# Patient Record
Sex: Female | Born: 1935
Health system: Southern US, Community
[De-identification: ages and names within clinical notes are randomized; demographics above are authoritative.]

## PROBLEM LIST (undated history)

## (undated) DIAGNOSIS — M810 Age-related osteoporosis without current pathological fracture: Secondary | ICD-10-CM

## (undated) DIAGNOSIS — I499 Cardiac arrhythmia, unspecified: Secondary | ICD-10-CM

## (undated) DIAGNOSIS — G4730 Sleep apnea, unspecified: Secondary | ICD-10-CM

## (undated) DIAGNOSIS — K900 Celiac disease: Secondary | ICD-10-CM

## (undated) DIAGNOSIS — K219 Gastro-esophageal reflux disease without esophagitis: Secondary | ICD-10-CM

## (undated) DIAGNOSIS — M419 Scoliosis, unspecified: Secondary | ICD-10-CM

## (undated) DIAGNOSIS — I4892 Unspecified atrial flutter: Secondary | ICD-10-CM

## (undated) DIAGNOSIS — M9951 Intervertebral disc stenosis of neural canal of cervical region: Secondary | ICD-10-CM

## (undated) DIAGNOSIS — M353 Polymyalgia rheumatica: Secondary | ICD-10-CM

## (undated) DIAGNOSIS — I10 Essential (primary) hypertension: Secondary | ICD-10-CM

## (undated) HISTORY — DX: Polymyalgia rheumatica: M35.3

## (undated) HISTORY — PX: APPENDECTOMY: SHX54

## (undated) HISTORY — DX: Sleep apnea, unspecified: G47.30

## (undated) HISTORY — DX: Cardiac arrhythmia, unspecified: I49.9

## (undated) HISTORY — DX: Unspecified atrial flutter: I48.92

---

## 1998-03-09 ENCOUNTER — Ambulatory Visit (HOSPITAL_COMMUNITY): Admission: RE | Admit: 1998-03-09 | Discharge: 1998-03-09 | Payer: Self-pay | Admitting: Gastroenterology

## 1998-04-12 ENCOUNTER — Ambulatory Visit (HOSPITAL_COMMUNITY): Admission: RE | Admit: 1998-04-12 | Discharge: 1998-04-12 | Payer: Self-pay | Admitting: Obstetrics & Gynecology

## 1998-04-16 ENCOUNTER — Ambulatory Visit (HOSPITAL_COMMUNITY): Admission: RE | Admit: 1998-04-16 | Discharge: 1998-04-16 | Payer: Self-pay | Admitting: Obstetrics & Gynecology

## 1999-03-19 ENCOUNTER — Ambulatory Visit (HOSPITAL_COMMUNITY): Admission: RE | Admit: 1999-03-19 | Discharge: 1999-03-19 | Payer: Self-pay | Admitting: Obstetrics & Gynecology

## 1999-06-17 ENCOUNTER — Ambulatory Visit (HOSPITAL_COMMUNITY): Admission: RE | Admit: 1999-06-17 | Discharge: 1999-06-17 | Payer: Self-pay | Admitting: Gastroenterology

## 1999-06-17 ENCOUNTER — Encounter (INDEPENDENT_AMBULATORY_CARE_PROVIDER_SITE_OTHER): Payer: Self-pay | Admitting: Specialist

## 1999-11-05 ENCOUNTER — Other Ambulatory Visit: Admission: RE | Admit: 1999-11-05 | Discharge: 1999-11-05 | Payer: Self-pay | Admitting: Obstetrics & Gynecology

## 2000-04-08 ENCOUNTER — Ambulatory Visit (HOSPITAL_COMMUNITY): Admission: RE | Admit: 2000-04-08 | Discharge: 2000-04-08 | Payer: Self-pay | Admitting: Obstetrics & Gynecology

## 2000-04-08 ENCOUNTER — Encounter: Payer: Self-pay | Admitting: Obstetrics & Gynecology

## 2000-04-30 ENCOUNTER — Other Ambulatory Visit: Admission: RE | Admit: 2000-04-30 | Discharge: 2000-04-30 | Payer: Self-pay | Admitting: Obstetrics & Gynecology

## 2001-04-13 ENCOUNTER — Encounter: Payer: Self-pay | Admitting: Obstetrics & Gynecology

## 2001-04-13 ENCOUNTER — Ambulatory Visit (HOSPITAL_COMMUNITY): Admission: RE | Admit: 2001-04-13 | Discharge: 2001-04-13 | Payer: Self-pay | Admitting: Obstetrics & Gynecology

## 2001-04-16 ENCOUNTER — Encounter: Admission: RE | Admit: 2001-04-16 | Discharge: 2001-04-16 | Payer: Self-pay | Admitting: Family Medicine

## 2001-04-16 ENCOUNTER — Encounter: Payer: Self-pay | Admitting: Obstetrics & Gynecology

## 2001-06-01 ENCOUNTER — Encounter (INDEPENDENT_AMBULATORY_CARE_PROVIDER_SITE_OTHER): Payer: Self-pay | Admitting: Specialist

## 2001-06-01 ENCOUNTER — Ambulatory Visit (HOSPITAL_COMMUNITY): Admission: RE | Admit: 2001-06-01 | Discharge: 2001-06-01 | Payer: Self-pay | Admitting: Gastroenterology

## 2001-06-09 ENCOUNTER — Other Ambulatory Visit: Admission: RE | Admit: 2001-06-09 | Discharge: 2001-06-09 | Payer: Self-pay | Admitting: Obstetrics & Gynecology

## 2001-09-09 ENCOUNTER — Encounter: Admission: RE | Admit: 2001-09-09 | Discharge: 2001-12-08 | Payer: Self-pay | Admitting: *Deleted

## 2002-04-14 ENCOUNTER — Ambulatory Visit (HOSPITAL_COMMUNITY): Admission: RE | Admit: 2002-04-14 | Discharge: 2002-04-14 | Payer: Self-pay | Admitting: Obstetrics & Gynecology

## 2002-04-14 ENCOUNTER — Encounter: Payer: Self-pay | Admitting: Obstetrics & Gynecology

## 2002-10-12 ENCOUNTER — Encounter: Admission: RE | Admit: 2002-10-12 | Discharge: 2002-10-12 | Payer: Self-pay | Admitting: Orthopaedic Surgery

## 2002-10-12 ENCOUNTER — Encounter: Payer: Self-pay | Admitting: Orthopaedic Surgery

## 2002-10-27 ENCOUNTER — Encounter: Payer: Self-pay | Admitting: Orthopaedic Surgery

## 2002-10-27 ENCOUNTER — Encounter: Admission: RE | Admit: 2002-10-27 | Discharge: 2002-10-27 | Payer: Self-pay | Admitting: Orthopaedic Surgery

## 2002-11-28 ENCOUNTER — Encounter: Payer: Self-pay | Admitting: Orthopaedic Surgery

## 2002-11-28 ENCOUNTER — Encounter: Admission: RE | Admit: 2002-11-28 | Discharge: 2002-11-28 | Payer: Self-pay | Admitting: Orthopaedic Surgery

## 2002-12-06 ENCOUNTER — Ambulatory Visit (HOSPITAL_COMMUNITY): Admission: RE | Admit: 2002-12-06 | Discharge: 2002-12-06 | Payer: Self-pay | Admitting: Family Medicine

## 2002-12-06 ENCOUNTER — Encounter: Payer: Self-pay | Admitting: Family Medicine

## 2003-04-17 ENCOUNTER — Ambulatory Visit (HOSPITAL_COMMUNITY): Admission: RE | Admit: 2003-04-17 | Discharge: 2003-04-17 | Payer: Self-pay | Admitting: Family Medicine

## 2003-04-17 ENCOUNTER — Encounter: Payer: Self-pay | Admitting: Obstetrics & Gynecology

## 2003-05-02 ENCOUNTER — Other Ambulatory Visit: Admission: RE | Admit: 2003-05-02 | Discharge: 2003-05-02 | Payer: Self-pay | Admitting: Obstetrics & Gynecology

## 2004-04-22 ENCOUNTER — Ambulatory Visit (HOSPITAL_COMMUNITY): Admission: RE | Admit: 2004-04-22 | Discharge: 2004-04-22 | Payer: Self-pay | Admitting: Obstetrics & Gynecology

## 2004-09-03 ENCOUNTER — Encounter: Admission: RE | Admit: 2004-09-03 | Discharge: 2004-09-03 | Payer: Self-pay | Admitting: Specialist

## 2005-04-24 ENCOUNTER — Ambulatory Visit (HOSPITAL_COMMUNITY): Admission: RE | Admit: 2005-04-24 | Discharge: 2005-04-24 | Payer: Self-pay | Admitting: Obstetrics & Gynecology

## 2005-06-05 ENCOUNTER — Other Ambulatory Visit: Admission: RE | Admit: 2005-06-05 | Discharge: 2005-06-05 | Payer: Self-pay | Admitting: Obstetrics & Gynecology

## 2006-01-27 HISTORY — PX: CARDIAC CATHETERIZATION: SHX172

## 2006-01-28 ENCOUNTER — Encounter: Payer: Self-pay | Admitting: Obstetrics & Gynecology

## 2006-05-07 ENCOUNTER — Ambulatory Visit (HOSPITAL_COMMUNITY): Admission: RE | Admit: 2006-05-07 | Discharge: 2006-05-07 | Payer: Self-pay | Admitting: Obstetrics & Gynecology

## 2007-05-11 ENCOUNTER — Ambulatory Visit (HOSPITAL_COMMUNITY): Admission: RE | Admit: 2007-05-11 | Discharge: 2007-05-11 | Payer: Self-pay | Admitting: Internal Medicine

## 2008-02-01 ENCOUNTER — Emergency Department (HOSPITAL_COMMUNITY): Admission: EM | Admit: 2008-02-01 | Discharge: 2008-02-01 | Payer: Self-pay | Admitting: Emergency Medicine

## 2008-05-11 ENCOUNTER — Ambulatory Visit (HOSPITAL_COMMUNITY): Admission: RE | Admit: 2008-05-11 | Discharge: 2008-05-11 | Payer: Self-pay | Admitting: Obstetrics & Gynecology

## 2009-05-14 ENCOUNTER — Ambulatory Visit (HOSPITAL_COMMUNITY): Admission: RE | Admit: 2009-05-14 | Discharge: 2009-05-14 | Payer: Self-pay | Admitting: Obstetrics & Gynecology

## 2010-04-12 ENCOUNTER — Ambulatory Visit (HOSPITAL_COMMUNITY): Admission: RE | Admit: 2010-04-12 | Discharge: 2010-04-12 | Payer: Self-pay | Admitting: Internal Medicine

## 2010-05-10 ENCOUNTER — Inpatient Hospital Stay (HOSPITAL_COMMUNITY): Admission: EM | Admit: 2010-05-10 | Discharge: 2010-05-13 | Payer: Self-pay | Admitting: Emergency Medicine

## 2010-10-20 ENCOUNTER — Encounter: Payer: Self-pay | Admitting: Obstetrics & Gynecology

## 2010-12-13 LAB — DIFFERENTIAL
Basophils Absolute: 0.1 10*3/uL (ref 0.0–0.1)
Basophils Relative: 1 % (ref 0–1)
Eosinophils Absolute: 0 10*3/uL (ref 0.0–0.7)
Eosinophils Relative: 0 % (ref 0–5)
Lymphocytes Relative: 19 % (ref 12–46)
Lymphs Abs: 2.2 10*3/uL (ref 0.7–4.0)
Monocytes Absolute: 1.5 10*3/uL — ABNORMAL HIGH (ref 0.1–1.0)
Monocytes Relative: 13 % — ABNORMAL HIGH (ref 3–12)
Neutro Abs: 7.8 10*3/uL — ABNORMAL HIGH (ref 1.7–7.7)
Neutrophils Relative %: 67 % (ref 43–77)

## 2010-12-13 LAB — COMPREHENSIVE METABOLIC PANEL
ALT: 18 U/L (ref 0–35)
AST: 18 U/L (ref 0–37)
Albumin: 3.3 g/dL — ABNORMAL LOW (ref 3.5–5.2)
Alkaline Phosphatase: 66 U/L (ref 39–117)
BUN: 11 mg/dL (ref 6–23)
CO2: 24 mEq/L (ref 19–32)
Calcium: 8.7 mg/dL (ref 8.4–10.5)
Chloride: 102 mEq/L (ref 96–112)
Creatinine, Ser: 0.77 mg/dL (ref 0.4–1.2)
GFR calc Af Amer: >60 mL/min (ref >60)
GFR calc non Af Amer: >60 mL/min (ref >60)
Glucose, Bld: 160 mg/dL — ABNORMAL HIGH (ref 70–99)
Potassium: 4.2 mEq/L (ref 3.5–5.1)
Sodium: 134 mEq/L — ABNORMAL LOW (ref 135–145)
Total Bilirubin: 0.6 mg/dL (ref 0.3–1.2)
Total Protein: 7.2 g/dL (ref 6.0–8.3)

## 2010-12-13 LAB — CBC
HCT: 39.2 % (ref 36.0–46.0)
Hemoglobin: 13.4 g/dL (ref 12.0–15.0)
MCH: 30.8 pg (ref 26.0–34.0)
MCHC: 34.1 g/dL (ref 30.0–36.0)
MCV: 90.3 fL (ref 78.0–100.0)
Platelets: 349 10*3/uL (ref 150–400)
RBC: 4.34 MIL/uL (ref 3.87–5.11)
RDW: 12.9 % (ref 11.5–15.5)
WBC: 11.6 10*3/uL — ABNORMAL HIGH (ref 4.0–10.5)

## 2010-12-13 LAB — C-REACTIVE PROTEIN: CRP: 21.2 mg/dL — ABNORMAL HIGH (ref <0.6)

## 2010-12-13 LAB — ANA
Anti Nuclear Antibody(ANA): POSITIVE — AB
Anti Nuclear Antibody(ANA): POSITIVE — AB

## 2010-12-13 LAB — C-PEPTIDE: C-Peptide: 2.05 ng/mL (ref 0.80–3.90)

## 2010-12-13 LAB — ANTI-NUCLEAR AB-TITER (ANA TITER)
ANA Titer 1: NEGATIVE
ANA Titer 1: NEGATIVE

## 2010-12-13 LAB — SEDIMENTATION RATE: Sed Rate: 59 mm/hr — ABNORMAL HIGH (ref 0–22)

## 2010-12-13 LAB — CYCLIC CITRUL PEPTIDE ANTIBODY, IGG
Cyclic Citrullin Peptide Ab: <2 U/mL (ref 0.0–5.0)
Cyclic Citrullin Peptide Ab: <2 U/mL (ref 0.0–5.0)

## 2010-12-13 LAB — URIC ACID: Uric Acid, Serum: 6.8 mg/dL (ref 2.4–7.0)

## 2010-12-13 LAB — RHEUMATOID FACTOR: Rheumatoid fact SerPl-aCnc: 26 IU/mL — ABNORMAL HIGH (ref 0–20)

## 2011-01-22 ENCOUNTER — Other Ambulatory Visit: Payer: Self-pay | Admitting: Gastroenterology

## 2011-01-22 DIAGNOSIS — R1011 Right upper quadrant pain: Secondary | ICD-10-CM

## 2011-01-22 DIAGNOSIS — K900 Celiac disease: Secondary | ICD-10-CM

## 2011-01-22 DIAGNOSIS — R1031 Right lower quadrant pain: Secondary | ICD-10-CM

## 2011-01-29 ENCOUNTER — Ambulatory Visit
Admission: RE | Admit: 2011-01-29 | Discharge: 2011-01-29 | Disposition: A | Discharge status: Home / Self Care | Payer: Medicare Other | Source: Ambulatory Visit | Attending: Gastroenterology | Admitting: Gastroenterology

## 2011-01-29 ENCOUNTER — Other Ambulatory Visit: Payer: Self-pay

## 2011-01-29 DIAGNOSIS — R1031 Right lower quadrant pain: Secondary | ICD-10-CM

## 2011-01-29 DIAGNOSIS — R1011 Right upper quadrant pain: Secondary | ICD-10-CM

## 2011-01-29 DIAGNOSIS — K900 Celiac disease: Secondary | ICD-10-CM

## 2011-01-29 MED ORDER — IOHEXOL 300 MG/ML  SOLN
100.0000 mL | Freq: Once | INTRAMUSCULAR | Status: AC | PRN
  Administered 2011-01-29: 100 mL via INTRAVENOUS

## 2011-05-21 ENCOUNTER — Encounter (HOSPITAL_COMMUNITY): Payer: Medicare Other | Attending: Internal Medicine

## 2011-05-21 DIAGNOSIS — M810 Age-related osteoporosis without current pathological fracture: Secondary | ICD-10-CM | POA: Insufficient documentation

## 2011-10-02 DIAGNOSIS — G4710 Hypersomnia, unspecified: Secondary | ICD-10-CM | POA: Diagnosis not present

## 2011-10-02 DIAGNOSIS — G4730 Sleep apnea, unspecified: Secondary | ICD-10-CM | POA: Diagnosis not present

## 2011-10-02 DIAGNOSIS — R404 Transient alteration of awareness: Secondary | ICD-10-CM | POA: Diagnosis not present

## 2011-10-02 DIAGNOSIS — G4733 Obstructive sleep apnea (adult) (pediatric): Secondary | ICD-10-CM | POA: Diagnosis not present

## 2011-10-13 DIAGNOSIS — K900 Celiac disease: Secondary | ICD-10-CM | POA: Diagnosis not present

## 2011-10-13 DIAGNOSIS — K5730 Diverticulosis of large intestine without perforation or abscess without bleeding: Secondary | ICD-10-CM | POA: Diagnosis not present

## 2011-10-13 DIAGNOSIS — D126 Benign neoplasm of colon, unspecified: Secondary | ICD-10-CM | POA: Diagnosis not present

## 2011-10-13 DIAGNOSIS — Z86010 Personal history of colonic polyps: Secondary | ICD-10-CM | POA: Diagnosis not present

## 2011-10-13 DIAGNOSIS — R630 Anorexia: Secondary | ICD-10-CM | POA: Diagnosis not present

## 2011-10-13 DIAGNOSIS — Z1211 Encounter for screening for malignant neoplasm of colon: Secondary | ICD-10-CM | POA: Diagnosis not present

## 2011-10-13 DIAGNOSIS — R634 Abnormal weight loss: Secondary | ICD-10-CM | POA: Diagnosis not present

## 2011-10-13 DIAGNOSIS — K449 Diaphragmatic hernia without obstruction or gangrene: Secondary | ICD-10-CM | POA: Diagnosis not present

## 2011-10-15 DIAGNOSIS — G4733 Obstructive sleep apnea (adult) (pediatric): Secondary | ICD-10-CM | POA: Diagnosis not present

## 2011-10-27 DIAGNOSIS — K900 Celiac disease: Secondary | ICD-10-CM | POA: Diagnosis not present

## 2011-10-29 DIAGNOSIS — K900 Celiac disease: Secondary | ICD-10-CM | POA: Diagnosis not present

## 2011-10-29 DIAGNOSIS — M353 Polymyalgia rheumatica: Secondary | ICD-10-CM | POA: Diagnosis not present

## 2011-10-29 DIAGNOSIS — M810 Age-related osteoporosis without current pathological fracture: Secondary | ICD-10-CM | POA: Diagnosis not present

## 2011-10-29 DIAGNOSIS — Z0000 Encounter for general adult medical examination without abnormal findings: Secondary | ICD-10-CM | POA: Diagnosis not present

## 2011-11-05 DIAGNOSIS — Z0000 Encounter for general adult medical examination without abnormal findings: Secondary | ICD-10-CM | POA: Diagnosis not present

## 2011-11-05 DIAGNOSIS — R82998 Other abnormal findings in urine: Secondary | ICD-10-CM | POA: Diagnosis not present

## 2011-11-05 DIAGNOSIS — M4120 Other idiopathic scoliosis, site unspecified: Secondary | ICD-10-CM | POA: Diagnosis not present

## 2011-11-05 DIAGNOSIS — F411 Generalized anxiety disorder: Secondary | ICD-10-CM | POA: Diagnosis not present

## 2011-11-05 DIAGNOSIS — J984 Other disorders of lung: Secondary | ICD-10-CM | POA: Diagnosis not present

## 2011-11-07 DIAGNOSIS — I4729 Other ventricular tachycardia: Secondary | ICD-10-CM | POA: Diagnosis not present

## 2011-11-07 DIAGNOSIS — I472 Ventricular tachycardia: Secondary | ICD-10-CM | POA: Diagnosis not present

## 2011-11-12 DIAGNOSIS — R0982 Postnasal drip: Secondary | ICD-10-CM | POA: Diagnosis not present

## 2011-11-12 DIAGNOSIS — J342 Deviated nasal septum: Secondary | ICD-10-CM | POA: Diagnosis not present

## 2011-11-12 DIAGNOSIS — R51 Headache: Secondary | ICD-10-CM | POA: Diagnosis not present

## 2011-11-20 DIAGNOSIS — G4733 Obstructive sleep apnea (adult) (pediatric): Secondary | ICD-10-CM | POA: Diagnosis not present

## 2012-01-23 DIAGNOSIS — K219 Gastro-esophageal reflux disease without esophagitis: Secondary | ICD-10-CM | POA: Diagnosis not present

## 2012-01-23 DIAGNOSIS — M353 Polymyalgia rheumatica: Secondary | ICD-10-CM | POA: Diagnosis not present

## 2012-01-23 DIAGNOSIS — J984 Other disorders of lung: Secondary | ICD-10-CM | POA: Diagnosis not present

## 2012-01-23 DIAGNOSIS — F411 Generalized anxiety disorder: Secondary | ICD-10-CM | POA: Diagnosis not present

## 2012-01-27 ENCOUNTER — Other Ambulatory Visit: Payer: Self-pay | Admitting: Internal Medicine

## 2012-01-27 DIAGNOSIS — J984 Other disorders of lung: Secondary | ICD-10-CM

## 2012-01-28 ENCOUNTER — Ambulatory Visit
Admission: RE | Admit: 2012-01-28 | Discharge: 2012-01-28 | Disposition: A | Discharge status: Home / Self Care | Payer: Medicare Other | Source: Ambulatory Visit | Attending: Internal Medicine | Admitting: Internal Medicine

## 2012-01-28 DIAGNOSIS — R911 Solitary pulmonary nodule: Secondary | ICD-10-CM | POA: Diagnosis not present

## 2012-01-28 DIAGNOSIS — J984 Other disorders of lung: Secondary | ICD-10-CM

## 2012-01-28 MED ORDER — IOHEXOL 300 MG/ML  SOLN
75.0000 mL | Freq: Once | INTRAMUSCULAR | Status: AC | PRN
  Administered 2012-01-28: 75 mL via INTRAVENOUS

## 2012-02-02 DIAGNOSIS — K900 Celiac disease: Secondary | ICD-10-CM | POA: Diagnosis not present

## 2012-02-20 ENCOUNTER — Institutional Professional Consult (permissible substitution): Payer: Medicare Other | Admitting: Pulmonary Disease

## 2012-02-20 DIAGNOSIS — G479 Sleep disorder, unspecified: Secondary | ICD-10-CM | POA: Diagnosis not present

## 2012-02-20 DIAGNOSIS — M6740 Ganglion, unspecified site: Secondary | ICD-10-CM | POA: Diagnosis not present

## 2012-02-20 DIAGNOSIS — M4120 Other idiopathic scoliosis, site unspecified: Secondary | ICD-10-CM | POA: Diagnosis not present

## 2012-02-25 DIAGNOSIS — M810 Age-related osteoporosis without current pathological fracture: Secondary | ICD-10-CM | POA: Diagnosis not present

## 2012-02-25 DIAGNOSIS — IMO0002 Reserved for concepts with insufficient information to code with codable children: Secondary | ICD-10-CM | POA: Diagnosis not present

## 2012-02-25 DIAGNOSIS — M353 Polymyalgia rheumatica: Secondary | ICD-10-CM | POA: Diagnosis not present

## 2012-03-05 DIAGNOSIS — M542 Cervicalgia: Secondary | ICD-10-CM | POA: Diagnosis not present

## 2012-03-18 DIAGNOSIS — G4733 Obstructive sleep apnea (adult) (pediatric): Secondary | ICD-10-CM | POA: Diagnosis not present

## 2012-03-18 DIAGNOSIS — I498 Other specified cardiac arrhythmias: Secondary | ICD-10-CM | POA: Diagnosis not present

## 2012-03-23 DIAGNOSIS — J0100 Acute maxillary sinusitis, unspecified: Secondary | ICD-10-CM | POA: Diagnosis not present

## 2012-04-08 DIAGNOSIS — G4733 Obstructive sleep apnea (adult) (pediatric): Secondary | ICD-10-CM | POA: Diagnosis not present

## 2012-04-08 DIAGNOSIS — G4710 Hypersomnia, unspecified: Secondary | ICD-10-CM | POA: Diagnosis not present

## 2012-04-08 DIAGNOSIS — G4730 Sleep apnea, unspecified: Secondary | ICD-10-CM | POA: Diagnosis not present

## 2012-04-08 DIAGNOSIS — L608 Other nail disorders: Secondary | ICD-10-CM | POA: Diagnosis not present

## 2012-04-08 DIAGNOSIS — L851 Acquired keratosis [keratoderma] palmaris et plantaris: Secondary | ICD-10-CM | POA: Diagnosis not present

## 2012-04-08 DIAGNOSIS — L821 Other seborrheic keratosis: Secondary | ICD-10-CM | POA: Diagnosis not present

## 2012-04-08 DIAGNOSIS — D485 Neoplasm of uncertain behavior of skin: Secondary | ICD-10-CM | POA: Diagnosis not present

## 2012-04-08 DIAGNOSIS — M7130 Other bursal cyst, unspecified site: Secondary | ICD-10-CM | POA: Diagnosis not present

## 2012-04-23 DIAGNOSIS — M353 Polymyalgia rheumatica: Secondary | ICD-10-CM | POA: Diagnosis not present

## 2012-04-23 DIAGNOSIS — M4120 Other idiopathic scoliosis, site unspecified: Secondary | ICD-10-CM | POA: Diagnosis not present

## 2012-04-23 DIAGNOSIS — J329 Chronic sinusitis, unspecified: Secondary | ICD-10-CM | POA: Diagnosis not present

## 2012-05-06 DIAGNOSIS — H40019 Open angle with borderline findings, low risk, unspecified eye: Secondary | ICD-10-CM | POA: Diagnosis not present

## 2012-05-06 DIAGNOSIS — H2510 Age-related nuclear cataract, unspecified eye: Secondary | ICD-10-CM | POA: Diagnosis not present

## 2012-05-06 DIAGNOSIS — H43399 Other vitreous opacities, unspecified eye: Secondary | ICD-10-CM | POA: Diagnosis not present

## 2012-05-06 DIAGNOSIS — H35729 Serous detachment of retinal pigment epithelium, unspecified eye: Secondary | ICD-10-CM | POA: Diagnosis not present

## 2012-05-06 DIAGNOSIS — H35369 Drusen (degenerative) of macula, unspecified eye: Secondary | ICD-10-CM | POA: Diagnosis not present

## 2012-05-11 DIAGNOSIS — J3489 Other specified disorders of nose and nasal sinuses: Secondary | ICD-10-CM | POA: Diagnosis not present

## 2012-05-11 DIAGNOSIS — R52 Pain, unspecified: Secondary | ICD-10-CM | POA: Diagnosis not present

## 2012-06-14 DIAGNOSIS — R002 Palpitations: Secondary | ICD-10-CM | POA: Diagnosis not present

## 2012-06-14 DIAGNOSIS — I4729 Other ventricular tachycardia: Secondary | ICD-10-CM | POA: Diagnosis not present

## 2012-06-14 DIAGNOSIS — I472 Ventricular tachycardia: Secondary | ICD-10-CM | POA: Diagnosis not present

## 2012-06-18 DIAGNOSIS — F430 Acute stress reaction: Secondary | ICD-10-CM | POA: Diagnosis not present

## 2012-06-18 DIAGNOSIS — M810 Age-related osteoporosis without current pathological fracture: Secondary | ICD-10-CM | POA: Diagnosis not present

## 2012-06-23 DIAGNOSIS — Z79899 Other long term (current) drug therapy: Secondary | ICD-10-CM | POA: Diagnosis not present

## 2012-07-01 DIAGNOSIS — H5319 Other subjective visual disturbances: Secondary | ICD-10-CM | POA: Diagnosis not present

## 2012-07-01 DIAGNOSIS — Z79899 Other long term (current) drug therapy: Secondary | ICD-10-CM | POA: Diagnosis not present

## 2012-07-01 DIAGNOSIS — R51 Headache: Secondary | ICD-10-CM | POA: Diagnosis not present

## 2012-07-01 DIAGNOSIS — M353 Polymyalgia rheumatica: Secondary | ICD-10-CM | POA: Diagnosis not present

## 2012-07-05 DIAGNOSIS — M542 Cervicalgia: Secondary | ICD-10-CM | POA: Diagnosis not present

## 2012-07-05 DIAGNOSIS — M353 Polymyalgia rheumatica: Secondary | ICD-10-CM | POA: Diagnosis not present

## 2012-07-05 DIAGNOSIS — IMO0001 Reserved for inherently not codable concepts without codable children: Secondary | ICD-10-CM | POA: Diagnosis not present

## 2012-07-12 ENCOUNTER — Encounter (HOSPITAL_COMMUNITY): Payer: Self-pay

## 2012-07-12 ENCOUNTER — Encounter (HOSPITAL_COMMUNITY)
Admission: RE | Admit: 2012-07-12 | Discharge: 2012-07-12 | Disposition: A | Discharge status: Home / Self Care | Payer: Medicare Other | Source: Ambulatory Visit | Attending: Family Medicine | Admitting: Family Medicine

## 2012-07-12 DIAGNOSIS — M810 Age-related osteoporosis without current pathological fracture: Secondary | ICD-10-CM | POA: Insufficient documentation

## 2012-07-12 HISTORY — DX: Scoliosis, unspecified: M41.9

## 2012-07-12 HISTORY — DX: Cardiac arrhythmia, unspecified: I49.9

## 2012-07-12 HISTORY — DX: Intervertebral disc stenosis of neural canal of cervical region: M99.51

## 2012-07-12 HISTORY — DX: Age-related osteoporosis without current pathological fracture: M81.0

## 2012-07-12 HISTORY — DX: Gastro-esophageal reflux disease without esophagitis: K21.9

## 2012-07-12 HISTORY — DX: Celiac disease: K90.0

## 2012-07-12 MED ORDER — ZOLEDRONIC ACID 5 MG/100ML IV SOLN
5.0000 mg | Freq: Once | INTRAVENOUS | Status: AC
  Administered 2012-07-12: 5 mg via INTRAVENOUS
  Filled 2012-07-12: qty 100

## 2012-07-12 MED ORDER — SODIUM CHLORIDE 0.9 % IV SOLN
INTRAVENOUS | Status: DC
  Administered 2012-07-12: 10:00:00 via INTRAVENOUS

## 2012-07-13 DIAGNOSIS — J310 Chronic rhinitis: Secondary | ICD-10-CM | POA: Diagnosis not present

## 2012-07-23 DIAGNOSIS — M810 Age-related osteoporosis without current pathological fracture: Secondary | ICD-10-CM | POA: Diagnosis not present

## 2012-07-23 DIAGNOSIS — F411 Generalized anxiety disorder: Secondary | ICD-10-CM | POA: Diagnosis not present

## 2012-07-23 DIAGNOSIS — J3489 Other specified disorders of nose and nasal sinuses: Secondary | ICD-10-CM | POA: Diagnosis not present

## 2012-07-23 DIAGNOSIS — R252 Cramp and spasm: Secondary | ICD-10-CM | POA: Diagnosis not present

## 2012-08-12 DIAGNOSIS — H40019 Open angle with borderline findings, low risk, unspecified eye: Secondary | ICD-10-CM | POA: Diagnosis not present

## 2012-08-16 DIAGNOSIS — Z130 Encounter for screening for diseases of the blood and blood-forming organs and certain disorders involving the immune mechanism: Secondary | ICD-10-CM | POA: Diagnosis not present

## 2012-08-16 DIAGNOSIS — B373 Candidiasis of vulva and vagina: Secondary | ICD-10-CM | POA: Diagnosis not present

## 2012-08-16 DIAGNOSIS — Z124 Encounter for screening for malignant neoplasm of cervix: Secondary | ICD-10-CM | POA: Diagnosis not present

## 2012-08-16 DIAGNOSIS — Z1231 Encounter for screening mammogram for malignant neoplasm of breast: Secondary | ICD-10-CM | POA: Diagnosis not present

## 2012-08-16 DIAGNOSIS — Z01419 Encounter for gynecological examination (general) (routine) without abnormal findings: Secondary | ICD-10-CM | POA: Diagnosis not present

## 2012-09-30 DIAGNOSIS — M5137 Other intervertebral disc degeneration, lumbosacral region: Secondary | ICD-10-CM | POA: Diagnosis not present

## 2012-09-30 DIAGNOSIS — G894 Chronic pain syndrome: Secondary | ICD-10-CM | POA: Diagnosis not present

## 2012-10-04 DIAGNOSIS — R151 Fecal smearing: Secondary | ICD-10-CM | POA: Diagnosis not present

## 2012-10-04 DIAGNOSIS — R152 Fecal urgency: Secondary | ICD-10-CM | POA: Diagnosis not present

## 2012-10-04 DIAGNOSIS — K648 Other hemorrhoids: Secondary | ICD-10-CM | POA: Diagnosis not present

## 2012-10-08 DIAGNOSIS — H6980 Other specified disorders of Eustachian tube, unspecified ear: Secondary | ICD-10-CM | POA: Diagnosis not present

## 2012-10-08 DIAGNOSIS — G479 Sleep disorder, unspecified: Secondary | ICD-10-CM | POA: Diagnosis not present

## 2012-10-08 DIAGNOSIS — M810 Age-related osteoporosis without current pathological fracture: Secondary | ICD-10-CM | POA: Diagnosis not present

## 2012-10-08 DIAGNOSIS — M4120 Other idiopathic scoliosis, site unspecified: Secondary | ICD-10-CM | POA: Diagnosis not present

## 2012-10-08 DIAGNOSIS — F411 Generalized anxiety disorder: Secondary | ICD-10-CM | POA: Diagnosis not present

## 2012-10-08 DIAGNOSIS — M542 Cervicalgia: Secondary | ICD-10-CM | POA: Diagnosis not present

## 2012-10-11 DIAGNOSIS — IMO0001 Reserved for inherently not codable concepts without codable children: Secondary | ICD-10-CM | POA: Diagnosis not present

## 2012-10-11 DIAGNOSIS — IMO0002 Reserved for concepts with insufficient information to code with codable children: Secondary | ICD-10-CM | POA: Diagnosis not present

## 2012-10-11 DIAGNOSIS — M353 Polymyalgia rheumatica: Secondary | ICD-10-CM | POA: Diagnosis not present

## 2012-10-11 DIAGNOSIS — M810 Age-related osteoporosis without current pathological fracture: Secondary | ICD-10-CM | POA: Diagnosis not present

## 2012-10-22 DIAGNOSIS — G4733 Obstructive sleep apnea (adult) (pediatric): Secondary | ICD-10-CM | POA: Diagnosis not present

## 2012-10-22 DIAGNOSIS — R002 Palpitations: Secondary | ICD-10-CM | POA: Diagnosis not present

## 2012-11-29 ENCOUNTER — Emergency Department (HOSPITAL_COMMUNITY): Payer: Medicare Other

## 2012-11-29 ENCOUNTER — Inpatient Hospital Stay (HOSPITAL_COMMUNITY)
Admission: EM | Admit: 2012-11-29 | Discharge: 2012-11-30 | DRG: 310 | Disposition: A | Discharge status: Home / Self Care | Payer: Medicare Other | Attending: Internal Medicine | Admitting: Internal Medicine

## 2012-11-29 ENCOUNTER — Encounter (HOSPITAL_COMMUNITY): Payer: Self-pay | Admitting: Emergency Medicine

## 2012-11-29 DIAGNOSIS — I4891 Unspecified atrial fibrillation: Secondary | ICD-10-CM | POA: Diagnosis present

## 2012-11-29 DIAGNOSIS — M4802 Spinal stenosis, cervical region: Secondary | ICD-10-CM | POA: Diagnosis present

## 2012-11-29 DIAGNOSIS — J9819 Other pulmonary collapse: Secondary | ICD-10-CM | POA: Diagnosis not present

## 2012-11-29 DIAGNOSIS — K219 Gastro-esophageal reflux disease without esophagitis: Secondary | ICD-10-CM | POA: Diagnosis present

## 2012-11-29 DIAGNOSIS — IMO0002 Reserved for concepts with insufficient information to code with codable children: Secondary | ICD-10-CM

## 2012-11-29 DIAGNOSIS — R0789 Other chest pain: Secondary | ICD-10-CM | POA: Diagnosis not present

## 2012-11-29 DIAGNOSIS — M4120 Other idiopathic scoliosis, site unspecified: Secondary | ICD-10-CM | POA: Diagnosis present

## 2012-11-29 DIAGNOSIS — IMO0001 Reserved for inherently not codable concepts without codable children: Secondary | ICD-10-CM | POA: Diagnosis present

## 2012-11-29 DIAGNOSIS — M810 Age-related osteoporosis without current pathological fracture: Secondary | ICD-10-CM | POA: Diagnosis present

## 2012-11-29 DIAGNOSIS — I4892 Unspecified atrial flutter: Secondary | ICD-10-CM | POA: Diagnosis not present

## 2012-11-29 DIAGNOSIS — M797 Fibromyalgia: Secondary | ICD-10-CM

## 2012-11-29 DIAGNOSIS — I484 Atypical atrial flutter: Secondary | ICD-10-CM | POA: Diagnosis present

## 2012-11-29 DIAGNOSIS — I498 Other specified cardiac arrhythmias: Secondary | ICD-10-CM | POA: Diagnosis not present

## 2012-11-29 DIAGNOSIS — R6889 Other general symptoms and signs: Secondary | ICD-10-CM | POA: Diagnosis not present

## 2012-11-29 LAB — CBC
HCT: 38.2 % (ref 36.0–46.0)
Hemoglobin: 13.5 g/dL (ref 12.0–15.0)
MCH: 31.5 pg (ref 26.0–34.0)
MCHC: 35.3 g/dL (ref 30.0–36.0)
MCV: 89.3 fL (ref 78.0–100.0)
Platelets: 264 10*3/uL (ref 150–400)
RBC: 4.28 MIL/uL (ref 3.87–5.11)
RDW: 13.7 % (ref 11.5–15.5)
WBC: 11.4 10*3/uL — ABNORMAL HIGH (ref 4.0–10.5)

## 2012-11-29 LAB — CBC WITH DIFFERENTIAL/PLATELET
Basophils Absolute: 0 10*3/uL (ref 0.0–0.1)
Basophils Relative: 0 % (ref 0–1)
Eosinophils Absolute: 0 10*3/uL (ref 0.0–0.7)
Eosinophils Relative: 0 % (ref 0–5)
HCT: 37.1 % (ref 36.0–46.0)
Hemoglobin: 13 g/dL (ref 12.0–15.0)
Lymphocytes Relative: 17 % (ref 12–46)
Lymphs Abs: 1.9 10*3/uL (ref 0.7–4.0)
MCH: 31.3 pg (ref 26.0–34.0)
MCHC: 35 g/dL (ref 30.0–36.0)
MCV: 89.4 fL (ref 78.0–100.0)
Monocytes Absolute: 0.8 10*3/uL (ref 0.1–1.0)
Monocytes Relative: 7 % (ref 3–12)
Neutro Abs: 8.4 10*3/uL — ABNORMAL HIGH (ref 1.7–7.7)
Neutrophils Relative %: 75 % (ref 43–77)
Platelets: 258 10*3/uL (ref 150–400)
RBC: 4.15 MIL/uL (ref 3.87–5.11)
RDW: 13.6 % (ref 11.5–15.5)
WBC: 11.2 10*3/uL — ABNORMAL HIGH (ref 4.0–10.5)

## 2012-11-29 LAB — BASIC METABOLIC PANEL
BUN: 14 mg/dL (ref 6–23)
CO2: 26 mEq/L (ref 19–32)
Calcium: 10.2 mg/dL (ref 8.4–10.5)
Chloride: 101 mEq/L (ref 96–112)
Creatinine, Ser: 0.78 mg/dL (ref 0.50–1.10)
GFR calc Af Amer: >90 mL/min (ref >90)
GFR calc non Af Amer: 78 mL/min — ABNORMAL LOW (ref >90)
Glucose, Bld: 106 mg/dL — ABNORMAL HIGH (ref 70–99)
Potassium: 4.1 mEq/L (ref 3.5–5.1)
Sodium: 139 mEq/L (ref 135–145)

## 2012-11-29 LAB — TROPONIN I
Troponin I: <0.3 ng/mL (ref <0.30)
Troponin I: <0.3 ng/mL (ref <0.30)

## 2012-11-29 LAB — CREATININE, SERUM
Creatinine, Ser: 0.79 mg/dL (ref 0.50–1.10)
GFR calc Af Amer: >90 mL/min (ref >90)
GFR calc non Af Amer: 78 mL/min — ABNORMAL LOW (ref >90)

## 2012-11-29 LAB — PRO B NATRIURETIC PEPTIDE: Pro B Natriuretic peptide (BNP): 178.6 pg/mL (ref 0–450)

## 2012-11-29 MED ORDER — METOPROLOL TARTRATE 25 MG PO TABS
25.0000 mg | ORAL_TABLET | Freq: Two times a day (BID) | ORAL | Status: DC
  Administered 2012-11-29 – 2012-11-30 (×2): 25 mg via ORAL
  Filled 2012-11-29 (×3): qty 1

## 2012-11-29 MED ORDER — SODIUM CHLORIDE 0.9 % IJ SOLN
3.0000 mL | Freq: Two times a day (BID) | INTRAMUSCULAR | Status: DC
  Administered 2012-11-29: 3 mL via INTRAVENOUS

## 2012-11-29 MED ORDER — DOCUSATE SODIUM 100 MG PO CAPS
100.0000 mg | ORAL_CAPSULE | Freq: Two times a day (BID) | ORAL | Status: DC
  Administered 2012-11-29: 100 mg via ORAL
  Filled 2012-11-29 (×3): qty 1

## 2012-11-29 MED ORDER — ZOLPIDEM TARTRATE 5 MG PO TABS
5.0000 mg | ORAL_TABLET | Freq: Every evening | ORAL | Status: DC | PRN
  Filled 2012-11-29: qty 1

## 2012-11-29 MED ORDER — ASPIRIN EC 325 MG PO TBEC
325.0000 mg | DELAYED_RELEASE_TABLET | Freq: Every day | ORAL | Status: DC
  Administered 2012-11-30: 325 mg via ORAL
  Filled 2012-11-29: qty 1

## 2012-11-29 MED ORDER — PREDNISONE 20 MG PO TABS
20.0000 mg | ORAL_TABLET | Freq: Two times a day (BID) | ORAL | Status: DC
  Administered 2012-11-30: 20 mg via ORAL
  Filled 2012-11-29 (×3): qty 1

## 2012-11-29 MED ORDER — SODIUM CHLORIDE 0.9 % IJ SOLN: 3.0000 mL | INTRAMUSCULAR | Status: DC | PRN

## 2012-11-29 MED ORDER — SODIUM CHLORIDE 0.9 % IJ SOLN: 3.0000 mL | Freq: Two times a day (BID) | INTRAMUSCULAR | Status: DC

## 2012-11-29 MED ORDER — SODIUM CHLORIDE 0.9 % IV SOLN: 250.0000 mL | INTRAVENOUS | Status: DC | PRN

## 2012-11-29 MED ORDER — ENOXAPARIN SODIUM 40 MG/0.4ML ~~LOC~~ SOLN
40.0000 mg | SUBCUTANEOUS | Status: DC
  Administered 2012-11-29: 40 mg via SUBCUTANEOUS
  Filled 2012-11-29 (×2): qty 0.4

## 2012-11-29 NOTE — H&P (Signed)
[facility] History and Physical  Janet Nguyen JFH:[id number] DOB: 10-Aug-1932    PCP:   Gus Height, MD   Chief Complaint: chest tightness and palpitation.  HPI: Janet Nguyen is an 79 y.o. female with hx of "irregular heart beat" ? PAF, celiac disease, fibromyalgia on chronic intermittent steroid, scoliosis, GERD, but no known CAD, presents to the ER via ambulance with complaining of palpitation.  She felt her heart was pounding.  She denied SOB, but admitted to having substernal chest pressure.  She said it felt like an "elephant" on her chest.  There were no nausea or vomiting.  She was found to be in aflutter with 2:1 block at rate of 150.  She spontaneously converted to NSR when she was placed on the ambulance.  Her tachyarythmia did not recur in the ER.  Her EKG showed NSR with no acute ST-T changes.  CXR was negative.  Her troponin was negative and renal fx tests were unremarkable.  She has no anemia.  Hospitalist was asked to admit her for chest pain and aflutter with RVR.  Rewiew of Systems:  Constitutional: Negative for malaise, fever and chills. No significant weight loss or weight gain Eyes: Negative for eye pain, redness and discharge, diplopia, visual changes, or flashes of light. ENMT: Negative for ear pain, hoarseness, nasal congestion, sinus pressure and sore throat. No headaches; tinnitus, drooling, or problem swallowing. Cardiovascular: Negative for  diaphoresis, dyspnea and peripheral edema. ; No orthopnea, PND Respiratory: Negative for cough, hemoptysis, wheezing and stridor. No pleuritic chestpain. Gastrointestinal: Negative for nausea, vomiting, diarrhea, constipation, abdominal pain, melena, blood in stool, hematemesis, jaundice and rectal bleeding.    Genitourinary: Negative for frequency, dysuria, incontinence,flank pain and hematuria; Musculoskeletal: Negative for back pain and neck pain. Negative for swelling and trauma.;  Skin: . Negative for pruritus,  rash, abrasions, bruising and skin lesion.; ulcerations Neuro: Negative for headache, lightheadedness and neck stiffness. Negative for weakness, altered level of consciousness , altered mental status, extremity weakness, burning feet, involuntary movement, seizure and syncope.  Psych: negative for anxiety, depression, insomnia, tearfulness, panic attacks, hallucinations, paranoia, suicidal or homicidal ideation    Past Medical History  Diagnosis Date  . Irregular heart beat   . Celiac disease   . GERD (gastroesophageal reflux disease)   . Osteoporosis   . Scoliosis   . Intervertebral disc stenosis of neural canal of cervical region     Past Surgical History  Procedure Laterality Date  . Appendectomy      ruptured at age 63 and had surgery    Medications:  HOME MEDS: Prior to Admission medications   Medication Sig Start Date End Date Taking? Authorizing Provider  metoprolol tartrate (LOPRESSOR) 25 MG tablet Take 25 mg by mouth 2 (two) times daily.   Yes Historical Provider, MD  Multiple Vitamin (MULTIVITAMIN WITH MINERALS) TABS Take 1 tablet by mouth daily.   Yes Historical Provider, MD  predniSONE (DELTASONE) 20 MG tablet Take 20 mg by mouth 2 (two) times daily.   Yes Historical Provider, MD  zolpidem (AMBIEN) 5 MG tablet Take 5 mg by mouth at bedtime as needed for sleep.   Yes Historical Provider, MD     Allergies:  Allergies  Allergen Reactions  . Codeine Nausea Only  . Gluten Meal     Unknown    Social History:   reports that she has never smoked. She does not have any smokeless tobacco history on file. She reports that she drinks about 0.5 ounces of  alcohol per week. She reports that she does not use illicit drugs.  Family History: No family history on file.   Physical Exam: Filed Vitals:   11/29/12 1704 11/29/12 1716 11/29/12 1923  BP:  147/81 169/77  Pulse:  73 76  Temp:  98.8 F (37.1 C) 98.5 F (36.9 C)  TempSrc:  Oral Oral  Resp:  15 14  SpO2: 96%   97%   Blood pressure 169/77, pulse 76, temperature 98.5 F (36.9 C), temperature source Oral, resp. rate 14, SpO2 97.00%.  GEN:  Pleasant  patient lying in the stretcher in no acute distress; cooperative with exam. PSYCH:  alert and oriented x4; does not appear anxious or depressed; affect is appropriate. HEENT: Mucous membranes pink and anicteric; PERRLA; EOM intact; no cervical lymphadenopathy nor thyromegaly or carotid bruit; no JVD; There were no stridor. Neck is very supple. Breasts:: Not examined CHEST WALL: No tenderness CHEST: Normal respiration, clear to auscultation bilaterally.  HEART: Regular rate and rhythm.  There are no murmur, rub, or gallops.   BACK: No kyphosis or scoliosis; no CVA tenderness ABDOMEN: soft and non-tender; no masses, no organomegaly, normal abdominal bowel sounds; no pannus; no intertriginous candida. There is no rebound and no distention. Rectal Exam: Not done EXTREMITIES: No bone or joint deformity; age-appropriate arthropathy of the hands and knees; no edema; no ulcerations.  There is no calf tenderness. Genitalia: not examined PULSES: 2+ and symmetric SKIN: Normal hydration no rash or ulceration CNS: Cranial nerves 2-12 grossly intact no focal lateralizing neurologic deficit.  Speech is fluent; uvula elevated with phonation, facial symmetry and tongue midline. DTR are normal bilaterally, cerebella exam is intact, barbinski is negative and strengths are equaled bilaterally.  No sensory loss.   Labs on Admission:  Basic Metabolic Panel:  Recent Labs Lab 11/29/12 1736  NA 139  K 4.1  CL 101  CO2 26  GLUCOSE 106*  BUN 14  CREATININE 0.78  CALCIUM 10.2   Liver Function Tests: No results found for this basename: AST, ALT, ALKPHOS, BILITOT, PROT, ALBUMIN,  in the last 168 hours No results found for this basename: LIPASE, AMYLASE,  in the last 168 hours No results found for this basename: AMMONIA,  in the last 168 hours CBC:  Recent Labs Lab  11/29/12 1736  WBC 11.2*  NEUTROABS 8.4*  HGB 13.0  HCT 37.1  MCV 89.4  PLT 258   Cardiac Enzymes:  Recent Labs Lab 11/29/12 1736  TROPONINI <0.30    CBG: No results found for this basename: GLUCAP,  in the last 168 hours   Radiological Exams on Admission: Dg Chest Portable 1 View  11/29/2012  *RADIOLOGY REPORT*  Clinical Data: Heart palpitations.  PORTABLE CHEST - 1 VIEW  Comparison: CT chest 01/28/2012.  Findings: Lung volumes are low with mild basilar atelectasis. Heart size is normal.  No pneumothorax or pleural effusion.  Convex right scoliosis noted.  IMPRESSION: No acute disease.   Original Report Authenticated By: Orlean Patten, M.D.     EKG: Independently reviewed. SR without any acute ST-T changes.   Assessment/Plan Present on Admission:  . Atrial flutter with rapid ventricular response . Chest pain, atypical . Fibromyalgia syndrome Chronic steroid use.  PLAN:  She had PAF or flutter and was in RVR, but she is now in NSR.  Her CHADS score is low (1=age 80), and I will place her on ASA.  She said she bruises easily.  She is on low dose betablocker and I will continue  it as well.  She will need an ECHO, and be r/out with serial troponins.  She is on chronic steroid and we'll continue it.  She has no signs and symptoms of acute adrenal insufficiency.  She did have chest pain when her HR was 150, so she will need a stress test as well.  She is stable, full code, and will be admitted to [facility] service.  Thank you for allowing me to partake in the care of your patient.  Other plans as per orders.  Code Status: FULL Haskel Khan, MD. [facility] Pager [phone] 7pm to 7am.  11/29/2012, 8:26 PM

## 2012-11-29 NOTE — ED Notes (Signed)
EMS called for palpations. EMS arrived HR 155 converted when EMS moved patient to stretcher. Currently chest pressure 4/10.

## 2012-11-29 NOTE — ED Provider Notes (Signed)
I saw and evaluated the patient, reviewed the resident's note and I agree with the findings and plan. I reviewed and interpreted the EKG during the patient's evaluation in the ED and agree with the resident's interpretation.  Pt's EMS EKG showed a flutter with 2:1 block.  Suspect her pain may have been related to her tachycardia.  Plan on admission for further evaluation.  Cardiac monitoring.  Kathalene Frames, MD 11/30/12 0000

## 2012-11-29 NOTE — ED Notes (Signed)
EKG completed 1710

## 2012-11-29 NOTE — ED Provider Notes (Signed)
History     CSN: [id number]  Arrival date & time 11/29/12  1704   First MD Initiated Contact with Patient 11/29/12 1705      Chief Complaint  Patient presents with  . Palpitations    (Consider location/radiation/quality/duration/timing/severity/associated sxs/prior treatment) Patient is a 79 y.o. female presenting with palpitations. The history is provided by the patient. No language interpreter was used.  Palpitations  This is a new problem. The current episode started less than 1 hour ago. The problem occurs constantly. The problem has been resolved. The problem is associated with an unknown factor. On average, each episode lasts 40 minutes. Associated symptoms include chest pain and chest pressure. Pertinent negatives include no diaphoresis, no fever, no numbness, no abdominal pain, no nausea, no vomiting, no headaches, no back pain, no leg pain, no dizziness, no weakness, no cough and no shortness of breath. She has tried nothing for the symptoms. The treatment provided significant relief. There are no known risk factors.    Past Medical History  Diagnosis Date  . Irregular heart beat   . Celiac disease   . GERD (gastroesophageal reflux disease)   . Osteoporosis   . Scoliosis   . Intervertebral disc stenosis of neural canal of cervical region     Past Surgical History  Procedure Laterality Date  . Appendectomy      ruptured at age 25 and had surgery    No family history on file.  History  Substance Use Topics  . Smoking status: Never Smoker   . Smokeless tobacco: Not on file  . Alcohol Use: 0.5 oz/week    1 drink(s) per week    OB History   Grav Para Term Preterm Abortions TAB SAB Ect Mult Living                  Review of Systems  Constitutional: Negative for fever, chills, diaphoresis, activity change, appetite change and fatigue.  HENT: Negative for congestion, sore throat, facial swelling, rhinorrhea, neck pain and neck stiffness.   Eyes: Negative for  photophobia and discharge.  Respiratory: Negative for cough, chest tightness and shortness of breath.   Cardiovascular: Positive for chest pain and palpitations. Negative for leg swelling.  Gastrointestinal: Negative for nausea, vomiting, abdominal pain and diarrhea.  Endocrine: Negative for polydipsia and polyuria.  Genitourinary: Negative for dysuria, frequency, difficulty urinating and pelvic pain.  Musculoskeletal: Negative for back pain and arthralgias.  Skin: Negative for color change and wound.  Allergic/Immunologic: Negative for immunocompromised state.  Neurological: Negative for dizziness, facial asymmetry, weakness, numbness and headaches.  Hematological: Does not bruise/bleed easily.  Psychiatric/Behavioral: Negative for confusion and agitation.    Allergies  Codeine and Gluten meal  Home Medications   No current outpatient prescriptions on file.  BP 165/86  Pulse 71  Temp(Src) 98.1 F (36.7 C) (Oral)  Resp 16  Ht 5' 3"  (1.6 m)  Wt 125 lb 1.6 oz (56.745 kg)  BMI 22.17 kg/m2  SpO2 97%  Physical Exam  Constitutional: She is oriented to person, place, and time. She appears well-developed and well-nourished. No distress.  HENT:  Head: Normocephalic and atraumatic.  Mouth/Throat: No oropharyngeal exudate.  Eyes: Pupils are equal, round, and reactive to light.  Neck: Normal range of motion. Neck supple.  Cardiovascular: Normal rate, regular rhythm and normal heart sounds.  Exam reveals no gallop and no friction rub.   No murmur heard. Pulmonary/Chest: Effort normal and breath sounds normal. No respiratory distress. She has no wheezes. She  has no rales.  Abdominal: Soft. Bowel sounds are normal. She exhibits no distension and no mass. There is no tenderness. There is no rebound and no guarding.  Musculoskeletal: Normal range of motion. She exhibits no edema and no tenderness.  Neurological: She is alert and oriented to person, place, and time.  Skin: Skin is warm and  dry.  Psychiatric: She has a normal mood and affect.    ED Course  Procedures (including critical care time)  Labs Reviewed  CBC WITH DIFFERENTIAL - Abnormal; Notable for the following:    WBC 11.2 (*)    Neutro Abs 8.4 (*)    All other components within normal limits  BASIC METABOLIC PANEL - Abnormal; Notable for the following:    Glucose, Bld 106 (*)    GFR calc non Af Amer 78 (*)    All other components within normal limits  CBC - Abnormal; Notable for the following:    WBC 11.4 (*)    All other components within normal limits  TROPONIN I  PRO B NATRIURETIC PEPTIDE  CREATININE, SERUM  TSH  TROPONIN I  TROPONIN I  TROPONIN I   Dg Chest Portable 1 View  11/29/2012  *RADIOLOGY REPORT*  Clinical Data: Heart palpitations.  PORTABLE CHEST - 1 VIEW  Comparison: CT chest 01/28/2012.  Findings: Lung volumes are low with mild basilar atelectasis. Heart size is normal.  No pneumothorax or pleural effusion.  Convex right scoliosis noted.  IMPRESSION: No acute disease.   Original Report Authenticated By: Orlean Patten, M.D.      1. Atrial flutter   2. Atrial flutter with rapid ventricular response   3. Chest pain, atypical   4. Chronic steroid use      Date: 11/29/2012  Rate: 74  Rhythm: sinus  QRS Axis: normal  Intervals: normal  ST/T Wave abnormalities: nonspecific T wave changes, TWI I, aVL  Conduction Disutrbances:none  Narrative Interpretation:   Old EKG Reviewed: none available    MDM  Pt is a 79 y.o. female with pertinent PMHX of irregular heart beat (afib?), GERD, celiac, scoliosis who presents with about 30-40 mins of palpitations, and chest pressure.  EMS called and pt found to be in a narrow complex tachyarrhythmia w/ rate about 150.  Pt spontaneously converted prior to arrival and arrives in sinus rhythm w/ rate in 70's.  Pt reports she has had several months of upper chest/truck pain, but assumed it was due to her scoliosis.  Chest pain was worse during episode  of palpitations.  She is still having CP now, but is unable to say if this is similar to her daily pain.  PE unremarkable except for scoliosis.  Concern for a flutter w/ 2:1 block, SVT, based on rhythms strip, although given associated CP concern for ACS or demand ischemia.  First troponin negative, CBC, BMP unremarkable, CXR w/ nml cardiac size.  Pt sees [facility].  Will admit to [facility] for observation.    1. Atrial flutter   2. Atrial flutter with rapid ventricular response   3. Chest pain, atypical   4. Chronic steroid use     Labs and imaging considered in decision making, reviewed by myself.  Imaging interpreted by radiology. Pt care discussed with my attending, Dr. Tomi Bamberger.    Ernestina Patches, MD 11/29/12 [phone]

## 2012-11-29 NOTE — ED Notes (Signed)
Pt ambulated to restroom without difficulty. Obtained urine specimen if needed.

## 2012-11-30 ENCOUNTER — Other Ambulatory Visit: Payer: Self-pay | Admitting: Cardiology

## 2012-11-30 DIAGNOSIS — I498 Other specified cardiac arrhythmias: Secondary | ICD-10-CM | POA: Diagnosis not present

## 2012-11-30 DIAGNOSIS — IMO0002 Reserved for concepts with insufficient information to code with codable children: Secondary | ICD-10-CM | POA: Diagnosis not present

## 2012-11-30 DIAGNOSIS — I499 Cardiac arrhythmia, unspecified: Secondary | ICD-10-CM

## 2012-11-30 DIAGNOSIS — R079 Chest pain, unspecified: Secondary | ICD-10-CM

## 2012-11-30 DIAGNOSIS — R0789 Other chest pain: Secondary | ICD-10-CM | POA: Diagnosis not present

## 2012-11-30 DIAGNOSIS — I4892 Unspecified atrial flutter: Secondary | ICD-10-CM | POA: Diagnosis not present

## 2012-11-30 HISTORY — DX: Cardiac arrhythmia, unspecified: I49.9

## 2012-11-30 LAB — TSH: TSH: 1.612 u[IU]/mL (ref 0.350–4.500)

## 2012-11-30 LAB — TROPONIN I
Troponin I: <0.3 ng/mL (ref <0.30)
Troponin I: <0.3 ng/mL (ref <0.30)

## 2012-11-30 MED ORDER — METOPROLOL TARTRATE 25 MG PO TABS: 37.5000 mg | ORAL_TABLET | Freq: Two times a day (BID) | ORAL | Status: DC

## 2012-11-30 MED ORDER — ACETAMINOPHEN 325 MG PO TABS: 650.0000 mg | ORAL_TABLET | ORAL | Status: DC | PRN

## 2012-11-30 MED ORDER — METOPROLOL TARTRATE 25 MG PO TABS
25.0000 mg | ORAL_TABLET | Freq: Two times a day (BID) | ORAL | Status: DC
  Filled 2012-11-30: qty 1

## 2012-11-30 MED ORDER — METOPROLOL TARTRATE 25 MG PO TABS
37.5000 mg | ORAL_TABLET | Freq: Two times a day (BID) | ORAL | Status: DC
  Filled 2012-11-30: qty 1

## 2012-11-30 NOTE — Progress Notes (Signed)
Discharged to home with family office visits in place teaching done  

## 2012-11-30 NOTE — Progress Notes (Signed)
Utilization Review Completed.Donne Anon T3/12/2012

## 2012-11-30 NOTE — Discharge Summary (Signed)
Physician Discharge Summary  Janet Nguyen ZOX:[id number] DOB: [birth year] DOA: 11/29/2012  PCP: Gus Height, MD  Admit date: 11/29/2012 Discharge date: 11/30/2012  Recommendations for Outpatient Follow-up:  1. Pt will need to follow up with PCP in 2-3 weeks post discharge 2. Please obtain BMP to evaluate electrolytes and kidney function 3. Please also check CBC to evaluate Hg and Hct levels 4. Please note that the dose of Metoprolol ws increased to 37.5 mg BID per cardiology recommendation 5. Needs follow up on 2 D ECHO as the results still pending on discharge   Discharge Diagnoses: Atrial flutter with RVR, resolved  Principal Problem:   Atrial flutter with rapid ventricular response Active Problems:   Chest pain, atypical   Fibromyalgia syndrome   Chronic steroid use  Discharge Condition: Stable  Diet recommendation: Heart healthy diet discussed in details   History of present illness:  Pt is 79 yo with history of SVT, presented [facility] ED with main concern of substernal pressure that started an evening prior to admission, associated with shortness of breath and palpitations. She denies similar events in the past, no abdominal or urinary concerns, no syncopal events, no diaphoresis. In ED, pt found to have HR in 150's and worrisome findings of atrial flutter with RVR vs SVT, [facility] asked to admit for further evaluation.  Hospital Course:  Principal Problem:   Atrial flutter with rapid ventricular response - clinically stable this AM, in sinus rhythm - appreciate cardio input - recommendation is to increase the dose of Metoprolol to 37.5 mg BID per cardio team - CE x 3 negative, no events on tele over 24 hours - 2 D ECHO pending on discharge  Active Problems:   Chest pain, atypical - please see above - resolved this AM   Fibromyalgia syndrome - Chronic steroid use    Procedures/Studies: Dg Chest Portable 1 View  11/29/2012  *RADIOLOGY REPORT*  Clinical Data: Heart  palpitations.  PORTABLE CHEST - 1 VIEW  Comparison: CT chest 01/28/2012.  Findings: Lung volumes are low with mild basilar atelectasis. Heart size is normal.  No pneumothorax or pleural effusion.  Convex right scoliosis noted.  IMPRESSION: No acute disease.   Original Report Authenticated By: Orlean Patten, M.D.     Consultations:  Cardiology  Antibiotics:  None  Discharge Exam: Filed Vitals:   11/30/12 0421  BP: 133/67  Pulse: 57  Temp: 98 F (36.7 C)  Resp: 16   Filed Vitals:   11/29/12 1923 11/29/12 2030 11/29/12 2127 11/30/12 0421  BP: 169/77 142/83 165/86 133/67  Pulse: 76 70 71 57  Temp: 98.5 F (36.9 C)  98.1 F (36.7 C) 98 F (36.7 C)  TempSrc: Oral  Oral Oral  Resp: 14 18 16 16   Height:   5' 3"  (1.6 m)   Weight:   125 lb 1.6 oz (56.745 kg)   SpO2: 97% 95% 97% 97%    General: Pt is alert, follows commands appropriately, not in acute distress Cardiovascular: Regular rate and rhythm, S1/S2 +, no murmurs, no rubs, no gallops Respiratory: Clear to auscultation bilaterally, no wheezing, no crackles, no rhonchi Abdominal: Soft, non tender, non distended, bowel sounds +, no guarding Extremities: no edema, no cyanosis, pulses palpable bilaterally DP and PT Neuro: Grossly nonfocal  Discharge Instructions  Discharge Orders   Future Orders Complete By Expires     Diet - low sodium heart healthy  As directed     Increase activity slowly  As directed  Medication List    TAKE these medications       metoprolol tartrate 25 MG tablet  Commonly known as:  LOPRESSOR  Take 1.5 tablets (37.5 mg total) by mouth 2 (two) times daily.     multivitamin with minerals Tabs  Take 1 tablet by mouth daily.     predniSONE 20 MG tablet  Commonly known as:  DELTASONE  Take 20 mg by mouth 2 (two) times daily.     zolpidem 5 MG tablet  Commonly known as:  AMBIEN  Take 5 mg by mouth at bedtime as needed for sleep.           Follow-up Information   Follow up  with [facility], MD In 2 weeks.   Contact information:   [place] [address] [phone]       Follow up with Shelva Majestic A, MD In 4 weeks.   Contact information:   [address] [place] [address] [phone]        The results of significant diagnostics from this hospitalization (including imaging, microbiology, ancillary and laboratory) are listed below for reference.     Microbiology: No results found for this or any previous visit (from the past 240 hour(s)).   Labs: Basic Metabolic Panel:  Recent Labs Lab 11/29/12 1736 11/29/12 2241  NA 139  --   K 4.1  --   CL 101  --   CO2 26  --   GLUCOSE 106*  --   BUN 14  --   CREATININE 0.78 0.79  CALCIUM 10.2  --    Liver Function Tests: No results found for this basename: AST, ALT, ALKPHOS, BILITOT, PROT, ALBUMIN,  in the last 168 hours No results found for this basename: LIPASE, AMYLASE,  in the last 168 hours No results found for this basename: AMMONIA,  in the last 168 hours CBC:  Recent Labs Lab 11/29/12 1736 11/29/12 2241  WBC 11.2* 11.4*  NEUTROABS 8.4*  --   HGB 13.0 13.5  HCT 37.1 38.2  MCV 89.4 89.3  PLT 258 264   Cardiac Enzymes:  Recent Labs Lab 11/29/12 1736 11/29/12 2241 11/30/12 0435 11/30/12 0924  TROPONINI <0.30 <0.30 <0.30 <0.30   BNP: BNP (last 3 results)  Recent Labs  11/29/12 1736  PROBNP 178.6   CBG: No results found for this basename: GLUCAP,  in the last 168 hours   SIGNED: Time coordinating discharge: Over 30 minutes  Faye Ramsay, MD  [facility] 11/30/2012, 4:33 PM Pager [phone]  If 7PM-7AM, please contact night-coverage [email or web address] Password [email or web address]

## 2012-11-30 NOTE — Progress Notes (Signed)
  Echocardiogram 2D Echocardiogram has been performed.  Janet Nguyen Janet Nguyen 11/30/2012, 12:38 PM

## 2012-11-30 NOTE — Progress Notes (Signed)
Pt had a Sinus pause of 2.04  Sec and HR sustained in 50's while asleep. On-call MD called and made aware,  no new order received. No indications of distress observed.  Will continue to monitor.

## 2012-11-30 NOTE — Care Management Note (Unsigned)
    Page 1 of 1   11/30/2012     3:02:44 PM   CARE MANAGEMENT NOTE 11/30/2012  Patient:  Janet Nguyen, Janet Nguyen   Account Number:  [id number]  Date Initiated:  11/30/2012  Documentation initiated by:  Kadasia Kassing  Subjective/Objective Assessment:   PT ADM ON 11/29/12 WITH CHEST PAIN.  PTA, PT INDEPENDENT, LIVES WITH SPOUSE.     Action/Plan:   WILL FOLLOW FOR HOME NEEDS AS PT PROGRESSES.   Anticipated DC Date:  12/01/2012   Anticipated DC Plan:  [place]  CM consult      Choice offered to / List presented to:             Status of service:  In process, will continue to follow Medicare Important Message given?   (If response is "NO", the following Medicare IM given date fields will be blank) Date Medicare IM given:   Date Additional Medicare IM given:    Discharge Disposition:    Per UR Regulation:  Reviewed for med. necessity/level of care/duration of stay  If discussed at [place] of Stay Meetings, dates discussed:    Comments:

## 2012-11-30 NOTE — Progress Notes (Signed)
Outpatient Lexiscan NST  order was placed.  [place] office will call the patient with the date and time.  Ellen Henri, PA-C [facility]

## 2012-11-30 NOTE — Consult Note (Signed)
Reason for Consult: Atrial Fibrillation w/ RVR and Chest Pain Referring Physician: [facility]   HPI: Janet Nguyen is an 79 y/o female, who was admitted overnight by [facility] for observation for A-fib w/ RVR and chest pain. She is a patient of Dr. Evette Georges, who has a history of SVT with documented PACs and PVCs, which have been well controlled with beta blocker therapy. She also has sleep apnea. She has no know CAD. She was last seen by Dr. [name] in the office on 10/22/12 for routine follow-up. During that visit, she was in sinus rhythm (65 bpm) without breakthrough PSVT or significant ectopy. She reports that yesterday, she had palpations that were accompanied with chest pressure. She had just finished having tea with friends at her home. She reports that she has experienced palpitations in the past, but yesterday's episode was "excessive". The episode lasted about 1.5 hrs.The chest pressure was constant, non-radiating and non-pleuritic. She denies associated SOB,syncope/presyncope, lightheadedness/dizziness. She was mildly diaphoretic. She called EMS and was told over the phone to take an ASA, which she did. When EMS arrived at her home, she was found to be in A-fib with RVR. Ventricular rate was ~150 bpm. By the time she presented to the ED, she had converted to normal sinus rhythm on her own. Her EKG showed NSR with no acute changes. Her CXR was normal. Troponin's have been negative x 3. She has had no recurrence of her tachyarrhythmia on telemetry and she denies further chest pain. She reports compliance with her home medications, which include 25 mg of Lopressor BID. She reports that she was restarted on Prednisone by her PCP for fibromyalgia. She drinks tea on a regular basis. She mostly drinks decaffeinated  tea, but did have regular tea prior to episode. She denies excessive alcohol use. She also denies hematochezia, hematuria and melana.    Past Medical History  Diagnosis Date   . Irregular heart beat   . Celiac disease   . GERD (gastroesophageal reflux disease)   . Osteoporosis   . Scoliosis   . Intervertebral disc stenosis of neural canal of cervical region     Past Surgical History  Procedure Laterality Date  . Appendectomy      ruptured at age 24 and had surgery    No family history on file.  Social History:  reports that she has never smoked. She does not have any smokeless tobacco history on file. She reports that she drinks about 0.5 ounces of alcohol per week. She reports that she does not use illicit drugs.  Allergies:  Allergies  Allergen Reactions  . Codeine Nausea Only  . Gluten Meal     Unknown    Medications:  Prior to Admission:  Prescriptions prior to admission  Medication Sig Dispense Refill  . metoprolol tartrate (LOPRESSOR) 25 MG tablet Take 25 mg by mouth 2 (two) times daily.      . Multiple Vitamin (MULTIVITAMIN WITH MINERALS) TABS Take 1 tablet by mouth daily.      . predniSONE (DELTASONE) 20 MG tablet Take 20 mg by mouth 2 (two) times daily.      Marland Kitchen zolpidem (AMBIEN) 5 MG tablet Take 5 mg by mouth at bedtime as needed for sleep.        Results for orders placed during the hospital encounter of 11/29/12 (from the past 48 hour(s))  CBC WITH DIFFERENTIAL     Status: Abnormal   Collection Time    11/29/12  5:36 PM  Result Value Range   WBC 11.2 (*) 4.0 - 10.5 K/uL   RBC 4.15  3.87 - 5.11 MIL/uL   Hemoglobin 13.0  12.0 - 15.0 g/dL   HCT 37.1  36.0 - 46.0 %   MCV 89.4  78.0 - 100.0 fL   MCH 31.3  26.0 - 34.0 pg   MCHC 35.0  30.0 - 36.0 g/dL   RDW 13.6  11.5 - 15.5 %   Platelets 258  150 - 400 K/uL   Neutrophils Relative 75  43 - 77 %   Neutro Abs 8.4 (*) 1.7 - 7.7 K/uL   Lymphocytes Relative 17  12 - 46 %   Lymphs Abs 1.9  0.7 - 4.0 K/uL   Monocytes Relative 7  3 - 12 %   Monocytes Absolute 0.8  0.1 - 1.0 K/uL   Eosinophils Relative 0  0 - 5 %   Eosinophils Absolute 0.0  0.0 - 0.7 K/uL   Basophils Relative 0  0  - 1 %   Basophils Absolute 0.0  0.0 - 0.1 K/uL  BASIC METABOLIC PANEL     Status: Abnormal   Collection Time    11/29/12  5:36 PM      Result Value Range   Sodium 139  135 - 145 mEq/L   Potassium 4.1  3.5 - 5.1 mEq/L   Chloride 101  96 - 112 mEq/L   CO2 26  19 - 32 mEq/L   Glucose, Bld 106 (*) 70 - 99 mg/dL   BUN 14  6 - 23 mg/dL   Creatinine, Ser 0.78  0.50 - 1.10 mg/dL   Calcium 10.2  8.4 - 10.5 mg/dL   GFR calc non Af Amer 78 (*) >90 mL/min   GFR calc Af Amer >90  >90 mL/min   Comment:            The eGFR has been calculated     using the CKD EPI equation.     This calculation has not been     validated in all clinical     situations.     eGFR's persistently     <90 mL/min signify     possible Chronic Kidney Disease.  TROPONIN I     Status: None   Collection Time    11/29/12  5:36 PM      Result Value Range   Troponin I <0.30  <0.30 ng/mL   Comment:            Due to the release kinetics of cTnI,     a negative result within the first hours     of the onset of symptoms does not rule out     myocardial infarction with certainty.     If myocardial infarction is still suspected,     repeat the test at appropriate intervals.  PRO B NATRIURETIC PEPTIDE     Status: None   Collection Time    11/29/12  5:36 PM      Result Value Range   Pro B Natriuretic peptide (BNP) 178.6  0 - 450 pg/mL  CBC     Status: Abnormal   Collection Time    11/29/12 10:41 PM      Result Value Range   WBC 11.4 (*) 4.0 - 10.5 K/uL   RBC 4.28  3.87 - 5.11 MIL/uL   Hemoglobin 13.5  12.0 - 15.0 g/dL   HCT 38.2  36.0 - 46.0 %   MCV 89.3  78.0 -  100.0 fL   MCH 31.5  26.0 - 34.0 pg   MCHC 35.3  30.0 - 36.0 g/dL   RDW 13.7  11.5 - 15.5 %   Platelets 264  150 - 400 K/uL  CREATININE, SERUM     Status: Abnormal   Collection Time    11/29/12 10:41 PM      Result Value Range   Creatinine, Ser 0.79  0.50 - 1.10 mg/dL   GFR calc non Af Amer 78 (*) >90 mL/min   GFR calc Af Amer >90  >90 mL/min    Comment:            The eGFR has been calculated     using the CKD EPI equation.     This calculation has not been     validated in all clinical     situations.     eGFR's persistently     <90 mL/min signify     possible Chronic Kidney Disease.  TSH     Status: None   Collection Time    11/29/12 10:41 PM      Result Value Range   TSH 1.612  0.350 - 4.500 uIU/mL  TROPONIN I     Status: None   Collection Time    11/29/12 10:41 PM      Result Value Range   Troponin I <0.30  <0.30 ng/mL   Comment:            Due to the release kinetics of cTnI,     a negative result within the first hours     of the onset of symptoms does not rule out     myocardial infarction with certainty.     If myocardial infarction is still suspected,     repeat the test at appropriate intervals.  TROPONIN I     Status: None   Collection Time    11/30/12  4:35 AM      Result Value Range   Troponin I <0.30  <0.30 ng/mL   Comment:            Due to the release kinetics of cTnI,     a negative result within the first hours     of the onset of symptoms does not rule out     myocardial infarction with certainty.     If myocardial infarction is still suspected,     repeat the test at appropriate intervals.  TROPONIN I     Status: None   Collection Time    11/30/12  9:24 AM      Result Value Range   Troponin I <0.30  <0.30 ng/mL   Comment:            Due to the release kinetics of cTnI,     a negative result within the first hours     of the onset of symptoms does not rule out     myocardial infarction with certainty.     If myocardial infarction is still suspected,     repeat the test at appropriate intervals.    Dg Chest Portable 1 View  11/29/2012  *RADIOLOGY REPORT*  Clinical Data: Heart palpitations.  PORTABLE CHEST - 1 VIEW  Comparison: CT chest 01/28/2012.  Findings: Lung volumes are low with mild basilar atelectasis. Heart size is normal.  No pneumothorax or pleural effusion.  Convex right  scoliosis noted.  IMPRESSION: No acute disease.   Original Report Authenticated By: Orlean Patten, M.D.  Review of Systems  Constitutional: Positive for diaphoresis. Negative for chills and malaise/fatigue.  HENT: Positive for sore throat and neck pain.   Respiratory: Negative for shortness of breath and wheezing.   Cardiovascular: Positive for chest pain (chest pressure) and palpitations. Negative for orthopnea, claudication, leg swelling and PND.  Gastrointestinal: Negative for nausea, vomiting, abdominal pain, diarrhea, constipation, blood in stool and melena.  Genitourinary: Negative for hematuria.  Musculoskeletal: Positive for myalgias and back pain (has scoliosis).  Neurological: Negative for dizziness, loss of consciousness and weakness.   Blood pressure 133/67, pulse 57, temperature 98 F (36.7 C), temperature source Oral, resp. rate 16, height 5' 3"  (1.6 m), weight 125 lb 1.6 oz (56.745 kg), SpO2 97.00%. Physical Exam  Constitutional: She is oriented to person, place, and time. She appears well-developed and well-nourished. No distress.  HENT:  Head: Normocephalic and atraumatic.  Eyes: Conjunctivae and EOM are normal. Pupils are equal, round, and reactive to light.  Neck: Normal range of motion. Neck supple. No JVD present. Carotid bruit is not present. No thyromegaly present.  Cardiovascular: Normal rate, regular rhythm, normal heart sounds and intact distal pulses.  Exam reveals no gallop and no friction rub.   No murmur heard. Pulses:      Radial pulses are 2+ on the right side, and 2+ on the left side.       Dorsalis pedis pulses are 2+ on the right side, and 2+ on the left side.  Respiratory: Effort normal and breath sounds normal. No respiratory distress. She has no wheezes. She has no rales.  GI: Soft. Bowel sounds are normal. She exhibits no distension and no mass. There is no tenderness.  Musculoskeletal: She exhibits edema (trace bilateral edema).   Lymphadenopathy:    She has no cervical adenopathy.  Neurological: She is alert and oriented to person, place, and time.  Skin: Skin is warm and dry. She is not diaphoretic.  Psychiatric: She has a normal mood and affect. Her behavior is normal.    Assessment/Plan: Principal Problem:   Atrial flutter with rapid ventricular response Active Problems:   Chest pain, atypical   Fibromyalgia syndrome   Chronic steroid use   Plan: Pt spontaneously converted to NSR yesterday. No further arrhythmias noted on telemetry. Her HR currently is in the 70's She has had some sinus brady w/ HR in the upper 50's. She denies any further episodes of palpitations. Her TSH was normal at 1.6. H/H WNL at 13.5/38.2. She is on 25 mg of Lopressor BID. BP is stable, with most recent being 133/67. She denies further chest pressure. Troponins negative x 3. EKG from yesterday showed TWIs in leads I and AVL, which are new from prior EKG on 10/22/12. CXR was normal. 2D echo from today is pending. ? If prednisone use is a contributing factor to yesterday's episode. Recent prednisone use may also explain elevated WBC (11.4). May consider ambulatory heart monitor to assess for PAF. May also consider outpatient NST. Her last NST was in 2010 and was low risk for ischemia. MD to read echo and follow with recommendation.   SIMMONS, [place] 11/30/2012, 11:51 AM   I have seen and evaluated the patient this PM along with Ellen Henri, PA. I agree with her findings, examination as well as impression recommendations.  The patient is a very pleasant elderly woman who has a h/o SVT - followed by Dr. [name].  She has noted increased sensation of "strong heart beats" that she can "hear" over the past ~  week.  Was just restarted on a Prednisone taper for her Polymyalgia Rheumatica.  Yesterday she came to the ER via EMS for severe palpitations -- ECG suggests Atrial Flutter @ ~155 bpm (although with h/o SVT & on BB - ? If this could have been  slow SVT -- no clear flutter waves) that broke prior to arriving in the ER.  She has chronic chest pressure, but this got worse with the tachycardia.    She is currently stable, Echo read & essentially normal with mild abnormalities (biatrial enlargement & mild-moderately elevated PAP).   No further arrthythmia or CP.  Post tachycardiac ECG has lateral TWI that were not noted in her office ECG from Jan 2014.  Troponin Neg x 3.  At this point, I think she is OK for d/c with OP f/u -- 28 d Event Monitor (we will set up to begin tomorrow); will increase BB to 37.5 mg bid; OP Lexiscan Myoview due to chest discomfort & new TWI on ECG.  F/u appt. With Dr. Leda Gauze after Myoview/Cardiolite to discuss results & ? Of further RX, +/- need for long-term Anticoagulation with Age & Female giving CHADS2Vasc2 score of 2.  OP f/u studies & appt. Being set up by [facility] PA -- will put in d/c instructions if dates & times available prior to d/c.  Would need to have Monitor placed tomorrow @ [facility] office (can be notified of other dates/times then if necessary).   Leonie Man, M.D., M.S. [facility] [address] [place], [place]  [address]  [phone] Pager # [phone] 11/30/2012 4:26 PM

## 2012-12-01 DIAGNOSIS — I4892 Unspecified atrial flutter: Secondary | ICD-10-CM | POA: Diagnosis not present

## 2012-12-01 DIAGNOSIS — R002 Palpitations: Secondary | ICD-10-CM | POA: Diagnosis not present

## 2012-12-02 DIAGNOSIS — M353 Polymyalgia rheumatica: Secondary | ICD-10-CM | POA: Diagnosis not present

## 2012-12-02 DIAGNOSIS — M810 Age-related osteoporosis without current pathological fracture: Secondary | ICD-10-CM | POA: Diagnosis not present

## 2012-12-02 DIAGNOSIS — IMO0002 Reserved for concepts with insufficient information to code with codable children: Secondary | ICD-10-CM | POA: Diagnosis not present

## 2012-12-02 DIAGNOSIS — IMO0001 Reserved for inherently not codable concepts without codable children: Secondary | ICD-10-CM | POA: Diagnosis not present

## 2012-12-08 ENCOUNTER — Ambulatory Visit (HOSPITAL_COMMUNITY)
Admission: RE | Admit: 2012-12-08 | Discharge: 2012-12-08 | Disposition: A | Discharge status: Home / Self Care | Payer: Medicare Other | Source: Ambulatory Visit | Attending: Cardiovascular Disease | Admitting: Cardiovascular Disease

## 2012-12-08 DIAGNOSIS — R079 Chest pain, unspecified: Secondary | ICD-10-CM | POA: Insufficient documentation

## 2012-12-08 MED ORDER — TECHNETIUM TC 99M SESTAMIBI GENERIC - CARDIOLITE
10.0000 | Freq: Once | INTRAVENOUS | Status: AC | PRN
  Administered 2012-12-08: 10 via INTRAVENOUS

## 2012-12-08 MED ORDER — REGADENOSON 0.4 MG/5ML IV SOLN
0.4000 mg | Freq: Once | INTRAVENOUS | Status: AC
  Administered 2012-12-08: 0.4 mg via INTRAVENOUS

## 2012-12-08 MED ORDER — AMINOPHYLLINE 25 MG/ML IV SOLN
75.0000 mg | Freq: Once | INTRAVENOUS | Status: AC
  Administered 2012-12-08: 75 mg via INTRAVENOUS

## 2012-12-08 MED ORDER — TECHNETIUM TC 99M SESTAMIBI GENERIC - CARDIOLITE
30.0000 | Freq: Once | INTRAVENOUS | Status: AC | PRN
  Administered 2012-12-08: 30 via INTRAVENOUS

## 2012-12-08 NOTE — Procedures (Addendum)
[place] [address] [place] [address] [phone]  Cardiology Nuclear Med Study  EDDA OREA is a 79 y.o. female     MRN : [id number]     DOB: 09-20-1932  Procedure Date: 12/08/2012  Nuclear Med Background Indication for Stress Test:  Abnormal EKG History:  PTCA-01/27/2006 Cardiac Risk Factors: Family History - CAD, Hypertension and SVT  Symptoms:  Chest Pain   Nuclear Pre-Procedure Caffeine/Decaff Intake:  10:00pm NPO After: 8:00am   IV Site: R Forearm  IV 0.9% NS with Angio Cath:  22g  Chest Size (in):  N/A IV Started by: Azucena Cecil, RN  Height: 5' 3"  (1.6 m)  Cup Size: B  BMI:  Body mass index is 22.15 kg/(m^2). Weight:  125 lb (56.7 kg)   Tech Comments:  N/A    Nuclear Med Study 1 or 2 day study: 1 day  Stress Test Type:  [place] Provider:  Hyman Hopes   Resting Radionuclide: Technetium 70mSestamibi  Resting Radionuclide Dose: 10.5 mCi   Stress Radionuclide:  Technetium 924mestamibi  Stress Radionuclide Dose: 30.2 mCi           Stress Protocol Rest HR: 74 Stress HR: 93  Rest BP: 160/91 Stress BP: 170/93  Exercise Time (min): n/a METS: n/a          Dose of Adenosine (mg):  n/a Dose of Lexiscan: 0.4 mg  Dose of Atropine (mg): n/a Dose of Dobutamine: n/a mcg/kg/min (at max HR)  Stress Test Technologist: GwMellody MemosCCT Nuclear Technologist: RoOtho PerlCNMT   Rest Procedure:  Myocardial perfusion imaging was performed at rest 45 minutes following the intravenous administration of Technetium 9960mstamibi. Stress Procedure:  The patient received IV Lexiscan 0.4 mg over 15-seconds.  Technetium 27m75mtamibi injected at 30-seconds.  Due to patient's shortness of breath and slight chest and throat heaviness, she was given IV Aminophylline 75 mg. Symptoms were resolved during recovery. There were no significant changes with Lexiscan.  Quantitative spect images  were obtained after a 45 minute delay.  Transient Ischemic Dilatation (Normal <1.22):  1.03 Lung/Heart Ratio (Normal <0.45):  0.27 QGS EDV:  57 ml QGS ESV:  15 ml LV Ejection Fraction: 74%  Signed by       Rest ECG: NSR - Normal EKG  Stress ECG: No significant change from baseline ECG  QPS Raw Data Images:  Normal; no motion artifact; normal heart/lung ratio. Stress Images:  Normal homogeneous uptake in all areas of the myocardium. Rest Images:  Normal homogeneous uptake in all areas of the myocardium. Subtraction (SDS):  No evidence of ischemia.  Impression Exercise Capacity:  Lexiscan with no exercise. BP Response:  Normal blood pressure response. Clinical Symptoms:  Mild chest pain/dyspnea. ECG Impression:  No significant ST segment change suggestive of ischemia. Comparison with Prior Nuclear Study: No significant change from previous study  Overall Impression:  Normal stress nuclear study.  LV Wall Motion:  NL LV Function; NL Wall Motion   BERRLorretta Harp  12/08/2012 1:37 PM

## 2012-12-14 DIAGNOSIS — I472 Ventricular tachycardia: Secondary | ICD-10-CM | POA: Diagnosis not present

## 2012-12-14 DIAGNOSIS — I4892 Unspecified atrial flutter: Secondary | ICD-10-CM | POA: Diagnosis not present

## 2012-12-14 DIAGNOSIS — I4729 Other ventricular tachycardia: Secondary | ICD-10-CM | POA: Diagnosis not present

## 2012-12-15 DIAGNOSIS — R109 Unspecified abdominal pain: Secondary | ICD-10-CM | POA: Diagnosis not present

## 2012-12-15 DIAGNOSIS — F411 Generalized anxiety disorder: Secondary | ICD-10-CM | POA: Diagnosis not present

## 2012-12-23 DIAGNOSIS — J3489 Other specified disorders of nose and nasal sinuses: Secondary | ICD-10-CM | POA: Diagnosis not present

## 2012-12-23 DIAGNOSIS — R079 Chest pain, unspecified: Secondary | ICD-10-CM | POA: Diagnosis not present

## 2013-01-06 DIAGNOSIS — I4729 Other ventricular tachycardia: Secondary | ICD-10-CM | POA: Diagnosis not present

## 2013-01-06 DIAGNOSIS — R5381 Other malaise: Secondary | ICD-10-CM | POA: Diagnosis not present

## 2013-01-06 DIAGNOSIS — I472 Ventricular tachycardia: Secondary | ICD-10-CM | POA: Diagnosis not present

## 2013-01-06 DIAGNOSIS — G4733 Obstructive sleep apnea (adult) (pediatric): Secondary | ICD-10-CM | POA: Diagnosis not present

## 2013-01-12 DIAGNOSIS — M353 Polymyalgia rheumatica: Secondary | ICD-10-CM | POA: Diagnosis not present

## 2013-01-17 DIAGNOSIS — M810 Age-related osteoporosis without current pathological fracture: Secondary | ICD-10-CM | POA: Diagnosis not present

## 2013-01-17 DIAGNOSIS — IMO0002 Reserved for concepts with insufficient information to code with codable children: Secondary | ICD-10-CM | POA: Diagnosis not present

## 2013-01-17 DIAGNOSIS — IMO0001 Reserved for inherently not codable concepts without codable children: Secondary | ICD-10-CM | POA: Diagnosis not present

## 2013-01-17 DIAGNOSIS — M353 Polymyalgia rheumatica: Secondary | ICD-10-CM | POA: Diagnosis not present

## 2013-02-07 DIAGNOSIS — Z79899 Other long term (current) drug therapy: Secondary | ICD-10-CM | POA: Diagnosis not present

## 2013-02-07 DIAGNOSIS — R5381 Other malaise: Secondary | ICD-10-CM | POA: Diagnosis not present

## 2013-02-07 DIAGNOSIS — M810 Age-related osteoporosis without current pathological fracture: Secondary | ICD-10-CM | POA: Diagnosis not present

## 2013-02-07 DIAGNOSIS — J3489 Other specified disorders of nose and nasal sinuses: Secondary | ICD-10-CM | POA: Diagnosis not present

## 2013-02-07 DIAGNOSIS — R5383 Other fatigue: Secondary | ICD-10-CM | POA: Diagnosis not present

## 2013-02-07 DIAGNOSIS — M4120 Other idiopathic scoliosis, site unspecified: Secondary | ICD-10-CM | POA: Diagnosis not present

## 2013-02-07 DIAGNOSIS — G4733 Obstructive sleep apnea (adult) (pediatric): Secondary | ICD-10-CM | POA: Diagnosis not present

## 2013-02-24 DIAGNOSIS — IMO0001 Reserved for inherently not codable concepts without codable children: Secondary | ICD-10-CM | POA: Diagnosis not present

## 2013-02-24 DIAGNOSIS — J342 Deviated nasal septum: Secondary | ICD-10-CM | POA: Diagnosis not present

## 2013-02-24 DIAGNOSIS — M353 Polymyalgia rheumatica: Secondary | ICD-10-CM | POA: Diagnosis not present

## 2013-02-24 DIAGNOSIS — G4733 Obstructive sleep apnea (adult) (pediatric): Secondary | ICD-10-CM | POA: Diagnosis not present

## 2013-02-24 DIAGNOSIS — K117 Disturbances of salivary secretion: Secondary | ICD-10-CM | POA: Diagnosis not present

## 2013-03-09 DIAGNOSIS — M47817 Spondylosis without myelopathy or radiculopathy, lumbosacral region: Secondary | ICD-10-CM | POA: Diagnosis not present

## 2013-03-09 DIAGNOSIS — M5137 Other intervertebral disc degeneration, lumbosacral region: Secondary | ICD-10-CM | POA: Diagnosis not present

## 2013-03-09 DIAGNOSIS — M545 Low back pain: Secondary | ICD-10-CM | POA: Diagnosis not present

## 2013-03-09 DIAGNOSIS — M76899 Other specified enthesopathies of unspecified lower limb, excluding foot: Secondary | ICD-10-CM | POA: Diagnosis not present

## 2013-03-09 DIAGNOSIS — M4120 Other idiopathic scoliosis, site unspecified: Secondary | ICD-10-CM | POA: Diagnosis not present

## 2013-03-14 DIAGNOSIS — M47817 Spondylosis without myelopathy or radiculopathy, lumbosacral region: Secondary | ICD-10-CM | POA: Diagnosis not present

## 2013-03-14 DIAGNOSIS — M4120 Other idiopathic scoliosis, site unspecified: Secondary | ICD-10-CM | POA: Diagnosis not present

## 2013-03-14 DIAGNOSIS — M545 Low back pain: Secondary | ICD-10-CM | POA: Diagnosis not present

## 2013-03-14 DIAGNOSIS — M5137 Other intervertebral disc degeneration, lumbosacral region: Secondary | ICD-10-CM | POA: Diagnosis not present

## 2013-03-17 DIAGNOSIS — M47817 Spondylosis without myelopathy or radiculopathy, lumbosacral region: Secondary | ICD-10-CM | POA: Diagnosis not present

## 2013-03-17 DIAGNOSIS — M4120 Other idiopathic scoliosis, site unspecified: Secondary | ICD-10-CM | POA: Diagnosis not present

## 2013-03-17 DIAGNOSIS — M545 Low back pain: Secondary | ICD-10-CM | POA: Diagnosis not present

## 2013-03-17 DIAGNOSIS — M5137 Other intervertebral disc degeneration, lumbosacral region: Secondary | ICD-10-CM | POA: Diagnosis not present

## 2013-03-18 DIAGNOSIS — K219 Gastro-esophageal reflux disease without esophagitis: Secondary | ICD-10-CM | POA: Diagnosis not present

## 2013-03-21 DIAGNOSIS — IMO0001 Reserved for inherently not codable concepts without codable children: Secondary | ICD-10-CM | POA: Diagnosis not present

## 2013-03-21 DIAGNOSIS — M810 Age-related osteoporosis without current pathological fracture: Secondary | ICD-10-CM | POA: Diagnosis not present

## 2013-03-21 DIAGNOSIS — M353 Polymyalgia rheumatica: Secondary | ICD-10-CM | POA: Diagnosis not present

## 2013-03-21 DIAGNOSIS — IMO0002 Reserved for concepts with insufficient information to code with codable children: Secondary | ICD-10-CM | POA: Diagnosis not present

## 2013-03-22 DIAGNOSIS — M4120 Other idiopathic scoliosis, site unspecified: Secondary | ICD-10-CM | POA: Diagnosis not present

## 2013-03-22 DIAGNOSIS — M5137 Other intervertebral disc degeneration, lumbosacral region: Secondary | ICD-10-CM | POA: Diagnosis not present

## 2013-03-22 DIAGNOSIS — M545 Low back pain: Secondary | ICD-10-CM | POA: Diagnosis not present

## 2013-03-22 DIAGNOSIS — M47817 Spondylosis without myelopathy or radiculopathy, lumbosacral region: Secondary | ICD-10-CM | POA: Diagnosis not present

## 2013-03-23 ENCOUNTER — Telehealth: Payer: Self-pay | Admitting: Cardiovascular Disease

## 2013-03-23 NOTE — Telephone Encounter (Signed)
Quenton Fetter, PA-C was notified and spoke w/ pt.

## 2013-03-23 NOTE — Telephone Encounter (Signed)
Returned call.  Pt stated Dr. [name] gave her metoprolol tartrate to take in addition to the long-acting metoprolol if she had the fluttering again.  Stated she did take the tartrate and about 30 mins afterwards the fluttering stopped.  Pt stated it wasn't discussed how long she should wait to take another one if it returned or what to do if it returned.  Last OV note reviewed and pt was advised to take metoprolol tart for tachypalpitations and if recurrent, he may change medication regimen.  Pt informed MD will be notified for further instructions.  Pt verbalized understanding and agreed w/ plan.  Message forwarded to Dr. [name].  Paper chart# [id number] on triage cart if RN needed to review further.

## 2013-03-23 NOTE — Telephone Encounter (Signed)
Started having some atrial fluttering-took a metoprolol about 10 minutes ago-if this doesn't stop it-how long should she wait  before she call you or take another pill? Getting a little anxious!

## 2013-03-23 NOTE — Telephone Encounter (Signed)
I spoke with the patient initially appeared not take more than one dose of metoprolol tartrate daily in addition to her twice daily Toprol XL.  She will call the office, if after one extra dose of Lopressor, she continues to have fluttering or if it reoccurs the same day.  Janet Nguyen 5:45 PM

## 2013-03-28 ENCOUNTER — Ambulatory Visit: Payer: Medicare Other | Admitting: Cardiology

## 2013-03-28 DIAGNOSIS — M4120 Other idiopathic scoliosis, site unspecified: Secondary | ICD-10-CM | POA: Diagnosis not present

## 2013-03-28 DIAGNOSIS — M5137 Other intervertebral disc degeneration, lumbosacral region: Secondary | ICD-10-CM | POA: Diagnosis not present

## 2013-03-28 DIAGNOSIS — M47817 Spondylosis without myelopathy or radiculopathy, lumbosacral region: Secondary | ICD-10-CM | POA: Diagnosis not present

## 2013-03-28 DIAGNOSIS — M545 Low back pain: Secondary | ICD-10-CM | POA: Diagnosis not present

## 2013-03-30 DIAGNOSIS — M47817 Spondylosis without myelopathy or radiculopathy, lumbosacral region: Secondary | ICD-10-CM | POA: Diagnosis not present

## 2013-03-30 DIAGNOSIS — M545 Low back pain: Secondary | ICD-10-CM | POA: Diagnosis not present

## 2013-03-30 DIAGNOSIS — M5137 Other intervertebral disc degeneration, lumbosacral region: Secondary | ICD-10-CM | POA: Diagnosis not present

## 2013-03-30 DIAGNOSIS — M4120 Other idiopathic scoliosis, site unspecified: Secondary | ICD-10-CM | POA: Diagnosis not present

## 2013-03-31 ENCOUNTER — Encounter: Payer: Self-pay | Admitting: *Deleted

## 2013-03-31 ENCOUNTER — Encounter: Payer: Self-pay | Admitting: Cardiovascular Disease

## 2013-04-02 DIAGNOSIS — R079 Chest pain, unspecified: Secondary | ICD-10-CM | POA: Diagnosis not present

## 2013-04-02 DIAGNOSIS — K219 Gastro-esophageal reflux disease without esophagitis: Secondary | ICD-10-CM | POA: Diagnosis not present

## 2013-04-03 DIAGNOSIS — J019 Acute sinusitis, unspecified: Secondary | ICD-10-CM | POA: Diagnosis not present

## 2013-04-05 ENCOUNTER — Ambulatory Visit: Payer: Medicare Other | Admitting: Cardiovascular Disease

## 2013-04-11 ENCOUNTER — Telehealth: Payer: Self-pay | Admitting: Cardiovascular Disease

## 2013-04-11 ENCOUNTER — Ambulatory Visit (INDEPENDENT_AMBULATORY_CARE_PROVIDER_SITE_OTHER): Payer: Medicare Other | Admitting: Cardiovascular Disease

## 2013-04-11 ENCOUNTER — Encounter: Payer: Self-pay | Admitting: Cardiovascular Disease

## 2013-04-11 VITALS — BP 122/70 | HR 59 | Ht 62.0 in | Wt 125.3 lb

## 2013-04-11 DIAGNOSIS — I4892 Unspecified atrial flutter: Secondary | ICD-10-CM

## 2013-04-11 DIAGNOSIS — M4120 Other idiopathic scoliosis, site unspecified: Secondary | ICD-10-CM

## 2013-04-11 DIAGNOSIS — G4730 Sleep apnea, unspecified: Secondary | ICD-10-CM | POA: Diagnosis not present

## 2013-04-11 DIAGNOSIS — M419 Scoliosis, unspecified: Secondary | ICD-10-CM | POA: Insufficient documentation

## 2013-04-11 MED ORDER — METOPROLOL SUCCINATE ER 25 MG PO TB24: ORAL_TABLET | ORAL | Status: DC

## 2013-04-11 NOTE — Patient Instructions (Signed)
Your physician has recommended you make the following change in your medication: increase your metoprolol  To 75 mg in the morning and 50 mg in the afternoon.  Your physician recommends that you schedule a follow-up appointment in: 3 months.

## 2013-04-11 NOTE — Telephone Encounter (Signed)
Need authorization for Metoprolol-it was $157-said doctor office would have to call-She was here today and he increased it today!She wants you(Wanda) to call her!

## 2013-04-11 NOTE — Progress Notes (Signed)
Patient ID: Janet Nguyen, female   DOB: 1932/07/19, 79 y.o.   MRN: [id number]      HPI: Janet Nguyen, is a 79 y.o. female  who presents for cardiology evaluation with a chief complaint of experiencing 2 episodes of recent chest fluttering.  Janet Nguyen has a history of documented SVT and also has PACs and PVCs which have been treated with beta blocker therapy. She has a history of obstructive sleep apnea but despite multiple attempts at CPAP utilization she has not been able to tolerate this. She'll truly was referred to Dr. [name] and has a customized dental appliance with mandibular advancement with improvement in some of her symptomatology. Work, she does admit to some concern that her teeth may be moving in more recently she has just been using a customized appliance that she had used in the past to prevent teeth grinding. When I last saw Janet Nguyen, further titrated her beta blocker therapy after cardiac event monitor did show several bursts of recurrent SVT up to 177 beats per minute in March. She has been on Toprol XL 50 mg twice a day and has a when necessary metoprolol tartrate that she takes in addition with recurrent episodes. Since I last saw her she had 2 episodes of chest fluttering which each lasted over 30 minutes which he did take metoprolol tartrate with relief. One occurred on June 25 at 9:30 AM and another occurred on April 07 9029 p.m. Additional problems also include some sinus congestion. She also has scoliosis and has some chest pain related to this. She has a followup appointment to see Dr. [name].   Past Medical History  Diagnosis Date  . Irregular heart beat 11/30/12    ECHO-EF 60-65%  . Celiac disease     treated by Dr. [name]  . GERD (gastroesophageal reflux disease)   . Osteoporosis   . Scoliosis   . Intervertebral disc stenosis of neural canal of cervical region   . Sleep apnea 10/02/11 [facility]    Sleep study AHI -total sleep 10.3/hr   64.0/ hr during REM sleep.RDI 22.8/hr during total sleep 64.0/hr during REM sleep The lowest O2 sat during Non-REM and REM sleep was 86% and 88% respectively. 04/08/12 CPAP/BIPAP titration study [facility]  . Scoliosis   . Arrhythmia     History of SVT with documented PVC'S and  PAC'S  12/08/12 Nuc stress test normal LV EF 74%  Event Monitor  12/01/12-01/03/13    Past Surgical History  Procedure Laterality Date  . Appendectomy      ruptured at age 66 and had surgery  . Cardiac catheterization  01/27/06    Allergies  Allergen Reactions  . Proton Pump Inhibitors     allopecia  . Codeine Nausea Only  . Gluten Meal     Unknown    Current Outpatient Prescriptions  Medication Sig Dispense Refill  . acetaminophen (TYLENOL) 500 MG tablet Take 500 mg by mouth as needed for pain.      Marland Kitchen ALPRAZolam (XANAX) 0.25 MG tablet       . amoxicillin (AMOXIL) 500 MG tablet Take 1,000 mg by mouth 2 (two) times daily.      Marland Kitchen aspirin 81 MG tablet Take 81 mg by mouth daily.      . calcium-vitamin D (OSCAL) 250-125 MG-UNIT per tablet Take 1 tablet by mouth daily.      . meloxicam (MOBIC) 7.5 MG tablet       .  metoprolol succinate (TOPROL-XL) 25 MG 24 hr tablet TAKE 3 TABLETS IN THE AM AND 2 TABLETS IN THE PM  150 tablet  6  . Multiple Vitamin (MULTIVITAMIN WITH MINERALS) TABS Take 1 tablet by mouth daily.      . traMADol (ULTRAM) 50 MG tablet Take 50 mg by mouth as needed for pain.      Marland Kitchen zolpidem (AMBIEN CR) 6.25 MG CR tablet Take 6.25 mg by mouth at bedtime as needed (sleep initiation and maintenance).      Marland Kitchen diltiazem (CARDIZEM) 120 MG tablet Take 120 mg by mouth at bedtime.       No current facility-administered medications for this visit.    Socially she is divorced has 4 children 9 grandchildren. She does exercise. No tobacco use. She does occasional wine  ROS is negative for fevers, chills or night sweats. She denies syncope. She had experienced these 2 episodes of  palpitations lasting greater than 30 minutes in duration. She did not take her pulse rate. She denies anginal type symptoms. She denies bleeding. She does have some nasal congestion. She denies paresthesias. She denies edema.   Other system review is negative.  PE BP 122/70  Pulse 59  Ht 5' 2"  (1.575 m)  Wt 125 lb 4.8 oz (56.836 kg)  BMI 22.91 kg/m2  General: Alert, oriented, no distress.  Skin: normal turgor, no rashes HEENT: Normocephalic, atraumatic. Pupils round and reactive; sclera anicteric;no lid lag.  Nose without nasal septal hypertrophy Mouth/Parynx benign; Mallinpatti scale 3 Neck: No JVD, no carotid briuts Lungs: clear to ausculatation and percussion; no wheezing or rales Heart: RRR, s1 s2 normal 6 systolic murmur; no ectopy on prolonged auscultation Abdomen: soft, nontender; no hepatosplenomehaly, BS+; abdominal aorta nontender and not dilated by palpation. Pulses 2+ Extremities: no clubbing cyanosis or edema, Homan's sign negative  Neurologic: grossly nonfocal  ECG: Sinus bradycardia 59 beats per minute. QTc interval 394 msec  LABS:  BMET    Component Value Date/Time   NA 139 11/29/2012 1736   K 4.1 11/29/2012 1736   CL 101 11/29/2012 1736   CO2 26 11/29/2012 1736   GLUCOSE 106* 11/29/2012 1736   BUN 14 11/29/2012 1736   CREATININE 0.79 11/29/2012 2241   CALCIUM 10.2 11/29/2012 1736   GFRNONAA 78* 11/29/2012 2241   GFRAA >90 11/29/2012 2241     Hepatic Function Panel     Component Value Date/Time   PROT 7.2 05/11/2010 0436   ALBUMIN 3.3* 05/11/2010 0436   AST 18 05/11/2010 0436   ALT 18 05/11/2010 0436   ALKPHOS 66 05/11/2010 0436   BILITOT 0.6 05/11/2010 0436     CBC    Component Value Date/Time   WBC 11.4* 11/29/2012 2241   RBC 4.28 11/29/2012 2241   HGB 13.5 11/29/2012 2241   HCT 38.2 11/29/2012 2241   PLT 264 11/29/2012 2241   MCV 89.3 11/29/2012 2241   MCH 31.5 11/29/2012 2241   MCHC 35.3 11/29/2012 2241   RDW 13.7 11/29/2012 2241   LYMPHSABS 1.9 11/29/2012 1736   MONOABS 0.8  11/29/2012 1736   EOSABS 0.0 11/29/2012 1736   BASOSABS 0.0 11/29/2012 1736     BNP    Component Value Date/Time   PROBNP 178.6 11/29/2012 1736    Lipid Panel  No results found for this basename: chol, trig, hdl, cholhdl, vldl, ldlcalc     RADIOLOGY: No results found.    ASSESSMENT AND PLAN: Ms. Chalk is 79 years old. Has a history of documented  SVT and PACs/PVCs which have been controlled in the past with beta blocker therapy. I am now recommending slight additional further titration of her Toprol-XL to 75 mg in the morning and 50 mg at night but if she continues to experience recurrent palpitations she is to increase this to 75 mg twice a day. She is to see Dr. Ron Parker back again for her customize dental appliance. If she has not been utilizing her customize dental appliance, suspect her sleep apnea is not as well treated. Her blood pressure today is stable at 10/22/1972 by my exam. Her intervals are normal on her EKG. I will see her in 3 months for followup evaluation or sooner if problems arise.     Troy Sine, MD, [facility]  04/11/2013 5:58 PM

## 2013-04-14 ENCOUNTER — Telehealth: Payer: Self-pay | Admitting: *Deleted

## 2013-04-14 ENCOUNTER — Encounter: Payer: Self-pay | Admitting: *Deleted

## 2013-04-14 NOTE — Telephone Encounter (Signed)
Faxed PA forms to [facility] for Metoprolol and Zopidem.

## 2013-04-14 NOTE — Telephone Encounter (Signed)
Called  [employer] to get a PA form to be sent to me for zolpidem.

## 2013-04-15 ENCOUNTER — Telehealth: Payer: Self-pay | Admitting: *Deleted

## 2013-04-15 NOTE — Telephone Encounter (Signed)
Informed pharmacist @ [facility] metoprolol  25 mg approved for a qty 150.

## 2013-04-18 ENCOUNTER — Telehealth: Payer: Self-pay | Admitting: Cardiovascular Disease

## 2013-04-18 NOTE — Telephone Encounter (Signed)
Returned call.  Left message to call back tomorrow before 4pm.

## 2013-04-18 NOTE — Telephone Encounter (Signed)
Pt called back.  Stated she received a letter about the wrong medication being approved.  Stated it's supposed to be for metoprolol and it was for zolpidem.   Chart reviewed.  Pt informed Mariann Laster, CMA for Dr. [name] called [facility] on 7.18.14 at 4:19pm and informed them that metoprolol 19m approved for #150.  Pt stated our office needs to call about the PA.  Pt informed this has been completed.  Pt stated she talked to the pharmacy prior to Wanda's call to pharmacy.  Stated she will call them to find out if it's ready and she will call back if it's not.  Pt informed that is fine and her call may not be returned until tomorrow when there will be time to work on PUtah  Pt verbalized understanding and agreed w/ plan.

## 2013-04-18 NOTE — Telephone Encounter (Signed)
Please call-mix up in her prescription-Please call today

## 2013-04-20 DIAGNOSIS — M4120 Other idiopathic scoliosis, site unspecified: Secondary | ICD-10-CM | POA: Diagnosis not present

## 2013-04-20 DIAGNOSIS — M545 Low back pain: Secondary | ICD-10-CM | POA: Diagnosis not present

## 2013-04-20 DIAGNOSIS — M47817 Spondylosis without myelopathy or radiculopathy, lumbosacral region: Secondary | ICD-10-CM | POA: Diagnosis not present

## 2013-04-20 DIAGNOSIS — M5137 Other intervertebral disc degeneration, lumbosacral region: Secondary | ICD-10-CM | POA: Diagnosis not present

## 2013-04-21 DIAGNOSIS — M47817 Spondylosis without myelopathy or radiculopathy, lumbosacral region: Secondary | ICD-10-CM | POA: Diagnosis not present

## 2013-04-21 DIAGNOSIS — M4120 Other idiopathic scoliosis, site unspecified: Secondary | ICD-10-CM | POA: Diagnosis not present

## 2013-04-21 DIAGNOSIS — M76899 Other specified enthesopathies of unspecified lower limb, excluding foot: Secondary | ICD-10-CM | POA: Diagnosis not present

## 2013-04-21 DIAGNOSIS — M5137 Other intervertebral disc degeneration, lumbosacral region: Secondary | ICD-10-CM | POA: Diagnosis not present

## 2013-04-22 ENCOUNTER — Other Ambulatory Visit: Payer: Self-pay | Admitting: Orthopaedic Surgery

## 2013-04-22 DIAGNOSIS — M5134 Other intervertebral disc degeneration, thoracic region: Secondary | ICD-10-CM

## 2013-05-02 ENCOUNTER — Ambulatory Visit
Admission: RE | Admit: 2013-05-02 | Discharge: 2013-05-02 | Disposition: A | Discharge status: Home / Self Care | Payer: Medicare Other | Source: Ambulatory Visit | Attending: Orthopaedic Surgery | Admitting: Orthopaedic Surgery

## 2013-05-02 DIAGNOSIS — M5134 Other intervertebral disc degeneration, thoracic region: Secondary | ICD-10-CM

## 2013-05-02 DIAGNOSIS — M4804 Spinal stenosis, thoracic region: Secondary | ICD-10-CM | POA: Diagnosis not present

## 2013-05-02 DIAGNOSIS — M48061 Spinal stenosis, lumbar region without neurogenic claudication: Secondary | ICD-10-CM | POA: Diagnosis not present

## 2013-05-02 DIAGNOSIS — IMO0002 Reserved for concepts with insufficient information to code with codable children: Secondary | ICD-10-CM | POA: Diagnosis not present

## 2013-05-02 DIAGNOSIS — M5137 Other intervertebral disc degeneration, lumbosacral region: Secondary | ICD-10-CM | POA: Diagnosis not present

## 2013-05-02 DIAGNOSIS — M47814 Spondylosis without myelopathy or radiculopathy, thoracic region: Secondary | ICD-10-CM | POA: Diagnosis not present

## 2013-05-09 DIAGNOSIS — M899 Disorder of bone, unspecified: Secondary | ICD-10-CM | POA: Diagnosis not present

## 2013-05-09 DIAGNOSIS — Z13820 Encounter for screening for osteoporosis: Secondary | ICD-10-CM | POA: Diagnosis not present

## 2013-05-09 DIAGNOSIS — M949 Disorder of cartilage, unspecified: Secondary | ICD-10-CM | POA: Diagnosis not present

## 2013-05-09 DIAGNOSIS — N760 Acute vaginitis: Secondary | ICD-10-CM | POA: Diagnosis not present

## 2013-05-12 DIAGNOSIS — M4120 Other idiopathic scoliosis, site unspecified: Secondary | ICD-10-CM | POA: Diagnosis not present

## 2013-06-30 DIAGNOSIS — Z23 Encounter for immunization: Secondary | ICD-10-CM | POA: Diagnosis not present

## 2013-06-30 DIAGNOSIS — J3489 Other specified disorders of nose and nasal sinuses: Secondary | ICD-10-CM | POA: Diagnosis not present

## 2013-06-30 DIAGNOSIS — M4120 Other idiopathic scoliosis, site unspecified: Secondary | ICD-10-CM | POA: Diagnosis not present

## 2013-06-30 DIAGNOSIS — M79609 Pain in unspecified limb: Secondary | ICD-10-CM | POA: Diagnosis not present

## 2013-06-30 DIAGNOSIS — G479 Sleep disorder, unspecified: Secondary | ICD-10-CM | POA: Diagnosis not present

## 2013-06-30 DIAGNOSIS — K219 Gastro-esophageal reflux disease without esophagitis: Secondary | ICD-10-CM | POA: Diagnosis not present

## 2013-07-06 ENCOUNTER — Ambulatory Visit: Payer: Medicare Other | Admitting: Cardiovascular Disease

## 2013-07-13 ENCOUNTER — Ambulatory Visit (INDEPENDENT_AMBULATORY_CARE_PROVIDER_SITE_OTHER): Payer: Medicare Other | Admitting: Cardiovascular Disease

## 2013-07-13 ENCOUNTER — Encounter: Payer: Self-pay | Admitting: Cardiovascular Disease

## 2013-07-13 VITALS — BP 134/80 | HR 61 | Ht 63.0 in | Wt 128.2 lb

## 2013-07-13 DIAGNOSIS — R002 Palpitations: Secondary | ICD-10-CM | POA: Insufficient documentation

## 2013-07-13 DIAGNOSIS — G4730 Sleep apnea, unspecified: Secondary | ICD-10-CM | POA: Diagnosis not present

## 2013-07-13 NOTE — Progress Notes (Signed)
Patient ID: Janet Nguyen, female   DOB: 12/07/1931, 79 y.o.   MRN: [id number]      HPI: Janet Nguyen, is a 79 y.o. female  who presents for followup cardiology evaluation. I last saw her 3 months ago for recurrent palpitations.  Janet Nguyen has a history of documented SVT and also has PACs and PVCs which have been treated with beta blocker therapy. She has a history of obstructive sleep apnea but despite multiple attempts at CPAP utilization she has not been able to tolerate this. She was referred to Dr. [name] and has a customized dental appliance with mandibular advancement with improvement in some of her symptomatology. Due concern that her teeth may be moving in more recently she has has not been using customized appliance daily but alternate using this with her old mouthguard.  When I  saw Janet Nguyen in April 2014 I further titrated her beta blocker therapy after cardiac event monitor did show several bursts of recurrent SVT up to 177 beats per minute in March. She has been on Toprol XL 50 mg twice a day and has a when necessary metoprolol tartrate that she takes in addition with recurrent episodes. In July after she had had 2 episodes of chest fluttering which each lasted over 30 minutes which he did take metoprolol tartrate with relief I recommended further titration of her Toprol to 75 mg in the morning and 50 mg at night and if necessary she could further titrate this to 75 twice a day.  She states the 70 5 in the morning 50 mg at night dose has completely resolved her palpitations. She continues to have some difficulty with sleep. She does have significant scoliosis and at times this leads to breathing restriction. She apparently was taken off her baby aspirin completely. She denies any bleeding episodes. She presents now for evaluation. At times he does have issues with anxiety and has taken when necessary alprazolam for sleep as well as when necessary zolpidem intermittently.     Past Medical History  Diagnosis Date  . Irregular heart beat 11/30/12    ECHO-EF 60-65%  . Celiac disease     treated by Dr. [name]  . GERD (gastroesophageal reflux disease)   . Osteoporosis   . Scoliosis   . Intervertebral disc stenosis of neural canal of cervical region   . Sleep apnea 10/02/11 [facility]    Sleep study AHI -total sleep 10.3/hr  64.0/ hr during REM sleep.RDI 22.8/hr during total sleep 64.0/hr during REM sleep The lowest O2 sat during Non-REM and REM sleep was 86% and 88% respectively. 04/08/12 CPAP/BIPAP titration study [facility]  . Scoliosis   . Arrhythmia     History of SVT with documented PVC'S and  PAC'S  12/08/12 Nuc stress test normal LV EF 74%  Event Monitor  12/01/12-01/03/13    Past Surgical History  Procedure Laterality Date  . Appendectomy      ruptured at age 49 and had surgery  . Cardiac catheterization  01/27/06    Allergies  Allergen Reactions  . Proton Pump Inhibitors     allopecia  . Codeine Nausea Only  . Gluten Meal     Unknown    Current Outpatient Prescriptions  Medication Sig Dispense Refill  . acetaminophen (TYLENOL) 500 MG tablet Take 500 mg by mouth as needed for pain.      Marland Kitchen ALPRAZolam (XANAX) 0.25 MG tablet       .  calcium-vitamin D (OSCAL) 250-125 MG-UNIT per tablet Take 1 tablet by mouth daily.      Marland Kitchen esomeprazole (NEXIUM) 20 MG capsule Take 20 mg by mouth daily before breakfast.      . metoprolol succinate (TOPROL-XL) 25 MG 24 hr tablet TAKE 3 TABLETS IN THE AM AND 2 TABLETS IN THE PM  150 tablet  6  . Multiple Vitamin (MULTIVITAMIN WITH MINERALS) TABS Take 1 tablet by mouth daily.       No current facility-administered medications for this visit.    Socially she is divorced has 4 children 9 grandchildren. She does exercise. No tobacco use. She does occasional wine  ROS is negative for fevers, chills or night sweats. She denies presyncope or syncope. She denies cough. She denies  congestion. She denies wheezing. She does note some mild shortness of breath intermittently. He thinks this may be posturally related as a result of her progressive scoliosis. She denies palpitations on her increased beta blocker regimen.  She denies anginal type symptoms. She denies bleeding.  She denies paresthesias. She denies edema. There is no claudication. There is no diabetes. He denies bleeding.  Other comprehensive 12 point system review is negative.  PE BP 134/80  Pulse 61  Ht 5' 3"  (1.6 m)  Wt 128 lb 3.2 oz (58.151 kg)  BMI 22.72 kg/m2  General: Alert, oriented, no distress.  Skin: normal turgor, no rashes HEENT: Normocephalic, atraumatic. Pupils round and reactive; sclera anicteric;no lid lag.  Nose without nasal septal hypertrophy Mouth/Parynx benign; Mallinpatti scale 3 Neck: No JVD, no carotid briuts Lungs: clear to ausculatation and percussion; no wheezing or rales Heart: RRR, s1 s2 normal 6 systolic murmur; no ectopy on prolonged auscultation Abdomen: soft, nontender; no hepatosplenomehaly, BS+; abdominal aorta nontender and not dilated by palpation. Pulses 2+ Extremities: no clubbing cyanosis or edema, Homan's sign negative  Neurologic: grossly nonfocal  ECG: Sinus rhythm at 61 beats per minute. QTc interval 406 ms. PR interval normal at 168 ms.  LABS:  BMET    Component Value Date/Time   NA 139 11/29/2012 1736   K 4.1 11/29/2012 1736   CL 101 11/29/2012 1736   CO2 26 11/29/2012 1736   GLUCOSE 106* 11/29/2012 1736   BUN 14 11/29/2012 1736   CREATININE 0.79 11/29/2012 2241   CALCIUM 10.2 11/29/2012 1736   GFRNONAA 78* 11/29/2012 2241   GFRAA >90 11/29/2012 2241     Hepatic Function Panel     Component Value Date/Time   PROT 7.2 05/11/2010 0436   ALBUMIN 3.3* 05/11/2010 0436   AST 18 05/11/2010 0436   ALT 18 05/11/2010 0436   ALKPHOS 66 05/11/2010 0436   BILITOT 0.6 05/11/2010 0436     CBC    Component Value Date/Time   WBC 11.4* 11/29/2012 2241   RBC 4.28 11/29/2012 2241     HGB 13.5 11/29/2012 2241   HCT 38.2 11/29/2012 2241   PLT 264 11/29/2012 2241   MCV 89.3 11/29/2012 2241   MCH 31.5 11/29/2012 2241   MCHC 35.3 11/29/2012 2241   RDW 13.7 11/29/2012 2241   LYMPHSABS 1.9 11/29/2012 1736   MONOABS 0.8 11/29/2012 1736   EOSABS 0.0 11/29/2012 1736   BASOSABS 0.0 11/29/2012 1736     BNP    Component Value Date/Time   PROBNP 178.6 11/29/2012 1736    Lipid Panel  No results found for this basename: chol,  trig,  hdl,  cholhdl,  vldl,  ldlcalc     RADIOLOGY: No results found.  ASSESSMENT AND PLAN: Janet Nguyen is 79 years old. Has a history of documented SVT and PACs/PVCs which have been controlled in the past with beta blocker therapy. Since further titrating her Toprol to 75 mg in the morning 50 mg at night she denies any breakthrough awareness of palpitations. She still has some issues with sleep. However, she is having dreams nicely his able to achieve REM sleep. She alternates using her mouth guard as well as her customize dental appliance is followed by Dr. Ron Parker. She's refused CPAP therapy and has had attempts in the past abuse all unsuccessful. We did discuss addictive potential of taking alprazolam doors open. Presently her blood pressure is well-controlled. She does not have signs of heart failure. An echo Doppler study in March 2014 showed an ejection fraction of 60-65%. She did have mild pulmonary hypertension. I have recommended she try resuming baby aspirin 81 mg every other day if possible as long as he does not have contraindications to this. I will see her in 6 months for cardiology reevaluation.    Troy Sine, MD, [facility]  07/13/2013 10:33 AM

## 2013-07-13 NOTE — Patient Instructions (Signed)
Your physician recommends that you schedule a follow-up appointment in: 6 MONTHS. Your physician has recommended you make the following change in your medication: resume a baby aspirin.

## 2013-07-14 ENCOUNTER — Encounter: Payer: Self-pay | Admitting: Emergency Medicine

## 2013-08-02 DIAGNOSIS — M542 Cervicalgia: Secondary | ICD-10-CM | POA: Diagnosis not present

## 2013-08-04 DIAGNOSIS — H02839 Dermatochalasis of unspecified eye, unspecified eyelid: Secondary | ICD-10-CM | POA: Diagnosis not present

## 2013-08-04 DIAGNOSIS — H04129 Dry eye syndrome of unspecified lacrimal gland: Secondary | ICD-10-CM | POA: Diagnosis not present

## 2013-08-04 DIAGNOSIS — H1030 Unspecified acute conjunctivitis, unspecified eye: Secondary | ICD-10-CM | POA: Diagnosis not present

## 2013-08-04 DIAGNOSIS — H40019 Open angle with borderline findings, low risk, unspecified eye: Secondary | ICD-10-CM | POA: Diagnosis not present

## 2013-08-11 DIAGNOSIS — M542 Cervicalgia: Secondary | ICD-10-CM | POA: Diagnosis not present

## 2013-08-18 DIAGNOSIS — Z1231 Encounter for screening mammogram for malignant neoplasm of breast: Secondary | ICD-10-CM | POA: Diagnosis not present

## 2013-08-29 DIAGNOSIS — Z130 Encounter for screening for diseases of the blood and blood-forming organs and certain disorders involving the immune mechanism: Secondary | ICD-10-CM | POA: Diagnosis not present

## 2013-08-29 DIAGNOSIS — Z9189 Other specified personal risk factors, not elsewhere classified: Secondary | ICD-10-CM | POA: Diagnosis not present

## 2013-08-29 DIAGNOSIS — N760 Acute vaginitis: Secondary | ICD-10-CM | POA: Diagnosis not present

## 2013-09-12 DIAGNOSIS — IMO0002 Reserved for concepts with insufficient information to code with codable children: Secondary | ICD-10-CM | POA: Diagnosis not present

## 2013-09-12 DIAGNOSIS — IMO0001 Reserved for inherently not codable concepts without codable children: Secondary | ICD-10-CM | POA: Diagnosis not present

## 2013-09-12 DIAGNOSIS — M353 Polymyalgia rheumatica: Secondary | ICD-10-CM | POA: Diagnosis not present

## 2013-09-12 DIAGNOSIS — M810 Age-related osteoporosis without current pathological fracture: Secondary | ICD-10-CM | POA: Diagnosis not present

## 2013-09-19 DIAGNOSIS — H1045 Other chronic allergic conjunctivitis: Secondary | ICD-10-CM | POA: Diagnosis not present

## 2013-09-19 DIAGNOSIS — H01009 Unspecified blepharitis unspecified eye, unspecified eyelid: Secondary | ICD-10-CM | POA: Diagnosis not present

## 2013-09-19 DIAGNOSIS — H40029 Open angle with borderline findings, high risk, unspecified eye: Secondary | ICD-10-CM | POA: Diagnosis not present

## 2013-09-19 DIAGNOSIS — H04129 Dry eye syndrome of unspecified lacrimal gland: Secondary | ICD-10-CM | POA: Diagnosis not present

## 2013-09-19 DIAGNOSIS — H43399 Other vitreous opacities, unspecified eye: Secondary | ICD-10-CM | POA: Diagnosis not present

## 2013-09-19 DIAGNOSIS — H2510 Age-related nuclear cataract, unspecified eye: Secondary | ICD-10-CM | POA: Diagnosis not present

## 2013-09-20 DIAGNOSIS — R232 Flushing: Secondary | ICD-10-CM | POA: Diagnosis not present

## 2013-09-29 HISTORY — PX: OTHER SURGICAL HISTORY: SHX169

## 2013-10-18 ENCOUNTER — Other Ambulatory Visit: Payer: Self-pay | Admitting: Cardiovascular Disease

## 2013-10-24 DIAGNOSIS — H2510 Age-related nuclear cataract, unspecified eye: Secondary | ICD-10-CM | POA: Diagnosis not present

## 2013-11-07 ENCOUNTER — Other Ambulatory Visit: Payer: Self-pay

## 2013-11-07 DIAGNOSIS — B370 Candidal stomatitis: Secondary | ICD-10-CM | POA: Diagnosis not present

## 2013-11-07 DIAGNOSIS — J029 Acute pharyngitis, unspecified: Secondary | ICD-10-CM | POA: Diagnosis not present

## 2013-11-07 NOTE — Telephone Encounter (Signed)
Please advise 

## 2013-11-09 ENCOUNTER — Ambulatory Visit (INDEPENDENT_AMBULATORY_CARE_PROVIDER_SITE_OTHER): Payer: Medicare Other | Admitting: Cardiovascular Disease

## 2013-11-09 ENCOUNTER — Encounter: Payer: Self-pay | Admitting: Cardiovascular Disease

## 2013-11-09 VITALS — BP 142/84 | HR 62 | Ht 63.0 in | Wt 129.4 lb

## 2013-11-09 DIAGNOSIS — M797 Fibromyalgia: Secondary | ICD-10-CM

## 2013-11-09 DIAGNOSIS — R0789 Other chest pain: Secondary | ICD-10-CM

## 2013-11-09 DIAGNOSIS — M4120 Other idiopathic scoliosis, site unspecified: Secondary | ICD-10-CM

## 2013-11-09 DIAGNOSIS — M419 Scoliosis, unspecified: Secondary | ICD-10-CM

## 2013-11-09 DIAGNOSIS — G4730 Sleep apnea, unspecified: Secondary | ICD-10-CM

## 2013-11-09 DIAGNOSIS — R002 Palpitations: Secondary | ICD-10-CM

## 2013-11-09 DIAGNOSIS — IMO0001 Reserved for inherently not codable concepts without codable children: Secondary | ICD-10-CM

## 2013-11-09 DIAGNOSIS — I4892 Unspecified atrial flutter: Secondary | ICD-10-CM | POA: Diagnosis not present

## 2013-11-09 MED ORDER — METOPROLOL SUCCINATE ER 25 MG PO TB24: ORAL_TABLET | ORAL | Status: DC

## 2013-11-09 NOTE — Patient Instructions (Addendum)
Your physician has recommended you make the following change in your medication: increase the toprol to 75 mg twice daily.  Your physician recommends that you schedule a follow-up appointment in: 6 months

## 2013-11-09 NOTE — Progress Notes (Signed)
Patient ID: Janet Nguyen, female   DOB: 20-Dec-1931, 80 y.o.   MRN: [id number]       HPI: Janet Nguyen, is a 80 y.o. female  who presents for followup cardiology evaluation. I last saw her 3 months ago for recurrent palpitations.  Ms. Mellette has a history of documented SVT and also has PACs and PVCs which have been treated with beta blocker therapy. She has a history of obstructive sleep apnea but despite multiple attempts at CPAP utilization she has not been able to tolerate this. She was referred to Dr. [name] and has a customized dental appliance with mandibular advancement with improvement in some of her symptomatology. Due concern that her teeth may be moving in more recently she has has not been using customized appliance daily but alternate using this with her old mouthguard.  When I  saw Ms. Avitia in April 2014 I further titrated her beta blocker therapy after cardiac event monitor did show several bursts of recurrent SVT up to 177 beats per minute in March. She has been on Toprol XL 50 mg twice a day and has a when necessary metoprolol tartrate that she takes in addition with recurrent episodes. In July after she had had 2 episodes of chest fluttering which each lasted over 30 minutes which he did take metoprolol tartrate with relief I recommended further titration of her Toprol to 75 mg in the morning and 50 mg at night and if necessary she could further titrate this to 75 twice a day.  I last saw her the 75 mg n the morning 50 mg at night dose had  completely resolved her palpitations.  However, recently, she again has noticed recurrence of palpitations typically associate lies down before she falls asleep and at times this may last minutes to hours. She recently was started on Diflucan by Dr. Serina Cowper for oral thrush. She states that being a [place] dissent she typically has black tea in the morning and then typically at 4 PM. She has been taking her evening dose of Toprol  XL at approximately 8 PM and typically goes to bed between 10 and 11. She has not taken any when necessary metoprolol tartrate for these recurrent symptoms. She presents now for evaluation.  She also states that her scoliosis is getting worse. She does have left-sided musculoskeletal type chest pain due to her spine angulation.    Past Medical History  Diagnosis Date  . Irregular heart beat 11/30/12    ECHO-EF 60-65%  . Celiac disease     treated by Dr. [name]  . GERD (gastroesophageal reflux disease)   . Osteoporosis   . Scoliosis   . Intervertebral disc stenosis of neural canal of cervical region   . Sleep apnea 10/02/11 [facility]    Sleep study AHI -total sleep 10.3/hr  64.0/ hr during REM sleep.RDI 22.8/hr during total sleep 64.0/hr during REM sleep The lowest O2 sat during Non-REM and REM sleep was 86% and 88% respectively. 04/08/12 CPAP/BIPAP titration study [facility]  . Scoliosis   . Arrhythmia     History of SVT with documented PVC'S and  PAC'S  12/08/12 Nuc stress test normal LV EF 74%  Event Monitor  12/01/12-01/03/13    Past Surgical History  Procedure Laterality Date  . Appendectomy      ruptured at age 37 and had surgery  . Cardiac catheterization  01/27/06    Allergies  Allergen Reactions  .  Proton Pump Inhibitors     allopecia  . Codeine Nausea Only  . Gluten Meal     Unknown    Current Outpatient Prescriptions  Medication Sig Dispense Refill  . acetaminophen (TYLENOL) 500 MG tablet Take 500 mg by mouth as needed for pain.      Marland Kitchen ALPRAZolam (XANAX) 0.25 MG tablet Take 0.25 mg by mouth as needed.       . calcium-vitamin D (OSCAL) 250-125 MG-UNIT per tablet Take 1 tablet by mouth daily.      Marland Kitchen esomeprazole (NEXIUM) 20 MG capsule Take 20 mg by mouth daily before breakfast.      . fluconazole (DIFLUCAN) 100 MG tablet Take 1 tablet by mouth. For 10 days      . metoprolol succinate (TOPROL-XL) 25 MG 24 hr tablet TAKE 3 TABLETS  IN THE AM AND 2 TABLETS IN THE PM  150 tablet  6  . metoprolol tartrate (LOPRESSOR) 25 MG tablet Take only when having palpitation      . Multiple Vitamin (MULTIVITAMIN WITH MINERALS) TABS Take 1 tablet by mouth daily.      Marland Kitchen zolpidem (AMBIEN) 5 MG tablet TAKE ONE TABLET BY MOUTH AT BEDTIME  30 tablet  1   No current facility-administered medications for this visit.    Socially she is divorced has 4 children 9 grandchildren. She does exercise. No tobacco use. She does occasional wine  ROS is negative for fevers, chills or night sweats. He wears glasses. She denies change in vision or hearing. She does have a URI. Previously she had been on prednisone by Dr. Amil Amen for an elevated CRP and it was felt that this may have been the inciting agent that contribute to the development of some early thrush. She now is off prednisone therapy. She denies presyncope or syncope. She denies cough. She has made congestion. She denies wheezing. She does note some mild shortness of breath intermittently. She denies chest tightness. He does admit to some mild left lateral chest wall discomfort to palpation. She denies nausea vomiting or diarrhea. She denies blood in stool or urine. She had noticed some mild leg swelling. She denies tremors or rash. She has sleep apnea. Other comprehensive 14 point system review is negative.   PE BP 142/84  Pulse 62  Ht 5' 3"  (1.6 m)  Wt 129 lb 6.4 oz (58.695 kg)  BMI 22.93 kg/m2  General: Alert, oriented, no distress.  Skin: normal turgor, no rashes HEENT: Normocephalic, atraumatic. Pupils round and reactive; sclera anicteric;no lid lag.  Nose without nasal septal hypertrophy Mouth/Parynx benign; Mallinpatti scale 3 I did not see any thrush on inspection of her oropharynx. Neck: No JVD, no carotid bruits without carotid upstroke Lungs: clear to ausculatation and percussion; no wheezing or rales Chest wall: Mild tenderness to palpation in the lateral left pectoral region/rib.  She does have significant scoliosis. Heart: RRR, s1 s2 normal 6 systolic murmur; no ectopy on prolonged auscultation Abdomen: soft, nontender; no hepatosplenomehaly, BS+; abdominal aorta nontender and not dilated by palpation. Back: No CVA tenderness. Pulses 2+ Extremities: no clubbing cyanosis or edema, Homan's sign negative  Neurologic: grossly nonfocal  ECG (independently read by me): Normal sinus rhythm at 62 beats per minute. Normal intervals. QTc interval 399 ms.  Prior ECG of 07/13/2013: Sinus rhythm at 61 beats per minute. QTc interval 406 ms. PR interval normal at 168 ms.  LABS:  BMET    Component Value Date/Time   NA 139 11/29/2012 1736   K  4.1 11/29/2012 1736   CL 101 11/29/2012 1736   CO2 26 11/29/2012 1736   GLUCOSE 106* 11/29/2012 1736   BUN 14 11/29/2012 1736   CREATININE 0.79 11/29/2012 2241   CALCIUM 10.2 11/29/2012 1736   GFRNONAA 78* 11/29/2012 2241   GFRAA >90 11/29/2012 2241     Hepatic Function Panel     Component Value Date/Time   PROT 7.2 05/11/2010 0436   ALBUMIN 3.3* 05/11/2010 0436   AST 18 05/11/2010 0436   ALT 18 05/11/2010 0436   ALKPHOS 66 05/11/2010 0436   BILITOT 0.6 05/11/2010 0436     CBC    Component Value Date/Time   WBC 11.4* 11/29/2012 2241   RBC 4.28 11/29/2012 2241   HGB 13.5 11/29/2012 2241   HCT 38.2 11/29/2012 2241   PLT 264 11/29/2012 2241   MCV 89.3 11/29/2012 2241   MCH 31.5 11/29/2012 2241   MCHC 35.3 11/29/2012 2241   RDW 13.7 11/29/2012 2241   LYMPHSABS 1.9 11/29/2012 1736   MONOABS 0.8 11/29/2012 1736   EOSABS 0.0 11/29/2012 1736   BASOSABS 0.0 11/29/2012 1736     BNP    Component Value Date/Time   PROBNP 178.6 11/29/2012 1736    Lipid Panel  No results found for this basename: chol,  trig,  hdl,  cholhdl,  vldl,  ldlcalc     RADIOLOGY: No results found.    ASSESSMENT AND PLAN: Ms. Scullin is 80 years old female with a history of documented SVT and PACs/PVCs which have been controlled in the past with beta blocker therapy. He has documented  normal systolic function on her last echo in March 2014 but she did have grade 1 diastolic dysfunction. She did have significant left atrial dilatation and mild dilatation of the right ventricle with moderate dilatation of the right atrium. She did have mild/moderate pulmonary hypertension with PA pressures of 42 mm. Presently, she has developed recurrent episodes of palpitation typically when she lies down at night getting ready to sleep. She has been taking her evening dose of metoprolol succinate at approximately 8 to 9 PM. I am recommending that she increase this dose to 75 mg to take this at approximately 5 to 6 PM instead. Also recommended that she avoid caffeine use from approximately 1:00 in the afternoon since it's possible this also may be contributing to some of her symptomatology. She now is on Diflucan for 10 days prescribed by Dr. Harrington Challenger. I did not see any thrush on exam today. She is no longer on prednisone therapy. She does have URI symptoms. She will make these medication and lifestyle adjustments. Her chest discomfort is musculoskeletal and not ischemic. I will see her in 6 months for followup cardiology evaluation.   Troy Sine, MD, [facility]  11/09/2013 11:59 AM

## 2013-11-11 MED ORDER — ZOLPIDEM TARTRATE 5 MG PO TABS: 5.0000 mg | ORAL_TABLET | Freq: Every evening | ORAL | Status: DC | PRN

## 2013-11-11 NOTE — Telephone Encounter (Signed)
Dominica Severin, LPN phoned refill into pharmacy.

## 2013-12-06 DIAGNOSIS — H2510 Age-related nuclear cataract, unspecified eye: Secondary | ICD-10-CM | POA: Diagnosis not present

## 2013-12-06 DIAGNOSIS — H269 Unspecified cataract: Secondary | ICD-10-CM | POA: Diagnosis not present

## 2013-12-07 ENCOUNTER — Ambulatory Visit: Payer: Medicare Other | Admitting: Cardiovascular Disease

## 2013-12-12 ENCOUNTER — Telehealth: Payer: Self-pay | Admitting: Cardiovascular Disease

## 2013-12-12 NOTE — Telephone Encounter (Signed)
returned call to patient. Dr. [name] has her taking 34m Toprol XL BID and metoprolol tartrate for breakthrough palpitations/fluttering. Was questioning if OK to take both, tartrate just as need. Reassured patient of the instructions, per Dr. [place]ette [place] to use meto tart as needed - of which patient states she notices the fluttering more at night when lying down. Patient will call back if using this beta blocker as needed is not helpful.

## 2013-12-12 NOTE — Telephone Encounter (Signed)
Have a question about her metoprolol tart that she is taking  .Marland Kitchen Please call .Marland Kitchen Has to go out for an hour .Marland Kitchen

## 2013-12-14 DIAGNOSIS — M5137 Other intervertebral disc degeneration, lumbosacral region: Secondary | ICD-10-CM | POA: Diagnosis not present

## 2013-12-14 DIAGNOSIS — M4120 Other idiopathic scoliosis, site unspecified: Secondary | ICD-10-CM | POA: Diagnosis not present

## 2013-12-14 DIAGNOSIS — M76899 Other specified enthesopathies of unspecified lower limb, excluding foot: Secondary | ICD-10-CM | POA: Diagnosis not present

## 2013-12-14 DIAGNOSIS — M47817 Spondylosis without myelopathy or radiculopathy, lumbosacral region: Secondary | ICD-10-CM | POA: Diagnosis not present

## 2013-12-15 ENCOUNTER — Ambulatory Visit: Payer: Medicare Other | Admitting: Cardiovascular Disease

## 2013-12-16 ENCOUNTER — Other Ambulatory Visit: Payer: Self-pay | Admitting: Cardiovascular Disease

## 2013-12-16 NOTE — Telephone Encounter (Signed)
Rx was sent to pharmacy electronically. 

## 2013-12-22 DIAGNOSIS — M48061 Spinal stenosis, lumbar region without neurogenic claudication: Secondary | ICD-10-CM | POA: Diagnosis not present

## 2013-12-22 DIAGNOSIS — M4120 Other idiopathic scoliosis, site unspecified: Secondary | ICD-10-CM | POA: Diagnosis not present

## 2013-12-22 DIAGNOSIS — M4802 Spinal stenosis, cervical region: Secondary | ICD-10-CM | POA: Diagnosis not present

## 2013-12-28 ENCOUNTER — Encounter: Payer: Self-pay | Admitting: Cardiology

## 2013-12-28 ENCOUNTER — Ambulatory Visit (INDEPENDENT_AMBULATORY_CARE_PROVIDER_SITE_OTHER): Payer: Medicare Other | Admitting: Cardiology

## 2013-12-28 VITALS — BP 120/70 | HR 67 | Ht 62.0 in | Wt 127.0 lb

## 2013-12-28 DIAGNOSIS — I493 Ventricular premature depolarization: Secondary | ICD-10-CM

## 2013-12-28 DIAGNOSIS — I4949 Other premature depolarization: Secondary | ICD-10-CM

## 2013-12-28 DIAGNOSIS — I491 Atrial premature depolarization: Secondary | ICD-10-CM

## 2013-12-28 DIAGNOSIS — I4892 Unspecified atrial flutter: Secondary | ICD-10-CM | POA: Diagnosis not present

## 2013-12-28 DIAGNOSIS — I498 Other specified cardiac arrhythmias: Secondary | ICD-10-CM

## 2013-12-28 DIAGNOSIS — I471 Supraventricular tachycardia: Secondary | ICD-10-CM

## 2013-12-28 DIAGNOSIS — R002 Palpitations: Secondary | ICD-10-CM | POA: Diagnosis not present

## 2013-12-28 NOTE — Patient Instructions (Signed)
Call for EP evaluation if your palpitations become more bothersome

## 2013-12-28 NOTE — Progress Notes (Signed)
12/28/2013 ENEIDA EVERS   08/04/1932  [id number]  Primary Physicia  Melinda Crutch, MD Primary Cardiologist: Dr [name]  HPI:  Ms. Kawa has a history of documented SVT and also has PACs and PVCs which have been treated with beta blocker therapy. She has a history of obstructive sleep apnea but despite multiple attempts at CPAP utilization she has not been able to tolerate this.  When Dr [name] saw Ms. Vary in April 2014 he further titrated her beta blocker therapy after cardiac event monitor did show several bursts of recurrent SVT up to 177 beats per minute in March 2014.            She is in the office today with complaints of palpitations and runs of tachycardia. She says these runs last about 10  Minutes. She takes an extra Metoprolol for this. She denies any sustained tachycardia.     Current Outpatient Prescriptions  Medication Sig Dispense Refill  . acetaminophen (TYLENOL) 500 MG tablet Take 500 mg by mouth as needed for pain.      Marland Kitchen ALPRAZolam (XANAX) 0.25 MG tablet Take 0.25 mg by mouth as needed.       . calcium-vitamin D (OSCAL) 250-125 MG-UNIT per tablet Take 1 tablet by mouth daily.      Marland Kitchen esomeprazole (NEXIUM) 20 MG capsule Take 20 mg by mouth daily before breakfast.      . metoprolol succinate (TOPROL-XL) 25 MG 24 hr tablet Take 3 tablets (75 mg total) by mouth 2 (two) times daily.  180 tablet  3  . metoprolol tartrate (LOPRESSOR) 25 MG tablet Take 25 mg by mouth daily.      . Multiple Vitamin (MULTIVITAMIN WITH MINERALS) TABS Take 1 tablet by mouth daily.      . nortriptyline (PAMELOR) 10 MG capsule       . zolpidem (AMBIEN) 5 MG tablet Take 1 tablet (5 mg total) by mouth at bedtime as needed for sleep.  90 tablet  0   No current facility-administered medications for this visit.    Allergies  Allergen Reactions  . Proton Pump Inhibitors     allopecia  . Codeine Nausea Only  . Gluten Meal     Unknown    History   Social History  . Marital Status: Divorced      Spouse Name: N/A    Number of Children: N/A  . Years of Education: N/A   Occupational History  . Not on file.   Social History Main Topics  . Smoking status: Never Smoker   . Smokeless tobacco: Not on file  . Alcohol Use: 0.5 oz/week    1 drink(s) per week  . Drug Use: No  . Sexual Activity: Not on file   Other Topics Concern  . Not on file   Social History Narrative  . No narrative on file     Review of Systems: General: negative for chills, fever, night sweats or weight changes.  Cardiovascular: negative for chest pain, dyspnea on exertion, edema, orthopnea, palpitations, paroxysmal nocturnal dyspnea or shortness of breath Dermatological: negative for rash Respiratory: negative for cough or wheezing Urologic: negative for hematuria Abdominal: negative for nausea, vomiting, diarrhea, bright red blood per rectum, melena, or hematemesis Neurologic: negative for visual changes, syncope, or dizziness All other systems reviewed and are otherwise negative except as noted above.    Blood pressure 120/70, pulse 67, height 5' 2"  (1.575 m), weight 127 lb (57.607 kg).  General appearance: alert, cooperative and  no distress Lungs: clear to auscultation bilaterally and kyphoscoliosis Heart: regular rate and rhythm  EKG NSR, PVC, PAC  ASSESSMENT AND PLAN:   Heart palpitations She has been taking extra Lopressor prn  Atrial flutter with rapid ventricular response This was reported but never documented on an ER visit March 2014  PSVT (paroxysmal supraventricular tachycardia) On Holter 3/14  Premature atrial contractions .  Frequent PVCs .   PLAN  Mainly reassurance. I suggested if her palpitations become worse, or she feels she would like this evaluated further, we can refer her to EP. It may be a low dose of an antiarrythmic would be helpful. At this time she declines referral.   Kerin Ransom KPA-C 12/28/2013 5:08 PM

## 2013-12-28 NOTE — Assessment & Plan Note (Signed)
This was reported but never documented on an ER visit March 2014

## 2013-12-28 NOTE — Assessment & Plan Note (Signed)
On Holter 3/14

## 2013-12-28 NOTE — Assessment & Plan Note (Signed)
She has been taking extra Lopressor prn

## 2014-01-04 DIAGNOSIS — R1013 Epigastric pain: Secondary | ICD-10-CM | POA: Diagnosis not present

## 2014-01-04 DIAGNOSIS — M4120 Other idiopathic scoliosis, site unspecified: Secondary | ICD-10-CM | POA: Diagnosis not present

## 2014-01-04 DIAGNOSIS — K219 Gastro-esophageal reflux disease without esophagitis: Secondary | ICD-10-CM | POA: Diagnosis not present

## 2014-01-06 DIAGNOSIS — H25019 Cortical age-related cataract, unspecified eye: Secondary | ICD-10-CM | POA: Diagnosis not present

## 2014-01-06 DIAGNOSIS — H2510 Age-related nuclear cataract, unspecified eye: Secondary | ICD-10-CM | POA: Diagnosis not present

## 2014-01-10 ENCOUNTER — Telehealth: Payer: Self-pay | Admitting: Pharmacist Clinician (PhC)/ Clinical Pharmacy Specialist

## 2014-01-10 MED ORDER — METOPROLOL TARTRATE 25 MG PO TABS: 75.0000 mg | ORAL_TABLET | Freq: Two times a day (BID) | ORAL | Status: DC

## 2014-01-10 NOTE — Telephone Encounter (Signed)
Pt insurance won't allow quantity of Metoprolol succinate - pt takes 30m bid, but will only take 224mtablets, due to difficulty swallowing.    Reviewed with Dr. KeClaiborne Billingswill switch to metoprolol tartrate 7519mid and leave it at 70m72mblets.

## 2014-01-12 ENCOUNTER — Telehealth: Payer: Self-pay | Admitting: *Deleted

## 2014-01-12 NOTE — Telephone Encounter (Signed)
Sent in [place] request for patient's Metoprolol SUCC ER 25 mg. To [employer] via covermymeds. Will await response.

## 2014-01-16 ENCOUNTER — Telehealth: Payer: Self-pay | Admitting: Cardiovascular Disease

## 2014-01-16 NOTE — Telephone Encounter (Signed)
Have a question about her metoprolol...Marland KitchenMarland KitchenThanks

## 2014-01-16 NOTE — Telephone Encounter (Signed)
Returned call to patient. Had been taking metoprolol succinate 69m BID (274mtablets) but insurance didn't want to cover so patient was switched to metoprolol tartrate 7529mID (66m24mblets). Patient was questioning the effectiveness of this switch since metoprolol succinate is a long acting medication and metoprolol tartrate is short acting, since the medication change was a direct dose and freq change despite being a change from long to short acting pill.   Also, patient needs to be notified of outcome of prior authorization. Will defer to [place] and Dr. KellClaiborne Billings

## 2014-01-17 ENCOUNTER — Telehealth: Payer: Self-pay | Admitting: Cardiovascular Disease

## 2014-01-17 DIAGNOSIS — H2510 Age-related nuclear cataract, unspecified eye: Secondary | ICD-10-CM | POA: Diagnosis not present

## 2014-01-17 DIAGNOSIS — H269 Unspecified cataract: Secondary | ICD-10-CM | POA: Diagnosis not present

## 2014-01-17 NOTE — Telephone Encounter (Signed)
LMTCB  Metoprolol succinate to metoprolol tartrate conversion is to take total dose of succinate over 24 hours and divide into 2 doses of metoprolol tartrate, per Erasmo Downer.

## 2014-01-17 NOTE — Telephone Encounter (Signed)
Patient called in yesterday questioning the recent medication change from metoprolol succinate 65m BID to metoprolol tartrate 768mBID (since insurance would not cover metoprolol succinate in the dose/freq she takes it). She was concerned since she took such a high/more frequent dose of an extended-released beta blocker and is now taking a short-acting beta blocker but the dose/freq remained the same and she had been using metoprolol tartrate as a breakthrough for palpitations. [facility] review med/dose/freq conversion, so RN can reassure patient on effectiveness.

## 2014-01-17 NOTE — Telephone Encounter (Signed)
Please call-wants to know if you found out anything about her medicine?

## 2014-01-17 NOTE — Telephone Encounter (Signed)
Will start at 75 mg bid of tartrate, but may need to titrate depending on clinical response.

## 2014-01-18 NOTE — Telephone Encounter (Signed)
Returning your call. °

## 2014-01-18 NOTE — Telephone Encounter (Signed)
Spoke with patient to notify her of Dr. Evette Georges decision on her lopressor. She would like for me to discuss this with him again because she has been taking the "succ ER" 75 mg twice a day although she has a bottle that says 75 mg in the AM and 50 mg in the PM. I informed patient that I will discuss this again with Dr. [name] and advise her of his decision.

## 2014-01-18 NOTE — Telephone Encounter (Signed)
Discuss at [facility] tomorrow

## 2014-01-18 NOTE — Telephone Encounter (Signed)
Janet Nguyen, CMA spoke with patient about medication management 01/18/14

## 2014-01-18 NOTE — Telephone Encounter (Signed)
Dr. [name] please read and advise. I informed her of your previous message and she would like for me to give you additional information.

## 2014-01-19 ENCOUNTER — Telehealth: Payer: Self-pay | Admitting: Cardiovascular Disease

## 2014-01-19 NOTE — Telephone Encounter (Signed)
Will defer to [facility] and Dr. [name].  Mariann Laster spoke w/ pt yesterday who asked Mariann Laster to discuss further w/ Dr. [name].

## 2014-01-19 NOTE — Telephone Encounter (Signed)
Another option is Metoprolol 50 mg at 1 1/2 taking 2 times a day.Would this not be a better way?

## 2014-01-19 NOTE — Telephone Encounter (Signed)
As mentioned; lopressor75 mf bid is 1 1/2 of 50 mg twice a day. If she does not want to take this , resume how she has already been taking the succinate.

## 2014-01-20 ENCOUNTER — Telehealth: Payer: Self-pay | Admitting: *Deleted

## 2014-01-20 ENCOUNTER — Telehealth: Payer: Self-pay | Admitting: Cardiovascular Disease

## 2014-01-20 ENCOUNTER — Other Ambulatory Visit: Payer: Self-pay | Admitting: *Deleted

## 2014-01-20 MED ORDER — METOPROLOL SUCCINATE ER 50 MG PO TB24
ORAL_TABLET | ORAL | Status: DC
Start: 2014-01-20 — End: 2015-01-02

## 2014-01-20 NOTE — Telephone Encounter (Signed)
Called patient to inform her that I received the rejection from the pharmacy for the metoprolol ER 50 mg. PA sent to [facility]. Will have to wait for their decision. I will call her to inform her of their decision once they send it to me.

## 2014-01-20 NOTE — Telephone Encounter (Signed)
telephoned patient  To inform her once again of Dr. Evette Georges recommendations. Patient became upset and started to cry saying that she doesn't feel that he is getting her messages correctly. I assured her that he is getting her messages, however she isn't quite understanding what he and Cyril Mourning has been explaining to her about the formulation of the two medications. At this point I asked the patient I asked the patient what does she want to take. She says that she wants to take metoprolol succ er 50 mg 1.5 tablets twice daily. I spoke with Cyril Mourning and she said okay to call prescription in. In the meantime Dr. [name] called me about another issue and I informed him of the patient's request. He said fine. Go ahead and give the patient what she was requesting. Prescription for a 90 day supply sent to [place] on patient's file.

## 2014-01-20 NOTE — Telephone Encounter (Signed)
Patient returned phone call from message that I left. I explained to her that I was actually returning a call that said she called me today at 10:34. She informs me this must be a old message.

## 2014-01-20 NOTE — Telephone Encounter (Signed)
Is wanting to discuss another option with you about her medications . Please call

## 2014-01-20 NOTE — Telephone Encounter (Signed)
Refilled metoprolol succ ER 50 mg to [facility].

## 2014-01-23 ENCOUNTER — Telehealth: Payer: Self-pay | Admitting: *Deleted

## 2014-01-23 NOTE — Telephone Encounter (Signed)
Left message that [facility] has approved the metoprolol succ 50 mg 1.5 tab BID. Telephoned [facility] to inform them of the approval. Pharmacist informed me that he actually received it on Friday and the patient has already picked it up.

## 2014-03-02 DIAGNOSIS — R109 Unspecified abdominal pain: Secondary | ICD-10-CM | POA: Diagnosis not present

## 2014-03-09 DIAGNOSIS — R300 Dysuria: Secondary | ICD-10-CM | POA: Diagnosis not present

## 2014-03-09 DIAGNOSIS — F411 Generalized anxiety disorder: Secondary | ICD-10-CM | POA: Diagnosis not present

## 2014-03-09 DIAGNOSIS — J0100 Acute maxillary sinusitis, unspecified: Secondary | ICD-10-CM | POA: Diagnosis not present

## 2014-03-09 DIAGNOSIS — G479 Sleep disorder, unspecified: Secondary | ICD-10-CM | POA: Diagnosis not present

## 2014-03-09 DIAGNOSIS — K219 Gastro-esophageal reflux disease without esophagitis: Secondary | ICD-10-CM | POA: Diagnosis not present

## 2014-03-09 DIAGNOSIS — M4120 Other idiopathic scoliosis, site unspecified: Secondary | ICD-10-CM | POA: Diagnosis not present

## 2014-03-09 DIAGNOSIS — Z0000 Encounter for general adult medical examination without abnormal findings: Secondary | ICD-10-CM | POA: Diagnosis not present

## 2014-03-09 DIAGNOSIS — M353 Polymyalgia rheumatica: Secondary | ICD-10-CM | POA: Diagnosis not present

## 2014-03-09 DIAGNOSIS — Z23 Encounter for immunization: Secondary | ICD-10-CM | POA: Diagnosis not present

## 2014-03-20 DIAGNOSIS — R300 Dysuria: Secondary | ICD-10-CM | POA: Diagnosis not present

## 2014-03-20 DIAGNOSIS — Z79899 Other long term (current) drug therapy: Secondary | ICD-10-CM | POA: Diagnosis not present

## 2014-03-20 DIAGNOSIS — Z0000 Encounter for general adult medical examination without abnormal findings: Secondary | ICD-10-CM | POA: Diagnosis not present

## 2014-03-20 DIAGNOSIS — K219 Gastro-esophageal reflux disease without esophagitis: Secondary | ICD-10-CM | POA: Diagnosis not present

## 2014-03-20 DIAGNOSIS — G479 Sleep disorder, unspecified: Secondary | ICD-10-CM | POA: Diagnosis not present

## 2014-03-20 DIAGNOSIS — M353 Polymyalgia rheumatica: Secondary | ICD-10-CM | POA: Diagnosis not present

## 2014-03-20 DIAGNOSIS — F411 Generalized anxiety disorder: Secondary | ICD-10-CM | POA: Diagnosis not present

## 2014-03-20 DIAGNOSIS — Z136 Encounter for screening for cardiovascular disorders: Secondary | ICD-10-CM | POA: Diagnosis not present

## 2014-03-29 ENCOUNTER — Ambulatory Visit (INDEPENDENT_AMBULATORY_CARE_PROVIDER_SITE_OTHER): Payer: Medicare Other | Admitting: Cardiovascular Disease

## 2014-03-29 ENCOUNTER — Encounter: Payer: Self-pay | Admitting: Cardiovascular Disease

## 2014-03-29 VITALS — BP 148/86 | HR 62 | Ht 63.0 in | Wt 129.6 lb

## 2014-03-29 DIAGNOSIS — I493 Ventricular premature depolarization: Secondary | ICD-10-CM

## 2014-03-29 DIAGNOSIS — I4892 Unspecified atrial flutter: Secondary | ICD-10-CM | POA: Diagnosis not present

## 2014-03-29 DIAGNOSIS — I4949 Other premature depolarization: Secondary | ICD-10-CM

## 2014-03-29 DIAGNOSIS — IMO0001 Reserved for inherently not codable concepts without codable children: Secondary | ICD-10-CM

## 2014-03-29 DIAGNOSIS — M419 Scoliosis, unspecified: Secondary | ICD-10-CM

## 2014-03-29 DIAGNOSIS — G4730 Sleep apnea, unspecified: Secondary | ICD-10-CM | POA: Diagnosis not present

## 2014-03-29 DIAGNOSIS — M797 Fibromyalgia: Secondary | ICD-10-CM

## 2014-03-29 DIAGNOSIS — M4120 Other idiopathic scoliosis, site unspecified: Secondary | ICD-10-CM

## 2014-03-29 DIAGNOSIS — R002 Palpitations: Secondary | ICD-10-CM

## 2014-03-29 NOTE — Patient Instructions (Signed)
Your physician recommends that you schedule a follow-up appointment in: 1 year. No changes were made today in your therapy.

## 2014-03-30 ENCOUNTER — Encounter: Payer: Self-pay | Admitting: Cardiovascular Disease

## 2014-03-30 NOTE — Progress Notes (Signed)
Patient ID: Janet Nguyen, female   DOB: [birth year], 80 y.o.   MRN: [id number]       HPI: Janet Nguyen is a 80 y.o. female  who presents for followup cardiology evaluation. I last saw her 5 months ago for recurrent palpitations.  Ms. House has a history of documented SVT and also has PACs and PVCs which have been treated with beta blocker therapy. She has a history of obstructive sleep apnea but despite multiple attempts at CPAP utilization she has not been able to tolerate this. She was referred to Dr. [name] and has a customized dental appliance with mandibular advancement with improvement in some of her symptomatology. Due concern that her teeth may be moving in more recently she has has not been using customized appliance daily but alternate using this with her old mouthguard.  When I  saw Ms. Hevener in April 2014 I further titrated her beta blocker therapy after cardiac event monitor did show several bursts of recurrent SVT up to 177 beats per minute in March. She has been on Toprol XL 50 mg twice a day and has a when necessary metoprolol tartrate that she takes in addition with recurrent episodes. In July after she had had 2 episodes of chest fluttering which each lasted over 30 minutes which he did take metoprolol tartrate with relief I recommended further titration of her Toprol to 75 mg in the morning and 50 mg at night and if necessary she could further titrate this to 75 twice a day. I last saw her, she had noticed recurrence of palpitations typically associate lies down before she falls asleep and at times this may last minutes to hours.  That time, she had been started on Diflucan by Dr. Harrington Challenger  for oral thrush. She states that being a [place] dissent she typically has black tea in the morning and then typically at 4 PM. She has been taking her evening dose of Toprol XL at approximately 8 PM and typically goes to bed between 10 and 11. She has not taken any when necessary metoprolol  tartrate for these recurrent symptoms. She presents now for evaluation.  She also states that her scoliosis is getting worse. She does have left-sided musculoskeletal type chest pain due to her spine angulation.  As I last saw her, he was concerned that her there may be thinning and she was wondering if her beta blocker was causing this.  She notes rare episodes of dizziness, but none over the past several months.  She is not sleeping well .  By using her hospitalized and non-customized oral appliances on alternating days.  She did have moderate sleep apnea noted in the past, but apparently could not tolerate several CPAP masks.  She never did try some of the newer masks, which are now available.    Past Medical History  Diagnosis Date  . Irregular heart beat 11/30/12    ECHO-EF 60-65%  . Celiac disease     treated by Dr. [name]  . GERD (gastroesophageal reflux disease)   . Osteoporosis   . Scoliosis   . Intervertebral disc stenosis of neural canal of cervical region   . Sleep apnea 10/02/11 [facility]    Sleep study AHI -total sleep 10.3/hr  64.0/ hr during REM sleep.RDI 22.8/hr during total sleep 64.0/hr during REM sleep The lowest O2 sat during Non-REM and REM sleep was 86% and 88% respectively. 04/08/12 CPAP/BIPAP titration study [facility]  .  Scoliosis   . Arrhythmia     History of SVT with documented PVC'S and  PAC'S  12/08/12 Nuc stress test normal LV EF 74%  Event Monitor  12/01/12-01/03/13    Past Surgical History  Procedure Laterality Date  . Appendectomy      ruptured at age 62 and had surgery  . Cardiac catheterization  01/27/06    Allergies  Allergen Reactions  . Proton Pump Inhibitors     allopecia  . Codeine Nausea Only  . Gluten Meal     Unknown    Current Outpatient Prescriptions  Medication Sig Dispense Refill  . acetaminophen (TYLENOL) 500 MG tablet Take 500 mg by mouth as needed for pain.      Marland Kitchen ALPRAZolam (XANAX) 0.25  MG tablet Take 0.25 mg by mouth as needed.       Marland Kitchen aspirin 81 MG tablet Take 81 mg by mouth daily.      . calcium-vitamin D (OSCAL) 250-125 MG-UNIT per tablet Take 1 tablet by mouth daily.      Marland Kitchen esomeprazole (NEXIUM) 20 MG capsule Take 20 mg by mouth every other day.       . meloxicam (MOBIC) 7.5 MG tablet Take 7.5 mg by mouth daily.      . metoprolol succinate (TOPROL-XL) 50 MG 24 hr tablet Take 1 & 1/2 tablet twice daily  270 tablet  3  . metoprolol tartrate (LOPRESSOR) 25 MG tablet Take 25 mg by mouth as needed.      . Multiple Vitamin (MULTIVITAMIN WITH MINERALS) TABS Take 1 tablet by mouth daily.      . nortriptyline (PAMELOR) 10 MG capsule Take 10 mg by mouth at bedtime.       Marland Kitchen zolpidem (AMBIEN) 5 MG tablet Take 1 tablet (5 mg total) by mouth at bedtime as needed for sleep.  90 tablet  0   No current facility-administered medications for this visit.    Socially she is divorced has 4 children 9 grandchildren. She does exercise. No tobacco use. She does occasional wine.  ROS General: Negative; No fevers, chills, or night sweats;  HEENT: Negative; No changes in vision or hearing, sinus congestion, difficulty swallowing Pulmonary: Negative; No cough, wheezing, shortness of breath, hemoptysis Cardiovascular: Positive for occasional chest wall pain;  presyncope, syncope, no recent palpitations GI: Negative; No nausea, vomiting, diarrhea, or abdominal pain GU: Negative; No dysuria, hematuria, or difficulty voiding Musculoskeletal: Positive for significant scoliosis; fibromyalgia;  joint pain, or weakness Hematologic/Oncology: Negative; no easy bruising, bleeding Endocrine: Negative; no heat/cold intolerance; no diabetes Neuro: Negative; no changes in balance, headaches Skin: Negative; No rashes or skin lesions Psychiatric: Negative; No behavioral problems, depression Sleep: Positive for sleep apnea No snoring, daytime sleepiness, hypersomnolence, bruxism, restless legs, hypnogognic  hallucinations, no cataplexy Other comprehensive 14 point system review is negative.  PE BP 148/86  Pulse 62  Ht 5' 3"  (1.6 m)  Wt 129 lb 9.6 oz (58.786 kg)  BMI 22.96 kg/m2  General: Alert, oriented, no distress.  Skin: normal turgor, no rashes HEENT: Normocephalic, atraumatic. Pupils round and reactive; sclera anicteric;no lid lag.  Nose without nasal septal hypertrophy Mouth/Parynx benign; Mallinpatti scale 3 I did not see any thrush on inspection of her oropharynx. Neck: No JVD, no carotid bruits without carotid upstroke Lungs: clear to ausculatation and percussion; no wheezing or rales Chest wall: Mild tenderness to palpation in the lateral left pectoral region/rib. She does have significant scoliosis. Heart: RRR, s1 s2 normal 6 systolic murmur; no ectopy  on prolonged auscultation Abdomen: soft, nontender; no hepatosplenomehaly, BS+; abdominal aorta nontender and not dilated by palpation. Back: No CVA tenderness. Pulses 2+ Extremities: no clubbing cyanosis or edema, Homan's sign negative  Neurologic: grossly nonfocal  ECG (independently read by me): Normal sinus rhythm at 62 beats per minute.  QTc interval 411 milliseconds.  QRS complex V1, V2.  Normal intervals.  ECG (independently read by me): Normal sinus rhythm at 62 beats per minute. Normal intervals. QTc interval 399 ms.  Prior ECG of 07/13/2013: Sinus rhythm at 61 beats per minute. QTc interval 406 ms. PR interval normal at 168 ms.  LABS:  BMET    Component Value Date/Time   NA 139 11/29/2012 1736   K 4.1 11/29/2012 1736   CL 101 11/29/2012 1736   CO2 26 11/29/2012 1736   GLUCOSE 106* 11/29/2012 1736   BUN 14 11/29/2012 1736   CREATININE 0.79 11/29/2012 2241   CALCIUM 10.2 11/29/2012 1736   GFRNONAA 78* 11/29/2012 2241   GFRAA >90 11/29/2012 2241     Hepatic Function Panel     Component Value Date/Time   PROT 7.2 05/11/2010 0436   ALBUMIN 3.3* 05/11/2010 0436   AST 18 05/11/2010 0436   ALT 18 05/11/2010 0436   ALKPHOS 66  05/11/2010 0436   BILITOT 0.6 05/11/2010 0436     CBC    Component Value Date/Time   WBC 11.4* 11/29/2012 2241   RBC 4.28 11/29/2012 2241   HGB 13.5 11/29/2012 2241   HCT 38.2 11/29/2012 2241   PLT 264 11/29/2012 2241   MCV 89.3 11/29/2012 2241   MCH 31.5 11/29/2012 2241   MCHC 35.3 11/29/2012 2241   RDW 13.7 11/29/2012 2241   LYMPHSABS 1.9 11/29/2012 1736   MONOABS 0.8 11/29/2012 1736   EOSABS 0.0 11/29/2012 1736   BASOSABS 0.0 11/29/2012 1736     BNP    Component Value Date/Time   PROBNP 178.6 11/29/2012 1736    Lipid Panel  No results found for this basename: chol,  trig,  hdl,  cholhdl,  vldl,  ldlcalc     RADIOLOGY: No results found.    ASSESSMENT AND PLAN: Ms. Oceguera is an 80  years old female with a history of documented SVT and PACs/PVCs which have been controlled in the past with beta blocker therapy. She has documented normal systolic function on her last echo in March 2014 but she did have grade 1 diastolic dysfunction. She did have significant left atrial dilatation and mild dilatation of the right ventricle with moderate dilatation of the right atrium. She did have mild/moderate pulmonary hypertension with PA pressures of 42 mm. Palpitations have improved on her current therapy of Toprol-XL 75 mg twice a day.  She was concerned about this medication, possibly causing her to lose some hair.  I reviewed the data with her regarding the incidence of hair loss associated with beta blocker therapy.  It is my recommendation that she continue to take the beta blocker therapy in light of her SVT, which has been documented on numerous occasions.  I also discussed with her the fact that she is not sleeping well and noticing his some nocturnal events that she may very well sub optimally treated sleep apnea.  She tells me that Dr. [name] had her do a home sleep study while wearing his customize dental appliance, and she was told that she still has sleep apnea, but it was improved from previously.  In  the past, she did not tolerate CPAP  therapy, and does not have any machine presently.  We discussed that if her sleep pattern continues to deteriorate despite use of oral appliances, reconsideration of treatment can occur, and, particularly a trial of some of the newer nasal pillow masks prior to reinitiating therapy to see if she will be able to tolerate these.  She returned to the primary care of Dr. Serina Cowper.  As long as she remains stable, I will see her in one year for reevaluation or sooner if she decides to reconsider another attempt at CPAP or problems arise.  Troy Sine, MD, [facility]  03/30/2014 8:29 PM

## 2014-04-12 DIAGNOSIS — H6120 Impacted cerumen, unspecified ear: Secondary | ICD-10-CM | POA: Diagnosis not present

## 2014-04-12 DIAGNOSIS — J310 Chronic rhinitis: Secondary | ICD-10-CM | POA: Diagnosis not present

## 2014-04-12 DIAGNOSIS — J342 Deviated nasal septum: Secondary | ICD-10-CM | POA: Diagnosis not present

## 2014-04-12 DIAGNOSIS — R12 Heartburn: Secondary | ICD-10-CM | POA: Diagnosis not present

## 2014-04-12 DIAGNOSIS — R0681 Apnea, not elsewhere classified: Secondary | ICD-10-CM | POA: Diagnosis not present

## 2014-04-12 DIAGNOSIS — K219 Gastro-esophageal reflux disease without esophagitis: Secondary | ICD-10-CM | POA: Diagnosis not present

## 2014-06-22 DIAGNOSIS — L821 Other seborrheic keratosis: Secondary | ICD-10-CM | POA: Diagnosis not present

## 2014-06-22 DIAGNOSIS — I781 Nevus, non-neoplastic: Secondary | ICD-10-CM | POA: Diagnosis not present

## 2014-06-22 DIAGNOSIS — L282 Other prurigo: Secondary | ICD-10-CM | POA: Diagnosis not present

## 2014-06-22 DIAGNOSIS — D485 Neoplasm of uncertain behavior of skin: Secondary | ICD-10-CM | POA: Diagnosis not present

## 2014-06-26 DIAGNOSIS — M4120 Other idiopathic scoliosis, site unspecified: Secondary | ICD-10-CM | POA: Diagnosis not present

## 2014-06-26 DIAGNOSIS — Z23 Encounter for immunization: Secondary | ICD-10-CM | POA: Diagnosis not present

## 2014-06-26 DIAGNOSIS — R109 Unspecified abdominal pain: Secondary | ICD-10-CM | POA: Diagnosis not present

## 2014-06-26 DIAGNOSIS — K219 Gastro-esophageal reflux disease without esophagitis: Secondary | ICD-10-CM | POA: Diagnosis not present

## 2014-06-26 DIAGNOSIS — R209 Unspecified disturbances of skin sensation: Secondary | ICD-10-CM | POA: Diagnosis not present

## 2014-08-16 DIAGNOSIS — H26492 Other secondary cataract, left eye: Secondary | ICD-10-CM | POA: Diagnosis not present

## 2014-08-16 DIAGNOSIS — H3531 Nonexudative age-related macular degeneration: Secondary | ICD-10-CM | POA: Diagnosis not present

## 2014-08-16 DIAGNOSIS — H26491 Other secondary cataract, right eye: Secondary | ICD-10-CM | POA: Diagnosis not present

## 2014-08-16 DIAGNOSIS — H40013 Open angle with borderline findings, low risk, bilateral: Secondary | ICD-10-CM | POA: Diagnosis not present

## 2014-08-16 DIAGNOSIS — Z961 Presence of intraocular lens: Secondary | ICD-10-CM | POA: Diagnosis not present

## 2014-09-14 DIAGNOSIS — R5381 Other malaise: Secondary | ICD-10-CM | POA: Diagnosis not present

## 2014-09-14 DIAGNOSIS — J329 Chronic sinusitis, unspecified: Secondary | ICD-10-CM | POA: Diagnosis not present

## 2014-09-26 DIAGNOSIS — Z01419 Encounter for gynecological examination (general) (routine) without abnormal findings: Secondary | ICD-10-CM | POA: Diagnosis not present

## 2014-09-26 DIAGNOSIS — Z1231 Encounter for screening mammogram for malignant neoplasm of breast: Secondary | ICD-10-CM | POA: Diagnosis not present

## 2014-10-05 DIAGNOSIS — M1712 Unilateral primary osteoarthritis, left knee: Secondary | ICD-10-CM | POA: Diagnosis not present

## 2014-10-05 DIAGNOSIS — M1711 Unilateral primary osteoarthritis, right knee: Secondary | ICD-10-CM | POA: Diagnosis not present

## 2014-10-11 ENCOUNTER — Telehealth: Payer: Self-pay | Admitting: Cardiovascular Disease

## 2014-10-11 NOTE — Telephone Encounter (Signed)
acknowledged

## 2014-10-11 NOTE — Telephone Encounter (Signed)
Spoke w/ patient. She declined to discuss any information w/ nurse. She stated only that she wanted to schedule an appt to be seen by Dr. [name]. She declined to be scheduled with mid-level. I discussed his availaibility, she took next available appt in February. Offered to place on waitlist should something come available sooner. She agreed to this, voiced understanding.

## 2014-10-11 NOTE — Telephone Encounter (Signed)
Janet Nguyen is calling because  She states that she has been having some issues with her sleep apnea. Waking up in a hot sweat feeling like she is being choked,,heart is beating rapidly . Please call  Thanks

## 2014-11-14 DIAGNOSIS — R1032 Left lower quadrant pain: Secondary | ICD-10-CM | POA: Diagnosis not present

## 2014-11-17 ENCOUNTER — Ambulatory Visit (INDEPENDENT_AMBULATORY_CARE_PROVIDER_SITE_OTHER): Payer: Medicare Other | Admitting: Cardiovascular Disease

## 2014-11-17 ENCOUNTER — Encounter: Payer: Self-pay | Admitting: Cardiovascular Disease

## 2014-11-17 VITALS — BP 134/72 | HR 56 | Ht 63.0 in | Wt 125.5 lb

## 2014-11-17 DIAGNOSIS — M419 Scoliosis, unspecified: Secondary | ICD-10-CM | POA: Diagnosis not present

## 2014-11-17 DIAGNOSIS — R002 Palpitations: Secondary | ICD-10-CM

## 2014-11-17 DIAGNOSIS — G4733 Obstructive sleep apnea (adult) (pediatric): Secondary | ICD-10-CM

## 2014-11-17 DIAGNOSIS — I4892 Unspecified atrial flutter: Secondary | ICD-10-CM

## 2014-11-17 DIAGNOSIS — G4730 Sleep apnea, unspecified: Secondary | ICD-10-CM | POA: Diagnosis not present

## 2014-11-17 DIAGNOSIS — I471 Supraventricular tachycardia: Secondary | ICD-10-CM | POA: Diagnosis not present

## 2014-11-17 NOTE — Patient Instructions (Signed)
You have been referred to [facility] for a over night oxygen test . They will contact you to set this up.  Your physician wants you to follow-up in: 6 months or sooner if needed with Dr. [name]. You will receive a reminder letter in the mail two months in advance. If you don't receive a letter, please call our office to schedule the follow-up appointment.

## 2014-11-19 ENCOUNTER — Encounter: Payer: Self-pay | Admitting: Cardiovascular Disease

## 2014-11-19 NOTE — Progress Notes (Signed)
Patient ID: Janet Nguyen, female   DOB: [birth year], 81 y.o.   MRN: [id number]     HPI: Janet Nguyen is a 81 y.o. female  who presents for an 8 month followup cardiology evaluation.   Janet Nguyen has a history of documented SVT and also has PACs and PVCs which have been treated with beta blocker therapy. n April 2014 I further titrated her beta blocker therapy after cardiac event monitor did show several bursts of recurrent SVT up to 177 beats per minute in March.  In July after she had had 2 episodes of chest fluttering which each lasted over 30 minutes which he did take metoprolol tartrate with relief I recommended further titration of her Toprol to 75 mg in the morning and 50 mg at night and if necessary she could further titrate this to 75 twice a day.  She has a history of obstructive sleep apnea but despite multiple attempts at CPAP utilization she has not been able to tolerate this. She was referred to Dr. [name] and has a customized dental appliance with mandibular advancement with improvement in some of her symptomatology. Due concern that her teeth may be moving in more recently she has has not been using customized appliance daily but has been using her old non-customized mouthguard.    She presents today stating that he has at times shen has awakened abruptly from a dream with her heart pounding and a sensation of hot flashes, gasping for breath.   She also states that her scoliosis is getting worse. She does have left-sided musculoskeletal type chest pain due to her spine angulation.  Beause of her significant scoliosis, she feels she must sleep on her back.   She also has a history of GERD for which she has been taking Nexium.  She presents for evaluation.     Past Medical History  Diagnosis Date  . Irregular heart beat 11/30/12    ECHO-EF 60-65%  . Celiac disease     treated by Dr. [name]  . GERD (gastroesophageal reflux disease)   . Osteoporosis   . Scoliosis   .  Intervertebral disc stenosis of neural canal of cervical region   . Sleep apnea 10/02/11  [facility]    Sleep study AHI -total sleep 10.3/hr  64.0/ hr during REM sleep.RDI 22.8/hr during total sleep 64.0/hr during REM sleep The lowest O2 sat during Non-REM and REM sleep was 86% and 88% respectively. 04/08/12 CPAP/BIPAP titration study [facility]  . Scoliosis   . Arrhythmia     History of SVT with documented PVC'S and  PAC'S  12/08/12 Nuc stress test normal LV EF 74%  Event Monitor  12/01/12-01/03/13    Past Surgical History  Procedure Laterality Date  . Appendectomy      ruptured at age 39 and had surgery  . Cardiac catheterization  01/27/06    Allergies  Allergen Reactions  . Proton Pump Inhibitors     allopecia  . Codeine Nausea Only  . Gluten Meal     Unknown    Current Outpatient Prescriptions  Medication Sig Dispense Refill  . acetaminophen (TYLENOL) 500 MG tablet Take 500 mg by mouth as needed for pain.    Marland Kitchen ALPRAZolam (XANAX) 0.25 MG tablet Take 0.25 mg by mouth as needed.     Marland Kitchen aspirin 81 MG tablet Take 81 mg by mouth daily.    . calcium-vitamin D (OSCAL) 250-125 MG-UNIT per tablet Take 1 tablet by  mouth daily.    . clidinium-chlordiazePOXIDE (LIBRAX) 5-2.5 MG per capsule     . esomeprazole (NEXIUM) 20 MG capsule Take 20 mg by mouth every other day.     . metoprolol succinate (TOPROL-XL) 50 MG 24 hr tablet Take 1 & 1/2 tablet twice daily 270 tablet 3  . metoprolol tartrate (LOPRESSOR) 25 MG tablet Take 25 mg by mouth as needed.    . Multiple Vitamin (MULTIVITAMIN WITH MINERALS) TABS Take 1 tablet by mouth daily.    . Wheat Dextrin (EQ FIBER POWDER) POWD Take by mouth.    . zolpidem (AMBIEN) 5 MG tablet Take 1 tablet (5 mg total) by mouth at bedtime as needed for sleep. 90 tablet 0   No current facility-administered medications for this visit.    Socially she is divorced has 4 children 9 grandchildren. She does exercise. No tobacco use.  She does occasional wine.  ROS General: Negative; No fevers, chills, or night sweats;  HEENT: Negative; No changes in vision or hearing, sinus congestion, difficulty swallowing Pulmonary: Negative; No cough, wheezing, shortness of breath, hemoptysis Cardiovascular: Positive for occasional chest wall pain and nocturnal palpitations GI: Negative; No nausea, vomiting, diarrhea, or abdominal pain GU: Negative; No dysuria, hematuria, or difficulty voiding Musculoskeletal: Positive for significant scoliosis; fibromyalgia;  joint pain, or weakness Hematologic/Oncology: Negative; no easy bruising, bleeding Endocrine: Negative; no heat/cold intolerance; no diabetes Neuro: Negative; no changes in balance, headaches Skin: Negative; No rashes or skin lesions Psychiatric: Negative; No behavioral problems, depression Sleep: Positive for sleep apnea ; No snoring, daytime sleepiness, hypersomnolence, bruxism, restless legs, hypnogognic hallucinations, no cataplexy Other comprehensive 14 point system review is negative.  PE BP 134/72 mmHg  Pulse 56  Ht 5' 3"  (1.6 m)  Wt 125 lb 8 oz (56.926 kg)  BMI 22.24 kg/m2  General: Alert, oriented, no distress.  Skin: normal turgor, no rashes HEENT: Normocephalic, atraumatic. Pupils round and reactive; sclera anicteric;no lid lag.  Nose without nasal septal hypertrophy Mouth/Parynx benign; Mallinpatti scale 3  Neck: No JVD, no carotid bruits without carotid upstroke Lungs: clear to ausculatation and percussion; no wheezing or rales Chest wall: Mild tenderness to palpation in the lateral left pectoral region/rib. She does have significant scoliosis. Heart: RRR, s1 s2 normal 6 systolic murmur; no ectopy on prolonged auscultation Abdomen: soft, nontender; no hepatosplenomehaly, BS+; abdominal aorta nontender and not dilated by palpation. Back: No CVA tenderness. Pulses 2+ Extremities: no clubbing cyanosis or edema, Homan's sign negative  Neurologic: grossly  nonfocal Psychological: Normal affect and mood  ECG (independently read by me): Sinus bradycardia 56 bpm.  Normal intervals.  ECG (independently read by me): Normal sinus rhythm at 62 beats per minute.  QTc interval 411 milliseconds.  QRS complex V1, V2.  Normal intervals.  ECG (independently read by me): Normal sinus rhythm at 62 beats per minute. Normal intervals. QTc interval 399 ms.  Prior ECG of 07/13/2013: Sinus rhythm at 61 beats per minute. QTc interval 406 ms. PR interval normal at 168 ms.  LABS:  BMET    Component Value Date/Time   NA 139 11/29/2012 1736   K 4.1 11/29/2012 1736   CL 101 11/29/2012 1736   CO2 26 11/29/2012 1736   GLUCOSE 106* 11/29/2012 1736   BUN 14 11/29/2012 1736   CREATININE 0.79 11/29/2012 2241   CALCIUM 10.2 11/29/2012 1736   GFRNONAA 78* 11/29/2012 2241   GFRAA >90 11/29/2012 2241     Hepatic Function Panel     Component Value Date/Time  PROT 7.2 05/11/2010 0436   ALBUMIN 3.3* 05/11/2010 0436   AST 18 05/11/2010 0436   ALT 18 05/11/2010 0436   ALKPHOS 66 05/11/2010 0436   BILITOT 0.6 05/11/2010 0436     CBC    Component Value Date/Time   WBC 11.4* 11/29/2012 2241   RBC 4.28 11/29/2012 2241   HGB 13.5 11/29/2012 2241   HCT 38.2 11/29/2012 2241   PLT 264 11/29/2012 2241   MCV 89.3 11/29/2012 2241   MCH 31.5 11/29/2012 2241   MCHC 35.3 11/29/2012 2241   RDW 13.7 11/29/2012 2241   LYMPHSABS 1.9 11/29/2012 1736   MONOABS 0.8 11/29/2012 1736   EOSABS 0.0 11/29/2012 1736   BASOSABS 0.0 11/29/2012 1736     BNP    Component Value Date/Time   PROBNP 178.6 11/29/2012 1736    Lipid Panel  No results found for: CHOL   RADIOLOGY: No results found.    ASSESSMENT AND PLAN: Janet Nguyen is an 81  years old female with a history of documented SVT and PACs/PVCs which have been controlled in the past with beta blocker therapy. She has documented normal systolic function on her last echo in March 2014 with grade 1 diastolic  dysfunction. She did have significant left atrial dilatation and mild dilatation of the right ventricle with moderate dilatation of the right atrium. She did have mild/moderate pulmonary hypertension with PA pressures of 42 mm. Her daily palpitations have improved on her current therapy of Toprol-XL 75 mg twice a day.  I suspect that her most recent episodes of waking up in the middle of the night during a dream with significant palpitations and gasping for breath.  Representative of a significant sleep apneic episode occurring during REM sleep in this patient who has previously documented moderate sleep apnea.  She sleeps on her back which may potentiate her sleep apnea.  She is bothered by her scoliosis which limits her lateral position.  In the past, she had a customized appliance by Dr. Ron Parker but due to concerns of teeth movement .  She has stopped using this.  We have tried CPAP therapy on several occasions but she has remained intolerant to any attempted CPAP treatment.  I have recommended that she return to Dr. Oneal Grout is possible that his customized dental appliance may be adjusted so as not to potentially cause teeth shifting with its mandibular advancement.  I've also recommended that we obtain an overnight oximetry to make certain she is not significantly desaturating her oxygen.  If she cannot use CPAP therapy.  She may benefit from nocturnal oxygen, particularly if desaturation is present.  I again discussed with her improved technology with CPAP masks, nasal pillows, and even the dream nasal mask which provides CPAP therapy externally to the nose without nasal pillows in the nares.  I will contact her after she completes her overnight oximetry study.  She will continue using Nexium.  We discussed the potential to try to sleep on her side if at all possible.  She will continue the current dose of Toprol-XL at 75 Mill grams twice a day.  On this therapy her ECG reveals sinus bradycardia at 56 bpm. .  I  will see her in 6 months for reevaluation.  Troy Sine, MD, [facility]  11/19/2014 9:50 AM

## 2014-11-21 ENCOUNTER — Telehealth: Payer: Self-pay | Admitting: Cardiovascular Disease

## 2014-11-21 DIAGNOSIS — G4733 Obstructive sleep apnea (adult) (pediatric): Secondary | ICD-10-CM | POA: Diagnosis not present

## 2014-11-21 NOTE — Telephone Encounter (Signed)
Pt called in wanting to speak with Mariann Laster about her Metoprolol medication. She wanted to know if any changes needed to be made at this time. Please f/u  Thanks

## 2014-11-22 NOTE — Telephone Encounter (Signed)
Pt is calling again,she says she just want to talk to a nurse asap.

## 2014-11-22 NOTE — Telephone Encounter (Signed)
Advised to keep taking metoprolol as prescribed. Pt voiced understanding.

## 2014-11-23 ENCOUNTER — Encounter: Payer: Self-pay | Admitting: Cardiovascular Disease

## 2014-11-28 ENCOUNTER — Telehealth: Payer: Self-pay | Admitting: *Deleted

## 2014-11-28 NOTE — Telephone Encounter (Signed)
-----   Message from Troy Sine, MD sent at 11/27/2014 11:43 AM EST ----- 10 events totaling 19 min with O2 sat < 88; add 2 liter n/c supplemental oxygen

## 2014-11-30 ENCOUNTER — Telehealth: Payer: Self-pay | Admitting: Cardiovascular Disease

## 2014-11-30 ENCOUNTER — Other Ambulatory Visit: Payer: Self-pay | Admitting: *Deleted

## 2014-11-30 DIAGNOSIS — G4734 Idiopathic sleep related nonobstructive alveolar hypoventilation: Secondary | ICD-10-CM

## 2014-11-30 NOTE — Progress Notes (Signed)
Patient notified of results and recommendations.

## 2014-11-30 NOTE — Telephone Encounter (Signed)
Returning your call . Please call

## 2014-12-04 ENCOUNTER — Emergency Department (HOSPITAL_COMMUNITY): Payer: Medicare Other

## 2014-12-04 ENCOUNTER — Emergency Department (HOSPITAL_COMMUNITY)
Admission: EM | Admit: 2014-12-04 | Discharge: 2014-12-04 | Disposition: A | Discharge status: Home / Self Care | Payer: Medicare Other | Attending: Emergency Medicine | Admitting: Emergency Medicine

## 2014-12-04 ENCOUNTER — Encounter (HOSPITAL_COMMUNITY): Payer: Self-pay | Admitting: Emergency Medicine

## 2014-12-04 DIAGNOSIS — M791 Myalgia: Secondary | ICD-10-CM | POA: Diagnosis not present

## 2014-12-04 DIAGNOSIS — G4489 Other headache syndrome: Secondary | ICD-10-CM | POA: Diagnosis not present

## 2014-12-04 DIAGNOSIS — Z8679 Personal history of other diseases of the circulatory system: Secondary | ICD-10-CM | POA: Insufficient documentation

## 2014-12-04 DIAGNOSIS — M810 Age-related osteoporosis without current pathological fracture: Secondary | ICD-10-CM | POA: Insufficient documentation

## 2014-12-04 DIAGNOSIS — Z7982 Long term (current) use of aspirin: Secondary | ICD-10-CM | POA: Insufficient documentation

## 2014-12-04 DIAGNOSIS — R42 Dizziness and giddiness: Secondary | ICD-10-CM | POA: Diagnosis not present

## 2014-12-04 DIAGNOSIS — R0902 Hypoxemia: Secondary | ICD-10-CM | POA: Diagnosis not present

## 2014-12-04 DIAGNOSIS — Z79899 Other long term (current) drug therapy: Secondary | ICD-10-CM | POA: Diagnosis not present

## 2014-12-04 DIAGNOSIS — R51 Headache: Secondary | ICD-10-CM | POA: Diagnosis not present

## 2014-12-04 DIAGNOSIS — K219 Gastro-esophageal reflux disease without esophagitis: Secondary | ICD-10-CM | POA: Diagnosis not present

## 2014-12-04 DIAGNOSIS — R404 Transient alteration of awareness: Secondary | ICD-10-CM | POA: Diagnosis not present

## 2014-12-04 LAB — CBC WITH DIFFERENTIAL/PLATELET
Basophils Absolute: 0.1 10*3/uL (ref 0.0–0.1)
Basophils Relative: 1 % (ref 0–1)
Eosinophils Absolute: 0.1 10*3/uL (ref 0.0–0.7)
Eosinophils Relative: 1 % (ref 0–5)
HCT: 41.1 % (ref 36.0–46.0)
Hemoglobin: 14 g/dL (ref 12.0–15.0)
Lymphocytes Relative: 31 % (ref 12–46)
Lymphs Abs: 3 10*3/uL (ref 0.7–4.0)
MCH: 30.8 pg (ref 26.0–34.0)
MCHC: 34.1 g/dL (ref 30.0–36.0)
MCV: 90.3 fL (ref 78.0–100.0)
Monocytes Absolute: 1.1 10*3/uL — ABNORMAL HIGH (ref 0.1–1.0)
Monocytes Relative: 11 % (ref 3–12)
Neutro Abs: 5.6 10*3/uL (ref 1.7–7.7)
Neutrophils Relative %: 56 % (ref 43–77)
Platelets: 323 10*3/uL (ref 150–400)
RBC: 4.55 MIL/uL (ref 3.87–5.11)
RDW: 13.4 % (ref 11.5–15.5)
WBC: 9.9 10*3/uL (ref 4.0–10.5)

## 2014-12-04 LAB — COMPREHENSIVE METABOLIC PANEL
ALT: 24 U/L (ref 0–35)
AST: 37 U/L (ref 0–37)
Albumin: 4.5 g/dL (ref 3.5–5.2)
Alkaline Phosphatase: 53 U/L (ref 39–117)
Anion gap: 7 (ref 5–15)
BUN: 12 mg/dL (ref 6–23)
CO2: 29 mmol/L (ref 19–32)
Calcium: 9.9 mg/dL (ref 8.4–10.5)
Chloride: 102 mmol/L (ref 96–112)
Creatinine, Ser: 0.86 mg/dL (ref 0.50–1.10)
GFR calc Af Amer: 72 mL/min — ABNORMAL LOW (ref >90)
GFR calc non Af Amer: 62 mL/min — ABNORMAL LOW (ref >90)
Glucose, Bld: 106 mg/dL — ABNORMAL HIGH (ref 70–99)
Potassium: 4 mmol/L (ref 3.5–5.1)
Sodium: 138 mmol/L (ref 135–145)
Total Bilirubin: 0.7 mg/dL (ref 0.3–1.2)
Total Protein: 7.4 g/dL (ref 6.0–8.3)

## 2014-12-04 LAB — I-STAT TROPONIN, ED: Troponin i, poc: 0 ng/mL (ref 0.00–0.08)

## 2014-12-04 MED ORDER — METOCLOPRAMIDE HCL 5 MG/ML IJ SOLN
10.0000 mg | Freq: Once | INTRAMUSCULAR | Status: AC
  Administered 2014-12-04: 10 mg via INTRAVENOUS
  Filled 2014-12-04: qty 2

## 2014-12-04 MED ORDER — SODIUM CHLORIDE 0.9 % IV BOLUS (SEPSIS)
1000.0000 mL | Freq: Once | INTRAVENOUS | Status: AC
  Administered 2014-12-04: 1000 mL via INTRAVENOUS

## 2014-12-04 MED ORDER — DIPHENHYDRAMINE HCL 50 MG/ML IJ SOLN
25.0000 mg | Freq: Once | INTRAMUSCULAR | Status: AC
  Administered 2014-12-04: 25 mg via INTRAVENOUS
  Filled 2014-12-04: qty 1

## 2014-12-04 NOTE — ED Notes (Signed)
Pt states her HA started at 11:10

## 2014-12-04 NOTE — Discharge Instructions (Signed)
Take tylenol, motrin for headaches.   Follow up with your doctor.   Return to ER if you have worse headaches, neck pain, vomiting, fever.

## 2014-12-04 NOTE — ED Provider Notes (Signed)
CSN: [id number]     Arrival date & time 12/04/14  1239 History   First MD Initiated Contact with Patient 12/04/14 1242     Chief Complaint  Patient presents with  . Headache  . Dizziness     (Consider location/radiation/quality/duration/timing/severity/associated sxs/prior Treatment) The history is provided by the patient.  Janet Nguyen is a 81 y.o. female history of GERD, sleep apnea here presenting with headache, hypertension. She went to see her doctor for her sleep apnea. While in the doctor's office, around 11:10 am, she has sudden onset severe headache. She never had headaches like this before. She has chronic posterior headache with her cervical radiculopathy but this is a different location. Associated with some photophobia. Denies any fevers or chills. She saw her doctor was sent here for rule out subarachnoid.    Past Medical History  Diagnosis Date  . Irregular heart beat 11/30/12    ECHO-EF 60-65%  . Celiac disease     treated by Dr. [name]  . GERD (gastroesophageal reflux disease)   . Osteoporosis   . Scoliosis   . Intervertebral disc stenosis of neural canal of cervical region   . Sleep apnea 10/02/11 [facility]    Sleep study AHI -total sleep 10.3/hr  64.0/ hr during REM sleep.RDI 22.8/hr during total sleep 64.0/hr during REM sleep The lowest O2 sat during Non-REM and REM sleep was 86% and 88% respectively. 04/08/12 CPAP/BIPAP titration study [facility]  . Scoliosis   . Arrhythmia     History of SVT with documented PVC'S and  PAC'S  12/08/12 Nuc stress test normal LV EF 74%  Event Monitor  12/01/12-01/03/13   Past Surgical History  Procedure Laterality Date  . Appendectomy      ruptured at age 48 and had surgery  . Cardiac catheterization  01/27/06   History reviewed. No pertinent family history. History  Substance Use Topics  . Smoking status: Never Smoker   . Smokeless tobacco: Never Used  . Alcohol Use: 0.5 oz/week     1 drink(s) per week   OB History    No data available     Review of Systems  Neurological: Positive for dizziness and headaches.  All other systems reviewed and are negative.     Allergies  Codeine and Gluten meal  Home Medications   Prior to Admission medications   Medication Sig Start Date End Date Taking? Authorizing Provider  acetaminophen (TYLENOL) 500 MG tablet Take 500 mg by mouth as needed for pain.   Yes Historical Provider, MD  ALPRAZolam Duanne Moron) 0.25 MG tablet Take 0.25 mg by mouth as needed for anxiety.  02/05/13  Yes Historical Provider, MD  Ascorbic Acid (VITAMIN C PO) Take 1 tablet by mouth 2 (two) times daily.   Yes Historical Provider, MD  aspirin 81 MG tablet Take 81 mg by mouth every other day.    Yes Historical Provider, MD  BIOTIN PO Take 1 tablet by mouth 2 (two) times daily.   Yes Historical Provider, MD  calcium-vitamin D (OSCAL) 250-125 MG-UNIT per tablet Take 1 tablet by mouth daily.   Yes Historical Provider, MD  clidinium-chlordiazePOXIDE (LIBRAX) 5-2.5 MG per capsule Take 1 capsule by mouth daily as needed (upset stomach).  11/15/14  Yes Historical Provider, MD  esomeprazole (NEXIUM) 20 MG capsule Take 20 mg by mouth every other day.    Yes Historical Provider, MD  metoprolol succinate (TOPROL-XL) 50 MG 24 hr tablet Take 1 & 1/2  tablet twice daily Patient taking differently: Take 75 mg by mouth 2 (two) times daily. Take 1 & 1/2 tablet twice daily 01/20/14  Yes Troy Sine, MD  Multiple Vitamin (MULTIVITAMIN WITH MINERALS) TABS Take 1 tablet by mouth daily.   Yes Historical Provider, MD  Wheat Dextrin (EQ FIBER POWDER) POWD Take by mouth.   Yes Historical Provider, MD  zolpidem (AMBIEN) 5 MG tablet Take 1 tablet (5 mg total) by mouth at bedtime as needed for sleep.   Yes Troy Sine, MD   BP 147/62 mmHg  Temp(Src) 98.2 F (36.8 C) (Oral)  Resp 13  Ht 5' 3"  (1.6 m)  Wt 124 lb (56.246 kg)  BMI 21.97 kg/m2  SpO2 99% Physical Exam  Constitutional:  She is oriented to person, place, and time.  Uncomfortable   HENT:  Head: Normocephalic.  Mouth/Throat: Oropharynx is clear and moist.  Eyes: Conjunctivae are normal. Pupils are equal, round, and reactive to light.  Neck: Normal range of motion. Neck supple.  Cardiovascular: Normal rate, regular rhythm and normal heart sounds.   Pulmonary/Chest: Effort normal and breath sounds normal. No respiratory distress. She has no wheezes. She has no rales.  Abdominal: Soft. Bowel sounds are normal. She exhibits no distension. There is no tenderness. There is no rebound.  Musculoskeletal: Normal range of motion. She exhibits no edema or tenderness.  Neurological: She is alert and oriented to person, place, and time. No cranial nerve deficit. Coordination normal.  CN 2-12 intact. Nl strength throughout. Nl finger to nose. No pronator drift   Skin: Skin is warm and dry.  Psychiatric: She has a normal mood and affect. Her behavior is normal. Judgment and thought content normal.  Nursing note and vitals reviewed.   ED Course  Procedures (including critical care time) Labs Review Labs Reviewed  CBC WITH DIFFERENTIAL/PLATELET - Abnormal; Notable for the following:    Monocytes Absolute 1.1 (*)    All other components within normal limits  COMPREHENSIVE METABOLIC PANEL - Abnormal; Notable for the following:    Glucose, Bld 106 (*)    GFR calc non Af Amer 62 (*)    GFR calc Af Amer 72 (*)    All other components within normal limits  I-STAT TROPOININ, ED    Imaging Review Ct Head Wo Contrast  12/04/2014   CLINICAL DATA:  sudden onset HA and dizziness. Pt denies N/V. No neuro deficits per EMS. Pt denies dizziness at this point, only HA still present.  EXAM: CT HEAD WITHOUT CONTRAST  CT CERVICAL SPINE WITHOUT CONTRAST  TECHNIQUE: Multidetector CT imaging of the head and cervical spine was performed following the standard protocol without intravenous contrast. Multiplanar CT image reconstructions of the  cervical spine were also generated.  COMPARISON:  cervical spine 05/10/2010 CT head same day.  FINDINGS: CT HEAD FINDINGS  No acute intracranial hemorrhage. No focal mass lesion. No CT evidence of acute infarction. No midline shift or mass effect. No hydrocephalus. Basilar cisterns are patent.  Mild cortical atrophy. Mild periventricular and white matter hypodensity.  CT CERVICAL SPINE FINDINGS  No prevertebral soft tissue swelling. Normal alignment of cervical vertebral bodies. No loss of vertebral body height. Normal facet articulation. Normal craniocervical junction.  Do endplate spurring at Z8-H8.  No evidence epidural or paraspinal hematoma.  IMPRESSION: 1. No acute intracranial findings. 2. Mild atrophy and microvascular disease. 3. No cervical spine fracture. 4. Mild disc osteophytic disease.   Electronically Signed   By: Helane Gunther.D.  On: 12/04/2014 14:59   Ct Cervical Spine Wo Contrast  12/04/2014   CLINICAL DATA:  sudden onset HA and dizziness. Pt denies N/V. No neuro deficits per EMS. Pt denies dizziness at this point, only HA still present.  EXAM: CT HEAD WITHOUT CONTRAST  CT CERVICAL SPINE WITHOUT CONTRAST  TECHNIQUE: Multidetector CT imaging of the head and cervical spine was performed following the standard protocol without intravenous contrast. Multiplanar CT image reconstructions of the cervical spine were also generated.  COMPARISON:  cervical spine 05/10/2010 CT head same day.  FINDINGS: CT HEAD FINDINGS  No acute intracranial hemorrhage. No focal mass lesion. No CT evidence of acute infarction. No midline shift or mass effect. No hydrocephalus. Basilar cisterns are patent.  Mild cortical atrophy. Mild periventricular and white matter hypodensity.  CT CERVICAL SPINE FINDINGS  No prevertebral soft tissue swelling. Normal alignment of cervical vertebral bodies. No loss of vertebral body height. Normal facet articulation. Normal craniocervical junction.  Do endplate spurring at T0-W4.  No  evidence epidural or paraspinal hematoma.  IMPRESSION: 1. No acute intracranial findings. 2. Mild atrophy and microvascular disease. 3. No cervical spine fracture. 4. Mild disc osteophytic disease.   Electronically Signed   By: Suzy Bouchard M.D.   On: 12/04/2014 14:59     EKG Interpretation   Date/Time:  Monday December 04 2014 12:55:12 EST Ventricular Rate:  64 PR Interval:  159 QRS Duration: 95 QT Interval:  393 QTC Calculation: 405 R Axis:   -20 Text Interpretation:  Sinus rhythm Consider left atrial enlargement  Borderline left axis deviation No significant change since last tracing  Confirmed by Abdulla Pooley  MD, Keyondra Lagrand ([id number]) on 12/04/2014 1:02:58 PM      MDM   Final diagnoses:  None    Janet Nguyen is a 81 y.o. female here with headache. Hypertensive in the ED. Consider subarachnoid vs symptomatic hypertension. She is within 4 hr window so if CT head neg don't need LP. Will give migraine cocktail.   3:15 PM CT unremarkable. Headache improved with migraine cocktail. BP improved. Will dc home.     Wandra Arthurs, MD 12/04/14 [phone]

## 2014-12-04 NOTE — ED Notes (Signed)
Pt coming from MD office for sleep apnea appointment. While in waiting area, pt had sudden onset HA and dizziness. Pt denies N/V. No neuro deficits per EMS. Pt denies dizziness at this point, only HA still present. EKG per EMS is unremarkable. BP 184/90, HR 68,99% on room air.

## 2014-12-05 DIAGNOSIS — R51 Headache: Secondary | ICD-10-CM | POA: Diagnosis not present

## 2014-12-05 DIAGNOSIS — R0902 Hypoxemia: Secondary | ICD-10-CM | POA: Diagnosis not present

## 2014-12-05 DIAGNOSIS — M791 Myalgia: Secondary | ICD-10-CM | POA: Diagnosis not present

## 2014-12-06 DIAGNOSIS — H40013 Open angle with borderline findings, low risk, bilateral: Secondary | ICD-10-CM | POA: Diagnosis not present

## 2014-12-14 ENCOUNTER — Telehealth: Payer: Self-pay | Admitting: Cardiovascular Disease

## 2014-12-14 NOTE — Telephone Encounter (Signed)
Spoke w/ patient, clarification on recent study: pt did have O2 study done. Pt recommended to go on 2L [place] supplemental oxygen per Dr. [name]. Will reach out to [facility] to see what needs to be done to get pt on home oxygen.

## 2014-12-14 NOTE — Telephone Encounter (Signed)
Discussed w/ Curt Bears, suggested to contact [facility] to see what could be done to set up the O2 study.

## 2014-12-14 NOTE — Telephone Encounter (Signed)
Got word from [place], apparently [place] following this. Will route.

## 2014-12-14 NOTE — Telephone Encounter (Signed)
Looking at chart, last OV note Dr. [name] states wanted overnight O2 level to see if desats occuring during sleep. This was ordered day of visit. Unsure who this goes to and how this is scheduled.

## 2014-12-14 NOTE — Telephone Encounter (Signed)
Pt said the last time she saw Dr [name] he said she need to be on oxygen . She have not heard anything and that have been a month.

## 2014-12-20 NOTE — Telephone Encounter (Signed)
Pt says she still have not heard anything about her oxygen. She said she talked to you on Friday.

## 2014-12-26 DIAGNOSIS — M545 Low back pain: Secondary | ICD-10-CM | POA: Diagnosis not present

## 2014-12-26 DIAGNOSIS — M542 Cervicalgia: Secondary | ICD-10-CM | POA: Diagnosis not present

## 2014-12-26 DIAGNOSIS — M5136 Other intervertebral disc degeneration, lumbar region: Secondary | ICD-10-CM | POA: Diagnosis not present

## 2014-12-26 DIAGNOSIS — M5092 Cervical disc disorder, unspecified, mid-cervical region: Secondary | ICD-10-CM | POA: Diagnosis not present

## 2014-12-26 NOTE — Telephone Encounter (Signed)
Patient was across the hall today and walked into our office. I explained to her that I am working on trying to get her oxygen covered. The diagnosis that has been provided by Dr. [name] does not meet the medicare requirements for her to receive oxygen. Informed patient Janet Nguyen @ [facility] says that she will contact the patient to offer a out of pocket plan. Patient states to me that she doubts she will pay out of pocket for this service.

## 2015-01-02 ENCOUNTER — Other Ambulatory Visit: Payer: Self-pay | Admitting: Cardiovascular Disease

## 2015-01-02 NOTE — Telephone Encounter (Signed)
Rx(s) sent to pharmacy electronically.  

## 2015-01-10 ENCOUNTER — Ambulatory Visit (INDEPENDENT_AMBULATORY_CARE_PROVIDER_SITE_OTHER): Payer: Medicare Other | Admitting: Diagnostic Neuroimaging

## 2015-01-10 ENCOUNTER — Encounter: Payer: Self-pay | Admitting: Diagnostic Neuroimaging

## 2015-01-10 VITALS — BP 149/77 | HR 61 | Ht 63.0 in | Wt 124.4 lb

## 2015-01-10 DIAGNOSIS — G4486 Cervicogenic headache: Secondary | ICD-10-CM

## 2015-01-10 DIAGNOSIS — R51 Headache: Secondary | ICD-10-CM

## 2015-01-10 NOTE — Progress Notes (Signed)
[facility]  PATIENT: Janet Nguyen DOB: [birth year]  REFERRING CLINICIAN: A Ross  HISTORY FROM: patient  REASON FOR VISIT: new consult   HISTORICAL  CHIEF COMPLAINT:  Chief Complaint  Patient presents with  . New Evaluation    headache     HISTORY OF PRESENT ILLNESS:   81 year old right-handed female here for evaluation of headache, neck pain, back pain, dizziness. Patient has history of celiac disease, scoliosis, polymyalgia rheumatica, mild sleep apnea not able to tolerate CPAP. Patient reports head and neck pain developing in 2011, tried Celebrex and narcotics without relief. She tried physical therapy in 2015. Patient continues to have neck, shoulder, head pain. When she was at PCP office in March 2016 she had a sudden pop, burst sensation in her head, was sent to the emergency room for CT scan of the head and neck which were unremarkable for acute findings. Patient referred to me for follow-up consultation.  Since that time no recurrence of similar popping severe symptoms. She takes Tylenol as needed for pain. She is following up with Dr. Nelva Bush in pain management.   REVIEW OF SYSTEMS: Full 14 system review of systems performed and notable only for fatigue palpitation ringing in ears joint pain joint swelling aching muscles cramps dizziness headache memory loss apnea.   ALLERGIES: Allergies  Allergen Reactions  . Codeine Nausea Only  . Gluten Meal     Unknown    HOME MEDICATIONS: Outpatient Prescriptions Prior to Visit  Medication Sig Dispense Refill  . acetaminophen (TYLENOL) 500 MG tablet Take 500 mg by mouth as needed for pain.    Marland Kitchen ALPRAZolam (XANAX) 0.25 MG tablet Take 0.25 mg by mouth as needed for anxiety.     Marland Kitchen aspirin 81 MG tablet Take 81 mg by mouth every other day.     Marland Kitchen BIOTIN PO Take 1 tablet by mouth 2 (two) times daily.    . calcium-vitamin D (OSCAL) 250-125 MG-UNIT per tablet Take 1 tablet by mouth daily.    .  clidinium-chlordiazePOXIDE (LIBRAX) 5-2.5 MG per capsule Take 1 capsule by mouth daily as needed (upset stomach).     . esomeprazole (NEXIUM) 20 MG capsule Take 20 mg by mouth every other day.     . metoprolol succinate (TOPROL-XL) 50 MG 24 hr tablet Take 1 and 1/2 tablet (13m) by mouth twice daily. 270 tablet 3  . Multiple Vitamin (MULTIVITAMIN WITH MINERALS) TABS Take 1 tablet by mouth daily.    . Wheat Dextrin (EQ FIBER POWDER) POWD Take by mouth.    . zolpidem (AMBIEN) 5 MG tablet Take 1 tablet (5 mg total) by mouth at bedtime as needed for sleep. 90 tablet 0  . Ascorbic Acid (VITAMIN C PO) Take 1 tablet by mouth 2 (two) times daily.     No facility-administered medications prior to visit.    PAST MEDICAL HISTORY: Past Medical History  Diagnosis Date  . Irregular heart beat 11/30/12    ECHO-EF 60-65%  . Celiac disease     treated by Dr. MEarlean Shawl . GERD (gastroesophageal reflux disease)   . Osteoporosis   . Scoliosis   . Intervertebral disc stenosis of neural canal of cervical region   . Sleep apnea 10/02/11 [facility]    Sleep study AHI -total sleep 10.3/hr  64.0/ hr during REM sleep.RDI 22.8/hr during total sleep 64.0/hr during REM sleep The lowest O2 sat during Non-REM and REM sleep was 86% and 88% respectively. 04/08/12 [facility]  . Scoliosis   . Arrhythmia     History of SVT with documented PVC'S and  PAC'S  12/08/12 Nuc stress test normal LV EF 74%  Event Monitor  12/01/12-01/03/13    PAST SURGICAL HISTORY: Past Surgical History  Procedure Laterality Date  . Appendectomy      ruptured at age 61 and had surgery  . Cardiac catheterization  01/27/06    FAMILY HISTORY: Family History  Problem Relation Age of Onset  . Heart disease Father   . Breast cancer Mother     SOCIAL HISTORY:  History   Social History  . Marital Status: Divorced    Spouse Name: N/A  . Number of Children: 4  . Years of  Education: college    Occupational History  . retired     Social History Main Topics  . Smoking status: Never Smoker   . Smokeless tobacco: Never Used  . Alcohol Use: 0.6 oz/week    1 Standard drinks or equivalent per week     Comment: drink ETOH socially  . Drug Use: No  . Sexual Activity: Not on file   Other Topics Concern  . Not on file   Social History Narrative   Lives alone at home   Drinks tea but tries to drink decaf   Has 9 grandchildren      PHYSICAL EXAM  Filed Vitals:   01/10/15 1058  BP: 149/77  Pulse: 61  Height: 5' 3"  (1.6 m)  Weight: 124 lb 6.4 oz (56.427 kg)    Body mass index is 22.04 kg/(m^2).   Visual Acuity Screening   Right eye Left eye Both eyes  Without correction: 20/30 20/40   With correction:       No flowsheet data found.  GENERAL EXAM: Patient is in no distress; well developed, nourished and groomed; neck is supple; S-TYPE SCOLIOSIS IN SPINE  CARDIOVASCULAR: Regular rate and rhythm, no murmurs, no carotid bruits  NEUROLOGIC: MENTAL STATUS: awake, alert, oriented to person, place and time, recent and remote memory intact, normal attention and concentration, language fluent, comprehension intact, naming intact, fund of knowledge appropriate CRANIAL NERVE: no papilledema on fundoscopic exam, pupils equal and reactive to light, visual fields full to confrontation, extraocular muscles intact, no nystagmus, facial sensation and strength symmetric, hearing intact, palate elevates symmetrically, uvula midline, shoulder shrug symmetric, tongue midline. MOTOR: normal bulk and tone, full strength in the BUE, BLE SENSORY: normal and symmetric to light touch, pinprick, temperature, vibration COORDINATION: finger-nose-finger, fine finger movements  normal REFLEXES: deep tendon reflexes present and symmetric; TRACE AT ANKLES GAIT/STATION: narrow based gait; ANTALGIC, SLOW GAIT; romberg is negative    DIAGNOSTIC DATA (LABS, IMAGING,  TESTING) - I reviewed patient records, labs, notes, testing and imaging myself where available.  Lab Results  Component Value Date   WBC 9.9 12/04/2014   HGB 14.0 12/04/2014   HCT 41.1 12/04/2014   MCV 90.3 12/04/2014   PLT 323 12/04/2014      Component Value Date/Time   NA 138 12/04/2014 1303   K 4.0 12/04/2014 1303   CL 102 12/04/2014 1303   CO2 29 12/04/2014 1303   GLUCOSE 106* 12/04/2014 1303   BUN 12 12/04/2014 1303   CREATININE 0.86 12/04/2014 1303   CALCIUM 9.9 12/04/2014 1303   PROT 7.4 12/04/2014 1303   ALBUMIN 4.5 12/04/2014 1303   AST 37 12/04/2014 1303   ALT 24 12/04/2014 1303   ALKPHOS 53 12/04/2014 1303   BILITOT  0.7 12/04/2014 1303   GFRNONAA 62* 12/04/2014 1303   GFRAA 72* 12/04/2014 1303   No results found for: CHOL, HDL, LDLCALC, LDLDIRECT, TRIG, CHOLHDL No results found for: HGBA1C No results found for: VITAMINB12 Lab Results  Component Value Date   TSH 1.612 11/29/2012    12/04/14 CT HEAD / CERVICAL SPINE [I reviewed images myself and agree with interpretation. -VRP]  1. No acute intracranial findings. 2. Mild atrophy and microvascular disease. 3. No cervical spine fracture. 4. Mild disc osteophytic disease.     ASSESSMENT AND PLAN  81 y.o. year old female here with long-standing scoliosis, polymyalgia rheumatica, chronic pain and head and neck, shoulders. Neurologic examination unremarkable. Advised follow-up with PCP in pain management clinic. No further neurodiagnostic testing advised.   PLAN: - pain mgmt per Dr. Nelva Bush and PCP  Return if symptoms worsen or fail to improve, for return to PCP.    Penni Bombard, MD 5/85/2778, 24:23 AM Certified in Neurology, Neurophysiology and Neuroimaging  [facility] [address] [place], [place] [address] [phone]

## 2015-01-11 ENCOUNTER — Encounter: Payer: Self-pay | Admitting: Diagnostic Neuroimaging

## 2015-01-18 ENCOUNTER — Telehealth (HOSPITAL_COMMUNITY): Payer: Self-pay | Admitting: Cardiovascular Disease

## 2015-01-18 NOTE — Telephone Encounter (Signed)
Patient states she is still waiting to hear from [place] regarding her oxygen.  Patient states this has been ongoing since February of this year.

## 2015-01-18 NOTE — Telephone Encounter (Signed)
Returned call to patient she stated she has not heard anything about oxygen.She stated she wanted to ask Dr.Kelly if she needs to see a pulmonary Dr.Stated she wakes up 2 to 3 times at night sob.Stated she is unable to use CPAP uses mouth guard.Stated she thought she might need to be referred to pulmonary.Message sent to [facility] and Mariann Laster.

## 2015-01-22 NOTE — Telephone Encounter (Signed)
Check with Mariann Laster; I believe that we had prescribed this.

## 2015-01-23 NOTE — Telephone Encounter (Signed)
Returned a call to patient in reference to her oxygen. I informed the patient that we had already discussed this on 12/26/14 when she walked into the office. At that time I had notified her that the insurance was not going to cover the oxygen due to fact that she does not have a lung disorder.Dr. [name] and myself has been working for many weeks to find a way to get this covered using different diagnosis. We kept getting the same response from [facility]. The patient was informed by me that the cost for the oxygen would be $180.00/mo  and she stated to me that it was very doubtful that she would pay for this. I then told her that I will continue to try to get this covered. Meanwhile she made a appointment to see Dr. [name] in May and said that if we could not get it covered by then she will discuss it with him at that appointment. When I spoke with her today she was very upset that she still hasn't heard from anyone concerning this. I apologized to her and informed her that I thought that we had settled this issue. She asked me what does Dr. [name] want her to do. I told her that he wants her to have the oxygen. She then replies to me "then he needs to find a way for the insurance to pay for it." I again explained to her that we have exhausted all of the diagnosis codes that can be used without falsifying records. She became upset and stated that this has been going on since February and "someone can die before they get taken care of."  I assured her that this was not because of Dr. [name] or myself. We don't make the rules, we just have to follow them. We have been trying everything within our power to get this approved without success.

## 2015-01-26 DIAGNOSIS — R05 Cough: Secondary | ICD-10-CM | POA: Diagnosis not present

## 2015-01-26 DIAGNOSIS — F329 Major depressive disorder, single episode, unspecified: Secondary | ICD-10-CM | POA: Diagnosis not present

## 2015-02-20 ENCOUNTER — Ambulatory Visit (INDEPENDENT_AMBULATORY_CARE_PROVIDER_SITE_OTHER): Payer: Medicare Other | Admitting: Cardiovascular Disease

## 2015-02-20 ENCOUNTER — Encounter: Payer: Self-pay | Admitting: Cardiovascular Disease

## 2015-02-20 VITALS — BP 162/80 | HR 59 | Ht 63.0 in | Wt 123.4 lb

## 2015-02-20 DIAGNOSIS — I471 Supraventricular tachycardia: Secondary | ICD-10-CM

## 2015-02-20 DIAGNOSIS — M419 Scoliosis, unspecified: Secondary | ICD-10-CM | POA: Diagnosis not present

## 2015-02-20 DIAGNOSIS — G4730 Sleep apnea, unspecified: Secondary | ICD-10-CM

## 2015-02-20 DIAGNOSIS — R0789 Other chest pain: Secondary | ICD-10-CM | POA: Diagnosis not present

## 2015-02-20 DIAGNOSIS — R002 Palpitations: Secondary | ICD-10-CM

## 2015-02-20 MED ORDER — AMLODIPINE BESYLATE 2.5 MG PO TABS: 2.5000 mg | ORAL_TABLET | Freq: Every day | ORAL | Status: DC

## 2015-02-20 NOTE — Patient Instructions (Signed)
Your physician has recommended you make the following change in your medication: start new prescription for amlodipine 2.5 mg. This prescription has been sent to your [facility].  Your physician recommends that you schedule a follow-up appointment in: 3 months.

## 2015-02-23 ENCOUNTER — Encounter: Payer: Self-pay | Admitting: Cardiovascular Disease

## 2015-02-23 NOTE — Progress Notes (Signed)
Patient ID: Janet Nguyen, female   DOB: 11/08/1931, 81 y.o.   MRN: [id number]     HPI: Janet Nguyen is a 81 y.o. female  who presents for an followup cardiology evaluation.   Janet Nguyen has a history of documented SVT and also has PACs and PVCs which have been treated with beta blocker therapy. n April 2014 I further titrated her beta blocker therapy after cardiac event monitor did show several bursts of recurrent SVT up to 177 beats per minute in March.  In July after she had had 2 episodes of chest fluttering which each lasted over 30 minutes which he did take metoprolol tartrate with relief I recommended further titration of her Toprol to 75 mg in the morning and 50 mg at night and if necessary she could further titrate this to 75 twice a day.  She has a history of obstructive sleep apnea but despite multiple attempts at CPAP utilization she has not been able to tolerate this. She was referred to Dr. [name] and has a customized dental appliance with mandibular advancement with improvement in some of her symptomatology. Due concern that her teeth may be moving in more recently she has has not been using customized appliance daily but has been using her old non-customized mouthguard.    She presents today stating that he has at times shen has awakened abruptly from a dream with her heart pounding and a sensation of hot flashes, gasping for breath.   She also states that her scoliosis is getting worse. She does have left-sided musculoskeletal type chest pain due to her spine angulation.  Beause of her significant scoliosis, she feels she must sleep on her back.   She also has a history of GERD for which she has been taking Nexium.  She presents for evaluation.  When I last saw her, I scheduled her for an overnight oximetry to see if she is a candidate for supplemental oxygen at nighttime since she refuses to use CPAP and only very rarely uses her customized mouthpiece.  She does wear a  mouthguard to reduce bruxism.  Oximetry study was performed overnight on February 23/24.  Her mean oxygen saturation was 93.12%.  She spent 12 minutes and 16 seconds with O2 sat duration below 88% with the lowest O2 sat duration of 80%.  We'll try to set her up for supplemental oxygen at bedtime.  However, she has been denied for this on multiple occasions by her insurance/Medicare.  She feels that she is sleeping better.  She can only sleep in her back due to scoliosis.  Her left side is concave her right side is convex.  She has noticed her blood pressure has been increasing recently.  She presents for evaluation.     Past Medical History  Diagnosis Date  . Irregular heart beat 11/30/12    ECHO-EF 60-65%  . Celiac disease     treated by Dr. [name]  . GERD (gastroesophageal reflux disease)   . Osteoporosis   . Scoliosis   . Intervertebral disc stenosis of neural canal of cervical region   . Sleep apnea 10/02/11 [facility]    Sleep study AHI -total sleep 10.3/hr  64.0/ hr during REM sleep.RDI 22.8/hr during total sleep 64.0/hr during REM sleep The lowest O2 sat during Non-REM and REM sleep was 86% and 88% respectively. 04/08/12 [facility]  . Scoliosis   . Arrhythmia     History of  SVT with documented PVC'S and  PAC'S  12/08/12 Nuc stress test normal LV EF 74%  Event Monitor  12/01/12-01/03/13    Past Surgical History  Procedure Laterality Date  . Appendectomy      ruptured at age 31 and had surgery  . Cardiac catheterization  01/27/06    Allergies  Allergen Reactions  . Codeine Nausea Only  . Gluten Meal     Unknown    Current Outpatient Prescriptions  Medication Sig Dispense Refill  . acetaminophen (TYLENOL) 500 MG tablet Take 500 mg by mouth as needed for pain.    Marland Kitchen ALPRAZolam (XANAX) 0.25 MG tablet Take 0.25 mg by mouth as needed for anxiety.     Marland Kitchen amLODipine (NORVASC) 2.5 MG tablet Take 1 tablet (2.5 mg total)  by mouth daily. 30 tablet 6  . aspirin 81 MG tablet Take 81 mg by mouth every other day.     Marland Kitchen BIOTIN PO Take 1 tablet by mouth 2 (two) times daily.    . calcium-vitamin D (OSCAL) 250-125 MG-UNIT per tablet Take 1 tablet by mouth daily.    . Carbamide Peroxide (EAR DROPS OT) Place 1 drop in ear(s) as needed (For itching ears).    . clidinium-chlordiazePOXIDE (LIBRAX) 5-2.5 MG per capsule Take 1 capsule by mouth daily as needed (upset stomach).     . esomeprazole (NEXIUM) 20 MG capsule Take 20 mg by mouth every other day.     . Loratadine 10 MG CAPS Take 1 capsule by mouth daily as needed.    . metoprolol succinate (TOPROL-XL) 50 MG 24 hr tablet Take 1 and 1/2 tablet (25m) by mouth twice daily. 270 tablet 3  . Multiple Vitamin (MULTIVITAMIN WITH MINERALS) TABS Take 2 tablets by mouth daily.     . Wheat Dextrin (EQ FIBER POWDER) POWD Take by mouth.    . zolpidem (AMBIEN) 5 MG tablet Take 1 tablet (5 mg total) by mouth at bedtime as needed for sleep. 90 tablet 0   No current facility-administered medications for this visit.    Socially she is divorced has 4 children 9 grandchildren. She does exercise. No tobacco use. She does occasional wine.  ROS General: Negative; No fevers, chills, or night sweats;  HEENT: Negative; No changes in vision or hearing, sinus congestion, difficulty swallowing Pulmonary: Negative; No cough, wheezing, shortness of breath, hemoptysis Cardiovascular: Positive for occasional chest wall pain and nocturnal palpitations GI: Negative; No nausea, vomiting, diarrhea, or abdominal pain GU: Negative; No dysuria, hematuria, or difficulty voiding Musculoskeletal: Positive for significant scoliosis; fibromyalgia;  joint pain, or weakness Hematologic/Oncology: Negative; no easy bruising, bleeding Endocrine: Negative; no heat/cold intolerance; no diabetes Neuro: Negative; no changes in balance, headaches Skin: Negative; No rashes or skin lesions Psychiatric: Negative; No  behavioral problems, depression Sleep: Positive for sleep apnea ; No snoring, daytime sleepiness, hypersomnolence, bruxism, restless legs, hypnogognic hallucinations, no cataplexy Other comprehensive 14 point system review is negative.  PE BP 162/80 mmHg  Pulse 59  Ht 5' 3"  (1.6 m)  Wt 123 lb 6.4 oz (55.974 kg)  BMI 21.86 kg/m2  General: Alert, oriented, no distress.  Skin: normal turgor, no rashes HEENT: Normocephalic, atraumatic. Pupils round and reactive; sclera anicteric;no lid lag.  Nose without nasal septal hypertrophy Mouth/Parynx benign; Mallinpatti scale 3  Neck: No JVD, no carotid bruits without carotid upstroke Lungs: clear to ausculatation and percussion; no wheezing or rales Chest wall: Mild tenderness to palpation in the lateral left pectoral region/rib. She does have significant scoliosis.  Mild tenderness over the sternal notch and there does appear to be a small cyst over the left lateral aspect. Heart: RRR, s1 s2 normal 6 systolic murmur; no ectopy on prolonged auscultation Abdomen: soft, nontender; no hepatosplenomehaly, BS+; abdominal aorta nontender and not dilated by palpation. Back: No CVA tenderness. Pulses 2+ Extremities: no clubbing cyanosis or edema, Homan's sign negative  Neurologic: grossly nonfocal Psychological: Normal affect and mood  ECG (independently read by me): Sinus bradycardia 59 bpm.  No ectopy.  February 2016 ECG (independently read by me): Sinus bradycardia 56 bpm.  Normal intervals.  Prior ECG (independently read by me): Normal sinus rhythm at 62 beats per minute.  QTc interval 411 milliseconds.  QRS complex V1, V2.  Normal intervals.  ECG (independently read by me): Normal sinus rhythm at 62 beats per minute. Normal intervals. QTc interval 399 ms.  Prior ECG of 07/13/2013: Sinus rhythm at 61 beats per minute. QTc interval 406 ms. PR interval normal at 168 ms.  LABS: BMP Latest Ref Rng 12/04/2014 11/29/2012 11/29/2012  Glucose 70 - 99 mg/dL  106(H) - 106(H)  BUN 6 - 23 mg/dL 12 - 14  Creatinine 0.50 - 1.10 mg/dL 0.86 0.79 0.78  Sodium 135 - 145 mmol/L 138 - 139  Potassium 3.5 - 5.1 mmol/L 4.0 - 4.1  Chloride 96 - 112 mmol/L 102 - 101  CO2 19 - 32 mmol/L 29 - 26  Calcium 8.4 - 10.5 mg/dL 9.9 - 10.2   Hepatic Function Latest Ref Rng 12/04/2014 05/11/2010  Total Protein 6.0 - 8.3 g/dL 7.4 7.2  Albumin 3.5 - 5.2 g/dL 4.5 3.3(L)  AST 0 - 37 U/L 37 18  ALT 0 - 35 U/L 24 18  Alk Phosphatase 39 - 117 U/L 53 66  Total Bilirubin 0.3 - 1.2 mg/dL 0.7 0.6   CBC Latest Ref Rng 12/04/2014 11/29/2012 11/29/2012  WBC 4.0 - 10.5 K/uL 9.9 11.4(H) 11.2(H)  Hemoglobin 12.0 - 15.0 g/dL 14.0 13.5 13.0  Hematocrit 36.0 - 46.0 % 41.1 38.2 37.1  Platelets 150 - 400 K/uL 323 264 258   Lab Results  Component Value Date   TSH 1.612 11/29/2012  Lipid Panel  No results found for: CHOL, TRIG, HDL, CHOLHDL, VLDL, LDLCALC, LDLDIRECT   RADIOLOGY: No results found.    ASSESSMENT AND PLAN: Janet Nguyen is an 81  years old female with a history of documented SVT and PACs/PVCs which have been controlled in the past with beta blocker therapy. She has documented normal systolic function on her last echo in March 2014 with grade 1 diastolic dysfunction. She did have significant left atrial dilatation and mild dilatation of the right ventricle with moderate dilatation of the right atrium. She did have mild/moderate pulmonary hypertension with PA pressures of 42 mm. Her daily palpitations have improved on her current therapy of Toprol-XL 75 mg twice a day.  I suspect that her most recent episodes of waking up in the middle of the night during a dream with significant palpitations and gasping for breath may be contributed by her significant sleep apneic episode occurring during REM sleep in this patient who has previously documented moderate sleep apnea.  She sleeps on her back which may potentiate her sleep apnea.  She is bothered by her scoliosis which limits her  lateral position.  In the past, she had a customized appliance by Dr. Ron Parker but due to concerns of teeth movement she only rarely uses this.  She sleeps with a mouth guard to prevent bruxism.Marland Kitchen  We have tried CPAP therapy on several occasions but she has remained intolerant to any attempted CPAP treatment.  The past I have also recommended that she return to Dr. Oneal Grout for possible adjustment of her mouth piece.  I reviewed her oxygen oximetry study with her in detail.  We have tried to obtain nocturnal O2 for her, but apparently she is required to have a pulmonary diagnosis. It is very possible she may have restrictive lung disease due to her scoliosis, particularly with her left side concave and right side convex.  I discussed the possibility of obtaining PFT's for further evaluation.  Presently, however, she feels that she is sleeping well and would prefer not to pursue this.  Her blood pressure is increased.  I am adding amlodipine 2.5 mg to her medical regimen for optimal blood pressure control.  She will monitor her blood pressure at home.  I will see her in 3 months for reevaluation.  ycardia at 56 bpm.   I will see her in 6 months for reevaluation.  Troy Sine, MD, [facility]  02/23/2015 12:16 PM

## 2015-03-01 ENCOUNTER — Ambulatory Visit: Payer: Medicare Other | Admitting: Cardiovascular Disease

## 2015-03-06 DIAGNOSIS — M419 Scoliosis, unspecified: Secondary | ICD-10-CM | POA: Diagnosis not present

## 2015-03-06 DIAGNOSIS — M542 Cervicalgia: Secondary | ICD-10-CM | POA: Diagnosis not present

## 2015-03-06 DIAGNOSIS — M545 Low back pain: Secondary | ICD-10-CM | POA: Diagnosis not present

## 2015-03-14 ENCOUNTER — Telehealth: Payer: Self-pay | Admitting: Cardiovascular Disease

## 2015-03-14 NOTE — Telephone Encounter (Signed)
No answer at 2pm

## 2015-03-14 NOTE — Telephone Encounter (Signed)
Pt says she is having side effects from the Amlodipine,she was also on Prednisone. She is not taking either one of them as of today.

## 2015-03-16 NOTE — Telephone Encounter (Signed)
Spoke with pt, she had a bad reaction between the prednisone and amlodipine. She reports she had all the side effects that were listed on the report from the pharmacy. She now feels better. She has finished the prednisone and has stopped the amlodipine. She has a follow up appt with dr [name] on 05-03-15. Will make dr [name] aware.

## 2015-03-20 NOTE — Telephone Encounter (Signed)
acknowledged

## 2015-03-22 DIAGNOSIS — K219 Gastro-esophageal reflux disease without esophagitis: Secondary | ICD-10-CM | POA: Diagnosis not present

## 2015-03-22 DIAGNOSIS — R300 Dysuria: Secondary | ICD-10-CM | POA: Diagnosis not present

## 2015-03-22 DIAGNOSIS — M545 Low back pain: Secondary | ICD-10-CM | POA: Diagnosis not present

## 2015-03-22 DIAGNOSIS — Z79899 Other long term (current) drug therapy: Secondary | ICD-10-CM | POA: Diagnosis not present

## 2015-03-22 DIAGNOSIS — G479 Sleep disorder, unspecified: Secondary | ICD-10-CM | POA: Diagnosis not present

## 2015-03-22 DIAGNOSIS — Z0001 Encounter for general adult medical examination with abnormal findings: Secondary | ICD-10-CM | POA: Diagnosis not present

## 2015-03-22 DIAGNOSIS — F419 Anxiety disorder, unspecified: Secondary | ICD-10-CM | POA: Diagnosis not present

## 2015-03-22 DIAGNOSIS — M419 Scoliosis, unspecified: Secondary | ICD-10-CM | POA: Diagnosis not present

## 2015-03-26 ENCOUNTER — Other Ambulatory Visit: Payer: Self-pay

## 2015-03-28 DIAGNOSIS — H01003 Unspecified blepharitis right eye, unspecified eyelid: Secondary | ICD-10-CM | POA: Diagnosis not present

## 2015-03-28 DIAGNOSIS — H04123 Dry eye syndrome of bilateral lacrimal glands: Secondary | ICD-10-CM | POA: Diagnosis not present

## 2015-04-04 DIAGNOSIS — D729 Disorder of white blood cells, unspecified: Secondary | ICD-10-CM | POA: Diagnosis not present

## 2015-04-04 DIAGNOSIS — G479 Sleep disorder, unspecified: Secondary | ICD-10-CM | POA: Diagnosis not present

## 2015-04-04 DIAGNOSIS — J329 Chronic sinusitis, unspecified: Secondary | ICD-10-CM | POA: Diagnosis not present

## 2015-04-09 ENCOUNTER — Telehealth: Payer: Self-pay | Admitting: Cardiovascular Disease

## 2015-04-09 MED ORDER — METOPROLOL SUCCINATE ER 50 MG PO TB24: ORAL_TABLET | ORAL | Status: DC

## 2015-04-09 NOTE — Telephone Encounter (Signed)
°  1. Which medications need to be refilled? Metropolol   2. Which pharmacy is medication to be sent to?[facility] on [address]   3. Do they need a 30 day or 90 day supply? 90  4. Would they like a call back once the medication has been sent to the pharmacy? Yes

## 2015-04-09 NOTE — Telephone Encounter (Signed)
Refill submitted to patient's preferred pharmacy. Informed patient. Pt voiced understanding, no other stated concerns at this time.  

## 2015-04-10 ENCOUNTER — Telehealth: Payer: Self-pay | Admitting: Cardiovascular Disease

## 2015-04-10 NOTE — Telephone Encounter (Signed)
Janet Nguyen is calling because the Metoprolol in which she is taking is causing her to have reactions (Lot of Side of Effects) from it . Please call. FYI: Will be leaving her home in afew minutes can call her on tomorrow . Thanks

## 2015-04-10 NOTE — Telephone Encounter (Signed)
No answer - reattempt tomorrow oer pt instruction.

## 2015-04-11 ENCOUNTER — Telehealth: Payer: Self-pay | Admitting: Cardiovascular Disease

## 2015-04-11 NOTE — Telephone Encounter (Signed)
PA determination approved by [facility] - pt notified - she verbalized thanks.

## 2015-04-11 NOTE — Telephone Encounter (Signed)
Expedited PA sent to [facility], pt called to notify - left msg.

## 2015-04-11 NOTE — Telephone Encounter (Signed)
Spoke to patient - she is having trouble getting refill for metoprolol succ. [employer] insurance contacted her about this - had apparently faxed PA form to our office. Checked w/ Mariann Laster, checked Dr. Evette Georges file, nothing from them located.  [facility], determined dispense for dose requirement. Asked them to fax PA forms to our office - verified fax number over the phone.

## 2015-04-11 NOTE — Telephone Encounter (Signed)
Duplicate msg.

## 2015-04-11 NOTE — Telephone Encounter (Signed)
Follow Up    Pt is returning call from earlier. Please call.

## 2015-04-11 NOTE — Telephone Encounter (Signed)
Left msg for pt to call back

## 2015-04-28 DIAGNOSIS — R110 Nausea: Secondary | ICD-10-CM | POA: Diagnosis not present

## 2015-05-02 DIAGNOSIS — M5092 Cervical disc disorder, unspecified, mid-cervical region: Secondary | ICD-10-CM | POA: Diagnosis not present

## 2015-05-02 DIAGNOSIS — M542 Cervicalgia: Secondary | ICD-10-CM | POA: Diagnosis not present

## 2015-05-03 ENCOUNTER — Encounter: Payer: Self-pay | Admitting: Cardiovascular Disease

## 2015-05-03 ENCOUNTER — Ambulatory Visit (INDEPENDENT_AMBULATORY_CARE_PROVIDER_SITE_OTHER): Payer: Medicare Other | Admitting: Cardiovascular Disease

## 2015-05-03 VITALS — BP 122/76 | HR 63 | Ht 63.0 in | Wt 122.9 lb

## 2015-05-03 DIAGNOSIS — M797 Fibromyalgia: Secondary | ICD-10-CM

## 2015-05-03 DIAGNOSIS — G4730 Sleep apnea, unspecified: Secondary | ICD-10-CM

## 2015-05-03 DIAGNOSIS — M419 Scoliosis, unspecified: Secondary | ICD-10-CM

## 2015-05-03 DIAGNOSIS — R002 Palpitations: Secondary | ICD-10-CM

## 2015-05-03 DIAGNOSIS — R0789 Other chest pain: Secondary | ICD-10-CM

## 2015-05-03 NOTE — Patient Instructions (Signed)
Your physician wants you to follow-up in: 6 months or sooner. You will receive a reminder letter in the mail two months in advance. If you don't receive a letter, please call our office to schedule the follow-up appointment.

## 2015-05-05 ENCOUNTER — Encounter: Payer: Self-pay | Admitting: Cardiovascular Disease

## 2015-05-05 NOTE — Progress Notes (Signed)
Patient ID: ANASTAZJA ISAAC, female   DOB: 1932-01-19, 81 y.o.   MRN: [id number]     HPI: MARGUERITA STAPP is a 81 y.o. female  who presents for a 3 month followup cardiology evaluation.   Ms. Krausz has a history of documented SVT and also has PACs and PVCs which have been treated with beta blocker therapy. n April 2014 I further titrated her beta blocker therapy after cardiac event monitor did show several bursts of recurrent SVT up to 177 beats per minute in March.  In July after she had had 2 episodes of chest fluttering which each lasted over 30 minutes which he did take metoprolol tartrate with relief I recommended further titration of her Toprol to 75 mg in the morning and 50 mg at night and if necessary she could further titrate this to 75 twice a day.  She has a history of obstructive sleep apnea but despite multiple attempts at CPAP utilization she has not been able to tolerate this. She was referred to Dr. [name] and has a customized dental appliance with mandibular advancement with improvement in some of her symptomatology. Due concern that her teeth may be moving in more recently she has has not been using customized appliance daily but has been using her old non-customized mouthguard.  At times shen has awakened abruptly from a dream with her heart pounding and a sensation of hot flashes, gasping for breath.   She also states that her scoliosis is getting worse. She does have left-sided musculoskeletal type chest pain due to her spine angulation.  Beause of her significant scoliosis, she feels she must sleep on her back.   She also has a history of GERD for which she has been taking Nexium.  She presents for evaluation.  I had scheduled her for an overnight oximetry to see if she is a candidate for supplemental oxygen at nighttime since she refuses to use CPAP and only very rarely uses her customized mouthpiece.  She does wear a mouthguard to reduce bruxism.  Oximetry study was  performed overnight on February 23/24.  Her mean oxygen saturation was 93.12%.  She spent 12 minutes and 16 seconds with O2 sat duration below 88% with the lowest O2 sat duration of 80%. I tried to set her up for supplemental oxygen at bedtime.  However, she has been denied for this on multiple occasions by her insurance/Medicare.  She feels that she is sleeping better.  She can only sleep in her back due to scoliosis.  Her left side is concave; her right side is convex.  I last saw her, she had noticed that her blood pressure was increasing.  At that time, her blood pressure was elevated at 162/80 in the office and I recommended institution of low-dose amlodipine at 2.5 mg.  Apparently, she never filled this prescription.  She subsequently has been sleeping with a mouth strap so that her mouth is closed.  In addition to her mouthguard.  She states that she is dreaming.  Her blood pressure did improve and when she had seen other physicians.  Her blood pressure had stabilized.  For this reason, she is not taking the amlodipine.  Her palpitations have improved and she takes Toprol 75 mg twice a day.  She admits now to significant dreaming which is suggestive that she is attaining rams sleep.  She sees Dr. Bronson Ing, for primary care.  She had reduced her aspirin to every other day and would like  to get off this.   Past Medical History  Diagnosis Date  . Irregular heart beat 11/30/12    ECHO-EF 60-65%  . Celiac disease     treated by Dr. [name]  . GERD (gastroesophageal reflux disease)   . Osteoporosis   . Scoliosis   . Intervertebral disc stenosis of neural canal of cervical region   . Sleep apnea 10/02/11 [facility]    Sleep study AHI -total sleep 10.3/hr  64.0/ hr during REM sleep.RDI 22.8/hr during total sleep 64.0/hr during REM sleep The lowest O2 sat during Non-REM and REM sleep was 86% and 88% respectively. 04/08/12 CPAP/BIPAP titration study [facility]  .  Scoliosis   . Arrhythmia     History of SVT with documented PVC'S and  PAC'S  12/08/12 Nuc stress test normal LV EF 74%  Event Monitor  12/01/12-01/03/13    Past Surgical History  Procedure Laterality Date  . Appendectomy      ruptured at age 10 and had surgery  . Cardiac catheterization  01/27/06    Allergies  Allergen Reactions  . Codeine Nausea Only  . Gluten Meal     Unknown    Current Outpatient Prescriptions  Medication Sig Dispense Refill  . acetaminophen (TYLENOL) 500 MG tablet Take 500 mg by mouth as needed for pain.    Marland Kitchen ALPRAZolam (XANAX) 0.25 MG tablet Take 0.25 mg by mouth as needed for anxiety.     Marland Kitchen aspirin 81 MG tablet Take 81 mg by mouth every other day.     Marland Kitchen BIOTIN PO Take 1 tablet by mouth 2 (two) times daily.    . calcium-vitamin D (OSCAL) 250-125 MG-UNIT per tablet Take 1 tablet by mouth daily.    . Carbamide Peroxide (EAR DROPS OT) Place 1 drop in ear(s) as needed (For itching ears).    . clidinium-chlordiazePOXIDE (LIBRAX) 5-2.5 MG per capsule Take 1 capsule by mouth daily as needed (upset stomach).     . esomeprazole (NEXIUM) 20 MG capsule Take 20 mg by mouth every other day.     . Loratadine 10 MG CAPS Take 1 capsule by mouth daily as needed.    . metoprolol succinate (TOPROL-XL) 50 MG 24 hr tablet Take 1 and 1/2 tablet (8m) by mouth twice daily. 270 tablet 1  . Multiple Vitamin (MULTIVITAMIN WITH MINERALS) TABS Take 2 tablets by mouth daily.     .Marland Kitchenzolpidem (AMBIEN) 5 MG tablet Take 1 tablet (5 mg total) by mouth at bedtime as needed for sleep. 90 tablet 0   No current facility-administered medications for this visit.    Socially she is divorced has 4 children 9 grandchildren. She does exercise. No tobacco use. She does occasional wine.  ROS General: Negative; No fevers, chills, or night sweats;  HEENT: Negative; No changes in vision or hearing, sinus congestion, difficulty swallowing Pulmonary: Negative; No cough, wheezing, shortness of breath,  hemoptysis Cardiovascular: Positive for occasional chest wall pain and nocturnal palpitations GI: Negative; No nausea, vomiting, diarrhea, or abdominal pain GU: Negative; No dysuria, hematuria, or difficulty voiding Musculoskeletal: Positive for significant scoliosis; fibromyalgia;  joint pain, or weakness Hematologic/Oncology: Negative; no easy bruising, bleeding Endocrine: Negative; no heat/cold intolerance; no diabetes Neuro: Negative; no changes in balance, headaches Skin: Negative; No rashes or skin lesions Psychiatric: Negative; No behavioral problems, depression Sleep: Positive for sleep apnea ; No snoring, daytime sleepiness, hypersomnolence, bruxism, restless legs, hypnogognic hallucinations, no cataplexy Other comprehensive 14 point system review is negative.  PE BP 122/76 mmHg  Pulse 63  Ht 5' 3"  (1.6 m)  Wt 122 lb 14.4 oz (55.747 kg)  BMI 21.78 kg/m2   Wt Readings from Last 3 Encounters:  05/03/15 122 lb 14.4 oz (55.747 kg)  02/20/15 123 lb 6.4 oz (55.974 kg)  01/10/15 124 lb 6.4 oz (56.427 kg)   General: Alert, oriented, no distress.  Skin: normal turgor, no rashes HEENT: Normocephalic, atraumatic. Pupils round and reactive; sclera anicteric;no lid lag.  Nose without nasal septal hypertrophy Mouth/Parynx benign; Mallinpatti scale 3  Neck: No JVD, no carotid bruits without carotid upstroke Lungs: clear to ausculatation and percussion; no wheezing or rales Chest wall: Mild tenderness to palpation in the lateral left pectoral region/rib. She does have significant scoliosis.  Mild tenderness over the sternal notch and there does appear to be a small cyst over the left lateral aspect. Heart: RRR, s1 s2 normal 6 systolic murmur; no ectopy on prolonged auscultation Abdomen: soft, nontender; no hepatosplenomehaly, BS+; abdominal aorta nontender and not dilated by palpation. Back: No CVA tenderness. Pulses 2+ Extremities: no clubbing cyanosis or edema, Homan's sign negative    Neurologic: grossly nonfocal Psychological: Normal affect and mood  ECG (independently read by me): Normal sinus rhythm at 63 bpm.  QRS couplets V1 V2.  No cigarette ST-T changes.  May 2016 ECG (independently read by me): Sinus bradycardia 59 bpm.  No ectopy.  February 2016 ECG (independently read by me): Sinus bradycardia 56 bpm.  Normal intervals.  Prior ECG (independently read by me): Normal sinus rhythm at 62 beats per minute.  QTc interval 411 milliseconds.  QRS complex V1, V2.  Normal intervals.  ECG (independently read by me): Normal sinus rhythm at 62 beats per minute. Normal intervals. QTc interval 399 ms.  Prior ECG of 07/13/2013: Sinus rhythm at 61 beats per minute. QTc interval 406 ms. PR interval normal at 168 ms.  LABS: BMP Latest Ref Rng 12/04/2014 11/29/2012 11/29/2012  Glucose 70 - 99 mg/dL 106(H) - 106(H)  BUN 6 - 23 mg/dL 12 - 14  Creatinine 0.50 - 1.10 mg/dL 0.86 0.79 0.78  Sodium 135 - 145 mmol/L 138 - 139  Potassium 3.5 - 5.1 mmol/L 4.0 - 4.1  Chloride 96 - 112 mmol/L 102 - 101  CO2 19 - 32 mmol/L 29 - 26  Calcium 8.4 - 10.5 mg/dL 9.9 - 10.2   Hepatic Function Latest Ref Rng 12/04/2014 05/11/2010  Total Protein 6.0 - 8.3 g/dL 7.4 7.2  Albumin 3.5 - 5.2 g/dL 4.5 3.3(L)  AST 0 - 37 U/L 37 18  ALT 0 - 35 U/L 24 18  Alk Phosphatase 39 - 117 U/L 53 66  Total Bilirubin 0.3 - 1.2 mg/dL 0.7 0.6   CBC Latest Ref Rng 12/04/2014 11/29/2012 11/29/2012  WBC 4.0 - 10.5 K/uL 9.9 11.4(H) 11.2(H)  Hemoglobin 12.0 - 15.0 g/dL 14.0 13.5 13.0  Hematocrit 36.0 - 46.0 % 41.1 38.2 37.1  Platelets 150 - 400 K/uL 323 264 258   Lab Results  Component Value Date   TSH 1.612 11/29/2012  Lipid Panel  No results found for: CHOL, TRIG, HDL, CHOLHDL, VLDL, LDLCALC, LDLDIRECT   RADIOLOGY: No results found.    ASSESSMENT AND PLAN: Ms. Hughart is an 81  years old female with a history of documented SVT and PACs/PVCs which have been controlled with beta blocker therapy. She has documented  normal systolic function on her last echo in March 2014 with grade 1 diastolic dysfunction. She did have significant left  atrial dilatation and mild dilatation of the right ventricle with moderate dilatation of the right atrium. She has mild/moderate pulmonary hypertension with PA pressures of 42 mm. Her daily palpitations have improved on her current therapy of Toprol-XL 75 mg twice a day.  She states that she is sleeping significantly better with her new chin strap and old mouth guard.  She previously had a customized oral appliance by Dr. Oneal Grout, but due to discomfort stopped using this.  Her nocturnal palpitations have essentially resolved.  It seems as if she is reaching REM sleep since she is dreaming significantly.  Her ECG remained stable.  Her blood pressure today is normal and she never filled the very low-dose amlodipine prescription.  Presently, she will monitor her blood pressure.  She will continue with her current medical regimen.  As long as she remains stable, I will see her in one year for reevaluation.    Troy Sine, MD, [facility]  05/05/2015 11:30 AM

## 2015-05-14 DIAGNOSIS — M542 Cervicalgia: Secondary | ICD-10-CM | POA: Diagnosis not present

## 2015-05-14 DIAGNOSIS — M549 Dorsalgia, unspecified: Secondary | ICD-10-CM | POA: Diagnosis not present

## 2015-05-16 DIAGNOSIS — N952 Postmenopausal atrophic vaginitis: Secondary | ICD-10-CM | POA: Diagnosis not present

## 2015-05-16 DIAGNOSIS — N958 Other specified menopausal and perimenopausal disorders: Secondary | ICD-10-CM | POA: Diagnosis not present

## 2015-05-16 DIAGNOSIS — M8588 Other specified disorders of bone density and structure, other site: Secondary | ICD-10-CM | POA: Diagnosis not present

## 2015-05-18 DIAGNOSIS — M542 Cervicalgia: Secondary | ICD-10-CM | POA: Diagnosis not present

## 2015-05-18 DIAGNOSIS — M549 Dorsalgia, unspecified: Secondary | ICD-10-CM | POA: Diagnosis not present

## 2015-05-21 DIAGNOSIS — M542 Cervicalgia: Secondary | ICD-10-CM | POA: Diagnosis not present

## 2015-05-21 DIAGNOSIS — M549 Dorsalgia, unspecified: Secondary | ICD-10-CM | POA: Diagnosis not present

## 2015-05-28 DIAGNOSIS — K219 Gastro-esophageal reflux disease without esophagitis: Secondary | ICD-10-CM | POA: Diagnosis not present

## 2015-06-01 DIAGNOSIS — M549 Dorsalgia, unspecified: Secondary | ICD-10-CM | POA: Diagnosis not present

## 2015-06-01 DIAGNOSIS — M542 Cervicalgia: Secondary | ICD-10-CM | POA: Diagnosis not present

## 2015-06-06 DIAGNOSIS — M542 Cervicalgia: Secondary | ICD-10-CM | POA: Diagnosis not present

## 2015-06-06 DIAGNOSIS — M549 Dorsalgia, unspecified: Secondary | ICD-10-CM | POA: Diagnosis not present

## 2015-06-08 DIAGNOSIS — M542 Cervicalgia: Secondary | ICD-10-CM | POA: Diagnosis not present

## 2015-06-08 DIAGNOSIS — M549 Dorsalgia, unspecified: Secondary | ICD-10-CM | POA: Diagnosis not present

## 2015-06-12 DIAGNOSIS — G43809 Other migraine, not intractable, without status migrainosus: Secondary | ICD-10-CM | POA: Diagnosis not present

## 2015-06-12 DIAGNOSIS — R51 Headache: Secondary | ICD-10-CM | POA: Diagnosis not present

## 2015-06-12 DIAGNOSIS — H538 Other visual disturbances: Secondary | ICD-10-CM | POA: Diagnosis not present

## 2015-06-12 DIAGNOSIS — H04123 Dry eye syndrome of bilateral lacrimal glands: Secondary | ICD-10-CM | POA: Diagnosis not present

## 2015-06-13 DIAGNOSIS — M170 Bilateral primary osteoarthritis of knee: Secondary | ICD-10-CM | POA: Diagnosis not present

## 2015-06-13 DIAGNOSIS — M542 Cervicalgia: Secondary | ICD-10-CM | POA: Diagnosis not present

## 2015-06-15 DIAGNOSIS — Z23 Encounter for immunization: Secondary | ICD-10-CM | POA: Diagnosis not present

## 2015-06-26 DIAGNOSIS — R413 Other amnesia: Secondary | ICD-10-CM | POA: Diagnosis not present

## 2015-06-27 DIAGNOSIS — M170 Bilateral primary osteoarthritis of knee: Secondary | ICD-10-CM | POA: Diagnosis not present

## 2015-06-27 DIAGNOSIS — M25462 Effusion, left knee: Secondary | ICD-10-CM | POA: Diagnosis not present

## 2015-07-18 DIAGNOSIS — R0681 Apnea, not elsewhere classified: Secondary | ICD-10-CM | POA: Diagnosis not present

## 2015-07-18 DIAGNOSIS — G4733 Obstructive sleep apnea (adult) (pediatric): Secondary | ICD-10-CM | POA: Diagnosis not present

## 2015-07-18 DIAGNOSIS — K219 Gastro-esophageal reflux disease without esophagitis: Secondary | ICD-10-CM | POA: Diagnosis not present

## 2015-08-09 DIAGNOSIS — K900 Celiac disease: Secondary | ICD-10-CM | POA: Diagnosis not present

## 2015-08-09 DIAGNOSIS — K920 Hematemesis: Secondary | ICD-10-CM | POA: Diagnosis not present

## 2015-08-09 DIAGNOSIS — K2970 Gastritis, unspecified, without bleeding: Secondary | ICD-10-CM | POA: Diagnosis not present

## 2015-08-09 DIAGNOSIS — R042 Hemoptysis: Secondary | ICD-10-CM | POA: Diagnosis not present

## 2015-08-09 DIAGNOSIS — K317 Polyp of stomach and duodenum: Secondary | ICD-10-CM | POA: Diagnosis not present

## 2015-08-09 DIAGNOSIS — K219 Gastro-esophageal reflux disease without esophagitis: Secondary | ICD-10-CM | POA: Diagnosis not present

## 2015-08-13 DIAGNOSIS — H353122 Nonexudative age-related macular degeneration, left eye, intermediate dry stage: Secondary | ICD-10-CM | POA: Diagnosis not present

## 2015-08-13 DIAGNOSIS — H40013 Open angle with borderline findings, low risk, bilateral: Secondary | ICD-10-CM | POA: Diagnosis not present

## 2015-08-13 DIAGNOSIS — H353112 Nonexudative age-related macular degeneration, right eye, intermediate dry stage: Secondary | ICD-10-CM | POA: Diagnosis not present

## 2015-08-13 DIAGNOSIS — H26492 Other secondary cataract, left eye: Secondary | ICD-10-CM | POA: Diagnosis not present

## 2015-08-13 DIAGNOSIS — H26491 Other secondary cataract, right eye: Secondary | ICD-10-CM | POA: Diagnosis not present

## 2015-08-13 DIAGNOSIS — H43813 Vitreous degeneration, bilateral: Secondary | ICD-10-CM | POA: Diagnosis not present

## 2015-08-15 DIAGNOSIS — R0982 Postnasal drip: Secondary | ICD-10-CM | POA: Diagnosis not present

## 2015-08-15 DIAGNOSIS — R109 Unspecified abdominal pain: Secondary | ICD-10-CM | POA: Diagnosis not present

## 2015-08-15 DIAGNOSIS — G479 Sleep disorder, unspecified: Secondary | ICD-10-CM | POA: Diagnosis not present

## 2015-08-15 DIAGNOSIS — M549 Dorsalgia, unspecified: Secondary | ICD-10-CM | POA: Diagnosis not present

## 2015-08-19 ENCOUNTER — Encounter (HOSPITAL_COMMUNITY): Payer: Self-pay | Admitting: Emergency Medicine

## 2015-08-19 ENCOUNTER — Emergency Department (HOSPITAL_COMMUNITY)
Admission: EM | Admit: 2015-08-19 | Discharge: 2015-08-19 | Disposition: A | Discharge status: Home / Self Care | Payer: Medicare Other | Attending: Emergency Medicine | Admitting: Emergency Medicine

## 2015-08-19 DIAGNOSIS — Z8679 Personal history of other diseases of the circulatory system: Secondary | ICD-10-CM | POA: Diagnosis not present

## 2015-08-19 DIAGNOSIS — Z7982 Long term (current) use of aspirin: Secondary | ICD-10-CM | POA: Insufficient documentation

## 2015-08-19 DIAGNOSIS — Z9889 Other specified postprocedural states: Secondary | ICD-10-CM | POA: Diagnosis not present

## 2015-08-19 DIAGNOSIS — M5431 Sciatica, right side: Secondary | ICD-10-CM | POA: Diagnosis not present

## 2015-08-19 DIAGNOSIS — K219 Gastro-esophageal reflux disease without esophagitis: Secondary | ICD-10-CM | POA: Insufficient documentation

## 2015-08-19 DIAGNOSIS — Z79899 Other long term (current) drug therapy: Secondary | ICD-10-CM | POA: Diagnosis not present

## 2015-08-19 DIAGNOSIS — Z8669 Personal history of other diseases of the nervous system and sense organs: Secondary | ICD-10-CM | POA: Diagnosis not present

## 2015-08-19 DIAGNOSIS — M5441 Lumbago with sciatica, right side: Secondary | ICD-10-CM | POA: Diagnosis not present

## 2015-08-19 DIAGNOSIS — M549 Dorsalgia, unspecified: Secondary | ICD-10-CM | POA: Diagnosis not present

## 2015-08-19 LAB — BASIC METABOLIC PANEL
Anion gap: 11 (ref 5–15)
BUN: 8 mg/dL (ref 6–20)
CO2: 23 mmol/L (ref 22–32)
Calcium: 9.8 mg/dL (ref 8.9–10.3)
Chloride: 98 mmol/L — ABNORMAL LOW (ref 101–111)
Creatinine, Ser: 0.86 mg/dL (ref 0.44–1.00)
GFR calc Af Amer: >60 mL/min (ref >60)
GFR calc non Af Amer: >60 mL/min (ref >60)
Glucose, Bld: 113 mg/dL — ABNORMAL HIGH (ref 65–99)
Potassium: 3.8 mmol/L (ref 3.5–5.1)
Sodium: 132 mmol/L — ABNORMAL LOW (ref 135–145)

## 2015-08-19 LAB — URINALYSIS, ROUTINE W REFLEX MICROSCOPIC
Bilirubin Urine: NEGATIVE
Glucose, UA: NEGATIVE mg/dL
Ketones, ur: 40 mg/dL — AB
Nitrite: NEGATIVE
Protein, ur: NEGATIVE mg/dL
Specific Gravity, Urine: 1.011 (ref 1.005–1.030)
pH: 6 (ref 5.0–8.0)

## 2015-08-19 LAB — CBC WITH DIFFERENTIAL/PLATELET
Basophils Absolute: 0 10*3/uL (ref 0.0–0.1)
Basophils Relative: 0 %
Eosinophils Absolute: 0 10*3/uL (ref 0.0–0.7)
Eosinophils Relative: 0 %
HCT: 39.5 % (ref 36.0–46.0)
Hemoglobin: 13.4 g/dL (ref 12.0–15.0)
Lymphocytes Relative: 15 %
Lymphs Abs: 2.2 10*3/uL (ref 0.7–4.0)
MCH: 30.7 pg (ref 26.0–34.0)
MCHC: 33.9 g/dL (ref 30.0–36.0)
MCV: 90.6 fL (ref 78.0–100.0)
Monocytes Absolute: 2.2 10*3/uL — ABNORMAL HIGH (ref 0.1–1.0)
Monocytes Relative: 15 %
Neutro Abs: 10.5 10*3/uL — ABNORMAL HIGH (ref 1.7–7.7)
Neutrophils Relative %: 70 %
Platelets: 231 10*3/uL (ref 150–400)
RBC: 4.36 MIL/uL (ref 3.87–5.11)
RDW: 12.4 % (ref 11.5–15.5)
WBC: 15 10*3/uL — ABNORMAL HIGH (ref 4.0–10.5)

## 2015-08-19 LAB — URINE MICROSCOPIC-ADD ON

## 2015-08-19 MED ORDER — SODIUM CHLORIDE 0.9 % IV BOLUS (SEPSIS)
1000.0000 mL | Freq: Once | INTRAVENOUS | Status: AC
  Administered 2015-08-19: 1000 mL via INTRAVENOUS

## 2015-08-19 MED ORDER — ONDANSETRON 4 MG PO TBDP
ORAL_TABLET | ORAL | Status: DC
Start: 2015-08-19 — End: 2015-10-05

## 2015-08-19 MED ORDER — ONDANSETRON 4 MG PO TBDP
4.0000 mg | ORAL_TABLET | Freq: Once | ORAL | Status: DC
  Filled 2015-08-19: qty 1

## 2015-08-19 MED ORDER — HYDROCODONE-ACETAMINOPHEN 5-325 MG PO TABS: 1.0000 | ORAL_TABLET | ORAL | Status: DC | PRN

## 2015-08-19 MED ORDER — OXYCODONE-ACETAMINOPHEN 5-325 MG PO TABS
1.0000 | ORAL_TABLET | Freq: Once | ORAL | Status: AC
  Administered 2015-08-19: 1 via ORAL
  Filled 2015-08-19: qty 1

## 2015-08-19 MED ORDER — ONDANSETRON 4 MG PO TBDP
4.0000 mg | ORAL_TABLET | Freq: Once | ORAL | Status: AC | PRN
  Administered 2015-08-19: 4 mg via ORAL
  Filled 2015-08-19: qty 1

## 2015-08-19 MED ORDER — ONDANSETRON 4 MG PO TBDP
4.0000 mg | ORAL_TABLET | Freq: Once | ORAL | Status: AC
  Administered 2015-08-19: 4 mg via ORAL
  Filled 2015-08-19: qty 1

## 2015-08-19 MED ORDER — KETOROLAC TROMETHAMINE 60 MG/2ML IM SOLN
30.0000 mg | Freq: Once | INTRAMUSCULAR | Status: AC
  Administered 2015-08-19: 30 mg via INTRAMUSCULAR
  Filled 2015-08-19: qty 2

## 2015-08-19 NOTE — ED Notes (Signed)
Patient able to ambulate with min assist.  Patient able to get into bed without assistance. Patient states when she walks she becomes nausea when walking.  No vomiting noted. Patient offered Zofran  Patient states she does not want to take. MD made aware.

## 2015-08-19 NOTE — ED Notes (Signed)
Reported to Dr. Colin Rhein, pt. Declined the Zofran . Pt. s family member stead, "She received that already, it is not helping."

## 2015-08-19 NOTE — Progress Notes (Signed)
PT Cancellation Note  Janet Nguyen, Janet Nguyen DOB: Nov 22, 1931   Spoke with family. Patient reports she is too nauseous to participate or even speak with PT. Family reports patient complains of back pain and not having much of an appetite for the past 3 days. Note reports daughter will stay with patient tonight and son plans to arrange for private sitter to care for patient tomorrow AM. Per notes, patient was ambulatory with min assist. Recommended patient use a rolling walker for increased stability. Consider home health PT evaluation.   [address] [place], [place]   08/19/2015 1358

## 2015-08-19 NOTE — ED Notes (Signed)
Family has requested the pt. To be admitted.  Dr. Colin Rhein in at the bedside speaking with the family and pt.

## 2015-08-19 NOTE — ED Notes (Signed)
Patient states saw PCP last week was given Tramadol states took a few times no relief.

## 2015-08-19 NOTE — ED Notes (Signed)
Patient placed ob bedpan

## 2015-08-19 NOTE — Progress Notes (Addendum)
81 y.o. F seen in the ED today for Back Pain. CM consulted by Nursing staff as family is concerned about pt returning home alone and  possibility that she might fall. Independent [place]. Adult children at bedside 2 of 4, 1 of which lives in [place], all of which work full time. Daughter has agreed to stay with mother tonight and son plans to make calls to Private sitters in am and make arrangements for 24/7 care/assistance. CM spoke candidly with pt and children about choices at hand. 1) Need for 24/7 supervision until acute back pain resolves, Self Pay Private Duty Resources List given to family  2) Must have qualifying hospital stay x 3 MN's for SNF placement from Hospital, which she does not have.  3) Will need PT evaluation to assess mobility. CM will arrange [place], [place], and [place] aide if needed to follow progress. Pt agreeable to [facility]. 4) Finally,  made family aware they can continue this process with PCP  If  ST SNF placement is the final goal.   Family and pt appreciative of information.

## 2015-08-19 NOTE — Progress Notes (Signed)
Referral to [facility] with [facility]. Bedside RN able to ambulate pt with minimal assistance. Will advise MD that Home with [facility] plans is viable plan.

## 2015-08-19 NOTE — ED Notes (Signed)
PT at bedside.

## 2015-08-19 NOTE — ED Provider Notes (Signed)
CSN: [id number]     Arrival date & time 08/19/15  7209 History   First MD Initiated Contact with Patient 08/19/15 [phone]     Chief Complaint  Patient presents with  . Back Pain     (Consider location/radiation/quality/duration/timing/severity/associated sxs/prior Treatment) Patient is a 81 y.o. female presenting with back pain.  Back Pain Location:  Sacro-iliac joint Quality:  Aching Radiates to:  Does not radiate Pain severity:  Moderate Pain is:  Same all the time Onset quality:  Gradual Duration:  1 week Timing:  Constant Progression:  Unchanged Chronicity:  New Context: not falling, not physical stress, not recent illness and not recent injury   Relieved by:  Nothing Worsened by:  Movement Associated symptoms: no abdominal pain, no dysuria, no fever, no headaches, no leg pain and no numbness     Past Medical History  Diagnosis Date  . Irregular heart beat 11/30/12    ECHO-EF 60-65%  . Celiac disease     treated by Dr. [name]  . GERD (gastroesophageal reflux disease)   . Osteoporosis   . Scoliosis   . Intervertebral disc stenosis of neural canal of cervical region   . Sleep apnea 10/02/11 [facility]    Sleep study AHI -total sleep 10.3/hr  64.0/ hr during REM sleep.RDI 22.8/hr during total sleep 64.0/hr during REM sleep The lowest O2 sat during Non-REM and REM sleep was 86% and 88% respectively. 04/08/12 CPAP/BIPAP titration study [facility]  . Scoliosis   . Arrhythmia     History of SVT with documented PVC'S and  PAC'S  12/08/12 Nuc stress test normal LV EF 74%  Event Monitor  12/01/12-01/03/13   Past Surgical History  Procedure Laterality Date  . Appendectomy      ruptured at age 33 and had surgery  . Cardiac catheterization  01/27/06   Family History  Problem Relation Age of Onset  . Heart disease Father   . Breast cancer Mother    Social History  Substance Use Topics  . Smoking status: Never Smoker   . Smokeless  tobacco: Never Used  . Alcohol Use: 0.6 oz/week    1 Standard drinks or equivalent per week     Comment: drink ETOH socially   OB History    No data available     Review of Systems  Constitutional: Negative for fever.  Gastrointestinal: Negative for abdominal pain.  Genitourinary: Negative for dysuria.  Musculoskeletal: Positive for back pain.  Neurological: Negative for numbness and headaches.  All other systems reviewed and are negative.     Allergies  Codeine and Gluten meal  Home Medications   Prior to Admission medications   Medication Sig Start Date End Date Taking? Authorizing Provider  acetaminophen (TYLENOL) 500 MG tablet Take 500 mg by mouth as needed for pain.   Yes Historical Provider, MD  ALPRAZolam Duanne Moron) 0.25 MG tablet Take 0.25 mg by mouth as needed for anxiety.  02/05/13  Yes Historical Provider, MD  BIOTIN PO Take 1 tablet by mouth 2 (two) times daily.   Yes Historical Provider, MD  calcium-vitamin D (OSCAL) 250-125 MG-UNIT per tablet Take 1 tablet by mouth daily.   Yes Historical Provider, MD  Carbamide Peroxide (EAR DROPS OT) Place 1 drop in ear(s) as needed (For itching ears).   Yes Historical Provider, MD  clidinium-chlordiazePOXIDE (LIBRAX) 5-2.5 MG per capsule Take 1 capsule by mouth daily as needed (upset stomach).  11/15/14  Yes Historical Provider, MD  esomeprazole (  NEXIUM) 20 MG capsule Take 20 mg by mouth every other day.    Yes Historical Provider, MD  FIBER COMPLETE PO Take 1 tablet by mouth daily.   Yes Historical Provider, MD  Loratadine 10 MG CAPS Take 1 capsule by mouth daily as needed.   Yes Historical Provider, MD  metoprolol succinate (TOPROL-XL) 50 MG 24 hr tablet Take 1 and 1/2 tablet (72m) by mouth twice daily. 04/09/15  Yes TTroy Sine MD  Multiple Vitamin (MULTIVITAMIN WITH MINERALS) TABS Take 2 tablets by mouth daily.    Yes Historical Provider, MD  traMADol (ULTRAM) 50 MG tablet Take 50 mg by mouth every 6 (six) hours as needed for  moderate pain.   Yes Historical Provider, MD  zolpidem (AMBIEN CR) 6.25 MG CR tablet Take 6.25 mg by mouth at bedtime as needed for sleep.   Yes Historical Provider, MD  aspirin 81 MG tablet Take 81 mg by mouth every other day.     Historical Provider, MD  HYDROcodone-acetaminophen (NORCO/VICODIN) 5-325 MG tablet Take 1-2 tablets by mouth every 4 (four) hours as needed. 08/19/15   MDebby Freiberg MD  ondansetron (ZOFRAN ODT) 4 MG disintegrating tablet 489mODT q4 hours prn nausea/vomit 08/19/15   MaDebby FreibergMD   BP 146/68 mmHg  Pulse 78  Temp(Src) 98.2 F (36.8 C) (Oral)  Resp 18  SpO2 94% Physical Exam  Constitutional: She is oriented to person, place, and time. She appears well-developed and well-nourished.  HENT:  Head: Normocephalic and atraumatic.  Right Ear: External ear normal.  Left Ear: External ear normal.  Eyes: Conjunctivae and EOM are normal. Pupils are equal, round, and reactive to light.  Neck: Normal range of motion. Neck supple.  Cardiovascular: Normal rate, regular rhythm, normal heart sounds and intact distal pulses.   Pulmonary/Chest: Effort normal and breath sounds normal.  Abdominal: Soft. Bowel sounds are normal. There is no tenderness.  Musculoskeletal: Normal range of motion.       Lumbar back: She exhibits tenderness (R SI).  Neurological: She is alert and oriented to person, place, and time.  Skin: Skin is warm and dry.  Vitals reviewed.   ED Course  Procedures (including critical care time) Labs Review Labs Reviewed  URINALYSIS, ROUTINE W REFLEX MICROSCOPIC (NOT AT [facility]- Abnormal; Notable for the following:    Hgb urine dipstick TRACE (*)    Ketones, ur 40 (*)    Leukocytes, UA TRACE (*)    All other components within normal limits  CBC WITH DIFFERENTIAL/PLATELET - Abnormal; Notable for the following:    WBC 15.0 (*)    Neutro Abs 10.5 (*)    Monocytes Absolute 2.2 (*)    All other components within normal limits  BASIC METABOLIC PANEL -  Abnormal; Notable for the following:    Sodium 132 (*)    Chloride 98 (*)    Glucose, Bld 113 (*)    All other components within normal limits  URINE MICROSCOPIC-ADD ON - Abnormal; Notable for the following:    Squamous Epithelial / LPF 0-5 (*)    Bacteria, UA RARE (*)    All other components within normal limits    Imaging Review No results found. I have personally reviewed and evaluated these images and lab results as part of my medical decision-making.   EKG Interpretation None      MDM   Final diagnoses:  Sciatica of right side    [age].o. female with pertinent PMH of prior SVT, GERD  presents with atraumatic R lower back pain x 1 week with decreased PO intake x 2 weeks.  She is currently undergoing wu for chronic dysphagia and recently had EGD with biopsy.  Results for this are pending. On arrival today the patient has vital signs and physical exam as above. She has exactly production of symptoms with palpation of the right paraspinal muscles over the SI joint. She has a positive straight leg test. She denies historical elements of cauda equina. She has no frank numbness, has been able to ambulate home with assistance.  Workup as above unremarkable. Do not feel imaging warranted given lack of trauma, no concerning elements for cord pathology. Given percocet with subsequent nausea, no vomiting.  Case management and social worker consulted. I have arranged for home health care follow-up. The patient and her family have a degree of concern about the patient's ability to care for herself at home, however they readily admit that is not a new issue. I do not see indication for acute hospitalization patient at this time. I had a long and frank discussion with patient and family regarding return precautions, they voiced understanding and agreed to follow-up. Discharged home in stable condition.  I have reviewed all laboratory and imaging studies if ordered as above  1. Sciatica of right  side         Debby Freiberg, MD 08/19/15 [phone]

## 2015-08-19 NOTE — ED Notes (Signed)
Patient comes from home with complaints of choric back pain. Patient states had pain years ago and went away and came back a few months ago. Patient states she takes tylenol for the back pain and medication has not been working.

## 2015-08-19 NOTE — Discharge Instructions (Signed)

## 2015-08-20 DIAGNOSIS — M419 Scoliosis, unspecified: Secondary | ICD-10-CM | POA: Diagnosis not present

## 2015-08-20 DIAGNOSIS — R32 Unspecified urinary incontinence: Secondary | ICD-10-CM | POA: Diagnosis not present

## 2015-08-20 DIAGNOSIS — M5430 Sciatica, unspecified side: Secondary | ICD-10-CM | POA: Diagnosis not present

## 2015-08-20 DIAGNOSIS — R110 Nausea: Secondary | ICD-10-CM | POA: Diagnosis not present

## 2015-08-20 DIAGNOSIS — R600 Localized edema: Secondary | ICD-10-CM | POA: Diagnosis not present

## 2015-08-21 DIAGNOSIS — M5430 Sciatica, unspecified side: Secondary | ICD-10-CM | POA: Diagnosis not present

## 2015-08-21 DIAGNOSIS — R32 Unspecified urinary incontinence: Secondary | ICD-10-CM | POA: Diagnosis not present

## 2015-08-21 DIAGNOSIS — M419 Scoliosis, unspecified: Secondary | ICD-10-CM | POA: Diagnosis not present

## 2015-08-21 DIAGNOSIS — R600 Localized edema: Secondary | ICD-10-CM | POA: Diagnosis not present

## 2015-08-21 DIAGNOSIS — R110 Nausea: Secondary | ICD-10-CM | POA: Diagnosis not present

## 2015-08-24 DIAGNOSIS — R600 Localized edema: Secondary | ICD-10-CM | POA: Diagnosis not present

## 2015-08-24 DIAGNOSIS — R32 Unspecified urinary incontinence: Secondary | ICD-10-CM | POA: Diagnosis not present

## 2015-08-24 DIAGNOSIS — M419 Scoliosis, unspecified: Secondary | ICD-10-CM | POA: Diagnosis not present

## 2015-08-24 DIAGNOSIS — M5430 Sciatica, unspecified side: Secondary | ICD-10-CM | POA: Diagnosis not present

## 2015-08-24 DIAGNOSIS — R110 Nausea: Secondary | ICD-10-CM | POA: Diagnosis not present

## 2015-08-27 DIAGNOSIS — K5900 Constipation, unspecified: Secondary | ICD-10-CM | POA: Diagnosis not present

## 2015-08-27 DIAGNOSIS — R1013 Epigastric pain: Secondary | ICD-10-CM | POA: Diagnosis not present

## 2015-08-28 DIAGNOSIS — G479 Sleep disorder, unspecified: Secondary | ICD-10-CM | POA: Diagnosis not present

## 2015-08-28 DIAGNOSIS — R51 Headache: Secondary | ICD-10-CM | POA: Diagnosis not present

## 2015-08-28 DIAGNOSIS — M545 Low back pain: Secondary | ICD-10-CM | POA: Diagnosis not present

## 2015-08-29 DIAGNOSIS — M549 Dorsalgia, unspecified: Secondary | ICD-10-CM | POA: Diagnosis not present

## 2015-08-29 DIAGNOSIS — R600 Localized edema: Secondary | ICD-10-CM | POA: Diagnosis not present

## 2015-08-29 DIAGNOSIS — M5136 Other intervertebral disc degeneration, lumbar region: Secondary | ICD-10-CM | POA: Diagnosis not present

## 2015-08-29 DIAGNOSIS — R110 Nausea: Secondary | ICD-10-CM | POA: Diagnosis not present

## 2015-08-29 DIAGNOSIS — R030 Elevated blood-pressure reading, without diagnosis of hypertension: Secondary | ICD-10-CM | POA: Diagnosis not present

## 2015-08-29 DIAGNOSIS — M5430 Sciatica, unspecified side: Secondary | ICD-10-CM | POA: Diagnosis not present

## 2015-08-29 DIAGNOSIS — M4155 Other secondary scoliosis, thoracolumbar region: Secondary | ICD-10-CM | POA: Diagnosis not present

## 2015-08-29 DIAGNOSIS — M419 Scoliosis, unspecified: Secondary | ICD-10-CM | POA: Diagnosis not present

## 2015-08-29 DIAGNOSIS — M47816 Spondylosis without myelopathy or radiculopathy, lumbar region: Secondary | ICD-10-CM | POA: Diagnosis not present

## 2015-08-29 DIAGNOSIS — R32 Unspecified urinary incontinence: Secondary | ICD-10-CM | POA: Diagnosis not present

## 2015-08-29 DIAGNOSIS — M47814 Spondylosis without myelopathy or radiculopathy, thoracic region: Secondary | ICD-10-CM | POA: Diagnosis not present

## 2015-08-29 DIAGNOSIS — M5134 Other intervertebral disc degeneration, thoracic region: Secondary | ICD-10-CM | POA: Diagnosis not present

## 2015-08-29 DIAGNOSIS — M546 Pain in thoracic spine: Secondary | ICD-10-CM | POA: Diagnosis not present

## 2015-08-29 DIAGNOSIS — M545 Low back pain: Secondary | ICD-10-CM | POA: Diagnosis not present

## 2015-08-30 DIAGNOSIS — M419 Scoliosis, unspecified: Secondary | ICD-10-CM | POA: Diagnosis not present

## 2015-08-30 DIAGNOSIS — R110 Nausea: Secondary | ICD-10-CM | POA: Diagnosis not present

## 2015-08-30 DIAGNOSIS — R32 Unspecified urinary incontinence: Secondary | ICD-10-CM | POA: Diagnosis not present

## 2015-08-30 DIAGNOSIS — M5430 Sciatica, unspecified side: Secondary | ICD-10-CM | POA: Diagnosis not present

## 2015-08-30 DIAGNOSIS — R600 Localized edema: Secondary | ICD-10-CM | POA: Diagnosis not present

## 2015-09-03 DIAGNOSIS — R600 Localized edema: Secondary | ICD-10-CM | POA: Diagnosis not present

## 2015-09-03 DIAGNOSIS — R110 Nausea: Secondary | ICD-10-CM | POA: Diagnosis not present

## 2015-09-03 DIAGNOSIS — R32 Unspecified urinary incontinence: Secondary | ICD-10-CM | POA: Diagnosis not present

## 2015-09-03 DIAGNOSIS — M419 Scoliosis, unspecified: Secondary | ICD-10-CM | POA: Diagnosis not present

## 2015-09-03 DIAGNOSIS — M5430 Sciatica, unspecified side: Secondary | ICD-10-CM | POA: Diagnosis not present

## 2015-09-05 DIAGNOSIS — R110 Nausea: Secondary | ICD-10-CM | POA: Diagnosis not present

## 2015-09-05 DIAGNOSIS — R600 Localized edema: Secondary | ICD-10-CM | POA: Diagnosis not present

## 2015-09-05 DIAGNOSIS — M419 Scoliosis, unspecified: Secondary | ICD-10-CM | POA: Diagnosis not present

## 2015-09-05 DIAGNOSIS — M5430 Sciatica, unspecified side: Secondary | ICD-10-CM | POA: Diagnosis not present

## 2015-09-05 DIAGNOSIS — R32 Unspecified urinary incontinence: Secondary | ICD-10-CM | POA: Diagnosis not present

## 2015-09-06 DIAGNOSIS — R32 Unspecified urinary incontinence: Secondary | ICD-10-CM | POA: Diagnosis not present

## 2015-09-06 DIAGNOSIS — R600 Localized edema: Secondary | ICD-10-CM | POA: Diagnosis not present

## 2015-09-06 DIAGNOSIS — M419 Scoliosis, unspecified: Secondary | ICD-10-CM | POA: Diagnosis not present

## 2015-09-06 DIAGNOSIS — R110 Nausea: Secondary | ICD-10-CM | POA: Diagnosis not present

## 2015-09-06 DIAGNOSIS — M5430 Sciatica, unspecified side: Secondary | ICD-10-CM | POA: Diagnosis not present

## 2015-09-10 DIAGNOSIS — M25462 Effusion, left knee: Secondary | ICD-10-CM | POA: Diagnosis not present

## 2015-09-10 DIAGNOSIS — M170 Bilateral primary osteoarthritis of knee: Secondary | ICD-10-CM | POA: Diagnosis not present

## 2015-09-12 DIAGNOSIS — R600 Localized edema: Secondary | ICD-10-CM | POA: Diagnosis not present

## 2015-09-12 DIAGNOSIS — M5430 Sciatica, unspecified side: Secondary | ICD-10-CM | POA: Diagnosis not present

## 2015-09-12 DIAGNOSIS — M419 Scoliosis, unspecified: Secondary | ICD-10-CM | POA: Diagnosis not present

## 2015-09-12 DIAGNOSIS — R32 Unspecified urinary incontinence: Secondary | ICD-10-CM | POA: Diagnosis not present

## 2015-09-12 DIAGNOSIS — R110 Nausea: Secondary | ICD-10-CM | POA: Diagnosis not present

## 2015-09-14 DIAGNOSIS — M419 Scoliosis, unspecified: Secondary | ICD-10-CM | POA: Diagnosis not present

## 2015-09-14 DIAGNOSIS — M5430 Sciatica, unspecified side: Secondary | ICD-10-CM | POA: Diagnosis not present

## 2015-09-14 DIAGNOSIS — R600 Localized edema: Secondary | ICD-10-CM | POA: Diagnosis not present

## 2015-09-14 DIAGNOSIS — R32 Unspecified urinary incontinence: Secondary | ICD-10-CM | POA: Diagnosis not present

## 2015-09-14 DIAGNOSIS — R110 Nausea: Secondary | ICD-10-CM | POA: Diagnosis not present

## 2015-09-19 DIAGNOSIS — F411 Generalized anxiety disorder: Secondary | ICD-10-CM | POA: Diagnosis not present

## 2015-09-19 DIAGNOSIS — M25562 Pain in left knee: Secondary | ICD-10-CM | POA: Diagnosis not present

## 2015-09-19 DIAGNOSIS — D729 Disorder of white blood cells, unspecified: Secondary | ICD-10-CM | POA: Diagnosis not present

## 2015-09-19 DIAGNOSIS — R300 Dysuria: Secondary | ICD-10-CM | POA: Diagnosis not present

## 2015-09-21 DIAGNOSIS — M47816 Spondylosis without myelopathy or radiculopathy, lumbar region: Secondary | ICD-10-CM | POA: Diagnosis not present

## 2015-09-21 DIAGNOSIS — M4155 Other secondary scoliosis, thoracolumbar region: Secondary | ICD-10-CM | POA: Diagnosis not present

## 2015-09-21 DIAGNOSIS — M545 Low back pain: Secondary | ICD-10-CM | POA: Diagnosis not present

## 2015-09-21 DIAGNOSIS — I10 Essential (primary) hypertension: Secondary | ICD-10-CM | POA: Diagnosis not present

## 2015-09-21 DIAGNOSIS — M5134 Other intervertebral disc degeneration, thoracic region: Secondary | ICD-10-CM | POA: Diagnosis not present

## 2015-09-21 DIAGNOSIS — M47814 Spondylosis without myelopathy or radiculopathy, thoracic region: Secondary | ICD-10-CM | POA: Diagnosis not present

## 2015-09-21 DIAGNOSIS — M5136 Other intervertebral disc degeneration, lumbar region: Secondary | ICD-10-CM | POA: Diagnosis not present

## 2015-10-05 ENCOUNTER — Encounter: Payer: Self-pay | Admitting: Cardiovascular Disease

## 2015-10-05 ENCOUNTER — Ambulatory Visit (INDEPENDENT_AMBULATORY_CARE_PROVIDER_SITE_OTHER): Payer: Medicare Other | Admitting: Cardiovascular Disease

## 2015-10-05 VITALS — BP 116/72 | HR 61 | Ht 63.0 in | Wt 117.4 lb

## 2015-10-05 DIAGNOSIS — G4730 Sleep apnea, unspecified: Secondary | ICD-10-CM

## 2015-10-05 DIAGNOSIS — I471 Supraventricular tachycardia: Secondary | ICD-10-CM

## 2015-10-05 DIAGNOSIS — I483 Typical atrial flutter: Secondary | ICD-10-CM | POA: Diagnosis not present

## 2015-10-05 DIAGNOSIS — M5431 Sciatica, right side: Secondary | ICD-10-CM

## 2015-10-05 DIAGNOSIS — M419 Scoliosis, unspecified: Secondary | ICD-10-CM | POA: Diagnosis not present

## 2015-10-05 DIAGNOSIS — Z8669 Personal history of other diseases of the nervous system and sense organs: Secondary | ICD-10-CM

## 2015-10-05 DIAGNOSIS — I491 Atrial premature depolarization: Secondary | ICD-10-CM

## 2015-10-05 NOTE — Patient Instructions (Signed)
Your physician wants you to follow-up in: 1 year or sooner if needed. You will receive a reminder letter in the mail two months in advance. If you don't receive a letter, please call our office to schedule the follow-up appointment.   If you need a refill on your cardiac medications before your next appointment, please call your pharmacy.   

## 2015-10-06 DIAGNOSIS — Z8669 Personal history of other diseases of the nervous system and sense organs: Secondary | ICD-10-CM | POA: Insufficient documentation

## 2015-10-06 DIAGNOSIS — M5431 Sciatica, right side: Secondary | ICD-10-CM | POA: Insufficient documentation

## 2015-10-06 NOTE — Progress Notes (Signed)
Patient ID: Janet Nguyen, female   DOB: 06/16/1932, 82 y.o.   MRN: [id number]     HPI: Janet Nguyen is a 82 y.o. female  who presents for a 3 month followup cardiology evaluation.   [name] has a history of documented SVT and also has PACs and PVCs  treated with beta blocker therapy. In April 2014 I further titrated her beta blocker therapy after cardiac event monitor revealed several bursts of recurrent SVT up to 177 beats per minute in March.  In July after she had had 2 episodes of chest fluttering which each lasted over 30 minutes which he did take metoprolol tartrate with relief I recommended further titration of her Toprol to 75 mg in the morning and 50 mg at night and if necessary she could further titrate this to 75 twice a day.  She has a history of obstructive sleep apnea but despite multiple attempts at CPAP utilization she has not been able to tolerate this. She was referred to Dr. [name] and has a customized dental appliance with mandibular advancement with improvement in some of her symptomatology. Due concern that her teeth may be moving in more recently she has has not been using customized appliance daily but has been using her old non-customized mouthguard.  At times shen has awakened abruptly from a dream with her heart pounding and a sensation of hot flashes, gasping for breath.   She also states that her scoliosis is getting worse. She does have left-sided musculoskeletal type chest pain due to her spine angulation.  Beause of her significant scoliosis, she feels she must sleep on her back.   She has a history of GERD for which she has been taking Nexium.  She presents for evaluation.  I had scheduled her for an overnight oximetry to see if she is a candidate for supplemental oxygen at nighttime since she refuses to use CPAP and only very rarely uses her customized mouthpiece.  She does wear a mouthguard to reduce bruxism.  Oximetry study was performed overnight on  February 23/24.  Her mean oxygen saturation was 93.12%.  She spent 12 minutes and 16 seconds with O2 sat duration below 88% with the lowest O2 sat duration of 80%. I tried to set her up for supplemental oxygen at bedtime.  However, she has been denied for this on multiple occasions by her insurance/Medicare.  She feels that she is sleeping better.  She can only sleep in her back due to scoliosis.  Her left side is concave; her right side is convex.  She has had issues with labile blood pressure. She recently has had issues with lower back discomfort and sciatica leading to emergency room evaluation in November 2016.  She subsequent he saw Dr. [name]  For neurosurgical evaluation. Recently, she has been complaining of migraine headaches. She is scheduled to see a neurologist for these migraine headaches. She denies recent chest pain.  Her palpitations have been controlled with her current dose of Toprol-XL 75 g twice a day. She takes Xanax on an as-needed basis for anxiety.  She has been taking Nexium 20, no grams every other day for GERD. She states her scoliosis is getting worse. She presents for evaluation.   Past Medical History  Diagnosis Date  . Irregular heart beat 11/30/12    ECHO-EF 60-65%  . Celiac disease     treated by Dr. [name]  . GERD (gastroesophageal reflux disease)   . Osteoporosis   . Scoliosis   .  Intervertebral disc stenosis of neural canal of cervical region   . Sleep apnea 10/02/11 [facility]    Sleep study AHI -total sleep 10.3/hr  64.0/ hr during REM sleep.RDI 22.8/hr during total sleep 64.0/hr during REM sleep The lowest O2 sat during Non-REM and REM sleep was 86% and 88% respectively. 04/08/12 CPAP/BIPAP titration study [facility]  . Scoliosis   . Arrhythmia     History of SVT with documented PVC'S and  PAC'S  12/08/12 Nuc stress test normal LV EF 74%  Event Monitor  12/01/12-01/03/13    Past Surgical History  Procedure Laterality  Date  . Appendectomy      ruptured at age 3 and had surgery  . Cardiac catheterization  01/27/06    Allergies  Allergen Reactions  . Codeine Nausea Only  . Gluten Meal     Unknown    Current Outpatient Prescriptions  Medication Sig Dispense Refill  . acetaminophen (TYLENOL) 500 MG tablet Take 500 mg by mouth as needed for pain.    Marland Kitchen ALPRAZolam (XANAX) 0.25 MG tablet Take 0.25 mg by mouth as needed for anxiety.     Marland Kitchen BIOTIN PO Take 1 tablet by mouth 2 (two) times daily.    . calcium-vitamin D (OSCAL) 250-125 MG-UNIT per tablet Take 1 tablet by mouth daily.    . Carbamide Peroxide (EAR DROPS OT) Place 1 drop in ear(s) as needed (For itching ears).    . clidinium-chlordiazePOXIDE (LIBRAX) 5-2.5 MG per capsule Take 1 capsule by mouth daily as needed (upset stomach).     . esomeprazole (NEXIUM) 20 MG capsule Take 20 mg by mouth every other day.     Marland Kitchen FIBER COMPLETE PO Take 1 tablet by mouth daily.    . Loratadine 10 MG CAPS Take 1 capsule by mouth daily as needed.    . metoprolol succinate (TOPROL-XL) 50 MG 24 hr tablet Take 1 and 1/2 tablet (92m) by mouth twice daily. 270 tablet 1  . Multiple Vitamin (MULTIVITAMIN WITH MINERALS) TABS Take 2 tablets by mouth daily.     .Marland Kitchenzolpidem (AMBIEN CR) 6.25 MG CR tablet Take 6.25 mg by mouth at bedtime as needed for sleep.     No current facility-administered medications for this visit.    Socially she is divorced has 4 children 9 grandchildren. She does exercise. No tobacco use. She does occasional wine.  ROS General: Negative; No fevers, chills, or night sweats;  HEENT: Negative; No changes in vision or hearing, sinus congestion, difficulty swallowing Pulmonary: Negative; No cough, wheezing, shortness of breath, hemoptysis Cardiovascular: Positive for occasional chest wall pain and nocturnal palpitations GI: Negative; No nausea, vomiting, diarrhea, or abdominal pain GU: Negative; No dysuria, hematuria, or difficulty  voiding Musculoskeletal: Positive for significant scoliosis; fibromyalgia;  joint pain, or weakness Hematologic/Oncology: Negative; no easy bruising, bleeding Endocrine: Negative; no heat/cold intolerance; no diabetes Neuro: Negative; no changes in balance, headaches Skin: Negative; No rashes or skin lesions Psychiatric: Negative; No behavioral problems, depression Sleep: Positive for sleep apnea ; No snoring, daytime sleepiness, hypersomnolence, bruxism, restless legs, hypnogognic hallucinations, no cataplexy Other comprehensive 14 point system review is negative.  PE BP 116/72 mmHg  Pulse 61  Ht 5' 3"  (1.6 m)  Wt 117 lb 6.4 oz (53.252 kg)  BMI 20.80 kg/m2   Wt Readings from Last 3 Encounters:  10/05/15 117 lb 6.4 oz (53.252 kg)  05/03/15 122 lb 14.4 oz (55.747 kg)  02/20/15 123 lb 6.4 oz (55.974 kg)  General: Alert, oriented, no distress.  Skin: normal turgor, no rashes HEENT: Normocephalic, atraumatic. Pupils round and reactive; sclera anicteric;no lid lag.  Nose without nasal septal hypertrophy Mouth/Parynx benign; Mallinpatti scale 3  Neck: No JVD, no carotid bruits without carotid upstroke Lungs: clear to ausculatation and percussion; no wheezing or rales Chest wall: Mild tenderness to palpation in the lateral left pectoral region/rib. She does have significant scoliosis.  Mild tenderness over the sternal notch and there does appear to be a small cyst over the left lateral aspect. Heart: RRR, s1 s2 normal 6 systolic murmur; no ectopy on prolonged auscultation Abdomen: soft, nontender; no hepatosplenomehaly, BS+; abdominal aorta nontender and not dilated by palpation. Back: No CVA tenderness. Pulses 2+ Extremities: no clubbing cyanosis or edema, Homan's sign negative  Neurologic: grossly nonfocal Psychological: Normal affect and mood  ECG (independently read by me): normal sinus rhythm at 61 bpm..  No ST segment changes.  Normal intervals.  QTc interval 396 ms.  August  2014 ECG (independently read by me): Normal sinus rhythm at 63 bpm.  QRS couplets V1 V2.  No cigarette ST-T changes.  May 2016 ECG (independently read by me): Sinus bradycardia 59 bpm.  No ectopy.  February 2016 ECG (independently read by me): Sinus bradycardia 56 bpm.  Normal intervals.  Prior ECG (independently read by me): Normal sinus rhythm at 62 beats per minute.  QTc interval 411 milliseconds.  QRS complex V1, V2.  Normal intervals.  ECG (independently read by me): Normal sinus rhythm at 62 beats per minute. Normal intervals. QTc interval 399 ms.  Prior ECG of 07/13/2013: Sinus rhythm at 61 beats per minute. QTc interval 406 ms. PR interval normal at 168 ms.  LABS: BMP Latest Ref Rng 08/19/2015 12/04/2014 11/29/2012  Glucose 65 - 99 mg/dL 113(H) 106(H) -  BUN 6 - 20 mg/dL 8 12 -  Creatinine 0.44 - 1.00 mg/dL 0.86 0.86 0.79  Sodium 135 - 145 mmol/L 132(L) 138 -  Potassium 3.5 - 5.1 mmol/L 3.8 4.0 -  Chloride 101 - 111 mmol/L 98(L) 102 -  CO2 22 - 32 mmol/L 23 29 -  Calcium 8.9 - 10.3 mg/dL 9.8 9.9 -   Hepatic Function Latest Ref Rng 12/04/2014 05/11/2010  Total Protein 6.0 - 8.3 g/dL 7.4 7.2  Albumin 3.5 - 5.2 g/dL 4.5 3.3(L)  AST 0 - 37 U/L 37 18  ALT 0 - 35 U/L 24 18  Alk Phosphatase 39 - 117 U/L 53 66  Total Bilirubin 0.3 - 1.2 mg/dL 0.7 0.6   CBC Latest Ref Rng 08/19/2015 12/04/2014 11/29/2012  WBC 4.0 - 10.5 K/uL 15.0(H) 9.9 11.4(H)  Hemoglobin 12.0 - 15.0 g/dL 13.4 14.0 13.5  Hematocrit 36.0 - 46.0 % 39.5 41.1 38.2  Platelets 150 - 400 K/uL 231 323 264   Lab Results  Component Value Date   TSH 1.612 11/29/2012  Lipid Panel  No results found for: CHOL, TRIG, HDL, CHOLHDL, VLDL, LDLCALC, LDLDIRECT   RADIOLOGY: No results found.    ASSESSMENT AND PLAN: Ms. Call is an 82 year old female who has  a history of documented SVT and PACs/PVCs which have been controlled with beta blocker therapy. She has documented normal systolic function on her last echo in March 2014  with grade 1 diastolic dysfunction. She did have significant left atrial dilatation and mild dilatation of the right ventricle with moderate dilatation of the right atrium. She has mild/moderate pulmonary hypertension with PA pressures of 42 mm. Her daily palpitations have improved  on her current therapy of Toprol-XL 75 mg twice a day.  She states that she is sleeping significantly better with her new chin strap and old mouth guard.  She previously had a customized oral appliance by Dr. Oneal Grout, but due to discomfort stopped using this.  She never could tolerate any trial of CPAP therapy.  .  She has had some episodes of labile blood pressure.  However, her blood pressure today is stable on her current therapy.  She has been experiencing migraine headaches and will be undergoing a neurologic evaluation.  She recently had lumbar pain and was found to have sciatica for which she underwent neurosurgical evaluation with Dr. [name].  I reviewed her 08/19/2015.  Emergency room evaluation. She is status post cataract surgery which he tolerated well.  She is not having chest pain.  She denies PND, orthopnea.  Heart wise, she is stable.  I will see her in one year for reevaluation.  Time spent: 25 minutes Troy Sine, MD, [facility]  10/06/2015 12:36 PM

## 2015-10-10 DIAGNOSIS — K219 Gastro-esophageal reflux disease without esophagitis: Secondary | ICD-10-CM | POA: Diagnosis not present

## 2015-10-10 DIAGNOSIS — K5900 Constipation, unspecified: Secondary | ICD-10-CM | POA: Diagnosis not present

## 2015-10-15 DIAGNOSIS — M25462 Effusion, left knee: Secondary | ICD-10-CM | POA: Diagnosis not present

## 2015-10-15 DIAGNOSIS — M170 Bilateral primary osteoarthritis of knee: Secondary | ICD-10-CM | POA: Diagnosis not present

## 2015-10-15 DIAGNOSIS — M1712 Unilateral primary osteoarthritis, left knee: Secondary | ICD-10-CM | POA: Diagnosis not present

## 2015-10-29 DIAGNOSIS — J310 Chronic rhinitis: Secondary | ICD-10-CM | POA: Diagnosis not present

## 2015-10-29 DIAGNOSIS — G43119 Migraine with aura, intractable, without status migrainosus: Secondary | ICD-10-CM | POA: Diagnosis not present

## 2015-10-29 DIAGNOSIS — G4489 Other headache syndrome: Secondary | ICD-10-CM | POA: Diagnosis not present

## 2015-10-29 DIAGNOSIS — H538 Other visual disturbances: Secondary | ICD-10-CM | POA: Diagnosis not present

## 2015-10-30 ENCOUNTER — Other Ambulatory Visit: Payer: Self-pay | Admitting: Neurology

## 2015-10-30 DIAGNOSIS — G4489 Other headache syndrome: Secondary | ICD-10-CM

## 2015-11-03 DIAGNOSIS — M542 Cervicalgia: Secondary | ICD-10-CM | POA: Diagnosis not present

## 2015-11-03 DIAGNOSIS — M5136 Other intervertebral disc degeneration, lumbar region: Secondary | ICD-10-CM | POA: Diagnosis not present

## 2015-11-07 ENCOUNTER — Ambulatory Visit
Admission: RE | Admit: 2015-11-07 | Discharge: 2015-11-07 | Disposition: A | Discharge status: Home / Self Care | Payer: Medicare Other | Source: Ambulatory Visit | Attending: Neurology | Admitting: Neurology

## 2015-11-07 DIAGNOSIS — R51 Headache: Secondary | ICD-10-CM | POA: Diagnosis not present

## 2015-11-07 DIAGNOSIS — G4489 Other headache syndrome: Secondary | ICD-10-CM

## 2015-11-07 MED ORDER — GADOBENATE DIMEGLUMINE 529 MG/ML IV SOLN
10.0000 mL | Freq: Once | INTRAVENOUS | Status: AC | PRN
  Administered 2015-11-07: 10 mL via INTRAVENOUS

## 2015-12-05 DIAGNOSIS — Z124 Encounter for screening for malignant neoplasm of cervix: Secondary | ICD-10-CM | POA: Diagnosis not present

## 2015-12-05 DIAGNOSIS — N762 Acute vulvitis: Secondary | ICD-10-CM | POA: Diagnosis not present

## 2015-12-05 DIAGNOSIS — Z6821 Body mass index (BMI) 21.0-21.9, adult: Secondary | ICD-10-CM | POA: Diagnosis not present

## 2015-12-05 DIAGNOSIS — N390 Urinary tract infection, site not specified: Secondary | ICD-10-CM | POA: Diagnosis not present

## 2015-12-05 DIAGNOSIS — Z1231 Encounter for screening mammogram for malignant neoplasm of breast: Secondary | ICD-10-CM | POA: Diagnosis not present

## 2015-12-07 DIAGNOSIS — H40013 Open angle with borderline findings, low risk, bilateral: Secondary | ICD-10-CM | POA: Diagnosis not present

## 2015-12-07 DIAGNOSIS — R51 Headache: Secondary | ICD-10-CM | POA: Diagnosis not present

## 2015-12-12 DIAGNOSIS — R634 Abnormal weight loss: Secondary | ICD-10-CM | POA: Diagnosis not present

## 2015-12-12 DIAGNOSIS — R5383 Other fatigue: Secondary | ICD-10-CM | POA: Diagnosis not present

## 2015-12-12 DIAGNOSIS — R6889 Other general symptoms and signs: Secondary | ICD-10-CM | POA: Diagnosis not present

## 2015-12-14 ENCOUNTER — Other Ambulatory Visit: Payer: Self-pay | Admitting: Obstetrics & Gynecology

## 2015-12-14 DIAGNOSIS — R928 Other abnormal and inconclusive findings on diagnostic imaging of breast: Secondary | ICD-10-CM

## 2015-12-17 DIAGNOSIS — F419 Anxiety disorder, unspecified: Secondary | ICD-10-CM | POA: Diagnosis not present

## 2015-12-17 DIAGNOSIS — M542 Cervicalgia: Secondary | ICD-10-CM | POA: Diagnosis not present

## 2015-12-17 DIAGNOSIS — G4489 Other headache syndrome: Secondary | ICD-10-CM | POA: Diagnosis not present

## 2015-12-17 DIAGNOSIS — R42 Dizziness and giddiness: Secondary | ICD-10-CM | POA: Diagnosis not present

## 2015-12-17 DIAGNOSIS — R002 Palpitations: Secondary | ICD-10-CM | POA: Diagnosis not present

## 2015-12-17 DIAGNOSIS — M791 Myalgia: Secondary | ICD-10-CM | POA: Diagnosis not present

## 2015-12-17 DIAGNOSIS — G43109 Migraine with aura, not intractable, without status migrainosus: Secondary | ICD-10-CM | POA: Diagnosis not present

## 2015-12-18 ENCOUNTER — Encounter: Payer: Self-pay | Admitting: Cardiovascular Disease

## 2015-12-18 ENCOUNTER — Ambulatory Visit (INDEPENDENT_AMBULATORY_CARE_PROVIDER_SITE_OTHER): Payer: Medicare Other | Admitting: Cardiovascular Disease

## 2015-12-18 VITALS — BP 140/78 | HR 58 | Ht 63.0 in | Wt 117.0 lb

## 2015-12-18 DIAGNOSIS — G4730 Sleep apnea, unspecified: Secondary | ICD-10-CM | POA: Diagnosis not present

## 2015-12-18 DIAGNOSIS — R002 Palpitations: Secondary | ICD-10-CM | POA: Diagnosis not present

## 2015-12-18 DIAGNOSIS — M419 Scoliosis, unspecified: Secondary | ICD-10-CM | POA: Diagnosis not present

## 2015-12-18 DIAGNOSIS — R5382 Chronic fatigue, unspecified: Secondary | ICD-10-CM

## 2015-12-18 DIAGNOSIS — I471 Supraventricular tachycardia: Secondary | ICD-10-CM

## 2015-12-18 DIAGNOSIS — R0789 Other chest pain: Secondary | ICD-10-CM

## 2015-12-18 MED ORDER — METOPROLOL SUCCINATE ER 50 MG PO TB24: ORAL_TABLET | ORAL | Status: DC

## 2015-12-18 NOTE — Patient Instructions (Signed)
Your physician has recommended you make the following change in your medication:   1.) the toprol has been changed to 75 mg in the morning and 50 mg in the evening for 5 days then decrease to 50 mg twice a day.    If you notice breakthrough palpitations you can stay at 75 mg in the morning and 5 mg in the evening.  Your physician wants you to follow-up in: 6 months or sooner if needed. You will receive a reminder letter in the mail two months in advance. If you don't receive a letter, please call our office to schedule the follow-up appointment.  If you need a refill on your cardiac medications before your next appointment, please call your pharmacy.

## 2015-12-20 ENCOUNTER — Ambulatory Visit
Admission: RE | Admit: 2015-12-20 | Discharge: 2015-12-20 | Disposition: A | Discharge status: Home / Self Care | Payer: Medicare Other | Source: Ambulatory Visit | Attending: Obstetrics & Gynecology | Admitting: Obstetrics & Gynecology

## 2015-12-20 DIAGNOSIS — R922 Inconclusive mammogram: Secondary | ICD-10-CM | POA: Diagnosis not present

## 2015-12-20 DIAGNOSIS — R928 Other abnormal and inconclusive findings on diagnostic imaging of breast: Secondary | ICD-10-CM

## 2015-12-20 DIAGNOSIS — N6489 Other specified disorders of breast: Secondary | ICD-10-CM | POA: Diagnosis not present

## 2015-12-23 ENCOUNTER — Encounter: Payer: Self-pay | Admitting: Cardiovascular Disease

## 2015-12-23 DIAGNOSIS — R5383 Other fatigue: Secondary | ICD-10-CM | POA: Insufficient documentation

## 2015-12-23 NOTE — Progress Notes (Signed)
Patient ID: Janet Nguyen, female   DOB: [birth year], 82 y.o.   MRN: [id number]    Primary  M.D.: Dr. Lona Kettle  HPI: Janet Nguyen is a 82 y.o. female  who presents for a 3 month followup cardiology evaluation.   [name] has a history of documented SVT and also has PACs and PVCs  treated with beta blocker therapy. In April 2014 I further titrated her beta blocker therapy after cardiac event monitor revealed several bursts of recurrent SVT up to 177 beats per minute in March.  In July after she had had 2 episodes of chest fluttering which each lasted over 30 minutes which he did take metoprolol tartrate with relief I recommended further titration of her Toprol to 75 mg in the morning and 50 mg at night and if necessary she could further titrate this to 75 twice a day.  She has a history of obstructive sleep apnea but despite multiple attempts at CPAP utilization she has not been able to tolerate this. She was referred to Dr. [name] and has a customized dental appliance with mandibular advancement with improvement in some of her symptomatology. Due concern that her teeth may be moving in more recently she has has not been using customized appliance daily but has been using her old non-customized mouthguard.  At times shen has awakened abruptly from a dream with her heart pounding and a sensation of hot flashes, gasping for breath.   She also states that her scoliosis is getting worse. She does have left-sided musculoskeletal type chest pain due to her spine angulation.  Beause of her significant scoliosis, she feels she must sleep on her back.   She has a history of GERD for which she has been taking Nexium.  She presents for evaluation.  I had scheduled her for an overnight oximetry to see if she is a candidate for supplemental oxygen at nighttime since she refuses to use CPAP and only very rarely uses her customized mouthpiece.  She does wear a mouthguard to reduce bruxism.  Oximetry  study was performed overnight on February 23/24.  Her mean oxygen saturation was 93.12%.  She spent 12 minutes and 16 seconds with O2 sat duration below 88% with the lowest O2 sat duration of 80%. I tried to set her up for supplemental oxygen at bedtime.  However, she has been denied for this on multiple occasions by her insurance/Medicare.  She feels that she is sleeping better.  She can only sleep in her back due to scoliosis.  Her left side is concave; her right side is convex.  She has had issues with labile blood pressure. She has had issues with lower back discomfort and sciatica leading to emergency room evaluation in November 2016.  She saw Dr. [name] for neurosurgical evaluation. She has migraine headaches. She  Recently saw Dr. [name] for these migraine headaches.  She has been on Toprol-XL 75 mg twice a day for her palpitations and presently denies any awareness an extra heartbeats. She takes Xanax on an as-needed basis for anxiety.  She has been taking Nexium 20, no grams every other day for GERD. She states her scoliosis is getting worse.  Has been experiencing more fatigue.  She also notes some occasional left hand numbness.  In a mouth guard but no ureteral longer uses her mandibular advancement device and not tolerate CPAP therapy for her obstructive sleep apnea.  She presents for evaluation.   Past Medical History  Diagnosis Date  .  Irregular heart beat 11/30/12    ECHO-EF 60-65%  . Celiac disease     treated by Dr. [name]  . GERD (gastroesophageal reflux disease)   . Osteoporosis   . Scoliosis   . Intervertebral disc stenosis of neural canal of cervical region   . Sleep apnea 10/02/11 [facility]    Sleep study AHI -total sleep 10.3/hr  64.0/ hr during REM sleep.RDI 22.8/hr during total sleep 64.0/hr during REM sleep The lowest O2 sat during Non-REM and REM sleep was 86% and 88% respectively. 04/08/12 CPAP/BIPAP titration study [facility]  .  Scoliosis   . Arrhythmia     History of SVT with documented PVC'S and  PAC'S  12/08/12 Nuc stress test normal LV EF 74%  Event Monitor  12/01/12-01/03/13    Past Surgical History  Procedure Laterality Date  . Appendectomy      ruptured at age 70 and had surgery  . Cardiac catheterization  01/27/06    Allergies  Allergen Reactions  . Codeine Nausea Only  . Gluten Meal     Unknown    Current Outpatient Prescriptions  Medication Sig Dispense Refill  . acetaminophen (TYLENOL) 500 MG tablet Take 500 mg by mouth as needed for pain.    Marland Kitchen ALPRAZolam (XANAX) 0.25 MG tablet Take 0.25 mg by mouth as needed for anxiety.     . AMITIZA 8 MCG capsule Take 1 capsule by mouth at bedtime. Take 1 capsule by mouth at bedtime    . Biotin w/ Vitamins C & E (HAIR SKIN & NAILS GUMMIES) 1250-7.5-7.5 MCG-MG-UNT CHEW Chew 1 each by mouth daily.    . clidinium-chlordiazePOXIDE (LIBRAX) 5-2.5 MG per capsule Take 1 capsule by mouth daily as needed (upset stomach).     . esomeprazole (NEXIUM) 20 MG capsule Take 20 mg by mouth every other day.     Marland Kitchen FIBER COMPLETE PO Take 1 tablet by mouth daily.    . fluocinonide (LIDEX) 0.05 % external solution Apply 1 application topically as needed.    . metoprolol succinate (TOPROL-XL) 50 MG 24 hr tablet Take 1.5 tablets in the morning and 1 tablet in the evening for 5 days then take 1 tablet twice a day. 270 tablet 1  . Multiple Vitamin (MULTIVITAMIN WITH MINERALS) TABS Take 2 tablets by mouth daily.     Marland Kitchen Phenylephrine-Acetaminophen (EQL SINUS CONGESTION/PAIN DAY PO) Take by mouth as needed.    . zolpidem (AMBIEN CR) 6.25 MG CR tablet Take 6.25 mg by mouth at bedtime as needed for sleep.     No current facility-administered medications for this visit.    Socially she is divorced has 4 children 9 grandchildren. She does exercise. No tobacco use. She does occasional wine.  ROS General: Negative; No fevers, chills, or night sweats;  HEENT: Negative; No changes in vision  or hearing, sinus congestion, difficulty swallowing Pulmonary: Negative; No cough, wheezing, shortness of breath, hemoptysis Cardiovascular: Positive for occasional chest wall pain and nocturnal palpitations GI: Negative; No nausea, vomiting, diarrhea, or abdominal pain GU: Negative; No dysuria, hematuria, or difficulty voiding Musculoskeletal: Positive for significant scoliosis; fibromyalgia;  joint pain, or weakness Hematologic/Oncology: Negative; no easy bruising, bleeding Endocrine: Negative; no heat/cold intolerance; no diabetes Neuro: Negative; no changes in balance, headaches Skin: Negative; No rashes or skin lesions Psychiatric: Negative; No behavioral problems, depression Sleep: Positive for sleep apnea ; No snoring, daytime sleepiness, hypersomnolence, bruxism, restless legs, hypnogognic hallucinations, no cataplexy Other comprehensive 14 point system review  is negative.  PE BP 140/78 mmHg  Pulse 58  Ht _0  (1.6 m)  Wt 117 lb (53.071 kg)  BMI 20.73 kg/m2   Repeat BP by me: 130/78 supine; 126/80 standing.  Wt Readings from Last 3 Encounters:  12/18/15 117 lb (53.071 kg)  10/05/15 117 lb 6.4 oz (53.252 kg)  05/03/15 122 lb 14.4 oz (55.747 kg)   General: Alert, oriented, no distress.  Skin: normal turgor, no rashes HEENT: Normocephalic, atraumatic. Pupils round and reactive; sclera anicteric;no lid lag.  Nose without nasal septal hypertrophy Mouth/Parynx benign; Mallinpatti scale 3  Neck: No JVD, no carotid bruits without carotid upstroke Lungs: clear to ausculatation and percussion; no wheezing or rales Chest wall: Mild tenderness to palpation in the lateral left pectoral region/rib. She does have significant scoliosis.  Mild tenderness over the sternal notch and there does appear to be a small cyst over the left lateral aspect. Heart: RRR, s1 s2 normal 6 systolic murmur; no ectopy on prolonged auscultation Abdomen: soft, nontender; no hepatosplenomehaly, BS+; abdominal  aorta nontender and not dilated by palpation. Back: No CVA tenderness. Pulses 2+ Extremities: no clubbing cyanosis or edema, Homan's sign negative  Neurologic: grossly nonfocal Psychological: Normal affect and mood  ECG (independently read by me): normal sinus rhythm at 61 bpm..  No ST segment changes.  Normal intervals.  QTc interval 396 ms.  August 2014 ECG (independently read by me): Normal sinus rhythm at 63 bpm.  QRS couplets V1 V2.  No cigarette ST-T changes.  May 2016 ECG (independently read by me): Sinus bradycardia 59 bpm.  No ectopy.  February 2016 ECG (independently read by me): Sinus bradycardia 56 bpm.  Normal intervals.  Prior ECG (independently read by me): Normal sinus rhythm at 62 beats per minute.  QTc interval 411 milliseconds.  QRS complex V1, V2.  Normal intervals.  ECG (independently read by me): Normal sinus rhythm at 62 beats per minute. Normal intervals. QTc interval 399 ms.  Prior ECG of 07/13/2013: Sinus rhythm at 61 beats per minute. QTc interval 406 ms. PR interval normal at 168 ms.  LABS: BMP Latest Ref Rng 08/19/2015 12/04/2014 11/29/2012  Glucose 65 - 99 mg/dL 113(H) 106(H) -  BUN 6 - 20 mg/dL 8 12 -  Creatinine 0.44 - 1.00 mg/dL 0.86 0.86 0.79  Sodium 135 - 145 mmol/L 132(L) 138 -  Potassium 3.5 - 5.1 mmol/L 3.8 4.0 -  Chloride 101 - 111 mmol/L 98(L) 102 -  CO2 22 - 32 mmol/L 23 29 -  Calcium 8.9 - 10.3 mg/dL 9.8 9.9 -   Hepatic Function Latest Ref Rng 12/04/2014 05/11/2010  Total Protein 6.0 - 8.3 g/dL 7.4 7.2  Albumin 3.5 - 5.2 g/dL 4.5 3.3(L)  AST 0 - 37 U/L 37 18  ALT 0 - 35 U/L 24 18  Alk Phosphatase 39 - 117 U/L 53 66  Total Bilirubin 0.3 - 1.2 mg/dL 0.7 0.6   CBC Latest Ref Rng 08/19/2015 12/04/2014 11/29/2012  WBC 4.0 - 10.5 K/uL 15.0(H) 9.9 11.4(H)  Hemoglobin 12.0 - 15.0 g/dL 13.4 14.0 13.5  Hematocrit 36.0 - 46.0 % 39.5 41.1 38.2  Platelets 150 - 400 K/uL 231 323 264   Lab Results  Component Value Date   TSH 1.612 11/29/2012  Lipid  Panel  No results found for: CHOL, TRIG, HDL, CHOLHDL, VLDL, LDLCALC, LDLDIRECT   RADIOLOGY: No results found.    ASSESSMENT AND PLAN: Ms. Jezewski is an 82 year old female who has a history of documented  SVT and PACs/PVCs which have been controlled with beta blocker therapy. She has documented normal systolic function on her echo in March 2014 with grade 1 diastolic dysfunction and had  significant left atrial dilatation and mild dilatation of the right ventricle with moderate dilatation of the right atrium. She has mild/moderate pulmonary hypertension with PA pressures of 42 mm. Her daily palpitations have improved on her current therapy of Toprol-XL 75 mg twice a day.  She states that she is sleeping significantly better with her new chin strap and old mouth guard.  She previously had a customized oral appliance by Dr. Oneal Grout, but due to discomfort stopped using this.  She never could tolerate any trial of CPAP therapy. She had experienced migraine headaches and recently underwent neurologic evaluation. She has also experienced left-sided chest discomfort which may be related to her significant scoliosis and potential neuropathy causing intermittent left arm, hand numbness..  Recently, she has experienced increasing fatigability.  She has been using her chinstrap with mouth guard for sleep and states that as long as she takes zolpidem she is sleeping well.  I suspect some of her fatigue may be contributed by her medication and in particular the current dose of Toprol-XL which is 150 mg daily.  I have suggested a very slight reduction in her dose and she will change this to 75 mg in the morning and 50 mg in the evening for 5-7 days.  If she does not notice any significant recurrent palpitations she can try reducing this back down to 50 mg twice a day.  She will contact us if recurrent symptoms develop.  Her blood pressure today is stable on her current regimen.  She is not having significant GERD on  Nexium.  I will see her in 6 months for reevaluation or sooner if problems arise.  Time spent: 25 minutes  Troy Sine, MD, [facility]  12/23/2015 11:06 AM

## 2015-12-31 ENCOUNTER — Other Ambulatory Visit: Payer: Self-pay | Admitting: Neurology

## 2015-12-31 DIAGNOSIS — R42 Dizziness and giddiness: Secondary | ICD-10-CM | POA: Diagnosis not present

## 2015-12-31 DIAGNOSIS — F411 Generalized anxiety disorder: Secondary | ICD-10-CM | POA: Diagnosis not present

## 2015-12-31 DIAGNOSIS — G43119 Migraine with aura, intractable, without status migrainosus: Secondary | ICD-10-CM | POA: Diagnosis not present

## 2015-12-31 DIAGNOSIS — I6521 Occlusion and stenosis of right carotid artery: Secondary | ICD-10-CM

## 2015-12-31 DIAGNOSIS — F419 Anxiety disorder, unspecified: Secondary | ICD-10-CM | POA: Diagnosis not present

## 2015-12-31 DIAGNOSIS — R002 Palpitations: Secondary | ICD-10-CM | POA: Diagnosis not present

## 2015-12-31 DIAGNOSIS — R202 Paresthesia of skin: Secondary | ICD-10-CM | POA: Diagnosis not present

## 2016-01-01 DIAGNOSIS — R1032 Left lower quadrant pain: Secondary | ICD-10-CM | POA: Diagnosis not present

## 2016-01-07 ENCOUNTER — Other Ambulatory Visit: Payer: Medicare Other

## 2016-01-07 ENCOUNTER — Other Ambulatory Visit: Payer: Self-pay | Admitting: Cardiovascular Disease

## 2016-01-09 ENCOUNTER — Telehealth: Payer: Self-pay | Admitting: *Deleted

## 2016-01-09 NOTE — Telephone Encounter (Signed)
Dr [name] please address this refill since this patient is no longer using her CPAP therapy. Should she obtain refills from PCP?

## 2016-01-10 ENCOUNTER — Ambulatory Visit
Admission: RE | Admit: 2016-01-10 | Discharge: 2016-01-10 | Disposition: A | Discharge status: Home / Self Care | Payer: Medicare Other | Source: Ambulatory Visit | Attending: Neurology | Admitting: Neurology

## 2016-01-10 DIAGNOSIS — I6521 Occlusion and stenosis of right carotid artery: Secondary | ICD-10-CM

## 2016-01-10 DIAGNOSIS — I6529 Occlusion and stenosis of unspecified carotid artery: Secondary | ICD-10-CM | POA: Diagnosis not present

## 2016-01-11 ENCOUNTER — Other Ambulatory Visit: Payer: Self-pay | Admitting: *Deleted

## 2016-01-11 NOTE — Telephone Encounter (Signed)
Patient is calling to see when Dr. [name] will ok her rx for Ambien. She states she is almost out of mediation and would like it called in before the weekend comes.  She would like a call back from his nurse to know the status by the end of the day if he will refill her medication.

## 2016-01-12 ENCOUNTER — Other Ambulatory Visit: Payer: Self-pay | Admitting: Cardiovascular Disease

## 2016-01-14 NOTE — Telephone Encounter (Signed)
Rx(s) sent to pharmacy electronically.  

## 2016-01-14 NOTE — Telephone Encounter (Signed)
Janet Nguyen  01/11/2016  Refill  MRN:  [id number]   Description: 82 year old female  Provider: Paulla Dolly  Department: [facility] [address]       Call Documentation      Junius Argyle Shoffner at 01/11/2016 10:22 AM     Status: Signed       Expand All Collapse All   Patient is calling to see when Dr. [name] will ok her rx for Ambien. She states she is almost out of mediation and would like it called in before the weekend comes.  She would like a call back from his nurse to know the status by the end of the day if he will refill her medication.              Encounter MyChart Messages     No messages in this encounter     Pending      Disp Refills Start End    zolpidem (AMBIEN CR) 6.25 MG CR tablet 30 tablet  01/11/2016     Sig - Route:  Take 1 tablet (6.25 mg total) by mouth at bedtime as needed for sleep. - Oral    DAW:  No      Visit Pharmacy     [facility] [address] [place], [address]     Contacts       Type Contact Phone    01/11/2016 10:22 AM Phone (Incoming) Janet Nguyen, Janet Nguyen (Self) [phone] (H)      Routing History     Priority Sent On From To Message Type     01/11/2016 4:48 PM Paulla Dolly [facility] Clinical Pool Rx Request     01/11/2016 10:26 AM Terin C Shoffner Troy Sine, MD Rx Request     Vennie Homans     BEEN SENT TO DR. Gorham

## 2016-01-15 NOTE — Telephone Encounter (Signed)
Ok to renew previous dose

## 2016-01-16 ENCOUNTER — Other Ambulatory Visit: Payer: Self-pay | Admitting: *Deleted

## 2016-01-16 NOTE — Telephone Encounter (Signed)
Called pharmacy to refill patient's ambien. Per pharmacist this has already been refilled by someone else. (MD)

## 2016-01-17 NOTE — Telephone Encounter (Signed)
Patient's Zolpidem 5 mg has been denied. Per Dr. [name] she was changed to 6.25 mg PRN sleep. Patient would rather take the 5 mg tablets. She has appointment with Dr [name] next week and will discuss it then. I asked if she has enough medication to last until her appointment and she replied yes.

## 2016-01-21 ENCOUNTER — Telehealth: Payer: Self-pay | Admitting: Cardiovascular Disease

## 2016-01-21 ENCOUNTER — Ambulatory Visit (INDEPENDENT_AMBULATORY_CARE_PROVIDER_SITE_OTHER): Payer: Medicare Other | Admitting: Cardiovascular Disease

## 2016-01-21 ENCOUNTER — Ambulatory Visit: Payer: Medicare Other | Admitting: Cardiovascular Disease

## 2016-01-21 VITALS — BP 174/86 | HR 70 | Ht 62.0 in | Wt 115.0 lb

## 2016-01-21 DIAGNOSIS — I6521 Occlusion and stenosis of right carotid artery: Secondary | ICD-10-CM

## 2016-01-21 DIAGNOSIS — I10 Essential (primary) hypertension: Secondary | ICD-10-CM

## 2016-01-21 MED ORDER — ZOLPIDEM TARTRATE 5 MG PO TABS: 5.0000 mg | ORAL_TABLET | Freq: Every evening | ORAL | Status: DC | PRN

## 2016-01-21 MED ORDER — AMLODIPINE BESYLATE 2.5 MG PO TABS: 2.5000 mg | ORAL_TABLET | Freq: Every day | ORAL | Status: DC

## 2016-01-21 NOTE — Telephone Encounter (Signed)
Closed encounter °

## 2016-01-21 NOTE — Patient Instructions (Addendum)
Your physician wants you to follow-up in: 6 months or sooner if needed. You will receive a reminder letter in the mail two months in advance. If you don't receive a letter, please call our office to schedule the follow-up appointment.  If you need a refill on your cardiac medications before your next appointment, please call your pharmacy.  Your physician has recommended you make the following change in your medication: start new prescription for amlodipine 2.5 mg. This has been sent to your pharmacy.

## 2016-01-23 ENCOUNTER — Encounter: Payer: Self-pay | Admitting: Cardiovascular Disease

## 2016-01-23 NOTE — Progress Notes (Signed)
Patient ID: Janet Nguyen, female   DOB: 18-May-1932, 81 y.o.   MRN: [id number]     Primary  M.D.: Dr. Lona Kettle  HPI: Janet Nguyen is a 82 y.o. female who is seen as an add-on for followup cardiology evaluation.   Janet Nguyen has a history of documented SVT and also has PACs and PVCs  treated with beta blocker therapy. In April 2014 I further titrated her beta blocker therapy after cardiac event monitor revealed several bursts of recurrent SVT up to 177 beats per minute in March.  In July after Janet Nguyen had had 2 episodes of chest fluttering which each lasted over 30 minutes which he did take metoprolol tartrate with relief I recommended further titration of her Toprol to 75 mg in the morning and 50 mg at night and if necessary Janet Nguyen could further titrate this to 75 twice a day.  Janet Nguyen has a history of obstructive sleep apnea but despite multiple attempts at CPAP utilization Janet Nguyen has not been able to tolerate this. Janet Nguyen was referred to Dr. [name] and has a customized dental appliance with mandibular advancement with improvement in some of her symptomatology. Due concern that her teeth may be moving in more recently Janet Nguyen has has not been using customized appliance daily but has been using her old non-customized mouthguard.  At times shen has awakened abruptly from a dream with her heart pounding and a sensation of hot flashes, gasping for breath.   Janet Nguyen also states that her scoliosis is getting worse. Janet Nguyen does have left-sided musculoskeletal type chest pain due to her spine angulation.  Beause of her significant scoliosis, Janet Nguyen feels Janet Nguyen must sleep on her back.   Janet Nguyen has a history of GERD for which Janet Nguyen has been taking Nexium.  Janet Nguyen presents for evaluation.  I had scheduled her for an overnight oximetry to see if Janet Nguyen is a candidate for supplemental oxygen at nighttime since Janet Nguyen refuses to use CPAP and only very rarely uses her customized mouthpiece.  Janet Nguyen does wear a mouthguard to reduce bruxism.   Oximetry study was performed overnight on February 23/24.  Her mean oxygen saturation was 93.12%.  Janet Nguyen spent 12 minutes and 16 seconds with O2 sat duration below 88% with the lowest O2 sat duration of 80%. I tried to set her up for supplemental oxygen at bedtime.  However, Janet Nguyen has been denied for this on multiple occasions by her insurance/Medicare.  Janet Nguyen feels that Janet Nguyen is sleeping better.  Janet Nguyen can only sleep in her back due to scoliosis.  Her left side is concave; her right side is convex.  Janet Nguyen has had issues with labile blood pressure. Janet Nguyen has had issues with lower back discomfort and sciatica leading to emergency room evaluation in November 2016.  Janet Nguyen saw Dr. [name] for neurosurgical evaluation. Janet Nguyen has migraine headaches. Janet Nguyen  Recently saw Dr. [name] for these migraine headaches.  Janet Nguyen has been on Toprol-XL 75 mg twice a day for her palpitations and presently denies any awareness an extra heartbeats. Janet Nguyen takes Xanax on an as-needed basis for anxiety.  Janet Nguyen has been taking Nexium 20, no grams every other day for GERD. Janet Nguyen states her scoliosis is getting worse.  Has been experiencing more fatigue.  Janet Nguyen also notes some occasional left hand numbness.  In a mouth guard but no ureteral longer uses her mandibular advancement device and not tolerate CPAP therapy for her obstructive sleep apnea.    I last saw her one month ago at which time  Janet Nguyen was complaining of significant increased fatigability.  Janet Nguyen had experienced left-sided chest discomfort which most likely was related to her significant scoliosis the potential neuropathy causing intermittent left arm and hand numbness.  I slightly reduced her Toprol-XL which Janet Nguyen had been taking 150 mg daily and a 75 twice a day regimen to 75 and milligrams in the morning and 50 mg with ultimate plan to decrease this to 50 twice a day.  Janet Nguyen states when Janet Nguyen reduce this, Janet Nguyen began to notice more optical migraines and resume taking the higher dose Toprol at 75 twice a day with  improvement.  Janet Nguyen has seen Dr. [name] for her paresthesias.  Janet Nguyen admits to significant anxiety.  Janet Nguyen had requested zolpidem for sleep initiation and maintenance.  In the past, we had tried 6.25 slow-release version to help with sleep maintenance, but due to cost issues.  Janet Nguyen would prefer the 5 mg sleep initiation dose.  Janet Nguyen has continued to notice blood pressure lability presents today for evaluation.  Past Medical History  Diagnosis Date  . Irregular heart beat 11/30/12    ECHO-EF 60-65%  . Celiac disease     treated by Dr. [name]  . GERD (gastroesophageal reflux disease)   . Osteoporosis   . Scoliosis   . Intervertebral disc stenosis of neural canal of cervical region   . Sleep apnea 10/02/11 [facility]    Sleep study AHI -total sleep 10.3/hr  64.0/ hr during REM sleep.RDI 22.8/hr during total sleep 64.0/hr during REM sleep The lowest O2 sat during Non-REM and REM sleep was 86% and 88% respectively. 04/08/12 CPAP/BIPAP titration study [facility]  . Scoliosis   . Arrhythmia     History of SVT with documented PVC'S and  PAC'S  12/08/12 Nuc stress test normal LV EF 74%  Event Monitor  12/01/12-01/03/13    Past Surgical History  Procedure Laterality Date  . Appendectomy      ruptured at age 9 and had surgery  . Cardiac catheterization  01/27/06    Allergies  Allergen Reactions  . Codeine Nausea Only  . Gluten Meal     Unknown    Current Outpatient Prescriptions  Medication Sig Dispense Refill  . acetaminophen (TYLENOL) 500 MG tablet Take 500 mg by mouth as needed for pain.    Marland Kitchen ALPRAZolam (XANAX) 0.25 MG tablet Take 0.25 mg by mouth as needed for anxiety.     . AMITIZA 8 MCG capsule Take 1 capsule by mouth at bedtime. Take 1 capsule by mouth at bedtime    . Biotin w/ Vitamins C & E (HAIR SKIN & NAILS GUMMIES) 1250-7.5-7.5 MCG-MG-UNT CHEW Chew 1 each by mouth daily.    . clidinium-chlordiazePOXIDE (LIBRAX) 5-2.5 MG per capsule Take 1 capsule by  mouth daily as needed (upset stomach).     . esomeprazole (NEXIUM) 20 MG capsule Take 20 mg by mouth every other day.     Marland Kitchen FIBER COMPLETE PO Take 1 tablet by mouth daily.    . fluocinonide (LIDEX) 0.05 % external solution Apply 1 application topically as needed.    . metoprolol succinate (TOPROL-XL) 50 MG 24 hr tablet Take 75 mg by mouth 2 (two) times daily. Take with or immediately following a meal.    . Multiple Vitamin (MULTIVITAMIN WITH MINERALS) TABS Take 2 tablets by mouth daily.     Marland Kitchen Phenylephrine-Acetaminophen (EQL SINUS CONGESTION/PAIN DAY PO) Take by mouth as needed.    Marland Kitchen amLODipine (NORVASC) 2.5 MG tablet  Take 1 tablet (2.5 mg total) by mouth daily. 30 tablet 6  . zolpidem (AMBIEN) 5 MG tablet Take 1 tablet (5 mg total) by mouth at bedtime as needed for sleep. 90 tablet 0   No current facility-administered medications for this visit.    Socially Janet Nguyen is divorced has 4 children 9 grandchildren. Janet Nguyen does exercise. No tobacco use. Janet Nguyen does occasional wine.  ROS General: Negative; No fevers, chills, or night sweats;  HEENT: Negative; No changes in vision or hearing, sinus congestion, difficulty swallowing Pulmonary: Negative; No cough, wheezing, shortness of breath, hemoptysis Cardiovascular: Positive for occasional chest wall pain and nocturnal palpitations GI: Negative; No nausea, vomiting, diarrhea, or abdominal pain GU: Negative; No dysuria, hematuria, or difficulty voiding Musculoskeletal: Positive for significant scoliosis; fibromyalgia;  joint pain, or weakness Hematologic/Oncology: Negative; no easy bruising, bleeding Endocrine: Negative; no heat/cold intolerance; no diabetes Neuro: Negative; no changes in balance, headaches Skin: Negative; No rashes or skin lesions Psychiatric: Negative; No behavioral problems, depression Sleep: Positive for sleep apnea ; No snoring, daytime sleepiness, hypersomnolence, bruxism, restless legs, hypnogognic hallucinations, no  cataplexy Other comprehensive 14 point system review is negative.  PE BP 174/86 mmHg  Pulse 70  Ht 5' 2"  (1.575 m)  Wt 115 lb (52.164 kg)  BMI 21.03 kg/m2   Repeat BP by me: 178/84 in the right arm and 176/84 in the left arm  Wt Readings from Last 3 Encounters:  01/21/16 115 lb (52.164 kg)  12/18/15 117 lb (53.071 kg)  10/05/15 117 lb 6.4 oz (53.252 kg)   General: Alert, oriented, no distress.  Skin: normal turgor, no rashes HEENT: Normocephalic, atraumatic. Pupils round and reactive; sclera anicteric;no lid lag.  Nose without nasal septal hypertrophy Mouth/Parynx benign; Mallinpatti scale 3  Neck: No JVD, no carotid bruits without carotid upstroke Lungs: clear to ausculatation and percussion; no wheezing or rales Chest wall: Mild tenderness to palpation in the lateral left pectoral region/rib. Janet Nguyen does have significant scoliosis.  Mild tenderness over the sternal notch and there does appear to be a small cyst over the left lateral aspect. Heart: RRR, s1 s2 normal 1/6 systolic murmur; no ectopy on prolonged auscultation Abdomen: soft, nontender; no hepatosplenomehaly, BS+; abdominal aorta nontender and not dilated by palpation. Back: No CVA tenderness. Pulses 2+ Extremities: no clubbing cyanosis or edema, Homan's sign negative  Neurologic: grossly nonfocal Psychological: Normal affect and mood  ECG (independently read by me): Normal sinus rhythm at 70 bpm.  No ectopy.  PR interval 158 ms and QTc interval 414 ms.  March 2017 ECG (independently read by me): normal sinus rhythm at 61 bpm..  No ST segment changes.  Normal intervals.  QTc interval 396 ms.  August 2014 ECG (independently read by me): Normal sinus rhythm at 63 bpm.  QRS couplets V1 V2.  No cigarette ST-T changes.  May 2016 ECG (independently read by me): Sinus bradycardia 59 bpm.  No ectopy.  February 2016 ECG (independently read by me): Sinus bradycardia 56 bpm.  Normal intervals.  Prior ECG (independently read  by me): Normal sinus rhythm at 62 beats per minute.  QTc interval 411 milliseconds.  QRS complex V1, V2.  Normal intervals.  ECG (independently read by me): Normal sinus rhythm at 62 beats per minute. Normal intervals. QTc interval 399 ms.  Prior ECG of 07/13/2013: Sinus rhythm at 61 beats per minute. QTc interval 406 ms. PR interval normal at 168 ms.  LABS: BMP Latest Ref Rng 08/19/2015 12/04/2014 11/29/2012  Glucose 65 - 99  mg/dL 113(H) 106(H) -  BUN 6 - 20 mg/dL 8 12 -  Creatinine 0.44 - 1.00 mg/dL 0.86 0.86 0.79  Sodium 135 - 145 mmol/L 132(L) 138 -  Potassium 3.5 - 5.1 mmol/L 3.8 4.0 -  Chloride 101 - 111 mmol/L 98(L) 102 -  CO2 22 - 32 mmol/L 23 29 -  Calcium 8.9 - 10.3 mg/dL 9.8 9.9 -   Hepatic Function Latest Ref Rng 12/04/2014 05/11/2010  Total Protein 6.0 - 8.3 g/dL 7.4 7.2  Albumin 3.5 - 5.2 g/dL 4.5 3.3(L)  AST 0 - 37 U/L 37 18  ALT 0 - 35 U/L 24 18  Alk Phosphatase 39 - 117 U/L 53 66  Total Bilirubin 0.3 - 1.2 mg/dL 0.7 0.6   CBC Latest Ref Rng 08/19/2015 12/04/2014 11/29/2012  WBC 4.0 - 10.5 K/uL 15.0(H) 9.9 11.4(H)  Hemoglobin 12.0 - 15.0 g/dL 13.4 14.0 13.5  Hematocrit 36.0 - 46.0 % 39.5 41.1 38.2  Platelets 150 - 400 K/uL 231 323 264   Lab Results  Component Value Date   TSH 1.612 11/29/2012  Lipid Panel  No results found for: CHOL, TRIG, HDL, CHOLHDL, VLDL, LDLCALC, LDLDIRECT   RADIOLOGY: No results found.    ASSESSMENT AND PLAN: Janet Nguyen is an 82 year old female who has a history of documented SVT and PACs/PVCs which have been controlled with beta blocker therapy. Janet Nguyen has documented normal systolic function on her echo in March 2014 with grade 1 diastolic dysfunction and had  significant left atrial dilatation and mild dilatation of the right ventricle with moderate dilatation of the right atrium. Janet Nguyen has mild/moderate pulmonary hypertension with PA pressures of 42 mm. Her daily palpitations have improved on  Toprol-XL 75 mg twice a day.  Janet Nguyen is sleeping  significantly better with her new chin strap and old mouth guard.  Janet Nguyen previously had a customized oral appliance by Dr. Oneal Grout, but due to discomfort stopped using this.  Janet Nguyen never could tolerate any trial of CPAP therapy. Janet Nguyen had experienced migraine headaches and recently underwent neurologic evaluation. Janet Nguyen has also experienced left-sided chest discomfort which may be related to her significant scoliosis and potential neuropathy causing intermittent left arm, hand numbness..  Recently, Janet Nguyen has experienced increasing fatigability.  When Janet Nguyen reduced her Toprol dose, Janet Nguyen felt an increase recurrence of optical migraines and these have been controlled with resumption of her previous 75 mg twice a day regimen of long-acting Toprol.  Her blood pressure today is elevated.  Janet Nguyen does not like to take medicines.  I recommended at least initiation of amlodipine at 2.5 mg to see if this may improve some of her blood pressure lability.  Although Janet Nguyen has issues with sleep maintenance.  Due to cost issues Janet Nguyen prefers short acting zolpiden and I have renewed 5 mg of zolpidem today.  Janet Nguyen will monitor her blood pressure.  Her ECG is stable.  Janet Nguyen's not having any anginal type symptoms.  Janet Nguyen has atypical musculoskeletal chest pain, probably come interpreted by her scoliosis induced body habitus.  I will see her in 6 months for cardiology reevaluation.  Time spent: 25 minutes  Troy Sine, MD, [facility]  01/23/2016 6:23 PM

## 2016-01-24 DIAGNOSIS — T148 Other injury of unspecified body region: Secondary | ICD-10-CM | POA: Diagnosis not present

## 2016-01-24 DIAGNOSIS — M542 Cervicalgia: Secondary | ICD-10-CM | POA: Diagnosis not present

## 2016-02-08 DIAGNOSIS — R201 Hypoesthesia of skin: Secondary | ICD-10-CM | POA: Diagnosis not present

## 2016-02-08 DIAGNOSIS — G43109 Migraine with aura, not intractable, without status migrainosus: Secondary | ICD-10-CM | POA: Diagnosis not present

## 2016-02-08 DIAGNOSIS — H539 Unspecified visual disturbance: Secondary | ICD-10-CM | POA: Diagnosis not present

## 2016-02-08 DIAGNOSIS — F411 Generalized anxiety disorder: Secondary | ICD-10-CM | POA: Diagnosis not present

## 2016-02-08 DIAGNOSIS — R9082 White matter disease, unspecified: Secondary | ICD-10-CM | POA: Diagnosis not present

## 2016-02-11 ENCOUNTER — Other Ambulatory Visit: Payer: Self-pay | Admitting: Gastroenterology

## 2016-02-11 DIAGNOSIS — K900 Celiac disease: Secondary | ICD-10-CM | POA: Diagnosis not present

## 2016-02-11 DIAGNOSIS — K625 Hemorrhage of anus and rectum: Secondary | ICD-10-CM | POA: Diagnosis not present

## 2016-02-11 DIAGNOSIS — K5900 Constipation, unspecified: Secondary | ICD-10-CM | POA: Diagnosis not present

## 2016-02-11 DIAGNOSIS — R1013 Epigastric pain: Secondary | ICD-10-CM | POA: Diagnosis not present

## 2016-02-11 DIAGNOSIS — K559 Vascular disorder of intestine, unspecified: Secondary | ICD-10-CM

## 2016-02-11 DIAGNOSIS — R197 Diarrhea, unspecified: Secondary | ICD-10-CM | POA: Diagnosis not present

## 2016-02-12 ENCOUNTER — Ambulatory Visit
Admission: RE | Admit: 2016-02-12 | Discharge: 2016-02-12 | Disposition: A | Discharge status: Home / Self Care | Payer: Medicare Other | Source: Ambulatory Visit | Attending: Gastroenterology | Admitting: Gastroenterology

## 2016-02-12 DIAGNOSIS — K559 Vascular disorder of intestine, unspecified: Secondary | ICD-10-CM

## 2016-02-12 DIAGNOSIS — R1011 Right upper quadrant pain: Secondary | ICD-10-CM | POA: Diagnosis not present

## 2016-02-12 MED ORDER — IOPAMIDOL (ISOVUE-300) INJECTION 61%
100.0000 mL | Freq: Once | INTRAVENOUS | Status: AC | PRN
  Administered 2016-02-12: 100 mL via INTRAVENOUS

## 2016-02-27 DIAGNOSIS — K559 Vascular disorder of intestine, unspecified: Secondary | ICD-10-CM | POA: Diagnosis not present

## 2016-02-29 DIAGNOSIS — R935 Abnormal findings on diagnostic imaging of other abdominal regions, including retroperitoneum: Secondary | ICD-10-CM | POA: Diagnosis not present

## 2016-02-29 DIAGNOSIS — M419 Scoliosis, unspecified: Secondary | ICD-10-CM | POA: Diagnosis not present

## 2016-02-29 DIAGNOSIS — G479 Sleep disorder, unspecified: Secondary | ICD-10-CM | POA: Diagnosis not present

## 2016-02-29 DIAGNOSIS — M545 Low back pain: Secondary | ICD-10-CM | POA: Diagnosis not present

## 2016-02-29 DIAGNOSIS — R490 Dysphonia: Secondary | ICD-10-CM | POA: Diagnosis not present

## 2016-03-06 DIAGNOSIS — K2940 Chronic atrophic gastritis without bleeding: Secondary | ICD-10-CM | POA: Diagnosis not present

## 2016-03-06 DIAGNOSIS — K648 Other hemorrhoids: Secondary | ICD-10-CM | POA: Diagnosis not present

## 2016-03-06 DIAGNOSIS — K900 Celiac disease: Secondary | ICD-10-CM | POA: Diagnosis not present

## 2016-03-06 DIAGNOSIS — Z86010 Personal history of colonic polyps: Secondary | ICD-10-CM | POA: Diagnosis not present

## 2016-03-06 DIAGNOSIS — K55039 Acute (reversible) ischemia of large intestine, extent unspecified: Secondary | ICD-10-CM | POA: Diagnosis not present

## 2016-03-06 DIAGNOSIS — K2931 Chronic superficial gastritis with bleeding: Secondary | ICD-10-CM | POA: Diagnosis not present

## 2016-03-06 DIAGNOSIS — K5730 Diverticulosis of large intestine without perforation or abscess without bleeding: Secondary | ICD-10-CM | POA: Diagnosis not present

## 2016-03-06 DIAGNOSIS — D120 Benign neoplasm of cecum: Secondary | ICD-10-CM | POA: Diagnosis not present

## 2016-03-06 DIAGNOSIS — K641 Second degree hemorrhoids: Secondary | ICD-10-CM | POA: Diagnosis not present

## 2016-03-06 DIAGNOSIS — K219 Gastro-esophageal reflux disease without esophagitis: Secondary | ICD-10-CM | POA: Diagnosis not present

## 2016-03-06 DIAGNOSIS — K559 Vascular disorder of intestine, unspecified: Secondary | ICD-10-CM | POA: Diagnosis not present

## 2016-03-10 ENCOUNTER — Other Ambulatory Visit: Payer: Self-pay | Admitting: Cardiovascular Disease

## 2016-03-10 MED ORDER — METOPROLOL SUCCINATE ER 50 MG PO TB24: ORAL_TABLET | ORAL | Status: DC

## 2016-03-10 NOTE — Telephone Encounter (Signed)
E-sent to pharmacy Patient aware

## 2016-03-10 NOTE — Telephone Encounter (Signed)
°*  STAT* If patient is at the pharmacy, call can be transferred to refill team.   1. Which medications need to be refilled? (please list name of each medication and dose if known) Metoprolol-pt needs new prescription,her dose have changed.she takes 75 mg in the A.M. And 75 mg in the P.M 2 Which pharmacy/location (including street and city if local pharmacy) is medication to be sent to?[facility]-[phone]  3. Do they need a 30 day or 90 day supply? 90 days supply 90 days supply

## 2016-03-19 DIAGNOSIS — R1012 Left upper quadrant pain: Secondary | ICD-10-CM | POA: Diagnosis not present

## 2016-03-20 DIAGNOSIS — G43809 Other migraine, not intractable, without status migrainosus: Secondary | ICD-10-CM | POA: Diagnosis not present

## 2016-03-20 DIAGNOSIS — R51 Headache: Secondary | ICD-10-CM | POA: Diagnosis not present

## 2016-03-20 DIAGNOSIS — H53143 Visual discomfort, bilateral: Secondary | ICD-10-CM | POA: Diagnosis not present

## 2016-03-20 DIAGNOSIS — H04123 Dry eye syndrome of bilateral lacrimal glands: Secondary | ICD-10-CM | POA: Diagnosis not present

## 2016-04-15 DIAGNOSIS — R490 Dysphonia: Secondary | ICD-10-CM | POA: Diagnosis not present

## 2016-04-15 DIAGNOSIS — K219 Gastro-esophageal reflux disease without esophagitis: Secondary | ICD-10-CM | POA: Diagnosis not present

## 2016-04-15 DIAGNOSIS — G4733 Obstructive sleep apnea (adult) (pediatric): Secondary | ICD-10-CM | POA: Diagnosis not present

## 2016-04-15 DIAGNOSIS — J310 Chronic rhinitis: Secondary | ICD-10-CM | POA: Diagnosis not present

## 2016-04-16 DIAGNOSIS — K458 Other specified abdominal hernia without obstruction or gangrene: Secondary | ICD-10-CM | POA: Diagnosis not present

## 2016-04-18 ENCOUNTER — Telehealth: Payer: Self-pay | Admitting: Cardiovascular Disease

## 2016-04-18 NOTE — Telephone Encounter (Signed)
New message    Pt wants an earlier appt with Dr. [name]. When offered one the pt declined. Please call.

## 2016-04-18 NOTE — Telephone Encounter (Signed)
New Message  Pt calling to speak w/ RN- wanted to discuss recent symptoms w/ RN- would not specify on phone. Please call back and discuss.

## 2016-04-18 NOTE — Telephone Encounter (Signed)
Patient is wanting a sooner appointment with Dr. [name], due to increase in palpitations. Patient wanted message sent to [facility] his nurse, and wants her to call her back today. Offered patient an appointment with PA next week at [facility], but patient declined. Will forward to Brady to see if she can help.

## 2016-04-23 DIAGNOSIS — K641 Second degree hemorrhoids: Secondary | ICD-10-CM | POA: Diagnosis not present

## 2016-04-23 DIAGNOSIS — K219 Gastro-esophageal reflux disease without esophagitis: Secondary | ICD-10-CM | POA: Diagnosis not present

## 2016-04-29 DIAGNOSIS — M419 Scoliosis, unspecified: Secondary | ICD-10-CM | POA: Diagnosis not present

## 2016-04-30 DIAGNOSIS — M5136 Other intervertebral disc degeneration, lumbar region: Secondary | ICD-10-CM | POA: Diagnosis not present

## 2016-04-30 DIAGNOSIS — M542 Cervicalgia: Secondary | ICD-10-CM | POA: Diagnosis not present

## 2016-04-30 DIAGNOSIS — G894 Chronic pain syndrome: Secondary | ICD-10-CM | POA: Diagnosis not present

## 2016-05-01 ENCOUNTER — Other Ambulatory Visit: Payer: Self-pay | Admitting: Cardiovascular Disease

## 2016-05-01 DIAGNOSIS — H04123 Dry eye syndrome of bilateral lacrimal glands: Secondary | ICD-10-CM | POA: Diagnosis not present

## 2016-05-01 DIAGNOSIS — H53143 Visual discomfort, bilateral: Secondary | ICD-10-CM | POA: Diagnosis not present

## 2016-05-01 DIAGNOSIS — G43809 Other migraine, not intractable, without status migrainosus: Secondary | ICD-10-CM | POA: Diagnosis not present

## 2016-05-01 DIAGNOSIS — R51 Headache: Secondary | ICD-10-CM | POA: Diagnosis not present

## 2016-05-07 DIAGNOSIS — K641 Second degree hemorrhoids: Secondary | ICD-10-CM | POA: Diagnosis not present

## 2016-05-07 DIAGNOSIS — K5900 Constipation, unspecified: Secondary | ICD-10-CM | POA: Diagnosis not present

## 2016-05-08 NOTE — Telephone Encounter (Signed)
There are no sooner appointments. She can be placed on a wait list.

## 2016-05-21 ENCOUNTER — Other Ambulatory Visit: Payer: Self-pay | Admitting: *Deleted

## 2016-05-21 DIAGNOSIS — D692 Other nonthrombocytopenic purpura: Secondary | ICD-10-CM | POA: Diagnosis not present

## 2016-05-21 DIAGNOSIS — D485 Neoplasm of uncertain behavior of skin: Secondary | ICD-10-CM | POA: Diagnosis not present

## 2016-05-21 DIAGNOSIS — L821 Other seborrheic keratosis: Secondary | ICD-10-CM | POA: Diagnosis not present

## 2016-05-21 NOTE — Telephone Encounter (Signed)
Patient called and requested that this be refilled. She stated that Dr [name] prescribed the 5 mg for her and she feels that the dose is appropriate. Her pcp had her on the 6.25 mg and she has some of those on hand, but she states that they are too strong.

## 2016-05-21 NOTE — Telephone Encounter (Signed)
Message routed to MD to advise on ambien refill

## 2016-05-28 DIAGNOSIS — K641 Second degree hemorrhoids: Secondary | ICD-10-CM | POA: Diagnosis not present

## 2016-06-10 DIAGNOSIS — R509 Fever, unspecified: Secondary | ICD-10-CM | POA: Diagnosis not present

## 2016-06-11 ENCOUNTER — Other Ambulatory Visit: Payer: Self-pay | Admitting: Cardiovascular Disease

## 2016-06-18 DIAGNOSIS — M542 Cervicalgia: Secondary | ICD-10-CM | POA: Diagnosis not present

## 2016-06-18 DIAGNOSIS — H539 Unspecified visual disturbance: Secondary | ICD-10-CM | POA: Diagnosis not present

## 2016-06-18 DIAGNOSIS — H538 Other visual disturbances: Secondary | ICD-10-CM | POA: Diagnosis not present

## 2016-06-18 DIAGNOSIS — G43109 Migraine with aura, not intractable, without status migrainosus: Secondary | ICD-10-CM | POA: Diagnosis not present

## 2016-06-18 DIAGNOSIS — M791 Myalgia: Secondary | ICD-10-CM | POA: Diagnosis not present

## 2016-06-19 DIAGNOSIS — Z23 Encounter for immunization: Secondary | ICD-10-CM | POA: Diagnosis not present

## 2016-06-26 ENCOUNTER — Encounter: Payer: Self-pay | Admitting: Cardiovascular Disease

## 2016-06-26 ENCOUNTER — Ambulatory Visit (INDEPENDENT_AMBULATORY_CARE_PROVIDER_SITE_OTHER): Payer: Medicare Other | Admitting: Cardiovascular Disease

## 2016-06-26 ENCOUNTER — Telehealth: Payer: Self-pay | Admitting: Cardiovascular Disease

## 2016-06-26 VITALS — BP 94/60 | HR 62 | Ht 62.0 in | Wt 112.0 lb

## 2016-06-26 DIAGNOSIS — G4730 Sleep apnea, unspecified: Secondary | ICD-10-CM | POA: Diagnosis not present

## 2016-06-26 DIAGNOSIS — I10 Essential (primary) hypertension: Secondary | ICD-10-CM | POA: Diagnosis not present

## 2016-06-26 DIAGNOSIS — I6521 Occlusion and stenosis of right carotid artery: Secondary | ICD-10-CM

## 2016-06-26 DIAGNOSIS — I471 Supraventricular tachycardia: Secondary | ICD-10-CM | POA: Diagnosis not present

## 2016-06-26 DIAGNOSIS — M419 Scoliosis, unspecified: Secondary | ICD-10-CM

## 2016-06-26 DIAGNOSIS — R002 Palpitations: Secondary | ICD-10-CM

## 2016-06-26 NOTE — Telephone Encounter (Signed)
Spoke with Dr [name] regarding Aspirin for patient. Dr [name] advised that patient may go back on ASA 81 mg daily but not necessary. If patient has any issues with GI  Bleeding as previously experienced, then she should stop it immediately.  Called patient and gave her Dr Evette Georges advice. She said she would rather not take it due to GI bleeding issues. She said thank you for talking to Dr [name].

## 2016-06-26 NOTE — Telephone Encounter (Signed)
New message   Pt states she just saw doc [name] today and asked the doc if she can take a low dose aspirin and she never got an answer. She states that they got to talking about something else. Please call.

## 2016-06-26 NOTE — Patient Instructions (Signed)
Medication Instructions:  Continue current medications  Labwork: None Ordered  Testing/Procedures: None Ordered  Follow-Up: Your physician wants you to follow-up in: 6 Months. You will receive a reminder letter in the mail two months in advance. If you don't receive a letter, please call our office to schedule the follow-up appointment.   Any Other Special Instructions Will Be Listed Below (If Applicable).   If you need a refill on your cardiac medications before your next appointment, please call your pharmacy.

## 2016-06-28 NOTE — Progress Notes (Signed)
Patient ID: Janet Nguyen, female   DOB: 1932/05/09, 82 y.o.   MRN: [id number]     Primary  M.D.: Dr. Lona Kettle  HPI: Janet Nguyen is a 82 y.o. female who presents for a 5 month followup cardiology evaluation.   Janet Nguyen has a history of documented SVT and also has PACs and PVCs  treated with beta blocker therapy. In April 2014 I further titrated her beta blocker therapy after cardiac event monitor revealed several bursts of recurrent SVT up to 177 beats per minute in March.  In July after she had had 2 episodes of chest fluttering which each lasted over 30 minutes which he did take metoprolol tartrate with relief I recommended further titration of her Toprol to 75 mg in the morning and 50 mg at night and if necessary she could further titrate this to 75 twice a day.  She has a history of obstructive sleep apnea but despite multiple attempts at CPAP utilization she has not been able to tolerate this. She was referred to Dr. [name] and has a customized dental appliance with mandibular advancement with improvement in some of her symptomatology. Due concern that her teeth may be moving in more recently she has has not been using customized appliance daily but has been using her old non-customized mouthguard.  At times shen has awakened abruptly from a dream with her heart pounding and a sensation of hot flashes, gasping for breath.   She also states that her scoliosis is getting worse. She does have left-sided musculoskeletal type chest pain due to her spine angulation.  Beause of her significant scoliosis, she feels she must sleep on her back.   She has a history of GERD for which she has been taking Nexium.  She presents for evaluation.  I had scheduled her for an overnight oximetry to see if she is a candidate for supplemental oxygen at nighttime since she refuses to use CPAP and only very rarely uses her customized mouthpiece.  She does wear a mouthguard to reduce bruxism.  Oximetry  study was performed overnight on February 23/24.  Her mean oxygen saturation was 93.12%.  She spent 12 minutes and 16 seconds with O2 sat duration below 88% with the lowest O2 sat duration of 80%. I tried to set her up for supplemental oxygen at bedtime.  However, she has been denied for this on multiple occasions by her insurance/Medicare.  She feels that she is sleeping better.  She can only sleep in her back due to scoliosis.  Her left side is concave; her right side is convex.  She has had issues with labile blood pressure. She has had issues with lower back discomfort and sciatica leading to emergency room evaluation in November 2016.  She saw Dr. [name] for neurosurgical evaluation. She has migraine headaches. She  Recently saw Dr. [name] for these migraine headaches.  She has been on Toprol-XL 75 mg twice a day for her palpitations and presently denies any awareness an extra heartbeats. She takes Xanax on an as-needed basis for anxiety.  She has been taking Nexium 20, no grams every other day for GERD. She states her scoliosis is getting worse.  Has been experiencing more fatigue.  She also notes some occasional left hand numbness.  In a mouth guard but no ureteral longer uses her mandibular advancement device and not tolerate CPAP therapy for her obstructive sleep apnea.    When I saw her earlier this year she was complaining of  significant increased fatigability.  She had experienced left-sided chest discomfort which most likely was related to her significant scoliosis the potential neuropathy causing intermittent left arm and hand numbness.  I slightly reduced her Toprol-XL which she had been taking 150 mg daily and a 75 twice a day regimen to 75 and milligrams in the morning and 50 mg with ultimate plan to decrease this to 50 twice a day.  She states when she reduce this, she began to notice more optical migraines and resume taking the higher dose Toprol at 75 twice a day with improvement.  She has  seen Dr. [name] for her paresthesias.  She admits to significant anxiety.  She had requested zolpidem for sleep initiation and maintenance.  In the past, we had tried 6.25 slow-release version to help with sleep maintenance, but due to cost issues preferred the 5 mg sleep initiation dose.  Since I last saw her, she states her palpitations are better.  She does experience optical migraine headaches.  She is being seen by neurologist, Dr. Melton Alar  She continues to have issues with her scoliosis causing discomfort in her neck and arm due to her distortion.  He tells me she underwent endoscopy and colonoscopy as well as banding of hemorrhoids.  She had a benign polyp removed.  She has issues with spastic colon.  Her sleep is better with zolpiden 5 mg She presents for follow-up evaluation.  Past Medical History:  Diagnosis Date  . Arrhythmia    History of SVT with documented PVC'S and  PAC'S  12/08/12 Nuc stress test normal LV EF 74%  Event Monitor  12/01/12-01/03/13  . Celiac disease    treated by Dr. [name]  . GERD (gastroesophageal reflux disease)   . Intervertebral disc stenosis of neural canal of cervical region   . Irregular heart beat 11/30/12   ECHO-EF 60-65%  . Osteoporosis   . Scoliosis   . Scoliosis   . Sleep apnea 10/02/11 [facility]   Sleep study AHI -total sleep 10.3/hr  64.0/ hr during REM sleep.RDI 22.8/hr during total sleep 64.0/hr during REM sleep The lowest O2 sat during Non-REM and REM sleep was 86% and 88% respectively. 04/08/12 CPAP/BIPAP titration study [facility]    Past Surgical History:  Procedure Laterality Date  . APPENDECTOMY     ruptured at age 38 and had surgery  . CARDIAC CATHETERIZATION  01/27/06    Allergies  Allergen Reactions  . Codeine Nausea Only  . Gluten Meal     Unknown    Current Outpatient Prescriptions  Medication Sig Dispense Refill  . acetaminophen (TYLENOL) 500 MG tablet Take 500 mg by mouth as needed for  pain.    Marland Kitchen ALPRAZolam (XANAX) 0.25 MG tablet Take 0.25 mg by mouth as needed for anxiety.     . AMITIZA 8 MCG capsule Take 1 capsule by mouth at bedtime. Take 1 capsule by mouth at bedtime    . amLODipine (NORVASC) 2.5 MG tablet Take 1 tablet (2.5 mg total) by mouth daily. 30 tablet 6  . Biotin w/ Vitamins C & E (HAIR SKIN & NAILS GUMMIES) 1250-7.5-7.5 MCG-MG-UNT CHEW Chew 1 each by mouth daily.    Marland Kitchen esomeprazole (NEXIUM) 20 MG capsule Take 20 mg by mouth every other day.     Marland Kitchen FIBER COMPLETE PO Take 1 tablet by mouth daily.    . fluocinonide (LIDEX) 0.05 % external solution Apply 1 application topically as needed.    . metoprolol  succinate (TOPROL-XL) 50 MG 24 hr tablet Take 1 and 1/2 tablets ( total 75 mg) by mouth twice  daily 270 tablet 3  . Multiple Vitamin (MULTIVITAMIN WITH MINERALS) TABS Take 2 tablets by mouth daily.     Marland Kitchen Phenylephrine-Acetaminophen (EQL SINUS CONGESTION/PAIN DAY PO) Take by mouth as needed.    . zolpidem (AMBIEN) 5 MG tablet TAKE ONE TABLET BY MOUTH AT BEDTIME AS NEEDED FOR  SLEEP. 90 tablet 0   No current facility-administered medications for this visit.     Socially she is divorced has 4 children 9 grandchildren. She does exercise. No tobacco use. She does occasional wine.  ROS General: Negative; No fevers, chills, or night sweats;  HEENT: Negative; No changes in vision or hearing, sinus congestion, difficulty swallowing Pulmonary: Negative; No cough, wheezing, shortness of breath, hemoptysis Cardiovascular: Positive for occasional chest wall pain and nocturnal palpitations GI: Negative; No nausea, vomiting, diarrhea, or abdominal pain GU: Negative; No dysuria, hematuria, or difficulty voiding Musculoskeletal: Positive for significant scoliosis; fibromyalgia;  joint pain, or weakness Hematologic/Oncology: Negative; no easy bruising, bleeding Endocrine: Negative; no heat/cold intolerance; no diabetes Neuro: Negative; no changes in balance, headaches Skin:  Negative; No rashes or skin lesions Psychiatric: Negative; No behavioral problems, depression Sleep: Positive for sleep apnea ; No snoring, daytime sleepiness, hypersomnolence, bruxism, restless legs, hypnogognic hallucinations, no cataplexy Other comprehensive 14 point system review is negative.  PE BP 94/60 (BP Location: Left Arm, Patient Position: Sitting, Cuff Size: Small)   Pulse 62   Ht 5' 2"  (1.575 m)   Wt 112 lb (50.8 kg)   BMI 20.49 kg/m    Repeat BP by me: 138/70 supine and 140/72 standing  Wt Readings from Last 3 Encounters:  06/26/16 112 lb (50.8 kg)  01/21/16 115 lb (52.2 kg)  12/18/15 117 lb (53.1 kg)   General: Alert, oriented, no distress.  Skin: normal turgor, no rashes HEENT: Normocephalic, atraumatic. Pupils round and reactive; sclera anicteric;no lid lag.  Nose without nasal septal hypertrophy Mouth/Parynx benign; Mallinpatti scale 3  Neck: No JVD, no carotid bruits without carotid upstroke Lungs: clear to ausculatation and percussion; no wheezing or rales Chest wall: Mild tenderness to palpation in the lateral left pectoral region/rib. She does have significant scoliosis.  Mild tenderness over the sternal notch and there does appear to be a small cyst over the left lateral aspect. Heart: RRR, s1 s2 normal 1/6 systolic murmur; no ectopy on prolonged auscultation Abdomen: soft, nontender; no hepatosplenomehaly, BS+; abdominal aorta nontender and not dilated by palpation. Back: No CVA tenderness. Pulses 2+ Extremities: no clubbing cyanosis or edema, Homan's sign negative  Neurologic: grossly nonfocal Psychological: Normal affect and mood  ECG (independently read by me): Normal sinus rhythm at 62 bpm.  QS V1 and V2.  Normal intervals.  April 2017 ECG (independently read by me): Normal sinus rhythm at 70 bpm.  No ectopy.  PR interval 158 ms and QTc interval 414 ms.  March 2017 ECG (independently read by me): normal sinus rhythm at 61 bpm..  No ST segment  changes.  Normal intervals.  QTc interval 396 ms.  August 2014 ECG (independently read by me): Normal sinus rhythm at 63 bpm.  QRS couplets V1 V2.  No cigarette ST-T changes.  May 2016 ECG (independently read by me): Sinus bradycardia 59 bpm.  No ectopy.  February 2016 ECG (independently read by me): Sinus bradycardia 56 bpm.  Normal intervals.  Prior ECG (independently read by me): Normal sinus rhythm at 62 beats  per minute.  QTc interval 411 milliseconds.  QRS complex V1, V2.  Normal intervals.  ECG (independently read by me): Normal sinus rhythm at 62 beats per minute. Normal intervals. QTc interval 399 ms.  Prior ECG of 07/13/2013: Sinus rhythm at 61 beats per minute. QTc interval 406 ms. PR interval normal at 168 ms.  LABS: BMP Latest Ref Rng & Units 08/19/2015 12/04/2014 11/29/2012  Glucose 65 - 99 mg/dL 113(H) 106(H) -  BUN 6 - 20 mg/dL 8 12 -  Creatinine 0.44 - 1.00 mg/dL 0.86 0.86 0.79  Sodium 135 - 145 mmol/L 132(L) 138 -  Potassium 3.5 - 5.1 mmol/L 3.8 4.0 -  Chloride 101 - 111 mmol/L 98(L) 102 -  CO2 22 - 32 mmol/L 23 29 -  Calcium 8.9 - 10.3 mg/dL 9.8 9.9 -   Hepatic Function Latest Ref Rng & Units 12/04/2014 05/11/2010  Total Protein 6.0 - 8.3 g/dL 7.4 7.2  Albumin 3.5 - 5.2 g/dL 4.5 3.3(L)  AST 0 - 37 U/L 37 18  ALT 0 - 35 U/L 24 18  Alk Phosphatase 39 - 117 U/L 53 66  Total Bilirubin 0.3 - 1.2 mg/dL 0.7 0.6   CBC Latest Ref Rng & Units 08/19/2015 12/04/2014 11/29/2012  WBC 4.0 - 10.5 K/uL 15.0(H) 9.9 11.4(H)  Hemoglobin 12.0 - 15.0 g/dL 13.4 14.0 13.5  Hematocrit 36.0 - 46.0 % 39.5 41.1 38.2  Platelets 150 - 400 K/uL 231 323 264   Lab Results  Component Value Date   TSH 1.612 11/29/2012  Lipid Panel  No results found for: CHOL, TRIG, HDL, CHOLHDL, VLDL, LDLCALC, LDLDIRECT   RADIOLOGY: No results found.    ASSESSMENT AND PLAN: Janet Nguyen is an 82 year old female who has a history of documented SVT and PACs/PVCs which have been controlled with beta blocker  therapy. She has documented normal systolic function on her echo in March 2014 with grade 1 diastolic dysfunction and had  significant left atrial dilatation and mild dilatation of the right ventricle with moderate dilatation of the right atrium. She has mild/moderate pulmonary hypertension with PA pressures of 42 mm. Her daily palpitations have improved on  Toprol-XL 75 mg twice a day.  She is sleeping significantly better with her new chin strap and old mouth guard.  She previously had a customized oral appliance by Dr. Oneal Grout, but due to discomfort stopped using this.  She never could tolerate any trial of CPAP therapy. She had experienced migraine headaches and recently underwent neurologic evaluation. She has also experienced left-sided chest discomfort which may be related to her significant scoliosis and potential neuropathy causing intermittent left arm, hand numbness..  This year when she had increased fatigability and her Toprol dose is slightly reduced, she felt an increase recurrence of optical migraines and these have been controlled with resumption of her previous 75 mg twice a day regimen of long-acting Toprol.  When I last saw her, her blood pressure was elevated and I initiated amlodipine at 2.5 mg.  Her blood pressure today is normal.  When taken by me, and she is tolerating low-dose amlodipine at 2.5 mg in addition to the Toprol-XL 75 mg twice a day.  Her sleep is significantly improved and she has been taking zolpidem 5 mg daily.  She could not tolerate CPAP therapy.  She had undergone carotid duplex imaging which did not reveal significant plaque or significant stenosis.  She antegrade flow in the vertebral arteries.  At that time there was a suggestion  of a 15 mm blood pressure differential.  Prior evaluation, however, has shown bilateral equal blood pressure determinations.  I suggested that she return in one year for follow-up evaluation; however, she prefers to be followed at six-month  intervals.  Time spent: 25 minutes  Troy Sine, MD, [facility]  06/28/2016 5:02 PM

## 2016-07-29 ENCOUNTER — Telehealth: Payer: Self-pay | Admitting: *Deleted

## 2016-07-29 DIAGNOSIS — M5136 Other intervertebral disc degeneration, lumbar region: Secondary | ICD-10-CM | POA: Diagnosis not present

## 2016-07-29 DIAGNOSIS — G894 Chronic pain syndrome: Secondary | ICD-10-CM | POA: Diagnosis not present

## 2016-07-29 DIAGNOSIS — M542 Cervicalgia: Secondary | ICD-10-CM | POA: Diagnosis not present

## 2016-07-29 NOTE — Telephone Encounter (Signed)
Patient walked into the office with a question but left before I could get to the lobby. Spoke with pt, she is having an increase in her palpitations. She is taking the metoprolol succ 75 mg twice daily. She also has metoprolol tartrate 25 mg as needed, she notices the palpitations usually first thing in the morning or while resting in the chair. She wonders how often and how many tartrate she can take in the day. She does not have a way to check her bp at home. She would like to know dr Evette Georges option. Will forward to dr [name] to review and advise.

## 2016-08-06 DIAGNOSIS — N952 Postmenopausal atrophic vaginitis: Secondary | ICD-10-CM | POA: Diagnosis not present

## 2016-08-06 DIAGNOSIS — N760 Acute vaginitis: Secondary | ICD-10-CM | POA: Diagnosis not present

## 2016-08-06 DIAGNOSIS — N390 Urinary tract infection, site not specified: Secondary | ICD-10-CM | POA: Diagnosis not present

## 2016-08-13 DIAGNOSIS — K5900 Constipation, unspecified: Secondary | ICD-10-CM | POA: Diagnosis not present

## 2016-08-13 DIAGNOSIS — K219 Gastro-esophageal reflux disease without esophagitis: Secondary | ICD-10-CM | POA: Diagnosis not present

## 2016-08-20 DIAGNOSIS — K219 Gastro-esophageal reflux disease without esophagitis: Secondary | ICD-10-CM | POA: Insufficient documentation

## 2016-08-20 DIAGNOSIS — R070 Pain in throat: Secondary | ICD-10-CM | POA: Insufficient documentation

## 2016-08-20 DIAGNOSIS — G4733 Obstructive sleep apnea (adult) (pediatric): Secondary | ICD-10-CM | POA: Insufficient documentation

## 2016-08-20 DIAGNOSIS — J310 Chronic rhinitis: Secondary | ICD-10-CM | POA: Insufficient documentation

## 2016-08-20 DIAGNOSIS — R042 Hemoptysis: Secondary | ICD-10-CM | POA: Insufficient documentation

## 2016-08-25 ENCOUNTER — Other Ambulatory Visit: Payer: Self-pay | Admitting: Cardiovascular Disease

## 2016-08-25 NOTE — Telephone Encounter (Signed)
REFILL 

## 2016-08-28 ENCOUNTER — Telehealth: Payer: Self-pay

## 2016-08-28 MED ORDER — ZOLPIDEM TARTRATE 5 MG PO TABS: 5.0000 mg | ORAL_TABLET | Freq: Every evening | ORAL | 0 refills | Status: DC | PRN

## 2016-08-28 NOTE — Telephone Encounter (Signed)
ambien #[phone] refill called into [facility] per Mariann Laster

## 2016-09-01 DIAGNOSIS — G479 Sleep disorder, unspecified: Secondary | ICD-10-CM | POA: Diagnosis not present

## 2016-09-01 DIAGNOSIS — R0981 Nasal congestion: Secondary | ICD-10-CM | POA: Diagnosis not present

## 2016-09-01 DIAGNOSIS — M179 Osteoarthritis of knee, unspecified: Secondary | ICD-10-CM | POA: Diagnosis not present

## 2016-09-03 DIAGNOSIS — H26493 Other secondary cataract, bilateral: Secondary | ICD-10-CM | POA: Diagnosis not present

## 2016-09-03 DIAGNOSIS — H40013 Open angle with borderline findings, low risk, bilateral: Secondary | ICD-10-CM | POA: Diagnosis not present

## 2016-09-03 DIAGNOSIS — H353132 Nonexudative age-related macular degeneration, bilateral, intermediate dry stage: Secondary | ICD-10-CM | POA: Diagnosis not present

## 2016-09-10 ENCOUNTER — Telehealth: Payer: Self-pay | Admitting: Family Medicine

## 2016-09-10 DIAGNOSIS — K5900 Constipation, unspecified: Secondary | ICD-10-CM | POA: Diagnosis not present

## 2016-09-10 DIAGNOSIS — K219 Gastro-esophageal reflux disease without esophagitis: Secondary | ICD-10-CM | POA: Diagnosis not present

## 2016-09-10 NOTE — Telephone Encounter (Signed)
Pt would like to know if you guys would be willing to take her on has a new medicare pt ?

## 2016-09-10 NOTE — Telephone Encounter (Signed)
No I am not accepting new patients

## 2016-09-12 NOTE — Telephone Encounter (Signed)
Patient would like to know if Dr. Ronnald Ramp would take her on as a patient.  She is looking for an MD.

## 2016-10-24 DIAGNOSIS — J029 Acute pharyngitis, unspecified: Secondary | ICD-10-CM | POA: Diagnosis not present

## 2016-10-24 DIAGNOSIS — R0981 Nasal congestion: Secondary | ICD-10-CM | POA: Diagnosis not present

## 2016-10-24 DIAGNOSIS — Z8719 Personal history of other diseases of the digestive system: Secondary | ICD-10-CM | POA: Diagnosis not present

## 2016-10-24 DIAGNOSIS — R12 Heartburn: Secondary | ICD-10-CM | POA: Diagnosis not present

## 2016-10-29 ENCOUNTER — Telehealth: Payer: Self-pay | Admitting: Cardiovascular Disease

## 2016-10-29 NOTE — Telephone Encounter (Signed)
Spoke to patient concerned with hoarseness, occaisonal bloody sputum occasional. Patient has went to GI , ENT QUESTION having sleep apnea-  Patient concerned about possible issue with carotid and some numbness in arms.  patient wanted to discuss with dr [name] - appointment not available until march 2018. Appointment schedule with extender for feb 2018 while dr [name] is in office for consultation if needed.

## 2016-10-29 NOTE — Telephone Encounter (Signed)
No answer . Phone rang x 7.

## 2016-10-29 NOTE — Telephone Encounter (Signed)
New message       Pt is having problems with her CPAP.  Please call

## 2016-11-04 DIAGNOSIS — N390 Urinary tract infection, site not specified: Secondary | ICD-10-CM | POA: Diagnosis not present

## 2016-11-13 ENCOUNTER — Encounter: Payer: Self-pay | Admitting: Cardiology

## 2016-11-13 ENCOUNTER — Ambulatory Visit (INDEPENDENT_AMBULATORY_CARE_PROVIDER_SITE_OTHER): Payer: Medicare Other | Admitting: Cardiology

## 2016-11-13 DIAGNOSIS — I471 Supraventricular tachycardia: Secondary | ICD-10-CM | POA: Diagnosis not present

## 2016-11-13 DIAGNOSIS — G4730 Sleep apnea, unspecified: Secondary | ICD-10-CM

## 2016-11-13 NOTE — Progress Notes (Signed)
11/13/2016 Janet Nguyen   [birth year]  [id number]  Primary Physician Melinda Crutch, MD Primary Cardiologist: Dr [name]  HPI:  83 y/o female followed by Dr [name] with a history of sleep apnea and PSVT. She has had an extensive work up for sleep apnea. She has been intolerant to C-pap. She was wearing a mouth guard but had to stop secondary to some trouble with her teeth. She is currently using a chin strap. She is in the office today to discuss her medications. She says she has noticed her hair "thinning" and wondered if this was from Toprol. She also has some choking at night which she attributes to her sleep apnea. She saw an ENT who suggested her symptoms may be from GERD. She saw Dr [name] who told her he didn't think it her symptoms were from GERD. Her palpitations have been stable. She was a little upset she wasn't seeing Dr [name] today.    Current Outpatient Prescriptions  Medication Sig Dispense Refill  . acetaminophen (TYLENOL) 500 MG tablet Take 500 mg by mouth as needed for pain.    Marland Kitchen ALPRAZolam (XANAX) 0.25 MG tablet Take 0.25 mg by mouth as needed for anxiety.     Marland Kitchen amLODipine (NORVASC) 2.5 MG tablet TAKE ONE TABLET BY MOUTH DAILY 90 tablet 1  . FIBER COMPLETE PO Take 1 tablet by mouth daily.    . metoprolol succinate (TOPROL-XL) 50 MG 24 hr tablet Take 1 and 1/2 tablets ( total 75 mg) by mouth twice  daily 270 tablet 3  . Multiple Vitamin (MULTIVITAMIN WITH MINERALS) TABS Take 2 tablets by mouth daily.     Marland Kitchen Phenylephrine-Acetaminophen (EQL SINUS CONGESTION/PAIN DAY PO) Take by mouth as needed.    . zolpidem (AMBIEN) 5 MG tablet Take 1 tablet (5 mg total) by mouth at bedtime as needed. for sleep 90 tablet 0   No current facility-administered medications for this visit.     Allergies  Allergen Reactions  . Codeine Nausea Only  . Gluten Meal     Unknown    Social History   Social History  . Marital status: Divorced    Spouse name: N/A  . Number of children: 4  .  Years of education: college    Occupational History  . retired     Social History Main Topics  . Smoking status: Never Smoker  . Smokeless tobacco: Never Used  . Alcohol use 0.6 oz/week    1 Standard drinks or equivalent per week     Comment: drink ETOH socially  . Drug use: No  . Sexual activity: Not on file   Other Topics Concern  . Not on file   Social History Narrative   Lives alone at home   Drinks tea but tries to drink decaf   Has 9 grandchildren      Review of Systems: General: negative for chills, fever, night sweats or weight changes.  Cardiovascular: negative for chest pain, dyspnea on exertion, edema, orthopnea, palpitations, paroxysmal nocturnal dyspnea or shortness of breath Dermatological: negative for rash Respiratory: negative for cough or wheezing Urologic: negative for hematuria Abdominal: negative for nausea, vomiting, diarrhea, bright red blood per rectum, melena, or hematemesis Neurologic: negative for visual changes, syncope, or dizziness All other systems reviewed and are otherwise negative except as noted above.    Blood pressure (!) 152/72, pulse 60, height 5' 2"  (1.575 m), weight 109 lb (49.4 kg).  General appearance: alert, cooperative, no distress and thin Neck: no  carotid bruit and no JVD Lungs: clear to auscultation bilaterally and she has significant scoliosis Heart: regular rate and rhythm Extremities: no edema Skin: Skin color, texture, turgor normal. No rashes or lesions Neurologic: Grossly normal  EKG NSR, Q V2  ASSESSMENT AND PLAN:   PSVT (paroxysmal supraventricular tachycardia) .  Sleep apnea .   PLAN  I'm not convinced she actually has alopecia, no bald spots or hair in the sink, but I suggested she could try decreasing her Toprol to 50 mg BID but she did not want to make any changes without talking to Dr [name]. Appointment made for Dr [name] March 7th.   Kerin Ransom PA-C 11/13/2016 12:08 PM

## 2016-11-13 NOTE — Patient Instructions (Signed)
Appointment with Dr.Kelly Wed 12/03/16 at 2:00 pm

## 2016-11-21 NOTE — Telephone Encounter (Signed)
Patient discussed with Lurena Joiner at appointment.

## 2016-11-27 DIAGNOSIS — M545 Low back pain: Secondary | ICD-10-CM | POA: Diagnosis not present

## 2016-11-27 DIAGNOSIS — M542 Cervicalgia: Secondary | ICD-10-CM | POA: Diagnosis not present

## 2016-12-03 ENCOUNTER — Encounter: Payer: Self-pay | Admitting: Cardiovascular Disease

## 2016-12-03 ENCOUNTER — Ambulatory Visit (INDEPENDENT_AMBULATORY_CARE_PROVIDER_SITE_OTHER): Payer: Medicare Other | Admitting: Cardiovascular Disease

## 2016-12-03 DIAGNOSIS — I10 Essential (primary) hypertension: Secondary | ICD-10-CM

## 2016-12-03 DIAGNOSIS — R002 Palpitations: Secondary | ICD-10-CM

## 2016-12-03 DIAGNOSIS — Z0131 Encounter for examination of blood pressure with abnormal findings: Secondary | ICD-10-CM | POA: Diagnosis not present

## 2016-12-03 DIAGNOSIS — I4892 Unspecified atrial flutter: Secondary | ICD-10-CM

## 2016-12-03 DIAGNOSIS — M4123 Other idiopathic scoliosis, cervicothoracic region: Secondary | ICD-10-CM

## 2016-12-03 DIAGNOSIS — Z13220 Encounter for screening for lipoid disorders: Secondary | ICD-10-CM

## 2016-12-03 DIAGNOSIS — R42 Dizziness and giddiness: Secondary | ICD-10-CM | POA: Diagnosis not present

## 2016-12-03 MED ORDER — AMLODIPINE BESYLATE 2.5 MG PO TABS: 2.5000 mg | ORAL_TABLET | Freq: Every day | ORAL | 1 refills | Status: DC

## 2016-12-03 MED ORDER — METOPROLOL SUCCINATE ER 50 MG PO TB24: ORAL_TABLET | ORAL | 3 refills | Status: DC

## 2016-12-03 MED ORDER — ZOLPIDEM TARTRATE 5 MG PO TABS: 5.0000 mg | ORAL_TABLET | Freq: Every evening | ORAL | 1 refills | Status: DC | PRN

## 2016-12-03 NOTE — Patient Instructions (Signed)
Medication Instructions:   NO CHANGES. He does recommend that you purchase some zyrtec over the counter for your allergies.  Labwork:  SLIPS PROVIDED TODAY.   Testing/Procedures: Your physician has requested that you have a carotid duplex. This test is an ultrasound of the carotid arteries in your neck. It looks at blood flow through these arteries that supply the brain with blood. Allow one hour for this exam. There are no restrictions or special instructions.  Your physician has requested that you have a  upper extremity arterial duplex. This test is an ultrasound of the arteries in the  arms. It looks at arterial blood flow in the  arms. Allow one hour for Lower and Upper Arterial scans. There are no restrictions or special instructions     Follow-Up:  2-3 months with Dr [name]  Any Other Special Instructions Will Be Listed Below (If Applicable).

## 2016-12-04 DIAGNOSIS — R42 Dizziness and giddiness: Secondary | ICD-10-CM | POA: Diagnosis not present

## 2016-12-04 DIAGNOSIS — Z13220 Encounter for screening for lipoid disorders: Secondary | ICD-10-CM | POA: Diagnosis not present

## 2016-12-04 DIAGNOSIS — I10 Essential (primary) hypertension: Secondary | ICD-10-CM | POA: Diagnosis not present

## 2016-12-04 DIAGNOSIS — R002 Palpitations: Secondary | ICD-10-CM | POA: Diagnosis not present

## 2016-12-04 DIAGNOSIS — I4892 Unspecified atrial flutter: Secondary | ICD-10-CM | POA: Diagnosis not present

## 2016-12-04 LAB — COMPREHENSIVE METABOLIC PANEL
ALT: 21 U/L (ref 6–29)
AST: 30 U/L (ref 10–35)
Albumin: 4.4 g/dL (ref 3.6–5.1)
Alkaline Phosphatase: 55 U/L (ref 33–130)
BUN: 10 mg/dL (ref 7–25)
CO2: 28 mmol/L (ref 20–31)
Calcium: 9.8 mg/dL (ref 8.6–10.4)
Chloride: 102 mmol/L (ref 98–110)
Creat: 0.85 mg/dL (ref 0.60–0.88)
Glucose, Bld: 103 mg/dL — ABNORMAL HIGH (ref 65–99)
Potassium: 4.5 mmol/L (ref 3.5–5.3)
Sodium: 139 mmol/L (ref 135–146)
Total Bilirubin: 0.7 mg/dL (ref 0.2–1.2)
Total Protein: 7.1 g/dL (ref 6.1–8.1)

## 2016-12-04 LAB — LIPID PANEL
Cholesterol: 154 mg/dL (ref <200)
HDL: 67 mg/dL (ref >50)
LDL Cholesterol: 71 mg/dL (ref <100)
Total CHOL/HDL Ratio: 2.3 Ratio (ref <5.0)
Triglycerides: 81 mg/dL (ref <150)
VLDL: 16 mg/dL (ref <30)

## 2016-12-04 LAB — CBC
HCT: 39.9 % (ref 35.0–45.0)
Hemoglobin: 13.1 g/dL (ref 11.7–15.5)
MCH: 31 pg (ref 27.0–33.0)
MCHC: 32.8 g/dL (ref 32.0–36.0)
MCV: 94.3 fL (ref 80.0–100.0)
MPV: 10.7 fL (ref 7.5–12.5)
Platelets: 313 10*3/uL (ref 140–400)
RBC: 4.23 MIL/uL (ref 3.80–5.10)
RDW: 13.2 % (ref 11.0–15.0)
WBC: 8.4 10*3/uL (ref 3.8–10.8)

## 2016-12-04 LAB — TSH: TSH: 1.07 mIU/L

## 2016-12-04 NOTE — Progress Notes (Signed)
Patient ID: Janet Nguyen, female   DOB: 10/22/1931, 83 y.o.   MRN: [id number]     Primary  M.D.: Dr. Lona Kettle  HPI: Janet Nguyen is a 83 y.o. female who presents for a 6 month followup cardiology evaluation.   Janet Nguyen has a history of documented SVT and also has PACs and PVCs  treated with beta blocker therapy. In April 2014 I further titrated her beta blocker therapy after cardiac event monitor revealed several bursts of recurrent SVT up to 177 beats per minute in March.  In July after Janet Nguyen had had 2 episodes of chest fluttering which each lasted over 30 minutes which he did take metoprolol tartrate with relief I recommended further titration of her Toprol to 75 mg in the morning and 50 mg at night and if necessary Janet Nguyen could further titrate this to 75 twice a day.  Janet Nguyen has a history of obstructive sleep apnea but despite multiple attempts at CPAP utilization Janet Nguyen has not been able to tolerate this. Janet Nguyen was referred to Janet Nguyen and has a customized dental appliance with mandibular advancement with improvement in some of her symptomatology. Due concern that her teeth may be moving in more recently Janet Nguyen has has not been using customized appliance daily but has been using her old non-customized mouthguard.  At times shen has awakened abruptly from a dream with her heart pounding and a sensation of hot flashes, gasping for breath.   Janet Nguyen also states that her scoliosis is getting worse. Janet Nguyen does have left-sided musculoskeletal type chest pain due to her spine angulation.  Beause of her significant scoliosis, Janet Nguyen feels Janet Nguyen must sleep on her back.   Janet Nguyen has a history of GERD for which Janet Nguyen has been taking Nexium.  Janet Nguyen presents for evaluation.  I had scheduled her for an overnight oximetry to see if Janet Nguyen is a candidate for supplemental oxygen at nighttime since Janet Nguyen refuses to use CPAP and only very rarely uses her customized mouthpiece.  Janet Nguyen does wear a mouthguard to reduce bruxism.  Oximetry  study was performed overnight on February 23/24.  Her mean oxygen saturation was 93.12%.  Janet Nguyen spent 12 minutes and 16 seconds with O2 sat duration below 88% with the lowest O2 sat duration of 80%. I tried to set her up for supplemental oxygen at bedtime.  However, Janet Nguyen has been denied for this on multiple occasions by her insurance/Medicare.  Janet Nguyen feels that Janet Nguyen is sleeping better.  Janet Nguyen can only sleep in her back due to scoliosis.  Her left side is concave; her right side is convex.  Janet Nguyen has had issues with labile blood pressure. Janet Nguyen has had issues with lower back discomfort and sciatica leading to emergency room evaluation in November 2016.  Janet Nguyen saw Janet Nguyen for neurosurgical evaluation. Janet Nguyen has migraine headaches. Janet Nguyen  Recently saw Janet Nguyen for these migraine headaches.  Janet Nguyen has been on Toprol-XL 75 mg twice a day for her palpitations and presently denies any awareness an extra heartbeats. Janet Nguyen takes Xanax on an as-needed basis for anxiety.  Janet Nguyen has been taking Nexium 20, no grams every other day for GERD. Janet Nguyen states her scoliosis is getting worse.  Has been experiencing more fatigue.  Janet Nguyen also notes some occasional left hand numbness.  In a mouth guard but no ureteral longer uses her mandibular advancement device and not tolerate CPAP therapy for her obstructive sleep apnea.    When I saw her earlier this year Janet Nguyen was complaining of  significant increased fatigability.  Janet Nguyen had experienced left-sided chest discomfort which most likely was related to her significant scoliosis the potential neuropathy causing intermittent left arm and hand numbness.  I slightly reduced her Toprol-XL which Janet Nguyen had been taking 150 mg daily and a 75 twice a day regimen to 75 and milligrams in the morning and 50 mg with ultimate plan to decrease this to 50 twice a day.  Janet Nguyen states when Janet Nguyen reduce this, Janet Nguyen began to notice more optical migraines and resume taking the higher dose Toprol at 75 twice a day with improvement.  Janet Nguyen has  seen Janet Nguyen for her paresthesias.  Janet Nguyen admits to significant anxiety.  Janet Nguyen had requested zolpidem for sleep initiation and maintenance.  In the past, we had tried 6.25 slow-release version to help with sleep maintenance, but due to cost issues preferred the 5 mg sleep initiation dose.  When I last saw her, her palpitations were improved..  Janet Nguyen does experience optical migraine headaches.  Janet Nguyen was being seen by neurologist, Janet Nguyen  Janet Nguyen continues to have issues with her scoliosis causing discomfort in her neck and arm due to her distortion.  He tells me Janet Nguyen underwent endoscopy and colonoscopy as well as banding of hemorrhoids.  Janet Nguyen had a benign polyp removed.  Janet Nguyen has issues with spastic colon.  Her sleep is better with zolpiden 5 mg.  He saw Janet Nguyen on 11/13/2016.  Janet Nguyen had noticed that her hair was "thinning" and was concerned about this being the result of Toprol.  He suggested possibly decreasing Toprol but Janet Nguyen preferred not until Janet Nguyen had seen me.  Since that time, Janet Nguyen continues to experience some optical migraines.  Janet Nguyen has noticed some leg left neck discomfort.  Janet Nguyen denies any patches of hair loss.  Janet Nguyen denies differential arm weakness.  Janet Nguyen continues to have difficulty with her scoliosis with hip pain and rib cage discomfort.  Janet Nguyen presents for evaluation.  Past Medical History:  Diagnosis Date  . Arrhythmia    History of SVT with documented PVC'S and  PAC'S  12/08/12 Nuc stress test normal LV EF 74%  Event Monitor  12/01/12-01/03/13  . Celiac disease    treated by Dr. [name]  . GERD (gastroesophageal reflux disease)   . Intervertebral disc stenosis of neural canal of cervical region   . Irregular heart beat 11/30/12   ECHO-EF 60-65%  . Osteoporosis   . Scoliosis   . Scoliosis   . Sleep apnea 10/02/11 [facility]   Sleep study AHI -total sleep 10.3/hr  64.0/ hr during REM sleep.RDI 22.8/hr during total sleep 64.0/hr during REM sleep The lowest O2 sat during Non-REM and REM  sleep was 86% and 88% respectively. 04/08/12 CPAP/BIPAP titration study [facility]    Past Surgical History:  Procedure Laterality Date  . APPENDECTOMY     ruptured at age 70 and had surgery  . CARDIAC CATHETERIZATION  01/27/06    Allergies  Allergen Reactions  . Codeine Nausea Only  . Gluten Meal     Unknown    Current Outpatient Prescriptions  Medication Sig Dispense Refill  . acetaminophen (TYLENOL) 500 MG tablet Take 500 mg by mouth as needed for pain.    Marland Kitchen ALPRAZolam (XANAX) 0.25 MG tablet Take 0.25 mg by mouth as needed for anxiety.     Marland Kitchen amLODipine (NORVASC) 2.5 MG tablet Take 1 tablet (2.5 mg total) by mouth daily. 90 tablet 1  . FIBER COMPLETE PO Take 1  tablet by mouth daily.    . metoprolol succinate (TOPROL-XL) 50 MG 24 hr tablet Take 1 and 1/2 tablets ( total 75 mg) by mouth twice  daily 270 tablet 3  . Multiple Vitamin (MULTIVITAMIN WITH MINERALS) TABS Take 2 tablets by mouth daily.     Marland Kitchen Phenylephrine-Acetaminophen (EQL SINUS CONGESTION/PAIN DAY PO) Take by mouth as needed.    . zolpidem (AMBIEN) 5 MG tablet Take 1 tablet (5 mg total) by mouth at bedtime as needed. for sleep 30 tablet 1   No current facility-administered medications for this visit.     Socially Janet Nguyen is divorced has 4 children 9 grandchildren. Janet Nguyen does exercise. No tobacco use. Janet Nguyen does occasional wine.  ROS General: Negative; No fevers, chills, or night sweats;  HEENT: Negative; No changes in vision or hearing, sinus congestion, difficulty swallowing Pulmonary: Negative; No cough, wheezing, shortness of breath, hemoptysis Cardiovascular: Positive for occasional chest wall pain and nocturnal palpitations GI: Negative; No nausea, vomiting, diarrhea, or abdominal pain GU: Negative; No dysuria, hematuria, or difficulty voiding Musculoskeletal: Positive for significant scoliosis; fibromyalgia;  joint pain, or weakness Hematologic/Oncology: Negative; no easy bruising,  bleeding Endocrine: Negative; no heat/cold intolerance; no diabetes Neuro: Negative; no changes in balance, headaches Skin: Negative; No rashes or skin lesions Psychiatric: Negative; No behavioral problems, depression Sleep: Positive for sleep apnea ; No snoring, daytime sleepiness, hypersomnolence, bruxism, restless legs, hypnogognic hallucinations, no cataplexy Other comprehensive 14 point system review is negative.  PE BP 122/62   Pulse 65   Ht _0  (1.626 m)   Wt 109 lb (49.4 kg)   BMI 18.71 kg/m    Repeat BP by me: Blood pressure 118/78 in the left arm and 140/80 in the right arm.  Wt Readings from Last 3 Encounters:  12/03/16 109 lb (49.4 kg)  11/13/16 109 lb (49.4 kg)  06/26/16 112 lb (50.8 kg)   General: Alert, oriented, no distress.  Skin: normal turgor, no rashes HEENT: Normocephalic, atraumatic. Pupils round and reactive; sclera anicteric;no lid lag.  Nose without nasal septal hypertrophy Mouth/Parynx benign; Mallinpatti scale 3  Neck: No JVD, no carotid bruits without carotid upstroke Lungs: clear to ausculatation and percussion; no wheezing or rales Chest wall: Mild tenderness to palpation in the lateral left pectoral region/rib. Janet Nguyen does have significant scoliosis.  Mild tenderness over the sternal notch and there does appear to be a small cyst over the left lateral aspect. Heart: RRR, s1 s2 normal 1/6 systolic murmur; no ectopy on prolonged auscultation Abdomen: soft, nontender; no hepatosplenomehaly, BS+; abdominal aorta nontender and not dilated by palpation. Back: No CVA tenderness. Pulses 2+ Extremities: no clubbing cyanosis or edema, Homan's sign negative  Neurologic: grossly nonfocal Psychological: Normal affect and mood  ECG (independently read by me): Normal sinus rhythm at 65 bpm.  No ectopy.  Normal intervals.  September 2017 ECG (independently read by me): Normal sinus rhythm at 62 bpm.  QS V1 and V2.  Normal intervals.  April 2017 ECG  (independently read by me): Normal sinus rhythm at 70 bpm.  No ectopy.  PR interval 158 ms and QTc interval 414 ms.  March 2017 ECG (independently read by me): normal sinus rhythm at 61 bpm..  No ST segment changes.  Normal intervals.  QTc interval 396 ms.  August 2014 ECG (independently read by me): Normal sinus rhythm at 63 bpm.  QRS couplets V1 V2.  No cigarette ST-T changes.  May 2016 ECG (independently read by me): Sinus bradycardia 59 bpm.  No  ectopy.  February 2016 ECG (independently read by me): Sinus bradycardia 56 bpm.  Normal intervals.  Prior ECG (independently read by me): Normal sinus rhythm at 62 beats per minute.  QTc interval 411 milliseconds.  QRS complex V1, V2.  Normal intervals.  ECG (independently read by me): Normal sinus rhythm at 62 beats per minute. Normal intervals. QTc interval 399 ms.  Prior ECG of 07/13/2013: Sinus rhythm at 61 beats per minute. QTc interval 406 ms. PR interval normal at 168 ms.  LABS: BMP Latest Ref Rng & Units 08/19/2015 12/04/2014 11/29/2012  Glucose 65 - 99 mg/dL 113(H) 106(H) -  BUN 6 - 20 mg/dL 8 12 -  Creatinine 0.44 - 1.00 mg/dL 0.86 0.86 0.79  Sodium 135 - 145 mmol/L 132(L) 138 -  Potassium 3.5 - 5.1 mmol/L 3.8 4.0 -  Chloride 101 - 111 mmol/L 98(L) 102 -  CO2 22 - 32 mmol/L 23 29 -  Calcium 8.9 - 10.3 mg/dL 9.8 9.9 -   Hepatic Function Latest Ref Rng & Units 12/04/2014 05/11/2010  Total Protein 6.0 - 8.3 g/dL 7.4 7.2  Albumin 3.5 - 5.2 g/dL 4.5 3.3(L)  AST 0 - 37 U/L 37 18  ALT 0 - 35 U/L 24 18  Alk Phosphatase 39 - 117 U/L 53 66  Total Bilirubin 0.3 - 1.2 mg/dL 0.7 0.6   CBC Latest Ref Rng & Units 08/19/2015 12/04/2014 11/29/2012  WBC 4.0 - 10.5 K/uL 15.0(H) 9.9 11.4(H)  Hemoglobin 12.0 - 15.0 g/dL 13.4 14.0 13.5  Hematocrit 36.0 - 46.0 % 39.5 41.1 38.2  Platelets 150 - 400 K/uL 231 323 264   Lab Results  Component Value Date   TSH 1.612 11/29/2012  Lipid Panel  No results found for: CHOL, TRIG, HDL, CHOLHDL, VLDL, LDLCALC,  LDLDIRECT   RADIOLOGY: No results found.  IMPRESSION:  1. Essential hypertension, benign   2. Heart palpitations   3. Dizziness   4. Screening cholesterol level   5. Encounter for examination of blood pressure with abnormal findings   6. Other idiopathic scoliosis, cervicothoracic region        ASSESSMENT AND PLAN: Janet Nguyen is an 83 year old female who has a history of documented SVT and PACs/PVCs which have been controlled with beta blocker therapy. Janet Nguyen has documented normal systolic function on her echo in March 2014 with grade 1 diastolic dysfunction and had  significant left atrial dilatation and mild dilatation of the right ventricle with moderate dilatation of the right atrium. Janet Nguyen has mild/moderate pulmonary hypertension with PA pressures of 42 mm. Her daily palpitations have improved on  Toprol-XL 75 mg twice a day.  Janet Nguyen is sleeping significantly better with her new chin strap and old mouth guard.  Janet Nguyen previously had a customized oral appliance by Janet Nguyen, but due to discomfort stopped using this.  Janet Nguyen never could tolerate any trial of CPAP therapy. Janet Nguyen had experienced migraine headaches and recently underwent neurologic evaluation. Janet Nguyen has also experienced left-sided chest discomfort which may be related to her significant scoliosis and potential neuropathy causing intermittent left arm, hand numbness. Last year when Janet Nguyen had increased fatigability and her Toprol dose was reduced, Janet Nguyen felt an increase recurrence of optical migraines and these have been controlled with resumption of her previous 75 mg twice a day regimen of long-acting Toprol.  On examination of her scalp.  There are no patches of alopecia.  I'm not convinced the mild hair thinning is related to metoprolol and with her improvement in  palpitations.  I've suggested Janet Nguyen continue her present dosing.  In 2017 Janet Nguyen had undergone carotid duplex imaging which did not reveal significant plaque or significant stenosis.  Janet Nguyen  antegrade flow in the vertebral arteries.  At that time there was a suggestion of a 15 mm blood pressure differential.  On exam today, there is a blood pressure differential of 118/78 in the left arm and 140/80 in the right arm.   I am scheduling her for follow-up carotid and upper extremity arterial Doppler studies to assess for subclavian stenosis.  Janet Nguyen may ultimately require CT angiography for more definitive evaluation but I will not do this.  Presently in 20 I obtain laboratory to make certain Janet Nguyen has normal renal function.  He was evaluated by Dr. [name] does not have GERD causing some neck choking.  I have suggested a trial of Zyrtec.  In the past Janet Nguyen was documented have sleep apnea but has refused CPAP therapy and no longer wears her customized oral appliance.  A complete set of laboratory will be obtained.  I will see her in 2-3 months for reevaluation. Time spent: 25 minutes  Troy Sine, MD, [facility]  12/04/2016 7:09 PM

## 2016-12-17 DIAGNOSIS — R490 Dysphonia: Secondary | ICD-10-CM | POA: Diagnosis not present

## 2016-12-17 DIAGNOSIS — K648 Other hemorrhoids: Secondary | ICD-10-CM | POA: Diagnosis not present

## 2016-12-25 ENCOUNTER — Other Ambulatory Visit (HOSPITAL_COMMUNITY): Payer: Medicare Other

## 2016-12-25 ENCOUNTER — Ambulatory Visit (HOSPITAL_COMMUNITY)
Admission: RE | Admit: 2016-12-25 | Discharge: 2016-12-25 | Disposition: A | Discharge status: Home / Self Care | Payer: Medicare Other | Source: Ambulatory Visit | Attending: Cardiovascular Disease | Admitting: Cardiovascular Disease

## 2016-12-25 DIAGNOSIS — I6523 Occlusion and stenosis of bilateral carotid arteries: Secondary | ICD-10-CM | POA: Insufficient documentation

## 2016-12-25 DIAGNOSIS — R42 Dizziness and giddiness: Secondary | ICD-10-CM | POA: Diagnosis not present

## 2016-12-30 ENCOUNTER — Other Ambulatory Visit (HOSPITAL_COMMUNITY): Payer: Self-pay | Admitting: Gastroenterology

## 2016-12-30 DIAGNOSIS — R1084 Generalized abdominal pain: Secondary | ICD-10-CM

## 2017-01-05 ENCOUNTER — Ambulatory Visit: Payer: Medicare Other | Admitting: Physical Therapy

## 2017-01-07 ENCOUNTER — Encounter (HOSPITAL_COMMUNITY): Payer: Self-pay

## 2017-01-07 ENCOUNTER — Ambulatory Visit (HOSPITAL_COMMUNITY): Payer: Medicare Other | Attending: Gastroenterology

## 2017-01-08 DIAGNOSIS — H9313 Tinnitus, bilateral: Secondary | ICD-10-CM | POA: Diagnosis not present

## 2017-01-08 DIAGNOSIS — J310 Chronic rhinitis: Secondary | ICD-10-CM | POA: Diagnosis not present

## 2017-01-08 DIAGNOSIS — H9113 Presbycusis, bilateral: Secondary | ICD-10-CM | POA: Diagnosis not present

## 2017-01-08 DIAGNOSIS — H903 Sensorineural hearing loss, bilateral: Secondary | ICD-10-CM | POA: Diagnosis not present

## 2017-01-10 DIAGNOSIS — H9113 Presbycusis, bilateral: Secondary | ICD-10-CM | POA: Insufficient documentation

## 2017-01-10 DIAGNOSIS — H9313 Tinnitus, bilateral: Secondary | ICD-10-CM | POA: Insufficient documentation

## 2017-01-15 ENCOUNTER — Ambulatory Visit: Payer: Medicare Other | Attending: Orthopaedic Surgery | Admitting: Physical Therapy

## 2017-01-15 ENCOUNTER — Encounter: Payer: Self-pay | Admitting: Physical Therapy

## 2017-01-15 DIAGNOSIS — M4125 Other idiopathic scoliosis, thoracolumbar region: Secondary | ICD-10-CM | POA: Diagnosis not present

## 2017-01-15 DIAGNOSIS — M4123 Other idiopathic scoliosis, cervicothoracic region: Secondary | ICD-10-CM | POA: Insufficient documentation

## 2017-01-15 DIAGNOSIS — M542 Cervicalgia: Secondary | ICD-10-CM | POA: Insufficient documentation

## 2017-01-15 DIAGNOSIS — M6281 Muscle weakness (generalized): Secondary | ICD-10-CM | POA: Diagnosis not present

## 2017-01-15 DIAGNOSIS — R293 Abnormal posture: Secondary | ICD-10-CM | POA: Insufficient documentation

## 2017-01-15 DIAGNOSIS — M62830 Muscle spasm of back: Secondary | ICD-10-CM | POA: Diagnosis not present

## 2017-01-15 NOTE — Therapy (Signed)
[facility] [address], [place], [place], [address] Phone: [phone]   Fax:  [phone]  Physical Therapy Evaluation  Patient Details  Name: Janet Nguyen MRN: [id number] Date of Birth: [birth year] Referring Provider: Dr. Rennis Harding  Encounter Date: 01/15/2017      PT End of Session - 01/15/17 1251    Visit Number 1   Number of Visits 10   Date for PT Re-Evaluation 03/12/17   Authorization Type medicare g-code 10th visit; kx modifier on 15th visit   PT Start Time 1145   PT Stop Time 1250   PT Time Calculation (min) 65 min   Activity Tolerance Patient tolerated treatment well   Behavior During Therapy [facility] for tasks assessed/performed      Past Medical History:  Diagnosis Date  . Arrhythmia    History of SVT with documented PVC'S and  PAC'S  12/08/12 Nuc stress test normal LV EF 74%  Event Monitor  12/01/12-01/03/13  . Celiac disease    treated by Dr. [name]  . GERD (gastroesophageal reflux disease)   . Intervertebral disc stenosis of neural canal of cervical region   . Irregular heart beat 11/30/12   ECHO-EF 60-65%  . Osteoporosis   . Scoliosis   . Scoliosis   . Sleep apnea 10/02/11 [facility]   Sleep study AHI -total sleep 10.3/hr  64.0/ hr during REM sleep.RDI 22.8/hr during total sleep 64.0/hr during REM sleep The lowest O2 sat during Non-REM and REM sleep was 86% and 88% respectively. 04/08/12 CPAP/BIPAP titration study [facility]    Past Surgical History:  Procedure Laterality Date  . APPENDECTOMY     ruptured at age 18 and had surgery  . CARDIAC CATHETERIZATION  01/27/06    There were no vitals filed for this visit.       Subjective Assessment - 01/15/17 1201    Subjective Patient has lost 4 inches and weight. Patient reports she has back pain.  Patietn walks regularly.  Patient has stopped exercise in 2014 due to fibromyalgis.  Patient is unable to  wear a bra due to bones sticking out. Patient has had physical therapy in the past.     Patient Stated Goals elongating trunk, decrease nexk pain, feel better and learn exercise, how to manage pain   Currently in Pain? Yes   Pain Score 6   pain can get 10/10   Pain Location Back  neck   Pain Orientation Right;Left;Upper;Lower;Mid   Pain Descriptors / Indicators Aching;Dull;Sharp   Pain Type Chronic pain   Pain Onset More than a month ago   Pain Frequency Constant   Aggravating Factors  sitting, daily activities   Pain Relieving Factors heating pad 30 min, walking            [facility] PT Assessment - 01/15/17 0001      Assessment   Medical Diagnosis M41.50 Other secondary scoliosis, site unspecified   Referring Provider Dr. Rennis Harding   Onset Date/Surgical Date 07/30/16   Prior Therapy yes     Precautions   Precautions Other (comment)   Precaution Comments oosteoporosis      Restrictions   Weight Bearing Restrictions No     Balance Screen   Has the patient fallen in the past 6 months No   Has the patient had a decrease in activity level because of a fear of falling?  No   Is the patient reluctant to leave their  home because of a fear of falling?  No     Home Environment   Living Environment Private residence   Living Arrangements Alone   Type of [place] to enter   Entrance Stairs-Number of Steps 1   [place] One level     Prior Function   Level of Independence Independent   Leisure garden, [place], walk daily     Cognition   Overall Cognitive Status Within Functional Limits for tasks assessed     Observation/Other Assessments   Focus on Therapeutic Outcomes (FOTO)  48% limitation  goal is 42% limitation     Posture/Postural Control   Posture/Postural Control Postural limitations   Postural Limitations Rounded Shoulders;Forward head;Left pelvic obliquity;Increased thoracic kyphosis  hump on right thoracic, concave to left thoracic    Posture Comments decreased space between ribs and pelvis     ROM / Strength   AROM / PROM / Strength AROM;Strength     Strength   Right Hip Extension 4/5   Right Hip ABduction 3+/5   Left Hip Extension 4/5   Left Hip ABduction 3+/5     Palpation   Palpation comment tenderness located in bil. upper trap, interscapular area, cervical paraspinals, right quadratus     Transfers   Transfers Not assessed                   [facility] Adult PT Treatment/Exercise - 01/15/17 0001      Neuro Re-ed    Neuro Re-ed Details  postural education with elongating her neck and spine to separate the rib cage from the pelvis     Manual Therapy   Manual Therapy Soft tissue mobilization   Soft tissue mobilization cervical paraspinals, subocciptials, upper trap, interscapular                PT Education - 01/15/17 1251    Education provided Yes   Education Details postural education, elongating her spine   Person(s) Educated Patient   Methods Explanation;Demonstration;Verbal cues;Handout   Comprehension Returned demonstration;Verbalized understanding          PT Short Term Goals - 01/15/17 1255      PT SHORT TERM GOAL #1   Title independent with postural correction exercises to elongate her trunk and separate rib cage from pelvis   Time 4   Period Weeks   Status New     PT SHORT TERM GOAL #2   Title understand what postures to avoid with osteoporosis to reduce pain and risk of spinal fractures   Time 4   Period Weeks   Status New     PT SHORT TERM GOAL #3   Title understand correct body mechanics with daily tasks and gardening to decreased pain in back   Time 4   Period Weeks   Status New     PT SHORT TERM GOAL #4   Title back and cervical pain decreased to moderate due to decreased strain during daily activities   Time 4   Period Weeks   Status New           PT Long Term Goals - 01/15/17 1258      PT LONG TERM GOAL #1   Title independent with postural  strengthening exercises   Time 8   Period Weeks   Status New     PT LONG TERM GOAL #2   Title FOTO score </= 42% limitation   Time 8   Period Weeks  Status New     PT LONG TERM GOAL #3   Title back and cervical pain decreased to minimal during daily activities due to improve postural muscle strength   Time 8   Period Weeks   Status New     PT LONG TERM GOAL #4   Title ability to perform gardening and daily activites with >/= 50% greater ease due to increased postural strength   Time 8   Period Weeks   Status New               Plan - 01/15/17 1252    Clinical Impression Statement Patient is a 83 year old female with chronic back pain which has become worse over the past 6 months. Patient reports constant pain at level 6/10 but can get to 10/10 with sitting and daily activities.  Patient reports her pain is better with heat, ice, and walking.  Patient posture consists with forward head, rounded shoulders, concave curve to the left , increased thoracic kyphosis,  and right ilium higher, rib cage sits on the pelvis, posteriorly rotated pelvis. Patient has weak postural muscles and hips.  Patient is moderately complex patient due to an evolving condtion and comorbidities such as osteoporosis, Celiac disease, scoliosis that will impact care provided. Patient will benefit from skilled therapy to reduce pain, improve postural muscle strength, and improve function.   Rehab Potential Excellent   Clinical Impairments Affecting Rehab Potential osteoporosis; Celiac Disease; scoliosis   PT Frequency 2x / week   PT Duration 8 weeks   PT Treatment/Interventions Cryotherapy;Electrical Stimulation;Moist Heat;Ultrasound;Therapeutic activities;Therapeutic exercise;Neuromuscular re-education;Patient/family education;Manual techniques;Dry needling;Energy conservation;Taping   PT Next Visit Plan information on do's and do nots for osteoporosis, soft tissue work to cervical, interscapular area, and  quadratus, stretch for left side of spine, hamstring, upper traps and subocciptals, body mechanics with daily acitivitie   PT Home Exercise Plan body mechanics   Recommended Other Services None   Consulted and Agree with Plan of Care Patient      Patient will benefit from skilled therapeutic intervention in order to improve the following deficits and impairments:  Decreased mobility, Decreased strength, Postural dysfunction, Impaired flexibility, Decreased activity tolerance, Pain, Increased muscle spasms, Decreased endurance, Increased fascial restricitons  Visit Diagnosis: Muscle weakness (generalized) - Plan: PT plan of care cert/re-cert  Abnormal posture - Plan: PT plan of care cert/re-cert  Other idiopathic scoliosis, cervicothoracic region - Plan: PT plan of care cert/re-cert  Other idiopathic scoliosis, thoracolumbar region - Plan: PT plan of care cert/re-cert  Cervicalgia - Plan: PT plan of care cert/re-cert  Muscle spasm of back - Plan: PT plan of care cert/re-cert      G-Codes - 70/62/37 1249    Functional Assessment Tool Used (Outpatient Only) FOTO score 48% limitation  goal is 42% limitation   Functional Limitation Mobility: Walking and moving around   Mobility: Walking and Moving Around Current Status (S2831) At least 40 percent but less than 60 percent impaired, limited or restricted   Mobility: Walking and Moving Around Goal Status [phone]) At least 40 percent but less than 60 percent impaired, limited or restricted       Problem List Patient Active Problem List   Diagnosis Date Noted  . Fatigue 12/23/2015  . Sciatica of right side 10/06/2015  . History of migraine headaches 10/06/2015  . Frequent PVCs 12/28/2013  . Premature atrial contractions 12/28/2013  . PSVT (paroxysmal supraventricular tachycardia) ([place]) 12/28/2013  . Heart palpitations 07/13/2013  . Sleep apnea 04/11/2013  .  Scoliosis 04/11/2013  . Atrial flutter with rapid ventricular response  ([place]) 11/29/2012  . Chest pain, atypical 11/29/2012  . Fibromyalgia syndrome 11/29/2012  . Chronic steroid use 11/29/2012    Earlie Counts, PT 01/15/17 1:27 PM    [facility] [address] [place], [place], [address] Phone: [phone]   Fax:  [phone]  Name: Janet Nguyen MRN: [id number] Date of Birth: 21-Jul-1932

## 2017-01-15 NOTE — Patient Instructions (Addendum)
  Balloon Breath    Place hands LIGHTLY on belly below navel. Imagine a balloon inside belly. Blow up balloon on breath IN, deflate balloon on breath OUT. Contract abdominals slightly to assist breath OUT. Time _1__ minutes.  Copyright  VHI. All rights reserved.  Waist Lengthener    Feel top of pelvis (hipbones) all the way around and bottom of ribs all the way around. Lengthen space between them by lifting ribs up and away from pelvis. Repeat frequently during the day.  Copyright  VHI. All rights reserved.  Foot Triangle / [employer], feet hip-width apart, on [employer]. Even weight between feet. Even weight between ball and heel of each foot. Press feet into floor. Feel postural muscles contract. Practice frequently during the day. Hold 5 sec.  Copyright  VHI. All rights reserved.  Think Taller    Stand 2-4 inches taller. Imagine a string, golden thread, rope, or steel cable pulling up at the crown of the head. Avoid tilting head back - keep chin parallel to floor. Do not shrug shoulders. Practice frequently during the day.   Copyright  VHI. All rights reserved.  [facility] [address] [place], [place] [address] Phone # [phone] Fax [phone]

## 2017-01-22 DIAGNOSIS — H04123 Dry eye syndrome of bilateral lacrimal glands: Secondary | ICD-10-CM | POA: Diagnosis not present

## 2017-01-22 DIAGNOSIS — M3501 Sicca syndrome with keratoconjunctivitis: Secondary | ICD-10-CM | POA: Diagnosis not present

## 2017-01-22 DIAGNOSIS — H53143 Visual discomfort, bilateral: Secondary | ICD-10-CM | POA: Diagnosis not present

## 2017-01-23 ENCOUNTER — Encounter: Payer: Medicare Other | Admitting: Physical Therapy

## 2017-01-26 ENCOUNTER — Ambulatory Visit: Payer: Medicare Other | Admitting: Physical Therapy

## 2017-01-26 ENCOUNTER — Encounter: Payer: Self-pay | Admitting: Physical Therapy

## 2017-01-26 DIAGNOSIS — M4125 Other idiopathic scoliosis, thoracolumbar region: Secondary | ICD-10-CM

## 2017-01-26 DIAGNOSIS — M542 Cervicalgia: Secondary | ICD-10-CM

## 2017-01-26 DIAGNOSIS — R293 Abnormal posture: Secondary | ICD-10-CM | POA: Diagnosis not present

## 2017-01-26 DIAGNOSIS — M6281 Muscle weakness (generalized): Secondary | ICD-10-CM

## 2017-01-26 DIAGNOSIS — M62830 Muscle spasm of back: Secondary | ICD-10-CM

## 2017-01-26 DIAGNOSIS — M4123 Other idiopathic scoliosis, cervicothoracic region: Secondary | ICD-10-CM

## 2017-01-26 NOTE — Therapy (Signed)
[facility] [address], [place], [place], [address] Phone: [phone]   Fax:  [phone]  Physical Therapy Treatment  Patient Details  Name: Janet Nguyen MRN: [id number] Date of Birth: 01/03/1932 Referring Provider: Dr. Rennis Harding  Encounter Date: 01/26/2017      PT End of Session - 01/26/17 1406    Visit Number 2   Number of Visits 10   Date for PT Re-Evaluation 03/12/17   Authorization Type medicare g-code 10th visit; kx modifier on 15th visit   PT Start Time 1403   PT Stop Time 1446   PT Time Calculation (min) 43 min   Activity Tolerance Patient tolerated treatment well   Behavior During Therapy [facility] for tasks assessed/performed      Past Medical History:  Diagnosis Date  . Arrhythmia    History of SVT with documented PVC'S and  PAC'S  12/08/12 Nuc stress test normal LV EF 74%  Event Monitor  12/01/12-01/03/13  . Celiac disease    treated by Dr. [name]  . GERD (gastroesophageal reflux disease)   . Intervertebral disc stenosis of neural canal of cervical region   . Irregular heart beat 11/30/12   ECHO-EF 60-65%  . Osteoporosis   . Scoliosis   . Scoliosis   . Sleep apnea 10/02/11 [facility]   Sleep study AHI -total sleep 10.3/hr  64.0/ hr during REM sleep.RDI 22.8/hr during total sleep 64.0/hr during REM sleep The lowest O2 sat during Non-REM and REM sleep was 86% and 88% respectively. 04/08/12 CPAP/BIPAP titration study [facility]    Past Surgical History:  Procedure Laterality Date  . APPENDECTOMY     ruptured at age 52 and had surgery  . CARDIAC CATHETERIZATION  01/27/06    There were no vitals filed for this visit.                       [facility] Adult PT Treatment/Exercise - 01/26/17 0001      Lumbar Exercises: Standing   Other Standing Lumbar Exercises D1 extension and horizontal abduction  for anti rotation of core     Lumbar  Exercises: Seated   Other Seated Lumbar Exercises Sitting red ball diagonals  VC for posture, some pain in mid back     Manual Therapy   Manual Therapy Soft tissue mobilization   Manual therapy comments I right side lying   Soft tissue mobilization Thoracic paraspinals, Left quadratus                PT Education - 01/26/17 1718    Education provided Yes   Education Details core strengthening   Person(s) Educated Patient   Methods Explanation;Handout;Demonstration   Comprehension Verbalized understanding          PT Short Term Goals - 01/26/17 1407      PT SHORT TERM GOAL #1   Title independent with postural correction exercises to elongate her trunk and separate rib cage from pelvis   Time 4   Period Weeks   Status On-going     PT SHORT TERM GOAL #2   Title understand what postures to avoid with osteoporosis to reduce pain and risk of spinal fractures   Time 4   Period Weeks   Status On-going     PT SHORT TERM GOAL #3   Title understand correct body mechanics with daily tasks and gardening to decreased pain in back   Time 4  Period Weeks   Status On-going     PT SHORT TERM GOAL #4   Title back and cervical pain decreased to moderate due to decreased strain during daily activities   Time 4   Period Weeks   Status On-going           PT Long Term Goals - 01/15/17 1258      PT LONG TERM GOAL #1   Title independent with postural strengthening exercises   Time 8   Period Weeks   Status New     PT LONG TERM GOAL #2   Title FOTO score </= 42% limitation   Time 8   Period Weeks   Status New     PT LONG TERM GOAL #3   Title back and cervical pain decreased to minimal during daily activities due to improve postural muscle strength   Time 8   Period Weeks   Status New     PT LONG TERM GOAL #4   Title ability to perform gardening and daily activites with >/= 50% greater ease due to increased postural strength   Time 8   Period Weeks   Status  New               Plan - 01/26/17 1416    Clinical Impression Statement Patient presents with poor posture and significant scoliosis with curvature towards right thoracic spine. Patient did ok with all exercises. She liked that the exercises were new although resistance may have been to high as patient had some increased shoulder pain with exercises. Patient instructed to try less resistance at home. Patient responded very well to thracic soft tissue mobilization and request more manual to back and neck at next visit. Patient will continue to benefit from skilled thearpy for core strength and postural training.    Rehab Potential Excellent   Clinical Impairments Affecting Rehab Potential osteoporosis; Celiac Disease; scoliosis   PT Frequency 2x / week   PT Duration 8 weeks   PT Treatment/Interventions Cryotherapy;Electrical Stimulation;Moist Heat;Ultrasound;Therapeutic activities;Therapeutic exercise;Neuromuscular re-education;Patient/family education;Manual techniques;Dry needling;Energy conservation;Taping   PT Next Visit Plan Soft tissue work in right side lying to thracic spine, cervical spine in supine. Add 1 or 2 exercises for strengthening   Consulted and Agree with Plan of Care Patient      Patient will benefit from skilled therapeutic intervention in order to improve the following deficits and impairments:  Decreased mobility, Decreased strength, Postural dysfunction, Impaired flexibility, Decreased activity tolerance, Pain, Increased muscle spasms, Decreased endurance, Increased fascial restricitons  Visit Diagnosis: Muscle weakness (generalized)  Abnormal posture  Other idiopathic scoliosis, cervicothoracic region  Other idiopathic scoliosis, thoracolumbar region  Cervicalgia  Muscle spasm of back     Problem List Patient Active Problem List   Diagnosis Date Noted  . Fatigue 12/23/2015  . Sciatica of right side 10/06/2015  . History of migraine headaches  10/06/2015  . Frequent PVCs 12/28/2013  . Premature atrial contractions 12/28/2013  . PSVT (paroxysmal supraventricular tachycardia) ([place]) 12/28/2013  . Heart palpitations 07/13/2013  . Sleep apnea 04/11/2013  . Scoliosis 04/11/2013  . Atrial flutter with rapid ventricular response ([place]) 11/29/2012  . Chest pain, atypical 11/29/2012  . Fibromyalgia syndrome 11/29/2012  . Chronic steroid use 11/29/2012    Mikle Bosworth PTA 01/26/2017, 5:21 PM  [facility] [address], [place], [place], [address] Phone: [phone]   Fax:  [phone]  Name: Janet Nguyen MRN: [id number] Date of Birth: 1932-05-12

## 2017-01-26 NOTE — Patient Instructions (Signed)
(  Clinic) PNF: D1 Extension - Unilateral    Opposite side toward pulley, right arm up, across body, thumb up, pull arm down across body, rotating to thumb down. Follow hand with head and eyes. Repeat __10__ times per set. Do __2__ sets per session. Do _3___ sessions per week.  Copyright  VHI. All rights reserved.   Resisted Horizontal Abduction: Bilateral    Sit or stand, tubing in both hands, arms out in front. Keeping arms straight, pinch shoulder blades together and stretch arms out. Repeat _10___ times per set. Do _2___ sets per session. Do __3_ sessions per day.  http://orth.exer.us/969   Copyright  VHI. All rights reserved.   Bilateral Front Arm Raise    Standing in neutral posture, on floor, raise both arms to shoulder height in front.  Do _10___ repetitions, __1__ sets.  Copyright  VHI. All rights reserved.   Mikle Bosworth, PTA 01/26/17 2:23 PM  [facility] [address] [place], [place] [address] Phone # [phone] Fax [phone]

## 2017-01-29 ENCOUNTER — Encounter: Payer: Self-pay | Admitting: Physical Therapy

## 2017-01-29 ENCOUNTER — Ambulatory Visit: Payer: Medicare Other | Attending: Orthopaedic Surgery | Admitting: Physical Therapy

## 2017-01-29 DIAGNOSIS — R293 Abnormal posture: Secondary | ICD-10-CM | POA: Insufficient documentation

## 2017-01-29 DIAGNOSIS — M542 Cervicalgia: Secondary | ICD-10-CM | POA: Diagnosis not present

## 2017-01-29 DIAGNOSIS — M4125 Other idiopathic scoliosis, thoracolumbar region: Secondary | ICD-10-CM | POA: Diagnosis not present

## 2017-01-29 DIAGNOSIS — M62830 Muscle spasm of back: Secondary | ICD-10-CM | POA: Insufficient documentation

## 2017-01-29 DIAGNOSIS — M6281 Muscle weakness (generalized): Secondary | ICD-10-CM | POA: Diagnosis not present

## 2017-01-29 DIAGNOSIS — M4123 Other idiopathic scoliosis, cervicothoracic region: Secondary | ICD-10-CM | POA: Diagnosis not present

## 2017-01-29 NOTE — Therapy (Signed)
[facility] [address], [place], [place], [address] Phone: [phone]   Fax:  [phone]  Physical Therapy Treatment  Patient Details  Name: Janet Nguyen MRN: [id number] Date of Birth: 30-Sep-1931 Referring Provider: Dr. Rennis Harding  Encounter Date: 01/29/2017      PT End of Session - 01/29/17 1643    Visit Number 3   Number of Visits 10   Date for PT Re-Evaluation 03/12/17   Authorization Type medicare g-code 10th visit; kx modifier on 15th visit   PT Start Time 1533   PT Stop Time 1614   PT Time Calculation (min) 41 min   Activity Tolerance Patient tolerated treatment well   Behavior During Therapy [facility] for tasks assessed/performed      Past Medical History:  Diagnosis Date  . Arrhythmia    History of SVT with documented PVC'S and  PAC'S  12/08/12 Nuc stress test normal LV EF 74%  Event Monitor  12/01/12-01/03/13  . Celiac disease    treated by Dr. [name]  . GERD (gastroesophageal reflux disease)   . Intervertebral disc stenosis of neural canal of cervical region   . Irregular heart beat 11/30/12   ECHO-EF 60-65%  . Osteoporosis   . Scoliosis   . Scoliosis   . Sleep apnea 10/02/11 [facility]   Sleep study AHI -total sleep 10.3/hr  64.0/ hr during REM sleep.RDI 22.8/hr during total sleep 64.0/hr during REM sleep The lowest O2 sat during Non-REM and REM sleep was 86% and 88% respectively. 04/08/12 CPAP/BIPAP titration study [facility]    Past Surgical History:  Procedure Laterality Date  . APPENDECTOMY     ruptured at age 4 and had surgery  . CARDIAC CATHETERIZATION  01/27/06    There were no vitals filed for this visit.      Subjective Assessment - 01/29/17 1539    Subjective (P)  Patient reports doing too much today and having increased pain. Felt relief from massage to back after last sesion, doing well with exercises at home.    Patient Stated Goals (P)   elongating trunk, decrease nexk pain, feel better and learn exercise, how to manage pain   Currently in Pain? (P)  Yes   Pain Score (P)  10-Worst pain ever   Pain Location (P)  Back   Pain Orientation (P)  Right;Left;Upper;Lower;Medial   Pain Descriptors / Indicators (P)  Aching;Dull;Sharp   Pain Type (P)  Chronic pain                         [facility] Adult PT Treatment/Exercise - 01/29/17 0001      Manual Therapy   Manual Therapy Soft tissue mobilization   Manual therapy comments Supine and right sidelying   Soft tissue mobilization Cirvical and Thoracic paraspinals, Left quadratus                  PT Short Term Goals - 01/26/17 1407      PT SHORT TERM GOAL #1   Title independent with postural correction exercises to elongate her trunk and separate rib cage from pelvis   Time 4   Period Weeks   Status On-going     PT SHORT TERM GOAL #2   Title understand what postures to avoid with osteoporosis to reduce pain and risk of spinal fractures   Time 4   Period Weeks   Status On-going  PT SHORT TERM GOAL #3   Title understand correct body mechanics with daily tasks and gardening to decreased pain in back   Time 4   Period Weeks   Status On-going     PT SHORT TERM GOAL #4   Title back and cervical pain decreased to moderate due to decreased strain during daily activities   Time 4   Period Weeks   Status On-going           PT Long Term Goals - 01/15/17 1258      PT LONG TERM GOAL #1   Title independent with postural strengthening exercises   Time 8   Period Weeks   Status New     PT LONG TERM GOAL #2   Title FOTO score </= 42% limitation   Time 8   Period Weeks   Status New     PT LONG TERM GOAL #3   Title back and cervical pain decreased to minimal during daily activities due to improve postural muscle strength   Time 8   Period Weeks   Status New     PT LONG TERM GOAL #4   Title ability to perform gardening and daily  activites with >/= 50% greater ease due to increased postural strength   Time 8   Period Weeks   Status New               Plan - 01/29/17 1712    Clinical Impression Statement Patient presents with 10/10 pain reporting she was rushing all day to get things done and trying to get here on time. Patient reaponded well to manual massage and wanting to work on thoracic and cervical spine. Patient reported much less pain after therapy session. Patient has had therapy and treatment for back pain for many years and is only interested in exercsies that she has never done before. Patient will continue to benefit from skilled thearpy for education on porper posture and self corective techniques as well as management of pain.    Rehab Potential Excellent   Clinical Impairments Affecting Rehab Potential osteoporosis; Celiac Disease; scoliosis   PT Frequency 2x / week   PT Duration 8 weeks   PT Treatment/Interventions Cryotherapy;Electrical Stimulation;Moist Heat;Ultrasound;Therapeutic activities;Therapeutic exercise;Neuromuscular re-education;Patient/family education;Manual techniques;Dry needling;Energy conservation;Taping   PT Next Visit Plan Soft tissue work, self corrective techniques   Consulted and Agree with Plan of Care Patient      Patient will benefit from skilled therapeutic intervention in order to improve the following deficits and impairments:  Decreased mobility, Decreased strength, Postural dysfunction, Impaired flexibility, Decreased activity tolerance, Pain, Increased muscle spasms, Decreased endurance, Increased fascial restricitons  Visit Diagnosis: Muscle weakness (generalized)  Abnormal posture  Other idiopathic scoliosis, cervicothoracic region  Other idiopathic scoliosis, thoracolumbar region  Cervicalgia  Muscle spasm of back     Problem List Patient Active Problem List   Diagnosis Date Noted  . Fatigue 12/23/2015  . Sciatica of right side 10/06/2015  .  History of migraine headaches 10/06/2015  . Frequent PVCs 12/28/2013  . Premature atrial contractions 12/28/2013  . PSVT (paroxysmal supraventricular tachycardia) ([place]) 12/28/2013  . Heart palpitations 07/13/2013  . Sleep apnea 04/11/2013  . Scoliosis 04/11/2013  . Atrial flutter with rapid ventricular response ([place]) 11/29/2012  . Chest pain, atypical 11/29/2012  . Fibromyalgia syndrome 11/29/2012  . Chronic steroid use 11/29/2012    Mikle Bosworth PTA 01/29/2017, 5:15 PM  [facility] [address], [place], [place],  [address] Phone: [phone]   Fax:  [phone]  Name: AISLEE LANDGREN MRN: [id number] Date of Birth: [birth year]

## 2017-02-01 DIAGNOSIS — M5489 Other dorsalgia: Secondary | ICD-10-CM | POA: Diagnosis not present

## 2017-02-04 DIAGNOSIS — R1010 Upper abdominal pain, unspecified: Secondary | ICD-10-CM | POA: Diagnosis not present

## 2017-02-09 ENCOUNTER — Ambulatory Visit (INDEPENDENT_AMBULATORY_CARE_PROVIDER_SITE_OTHER): Payer: Medicare Other | Admitting: Cardiovascular Disease

## 2017-02-09 ENCOUNTER — Encounter: Payer: Self-pay | Admitting: Cardiovascular Disease

## 2017-02-09 VITALS — BP 110/74 | HR 65 | Ht 64.0 in | Wt 110.0 lb

## 2017-02-09 DIAGNOSIS — M4125 Other idiopathic scoliosis, thoracolumbar region: Secondary | ICD-10-CM | POA: Diagnosis not present

## 2017-02-09 DIAGNOSIS — I471 Supraventricular tachycardia, unspecified: Secondary | ICD-10-CM

## 2017-02-09 DIAGNOSIS — I10 Essential (primary) hypertension: Secondary | ICD-10-CM | POA: Diagnosis not present

## 2017-02-09 DIAGNOSIS — R002 Palpitations: Secondary | ICD-10-CM

## 2017-02-09 DIAGNOSIS — R0789 Other chest pain: Secondary | ICD-10-CM

## 2017-02-09 DIAGNOSIS — G4733 Obstructive sleep apnea (adult) (pediatric): Secondary | ICD-10-CM | POA: Diagnosis not present

## 2017-02-09 NOTE — Progress Notes (Signed)
Patient ID: Janet Nguyen, female   DOB: 1931/11/27, 83 y.o.   MRN: [id number]     Primary  M.D.: Dr. Lona Kettle  HPI: Janet Nguyen is a 83 y.o. female who presents for a 2 month followup cardiology evaluation.   Janet Nguyen has a history of documented SVT and also has PACs and PVCs  treated with beta blocker therapy. In April 2014 I further titrated her beta blocker therapy after cardiac event monitor revealed several bursts of recurrent SVT up to 177 beats per minute in March.  In July after she had had 2 episodes of chest fluttering which each lasted over 30 minutes which he did take metoprolol tartrate with relief I recommended further titration of her Toprol to 75 mg in the morning and 50 mg at night and if necessary she could further titrate this to 75 twice a day.  She has a history of obstructive sleep apnea but despite multiple attempts at CPAP utilization she has not been able to tolerate this. She was referred to Dr. [name] and has a customized dental appliance with mandibular advancement with improvement in some of her symptomatology. Due concern that her teeth may be moving in more recently she has has not been using customized appliance daily but has been using her old non-customized mouthguard.  At times shen has awakened abruptly from a dream with her heart pounding and a sensation of hot flashes, gasping for breath.   She also states that her scoliosis is getting worse. She does have left-sided musculoskeletal type chest pain due to her spine angulation.  Beause of her significant scoliosis, she feels she must sleep on her back.   She has a history of GERD for which she has been taking Nexium.  She presents for evaluation.  I had scheduled her for an overnight oximetry to see if she is a candidate for supplemental oxygen at nighttime since she refused to use CPAP and only very rarely uses her customized mouthpiece.  She does wear a mouthguard to reduce bruxism.  Oximetry  study was performed overnight on February 23/24.  Her mean oxygen saturation was 93.12%.  She spent 12 minutes and 16 seconds with O2 sat duration below 88% with the lowest O2 sat duration of 80%. I tried to set her up for supplemental oxygen at bedtime.  However, she has been denied for this on multiple occasions by her insurance/Medicare.  She feels that she is sleeping better.  She can only sleep in her back due to scoliosis.  Her left side is concave; her right side is convex.  She has had issues with labile blood pressure. She has had issues with lower back discomfort and sciatica leading to emergency room evaluation in November 2016.  She saw Dr. [name] for neurosurgical evaluation. She has migraine headaches. She  Recently saw Dr. [name] for these migraine headaches.  She has been on Toprol-XL 75 mg twice a day for her palpitations and presently denies any awareness an extra heartbeats. She takes Xanax on an as-needed basis for anxiety.  She has been taking Nexium 20, no grams every other day for GERD. She states her scoliosis is getting worse.  Has been experiencing more fatigue.  She also notes some occasional left hand numbness.  In a mouth guard but no ureteral longer uses her mandibular advancement device and not tolerate CPAP therapy for her obstructive sleep apnea.    When I saw her earlier this year she was complaining of  significant increased fatigability.  She had experienced left-sided chest discomfort which most likely was related to her significant scoliosis the potential neuropathy causing intermittent left arm and hand numbness.  I slightly reduced her Toprol-XL which she had been taking 150 mg daily and a 75 twice a day regimen to 75 and milligrams in the morning and 50 mg with ultimate plan to decrease this to 50 twice a day.  She states when she reduce this, she began to notice more optical migraines and resume taking the higher dose Toprol at 75 twice a day with improvement.  She has  seen Dr. [name] for her paresthesias.  She admits to significant anxiety.  She had requested zolpidem for sleep initiation and maintenance.  In the past, we had tried 6.25 slow-release version to help with sleep maintenance, but due to cost issues preferred the 5 mg sleep initiation dose.  She experiences optical migraine headaches.  She was being seen by neurologist, Dr. Melton Alar  She continues to have issues with her scoliosis causing discomfort in her neck and arm due to her distortion.  He tells me she underwent endoscopy and colonoscopy as well as banding of hemorrhoids.  She had a benign polyp removed.  She has issues with spastic colon.  Her sleep is better with zolpiden 5 mg.  She saw Kerin Ransom on 11/13/2016.  She had noticed that her hair was "thinning" and was concerned about this being the result of Toprol.  He suggested possibly decreasing Toprol but she preferred not until she had seen me.  Since that time, she continues to experience some optical migraines.  She has noticed some leg left neck discomfort.  She denies any patches of hair loss.  She denies differential arm weakness.  She continues to have difficulty with her scoliosis with hip pain and rib cage discomfort.   When I last saw her in March 2018, there was a blood pressure differential of 118/78 in the left arm and 140/80 in the right arm.  She subsequently underwent upper extremity Doppler evaluation on 12/25/2016.  She was noted have mild heterogeneous plaque with stable 139% bilateral internal carotid stenoses.  She had normal subclavian arteries bilaterally.  There are patent vertebral arteries with antegrade flow.  She did not have any significant blood pressure differential and had triphasic waveforms in both her right and biphasic in her left brachial artery.  Left blood pressure was 8 mm higher than the right brachial pressure.  At times, she continues to experience some vague chest wall symptoms, which most likely related to  her posture.  She denies significant shortness of breath.  She has to sleep on her back because of her scoliosis.  Uses a chin strap to prevent oral breathing.  She is not on CPAP.   Past Medical History:  Diagnosis Date  . Arrhythmia    History of SVT with documented PVC'S and  PAC'S  12/08/12 Nuc stress test normal LV EF 74%  Event Monitor  12/01/12-01/03/13  . Celiac disease    treated by Dr. [name]  . GERD (gastroesophageal reflux disease)   . Intervertebral disc stenosis of neural canal of cervical region   . Irregular heart beat 11/30/12   ECHO-EF 60-65%  . Osteoporosis   . Scoliosis   . Scoliosis   . Sleep apnea 10/02/11 [facility]   Sleep study AHI -total sleep 10.3/hr  64.0/ hr during REM sleep.RDI 22.8/hr during total sleep 64.0/hr during REM sleep The lowest O2  sat during Non-REM and REM sleep was 86% and 88% respectively. 04/08/12 CPAP/BIPAP titration study [facility]    Past Surgical History:  Procedure Laterality Date  . APPENDECTOMY     ruptured at age 50 and had surgery  . CARDIAC CATHETERIZATION  01/27/06    Allergies  Allergen Reactions  . Gluten Meal     Unknown  . Codeine Nausea Only    Current Outpatient Prescriptions  Medication Sig Dispense Refill  . acetaminophen (TYLENOL) 500 MG tablet Take 500 mg by mouth as needed for pain.    Marland Kitchen ALPRAZolam (XANAX) 0.25 MG tablet Take 0.25 mg by mouth as needed for anxiety.     Marland Kitchen amLODipine (NORVASC) 2.5 MG tablet Take 1 tablet (2.5 mg total) by mouth daily. 90 tablet 1  . BL EVENING PRIMROSE OIL PO Take by mouth.    . clidinium-chlordiazePOXIDE (LIBRAX) 5-2.5 MG capsule Take 1 capsule by mouth.    . FIBER COMPLETE PO Take 1 tablet by mouth daily.    . metoprolol succinate (TOPROL-XL) 50 MG 24 hr tablet Take 1 and 1/2 tablets ( total 75 mg) by mouth twice  daily 270 tablet 3  . Multiple Vitamin (MULTIVITAMIN WITH MINERALS) TABS Take 2 tablets by mouth daily.     Marland Kitchen  Phenylephrine-Acetaminophen (EQL SINUS CONGESTION/PAIN DAY PO) Take by mouth as needed.    . Wheat Dextrin (EQ FIBER POWDER PO) Take by mouth.    . zolpidem (AMBIEN) 5 MG tablet Take 1 tablet (5 mg total) by mouth at bedtime as needed. for sleep 30 tablet 1   No current facility-administered medications for this visit.     Socially she is divorced has 4 children 9 grandchildren. She does exercise. No tobacco use. She does occasional wine.  ROS General: Negative; No fevers, chills, or night sweats;  HEENT: Negative; No changes in vision or hearing, sinus congestion, difficulty swallowing Pulmonary: Negative; No cough, wheezing, shortness of breath, hemoptysis Cardiovascular: Positive for occasional chest wall pain and nocturnal palpitations GI: Negative; No nausea, vomiting, diarrhea, or abdominal pain GU: Negative; No dysuria, hematuria, or difficulty voiding Musculoskeletal: Positive for significant scoliosis; fibromyalgia;  joint pain, or weakness Hematologic/Oncology: Negative; no easy bruising, bleeding Endocrine: Negative; no heat/cold intolerance; no diabetes Neuro: Negative; no changes in balance, headaches Skin: Negative; No rashes or skin lesions Psychiatric: Negative; No behavioral problems, depression Sleep: Positive for sleep apnea ; No snoring, daytime sleepiness, hypersomnolence, bruxism, restless legs, hypnogognic hallucinations, no cataplexy Other comprehensive 14 point system review is negative.  PE BP 110/74 (BP Location: Left Arm, Patient Position: Sitting, Cuff Size: Normal)   Pulse 65   Ht _0  (1.626 m)   Wt 110 lb (49.9 kg)   BMI 18.88 kg/m    Repeat BP by me today was 138/76 in the right arm and 142/78 in the left arm.  Wt Readings from Last 3 Encounters:  02/09/17 110 lb (49.9 kg)  12/03/16 109 lb (49.4 kg)  11/13/16 109 lb (49.4 kg)   General: Alert, oriented, no distress.  Skin: normal turgor, no rashes HEENT: Normocephalic, atraumatic. Pupils  round and reactive; sclera anicteric;no lid lag.  Nose without nasal septal hypertrophy Mouth/Parynx benign; Mallinpatti scale 3  Neck: No JVD, no carotid bruits without carotid upstroke Lungs: clear to ausculatation and percussion; no wheezing or rales Chest wall: Mild tenderness to palpation in the lateral left pectoral region/rib. She does have significant scoliosis.  Mild tenderness over the sternal notch and there does  appear to be a small cyst over the left lateral aspect. Heart: RRR, s1 s2 normal 1/6 systolic murmur; no ectopy on prolonged auscultation Abdomen: soft, nontender; no hepatosplenomehaly, BS+; abdominal aorta nontender and not dilated by palpation. Back: No CVA tenderness. Pulses 2+ Extremities: no clubbing cyanosis or edema, Homan's sign negative  Neurologic: grossly nonfocal Psychological: Normal affect and mood  ECG (independently read by me): Normal sinus rhythm at 65 bpm.  QS V1, V2.  Normal intervals.  No ST segment changes.  March 2018 ECG (independently read by me): Normal sinus rhythm at 65 bpm.  No ectopy.  Normal intervals.  September 2017 ECG (independently read by me): Normal sinus rhythm at 62 bpm.  QS V1 and V2.  Normal intervals.  April 2017 ECG (independently read by me): Normal sinus rhythm at 70 bpm.  No ectopy.  PR interval 158 ms and QTc interval 414 ms.  March 2017 ECG (independently read by me): normal sinus rhythm at 61 bpm..  No ST segment changes.  Normal intervals.  QTc interval 396 ms.  August 2014 ECG (independently read by me): Normal sinus rhythm at 63 bpm.  QRS couplets V1 V2.  No cigarette ST-T changes.  May 2016 ECG (independently read by me): Sinus bradycardia 59 bpm.  No ectopy.  February 2016 ECG (independently read by me): Sinus bradycardia 56 bpm.  Normal intervals.  Prior ECG (independently read by me): Normal sinus rhythm at 62 beats per minute.  QTc interval 411 milliseconds.  QRS complex V1, V2.  Normal intervals.  ECG  (independently read by me): Normal sinus rhythm at 62 beats per minute. Normal intervals. QTc interval 399 ms.  Prior ECG of 07/13/2013: Sinus rhythm at 61 beats per minute. QTc interval 406 ms. PR interval normal at 168 ms.  LABS: BMP Latest Ref Rng & Units 12/03/2016 08/19/2015 12/04/2014  Glucose 65 - 99 mg/dL 103(H) 113(H) 106(H)  BUN 7 - 25 mg/dL _0 Creatinine 0.60 - 0.88 mg/dL 0.85 0.86 0.86  Sodium 135 - 146 mmol/L 139 132(L) 138  Potassium 3.5 - 5.3 mmol/L 4.5 3.8 4.0  Chloride 98 - 110 mmol/L 102 98(L) 102  CO2 20 - 31 mmol/L _1 Calcium 8.6 - 10.4 mg/dL 9.8 9.8 9.9   Hepatic Function Latest Ref Rng & Units 12/03/2016 12/04/2014 05/11/2010  Total Protein 6.1 - 8.1 g/dL 7.1 7.4 7.2  Albumin 3.6 - 5.1 g/dL 4.4 4.5 3.3(L)  AST 10 - 35 U/L 30 37 18  ALT 6 - 29 U/L _2 Alk Phosphatase 33 - 130 U/L 55 53 66  Total Bilirubin 0.2 - 1.2 mg/dL 0.7 0.7 0.6   CBC Latest Ref Rng & Units 12/03/2016 08/19/2015 12/04/2014  WBC 3.8 - 10.8 K/uL 8.4 15.0(H) 9.9  Hemoglobin 11.7 - 15.5 g/dL 13.1 13.4 14.0  Hematocrit 35.0 - 45.0 % 39.9 39.5 41.1  Platelets 140 - 400 K/uL 313 231 323   Lab Results  Component Value Date   TSH 1.07 12/03/2016  Lipid Panel     Component Value Date/Time   CHOL 154 12/03/2016 0845   TRIG 81 12/03/2016 0845   HDL 67 12/03/2016 0845   CHOLHDL 2.3 12/03/2016 0845   VLDL 16 12/03/2016 0845   LDLCALC 71 12/03/2016 0845     RADIOLOGY: No results found.  IMPRESSION:  1. OSA (obstructive sleep apnea)   2. Essential hypertension, benign   3. Heart palpitations   4. Atypical chest pain  5. PSVT (paroxysmal supraventricular tachycardia) ([facility])   6. Other idiopathic scoliosis, thoracolumbar region     ASSESSMENT AND PLAN: Janet Nguyen is an 83 year old female who has a history of documented SVT and PACs/PVCs which have been controlled with beta blocker therapy. She has documented normal systolic function on her echo in March 2014 with grade 1  diastolic dysfunction and had  significant left atrial dilatation and mild dilatation of the right ventricle with moderate dilatation of the right atrium. She has mild/moderate pulmonary hypertension with PA pressures of 42 mm. Her palpitations have resolved on  Toprol-XL 75 mg twice a day.  She is sleeping significantly better with her new chin strap and old mouth guard.  She previously had a customized oral appliance by Dr. Oneal Grout, but due to discomfort stopped using this.  She never could tolerate any trial of CPAP therapy. She had experienced migraine headaches and recently underwent neurologic evaluation. She has also experienced left-sided chest discomfort which may be related to her significant scoliosis and potential neuropathy causing intermittent left arm, hand numbness.  I reviewed her most recent carotid studies with her, which showed mild plaque bilaterally in the 139% range.  She had normal subclavian arteries and antegrade flow in both vertebral arteries.  She did not have any significant blood pressure differential.  I reviewed laboratory.  Thyroid function studies remained normal.  Her fasting glucose was minimally increased at 103.  Lipid studies continue to be excellent with a total cholesterol 154, LDL 71, triglycerides 81, and HDL 67.  Her blood pressure today is controlled on low-dose amlodipine at 2.5 mg and her Toprol-XL 75 mg twice a day.  She has a prescription for metoprolol, tartrate to take on an as-needed basis.  If recurrent tachycardia dysrhythmias occur.  I suspect some of her apical chest sensation is most likely intercostal muscle fasciculations.  She continues to have issues with her scoliosis involving her back.  She is stable from a cardiovascular standpoint.  She prefers to be seen in at least 6 months for cardiology evaluation.  Time spent: 25 minutes  Troy Sine, MD, [facility]  02/09/2017 2:33 PM

## 2017-02-09 NOTE — Patient Instructions (Signed)
Medication Instructions: No changes  Follow-Up: Your physician wants you to follow-up in: 6 months with Dr. [name]. You will receive a reminder letter in the mail two months in advance. If you don't receive a letter, please call our office to schedule the follow-up appointment.   If you need a refill on your cardiac medications before your next appointment, please call your pharmacy.

## 2017-02-19 ENCOUNTER — Ambulatory Visit: Payer: Medicare Other | Admitting: Physical Therapy

## 2017-02-19 DIAGNOSIS — M6281 Muscle weakness (generalized): Secondary | ICD-10-CM | POA: Diagnosis not present

## 2017-02-19 DIAGNOSIS — M4123 Other idiopathic scoliosis, cervicothoracic region: Secondary | ICD-10-CM | POA: Diagnosis not present

## 2017-02-19 DIAGNOSIS — M4125 Other idiopathic scoliosis, thoracolumbar region: Secondary | ICD-10-CM

## 2017-02-19 DIAGNOSIS — M62830 Muscle spasm of back: Secondary | ICD-10-CM

## 2017-02-19 DIAGNOSIS — M542 Cervicalgia: Secondary | ICD-10-CM

## 2017-02-19 DIAGNOSIS — R293 Abnormal posture: Secondary | ICD-10-CM

## 2017-02-19 NOTE — Therapy (Addendum)
[facility] [address] [place], [place], [address] Phone: [phone]   Fax:  [phone]  Physical Therapy Treatment  Patient Details  Name: Janet Nguyen MRN: [id number] Date of Birth: 05-10-1932 Referring Provider: Dr. Rennis Harding  Encounter Date: 02/19/2017      PT End of Session - 02/19/17 1112    Visit Number 4   Number of Visits 10   Date for PT Re-Evaluation 03/12/17   Authorization Type medicare g-code 10th visit; kx modifier on 15th visit   PT Start Time 1102   PT Stop Time 1141   PT Time Calculation (min) 39 min   Activity Tolerance Patient tolerated treatment well   Behavior During Therapy [facility] for tasks assessed/performed      Past Medical History:  Diagnosis Date  . Arrhythmia    History of SVT with documented PVC'S and  PAC'S  12/08/12 Nuc stress test normal LV EF 74%  Event Monitor  12/01/12-01/03/13  . Celiac disease    treated by Dr. [name]  . GERD (gastroesophageal reflux disease)   . Intervertebral disc stenosis of neural canal of cervical region   . Irregular heart beat 11/30/12   ECHO-EF 60-65%  . Osteoporosis   . Scoliosis   . Scoliosis   . Sleep apnea 10/02/11 [facility]   Sleep study AHI -total sleep 10.3/hr  64.0/ hr during REM sleep.RDI 22.8/hr during total sleep 64.0/hr during REM sleep The lowest O2 sat during Non-REM and REM sleep was 86% and 88% respectively. 04/08/12 CPAP/BIPAP titration study [facility]    Past Surgical History:  Procedure Laterality Date  . APPENDECTOMY     ruptured at age 73 and had surgery  . CARDIAC CATHETERIZATION  01/27/06    There were no vitals filed for this visit.                       [facility] PT Treatment/Exercise - 02/19/17 0001      Lumbar Exercises: Stretches   Lower Trunk Rotation --  Standing upper trunk rotation   Press Ups --  Standing reaching over head against door      Manual Therapy   Manual Therapy Soft tissue mobilization   Manual therapy comments Right sidelying; pillow under hip, pillow between knees; 3 pillows under head   Soft tissue mobilization Cirvical and Thoracic paraspinals, Left quadratus      g-Code: Functional Assessment tool used FOTO score is 48% limitation; Functional limitation is mobility: walking and moving around; Goal is CK and discharge status is CK.  Earlie Counts, PT 03/09/17 10:10 AM              PT Short Term Goals - 02/19/17 1207      PT SHORT TERM GOAL #1   Title independent with postural correction exercises to elongate her trunk and separate rib cage from pelvis   Status Achieved     PT SHORT TERM GOAL #2   Title understand what postures to avoid with osteoporosis to reduce pain and risk of spinal fractures   Status Achieved     PT SHORT TERM GOAL #3   Title understand correct body mechanics with daily tasks and gardening to decreased pain in back   Status Achieved     PT SHORT TERM GOAL #4   Title back and cervical pain decreased to moderate due to decreased strain during daily activities   Status On-going  PT Long Term Goals - 02/19/17 1208      PT LONG TERM GOAL #1   Title independent with postural strengthening exercises   Status On-going     PT LONG TERM GOAL #2   Title FOTO score </= 42% limitation   Status On-going     PT LONG TERM GOAL #3   Title back and cervical pain decreased to minimal during daily activities due to improve postural muscle strength   Status On-going     PT LONG TERM GOAL #4   Title ability to perform gardening and daily activites with >/= 50% greater ease due to increased postural strength   Status On-going               Plan - 02/19/17 1156    Clinical Impression Statement Patient presents with intense pain through whole back due to scoliosis. Patient reports she did very well after last therapy session and could walk straighter for a  while. Able to tolerate a few stretches before manual soft tissue mobilization. Patient responded very well to soft tissue mobilization again and felt much better after treatment. Patient will continue to benefit from skilled thearpy for management of pain and gentle spinal strengthening and stretching.    Rehab Potential Excellent   Clinical Impairments Affecting Rehab Potential osteoporosis; Celiac Disease; scoliosis   PT Frequency 2x / week   PT Duration 8 weeks   PT Treatment/Interventions Cryotherapy;Electrical Stimulation;Moist Heat;Ultrasound;Therapeutic activities;Therapeutic exercise;Neuromuscular re-education;Patient/family education;Manual techniques;Dry needling;Energy conservation;Taping   PT Next Visit Plan Soft tissue work, self corrective techniques   Consulted and Agree with Plan of Care Patient      Patient will benefit from skilled therapeutic intervention in order to improve the following deficits and impairments:  Decreased mobility, Decreased strength, Postural dysfunction, Impaired flexibility, Decreased activity tolerance, Pain, Increased muscle spasms, Decreased endurance, Increased fascial restricitons  Visit Diagnosis: Muscle weakness (generalized)  Abnormal posture  Other idiopathic scoliosis, thoracolumbar region  Other idiopathic scoliosis, cervicothoracic region  Cervicalgia  Muscle spasm of back     Problem List Patient Active Problem List   Diagnosis Date Noted  . Fatigue 12/23/2015  . Sciatica of right side 10/06/2015  . History of migraine headaches 10/06/2015  . Frequent PVCs 12/28/2013  . Premature atrial contractions 12/28/2013  . PSVT (paroxysmal supraventricular tachycardia) ([place]) 12/28/2013  . Heart palpitations 07/13/2013  . Sleep apnea 04/11/2013  . Scoliosis 04/11/2013  . Atrial flutter with rapid ventricular response ([place]) 11/29/2012  . Chest pain, atypical 11/29/2012  . Fibromyalgia syndrome 11/29/2012  . Chronic steroid use  11/29/2012    Jeanie Sewer PTA 02/19/2017, 12:09 PM  [facility] [address] [place], [place], [address] Phone: [phone]   Fax:  [phone]  Name: Janet Nguyen MRN: [id number] Date of Birth: Sep 28, 1932 PHYSICAL THERAPY DISCHARGE SUMMARY  Visits from Start of Care: 4  Current functional level related to goals / functional outcomes: See above. Patient has not returned since last visit on 02/19/2017.    Remaining deficits: See above.    Education / Equipment: HEP Plan:                                                    Patient goals were not met. Patient is being discharged due to not returning since the last visit.  Thank you for the referral. Earlie Counts, PT 03/09/17 10:12 AM  ?????

## 2017-02-25 ENCOUNTER — Encounter: Payer: Medicare Other | Admitting: Physical Therapy

## 2017-02-26 DIAGNOSIS — M6740 Ganglion, unspecified site: Secondary | ICD-10-CM | POA: Diagnosis not present

## 2017-02-26 DIAGNOSIS — B351 Tinea unguium: Secondary | ICD-10-CM | POA: Diagnosis not present

## 2017-02-26 DIAGNOSIS — N762 Acute vulvitis: Secondary | ICD-10-CM | POA: Diagnosis not present

## 2017-02-26 DIAGNOSIS — M545 Low back pain: Secondary | ICD-10-CM | POA: Diagnosis not present

## 2017-02-26 DIAGNOSIS — G479 Sleep disorder, unspecified: Secondary | ICD-10-CM | POA: Diagnosis not present

## 2017-02-26 DIAGNOSIS — M353 Polymyalgia rheumatica: Secondary | ICD-10-CM | POA: Diagnosis not present

## 2017-02-26 DIAGNOSIS — M419 Scoliosis, unspecified: Secondary | ICD-10-CM | POA: Diagnosis not present

## 2017-03-03 DIAGNOSIS — L609 Nail disorder, unspecified: Secondary | ICD-10-CM | POA: Diagnosis not present

## 2017-03-09 DIAGNOSIS — R51 Headache: Secondary | ICD-10-CM | POA: Diagnosis not present

## 2017-03-09 DIAGNOSIS — H04123 Dry eye syndrome of bilateral lacrimal glands: Secondary | ICD-10-CM | POA: Diagnosis not present

## 2017-03-09 DIAGNOSIS — M3501 Sicca syndrome with keratoconjunctivitis: Secondary | ICD-10-CM | POA: Diagnosis not present

## 2017-03-09 DIAGNOSIS — H40013 Open angle with borderline findings, low risk, bilateral: Secondary | ICD-10-CM | POA: Diagnosis not present

## 2017-03-20 DIAGNOSIS — M353 Polymyalgia rheumatica: Secondary | ICD-10-CM | POA: Diagnosis not present

## 2017-03-20 DIAGNOSIS — I10 Essential (primary) hypertension: Secondary | ICD-10-CM | POA: Diagnosis not present

## 2017-03-20 DIAGNOSIS — M545 Low back pain: Secondary | ICD-10-CM | POA: Diagnosis not present

## 2017-03-20 DIAGNOSIS — K219 Gastro-esophageal reflux disease without esophagitis: Secondary | ICD-10-CM | POA: Diagnosis not present

## 2017-03-20 DIAGNOSIS — Z0000 Encounter for general adult medical examination without abnormal findings: Secondary | ICD-10-CM | POA: Diagnosis not present

## 2017-03-20 DIAGNOSIS — M419 Scoliosis, unspecified: Secondary | ICD-10-CM | POA: Diagnosis not present

## 2017-03-20 DIAGNOSIS — M810 Age-related osteoporosis without current pathological fracture: Secondary | ICD-10-CM | POA: Diagnosis not present

## 2017-03-20 DIAGNOSIS — D729 Disorder of white blood cells, unspecified: Secondary | ICD-10-CM | POA: Diagnosis not present

## 2017-03-20 DIAGNOSIS — G479 Sleep disorder, unspecified: Secondary | ICD-10-CM | POA: Diagnosis not present

## 2017-03-23 IMAGING — MR MR HEAD WO/W CM
12 series · 45 of 48 positions shown · IV contrast (multihance)
Comparison: Head CT 12/04/2014. 12/06/2002 brain MRI report (images
not available).

CLINICAL DATA: Headache/optical migraines.  Visual auras.

Creatinine was obtained on site at [HOSPITAL] at [HOSPITAL].
Results: Creatinine 0.7 mg/dL.
EXAM:
MRI HEAD WITHOUT AND WITH CONTRAST
TECHNIQUE: Multiplanar, multiecho pulse sequences of the brain and surrounding
structures were obtained without and with intravenous contrast.
CONTRAST:  10mL MULTIHANCE GADOBENATE DIMEGLUMINE 529 MG/ML IV SOLN

[Series 3: t1_se_sag · sagittal · 5.0mm · 0.45mm/px · 3 of 21 slices shown]
[im 1/21]
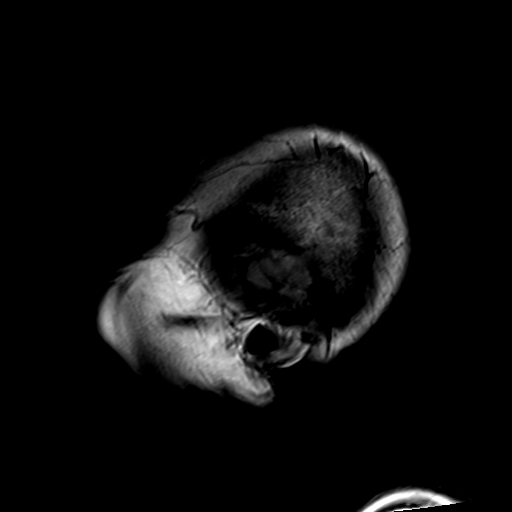
[im 11/21]
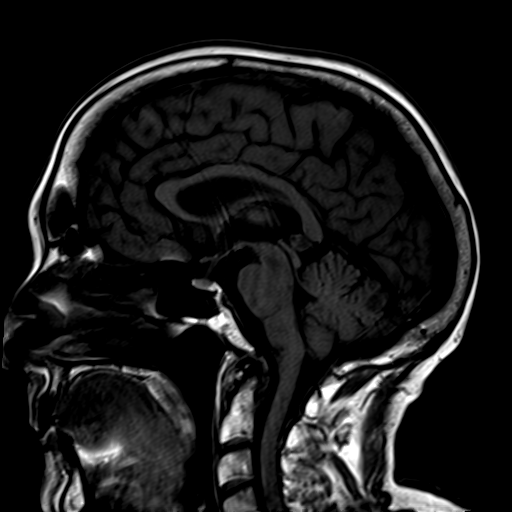
[im 21/21]
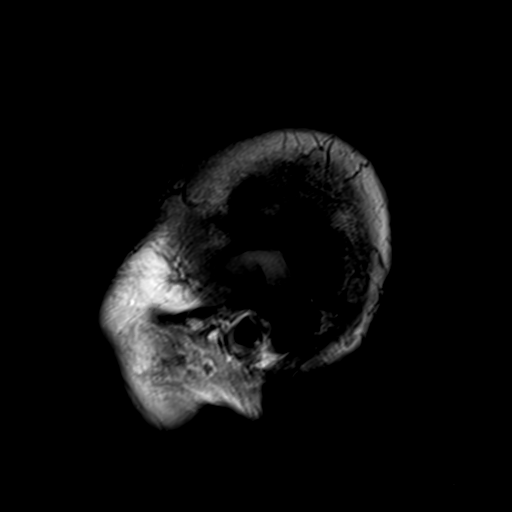

[Series 4: ep2d_diff_(id)_trace · axial · 3.0mm · 1.80mm/px · z∈[-59,+81]mm · 8 of 99 slices shown]
[im 1/99]
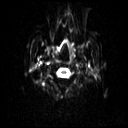
[im 15/99]
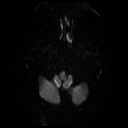
[im 29/99]
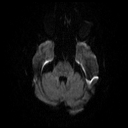
[im 43/99]
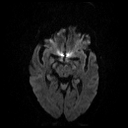
[im 57/99]
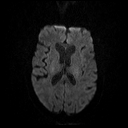
[im 71/99]
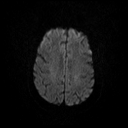
[im 85/99]
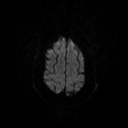
[im 99/99]
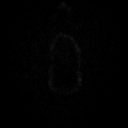

[Series 5: ep2d_diff_(id)_trace_adc · axial · 3.0mm · 1.80mm/px · z∈[-59,+81]mm · 4 of 50 slices shown]
[im 1/50]
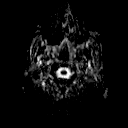
[im 17/50]
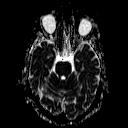
[im 33/50]
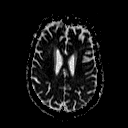
[im 50/50]
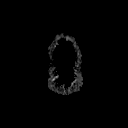

[Series 6: ep2d_diff_cor · coronal · 5.0mm · 1.77mm/px · 4 of 48 slices shown]
[im 1/48]
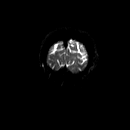
[im 16/48]
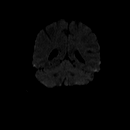
[im 32/48]
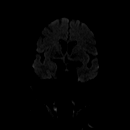
[im 48/48]
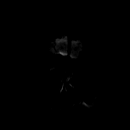

[Series 7: ep2d_diff_cor_adc · coronal · 5.0mm · 1.77mm/px · 2 of 25 slices shown]
[im 1/25]
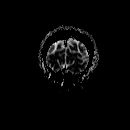
[im 25/25]
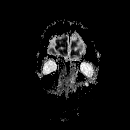

[Series 9: swi_images · axial · 2.0mm · 0.90mm/px · z∈[-65,+85]mm · 7 of 80 slices shown]
[im 1/80]
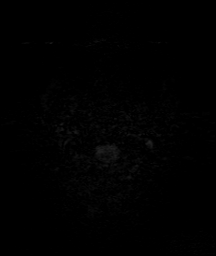
[im 14/80]
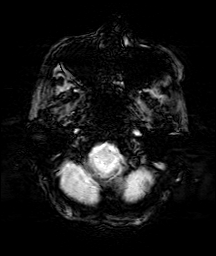
[im 27/80]
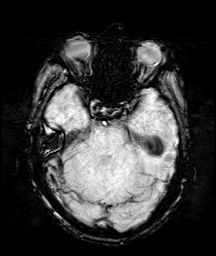
[im 40/80]
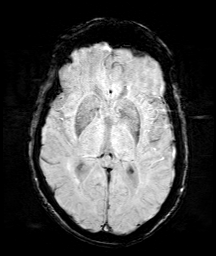
[im 53/80]
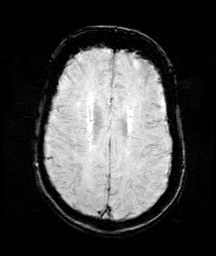
[im 66/80]
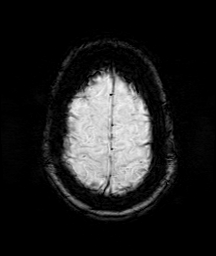
[im 80/80]
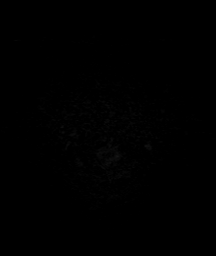

[Series 10: FLAIR · axial · 5.0mm · 0.45mm/px · z∈[-54,+76]mm · 2 of 23 slices shown]
[im 1/23]
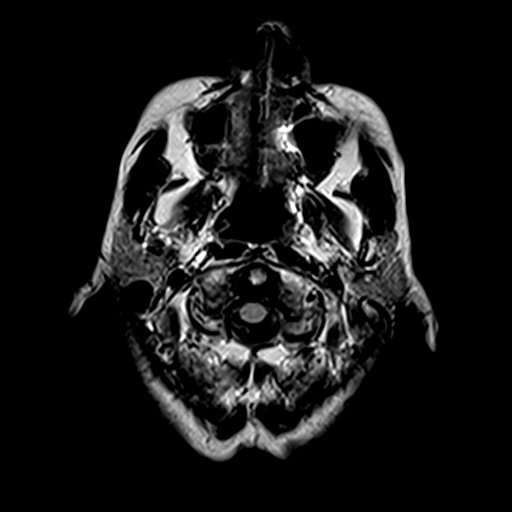
[im 23/23]
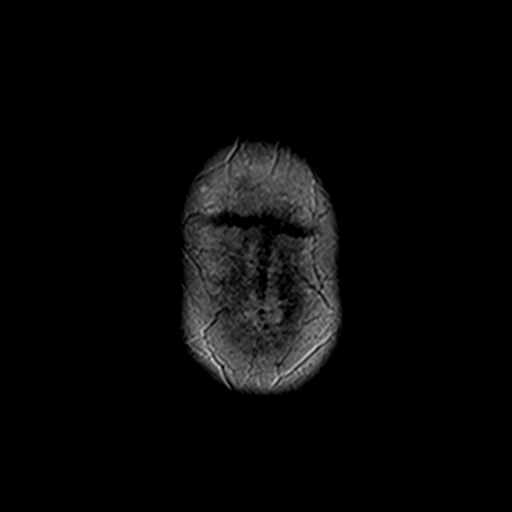

[Series 11: t2_tse_tra_512 · axial · 5.0mm · 0.60mm/px · z∈[-55,+75]mm · 2 of 23 slices shown]
[im 1/23]
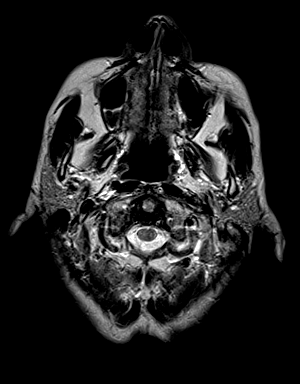
[im 23/23]
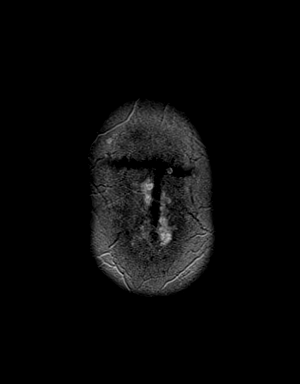

[Series 12: t1_mpr_tra · axial · 2.0mm · 0.45mm/px · z∈[-57,+78]mm · 6 of 72 slices shown]
[im 1/72]
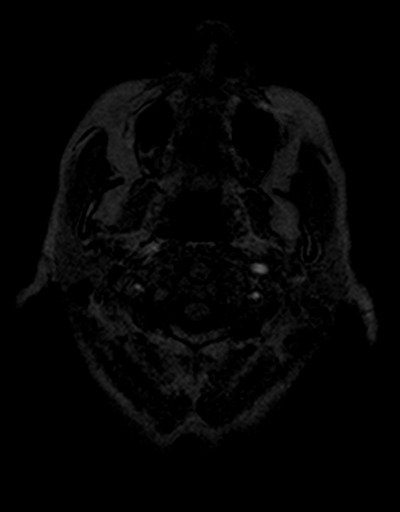
[im 15/72]
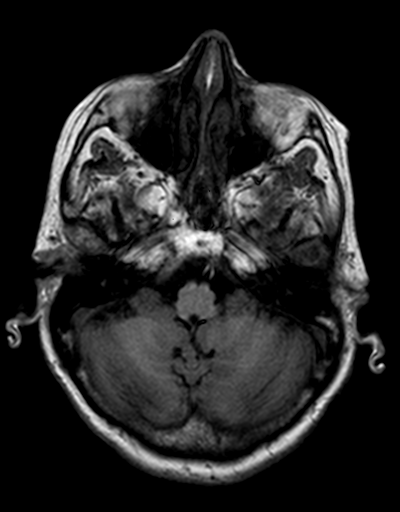
[im 29/72]
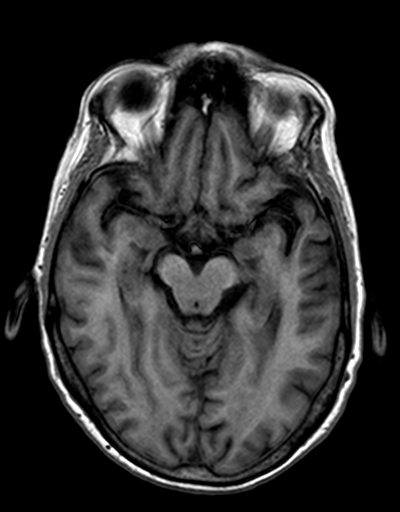
[im 43/72]
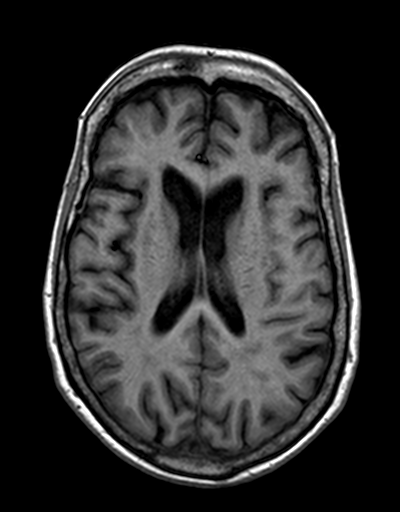
[im 57/72]
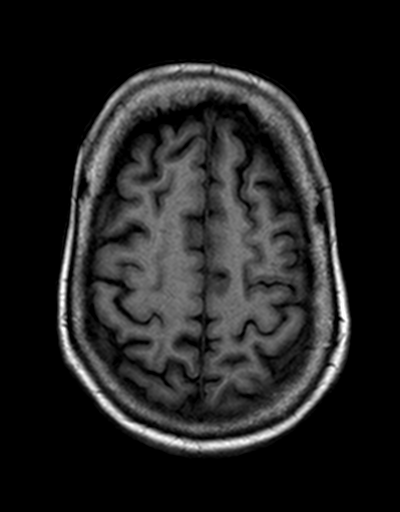
[im 72/72]
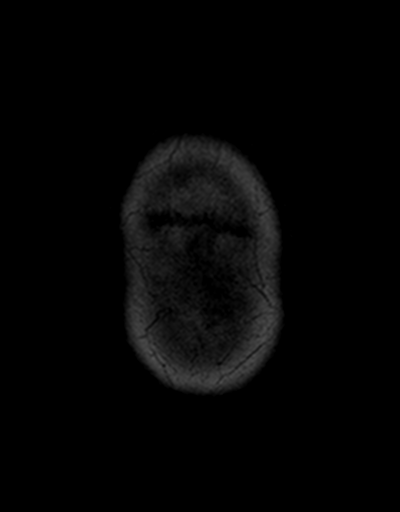

[Series 13: T2 · coronal · 5.0mm · 0.45mm/px · 2 of 26 slices shown]
[im 1/26]
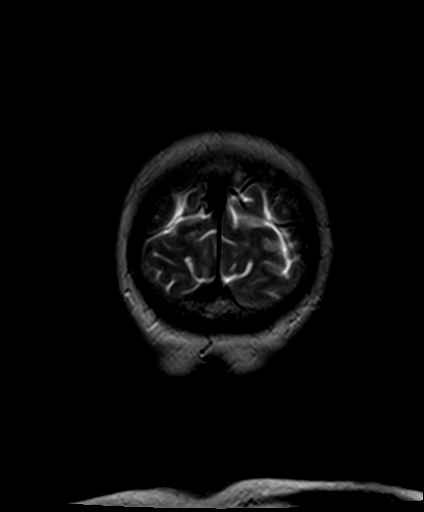
[im 26/26]
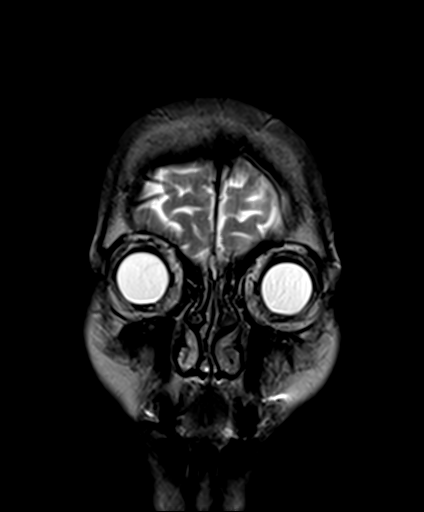

[Series 14: post cor · coronal · 5.0mm · 0.72mm/px · 2 of 26 slices shown]
[im 1/26]
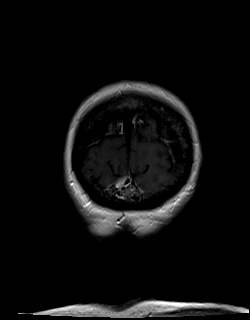
[im 26/26]
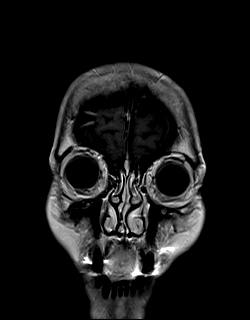

[Series 15: post t1_mpr_tra · axial · 2.0mm · 0.45mm/px · z∈[-57,-4]mm · 3 of 72 slices shown]
[im 1/72]
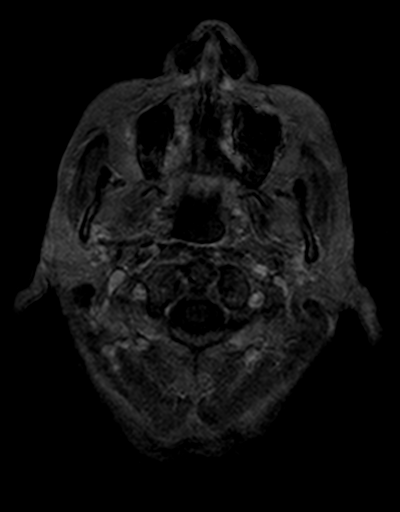
[im 15/72]
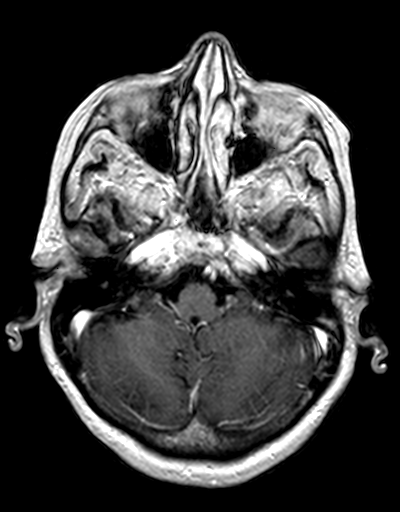
[im 29/72]
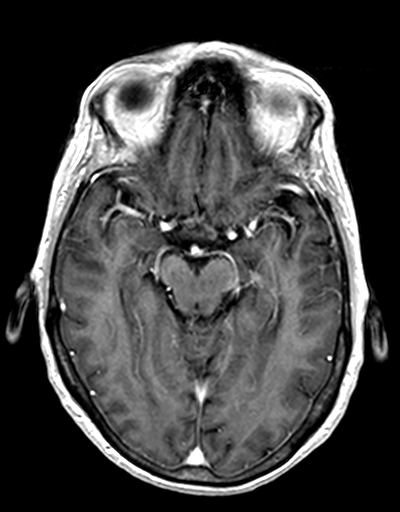

[45 of 48 positions shown; findings below may reference images not displayed]

FINDINGS: There is no evidence of acute infarct, intracranial hemorrhage,
mass, midline shift, or extra-axial fluid collection. Ventricles and
sulci are normal for age. Foci of T2 hyperintensity in the
subcortical and deep cerebral white matter are nonspecific but
compatible with mild chronic small vessel ischemic disease. A
subcentimeter pineal cyst is again noted without significant mass
effect.

Prior bilateral cataract extraction is noted. Paranasal sinuses and
mastoid air cells are clear. Major intracranial vascular flow voids
are preserved.
IMPRESSION: 1. No acute intracranial abnormality.
2. Mild chronic small vessel ischemic disease.

## 2017-04-14 DIAGNOSIS — K5904 Chronic idiopathic constipation: Secondary | ICD-10-CM | POA: Diagnosis not present

## 2017-04-14 DIAGNOSIS — R490 Dysphonia: Secondary | ICD-10-CM | POA: Diagnosis not present

## 2017-04-14 DIAGNOSIS — K458 Other specified abdominal hernia without obstruction or gangrene: Secondary | ICD-10-CM | POA: Insufficient documentation

## 2017-04-14 DIAGNOSIS — K5900 Constipation, unspecified: Secondary | ICD-10-CM | POA: Insufficient documentation

## 2017-04-14 DIAGNOSIS — K219 Gastro-esophageal reflux disease without esophagitis: Secondary | ICD-10-CM | POA: Diagnosis not present

## 2017-04-16 DIAGNOSIS — M419 Scoliosis, unspecified: Secondary | ICD-10-CM | POA: Diagnosis not present

## 2017-04-16 DIAGNOSIS — M353 Polymyalgia rheumatica: Secondary | ICD-10-CM | POA: Diagnosis not present

## 2017-04-16 DIAGNOSIS — G4733 Obstructive sleep apnea (adult) (pediatric): Secondary | ICD-10-CM | POA: Diagnosis not present

## 2017-04-16 DIAGNOSIS — M810 Age-related osteoporosis without current pathological fracture: Secondary | ICD-10-CM | POA: Diagnosis not present

## 2017-04-16 DIAGNOSIS — I10 Essential (primary) hypertension: Secondary | ICD-10-CM | POA: Diagnosis not present

## 2017-04-16 DIAGNOSIS — R7309 Other abnormal glucose: Secondary | ICD-10-CM | POA: Diagnosis not present

## 2017-05-29 ENCOUNTER — Telehealth: Payer: Self-pay | Admitting: Cardiovascular Disease

## 2017-05-29 NOTE — Telephone Encounter (Signed)
Spoke with pt, she has had 1-2 episodes of palpitations that required her to take metoprolol. She does not want to see a PA. She was added to dr [facility] schedule per her request.

## 2017-05-29 NOTE — Telephone Encounter (Signed)
°  New Prob  States she intermittently has had irregular heart beat for the last several weeks. States she has had to take an extra Metoprolol to help with symptoms. Requesting to see Dr. [name] ASAP. Offered first availability with Dr. [name] which is in November. Offered PA/NP but she declined. Requesting to speak to a nurse.

## 2017-06-01 ENCOUNTER — Other Ambulatory Visit: Payer: Self-pay | Admitting: Cardiovascular Disease

## 2017-06-02 NOTE — Telephone Encounter (Signed)
REFILL 

## 2017-06-03 DIAGNOSIS — M419 Scoliosis, unspecified: Secondary | ICD-10-CM | POA: Diagnosis not present

## 2017-06-03 DIAGNOSIS — M47897 Other spondylosis, lumbosacral region: Secondary | ICD-10-CM | POA: Diagnosis not present

## 2017-06-03 DIAGNOSIS — M545 Low back pain: Secondary | ICD-10-CM | POA: Diagnosis not present

## 2017-06-19 ENCOUNTER — Encounter: Payer: Self-pay | Admitting: Cardiovascular Disease

## 2017-06-19 ENCOUNTER — Ambulatory Visit (INDEPENDENT_AMBULATORY_CARE_PROVIDER_SITE_OTHER): Payer: Medicare Other | Admitting: Cardiovascular Disease

## 2017-06-19 VITALS — BP 147/81 | HR 64 | Ht 62.0 in | Wt 111.0 lb

## 2017-06-19 DIAGNOSIS — R002 Palpitations: Secondary | ICD-10-CM

## 2017-06-19 DIAGNOSIS — G4733 Obstructive sleep apnea (adult) (pediatric): Secondary | ICD-10-CM | POA: Diagnosis not present

## 2017-06-19 DIAGNOSIS — I10 Essential (primary) hypertension: Secondary | ICD-10-CM | POA: Diagnosis not present

## 2017-06-19 DIAGNOSIS — M4123 Other idiopathic scoliosis, cervicothoracic region: Secondary | ICD-10-CM

## 2017-06-19 DIAGNOSIS — I272 Pulmonary hypertension, unspecified: Secondary | ICD-10-CM

## 2017-06-19 DIAGNOSIS — R0789 Other chest pain: Secondary | ICD-10-CM | POA: Diagnosis not present

## 2017-06-19 NOTE — Patient Instructions (Signed)
Medication Instructions:  Your physician recommends that you continue on your current medications as directed. Please refer to the Current Medication list given to you today.  Follow-Up: Your physician wants you to follow-up in: 1 YEAR with Dr. [name]. You will receive a reminder letter in the mail two months in advance. If you don't receive a letter, please call our office to schedule the follow-up appointment.   Any Other Special Instructions Will Be Listed Below (If Applicable).     If you need a refill on your cardiac medications before your next appointment, please call your pharmacy.

## 2017-06-19 NOTE — Progress Notes (Signed)
Patient ID: RETAJ HILBUN, female   DOB: 14-Nov-1931, 83 y.o.   MRN: [id number]     Primary  M.D.: Dr. Lona Kettle  HPI: Janet Nguyen is a 83 y.o. female who presents for a 4 month followup cardiology evaluation.   Janet Nguyen has a history of documented SVT and also has PACs and PVCs  treated with beta blocker therapy. In April 2014 I further titrated her beta blocker therapy after cardiac event monitor revealed several bursts of recurrent SVT up to 177 beats per minute in March.  In July after she had had 2 episodes of chest fluttering which each lasted over 30 minutes which he did take metoprolol tartrate with relief I recommended further titration of her Toprol to 75 mg in the morning and 50 mg at night and if necessary she could further titrate this to 75 twice a day.  She has a history of obstructive sleep apnea but despite multiple attempts at CPAP utilization she has not been able to tolerate this. She was referred to Dr. [name] and has a customized dental appliance with mandibular advancement with improvement in some of her symptomatology. Due concern that her teeth may be moving in more recently she has has not been using customized appliance daily but has been using her old non-customized mouthguard.  At times shen has awakened abruptly from a dream with her heart pounding and a sensation of hot flashes, gasping for breath.   She also states that her scoliosis is getting worse. She does have left-sided musculoskeletal type chest pain due to her spine angulation.  Beause of her significant scoliosis, she feels she must sleep on her back.   She has a history of GERD for which she has been taking Nexium.  She presents for evaluation.  I had scheduled her for an overnight oximetry to see if she is a candidate for supplemental oxygen at nighttime since she refused to use CPAP and only very rarely uses her customized mouthpiece.  She does wear a mouthguard to reduce bruxism.  Oximetry  study was performed overnight on February 23/24.  Her mean oxygen saturation was 93.12%.  She spent 12 minutes and 16 seconds with O2 sat duration below 88% with the lowest O2 sat duration of 80%. I tried to set her up for supplemental oxygen at bedtime.  However, she has been denied for this on multiple occasions by her insurance/Medicare.  She feels that she is sleeping better.  She can only sleep in her back due to scoliosis.  Her left side is concave; her right side is convex.  She has had issues with labile blood pressure. She has had issues with lower back discomfort and sciatica leading to emergency room evaluation in November 2016.  She saw Dr. [name] for neurosurgical evaluation. She has migraine headaches. She  Recently saw Dr. [name] for these migraine headaches.  She has been on Toprol-XL 75 mg twice a day for her palpitations and presently denies any awareness an extra heartbeats. She takes Xanax on an as-needed basis for anxiety.  She has been taking Nexium 20, no grams every other day for GERD. She states her scoliosis is getting worse.  Has been experiencing more fatigue.  She also notes some occasional left hand numbness.  In a mouth guard but no ureteral longer uses her mandibular advancement device and not tolerate CPAP therapy for her obstructive sleep apnea.     She had experienced left-sided chest discomfort which most likely was  related to her significant scoliosis the potential neuropathy causing intermittent left arm and hand numbness.  I slightly reduced her Toprol-XL which she had been taking 150 mg daily and a 75 twice a day regimen to 75 and milligrams in the morning and 50 mg with ultimate plan to decrease this to 50 twice a day.  She states when she reduce this, she began to notice more optical migraines and resume taking the higher dose Toprol at 75 twice a day with improvement.  She has seen Dr. [name] for her paresthesias.  She admits to significant anxiety.  She had requested  zolpidem for sleep initiation and maintenance.  In the past, we had tried 6.25 slow-release version to help with sleep maintenance, but due to cost issues preferred the 5 mg sleep initiation dose.  She experiences optical migraine headaches.  She was being seen by neurologist, Dr. Melton Alar  She continues to have issues with her scoliosis causing discomfort in her neck and arm due to her distortion.  He tells me she underwent endoscopy and colonoscopy as well as banding of hemorrhoids.  She had a benign polyp removed.  She has issues with spastic colon.  Her sleep is better with zolpiden 5 mg.  She saw Kerin Ransom on 11/13/2016.  She had noticed that her hair was "thinning" and was concerned about this being the result of Toprol.  He suggested possibly decreasing Toprol but she preferred not until she had seen me.  Since that time, she continues to experience some optical migraines.  She has noticed some leg left neck discomfort.  She denies any patches of hair loss.  She denies differential arm weakness.  She continues to have difficulty with her scoliosis with hip pain and rib cage discomfort.   When I last saw her in March 2018, there was a blood pressure differential of 118/78 in the left arm and 140/80 in the right arm.  She subsequently underwent upper extremity Doppler evaluation on 12/25/2016.  She was noted have mild heterogeneous plaque with stable 1-39% bilateral internal carotid stenoses.  She had normal subclavian arteries bilaterally.  There are patent vertebral arteries with antegrade flow.  She did not have any significant blood pressure differential and had triphasic waveforms in both her right and biphasic in her left brachial artery.  Left blood pressure was 8 mm higher than the right brachial pressure.  At times, she continues to experience some vague chest wall symptoms, which most likely related to her posture.  She denies significant shortness of breath.  She has to sleep on her back  because of her scoliosis.  Uses a chin strap to prevent oral breathing.  She is not on CPAP.   Since I last saw her, she denies any episodes of exertional chest pain.  She continues to have difficulties sleeping because of her scoliosis and has to sleep on her back.  She also has a history of celiac disease.  She denies palpitations.  She denies PND, orthopnea.  She typically has difficulty with sleep maintenance and wakes up approximately 4 AM.  She presents for reevaluation.  Past Medical History:  Diagnosis Date  . Arrhythmia    History of SVT with documented PVC'S and  PAC'S  12/08/12 Nuc stress test normal LV EF 74%  Event Monitor  12/01/12-01/03/13  . Celiac disease    treated by Dr. [name]  . GERD (gastroesophageal reflux disease)   . Intervertebral disc stenosis of neural canal of cervical region   .  Irregular heart beat 11/30/12   ECHO-EF 60-65%  . Osteoporosis   . Scoliosis   . Scoliosis   . Sleep apnea 10/02/11 [facility]   Sleep study AHI -total sleep 10.3/hr  64.0/ hr during REM sleep.RDI 22.8/hr during total sleep 64.0/hr during REM sleep The lowest O2 sat during Non-REM and REM sleep was 86% and 88% respectively. 04/08/12 CPAP/BIPAP titration study [facility]    Past Surgical History:  Procedure Laterality Date  . APPENDECTOMY     ruptured at age 74 and had surgery  . CARDIAC CATHETERIZATION  01/27/06    Allergies  Allergen Reactions  . Gluten Meal     Unknown  . Codeine Nausea Only    Current Outpatient Prescriptions  Medication Sig Dispense Refill  . acetaminophen (TYLENOL) 500 MG tablet Take 500 mg by mouth as needed for pain.    Marland Kitchen ALPRAZolam (XANAX) 0.25 MG tablet Take 0.25 mg by mouth as needed for anxiety.     Marland Kitchen amLODipine (NORVASC) 2.5 MG tablet TAKE ONE TABLET BY MOUTH DAILY 90 tablet 2  . clidinium-chlordiazePOXIDE (LIBRAX) 5-2.5 MG capsule Take 1 capsule by mouth.    . FIBER COMPLETE PO Take 1 tablet by mouth daily.     . metoprolol succinate (TOPROL-XL) 50 MG 24 hr tablet Take 1 and 1/2 tablets ( total 75 mg) by mouth twice  daily 270 tablet 3  . Multiple Vitamin (MULTIVITAMIN WITH MINERALS) TABS Take 2 tablets by mouth daily.     Marland Kitchen Phenylephrine-Acetaminophen (EQL SINUS CONGESTION/PAIN DAY PO) Take by mouth as needed.    . zolpidem (AMBIEN) 5 MG tablet Take 1 tablet (5 mg total) by mouth at bedtime as needed. for sleep 30 tablet 1   No current facility-administered medications for this visit.     Socially she is divorced has 4 children 9 grandchildren. She does exercise. No tobacco use. She does occasional wine.  ROS General: Negative; No fevers, chills, or night sweats;  HEENT: Negative; No changes in vision or hearing, sinus congestion, difficulty swallowing Pulmonary: Negative; No cough, wheezing, shortness of breath, hemoptysis Cardiovascular: Positive for occasional chest wall pain and nocturnal palpitations GI: Negative; No nausea, vomiting, diarrhea, or abdominal pain GU: Negative; No dysuria, hematuria, or difficulty voiding Musculoskeletal: Positive for significant scoliosis; fibromyalgia;  joint pain, or weakness Hematologic/Oncology: Negative; no easy bruising, bleeding Endocrine: Negative; no heat/cold intolerance; no diabetes Neuro: Negative; no changes in balance, headaches Skin: Negative; No rashes or skin lesions Psychiatric: Negative; No behavioral problems, depression Sleep: Positive for sleep apnea ; No snoring, daytime sleepiness, hypersomnolence, bruxism, restless legs, hypnogognic hallucinations, no cataplexy Other comprehensive 14 point system review is negative.  PE BP (!) 147/81   Pulse 64   Ht 5' 2"  (1.575 m)   Wt 111 lb (50.3 kg)   BMI 20.30 kg/m    Repeat BP by me today was 138/76 in the right arm and 142/78 in the left arm.  Wt Readings from Last 3 Encounters:  06/19/17 111 lb (50.3 kg)  02/09/17 110 lb (49.9 kg)  12/03/16 109 lb (49.4 kg)   General: Alert,  oriented, no distress.  Skin: normal turgor, no rashes, warm and dry HEENT: Normocephalic, atraumatic. Pupils equal round and reactive to light; sclera anicteric; extraocular muscles intact;  Nose without nasal septal hypertrophy Mouth/Parynx benign; Mallinpatti scale 3 Neck: No JVD, no carotid bruits; normal carotid upstroke Lungs: clear to ausculatation and percussion; no wheezing or rales Chest wall: without tenderness  to palpitation; scoliosis Heart: PMI not displaced, RRR, s1 s2 normal, 1/6 systolic murmur, no diastolic murmur, no rubs, gallops, thrills, or heaves Abdomen: soft, nontender; no hepatosplenomehaly, BS+; abdominal aorta nontender and not dilated by palpation. Back: no CVA tenderness Pulses 2+ Musculoskeletal: full range of motion, normal strength, no joint deformities Extremities: no clubbing cyanosis or edema, Homan's sign negative  Neurologic: grossly nonfocal; Cranial nerves grossly wnl Psychologic: Normal mood and affect   ECG (independently read by me): Normal sinus rhythm at 64 bpm.  Isolated PAC.  QTc interval 414 ms.  QS V1 V2, unchanged.  May 2018 ECG (independently read by me): Normal sinus rhythm at 65 bpm.  QS V1, V2.  Normal intervals.  No ST segment changes.  March 2018 ECG (independently read by me): Normal sinus rhythm at 65 bpm.  No ectopy.  Normal intervals.  September 2017 ECG (independently read by me): Normal sinus rhythm at 62 bpm.  QS V1 and V2.  Normal intervals.  April 2017 ECG (independently read by me): Normal sinus rhythm at 70 bpm.  No ectopy.  PR interval 158 ms and QTc interval 414 ms.  March 2017 ECG (independently read by me): normal sinus rhythm at 61 bpm..  No ST segment changes.  Normal intervals.  QTc interval 396 ms.  August 2014 ECG (independently read by me): Normal sinus rhythm at 63 bpm.  QRS couplets V1 V2.  No cigarette ST-T changes.  May 2016 ECG (independently read by me): Sinus bradycardia 59 bpm.  No ectopy.  February  2016 ECG (independently read by me): Sinus bradycardia 56 bpm.  Normal intervals.  Prior ECG (independently read by me): Normal sinus rhythm at 62 beats per minute.  QTc interval 411 milliseconds.  QRS complex V1, V2.  Normal intervals.  ECG (independently read by me): Normal sinus rhythm at 62 beats per minute. Normal intervals. QTc interval 399 ms.  Prior ECG of 07/13/2013: Sinus rhythm at 61 beats per minute. QTc interval 406 ms. PR interval normal at 168 ms.  LABS: BMP Latest Ref Rng & Units 12/03/2016 08/19/2015 12/04/2014  Glucose 65 - 99 mg/dL 103(H) 113(H) 106(H)  BUN 7 - 25 mg/dL 10 8 12   Creatinine 0.60 - 0.88 mg/dL 0.85 0.86 0.86  Sodium 135 - 146 mmol/L 139 132(L) 138  Potassium 3.5 - 5.3 mmol/L 4.5 3.8 4.0  Chloride 98 - 110 mmol/L 102 98(L) 102  CO2 20 - 31 mmol/L 28 23 29   Calcium 8.6 - 10.4 mg/dL 9.8 9.8 9.9   Hepatic Function Latest Ref Rng & Units 12/03/2016 12/04/2014 05/11/2010  Total Protein 6.1 - 8.1 g/dL 7.1 7.4 7.2  Albumin 3.6 - 5.1 g/dL 4.4 4.5 3.3(L)  AST 10 - 35 U/L 30 37 18  ALT 6 - 29 U/L 21 24 18   Alk Phosphatase 33 - 130 U/L 55 53 66  Total Bilirubin 0.2 - 1.2 mg/dL 0.7 0.7 0.6   CBC Latest Ref Rng & Units 12/03/2016 08/19/2015 12/04/2014  WBC 3.8 - 10.8 K/uL 8.4 15.0(H) 9.9  Hemoglobin 11.7 - 15.5 g/dL 13.1 13.4 14.0  Hematocrit 35.0 - 45.0 % 39.9 39.5 41.1  Platelets 140 - 400 K/uL 313 231 323   Lab Results  Component Value Date   TSH 1.07 12/03/2016  Lipid Panel     Component Value Date/Time   CHOL 154 12/03/2016 0845   TRIG 81 12/03/2016 0845   HDL 67 12/03/2016 0845   CHOLHDL 2.3 12/03/2016 0845   VLDL 16 12/03/2016 0845  [place] 71 12/03/2016 0845     RADIOLOGY: No results found.  IMPRESSION:  1. Palpitation   2. Essential hypertension, benign   3. OSA (obstructive sleep apnea)   4. Atypical chest pain   5. Other idiopathic scoliosis, cervicothoracic region   6. Pulmonary hypertension, unspecified ([place])     ASSESSMENT AND  PLAN: Janet Nguyen is an 83 year old female who has a history of documented SVT and PACs/PVCs which have been controlled with beta blocker therapy. She has documented normal systolic function on her echo in March 2014 with grade 1 diastolic dysfunction and had  significant left atrial dilatation and mild dilatation of the right ventricle with moderate dilatation of the right atrium. She has mild/moderate pulmonary hypertension with PA pressures of 42 mm. Her palpitations have resolved on  Toprol-XL 75 mg twice a day.  She is sleeping significantly better with her new chin strap and old mouth guard.  She previously had a customized oral appliance by Dr. Oneal Grout, but due to discomfort stopped using this.  She never could tolerate any trial of CPAP therapy. She had experienced migraine headaches and  underwent neurologic evaluation. She has also experienced left-sided chest discomfort which may be related to her significant scoliosis and potential neuropathy causing intermittent left arm, hand numbness.  On her carotid studies in March 2018.  She had mild carotid plaque bilaterally without significant stenosis.  Her blood pressure today was  slightly increased on amlodipine 2.5 mg daily, and metoprolol, succinate 75 mg daily.  She is not having anginal symptoms.  I reviewed her laboratory from earlier this year.  Thyroid function studies were normal.  Glucose was minimally increased at 103.  She had normal LFTs and renal function.  LDL cholesterol was 71.  Total cholesterol 154 and HDL of 67.  I believe she is stable from a cardiovascular standpoint.  She continues to have issues with her scoliosis creating some chest wall discomfort and atypical chest pain.  Cardiovascularly, however, she is stable.  She will need to monitor her blood pressure and if this continues to stay above 140 titration of amlodipine to 5 mg daily.  Will be necessary.  She will follow-up with her primary care physician.  I will see her in one  year for cardiology reevaluation.  Troy Sine, MD, [facility]  06/21/2017 8:45 PM

## 2017-06-30 ENCOUNTER — Encounter (HOSPITAL_COMMUNITY): Payer: Self-pay | Admitting: Emergency Medicine

## 2017-06-30 ENCOUNTER — Emergency Department (HOSPITAL_COMMUNITY): Payer: Medicare Other

## 2017-06-30 ENCOUNTER — Inpatient Hospital Stay (HOSPITAL_COMMUNITY)
Admission: EM | Admit: 2017-06-30 | Discharge: 2017-07-02 | DRG: 392 | Disposition: A | Discharge status: Home / Self Care | Payer: Medicare Other | Attending: Internal Medicine | Admitting: Internal Medicine

## 2017-06-30 DIAGNOSIS — K529 Noninfective gastroenteritis and colitis, unspecified: Principal | ICD-10-CM | POA: Diagnosis present

## 2017-06-30 DIAGNOSIS — M419 Scoliosis, unspecified: Secondary | ICD-10-CM | POA: Diagnosis not present

## 2017-06-30 DIAGNOSIS — K219 Gastro-esophageal reflux disease without esophagitis: Secondary | ICD-10-CM | POA: Diagnosis present

## 2017-06-30 DIAGNOSIS — E872 Acidosis: Secondary | ICD-10-CM | POA: Diagnosis not present

## 2017-06-30 DIAGNOSIS — R9431 Abnormal electrocardiogram [ECG] [EKG]: Secondary | ICD-10-CM | POA: Diagnosis not present

## 2017-06-30 DIAGNOSIS — I471 Supraventricular tachycardia, unspecified: Secondary | ICD-10-CM | POA: Diagnosis present

## 2017-06-30 DIAGNOSIS — R200 Anesthesia of skin: Secondary | ICD-10-CM | POA: Diagnosis not present

## 2017-06-30 DIAGNOSIS — K5732 Diverticulitis of large intestine without perforation or abscess without bleeding: Secondary | ICD-10-CM | POA: Diagnosis not present

## 2017-06-30 DIAGNOSIS — E8729 Other acidosis: Secondary | ICD-10-CM

## 2017-06-30 DIAGNOSIS — M810 Age-related osteoporosis without current pathological fracture: Secondary | ICD-10-CM | POA: Diagnosis present

## 2017-06-30 DIAGNOSIS — R0602 Shortness of breath: Secondary | ICD-10-CM | POA: Diagnosis not present

## 2017-06-30 DIAGNOSIS — N281 Cyst of kidney, acquired: Secondary | ICD-10-CM | POA: Diagnosis not present

## 2017-06-30 DIAGNOSIS — K8020 Calculus of gallbladder without cholecystitis without obstruction: Secondary | ICD-10-CM | POA: Diagnosis present

## 2017-06-30 DIAGNOSIS — I119 Hypertensive heart disease without heart failure: Secondary | ICD-10-CM | POA: Diagnosis present

## 2017-06-30 DIAGNOSIS — K458 Other specified abdominal hernia without obstruction or gangrene: Secondary | ICD-10-CM | POA: Diagnosis present

## 2017-06-30 DIAGNOSIS — I6789 Other cerebrovascular disease: Secondary | ICD-10-CM | POA: Diagnosis not present

## 2017-06-30 DIAGNOSIS — R109 Unspecified abdominal pain: Secondary | ICD-10-CM | POA: Diagnosis not present

## 2017-06-30 DIAGNOSIS — J029 Acute pharyngitis, unspecified: Secondary | ICD-10-CM | POA: Diagnosis not present

## 2017-06-30 DIAGNOSIS — G4730 Sleep apnea, unspecified: Secondary | ICD-10-CM | POA: Diagnosis present

## 2017-06-30 DIAGNOSIS — K900 Celiac disease: Secondary | ICD-10-CM | POA: Diagnosis present

## 2017-06-30 DIAGNOSIS — Z79899 Other long term (current) drug therapy: Secondary | ICD-10-CM

## 2017-06-30 DIAGNOSIS — R110 Nausea: Secondary | ICD-10-CM | POA: Diagnosis not present

## 2017-06-30 LAB — COMPREHENSIVE METABOLIC PANEL
ALT: 17 U/L (ref 14–54)
AST: 40 U/L (ref 15–41)
Albumin: 5 g/dL (ref 3.5–5.0)
Alkaline Phosphatase: 46 U/L (ref 38–126)
Anion gap: 17 — ABNORMAL HIGH (ref 5–15)
BUN: 13 mg/dL (ref 6–20)
CO2: 18 mmol/L — ABNORMAL LOW (ref 22–32)
Calcium: 10.2 mg/dL (ref 8.9–10.3)
Chloride: 97 mmol/L — ABNORMAL LOW (ref 101–111)
Creatinine, Ser: 0.86 mg/dL (ref 0.44–1.00)
GFR calc Af Amer: >60 mL/min (ref >60)
GFR calc non Af Amer: >60 mL/min (ref >60)
Glucose, Bld: 121 mg/dL — ABNORMAL HIGH (ref 65–99)
Potassium: 3.6 mmol/L (ref 3.5–5.1)
Sodium: 132 mmol/L — ABNORMAL LOW (ref 135–145)
Total Bilirubin: 1.4 mg/dL — ABNORMAL HIGH (ref 0.3–1.2)
Total Protein: 8.2 g/dL — ABNORMAL HIGH (ref 6.5–8.1)

## 2017-06-30 LAB — CBC WITH DIFFERENTIAL/PLATELET
Basophils Absolute: 0 10*3/uL (ref 0.0–0.1)
Basophils Relative: 0 %
Eosinophils Absolute: 0 10*3/uL (ref 0.0–0.7)
Eosinophils Relative: 0 %
HCT: 40.7 % (ref 36.0–46.0)
Hemoglobin: 14.7 g/dL (ref 12.0–15.0)
Lymphocytes Relative: 29 %
Lymphs Abs: 3.5 10*3/uL (ref 0.7–4.0)
MCH: 32.3 pg (ref 26.0–34.0)
MCHC: 36.1 g/dL — ABNORMAL HIGH (ref 30.0–36.0)
MCV: 89.5 fL (ref 78.0–100.0)
Monocytes Absolute: 1 10*3/uL (ref 0.1–1.0)
Monocytes Relative: 8 %
Neutro Abs: 7.6 10*3/uL (ref 1.7–7.7)
Neutrophils Relative %: 63 %
Platelets: 278 10*3/uL (ref 150–400)
RBC: 4.55 MIL/uL (ref 3.87–5.11)
RDW: 11.8 % (ref 11.5–15.5)
WBC: 12.1 10*3/uL — ABNORMAL HIGH (ref 4.0–10.5)

## 2017-06-30 LAB — URINALYSIS, ROUTINE W REFLEX MICROSCOPIC
Bilirubin Urine: NEGATIVE
Glucose, UA: NEGATIVE mg/dL
Hgb urine dipstick: NEGATIVE
Ketones, ur: 20 mg/dL — AB
Leukocytes, UA: NEGATIVE
Nitrite: NEGATIVE
Protein, ur: NEGATIVE mg/dL
Specific Gravity, Urine: 1.008 (ref 1.005–1.030)
pH: 9 — ABNORMAL HIGH (ref 5.0–8.0)

## 2017-06-30 LAB — LIPASE, BLOOD: Lipase: 28 U/L (ref 11–51)

## 2017-06-30 LAB — I-STAT TROPONIN, ED: Troponin i, poc: 0.01 ng/mL (ref 0.00–0.08)

## 2017-06-30 MED ORDER — ONDANSETRON HCL 4 MG/2ML IJ SOLN
4.0000 mg | Freq: Once | INTRAMUSCULAR | Status: AC
  Administered 2017-06-30: 4 mg via INTRAVENOUS
  Filled 2017-06-30: qty 2

## 2017-06-30 MED ORDER — FENTANYL CITRATE (PF) 100 MCG/2ML IJ SOLN
25.0000 ug | Freq: Once | INTRAMUSCULAR | Status: AC
  Administered 2017-06-30: 25 ug via INTRAVENOUS
  Filled 2017-06-30: qty 2

## 2017-06-30 MED ORDER — LORAZEPAM 2 MG/ML IJ SOLN
0.5000 mg | Freq: Once | INTRAMUSCULAR | Status: AC
  Administered 2017-06-30: 0.5 mg via INTRAVENOUS
  Filled 2017-06-30: qty 1

## 2017-06-30 MED ORDER — FENTANYL CITRATE (PF) 100 MCG/2ML IJ SOLN
12.5000 ug | Freq: Once | INTRAMUSCULAR | Status: AC
  Administered 2017-06-30: 12.5 ug via INTRAVENOUS
  Filled 2017-06-30: qty 2

## 2017-06-30 NOTE — ED Provider Notes (Signed)
[facility] DEPT Provider Note   CSN: [id number] Arrival date & time: 06/30/17  1932     History   Chief Complaint Chief Complaint  Patient presents with  . Spasms    unilateral (L) carpal/pedal  . Anxiety    HPI Janet Nguyen is a 83 y.o. female.  Patient is an 83 year old female with a history of celiac disease, GERD, dysrhythmia and severe scoliosis who presents today with severe pain and retching. She states she woke up and it was a normal day and then mid morning she started to develop severe nausea. This progressed until she started retching but states nothing came up. That happened several times at home and she decided to go to urgent care for evaluation. Somewhere in this timeframe she also developed much more severe left-sided pain and some shortness of breath with a dry cough. Patient states she often has a dry cough because of postnasal drip but the shortness of breath is new which she feels is related to the pain. She always has significant pain on her left side due to her severe scoliosis but has not been this bad in quite some time. She denies any diarrhea or abdominal pain. She denies any urinary symptoms. No fever today. She had lab work done at urgent care but after getting home the nausea became worse and she had another severe episode of retching and did not think she could stay home any longer so she called 911 and was brought to the hospital. At home when all this was going on she had some bilateral tingling in her upper extremities however that has resolved. She has not taken any medications for pain or nausea at this time. She is worried about taking pain medication because she has had severe side effects in the past. She denies any prior history of PE and has not noticed any new leg swelling.   The history is provided by the patient.    Past Medical History:  Diagnosis Date  . Arrhythmia    History of SVT with documented PVC'S and  PAC'S  12/08/12 Nuc  stress test normal LV EF 74%  Event Monitor  12/01/12-01/03/13  . Celiac disease    treated by Dr. [name]  . GERD (gastroesophageal reflux disease)   . Intervertebral disc stenosis of neural canal of cervical region   . Irregular heart beat 11/30/12   ECHO-EF 60-65%  . Osteoporosis   . Scoliosis   . Scoliosis   . Sleep apnea 10/02/11 [facility]   Sleep study AHI -total sleep 10.3/hr  64.0/ hr during REM sleep.RDI 22.8/hr during total sleep 64.0/hr during REM sleep The lowest O2 sat during Non-REM and REM sleep was 86% and 88% respectively. 04/08/12 CPAP/BIPAP titration study  [facility]    Patient Active Problem List   Diagnosis Date Noted  . Fatigue 12/23/2015  . Sciatica of right side 10/06/2015  . History of migraine headaches 10/06/2015  . Frequent PVCs 12/28/2013  . Premature atrial contractions 12/28/2013  . PSVT (paroxysmal supraventricular tachycardia) ([place]) 12/28/2013  . Heart palpitations 07/13/2013  . Sleep apnea 04/11/2013  . Scoliosis 04/11/2013  . Atrial flutter with rapid ventricular response ([place]) 11/29/2012  . Chest pain, atypical 11/29/2012  . Fibromyalgia syndrome 11/29/2012  . Chronic steroid use 11/29/2012    Past Surgical History:  Procedure Laterality Date  . APPENDECTOMY     ruptured at age 7 and had surgery  . CARDIAC CATHETERIZATION  01/27/06  OB History    No data available       Home Medications    Prior to Admission medications   Medication Sig Start Date End Date Taking? Authorizing Provider  acetaminophen (TYLENOL) 500 MG tablet Take 500 mg by mouth as needed for pain.   Yes [provider]  amLODipine (NORVASC) 2.5 MG tablet TAKE ONE TABLET BY MOUTH DAILY 06/02/17  Yes Troy Sine, MD  FIBER COMPLETE PO Take 1 tablet by mouth daily.   Yes [provider]  metoprolol succinate (TOPROL-XL) 50 MG 24 hr tablet Take 1 and 1/2 tablets ( total 75 mg) by mouth twice  daily 12/03/16  Yes  Troy Sine, MD  Multiple Vitamin (MULTIVITAMIN WITH MINERALS) TABS Take 2 tablets by mouth daily.    Yes [provider]  pantoprazole (PROTONIX) 40 MG tablet Take 40 mg by mouth daily. 05/22/17  Yes [provider]  Phenylephrine-Acetaminophen (EQL SINUS CONGESTION/PAIN DAY PO) Take by mouth as needed.   Yes [provider]  zolpidem (AMBIEN) 5 MG tablet Take 1 tablet (5 mg total) by mouth at bedtime as needed. for sleep 12/03/16  Yes Troy Sine, MD    Family History Family History  Problem Relation Age of Onset  . Breast cancer Mother   . Heart disease Father     Social History Social History  Substance Use Topics  . Smoking status: Never Smoker  . Smokeless tobacco: Never Used  . Alcohol use 0.6 oz/week    1 Standard drinks or equivalent per week     Comment: drink ETOH socially     Allergies   Gluten meal and Codeine   Review of Systems Review of Systems  All other systems reviewed and are negative.    Physical Exam Updated Vital Signs BP (!) 149/87 (BP Location: Right Arm)   Pulse 63   Temp (!) 97.5 F (36.4 C) (Oral)   Resp 16   SpO2 99%   Physical Exam  Constitutional: She is oriented to person, place, and time. She appears well-developed and well-nourished. She appears distressed.  Appears very uncomfortable. Unable to sit in the bed.  Holding her left side and slightly splinting while breathing  HENT:  Head: Normocephalic and atraumatic.  Mouth/Throat: Oropharynx is clear and moist.  Eyes: Pupils are equal, round, and reactive to light. Conjunctivae and EOM are normal.  Neck: Normal range of motion. Neck supple.  Cardiovascular: Normal rate, regular rhythm and intact distal pulses.   No murmur heard. Pulmonary/Chest: Effort normal and breath sounds normal. No respiratory distress. She has no wheezes. She has no rales. She exhibits tenderness.  Tenderness along the left ribs  Abdominal: Soft. She exhibits no distension.  There is no tenderness. There is CVA tenderness. There is no rebound and no guarding.  Left flank tenderness  Musculoskeletal: Normal range of motion. She exhibits no edema or tenderness.  Neurological: She is alert and oriented to person, place, and time.  Skin: Skin is warm and dry. No rash noted. No erythema.  Psychiatric: She has a normal mood and affect. Her behavior is normal.  Nursing note and vitals reviewed.    ED Treatments / Results  Labs (all labs ordered are listed, but only abnormal results are displayed) Labs Reviewed  CBC WITH DIFFERENTIAL/PLATELET - Abnormal; Notable for the following:       Result Value   WBC 12.1 (*)    MCHC 36.1 (*)    All other components within  normal limits  COMPREHENSIVE METABOLIC PANEL - Abnormal; Notable for the following:    Sodium 132 (*)    Chloride 97 (*)    CO2 18 (*)    Glucose, Bld 121 (*)    Total Protein 8.2 (*)    Total Bilirubin 1.4 (*)    Anion gap 17 (*)    All other components within normal limits  URINALYSIS, ROUTINE W REFLEX MICROSCOPIC - Abnormal; Notable for the following:    Color, Urine STRAW (*)    pH 9.0 (*)    Ketones, ur 20 (*)    All other components within normal limits  LIPASE, BLOOD  I-STAT TROPONIN, ED    EKG  EKG Interpretation  Date/Time:  Tuesday June 30 2017 21:54:16 EDT Ventricular Rate:  61 PR Interval:    QRS Duration: 103 QT Interval:  452 QTC Calculation: 456 R Axis:   -35 Text Interpretation:  Sinus rhythm Left ventricular hypertrophy Anterior Q waves, possibly due to LVH No significant change since last tracing Confirmed by Blanchie Dessert [phone]) on 06/30/2017 10:20:05 PM       Radiology Dg Chest 2 View  Result Date: 06/30/2017 CLINICAL DATA:  Shortness of breath and nausea EXAM: CHEST  2 VIEW COMPARISON:  Chest radiograph 11/29/2012 FINDINGS: The heart size and mediastinal contours are within normal limits. Both lungs are clear. There is thoracolumbar dextroscoliosis.  IMPRESSION: No active cardiopulmonary disease. Electronically Signed   By: Ulyses Jarred M.D.   On: 06/30/2017 21:03    Procedures Procedures (including critical care time)  Medications Ordered in ED Medications  LORazepam (ATIVAN) injection 0.5 mg (not administered)  fentaNYL (SUBLIMAZE) injection 25 mcg (not administered)  fentaNYL (SUBLIMAZE) injection 12.5 mcg (12.5 mcg Intravenous Given 06/30/17 2131)  ondansetron (ZOFRAN) injection 4 mg (4 mg Intravenous Given 06/30/17 2120)     Initial Impression / Assessment and Plan / ED Course  I have reviewed the triage vital signs and the nursing notes.  Pertinent labs & imaging results that were available during my care of the patient were reviewed by me and considered in my medical decision making (see chart for details).     69 female presenting today with complaints of severe left-sided side pain, shortness of breath and retching. Patient is in significant discomfort. Vital signs are within normal limits however patient does take beta blockers regularly. She appears short of breath however it's unclear if that's related to pain. She has no localized abdominal pain but continues to have severe nausea. Patient's troponin and EKG are within normal limits a low suspicion that this is cardiac in nature. However concern for potential kidney stone versus PE versus rib fractures versus pneumothorax versus other abdominal pathology. Patient's labs show mild cytosis of 12,000 but normal hemoglobin. CMP with evidence of new anion gap of 17 and mild elevation of bilirubin. Patient's lipase is within normal limits. Chest x-ray without acute findings. After a small dose of fentanyl and Zofran patient continues to have nausea and pain. She was given more medications that she seemed to tolerate these. UA is pending  11:34 PM UA without blood and only small ketones.  CT of chest/abd/pelvis pending.  Final Clinical Impressions(s) / ED Diagnoses   Final  diagnoses:  None    New Prescriptions New Prescriptions   No medications on file     Blanchie Dessert, MD 06/30/17 2334

## 2017-06-30 NOTE — ED Triage Notes (Addendum)
Pt BIB [facility]. She is complaining of unilateral (L) side spasms in her L hand and L foot and L flank pain starting earlier today. She had one episode of "violent nausea" today, but denies vomiting. She was seen at urgent care and discharged, but her labs have not yet resulted from them. Hx of a fib controlled with metoprolol. Pt appears anxious during triage. No known stroke or neuro history reported.

## 2017-07-01 ENCOUNTER — Observation Stay (HOSPITAL_BASED_OUTPATIENT_CLINIC_OR_DEPARTMENT_OTHER): Payer: Medicare Other

## 2017-07-01 ENCOUNTER — Observation Stay (HOSPITAL_COMMUNITY): Payer: Medicare Other

## 2017-07-01 ENCOUNTER — Emergency Department (HOSPITAL_COMMUNITY): Payer: Medicare Other

## 2017-07-01 ENCOUNTER — Encounter (HOSPITAL_COMMUNITY): Payer: Self-pay

## 2017-07-01 DIAGNOSIS — K8020 Calculus of gallbladder without cholecystitis without obstruction: Secondary | ICD-10-CM | POA: Diagnosis present

## 2017-07-01 DIAGNOSIS — R0602 Shortness of breath: Secondary | ICD-10-CM | POA: Diagnosis not present

## 2017-07-01 DIAGNOSIS — K5732 Diverticulitis of large intestine without perforation or abscess without bleeding: Secondary | ICD-10-CM

## 2017-07-01 DIAGNOSIS — I361 Nonrheumatic tricuspid (valve) insufficiency: Secondary | ICD-10-CM

## 2017-07-01 DIAGNOSIS — R109 Unspecified abdominal pain: Secondary | ICD-10-CM | POA: Diagnosis present

## 2017-07-01 DIAGNOSIS — I340 Nonrheumatic mitral (valve) insufficiency: Secondary | ICD-10-CM

## 2017-07-01 DIAGNOSIS — R1084 Generalized abdominal pain: Secondary | ICD-10-CM | POA: Diagnosis not present

## 2017-07-01 DIAGNOSIS — E8729 Other acidosis: Secondary | ICD-10-CM | POA: Insufficient documentation

## 2017-07-01 DIAGNOSIS — K458 Other specified abdominal hernia without obstruction or gangrene: Secondary | ICD-10-CM | POA: Diagnosis present

## 2017-07-01 DIAGNOSIS — I471 Supraventricular tachycardia: Secondary | ICD-10-CM | POA: Diagnosis present

## 2017-07-01 DIAGNOSIS — G4730 Sleep apnea, unspecified: Secondary | ICD-10-CM | POA: Diagnosis present

## 2017-07-01 DIAGNOSIS — E872 Acidosis: Secondary | ICD-10-CM

## 2017-07-01 DIAGNOSIS — M419 Scoliosis, unspecified: Secondary | ICD-10-CM | POA: Diagnosis present

## 2017-07-01 DIAGNOSIS — K900 Celiac disease: Secondary | ICD-10-CM | POA: Diagnosis present

## 2017-07-01 DIAGNOSIS — Z79899 Other long term (current) drug therapy: Secondary | ICD-10-CM | POA: Diagnosis not present

## 2017-07-01 DIAGNOSIS — K529 Noninfective gastroenteritis and colitis, unspecified: Secondary | ICD-10-CM | POA: Diagnosis present

## 2017-07-01 DIAGNOSIS — K219 Gastro-esophageal reflux disease without esophagitis: Secondary | ICD-10-CM | POA: Diagnosis present

## 2017-07-01 DIAGNOSIS — M810 Age-related osteoporosis without current pathological fracture: Secondary | ICD-10-CM | POA: Diagnosis present

## 2017-07-01 DIAGNOSIS — I119 Hypertensive heart disease without heart failure: Secondary | ICD-10-CM | POA: Diagnosis present

## 2017-07-01 DIAGNOSIS — N281 Cyst of kidney, acquired: Secondary | ICD-10-CM | POA: Diagnosis not present

## 2017-07-01 DIAGNOSIS — R1032 Left lower quadrant pain: Secondary | ICD-10-CM | POA: Diagnosis not present

## 2017-07-01 LAB — HEPATIC FUNCTION PANEL
ALT: 16 U/L (ref 14–54)
AST: 28 U/L (ref 15–41)
Albumin: 4.4 g/dL (ref 3.5–5.0)
Alkaline Phosphatase: 40 U/L (ref 38–126)
Bilirubin, Direct: 0.1 mg/dL (ref 0.1–0.5)
Indirect Bilirubin: 1.2 mg/dL — ABNORMAL HIGH (ref 0.3–0.9)
Total Bilirubin: 1.3 mg/dL — ABNORMAL HIGH (ref 0.3–1.2)
Total Protein: 7.2 g/dL (ref 6.5–8.1)

## 2017-07-01 LAB — BASIC METABOLIC PANEL
Anion gap: 9 (ref 5–15)
BUN: 13 mg/dL (ref 6–20)
CO2: 24 mmol/L (ref 22–32)
Calcium: 9.3 mg/dL (ref 8.9–10.3)
Chloride: 101 mmol/L (ref 101–111)
Creatinine, Ser: 0.82 mg/dL (ref 0.44–1.00)
GFR calc Af Amer: >60 mL/min (ref >60)
GFR calc non Af Amer: >60 mL/min (ref >60)
Glucose, Bld: 102 mg/dL — ABNORMAL HIGH (ref 65–99)
Potassium: 3.6 mmol/L (ref 3.5–5.1)
Sodium: 134 mmol/L — ABNORMAL LOW (ref 135–145)

## 2017-07-01 LAB — TROPONIN I
Troponin I: <0.03 ng/mL (ref <0.03)
Troponin I: <0.03 ng/mL (ref <0.03)
Troponin I: <0.03 ng/mL (ref <0.03)

## 2017-07-01 LAB — CBC
HCT: 40.8 % (ref 36.0–46.0)
Hemoglobin: 14.1 g/dL (ref 12.0–15.0)
MCH: 31.1 pg (ref 26.0–34.0)
MCHC: 34.6 g/dL (ref 30.0–36.0)
MCV: 90.1 fL (ref 78.0–100.0)
Platelets: 272 10*3/uL (ref 150–400)
RBC: 4.53 MIL/uL (ref 3.87–5.11)
RDW: 12.1 % (ref 11.5–15.5)
WBC: 8.3 10*3/uL (ref 4.0–10.5)

## 2017-07-01 LAB — STREP PNEUMONIAE URINARY ANTIGEN: Strep Pneumo Urinary Antigen: NEGATIVE

## 2017-07-01 LAB — ECHOCARDIOGRAM COMPLETE
Height: 62 in
Weight: 1734.4 oz

## 2017-07-01 MED ORDER — IOPAMIDOL (ISOVUE-370) INJECTION 76%
100.0000 mL | Freq: Once | INTRAVENOUS | Status: AC | PRN
  Administered 2017-07-01: 80 mL via INTRAVENOUS

## 2017-07-01 MED ORDER — PIPERACILLIN-TAZOBACTAM 3.375 G IVPB 30 MIN
3.3750 g | Freq: Once | INTRAVENOUS | Status: AC
  Administered 2017-07-01: 3.375 g via INTRAVENOUS
  Filled 2017-07-01: qty 50

## 2017-07-01 MED ORDER — AMLODIPINE BESYLATE 5 MG PO TABS: 2.5000 mg | ORAL_TABLET | Freq: Every day | ORAL | Status: DC

## 2017-07-01 MED ORDER — METOPROLOL SUCCINATE ER 50 MG PO TB24
75.0000 mg | ORAL_TABLET | Freq: Two times a day (BID) | ORAL | Status: DC
  Administered 2017-07-01 – 2017-07-02 (×3): 75 mg via ORAL
  Filled 2017-07-01 (×3): qty 1

## 2017-07-01 MED ORDER — SODIUM CHLORIDE 0.9 % IV BOLUS (SEPSIS)
1000.0000 mL | Freq: Once | INTRAVENOUS | Status: AC
  Administered 2017-07-01: 1000 mL via INTRAVENOUS

## 2017-07-01 MED ORDER — ONDANSETRON HCL 4 MG/2ML IJ SOLN: 4.0000 mg | Freq: Four times a day (QID) | INTRAMUSCULAR | Status: DC | PRN

## 2017-07-01 MED ORDER — PANTOPRAZOLE SODIUM 40 MG PO TBEC: 40.0000 mg | DELAYED_RELEASE_TABLET | Freq: Every day | ORAL | Status: DC

## 2017-07-01 MED ORDER — SODIUM CHLORIDE 0.9 % IV SOLN
INTRAVENOUS | Status: AC
  Administered 2017-07-01 (×2): via INTRAVENOUS

## 2017-07-01 MED ORDER — DEXTROSE 5 % IV SOLN
500.0000 mg | INTRAVENOUS | Status: DC
  Filled 2017-07-01: qty 500

## 2017-07-01 MED ORDER — ACETAMINOPHEN 650 MG RE SUPP: 650.0000 mg | Freq: Four times a day (QID) | RECTAL | Status: DC | PRN

## 2017-07-01 MED ORDER — PIPERACILLIN-TAZOBACTAM 3.375 G IVPB
3.3750 g | Freq: Three times a day (TID) | INTRAVENOUS | Status: DC
  Administered 2017-07-01 – 2017-07-02 (×3): 3.375 g via INTRAVENOUS
  Filled 2017-07-01 (×4): qty 50

## 2017-07-01 MED ORDER — ZOLPIDEM TARTRATE 5 MG PO TABS
5.0000 mg | ORAL_TABLET | Freq: Every evening | ORAL | Status: DC | PRN
  Administered 2017-07-01: 5 mg via ORAL
  Filled 2017-07-01: qty 1

## 2017-07-01 MED ORDER — IOPAMIDOL (ISOVUE-370) INJECTION 76%
INTRAVENOUS | Status: AC
  Administered 2017-07-01: 80 mL via INTRAVENOUS
  Filled 2017-07-01: qty 100

## 2017-07-01 MED ORDER — TRAMADOL HCL 50 MG PO TABS
50.0000 mg | ORAL_TABLET | Freq: Four times a day (QID) | ORAL | Status: DC | PRN
  Administered 2017-07-01: 50 mg via ORAL
  Filled 2017-07-01: qty 1

## 2017-07-01 MED ORDER — AZITHROMYCIN 250 MG PO TABS
500.0000 mg | ORAL_TABLET | Freq: Every day | ORAL | Status: DC
  Administered 2017-07-01 – 2017-07-02 (×2): 500 mg via ORAL
  Filled 2017-07-01 (×2): qty 2

## 2017-07-01 MED ORDER — ACETAMINOPHEN 325 MG PO TABS
650.0000 mg | ORAL_TABLET | Freq: Four times a day (QID) | ORAL | Status: DC | PRN
Start: 2017-07-01 — End: 2017-07-02
  Administered 2017-07-01 – 2017-07-02 (×2): 650 mg via ORAL
  Filled 2017-07-01 (×2): qty 2

## 2017-07-01 MED ORDER — AMLODIPINE BESYLATE 5 MG PO TABS
2.5000 mg | ORAL_TABLET | ORAL | Status: DC
  Administered 2017-07-01: 2.5 mg via ORAL
  Filled 2017-07-01: qty 1

## 2017-07-01 MED ORDER — METOPROLOL SUCCINATE ER 50 MG PO TB24: 75.0000 mg | ORAL_TABLET | Freq: Every day | ORAL | Status: DC

## 2017-07-01 MED ORDER — ENSURE ENLIVE PO LIQD
237.0000 mL | Freq: Three times a day (TID) | ORAL | Status: DC
  Administered 2017-07-01 – 2017-07-02 (×2): 237 mL via ORAL

## 2017-07-01 MED ORDER — ONDANSETRON HCL 4 MG PO TABS: 4.0000 mg | ORAL_TABLET | Freq: Four times a day (QID) | ORAL | Status: DC | PRN

## 2017-07-01 MED ORDER — PANTOPRAZOLE SODIUM 40 MG IV SOLR: 40.0000 mg | INTRAVENOUS | Status: DC

## 2017-07-01 NOTE — Progress Notes (Signed)
Pharmacy Antibiotic Note  Janet Nguyen is a 83 y.o. female admitted on 06/30/2017 with intra-abdominal infection.  Pharmacy has been consulted for zosyn dosing.  Plan: Zosyn 3.375g IV q8h (4 hour infusion).  F/u scr/cultures     Temp (24hrs), Avg:97.5 F (36.4 C), Min:97.5 F (36.4 C), Max:97.5 F (36.4 C)   Recent Labs Lab 06/30/17 2120  WBC 12.1*  CREATININE 0.86    Estimated Creatinine Clearance: 39.2 mL/min (by C-G formula based on SCr of 0.86 mg/dL).    Allergies  Allergen Reactions  . Gluten Meal     Unknown  . Codeine Nausea Only    Antimicrobials this admission: 10/3 zosyn >>    >>   Dose adjustments this admission:   Microbiology results:  BCx:   UCx:    Sputum:    MRSA PCR:   Thank you for allowing pharmacy to be a part of this patient's care.  Dorrene German 07/01/2017 4:43 AM

## 2017-07-01 NOTE — Progress Notes (Signed)
Initial Nutrition Assessment  DOCUMENTATION CODES:   Severe malnutrition in context of chronic illness  INTERVENTION:  Ensure Enlive po TID, each supplement provides 350 kcal and 20 grams of protein  NUTRITION DIAGNOSIS:   Malnutrition (severe) related to chronic illness (Celiac disease/poor appetite) as evidenced by severe depletion of muscle mass, severe depletion of body fat, energy intake < or equal to 75% for > or equal to 1 month.  GOAL:   Patient will meet greater than or equal to 90% of their needs  MONITOR:   PO intake, Supplement acceptance, Weight trends, I & O's  REASON FOR ASSESSMENT:   Consult Assessment of nutrition requirement/status  ASSESSMENT:   Pt with PMH of celiac disease, HTN, SVT, GERD, osteoporosis, scoliosis presents with abdominal pain and worsening nausea.    RN called RD stating a consult was in and that she was going to call the MD to get diet clarification. RN reports pt inquired about ordering eggs but was on a clear liquid diet. Pt's diet was advanced to gluten free at 1129. RD spoke with pt at bedside regarding gluten free diet options.  Pt reports recent weight loss. Pt was unsure of current UBW but reports being ~130 lbs for most of her life.   Pt reports a decreased intake related to poor appetite. Pt reports she just "grazes" through the day, reporting this has gone on for "some time". Reports it has gone on for > 1 month. Pt reports she has been on a gluten free diet for over 50 years. Per chart review and pt, pt only consumed an ice cream for lunch today.  Pt reports drinking nutritional supplements at baseline and amenable to continue while admitted.   Nutrition focused physical exam completed. Findings include severe muscle depletion, severe fat depletion and no edema.  Labs and medications reviewed  Diet Order:  Diet gluten free Room service appropriate? Yes; Fluid consistency: Thin  Skin:  Reviewed, no issues  Last BM:   06/30/17  Height:   Ht Readings from Last 1 Encounters:  07/01/17 5' 2"  (1.575 m)    Weight:   Wt Readings from Last 1 Encounters:  07/01/17 108 lb 6.4 oz (49.2 kg)    Ideal Body Weight:  50 kg  BMI:  Body mass index is 19.83 kg/m.  Estimated Nutritional Needs:   Kcal:  1450-1650  Protein:  70-80 grams  Fluid:  >/= 1.5 L/d  EDUCATION NEEDS:   Education needs addressed  [place] Ranger, MS, RDN, LDN 07/01/2017 1:48 PM

## 2017-07-01 NOTE — ED Provider Notes (Signed)
Case signed out to me pending CT of chest, abdomen, pelvis for evaluation of nausea and left-sided abdominal pain. CT shows evidence of probable diverticulitis. Laboratory evaluation shows mild leukocytosis, and high anion gap metabolic acidosis. She required multiple doses of fentanyl to get relief of pain. Ondansetron to give her relief of nausea. Is concerned about the combination of active infection and metabolic acidosis. She is given IV fluids. Case is discussed with Dr. Hal Hope of [facility] who agrees to come and evaluate the patient for possible admission.  Results for orders placed or performed during the hospital encounter of 06/30/17  CBC with Differential/Platelet  Result Value Ref Range   WBC 12.1 (H) 4.0 - 10.5 K/uL   RBC 4.55 3.87 - 5.11 MIL/uL   Hemoglobin 14.7 12.0 - 15.0 g/dL   HCT 40.7 36.0 - 46.0 %   MCV 89.5 78.0 - 100.0 fL   MCH 32.3 26.0 - 34.0 pg   MCHC 36.1 (H) 30.0 - 36.0 g/dL   RDW 11.8 11.5 - 15.5 %   Platelets 278 150 - 400 K/uL   Neutrophils Relative % 63 %   Neutro Abs 7.6 1.7 - 7.7 K/uL   Lymphocytes Relative 29 %   Lymphs Abs 3.5 0.7 - 4.0 K/uL   Monocytes Relative 8 %   Monocytes Absolute 1.0 0.1 - 1.0 K/uL   Eosinophils Relative 0 %   Eosinophils Absolute 0.0 0.0 - 0.7 K/uL   Basophils Relative 0 %   Basophils Absolute 0.0 0.0 - 0.1 K/uL  Comprehensive metabolic panel  Result Value Ref Range   Sodium 132 (L) 135 - 145 mmol/L   Potassium 3.6 3.5 - 5.1 mmol/L   Chloride 97 (L) 101 - 111 mmol/L   CO2 18 (L) 22 - 32 mmol/L   Glucose, Bld 121 (H) 65 - 99 mg/dL   BUN 13 6 - 20 mg/dL   Creatinine, Ser 0.86 0.44 - 1.00 mg/dL   Calcium 10.2 8.9 - 10.3 mg/dL   Total Protein 8.2 (H) 6.5 - 8.1 g/dL   Albumin 5.0 3.5 - 5.0 g/dL   AST 40 15 - 41 U/L   ALT 17 14 - 54 U/L   Alkaline Phosphatase 46 38 - 126 U/L   Total Bilirubin 1.4 (H) 0.3 - 1.2 mg/dL   GFR calc non Af Amer >60 >60 mL/min   GFR calc Af Amer >60 >60 mL/min   Anion gap 17 (H) 5 -  15  Lipase, blood  Result Value Ref Range   Lipase 28 11 - 51 U/L  Urinalysis, Routine w reflex microscopic  Result Value Ref Range   Color, Urine STRAW (A) YELLOW   APPearance CLEAR CLEAR   Specific Gravity, Urine 1.008 1.005 - 1.030   pH 9.0 (H) 5.0 - 8.0   Glucose, UA NEGATIVE NEGATIVE mg/dL   Hgb urine dipstick NEGATIVE NEGATIVE   Bilirubin Urine NEGATIVE NEGATIVE   Ketones, ur 20 (A) NEGATIVE mg/dL   Protein, ur NEGATIVE NEGATIVE mg/dL   Nitrite NEGATIVE NEGATIVE   Leukocytes, UA NEGATIVE NEGATIVE  I-stat troponin, ED  Result Value Ref Range   Troponin i, poc 0.01 0.00 - 0.08 ng/mL   Comment 3           Dg Chest 2 View  Result Date: 06/30/2017 CLINICAL DATA:  Shortness of breath and nausea EXAM: CHEST  2 VIEW COMPARISON:  Chest radiograph 11/29/2012 FINDINGS: The heart size and mediastinal contours are within normal limits. Both lungs are clear. There  is thoracolumbar dextroscoliosis. IMPRESSION: No active cardiopulmonary disease. Electronically Signed   By: Ulyses Jarred M.D.   On: 06/30/2017 21:03   Ct Angio Chest Pe W And/or Wo Contrast  Result Date: 07/01/2017 CLINICAL DATA:  Left-sided pain since this morning. Shortness breath and cough. EXAM: CT ANGIOGRAPHY CHEST CT ABDOMEN AND PELVIS WITH CONTRAST TECHNIQUE: Multidetector CT imaging of the chest was performed using the standard protocol during bolus administration of intravenous contrast. Multiplanar CT image reconstructions and MIPs were obtained to evaluate the vascular anatomy. Multidetector CT imaging of the abdomen and pelvis was performed using the standard protocol during bolus administration of intravenous contrast. CONTRAST:  80 mL Isovue 370 IV. COMPARISON:  CT abdomen/pelvis 02/12/2016 and CT chest 01/28/2012 FINDINGS: CTA CHEST FINDINGS Cardiovascular: Mild cardiomegaly. Minimal calcified plaque over the aortic arch. No evidence of pulmonary embolism. Remaining vascular structures are unremarkable.  Mediastinum/Nodes: No significant mediastinal or hilar adenopathy. Remaining mediastinal structures are within normal. Lungs/Pleura: Lungs are adequately inflated and demonstrate a patchy bilateral nodular process some areas worsened some areas improved compared to the previous exam. Minimal associated bronchiectasis over the anterior right middle lobe. No effusion. Airways are otherwise within normal. Musculoskeletal: Degenerative change of the spine. Curvature of the thoracic spine convex right. Review of the MIP images confirms the above findings. CT ABDOMEN and PELVIS FINDINGS Hepatobiliary: Liver and biliary tree within normal. Possible small amount of gallstones versus sludge. Pancreas: Within normal. Spleen: Within normal. Adrenals/Urinary Tract: Adrenal glands are within normal. Kidneys are normal in size without hydronephrosis or nephrolithiasis. There is a 1.3 cm cyst over the upper pole left kidney unchanged. Ureters and bladder are unremarkable. Stomach/Bowel: Stomach and small bowel are within normal. Appendix not visualized. Minimal diverticulosis of the colon. There is stranding of the pericolonic fat adjacent a segment of descending colon in the left abdomen just below the left ribs anterior to patient's lumbar hernia. This inflammatory change may be due to mild acute colitis or diverticulitis. No free peritoneal air. Vascular/Lymphatic: Very minimal calcified plaque over the abdominal aorta and iliac arteries. No adenopathy. Reproductive: Within normal. Other: There is again evidence patient's posterior left lumbar hernia just below the posterior ribs with a short segment of colon over the hernia defect. Musculoskeletal: Moderate degenerate changes spine with multilevel disc disease over the lumbar spine and moderate biphasic curvature of the thoracolumbar spine. Mild degenerate change of the hips. Review of the MIP images confirms the above findings. IMPRESSION: No evidence of pulmonary embolism.  Patchy nodular pattern of opacification bilaterally which is a chronic waxing and waning process likely representing atypical infection or inflammatory process. Consider followup CT 6-8 weeks. Evidence of patient's posterior left lumbar hernia just below the posterior ribs with herniation of a short segment of colon. There is inflammatory change adjacent the colon in the left mid abdomen involving the descending colon. This may be due to mild acute colitis or diverticulitis. No perforation or abscess. Possible small amount of gallbladder sludge versus stones. Aortic Atherosclerosis (ICD10-I70.0). 1.3 cm cyst over the upper pole left kidney unchanged. Significant degenerative changes of the spinal multilevel disc disease and moderate biphasic curvature of the thoracolumbar spine. Electronically Signed   By: Marin Olp M.D.   On: 07/01/2017 02:57   Ct Abdomen Pelvis W Contrast  Result Date: 07/01/2017 CLINICAL DATA:  Left-sided pain since this morning. Shortness breath and cough. EXAM: CT ANGIOGRAPHY CHEST CT ABDOMEN AND PELVIS WITH CONTRAST TECHNIQUE: Multidetector CT imaging of the chest was performed using the  standard protocol during bolus administration of intravenous contrast. Multiplanar CT image reconstructions and MIPs were obtained to evaluate the vascular anatomy. Multidetector CT imaging of the abdomen and pelvis was performed using the standard protocol during bolus administration of intravenous contrast. CONTRAST:  80 mL Isovue 370 IV. COMPARISON:  CT abdomen/pelvis 02/12/2016 and CT chest 01/28/2012 FINDINGS: CTA CHEST FINDINGS Cardiovascular: Mild cardiomegaly. Minimal calcified plaque over the aortic arch. No evidence of pulmonary embolism. Remaining vascular structures are unremarkable. Mediastinum/Nodes: No significant mediastinal or hilar adenopathy. Remaining mediastinal structures are within normal. Lungs/Pleura: Lungs are adequately inflated and demonstrate a patchy bilateral nodular  process some areas worsened some areas improved compared to the previous exam. Minimal associated bronchiectasis over the anterior right middle lobe. No effusion. Airways are otherwise within normal. Musculoskeletal: Degenerative change of the spine. Curvature of the thoracic spine convex right. Review of the MIP images confirms the above findings. CT ABDOMEN and PELVIS FINDINGS Hepatobiliary: Liver and biliary tree within normal. Possible small amount of gallstones versus sludge. Pancreas: Within normal. Spleen: Within normal. Adrenals/Urinary Tract: Adrenal glands are within normal. Kidneys are normal in size without hydronephrosis or nephrolithiasis. There is a 1.3 cm cyst over the upper pole left kidney unchanged. Ureters and bladder are unremarkable. Stomach/Bowel: Stomach and small bowel are within normal. Appendix not visualized. Minimal diverticulosis of the colon. There is stranding of the pericolonic fat adjacent a segment of descending colon in the left abdomen just below the left ribs anterior to patient's lumbar hernia. This inflammatory change may be due to mild acute colitis or diverticulitis. No free peritoneal air. Vascular/Lymphatic: Very minimal calcified plaque over the abdominal aorta and iliac arteries. No adenopathy. Reproductive: Within normal. Other: There is again evidence patient's posterior left lumbar hernia just below the posterior ribs with a short segment of colon over the hernia defect. Musculoskeletal: Moderate degenerate changes spine with multilevel disc disease over the lumbar spine and moderate biphasic curvature of the thoracolumbar spine. Mild degenerate change of the hips. Review of the MIP images confirms the above findings. IMPRESSION: No evidence of pulmonary embolism. Patchy nodular pattern of opacification bilaterally which is a chronic waxing and waning process likely representing atypical infection or inflammatory process. Consider followup CT 6-8 weeks. Evidence of  patient's posterior left lumbar hernia just below the posterior ribs with herniation of a short segment of colon. There is inflammatory change adjacent the colon in the left mid abdomen involving the descending colon. This may be due to mild acute colitis or diverticulitis. No perforation or abscess. Possible small amount of gallbladder sludge versus stones. Aortic Atherosclerosis (ICD10-I70.0). 1.3 cm cyst over the upper pole left kidney unchanged. Significant degenerative changes of the spinal multilevel disc disease and moderate biphasic curvature of the thoracolumbar spine. Electronically Signed   By: Marin Olp M.D.   On: 80/99/8338 25:05      Delora Fuel, MD 39/76/73 [phone]

## 2017-07-01 NOTE — ED Notes (Signed)
This Nurse attempted to call floor for report, receiving Nurse unavailable , to call back in few minutes.

## 2017-07-01 NOTE — Care Management Obs Status (Signed)
Ideal NOTIFICATION   Patient Details  Name: Janet Nguyen MRN: [id number] Date of Birth: 1931-10-06   Medicare Observation Status Notification Given:  Yes (Pt did not sign form. It was explained and given. )    Cleave Ternes, RN 07/01/2017, 1:31 PM

## 2017-07-01 NOTE — ED Notes (Signed)
Call report to [place] at 5:15 am

## 2017-07-01 NOTE — Consult Note (Signed)
[facility] Surgery Consult/Admission Note  Janet Nguyen [facility] 20-Dec-1931  [id number].    Requesting MD: Dr. Hal Hope Chief Complaint/Reason for Consult: lumbar hernia  HPI:  Pt is a 83 year old female with a history of SVT, celiac disease, GERD, osteoporosis, scoliosis who presented to the ED with complaints of abdominal pain, chest pain and dry heaves. Pt states yesterday she woke up feeling normal and mid day began having nausea and dry heaves. She went to urgent care who did blood work and sent her home. She later called EMS cause the pain worsened as did the dry heaving. She was having pain in her left abdomen and left chest. Associated chills, sweats. She states chronic left sided abdominal pain she attributes to her scoliosis and that her ribs are so close to her pelvic bone. She did not vomit yesterday. She had a normal BM yesterday morning, none since and she denies flatus. She is not currently having any abdominal pain, nausea or dry heaves. Family was at bedside and I spoke to her future daughter in law about her concerns about the pt's poor oral intake. Pt has been seen by a surgeon in the past regarding her lumbar hernia and the plan was nonoperative. Pt denies LOC, blood in her stools, constipation, diarrhea, urinary symptoms.    Ct scan showed lumbar hernia with herniation of a short segment of colon. Inflammatory changes adjacent the colon in the mid abdomen involving the descending colon. May be mild acute colitis or diverticulitis.  [place] abd showed cholelithiasis without complicating factors.  HIDA pending Labs: Tbili 1.3, afebrile  ROS:  Review of Systems  Constitutional: Positive for chills and diaphoresis. Negative for fever.  Respiratory: Negative for shortness of breath and wheezing.   Cardiovascular: Positive for chest pain. Negative for leg swelling.  Gastrointestinal: Positive for abdominal pain and nausea. Negative for blood in stool, constipation, diarrhea,  melena and vomiting.  Genitourinary: Negative for dysuria and hematuria.  Musculoskeletal: Positive for back pain (chronic from scoliosis).  Skin: Negative for rash.  Neurological: Negative for dizziness and loss of consciousness.  All other systems reviewed and are negative.    Family History  Problem Relation Age of Onset  . Breast cancer Mother   . Heart disease Father     Past Medical History:  Diagnosis Date  . Arrhythmia    History of SVT with documented PVC'S and  PAC'S  12/08/12 Nuc stress test normal LV EF 74%  Event Monitor  12/01/12-01/03/13  . Celiac disease    treated by Dr. [name]  . GERD (gastroesophageal reflux disease)   . Intervertebral disc stenosis of neural canal of cervical region   . Irregular heart beat 11/30/12   ECHO-EF 60-65%  . Osteoporosis   . Scoliosis   . Scoliosis   . Sleep apnea 10/02/11 [facility]   Sleep study AHI -total sleep 10.3/hr  64.0/ hr during REM sleep.RDI 22.8/hr during total sleep 64.0/hr during REM sleep The lowest O2 sat during Non-REM and REM sleep was 86% and 88% respectively. 04/08/12 CPAP/BIPAP titration study [facility]    Past Surgical History:  Procedure Laterality Date  . APPENDECTOMY     ruptured at age 65 and had surgery  . CARDIAC CATHETERIZATION  01/27/06    Social History:  reports that she has never smoked. She has never used smokeless tobacco. She reports that she drinks about 0.6 oz of alcohol per week . She reports that she does not  use drugs.  Allergies:  Allergies  Allergen Reactions  . Gluten Meal     Unknown  . Codeine Nausea Only    Medications Prior to Admission  Medication Sig Dispense Refill  . acetaminophen (TYLENOL) 500 MG tablet Take 500 mg by mouth as needed for pain.    Marland Kitchen amLODipine (NORVASC) 2.5 MG tablet TAKE ONE TABLET BY MOUTH DAILY 90 tablet 2  . FIBER COMPLETE PO Take 1 tablet by mouth daily.    . metoprolol succinate (TOPROL-XL) 50 MG 24 hr  tablet Take 1 and 1/2 tablets ( total 75 mg) by mouth twice  daily 270 tablet 3  . Multiple Vitamin (MULTIVITAMIN WITH MINERALS) TABS Take 2 tablets by mouth daily.     . pantoprazole (PROTONIX) 40 MG tablet Take 40 mg by mouth daily.    Marland Kitchen Phenylephrine-Acetaminophen (EQL SINUS CONGESTION/PAIN DAY PO) Take by mouth as needed.    . zolpidem (AMBIEN) 5 MG tablet Take 1 tablet (5 mg total) by mouth at bedtime as needed. for sleep 30 tablet 1    Blood pressure (!) 155/77, pulse 64, temperature 98 F (36.7 C), temperature source Oral, resp. rate 16, height 5' 2"  (1.575 m), weight 108 lb 6.4 oz (49.2 kg), SpO2 96 %.  Physical Exam  Constitutional: She is oriented to person, place, and time. Vital signs are normal. No distress.  Thin, well appearing, elderly white female  HENT:  Head: Normocephalic and atraumatic.  Nose: Nose normal.  Mouth/Throat: Oropharynx is clear and moist. No oropharyngeal exudate.  Eyes: Pupils are equal, round, and reactive to light. Conjunctivae are normal. Right eye exhibits no discharge. Left eye exhibits no discharge. No scleral icterus.  Neck: Normal range of motion. Neck supple. No tracheal deviation present. No thyromegaly present.  Cardiovascular: Normal rate, regular rhythm, normal heart sounds and intact distal pulses.  Exam reveals no gallop and no friction rub.   No murmur heard. Pulses:      Radial pulses are 2+ on the right side, and 2+ on the left side.       Dorsalis pedis pulses are 2+ on the right side, and 2+ on the left side.  Pulmonary/Chest: Effort normal and breath sounds normal. No respiratory distress. She has no decreased breath sounds. She has no wheezes. She has no rhonchi. She has no rales.  Abdominal: Soft. Bowel sounds are normal. She exhibits no distension and no mass. There is no tenderness. There is no rebound and no guarding.  Musculoskeletal: She exhibits tenderness (left ASIS and left inferior ribs) and deformity (severe scoliosis). She  exhibits no edema.  Neurological: She is alert and oriented to person, place, and time. She has normal motor skills. No cranial nerve deficit (grossly intact) or sensory deficit.  Skin: Skin is warm and dry. No rash noted. She is not diaphoretic.  Psychiatric: Mood and affect normal.  Nursing note and vitals reviewed.   Results for orders placed or performed during the hospital encounter of 06/30/17 (from the past 48 hour(s))  Urinalysis, Routine w reflex microscopic     Status: Abnormal   Collection Time: 06/30/17  8:40 PM  Result Value Ref Range   Color, Urine STRAW (A) YELLOW   APPearance CLEAR CLEAR   Specific Gravity, Urine 1.008 1.005 - 1.030   pH 9.0 (H) 5.0 - 8.0   Glucose, UA NEGATIVE NEGATIVE mg/dL   Hgb urine dipstick NEGATIVE NEGATIVE   Bilirubin Urine NEGATIVE NEGATIVE   Ketones, ur 20 (A) NEGATIVE mg/dL  Protein, ur NEGATIVE NEGATIVE mg/dL   Nitrite NEGATIVE NEGATIVE   Leukocytes, UA NEGATIVE NEGATIVE  CBC with Differential/Platelet     Status: Abnormal   Collection Time: 06/30/17  9:20 PM  Result Value Ref Range   WBC 12.1 (H) 4.0 - 10.5 K/uL   RBC 4.55 3.87 - 5.11 MIL/uL   Hemoglobin 14.7 12.0 - 15.0 g/dL   HCT 40.7 36.0 - 46.0 %   MCV 89.5 78.0 - 100.0 fL   MCH 32.3 26.0 - 34.0 pg   MCHC 36.1 (H) 30.0 - 36.0 g/dL   RDW 11.8 11.5 - 15.5 %   Platelets 278 150 - 400 K/uL   Neutrophils Relative % 63 %   Neutro Abs 7.6 1.7 - 7.7 K/uL   Lymphocytes Relative 29 %   Lymphs Abs 3.5 0.7 - 4.0 K/uL   Monocytes Relative 8 %   Monocytes Absolute 1.0 0.1 - 1.0 K/uL   Eosinophils Relative 0 %   Eosinophils Absolute 0.0 0.0 - 0.7 K/uL   Basophils Relative 0 %   Basophils Absolute 0.0 0.0 - 0.1 K/uL  Comprehensive metabolic panel     Status: Abnormal   Collection Time: 06/30/17  9:20 PM  Result Value Ref Range   Sodium 132 (L) 135 - 145 mmol/L   Potassium 3.6 3.5 - 5.1 mmol/L   Chloride 97 (L) 101 - 111 mmol/L   CO2 18 (L) 22 - 32 mmol/L   Glucose, Bld 121 (H) 65 -  99 mg/dL   BUN 13 6 - 20 mg/dL   Creatinine, Ser 0.86 0.44 - 1.00 mg/dL   Calcium 10.2 8.9 - 10.3 mg/dL   Total Protein 8.2 (H) 6.5 - 8.1 g/dL   Albumin 5.0 3.5 - 5.0 g/dL   AST 40 15 - 41 U/L   ALT 17 14 - 54 U/L   Alkaline Phosphatase 46 38 - 126 U/L   Total Bilirubin 1.4 (H) 0.3 - 1.2 mg/dL   GFR calc non Af Amer >60 >60 mL/min   GFR calc Af Amer >60 >60 mL/min    Comment: (NOTE) The eGFR has been calculated using the CKD EPI equation. This calculation has not been validated in all clinical situations. eGFR's persistently <60 mL/min signify possible Chronic Kidney Disease.    Anion gap 17 (H) 5 - 15  Lipase, blood     Status: None   Collection Time: 06/30/17  9:20 PM  Result Value Ref Range   Lipase 28 11 - 51 U/L  I-stat troponin, ED     Status: None   Collection Time: 06/30/17  9:36 PM  Result Value Ref Range   Troponin i, poc 0.01 0.00 - 0.08 ng/mL   Comment 3            Comment: Due to the release kinetics of cTnI, a negative result within the first hours of the onset of symptoms does not rule out myocardial infarction with certainty. If myocardial infarction is still suspected, repeat the test at appropriate intervals.   Basic metabolic panel     Status: Abnormal   Collection Time: 07/01/17  4:32 AM  Result Value Ref Range   Sodium 134 (L) 135 - 145 mmol/L   Potassium 3.6 3.5 - 5.1 mmol/L   Chloride 101 101 - 111 mmol/L   CO2 24 22 - 32 mmol/L   Glucose, Bld 102 (H) 65 - 99 mg/dL   BUN 13 6 - 20 mg/dL   Creatinine, Ser 0.82 0.44 - 1.00  mg/dL   Calcium 9.3 8.9 - 10.3 mg/dL   GFR calc non Af Amer >60 >60 mL/min   GFR calc Af Amer >60 >60 mL/min    Comment: (NOTE) The eGFR has been calculated using the CKD EPI equation. This calculation has not been validated in all clinical situations. eGFR's persistently <60 mL/min signify possible Chronic Kidney Disease.    Anion gap 9 5 - 15  Troponin I (q 6hr x 3)     Status: None   Collection Time: 07/01/17  4:32 AM   Result Value Ref Range   Troponin I <0.03 <0.03 ng/mL  CBC     Status: None   Collection Time: 07/01/17  7:18 AM  Result Value Ref Range   WBC 8.3 4.0 - 10.5 K/uL   RBC 4.53 3.87 - 5.11 MIL/uL   Hemoglobin 14.1 12.0 - 15.0 g/dL   HCT 40.8 36.0 - 46.0 %   MCV 90.1 78.0 - 100.0 fL   MCH 31.1 26.0 - 34.0 pg   MCHC 34.6 30.0 - 36.0 g/dL   RDW 12.1 11.5 - 15.5 %   Platelets 272 150 - 400 K/uL  Hepatic function panel     Status: Abnormal   Collection Time: 07/01/17  8:20 AM  Result Value Ref Range   Total Protein 7.2 6.5 - 8.1 g/dL   Albumin 4.4 3.5 - 5.0 g/dL   AST 28 15 - 41 U/L   ALT 16 14 - 54 U/L   Alkaline Phosphatase 40 38 - 126 U/L   Total Bilirubin 1.3 (H) 0.3 - 1.2 mg/dL   Bilirubin, Direct 0.1 0.1 - 0.5 mg/dL   Indirect Bilirubin 1.2 (H) 0.3 - 0.9 mg/dL   Dg Chest 2 View  Result Date: 06/30/2017 CLINICAL DATA:  Shortness of breath and nausea EXAM: CHEST  2 VIEW COMPARISON:  Chest radiograph 11/29/2012 FINDINGS: The heart size and mediastinal contours are within normal limits. Both lungs are clear. There is thoracolumbar dextroscoliosis. IMPRESSION: No active cardiopulmonary disease. Electronically Signed   By: Ulyses Jarred M.D.   On: 06/30/2017 21:03   Ct Angio Chest Pe W And/or Wo Contrast  Result Date: 07/01/2017 CLINICAL DATA:  Left-sided pain since this morning. Shortness breath and cough. EXAM: CT ANGIOGRAPHY CHEST CT ABDOMEN AND PELVIS WITH CONTRAST TECHNIQUE: Multidetector CT imaging of the chest was performed using the standard protocol during bolus administration of intravenous contrast. Multiplanar CT image reconstructions and MIPs were obtained to evaluate the vascular anatomy. Multidetector CT imaging of the abdomen and pelvis was performed using the standard protocol during bolus administration of intravenous contrast. CONTRAST:  80 mL Isovue 370 IV. COMPARISON:  CT abdomen/pelvis 02/12/2016 and CT chest 01/28/2012 FINDINGS: CTA CHEST FINDINGS Cardiovascular: Mild  cardiomegaly. Minimal calcified plaque over the aortic arch. No evidence of pulmonary embolism. Remaining vascular structures are unremarkable. Mediastinum/Nodes: No significant mediastinal or hilar adenopathy. Remaining mediastinal structures are within normal. Lungs/Pleura: Lungs are adequately inflated and demonstrate a patchy bilateral nodular process some areas worsened some areas improved compared to the previous exam. Minimal associated bronchiectasis over the anterior right middle lobe. No effusion. Airways are otherwise within normal. Musculoskeletal: Degenerative change of the spine. Curvature of the thoracic spine convex right. Review of the MIP images confirms the above findings. CT ABDOMEN and PELVIS FINDINGS Hepatobiliary: Liver and biliary tree within normal. Possible small amount of gallstones versus sludge. Pancreas: Within normal. Spleen: Within normal. Adrenals/Urinary Tract: Adrenal glands are within normal. Kidneys are normal in size without hydronephrosis  or nephrolithiasis. There is a 1.3 cm cyst over the upper pole left kidney unchanged. Ureters and bladder are unremarkable. Stomach/Bowel: Stomach and small bowel are within normal. Appendix not visualized. Minimal diverticulosis of the colon. There is stranding of the pericolonic fat adjacent a segment of descending colon in the left abdomen just below the left ribs anterior to patient's lumbar hernia. This inflammatory change may be due to mild acute colitis or diverticulitis. No free peritoneal air. Vascular/Lymphatic: Very minimal calcified plaque over the abdominal aorta and iliac arteries. No adenopathy. Reproductive: Within normal. Other: There is again evidence patient's posterior left lumbar hernia just below the posterior ribs with a short segment of colon over the hernia defect. Musculoskeletal: Moderate degenerate changes spine with multilevel disc disease over the lumbar spine and moderate biphasic curvature of the thoracolumbar  spine. Mild degenerate change of the hips. Review of the MIP images confirms the above findings. IMPRESSION: No evidence of pulmonary embolism. Patchy nodular pattern of opacification bilaterally which is a chronic waxing and waning process likely representing atypical infection or inflammatory process. Consider followup CT 6-8 weeks. Evidence of patient's posterior left lumbar hernia just below the posterior ribs with herniation of a short segment of colon. There is inflammatory change adjacent the colon in the left mid abdomen involving the descending colon. This may be due to mild acute colitis or diverticulitis. No perforation or abscess. Possible small amount of gallbladder sludge versus stones. Aortic Atherosclerosis (ICD10-I70.0). 1.3 cm cyst over the upper pole left kidney unchanged. Significant degenerative changes of the spinal multilevel disc disease and moderate biphasic curvature of the thoracolumbar spine. Electronically Signed   By: Marin Olp M.D.   On: 07/01/2017 02:57   US Abdomen Complete  Result Date: 07/01/2017 CLINICAL DATA:  Abdominal pain EXAM: ABDOMEN ULTRASOUND COMPLETE COMPARISON:  07/01/2017 FINDINGS: Gallbladder: Well distended with tiny gallstones. No obstructive changes are seen. No pericholecystic fluid is noted. Common bile duct: Diameter: 4.2 mm. Liver: No focal lesion identified. Within normal limits in parenchymal echogenicity. Portal vein is patent on color Doppler imaging with normal direction of blood flow towards the liver. IVC: No abnormality visualized. Pancreas: Visualized portion unremarkable. Spleen: Not visualized but within normal limits on recent CT examination. Right Kidney: Length: 9.7 cm. Echogenicity within normal limits. No mass or hydronephrosis visualized. Left Kidney: Length: 10.2 cm. Upper pole cyst is again noted measuring 1.8 cm. Abdominal aorta: No aneurysm visualized. Other findings: None. IMPRESSION: Cholelithiasis without complicating factors.  Left renal cysts stable from recent CT. Electronically Signed   By: Inez Catalina M.D.   On: 07/01/2017 08:57   Ct Abdomen Pelvis W Contrast  Result Date: 07/01/2017 CLINICAL DATA:  Left-sided pain since this morning. Shortness breath and cough. EXAM: CT ANGIOGRAPHY CHEST CT ABDOMEN AND PELVIS WITH CONTRAST TECHNIQUE: Multidetector CT imaging of the chest was performed using the standard protocol during bolus administration of intravenous contrast. Multiplanar CT image reconstructions and MIPs were obtained to evaluate the vascular anatomy. Multidetector CT imaging of the abdomen and pelvis was performed using the standard protocol during bolus administration of intravenous contrast. CONTRAST:  80 mL Isovue 370 IV. COMPARISON:  CT abdomen/pelvis 02/12/2016 and CT chest 01/28/2012 FINDINGS: CTA CHEST FINDINGS Cardiovascular: Mild cardiomegaly. Minimal calcified plaque over the aortic arch. No evidence of pulmonary embolism. Remaining vascular structures are unremarkable. Mediastinum/Nodes: No significant mediastinal or hilar adenopathy. Remaining mediastinal structures are within normal. Lungs/Pleura: Lungs are adequately inflated and demonstrate a patchy bilateral nodular process some areas worsened some  areas improved compared to the previous exam. Minimal associated bronchiectasis over the anterior right middle lobe. No effusion. Airways are otherwise within normal. Musculoskeletal: Degenerative change of the spine. Curvature of the thoracic spine convex right. Review of the MIP images confirms the above findings. CT ABDOMEN and PELVIS FINDINGS Hepatobiliary: Liver and biliary tree within normal. Possible small amount of gallstones versus sludge. Pancreas: Within normal. Spleen: Within normal. Adrenals/Urinary Tract: Adrenal glands are within normal. Kidneys are normal in size without hydronephrosis or nephrolithiasis. There is a 1.3 cm cyst over the upper pole left kidney unchanged. Ureters and bladder are  unremarkable. Stomach/Bowel: Stomach and small bowel are within normal. Appendix not visualized. Minimal diverticulosis of the colon. There is stranding of the pericolonic fat adjacent a segment of descending colon in the left abdomen just below the left ribs anterior to patient's lumbar hernia. This inflammatory change may be due to mild acute colitis or diverticulitis. No free peritoneal air. Vascular/Lymphatic: Very minimal calcified plaque over the abdominal aorta and iliac arteries. No adenopathy. Reproductive: Within normal. Other: There is again evidence patient's posterior left lumbar hernia just below the posterior ribs with a short segment of colon over the hernia defect. Musculoskeletal: Moderate degenerate changes spine with multilevel disc disease over the lumbar spine and moderate biphasic curvature of the thoracolumbar spine. Mild degenerate change of the hips. Review of the MIP images confirms the above findings. IMPRESSION: No evidence of pulmonary embolism. Patchy nodular pattern of opacification bilaterally which is a chronic waxing and waning process likely representing atypical infection or inflammatory process. Consider followup CT 6-8 weeks. Evidence of patient's posterior left lumbar hernia just below the posterior ribs with herniation of a short segment of colon. There is inflammatory change adjacent the colon in the left mid abdomen involving the descending colon. This may be due to mild acute colitis or diverticulitis. No perforation or abscess. Possible small amount of gallbladder sludge versus stones. Aortic Atherosclerosis (ICD10-I70.0). 1.3 cm cyst over the upper pole left kidney unchanged. Significant degenerative changes of the spinal multilevel disc disease and moderate biphasic curvature of the thoracolumbar spine. Electronically Signed   By: Marin Olp M.D.   On: 07/01/2017 02:57      Assessment/Plan  history of SVT celiac disease GERD Osteoporosis scoliosis    Abdominal pain - do not feel her pain is related to her lumbar hernia, she is nontoxic and nontender on exam - this could be colitis in the setting of a high stool burdern - advance diet slowly - dietician consult pending - would consider miralax and a probiotic to help with her stool burden  We will sign off at this time. If further needs arise please page [place].   Kalman Drape, [facility] Surgery 07/01/2017, 10:59 AM Pager: [phone] Consults: [phone] Mon-Fri 7:00 am-4:30 pm Sat-Sun 7:00 am-11:30 am

## 2017-07-01 NOTE — H&P (Signed)
History and Physical    Janet Nguyen IEP:[id number] DOB: Aug 08, 1932 DOA: 06/30/2017  PCP: Lawerance Cruel, MD  Patient coming from: Home.  Chief Complaint: Nausea and abdominal pain.  HPI: Janet Nguyen is a 83 y.o. female with history of hypertension, SVT, celiac disease presents to the ER with complaints of worsening nausea since yesterday morning. Also has been having some abdominal discomfort with some chest discomfort. Chest discomfort is diffuse across the chest and patient is not able to exactly describe the chest pain characterization. Patient has had breakfast following which patient had persistent nausea. Abdominal discomfort is mostly in the lower quadrants and left flank which patient states is been chronic.   ED Course: In the ER labs revealed metabolic acidosis. CT of the chest and abdomen was done. CT chest shows possible inflammatory versus infectious changes and CT abdomen shows lumbar hernia which is known for the patient with possible colitis. Also is showing gallbladder sludge. Patient was given fluid bolus and antibiotics and admitted for further management.  Review of Systems: As per HPI, rest all negative.   Past Medical History:  Diagnosis Date  . Arrhythmia    History of SVT with documented PVC'S and  PAC'S  12/08/12 Nuc stress test normal LV EF 74%  Event Monitor  12/01/12-01/03/13  . Celiac disease    treated by Dr. [name]  . GERD (gastroesophageal reflux disease)   . Intervertebral disc stenosis of neural canal of cervical region   . Irregular heart beat 11/30/12   ECHO-EF 60-65%  . Osteoporosis   . Scoliosis   . Scoliosis   . Sleep apnea 10/02/11 [facility]   Sleep study AHI -total sleep 10.3/hr  64.0/ hr during REM sleep.RDI 22.8/hr during total sleep 64.0/hr during REM sleep The lowest O2 sat during Non-REM and REM sleep was 86% and 88% respectively. 04/08/12 CPAP/BIPAP titration study [facility]    Past  Surgical History:  Procedure Laterality Date  . APPENDECTOMY     ruptured at age 9 and had surgery  . CARDIAC CATHETERIZATION  01/27/06     reports that she has never smoked. She has never used smokeless tobacco. She reports that she drinks about 0.6 oz of alcohol per week . She reports that she does not use drugs.  Allergies  Allergen Reactions  . Gluten Meal     Unknown  . Codeine Nausea Only    Family History  Problem Relation Age of Onset  . Breast cancer Mother   . Heart disease Father     Prior to Admission medications   Medication Sig Start Date End Date Taking? Authorizing Provider  acetaminophen (TYLENOL) 500 MG tablet Take 500 mg by mouth as needed for pain.   Yes [provider]  amLODipine (NORVASC) 2.5 MG tablet TAKE ONE TABLET BY MOUTH DAILY 06/02/17  Yes Troy Sine, MD  FIBER COMPLETE PO Take 1 tablet by mouth daily.   Yes [provider]  metoprolol succinate (TOPROL-XL) 50 MG 24 hr tablet Take 1 and 1/2 tablets ( total 75 mg) by mouth twice  daily 12/03/16  Yes Troy Sine, MD  Multiple Vitamin (MULTIVITAMIN WITH MINERALS) TABS Take 2 tablets by mouth daily.    Yes [provider]  pantoprazole (PROTONIX) 40 MG tablet Take 40 mg by mouth daily. 05/22/17  Yes [provider]  Phenylephrine-Acetaminophen (EQL SINUS CONGESTION/PAIN DAY PO) Take by mouth as needed.   Yes [provider]  zolpidem (AMBIEN) 5 MG tablet Take 1 tablet (5 mg total) by mouth at bedtime as needed. for sleep 12/03/16  Yes Troy Sine, MD    Physical Exam: Vitals:   06/30/17 2230 06/30/17 2320 07/01/17 0352 07/01/17 0400  BP:  (!) 155/78 123/71 111/64  Pulse: 63 62 62 (!) 59  Resp: 16 20 18 13   Temp:      TempSrc:      SpO2:  100% 96%       Constitutional: Moderately built and nourished. Vitals:   06/30/17 2230 06/30/17 2320 07/01/17 0352 07/01/17 0400  BP:  (!) 155/78 123/71 111/64  Pulse: 63 62 62 (!) 59  Resp: 16 20 18 13     Temp:      TempSrc:      SpO2:  100% 96%    Eyes: Anicteric no pallor. ENMT: No discharge from the ears eyes nose and mouth. Neck: No mass felt. No JVD appreciated. Respiratory: No rhonchi or crepitations. Cardiovascular: S1 and S2 heard no murmurs appreciated. Abdomen: Soft nontender bowel sounds present. Musculoskeletal: No edema. No joint effusion. Skin: No rash. Skin appears warm. Neurologic: Alert awake oriented to time place and person. Moves all extremities. Psychiatric: Appears normal. Normal affect.   Labs on Admission: I have personally reviewed following labs and imaging studies  CBC:  Recent Labs Lab 06/30/17 2120  WBC 12.1*  NEUTROABS 7.6  HGB 14.7  HCT 40.7  MCV 89.5  PLT 830   Basic Metabolic Panel:  Recent Labs Lab 06/30/17 2120  NA 132*  K 3.6  CL 97*  CO2 18*  GLUCOSE 121*  BUN 13  CREATININE 0.86  CALCIUM 10.2   GFR: Estimated Creatinine Clearance: 39.2 mL/min (by C-G formula based on SCr of 0.86 mg/dL). Liver Function Tests:  Recent Labs Lab 06/30/17 2120  AST 40  ALT 17  ALKPHOS 46  BILITOT 1.4*  PROT 8.2*  ALBUMIN 5.0    Recent Labs Lab 06/30/17 2120  LIPASE 28   No results for input(s): AMMONIA in the last 168 hours. Coagulation Profile: No results for input(s): INR, PROTIME in the last 168 hours. Cardiac Enzymes: No results for input(s): CKTOTAL, CKMB, CKMBINDEX, TROPONINI in the last 168 hours. BNP (last 3 results) No results for input(s): PROBNP in the last 8760 hours. HbA1C: No results for input(s): HGBA1C in the last 72 hours. CBG: No results for input(s): GLUCAP in the last 168 hours. Lipid Profile: No results for input(s): CHOL, HDL, LDLCALC, TRIG, CHOLHDL, LDLDIRECT in the last 72 hours. Thyroid Function Tests: No results for input(s): TSH, T4TOTAL, FREET4, T3FREE, THYROIDAB in the last 72 hours. Anemia Panel: No results for input(s): VITAMINB12, FOLATE, FERRITIN, TIBC, IRON, RETICCTPCT in the last 72  hours. Urine analysis:    Component Value Date/Time   COLORURINE STRAW (A) 06/30/2017 2040   APPEARANCEUR CLEAR 06/30/2017 2040   LABSPEC 1.008 06/30/2017 2040   PHURINE 9.0 (H) 06/30/2017 2040   GLUCOSEU NEGATIVE 06/30/2017 2040   HGBUR NEGATIVE 06/30/2017 2040   BILIRUBINUR NEGATIVE 06/30/2017 2040   KETONESUR 20 (A) 06/30/2017 2040   PROTEINUR NEGATIVE 06/30/2017 2040   NITRITE NEGATIVE 06/30/2017 2040   LEUKOCYTESUR NEGATIVE 06/30/2017 2040   Sepsis Labs: @LABRCNTIP (procalcitonin:4,lacticidven:4) )No results found for this or any previous visit (from the past 240 hour(s)).   Radiological Exams on Admission: Dg Chest 2 View  Result Date: 06/30/2017 CLINICAL DATA:  Shortness of breath and nausea EXAM: CHEST  2 VIEW COMPARISON:  Chest radiograph 11/29/2012  FINDINGS: The heart size and mediastinal contours are within normal limits. Both lungs are clear. There is thoracolumbar dextroscoliosis. IMPRESSION: No active cardiopulmonary disease. Electronically Signed   By: Ulyses Jarred M.D.   On: 06/30/2017 21:03   Ct Angio Chest Pe W And/or Wo Contrast  Result Date: 07/01/2017 CLINICAL DATA:  Left-sided pain since this morning. Shortness breath and cough. EXAM: CT ANGIOGRAPHY CHEST CT ABDOMEN AND PELVIS WITH CONTRAST TECHNIQUE: Multidetector CT imaging of the chest was performed using the standard protocol during bolus administration of intravenous contrast. Multiplanar CT image reconstructions and MIPs were obtained to evaluate the vascular anatomy. Multidetector CT imaging of the abdomen and pelvis was performed using the standard protocol during bolus administration of intravenous contrast. CONTRAST:  80 mL Isovue 370 IV. COMPARISON:  CT abdomen/pelvis 02/12/2016 and CT chest 01/28/2012 FINDINGS: CTA CHEST FINDINGS Cardiovascular: Mild cardiomegaly. Minimal calcified plaque over the aortic arch. No evidence of pulmonary embolism. Remaining vascular structures are unremarkable.  Mediastinum/Nodes: No significant mediastinal or hilar adenopathy. Remaining mediastinal structures are within normal. Lungs/Pleura: Lungs are adequately inflated and demonstrate a patchy bilateral nodular process some areas worsened some areas improved compared to the previous exam. Minimal associated bronchiectasis over the anterior right middle lobe. No effusion. Airways are otherwise within normal. Musculoskeletal: Degenerative change of the spine. Curvature of the thoracic spine convex right. Review of the MIP images confirms the above findings. CT ABDOMEN and PELVIS FINDINGS Hepatobiliary: Liver and biliary tree within normal. Possible small amount of gallstones versus sludge. Pancreas: Within normal. Spleen: Within normal. Adrenals/Urinary Tract: Adrenal glands are within normal. Kidneys are normal in size without hydronephrosis or nephrolithiasis. There is a 1.3 cm cyst over the upper pole left kidney unchanged. Ureters and bladder are unremarkable. Stomach/Bowel: Stomach and small bowel are within normal. Appendix not visualized. Minimal diverticulosis of the colon. There is stranding of the pericolonic fat adjacent a segment of descending colon in the left abdomen just below the left ribs anterior to patient's lumbar hernia. This inflammatory change may be due to mild acute colitis or diverticulitis. No free peritoneal air. Vascular/Lymphatic: Very minimal calcified plaque over the abdominal aorta and iliac arteries. No adenopathy. Reproductive: Within normal. Other: There is again evidence patient's posterior left lumbar hernia just below the posterior ribs with a short segment of colon over the hernia defect. Musculoskeletal: Moderate degenerate changes spine with multilevel disc disease over the lumbar spine and moderate biphasic curvature of the thoracolumbar spine. Mild degenerate change of the hips. Review of the MIP images confirms the above findings. IMPRESSION: No evidence of pulmonary embolism.  Patchy nodular pattern of opacification bilaterally which is a chronic waxing and waning process likely representing atypical infection or inflammatory process. Consider followup CT 6-8 weeks. Evidence of patient's posterior left lumbar hernia just below the posterior ribs with herniation of a short segment of colon. There is inflammatory change adjacent the colon in the left mid abdomen involving the descending colon. This may be due to mild acute colitis or diverticulitis. No perforation or abscess. Possible small amount of gallbladder sludge versus stones. Aortic Atherosclerosis (ICD10-I70.0). 1.3 cm cyst over the upper pole left kidney unchanged. Significant degenerative changes of the spinal multilevel disc disease and moderate biphasic curvature of the thoracolumbar spine. Electronically Signed   By: Marin Olp M.D.   On: 07/01/2017 02:57   Ct Abdomen Pelvis W Contrast  Result Date: 07/01/2017 CLINICAL DATA:  Left-sided pain since this morning. Shortness breath and cough. EXAM: CT ANGIOGRAPHY CHEST CT  ABDOMEN AND PELVIS WITH CONTRAST TECHNIQUE: Multidetector CT imaging of the chest was performed using the standard protocol during bolus administration of intravenous contrast. Multiplanar CT image reconstructions and MIPs were obtained to evaluate the vascular anatomy. Multidetector CT imaging of the abdomen and pelvis was performed using the standard protocol during bolus administration of intravenous contrast. CONTRAST:  80 mL Isovue 370 IV. COMPARISON:  CT abdomen/pelvis 02/12/2016 and CT chest 01/28/2012 FINDINGS: CTA CHEST FINDINGS Cardiovascular: Mild cardiomegaly. Minimal calcified plaque over the aortic arch. No evidence of pulmonary embolism. Remaining vascular structures are unremarkable. Mediastinum/Nodes: No significant mediastinal or hilar adenopathy. Remaining mediastinal structures are within normal. Lungs/Pleura: Lungs are adequately inflated and demonstrate a patchy bilateral nodular  process some areas worsened some areas improved compared to the previous exam. Minimal associated bronchiectasis over the anterior right middle lobe. No effusion. Airways are otherwise within normal. Musculoskeletal: Degenerative change of the spine. Curvature of the thoracic spine convex right. Review of the MIP images confirms the above findings. CT ABDOMEN and PELVIS FINDINGS Hepatobiliary: Liver and biliary tree within normal. Possible small amount of gallstones versus sludge. Pancreas: Within normal. Spleen: Within normal. Adrenals/Urinary Tract: Adrenal glands are within normal. Kidneys are normal in size without hydronephrosis or nephrolithiasis. There is a 1.3 cm cyst over the upper pole left kidney unchanged. Ureters and bladder are unremarkable. Stomach/Bowel: Stomach and small bowel are within normal. Appendix not visualized. Minimal diverticulosis of the colon. There is stranding of the pericolonic fat adjacent a segment of descending colon in the left abdomen just below the left ribs anterior to patient's lumbar hernia. This inflammatory change may be due to mild acute colitis or diverticulitis. No free peritoneal air. Vascular/Lymphatic: Very minimal calcified plaque over the abdominal aorta and iliac arteries. No adenopathy. Reproductive: Within normal. Other: There is again evidence patient's posterior left lumbar hernia just below the posterior ribs with a short segment of colon over the hernia defect. Musculoskeletal: Moderate degenerate changes spine with multilevel disc disease over the lumbar spine and moderate biphasic curvature of the thoracolumbar spine. Mild degenerate change of the hips. Review of the MIP images confirms the above findings. IMPRESSION: No evidence of pulmonary embolism. Patchy nodular pattern of opacification bilaterally which is a chronic waxing and waning process likely representing atypical infection or inflammatory process. Consider followup CT 6-8 weeks. Evidence of  patient's posterior left lumbar hernia just below the posterior ribs with herniation of a short segment of colon. There is inflammatory change adjacent the colon in the left mid abdomen involving the descending colon. This may be due to mild acute colitis or diverticulitis. No perforation or abscess. Possible small amount of gallbladder sludge versus stones. Aortic Atherosclerosis (ICD10-I70.0). 1.3 cm cyst over the upper pole left kidney unchanged. Significant degenerative changes of the spinal multilevel disc disease and moderate biphasic curvature of the thoracolumbar spine. Electronically Signed   By: Marin Olp M.D.   On: 07/01/2017 02:57    EKG: Independently reviewed. Normal sinus rhythm with Q waves in anterior leads.  Assessment/Plan Principal Problem:   Abdominal pain Active Problems:   Sleep apnea   PSVT (paroxysmal supraventricular tachycardia) (HCC)    1. Abdominal pain with nausea - could be from colitis. Patient also has a lumbar hernia and gallbladder sludge. I have ordered sonogram of the abdomen to check for any features of cholecystitis. Will request surgery to see patient for the lumbar hernia. Patient on clear liquids. 2. Possible pneumonia versus inflammatory change - will need repeat CT  chest in 6-7 weeks. Patient is on and take antibiotics. Check for urine for Legionella strep antigen sputum cultures.  3. Chest pain - presently chest pain-free. We'll cycle cardiac markers and check 2-D echo.  4. Hypertension and history of SVT and amlodipine and metoprolol which will be continued.  I have reviewed patient's old charts and labs.   DVT prophylaxis: SCDs.  Code Status: Full code.  Family Communication: Patient's family at the bedside.  Disposition Plan: Home.  Consults called: Will request surgical consult.  Admission status: Observation.    Rise Patience MD [facility] Pager [phone].  If 7PM-7AM, please contact  night-coverage [email or web address] Password [email or web address]  07/01/2017, 4:32 AM

## 2017-07-01 NOTE — Progress Notes (Signed)
  Echocardiogram 2D Echocardiogram has been performed.  Akesha Uresti L Androw 07/01/2017, 1:10 PM

## 2017-07-02 ENCOUNTER — Encounter (HOSPITAL_COMMUNITY): Payer: Self-pay

## 2017-07-02 DIAGNOSIS — R1032 Left lower quadrant pain: Secondary | ICD-10-CM

## 2017-07-02 LAB — CBC
HCT: 32.9 % — ABNORMAL LOW (ref 36.0–46.0)
Hemoglobin: 11.2 g/dL — ABNORMAL LOW (ref 12.0–15.0)
MCH: 31.8 pg (ref 26.0–34.0)
MCHC: 34 g/dL (ref 30.0–36.0)
MCV: 93.5 fL (ref 78.0–100.0)
Platelets: 224 10*3/uL (ref 150–400)
RBC: 3.52 MIL/uL — ABNORMAL LOW (ref 3.87–5.11)
RDW: 12.6 % (ref 11.5–15.5)
WBC: 10.1 10*3/uL (ref 4.0–10.5)

## 2017-07-02 LAB — BASIC METABOLIC PANEL
Anion gap: 6 (ref 5–15)
BUN: 9 mg/dL (ref 6–20)
CO2: 24 mmol/L (ref 22–32)
Calcium: 8.7 mg/dL — ABNORMAL LOW (ref 8.9–10.3)
Chloride: 109 mmol/L (ref 101–111)
Creatinine, Ser: 0.95 mg/dL (ref 0.44–1.00)
GFR calc Af Amer: >60 mL/min (ref >60)
GFR calc non Af Amer: 54 mL/min — ABNORMAL LOW (ref >60)
Glucose, Bld: 88 mg/dL (ref 65–99)
Potassium: 3.8 mmol/L (ref 3.5–5.1)
Sodium: 139 mmol/L (ref 135–145)

## 2017-07-02 LAB — LEGIONELLA PNEUMOPHILA SEROGP 1 UR AG: L. pneumophila Serogp 1 Ur Ag: NEGATIVE

## 2017-07-02 MED ORDER — TRAMADOL HCL 50 MG PO TABS: 50.0000 mg | ORAL_TABLET | Freq: Four times a day (QID) | ORAL | 0 refills | Status: DC | PRN

## 2017-07-02 MED ORDER — AZITHROMYCIN 250 MG PO TABS: ORAL_TABLET | ORAL | 0 refills | Status: DC

## 2017-07-02 MED ORDER — CIPROFLOXACIN HCL 500 MG PO TABS: 500.0000 mg | ORAL_TABLET | Freq: Two times a day (BID) | ORAL | 0 refills | Status: AC

## 2017-07-02 NOTE — Progress Notes (Signed)
Resumed care of this patient from prior nurse and agree with previous assessment.

## 2017-07-02 NOTE — Progress Notes (Signed)
Assessment Principal Problem:   Abdominal pain with colitis-feels better as is going home today Active Problems:   Sleep apnea   PSVT (paroxysmal supraventricular tachycardia) (HCC)   Reducible lumbar hernia-she is not interested in surgery   Plan:  Agree with discharge.  Follow up with [facility] is she decides she is interested in hernia repair   LOS: 1 day        Chief Complaint/Subjective: Feels much better.  Has usual chronic ache at left lumbar hernia site.  Objective: Vital signs in last 24 hours: Temp:  [97.8 F (36.6 C)-98.7 F (37.1 C)] 98.2 F (36.8 C) (10/04 0533) Pulse Rate:  [52-66] 63 (10/04 0911) Resp:  [16-18] 18 (10/04 0533) BP: (109-143)/(55-78) 124/78 (10/04 0911) SpO2:  [95 %-96 %] 96 % (10/04 0533) Last BM Date: 06/30/17  Intake/Output from previous day: 10/03 0701 - 10/04 0700 In: 2078.3 [I.V.:1928.3; IV Piggyback:150] Out: 400 [Urine:400] Intake/Output this shift: No intake/output data recorded.  PE: General- In NAD.  Awake and alert. Abdomen-soft, not tender, reducible left lumbar hernia  Lab Results:   Recent Labs  07/01/17 0718 07/02/17 0418  WBC 8.3 10.1  HGB 14.1 11.2*  HCT 40.8 32.9*  PLT 272 224   BMET  Recent Labs  07/01/17 0432 07/02/17 0418  NA 134* 139  K 3.6 3.8  CL 101 109  CO2 24 24  GLUCOSE 102* 88  BUN 13 9  CREATININE 0.82 0.95  CALCIUM 9.3 8.7*   PT/INR No results for input(s): LABPROT, INR in the last 72 hours. Comprehensive Metabolic Panel:    Component Value Date/Time   NA 139 07/02/2017 0418   NA 134 (L) 07/01/2017 0432   K 3.8 07/02/2017 0418   K 3.6 07/01/2017 0432   CL 109 07/02/2017 0418   CL 101 07/01/2017 0432   CO2 24 07/02/2017 0418   CO2 24 07/01/2017 0432   BUN 9 07/02/2017 0418   BUN 13 07/01/2017 0432   CREATININE 0.95 07/02/2017 0418   CREATININE 0.82 07/01/2017 0432   CREATININE 0.85 12/03/2016 0845   GLUCOSE 88 07/02/2017 0418   GLUCOSE 102 (H) 07/01/2017 0432   CALCIUM 8.7 (L)  07/02/2017 0418   CALCIUM 9.3 07/01/2017 0432   AST 28 07/01/2017 0820   AST 40 06/30/2017 2120   ALT 16 07/01/2017 0820   ALT 17 06/30/2017 2120   ALKPHOS 40 07/01/2017 0820   ALKPHOS 46 06/30/2017 2120   BILITOT 1.3 (H) 07/01/2017 0820   BILITOT 1.4 (H) 06/30/2017 2120   PROT 7.2 07/01/2017 0820   PROT 8.2 (H) 06/30/2017 2120   ALBUMIN 4.4 07/01/2017 0820   ALBUMIN 5.0 06/30/2017 2120     Studies/Results: Dg Chest 2 View  Result Date: 06/30/2017 CLINICAL DATA:  Shortness of breath and nausea EXAM: CHEST  2 VIEW COMPARISON:  Chest radiograph 11/29/2012 FINDINGS: The heart size and mediastinal contours are within normal limits. Both lungs are clear. There is thoracolumbar dextroscoliosis. IMPRESSION: No active cardiopulmonary disease. Electronically Signed   By: Ulyses Jarred M.D.   On: 06/30/2017 21:03   Ct Angio Chest Pe W And/or Wo Contrast  Result Date: 07/01/2017 CLINICAL DATA:  Left-sided pain since this morning. Shortness breath and cough. EXAM: CT ANGIOGRAPHY CHEST CT ABDOMEN AND PELVIS WITH CONTRAST TECHNIQUE: Multidetector CT imaging of the chest was performed using the standard protocol during bolus administration of intravenous contrast. Multiplanar CT image reconstructions and MIPs were obtained to evaluate the vascular anatomy. Multidetector CT imaging of the abdomen and  pelvis was performed using the standard protocol during bolus administration of intravenous contrast. CONTRAST:  80 mL Isovue 370 IV. COMPARISON:  CT abdomen/pelvis 02/12/2016 and CT chest 01/28/2012 FINDINGS: CTA CHEST FINDINGS Cardiovascular: Mild cardiomegaly. Minimal calcified plaque over the aortic arch. No evidence of pulmonary embolism. Remaining vascular structures are unremarkable. Mediastinum/Nodes: No significant mediastinal or hilar adenopathy. Remaining mediastinal structures are within normal. Lungs/Pleura: Lungs are adequately inflated and demonstrate a patchy bilateral nodular process some areas  worsened some areas improved compared to the previous exam. Minimal associated bronchiectasis over the anterior right middle lobe. No effusion. Airways are otherwise within normal. Musculoskeletal: Degenerative change of the spine. Curvature of the thoracic spine convex right. Review of the MIP images confirms the above findings. CT ABDOMEN and PELVIS FINDINGS Hepatobiliary: Liver and biliary tree within normal. Possible small amount of gallstones versus sludge. Pancreas: Within normal. Spleen: Within normal. Adrenals/Urinary Tract: Adrenal glands are within normal. Kidneys are normal in size without hydronephrosis or nephrolithiasis. There is a 1.3 cm cyst over the upper pole left kidney unchanged. Ureters and bladder are unremarkable. Stomach/Bowel: Stomach and small bowel are within normal. Appendix not visualized. Minimal diverticulosis of the colon. There is stranding of the pericolonic fat adjacent a segment of descending colon in the left abdomen just below the left ribs anterior to patient's lumbar hernia. This inflammatory change may be due to mild acute colitis or diverticulitis. No free peritoneal air. Vascular/Lymphatic: Very minimal calcified plaque over the abdominal aorta and iliac arteries. No adenopathy. Reproductive: Within normal. Other: There is again evidence patient's posterior left lumbar hernia just below the posterior ribs with a short segment of colon over the hernia defect. Musculoskeletal: Moderate degenerate changes spine with multilevel disc disease over the lumbar spine and moderate biphasic curvature of the thoracolumbar spine. Mild degenerate change of the hips. Review of the MIP images confirms the above findings. IMPRESSION: No evidence of pulmonary embolism. Patchy nodular pattern of opacification bilaterally which is a chronic waxing and waning process likely representing atypical infection or inflammatory process. Consider followup CT 6-8 weeks. Evidence of patient's posterior  left lumbar hernia just below the posterior ribs with herniation of a short segment of colon. There is inflammatory change adjacent the colon in the left mid abdomen involving the descending colon. This may be due to mild acute colitis or diverticulitis. No perforation or abscess. Possible small amount of gallbladder sludge versus stones. Aortic Atherosclerosis (ICD10-I70.0). 1.3 cm cyst over the upper pole left kidney unchanged. Significant degenerative changes of the spinal multilevel disc disease and moderate biphasic curvature of the thoracolumbar spine. Electronically Signed   By: Marin Olp M.D.   On: 07/01/2017 02:57   US Abdomen Complete  Result Date: 07/01/2017 CLINICAL DATA:  Abdominal pain EXAM: ABDOMEN ULTRASOUND COMPLETE COMPARISON:  07/01/2017 FINDINGS: Gallbladder: Well distended with tiny gallstones. No obstructive changes are seen. No pericholecystic fluid is noted. Common bile duct: Diameter: 4.2 mm. Liver: No focal lesion identified. Within normal limits in parenchymal echogenicity. Portal vein is patent on color Doppler imaging with normal direction of blood flow towards the liver. IVC: No abnormality visualized. Pancreas: Visualized portion unremarkable. Spleen: Not visualized but within normal limits on recent CT examination. Right Kidney: Length: 9.7 cm. Echogenicity within normal limits. No mass or hydronephrosis visualized. Left Kidney: Length: 10.2 cm. Upper pole cyst is again noted measuring 1.8 cm. Abdominal aorta: No aneurysm visualized. Other findings: None. IMPRESSION: Cholelithiasis without complicating factors. Left renal cysts stable from recent CT. Electronically Signed  By: Inez Catalina M.D.   On: 07/01/2017 08:57   Ct Abdomen Pelvis W Contrast  Result Date: 07/01/2017 CLINICAL DATA:  Left-sided pain since this morning. Shortness breath and cough. EXAM: CT ANGIOGRAPHY CHEST CT ABDOMEN AND PELVIS WITH CONTRAST TECHNIQUE: Multidetector CT imaging of the chest was  performed using the standard protocol during bolus administration of intravenous contrast. Multiplanar CT image reconstructions and MIPs were obtained to evaluate the vascular anatomy. Multidetector CT imaging of the abdomen and pelvis was performed using the standard protocol during bolus administration of intravenous contrast. CONTRAST:  80 mL Isovue 370 IV. COMPARISON:  CT abdomen/pelvis 02/12/2016 and CT chest 01/28/2012 FINDINGS: CTA CHEST FINDINGS Cardiovascular: Mild cardiomegaly. Minimal calcified plaque over the aortic arch. No evidence of pulmonary embolism. Remaining vascular structures are unremarkable. Mediastinum/Nodes: No significant mediastinal or hilar adenopathy. Remaining mediastinal structures are within normal. Lungs/Pleura: Lungs are adequately inflated and demonstrate a patchy bilateral nodular process some areas worsened some areas improved compared to the previous exam. Minimal associated bronchiectasis over the anterior right middle lobe. No effusion. Airways are otherwise within normal. Musculoskeletal: Degenerative change of the spine. Curvature of the thoracic spine convex right. Review of the MIP images confirms the above findings. CT ABDOMEN and PELVIS FINDINGS Hepatobiliary: Liver and biliary tree within normal. Possible small amount of gallstones versus sludge. Pancreas: Within normal. Spleen: Within normal. Adrenals/Urinary Tract: Adrenal glands are within normal. Kidneys are normal in size without hydronephrosis or nephrolithiasis. There is a 1.3 cm cyst over the upper pole left kidney unchanged. Ureters and bladder are unremarkable. Stomach/Bowel: Stomach and small bowel are within normal. Appendix not visualized. Minimal diverticulosis of the colon. There is stranding of the pericolonic fat adjacent a segment of descending colon in the left abdomen just below the left ribs anterior to patient's lumbar hernia. This inflammatory change may be due to mild acute colitis or  diverticulitis. No free peritoneal air. Vascular/Lymphatic: Very minimal calcified plaque over the abdominal aorta and iliac arteries. No adenopathy. Reproductive: Within normal. Other: There is again evidence patient's posterior left lumbar hernia just below the posterior ribs with a short segment of colon over the hernia defect. Musculoskeletal: Moderate degenerate changes spine with multilevel disc disease over the lumbar spine and moderate biphasic curvature of the thoracolumbar spine. Mild degenerate change of the hips. Review of the MIP images confirms the above findings. IMPRESSION: No evidence of pulmonary embolism. Patchy nodular pattern of opacification bilaterally which is a chronic waxing and waning process likely representing atypical infection or inflammatory process. Consider followup CT 6-8 weeks. Evidence of patient's posterior left lumbar hernia just below the posterior ribs with herniation of a short segment of colon. There is inflammatory change adjacent the colon in the left mid abdomen involving the descending colon. This may be due to mild acute colitis or diverticulitis. No perforation or abscess. Possible small amount of gallbladder sludge versus stones. Aortic Atherosclerosis (ICD10-I70.0). 1.3 cm cyst over the upper pole left kidney unchanged. Significant degenerative changes of the spinal multilevel disc disease and moderate biphasic curvature of the thoracolumbar spine. Electronically Signed   By: Marin Olp M.D.   On: 07/01/2017 02:57    Anti-infectives: Anti-infectives    Start     Dose/Rate Route Frequency Ordered Stop   07/02/17 0000  azithromycin (ZITHROMAX) 250 MG tablet        07/02/17 0851 07/05/17 2359   07/02/17 0000  ciprofloxacin (CIPRO) 500 MG tablet     500 mg Oral 2 times daily  07/02/17 0851 07/12/17 2359   07/01/17 1200  piperacillin-tazobactam (ZOSYN) IVPB 3.375 g     3.375 g 12.5 mL/hr over 240 Minutes Intravenous Every 8 hours 07/01/17 0445     07/01/17  1200  azithromycin (ZITHROMAX) 500 mg in dextrose 5 % 250 mL IVPB  Status:  Discontinued     500 mg 250 mL/hr over 60 Minutes Intravenous Every 24 hours 07/01/17 0732 07/01/17 0742   07/01/17 1000  azithromycin (ZITHROMAX) tablet 500 mg     500 mg Oral Daily 07/01/17 0742     07/01/17 0415  piperacillin-tazobactam (ZOSYN) IVPB 3.375 g     3.375 g 100 mL/hr over 30 Minutes Intravenous  Once 07/01/17 0411 07/01/17 0559       Glen Blatchley J 07/02/2017

## 2017-07-02 NOTE — Progress Notes (Signed)
Pt discharged with daughter. Discharged with all medications. VSS. All belongings discharged with pt. Discharge teaching done via teach back method. No questions regarding discharge

## 2017-07-02 NOTE — Discharge Summary (Signed)
Physician Discharge Summary  Janet Nguyen QMV:[id number] DOB: 1932-01-26 DOA: 06/30/2017  PCP: Lawerance Cruel, MD  Admit date: 06/30/2017 Discharge date: 07/02/2017  Admitted From:  Disposition:    Recommendations for Outpatient Follow-up:  1. Follow up with PCP in 1-2 weeks 2. Please obtain BMP/CBC in one week  Home Health Equipment/Devices:  Discharge Condition: CODE STATUS Diet recommendation:   Brief/Interim Summary:83 y.o. female with history of hypertension, SVT, celiac disease presents to the ER with complaints of worsening nausea since yesterday morning. Also has been having some abdominal discomfort with some chest discomfort. Chest discomfort is diffuse across the chest and patient is not able to exactly describe the chest pain characterization. Patient has had breakfast following which patient had persistent nausea. Abdominal discomfort is mostly in the lower quadrants and left flank which patient states is been chronic.   ED Course: In the ER labs revealed metabolic acidosis. CT of the chest and abdomen was done. CT chest shows possible inflammatory versus infectious changes and CT abdomen shows lumbar hernia which is known for the patient with possible colitis. Also is showing gallbladder sludge. Patient was given fluid bolus and antibiotics and admitted for further management. Patient had gall baldder ultrasound which showed small gall stones.no evidence of inflamation.echo showed ef 55 percent.patient did well.she tolerated diet.she is requesting to be discharged today.since she improved on iv antibiotics and tolerated po intake will paln dc home today.  Discharge Diagnoses:  Principal Problem:   Abdominal pain Active Problems:   Sleep apnea   PSVT (paroxysmal supraventricular tachycardia) (HCC)  Colitis finish the course of cipro and azithromycin.  Discharge Instructions follow up with dr Harrington Challenger.get a ct scan of chest due to some nonspecific inflamatory changes  seen by ct scan.   Allergies as of 07/02/2017      Reactions   Gluten Meal    Unknown   Codeine Nausea Only      Medication List    STOP taking these medications   acetaminophen 500 MG tablet Commonly known as:  TYLENOL   EQL SINUS CONGESTION/PAIN DAY PO   FIBER COMPLETE PO     TAKE these medications   amLODipine 2.5 MG tablet Commonly known as:  NORVASC TAKE ONE TABLET BY MOUTH DAILY   azithromycin 250 MG tablet Commonly known as:  ZITHROMAX Take one tablet daily for 4 days   ciprofloxacin 500 MG tablet Commonly known as:  CIPRO Take 1 tablet (500 mg total) by mouth 2 (two) times daily.   metoprolol succinate 50 MG 24 hr tablet Commonly known as:  TOPROL-XL Take 1 and 1/2 tablets ( total 75 mg) by mouth twice  daily   multivitamin with minerals Tabs tablet Take 2 tablets by mouth daily.   pantoprazole 40 MG tablet Commonly known as:  PROTONIX Take 40 mg by mouth daily.   traMADol 50 MG tablet Commonly known as:  ULTRAM Take 1 tablet (50 mg total) by mouth every 6 (six) hours as needed for moderate pain.   zolpidem 5 MG tablet Commonly known as:  AMBIEN Take 1 tablet (5 mg total) by mouth at bedtime as needed. for sleep       Allergies  Allergen Reactions  . Gluten Meal     Unknown  . Codeine Nausea Only    Consultations:  surgery   Procedures/Studies: Dg Chest 2 View  Result Date: 06/30/2017 CLINICAL DATA:  Shortness of breath and nausea EXAM: CHEST  2 VIEW COMPARISON:  Chest radiograph 11/29/2012 FINDINGS:  The heart size and mediastinal contours are within normal limits. Both lungs are clear. There is thoracolumbar dextroscoliosis. IMPRESSION: No active cardiopulmonary disease. Electronically Signed   By: Ulyses Jarred M.D.   On: 06/30/2017 21:03   Ct Angio Chest Pe W And/or Wo Contrast  Result Date: 07/01/2017 CLINICAL DATA:  Left-sided pain since this morning. Shortness breath and cough. EXAM: CT ANGIOGRAPHY CHEST CT ABDOMEN AND PELVIS  WITH CONTRAST TECHNIQUE: Multidetector CT imaging of the chest was performed using the standard protocol during bolus administration of intravenous contrast. Multiplanar CT image reconstructions and MIPs were obtained to evaluate the vascular anatomy. Multidetector CT imaging of the abdomen and pelvis was performed using the standard protocol during bolus administration of intravenous contrast. CONTRAST:  80 mL Isovue 370 IV. COMPARISON:  CT abdomen/pelvis 02/12/2016 and CT chest 01/28/2012 FINDINGS: CTA CHEST FINDINGS Cardiovascular: Mild cardiomegaly. Minimal calcified plaque over the aortic arch. No evidence of pulmonary embolism. Remaining vascular structures are unremarkable. Mediastinum/Nodes: No significant mediastinal or hilar adenopathy. Remaining mediastinal structures are within normal. Lungs/Pleura: Lungs are adequately inflated and demonstrate a patchy bilateral nodular process some areas worsened some areas improved compared to the previous exam. Minimal associated bronchiectasis over the anterior right middle lobe. No effusion. Airways are otherwise within normal. Musculoskeletal: Degenerative change of the spine. Curvature of the thoracic spine convex right. Review of the MIP images confirms the above findings. CT ABDOMEN and PELVIS FINDINGS Hepatobiliary: Liver and biliary tree within normal. Possible small amount of gallstones versus sludge. Pancreas: Within normal. Spleen: Within normal. Adrenals/Urinary Tract: Adrenal glands are within normal. Kidneys are normal in size without hydronephrosis or nephrolithiasis. There is a 1.3 cm cyst over the upper pole left kidney unchanged. Ureters and bladder are unremarkable. Stomach/Bowel: Stomach and small bowel are within normal. Appendix not visualized. Minimal diverticulosis of the colon. There is stranding of the pericolonic fat adjacent a segment of descending colon in the left abdomen just below the left ribs anterior to patient's lumbar hernia. This  inflammatory change may be due to mild acute colitis or diverticulitis. No free peritoneal air. Vascular/Lymphatic: Very minimal calcified plaque over the abdominal aorta and iliac arteries. No adenopathy. Reproductive: Within normal. Other: There is again evidence patient's posterior left lumbar hernia just below the posterior ribs with a short segment of colon over the hernia defect. Musculoskeletal: Moderate degenerate changes spine with multilevel disc disease over the lumbar spine and moderate biphasic curvature of the thoracolumbar spine. Mild degenerate change of the hips. Review of the MIP images confirms the above findings. IMPRESSION: No evidence of pulmonary embolism. Patchy nodular pattern of opacification bilaterally which is a chronic waxing and waning process likely representing atypical infection or inflammatory process. Consider followup CT 6-8 weeks. Evidence of patient's posterior left lumbar hernia just below the posterior ribs with herniation of a short segment of colon. There is inflammatory change adjacent the colon in the left mid abdomen involving the descending colon. This may be due to mild acute colitis or diverticulitis. No perforation or abscess. Possible small amount of gallbladder sludge versus stones. Aortic Atherosclerosis (ICD10-I70.0). 1.3 cm cyst over the upper pole left kidney unchanged. Significant degenerative changes of the spinal multilevel disc disease and moderate biphasic curvature of the thoracolumbar spine. Electronically Signed   By: Marin Olp M.D.   On: 07/01/2017 02:57   US Abdomen Complete  Result Date: 07/01/2017 CLINICAL DATA:  Abdominal pain EXAM: ABDOMEN ULTRASOUND COMPLETE COMPARISON:  07/01/2017 FINDINGS: Gallbladder: Well distended with tiny gallstones. No  obstructive changes are seen. No pericholecystic fluid is noted. Common bile duct: Diameter: 4.2 mm. Liver: No focal lesion identified. Within normal limits in parenchymal echogenicity. Portal vein  is patent on color Doppler imaging with normal direction of blood flow towards the liver. IVC: No abnormality visualized. Pancreas: Visualized portion unremarkable. Spleen: Not visualized but within normal limits on recent CT examination. Right Kidney: Length: 9.7 cm. Echogenicity within normal limits. No mass or hydronephrosis visualized. Left Kidney: Length: 10.2 cm. Upper pole cyst is again noted measuring 1.8 cm. Abdominal aorta: No aneurysm visualized. Other findings: None. IMPRESSION: Cholelithiasis without complicating factors. Left renal cysts stable from recent CT. Electronically Signed   By: Inez Catalina M.D.   On: 07/01/2017 08:57   Ct Abdomen Pelvis W Contrast  Result Date: 07/01/2017 CLINICAL DATA:  Left-sided pain since this morning. Shortness breath and cough. EXAM: CT ANGIOGRAPHY CHEST CT ABDOMEN AND PELVIS WITH CONTRAST TECHNIQUE: Multidetector CT imaging of the chest was performed using the standard protocol during bolus administration of intravenous contrast. Multiplanar CT image reconstructions and MIPs were obtained to evaluate the vascular anatomy. Multidetector CT imaging of the abdomen and pelvis was performed using the standard protocol during bolus administration of intravenous contrast. CONTRAST:  80 mL Isovue 370 IV. COMPARISON:  CT abdomen/pelvis 02/12/2016 and CT chest 01/28/2012 FINDINGS: CTA CHEST FINDINGS Cardiovascular: Mild cardiomegaly. Minimal calcified plaque over the aortic arch. No evidence of pulmonary embolism. Remaining vascular structures are unremarkable. Mediastinum/Nodes: No significant mediastinal or hilar adenopathy. Remaining mediastinal structures are within normal. Lungs/Pleura: Lungs are adequately inflated and demonstrate a patchy bilateral nodular process some areas worsened some areas improved compared to the previous exam. Minimal associated bronchiectasis over the anterior right middle lobe. No effusion. Airways are otherwise within normal.  Musculoskeletal: Degenerative change of the spine. Curvature of the thoracic spine convex right. Review of the MIP images confirms the above findings. CT ABDOMEN and PELVIS FINDINGS Hepatobiliary: Liver and biliary tree within normal. Possible small amount of gallstones versus sludge. Pancreas: Within normal. Spleen: Within normal. Adrenals/Urinary Tract: Adrenal glands are within normal. Kidneys are normal in size without hydronephrosis or nephrolithiasis. There is a 1.3 cm cyst over the upper pole left kidney unchanged. Ureters and bladder are unremarkable. Stomach/Bowel: Stomach and small bowel are within normal. Appendix not visualized. Minimal diverticulosis of the colon. There is stranding of the pericolonic fat adjacent a segment of descending colon in the left abdomen just below the left ribs anterior to patient's lumbar hernia. This inflammatory change may be due to mild acute colitis or diverticulitis. No free peritoneal air. Vascular/Lymphatic: Very minimal calcified plaque over the abdominal aorta and iliac arteries. No adenopathy. Reproductive: Within normal. Other: There is again evidence patient's posterior left lumbar hernia just below the posterior ribs with a short segment of colon over the hernia defect. Musculoskeletal: Moderate degenerate changes spine with multilevel disc disease over the lumbar spine and moderate biphasic curvature of the thoracolumbar spine. Mild degenerate change of the hips. Review of the MIP images confirms the above findings. IMPRESSION: No evidence of pulmonary embolism. Patchy nodular pattern of opacification bilaterally which is a chronic waxing and waning process likely representing atypical infection or inflammatory process. Consider followup CT 6-8 weeks. Evidence of patient's posterior left lumbar hernia just below the posterior ribs with herniation of a short segment of colon. There is inflammatory change adjacent the colon in the left mid abdomen involving the  descending colon. This may be due to mild acute colitis or diverticulitis.  No perforation or abscess. Possible small amount of gallbladder sludge versus stones. Aortic Atherosclerosis (ICD10-I70.0). 1.3 cm cyst over the upper pole left kidney unchanged. Significant degenerative changes of the spinal multilevel disc disease and moderate biphasic curvature of the thoracolumbar spine. Electronically Signed   By: Marin Olp M.D.   On: 07/01/2017 02:57    (Echo, Carotid, EGD, Colonoscopy, ERCP)    Subjective:   Discharge Exam: Vitals:   07/01/17 2240 07/02/17 0533  BP: (!) 118/55 109/65  Pulse: 66 (!) 52  Resp: 16 18  Temp: 98.7 F (37.1 C) 98.2 F (36.8 C)  SpO2: 96% 96%   Vitals:   07/01/17 1140 07/01/17 1603 07/01/17 2240 07/02/17 0533  BP: (!) 143/77 136/72 (!) 118/55 109/65  Pulse: 61 66 66 (!) 52  Resp:  16 16 18   Temp:  97.8 F (36.6 C) 98.7 F (37.1 C) 98.2 F (36.8 C)  TempSrc:  Oral Oral Oral  SpO2:  95% 96% 96%  Weight:      Height:        General: Pt is alert, awake, not in acute distress Cardiovascular: RRR, S1/S2 +, no rubs, no gallops Respiratory: CTA bilaterally, no wheezing, no rhonchi Abdominal: Soft, NT, ND, bowel sounds + Extremities: no edema, no cyanosis    The results of significant diagnostics from this hospitalization (including imaging, microbiology, ancillary and laboratory) are listed below for reference.     Microbiology: No results found for this or any previous visit (from the past 240 hour(s)).   Labs: BNP (last 3 results) No results for input(s): BNP in the last 8760 hours. Basic Metabolic Panel:  Recent Labs Lab 06/30/17 2120 07/01/17 0432 07/02/17 0418  NA 132* 134* 139  K 3.6 3.6 3.8  CL 97* 101 109  CO2 18* 24 24  GLUCOSE 121* 102* 88  BUN 13 13 9   CREATININE 0.86 0.82 0.95  CALCIUM 10.2 9.3 8.7*   Liver Function Tests:  Recent Labs Lab 06/30/17 2120 07/01/17 0820  AST 40 28  ALT 17 16  ALKPHOS 46 40   BILITOT 1.4* 1.3*  PROT 8.2* 7.2  ALBUMIN 5.0 4.4    Recent Labs Lab 06/30/17 2120  LIPASE 28   No results for input(s): AMMONIA in the last 168 hours. CBC:  Recent Labs Lab 06/30/17 2120 07/01/17 0718 07/02/17 0418  WBC 12.1* 8.3 10.1  NEUTROABS 7.6  --   --   HGB 14.7 14.1 11.2*  HCT 40.7 40.8 32.9*  MCV 89.5 90.1 93.5  PLT 278 272 224   Cardiac Enzymes:  Recent Labs Lab 07/01/17 0432 07/01/17 1111 07/01/17 1559  TROPONINI <0.03 <0.03 <0.03   BNP: Invalid input(s): POCBNP CBG: No results for input(s): GLUCAP in the last 168 hours. D-Dimer No results for input(s): DDIMER in the last 72 hours. Hgb A1c No results for input(s): HGBA1C in the last 72 hours. Lipid Profile No results for input(s): CHOL, HDL, LDLCALC, TRIG, CHOLHDL, LDLDIRECT in the last 72 hours. Thyroid function studies No results for input(s): TSH, T4TOTAL, T3FREE, THYROIDAB in the last 72 hours.  Invalid input(s): FREET3 Anemia work up No results for input(s): VITAMINB12, FOLATE, FERRITIN, TIBC, IRON, RETICCTPCT in the last 72 hours. Urinalysis    Component Value Date/Time   COLORURINE STRAW (A) 06/30/2017 2040   APPEARANCEUR CLEAR 06/30/2017 2040   LABSPEC 1.008 06/30/2017 2040   PHURINE 9.0 (H) 06/30/2017 2040   GLUCOSEU NEGATIVE 06/30/2017 2040   HGBUR NEGATIVE 06/30/2017 2040   BILIRUBINUR NEGATIVE 06/30/2017  2040   KETONESUR 20 (A) 06/30/2017 2040   PROTEINUR NEGATIVE 06/30/2017 2040   NITRITE NEGATIVE 06/30/2017 2040   LEUKOCYTESUR NEGATIVE 06/30/2017 2040   Sepsis Labs Invalid input(s): PROCALCITONIN,  WBC,  LACTICIDVEN Microbiology No results found for this or any previous visit (from the past 240 hour(s)).   Time coordinating discharge: Over 30 minutes  SIGNED:   Georgette Shell, MD  [facility] 07/02/2017, 8:53 AM    If 7PM-7AM, please contact night-coverage [email or web address] Password [email or web address]

## 2017-07-03 DIAGNOSIS — M545 Low back pain: Secondary | ICD-10-CM | POA: Diagnosis not present

## 2017-07-03 DIAGNOSIS — L659 Nonscarring hair loss, unspecified: Secondary | ICD-10-CM | POA: Diagnosis not present

## 2017-07-03 DIAGNOSIS — K529 Noninfective gastroenteritis and colitis, unspecified: Secondary | ICD-10-CM | POA: Diagnosis not present

## 2017-07-06 LAB — CULTURE, BLOOD (ROUTINE X 2)
Culture: NO GROWTH
Culture: NO GROWTH
Special Requests: ADEQUATE
Special Requests: ADEQUATE

## 2017-07-15 DIAGNOSIS — K458 Other specified abdominal hernia without obstruction or gangrene: Secondary | ICD-10-CM | POA: Diagnosis not present

## 2017-07-15 DIAGNOSIS — K5732 Diverticulitis of large intestine without perforation or abscess without bleeding: Secondary | ICD-10-CM | POA: Diagnosis not present

## 2017-07-24 DIAGNOSIS — Z23 Encounter for immunization: Secondary | ICD-10-CM | POA: Diagnosis not present

## 2017-08-13 ENCOUNTER — Ambulatory Visit: Payer: Medicare Other | Admitting: Cardiovascular Disease

## 2017-09-25 DIAGNOSIS — B351 Tinea unguium: Secondary | ICD-10-CM | POA: Diagnosis not present

## 2017-09-25 DIAGNOSIS — M179 Osteoarthritis of knee, unspecified: Secondary | ICD-10-CM | POA: Diagnosis not present

## 2017-09-25 DIAGNOSIS — F419 Anxiety disorder, unspecified: Secondary | ICD-10-CM | POA: Diagnosis not present

## 2017-09-25 DIAGNOSIS — G479 Sleep disorder, unspecified: Secondary | ICD-10-CM | POA: Diagnosis not present

## 2017-09-25 DIAGNOSIS — M549 Dorsalgia, unspecified: Secondary | ICD-10-CM | POA: Diagnosis not present

## 2017-10-08 DIAGNOSIS — H26493 Other secondary cataract, bilateral: Secondary | ICD-10-CM | POA: Diagnosis not present

## 2017-10-08 DIAGNOSIS — H43813 Vitreous degeneration, bilateral: Secondary | ICD-10-CM | POA: Diagnosis not present

## 2017-10-08 DIAGNOSIS — H353132 Nonexudative age-related macular degeneration, bilateral, intermediate dry stage: Secondary | ICD-10-CM | POA: Diagnosis not present

## 2017-10-08 DIAGNOSIS — H40013 Open angle with borderline findings, low risk, bilateral: Secondary | ICD-10-CM | POA: Diagnosis not present

## 2017-10-29 DIAGNOSIS — R413 Other amnesia: Secondary | ICD-10-CM | POA: Diagnosis not present

## 2017-10-29 DIAGNOSIS — M419 Scoliosis, unspecified: Secondary | ICD-10-CM | POA: Diagnosis not present

## 2017-10-29 DIAGNOSIS — R300 Dysuria: Secondary | ICD-10-CM | POA: Diagnosis not present

## 2017-10-29 DIAGNOSIS — R109 Unspecified abdominal pain: Secondary | ICD-10-CM | POA: Diagnosis not present

## 2017-10-29 DIAGNOSIS — F419 Anxiety disorder, unspecified: Secondary | ICD-10-CM | POA: Diagnosis not present

## 2017-11-23 DIAGNOSIS — G4733 Obstructive sleep apnea (adult) (pediatric): Secondary | ICD-10-CM | POA: Diagnosis not present

## 2017-11-23 DIAGNOSIS — K219 Gastro-esophageal reflux disease without esophagitis: Secondary | ICD-10-CM | POA: Diagnosis not present

## 2017-11-23 DIAGNOSIS — J310 Chronic rhinitis: Secondary | ICD-10-CM | POA: Diagnosis not present

## 2017-11-23 DIAGNOSIS — H9113 Presbycusis, bilateral: Secondary | ICD-10-CM | POA: Diagnosis not present

## 2017-12-04 DIAGNOSIS — K219 Gastro-esophageal reflux disease without esophagitis: Secondary | ICD-10-CM | POA: Diagnosis not present

## 2017-12-04 DIAGNOSIS — M7918 Myalgia, other site: Secondary | ICD-10-CM | POA: Diagnosis not present

## 2017-12-05 ENCOUNTER — Emergency Department (HOSPITAL_COMMUNITY)
Admission: EM | Admit: 2017-12-05 | Discharge: 2017-12-05 | Disposition: A | Discharge status: Home / Self Care | Payer: Medicare Other | Attending: Emergency Medicine | Admitting: Emergency Medicine

## 2017-12-05 ENCOUNTER — Encounter (HOSPITAL_COMMUNITY): Payer: Self-pay

## 2017-12-05 ENCOUNTER — Other Ambulatory Visit: Payer: Self-pay

## 2017-12-05 DIAGNOSIS — R197 Diarrhea, unspecified: Secondary | ICD-10-CM | POA: Diagnosis not present

## 2017-12-05 DIAGNOSIS — Z79899 Other long term (current) drug therapy: Secondary | ICD-10-CM | POA: Diagnosis not present

## 2017-12-05 DIAGNOSIS — R110 Nausea: Secondary | ICD-10-CM | POA: Insufficient documentation

## 2017-12-05 DIAGNOSIS — K5792 Diverticulitis of intestine, part unspecified, without perforation or abscess without bleeding: Secondary | ICD-10-CM | POA: Diagnosis not present

## 2017-12-05 DIAGNOSIS — R1032 Left lower quadrant pain: Secondary | ICD-10-CM | POA: Diagnosis present

## 2017-12-05 LAB — COMPREHENSIVE METABOLIC PANEL
ALT: 18 U/L (ref 14–54)
AST: 30 U/L (ref 15–41)
Albumin: 4.4 g/dL (ref 3.5–5.0)
Alkaline Phosphatase: 55 U/L (ref 38–126)
Anion gap: 10 (ref 5–15)
BUN: 13 mg/dL (ref 6–20)
CO2: 26 mmol/L (ref 22–32)
Calcium: 9.6 mg/dL (ref 8.9–10.3)
Chloride: 100 mmol/L — ABNORMAL LOW (ref 101–111)
Creatinine, Ser: 0.87 mg/dL (ref 0.44–1.00)
GFR calc Af Amer: >60 mL/min (ref >60)
GFR calc non Af Amer: 60 mL/min — ABNORMAL LOW (ref >60)
Glucose, Bld: 129 mg/dL — ABNORMAL HIGH (ref 65–99)
Potassium: 3.8 mmol/L (ref 3.5–5.1)
Sodium: 136 mmol/L (ref 135–145)
Total Bilirubin: 0.6 mg/dL (ref 0.3–1.2)
Total Protein: 7.6 g/dL (ref 6.5–8.1)

## 2017-12-05 LAB — CBC
HCT: 36 % (ref 36.0–46.0)
Hemoglobin: 12 g/dL (ref 12.0–15.0)
MCH: 31.2 pg (ref 26.0–34.0)
MCHC: 33.3 g/dL (ref 30.0–36.0)
MCV: 93.5 fL (ref 78.0–100.0)
Platelets: 288 10*3/uL (ref 150–400)
RBC: 3.85 MIL/uL — ABNORMAL LOW (ref 3.87–5.11)
RDW: 13 % (ref 11.5–15.5)
WBC: 9.2 10*3/uL (ref 4.0–10.5)

## 2017-12-05 LAB — URINALYSIS, ROUTINE W REFLEX MICROSCOPIC
Bilirubin Urine: NEGATIVE
Glucose, UA: NEGATIVE mg/dL
Hgb urine dipstick: NEGATIVE
Ketones, ur: 5 mg/dL — AB
Leukocytes, UA: NEGATIVE
Nitrite: NEGATIVE
Protein, ur: NEGATIVE mg/dL
Specific Gravity, Urine: 1.003 — ABNORMAL LOW (ref 1.005–1.030)
pH: 7 (ref 5.0–8.0)

## 2017-12-05 LAB — LIPASE, BLOOD: Lipase: 39 U/L (ref 11–51)

## 2017-12-05 MED ORDER — AMOXICILLIN-POT CLAVULANATE 400-57 MG/5ML PO SUSR: 500.0000 mg | Freq: Two times a day (BID) | ORAL | 0 refills | Status: AC

## 2017-12-05 MED ORDER — AMOXICILLIN-POT CLAVULANATE 400-57 MG/5ML PO SUSR
1000.0000 mg | Freq: Once | ORAL | Status: AC
  Administered 2017-12-05: 1000 mg via ORAL
  Filled 2017-12-05: qty 12.5

## 2017-12-05 NOTE — ED Provider Notes (Signed)
Medical screening examination/treatment/procedure(s) were conducted as a shared visit with non-physician practitioner(s) and myself.  I personally evaluated the patient during the encounter. Briefly, the patient is a 84 y.o. female here with several days of left sided pain with change in bowel habit and associated nausea and dry heaving similar to prior Diverticulitis/Colitis episode. She did report dysuria as well. On exam she has left flank TTP. Labs reassuring. UA negative. Will treat for presumed diverticulitis. The patient is safe for discharge with strict return precautions.    EKG Interpretation None           Melana Hingle, Grayce Sessions, MD 12/05/17 2215

## 2017-12-05 NOTE — ED Triage Notes (Signed)
Abdominal pain since October 2018 and never went away saw gastroenterologist yesterday for same today nausea and vomiting no fever noted.

## 2017-12-05 NOTE — ED Provider Notes (Signed)
[facility] DEPT Provider Note   CSN: [id number] Arrival date & time: 12/05/17  1953     History   Chief Complaint Chief Complaint  Patient presents with  . Abdominal Pain    HPI CAHTERINE Nguyen is a 84 y.o. female with a past medical history of diverticulitis, who presents to ED for evaluation of one-week history of left-sided abdominal pain, back pain, nausea which she states is similar to her prior diverticulitis and colitis episodes.  Reports symptoms are similar to when she presented to the ED 5 months ago.  Patient tells me that she is here for a shot of antibiotics and a prescription for antibiotics because she knows it that this feels exactly like her diverticulitis.  Hematemesis, hematochezia, melena, fevers, dysuria, hematuria or sick contacts with similar symptoms.  HPI  Past Medical History:  Diagnosis Date  . Arrhythmia    History of SVT with documented PVC'S and  PAC'S  12/08/12 Nuc stress test normal LV EF 74%  Event Monitor  12/01/12-01/03/13  . Celiac disease    treated by Dr. [name]  . GERD (gastroesophageal reflux disease)   . Intervertebral disc stenosis of neural canal of cervical region   . Irregular heart beat 11/30/12   ECHO-EF 60-65%  . Osteoporosis   . Scoliosis   . Scoliosis   . Sleep apnea 10/02/11 [facility]   Sleep study AHI -total sleep 10.3/hr  64.0/ hr during REM sleep.RDI 22.8/hr during total sleep 64.0/hr during REM sleep The lowest O2 sat during Non-REM and REM sleep was 86% and 88% respectively. 04/08/12 CPAP/BIPAP titration study [facility]    Patient Active Problem List   Diagnosis Date Noted  . Abdominal pain 07/01/2017  . Diverticulitis, colon   . Metabolic acidosis, increased anion gap   . Fatigue 12/23/2015  . Sciatica of right side 10/06/2015  . History of migraine headaches 10/06/2015  . Frequent PVCs 12/28/2013  . Premature atrial contractions 12/28/2013  .  PSVT (paroxysmal supraventricular tachycardia) ([place]) 12/28/2013  . Heart palpitations 07/13/2013  . Sleep apnea 04/11/2013  . Scoliosis 04/11/2013  . Atrial flutter with rapid ventricular response ([place]) 11/29/2012  . Chest pain, atypical 11/29/2012  . Fibromyalgia syndrome 11/29/2012  . Chronic steroid use 11/29/2012    Past Surgical History:  Procedure Laterality Date  . APPENDECTOMY     ruptured at age 56 and had surgery  . CARDIAC CATHETERIZATION  01/27/06    OB History    No data available       Home Medications    Prior to Admission medications   Medication Sig Start Date End Date Taking? Authorizing Provider  amLODipine (NORVASC) 2.5 MG tablet TAKE ONE TABLET BY MOUTH DAILY 06/02/17   Troy Sine, MD  amoxicillin-clavulanate (AUGMENTIN) 400-57 MG/5ML suspension Take 6.3 mLs (500 mg total) by mouth 2 (two) times daily for 7 days. 12/05/17 12/12/17  Delia Heady, PA-C  azithromycin (ZITHROMAX) 250 MG tablet Take one tablet daily for 4 days 07/02/17 07/05/17  Georgette Shell, MD  metoprolol succinate (TOPROL-XL) 50 MG 24 hr tablet Take 1 and 1/2 tablets ( total 75 mg) by mouth twice  daily 12/03/16   Troy Sine, MD  Multiple Vitamin (MULTIVITAMIN WITH MINERALS) TABS Take 2 tablets by mouth daily.     [provider]  pantoprazole (PROTONIX) 40 MG tablet Take 40 mg by mouth daily. 05/22/17   [provider]  traMADol (ULTRAM) 50 MG  tablet Take 1 tablet (50 mg total) by mouth every 6 (six) hours as needed for moderate pain. 07/02/17   Georgette Shell, MD  zolpidem (AMBIEN) 5 MG tablet Take 1 tablet (5 mg total) by mouth at bedtime as needed. for sleep 12/03/16   Troy Sine, MD    Family History Family History  Problem Relation Age of Onset  . Breast cancer Mother   . Heart disease Father     Social History Social History   Tobacco Use  . Smoking status: Never Smoker  . Smokeless tobacco: Never Used  Substance Use Topics  . Alcohol use:  Yes    Alcohol/week: 0.6 oz    Types: 1 Standard drinks or equivalent per week    Comment: drink ETOH socially  . Drug use: No     Allergies   Gluten meal and Codeine   Review of Systems Review of Systems  Constitutional: Negative for appetite change, chills and fever.  HENT: Negative for ear pain, rhinorrhea, sneezing and sore throat.   Eyes: Negative for photophobia and visual disturbance.  Respiratory: Negative for cough, chest tightness, shortness of breath and wheezing.   Cardiovascular: Negative for chest pain and palpitations.  Gastrointestinal: Positive for abdominal pain, diarrhea and nausea. Negative for blood in stool, constipation and vomiting.  Genitourinary: Negative for dysuria, hematuria and urgency.  Musculoskeletal: Negative for myalgias.  Skin: Negative for rash.  Neurological: Negative for dizziness, weakness and light-headedness.     Physical Exam Updated Vital Signs BP (!) 162/95 (BP Location: Left Arm)   Pulse 60   Temp 97.7 F (36.5 C) (Oral)   Resp 18   Ht 5' (1.524 m)   Wt 49 kg (108 lb)   SpO2 96%   BMI 21.09 kg/m   Physical Exam  Constitutional: She appears well-developed and well-nourished. No distress.  Nontoxic appearing and in no acute distress.  HENT:  Head: Normocephalic and atraumatic.  Nose: Nose normal.  Eyes: Conjunctivae and EOM are normal. Left eye exhibits no discharge. No scleral icterus.  Neck: Normal range of motion. Neck supple.  Cardiovascular: Normal rate, regular rhythm, normal heart sounds and intact distal pulses. Exam reveals no gallop and no friction rub.  No murmur heard. Pulmonary/Chest: Effort normal and breath sounds normal. No respiratory distress.  Abdominal: Soft. Bowel sounds are normal. She exhibits no distension. There is tenderness (L flank). There is no guarding.  Musculoskeletal: Normal range of motion. She exhibits no edema.  Neurological: She is alert. She exhibits normal muscle tone. Coordination  normal.  Skin: Skin is warm and dry. No rash noted.  Psychiatric: She has a normal mood and affect.  Nursing note and vitals reviewed.    ED Treatments / Results  Labs (all labs ordered are listed, but only abnormal results are displayed) Labs Reviewed  COMPREHENSIVE METABOLIC PANEL - Abnormal; Notable for the following components:      Result Value   Chloride 100 (*)    Glucose, Bld 129 (*)    GFR calc non Af Amer 60 (*)    All other components within normal limits  CBC - Abnormal; Notable for the following components:   RBC 3.85 (*)    All other components within normal limits  URINALYSIS, ROUTINE W REFLEX MICROSCOPIC - Abnormal; Notable for the following components:   Color, Urine STRAW (*)    Specific Gravity, Urine 1.003 (*)    Ketones, ur 5 (*)    All other components within normal limits  LIPASE, BLOOD    EKG  EKG Interpretation None       Radiology No results found.  Procedures Procedures (including critical care time)  Medications Ordered in ED Medications  amoxicillin-clavulanate (AUGMENTIN) 400-57 MG/5ML suspension 1,000 mg (not administered)     Initial Impression / Assessment and Plan / ED Course  I have reviewed the triage vital signs and the nursing notes.  Pertinent labs & imaging results that were available during my care of the patient were reviewed by me and considered in my medical decision making (see chart for details).     Patient presents to ED for evaluation of possible diverticulitis and colitis flareup.  States this feels similar to her prior episodes.  On physical exam she is overall well-appearing.  She does have some left-sided tenderness to palpation.  She does not appear dehydrated.  Lab work shows unremarkable CBC, CMP, lipase and urinalysis.  She is afebrile with no history of fever.  Will treat with Augmentin for diverticulitis flareup and advised her to follow-up with her primary care provider for further evaluation. Low  suspicion for other infectious or inflammatory abdominal process. Patient given first dose of antibiotics here and will be discharged with remainder of course.  Patient appears stable for discharge at this time.  Strict return precautions given. Patient d/w and seen by Dr. Leonette Monarch.  Portions of this note were generated with [employer]. Dictation errors may occur despite best attempts at proofreading.   Final Clinical Impressions(s) / ED Diagnoses   Final diagnoses:  Diverticulitis    ED Discharge Orders        Ordered    amoxicillin-clavulanate (AUGMENTIN) 400-57 MG/5ML suspension  2 times daily     12/05/17 2227       Delia Heady, [place] 12/05/17 2234

## 2017-12-05 NOTE — Discharge Instructions (Signed)
Please read attached information regarding your condition. Take Augmentin twice daily for the next 7 days. Follow-up with your primary care provider for further evaluation. Return to ED for worsening symptoms, severe abdominal pain, blood in stool, fever.

## 2017-12-06 ENCOUNTER — Other Ambulatory Visit: Payer: Self-pay | Admitting: Cardiovascular Disease

## 2017-12-08 DIAGNOSIS — R0981 Nasal congestion: Secondary | ICD-10-CM | POA: Diagnosis not present

## 2017-12-08 DIAGNOSIS — G479 Sleep disorder, unspecified: Secondary | ICD-10-CM | POA: Diagnosis not present

## 2017-12-08 DIAGNOSIS — R109 Unspecified abdominal pain: Secondary | ICD-10-CM | POA: Diagnosis not present

## 2018-01-18 DIAGNOSIS — N952 Postmenopausal atrophic vaginitis: Secondary | ICD-10-CM | POA: Diagnosis not present

## 2018-01-18 DIAGNOSIS — R300 Dysuria: Secondary | ICD-10-CM | POA: Diagnosis not present

## 2018-01-18 DIAGNOSIS — L292 Pruritus vulvae: Secondary | ICD-10-CM | POA: Diagnosis not present

## 2018-01-22 DIAGNOSIS — H26493 Other secondary cataract, bilateral: Secondary | ICD-10-CM | POA: Diagnosis not present

## 2018-01-22 DIAGNOSIS — H40013 Open angle with borderline findings, low risk, bilateral: Secondary | ICD-10-CM | POA: Diagnosis not present

## 2018-01-22 DIAGNOSIS — H353132 Nonexudative age-related macular degeneration, bilateral, intermediate dry stage: Secondary | ICD-10-CM | POA: Diagnosis not present

## 2018-01-22 DIAGNOSIS — H43813 Vitreous degeneration, bilateral: Secondary | ICD-10-CM | POA: Diagnosis not present

## 2018-01-22 DIAGNOSIS — H26491 Other secondary cataract, right eye: Secondary | ICD-10-CM | POA: Diagnosis not present

## 2018-02-08 DIAGNOSIS — H26492 Other secondary cataract, left eye: Secondary | ICD-10-CM | POA: Diagnosis not present

## 2018-02-11 DIAGNOSIS — R51 Headache: Secondary | ICD-10-CM | POA: Diagnosis not present

## 2018-02-11 DIAGNOSIS — M545 Low back pain: Secondary | ICD-10-CM | POA: Diagnosis not present

## 2018-02-11 DIAGNOSIS — L659 Nonscarring hair loss, unspecified: Secondary | ICD-10-CM | POA: Diagnosis not present

## 2018-02-11 DIAGNOSIS — M419 Scoliosis, unspecified: Secondary | ICD-10-CM | POA: Diagnosis not present

## 2018-02-11 DIAGNOSIS — L989 Disorder of the skin and subcutaneous tissue, unspecified: Secondary | ICD-10-CM | POA: Diagnosis not present

## 2018-02-11 DIAGNOSIS — I868 Varicose veins of other specified sites: Secondary | ICD-10-CM | POA: Diagnosis not present

## 2018-02-15 ENCOUNTER — Other Ambulatory Visit: Payer: Self-pay | Admitting: Cardiovascular Disease

## 2018-02-16 NOTE — Telephone Encounter (Signed)
REFILL 

## 2018-03-01 ENCOUNTER — Telehealth: Payer: Self-pay | Admitting: Cardiovascular Disease

## 2018-03-01 NOTE — Telephone Encounter (Signed)
Spoke with pt who states she would like to be seen earlier than her appointment 04/14/18 with Dr. [name] to address her concerns with her medication. She states she was told that metoprolol, norvasc, and ambien causing hair and memory loss and have been experiencing both. Routing to Dr [name] for recommendation.

## 2018-03-01 NOTE — Telephone Encounter (Signed)
New message   Pt c/o medication issue:  1. Name of Medication:  metoprolol succinate (TOPROL-XL) 50 MG 24 hr tablet TAKE 1 AND 1/2 TABLETS BY MOUTH 2 TIMES A DAY   amLODipine (NORVASC) 2.5 MG tablet TAKE ONE TABLET BY MOUTH DAILY    zolpidem (AMBIEN) 5 MG tablet Take 1 tablet (5 mg total) by mouth at bedtime as needed. for sleep     2. How are you currently taking this medication (dosage and times per day)? As prescribed  3. Are you having a reaction (difficulty breathing--STAT)?yes    4. What is your medication issue? Hair loss and memory loss

## 2018-03-04 DIAGNOSIS — J310 Chronic rhinitis: Secondary | ICD-10-CM | POA: Diagnosis not present

## 2018-03-04 DIAGNOSIS — D225 Melanocytic nevi of trunk: Secondary | ICD-10-CM | POA: Diagnosis not present

## 2018-03-04 DIAGNOSIS — D1801 Hemangioma of skin and subcutaneous tissue: Secondary | ICD-10-CM | POA: Diagnosis not present

## 2018-03-04 DIAGNOSIS — R490 Dysphonia: Secondary | ICD-10-CM | POA: Diagnosis not present

## 2018-03-04 DIAGNOSIS — R070 Pain in throat: Secondary | ICD-10-CM | POA: Diagnosis not present

## 2018-03-04 DIAGNOSIS — L570 Actinic keratosis: Secondary | ICD-10-CM | POA: Diagnosis not present

## 2018-03-04 DIAGNOSIS — L821 Other seborrheic keratosis: Secondary | ICD-10-CM | POA: Diagnosis not present

## 2018-03-08 DIAGNOSIS — J329 Chronic sinusitis, unspecified: Secondary | ICD-10-CM | POA: Diagnosis not present

## 2018-03-08 DIAGNOSIS — J310 Chronic rhinitis: Secondary | ICD-10-CM | POA: Diagnosis not present

## 2018-03-08 NOTE — Telephone Encounter (Signed)
OV 6/20 with Dr. [name] to discuss

## 2018-03-18 ENCOUNTER — Ambulatory Visit (INDEPENDENT_AMBULATORY_CARE_PROVIDER_SITE_OTHER): Payer: Medicare Other | Admitting: Cardiovascular Disease

## 2018-03-18 ENCOUNTER — Encounter: Payer: Self-pay | Admitting: Cardiovascular Disease

## 2018-03-18 VITALS — BP 144/68 | HR 64 | Ht 62.0 in | Wt 115.0 lb

## 2018-03-18 DIAGNOSIS — I6523 Occlusion and stenosis of bilateral carotid arteries: Secondary | ICD-10-CM | POA: Diagnosis not present

## 2018-03-18 DIAGNOSIS — I10 Essential (primary) hypertension: Secondary | ICD-10-CM

## 2018-03-18 DIAGNOSIS — L659 Nonscarring hair loss, unspecified: Secondary | ICD-10-CM | POA: Diagnosis not present

## 2018-03-18 DIAGNOSIS — M4123 Other idiopathic scoliosis, cervicothoracic region: Secondary | ICD-10-CM | POA: Diagnosis not present

## 2018-03-18 DIAGNOSIS — G4733 Obstructive sleep apnea (adult) (pediatric): Secondary | ICD-10-CM

## 2018-03-18 DIAGNOSIS — M4125 Other idiopathic scoliosis, thoracolumbar region: Secondary | ICD-10-CM | POA: Diagnosis not present

## 2018-03-18 DIAGNOSIS — I272 Pulmonary hypertension, unspecified: Secondary | ICD-10-CM | POA: Diagnosis not present

## 2018-03-18 NOTE — Progress Notes (Signed)
Patient ID: Janet Nguyen, female   DOB: July 12, 1932, 84 y.o.   MRN: [id number]     Primary  M.D.: Dr. Lona Nguyen  HPI: Janet Nguyen is a 84 y.o. female who presents for a 9 month followup cardiology evaluation.   Ms. Janet Nguyen has a history of documented SVT and also has PACs and PVCs  treated with beta blocker therapy. In April 2014 I further titrated her beta blocker therapy after cardiac event monitor revealed several bursts of recurrent SVT up to 177 beats per minute in March.  In July after she had had 2 episodes of chest fluttering which each lasted over 30 minutes which he did take metoprolol tartrate with relief I recommended further titration of her Toprol to 75 mg in the morning and 50 mg at night and if necessary she could further titrate this to 75 twice a day.  She has a history of obstructive sleep apnea but despite multiple attempts at CPAP utilization she has not been able to tolerate this. She was referred to Dr. [name] and has a customized dental appliance with mandibular advancement with improvement in some of her symptomatology. Due concern that her teeth may be moving in more recently she has has not been using customized appliance daily but has been using her old non-customized mouthguard.  At times shen has awakened abruptly from a dream with her heart pounding and a sensation of hot flashes, gasping for breath.   She also states that her scoliosis is getting worse. She does have left-sided musculoskeletal type chest pain due to her spine angulation.  Beause of her significant scoliosis, she feels she must sleep on her back.   She has a history of GERD for which she has been taking Nexium.  She presents for evaluation.  I had scheduled her for an overnight oximetry to see if she is a candidate for supplemental oxygen at nighttime since she refused to use CPAP and only very rarely uses her customized mouthpiece.  She does wear a mouthguard to reduce bruxism.  Oximetry  study was performed overnight on February 23/24.  Her mean oxygen saturation was 93.12%.  She spent 12 minutes and 16 seconds with O2 sat duration below 88% with the lowest O2 sat duration of 80%. I tried to set her up for supplemental oxygen at bedtime.  However, she has been denied for this on multiple occasions by her insurance/Medicare.  She feels that she is sleeping better.  She can only sleep in her back due to scoliosis.  Her left side is concave; her right side is convex.  She has had issues with labile blood pressure. She has had issues with lower back discomfort and sciatica leading to emergency room evaluation in November 2016.  She saw Dr. [name] for neurosurgical evaluation. She has migraine headaches. She  Recently saw Dr. [name] for these migraine headaches.  She has been on Toprol-XL 75 mg twice a day for her palpitations and presently denies any awareness an extra heartbeats. She takes Xanax on an as-needed basis for anxiety.  She has been taking Nexium 20, no grams every other day for GERD. She states her scoliosis is getting worse.  Has been experiencing more fatigue.  She also notes some occasional left hand numbness.  In a mouth guard but no ureteral longer uses her mandibular advancement device and not tolerate CPAP therapy for her obstructive sleep apnea.     She had experienced left-sided chest discomfort which most likely was  related to her significant scoliosis the potential neuropathy causing intermittent left arm and hand numbness.  I slightly reduced her Toprol-XL which she had been taking 150 mg daily and a 75 twice a day regimen to 75 and milligrams in the morning and 50 mg with ultimate plan to decrease this to 50 twice a day.  She states when she reduce this, she began to notice more optical migraines and resume taking the higher dose Toprol at 75 twice a day with improvement.  She has seen Dr. [name] for her paresthesias.  She admits to significant anxiety.  She had requested  zolpidem for sleep initiation and maintenance.  In the past, we had tried 6.25 slow-release version to help with sleep maintenance, but due to cost issues preferred the 5 mg sleep initiation dose.  She experiences optical migraine headaches.  She was being seen by neurologist, Dr. Melton Alar  She continues to have issues with her scoliosis causing discomfort in her neck and arm due to her distortion.  He tells me she underwent endoscopy and colonoscopy as well as banding of hemorrhoids.  She had a benign polyp removed.  She has issues with spastic colon.  Her sleep is better with zolpiden 5 mg.  She saw Kerin Ransom on 11/13/2016.  She had noticed that her hair was "thinning" and was concerned about this being the result of Toprol.  He suggested possibly decreasing Toprol but she preferred not until she had seen me.  Since that time, she continues to experience some optical migraines.  She has noticed some leg left neck discomfort.  She denies any patches of hair loss.  She denies differential arm weakness.  She continues to have difficulty with her scoliosis with hip pain and rib cage discomfort.   When I last saw her in March 2018, there was a blood pressure differential of 118/78 in the left arm and 140/80 in the right arm.  She subsequently underwent upper extremity Doppler evaluation on 12/25/2016.  She was noted have mild heterogeneous plaque with stable 1-39% bilateral internal carotid stenoses.  She had normal subclavian arteries bilaterally.  There are patent vertebral arteries with antegrade flow.  She did not have any significant blood pressure differential and had triphasic waveforms in both her right and biphasic in her left brachial artery.  Left blood pressure was 8 mm higher than the right brachial pressure.  At times, she continues to experience some vague chest wall symptoms, which most likely related to her posture.  She denies significant shortness of breath.  She has to sleep on her back  because of her scoliosis.  Uses a chin strap to prevent oral breathing.  She is not on CPAP.   Since I last saw her in September 2018, she has concerns about some of her medications possibly causing hair loss or memory loss.  She admits to having dry eyes.  She continues to have difficulty from her scoliosis taking sleep difficulty.  In the past she was documented to have obstructive sleep apnea but would not take CPAP.  I had referred her to Dr. Ron Parker remotely.  She has had issues with a raspy voice and was felt by Dr. Erik Obey to be related to dyspepsia.  She also saw Dr. metal who felt perhaps it was more related to nasal drainage.  Any recurrent SVT which has stabilized on her beta-blocker therapy.  She has been taking pantoprazole for GERD.  She presents for evaluation.   Past Medical History:  Diagnosis  Date  . Arrhythmia    History of SVT with documented PVC'S and  PAC'S  12/08/12 Nuc stress test normal LV EF 74%  Event Monitor  12/01/12-01/03/13  . Celiac disease    treated by Dr. [name]  . GERD (gastroesophageal reflux disease)   . Intervertebral disc stenosis of neural canal of cervical region   . Irregular heart beat 11/30/12   ECHO-EF 60-65%  . Osteoporosis   . Scoliosis   . Scoliosis   . Sleep apnea 10/02/11 [facility]   Sleep study AHI -total sleep 10.3/hr  64.0/ hr during REM sleep.RDI 22.8/hr during total sleep 64.0/hr during REM sleep The lowest O2 sat during Non-REM and REM sleep was 86% and 88% respectively. 04/08/12 CPAP/BIPAP titration study [facility]    Past Surgical History:  Procedure Laterality Date  . APPENDECTOMY     ruptured at age 26 and had surgery  . CARDIAC CATHETERIZATION  01/27/06    Allergies  Allergen Reactions  . Gluten Meal     Unknown  . Codeine Nausea Only    Current Outpatient Medications  Medication Sig Dispense Refill  . amLODipine (NORVASC) 2.5 MG tablet TAKE ONE TABLET BY MOUTH DAILY 90 tablet 1  .  metoprolol succinate (TOPROL-XL) 50 MG 24 hr tablet TAKE 1 AND 1/2 TABLETS BY MOUTH 2 TIMES A DAY 270 tablet 2  . Misc Natural Products (TART CHERRY ADVANCED PO) Take by mouth.    . Multiple Vitamins-Minerals (MULTIVITAMIN GUMMIES ADULT PO) Take by mouth.    . pantoprazole (PROTONIX) 20 MG tablet Take 20 mg by mouth daily.     . Polyvinyl Alcohol-Povidone (REFRESH OP) Apply to eye.    . Wheat Dextrin (EQ FIBER POWDER PO) Take by mouth.    . zolpidem (AMBIEN) 5 MG tablet Take 1 tablet (5 mg total) by mouth at bedtime as needed. for sleep 30 tablet 1   No current facility-administered medications for this visit.     Socially she is divorced has 4 children 9 grandchildren. She does exercise. No tobacco use. She does occasional wine.  ROS General: Negative; No fevers, chills, or night sweats;  HEENT: Negative; No changes in vision or hearing, sinus congestion, difficulty swallowing Pulmonary: Negative; No cough, wheezing, shortness of breath, hemoptysis Cardiovascular: Positive for occasional chest wall pain and nocturnal palpitations GI: Negative; No nausea, vomiting, diarrhea, or abdominal pain GU: Negative; No dysuria, hematuria, or difficulty voiding Musculoskeletal: Positive for significant scoliosis; fibromyalgia;  joint pain, or weakness Hematologic/Oncology: Negative; no easy bruising, bleeding Endocrine: Negative; no heat/cold intolerance; no diabetes Neuro: Negative; no changes in balance, headaches Skin: Negative; No rashes or skin lesions Psychiatric: Negative; No behavioral problems, depression Sleep: Positive for sleep apnea ; No snoring, daytime sleepiness, hypersomnolence, bruxism, restless legs, hypnogognic hallucinations, no cataplexy Other comprehensive 14 point system review is negative.  PE BP (!) 144/68   Pulse 64   Ht 5' 2"  (1.575 m)   Wt 115 lb (52.2 kg)   BMI 21.03 kg/m    Repeat blood pressure by me was 140/70  Wt Readings from Last 3 Encounters:    03/18/18 115 lb (52.2 kg)  12/05/17 108 lb (49 kg)  07/01/17 108 lb 6.4 oz (49.2 kg)   General: Alert, oriented, no distress.  Skin: normal turgor, no rashes, warm and dry HEENT: Normocephalic, atraumatic. Pupils equal round and reactive to light; sclera anicteric; extraocular muscles intact;  Nose without nasal septal hypertrophy Mouth/Parynx benign; Mallinpatti scale 3  Neck: No JVD, no carotid bruits; normal carotid upstroke Lungs: clear to ausculatation and percussion; no wheezing or rales Chest wall: without tenderness to palpitation; scoliosis Heart: PMI not displaced, RRR, s1 s2 normal, 1/6 systolic murmur, no diastolic murmur, no rubs, gallops, thrills, or heaves Abdomen: soft, nontender; no hepatosplenomehaly, BS+; abdominal aorta nontender and not dilated by palpation. Back: no CVA tenderness; scoliosis Pulses 2+ Musculoskeletal: full range of motion, normal strength, no joint deformities Extremities: no clubbing cyanosis or edema, Homan's sign negative  Neurologic: grossly nonfocal; Cranial nerves grossly wnl Psychologic: Normal mood and affect   ECG (independently read by me): Normal sinus rhythm at 64 bpm.  Normal intervals.  No ectopy.  September 2018 ECG (independently read by me): Normal sinus rhythm at 64 bpm.  Isolated PAC.  QTc interval 414 ms.  QS V1 V2, unchanged.  May 2018 ECG (independently read by me): Normal sinus rhythm at 65 bpm.  QS V1, V2.  Normal intervals.  No ST segment changes.  March 2018 ECG (independently read by me): Normal sinus rhythm at 65 bpm.  No ectopy.  Normal intervals.  September 2017 ECG (independently read by me): Normal sinus rhythm at 62 bpm.  QS V1 and V2.  Normal intervals.  April 2017 ECG (independently read by me): Normal sinus rhythm at 70 bpm.  No ectopy.  PR interval 158 ms and QTc interval 414 ms.  March 2017 ECG (independently read by me): normal sinus rhythm at 61 bpm..  No ST segment changes.  Normal intervals.  QTc  interval 396 ms.  August 2014 ECG (independently read by me): Normal sinus rhythm at 63 bpm.  QRS couplets V1 V2.  No cigarette ST-T changes.  May 2016 ECG (independently read by me): Sinus bradycardia 59 bpm.  No ectopy.  February 2016 ECG (independently read by me): Sinus bradycardia 56 bpm.  Normal intervals.  Prior ECG (independently read by me): Normal sinus rhythm at 62 beats per minute.  QTc interval 411 milliseconds.  QRS complex V1, V2.  Normal intervals.  ECG (independently read by me): Normal sinus rhythm at 62 beats per minute. Normal intervals. QTc interval 399 ms.  Prior ECG of 07/13/2013: Sinus rhythm at 61 beats per minute. QTc interval 406 ms. PR interval normal at 168 ms.  LABS: BMP Latest Ref Rng & Units 12/05/2017 07/02/2017 07/01/2017  Glucose 65 - 99 mg/dL 129(H) 88 102(H)  BUN 6 - 20 mg/dL 13 9 13   Creatinine 0.44 - 1.00 mg/dL 0.87 0.95 0.82  Sodium 135 - 145 mmol/L 136 139 134(L)  Potassium 3.5 - 5.1 mmol/L 3.8 3.8 3.6  Chloride 101 - 111 mmol/L 100(L) 109 101  CO2 22 - 32 mmol/L 26 24 24   Calcium 8.9 - 10.3 mg/dL 9.6 8.7(L) 9.3   Hepatic Function Latest Ref Rng & Units 12/05/2017 07/01/2017 06/30/2017  Total Protein 6.5 - 8.1 g/dL 7.6 7.2 8.2(H)  Albumin 3.5 - 5.0 g/dL 4.4 4.4 5.0  AST 15 - 41 U/L 30 28 40  ALT 14 - 54 U/L 18 16 17   Alk Phosphatase 38 - 126 U/L 55 40 46  Total Bilirubin 0.3 - 1.2 mg/dL 0.6 1.3(H) 1.4(H)  Bilirubin, Direct 0.1 - 0.5 mg/dL - 0.1 -   CBC Latest Ref Rng & Units 12/05/2017 07/02/2017 07/01/2017  WBC 4.0 - 10.5 K/uL 9.2 10.1 8.3  Hemoglobin 12.0 - 15.0 g/dL 12.0 11.2(L) 14.1  Hematocrit 36.0 - 46.0 % 36.0 32.9(L) 40.8  Platelets 150 - 400 K/uL 288 224  272   Lab Results  Component Value Date   TSH 1.07 12/03/2016  Lipid Panel     Component Value Date/Time   CHOL 154 12/03/2016 0845   TRIG 81 12/03/2016 0845   HDL 67 12/03/2016 0845   CHOLHDL 2.3 12/03/2016 0845   VLDL 16 12/03/2016 0845   LDLCALC 71 12/03/2016 0845      RADIOLOGY: No results found.  IMPRESSION:  1. Essential hypertension, benign   2. OSA (obstructive sleep apnea)   3. Pulmonary hypertension, unspecified ([place])   4. Other idiopathic scoliosis, thoracolumbar region   5. Mild Hair loss   6. Atherosclerosis of both carotid arteries   7. Other idiopathic scoliosis, cervicothoracic region     ASSESSMENT AND PLAN: Ms. Bocek is an 84 year old female who has a history of documented SVT and PACs/PVCs which have been controlled with beta blocker therapy. She has documented normal systolic function on her echo in March 2014 with grade 1 diastolic dysfunction and had  significant left atrial dilatation and mild dilatation of the right ventricle with moderate dilatation of the right atrium. She has mild/moderate pulmonary hypertension with PA pressures of 42 mm. Her palpitations have resolved on  Toprol-XL 75 mg twice a day.  She was found to have obstructive sleep apnea but could not tolerate CPAP therapy.  Ultimately used an old mouthguard and a new chinstrap.  She previously had a customized oral appliance by Dr. Oneal Grout but due to discomfort had stopped using this.  She states her teeth that shifted.  Since she has been on her current dose of metoprolol succinate 75 mg twice a day, she has felt well with reference to palpitations.  Although I discussed with her a small rare likelihood that beta-blocker therapy can contribute to some hair loss, she is 84 years old and I have recommended she not discontinue beta-blocker therapy as this incidence is less than 1%.  However I do feel she would be more bothered if she had recurrent SVT or significant cardiac arrhythmia.  I have suggested a trial of over-the-counter Prevagen to see if this may improve some of her memory loss.  I also have recommended consideration for neurologic evaluation.  I have suggested that she see Dr. Oneal Grout back for reevaluation since she is not sleeping well and her posture is  affected by her scoliosis.  On previous carotid study she was noted to have mild carotid plaque.  She has a 2/6 MR murmur at her apex on auscultation and on her most recent echo Doppler study in October 2018 EF was 55 to 60% with grade 1 diastolic dysfunction, mitral annular calcification with mild MR.  Pulmonary pressure peak was at 34 mm.  I will see her in 6 months for reevaluation.  Time spent: 25 minutes Troy Sine, MD, [facility]  03/20/2018 11:56 AM

## 2018-03-18 NOTE — Patient Instructions (Signed)
Medication Instructions: Your physician recommends that you continue on your current medications as directed. Please refer to the Current Medication list given to you today.  If you need a refill on your cardiac medications before your next appointment, please call your pharmacy.    Follow-Up: Your physician wants you to follow-up in 6 months with Dr. [name]. You will receive a reminder letter in the mail two months in advance. If you don't receive a letter, please call our office at [phone] to schedule this follow-up appointment.   Thank you for choosing [facility]!!

## 2018-03-19 ENCOUNTER — Telehealth: Payer: Self-pay | Admitting: Cardiovascular Disease

## 2018-03-19 NOTE — Telephone Encounter (Signed)
Spoke with pt who states she was seen yesterday by Dr. [name] and forgot to ask him a few questions. She wanted to now if her mitral valve leak was related to her severe scoliosis since the leak was on the side it is more prominent on. She also wanted to know if she is able to take omega 3 gummy vitamins with her current medications. Routing to MF

## 2018-03-19 NOTE — Telephone Encounter (Signed)
New Message:     Pt said she saw Dr [name] yesterday, she have a few questions that she needs to ask please.

## 2018-03-20 ENCOUNTER — Encounter: Payer: Self-pay | Admitting: Cardiovascular Disease

## 2018-03-21 NOTE — Telephone Encounter (Addendum)
Her mild mitral regurgitation is not related to her scoliosis.  She has mild mitral annular calcification which is the etiology.  Okay to take gummy vitamins

## 2018-03-24 DIAGNOSIS — R109 Unspecified abdominal pain: Secondary | ICD-10-CM | POA: Diagnosis not present

## 2018-03-26 NOTE — Telephone Encounter (Signed)
Attempt to return call-continues to ring without answer or going to VM

## 2018-03-29 ENCOUNTER — Ambulatory Visit: Payer: Medicare Other | Attending: Orthopedic Surgery

## 2018-03-29 DIAGNOSIS — M62830 Muscle spasm of back: Secondary | ICD-10-CM

## 2018-03-29 DIAGNOSIS — R293 Abnormal posture: Secondary | ICD-10-CM | POA: Insufficient documentation

## 2018-03-29 DIAGNOSIS — M542 Cervicalgia: Secondary | ICD-10-CM | POA: Diagnosis not present

## 2018-03-29 DIAGNOSIS — M4125 Other idiopathic scoliosis, thoracolumbar region: Secondary | ICD-10-CM | POA: Diagnosis not present

## 2018-03-29 DIAGNOSIS — G8929 Other chronic pain: Secondary | ICD-10-CM | POA: Diagnosis not present

## 2018-03-29 DIAGNOSIS — M545 Low back pain: Secondary | ICD-10-CM | POA: Insufficient documentation

## 2018-03-29 DIAGNOSIS — M6281 Muscle weakness (generalized): Secondary | ICD-10-CM

## 2018-03-29 NOTE — Therapy (Signed)
[facility] [address], [place], [place], [address] Phone: [phone]   Fax:  [phone]  Physical Therapy Evaluation  Patient Details  Name: Janet Nguyen MRN: [id number] Date of Birth: [birth year] Referring Provider: Karenann Cai, [facility]   Encounter Date: 03/29/2018  PT End of Session - 03/29/18 1657    Visit Number  1    Date for PT Re-Evaluation  06/21/18    Authorization Type  Medicare BCBS    PT Start Time  1616    PT Stop Time  1656    PT Time Calculation (min)  40 min    Activity Tolerance  Patient tolerated treatment well    Behavior During Therapy  [facility] for tasks assessed/performed       Past Medical History:  Diagnosis Date  . Arrhythmia    History of SVT with documented PVC'S and  PAC'S  12/08/12 Nuc stress test normal LV EF 74%  Event Monitor  12/01/12-01/03/13  . Celiac disease    treated by Dr. [name]  . GERD (gastroesophageal reflux disease)   . Intervertebral disc stenosis of neural canal of cervical region   . Irregular heart beat 11/30/12   ECHO-EF 60-65%  . Osteoporosis   . Scoliosis   . Scoliosis   . Sleep apnea 10/02/11 [facility]   Sleep study AHI -total sleep 10.3/hr  64.0/ hr during REM sleep.RDI 22.8/hr during total sleep 64.0/hr during REM sleep The lowest O2 sat during Non-REM and REM sleep was 86% and 88% respectively. 04/08/12 CPAP/BIPAP titration study [facility]    Past Surgical History:  Procedure Laterality Date  . APPENDECTOMY     ruptured at age 89 and had surgery  . CARDIAC CATHETERIZATION  01/27/06    There were no vitals filed for this visit.   Subjective Assessment - 03/29/18 1619    Subjective  Pt presents to PT with chronic back pain, neck pain postural abnormalities and scoliosis.  Pt reports that she has a new "bulge" in her abdomen has presented recently and GI doctor assessed and determined it was soft tissue.     Pertinent History  severe scoliosis, GI issues    Limitations  Sitting    How long can you sit comfortably?  20-30 minutes max-very uncomfortable    How long can you walk comfortably?  no limits    Diagnostic tests  none recent    Patient Stated Goals  reduce pain, improve posture    Currently in Pain?  Yes    Pain Score  5     Pain Location  Back neck and thoracic spine- widespread    Pain Orientation  Right;Left;Lower;Mid;Upper    Pain Descriptors / Indicators  Aching;Tightness;Sore    Pain Type  Chronic pain    Pain Onset  More than a month ago    Pain Frequency  Constant    Aggravating Factors   constant, sitting    Pain Relieving Factors  walking, stretching         [facility] PT Assessment - 03/29/18 0001      Assessment   Medical Diagnosis  chronic pain syndrome    Referring Provider  Karenann Cai, [facility]    Onset Date/Surgical Date  03/29/13 chronic    Prior Therapy  1 year ago      Precautions   Precautions  None      Restrictions   Weight Bearing Restrictions  No  Balance Screen   Has the patient fallen in the past 6 months  No    Has the patient had a decrease in activity level because of a fear of falling?   No    Is the patient reluctant to leave their home because of a fear of falling?   No      Home Environment   Living Environment  Private residence    Living Arrangements  Alone    Type of [place]      Prior Function   Level of [place]  Retired      Associate Professor   Overall Cognitive Status  Within Functional Limits for tasks assessed      Observation/Other Assessments   Focus on Therapeutic Outcomes (FOTO)   -- not able to test due to time contraints      Posture/Postural Control   Posture/Postural Control  Postural limitations    Postural Limitations  Increased thoracic kyphosis;Flexed trunk;Weight shift left      ROM / Strength   AROM / PROM / Strength  AROM;Strength      AROM   Overall AROM   Deficits     Overall AROM Comments  cervical A/ROM is limited by 50% in all directions with pain, lumbar A/ROM: flexion is full, extension limited by 75%, Lt sidebending is full, Rt limited by 50%      Strength   Overall Strength  Unable to assess      Palpation   Spinal mobility  reduced spinal mobility due to scoliosis and chronic postural limitations    Palpation comment  tension and trigger points with widespread pain in bil neck, thoracic spine and lumbar spine.  Bulge of Rt abdomen- soft to palpation      Ambulation/Gait   Ambulation/Gait  Yes    Ambulation/Gait Assistance  7: Independent                Objective measurements completed on examination: See above findings.              PT Education - 03/29/18 1711    Education Details  [id number]    Person(s) Educated  Patient    Methods  Demonstration;Explanation;Handout    Comprehension  Verbalized understanding;Returned demonstration       PT Short Term Goals - 03/29/18 1711      PT SHORT TERM GOAL #1   Title  independent with postural correction exercises to elongate her trunk and separate rib cage from pelvis    Time  4    Period  Weeks    Status  New    Target Date  05/10/18      PT SHORT TERM GOAL #2   Title  report a 20% reduction in LBP and neck pain with sitting    Time  6    Period  Weeks    Status  New    Target Date  05/10/18      PT SHORT TERM GOAL #3   Title  --      PT SHORT TERM GOAL #4   Title  --        PT Long Term Goals - 03/29/18 1713      PT LONG TERM GOAL #1   Title  independent with postural strengthening exercises    Time  8    Period  Weeks    Status  On-going    Target Date  06/21/18  PT LONG TERM GOAL #2   Title  report a 40% reduction in neck and low back pain with sitting    Time  12    Period  Weeks    Status  New    Target Date  06/21/18      PT LONG TERM GOAL #3   Title  back and cervical pain decreased to minimal during daily activities due to improve  postural muscle strength    Time  12    Period  Weeks    Status  New    Target Date  06/21/18      PT LONG TERM GOAL #4   Title  --             Plan - 03/29/18 1702    Clinical Impression Statement  Pt presents to PT with chronic and widespread pain.  Pt has significant scoliotic curvature with Rt thoracic convexity and Lt lumbar convexity.  Pt with abdominal bulge on the Lt that has been assessed by MD.  Pt reports that she feels like she had done a lot of exercise to address the scoliosis and has not been successful lately and had to stop a lot due to pain and inability to get on the floor.  Pt demonstrates limitations in cervical A/ROM and lumbar A/ROM with pain.  Postural weakness and core weakness with standing activities.  Pt with diffuse palpable tenderness over bil neck, Rt>Lt thoracic paraspinals and bil lumbar paraspinals.  Pt will benefit from skilled PT for 1x/wk advancement and modification of HEP for postural strength and flexibility and manual/modalities as needed for pain.      History and Personal Factors relevant to plan of care:  scoliosis, GI disorders, chronic pain    Clinical Presentation  Evolving    Clinical Presentation due to:  complex spinal curvature, pain    Clinical Decision Making  Moderate    Rehab Potential  Good    PT Frequency  1x / week    PT Duration  12 weeks    PT Treatment/Interventions  ADLs/Self Care Home Management;Moist Heat;Electrical Stimulation;Cryotherapy;Ultrasound;Therapeutic exercise;Therapeutic activities;Neuromuscular re-education;Patient/family education;Passive range of motion;Manual techniques;Taping    PT Next Visit Plan  Kinesiotape for pain, decompression exercises is supine is tolerated, review HEP and build HEP for independence at home    PT [place] access code    Consulted and Agree with Plan of Care  Patient       Patient will benefit from skilled therapeutic intervention in order to improve the  following deficits and impairments:  Improper body mechanics, Pain, Decreased mobility, Postural dysfunction, Decreased activity tolerance, Decreased range of motion, Decreased strength, Decreased balance, Difficulty walking, Impaired flexibility  Visit Diagnosis: Muscle weakness (generalized) - Plan: PT plan of care cert/re-cert  Abnormal posture - Plan: PT plan of care cert/re-cert  Other idiopathic scoliosis, thoracolumbar region - Plan: PT plan of care cert/re-cert  Cervicalgia - Plan: PT plan of care cert/re-cert  Muscle spasm of back - Plan: PT plan of care cert/re-cert  Chronic bilateral low back pain without sciatica - Plan: PT plan of care cert/re-cert     Problem List Patient Active Problem List   Diagnosis Date Noted  . Abdominal pain 07/01/2017  . Diverticulitis, colon   . Metabolic acidosis, increased anion gap   . Fatigue 12/23/2015  . Sciatica of right side 10/06/2015  . History of migraine headaches 10/06/2015  . Frequent PVCs 12/28/2013  . Premature atrial contractions 12/28/2013  .  PSVT (paroxysmal supraventricular tachycardia) ([place]) 12/28/2013  . Heart palpitations 07/13/2013  . Sleep apnea 04/11/2013  . Scoliosis 04/11/2013  . Atrial flutter with rapid ventricular response ([place]) 11/29/2012  . Chest pain, atypical 11/29/2012  . Fibromyalgia syndrome 11/29/2012  . Chronic steroid use 11/29/2012     Sigurd Sos, PT 03/29/18 5:16 PM  [facility] [address] [place], [place], [address] Phone: [phone]   Fax:  [phone]  Name: Janet Nguyen MRN: [id number] Date of Birth: September 23, 1932

## 2018-03-29 NOTE — Patient Instructions (Addendum)
Lower abdominal/core stability exercises  1. Practice your breathing technique: Inhale through your nose expanding your belly and rib cage. Try not to breathe into your chest. Exhale slowly and gradually out your mouth feeling a sense of softness to your body. Practice multiple times. This can be performed unlimited.  2. Finding the lower abdominals. Laying on your back with the knees bent, place your fingers just below your belly button. Using your breathing technique from above, on your exhale gently pull the belly button away from your fingertips without tensing any other muscles. Practice this 5x. Next, as you exhale, draw belly button inwards and hold onto it...then feel as if you are pulling that muscle across your pelvis like you are tightening a belt. This can be hard to do at first so be patient and practice. Do 5-10 reps 1-3 x day. Always recognize quality over quantity; if your abdominal muscles become tired you will notice you may tighten/contract other muscles. This is the time to take a break.   Practice this first laying on your back, then in sitting, progressing to standing and finally adding it to all your daily movements.   Decompression Exercise: Basic   Lie on back on firm surface, knees bent, feet flat, arms turned up, out to sides, backs of hands down. Time 3-5___ minutes. Surface: floor   Copyright  VHI. All rights reserved.  Shoulder Press   Press both shoulders down. Hold _3-5__ seconds. Repeat _5-10__ times. Surface: floor    Access Code: [email or web address]  URL: [email or web address]  Date: 03/29/2018  Prepared by: Sigurd Sos   Exercises  Seated Cervical Flexion AROM - 10 reps - 3 sets - 1x daily - 7x weekly  Seated Cervical Sidebending AROM - 10 reps - 3 sets - 1x daily - 7x weekly  Seated Cervical Rotation AROM - 10 reps - 3 sets - 1x daily - 7x weekly  Seated Correct Posture - 10 reps - 3 sets - 1x daily - 7x weekly  Standing Hip Abduction - 10  reps - 2 sets - 1 hold - 2x daily - 7x weekly  Standing Hip Extension - 10 reps - 2 sets - 2x daily - 7x weekly

## 2018-03-30 NOTE — Telephone Encounter (Signed)
Patient made aware and verbalized understanding.

## 2018-04-05 ENCOUNTER — Encounter

## 2018-04-12 ENCOUNTER — Ambulatory Visit: Payer: Medicare Other

## 2018-04-12 DIAGNOSIS — M542 Cervicalgia: Secondary | ICD-10-CM | POA: Diagnosis not present

## 2018-04-12 DIAGNOSIS — M4125 Other idiopathic scoliosis, thoracolumbar region: Secondary | ICD-10-CM

## 2018-04-12 DIAGNOSIS — R293 Abnormal posture: Secondary | ICD-10-CM

## 2018-04-12 DIAGNOSIS — M62830 Muscle spasm of back: Secondary | ICD-10-CM

## 2018-04-12 DIAGNOSIS — M545 Low back pain: Secondary | ICD-10-CM | POA: Diagnosis not present

## 2018-04-12 DIAGNOSIS — M6281 Muscle weakness (generalized): Secondary | ICD-10-CM

## 2018-04-12 NOTE — Therapy (Signed)
[facility] [address], [place], [place], [address] Phone: [phone]   Fax:  [phone]  Physical Therapy Treatment  Patient Details  Name: Janet Nguyen MRN: [id number] Date of Birth: [birth year] Referring Provider: Karenann Cai, [facility]   Encounter Date: 04/12/2018  PT End of Session - 04/12/18 1531    Visit Number  2    Date for PT Re-Evaluation  06/21/18    Authorization Type  Medicare BCBS    PT Start Time  1406    PT Stop Time  1459    PT Time Calculation (min)  53 min    Activity Tolerance  Patient tolerated treatment well    Behavior During Therapy  [facility] for tasks assessed/performed       Past Medical History:  Diagnosis Date  . Arrhythmia    History of SVT with documented PVC'S and  PAC'S  12/08/12 Nuc stress test normal LV EF 74%  Event Monitor  12/01/12-01/03/13  . Celiac disease    treated by Dr. [name]  . GERD (gastroesophageal reflux disease)   . Intervertebral disc stenosis of neural canal of cervical region   . Irregular heart beat 11/30/12   ECHO-EF 60-65%  . Osteoporosis   . Scoliosis   . Scoliosis   . Sleep apnea 10/02/11 [facility]   Sleep study AHI -total sleep 10.3/hr  64.0/ hr during REM sleep.RDI 22.8/hr during total sleep 64.0/hr during REM sleep The lowest O2 sat during Non-REM and REM sleep was 86% and 88% respectively. 04/08/12 [facility]BIPAP [facility]ration [facility]    Past Surgical History:  Procedure Laterality Date  . APPENDECTOMY     ruptured at age 67 and had surgery  . CARDIAC CATHETERIZATION  01/27/06    There were no vitals filed for this visit.  Subjective Assessment - 04/12/18 1408    Subjective  I am fatigued right now.  It is more than my back right now.      Currently in Pain?  Yes    Pain Score  5     Pain Location  Back    Pain Orientation  Right;Left;Lower;Mid;Upper    Pain Descriptors / Indicators  Aching;Tightness     Pain Type  Chronic pain    Pain Onset  More than a month ago    Pain Frequency  Constant                       [facility] Adult PT Treatment/Exercise - 04/12/18 0001      Exercises   Exercises  Knee/Hip;Lumbar;Neck      Neck Exercises: Standing   Other Standing Exercises  shoulder extension, rows and ER with yellow band: 2x10 bil  tactile and demo cues for technique      Modalities   Modalities  Moist Heat      Moist Heat Therapy   Number Minutes Moist Heat  12 Minutes    Moist Heat Location  Cervical      Manual Therapy   Manual Therapy  Joint mobilization;Soft tissue mobilization    Manual therapy comments  soft tissue elongation to bil upper traps and bil cervical paraspinals with pt propped on wedge               PT Short Term Goals - 03/29/18 1711      PT SHORT TERM GOAL #1   Title  independent with postural correction exercises to elongate her  trunk and separate rib cage from pelvis    Time  4    Period  Weeks    Status  New    Target Date  05/10/18      PT SHORT TERM GOAL #2   Title  report a 20% reduction in LBP and neck pain with sitting    Time  6    Period  Weeks    Status  New    Target Date  05/10/18      PT SHORT TERM GOAL #3   Title  --      PT SHORT TERM GOAL #4   Title  --        PT Long Term Goals - 03/29/18 1713      PT LONG TERM GOAL #1   Title  independent with postural strengthening exercises    Time  8    Period  Weeks    Status  On-going    Target Date  06/21/18      PT LONG TERM GOAL #2   Title  report a 40% reduction in neck and low back pain with sitting    Time  12    Period  Weeks    Status  New    Target Date  06/21/18      PT LONG TERM GOAL #3   Title  back and cervical pain decreased to minimal during daily activities due to improve postural muscle strength    Time  12    Period  Weeks    Status  New    Target Date  06/21/18      PT LONG TERM GOAL #4   Title  --            Plan -  04/12/18 1444    Clinical Impression Statement  Pt with 1 session after evaluation.  Pt with chronic pain and significant scoliotic curvature.  PT advanced HEP to include standing neck/postural strength exercises to include core activation.  Tactile and demo cues were required for posture during standing exercise. Pt with significant tension and trigger points in bil upper traps, suboccipicals and paraspinals.  Pt will attend 1x/wk to address strength and flexibility progression and pain.      Rehab Potential  Good    PT Frequency  1x / week    PT Duration  12 weeks    PT Treatment/Interventions  ADLs/Self Care Home Management;Moist Heat;Electrical Stimulation;Cryotherapy;Ultrasound;Therapeutic exercise;Therapeutic activities;Neuromuscular re-education;Patient/family education;Passive range of motion;Manual techniques;Taping    PT Next Visit Plan  Kinesiotape for pain, decompression exercises is supine is tolerated, review HEP and build HEP for independence at home    PT [place] access code    Recommended Other Services  initial certification is signed    Consulted and Agree with Plan of Care  Patient       Patient will benefit from skilled therapeutic intervention in order to improve the following deficits and impairments:  Improper body mechanics, Pain, Decreased mobility, Postural dysfunction, Decreased activity tolerance, Decreased range of motion, Decreased strength, Decreased balance, Difficulty walking, Impaired flexibility  Visit Diagnosis: Muscle weakness (generalized)  Abnormal posture  Other idiopathic scoliosis, thoracolumbar region  Cervicalgia  Muscle spasm of back     Problem List Patient Active Problem List   Diagnosis Date Noted  . Abdominal pain 07/01/2017  . Diverticulitis, colon   . Metabolic acidosis, increased anion gap   . Fatigue 12/23/2015  . Sciatica of right side 10/06/2015  .  History of migraine headaches 10/06/2015  . Frequent  PVCs 12/28/2013  . Premature atrial contractions 12/28/2013  . PSVT (paroxysmal supraventricular tachycardia) ([place]) 12/28/2013  . Heart palpitations 07/13/2013  . Sleep apnea 04/11/2013  . Scoliosis 04/11/2013  . Atrial flutter with rapid ventricular response ([place]) 11/29/2012  . Chest pain, atypical 11/29/2012  . Fibromyalgia syndrome 11/29/2012  . Chronic steroid use 11/29/2012    Janet Nguyen, PT 04/12/18 3:34 PM  [facility] [address], [place], [place], [address] Phone: [phone]   Fax:  [phone]  Name: ANGELIAH WISDOM MRN: [id number] Date of Birth: 1932-07-28

## 2018-04-14 ENCOUNTER — Ambulatory Visit: Payer: Medicare Other | Admitting: Cardiovascular Disease

## 2018-04-19 ENCOUNTER — Ambulatory Visit: Payer: Medicare Other

## 2018-04-19 DIAGNOSIS — R293 Abnormal posture: Secondary | ICD-10-CM | POA: Diagnosis not present

## 2018-04-19 DIAGNOSIS — M6281 Muscle weakness (generalized): Secondary | ICD-10-CM

## 2018-04-19 DIAGNOSIS — M4125 Other idiopathic scoliosis, thoracolumbar region: Secondary | ICD-10-CM

## 2018-04-19 DIAGNOSIS — M542 Cervicalgia: Secondary | ICD-10-CM

## 2018-04-19 DIAGNOSIS — M62830 Muscle spasm of back: Secondary | ICD-10-CM | POA: Diagnosis not present

## 2018-04-19 DIAGNOSIS — M545 Low back pain: Secondary | ICD-10-CM | POA: Diagnosis not present

## 2018-04-19 NOTE — Patient Instructions (Signed)
Lower abdominal/core stability exercises  1. Practice your breathing technique: Inhale through your nose expanding your belly and rib cage. Try not to breathe into your chest. Exhale slowly and gradually out your mouth feeling a sense of softness to your body. Practice multiple times. This can be performed unlimited.  2. Finding the lower abdominals. Laying on your back with the knees bent, place your fingers just below your belly button. Using your breathing technique from above, on your exhale gently pull the belly button away from your fingertips without tensing any other muscles. Practice this 5x. Next, as you exhale, draw belly button inwards and hold onto it...then feel as if you are pulling that muscle across your pelvis like you are tightening a belt. This can be hard to do at first so be patient and practice. Do 5-10 reps 1-3 x day. Always recognize quality over quantity; if your abdominal muscles become tired you will notice you may tighten/contract other muscles. This is the time to take a break.   Practice this first laying on your back, then in sitting, progressing to standing and finally adding it to all your daily movements.   3. Finding your pelvic floor. Using the breathing technique above, when your exhale, this time draw your pelvic floor muscles up as if you were attempting to stop the flow of urination. Be careful NOT to tense any other muscles. This can be hard, BE PATIENT. Try to hold up to 10 seconds repeating 10x. Try 2x a day. Once you feel you are doing this well, add this contraction to exercise #2. First contracting your pelvic floor followed by lower abdominals.   4. Adding leg movements. Add the following leg movements to challenge your ability to keep your core stable:  1. Single leg drop outs: Laying on your back with knees bent feet flat. Inhale,  dropping one knee outward KEEPING YOUR PELVIS STILL. Exhale as you bring the leg back, simultaneously performing your lower  abdominal contraction. Do 5-10 on each leg.     3. Single leg slides: Inhale while you slowly slide one leg out keeping your pelvis still. Only slide your leg as far as you can keep your pelvis still. Exhale as you bring the leg back to the start, contracting the lower abdominals as you do that. Keep your upper body relaxed. Do 5-10 on each side.        Supine theraband exercises [email or web address]  Access code

## 2018-04-19 NOTE — Therapy (Signed)
[facility] [address] [place], [place], [address] Phone: [phone]   Fax:  [phone]  Physical Therapy Treatment  Patient Details  Name: Janet Nguyen MRN: [id number] Date of Birth: 1932-08-28 Referring Provider: Karenann Cai, [facility]   Encounter Date: 04/19/2018  PT End of Session - 04/19/18 1213    Visit Number  3    Date for PT Re-Evaluation  06/21/18    Authorization Type  Medicare BCBS    PT Start Time  1103    PT Stop Time  1152 pt with slow mobility    PT Time Calculation (min)  49 min    Activity Tolerance  Patient tolerated treatment well    Behavior During Therapy  [facility] for tasks assessed/performed       Past Medical History:  Diagnosis Date  . Arrhythmia    History of SVT with documented PVC'S and  PAC'S  12/08/12 Nuc stress test normal LV EF 74%  Event Monitor  12/01/12-01/03/13  . Celiac disease    treated by Dr. [name]  . GERD (gastroesophageal reflux disease)   . Intervertebral disc stenosis of neural canal of cervical region   . Irregular heart beat 11/30/12   ECHO-EF 60-65%  . Osteoporosis   . Scoliosis   . Scoliosis   . Sleep apnea 10/02/11 [facility]   Sleep study AHI -total sleep 10.3/hr  64.0/ hr during REM sleep.RDI 22.8/hr during total sleep 64.0/hr during REM sleep The lowest O2 sat during Non-REM and REM sleep was 86% and 88% respectively. 04/08/12 CPAP[facility]BIPAP titration study  [facility]    Past Surgical History:  Procedure Laterality Date  . APPENDECTOMY     ruptured at age 49 and had surgery  . CARDIAC CATHETERIZATION  01/27/06    There were no vitals filed for this visit.  Subjective Assessment - 04/19/18 1101    Subjective  Still fatigued.  I am concentrating more on doing my homework.      Pertinent History  severe scoliosis, GI issues    Patient Stated Goals  reduce pain, improve posture    Currently in Pain?  Yes    Pain Score   5     Pain Location  Back    Pain Orientation  Right;Left;Lower    Pain Descriptors / Indicators  Aching;Tightness    Pain Type  Chronic pain    Pain Radiating Towards  neck    Pain Onset  More than a month ago    Pain Frequency  Constant    Aggravating Factors   constant pain, sitting, wearing a bra    Pain Relieving Factors  walking, stretching                       [facility] Adult PT Treatment/Exercise - 04/19/18 0001      Neck Exercises: Supine   Other Supine Exercise  supine in semi reclined with wedge, pillow and towel roll for neck.  Yellow theraband: horizontal abduction, D2 and ER 2x20 seconds      Lumbar Exercises: Supine   Other Supine Lumbar Exercises  TA activation and series with activation      Modalities   Modalities  Moist Heat      Moist Heat Therapy   Number Minutes Moist Heat  12 Minutes    Moist Heat Location  Cervical      Manual Therapy   Manual Therapy  Joint mobilization;Soft tissue  mobilization    Manual therapy comments  soft tissue elongation to bil upper traps and bil cervical paraspinals with pt propped on wedge             PT Education - 04/19/18 1130    Education Details  [id number]   (Pended)     Person(s) Educated  Patient  (Pended)     Methods  Explanation;Demonstration;Handout  (Pended)     Comprehension  Verbalized understanding;Returned demonstration  Museum/gallery conservator)        PT Short Term Goals - 03/29/18 1711      PT SHORT TERM GOAL #1   Title  independent with postural correction exercises to elongate her trunk and separate rib cage from pelvis    Time  4    Period  Weeks    Status  New    Target Date  05/10/18      PT SHORT TERM GOAL #2   Title  report a 20% reduction in LBP and neck pain with sitting    Time  6    Period  Weeks    Status  New    Target Date  05/10/18      PT SHORT TERM GOAL #3   Title  --      PT SHORT TERM GOAL #4   Title  --        PT Long Term Goals - 03/29/18 1713      PT LONG  TERM GOAL #1   Title  independent with postural strengthening exercises    Time  8    Period  Weeks    Status  On-going    Target Date  06/21/18      PT LONG TERM GOAL #2   Title  report a 40% reduction in neck and low back pain with sitting    Time  12    Period  Weeks    Status  New    Target Date  06/21/18      PT LONG TERM GOAL #3   Title  back and cervical pain decreased to minimal during daily activities due to improve postural muscle strength    Time  12    Period  Weeks    Status  New    Target Date  06/21/18      PT LONG TERM GOAL #4   Title  --            Plan - 04/19/18 1116    Clinical Impression Statement  Pt is limited in positions that she is able to get into for exercise and manual therapy.  Pt with continued chronic pain associated with scoliosis and postural deficits.  Pt required verbal and tactile cues for core activation today.  Pt will attend 1x/wk for HEP progression and manual therapy as needed.      Rehab Potential  Good    PT Frequency  1x / week    PT Duration  12 weeks    PT Treatment/Interventions  ADLs/Self Care Home Management;Moist Heat;Electrical Stimulation;Cryotherapy;Ultrasound;Therapeutic exercise;Therapeutic activities;Neuromuscular re-education;Patient/family education;Passive range of motion;Manual techniques;Taping    PT Next Visit Plan  Kinesiotape for pain, decompression exercises is supine is tolerated, review HEP and build HEP for independence at home    PT [place] access code    Consulted and Agree with Plan of Care  Patient       Patient will benefit from skilled therapeutic intervention in order to improve the following deficits and  impairments:  Improper body mechanics, Pain, Decreased mobility, Postural dysfunction, Decreased activity tolerance, Decreased range of motion, Decreased strength, Decreased balance, Difficulty walking, Impaired flexibility  Visit Diagnosis: Muscle weakness  (generalized)  Abnormal posture  Other idiopathic scoliosis, thoracolumbar region  Cervicalgia  Muscle spasm of back     Problem List Patient Active Problem List   Diagnosis Date Noted  . Abdominal pain 07/01/2017  . Diverticulitis, colon   . Metabolic acidosis, increased anion gap   . Fatigue 12/23/2015  . Sciatica of right side 10/06/2015  . History of migraine headaches 10/06/2015  . Frequent PVCs 12/28/2013  . Premature atrial contractions 12/28/2013  . PSVT (paroxysmal supraventricular tachycardia) ([place]) 12/28/2013  . Heart palpitations 07/13/2013  . Sleep apnea 04/11/2013  . Scoliosis 04/11/2013  . Atrial flutter with rapid ventricular response ([place]) 11/29/2012  . Chest pain, atypical 11/29/2012  . Fibromyalgia syndrome 11/29/2012  . Chronic steroid use 11/29/2012    Sigurd Sos, PT 04/19/18 12:16 PM  [facility] [address] [place], [place], [address] Phone: [phone]   Fax:  [phone]  Name: Janet Nguyen MRN: [id number] Date of Birth: 10-Dec-1931

## 2018-04-20 DIAGNOSIS — L659 Nonscarring hair loss, unspecified: Secondary | ICD-10-CM | POA: Diagnosis not present

## 2018-04-20 DIAGNOSIS — R21 Rash and other nonspecific skin eruption: Secondary | ICD-10-CM | POA: Diagnosis not present

## 2018-04-20 DIAGNOSIS — I10 Essential (primary) hypertension: Secondary | ICD-10-CM | POA: Diagnosis not present

## 2018-04-20 DIAGNOSIS — Z0000 Encounter for general adult medical examination without abnormal findings: Secondary | ICD-10-CM | POA: Diagnosis not present

## 2018-04-20 DIAGNOSIS — M810 Age-related osteoporosis without current pathological fracture: Secondary | ICD-10-CM | POA: Diagnosis not present

## 2018-04-20 DIAGNOSIS — Z8739 Personal history of other diseases of the musculoskeletal system and connective tissue: Secondary | ICD-10-CM | POA: Diagnosis not present

## 2018-04-20 DIAGNOSIS — G479 Sleep disorder, unspecified: Secondary | ICD-10-CM | POA: Diagnosis not present

## 2018-04-20 DIAGNOSIS — R109 Unspecified abdominal pain: Secondary | ICD-10-CM | POA: Diagnosis not present

## 2018-04-20 DIAGNOSIS — M545 Low back pain: Secondary | ICD-10-CM | POA: Diagnosis not present

## 2018-04-20 DIAGNOSIS — R300 Dysuria: Secondary | ICD-10-CM | POA: Diagnosis not present

## 2018-04-20 DIAGNOSIS — I868 Varicose veins of other specified sites: Secondary | ICD-10-CM | POA: Diagnosis not present

## 2018-04-26 ENCOUNTER — Ambulatory Visit: Payer: Medicare Other

## 2018-04-26 DIAGNOSIS — M545 Low back pain: Secondary | ICD-10-CM

## 2018-04-26 DIAGNOSIS — M62830 Muscle spasm of back: Secondary | ICD-10-CM | POA: Diagnosis not present

## 2018-04-26 DIAGNOSIS — M6281 Muscle weakness (generalized): Secondary | ICD-10-CM

## 2018-04-26 DIAGNOSIS — M4125 Other idiopathic scoliosis, thoracolumbar region: Secondary | ICD-10-CM

## 2018-04-26 DIAGNOSIS — M542 Cervicalgia: Secondary | ICD-10-CM | POA: Diagnosis not present

## 2018-04-26 DIAGNOSIS — R293 Abnormal posture: Secondary | ICD-10-CM

## 2018-04-26 DIAGNOSIS — G8929 Other chronic pain: Secondary | ICD-10-CM

## 2018-04-26 NOTE — Therapy (Signed)
[facility] [address] [place], [place], [address] Phone: [phone]   Fax:  [phone]  Physical Therapy Treatment  Patient Details  Name: Janet Nguyen MRN: [id number] Date of Birth: [birth year] Referring Provider: Karenann Cai, [facility]   Encounter Date: 04/26/2018  PT End of Session - 04/26/18 1141    Visit Number  4    Date for PT Re-Evaluation  06/21/18    Authorization Type  Medicare BCBS    PT Start Time  1107 late    PT Stop Time  1155    PT Time Calculation (min)  48 min    Activity Tolerance  Patient tolerated treatment well    Behavior During Therapy  [facility] for tasks assessed/performed       Past Medical History:  Diagnosis Date  . Arrhythmia    History of SVT with documented PVC'S and  PAC'S  12/08/12 Nuc stress test normal LV EF 74%  Event Monitor  12/01/12-01/03/13  . Celiac disease    treated by Dr. [name]  . GERD (gastroesophageal reflux disease)   . Intervertebral disc stenosis of neural canal of cervical region   . Irregular heart beat 11/30/12   ECHO-EF 60-65%  . Osteoporosis   . Scoliosis   . Scoliosis   . Sleep apnea 10/02/11 [facility]   Sleep study AHI -total sleep 10.3/hr  64.0/ hr during REM sleep.RDI 22.8/hr during total sleep 64.0/hr during REM sleep The lowest O2 sat during Non-REM and REM sleep was 86% and 88% respectively. 04/08/12 CPAP/BIPAP titration study [facility]    Past Surgical History:  Procedure Laterality Date  . APPENDECTOMY     ruptured at age 36 and had surgery  . CARDIAC CATHETERIZATION  01/27/06    There were no vitals filed for this visit.  Subjective Assessment - 04/26/18 1107    Subjective  Still fatigued.  I am concentrating more on doing my homework.      Currently in Pain?  Yes    Pain Score  5     Pain Location  Back    Pain Orientation  Right;Lower;Left    Pain Descriptors / Indicators  Aching;Tightness    Pain Onset  More than a month ago    Pain Frequency  Constant    Aggravating Factors   constant pain, sitting, wearing a bra    Pain Relieving Factors  walking, stretching                       [facility] Adult PT Treatment/Exercise - 04/26/18 0001      Modalities   Modalities  Moist Heat      Moist Heat Therapy   Number Minutes Moist Heat  12 Minutes    Moist Heat Location  Cervical;Lumbar Spine      Manual Therapy   Manual Therapy  Joint mobilization;Soft tissue mobilization    Manual therapy comments  soft tissue elongation to bil upper traps and bil cervical paraspinals and lumbar paraspinals (sidelying) with pt propped on wedge               PT Short Term Goals - 03/29/18 1711      PT SHORT TERM GOAL #1   Title  independent with postural correction exercises to elongate her trunk and separate rib cage from pelvis    Time  4    Period  Weeks    Status  New  Target Date  05/10/18      PT SHORT TERM GOAL #2   Title  report a 20% reduction in LBP and neck pain with sitting    Time  6    Period  Weeks    Status  New    Target Date  05/10/18      PT SHORT TERM GOAL #3   Title  --      PT SHORT TERM GOAL #4   Title  --        PT Long Term Goals - 03/29/18 1713      PT LONG TERM GOAL #1   Title  independent with postural strengthening exercises    Time  8    Period  Weeks    Status  On-going    Target Date  06/21/18      PT LONG TERM GOAL #2   Title  report a 40% reduction in neck and low back pain with sitting    Time  12    Period  Weeks    Status  New    Target Date  06/21/18      PT LONG TERM GOAL #3   Title  back and cervical pain decreased to minimal during daily activities due to improve postural muscle strength    Time  12    Period  Weeks    Status  New    Target Date  06/21/18      PT LONG TERM GOAL #4   Title  --            Plan - 04/26/18 1111    Clinical Impression Statement  Pt has HEP in place for  strength and flexibility.  Pt gets most benefit from manual for elongation and trigger point release.  Pt will bring her HEP next session for review.  Pt has chronic pain and mobility difficulties due to scoliosis.    (Pended)     Rehab Potential  Good  (Pended)     PT Frequency  1x / week  (Pended)     PT Duration  12 weeks  (Pended)     PT Treatment/Interventions  ADLs/Self Care Home Management;Moist Heat;Electrical Stimulation;Cryotherapy;Ultrasound;Therapeutic exercise;Therapeutic activities;Neuromuscular re-education;Patient/family education;Passive range of motion;Manual techniques;Taping  (Pended)     PT Next Visit Plan  Kinesiotape for pain, decompression exercises is supine is tolerated, review HEP and build HEP for independence at home  (Pended)     [place] access code  (Pended)     Consulted and Agree with Plan of Care  Patient  (Pended)        Patient will benefit from skilled therapeutic intervention in order to improve the following deficits and impairments:  (P) Improper body mechanics, Pain, Decreased mobility, Postural dysfunction, Decreased activity tolerance, Decreased range of motion, Decreased strength, Decreased balance, Difficulty walking, Impaired flexibility  Visit Diagnosis: Muscle weakness (generalized)  Abnormal posture  Other idiopathic scoliosis, thoracolumbar region  Cervicalgia  Muscle spasm of back  Chronic bilateral low back pain without sciatica     Problem List Patient Active Problem List   Diagnosis Date Noted  . Abdominal pain 07/01/2017  . Diverticulitis, colon   . Metabolic acidosis, increased anion gap   . Fatigue 12/23/2015  . Sciatica of right side 10/06/2015  . History of migraine headaches 10/06/2015  . Frequent PVCs 12/28/2013  . Premature atrial contractions 12/28/2013  . PSVT (paroxysmal supraventricular tachycardia) ([place]) 12/28/2013  . Heart palpitations 07/13/2013  .  Sleep apnea 04/11/2013  .  Scoliosis 04/11/2013  . Atrial flutter with rapid ventricular response ([place]) 11/29/2012  . Chest pain, atypical 11/29/2012  . Fibromyalgia syndrome 11/29/2012  . Chronic steroid use 11/29/2012    Sigurd Sos, PT 04/26/18 11:43 AM  [facility] [address], [place], [place], [address] Phone: [phone]   Fax:  [phone]  Name: Janet Nguyen MRN: [id number] Date of Birth: 1931-10-24

## 2018-04-29 DIAGNOSIS — M419 Scoliosis, unspecified: Secondary | ICD-10-CM | POA: Diagnosis not present

## 2018-04-29 DIAGNOSIS — M47897 Other spondylosis, lumbosacral region: Secondary | ICD-10-CM | POA: Diagnosis not present

## 2018-04-29 DIAGNOSIS — G894 Chronic pain syndrome: Secondary | ICD-10-CM | POA: Diagnosis not present

## 2018-05-03 ENCOUNTER — Telehealth: Payer: Self-pay | Admitting: Cardiovascular Disease

## 2018-05-03 DIAGNOSIS — I6529 Occlusion and stenosis of unspecified carotid artery: Secondary | ICD-10-CM

## 2018-05-03 NOTE — Telephone Encounter (Signed)
Pt called to report that she is continuing to have several  optical migraines. Her eye doctor had recommended that she should talk with Dr. [name] about having a follow up carotid ultrasound last one done 3/18. I advised her that I will forward her request to Dr. [name] and will call her back if he would like to proceed with the test. Pt agrees.

## 2018-05-03 NOTE — Telephone Encounter (Signed)
Okay to schedule for carotid duplex imaging

## 2018-05-03 NOTE — Telephone Encounter (Signed)
New problem   Pt has been having optimal migraines and need to speak to nurse concerning this, and possible speak about having a test done. Please call pt

## 2018-05-04 ENCOUNTER — Telehealth: Payer: Self-pay | Admitting: Cardiovascular Disease

## 2018-05-04 NOTE — Telephone Encounter (Signed)
Called the patient and left a VM to call me back to schedule the carotid.

## 2018-05-04 NOTE — Telephone Encounter (Signed)
Carotid ordered, patient aware and verbalized understanding.   Message sent to scheduling

## 2018-05-07 ENCOUNTER — Ambulatory Visit (HOSPITAL_COMMUNITY)
Admission: RE | Admit: 2018-05-07 | Discharge: 2018-05-07 | Disposition: A | Discharge status: Home / Self Care | Payer: Medicare Other | Source: Ambulatory Visit | Attending: Cardiology | Admitting: Cardiology

## 2018-05-07 DIAGNOSIS — I6529 Occlusion and stenosis of unspecified carotid artery: Secondary | ICD-10-CM | POA: Diagnosis not present

## 2018-05-10 ENCOUNTER — Ambulatory Visit: Payer: Medicare Other | Attending: Orthopedic Surgery

## 2018-05-10 DIAGNOSIS — R293 Abnormal posture: Secondary | ICD-10-CM

## 2018-05-10 DIAGNOSIS — M4125 Other idiopathic scoliosis, thoracolumbar region: Secondary | ICD-10-CM | POA: Insufficient documentation

## 2018-05-10 DIAGNOSIS — M6281 Muscle weakness (generalized): Secondary | ICD-10-CM | POA: Diagnosis not present

## 2018-05-10 NOTE — Therapy (Signed)
[facility] [address], [place], [place], [address] Phone: [phone]   Fax:  [phone]  Physical Therapy Treatment  Patient Details  Name: Janet Nguyen MRN: [id number] Date of Birth: 1932-05-17 Referring Provider: Karenann Cai, [facility]   Encounter Date: 05/10/2018  PT End of Session - 05/10/18 1229    Visit Number  5    Date for PT Re-Evaluation  06/21/18    Authorization Type  Medicare BCBS    PT Start Time  1149    PT Stop Time  1227    PT Time Calculation (min)  38 min    Activity Tolerance  Patient tolerated treatment well    Behavior During Therapy  [facility] for tasks assessed/performed       Past Medical History:  Diagnosis Date  . Arrhythmia    History of SVT with documented PVC'S and  PAC'S  12/08/12 Nuc stress test normal LV EF 74%  Event Monitor  12/01/12-01/03/13  . Celiac disease    treated by Dr. [name]  . GERD (gastroesophageal reflux disease)   . Intervertebral disc stenosis of neural canal of cervical region   . Irregular heart beat 11/30/12   ECHO-EF 60-65%  . Osteoporosis   . Scoliosis   . Scoliosis   . Sleep apnea 10/02/11 [facility]   Sleep study AHI -total sleep 10.3/hr  64.0/ hr during REM sleep.RDI 22.8/hr during total sleep 64.0/hr during REM sleep The lowest O2 sat during Non-REM and REM sleep was 86% and 88% respectively. 04/08/12 CPAP/BIPAP titration study  [facility]    Past Surgical History:  Procedure Laterality Date  . APPENDECTOMY     ruptured at age 17 and had surgery  . CARDIAC CATHETERIZATION  01/27/06    There were no vitals filed for this visit.  Subjective Assessment - 05/10/18 1205    Subjective  I have a lot of exercises and I want to go through them today.      Currently in Pain?  Yes    Pain Score  5     Pain Location  Back    Pain Orientation  Right;Left;Lower    Pain Descriptors / Indicators  Aching;Tightness    Pain  Type  Chronic pain    Pain Onset  More than a month ago    Pain Frequency  Constant    Aggravating Factors   sitting, wearing a bra, constant pain    Pain Relieving Factors  walking, stretching                               PT Education - 05/10/18 1237    Education Details  used old HEP handouts and build a HEP    Person(s) Educated  Patient    Methods  Explanation;Demonstration;Handout    Comprehension  Verbalized understanding;Returned demonstration       PT Short Term Goals - 03/29/18 1711      PT SHORT TERM GOAL #1   Title  independent with postural correction exercises to elongate her trunk and separate rib cage from pelvis    Time  4    Period  Weeks    Status  New    Target Date  05/10/18      PT SHORT TERM GOAL #2   Title  report a 20% reduction in LBP and neck pain with sitting    Time  6  Period  Weeks    Status  New    Target Date  05/10/18      PT SHORT TERM GOAL #3   Title  --      PT SHORT TERM GOAL #4   Title  --        PT Long Term Goals - 03/29/18 1713      PT LONG TERM GOAL #1   Title  independent with postural strengthening exercises    Time  8    Period  Weeks    Status  On-going    Target Date  06/21/18      PT LONG TERM GOAL #2   Title  report a 40% reduction in neck and low back pain with sitting    Time  12    Period  Weeks    Status  New    Target Date  06/21/18      PT LONG TERM GOAL #3   Title  back and cervical pain decreased to minimal during daily activities due to improve postural muscle strength    Time  12    Period  Weeks    Status  New    Target Date  06/21/18      PT LONG TERM GOAL #4   Title  --            Plan - 05/10/18 1236    Clinical Impression Statement  Entire session spent reviewing old HEP and building a HEP for pt to complete independently.  We discussed chronic nature of condition and need for exercise to reduce further progression.  Pt will be placed on hold and will  return for manual therapy as needed before the end of her he plan of care.      PT Frequency  1x / week    PT Duration  12 weeks    PT Treatment/Interventions  ADLs/Self Care Home Management;Moist Heat;Electrical Stimulation;Cryotherapy;Ultrasound;Therapeutic exercise;Therapeutic activities;Neuromuscular re-education;Patient/family education;Passive range of motion;Manual techniques;Taping    PT Next Visit Plan  Hold until 06/21/18    PT Home Exercise Plan  [facility] access code    Consulted and Agree with Plan of Care  Patient       Patient will benefit from skilled therapeutic intervention in order to improve the following deficits and impairments:  Improper body mechanics, Pain, Decreased mobility, Postural dysfunction, Decreased activity tolerance, Decreased range of motion, Decreased strength, Decreased balance, Difficulty walking, Impaired flexibility  Visit Diagnosis: Muscle weakness (generalized)  Abnormal posture  Other idiopathic scoliosis, thoracolumbar region     Problem List Patient Active Problem List   Diagnosis Date Noted  . Abdominal pain 07/01/2017  . Diverticulitis, colon   . Metabolic acidosis, increased anion gap   . Fatigue 12/23/2015  . Sciatica of right side 10/06/2015  . History of migraine headaches 10/06/2015  . Frequent PVCs 12/28/2013  . Premature atrial contractions 12/28/2013  . PSVT (paroxysmal supraventricular tachycardia) ([place]) 12/28/2013  . Heart palpitations 07/13/2013  . Sleep apnea 04/11/2013  . Scoliosis 04/11/2013  . Atrial flutter with rapid ventricular response ([place]) 11/29/2012  . Chest pain, atypical 11/29/2012  . Fibromyalgia syndrome 11/29/2012  . Chronic steroid use 11/29/2012     Sigurd Sos, PT 05/10/18 12:37 PM  [facility] [address] [place], [place], [address] Phone: [phone]   Fax:  [phone]  Name: Janet Nguyen MRN: [id number] Date of  Birth: August 21, 1932

## 2018-05-11 DIAGNOSIS — N760 Acute vaginitis: Secondary | ICD-10-CM | POA: Diagnosis not present

## 2018-05-12 ENCOUNTER — Telehealth: Payer: Self-pay | Admitting: Cardiovascular Disease

## 2018-05-12 NOTE — Telephone Encounter (Signed)
New message    Patient is calling in to get carotid test results.  Please advise.

## 2018-05-12 NOTE — Telephone Encounter (Signed)
Called patient and advised of results.  Patient understood. Verbalized.

## 2018-05-26 ENCOUNTER — Telehealth: Payer: Self-pay | Admitting: *Deleted

## 2018-05-26 NOTE — Telephone Encounter (Signed)
Faxed oral appliance RX with June office note over to Dr Oneal Grout per his request.

## 2018-06-07 ENCOUNTER — Ambulatory Visit: Payer: Medicare Other | Attending: Orthopedic Surgery

## 2018-06-07 DIAGNOSIS — R293 Abnormal posture: Secondary | ICD-10-CM | POA: Insufficient documentation

## 2018-06-07 DIAGNOSIS — M4125 Other idiopathic scoliosis, thoracolumbar region: Secondary | ICD-10-CM | POA: Diagnosis not present

## 2018-06-07 DIAGNOSIS — M6281 Muscle weakness (generalized): Secondary | ICD-10-CM | POA: Diagnosis not present

## 2018-06-07 NOTE — Patient Instructions (Signed)
Access Code: [email or web address]  URL: [email or web address]  Date: 06/07/2018  Prepared by: Sigurd Sos   Exercises  Quadruped Thoracic Rotation - Reach Under - 10 reps - 1 sets - 2x daily - 7x weekly  Quadruped Thoracic Rotation with Hand on Neck - 10 reps - 1 sets - 1x daily - 7x weekly  Doorway Pec Stretch at 90 Degrees Abduction - 10 reps - 1x daily - 7x weekly  Doorway Pec Stretch at 120 Degrees Abduction - 10 reps - 3 sets - 1x daily - 7x weekly

## 2018-06-07 NOTE — Therapy (Signed)
[facility] [address], [place], [place], [address] Phone: [phone]   Fax:  [phone]  Physical Therapy Treatment  Patient Details  Name: Janet Nguyen MRN: [id number] Date of Birth: 08-Dec-1931 Referring Provider: Karenann Cai, [facility]   Encounter Date: 06/07/2018  PT End of Session - 06/07/18 1143    Visit Number  6    PT Start Time  1104    PT Stop Time  1134    PT Time Calculation (min)  30 min    Activity Tolerance  Patient tolerated treatment well    Behavior During Therapy  [facility] for tasks assessed/performed       Past Medical History:  Diagnosis Date  . Arrhythmia    History of SVT with documented PVC'S and  PAC'S  12/08/12 Nuc stress test normal LV EF 74%  Event Monitor  12/01/12-01/03/13  . Celiac disease    treated by Dr. [name]  . GERD (gastroesophageal reflux disease)   . Intervertebral disc stenosis of neural canal of cervical region   . Irregular heart beat 11/30/12   ECHO-EF 60-65%  . Osteoporosis   . Scoliosis   . Scoliosis   . Sleep apnea 10/02/11 [facility]   Sleep study AHI -total sleep 10.3/hr  64.0/ hr during REM sleep.RDI 22.8/hr during total sleep 64.0/hr during REM sleep The lowest O2 sat during Non-REM and REM sleep was 86% and 88% respectively. 04/08/12 CPAP/BIPAP titration study [facility]    Past Surgical History:  Procedure Laterality Date  . APPENDECTOMY     ruptured at age 59 and had surgery  . CARDIAC CATHETERIZATION  01/27/06    There were no vitals filed for this visit.  Subjective Assessment - 06/07/18 1105    Subjective  I haven't gotten into a good routine of doing the exercises.  Part of it is the challenge of being able to get into positions for the various exercises.  I get so uncomfortable without having any room in between my ribcage and pelvis in the front and sides of my trunk.  What else can I do to open that up and find  some relief?    Pertinent History  severe scoliosis, GI issues    Limitations  Sitting   laying supine   How long can you sit comfortably?  20-30 minutes max-very uncomfortable    How long can you walk comfortably?  no limits    Patient Stated Goals  reduce pain, improve posture    Currently in Pain?  Yes    Pain Score  5    can go up to a 10/10   Pain Location  Back   bil flank and abdomen   Pain Orientation  Left;Right;Lower    Pain Descriptors / Indicators  Aching;Tightness    Pain Type  Chronic pain    Pain Radiating Towards  neck    Pain Onset  More than a month ago    Pain Frequency  Constant    Aggravating Factors   sitting, lying supine, constant pain    Pain Relieving Factors  walking, stretching         [facility] PT Assessment - 06/07/18 0001      Assessment   Medical Diagnosis  chronic pain syndrome    Onset Date/Surgical Date  03/29/13   chronic  PT Education - 06/07/18 1132    Education Details   Access Code: [email or web address], how to progress exercise, aquatic exercise, activity modification    Person(s) Educated  Patient    Methods  Explanation;Demonstration;Handout    Comprehension  Verbalized understanding;Returned demonstration       PT Short Term Goals - 03/29/18 1711      PT SHORT TERM GOAL #1   Title  independent with postural correction exercises to elongate her trunk and separate rib cage from pelvis    Time  4    Period  Weeks    Status  New    Target Date  05/10/18      PT SHORT TERM GOAL #2   Title  report a 20% reduction in LBP and neck pain with sitting    Time  6    Period  Weeks    Status  New    Target Date  05/10/18      PT SHORT TERM GOAL #3   Title  --      PT SHORT TERM GOAL #4   Title  --        PT Long Term Goals - 06/07/18 1130      PT LONG TERM GOAL #1   Title  independent with postural strengthening exercises    Status  Achieved      PT LONG TERM GOAL #2   Title  report a  40% reduction in neck and low back pain with sitting    Baseline  no change in pain    Status  Not Met      PT LONG TERM GOAL #3   Title  back and cervical pain decreased to minimal during daily activities due to improve postural muscle strength    Baseline  no change in pain-pt has not been compliant with HEP    Status  Not Met            Plan - 06/07/18 1136    Clinical Impression Statement  Pt returned to the clinic with further questions about managing her pain and mobility through her HEP.  She is limited by positioning in that she cannot lay supine anymore which was a position many of her exercises were performed in.  Pt was given some new ideas in quadruped and standing to further stretch and open up areas in her spine and trunk getting compressed due to severe scoliosis.  Pt was also encouraged to consider aquatic therapy to increase her opportunities for a safe and comfortable environment in which to exercise.  PT discussed the need for the Pt to ready herself to accept some activity accommodations so that she can be successful in managing her pain and maintaining her mobility in a safe and effective way.  Pt felt encouraged by this session spent discussing self care and with the adjustments made to her HEP.    PT Next Visit Plan  DC PT to [place] access code    Consulted and Agree with Plan of Care  Patient       Patient will benefit from skilled therapeutic intervention in order to improve the following deficits and impairments:     Visit Diagnosis: Abnormal posture  Muscle weakness (generalized)  Other idiopathic scoliosis, thoracolumbar region     Problem List Patient Active Problem List   Diagnosis Date Noted  . Abdominal pain 07/01/2017  . Diverticulitis, colon   . Metabolic acidosis, increased  anion gap   . Fatigue 12/23/2015  . Sciatica of right side 10/06/2015  . History of migraine headaches 10/06/2015  . Frequent PVCs  12/28/2013  . Premature atrial contractions 12/28/2013  . PSVT (paroxysmal supraventricular tachycardia) ([place]) 12/28/2013  . Heart palpitations 07/13/2013  . Sleep apnea 04/11/2013  . Scoliosis 04/11/2013  . Atrial flutter with rapid ventricular response ([place]) 11/29/2012  . Chest pain, atypical 11/29/2012  . Fibromyalgia syndrome 11/29/2012  . Chronic steroid use 11/29/2012   PHYSICAL THERAPY DISCHARGE SUMMARY  Visits from Start of Care: 6  Current functional level related to goals / functional outcomes: See above for current status.    Remaining deficits: See above.  Pt with significant scoliosis and functional postural weakness.  Pt has HEP in place to address all deficits.   Education / Equipment: HEP Plan: Patient agrees to discharge.  Patient goals were partially met. Patient is being discharged due to being pleased with the current functional level.  ?????   Sigurd Sos, PT 06/07/18 11:54 AM   Venetia Night E Georg Ang 06/07/2018, 11:46 AM  [facility] [address], [place], [place], [address] Phone: [phone]   Fax:  [phone]  Name: Janet Nguyen MRN: [id number] Date of Birth: 09-25-1932

## 2018-07-02 ENCOUNTER — Telehealth: Payer: Self-pay | Admitting: Cardiovascular Disease

## 2018-07-02 NOTE — Telephone Encounter (Signed)
Spoke with pt. Pt already has an appt scheduled with Dr.Kelly for 07/30/18. Pt sts that she does have that appt on her calendar. Pt sts nothing further needed at this time and voiced appreciation for the call back

## 2018-07-02 NOTE — Telephone Encounter (Signed)
New Message         Patient called today to schedule an appointment, however; there is nothing available until 10/08/2018. Patient would like a call back to schedule a earlier appointment.Pls call and advise.

## 2018-07-29 DIAGNOSIS — N762 Acute vulvitis: Secondary | ICD-10-CM | POA: Diagnosis not present

## 2018-07-30 ENCOUNTER — Ambulatory Visit (INDEPENDENT_AMBULATORY_CARE_PROVIDER_SITE_OTHER): Payer: Medicare Other | Admitting: Cardiovascular Disease

## 2018-07-30 ENCOUNTER — Encounter

## 2018-07-30 ENCOUNTER — Encounter: Payer: Self-pay | Admitting: Cardiovascular Disease

## 2018-07-30 VITALS — BP 136/69 | HR 66 | Ht 62.0 in | Wt 116.8 lb

## 2018-07-30 DIAGNOSIS — I6523 Occlusion and stenosis of bilateral carotid arteries: Secondary | ICD-10-CM

## 2018-07-30 DIAGNOSIS — I471 Supraventricular tachycardia: Secondary | ICD-10-CM

## 2018-07-30 DIAGNOSIS — R5383 Other fatigue: Secondary | ICD-10-CM | POA: Diagnosis not present

## 2018-07-30 DIAGNOSIS — G4733 Obstructive sleep apnea (adult) (pediatric): Secondary | ICD-10-CM

## 2018-07-30 DIAGNOSIS — I10 Essential (primary) hypertension: Secondary | ICD-10-CM | POA: Diagnosis not present

## 2018-07-30 DIAGNOSIS — M4123 Other idiopathic scoliosis, cervicothoracic region: Secondary | ICD-10-CM | POA: Diagnosis not present

## 2018-07-30 NOTE — Patient Instructions (Signed)
Medication Instructions:  Your physician recommends that you continue on your current medications as directed. Please refer to the Current Medication list given to you today.  If you need a refill on your cardiac medications before your next appointment, please call your pharmacy.   Follow-Up: At [facility], you and your health needs are our priority.  As part of our continuing mission to provide you with exceptional heart care, we have created designated Provider Care Teams.  These Care Teams include your primary Cardiologist (physician) and Advanced Practice Providers (APPs -  Physician Assistants and Nurse Practitioners) who all work together to provide you with the care you need, when you need it. You will need a follow up appointment in 6 months.  Please call our office 2 months in advance to schedule this appointment.  You may see Shelva Majestic, MD or one of the following Advanced Practice Providers on your designated Care Team: [place], [place] . Fabian Sharp, PA-C

## 2018-07-30 NOTE — Progress Notes (Signed)
Patient ID: Janet Nguyen, female   DOB: 17-Apr-1932, 84 y.o.   MRN: [id number]     Primary  M.D.: Dr. Mardene Sayer  HPI: Janet Nguyen is a 84 y.o. female who presents for a 5 month followup cardiology evaluation.   Janet Nguyen has a history of documented SVT and also has PACs and PVCs  treated with beta blocker therapy. In April 2014 I further titrated her beta blocker therapy after cardiac event monitor revealed several bursts of recurrent SVT up to 177 beats per minute in March.  In July after she had had 2 episodes of chest fluttering which each lasted over 30 minutes which he did take metoprolol tartrate with relief I recommended further titration of her Toprol to 75 mg in the morning and 50 mg at night and if necessary she could further titrate this to 75 twice a day.  She has a history of obstructive sleep apnea but despite multiple attempts at CPAP utilization she has not been able to tolerate this. She was referred to Dr. [name] and has a customized dental appliance with mandibular advancement with improvement in some of her symptomatology. Due concern that her teeth may be moving in more recently she has has not been using customized appliance daily but has been using her old non-customized mouthguard.  At times shen has awakened abruptly from a dream with her heart pounding and a sensation of hot flashes, gasping for breath.   She also states that her scoliosis is getting worse. She does have left-sided musculoskeletal type chest pain due to her spine angulation.  Beause of her significant scoliosis, she feels she must sleep on her back.   She has a history of GERD for which she has been taking Nexium.  She presents for evaluation.  I had scheduled her for an overnight oximetry to see if she is a candidate for supplemental oxygen at nighttime since she refused to use CPAP and only very rarely uses her customized mouthpiece.  She does wear a mouthguard to reduce bruxism.   Oximetry study was performed overnight on February 23/24.  Her mean oxygen saturation was 93.12%.  She spent 12 minutes and 16 seconds with O2 sat duration below 88% with the lowest O2 sat duration of 80%. I tried to set her up for supplemental oxygen at bedtime.  However, she has been denied for this on multiple occasions by her insurance/Medicare.  She feels that she is sleeping better.  She can only sleep in her back due to scoliosis.  Her left side is concave; her right side is convex.  She has had issues with labile blood pressure. She has had issues with lower back discomfort and sciatica leading to emergency room evaluation in November 2016.  She saw Dr. [name] for neurosurgical evaluation. She has migraine headaches. She  Recently saw Dr. [name] for these migraine headaches.  She has been on Toprol-XL 75 mg twice a day for her palpitations and presently denies any awareness an extra heartbeats. She takes Xanax on an as-needed basis for anxiety.  She has been taking Nexium 20, no grams every other day for GERD. She states her scoliosis is getting worse.  Has been experiencing more fatigue.  She also notes some occasional left hand numbness.  In a mouth guard but no ureteral longer uses her mandibular advancement device and not tolerate CPAP therapy for her obstructive sleep apnea.     She had experienced left-sided chest discomfort which most likely  was related to her significant scoliosis the potential neuropathy causing intermittent left arm and hand numbness.  I slightly reduced her Toprol-XL which she had been taking 150 mg daily and a 75 twice a day regimen to 75 and milligrams in the morning and 50 mg with ultimate plan to decrease this to 50 twice a day.  She states when she reduce this, she began to notice more optical migraines and resume taking the higher dose Toprol at 75 twice a day with improvement.  She has seen Dr. [name] for her paresthesias.  She admits to significant anxiety.  She had  requested zolpidem for sleep initiation and maintenance.  In the past, we had tried 6.25 slow-release version to help with sleep maintenance, but due to cost issues preferred the 5 mg sleep initiation dose.  She experiences optical migraine headaches.  She was being seen by neurologist, Dr. Melton Alar  She continues to have issues with her scoliosis causing discomfort in her neck and arm due to her distortion.  He tells me she underwent endoscopy and colonoscopy as well as banding of hemorrhoids.  She had a benign polyp removed.  She has issues with spastic colon.  Her sleep is better with zolpiden 5 mg.  She saw Janet Nguyen on 11/13/2016.  She had noticed that her hair was "thinning" and was concerned about this being the result of Toprol.  He suggested possibly decreasing Toprol but she preferred not until she had seen me.  Since that time, she continues to experience some optical migraines.  She has noticed some leg left neck discomfort.  She denies any patches of hair loss.  She denies differential arm weakness.  She continues to have difficulty with her scoliosis with hip pain and rib cage discomfort.   When I last saw her in March 2018, there was a blood pressure differential of 118/78 in the left arm and 140/80 in the right arm.  She subsequently underwent upper extremity Doppler evaluation on 12/25/2016.  She was noted have mild heterogeneous plaque with stable 1-39% bilateral internal carotid stenoses.  She had normal subclavian arteries bilaterally.  There are patent vertebral arteries with antegrade flow.  She did not have any significant blood pressure differential and had triphasic waveforms in both her right and biphasic in her left brachial artery.  Left blood pressure was 8 mm higher than the right brachial pressure.  At times, she continues to experience some vague chest wall symptoms, which most likely related to her posture.  She denies significant shortness of breath.  She has to sleep on her  back because of her scoliosis.  Uses a chin strap to prevent oral breathing.  She is not on CPAP.   When I last saw her in June 2019 she had concerns about some of her medications possibly causing hair loss or memory loss.  She admits to having dry eyes.  She continues to have difficulty from her scoliosis taking sleep difficulty.  In the past she was documented to have obstructive sleep apnea but would not take CPAP.  I had referred her to Dr. Ron Parker remotely.  She has had issues with a raspy voice and was felt by Dr. Erik Obey to be related to dyspepsia.  She also saw Dr.Medoff who felt perhaps it was more related to nasal drainage.    Since her last evaluation, she has felt well from a cardiac standpoint.  She is unaware of any recurrent episodes of SVT.  She sleeps on her back  because of her scoliosis.  Primary recommendation she did see Dr. Oneal Grout back and apparently had a new oral appliance made.  She used this initially but then started to notice some teeth discomfort.  Apparently this again was treated by Dr. Ron Parker.  She has not been back to him since but admits to only intermittent use.  She still admits to being tired.  She has issues with her abdomen being tight which she believes is related to her scoliosis.  Her GERD is controlled with pantoprazole.  She presents for evaluation.   Past Medical History:  Diagnosis Date  . Arrhythmia    History of SVT with documented PVC'S and  PAC'S  12/08/12 Nuc stress test normal LV EF 74%  Event Monitor  12/01/12-01/03/13  . Celiac disease    treated by Dr. [name]  . GERD (gastroesophageal reflux disease)   . Intervertebral disc stenosis of neural canal of cervical region   . Irregular heart beat 11/30/12   ECHO-EF 60-65%  . Osteoporosis   . Scoliosis   . Scoliosis   . Sleep apnea 10/02/11 [facility]   Sleep study AHI -total sleep 10.3/hr  64.0/ hr during REM sleep.RDI 22.8/hr during total sleep 64.0/hr during REM sleep The lowest O2 sat  during Non-REM and REM sleep was 86% and 88% respectively. 04/08/12 CPAP/BIPAP titration study [facility]    Past Surgical History:  Procedure Laterality Date  . APPENDECTOMY     ruptured at age 73 and had surgery  . CARDIAC CATHETERIZATION  01/27/06    Allergies  Allergen Reactions  . Gluten Meal     Unknown  . Codeine Nausea Only    Current Outpatient Medications  Medication Sig Dispense Refill  . amLODipine (NORVASC) 2.5 MG tablet TAKE ONE TABLET BY MOUTH DAILY 90 tablet 1  . metoprolol succinate (TOPROL-XL) 50 MG 24 hr tablet TAKE 1 AND 1/2 TABLETS BY MOUTH 2 TIMES A DAY 270 tablet 2  . Misc Natural Products (TART CHERRY ADVANCED PO) Take by mouth.    . Multiple Vitamins-Minerals (MULTIVITAMIN GUMMIES ADULT PO) Take by mouth.    . pantoprazole (PROTONIX) 20 MG tablet Take 20 mg by mouth daily.     . Polyvinyl Alcohol-Povidone (REFRESH OP) Apply to eye.    . Wheat Dextrin (EQ FIBER POWDER PO) Take by mouth.    Marland Kitchen acetaminophen (TYLENOL) 500 MG tablet Take by mouth as needed.    . ALPRAZolam (XANAX) 0.25 MG tablet     . UNABLE TO FIND vitafusion womens gummies    . UNABLE TO FIND vitafusion hair skin and nail vitamins     No current facility-administered medications for this visit.     Socially she is divorced has 4 children 9 grandchildren. She does exercise. No tobacco use. She does occasional wine.  ROS General: Negative; No fevers, chills, or night sweats;  HEENT: Negative; No changes in vision or hearing, sinus congestion, difficulty swallowing Pulmonary: Negative; No cough, wheezing, shortness of breath, hemoptysis Cardiovascular: Positive for occasional chest wall pain and nocturnal palpitations GI: Negative; No nausea, vomiting, diarrhea, or abdominal pain GU: Negative; No dysuria, hematuria, or difficulty voiding Musculoskeletal: Positive for significant scoliosis; fibromyalgia;  joint pain, or weakness Hematologic/Oncology: Negative; no  easy bruising, bleeding Endocrine: Negative; no heat/cold intolerance; no diabetes Neuro: Negative; no changes in balance, headaches Skin: Negative; No rashes or skin lesions Psychiatric: Negative; No behavioral problems, depression Sleep: Positive for sleep apnea ; No snoring, daytime  sleepiness, hypersomnolence, bruxism, restless legs, hypnogognic hallucinations, no cataplexy Other comprehensive 14 point system review is negative.  PE BP 136/69   Pulse 66   Ht 5' 2"  (1.575 m)   Wt 116 lb 12.8 oz (53 kg)   BMI 21.36 kg/m    Repeat blood pressure by me was 140/70  Wt Readings from Last 3 Encounters:  07/30/18 116 lb 12.8 oz (53 kg)  03/18/18 115 lb (52.2 kg)  12/05/17 108 lb (49 kg)    General: Alert, oriented, no distress.  Skin: normal turgor, no rashes, warm and dry HEENT: Normocephalic, atraumatic. Pupils equal round and reactive to light; sclera anicteric; extraocular muscles intact;  Nose without nasal septal hypertrophy Mouth/Parynx benign; Mallinpatti scale 3 Neck: No JVD, no carotid bruits; normal carotid upstroke Lungs: clear to ausculatation and percussion; no wheezing or rales Chest wall: without tenderness to palpitation Heart: PMI not displaced, RRR, s1 s2 normal, 1/6 systolic murmur, no diastolic murmur, no rubs, gallops, thrills, or heaves Abdomen: soft, nontender; no hepatosplenomehaly, BS+; abdominal aorta nontender and not dilated by palpation. Back: Scoliosis Pulses 2+ Musculoskeletal: full range of motion, normal strength, no joint deformities Extremities: no clubbing cyanosis or edema, Homan's sign negative  Neurologic: grossly nonfocal; Cranial nerves grossly wnl Psychologic: Normal mood and affect    ECG (independently read by me): Normal sinus rhythm with PAC.  Ventricular rate 66.  QS V1 V2.  Mild T wave abnormality  June 2019 ECG (independently read by me): Normal sinus rhythm at 64 bpm.  Normal intervals.  No ectopy.  September 2018 ECG  (independently read by me): Normal sinus rhythm at 64 bpm.  Isolated PAC.  QTc interval 414 ms.  QS V1 V2, unchanged.  May 2018 ECG (independently read by me): Normal sinus rhythm at 65 bpm.  QS V1, V2.  Normal intervals.  No ST segment changes.  March 2018 ECG (independently read by me): Normal sinus rhythm at 65 bpm.  No ectopy.  Normal intervals.  September 2017 ECG (independently read by me): Normal sinus rhythm at 62 bpm.  QS V1 and V2.  Normal intervals.  April 2017 ECG (independently read by me): Normal sinus rhythm at 70 bpm.  No ectopy.  PR interval 158 ms and QTc interval 414 ms.  March 2017 ECG (independently read by me): normal sinus rhythm at 61 bpm..  No ST segment changes.  Normal intervals.  QTc interval 396 ms.  August 2014 ECG (independently read by me): Normal sinus rhythm at 63 bpm.  QRS couplets V1 V2.  No cigarette ST-T changes.  May 2016 ECG (independently read by me): Sinus bradycardia 59 bpm.  No ectopy.  February 2016 ECG (independently read by me): Sinus bradycardia 56 bpm.  Normal intervals.  Prior ECG (independently read by me): Normal sinus rhythm at 62 beats per minute.  QTc interval 411 milliseconds.  QRS complex V1, V2.  Normal intervals.  ECG (independently read by me): Normal sinus rhythm at 62 beats per minute. Normal intervals. QTc interval 399 ms.  Prior ECG of 07/13/2013: Sinus rhythm at 61 beats per minute. QTc interval 406 ms. PR interval normal at 168 ms.  LABS: BMP Latest Ref Rng & Units 12/05/2017 07/02/2017 07/01/2017  Glucose 65 - 99 mg/dL 129(H) 88 102(H)  BUN 6 - 20 mg/dL 13 9 13   Creatinine 0.44 - 1.00 mg/dL 0.87 0.95 0.82  Sodium 135 - 145 mmol/L 136 139 134(L)  Potassium 3.5 - 5.1 mmol/L 3.8 3.8 3.6  Chloride 101 -  111 mmol/L 100(L) 109 101  CO2 22 - 32 mmol/L 26 24 24   Calcium 8.9 - 10.3 mg/dL 9.6 8.7(L) 9.3   Hepatic Function Latest Ref Rng & Units 12/05/2017 07/01/2017 06/30/2017  Total Protein 6.5 - 8.1 g/dL 7.6 7.2 8.2(H)  Albumin  3.5 - 5.0 g/dL 4.4 4.4 5.0  AST 15 - 41 U/L 30 28 40  ALT 14 - 54 U/L 18 16 17   Alk Phosphatase 38 - 126 U/L 55 40 46  Total Bilirubin 0.3 - 1.2 mg/dL 0.6 1.3(H) 1.4(H)  Bilirubin, Direct 0.1 - 0.5 mg/dL - 0.1 -   CBC Latest Ref Rng & Units 12/05/2017 07/02/2017 07/01/2017  WBC 4.0 - 10.5 K/uL 9.2 10.1 8.3  Hemoglobin 12.0 - 15.0 g/dL 12.0 11.2(L) 14.1  Hematocrit 36.0 - 46.0 % 36.0 32.9(L) 40.8  Platelets 150 - 400 K/uL 288 224 272   Lab Results  Component Value Date   TSH 1.07 12/03/2016  Lipid Panel     Component Value Date/Time   CHOL 154 12/03/2016 0845   TRIG 81 12/03/2016 0845   HDL 67 12/03/2016 0845   CHOLHDL 2.3 12/03/2016 0845   VLDL 16 12/03/2016 0845   LDLCALC 71 12/03/2016 0845     RADIOLOGY: No results found.  IMPRESSION:  1. OSA (obstructive sleep apnea)   2. Essential hypertension, benign   3. PSVT (paroxysmal supraventricular tachycardia) (HCC)   4. Other idiopathic scoliosis, cervicothoracic region   5. Fatigue, unspecified type     ASSESSMENT AND PLAN: Ms. Scalici is an 84 year old female who has a history of documented SVT and PACs/PVCs which have been controlled with beta blocker therapy. She has documented normal systolic function on her echo in March 2014 with grade 1 diastolic dysfunction and had  significant left atrial dilatation and mild dilatation of the right ventricle with moderate dilatation of the right atrium. She has mild/moderate pulmonary hypertension with PA pressures of 42 mm. Her palpitations have resolved on  Toprol-XL 75 mg twice a day.  She was found to have obstructive sleep apnea but could not tolerate CPAP therapy.  Remotely she had seen Dr. Ron Parker and also had a mouthguard.  I had referred her back to see Dr. Ron Parker and apparently she had a new oral appliance.  She has had some difficulty with causing teeth discomfort from this device despite having Dr. Ron Parker reassess and make adjustments.  She now is using this new device approximately 1  time per week.  She continues to note fatigability and being tired.  I recommended that she see Dr. Ron Parker in follow-up in perhaps new additional adjustments can be made to her oral appliance.  She continues to use a chinstrap on nights that she does not use the oral appliance.  Remotely she never could tolerate CPAP.  I discussed alternatives such as inspire but I am not certain this would be optimal for her and she is not certain she wants to pursue this.  Her blood pressure today is upper normal to slightly increased on amlodipine 2.5 mg and Toprol 75 mg twice a day.  We discussed optimal blood pressure being less than 130/80.  She continues to be on pantoprazole for GERD.  She is bothered by her scoliosis and the way her scoliosis angles her back she believes this creates more tension on her abdominal musculature.  I reassured her that this was not heart related.  Her palpitations have improved with increased beta-blocker regimen.  She has documented ejection fraction  at 73 to 60% with grade 1 diastolic dysfunction, mitral annular calcification with mild MR and mild PA peak pressure at 34 mm on her most recent echo Doppler study of October 2018.  She will be seeing her primary MD.  She prefers to see me at six-month intervals.  I will see her in 6 months for reevaluation.  Time spent: 25 minutes Troy Sine, MD, [facility]  07/31/2018 2:31 PM

## 2018-07-31 ENCOUNTER — Encounter: Payer: Self-pay | Admitting: Cardiovascular Disease

## 2018-08-02 ENCOUNTER — Other Ambulatory Visit: Payer: Self-pay | Admitting: Cardiovascular Disease

## 2018-08-02 NOTE — Telephone Encounter (Signed)
Rx(s) sent to pharmacy electronically.  

## 2018-08-13 DIAGNOSIS — M545 Low back pain: Secondary | ICD-10-CM | POA: Diagnosis not present

## 2018-08-30 ENCOUNTER — Telehealth: Payer: Self-pay | Admitting: Cardiovascular Disease

## 2018-08-30 NOTE — Telephone Encounter (Signed)
Called patient, she advised that a diagnosis was given to her new insurance company and it is not the correct one, Patient did not say which insurance company, patient was upset on the phone, unsure what of diagnosis she currently has. I advised of what the diagnosis were from her last visit and patient states this was not the correct ones. I advised patient if the insurance company was to call us and request more information regarding the diagnosis we would be glad to assist, patient requested that I call her insurance company, I did advise patient I would be glad to help her, but not sure which insurance company to call. Patient then got upset and hung the phone up.

## 2018-08-30 NOTE — Telephone Encounter (Signed)
Patient would like a call back.  She states she is getting new insurance and [employer] company was given the wrong diagnosis for her. This will end up costing her more.

## 2018-09-06 ENCOUNTER — Other Ambulatory Visit: Payer: Self-pay | Admitting: Cardiovascular Disease

## 2018-09-17 DIAGNOSIS — H43813 Vitreous degeneration, bilateral: Secondary | ICD-10-CM | POA: Diagnosis not present

## 2018-09-17 DIAGNOSIS — H40013 Open angle with borderline findings, low risk, bilateral: Secondary | ICD-10-CM | POA: Diagnosis not present

## 2018-09-17 DIAGNOSIS — H353132 Nonexudative age-related macular degeneration, bilateral, intermediate dry stage: Secondary | ICD-10-CM | POA: Diagnosis not present

## 2018-09-17 DIAGNOSIS — H35372 Puckering of macula, left eye: Secondary | ICD-10-CM | POA: Diagnosis not present

## 2018-10-08 ENCOUNTER — Ambulatory Visit: Payer: Medicare Other | Admitting: Cardiovascular Disease

## 2018-10-11 ENCOUNTER — Ambulatory Visit: Payer: Medicare Other | Admitting: Cardiovascular Disease

## 2018-10-14 DIAGNOSIS — M79671 Pain in right foot: Secondary | ICD-10-CM | POA: Diagnosis not present

## 2018-10-14 DIAGNOSIS — G894 Chronic pain syndrome: Secondary | ICD-10-CM | POA: Diagnosis not present

## 2018-10-14 DIAGNOSIS — M5136 Other intervertebral disc degeneration, lumbar region: Secondary | ICD-10-CM | POA: Diagnosis not present

## 2018-10-21 DIAGNOSIS — M79673 Pain in unspecified foot: Secondary | ICD-10-CM | POA: Diagnosis not present

## 2018-10-21 DIAGNOSIS — F419 Anxiety disorder, unspecified: Secondary | ICD-10-CM | POA: Diagnosis not present

## 2018-10-21 DIAGNOSIS — G479 Sleep disorder, unspecified: Secondary | ICD-10-CM | POA: Diagnosis not present

## 2018-10-21 DIAGNOSIS — R0602 Shortness of breath: Secondary | ICD-10-CM | POA: Diagnosis not present

## 2018-10-21 DIAGNOSIS — M419 Scoliosis, unspecified: Secondary | ICD-10-CM | POA: Diagnosis not present

## 2018-10-21 DIAGNOSIS — M545 Low back pain: Secondary | ICD-10-CM | POA: Diagnosis not present

## 2018-11-08 DIAGNOSIS — G43109 Migraine with aura, not intractable, without status migrainosus: Secondary | ICD-10-CM | POA: Diagnosis not present

## 2018-11-08 DIAGNOSIS — G4733 Obstructive sleep apnea (adult) (pediatric): Secondary | ICD-10-CM | POA: Diagnosis not present

## 2018-11-08 DIAGNOSIS — R070 Pain in throat: Secondary | ICD-10-CM | POA: Diagnosis not present

## 2018-11-08 DIAGNOSIS — H9113 Presbycusis, bilateral: Secondary | ICD-10-CM | POA: Diagnosis not present

## 2019-01-05 ENCOUNTER — Telehealth: Payer: Self-pay | Admitting: Neurology

## 2019-01-05 NOTE — Telephone Encounter (Signed)
Called the patient to inform them that our office has placed new protocols in place for our office visits. Due to Covid 19 our office is reducing our number of office visits in order to minimize the risk to our patients and healthcare providers.Our office is now providing the capability to offer the patients virtual visits at this time. Informed of what that process looks like and informed that the Virtual visit will still be billed through insurance as such.   patient was unaware initially what apt this was. I explained to her that it was a sleep consult.patient was nervous to the idea of completing the apt on an app, she states she is not tech savy and there is no one able to help her. Pt does have a I phone and tablet. Advised that I could either attempt to help her with this or we could push the apt out further. She didn't know what she should do at first. Advised her I could help with downloading the app and she states this is not a great time and unable to do it at this time. Informed her to call back when she is ready to get set up. Patient can discuss getting set up with Lovena Le or myself online.

## 2019-01-06 DIAGNOSIS — K458 Other specified abdominal hernia without obstruction or gangrene: Secondary | ICD-10-CM | POA: Diagnosis not present

## 2019-01-06 NOTE — Telephone Encounter (Signed)
Due to current COVID 19 pandemic, our office is severely reducing in office visits until further notice, in order to minimize the risk to our patients and healthcare providers.   Called patient again and was able to get her to agree to a virtual visit. I spoke with patient for 15 minutes explaining the steps of setting up the webex meeting in great detail. I advised patient that she will be receiving an e-mail with directions, as well as the link she will need to use to connect at the time of her appointment. Patient is aware nurse Myriam Jacobson will call her to update chart history, and office staff will call patient prior to her appointment to check her in. Patient verbalized understanding.  Pt understands that although there may be some limitations with this type of visit, we will take all precautions to reduce any security or privacy concerns.  Pt understands that this will be treated like an in office visit and we will file with pt's insurance, and there may be a patient responsible charge related to this service.  Pt's email is [email or web address]. Pt understands that the [employer] webex software must be downloaded and operational on the device pt plans to use for the visit.

## 2019-01-13 ENCOUNTER — Ambulatory Visit (INDEPENDENT_AMBULATORY_CARE_PROVIDER_SITE_OTHER): Payer: Medicare Other | Admitting: Neurology

## 2019-01-13 ENCOUNTER — Other Ambulatory Visit: Payer: Self-pay

## 2019-01-13 ENCOUNTER — Encounter

## 2019-01-13 DIAGNOSIS — R0683 Snoring: Secondary | ICD-10-CM | POA: Diagnosis not present

## 2019-01-13 DIAGNOSIS — Z789 Other specified health status: Secondary | ICD-10-CM

## 2019-01-13 DIAGNOSIS — G4733 Obstructive sleep apnea (adult) (pediatric): Secondary | ICD-10-CM

## 2019-01-13 DIAGNOSIS — M41113 Juvenile idiopathic scoliosis, cervicothoracic region: Secondary | ICD-10-CM | POA: Diagnosis not present

## 2019-01-13 DIAGNOSIS — I4892 Unspecified atrial flutter: Secondary | ICD-10-CM

## 2019-01-13 DIAGNOSIS — F40240 Claustrophobia: Secondary | ICD-10-CM

## 2019-01-13 DIAGNOSIS — G8929 Other chronic pain: Secondary | ICD-10-CM

## 2019-01-13 DIAGNOSIS — G4701 Insomnia due to medical condition: Secondary | ICD-10-CM | POA: Diagnosis not present

## 2019-01-13 NOTE — Progress Notes (Signed)
Virtual Visit via Video Note  I connected with [name] on 01/13/19 at 11:00 AM EDT by a video enabled telemedicine application and verified that I am speaking with the correct person using two identifiers.   I discussed the limitations of evaluation and management by telemedicine and the availability of in person appointments. The patient expressed understanding and agreed to proceed.   Larey Seat, MD      S[facility]   Provider:  Larey Seat, M D  Primary Care Physician:  Janet Cruel, MD  Patient has been seen by Janet Nguyen , MD until 2016 - ophthalmic migraine.  Referring Provider: Lawerance Cruel, MD    HPI:  Janet Nguyen is a 85 y.o. female , former Patient of Janet Nguyen and seen here upon referral by Janet Nguyen for "Sleep apnea". She was for many years followed by JanetLewitt.   The patient had a sleep study 2013 at [facility], she took a medication to sleep there when she underwent testing. She was referred and her PSG interpreted by Dr Shelva Nguyen, her cardiologist.  The results were supposingly positive for OSA, AHI was 10.2/h. but the patient has been claustrophobic and could never tolerate CPAP with any of the interfaces provided. She tried a dental device with Janet Nguyen with the same limited result.  She reports being in chronic pain, DDD, scoliosis, muscle and joint aches.    Chief complaint according to patient : "I don't know why we have to meet today, I am in pain, I am fatigued."   Medical History: Ophthalmic migraines, she has been seen by Janet Nguyen in the past, she has followed with Dr. Enriqueta Shutter.  Scoliosis causing discomfort in neck and arm due to distortion, inability to sleep because of pain, she reports having a spastic colon".  Chronic insomnia.  Mild bilateral internal carotid stenosis, chest pain, claustrophobia inability to tolerate CPAP, she continues to have difficulties  related to her scoliosis causing sleep problems.  Janet Nguyen felt that her raspy voice was related to dyspepsia.  Chronic nasal congestion.  History of SVTs, atrial flutter, normal echocardiogram in 2014 very mild obstructive sleep apnea in January 2013 at the [facility], metoprolol has controlled supraventricular tachycardia, no prolonged oxygen desaturation during sleep.  Sleep habits are as follows: The patient reports that she goes to bed usually between 11 and midnight she falls asleep but is chronically dependent on Ambien which has been provided by Janet Nguyen she takes 5 mg, she also listens to the application on her smart phone"CALM",. She sleeps in an elevated bed needs for orthopedic reasons with scoliosis to have more support, she often wakes up from pain, she tries to sleep supine she has one bathroom break each night and she wakes up and rises between 6 and 7 AM averaging 6 hours of sleep.  She does not take naps in daytime and endorsed the Epworth sleepiness score at only 5 out of 24 points.   Social history:  Regular walking for exercise. Widowed, 4 adult children, no pets, drinks hot tea, iced tea and wine and vodka.no tobacco use.  She did state that she drinks wine or vodka she has a gluten intolerance which does not allow her to drink beer.  She has numerous grandchildren.    Review of Systems: Out of a complete 14 system review, the patient complains of only the following symptoms, and all other reviewed systems  are negative. How likely are you to doze in the following situations: 0 = not likely, 1 = slight chance, 2 = moderate chance, 3 = high chance  Sitting and Reading? Watching Television? Sitting inactive in a public place (theater or meeting)? Lying down in the afternoon when circumstances permit? Sitting and talking to someone? Sitting quietly after lunch without alcohol? In a car, while stopped for a few minutes in traffic? As a passenger in a  car for an hour without a break?  Total = 5/ 24 points  Stomach pain, claustrophobia, scoliosis , insomnia. Pain. Leg pain,  back pain.  Insomnia from pain-  Memory described as poor.    Social History   Socioeconomic History  . Marital status: Divorced    Spouse name: Not on file  . Number of children: 4  . Years of education: college   . Highest education level: Not on file  Occupational History  . Occupation: retired   Scientific laboratory technician  . Financial resource strain: Not on file  . Food insecurity:    Worry: Not on file    Inability: Not on file  . Transportation needs:    Medical: Not on file    Non-medical: Not on file  Tobacco Use  . Smoking status: Never Smoker  . Smokeless tobacco: Never Used  Substance and Sexual Activity  . Alcohol use: Yes    Alcohol/week: 1.0 standard drinks    Types: 1 Standard drinks or equivalent per week    Comment: drink ETOH socially  . Drug use: No  . Sexual activity: Not on file  Lifestyle  . Physical activity:    Days per week: Not on file    Minutes per session: Not on file  . Stress: Not on file  Relationships  . Social connections:    Talks on phone: Not on file    Gets together: Not on file    Attends religious service: Not on file    Active member of club or organization: Not on file    Attends meetings of clubs or organizations: Not on file    Relationship status: Not on file  . Intimate partner violence:    Fear of current or ex partner: Not on file    Emotionally abused: Not on file    Physically abused: Not on file    Forced sexual activity: Not on file  Other Topics Concern  . Not on file  Social History Narrative   Lives alone at home   Drinks tea but tries to drink decaf   Has 9 grandchildren     Family History  Problem Relation Age of Onset  . Breast cancer Mother   . Heart disease Father     Past Medical History:  Diagnosis Date  . Arrhythmia    History of SVT with documented PVC'S and  PAC'S   12/08/12 Nuc stress test normal LV EF 74%  Event Monitor  12/01/12-01/03/13  . Celiac disease    treated by Janet Nguyen  . GERD (gastroesophageal reflux disease)   . Intervertebral disc stenosis of neural canal of cervical region   . Irregular heart beat 11/30/12   ECHO-EF 60-65%  . Osteoporosis   . Scoliosis   . Scoliosis   . Sleep apnea 10/02/11 [facility]   Sleep study AHI -total sleep 10.3/hr  64.0/ hr during REM sleep.RDI 22.8/hr during total sleep 64.0/hr during REM sleep The lowest O2 sat during Non-REM and REM sleep was 86%  and 88% respectively. 04/08/12 CPAP/BIPAP titration study [facility]    Past Surgical History:  Procedure Laterality Date  . APPENDECTOMY     ruptured at age 13 and had surgery  . CARDIAC CATHETERIZATION  01/27/06    Current Outpatient Medications  Medication Sig Dispense Refill  . acetaminophen (TYLENOL) 500 MG tablet Take by mouth as needed.    . ALPRAZolam (XANAX) 0.25 MG tablet     . amLODipine (NORVASC) 2.5 MG tablet TAKE ONE TABLET BY MOUTH DAILY 90 tablet 3  . metoprolol succinate (TOPROL-XL) 50 MG 24 hr tablet TAKE 1 AND 1/2 TABLETS BY MOUTH 2 TIMES A DAY 270 tablet 3  . Misc Natural Products (TART CHERRY ADVANCED PO) Take by mouth.    . Multiple Vitamins-Minerals (MULTIVITAMIN GUMMIES ADULT PO) Take by mouth.    . pantoprazole (PROTONIX) 20 MG tablet Take 20 mg by mouth daily.     . Polyvinyl Alcohol-Povidone (REFRESH OP) Apply to eye.    Marland Kitchen UNABLE TO FIND vitafusion womens gummies    . UNABLE TO FIND vitafusion hair skin and nail vitamins    . Wheat Dextrin (EQ FIBER POWDER PO) Take by mouth.     No current facility-administered medications for this visit.     Allergies as of 01/13/2019 - Review Complete 07/31/2018  Allergen Reaction Noted  . Gluten meal  11/29/2012  . Codeine Nausea Only 07/12/2012    Vitals: There were no vitals taken for this visit. Last Weight:  Wt Readings from Last 1 Encounters:   07/30/18 116 lb 12.8 oz (53 kg)   HEN:IDPOE is no height or weight on file to calculate BMI.     Last Height:   Ht Readings from Last 1 Encounters:  07/30/18 5' 2"  (1.575 m)    Observations/Objective: General: The patient is awake, alert and appears not in acute distress. The patient is well groomed. Head: Normocephalic, atraumatic. Neck is restricted in ROM.  Rotation is limited.   Mallampati 3 ,  neck circumference:  Nasal airflow patent ,  Retrognathia is not seen.  The patient has scoliosis and DDD, has lost 4 inches of her hight.   Respiratory: no tachypnea. Feels tight.  Neurologic exam : The patient is awake and alert, oriented to place and time.    Attention span & concentration ability appears normal, she has Logorrhea.  Speech is fluent, without dysarthria, but with hoarseness- mild dysphonia . No evidence of aphasia.  Mood and affect are appropriate.  Cranial nerves: Pupils are equal. Facial motor strength is symmetric and tongue and uvula move midline. Shoulder shrug was symmetrical. She still has biological teeth.   Motor exam: muscle bulk and symmetric strength in upper extremities.  Coordination: Rapid alternating movements in the fingers/hands was normal without evidence of ataxia, dysmetria or tremor.  Gait and station: Patient walks without assistive device - but has DDD, scoliosis.    The scope of my practice does not include pain treatment, and she has had contact with Janet Nguyen here for scoliosis related neck pain, ophthalmic headaches.  Since Dr. Percell Boston retirement Janet Nguyen has absorbed many of the former patients of our colleague.    I have asked the patient for now to bear with me : I do not think her insomnia is related to apnea but to pain.  I want to prove that there is still apnea to a level present that requires any intervention.  At this time I am not sure about this  at all.  We will do a home sleep test for obvious reasons and why the  patient cannot sleep in her home bed better than in the sleep lab.   I also would like her to follow-up with me only  if a treat-worthy degree of  apnea is found.   Assessment and Plan:  85 y.o. year old female here with chronic insomnia,  Had mild AHI in previous PSG, "loud snoring- which doesn't bother anybody ", failed CPAP with Dr [name] due to claustrophobia.  Failed dental device from Janet Nguyen, teeth began shifting. Has long-standing scoliosis, polymyalgia rheumatica, dry eyes, chronic pain and head and neck, shoulders.  Neurologic observation was limited. /examination unremarkable. Advised follow-up with PCP in pain management clinic. No further neurodiagnostic testing advised.   PLAN:  HST to rule in/ out sleep apnea. It is not likely cause of her Insomnia.  Risk factor for atrial fibrillation, she has SVT. PVCs. Dr [name] follows.  Scoliosis may affect airway and thoracic expansion, however I can't measure C02 in a HST.    I believe her insomnia is pain related/ there is lot of discomfort.   pain mgmt per Dr. Nelva Bush and PCP- this will improve her sleep more than anything.   Return if symptoms worsen or fail to improve, for return to PCP.    I discussed the assessment and treatment plan with the patient. The patient was provided an opportunity to ask questions and all were answered. The patient agreed with the plan and demonstrated an understanding of the instructions.   The patient was advised to call back or seek an in-person evaluation if the symptoms worsen or if the condition fails to improve as anticipated.  I provided 35 minutes of non-face-to-face time during this encounter. In addition I reviewed many records form a other specialists, ENT , Cardio, ophthalmology, and PCP.   HST   Larey Seat, MD 1/54/0086, 76:19 AM  Certified in Neurology by ABPN Certified in Sleep Medicine by [facility] [address], [place], [place]  [address]              Larey Seat MD 01/13/2019

## 2019-01-13 NOTE — Patient Instructions (Addendum)
Please remember to try to maintain good sleep hygiene, which means: Keep a regular sleep and wake schedule, try not to exercise or have a meal within 2 hours of your bedtime, try to keep your bedroom conducive for sleep, that is, cool and dark, without light distractors such as an illuminated alarm clock, and refrain from watching TV right before sleep or in the middle of the night and do not keep the TV or radio on during the night. Also, try not to use or play on electronic devices at bedtime, such as your cell phone, tablet PC or laptop. If you like to read at bedtime on an electronic device, try to dim the background light as much as possible. Do not eat in the middle of the night.   We will request a sleep study, and it will be a home sleep test. .    We will look for snoring, low oxygen levels or sleep apnea.    For chronic insomnia, you are best followed by a psychiatrist and/or sleep psychologist.  Chronic pain is not treated at this facility.  We will call you with the  HST -sleep study results and make a follow up appointment if needed.

## 2019-01-27 ENCOUNTER — Ambulatory Visit: Payer: Medicare Other | Admitting: Cardiovascular Disease

## 2019-02-09 ENCOUNTER — Ambulatory Visit: Payer: Medicare Other | Admitting: Cardiovascular Disease

## 2019-02-25 DIAGNOSIS — H9313 Tinnitus, bilateral: Secondary | ICD-10-CM | POA: Diagnosis not present

## 2019-02-25 DIAGNOSIS — M4127 Other idiopathic scoliosis, lumbosacral region: Secondary | ICD-10-CM | POA: Diagnosis not present

## 2019-02-25 DIAGNOSIS — K219 Gastro-esophageal reflux disease without esophagitis: Secondary | ICD-10-CM | POA: Diagnosis not present

## 2019-02-25 DIAGNOSIS — G4733 Obstructive sleep apnea (adult) (pediatric): Secondary | ICD-10-CM | POA: Diagnosis not present

## 2019-02-25 DIAGNOSIS — J310 Chronic rhinitis: Secondary | ICD-10-CM | POA: Diagnosis not present

## 2019-02-25 DIAGNOSIS — H9113 Presbycusis, bilateral: Secondary | ICD-10-CM | POA: Diagnosis not present

## 2019-02-25 DIAGNOSIS — G43109 Migraine with aura, not intractable, without status migrainosus: Secondary | ICD-10-CM | POA: Diagnosis not present

## 2019-02-25 DIAGNOSIS — R070 Pain in throat: Secondary | ICD-10-CM | POA: Diagnosis not present

## 2019-03-04 DIAGNOSIS — B351 Tinea unguium: Secondary | ICD-10-CM | POA: Diagnosis not present

## 2019-03-04 DIAGNOSIS — I10 Essential (primary) hypertension: Secondary | ICD-10-CM | POA: Diagnosis not present

## 2019-03-04 DIAGNOSIS — R0781 Pleurodynia: Secondary | ICD-10-CM | POA: Diagnosis not present

## 2019-03-11 DIAGNOSIS — M79674 Pain in right toe(s): Secondary | ICD-10-CM | POA: Diagnosis not present

## 2019-03-11 DIAGNOSIS — L600 Ingrowing nail: Secondary | ICD-10-CM | POA: Diagnosis not present

## 2019-03-11 DIAGNOSIS — M79675 Pain in left toe(s): Secondary | ICD-10-CM | POA: Diagnosis not present

## 2019-03-15 ENCOUNTER — Encounter: Payer: Self-pay | Admitting: Cardiovascular Disease

## 2019-03-15 ENCOUNTER — Ambulatory Visit (INDEPENDENT_AMBULATORY_CARE_PROVIDER_SITE_OTHER): Payer: Medicare Other | Admitting: Cardiovascular Disease

## 2019-03-15 ENCOUNTER — Other Ambulatory Visit: Payer: Self-pay

## 2019-03-15 VITALS — BP 110/62 | HR 74 | Temp 97.3°F | Ht 62.0 in | Wt 114.6 lb

## 2019-03-15 DIAGNOSIS — B351 Tinea unguium: Secondary | ICD-10-CM

## 2019-03-15 DIAGNOSIS — I340 Nonrheumatic mitral (valve) insufficiency: Secondary | ICD-10-CM

## 2019-03-15 DIAGNOSIS — I519 Heart disease, unspecified: Secondary | ICD-10-CM

## 2019-03-15 DIAGNOSIS — I5189 Other ill-defined heart diseases: Secondary | ICD-10-CM

## 2019-03-15 DIAGNOSIS — G43109 Migraine with aura, not intractable, without status migrainosus: Secondary | ICD-10-CM | POA: Diagnosis not present

## 2019-03-15 DIAGNOSIS — G4733 Obstructive sleep apnea (adult) (pediatric): Secondary | ICD-10-CM | POA: Diagnosis not present

## 2019-03-15 DIAGNOSIS — I10 Essential (primary) hypertension: Secondary | ICD-10-CM | POA: Diagnosis not present

## 2019-03-15 NOTE — Patient Instructions (Signed)
Medication Instructions:  The current medical regimen is effective;  continue present plan and medications.  If you need a refill on your cardiac medications before your next appointment, please call your pharmacy.   Follow-Up: At [facility], you and your health needs are our priority.  As part of our continuing mission to provide you with exceptional heart care, we have created designated Provider Care Teams.  These Care Teams include your primary Cardiologist (physician) and Advanced Practice Providers (APPs -  Physician Assistants and Nurse Practitioners) who all work together to provide you with the care you need, when you need it. You will need a follow up appointment in 6-8 months.  Please call our office 2 months in advance to schedule this appointment.  You may see Shelva Majestic, MD or one of the following Advanced Practice Providers on your designated Care Team: [place], [place] . Fabian Sharp, PA-C

## 2019-03-15 NOTE — Progress Notes (Signed)
Patient ID: Janet Nguyen, female   DOB: January 07, 1932, 85 y.o.   MRN: [id number]     Primary  M.D.: Dr. Mardene Nguyen  HPI: Janet Nguyen is a 85 y.o. female who presents for a 7 month followup cardiology evaluation.   Janet Nguyen has a history of documented SVT and also has PACs and PVCs  treated with beta blocker therapy. In April 2014 I further titrated her beta blocker therapy after cardiac event monitor revealed several bursts of recurrent SVT up to 177 beats per minute in March.  In July after she had had 2 episodes of chest fluttering which each lasted over 30 minutes which he did take metoprolol tartrate with relief I recommended further titration of her Toprol to 75 mg in the morning and 50 mg at night and if necessary she could further titrate this to 75 twice a day.  She has a history of obstructive sleep apnea but despite multiple attempts at CPAP utilization she has not been able to tolerate this. She was referred to Dr. Alanson Nguyen and has a customized dental appliance with mandibular advancement with improvement in some of her symptomatology. Due concern that her teeth may be moving in more recently she has has not been using customized appliance daily but has been using her old non-customized mouthguard.  At times shen has awakened abruptly from a dream with her heart pounding and a sensation of hot flashes, gasping for breath.   She also states that her scoliosis is getting worse. She does have left-sided musculoskeletal type chest pain due to her spine angulation.  Beause of her significant scoliosis, she feels she must sleep on her back.   She has a history of GERD for which she has been taking Nexium.  She presents for evaluation.  I had scheduled her for an overnight oximetry to see if she is a candidate for supplemental oxygen at nighttime since she refused to use CPAP and only very rarely uses her customized mouthpiece.  She does wear a mouthguard to reduce bruxism.   Oximetry study was performed overnight on February 23/24.  Her mean oxygen saturation was 93.12%.  She spent 12 minutes and 16 seconds with O2 sat duration below 88% with the lowest O2 sat duration of 80%. I tried to set her up for supplemental oxygen at bedtime.  However, she has been denied for this on multiple occasions by her insurance/Medicare.  She feels that she is sleeping better.  She can only sleep in her back due to scoliosis.  Her left side is concave; her right side is convex.  She has had issues with labile blood pressure. She has had issues with lower back discomfort and sciatica leading to emergency room evaluation in November 2016.  She saw Dr. Rita Nguyen for neurosurgical evaluation. She has migraine headaches. She  Recently saw Dr. Lennie Nguyen for these migraine headaches.  She has been on Toprol-XL 75 mg twice a day for her palpitations and presently denies any awareness an extra heartbeats. She takes Xanax on an as-needed basis for anxiety.  She has been taking Nexium 20, no grams every other day for GERD. She states her scoliosis is getting worse.  Has been experiencing more fatigue.  She also notes some occasional left hand numbness.  In a mouth guard but no ureteral longer uses her mandibular advancement device and not tolerate CPAP therapy for her obstructive sleep apnea.     She had experienced left-sided chest discomfort which most likely  was related to her significant scoliosis the potential neuropathy causing intermittent left arm and hand numbness.  I slightly reduced her Toprol-XL which she had been taking 150 mg daily and a 75 twice a day regimen to 75 and milligrams in the morning and 50 mg with ultimate plan to decrease this to 50 twice a day.  She states when she reduce this, she began to notice more optical migraines and resume taking the higher dose Toprol at 75 twice a day with improvement.  She has seen Dr. Lennie Nguyen for her paresthesias.  She admits to significant anxiety.  She had  requested zolpidem for sleep initiation and maintenance.  In the past, we had tried 6.25 slow-release version to help with sleep maintenance, but due to cost issues preferred the 5 mg sleep initiation dose.  She experiences optical migraine headaches.  She was being seen by neurologist, Dr. Melton Nguyen  She continues to have issues with her scoliosis causing discomfort in her neck and arm due to her distortion.  He tells me she underwent endoscopy and colonoscopy as well as banding of hemorrhoids.  She had a benign polyp removed.  She has issues with spastic colon.  Her sleep is better with zolpiden 5 mg.  She saw Janet Nguyen on 11/13/2016.  She had noticed that her hair was "thinning" and was concerned about this being the result of Toprol.  He suggested possibly decreasing Toprol but she preferred not until she had seen me.  Since that time, she continues to experience some optical migraines.  She has noticed some leg left neck discomfort.  She denies any patches of hair loss.  She denies differential arm weakness.  She continues to have difficulty with her scoliosis with hip pain and rib cage discomfort.   When I saw her in March 2018, there was a blood pressure differential of 118/78 in the left arm and 140/80 in the right arm.  She subsequently underwent upper extremity Doppler evaluation on 12/25/2016.  She was noted have mild heterogeneous plaque with stable 1-39% bilateral internal carotid stenoses.  She had normal subclavian arteries bilaterally.  There are patent vertebral arteries with antegrade flow.  She did not have any significant blood pressure differential and had triphasic waveforms in both her right and biphasic in her left brachial artery.  Left blood pressure was 8 mm higher than the right brachial pressure.  At times, she continues to experience some vague chest wall symptoms, which most likely related to her posture.  She denies significant shortness of breath.  She has to sleep on her back  because of her scoliosis.  Uses a chin strap to prevent oral breathing.  She is not on CPAP.   In June 2019 she had concerns about some of her medications possibly causing hair loss or memory loss.  She admits to having dry eyes.  She continues to have difficulty from her scoliosis taking sleep difficulty.  In the past she was documented to have obstructive sleep apnea but would not take CPAP.  I had referred her to Dr. Ron Parker remotely.  She has had issues with a raspy voice and was felt by Dr. Erik Obey to be related to dyspepsia.  She also saw Dr.Medoff who felt perhaps it was more related to nasal drainage.    I last saw her in November 2019 at which time she,  felt well from a cardiac standpoint.  She is unaware of any recurrent episodes of SVT.  She sleeps on her back  because of her scoliosis.  Per my recommendation she did see Dr. Oneal Grout back and apparently had a new oral appliance made.  She used this initially but then started to notice some teeth discomfort.  Apparently this again was treated by Dr. Ron Parker.  She has not been back to him since but admits to only intermittent use.  She still admits to being tired.  She has issues with her abdomen being tight which she believes is related to her scoliosis.  Her GERD is controlled with pantoprazole.    Since I last saw her she was seen by Dr. Mardene Nguyen for her primary care.  She had developed a fungal infection of her toenail.  He had given her to [place] fine 20 mg.  She has not started this and was hesitant to do this due to potential interaction with metoprolol.  She also was evaluated by Dr. Brett Fairy.  According to Ms. Stock she did not evaluate her ophthalmic migraines.  However she was planning to do a possible home study to reassess her sleep apnea.  In the past she did not tolerate any CPAP therapy and although initially tolerated customized oral appliance this seemed to cause difficulty with movement of her teeth.  She has been using the  CALM app on her smart phone to help with relaxation and improve sleep and is chronically dependent on low-dose zolpidem prescribed by Dr. Harrington Challenger.  She denies chest pain.  Her palpitations are well controlled with her current dose of metoprolol 75 mils twice a day and she can to be on low-dose amlodipine 2.5 mg.  She presents for reevaluation.  Past Medical History:  Diagnosis Date   Arrhythmia    History of SVT with documented PVC'S and  PAC'S  12/08/12 Nuc stress test normal LV EF 74%  Event Monitor  12/01/12-01/03/13   Celiac disease    treated by Dr. [name]   GERD (gastroesophageal reflux disease)    Intervertebral disc stenosis of neural canal of cervical region    Irregular heart beat 11/30/12   ECHO-EF 60-65%   Osteoporosis    Scoliosis    Scoliosis    Sleep apnea 10/02/11 [facility]   Sleep study AHI -total sleep 10.3/hr  64.0/ hr during REM sleep.RDI 22.8/hr during total sleep 64.0/hr during REM sleep The lowest O2 sat during Non-REM and REM sleep was 86% and 88% respectively. 04/08/12 CPAP/BIPAP titration study [facility]    Past Surgical History:  Procedure Laterality Date   APPENDECTOMY     ruptured at age 48 and had surgery   CARDIAC CATHETERIZATION  01/27/06    Allergies  Allergen Reactions   Gluten Meal     Unknown   Codeine Nausea Only    Current Outpatient Medications  Medication Sig Dispense Refill   acetaminophen (TYLENOL) 500 MG tablet Take by mouth as needed.     ALPRAZolam (XANAX) 0.25 MG tablet      amLODipine (NORVASC) 2.5 MG tablet TAKE ONE TABLET BY MOUTH DAILY 90 tablet 3   metoprolol succinate (TOPROL-XL) 50 MG 24 hr tablet TAKE 1 AND 1/2 TABLETS BY MOUTH 2 TIMES A DAY 270 tablet 3   Misc Natural Products (TART CHERRY ADVANCED PO) Take by mouth.     Multiple Vitamins-Minerals (MULTIVITAMIN GUMMIES ADULT PO) Take by mouth.     pantoprazole (PROTONIX) 20 MG tablet Take 20 mg by mouth daily.       Polyvinyl Alcohol-Povidone (REFRESH OP) Apply  to eye.     UNABLE TO FIND vitafusion womens gummies     UNABLE TO FIND vitafusion hair skin and nail vitamins     Wheat Dextrin (EQ FIBER POWDER PO) Take by mouth.     No current facility-administered medications for this visit.     Socially she is divorced has 4 children 9 grandchildren. She does exercise. No tobacco use. She does occasional wine.  ROS General: Negative; No fevers, chills, or night sweats;  HEENT: Negative; No changes in vision or hearing, sinus congestion, difficulty swallowing Pulmonary: Negative; No cough, wheezing, shortness of breath, hemoptysis Cardiovascular: Positive for occasional chest wall pain and nocturnal palpitations GI: Negative; No nausea, vomiting, diarrhea, or abdominal pain GU: Negative; No dysuria, hematuria, or difficulty voiding Musculoskeletal: Positive for significant scoliosis; fibromyalgia;  joint pain, or weakness Hematologic/Oncology: Negative; no easy bruising, bleeding Endocrine: Negative; no heat/cold intolerance; no diabetes Neuro: Negative; no changes in balance, headaches Skin: Positive for fungal infection of her toenail Psychiatric: Negative; No behavioral problems, depression Sleep: Positive for sleep apnea ; No snoring, daytime sleepiness, hypersomnolence, bruxism, restless legs, hypnogognic hallucinations, no cataplexy Other comprehensive 14 point system review is negative.  PE BP 110/62    Pulse 74    Temp (!) 97.3 F (36.3 C)    Ht 5' 2"  (1.575 m)    Wt 114 lb 9.6 oz (52 kg)    SpO2 97%    BMI 20.96 kg/m    Repeat blood pressure by me 120/70  Wt Readings from Last 3 Encounters:  03/15/19 114 lb 9.6 oz (52 kg)  07/30/18 116 lb 12.8 oz (53 kg)  03/18/18 115 lb (52.2 kg)     Physical Exam BP 110/62    Pulse 74    Temp (!) 97.3 F (36.3 C)    Ht 5' 2"  (1.575 m)    Wt 114 lb 9.6 oz (52 kg)    SpO2 97%    BMI 20.96 kg/m  General: Alert, oriented, no distress.  Skin:  normal turgor, no rashes, warm and dry HEENT: Normocephalic, atraumatic. Pupils equal round and reactive to light; sclera anicteric; extraocular muscles intact; Fundi ** Nose without nasal septal hypertrophy Mouth/Parynx benign; Mallinpatti scale 3 Neck: No JVD, no carotid bruits; normal carotid upstroke Lungs: clear to ausculatation and percussion; no wheezing or rales Chest wall: without tenderness to palpitation Heart: PMI not displaced, RRR, s1 s2 normal, 1/6 systolic murmur, no diastolic murmur, no rubs, gallops, thrills, or heaves Abdomen: soft, nontender; no hepatosplenomehaly, BS+; abdominal aorta nontender and not dilated by palpation. Back: Scoliosis Pulses 2+ Musculoskeletal: full range of motion, normal strength, no joint deformities Extremities: Fungal infection on her great toenail Neurologic: grossly nonfocal; Cranial nerves grossly wnl Psychologic: Normal mood and affect   ECG (independently read by me): NSR at 62; no ectopy; normal intervals   November 2019 ECG (independently read by me): Normal sinus rhythm with PAC.  Ventricular rate 66.  QS V1 V2.  Mild T wave abnormality  June 2019 ECG (independently read by me): Normal sinus rhythm at 64 bpm.  Normal intervals.  No ectopy.  September 2018 ECG (independently read by me): Normal sinus rhythm at 64 bpm.  Isolated PAC.  QTc interval 414 ms.  QS V1 V2, unchanged.  May 2018 ECG (independently read by me): Normal sinus rhythm at 65 bpm.  QS V1, V2.  Normal intervals.  No ST segment changes.  March 2018 ECG (independently read by me): Normal sinus rhythm at 65  bpm.  No ectopy.  Normal intervals.  September 2017 ECG (independently read by me): Normal sinus rhythm at 62 bpm.  QS V1 and V2.  Normal intervals.  April 2017 ECG (independently read by me): Normal sinus rhythm at 70 bpm.  No ectopy.  PR interval 158 ms and QTc interval 414 ms.  March 2017 ECG (independently read by me): normal sinus rhythm at 61 bpm..  No ST  segment changes.  Normal intervals.  QTc interval 396 ms.  August 2014 ECG (independently read by me): Normal sinus rhythm at 63 bpm.  QRS couplets V1 V2.  No cigarette ST-T changes.  May 2016 ECG (independently read by me): Sinus bradycardia 59 bpm.  No ectopy.  February 2016 ECG (independently read by me): Sinus bradycardia 56 bpm.  Normal intervals.  Prior ECG (independently read by me): Normal sinus rhythm at 62 beats per minute.  QTc interval 411 milliseconds.  QRS complex V1, V2.  Normal intervals.  ECG (independently read by me): Normal sinus rhythm at 62 beats per minute. Normal intervals. QTc interval 399 ms.  Prior ECG of 07/13/2013: Sinus rhythm at 61 beats per minute. QTc interval 406 ms. PR interval normal at 168 ms.  LABS: BMP Latest Ref Rng & Units 12/05/2017 07/02/2017 07/01/2017  Glucose 65 - 99 mg/dL 129(H) 88 102(H)  BUN 6 - 20 mg/dL 13 9 13   Creatinine 0.44 - 1.00 mg/dL 0.87 0.95 0.82  Sodium 135 - 145 mmol/L 136 139 134(L)  Potassium 3.5 - 5.1 mmol/L 3.8 3.8 3.6  Chloride 101 - 111 mmol/L 100(L) 109 101  CO2 22 - 32 mmol/L 26 24 24   Calcium 8.9 - 10.3 mg/dL 9.6 8.7(L) 9.3   Hepatic Function Latest Ref Rng & Units 12/05/2017 07/01/2017 06/30/2017  Total Protein 6.5 - 8.1 g/dL 7.6 7.2 8.2(H)  Albumin 3.5 - 5.0 g/dL 4.4 4.4 5.0  AST 15 - 41 U/L 30 28 40  ALT 14 - 54 U/L 18 16 17   Alk Phosphatase 38 - 126 U/L 55 40 46  Total Bilirubin 0.3 - 1.2 mg/dL 0.6 1.3(H) 1.4(H)  Bilirubin, Direct 0.1 - 0.5 mg/dL - 0.1 -   CBC Latest Ref Rng & Units 12/05/2017 07/02/2017 07/01/2017  WBC 4.0 - 10.5 K/uL 9.2 10.1 8.3  Hemoglobin 12.0 - 15.0 g/dL 12.0 11.2(L) 14.1  Hematocrit 36.0 - 46.0 % 36.0 32.9(L) 40.8  Platelets 150 - 400 K/uL 288 224 272   Lab Results  Component Value Date   TSH 1.07 12/03/2016  Lipid Panel     Component Value Date/Time   CHOL 154 12/03/2016 0845   TRIG 81 12/03/2016 0845   HDL 67 12/03/2016 0845   CHOLHDL 2.3 12/03/2016 0845   VLDL 16 12/03/2016 0845     LDLCALC 71 12/03/2016 0845     RADIOLOGY: No results found.  IMPRESSION:  No diagnosis found.  ASSESSMENT AND PLAN: Ms. Lantz is an 85 year old female who has a history of documented SVT and PACs/PVCs which have been controlled with beta blocker therapy. She has documented normal systolic function on her echo in March 2014 with grade 1 diastolic dysfunction and had  significant left atrial dilatation and mild dilatation of the right ventricle with moderate dilatation of the right atrium. She has mild/moderate pulmonary hypertension with PA pressures of 42 mm. Her palpitations have resolved on  Toprol-XL 75 mg twice a day.  She was found to have obstructive sleep apnea but could not tolerate CPAP therapy.  Remotely she  had seen Dr. Ron Parker and also had a mouthguard.  I had referred her back to see Dr. Ron Parker and apparently she had a new oral appliance.  She has had some difficulty with causing teeth discomfort from this device despite having Dr. Ron Parker reassess and make adjustments.   She continues to use a chinstrap on nights that she does not use the oral appliance.  Remotely she never could tolerate CPAP.  I previously had discussed alternatives such as inspire but I am not certain this would be optimal for her and she is not certain she wants to pursue this.  She apparently recently saw Dr. Asencion Partridge Dohmeier.  Apparently there may be an order for a subsequent home sleep study for reassessment of her sleep apnea per Dr. Brett Fairy.  Her blood pressure today is improved on her amlodipine 2.5 mg and metoprolol succinate 75 mg twice a day.  She is no longer having any palpitations.  She continues to have musculoskeletal-like chest pain most likely related to her chest wall deformity from her scoliosis creating muscle spasm.  She asked my opinion regarding initiation of terbinafine for her fungal infection.  She would prefer not to take this.  I discussed with her typically treatment would really require  long-term therapy.  There is a slight interaction with potential metoprolol.  She states that she wear shoes she is not walking barefoot and really would prefer not to initiate treatment.  I concurred with her assessment and discussed potential adjunctive therapy and foot soaking.  She has documented ejection fraction at 55 to 60% with grade 1 diastolic dysfunction, mitral annular calcification with mild MR and mild PA peak pressure at 34 mm on her last echo Doppler study of October 2018.  She prefers to see me at six-month intervals.  I will see her in 6-8 months for reevaluation.  Time spent: 25 minutes Troy Sine, MD, [facility]  03/15/2019 10:45 AM

## 2019-03-16 ENCOUNTER — Ambulatory Visit (INDEPENDENT_AMBULATORY_CARE_PROVIDER_SITE_OTHER): Payer: Medicare Other | Admitting: Neurology

## 2019-03-16 DIAGNOSIS — F40240 Claustrophobia: Secondary | ICD-10-CM

## 2019-03-16 DIAGNOSIS — G4701 Insomnia due to medical condition: Secondary | ICD-10-CM

## 2019-03-16 DIAGNOSIS — R0683 Snoring: Secondary | ICD-10-CM

## 2019-03-16 DIAGNOSIS — G4733 Obstructive sleep apnea (adult) (pediatric): Secondary | ICD-10-CM | POA: Diagnosis not present

## 2019-03-16 DIAGNOSIS — Z789 Other specified health status: Secondary | ICD-10-CM

## 2019-03-16 DIAGNOSIS — M41113 Juvenile idiopathic scoliosis, cervicothoracic region: Secondary | ICD-10-CM

## 2019-03-16 DIAGNOSIS — G8929 Other chronic pain: Secondary | ICD-10-CM

## 2019-03-16 DIAGNOSIS — I4892 Unspecified atrial flutter: Secondary | ICD-10-CM

## 2019-03-18 ENCOUNTER — Encounter: Payer: Self-pay | Admitting: Cardiovascular Disease

## 2019-03-29 ENCOUNTER — Telehealth: Payer: Self-pay | Admitting: Neurology

## 2019-03-29 NOTE — Telephone Encounter (Signed)
Pt called stating she has been waiting for a call back to know her results and to be advised on what the next step would be. Please advise.

## 2019-03-29 NOTE — Telephone Encounter (Signed)
Janet Nguyen with the sleep lab stated that the data was empty when they received the HST machine back. Janet Nguyen has set the patient up with another machine and will have the patient complete again and when Dr Dohmeier reads that study I will call the patient and inform of the results.

## 2019-04-07 ENCOUNTER — Telehealth: Payer: Self-pay

## 2019-04-07 ENCOUNTER — Telehealth: Payer: Self-pay | Admitting: Neurology

## 2019-04-07 NOTE — Telephone Encounter (Signed)
-----   Message from Larey Seat, MD sent at 04/07/2019  1:11 PM EDT ----- Cc Dr Harrington Challenger. Please forward results.   Summary & Diagnosis:   This HST confirmed the presence of obstructive sleep apnea at an  AHI of 26.5/h and REM AHI at 34.9/h. Snoring is not suggested by  the RDI /h. and there was no tachycardia, but intermittent  bradycardia. No clinically significant hypoxemia.   Recommendations:     This type and degree of apnea can be addressed with an INSPIRE  device, given the history of failed CPAP and dental device  therapy. I will be happy to make the referral once the patient agrees to this step.   Larey Seat, MD  04-07-2019

## 2019-04-07 NOTE — Telephone Encounter (Signed)
I ave read her study and recommended treatment for apnea.

## 2019-04-07 NOTE — Procedures (Signed)
Patient Information     First Name: Janet Last Name: Nguyen ID: [id number]  Birth Date: 13-Dec-1931 Age: 85 Gender: Female  Referring Provider: Dr. Harrington Challenger BMI: 21.5 (W=117 lb, H=5' 2'')  Neck Circ.:  12 '' Epworth:  5/24   Sleep Study Information    Study Date: Mar 29, 2019 S/H/A Version: 001.001.001.001 / 4.1.1528 / 14  History:     [name] is a 85 y.o. Female and former Patient of Dr. Leta Baptist and seen here upon referral by PCP Dr. Harrington Challenger for "Sleep apnea". She was for many years followed by Dr.Lewitt.    The patient had a sleep study 2013 at [facility], she took a medication to be able to sleep when she underwent testing. She was referred by and her PSG interpreted by Dr. Shelva Majestic, her cardiologist.  The results were supposingly positive for OSA, AHI was 10.2/h. but the patient has been claustrophobic and could never tolerate CPAP with any of the interfaces provided. She tried a dental device with Dr. Ron Parker with the same limited result.  She reports being in chronic pain, having DDD, scoliosis, muscle and joint aches.            Summary & Diagnosis:    This HST confirmed the presence of obstructive sleep apnea at an AHI of 26.5/h and REM AHI at 34.9/h. Snoring is not suggested by the RDI /h. and there was no tachycardia, but intermittent bradycardia. No clinically significant hypoxemia.   Recommendations:      This type and degree of apnea can be addressed with an INSPIRE device, given the history of failed CPAP and dental device therapy.   Larey Seat, MD   04-07-2019              Sleep Summary  Oxygen Saturation Statistics   Start Study Time: End Study Time: Total Recording Time:  11:11:09 PM 7:39:34 AM      8 h, 28 min  Total Sleep Time % REM of Sleep Time:  7 h, 39 min  25.4    Mean: 94 Minimum: 85 Maximum: 98  Mean of Desaturations Nadirs (%):   91  Oxygen Desaturation %:   4-9 10-20 >20 Total  Events Number Total    110  7 94.0 6.0  0 0.0  117 100.0  Oxygen Saturation: <90 <=88 <85 <80 <70  Duration (minutes): Sleep % 5.4 1.2  2.3 0.0  0.5 0.0 0.0 0.0 0.0 0.0     Respiratory Indices      Total Events REM NREM All Night  pRDI:  191  pAHI:  189 ODI:  117  pAHIc:  69  % CSR: 14.9 34.9 34.9 25.4 18.0 23.8 23.4 13.1 6.7 26.7 26.5 16.4 9.7       Pulse Rate Statistics during Sleep (BPM)      Mean:  59 Minimum: 40 Maximum: 81    Indices are calculated using technically valid sleep time of  7 hrs, 8 min. pRDI/pAHI are calculated using oxi desaturations ? 3%  Body Position Statistics  Position Supine Prone Right Left Non-Supine  Sleep (min) 404.9 0.0 55.0 0.0 55.0  Sleep % 88.0 0.0 12.0 0.0 12.0  pRDI 28.3 N/A 15.9 N/A 15.9  pAHI 28.0 N/A 15.9 N/A 15.9  ODI 18.5 N/A 1.1 N/A 1.1     Snoring Statistics Snoring Level (dB) >40 >50 >60 >70 >80 >Threshold (45)  Sleep (min) 191.6 5.9 2.0 0.3 0.0 15.8  Sleep %  41.7 1.3 0.4 0.1 0.0 3.4    Mean: 41 dB Sleep Stages Chart                                        pAHI=26.5                                                    Mild              Moderate                    Severe                                                 5              15                    30

## 2019-04-07 NOTE — Telephone Encounter (Signed)
Called the patient and reviewed with her the sleep study. Advised the patient of the sleep apnea findings and with her tried and failed CPAP and dental device. Dr Dohmeier would state we could send to ENT for referral to see if she would be a inspire candidate. The patient was very frustrated with [facility] and states that she feels like her optic migraines have not been addressed at all. Advised that the referral we had received basically was to address sleep apnea first and then migraines. Informed her that with the sleep study indicating Sleep apnea present, treating the apnea would be best in helping the migraines as well. The patient didn't like that answer. I have set the pt up to come for in office apt on thur next week to discuss this. Advised the patient to check in 12:30 for 1 pm check in.

## 2019-04-07 NOTE — Telephone Encounter (Signed)
Patient left me voicemail that she needs an appoint ment with Dr. Brett Fairy. She feels her optic migraines are getting worse. She said she could not reach anyone to help her get scheduled.

## 2019-04-07 NOTE — Telephone Encounter (Signed)
Patient just recently re did her HST. I will wait and have Dr Brett Fairy assess those results before calling the patient back in case we need to move forward with CPAP therapy. At this time I have no apt open to offer but will keep her on list to offer a sooner apt if something opens up. I will call patient as soon as I get sleep study results so we can determine plan of care.

## 2019-04-11 DIAGNOSIS — G4733 Obstructive sleep apnea (adult) (pediatric): Secondary | ICD-10-CM | POA: Diagnosis not present

## 2019-04-12 DIAGNOSIS — K219 Gastro-esophageal reflux disease without esophagitis: Secondary | ICD-10-CM | POA: Diagnosis not present

## 2019-04-12 DIAGNOSIS — R070 Pain in throat: Secondary | ICD-10-CM | POA: Diagnosis not present

## 2019-04-12 DIAGNOSIS — J310 Chronic rhinitis: Secondary | ICD-10-CM | POA: Diagnosis not present

## 2019-04-12 DIAGNOSIS — G4733 Obstructive sleep apnea (adult) (pediatric): Secondary | ICD-10-CM | POA: Diagnosis not present

## 2019-04-12 DIAGNOSIS — G43109 Migraine with aura, not intractable, without status migrainosus: Secondary | ICD-10-CM | POA: Diagnosis not present

## 2019-04-14 ENCOUNTER — Encounter: Payer: Self-pay | Admitting: Neurology

## 2019-04-14 ENCOUNTER — Ambulatory Visit (INDEPENDENT_AMBULATORY_CARE_PROVIDER_SITE_OTHER): Payer: Medicare Other | Admitting: Neurology

## 2019-04-14 ENCOUNTER — Other Ambulatory Visit: Payer: Self-pay

## 2019-04-14 VITALS — BP 128/72 | HR 64 | Temp 98.2°F | Ht 61.0 in | Wt 114.0 lb

## 2019-04-14 DIAGNOSIS — I493 Ventricular premature depolarization: Secondary | ICD-10-CM | POA: Diagnosis not present

## 2019-04-14 DIAGNOSIS — G4733 Obstructive sleep apnea (adult) (pediatric): Secondary | ICD-10-CM | POA: Diagnosis not present

## 2019-04-14 DIAGNOSIS — I4892 Unspecified atrial flutter: Secondary | ICD-10-CM

## 2019-04-14 DIAGNOSIS — K559 Vascular disorder of intestine, unspecified: Secondary | ICD-10-CM | POA: Insufficient documentation

## 2019-04-14 DIAGNOSIS — I272 Pulmonary hypertension, unspecified: Secondary | ICD-10-CM | POA: Diagnosis not present

## 2019-04-14 NOTE — Patient Instructions (Signed)

## 2019-04-14 NOTE — Progress Notes (Signed)
SLEEP MEDICINE CLINIC    Provider:  Larey Seat, MD  Primary Care Physician:  Lawerance Cruel, MD [place] [address]     Referring Provider: Lawerance Cruel, [place],  [place] [address]          Chief Complaint according to patient   Patient presents with:    . New Patient (Initial Visit)           HISTORY OF PRESENT ILLNESS:  Janet Nguyen is a 85 y.o. year old White or Caucasian female patient seen here as a referral on 04/14/2019 .   I have the pleasure of seeing CARLYN LEMKE today, a right-handed White or Caucasian female with a history of OSA-  sleep disorder.  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  The patient was prescribed CPAP after her 2013 study but she only could tolerate it for about a week before she had to discontinue she cannot tolerate pressure on her nostrils.  She then decided to undergo a dental device and that also did  have a lasting effect for 12 month but that led to her teeth shifting.  She is currently untreated.  She has begun wearing a chinstrap which has helped reduce snoring and may help reduce some apnea 2. She lives alone and has no witnesses to her sleep pattern..    The patient had the first sleep study in the year 2013  And I quoted the results of the 2013 study in my virtual visit note- consultation from 01-10-2019.     The patient made 2 attempts to use a HST by mail, none of which yielded any results- there were no data We finally resumed to the old fashioned way- she picked the device up and returned it to [place]. I was able to gather the following data:   Mrs. Encina was tested in an in lab sleep study in 2013 at the [facility]  heart and sleep center where she supposedly had an AHI also known as apnea hypopnea index of 10.2/h of sleep.  The home sleep test now confirmed the presence of obstructive sleep apnea but her AHI is 26.5 which is moderate and during REM her AHI rose to 34.9 /h.  Snoring was not recorded.  There was no tachycardia but intermittent actually bradycardia a slow heart rate.  No clinically significant hypoxemia was noted.    Based on this the patient could use an inspire device she has a history of failing CPAP but it may be worthwhile trying it was a different interface and she has also had temporary success with dental device therapy in the past. She used dental device for 12 month made to measure by Dr. Ron Parker.  She also tells me that she wakes up straight out of a dream many nights.  She uses a sleep number bed but her scoliosis has caused rib cage pain when she sets the bed on too much of an incline. She has a constant postnasal drip.deviated septum- making CPAP an uncomfortable option. Dr Erik Obey recommend Afrin. The Patient stated she was never considered for a nasal septoplasty.  She had severe nosebleeds in the 1970's and was reluctant to use decongestants for this reason. She has not entertained INSPIRE,  stating her age..   Review of Systems: Out of a complete 14 system review, the patient complains of only the following symptoms, and all other reviewed systems are negative.:    Always sore throat, raspy voice, ulcer in her throat - sees Dr Erik Obey.  Dry mouth and dry eyes.  Fatigue, sleepiness , snoring, fragmented sleep,.   How likely are you to doze in the following situations: 0 = not likely, 1 = slight chance, 2 = moderate chance, 3 = high chance   Sitting and Reading? Watching Television? Sitting inactive in a public place (theater or meeting)? As a passenger in a car for an hour without a break? Lying down in the afternoon when circumstances permit? Sitting and talking to someone?  Sitting quietly after lunch without alcohol? In a car, while stopped for a few minutes in traffic?   Total = 8/ 24 points     Social History   Socioeconomic History  . Marital status: Divorced    Spouse name: Not on file  . Number of children: 4  . Years of education: college   . Highest education level: Not on file  Occupational History  . Occupation: retired   Scientific laboratory technician  . Financial resource strain: Not on file  . Food insecurity    Worry: Not on file    Inability: Not on file  . Transportation needs    Medical: Not on file    Non-medical: Not on file  Tobacco Use  . Smoking status: Never Smoker  . Smokeless tobacco: Never Used  Substance and Sexual Activity  . Alcohol use: Yes    Alcohol/week: 1.0 standard drinks    Types: 1 Standard drinks or equivalent per week    Comment: drink ETOH socially  . Drug use: No  . Sexual activity: Not on file  Lifestyle  . Physical activity    Days per week: Not on file    Minutes per session: Not on file  . Stress: Not on file  Relationships  . Social Herbalist on phone: Not on file    Gets together: Not on file    Attends religious service: Not on file    Active member of club or organization: Not on file    Attends meetings of clubs or organizations: Not on file    Relationship status: Not on file  Other Topics Concern  . Not on file  Social History Narrative   Lives alone at home   Drinks tea but tries to drink decaf   Has 9 grandchildren     Family History  Problem Relation Age of Onset  . Breast cancer Mother   . Heart disease Father     Past Medical History:  Diagnosis Date  . Arrhythmia    History of SVT with documented PVC'S and  PAC'S  12/08/12 Nuc stress test normal LV EF 74%  Event Monitor  12/01/12-01/03/13  . Celiac disease    treated by Dr. [name]  . GERD (gastroesophageal reflux disease)   . Intervertebral disc stenosis of neural canal of cervical region   . Irregular heart beat  11/30/12   ECHO-EF 60-65%  . Osteoporosis   . Scoliosis   . Scoliosis   . Sleep apnea 10/02/11 [facility]   Sleep study AHI -total sleep 10.3/hr  64.0/ hr during REM sleep.RDI 22.8/hr during total sleep 64.0/hr during REM sleep The lowest O2 sat during Non-REM and REM sleep was 86% and 88%  respectively. 04/08/12 CPAP/BIPAP titration study [facility]    Past Surgical History:  Procedure Laterality Date  . APPENDECTOMY     ruptured at age 55 and had surgery  . CARDIAC CATHETERIZATION  01/27/06     Current Outpatient Medications on File Prior to Visit  Medication Sig Dispense Refill  . acetaminophen (TYLENOL) 500 MG tablet Take by mouth as needed.    . ALPRAZolam (XANAX) 0.25 MG tablet Take 0.25 mg by mouth daily as needed.     Marland Kitchen amLODipine (NORVASC) 2.5 MG tablet TAKE ONE TABLET BY MOUTH DAILY 90 tablet 3  . metoprolol succinate (TOPROL-XL) 50 MG 24 hr tablet TAKE 1 AND 1/2 TABLETS BY MOUTH 2 TIMES A DAY 270 tablet 3  . pantoprazole (PROTONIX) 20 MG tablet Take 20 mg by mouth daily.     . Polyvinyl Alcohol-Povidone (REFRESH OP) Apply to eye.    Marland Kitchen UNABLE TO FIND vitafusion womens gummies    . Wheat Dextrin (EQ FIBER POWDER PO) Take by mouth.    . fluticasone (FLONASE) 50 MCG/ACT nasal spray     . UNABLE TO FIND vitafusion hair skin and nail vitamins     No current facility-administered medications on file prior to visit.     Allergies  Allergen Reactions  . Gluten Meal     Unknown  . Codeine Nausea Only    Physical exam:  Today's Vitals   04/14/19 1309  BP: 128/72  Pulse: 64  Temp: 98.2 F (36.8 C)  Weight: 114 lb (51.7 kg)  Height: 5' 1"  (1.549 m)   Body mass index is 21.54 kg/m.   Wt Readings from Last 3 Encounters:  04/14/19 114 lb (51.7 kg)  03/15/19 114 lb 9.6 oz (52 kg)  07/30/18 116 lb 12.8 oz (53 kg)     Ht Readings from Last 3 Encounters:  04/14/19 5' 1"  (1.549 m)  03/15/19 5' 2"  (1.575 m)  07/30/18 5' 2"  (1.575  m)      General: The patient is awake, alert and appears not in acute distress. The patient is well groomed. Head: Normocephalic, atraumatic.  Neck is supple. Mallampati 4 - laquered tongue, red, dry mucousa. ,  neck circumference:13  inches . Nasal airflow barely patent.  Left carotid bruit.   Dental status: biological  Teeth.  Cardiovascular:  Regular rate and cardiac rhythm by pulse, Respiratory: Lungs are clear to auscultation.  Skin:  Without evidence of ankle edema, or rash. Trunk: scoliosis affects thoracic movement.   Neurologic exam : The patient is awake and alert, oriented to place and time.   Memory subjective described as intact.  Attention span & concentration ability appears normal.  Speech is fluent,  without  dysarthria, dysphonia or aphasia.  Mood and affect are appropriate.   Cranial nerves: no loss of smell or taste reported  Pupils are equal and briskly reactive to light. Funduscopic exam deferred..  Extraocular movements in vertical and horizontal planes were intact and without nystagmus. No Diplopia. Visual fields by finger perimetry are intact. Hearing was intact to soft voice and finger rubbing.    Facial sensation intact to fine touch.  Facial motor strength is symmetric and tongue and uvula move midline.  Neck ROM : rotation, tilt and flexion extension were normal for age and shoulder shrug was symmetrical.    Motor exam:  Symmetric bulk, tone and ROM.   Normal tone without cog wheeling, symmetric grip strength .     After spending a total  time of  25  minutes face to face and additional time for physical and neurologic examination, review of laboratory studies,  personal review of imaging studies, reports and results of other testing and review of referral information / records as far as provided in visit, I have established the following assessments:  1)  Nasal patency to be improved, let's try afrin.  2) once nasal patency is improved, I would  progress to a new attempt at CPAP, with nasal pillow or nasal cradle- she is clausrophobic.  I offered her to call me and set up a fitting in my sleep lab.  3) I see no other treatment option at this time. She doesn't need oxygen.    CC: I will share my notes with Dr Erik Obey, MD   Electronically signed by: Larey Seat, MD 04/14/2019 1:17 PM  [facility] Board certified by The [employer] of Sleep Medicine and Diplomate of the [employer] of Sleep Medicine. Board certified In Neurology through the [place], Fellow of the [employer] of Neurology. Medical Director of [facility].

## 2019-04-27 DIAGNOSIS — B351 Tinea unguium: Secondary | ICD-10-CM | POA: Diagnosis not present

## 2019-04-27 DIAGNOSIS — M19049 Primary osteoarthritis, unspecified hand: Secondary | ICD-10-CM | POA: Diagnosis not present

## 2019-04-27 DIAGNOSIS — I868 Varicose veins of other specified sites: Secondary | ICD-10-CM | POA: Diagnosis not present

## 2019-05-17 DIAGNOSIS — H40013 Open angle with borderline findings, low risk, bilateral: Secondary | ICD-10-CM | POA: Diagnosis not present

## 2019-05-17 DIAGNOSIS — H04123 Dry eye syndrome of bilateral lacrimal glands: Secondary | ICD-10-CM | POA: Diagnosis not present

## 2019-05-24 DIAGNOSIS — M25519 Pain in unspecified shoulder: Secondary | ICD-10-CM | POA: Diagnosis not present

## 2019-05-24 DIAGNOSIS — Z0000 Encounter for general adult medical examination without abnormal findings: Secondary | ICD-10-CM | POA: Diagnosis not present

## 2019-05-24 DIAGNOSIS — R109 Unspecified abdominal pain: Secondary | ICD-10-CM | POA: Diagnosis not present

## 2019-05-24 DIAGNOSIS — Z13220 Encounter for screening for lipoid disorders: Secondary | ICD-10-CM | POA: Diagnosis not present

## 2019-05-24 DIAGNOSIS — Z23 Encounter for immunization: Secondary | ICD-10-CM | POA: Diagnosis not present

## 2019-05-24 DIAGNOSIS — F419 Anxiety disorder, unspecified: Secondary | ICD-10-CM | POA: Diagnosis not present

## 2019-05-24 DIAGNOSIS — G479 Sleep disorder, unspecified: Secondary | ICD-10-CM | POA: Diagnosis not present

## 2019-05-24 DIAGNOSIS — I10 Essential (primary) hypertension: Secondary | ICD-10-CM | POA: Diagnosis not present

## 2019-05-24 DIAGNOSIS — B353 Tinea pedis: Secondary | ICD-10-CM | POA: Diagnosis not present

## 2019-05-24 DIAGNOSIS — B370 Candidal stomatitis: Secondary | ICD-10-CM | POA: Diagnosis not present

## 2019-05-24 DIAGNOSIS — R079 Chest pain, unspecified: Secondary | ICD-10-CM | POA: Diagnosis not present

## 2019-06-01 DIAGNOSIS — M419 Scoliosis, unspecified: Secondary | ICD-10-CM | POA: Diagnosis not present

## 2019-06-01 DIAGNOSIS — R1011 Right upper quadrant pain: Secondary | ICD-10-CM | POA: Diagnosis not present

## 2019-06-01 DIAGNOSIS — R159 Full incontinence of feces: Secondary | ICD-10-CM | POA: Diagnosis not present

## 2019-06-08 ENCOUNTER — Encounter: Payer: Self-pay | Admitting: Podiatry

## 2019-06-08 ENCOUNTER — Ambulatory Visit (INDEPENDENT_AMBULATORY_CARE_PROVIDER_SITE_OTHER): Payer: Medicare Other | Admitting: Podiatry

## 2019-06-08 ENCOUNTER — Other Ambulatory Visit: Payer: Self-pay

## 2019-06-08 DIAGNOSIS — B351 Tinea unguium: Secondary | ICD-10-CM | POA: Diagnosis not present

## 2019-06-08 DIAGNOSIS — L600 Ingrowing nail: Secondary | ICD-10-CM | POA: Diagnosis not present

## 2019-06-08 MED ORDER — NEOMYCIN-POLYMYXIN-HC 3.5-10000-1 OT SOLN: OTIC | 0 refills | Status: DC

## 2019-06-08 NOTE — Patient Instructions (Signed)

## 2019-06-09 ENCOUNTER — Telehealth: Payer: Self-pay | Admitting: Podiatry

## 2019-06-09 ENCOUNTER — Telehealth: Payer: Self-pay | Admitting: *Deleted

## 2019-06-09 NOTE — Telephone Encounter (Signed)
I answered pt's concern about the drops and change to neosporin ointment through call taken by L. McGee.

## 2019-06-09 NOTE — Telephone Encounter (Signed)
Pt was seen in office yesterday and had a toenail removed. Pt was prescribed medicated drops but is unable to purchase the drops due to the cost. Pt wanted to know if there was anything else she could use.   I spoke with our triage nurse and informed the patient that she should continue the soaks as instructed and could use neosporin on the area and cover with a fabric bandage.

## 2019-06-09 NOTE — Telephone Encounter (Signed)
Pt states both Dr. Paulla Dolly and Allena Katz were in the room she told them her medication should go to [facility] on [address], but when she got home there was a call from [place] with her prescription. Pt states not only that but the prescription is too expensive.

## 2019-06-10 ENCOUNTER — Telehealth: Payer: Self-pay | Admitting: Podiatry

## 2019-06-10 NOTE — Telephone Encounter (Signed)
Pt called and stated her toe is still swollen, please call patient

## 2019-06-10 NOTE — Telephone Encounter (Signed)
I called pt and she states is having swelling and is still using an antifungal on the remaining nail. I told pt 1st to stop the antifungal until the area has healed, continue the soaking and the neosporin ointment with the fabric bandaid. Pt states no one told her any of this at her appt. I told pt that she would have redness, swelling, oozing and discomfort on and off to varying degrees for about 4-6 weeks and to continue the soaks and dressings at least 2 times day for about 4 weeks until she started seeing a decrease in the redness, swelling and drainage, once the area had a dry hard scab without redness, drainage, swelling or discomfort she could stop the soaking and medication. Pt states understanding.

## 2019-06-11 NOTE — Progress Notes (Signed)
Subjective:   Patient ID: Janet Nguyen, female   DOB: 85 y.o.   MRN: [id number]   HPI Patient presents with a thickened damaged right big toe nail that is gotten loose and is moderately painful and is about ready to come off and she needs to have it removed.  Patient does not smoke likes to be active   Review of Systems  All other systems reviewed and are negative.       Objective:  Physical Exam Vitals signs and nursing note reviewed.  Constitutional:      Appearance: She is well-developed.  Pulmonary:     Effort: Pulmonary effort is normal.  Musculoskeletal: Normal range of motion.  Skin:    General: Skin is warm.  Neurological:     Mental Status: She is alert.     Neurovascular status intact muscle strength found to be adequate range of motion within normal limits with patient noted to have a thickened right hallux nail that is dystrophic and moderately tender and is partially loose from the nail bed     Assessment:  Chronic nail disease right hallux with damage nail plate right     Plan:  H&P condition reviewed recommended nail removal allowing new nail to regrow.  I infiltrated the right hallux 60 mg like a Marcaine mixture sterile prep applied using sterile instrumentation remove the nail cleaned out the bed applied sterile dressing and reappoint for routine care and check

## 2019-06-16 DIAGNOSIS — G4733 Obstructive sleep apnea (adult) (pediatric): Secondary | ICD-10-CM | POA: Diagnosis not present

## 2019-06-16 DIAGNOSIS — R42 Dizziness and giddiness: Secondary | ICD-10-CM | POA: Diagnosis not present

## 2019-06-16 DIAGNOSIS — G43109 Migraine with aura, not intractable, without status migrainosus: Secondary | ICD-10-CM | POA: Diagnosis not present

## 2019-06-17 DIAGNOSIS — R0982 Postnasal drip: Secondary | ICD-10-CM | POA: Diagnosis not present

## 2019-06-17 DIAGNOSIS — R0789 Other chest pain: Secondary | ICD-10-CM | POA: Diagnosis not present

## 2019-06-17 DIAGNOSIS — R9431 Abnormal electrocardiogram [ECG] [EKG]: Secondary | ICD-10-CM | POA: Diagnosis not present

## 2019-06-22 DIAGNOSIS — G479 Sleep disorder, unspecified: Secondary | ICD-10-CM | POA: Diagnosis not present

## 2019-06-22 DIAGNOSIS — R0982 Postnasal drip: Secondary | ICD-10-CM | POA: Diagnosis not present

## 2019-06-22 DIAGNOSIS — R0981 Nasal congestion: Secondary | ICD-10-CM | POA: Diagnosis not present

## 2019-06-22 DIAGNOSIS — K1379 Other lesions of oral mucosa: Secondary | ICD-10-CM | POA: Diagnosis not present

## 2019-06-22 DIAGNOSIS — K219 Gastro-esophageal reflux disease without esophagitis: Secondary | ICD-10-CM | POA: Diagnosis not present

## 2019-06-22 DIAGNOSIS — R51 Headache: Secondary | ICD-10-CM | POA: Diagnosis not present

## 2019-06-22 DIAGNOSIS — G43909 Migraine, unspecified, not intractable, without status migrainosus: Secondary | ICD-10-CM | POA: Diagnosis not present

## 2019-06-29 DIAGNOSIS — R300 Dysuria: Secondary | ICD-10-CM | POA: Diagnosis not present

## 2019-06-29 DIAGNOSIS — K219 Gastro-esophageal reflux disease without esophagitis: Secondary | ICD-10-CM | POA: Diagnosis not present

## 2019-06-29 DIAGNOSIS — R682 Dry mouth, unspecified: Secondary | ICD-10-CM | POA: Diagnosis not present

## 2019-07-04 ENCOUNTER — Telehealth: Payer: Self-pay | Admitting: Cardiovascular Disease

## 2019-07-04 NOTE — Telephone Encounter (Signed)
Spoke with pt who states her call to [facility] was because she received an alert advising her to call to schedule an appt. She states she has Hx sleep apnea that Dr. [name] is aware of and has been having problems with sleeping and that within the last month or so she has woken up several times with 'excessive pains'. No active CP but this is concerning for her and she would like an appt with Dr. [name] sooner than December; October if possible. Attempted to find available appt slot for Oct. Informed pt that triage nurse unable to find slot. Offered pt appt with an APP. Pt would prefer to see Dr. [name]. Informed her that Dr. [name] nurse not in the office today but triage nurse would route message to Dr. [name] nurse regarding appt availability for October. Pt agreeable with this.

## 2019-07-04 NOTE — Telephone Encounter (Signed)
New message   Pt c/o of Chest Pain: STAT if CP now or developed within 24 hours  1. Are you having CP right now? No   2. Are you experiencing any other symptoms (ex. SOB, nausea, vomiting, sweating)? No   3. How long have you been experiencing CP?per patient past few nights   4. Is your CP continuous or coming and going? Coming and going   5. Have you taken Nitroglycerin? No

## 2019-07-05 NOTE — Telephone Encounter (Signed)
Left message for patient to call and schedule 3:00pm appointment this Thursday 07/07/19 with Dr. [name] per note

## 2019-07-05 NOTE — Telephone Encounter (Signed)
Please offer appointment for Thursday this week at 3:00  Thank you!

## 2019-07-07 ENCOUNTER — Ambulatory Visit (INDEPENDENT_AMBULATORY_CARE_PROVIDER_SITE_OTHER): Payer: Medicare Other | Admitting: Cardiovascular Disease

## 2019-07-07 ENCOUNTER — Other Ambulatory Visit: Payer: Self-pay

## 2019-07-07 ENCOUNTER — Encounter: Payer: Self-pay | Admitting: Cardiovascular Disease

## 2019-07-07 VITALS — BP 161/90 | HR 63 | Ht 61.0 in | Wt 116.0 lb

## 2019-07-07 DIAGNOSIS — I471 Supraventricular tachycardia: Secondary | ICD-10-CM | POA: Diagnosis not present

## 2019-07-07 DIAGNOSIS — M4125 Other idiopathic scoliosis, thoracolumbar region: Secondary | ICD-10-CM | POA: Diagnosis not present

## 2019-07-07 DIAGNOSIS — G43109 Migraine with aura, not intractable, without status migrainosus: Secondary | ICD-10-CM | POA: Diagnosis not present

## 2019-07-07 DIAGNOSIS — G4733 Obstructive sleep apnea (adult) (pediatric): Secondary | ICD-10-CM | POA: Diagnosis not present

## 2019-07-07 DIAGNOSIS — I10 Essential (primary) hypertension: Secondary | ICD-10-CM

## 2019-07-07 DIAGNOSIS — I340 Nonrheumatic mitral (valve) insufficiency: Secondary | ICD-10-CM

## 2019-07-07 NOTE — Progress Notes (Signed)
50,000 patient ID: [name], female   DOB: 27-Nov-1931, 85 y.o.   MRN: [id number]     Primary  M.D.: Dr. Mardene Sayer  HPI: Janet Nguyen is a 85 y.o. female who presents for a 4 month followup cardiology evaluation.   Janet Nguyen has a history of documented SVT and also has PACs and PVCs  treated with beta blocker therapy. In April 2014 I further titrated her beta blocker therapy after cardiac event monitor revealed several bursts of recurrent SVT up to 177 beats per minute in March.  In July after she had had 2 episodes of chest fluttering which each lasted over 30 minutes which he did take metoprolol tartrate with relief I recommended further titration of her Toprol to 75 mg in the morning and 50 mg at night and if necessary she could further titrate this to 75 twice a day.  She has a history of obstructive sleep apnea but despite multiple attempts at CPAP utilization she has not been able to tolerate this. She was referred to Dr. Alanson Nguyen and has a customized dental appliance with mandibular advancement with improvement in some of her symptomatology. Due concern that her teeth may be moving in more recently she has has not been using customized appliance daily but has been using her old non-customized mouthguard.  At times shen has awakened abruptly from a dream with her heart pounding and a sensation of hot flashes, gasping for breath.   She also states that her scoliosis is getting worse. She does have left-sided musculoskeletal type chest pain due to her spine angulation.  Beause of her significant scoliosis, she feels she must sleep on her back.   She has a history of GERD for which she has been taking Nexium.  She presents for evaluation.  I had scheduled her for an overnight oximetry to see if she is a candidate for supplemental oxygen at nighttime since she refused to use CPAP and only very rarely uses her customized mouthpiece.  She does wear a mouthguard to reduce  bruxism.  Oximetry study was performed overnight on February 23/24.  Her mean oxygen saturation was 93.12%.  She spent 12 minutes and 16 seconds with O2 sat duration below 88% with the lowest O2 sat duration of 80%. I tried to set her up for supplemental oxygen at bedtime.  However, she has been denied for this on multiple occasions by her insurance/Medicare.  She feels that she is sleeping better.  She can only sleep in her back due to scoliosis.  Her left side is concave; her right side is convex.  She has had issues with labile blood pressure. She has had issues with lower back discomfort and sciatica leading to emergency room evaluation in November 2016.  She saw Dr. Rita Nguyen for neurosurgical evaluation. She has migraine headaches. She  Recently saw Dr. Lennie Nguyen for these migraine headaches.  She has been on Toprol-XL 75 mg twice a day for her palpitations and presently denies any awareness an extra heartbeats. She takes Xanax on an as-needed basis for anxiety.  She has been taking Nexium 20, no grams every other day for GERD. She states her scoliosis is getting worse.  Has been experiencing more fatigue.  She also notes some occasional left hand numbness.  In a mouth guard but no ureteral longer uses her mandibular advancement device and not tolerate CPAP therapy for her obstructive sleep apnea.    She had experienced left-sided chest discomfort which most likely  was related to her significant scoliosis the potential neuropathy causing intermittent left arm and hand numbness.  I slightly reduced her Toprol-XL which she had been taking 150 mg daily and a 75 twice a day regimen to 75 and milligrams in the morning and 50 mg with ultimate plan to decrease this to 50 twice a day.  She states when she reduce this, she began to notice more optical migraines and resume taking the higher dose Toprol at 75 twice a day with improvement.  She has seen Dr. Lennie Nguyen for her paresthesias.  She admits to significant anxiety.   She had requested zolpidem for sleep initiation and maintenance.  In the past, we had tried 6.25 slow-release version to help with sleep maintenance, but due to cost issues preferred the 5 mg sleep initiation dose.  She experiences optical migraine headaches.  She was being seen by neurologist, Dr. Melton Nguyen  She continues to have issues with her scoliosis causing discomfort in her neck and arm due to her distortion.  He tells me she underwent endoscopy and colonoscopy as well as banding of hemorrhoids.  She had a benign polyp removed.  She has issues with spastic colon.  Her sleep is better with zolpiden 5 mg.  She saw Kerin Janet Nguyen on 11/13/2016.  She had noticed that her hair was "thinning" and was concerned about this being the result of Toprol.  He suggested possibly decreasing Toprol but she preferred not until she had seen me.  Since that time, she continues to experience some optical migraines.  She has noticed some leg left neck discomfort.  She denies any patches of hair loss.  She denies differential arm weakness.  She continues to have difficulty with her scoliosis with hip pain and rib cage discomfort.   When I saw her in March 2018, there was a blood pressure differential of 118/78 in the left arm and 140/80 in the right arm.  She subsequently underwent upper extremity Doppler evaluation on 12/25/2016.  She was noted have mild heterogeneous plaque with stable 1-39% bilateral internal carotid stenoses.  She had normal subclavian arteries bilaterally.  There are patent vertebral arteries with antegrade flow.  She did not have any significant blood pressure differential and had triphasic waveforms in both her right and biphasic in her left brachial artery.  Left blood pressure was 8 mm higher than the right brachial pressure.  At times, she continues to experience some vague chest wall symptoms, which most likely related to her posture.  She denies significant shortness of breath.  She has to sleep on  her back because of her scoliosis.  Uses a chin strap to prevent oral breathing.  She is not on CPAP.   In June 2019 she had concerns about some of her medications possibly causing hair loss or memory loss.  She admits to having dry eyes.  She continues to have difficulty from her scolWsaw her in November 2019 at which time she,  felt well from a cardiac standphk and apparently had a new oral appliance made.  She used this initially but then started to notice some teeth discomfort.  Apparently this again was treated by Dr.e still admits to being tired.  She has issues with her abdomen being tight which she believes is related to her scoliosis.  Her GERD is controlled with pantoprazole.    She was seen by Dr. Mardene Sayer for her primary care.  She had developed a fungal infection of her toenail.  He had  given her to [place] fine 20 mg.  She has not started this and was hesitant to do this due to potential interaction with metoprolol.  She also was evaluated by Dr. Brett Nguyen.  According to Janet Nguyen she did not evaluate her ophthalmic migraines.  However she was planning to do a possible home study to reassess her sleep apnea.  In the past she did not tolerate any CPAP therapy and although initially tolerated customized oral appliance this seemed to cause difficulty with movement of her teeth.  She has been using the CALM app on her smart phone to help with relaxation and improve sleep and is chronically dependent on low-dose zolpidem prescribed by Dr. Harrington Challenger.  She denies chest pain.  Her palpitations are well controlled with her current dose of metoprolol 75 mils twice a day and she can to be on low-dose amlodipine 2.5 mg.    Since I last saw her in June 2020, she underwent the home study by Dr. Theodoro Clock was done after several studies with no data.  According to Dr. Edwena Felty note, AHI was 26.5 which was moderate and during REM sleep AHI rose to 34.9.  Snoring was not recorded.  Apparently there was  discussion of possible inspire technology.  Janet Nguyen is not interested.  Presently, she has had continued issues with migraines and some vertigo for which he takes the Epley maneuver.  She continues to have discomfort related to her scoliosis with muscle discomfort on her chest.  She believes she is waking up during dreaming and not sleeping well.  Her blood pressure has increased.  She presents for evaluation.  Past Medical History:  Diagnosis Date  . Arrhythmia    History of SVT with documented PVC'S and  PAC'S  12/08/12 Nuc stress test normal LV EF 74%  Event Monitor  12/01/12-01/03/13  . Celiac disease    treated by Dr. [name]  . GERD (gastroesophageal reflux disease)   . Intervertebral disc stenosis of neural canal of cervical region   . Irregular heart beat 11/30/12   ECHO-EF 60-65%  . Osteoporosis   . Scoliosis   . Scoliosis   . Sleep apnea 10/02/11 [facility]   Sleep study AHI -total sleep 10.3/hr  64.0/ hr during REM sleep.RDI 22.8/hr during total sleep 64.0/hr during REM sleep The lowest O2 sat during Non-REM and REM sleep was 86% and 88% respectively. 04/08/12 CPAP/BIPAP titration study [facility]    Past Surgical History:  Procedure Laterality Date  . APPENDECTOMY     ruptured at age 42 and had surgery  . CARDIAC CATHETERIZATION  01/27/06    Allergies  Allergen Reactions  . Gluten Meal     Unknown  . Codeine Nausea Only    Current Outpatient Medications  Medication Sig Dispense Refill  . acetaminophen (TYLENOL) 500 MG tablet Take by mouth as needed.    . ALPRAZolam (XANAX) 0.25 MG tablet Take 0.25 mg by mouth daily as needed.     . Biotin 5000 MCG TABS Take 1 tablet by mouth daily.    . Calcium 500-100 MG-UNIT CHEW Chew 1-2 tablets by mouth daily. vitafusion    . metoprolol succinate (TOPROL-XL) 50 MG 24 hr tablet TAKE 1 AND 1/2 TABLETS BY MOUTH 2 TIMES A DAY 270 tablet 3  . Multiple Vitamins-Minerals (PRESERVISION AREDS 2 PO)  Take 1 tablet by mouth every other day.    Janet Nguyen 3-6-9 Fatty Acids (OMEGA-3 FUSION PO) Take 1-2 tablets by mouth  daily. vitafusion    . pantoprazole (PROTONIX) 20 MG tablet Take 20 mg by mouth daily.     . Polyvinyl Alcohol-Povidone (REFRESH OP) Apply to eye.    Marland Kitchen amLODipine (NORVASC) 5 MG tablet Take 1 tablet (5 mg total) by mouth daily. 90 tablet 2   No current facility-administered medications for this visit.     Socially she is divorced has 4 children 9 grandchildren. She does exercise. No tobacco use. She does occasional wine.  ROS General: Negative; No fevers, chills, or night sweats;  HEENT: Negative; No changes in vision or hearing, sinus congestion, difficulty swallowing Pulmonary: Negative; No cough, wheezing, shortness of breath, hemoptysis Cardiovascular: Positive for occasional chest wall pain and nocturnal palpitations GI: Negative; No nausea, vomiting, diarrhea, or abdominal pain GU: Negative; No dysuria, hematuria, or difficulty voiding Musculoskeletal: Positive for significant scoliosis; fibromyalgia;  joint pain, or weakness Hematologic/Oncology: Negative; no easy bruising, bleeding Endocrine: Negative; no heat/cold intolerance; no diabetes Neuro: Negative; no changes in balance, headaches Skin: Positive for fungal infection of her toenail Psychiatric: Negative; No behavioral problems, depression Sleep: Positive for sleep apnea ; No snoring, daytime sleepiness, hypersomnolence, bruxism, restless legs, hypnogognic hallucinations, no cataplexy Other comprehensive 14 point system review is negative.  PE BP (!) 161/90   Pulse 63   Ht _0  (1.549 m)   Wt 116 lb (52.6 kg)   SpO2 98%   BMI 21.92 kg/m    Repeat blood pressure by me was 140/88  Wt Readings from Last 3 Encounters:  07/07/19 116 lb (52.6 kg)  04/14/19 114 lb (51.7 kg)  03/15/19 114 lb 9.6 oz (52 kg)   General: Alert, oriented, no distress.  Skin: normal turgor, no rashes, warm and dry HEENT:  Normocephalic, atraumatic. Pupils equal round and reactive to light; sclera anicteric; extraocular muscles intact; Nose without nasal septal hypertrophy Mouth/Parynx benign; Mallinpatti scale 3 Neck: No JVD, no carotid bruits; normal carotid upstroke Lungs: clear to ausculatation and percussion; no wheezing or rales Chest wall: without tenderness to palpitation Heart: PMI not displaced, RRR, s1 s2 normal, 2/6 systolic murmur at apex, no diastolic murmur, no rubs, gallops, thrills, or heaves Abdomen: soft, nontender; no hepatosplenomehaly, BS+; abdominal aorta nontender and not dilated by palpation. Back: Kyphoscoliosis; no CVA tenderness Pulses 2+ Musculoskeletal: full range of motion, normal strength, no joint deformities Extremities: no clubbing cyanosis or edema, Homan's sign negative  Neurologic: grossly nonfocal; Cranial nerves grossly wnl Psychologic: Normal mood and affect    ECG (independently read by me): Normal sinus rhythm at 63 bpm.  QS complex V1 V2.  No ectopy.  Normal intervals.  June 2020 ECG (independently read by me): NSR at 62; no ectopy; normal intervals   November 2019 ECG (independently read by me): Normal sinus rhythm with PAC.  Ventricular rate 66.  QS V1 V2.  Mild T wave abnormality  June 2019 ECG (independently read by me): Normal sinus rhythm at 64 bpm.  Normal intervals.  No ectopy.  September 2018 ECG (independently read by me): Normal sinus rhythm at 64 bpm.  Isolated PAC.  QTc interval 414 ms.  QS V1 V2, unchanged.  May 2018 ECG (independently read by me): Normal sinus rhythm at 65 bpm.  QS V1, V2.  Normal intervals.  No ST segment changes.  March 2018 ECG (independently read by me): Normal sinus rhythm at 65 bpm.  No ectopy.  Normal intervals.  September 2017 ECG (independently read by me): Normal sinus rhythm at 62 bpm.  QS V1  and V2.  Normal intervals.  April 2017 ECG (independently read by me): Normal sinus rhythm at 70 bpm.  No ectopy.  PR interval  158 ms and QTc interval 414 ms.  March 2017 ECG (independently read by me): normal sinus rhythm at 61 bpm..  No ST segment changes.  Normal intervals.  QTc interval 396 ms.  August 2014 ECG (independently read by me): Normal sinus rhythm at 63 bpm.  QRS couplets V1 V2.  No cigarette ST-T changes.  May 2016 ECG (independently read by me): Sinus bradycardia 59 bpm.  No ectopy.  February 2016 ECG (independently read by me): Sinus bradycardia 56 bpm.  Normal intervals.  Prior ECG (independently read by me): Normal sinus rhythm at 62 beats per minute.  QTc interval 411 milliseconds.  QRS complex V1, V2.  Normal intervals.  ECG (independently read by me): Normal sinus rhythm at 62 beats per minute. Normal intervals. QTc interval 399 ms.  Prior ECG of 07/13/2013: Sinus rhythm at 61 beats per minute. QTc interval 406 ms. PR interval normal at 168 ms.  LABS: BMP Latest Ref Rng & Units 12/05/2017 07/02/2017 07/01/2017  Glucose 65 - 99 mg/dL 129(H) 88 102(H)  BUN 6 - 20 mg/dL _0 Creatinine 0.44 - 1.00 mg/dL 0.87 0.95 0.82  Sodium 135 - 145 mmol/L 136 139 134(L)  Potassium 3.5 - 5.1 mmol/L 3.8 3.8 3.6  Chloride 101 - 111 mmol/L 100(L) 109 101  CO2 22 - 32 mmol/L _1 Calcium 8.9 - 10.3 mg/dL 9.6 8.7(L) 9.3   Hepatic Function Latest Ref Rng & Units 12/05/2017 07/01/2017 06/30/2017  Total Protein 6.5 - 8.1 g/dL 7.6 7.2 8.2(H)  Albumin 3.5 - 5.0 g/dL 4.4 4.4 5.0  AST 15 - 41 U/L 30 28 40  ALT 14 - 54 U/L _2 Alk Phosphatase 38 - 126 U/L 55 40 46  Total Bilirubin 0.3 - 1.2 mg/dL 0.6 1.3(H) 1.4(H)  Bilirubin, Direct 0.1 - 0.5 mg/dL - 0.1 -   CBC Latest Ref Rng & Units 12/05/2017 07/02/2017 07/01/2017  WBC 4.0 - 10.5 K/uL 9.2 10.1 8.3  Hemoglobin 12.0 - 15.0 g/dL 12.0 11.2(L) 14.1  Hematocrit 36.0 - 46.0 % 36.0 32.9(L) 40.8  Platelets 150 - 400 K/uL 288 224 272   Lab Results  Component Value Date   TSH 1.07 12/03/2016  Lipid Panel     Component Value Date/Time   CHOL 154 12/03/2016  0845   TRIG 81 12/03/2016 0845   HDL 67 12/03/2016 0845   CHOLHDL 2.3 12/03/2016 0845   VLDL 16 12/03/2016 0845   LDLCALC 71 12/03/2016 0845     RADIOLOGY: No results found.  IMPRESSION:  1. Migraine with aura and without status migrainosus, not intractable   2. PSVT (paroxysmal supraventricular tachycardia) ([place])   3. Essential hypertension, benign   4. OSA (obstructive sleep apnea)   5. Mild mitral regurgitation   6. Other idiopathic scoliosis, thoracolumbar region     ASSESSMENT AND PLAN: Janet Nguyen is an 85 year old female who has a history of documented SVT and PACs/PVCs which have been controlled with beta blocker therapy. She has documented normal systolic function on her echo in March 2014 with grade 1 diastolic dysfunction and had  significant left atrial dilatation and mild dilatation of the right ventricle with moderate dilatation of the right atrium. She has mild/moderate pulmonary hypertension with PA pressures of 42 mm. Her palpitations have resolved on  Toprol-XL 75 mg twice  a day.  She was found to have obstructive sleep apnea but could not tolerate CPAP therapy.  She subsequently was seen by Dr. Ron Nguyen and apparently did not tolerate oral appliance.  She subsequently had repeat evaluation with Dr. Brett Nguyen which apparently confirmed moderate sleep apnea.  The patient felt that other than inspire which was recommended no other treatment modality was discussed.  Apparently she does have a deviated septum and there was some issue concerning possible Afrin use and she never consider nasal septoplasty.  Her blood pressure today is elevated.  When I last saw her I had instituted low-dose amlodipine at 2.5 mg and I am now recommending she increase this to 5 mg daily.  She will continue with her Toprol-XL 75 mg twice a day.  Her ECG remained stable.  She is not having any episodes of SVT or palpitations.  She continues to be bothered by her scoliosis and I suspect she may benefit  potentially from some muscle relaxant since this may be causing some of her chest wall discomfort.  I will defer this to her primary physician Dr. Lawerance Cruel.  She continues to be on pantoprazole for GERD.  I will see her in 6 months for reevaluation or sooner problems arise.    Time spent: 25 minutes Troy Sine, MD, [facility]  07/09/2019 4:03 PM

## 2019-07-07 NOTE — Patient Instructions (Signed)
Medication Instructions:  INCREASE AMLODIPINE 5MG DAILY  If you need a refill on your cardiac medications before your next appointment, please call your pharmacy.  Follow-Up: You will need a follow up appointment in 6 months.  Please call our office 2 months in advance to schedule this appointment.  You may see Shelva Majestic, MD or one of the following Advanced Practice Providers on your designated Care Team:  Almyra Deforest, PA-C  Fabian Sharp, PA-C     At [facility], you and your health needs are our priority.  As part of our continuing mission to provide you with exceptional heart care, we have created designated Provider Care Teams.  These Care Teams include your primary Cardiologist (physician) and Advanced Practice Providers (APPs -  Physician Assistants and Nurse Practitioners) who all work together to provide you with the care you need, when you need it.  Thank you for choosing [facility] at [facility]!!

## 2019-07-08 ENCOUNTER — Other Ambulatory Visit: Payer: Self-pay | Admitting: Cardiovascular Disease

## 2019-07-09 ENCOUNTER — Encounter: Payer: Self-pay | Admitting: Cardiovascular Disease

## 2019-07-20 DIAGNOSIS — Z6821 Body mass index (BMI) 21.0-21.9, adult: Secondary | ICD-10-CM | POA: Diagnosis not present

## 2019-07-20 DIAGNOSIS — Z124 Encounter for screening for malignant neoplasm of cervix: Secondary | ICD-10-CM | POA: Diagnosis not present

## 2019-07-20 DIAGNOSIS — N760 Acute vaginitis: Secondary | ICD-10-CM | POA: Diagnosis not present

## 2019-07-20 DIAGNOSIS — Z01419 Encounter for gynecological examination (general) (routine) without abnormal findings: Secondary | ICD-10-CM | POA: Diagnosis not present

## 2019-08-05 DIAGNOSIS — S161XXA Strain of muscle, fascia and tendon at neck level, initial encounter: Secondary | ICD-10-CM | POA: Diagnosis not present

## 2019-08-05 DIAGNOSIS — G43109 Migraine with aura, not intractable, without status migrainosus: Secondary | ICD-10-CM | POA: Diagnosis not present

## 2019-08-08 ENCOUNTER — Other Ambulatory Visit: Payer: Self-pay | Admitting: Cardiovascular Disease

## 2019-08-09 DIAGNOSIS — M542 Cervicalgia: Secondary | ICD-10-CM | POA: Diagnosis not present

## 2019-08-10 ENCOUNTER — Telehealth: Payer: Self-pay | Admitting: Neurology

## 2019-08-10 NOTE — Telephone Encounter (Signed)
Yes, I have encouraged that . CD

## 2019-08-10 NOTE — Telephone Encounter (Signed)
Pt is a returning pt of Dr. Allie Bossier. She sees Dr. Brett Fairy for sleep but is requesting to see Dr. Jaynee Eagles for Migraines. Is that switch ok?

## 2019-08-10 NOTE — Telephone Encounter (Signed)
That is just fine thanks!

## 2019-08-15 DIAGNOSIS — R1013 Epigastric pain: Secondary | ICD-10-CM | POA: Diagnosis not present

## 2019-08-15 DIAGNOSIS — M419 Scoliosis, unspecified: Secondary | ICD-10-CM | POA: Diagnosis not present

## 2019-08-15 DIAGNOSIS — M542 Cervicalgia: Secondary | ICD-10-CM | POA: Diagnosis not present

## 2019-09-10 DIAGNOSIS — R42 Dizziness and giddiness: Secondary | ICD-10-CM | POA: Diagnosis not present

## 2019-09-10 DIAGNOSIS — R110 Nausea: Secondary | ICD-10-CM | POA: Diagnosis not present

## 2019-09-10 DIAGNOSIS — R457 State of emotional shock and stress, unspecified: Secondary | ICD-10-CM | POA: Diagnosis not present

## 2019-09-10 DIAGNOSIS — R079 Chest pain, unspecified: Secondary | ICD-10-CM | POA: Diagnosis not present

## 2019-09-10 DIAGNOSIS — R0789 Other chest pain: Secondary | ICD-10-CM | POA: Diagnosis not present

## 2019-10-04 DIAGNOSIS — H353132 Nonexudative age-related macular degeneration, bilateral, intermediate dry stage: Secondary | ICD-10-CM | POA: Diagnosis not present

## 2019-10-04 DIAGNOSIS — H43813 Vitreous degeneration, bilateral: Secondary | ICD-10-CM | POA: Diagnosis not present

## 2019-10-04 DIAGNOSIS — H35372 Puckering of macula, left eye: Secondary | ICD-10-CM | POA: Diagnosis not present

## 2019-10-04 DIAGNOSIS — H40013 Open angle with borderline findings, low risk, bilateral: Secondary | ICD-10-CM | POA: Diagnosis not present

## 2019-10-14 ENCOUNTER — Ambulatory Visit: Payer: Medicare Other | Attending: Internal Medicine

## 2019-10-14 DIAGNOSIS — Z23 Encounter for immunization: Secondary | ICD-10-CM | POA: Diagnosis not present

## 2019-10-14 NOTE — Progress Notes (Signed)
   Covid-19 Vaccination Clinic  Name:  Janet Nguyen    MRN: [id number] DOB: 1932/01/12  10/14/2019  [name] was observed post Covid-19 immunization for 15 minutes without incidence. She was provided with Vaccine Information Sheet and instruction to access the V-Safe system.   [name] was instructed to call 911 with any severe reactions post vaccine: Marland Kitchen Difficulty breathing  . Swelling of your face and throat  . A fast heartbeat  . A bad rash all over your body  . Dizziness and weakness    Immunizations Administered    Name Date Dose VIS Date Route   Pfizer COVID-19 Vaccine 10/14/2019 10:40 AM 0.3 mL 09/09/2019 Intramuscular   Manufacturer: [employer], [employer]   Lot: [id number]   [place]: [id number]

## 2019-10-16 NOTE — Progress Notes (Signed)
[facility]    Provider:  Dr Jaynee Eagles Requesting Provider: Lawerance Cruel, MD Primary Care Provider:  Lawerance Cruel, MD  CC:  Migraines  HPI:  Janet Nguyen is a 86 y.o. female here as requested by Lawerance Cruel, MD for migraines.  She has a past medical history of atrial flutter, paroxysmal supraventricular tachycardia, pulmonary hypertension, migraine with aura, sleep apnea, sciatica of the right side, scoliosis, fibromyalgia.  I reviewed neurology notes, patient is seeing Dr. Brett Fairy for her sleep disorder, I reviewed Dr. Edwena Felty last note which was several months ago, attempts were being made to help her with tolerating the CPAP with nasal pillows or nasal cradle because she is claustrophobic, and she was sent to me for migraines.  In the past AHI was 10.2, but she could never tolerate CPAP, she also tried a dental device, she also has chronic pain, degenerative disc disease, scoliosis, muscle and joint aches.  She does have a history of ophthalmic migraines, seen by my other colleague Dr. Leta Baptist in the past and she also follow-up with Dr. Enriqueta Shutter.  I did review notes from my colleague Dr. Suzie Portela who saw her in 2016, she reports the head and neck pain developing 2011, tried physical therapy, Celebrex, narcotics, she takes Tylenol as needed for pain, she has followed with Dr. Herma Mering and pain management at least in the past.  I do see evidence in the chart that she saw Dr. Vira Agar in March 2017, April 2016 and possibly April 2012 and however I do not have access to the full notes, Dr. Vira Agar is a neurologist and she saw him for migraines.  I also do see notes from ENT where she was diagnosed with ophthalmic migraines, the last note I see is from September 2020 where she feels her migraine auras have become more continuous, she has headaches and increased postnasal drainage, she has episodic vertigo, and she was encouraged to talk to Dr. Brett Fairy,  she was advised to stop Ambien.  I reviewed MRI of the brain images from February 2017 which appears to be last time she had brain imaging which was largely unremarkable.  She is here alone and she was referred for migraines with aura, she was diagnosed with sleep apnea and she saw Dr. Brett Fairy but she does not want to see her again, she also saw Dr. Leta Baptist in the past and now she is here with her 3rd neurologist and I will try my best to help her. She talks a lot about her sleep apnea with me however I am not a sleep doctor and I cannot help her in this arena. She says her throat is always sore, she sees Dr. Erik Obey for this. She also gets sores in her mouth and has raspy throat. She has had problems with her left carotid artery and she went to see Dr. Harrington Challenger and she has constant musculular pain, her back is "warped" and she was seeing Dr. Nelva Bush. She did yoga in the past and keeping the scoliosis at [place], she was getting injections by Dr. Nelva Bush but she is worsening, she is an avid walker. She has worsening scoliosis. She has multiple pain complaints. She spends much time complaining today about our office and I did apologize to her she says she has not been happy. She is here for migraines however, she will have migraines daily for several months then skip a month. She gets a visual aura, unknown triggers, she sees white boxes, she takes  a tylenol and sits in the chair for 30 minutes. She has a calm app on her phone. She can have multiple in a day. She sees figures sometimes, she can look at the floor and it moves, The aura is always followed by head pain in the back of the head, she has chronic neck pain daily all day, it can be throbbing and pulsating she can almost feel it, lasts about 30 minutes followed by a headache. Ongoing 5-10 years without changes in frequency or   Reviewed notes, labs and imaging from outside physicians, which showed:  MRI brain 2017: There is no evidence of acute infarct,  intracranial hemorrhage, mass, midline shift, or extra-axial fluid collection. Ventricles and sulci are normal for age. Foci of T2 hyperintensity in the subcortical and deep cerebral white matter are nonspecific but compatible with mild chronic small vessel ischemic disease. A subcentimeter pineal cyst is again noted without significant mass effect. Prior bilateral cataract extraction is noted. Paranasal sinuses and mastoid air cells are clear. Major intracranial vascular flow voids are preserved.  IMPRESSION: 1. No acute intracranial abnormality. 2. Mild chronic small vessel ischemic disease.  Review of Systems: Patient complains of symptoms per HPI as well as the following symptoms: voice difficulty. Pertinent negatives and positives per HPI. All others negative.   Social History   Socioeconomic History  . Marital status: Divorced    Spouse name: Not on file  . Number of children: 4  . Years of education: college   . Highest education level: Not on file  Occupational History  . Occupation: retired   Tobacco Use  . Smoking status: Never Smoker  . Smokeless tobacco: Never Used  Substance and Sexual Activity  . Alcohol use: Yes    Alcohol/week: 1.0 standard drinks    Types: 1 Standard drinks or equivalent per week    Comment: drink ETOH socially  . Drug use: No  . Sexual activity: Not on file  Other Topics Concern  . Not on file  Social History Narrative   Lives alone at home   Drinks tea but tries to drink decaf   Has 9 grandchildren    Social Determinants of Health   Financial Resource Strain:   . Difficulty of Paying Living Expenses: Not on file  Food Insecurity:   . Worried About Charity fundraiser in the Last Year: Not on file  . Ran Out of Food in the Last Year: Not on file  Transportation Needs:   . Lack of Transportation (Medical): Not on file  . Lack of Transportation (Non-Medical): Not on file  Physical Activity:   . Days of Exercise per Week: Not on file    . Minutes of Exercise per Session: Not on file  Stress:   . Feeling of Stress : Not on file  Social Connections:   . Frequency of Communication with Friends and Family: Not on file  . Frequency of Social Gatherings with Friends and Family: Not on file  . Attends Religious Services: Not on file  . Active Member of Clubs or Organizations: Not on file  . Attends Archivist Meetings: Not on file  . Marital Status: Not on file  Intimate Partner Violence:   . Fear of Current or Ex-Partner: Not on file  . Emotionally Abused: Not on file  . Physically Abused: Not on file  . Sexually Abused: Not on file    Family History  Problem Relation Age of Onset  . Breast cancer Mother   .  Heart disease Father     Past Medical History:  Diagnosis Date  . Arrhythmia    History of SVT with documented PVC'S and  PAC'S  12/08/12 Nuc stress test normal LV EF 74%  Event Monitor  12/01/12-01/03/13  . Celiac disease    treated by Dr. [name]  . GERD (gastroesophageal reflux disease)   . Intervertebral disc stenosis of neural canal of cervical region   . Irregular heart beat 11/30/12   ECHO-EF 60-65%  . Osteoporosis   . Scoliosis   . Scoliosis   . Sleep apnea 10/02/11 [facility]   Sleep study AHI -total sleep 10.3/hr  64.0/ hr during REM sleep.RDI 22.8/hr during total sleep 64.0/hr during REM sleep The lowest O2 sat during Non-REM and REM sleep was 86% and 88% respectively. 04/08/12 CPAP/BIPAP titration study [facility]    Patient Active Problem List   Diagnosis Date Noted  . Pulmonary hypertension, unspecified ([place]) 04/14/2019  . Ischemic colitis ([place]) 04/14/2019  . Migraine with aura and without status migrainosus, not intractable 11/08/2018  . Degeneration of lumbar intervertebral disc 10/14/2018  . Hoarseness of voice 03/04/2018  . Abdominal pain 07/01/2017  . Diverticulitis, colon   . Metabolic acidosis, increased anion gap   . Constipation  04/14/2017  . Lumbar hernia 04/14/2017  . Presbycusis of both ears 01/10/2017  . Tinnitus aurium, bilateral 01/10/2017  . Gastroesophageal reflux disease 08/20/2016  . Hemoptysis 08/20/2016  . Obstructive sleep apnea of adult 08/20/2016  . Rhinitis, chronic 08/20/2016  . Throat pain in adult 08/20/2016  . Fatigue 12/23/2015  . Sciatica of right side 10/06/2015  . History of migraine headaches 10/06/2015  . Frequent PVCs 12/28/2013  . Premature atrial contractions 12/28/2013  . PSVT (paroxysmal supraventricular tachycardia) ([place]) 12/28/2013  . Heart palpitations 07/13/2013  . Sleep apnea 04/11/2013  . Scoliosis 04/11/2013  . Atrial flutter with rapid ventricular response ([place]) 11/29/2012  . Chest pain, atypical 11/29/2012  . Fibromyalgia syndrome 11/29/2012  . Chronic steroid use 11/29/2012    Past Surgical History:  Procedure Laterality Date  . APPENDECTOMY     ruptured at age 81 and had surgery  . CARDIAC CATHETERIZATION  01/27/06    Current Outpatient Medications  Medication Sig Dispense Refill  . acetaminophen (TYLENOL) 500 MG tablet Take by mouth as needed.    . ALPRAZolam (XANAX) 0.25 MG tablet Take 0.25 mg by mouth daily as needed.     Marland Kitchen amLODipine (NORVASC) 5 MG tablet Take 1 tablet (5 mg total) by mouth daily. 90 tablet 2  . Biotin 5000 MCG TABS Take 1 tablet by mouth daily.    . Calcium 500-100 MG-UNIT CHEW Chew 1-2 tablets by mouth daily. vitafusion    . metoprolol succinate (TOPROL-XL) 50 MG 24 hr tablet TAKE 1 AND 1/2 TABLETS BY MOUTH 2 TIMES A DAY 270 tablet 0  . Multiple Vitamins-Minerals (PRESERVISION AREDS 2 PO) Take 1 tablet by mouth every other day.    Ernestine Conrad 3-6-9 Fatty Acids (OMEGA-3 FUSION PO) Take 1-2 tablets by mouth daily. vitafusion    . pantoprazole (PROTONIX) 20 MG tablet Take 20 mg by mouth daily.     . Polyvinyl Alcohol-Povidone (REFRESH OP) Apply to eye.     No current facility-administered medications for this visit.    Allergies as of  10/17/2019 - Review Complete 07/09/2019  Allergen Reaction Noted  . Gluten meal  11/29/2012  . Codeine Nausea Only 07/12/2012    Vitals: There were no  vitals taken for this visit. Last Weight:  Wt Readings from Last 1 Encounters:  07/07/19 116 lb (52.6 kg)   Last Height:   Ht Readings from Last 1 Encounters:  07/07/19 5' 1"  (1.549 m)     Physical exam: Exam: Gen: NAD Eyes: Conjunctivae clear without exudates or hemorrhage  Neuro: Detailed Neurologic Exam  Speech:    Speech is normal; fluent and spontaneous with normal comprehension.  Cognition:    The patient is oriented to person, place, and time;     recent and remote memory intact;     language fluent;     normal attention, concentration,     fund of knowledge Cranial Nerves:    The pupils are equal, round, and reactive to light.Visual fields are full. Extraocular movements are intact. The face is symmetric.  Hearing intact. Voice is normal. Coordination:    No dysmetria noted  Gait:    Good stride, stance and balance  Motor Observation:    No asymmetry, no atrophy, and no involuntary movements noted. Tone:    normal  Posture:    kyphosis    Strength:    No focal deficits noted     Sensation: intact to LT     Reflex Exam:     Assessment/Plan: I had an extremely long talk with this lovely patient over 60 minutes about migraines and migraines with aura.  We discussed auras, migraines, pathophysiology, theories of genetic predisposition. we also discussed etiology of migraines and migraines with aura which we do feel have a genetic and environmental component, we discussed lifestyle and triggers and the possibility of identifying triggers which in my opinion can help prevent some migraines but is never a silver bullet so to say.  I did discuss migraines are not "curable" although they may diminish with time or age.  We discussed her neck pain and her tight cervical muscles and I thought Botox for migraine  may help in this arena as injections are alsogiven injections into the cervical muscles during migraine protocol, we did have a talk about Botox, its history, the way that it helps improve migraines and helps with muscle pain, we reviewed injection sites.  We also discussed the new CGRP medications of which I prefer Ajovy, we also discussed dry needling for her cervical musculoskeletal neck pain.  I provided literature on what we discussed.  We had a lovely conversation and she is a lovely patient.  I did encourage her to do some research, read through some of the materials I gave her, and if there is anything that I can do to help her I am here for her.  I also reviewed images of the brain with patient from her 2017 MRI and answered all questions.  Cc: Lawerance Cruel, MD  Sarina Ill, MD  [facility] [address] [place], [place] [address]  Phone [phone] Fax [phone]

## 2019-10-17 ENCOUNTER — Ambulatory Visit (INDEPENDENT_AMBULATORY_CARE_PROVIDER_SITE_OTHER): Payer: Medicare Other | Admitting: Neurology

## 2019-10-17 ENCOUNTER — Encounter: Payer: Self-pay | Admitting: Neurology

## 2019-10-17 ENCOUNTER — Other Ambulatory Visit: Payer: Self-pay

## 2019-10-17 VITALS — BP 126/79 | HR 67 | Temp 97.0°F | Ht 62.0 in | Wt 115.0 lb

## 2019-10-17 DIAGNOSIS — G43109 Migraine with aura, not intractable, without status migrainosus: Secondary | ICD-10-CM | POA: Diagnosis not present

## 2019-10-18 ENCOUNTER — Ambulatory Visit (INDEPENDENT_AMBULATORY_CARE_PROVIDER_SITE_OTHER): Payer: Medicare Other | Admitting: Cardiovascular Disease

## 2019-10-18 ENCOUNTER — Encounter: Payer: Self-pay | Admitting: Cardiovascular Disease

## 2019-10-18 ENCOUNTER — Other Ambulatory Visit: Payer: Self-pay

## 2019-10-18 VITALS — BP 140/71 | HR 59 | Ht 62.0 in | Wt 114.0 lb

## 2019-10-18 DIAGNOSIS — I5189 Other ill-defined heart diseases: Secondary | ICD-10-CM

## 2019-10-18 DIAGNOSIS — I471 Supraventricular tachycardia, unspecified: Secondary | ICD-10-CM

## 2019-10-18 DIAGNOSIS — G4733 Obstructive sleep apnea (adult) (pediatric): Secondary | ICD-10-CM | POA: Diagnosis not present

## 2019-10-18 DIAGNOSIS — G43109 Migraine with aura, not intractable, without status migrainosus: Secondary | ICD-10-CM

## 2019-10-18 DIAGNOSIS — I10 Essential (primary) hypertension: Secondary | ICD-10-CM

## 2019-10-18 DIAGNOSIS — M4125 Other idiopathic scoliosis, thoracolumbar region: Secondary | ICD-10-CM | POA: Diagnosis not present

## 2019-10-18 DIAGNOSIS — I519 Heart disease, unspecified: Secondary | ICD-10-CM | POA: Diagnosis not present

## 2019-10-18 DIAGNOSIS — I340 Nonrheumatic mitral (valve) insufficiency: Secondary | ICD-10-CM | POA: Diagnosis not present

## 2019-10-18 NOTE — Patient Instructions (Signed)
Medication Instructions:  Continue current medications  *If you need a refill on your cardiac medications before your next appointment, please call your pharmacy*  Lab Work: None ordered  Testing/Procedures: None Ordered  Follow-Up: At [facility], you and your health needs are our priority.  As part of our continuing mission to provide you with exceptional heart care, we have created designated Provider Care Teams.  These Care Teams include your primary Cardiologist (physician) and Advanced Practice Providers (APPs -  Physician Assistants and Nurse Practitioners) who all work together to provide you with the care you need, when you need it.  Your next appointment:   6 month(s)  The format for your next appointment:   In Person  Provider:   Shelva Majestic, MD

## 2019-10-18 NOTE — Progress Notes (Signed)
50,000 patient ID: Janet Nguyen, female   DOB: May 13, 1932, 86 y.o.   MRN: [id number]     Primary  M.D.: Dr. Mardene Sayer  HPI: Janet Nguyen is a 86 y.o. female who presents for a 3 month followup cardiology evaluation.   Janet Nguyen has a history of documented SVT and also has PACs and PVCs  treated with beta blocker therapy. In April 2014 I further titrated her beta blocker therapy after cardiac event monitor revealed several bursts of recurrent SVT up to 177 beats per minute in March.  In July after she had had 2 episodes of chest fluttering which each lasted over 30 minutes which he did take metoprolol tartrate with relief I recommended further titration of her Toprol to 75 mg in the morning and 50 mg at night and if necessary she could further titrate this to 75 twice a day.  She has a history of obstructive sleep apnea but despite multiple attempts at CPAP utilization she has not been able to tolerate this. She was referred to Dr. [name] and has a customized dental appliance with mandibular advancement with improvement in some of her symptomatology. Due concern that her teeth may be moving in more recently she has has not been using customized appliance daily but has been using her old non-customized mouthguard.  At times shen has awakened abruptly from a dream with her heart pounding and a sensation of hot flashes, gasping for breath.   She also states that her scoliosis is getting worse. She does have left-sided musculoskeletal type chest pain due to her spine angulation.  Beause of her significant scoliosis, she feels she must sleep on her back.   She has a history of GERD for which she has been taking Nexium.  She presents for evaluation.  I had scheduled her for an overnight oximetry to see if she is a candidate for supplemental oxygen at nighttime since she refused to use CPAP and only very rarely uses her customized mouthpiece.  She does wear a mouthguard to reduce  bruxism.  Oximetry study was performed overnight on February 23/24.  Her mean oxygen saturation was 93.12%.  She spent 12 minutes and 16 seconds with O2 sat duration below 88% with the lowest O2 sat duration of 80%. I tried to set her up for supplemental oxygen at bedtime.  However, she has been denied for this on multiple occasions by her insurance/Medicare.  She feels that she is sleeping better.  She can only sleep in her back due to scoliosis.  Her left side is concave; her right side is convex.  She has had issues with labile blood pressure. She has had issues with lower back discomfort and sciatica leading to emergency room evaluation in November 2016.  She saw Dr. [name] for neurosurgical evaluation. She has migraine headaches. She  Recently saw Dr. [name] for these migraine headaches.  She has been on Toprol-XL 75 mg twice a day for her palpitations and presently denies any awareness an extra heartbeats. She takes Xanax on an as-needed basis for anxiety.  She has been taking Nexium 20, no grams every other day for GERD. She states her scoliosis is getting worse.  Has been experiencing more fatigue.  She also notes some occasional left hand numbness.  In a mouth guard but no ureteral longer uses her mandibular advancement device and not tolerate CPAP therapy for her obstructive sleep apnea.    She had experienced left-sided chest discomfort which most likely  was related to her significant scoliosis the potential neuropathy causing intermittent left arm and hand numbness.  I slightly reduced her Toprol-XL which she had been taking 150 mg daily and a 75 twice a day regimen to 75 and milligrams in the morning and 50 mg with ultimate plan to decrease this to 50 twice a day.  She states when she reduce this, she began to notice more optical migraines and resume taking the higher dose Toprol at 75 twice a day with improvement.  She has seen Dr. [name] for her paresthesias.  She admits to significant anxiety.   She had requested zolpidem for sleep initiation and maintenance.  In the past, we had tried 6.25 slow-release version to help with sleep maintenance, but due to cost issues preferred the 5 mg sleep initiation dose.  She experiences optical migraine headaches.  She was being seen by neurologist, Dr. Melton Alar  She continues to have issues with her scoliosis causing discomfort in her neck and arm due to her distortion.  He tells me she underwent endoscopy and colonoscopy as well as banding of hemorrhoids.  She had a benign polyp removed.  She has issues with spastic colon.  Her sleep is better with zolpiden 5 mg.  She saw Kerin Ransom on 11/13/2016.  She had noticed that her hair was "thinning" and was concerned about this being the result of Toprol.  He suggested possibly decreasing Toprol but she preferred not until she had seen me.  Since that time, she continues to experience some optical migraines.  She has noticed some leg left neck discomfort.  She denies any patches of hair loss.  She denies differential arm weakness.  She continues to have difficulty with her scoliosis with hip pain and rib cage discomfort.   When I saw her in March 2018, there was a blood pressure differential of 118/78 in the left arm and 140/80 in the right arm.  She subsequently underwent upper extremity Doppler evaluation on 12/25/2016.  She was noted have mild heterogeneous plaque with stable 1-39% bilateral internal carotid stenoses.  She had normal subclavian arteries bilaterally.  There are patent vertebral arteries with antegrade flow.  She did not have any significant blood pressure differential and had triphasic waveforms in both her right and biphasic in her left brachial artery.  Left blood pressure was 8 mm higher than the right brachial pressure.  At times, she continues to experience some vague chest wall symptoms, which most likely related to her posture.  She denies significant shortness of breath.  She has to sleep on  her back because of her scoliosis.  Uses a chin strap to prevent oral breathing.  She is not on CPAP.   In June 2019 she had concerns about some of her medications possibly causing hair loss or memory loss.  She admits to having dry eyes.  She continues to have difficulty from her scolWsaw her in November 2019 at which time she,  felt well from a cardiac standphk and apparently had a new oral appliance made.  She used this initially but then started to notice some teeth discomfort.  Apparently this again was treated by Dr.e still admits to being tired.  She has issues with her abdomen being tight which she believes is related to her scoliosis.  Her GERD is controlled with pantoprazole.    She was seen by Dr. Mardene Sayer for her primary care.  She had developed a fungal infection of her toenail.  He had  given her to [place] fine 20 mg.  She has not started this and was hesitant to do this due to potential interaction with metoprolol.  She also was evaluated by Dr. Brett Fairy.  According to Janet Nguyen she did not evaluate her ophthalmic migraines.  However she was planning to do a possible home study to reassess her sleep apnea.  In the past she did not tolerate any CPAP therapy and although initially tolerated customized oral appliance this seemed to cause difficulty with movement of her teeth.  She has been using the CALM app on her smart phone to help with relaxation and improve sleep and is chronically dependent on low-dose zolpidem prescribed by Dr. Harrington Challenger.  She denies chest pain.  Her palpitations are well controlled with her current dose of metoprolol 75 mils twice a day and she can to be on low-dose amlodipine 2.5 mg.    Since I last saw her in June 2020, she underwent the home study by Dr. Theodoro Clock was done after several studies with no data.  According to Dr. Edwena Felty note, AHI was 26.5 which was moderate and during REM sleep AHI rose to 34.9.  Snoring was not recorded.  Apparently there was  discussion of possible Financial planner.  Janet Nguyen is not interested.  I last saw her in October 2020 at which time she had continued issues with migraines and some vertigo for which he takes the Epley maneuver.  She continued to have discomfort related to her scoliosis with muscle discomfort on her chest.  She believes she is waking up during dreaming and not sleeping well.  Her blood pressure has increased.  At her prior evaluation, I had instituted low-dose amlodipine at 2.5 mg.  With her blood pressure elevated during that evaluation I further titrated this to 5 mg I recommended she continue taking Toprol XL 75 mg twice a day.  She did not have any episodes of palpitations or recurrent SVT.  Since her last evaluation, she tells me she has seen Dr. Lavell Anchors of neurology.  She continues to experience some muscular neck aches which may be related to her scoliosis but do not sound ischemic in etiology.  She states she has some varicose veins in the right lower extremity and complains of trivial ankle swelling.  She denies palpitations.  She presents for evaluation.  Past Medical History:  Diagnosis Date  . Arrhythmia    History of SVT with documented PVC'S and  PAC'S  12/08/12 Nuc stress test normal LV EF 74%  Event Monitor  12/01/12-01/03/13  . Atrial flutter ([place])   . Celiac disease    treated by Dr. [name]  . GERD (gastroesophageal reflux disease)   . Intervertebral disc stenosis of neural canal of cervical region   . Irregular heart beat 11/30/12   ECHO-EF 60-65%  . Osteoporosis   . PMR (polymyalgia rheumatica) ([facility])    Dr. Marijean Bravo; pt states she was diagnosed 10-15 years ago, not treated at this time or any issues that she is aware of.  . Scoliosis   . Scoliosis   . Sleep apnea 10/02/11 [facility]   Sleep study AHI -total sleep 10.3/hr  64.0/ hr during REM sleep.RDI 22.8/hr during total sleep 64.0/hr during REM sleep The lowest O2 sat during Non-REM and REM sleep was 86%  and 88% respectively. 04/08/12 CPAP/BIPAP titration study [facility]    Past Surgical History:  Procedure Laterality Date  . APPENDECTOMY     ruptured at  age 40 and had surgery  . CARDIAC CATHETERIZATION  01/27/06  . cataract surgery  2015   Dr. Herbert Deaner; March & April 2015    Allergies  Allergen Reactions  . Augmentin [Amoxicillin-Pot Clavulanate]     Has never had, son allergic   . Gluten Meal     Unknown  . Naproxen     Stomach upset  . Codeine Nausea Only    Current Outpatient Medications  Medication Sig Dispense Refill  . ALPRAZolam (XANAX) 0.5 MG tablet Take 0.5 mg by mouth as needed.    Marland Kitchen amLODipine (NORVASC) 5 MG tablet Take 1 tablet (5 mg total) by mouth daily. 90 tablet 2  . clidinium-chlordiazePOXIDE (LIBRAX) 5-2.5 MG capsule Take 1 capsule by mouth as needed.    Noelle Penner ACETAMINOPHEN PO Take 500 mg by mouth as needed (pain).    Marland Kitchen FLUTICASONE PROPIONATE, NASAL, NA Place into the nose daily as needed (rhinitis).    . Lactobacillus (DIGESTIVE HEALTH PROBIOTIC PO) Take by mouth every other day.    . metoprolol succinate (TOPROL-XL) 50 MG 24 hr tablet TAKE 1 AND 1/2 TABLETS BY MOUTH 2 TIMES A DAY 270 tablet 0  . metoprolol tartrate (LOPRESSOR) 25 MG tablet Take 25 mg by mouth. Uses rarely as needed for atrial flutters    . Multiple Vitamins-Minerals (PRESERVISION AREDS 2 PO) Take 1 tablet by mouth every other day.    . pantoprazole (PROTONIX) 20 MG tablet Take 20 mg by mouth daily.     Marland Kitchen Phenylephrine-APAP-guaiFENesin (EQ SINUS CONGESTION & PAIN PO) Take by mouth as needed (for sinus pain). Contains acetaminophen 325 mg and Phenylephrine 5 mg    . Polyvinyl Alcohol-Povidone (REFRESH OP) Apply to eye at bedtime.     Marland Kitchen UNABLE TO FIND Take by mouth. Med Name: [place] WOMEN'S GUMMIES ONCE-TWICE DAILY    . UNABLE TO FIND Med Name: VITAFUSION CALCIUM PLUS D CHEWABLES, ONCE-TWICE DAILY    . UNABLE TO FIND Med Name: VITAFUSION OMEGA-3, ONCE-TWICE DAILY    .  UNABLE TO FIND 1,000 mcg daily. Med Name: [place] BIOTIN    . UNABLE TO FIND 2 (two) times daily. Med Name: EQUATE FIBER POWDER/MIRALAX    . UNABLE TO FIND as needed (itching ears). Med Name: FLUOCINONIDE TOPICAL SOLUTION (DROPS)    . ZOLPIDEM TARTRATE ER PO Take 2.5 mg by mouth at bedtime.     No current facility-administered medications for this visit.    Socially she is divorced has 4 children 9 grandchildren. She does exercise. No tobacco use. She does occasional wine.  ROS General: Negative; No fevers, chills, or night sweats;  HEENT: Negative; No changes in vision or hearing, sinus congestion, difficulty swallowing Pulmonary: Negative; No cough, wheezing, shortness of breath, hemoptysis Cardiovascular: Positive for occasional chest wall pain and nocturnal palpitations GI: Negative; No nausea, vomiting, diarrhea, or abdominal pain GU: Negative; No dysuria, hematuria, or difficulty voiding Musculoskeletal: Positive for significant scoliosis; fibromyalgia;  joint pain, or weakness Hematologic/Oncology: Negative; no easy bruising, bleeding Endocrine: Negative; no heat/cold intolerance; no diabetes Neuro: History of migraine headaches Skin: Positive for fungal infection of her toenail Psychiatric: Negative; No behavioral problems, depression Sleep: Positive for sleep apnea ; No snoring, daytime sleepiness, hypersomnolence, bruxism, restless legs, hypnogognic hallucinations, no cataplexy Other comprehensive 14 point system review is negative.  PE BP 140/71   Pulse (!) 59   Ht 5' 2"  (1.575 m)   Wt 114 lb (51.7 kg)   SpO2 98%   BMI 20.85 kg/m  Repeat blood pressure by me was improved at 114/62  Wt Readings from Last 3 Encounters:  10/18/19 114 lb (51.7 kg)  10/17/19 115 lb (52.2 kg)  07/07/19 116 lb (52.6 kg)   General: Alert, oriented, no distress.  Skin: normal turgor, no rashes, warm and dry HEENT: Normocephalic, atraumatic. Pupils equal round and reactive to  light; sclera anicteric; extraocular muscles intact;  Nose without nasal septal hypertrophy Mouth/Parynx benign; Mallinpatti scale 3 Neck: No JVD, no carotid bruits; normal carotid upstroke Lungs: clear to ausculatation and percussion; no wheezing or rales Chest wall: without tenderness to palpitation Heart: PMI not displaced, RRR, s1 s2 normal, 1/6 systolic murmur, no diastolic murmur, no rubs, gallops, thrills, or heaves Abdomen: soft, nontender; no hepatosplenomehaly, BS+; abdominal aorta nontender and not dilated by palpation. Back: Kyphoscoliosis; no CVA tenderness Pulses 2+ Musculoskeletal: full range of motion, normal strength, no joint deformities Extremities: Trivial right ankle edema with mild prominent venous vessels; no clubbing cyanosis or edema, Homan's sign negative  Neurologic: grossly nonfocal; Cranial nerves grossly wnl Psychologic: Normal mood and affect   ECG (independently read by me): Sinus bradycardia 59 bpm.  QS complex V1 V2.  Normal intervals.  No ectopy  October 2020 ECG (independently read by me): Normal sinus rhythm at 63 bpm.  QS complex V1 V2.  No ectopy.  Normal intervals.  June 2020 ECG (independently read by me): NSR at 62; no ectopy; normal intervals   November 2019 ECG (independently read by me): Normal sinus rhythm with PAC.  Ventricular rate 66.  QS V1 V2.  Mild T wave abnormality  June 2019 ECG (independently read by me): Normal sinus rhythm at 64 bpm.  Normal intervals.  No ectopy.  September 2018 ECG (independently read by me): Normal sinus rhythm at 64 bpm.  Isolated PAC.  QTc interval 414 ms.  QS V1 V2, unchanged.  May 2018 ECG (independently read by me): Normal sinus rhythm at 65 bpm.  QS V1, V2.  Normal intervals.  No ST segment changes.  March 2018 ECG (independently read by me): Normal sinus rhythm at 65 bpm.  No ectopy.  Normal intervals.  September 2017 ECG (independently read by me): Normal sinus rhythm at 62 bpm.  QS V1 and V2.   Normal intervals.  April 2017 ECG (independently read by me): Normal sinus rhythm at 70 bpm.  No ectopy.  PR interval 158 ms and QTc interval 414 ms.  March 2017 ECG (independently read by me): normal sinus rhythm at 61 bpm..  No ST segment changes.  Normal intervals.  QTc interval 396 ms.  August 2014 ECG (independently read by me): Normal sinus rhythm at 63 bpm.  QRS couplets V1 V2.  No cigarette ST-T changes.  May 2016 ECG (independently read by me): Sinus bradycardia 59 bpm.  No ectopy.  February 2016 ECG (independently read by me): Sinus bradycardia 56 bpm.  Normal intervals.  Prior ECG (independently read by me): Normal sinus rhythm at 62 beats per minute.  QTc interval 411 milliseconds.  QRS complex V1, V2.  Normal intervals.  ECG (independently read by me): Normal sinus rhythm at 62 beats per minute. Normal intervals. QTc interval 399 ms.  Prior ECG of 07/13/2013: Sinus rhythm at 61 beats per minute. QTc interval 406 ms. PR interval normal at 168 ms.  LABS: BMP Latest Ref Rng & Units 12/05/2017 07/02/2017 07/01/2017  Glucose 65 - 99 mg/dL 129(H) 88 102(H)  BUN 6 - 20 mg/dL 13 9 13   Creatinine 0.44 -  1.00 mg/dL 0.87 0.95 0.82  Sodium 135 - 145 mmol/L 136 139 134(L)  Potassium 3.5 - 5.1 mmol/L 3.8 3.8 3.6  Chloride 101 - 111 mmol/L 100(L) 109 101  CO2 22 - 32 mmol/L 26 24 24   Calcium 8.9 - 10.3 mg/dL 9.6 8.7(L) 9.3   Hepatic Function Latest Ref Rng & Units 12/05/2017 07/01/2017 06/30/2017  Total Protein 6.5 - 8.1 g/dL 7.6 7.2 8.2(H)  Albumin 3.5 - 5.0 g/dL 4.4 4.4 5.0  AST 15 - 41 U/L 30 28 40  ALT 14 - 54 U/L 18 16 17   Alk Phosphatase 38 - 126 U/L 55 40 46  Total Bilirubin 0.3 - 1.2 mg/dL 0.6 1.3(H) 1.4(H)  Bilirubin, Direct 0.1 - 0.5 mg/dL - 0.1 -   CBC Latest Ref Rng & Units 12/05/2017 07/02/2017 07/01/2017  WBC 4.0 - 10.5 K/uL 9.2 10.1 8.3  Hemoglobin 12.0 - 15.0 g/dL 12.0 11.2(L) 14.1  Hematocrit 36.0 - 46.0 % 36.0 32.9(L) 40.8  Platelets 150 - 400 K/uL 288 224 272   Lab  Results  Component Value Date   TSH 1.07 12/03/2016  Lipid Panel     Component Value Date/Time   CHOL 154 12/03/2016 0845   TRIG 81 12/03/2016 0845   HDL 67 12/03/2016 0845   CHOLHDL 2.3 12/03/2016 0845   VLDL 16 12/03/2016 0845   LDLCALC 71 12/03/2016 0845     RADIOLOGY: No results found.  IMPRESSION:  1. Essential hypertension   2. PSVT (paroxysmal supraventricular tachycardia) ([place])   3. Mild mitral regurgitation   4. Grade I diastolic dysfunction   5. OSA (obstructive sleep apnea)   6. Migraine with aura and without status migrainosus, not intractable   7. Other idiopathic scoliosis, thoracolumbar region     ASSESSMENT AND PLAN: Janet Nguyen is an 86 year old female who has a history of documented SVT and PACs/PVCs which have been controlled with beta blocker therapy. She has documented normal systolic function on her echo in March 2014 with grade 1 diastolic dysfunction and had  significant left atrial dilatation and mild dilatation of the right ventricle with moderate dilatation of the right atrium. She has mild/moderate pulmonary hypertension with PA pressures of 42 mm. Her palpitations have resolved on  Toprol-XL 75 mg twice a day.  She has obstructive sleep apnea but did not tolerate CPAP therapy.  She also had undergone oral appliance treatment by Dr. Oneal Grout.  She felt that this was shifting her teeth and stop using this.  A reevaluation with Dr. Brett Fairy confirmed moderate sleep apnea.  She has no interest in Financial planner.  She continues to be bothered by her body posture from her scoliosis which creates musculoskeletal discomfort.  She is not having any anginal symptomatology.  On repeat blood pressure today by me her blood pressure was down well controlled on her regimen consisting of amlodipine 5 mg in addition to Toprol-XL 75 mg twice a day.  She has a history of migraines and has seen Dr. Lavell Anchors.  Her last echo Doppler study in October 2018 showed EF 55 to 60%  with grade 1 diastolic dysfunction.  She had mitral annular calcification with mild MR.  There was mild pulmonary hypertension with PA pressure 34 mm.  Left atrium was mild to moderately dilated.  She is scheduled to get her second Covid booster injection on February 5.  She prefers that I see her on a 36-monthinterval and I will see her in 6 months for reevaluation.  Time spent: 25 minutes Troy Sine, MD, [facility]  10/23/2019 9:55 AM

## 2019-10-23 ENCOUNTER — Encounter: Payer: Self-pay | Admitting: Cardiovascular Disease

## 2019-10-26 DIAGNOSIS — K219 Gastro-esophageal reflux disease without esophagitis: Secondary | ICD-10-CM | POA: Diagnosis not present

## 2019-10-26 DIAGNOSIS — G43109 Migraine with aura, not intractable, without status migrainosus: Secondary | ICD-10-CM | POA: Diagnosis not present

## 2019-10-26 DIAGNOSIS — R490 Dysphonia: Secondary | ICD-10-CM | POA: Diagnosis not present

## 2019-10-26 DIAGNOSIS — J310 Chronic rhinitis: Secondary | ICD-10-CM | POA: Diagnosis not present

## 2019-10-26 DIAGNOSIS — G4733 Obstructive sleep apnea (adult) (pediatric): Secondary | ICD-10-CM | POA: Diagnosis not present

## 2019-11-04 ENCOUNTER — Ambulatory Visit: Payer: Medicare Other | Attending: Internal Medicine

## 2019-11-04 DIAGNOSIS — Z23 Encounter for immunization: Secondary | ICD-10-CM | POA: Insufficient documentation

## 2019-11-04 NOTE — Progress Notes (Signed)
   Covid-19 Vaccination Clinic  Name:  [name]    MRN: [id number] DOB: [birth year]  11/04/2019  [name] was observed post Covid-19 immunization for 15 minutes without incidence. She was provided with Vaccine Information Sheet and instruction to access the V-Safe system.   [name] was instructed to call 911 with any severe reactions post vaccine: Marland Kitchen Difficulty breathing  . Swelling of your face and throat  . A fast heartbeat  . A bad rash all over your body  . Dizziness and weakness    Immunizations Administered    Name Date Dose VIS Date Route   Pfizer COVID-19 Vaccine 11/04/2019 11:02 AM 0.3 mL 09/09/2019 Intramuscular   Manufacturer: [place]   Lot: [id number]   [place]: [address]-9

## 2019-11-14 ENCOUNTER — Other Ambulatory Visit: Payer: Self-pay | Admitting: Cardiovascular Disease

## 2019-11-22 DIAGNOSIS — L219 Seborrheic dermatitis, unspecified: Secondary | ICD-10-CM | POA: Diagnosis not present

## 2019-11-22 DIAGNOSIS — L659 Nonscarring hair loss, unspecified: Secondary | ICD-10-CM | POA: Diagnosis not present

## 2019-11-28 ENCOUNTER — Ambulatory Visit: Payer: Medicare Other | Attending: Family Medicine

## 2019-11-28 ENCOUNTER — Other Ambulatory Visit: Payer: Self-pay

## 2019-11-28 DIAGNOSIS — M6281 Muscle weakness (generalized): Secondary | ICD-10-CM | POA: Diagnosis not present

## 2019-11-28 DIAGNOSIS — R293 Abnormal posture: Secondary | ICD-10-CM | POA: Diagnosis not present

## 2019-11-28 DIAGNOSIS — M542 Cervicalgia: Secondary | ICD-10-CM | POA: Diagnosis not present

## 2019-11-28 DIAGNOSIS — M62830 Muscle spasm of back: Secondary | ICD-10-CM | POA: Diagnosis not present

## 2019-11-28 DIAGNOSIS — M545 Low back pain: Secondary | ICD-10-CM | POA: Diagnosis not present

## 2019-11-28 DIAGNOSIS — G8929 Other chronic pain: Secondary | ICD-10-CM | POA: Diagnosis not present

## 2019-11-28 DIAGNOSIS — M4125 Other idiopathic scoliosis, thoracolumbar region: Secondary | ICD-10-CM | POA: Diagnosis not present

## 2019-11-28 NOTE — Patient Instructions (Addendum)
Trigger Point Dry Needling  . What is Trigger Point Dry Needling (DN)? o DN is a physical therapy technique used to treat muscle pain and dysfunction. Specifically, DN helps deactivate muscle trigger points (muscle knots).  o A thin filiform needle is used to penetrate the skin and stimulate the underlying trigger point. The goal is for a local twitch response (LTR) to occur and for the trigger point to relax. No medication of any kind is injected during the procedure.   . What Does Trigger Point Dry Needling Feel Like?  o The procedure feels different for each individual patient. Some patients report that they do not actually feel the needle enter the skin and overall the process is not painful. Very mild bleeding may occur. However, many patients feel a deep cramping in the muscle in which the needle was inserted. This is the local twitch response.   Marland Kitchen How Will I feel after the treatment? o Soreness is normal, and the onset of soreness may not occur for a few hours. Typically this soreness does not last longer than two days.  o Bruising is uncommon, however; ice can be used to decrease any possible bruising.  o In rare cases feeling tired or nauseous after the treatment is normal. In addition, your symptoms may get worse before they get better, this period will typically not last longer than 24 hours.   . What Can I do After My Treatment? o Increase your hydration by drinking more water for the next 24 hours. o You may place ice or heat on the areas treated that have become sore, however, do not use heat on inflamed or bruised areas. Heat often brings more relief post needling. o You can continue your regular activities, but vigorous activity is not recommended initially after the treatment for 24 hours. o DN is best combined with other physical therapy such as strengthening, stretching, and other therapies.    Access Code: [email or web address]  URL: [email or web address]  Date: 11/28/2019   Prepared by: Sigurd Sos   Exercises Seated Cervical Flexion AROM - 3 reps - 1 sets - 20 hold - 3x daily                            - 7x weekly Seated Cervical Sidebending AROM - 3 reps - 1 sets - 20 hold - 3x daily - 7x weekly Seated Cervical Rotation AROM - 3 reps - 1 sets - 20 hold - 3x daily - 7x weekly Seated Correct Posture - 10 reps - 3 sets - 1x daily - 7x weekly  [facility] [address], [place],  [address] Phone # [phone] Fax [phone]

## 2019-11-28 NOTE — Therapy (Signed)
[facility] [address] [place], [place], [address] Phone: [phone]   Fax:  [phone]  Physical Therapy Treatment  Patient Details  Name: Janet Nguyen MRN: [id number] Date of Birth: 04-28-1932 Referring Provider (PT): Melinda Crutch, MD   Encounter Date: 11/28/2019  PT End of Session - 11/28/19 1441    Visit Number  1    Date for PT Re-Evaluation  01/23/20    Authorization Type  Medicare    PT Start Time  1401    PT Stop Time  1444    PT Time Calculation (min)  43 min    Activity Tolerance  Patient tolerated treatment well    Behavior During Therapy  [facility] for tasks assessed/performed       Past Medical History:  Diagnosis Date  . Arrhythmia    History of SVT with documented PVC'S and  PAC'S  12/08/12 Nuc stress test normal LV EF 74%  Event Monitor  12/01/12-01/03/13  . Atrial flutter ([place])   . Celiac disease    treated by Dr. [name]  . GERD (gastroesophageal reflux disease)   . Intervertebral disc stenosis of neural canal of cervical region   . Irregular heart beat 11/30/12   ECHO-EF 60-65%  . Osteoporosis   . PMR (polymyalgia rheumatica) (HCC)    Dr. Marijean Bravo; pt states she was diagnosed 10-15 years ago, not treated at this time or any issues that she is aware of.  . Scoliosis   . Scoliosis   . Sleep apnea 10/02/11 [facility]   Sleep study AHI -total sleep 10.3/hr  64.0/ hr during REM sleep.RDI 22.8/hr during total sleep 64.0/hr during REM sleep The lowest O2 sat during Non-REM and REM sleep was 86% and 88% respectively. 04/08/12 CPAP/BIPAP titration study [facility]    Past Surgical History:  Procedure Laterality Date  . APPENDECTOMY     ruptured at age 24 and had surgery  . CARDIAC CATHETERIZATION  01/27/06  . cataract surgery  2015   Dr. Herbert Deaner; March & April 2015    There were no vitals filed for this visit.  Subjective Assessment - 11/28/19 1403    Subjective  I have chronic pain in my neck and low back and I need help to manage it.  I havent been able to get a massage and my muscles are tight.  I haven't been doing my exercises that I have always done.    Pertinent History  migraines, significant scoliosis, polymyalgia rheumatica, sleep apnea    Diagnostic tests  none    Patient Stated Goals  reduce pain and improve mobility to be able to do exercises.    Currently in Pain?  Yes    Pain Score  8     Pain Location  Back   neck   Pain Orientation  Right;Left;Lower    Pain Descriptors / Indicators  Aching    Pain Type  Chronic pain    Pain Onset  More than a month ago    Pain Frequency  Constant    Aggravating Factors   no pattern to the pain.    Pain Relieving Factors  Tylenol, meditation app         [facility] PT Assessment - 11/28/19 0001      Assessment   Medical Diagnosis  low back pain, neck pain, thoracic pain    Referring Provider (PT)  Melinda Crutch, MD    Onset Date/Surgical Date  --  chronic   Next MD Visit  11/30/19    Prior Therapy  2 years ago      Balance Screen   Has the patient fallen in the past 6 months  No    Has the patient had a decrease in activity level because of a fear of falling?   No    Is the patient reluctant to leave their home because of a fear of falling?   No      Home Environment   Living Environment  Private residence    Living Arrangements  Alone    Type of [place]      Prior Function   Level of [place]  Retired      Associate Professor   Overall Cognitive Status  Within Functional Limits for tasks assessed      Observation/Other Assessments   Focus on Therapeutic Outcomes (FOTO)   --   didn't complete     Posture/Postural Control   Posture/Postural Control  Postural limitations    Postural Limitations  Flexed trunk;Increased thoracic kyphosis;Forward head    Posture Comments  significant scoliosis      ROM / Strength   AROM / PROM / Strength   AROM;Strength      AROM   Overall AROM   Deficits    Overall AROM Comments  cervical A/ROM limited by 50% in all directions with pain at end range.  Lumbar A/ROM limited by 50% into Rt sidebending, and 25% in all other lumbar motions      Strength   Overall Strength  Deficits    Overall Strength Comments  4 to 4+/5 bil UE and LE strength throughout      Palpation   Palpation comment  Pt with tension and trigger points over bil upper traps, suboccipitals, thoracic paraspinals and rhomboids and lumbar paraspinals      Transfers   Transfers  Sit to Stand;Stand to Sit    Sit to Stand  With upper extremity assist      Ambulation/Gait   Ambulation/Gait  Yes    Ambulation/Gait Assistance  6: Modified independent (Device/Increase time)    Gait Pattern  Step-through pattern;Decreased stride length;Decreased trunk rotation;Trunk flexed                   [facility] Adult PT Treatment/Exercise - 11/28/19 0001      Manual Therapy   Manual Therapy  Myofascial release;Soft tissue mobilization    Manual therapy comments  soft tissue elongation to bil upper traps, suboccipitals and bil cervical paraspinals and lumbar paraspinals (sidelying) with pt propped on wedge             PT Education - 11/28/19 1428    Education Details  DN info, Access Code: [email or web address]    Person(s) Educated  Patient    Methods  Explanation;Demonstration;Handout    Comprehension  Verbalized understanding;Returned demonstration       PT Short Term Goals - 11/28/19 1407      PT SHORT TERM GOAL #1   Title  independent with postural correction exercises to elongate her trunk and separate rib cage from pelvis    Time  3    Period  Weeks    Status  New    Target Date  12/19/19      PT SHORT TERM GOAL #2   Title  report a 20% reduction in LBP and neck pain with sitting    Time  3  Period  Weeks    Status  New    Target Date  12/19/19        PT Long Term Goals - 11/28/19 1549      PT LONG TERM  GOAL #1   Title  independent with postural strengthening exercises and flexibility exercises to address scoliosis    Time  8    Period  Weeks    Status  New    Target Date  01/23/20      PT LONG TERM GOAL #2   Title  report a 40% reduction in neck and low back pain with sitting    Baseline  --    Time  8    Period  Weeks    Status  New    Target Date  01/23/20      PT LONG TERM GOAL #3   Title  improve cervical A/ROM to 50 degrees bilaterally to improve ability to look to the side with driving    Baseline  --    Time  8    Period  Weeks    Status  New    Target Date  01/23/20            Plan - 11/28/19 1545    Clinical Impression Statement  Pt presents to PT with chronic neck/thoracic/lumbar pain.  Pt has significant scoliosis that appears stable since this PT treated this patient ~2 years ago.  Pt with Rt thoracic convexity and poor abdominal stabilization and space between ribs and iliac crest.  Pt has complex medical history including osteoporosis, sleep apnea and polymyalgia rheumatica.  Pt has not been performing her regular exercise routine with as much consistency as she used to.  Pt reports 8/10 neck/thoracic and lumbar pain that is fairly constant and limits her functional mobility. Pt demonstrates limited spinal mobility due to scoliotic curvatures and pain at end range cervical motions.  Pt with diffuse palpable tenderness in all areas of the spine and associated musculature.  Pt will benefit from PT to address chronic muscle pain to allow pt to return to regular exercise routine that addresses strength and flexibility imbalances.    Personal Factors and Comorbidities  Comorbidity 3+    Comorbidities  scoliosis, sleep apnea, osteoporosis, polymyalgia rheumatica    Examination-Activity Limitations  Bed Mobility;Carry;Locomotion Level;Stand;Transfers    Examination-Participation Restrictions  Community Activity;Shop;Meal Prep    Stability/Clinical Decision Making   Evolving/Moderate complexity    Clinical Decision Making  Moderate    Rehab Potential  Good    PT Frequency  2x / week    PT Duration  8 weeks    PT Treatment/Interventions  ADLs/Self Care Home Management;Cryotherapy;Electrical Stimulation;Moist Heat;Neuromuscular re-education;Therapeutic exercise;Therapeutic activities;Functional mobility training;Patient/family education;Manual techniques;Dry needling;Passive range of motion;Taping    PT Next Visit Plan  dry needling to cervical/lumbar, manual to address tension, gentle flexibility    PT Home Exercise Plan  Access Code: [email or web address]    Consulted and Agree with Plan of Care  Patient       Patient will benefit from skilled therapeutic intervention in order to improve the following deficits and impairments:  Decreased activity tolerance, Decreased mobility, Decreased strength, Postural dysfunction, Improper body mechanics, Impaired flexibility, Pain, Increased muscle spasms, Decreased endurance, Decreased range of motion  Visit Diagnosis: Abnormal posture - Plan: PT plan of care cert/re-cert  Muscle weakness (generalized) - Plan: PT plan of care cert/re-cert  Other idiopathic scoliosis, thoracolumbar region - Plan: PT plan of care cert/re-cert  Muscle spasm  of back - Plan: PT plan of care cert/re-cert  Chronic bilateral low back pain without sciatica - Plan: PT plan of care cert/re-cert  Cervicalgia - Plan: PT plan of care cert/re-cert     Problem List Patient Active Problem List   Diagnosis Date Noted  . Pulmonary hypertension, unspecified ([place]) 04/14/2019  . Ischemic colitis ([place]) 04/14/2019  . Migraine with aura and without status migrainosus, not intractable 11/08/2018  . Degeneration of lumbar intervertebral disc 10/14/2018  . Hoarseness of voice 03/04/2018  . Abdominal pain 07/01/2017  . Diverticulitis, colon   . Metabolic acidosis, increased anion gap   . Constipation 04/14/2017  . Lumbar hernia 04/14/2017  . Presbycusis  of both ears 01/10/2017  . Tinnitus aurium, bilateral 01/10/2017  . Gastroesophageal reflux disease 08/20/2016  . Hemoptysis 08/20/2016  . Obstructive sleep apnea of adult 08/20/2016  . Rhinitis, chronic 08/20/2016  . Throat pain in adult 08/20/2016  . Fatigue 12/23/2015  . Sciatica of right side 10/06/2015  . History of migraine headaches 10/06/2015  . Frequent PVCs 12/28/2013  . Premature atrial contractions 12/28/2013  . PSVT (paroxysmal supraventricular tachycardia) ([place]) 12/28/2013  . Heart palpitations 07/13/2013  . Sleep apnea 04/11/2013  . Scoliosis 04/11/2013  . Atrial flutter with rapid ventricular response ([place]) 11/29/2012  . Chest pain, atypical 11/29/2012  . Fibromyalgia syndrome 11/29/2012  . Chronic steroid use 11/29/2012     Sigurd Sos, PT 11/28/19 3:53 PM  [facility] [address], [place], [place], [address] Phone: [phone]   Fax:  [phone]  Name: Janet Nguyen MRN: [id number] Date of Birth: [birth year]

## 2019-11-30 DIAGNOSIS — G479 Sleep disorder, unspecified: Secondary | ICD-10-CM | POA: Diagnosis not present

## 2019-11-30 DIAGNOSIS — G43909 Migraine, unspecified, not intractable, without status migrainosus: Secondary | ICD-10-CM | POA: Diagnosis not present

## 2019-11-30 DIAGNOSIS — R413 Other amnesia: Secondary | ICD-10-CM | POA: Diagnosis not present

## 2019-11-30 DIAGNOSIS — L659 Nonscarring hair loss, unspecified: Secondary | ICD-10-CM | POA: Diagnosis not present

## 2019-11-30 DIAGNOSIS — M549 Dorsalgia, unspecified: Secondary | ICD-10-CM | POA: Diagnosis not present

## 2019-11-30 DIAGNOSIS — R0982 Postnasal drip: Secondary | ICD-10-CM | POA: Diagnosis not present

## 2019-11-30 DIAGNOSIS — F419 Anxiety disorder, unspecified: Secondary | ICD-10-CM | POA: Diagnosis not present

## 2019-11-30 DIAGNOSIS — K900 Celiac disease: Secondary | ICD-10-CM | POA: Diagnosis not present

## 2019-11-30 DIAGNOSIS — K219 Gastro-esophageal reflux disease without esophagitis: Secondary | ICD-10-CM | POA: Diagnosis not present

## 2019-12-14 ENCOUNTER — Ambulatory Visit: Payer: Medicare Other

## 2019-12-14 ENCOUNTER — Other Ambulatory Visit: Payer: Self-pay

## 2019-12-14 DIAGNOSIS — M6281 Muscle weakness (generalized): Secondary | ICD-10-CM

## 2019-12-14 DIAGNOSIS — R293 Abnormal posture: Secondary | ICD-10-CM | POA: Diagnosis not present

## 2019-12-14 DIAGNOSIS — G8929 Other chronic pain: Secondary | ICD-10-CM | POA: Diagnosis not present

## 2019-12-14 DIAGNOSIS — M62830 Muscle spasm of back: Secondary | ICD-10-CM | POA: Diagnosis not present

## 2019-12-14 DIAGNOSIS — M4125 Other idiopathic scoliosis, thoracolumbar region: Secondary | ICD-10-CM

## 2019-12-14 DIAGNOSIS — M545 Low back pain: Secondary | ICD-10-CM | POA: Diagnosis not present

## 2019-12-14 NOTE — Therapy (Signed)
[facility] [address] [place], [place], [address] Phone: [phone]   Fax:  [phone]  Physical Therapy Treatment  Patient Details  Name: Janet Nguyen MRN: [id number] Date of Birth: Aug 23, 1932 Referring Provider (PT): Melinda Crutch, MD   Encounter Date: 12/14/2019  PT End of Session - 12/14/19 1143    Visit Number  2    Date for PT Re-Evaluation  01/23/20    Authorization Type  Medicare    PT Start Time  1102    PT Stop Time  1143    PT Time Calculation (min)  41 min    Activity Tolerance  Patient tolerated treatment well    Behavior During Therapy  [facility] for tasks assessed/performed       Past Medical History:  Diagnosis Date  . Arrhythmia    History of SVT with documented PVC'S and  PAC'S  12/08/12 Nuc stress test normal LV EF 74%  Event Monitor  12/01/12-01/03/13  . Atrial flutter ([place])   . Celiac disease    treated by Dr. [name]  . GERD (gastroesophageal reflux disease)   . Intervertebral disc stenosis of neural canal of cervical region   . Irregular heart beat 11/30/12   ECHO-EF 60-65%  . Osteoporosis   . PMR (polymyalgia rheumatica) (HCC)    Dr. Marijean Bravo; pt states she was diagnosed 10-15 years ago, not treated at this time or any issues that she is aware of.  . Scoliosis   . Scoliosis   . Sleep apnea 10/02/11 [facility]   Sleep study AHI -total sleep 10.3/hr  64.0/ hr during REM sleep.RDI 22.8/hr during total sleep 64.0/hr during REM sleep The lowest O2 sat during Non-REM and REM sleep was 86% and 88% respectively. 04/08/12 CPAP/BIPAP titration study [facility]    Past Surgical History:  Procedure Laterality Date  . APPENDECTOMY     ruptured at age 61 and had surgery  . CARDIAC CATHETERIZATION  01/27/06  . cataract surgery  2015   Dr. Herbert Deaner; March & April 2015    There were no vitals filed for this visit.  Subjective Assessment - 12/14/19 1103    Subjective  I am exhausted because I have been having bad dreams.  I think this might be medication related.    Currently in Pain?  Yes    Pain Score  5    neck   Pain Location  Back    Pain Orientation  Left;Right;Lower    Pain Descriptors / Indicators  Aching    Pain Type  Chronic pain    Pain Onset  More than a month ago    Pain Frequency  Constant    Aggravating Factors   no pattern to the pain.  I am just tired    Pain Relieving Factors  Tylenol, meditation app                       [facility] Adult PT Treatment/Exercise - 12/14/19 0001      Manual Therapy   Manual Therapy  Myofascial release;Soft tissue mobilization    Manual therapy comments  soft tissue elongation to bil upper traps, suboccipitals and bil cervical paraspinals and lumbar paraspinals (sidelying) with pt propped on wedge               PT Short Term Goals - 12/14/19 1105      PT SHORT TERM GOAL #1   Title  independent with postural correction exercises to elongate her trunk and separate rib cage from pelvis    Time  3    Period  Weeks    Status  On-going        PT Long Term Goals - 11/28/19 1549      PT LONG TERM GOAL #1   Title  independent with postural strengthening exercises and flexibility exercises to address scoliosis    Time  8    Period  Weeks    Status  New    Target Date  01/23/20      PT LONG TERM GOAL #2   Title  report a 40% reduction in neck and low back pain with sitting    Baseline  --    Time  8    Period  Weeks    Status  New    Target Date  01/23/20      PT LONG TERM GOAL #3   Title  improve cervical A/ROM to 50 degrees bilaterally to improve ability to look to the side with driving    Baseline  --    Time  8    Period  Weeks    Status  New    Target Date  01/23/20            Plan - 12/14/19 1145    Clinical Impression Statement  Pt with first time follow-up after evaluation.  Pt has been able to return to some exercises that she was  regularly performing.  Session focused on manual therapy to address chronic neck and lumbar pain associated with significant scoliosis.  Pt with tension and trigger points in bil lumbar and cervical spine and demonstrated improved tissue mobility and reported reduced symptoms s/p treatment.  Pt will continue to benefit from skilled PT to address chronic pain and scoliosis.    Rehab Potential  Good    PT Frequency  2x / week    PT Duration  8 weeks    PT Treatment/Interventions  ADLs/Self Care Home Management;Cryotherapy;Electrical Stimulation;Moist Heat;Neuromuscular re-education;Therapeutic exercise;Therapeutic activities;Functional mobility training;Patient/family education;Manual techniques;Dry needling;Passive range of motion;Taping    PT Next Visit Plan  dry needling to cervical/lumbar, manual to address tension, gentle flexibility    PT Home Exercise Plan  Access Code: [email or web address]    Consulted and Agree with Plan of Care  Patient       Patient will benefit from skilled therapeutic intervention in order to improve the following deficits and impairments:  Decreased activity tolerance, Decreased mobility, Decreased strength, Postural dysfunction, Improper body mechanics, Impaired flexibility, Pain, Increased muscle spasms, Decreased endurance, Decreased range of motion  Visit Diagnosis: Abnormal posture  Muscle weakness (generalized)  Other idiopathic scoliosis, thoracolumbar region  Muscle spasm of back     Problem List Patient Active Problem List   Diagnosis Date Noted  . Pulmonary hypertension, unspecified ([place]) 04/14/2019  . Ischemic colitis ([place]) 04/14/2019  . Migraine with aura and without status migrainosus, not intractable 11/08/2018  . Degeneration of lumbar intervertebral disc 10/14/2018  . Hoarseness of voice 03/04/2018  . Abdominal pain 07/01/2017  . Diverticulitis, colon   . Metabolic acidosis, increased anion gap   . Constipation 04/14/2017  . Lumbar hernia  04/14/2017  . Presbycusis of both ears 01/10/2017  . Tinnitus aurium, bilateral 01/10/2017  . Gastroesophageal reflux disease 08/20/2016  . Hemoptysis 08/20/2016  . Obstructive sleep apnea of adult 08/20/2016  . Rhinitis, chronic 08/20/2016  . Throat pain in adult 08/20/2016  . Fatigue  12/23/2015  . Sciatica of right side 10/06/2015  . History of migraine headaches 10/06/2015  . Frequent PVCs 12/28/2013  . Premature atrial contractions 12/28/2013  . PSVT (paroxysmal supraventricular tachycardia) ([place]) 12/28/2013  . Heart palpitations 07/13/2013  . Sleep apnea 04/11/2013  . Scoliosis 04/11/2013  . Atrial flutter with rapid ventricular response ([place]) 11/29/2012  . Chest pain, atypical 11/29/2012  . Fibromyalgia syndrome 11/29/2012  . Chronic steroid use 11/29/2012    Sigurd Sos, PT 12/14/19 11:45 AM  [facility] [address] [place], [place], [address] Phone: [phone]   Fax:  [phone]  Name: Janet Nguyen MRN: [id number] Date of Birth: January 13, 1932

## 2019-12-19 ENCOUNTER — Ambulatory Visit: Payer: Medicare Other

## 2019-12-19 ENCOUNTER — Other Ambulatory Visit: Payer: Self-pay

## 2019-12-19 DIAGNOSIS — M6281 Muscle weakness (generalized): Secondary | ICD-10-CM | POA: Diagnosis not present

## 2019-12-19 DIAGNOSIS — R293 Abnormal posture: Secondary | ICD-10-CM | POA: Diagnosis not present

## 2019-12-19 DIAGNOSIS — M542 Cervicalgia: Secondary | ICD-10-CM

## 2019-12-19 DIAGNOSIS — M4125 Other idiopathic scoliosis, thoracolumbar region: Secondary | ICD-10-CM | POA: Diagnosis not present

## 2019-12-19 DIAGNOSIS — M62830 Muscle spasm of back: Secondary | ICD-10-CM

## 2019-12-19 DIAGNOSIS — M545 Low back pain: Secondary | ICD-10-CM | POA: Diagnosis not present

## 2019-12-19 DIAGNOSIS — G8929 Other chronic pain: Secondary | ICD-10-CM | POA: Diagnosis not present

## 2019-12-19 NOTE — Therapy (Signed)
[facility] [address], [place], [place], [address] Phone: [phone]   Fax:  [phone]  Physical Therapy Treatment  Patient Details  Name: Janet Nguyen MRN: [id number] Date of Birth: [birth year] Referring Provider (PT): Melinda Crutch, MD   Encounter Date: 12/19/2019  PT End of Session - 12/19/19 1058    Visit Number  3    Date for PT Re-Evaluation  01/23/20    Authorization Type  Medicare    PT Start Time  1017    PT Stop Time  1056    PT Time Calculation (min)  39 min    Activity Tolerance  Patient tolerated treatment well    Behavior During Therapy  [facility] for tasks assessed/performed       Past Medical History:  Diagnosis Date  . Arrhythmia    History of SVT with documented PVC'S and  PAC'S  12/08/12 Nuc stress test normal LV EF 74%  Event Monitor  12/01/12-01/03/13  . Atrial flutter ([place])   . Celiac disease    treated by Dr. [name]  . GERD (gastroesophageal reflux disease)   . Intervertebral disc stenosis of neural canal of cervical region   . Irregular heart beat 11/30/12   ECHO-EF 60-65%  . Osteoporosis   . PMR (polymyalgia rheumatica) (HCC)    Dr. Marijean Bravo; pt states she was diagnosed 10-15 years ago, not treated at this time or any issues that she is aware of.  . Scoliosis   . Scoliosis   . Sleep apnea 10/02/11 [facility]   Sleep study AHI -total sleep 10.3/hr  64.0/ hr during REM sleep.RDI 22.8/hr during total sleep 64.0/hr during REM sleep The lowest O2 sat during Non-REM and REM sleep was 86% and 88% respectively. 04/08/12 CPAP/BIPAP titration study [facility]    Past Surgical History:  Procedure Laterality Date  . APPENDECTOMY     ruptured at age 89 and had surgery  . CARDIAC CATHETERIZATION  01/27/06  . cataract surgery  2015   Dr. Herbert Deaner; March & April 2015    There were no vitals filed for this visit.  Subjective Assessment - 12/19/19 1019    Subjective  I felt better after last session. I am tired from my sleep apnea.  I am getting a migraine.    Patient Stated Goals  reduce pain and improve mobility to be able to do exercises.    Currently in Pain?  Yes    Pain Score  7     Pain Location  Back   5/10 neck   Pain Orientation  Right;Left;Lower    Pain Onset  More than a month ago    Pain Frequency  Constant    Aggravating Factors   I am tired.  It just hurts    Pain Relieving Factors  Tylenol, manual at PT                       [facility] Adult PT Treatment/Exercise - 12/19/19 0001      Manual Therapy   Manual Therapy  Myofascial release;Soft tissue mobilization    Manual therapy comments  soft tissue elongation to bil upper traps, suboccipitals and bil cervical paraspinals and lumbar paraspinals (sidelying) with pt propped on wedge               PT Short Term Goals - 12/14/19 1105      PT SHORT TERM GOAL #1   Title  independent with postural correction exercises to elongate her trunk and separate rib cage from pelvis    Time  3    Period  Weeks    Status  On-going        PT Long Term Goals - 11/28/19 1549      PT LONG TERM GOAL #1   Title  independent with postural strengthening exercises and flexibility exercises to address scoliosis    Time  8    Period  Weeks    Status  New    Target Date  01/23/20      PT LONG TERM GOAL #2   Title  report a 40% reduction in neck and low back pain with sitting    Baseline  --    Time  8    Period  Weeks    Status  New    Target Date  01/23/20      PT LONG TERM GOAL #3   Title  improve cervical A/ROM to 50 degrees bilaterally to improve ability to look to the side with driving    Baseline  --    Time  8    Period  Weeks    Status  New    Target Date  01/23/20            Plan - 12/19/19 1058    Clinical Impression Statement  Pt had reduction of symptoms after last session.  Pt with multiple medical issues that are causing fatigue and  limited mobility.  Session focused on manual therapy for neck and lumbar spine.  Pt with improved tissue mobility in the neck and Rt lumbar spine s/p session today. Migraine headache was resolved after treatment. Pt was encouraged to stretch as able at home to maximize benefit from manual therapy.  Pt will continue to benefit from skilled PT to address tissue mobility and reduce pain.    PT Treatment/Interventions  ADLs/Self Care Home Management;Cryotherapy;Electrical Stimulation;Moist Heat;Neuromuscular re-education;Therapeutic exercise;Therapeutic activities;Functional mobility training;Patient/family education;Manual techniques;Dry needling;Passive range of motion;Taping    PT Next Visit Plan  dry needling to cervical/lumbar, manual to address tension, gentle flexibility    PT Home Exercise Plan  Access Code: [email or web address]    Consulted and Agree with Plan of Care  Patient       Patient will benefit from skilled therapeutic intervention in order to improve the following deficits and impairments:  Decreased activity tolerance, Decreased mobility, Decreased strength, Postural dysfunction, Improper body mechanics, Impaired flexibility, Pain, Increased muscle spasms, Decreased endurance, Decreased range of motion  Visit Diagnosis: Abnormal posture  Muscle weakness (generalized)  Other idiopathic scoliosis, thoracolumbar region  Muscle spasm of back  Chronic bilateral low back pain without sciatica  Cervicalgia     Problem List Patient Active Problem List   Diagnosis Date Noted  . Pulmonary hypertension, unspecified ([place]) 04/14/2019  . Ischemic colitis ([place]) 04/14/2019  . Migraine with aura and without status migrainosus, not intractable 11/08/2018  . Degeneration of lumbar intervertebral disc 10/14/2018  . Hoarseness of voice 03/04/2018  . Abdominal pain 07/01/2017  . Diverticulitis, colon   . Metabolic acidosis, increased anion gap   . Constipation 04/14/2017  . Lumbar hernia  04/14/2017  . Presbycusis of both ears 01/10/2017  . Tinnitus aurium, bilateral 01/10/2017  . Gastroesophageal reflux disease 08/20/2016  . Hemoptysis 08/20/2016  . Obstructive sleep apnea of adult 08/20/2016  . Rhinitis, chronic 08/20/2016  . Throat pain in adult 08/20/2016  . Fatigue 12/23/2015  . Sciatica of right side  10/06/2015  . History of migraine headaches 10/06/2015  . Frequent PVCs 12/28/2013  . Premature atrial contractions 12/28/2013  . PSVT (paroxysmal supraventricular tachycardia) ([place]) 12/28/2013  . Heart palpitations 07/13/2013  . Sleep apnea 04/11/2013  . Scoliosis 04/11/2013  . Atrial flutter with rapid ventricular response ([place]) 11/29/2012  . Chest pain, atypical 11/29/2012  . Fibromyalgia syndrome 11/29/2012  . Chronic steroid use 11/29/2012     Sigurd Sos, PT 12/19/19 10:59 AM  [facility] [address], [place], [place], [address] Phone: [phone]   Fax:  [phone]  Name: Janet Nguyen MRN: [id number] Date of Birth: [birth year]

## 2019-12-21 ENCOUNTER — Other Ambulatory Visit: Payer: Self-pay

## 2019-12-21 ENCOUNTER — Ambulatory Visit: Payer: Medicare Other

## 2019-12-21 DIAGNOSIS — R293 Abnormal posture: Secondary | ICD-10-CM | POA: Diagnosis not present

## 2019-12-21 DIAGNOSIS — M542 Cervicalgia: Secondary | ICD-10-CM

## 2019-12-21 DIAGNOSIS — M62830 Muscle spasm of back: Secondary | ICD-10-CM | POA: Diagnosis not present

## 2019-12-21 DIAGNOSIS — G8929 Other chronic pain: Secondary | ICD-10-CM | POA: Diagnosis not present

## 2019-12-21 DIAGNOSIS — M545 Low back pain, unspecified: Secondary | ICD-10-CM

## 2019-12-21 DIAGNOSIS — M4125 Other idiopathic scoliosis, thoracolumbar region: Secondary | ICD-10-CM | POA: Diagnosis not present

## 2019-12-21 DIAGNOSIS — M6281 Muscle weakness (generalized): Secondary | ICD-10-CM | POA: Diagnosis not present

## 2019-12-21 NOTE — Therapy (Signed)
[facility] [address] [place], [place], [address] Phone: [phone]   Fax:  [phone]  Physical Therapy Treatment  Patient Details  Name: Janet Nguyen MRN: [id number] Date of Birth: 01-Sep-1932 Referring Provider (PT): Janet Crutch, MD   Encounter Date: 12/21/2019  PT End of Session - 12/21/19 1224    Visit Number  4    Date for PT Re-Evaluation  01/23/20    Authorization Type  Medicare    PT Start Time  1146    PT Stop Time  1225    PT Time Calculation (min)  39 min    Activity Tolerance  Patient tolerated treatment well    Behavior During Therapy  [facility] for tasks assessed/performed       Past Medical History:  Diagnosis Date  . Arrhythmia    History of SVT with documented PVC'S and  PAC'S  12/08/12 Nuc stress test normal LV EF 74%  Event Monitor  12/01/12-01/03/13  . Atrial flutter ([place])   . Celiac disease    treated by Dr. Earlean Nguyen  . GERD (gastroesophageal reflux disease)   . Intervertebral disc stenosis of neural canal of cervical region   . Irregular heart beat 11/30/12   ECHO-EF 60-65%  . Osteoporosis   . PMR (polymyalgia rheumatica) (HCC)    Dr. Marijean Nguyen; pt states she was diagnosed 10-15 years ago, not treated at this time or any issues that she is aware of.  . Scoliosis   . Scoliosis   . Sleep apnea 10/02/11 [facility]   Sleep study AHI -total sleep 10.3/hr  64.0/ hr during REM sleep.RDI 22.8/hr during total sleep 64.0/hr during REM sleep The lowest O2 sat during Non-REM and REM sleep was 86% and 88% respectively. 04/08/12 CPAP/BIPAP titration study [facility]    Past Surgical History:  Procedure Laterality Date  . APPENDECTOMY     ruptured at age 27 and had surgery  . CARDIAC CATHETERIZATION  01/27/06  . cataract surgery  2015   Dr. Herbert Nguyen; March & April 2015    There were no vitals filed for this visit.  Subjective Assessment - 12/21/19 1147    Subjective  I really felt good after last session.  It helped me to stop the migraine that was starting before I got.    Currently in Pain?  Yes    Pain Score  6     Pain Location  Back   back and neck                      [facility] Adult PT Treatment/Exercise - 12/21/19 0001      Manual Therapy   Manual Therapy  Myofascial release;Soft tissue mobilization    Manual therapy comments  soft tissue elongation to bil upper traps, suboccipitals and bil cervical paraspinals and lumbar paraspinals (sidelying) with pt propped on wedge               PT Short Term Goals - 12/21/19 1149      PT SHORT TERM GOAL #1   Title  independent with postural correction exercises to elongate her trunk and separate rib cage from pelvis    Status  Achieved        PT Long Term Goals - 11/28/19 1549      PT LONG TERM GOAL #1   Title  independent with postural strengthening exercises and flexibility exercises to address scoliosis    Time  8    Period  Weeks    Status  New    Target Date  01/23/20      PT LONG TERM GOAL #2   Title  report a 40% reduction in neck and low back pain with sitting    Baseline  --    Time  8    Period  Weeks    Status  New    Target Date  01/23/20      PT LONG TERM GOAL #3   Title  improve cervical A/ROM to 50 degrees bilaterally to improve ability to look to the side with driving    Baseline  --    Time  8    Period  Weeks    Status  New    Target Date  01/23/20            Plan - 12/21/19 1153    Clinical Impression Statement  Pt had reduction of symptoms after last session.  Pt with multiple medical issues that are causing fatigue and limited mobility.  Session focused on manual therapy for neck and lumbar spine.  Pt with improved tissue mobility in the neck and Rt lumbar spine s/p session today.  Pt is working on elongation at home to increased space between pelvis and ribs. Pt was encouraged to stretch as able at home to maximize benefit  from manual therapy.  Pt will continue to benefit from skilled PT to address tissue mobility and reduce pain.    PT Frequency  2x / week    PT Duration  8 weeks    PT Treatment/Interventions  ADLs/Self Care Home Management;Cryotherapy;Electrical Stimulation;Moist Heat;Neuromuscular re-education;Therapeutic exercise;Therapeutic activities;Functional mobility training;Patient/family education;Manual techniques;Dry needling;Passive range of motion;Taping    PT Next Visit Plan  dry needling to cervical/lumbar, manual to address tension, gentle flexibility    PT Home Exercise Plan  Access Code: [email or web address]    Consulted and Agree with Plan of Care  Patient       Patient will benefit from skilled therapeutic intervention in order to improve the following deficits and impairments:  Decreased activity tolerance, Decreased mobility, Decreased strength, Postural dysfunction, Improper body mechanics, Impaired flexibility, Pain, Increased muscle spasms, Decreased endurance, Decreased range of motion  Visit Diagnosis: Muscle weakness (generalized)  Abnormal posture  Other idiopathic scoliosis, thoracolumbar region  Muscle spasm of back  Chronic bilateral low back pain without sciatica  Cervicalgia     Problem List Patient Active Problem List   Diagnosis Date Noted  . Pulmonary hypertension, unspecified ([place]) 04/14/2019  . Ischemic colitis ([place]) 04/14/2019  . Migraine with aura and without status migrainosus, not intractable 11/08/2018  . Degeneration of lumbar intervertebral disc 10/14/2018  . Hoarseness of voice 03/04/2018  . Abdominal pain 07/01/2017  . Diverticulitis, colon   . Metabolic acidosis, increased anion gap   . Constipation 04/14/2017  . Lumbar hernia 04/14/2017  . Presbycusis of both ears 01/10/2017  . Tinnitus aurium, bilateral 01/10/2017  . Gastroesophageal reflux disease 08/20/2016  . Hemoptysis 08/20/2016  . Obstructive sleep apnea of adult 08/20/2016  . Rhinitis,  chronic 08/20/2016  . Throat pain in adult 08/20/2016  . Fatigue 12/23/2015  . Sciatica of right side 10/06/2015  . History of migraine headaches 10/06/2015  . Frequent PVCs 12/28/2013  . Premature atrial contractions 12/28/2013  . PSVT (paroxysmal supraventricular tachycardia) ([place]) 12/28/2013  . Heart palpitations 07/13/2013  . Sleep apnea 04/11/2013  . Scoliosis 04/11/2013  . Atrial flutter with rapid ventricular response ([place]) 11/29/2012  .  Chest pain, atypical 11/29/2012  . Fibromyalgia syndrome 11/29/2012  . Chronic steroid use 11/29/2012     Sigurd Sos, PT 12/21/19 12:26 PM  [facility] [address] [place], [place], [address] Phone: [phone]   Fax:  [phone]  Name: Janet Nguyen MRN: [id number] Date of Birth: 09-13-1932

## 2019-12-28 ENCOUNTER — Other Ambulatory Visit: Payer: Self-pay

## 2019-12-28 ENCOUNTER — Ambulatory Visit: Payer: Medicare Other

## 2019-12-28 DIAGNOSIS — M62830 Muscle spasm of back: Secondary | ICD-10-CM | POA: Diagnosis not present

## 2019-12-28 DIAGNOSIS — R293 Abnormal posture: Secondary | ICD-10-CM | POA: Diagnosis not present

## 2019-12-28 DIAGNOSIS — M6281 Muscle weakness (generalized): Secondary | ICD-10-CM | POA: Diagnosis not present

## 2019-12-28 DIAGNOSIS — M545 Low back pain: Secondary | ICD-10-CM | POA: Diagnosis not present

## 2019-12-28 DIAGNOSIS — M4125 Other idiopathic scoliosis, thoracolumbar region: Secondary | ICD-10-CM

## 2019-12-28 DIAGNOSIS — G8929 Other chronic pain: Secondary | ICD-10-CM

## 2019-12-28 DIAGNOSIS — M542 Cervicalgia: Secondary | ICD-10-CM

## 2019-12-28 NOTE — Patient Instructions (Signed)
Access Code: [email or web address] URL: [email or web address] Date: 12/28/2019 Prepared by: [name]  Exercises Supine Transversus Abdominis Bracing - Hands on Stomach - 2 x daily - 7 x weekly - 1 sets - 10 reps - 5 hold Standing Quadratus Lumborum Stretch with Doorway - 2 x daily - 7 x weekly - 1 sets - 3 reps - 10 hold Supine Lower Trunk Rotation - 3 x daily - 7 x weekly - 1 sets - 3 reps - 20 hold Prone Press Up on Elbows - 2 x daily - 7 x weekly - 1 sets - 10 reps Standing Lumbar Extension - 2 x daily - 7 x weekly - 1 sets - 10 reps - 5 hold

## 2019-12-28 NOTE — Therapy (Signed)
[facility] [address], [place], [place], [address] Phone: [phone]   Fax:  [phone]  Physical Therapy Treatment  Patient Details  Name: Janet Nguyen MRN: [id number] Date of Birth: March 25, 1932 Referring Provider (PT): Melinda Crutch, MD   Encounter Date: 12/28/2019  PT End of Session - 12/28/19 1147    Visit Number  5    Date for PT Re-Evaluation  01/23/20    Authorization Type  Medicare    PT Start Time  1102    PT Stop Time  1145    PT Time Calculation (min)  43 min    Activity Tolerance  Patient tolerated treatment well    Behavior During Therapy  [facility] for tasks assessed/performed       Past Medical History:  Diagnosis Date  . Arrhythmia    History of SVT with documented PVC'S and  PAC'S  12/08/12 Nuc stress test normal LV EF 74%  Event Monitor  12/01/12-01/03/13  . Atrial flutter ([place])   . Celiac disease    treated by Dr. [name]  . GERD (gastroesophageal reflux disease)   . Intervertebral disc stenosis of neural canal of cervical region   . Irregular heart beat 11/30/12   ECHO-EF 60-65%  . Osteoporosis   . PMR (polymyalgia rheumatica) (HCC)    Dr. Marijean Bravo; pt states she was diagnosed 10-15 years ago, not treated at this time or any issues that she is aware of.  . Scoliosis   . Scoliosis   . Sleep apnea 10/02/11 [facility]   Sleep study AHI -total sleep 10.3/hr  64.0/ hr during REM sleep.RDI 22.8/hr during total sleep 64.0/hr during REM sleep The lowest O2 sat during Non-REM and REM sleep was 86% and 88% respectively. 04/08/12 CPAP/BIPAP titration study [facility]    Past Surgical History:  Procedure Laterality Date  . APPENDECTOMY     ruptured at age 77 and had surgery  . CARDIAC CATHETERIZATION  01/27/06  . cataract surgery  2015   Dr. Herbert Deaner; March & April 2015    There were no vitals filed for this visit.  Subjective Assessment - 12/28/19 1105     Subjective  I feel good for a few days after I am here.  The effects are lasting longer.  I want more stretches to work on creating space between my ribs and pelvis.    Pertinent History  migraines, significant scoliosis, polymyalgia rheumatica, sleep apnea    Currently in Pain?  Yes    Pain Score  5     Pain Location  Back    Pain Orientation  Left    Pain Onset  More than a month ago    Aggravating Factors   it just hurts    Pain Relieving Factors  Therapy, tylenol                       [facility] Adult PT Treatment/Exercise - 12/28/19 0001      Manual Therapy   Manual Therapy  Myofascial release;Soft tissue mobilization    Manual therapy comments  soft tissue elongation to bil upper traps, suboccipitals and bil cervical paraspinals and lumbar paraspinals (sidelying) with pt propped on wedge             PT Education - 12/28/19 1111    Education Details  Access Code: [email or web address]    Person(s) Educated  Patient    Methods  Explanation;Demonstration;Handout  Comprehension  Verbalized understanding       PT Short Term Goals - 12/21/19 1149      PT SHORT TERM GOAL #1   Title  independent with postural correction exercises to elongate her trunk and separate rib cage from pelvis    Status  Achieved        PT Long Term Goals - 11/28/19 1549      PT LONG TERM GOAL #1   Title  independent with postural strengthening exercises and flexibility exercises to address scoliosis    Time  8    Period  Weeks    Status  New    Target Date  01/23/20      PT LONG TERM GOAL #2   Title  report a 40% reduction in neck and low back pain with sitting    Baseline  --    Time  8    Period  Weeks    Status  New    Target Date  01/23/20      PT LONG TERM GOAL #3   Title  improve cervical A/ROM to 50 degrees bilaterally to improve ability to look to the side with driving    Baseline  --    Time  8    Period  Weeks    Status  New    Target Date  01/23/20             Plan - 12/28/19 1147    Clinical Impression Statement  Pt responds well to manual therapy with improved alignment and mobility after.  This effect is longer each session.  PT issued HEP to promote elongation of Lt quadratus.  Pt with significant scoliosis that challenges her alignment.  Pt has been able to do more exercise at home.  Pt reports reduced pain post session.  Pt will continue to benefit from skilled PT to address chronic pain, alignment and flexibility.    PT Frequency  2x / week    PT Duration  8 weeks    PT Treatment/Interventions  ADLs/Self Care Home Management;Cryotherapy;Electrical Stimulation;Moist Heat;Neuromuscular re-education;Therapeutic exercise;Therapeutic activities;Functional mobility training;Patient/family education;Manual techniques;Dry needling;Passive range of motion;Taping    PT Next Visit Plan  follow-up on new exercises, continue manaul therapy    PT Home Exercise Plan  Access Code: [email or web address]    Consulted and Agree with Plan of Care  Patient       Patient will benefit from skilled therapeutic intervention in order to improve the following deficits and impairments:  Decreased activity tolerance, Decreased mobility, Decreased strength, Postural dysfunction, Improper body mechanics, Impaired flexibility, Pain, Increased muscle spasms, Decreased endurance, Decreased range of motion  Visit Diagnosis: Abnormal posture  Muscle weakness (generalized)  Other idiopathic scoliosis, thoracolumbar region  Muscle spasm of back  Chronic bilateral low back pain without sciatica  Cervicalgia     Problem List Patient Active Problem List   Diagnosis Date Noted  . Pulmonary hypertension, unspecified ([place]) 04/14/2019  . Ischemic colitis ([place]) 04/14/2019  . Migraine with aura and without status migrainosus, not intractable 11/08/2018  . Degeneration of lumbar intervertebral disc 10/14/2018  . Hoarseness of voice 03/04/2018  . Abdominal pain 07/01/2017   . Diverticulitis, colon   . Metabolic acidosis, increased anion gap   . Constipation 04/14/2017  . Lumbar hernia 04/14/2017  . Presbycusis of both ears 01/10/2017  . Tinnitus aurium, bilateral 01/10/2017  . Gastroesophageal reflux disease 08/20/2016  . Hemoptysis 08/20/2016  . Obstructive sleep apnea of adult 08/20/2016  .  Rhinitis, chronic 08/20/2016  . Throat pain in adult 08/20/2016  . Fatigue 12/23/2015  . Sciatica of right side 10/06/2015  . History of migraine headaches 10/06/2015  . Frequent PVCs 12/28/2013  . Premature atrial contractions 12/28/2013  . PSVT (paroxysmal supraventricular tachycardia) ([place]) 12/28/2013  . Heart palpitations 07/13/2013  . Sleep apnea 04/11/2013  . Scoliosis 04/11/2013  . Atrial flutter with rapid ventricular response ([place]) 11/29/2012  . Chest pain, atypical 11/29/2012  . Fibromyalgia syndrome 11/29/2012  . Chronic steroid use 11/29/2012    Sigurd Sos, PT 12/28/19 11:50 AM  [facility] [address], [place], [place], [address] Phone: [phone]   Fax:  [phone]  Name: FERMINA MISHKIN MRN: [id number] Date of Birth: 1932-06-18

## 2020-01-02 ENCOUNTER — Ambulatory Visit: Payer: Medicare Other | Attending: Family Medicine

## 2020-01-02 ENCOUNTER — Other Ambulatory Visit: Payer: Self-pay

## 2020-01-02 DIAGNOSIS — G8929 Other chronic pain: Secondary | ICD-10-CM | POA: Diagnosis not present

## 2020-01-02 DIAGNOSIS — M4125 Other idiopathic scoliosis, thoracolumbar region: Secondary | ICD-10-CM | POA: Diagnosis not present

## 2020-01-02 DIAGNOSIS — R293 Abnormal posture: Secondary | ICD-10-CM | POA: Insufficient documentation

## 2020-01-02 DIAGNOSIS — M542 Cervicalgia: Secondary | ICD-10-CM | POA: Diagnosis not present

## 2020-01-02 DIAGNOSIS — M6281 Muscle weakness (generalized): Secondary | ICD-10-CM | POA: Insufficient documentation

## 2020-01-02 DIAGNOSIS — M62830 Muscle spasm of back: Secondary | ICD-10-CM | POA: Insufficient documentation

## 2020-01-02 DIAGNOSIS — M545 Low back pain: Secondary | ICD-10-CM | POA: Diagnosis not present

## 2020-01-02 NOTE — Therapy (Signed)
[facility] [address], [place], [place], [address] Phone: [phone]   Fax:  [phone]  Physical Therapy Treatment  Patient Details  Name: Janet Nguyen MRN: [id number] Date of Birth: [birth year] Referring Provider (PT): Melinda Crutch, MD   Encounter Date: 01/02/2020  PT End of Session - 01/02/20 1146    Visit Number  6    Date for PT Re-Evaluation  01/23/20    Authorization Type  Medicare    PT Start Time  1103    PT Stop Time  1144    PT Time Calculation (min)  41 min    Activity Tolerance  Patient tolerated treatment well    Behavior During Therapy  [facility] for tasks assessed/performed       Past Medical History:  Diagnosis Date  . Arrhythmia    History of SVT with documented PVC'S and  PAC'S  12/08/12 Nuc stress test normal LV EF 74%  Event Monitor  12/01/12-01/03/13  . Atrial flutter ([place])   . Celiac disease    treated by Dr. [name]  . GERD (gastroesophageal reflux disease)   . Intervertebral disc stenosis of neural canal of cervical region   . Irregular heart beat 11/30/12   ECHO-EF 60-65%  . Osteoporosis   . PMR (polymyalgia rheumatica) (HCC)    Dr. Marijean Bravo; pt states she was diagnosed 10-15 years ago, not treated at this time or any issues that she is aware of.  . Scoliosis   . Scoliosis   . Sleep apnea 10/02/11 [facility]   Sleep study AHI -total sleep 10.3/hr  64.0/ hr during REM sleep.RDI 22.8/hr during total sleep 64.0/hr during REM sleep The lowest O2 sat during Non-REM and REM sleep was 86% and 88% respectively. 04/08/12 CPAP/BIPAP titration study [facility]    Past Surgical History:  Procedure Laterality Date  . APPENDECTOMY     ruptured at age 87 and had surgery  . CARDIAC CATHETERIZATION  01/27/06  . cataract surgery  2015   Dr. Herbert Deaner; March & April 2015    There were no vitals filed for this visit.  Subjective Assessment - 01/02/20 1105    Subjective  I felt so good after last session.  I am able to stand taller more frequently    Currently in Pain?  Yes    Pain Score  5     Pain Location  Back    Pain Orientation  Left    Pain Descriptors / Indicators  Aching    Pain Type  Chronic pain    Pain Onset  More than a month ago    Pain Frequency  Constant                       [facility] Adult PT Treatment/Exercise - 01/02/20 0001      Manual Therapy   Manual Therapy  Myofascial release;Soft tissue mobilization    Manual therapy comments  soft tissue elongation to bil upper traps, suboccipitals and bil cervical paraspinals and lumbar paraspinals (sidelying) with pt propped on wedge               PT Short Term Goals - 12/21/19 1149      PT SHORT TERM GOAL #1   Title  independent with postural correction exercises to elongate her trunk and separate rib cage from pelvis    Status  Achieved        PT Long Term  Goals - 11/28/19 1549      PT LONG TERM GOAL #1   Title  independent with postural strengthening exercises and flexibility exercises to address scoliosis    Time  8    Period  Weeks    Status  New    Target Date  01/23/20      PT LONG TERM GOAL #2   Title  report a 40% reduction in neck and low back pain with sitting    Baseline  --    Time  8    Period  Weeks    Status  New    Target Date  01/23/20      PT LONG TERM GOAL #3   Title  improve cervical A/ROM to 50 degrees bilaterally to improve ability to look to the side with driving    Baseline  --    Time  8    Period  Weeks    Status  New    Target Date  01/23/20            Plan - 01/02/20 1145    Clinical Impression Statement  Pt is demonstrating improved posture each session upon entry.  Pt reports longer relief after tissue elongation each session.  Pt with tension over Rt lumbar and thoracic paraspinals today.  Pt is independent in HEP for flexibility and elongation.  Pt will continue to benefit from skilled PT to  address flexibility, strength and manual therapy to address chronic pain and alignment issues.    PT Frequency  2x / week    PT Duration  8 weeks    PT Treatment/Interventions  ADLs/Self Care Home Management;Cryotherapy;Electrical Stimulation;Moist Heat;Neuromuscular re-education;Therapeutic exercise;Therapeutic activities;Functional mobility training;Patient/family education;Manual techniques;Dry needling;Passive range of motion;Taping    PT Next Visit Plan  follow-up on new exercises, continue manaul therapy    PT Home Exercise Plan  Access Code: [email or web address]       Patient will benefit from skilled therapeutic intervention in order to improve the following deficits and impairments:  Decreased activity tolerance, Decreased mobility, Decreased strength, Postural dysfunction, Improper body mechanics, Impaired flexibility, Pain, Increased muscle spasms, Decreased endurance, Decreased range of motion  Visit Diagnosis: Muscle weakness (generalized)  Abnormal posture  Other idiopathic scoliosis, thoracolumbar region  Muscle spasm of back  Chronic bilateral low back pain without sciatica  Cervicalgia     Problem List Patient Active Problem List   Diagnosis Date Noted  . Pulmonary hypertension, unspecified ([place]) 04/14/2019  . Ischemic colitis ([place]) 04/14/2019  . Migraine with aura and without status migrainosus, not intractable 11/08/2018  . Degeneration of lumbar intervertebral disc 10/14/2018  . Hoarseness of voice 03/04/2018  . Abdominal pain 07/01/2017  . Diverticulitis, colon   . Metabolic acidosis, increased anion gap   . Constipation 04/14/2017  . Lumbar hernia 04/14/2017  . Presbycusis of both ears 01/10/2017  . Tinnitus aurium, bilateral 01/10/2017  . Gastroesophageal reflux disease 08/20/2016  . Hemoptysis 08/20/2016  . Obstructive sleep apnea of adult 08/20/2016  . Rhinitis, chronic 08/20/2016  . Throat pain in adult 08/20/2016  . Fatigue 12/23/2015  . Sciatica of  right side 10/06/2015  . History of migraine headaches 10/06/2015  . Frequent PVCs 12/28/2013  . Premature atrial contractions 12/28/2013  . PSVT (paroxysmal supraventricular tachycardia) ([place]) 12/28/2013  . Heart palpitations 07/13/2013  . Sleep apnea 04/11/2013  . Scoliosis 04/11/2013  . Atrial flutter with rapid ventricular response ([place]) 11/29/2012  . Chest pain, atypical 11/29/2012  . Fibromyalgia syndrome 11/29/2012  .  Chronic steroid use 11/29/2012     Sigurd Sos, PT 01/02/20 11:47 AM  [facility] [address] [place], [place], [address] Phone: [phone]   Fax:  [phone]  Name: VIOLET SEABURY MRN: [id number] Date of Birth: [birth year]

## 2020-01-03 ENCOUNTER — Telehealth: Payer: Self-pay | Admitting: Cardiovascular Disease

## 2020-01-03 NOTE — Telephone Encounter (Signed)
Pt c/o of Chest Pain: STAT if CP now or developed within 24 hours  1. Are you having CP right now? no  2. Are you experiencing any other symptoms (ex. SOB, nausea, vomiting, sweating)? no  3. How long have you been experiencing CP? A long time  4. Is your CP continuous or coming and going? Comes and goes   5. Have you taken Nitroglycerin? No   Patient states her chest tightness is getting worse and happens daily now. She says it happens every morning, but once she is up and moving it lessens. She states it has been making her very fatigued. She has an appointment with Dr. [name] 01/12/2020.

## 2020-01-03 NOTE — Telephone Encounter (Signed)
Spoke with patient. Patient denies chest pain. Patient states she just wanted to see Dr. [name] and an appointment was made. No concerns or questions at this time.

## 2020-01-04 ENCOUNTER — Ambulatory Visit: Payer: Medicare Other

## 2020-01-04 ENCOUNTER — Other Ambulatory Visit: Payer: Self-pay

## 2020-01-04 DIAGNOSIS — G8929 Other chronic pain: Secondary | ICD-10-CM | POA: Diagnosis not present

## 2020-01-04 DIAGNOSIS — M545 Low back pain: Secondary | ICD-10-CM | POA: Diagnosis not present

## 2020-01-04 DIAGNOSIS — M6281 Muscle weakness (generalized): Secondary | ICD-10-CM

## 2020-01-04 DIAGNOSIS — M4125 Other idiopathic scoliosis, thoracolumbar region: Secondary | ICD-10-CM | POA: Diagnosis not present

## 2020-01-04 DIAGNOSIS — M62830 Muscle spasm of back: Secondary | ICD-10-CM | POA: Diagnosis not present

## 2020-01-04 DIAGNOSIS — R293 Abnormal posture: Secondary | ICD-10-CM

## 2020-01-04 DIAGNOSIS — M542 Cervicalgia: Secondary | ICD-10-CM

## 2020-01-04 DIAGNOSIS — K219 Gastro-esophageal reflux disease without esophagitis: Secondary | ICD-10-CM | POA: Diagnosis not present

## 2020-01-04 NOTE — Therapy (Signed)
[facility] [address] [place], [place], [address] Phone: [phone]   Fax:  [phone]  Physical Therapy Treatment  Patient Details  Name: Janet Nguyen MRN: [id number] Date of Birth: 1931/12/13 Referring Provider (PT): Melinda Crutch, MD   Encounter Date: 01/04/2020  PT End of Session - 01/04/20 1148    Visit Number  7    Date for PT Re-Evaluation  01/23/20    Authorization Type  Medicare    PT Start Time  1108   increased time for positioning   PT Stop Time  1143    PT Time Calculation (min)  35 min    Activity Tolerance  Patient tolerated treatment well    Behavior During Therapy  [facility] for tasks assessed/performed       Past Medical History:  Diagnosis Date  . Arrhythmia    History of SVT with documented PVC'S and  PAC'S  12/08/12 Nuc stress test normal LV EF 74%  Event Monitor  12/01/12-01/03/13  . Atrial flutter ([place])   . Celiac disease    treated by Dr. [name]  . GERD (gastroesophageal reflux disease)   . Intervertebral disc stenosis of neural canal of cervical region   . Irregular heart beat 11/30/12   ECHO-EF 60-65%  . Osteoporosis   . PMR (polymyalgia rheumatica) (HCC)    Dr. Marijean Bravo; pt states she was diagnosed 10-15 years ago, not treated at this time or any issues that she is aware of.  . Scoliosis   . Scoliosis   . Sleep apnea 10/02/11 [facility]   Sleep study AHI -total sleep 10.3/hr  64.0/ hr during REM sleep.RDI 22.8/hr during total sleep 64.0/hr during REM sleep The lowest O2 sat during Non-REM and REM sleep was 86% and 88% respectively. 04/08/12 CPAP/BIPAP titration study [facility]    Past Surgical History:  Procedure Laterality Date  . APPENDECTOMY     ruptured at age 6 and had surgery  . CARDIAC CATHETERIZATION  01/27/06  . cataract surgery  2015   Dr. Herbert Deaner; March & April 2015    There were no vitals filed for this visit.  Subjective  Assessment - 01/04/20 1151    Subjective  I am feeling better each time.    Currently in Pain?  Yes    Pain Score  5     Pain Location  Back    Pain Orientation  Left    Pain Descriptors / Indicators  Aching    Pain Type  Chronic pain    Pain Onset  More than a month ago    Pain Frequency  Constant                       [facility] Adult PT Treatment/Exercise - 01/04/20 0001      Manual Therapy   Manual Therapy  Myofascial release;Soft tissue mobilization    Manual therapy comments  soft tissue elongation to bil upper traps, suboccipitals and bil cervical paraspinals and lumbar paraspinals (sidelying) with pt propped on wedge               PT Short Term Goals - 12/21/19 1149      PT SHORT TERM GOAL #1   Title  independent with postural correction exercises to elongate her trunk and separate rib cage from pelvis    Status  Achieved        PT Long Term Goals - 11/28/19 1549  PT LONG TERM GOAL #1   Title  independent with postural strengthening exercises and flexibility exercises to address scoliosis    Time  8    Period  Weeks    Status  New    Target Date  01/23/20      PT LONG TERM GOAL #2   Title  report a 40% reduction in neck and low back pain with sitting    Baseline  --    Time  8    Period  Weeks    Status  New    Target Date  01/23/20      PT LONG TERM GOAL #3   Title  improve cervical A/ROM to 50 degrees bilaterally to improve ability to look to the side with driving    Baseline  --    Time  8    Period  Weeks    Status  New    Target Date  01/23/20            Plan - 01/04/20 1147    Clinical Impression Statement  Pt is demonstrating improved posture each session upon entry.  Pt reports longer relief after tissue elongation each session.  Pt with tension over Rt lumbar and thoracic paraspinals today and tension in Lt quadratus.  Pt is independent in HEP for flexibility and elongation.  Pt will continue to benefit from skilled  PT to address flexibility, strength and manual therapy to address chronic pain and alignment issues.    Rehab Potential  Good    PT Frequency  2x / week    PT Duration  8 weeks    PT Treatment/Interventions  ADLs/Self Care Home Management;Cryotherapy;Electrical Stimulation;Moist Heat;Neuromuscular re-education;Therapeutic exercise;Therapeutic activities;Functional mobility training;Patient/family education;Manual techniques;Dry needling;Passive range of motion;Taping    PT Next Visit Plan  follow-up on new exercises, continue manaul therapy to elongate    PT Home Exercise Plan  Access Code: [email or web address]    Consulted and Agree with Plan of Care  Patient       Patient will benefit from skilled therapeutic intervention in order to improve the following deficits and impairments:  Decreased activity tolerance, Decreased mobility, Decreased strength, Postural dysfunction, Improper body mechanics, Impaired flexibility, Pain, Increased muscle spasms, Decreased endurance, Decreased range of motion  Visit Diagnosis: Abnormal posture  Other idiopathic scoliosis, thoracolumbar region  Muscle weakness (generalized)  Muscle spasm of back  Chronic bilateral low back pain without sciatica  Cervicalgia     Problem List Patient Active Problem List   Diagnosis Date Noted  . Pulmonary hypertension, unspecified ([place]) 04/14/2019  . Ischemic colitis ([place]) 04/14/2019  . Migraine with aura and without status migrainosus, not intractable 11/08/2018  . Degeneration of lumbar intervertebral disc 10/14/2018  . Hoarseness of voice 03/04/2018  . Abdominal pain 07/01/2017  . Diverticulitis, colon   . Metabolic acidosis, increased anion gap   . Constipation 04/14/2017  . Lumbar hernia 04/14/2017  . Presbycusis of both ears 01/10/2017  . Tinnitus aurium, bilateral 01/10/2017  . Gastroesophageal reflux disease 08/20/2016  . Hemoptysis 08/20/2016  . Obstructive sleep apnea of adult 08/20/2016  . Rhinitis,  chronic 08/20/2016  . Throat pain in adult 08/20/2016  . Fatigue 12/23/2015  . Sciatica of right side 10/06/2015  . History of migraine headaches 10/06/2015  . Frequent PVCs 12/28/2013  . Premature atrial contractions 12/28/2013  . PSVT (paroxysmal supraventricular tachycardia) ([place]) 12/28/2013  . Heart palpitations 07/13/2013  . Sleep apnea 04/11/2013  . Scoliosis 04/11/2013  . Atrial flutter with  rapid ventricular response ([place]) 11/29/2012  . Chest pain, atypical 11/29/2012  . Fibromyalgia syndrome 11/29/2012  . Chronic steroid use 11/29/2012     Sigurd Sos, PT 01/04/20 11:53 AM  [facility] [address] [place], [place], [address] Phone: [phone]   Fax:  [phone]  Name: Janet Nguyen MRN: [id number] Date of Birth: 06-07-1932

## 2020-01-09 ENCOUNTER — Ambulatory Visit: Payer: Medicare Other

## 2020-01-09 ENCOUNTER — Other Ambulatory Visit: Payer: Self-pay

## 2020-01-09 DIAGNOSIS — M62830 Muscle spasm of back: Secondary | ICD-10-CM | POA: Diagnosis not present

## 2020-01-09 DIAGNOSIS — G8929 Other chronic pain: Secondary | ICD-10-CM | POA: Diagnosis not present

## 2020-01-09 DIAGNOSIS — M6281 Muscle weakness (generalized): Secondary | ICD-10-CM

## 2020-01-09 DIAGNOSIS — M4125 Other idiopathic scoliosis, thoracolumbar region: Secondary | ICD-10-CM | POA: Diagnosis not present

## 2020-01-09 DIAGNOSIS — M545 Low back pain: Secondary | ICD-10-CM | POA: Diagnosis not present

## 2020-01-09 DIAGNOSIS — M542 Cervicalgia: Secondary | ICD-10-CM

## 2020-01-09 DIAGNOSIS — R293 Abnormal posture: Secondary | ICD-10-CM

## 2020-01-09 NOTE — Therapy (Signed)
[facility] [address], [place], [place], [address] Phone: [phone]   Fax:  [phone]  Physical Therapy Treatment  Patient Details  Name: Janet Nguyen MRN: [id number] Date of Birth: 08-26-1932 Referring Provider (PT): Melinda Crutch, MD   Encounter Date: 01/09/2020  PT End of Session - 01/09/20 1143    Visit Number  8    Date for PT Re-Evaluation  01/23/20    Authorization Type  Medicare    PT Start Time  1104    PT Stop Time  1143    PT Time Calculation (min)  39 min    Activity Tolerance  Patient tolerated treatment well    Behavior During Therapy  [facility] for tasks assessed/performed       Past Medical History:  Diagnosis Date  . Arrhythmia    History of SVT with documented PVC'S and  PAC'S  12/08/12 Nuc stress test normal LV EF 74%  Event Monitor  12/01/12-01/03/13  . Atrial flutter ([place])   . Celiac disease    treated by Dr. [name]  . GERD (gastroesophageal reflux disease)   . Intervertebral disc stenosis of neural canal of cervical region   . Irregular heart beat 11/30/12   ECHO-EF 60-65%  . Osteoporosis   . PMR (polymyalgia rheumatica) (HCC)    Dr. Marijean Bravo; pt states she was diagnosed 10-15 years ago, not treated at this time or any issues that she is aware of.  . Scoliosis   . Scoliosis   . Sleep apnea 10/02/11 [facility]   Sleep study AHI -total sleep 10.3/hr  64.0/ hr during REM sleep.RDI 22.8/hr during total sleep 64.0/hr during REM sleep The lowest O2 sat during Non-REM and REM sleep was 86% and 88% respectively. 04/08/12 CPAP/BIPAP titration study [facility]    Past Surgical History:  Procedure Laterality Date  . APPENDECTOMY     ruptured at age 86 and had surgery  . CARDIAC CATHETERIZATION  01/27/06  . cataract surgery  2015   Dr. Herbert Deaner; March & April 2015    There were no vitals filed for this visit.  Subjective Assessment - 01/09/20 1104     Subjective  I started hurting in my left low back yesterday.  I felt good for a few days after last session and I am able to be more upright.    Currently in Pain?  Yes    Pain Score  7    over the weekend 4/10   Pain Location  Back    Pain Orientation  Left    Pain Descriptors / Indicators  Aching;Sore    Pain Type  Chronic pain    Pain Onset  More than a month ago    Pain Frequency  Constant    Aggravating Factors   overdoing it    Pain Relieving Factors  therapy, Tylenol                       [facility] Adult PT Treatment/Exercise - 01/09/20 0001      Manual Therapy   Manual Therapy  Myofascial release;Soft tissue mobilization    Manual therapy comments  soft tissue elongation to bil upper traps, suboccipitals and bil cervical paraspinals and lumbar paraspinals (sidelying) with pt propped on wedge               PT Short Term Goals - 01/09/20 1106      PT SHORT TERM GOAL #  1   Title  independent with postural correction exercises to elongate her trunk and separate rib cage from pelvis    Status  Achieved      PT SHORT TERM GOAL #2   Title  report a 20% reduction in LBP and neck pain with sitting        PT Long Term Goals - 01/09/20 1106      PT LONG TERM GOAL #1   Title  independent with postural strengthening exercises and flexibility exercises to address scoliosis    Status  Achieved            Plan - 01/09/20 1113    Clinical Impression Statement  Pt is responding well with manual therapy in the clinic and elongation exercises at home to address scoliosis and muscle tension.  Pt has varied levels of pain that is based on activity.  Pt is modifying activity to reduce long periods of sitting at the computer. Pt reports 50% overall improvement in ability to stand straight, move with improved mobility. Pt has chronic condition and is managing symptoms with manual therapy and flexibility.    PT Frequency  2x / week    PT Duration  8 weeks    PT  Treatment/Interventions  ADLs/Self Care Home Management;Cryotherapy;Electrical Stimulation;Moist Heat;Neuromuscular re-education;Therapeutic exercise;Therapeutic activities;Functional mobility training;Patient/family education;Manual techniques;Dry needling;Passive range of motion;Taping    PT Next Visit Plan  follow-up on new exercises, continue manaul therapy to elongate    PT Home Exercise Plan  Access Code: [email or web address]    Consulted and Agree with Plan of Care  Patient       Patient will benefit from skilled therapeutic intervention in order to improve the following deficits and impairments:  Decreased activity tolerance, Decreased mobility, Decreased strength, Postural dysfunction, Improper body mechanics, Impaired flexibility, Pain, Increased muscle spasms, Decreased endurance, Decreased range of motion  Visit Diagnosis: Abnormal posture  Other idiopathic scoliosis, thoracolumbar region  Muscle weakness (generalized)  Muscle spasm of back  Chronic bilateral low back pain without sciatica  Cervicalgia     Problem List Patient Active Problem List   Diagnosis Date Noted  . Pulmonary hypertension, unspecified ([place]) 04/14/2019  . Ischemic colitis ([place]) 04/14/2019  . Migraine with aura and without status migrainosus, not intractable 11/08/2018  . Degeneration of lumbar intervertebral disc 10/14/2018  . Hoarseness of voice 03/04/2018  . Abdominal pain 07/01/2017  . Diverticulitis, colon   . Metabolic acidosis, increased anion gap   . Constipation 04/14/2017  . Lumbar hernia 04/14/2017  . Presbycusis of both ears 01/10/2017  . Tinnitus aurium, bilateral 01/10/2017  . Gastroesophageal reflux disease 08/20/2016  . Hemoptysis 08/20/2016  . Obstructive sleep apnea of adult 08/20/2016  . Rhinitis, chronic 08/20/2016  . Throat pain in adult 08/20/2016  . Fatigue 12/23/2015  . Sciatica of right side 10/06/2015  . History of migraine headaches 10/06/2015  . Frequent PVCs 12/28/2013   . Premature atrial contractions 12/28/2013  . PSVT (paroxysmal supraventricular tachycardia) ([place]) 12/28/2013  . Heart palpitations 07/13/2013  . Sleep apnea 04/11/2013  . Scoliosis 04/11/2013  . Atrial flutter with rapid ventricular response ([place]) 11/29/2012  . Chest pain, atypical 11/29/2012  . Fibromyalgia syndrome 11/29/2012  . Chronic steroid use 11/29/2012    Sigurd Sos, PT 01/09/20 11:45 AM  [facility] [address] [place], [place], [address] Phone: [phone]   Fax:  [phone]  Name: GAILENE YOUKHANA MRN: [id number] Date of Birth: 24-Aug-1932

## 2020-01-11 ENCOUNTER — Other Ambulatory Visit: Payer: Self-pay

## 2020-01-11 ENCOUNTER — Ambulatory Visit: Payer: Medicare Other

## 2020-01-11 DIAGNOSIS — M62830 Muscle spasm of back: Secondary | ICD-10-CM

## 2020-01-11 DIAGNOSIS — R293 Abnormal posture: Secondary | ICD-10-CM

## 2020-01-11 DIAGNOSIS — M4125 Other idiopathic scoliosis, thoracolumbar region: Secondary | ICD-10-CM | POA: Diagnosis not present

## 2020-01-11 DIAGNOSIS — G8929 Other chronic pain: Secondary | ICD-10-CM

## 2020-01-11 DIAGNOSIS — M6281 Muscle weakness (generalized): Secondary | ICD-10-CM | POA: Diagnosis not present

## 2020-01-11 DIAGNOSIS — M542 Cervicalgia: Secondary | ICD-10-CM

## 2020-01-11 DIAGNOSIS — M545 Low back pain: Secondary | ICD-10-CM | POA: Diagnosis not present

## 2020-01-11 NOTE — Therapy (Signed)
[facility] [address], [place], [place], [address] Phone: [phone]   Fax:  [phone]  Physical Therapy Treatment  Patient Details  Name: Janet Nguyen MRN: [id number] Date of Birth: 09-19-1932 Referring Provider (PT): Melinda Crutch, MD   Encounter Date: 01/11/2020  PT End of Session - 01/11/20 1140    Visit Number  9    Date for PT Re-Evaluation  01/23/20    Authorization Type  Medicare    PT Start Time  1103    PT Stop Time  1141    PT Time Calculation (min)  38 min    Activity Tolerance  Patient tolerated treatment well    Behavior During Therapy  [facility] for tasks assessed/performed       Past Medical History:  Diagnosis Date  . Arrhythmia    History of SVT with documented PVC'S and  PAC'S  12/08/12 Nuc stress test normal LV EF 74%  Event Monitor  12/01/12-01/03/13  . Atrial flutter ([place])   . Celiac disease    treated by Dr. [name]  . GERD (gastroesophageal reflux disease)   . Intervertebral disc stenosis of neural canal of cervical region   . Irregular heart beat 11/30/12   ECHO-EF 60-65%  . Osteoporosis   . PMR (polymyalgia rheumatica) (HCC)    Dr. Marijean Bravo; pt states she was diagnosed 10-15 years ago, not treated at this time or any issues that she is aware of.  . Scoliosis   . Scoliosis   . Sleep apnea 10/02/11 [facility]   Sleep study AHI -total sleep 10.3/hr  64.0/ hr during REM sleep.RDI 22.8/hr during total sleep 64.0/hr during REM sleep The lowest O2 sat during Non-REM and REM sleep was 86% and 88% respectively. 04/08/12 CPAP/BIPAP titration study [facility]    Past Surgical History:  Procedure Laterality Date  . APPENDECTOMY     ruptured at age 20 and had surgery  . CARDIAC CATHETERIZATION  01/27/06  . cataract surgery  2015   Dr. Herbert Deaner; March & April 2015    There were no vitals filed for this visit.  Subjective Assessment - 01/11/20 1108    Subjective  I am feeling good today.  I am doing my exercises.    Currently in Pain?  Yes    Pain Score  5     Pain Location  Back    Pain Orientation  Left    Pain Descriptors / Indicators  Aching;Sore    Pain Type  Chronic pain                       [facility] Adult PT Treatment/Exercise - 01/11/20 0001      Manual Therapy   Manual Therapy  Myofascial release;Soft tissue mobilization    Manual therapy comments  soft tissue elongation to bil upper traps, suboccipitals and bil cervical paraspinals and lumbar paraspinals (sidelying) with pt propped on wedge               PT Short Term Goals - 01/09/20 1106      PT SHORT TERM GOAL #1   Title  independent with postural correction exercises to elongate her trunk and separate rib cage from pelvis    Status  Achieved      PT SHORT TERM GOAL #2   Title  report a 20% reduction in LBP and neck pain with sitting        PT  Long Term Goals - 01/09/20 1106      PT LONG TERM GOAL #1   Title  independent with postural strengthening exercises and flexibility exercises to address scoliosis    Status  Achieved            Plan - 01/11/20 1108    Clinical Impression Statement  Pt is responding well with manual therapy in the clinic and elongation exercises at home to address scoliosis and muscle tension.  Pt has varied levels of pain that is based on activity and has had reduced pain overall this week.   Pt is modifying activity to reduce long periods of sitting at the computer. Pt reports 50% overall improvement in ability to stand straight, move with improved mobility. Pt has chronic condition and is managing symptoms with manual therapy and flexibility.    Rehab Potential  Good    PT Frequency  2x / week    PT Duration  8 weeks    PT Treatment/Interventions  ADLs/Self Care Home Management;Cryotherapy;Electrical Stimulation;Moist Heat;Neuromuscular re-education;Therapeutic exercise;Therapeutic activities;Functional  mobility training;Patient/family education;Manual techniques;Dry needling;Passive range of motion;Taping    PT Next Visit Plan  continue manaul therapy to elongate    PT Home Exercise Plan  Access Code: [email or web address]    Consulted and Agree with Plan of Care  Patient       Patient will benefit from skilled therapeutic intervention in order to improve the following deficits and impairments:  Decreased activity tolerance, Decreased mobility, Decreased strength, Postural dysfunction, Improper body mechanics, Impaired flexibility, Pain, Increased muscle spasms, Decreased endurance, Decreased range of motion  Visit Diagnosis: Other idiopathic scoliosis, thoracolumbar region  Abnormal posture  Muscle weakness (generalized)  Muscle spasm of back  Chronic bilateral low back pain without sciatica  Cervicalgia     Problem List Patient Active Problem List   Diagnosis Date Noted  . Pulmonary hypertension, unspecified ([place]) 04/14/2019  . Ischemic colitis ([place]) 04/14/2019  . Migraine with aura and without status migrainosus, not intractable 11/08/2018  . Degeneration of lumbar intervertebral disc 10/14/2018  . Hoarseness of voice 03/04/2018  . Abdominal pain 07/01/2017  . Diverticulitis, colon   . Metabolic acidosis, increased anion gap   . Constipation 04/14/2017  . Lumbar hernia 04/14/2017  . Presbycusis of both ears 01/10/2017  . Tinnitus aurium, bilateral 01/10/2017  . Gastroesophageal reflux disease 08/20/2016  . Hemoptysis 08/20/2016  . Obstructive sleep apnea of adult 08/20/2016  . Rhinitis, chronic 08/20/2016  . Throat pain in adult 08/20/2016  . Fatigue 12/23/2015  . Sciatica of right side 10/06/2015  . History of migraine headaches 10/06/2015  . Frequent PVCs 12/28/2013  . Premature atrial contractions 12/28/2013  . PSVT (paroxysmal supraventricular tachycardia) ([place]) 12/28/2013  . Heart palpitations 07/13/2013  . Sleep apnea 04/11/2013  . Scoliosis 04/11/2013  . Atrial  flutter with rapid ventricular response ([place]) 11/29/2012  . Chest pain, atypical 11/29/2012  . Fibromyalgia syndrome 11/29/2012  . Chronic steroid use 11/29/2012     Sigurd Sos, PT 01/11/20 11:42 AM  [facility] [address] [place], [place], [address] Phone: [phone]   Fax:  [phone]  Name: Janet Nguyen MRN: [id number] Date of Birth: Dec 07, 1931

## 2020-01-12 ENCOUNTER — Encounter: Payer: Self-pay | Admitting: Cardiovascular Disease

## 2020-01-12 ENCOUNTER — Ambulatory Visit (INDEPENDENT_AMBULATORY_CARE_PROVIDER_SITE_OTHER): Payer: Medicare Other | Admitting: Cardiovascular Disease

## 2020-01-12 ENCOUNTER — Other Ambulatory Visit: Payer: Self-pay

## 2020-01-12 VITALS — BP 125/65 | HR 69 | Temp 96.8°F | Ht 62.0 in | Wt 120.8 lb

## 2020-01-12 DIAGNOSIS — G4733 Obstructive sleep apnea (adult) (pediatric): Secondary | ICD-10-CM | POA: Diagnosis not present

## 2020-01-12 DIAGNOSIS — I272 Pulmonary hypertension, unspecified: Secondary | ICD-10-CM

## 2020-01-12 DIAGNOSIS — M4125 Other idiopathic scoliosis, thoracolumbar region: Secondary | ICD-10-CM | POA: Diagnosis not present

## 2020-01-12 DIAGNOSIS — I471 Supraventricular tachycardia, unspecified: Secondary | ICD-10-CM

## 2020-01-12 DIAGNOSIS — I340 Nonrheumatic mitral (valve) insufficiency: Secondary | ICD-10-CM | POA: Diagnosis not present

## 2020-01-12 DIAGNOSIS — I10 Essential (primary) hypertension: Secondary | ICD-10-CM | POA: Diagnosis not present

## 2020-01-12 NOTE — Progress Notes (Signed)
50,000 patient ID: Janet Nguyen, female   DOB: 1932-02-16, 86 y.o.   MRN: [id number]     Primary  M.D.: Dr. Mardene Sayer  HPI: Janet Nguyen is a 86 y.o. female who presents for a 3 month followup cardiology evaluation.   Janet Nguyen has a history of documented SVT and also has PACs and PVCs  treated with beta blocker therapy. In April 2014 I further titrated her beta blocker therapy after cardiac event monitor revealed several bursts of recurrent SVT up to 177 beats per minute in March.  In July after she had had 2 episodes of chest fluttering which each lasted over 30 minutes which he did take metoprolol tartrate with relief I recommended further titration of her Toprol to 75 mg in the morning and 50 mg at night and if necessary she could further titrate this to 75 twice a day.  She has a history of obstructive sleep apnea but despite multiple attempts at CPAP utilization she has not been able to tolerate this. She was referred to Dr. [name] and has a customized dental appliance with mandibular advancement with improvement in some of her symptomatology. Due concern that her teeth may be moving in more recently she has has not been using customized appliance daily but has been using her old non-customized mouthguard.  At times shen has awakened abruptly from a dream with her heart pounding and a sensation of hot flashes, gasping for breath.   She also states that her scoliosis is getting worse. She does have left-sided musculoskeletal type chest pain due to her spine angulation.  Beause of her significant scoliosis, she feels she must sleep on her back.   She has a history of GERD for which she has been taking Nexium.  She presents for evaluation.  I had scheduled her for an overnight oximetry to see if she is a candidate for supplemental oxygen at nighttime since she refused to use CPAP and only very rarely uses her customized mouthpiece.  She does wear a mouthguard to reduce  bruxism.  Oximetry study was performed overnight on February 23/24.  Her mean oxygen saturation was 93.12%.  She spent 12 minutes and 16 seconds with O2 sat duration below 88% with the lowest O2 sat duration of 80%. I tried to set her up for supplemental oxygen at bedtime.  However, she has been denied for this on multiple occasions by her insurance/Medicare.  She feels that she is sleeping better.  She can only sleep in her back due to scoliosis.  Her left side is concave; her right side is convex.  She has had issues with labile blood pressure. She has had issues with lower back discomfort and sciatica leading to emergency room evaluation in November 2016.  She saw Dr. [name] for neurosurgical evaluation. She has migraine headaches. She  Recently saw Dr. [name] for these migraine headaches.  She has been on Toprol-XL 75 mg twice a day for her palpitations and presently denies any awareness an extra heartbeats. She takes Xanax on an as-needed basis for anxiety.  She has been taking Nexium 20, no grams every other day for GERD. She states her scoliosis is getting worse.  Has been experiencing more fatigue.  She also notes some occasional left hand numbness.  In a mouth guard but no ureteral longer uses her mandibular advancement device and not tolerate CPAP therapy for her obstructive sleep apnea.    She had experienced left-sided chest discomfort which most likely  was related to her significant scoliosis the potential neuropathy causing intermittent left arm and hand numbness.  I slightly reduced her Toprol-XL which she had been taking 150 mg daily and a 75 twice a day regimen to 75 and milligrams in the morning and 50 mg with ultimate plan to decrease this to 50 twice a day.  She states when she reduce this, she began to notice more optical migraines and resume taking the higher dose Toprol at 75 twice a day with improvement.  She has seen Dr. [name] for her paresthesias.  She admits to significant anxiety.   She had requested zolpidem for sleep initiation and maintenance.  In the past, we had tried 6.25 slow-release version to help with sleep maintenance, but due to cost issues preferred the 5 mg sleep initiation dose.  She experiences optical migraine headaches.  She was being seen by neurologist, Dr. Melton Alar  She continues to have issues with her scoliosis causing discomfort in her neck and arm due to her distortion.  He tells me she underwent endoscopy and colonoscopy as well as banding of hemorrhoids.  She had a benign polyp removed.  She has issues with spastic colon.  Her sleep is better with zolpiden 5 mg.  She saw Kerin Ransom on 11/13/2016.  She had noticed that her hair was "thinning" and was concerned about this being the result of Toprol.  He suggested possibly decreasing Toprol but she preferred not until she had seen me.  Since that time, she continues to experience some optical migraines.  She has noticed some leg left neck discomfort.  She denies any patches of hair loss.  She denies differential arm weakness.  She continues to have difficulty with her scoliosis with hip pain and rib cage discomfort.   When I saw her in March 2018, there was a blood pressure differential of 118/78 in the left arm and 140/80 in the right arm.  She subsequently underwent upper extremity Doppler evaluation on 12/25/2016.  She was noted have mild heterogeneous plaque with stable 1-39% bilateral internal carotid stenoses.  She had normal subclavian arteries bilaterally.  There are patent vertebral arteries with antegrade flow.  She did not have any significant blood pressure differential and had triphasic waveforms in both her right and biphasic in her left brachial artery.  Left blood pressure was 8 mm higher than the right brachial pressure.  At times, she continues to experience some vague chest wall symptoms, which most likely related to her posture.  She denies significant shortness of breath.  She has to sleep on  her back because of her scoliosis.  Uses a chin strap to prevent oral breathing.  She is not on CPAP.   In June 2019 she had concerns about some of her medications possibly causing hair loss or memory loss.  She admits to having dry eyes.  She continues to have difficulty from her scolWsaw her in November 2019 at which time she,  felt well from a cardiac standphk and apparently had a new oral appliance made.  She used this initially but then started to notice some teeth discomfort.  Apparently this again was treated by Dr.e still admits to being tired.  She has issues with her abdomen being tight which she believes is related to her scoliosis.  Her GERD is controlled with pantoprazole.    She was seen by Dr. Mardene Sayer for her primary care.  She had developed a fungal infection of her toenail.  He had  given her to [place] fine 20 mg.  She has not started this and was hesitant to do this due to potential interaction with metoprolol.  She also was evaluated by Dr. Brett Fairy.  According to Janet Nguyen she did not evaluate her ophthalmic migraines.  However she was planning to do a possible home study to reassess her sleep apnea.  In the past she did not tolerate any CPAP therapy and although initially tolerated customized oral appliance this seemed to cause difficulty with movement of her teeth.  She has been using the CALM app on her smart phone to help with relaxation and improve sleep and is chronically dependent on low-dose zolpidem prescribed by Dr. Harrington Challenger.  She denies chest pain.  Her palpitations are well controlled with her current dose of metoprolol 75 mils twice a day and she can to be on low-dose amlodipine 2.5 mg.    Since I last saw her in June 2020, she underwent the home study by Dr. Theodoro Clock was done after several studies with no data.  According to Dr. Edwena Felty note, AHI was 26.5 which was moderate and during REM sleep AHI rose to 34.9.  Snoring was not recorded.  Apparently there was  discussion of possible Financial planner.  Janet Nguyen is not interested.  I last saw her in October 2020 at which time she had continued issues with migraines and some vertigo for which he takes the Epley maneuver.  She continued to have discomfort related to her scoliosis with muscle discomfort on her chest.  She believes she is waking up during dreaming and not sleeping well.  Her blood pressure has increased.  At her prior evaluation, I had instituted low-dose amlodipine at 2.5 mg.  With her blood pressure elevated during that evaluation I further titrated this to 5 mg I recommended she continue taking Toprol XL 75 mg twice a day.  She did not have any episodes of palpitations or recurrent SVT.  I last saw her in January 2021. She had seen  Dr. Jaynee Eagles of neurology.  She continued to experience some muscular neck aches which may be related to her scoliosis but do not sound ischemic in etiology.  She states she has some varicose veins in the right lower extremity and complains of trivial ankle swelling.  She denies palpitations.    Over the past several months, she has felt well from a cardiac standpoint. She continues to have issues from her scoliosis causing back issues as well as some discomfort to her left side of her rib cage which is she states bulges into her hip. She is sleeping adequately. Remotely she did not tolerate CPAP. She continues to be on amlodipine 5 mg daily, metoprolol succinate 75 mg twice a day both for blood pressure and her palpitations. GERD is fairly well controlled on pantoprazole. She continues to be on Librax which she states does offer improvement and she takes alprazolam on a as needed basis. She presents for reevaluation.  Past Medical History:  Diagnosis Date  . Arrhythmia    History of SVT with documented PVC'S and  PAC'S  12/08/12 Nuc stress test normal LV EF 74%  Event Monitor  12/01/12-01/03/13  . Atrial flutter ([place])   . Celiac disease    treated by Dr. [name]  .  GERD (gastroesophageal reflux disease)   . Intervertebral disc stenosis of neural canal of cervical region   . Irregular heart beat 11/30/12   ECHO-EF 60-65%  . Osteoporosis   . PMR (polymyalgia  rheumatica) ([place])    Dr. Marijean Bravo; pt states she was diagnosed 10-15 years ago, not treated at this time or any issues that she is aware of.  . Scoliosis   . Scoliosis   . Sleep apnea 10/02/11 [facility]   Sleep study AHI -total sleep 10.3/hr  64.0/ hr during REM sleep.RDI 22.8/hr during total sleep 64.0/hr during REM sleep The lowest O2 sat during Non-REM and REM sleep was 86% and 88% respectively. 04/08/12 CPAP/BIPAP titration study [facility]    Past Surgical History:  Procedure Laterality Date  . APPENDECTOMY     ruptured at age 43 and had surgery  . CARDIAC CATHETERIZATION  01/27/06  . cataract surgery  2015   Dr. Herbert Deaner; March & April 2015    Allergies  Allergen Reactions  . Augmentin [Amoxicillin-Pot Clavulanate]     Has never had, son allergic   . Gluten Meal     Unknown  . Naproxen     Stomach upset  . Codeine Nausea Only    Current Outpatient Medications  Medication Sig Dispense Refill  . ALPRAZolam (XANAX) 0.5 MG tablet Take 0.5 mg by mouth as needed.    Marland Kitchen amLODipine (NORVASC) 5 MG tablet Take 1 tablet (5 mg total) by mouth daily. 90 tablet 2  . clidinium-chlordiazePOXIDE (LIBRAX) 5-2.5 MG capsule Take 1 capsule by mouth as needed.    Noelle Penner ACETAMINOPHEN PO Take 500 mg by mouth as needed (pain).    Marland Kitchen FLUTICASONE PROPIONATE, NASAL, NA Place into the nose daily as needed (rhinitis).    . Lactobacillus (DIGESTIVE HEALTH PROBIOTIC PO) Take by mouth every other day.    . metoprolol succinate (TOPROL-XL) 50 MG 24 hr tablet TAKE 1 AND 1/2 TABLETS BY MOUTH TWO TIMES A DAY 270 tablet 0  . metoprolol tartrate (LOPRESSOR) 25 MG tablet Take 25 mg by mouth. Uses rarely as needed for atrial flutters    . Multiple Vitamins-Minerals (PRESERVISION AREDS 2  PO) Take 1 tablet by mouth every other day.    . pantoprazole (PROTONIX) 20 MG tablet Take 20 mg by mouth daily.     Marland Kitchen Phenylephrine-APAP-guaiFENesin (EQ SINUS CONGESTION & PAIN PO) Take by mouth as needed (for sinus pain). Contains acetaminophen 325 mg and Phenylephrine 5 mg    . Polyvinyl Alcohol-Povidone (REFRESH OP) Apply to eye at bedtime.     Marland Kitchen UNABLE TO FIND Take by mouth. Med Name: [place] WOMEN'S GUMMIES ONCE-TWICE DAILY    . UNABLE TO FIND Med Name: VITAFUSION CALCIUM PLUS D CHEWABLES, ONCE-TWICE DAILY    . UNABLE TO FIND Med Name: VITAFUSION OMEGA-3, ONCE-TWICE DAILY    . UNABLE TO FIND 1,000 mcg daily. Med Name: [place] BIOTIN    . UNABLE TO FIND 2 (two) times daily. Med Name: EQUATE FIBER POWDER/MIRALAX    . UNABLE TO FIND as needed (itching ears). Med Name: FLUOCINONIDE TOPICAL SOLUTION (DROPS)    . ZOLPIDEM TARTRATE ER PO Take 2.5 mg by mouth at bedtime.     No current facility-administered medications for this visit.    Socially she is divorced has 4 children 9 grandchildren. She does exercise. No tobacco use. She does occasional wine.  ROS General: Negative; No fevers, chills, or night sweats;  HEENT: Negative; No changes in vision or hearing, sinus congestion, difficulty swallowing Pulmonary: Negative; No cough, wheezing, shortness of breath, hemoptysis Cardiovascular: Positive for occasional chest wall pain and nocturnal palpitations GI: Negative; No nausea, vomiting, diarrhea, or abdominal pain  GU: Negative; No dysuria, hematuria, or difficulty voiding Musculoskeletal: Positive for significant scoliosis; fibromyalgia;  joint pain, or weakness Hematologic/Oncology: Negative; no easy bruising, bleeding Endocrine: Negative; no heat/cold intolerance; no diabetes Neuro: History of migraine headaches Skin: Positive for fungal infection of her toenail Psychiatric: Negative; No behavioral problems, depression Sleep: Positive for sleep apnea ; No snoring, daytime  sleepiness, hypersomnolence, bruxism, restless legs, hypnogognic hallucinations, no cataplexy Other comprehensive 14 point system review is negative.  PE BP 125/65   Pulse 69   Temp (!) 96.8 F (36 C)   Ht 5' 2"  (1.575 m)   Wt 120 lb 12.8 oz (54.8 kg)   SpO2 97%   BMI 22.09 kg/m    Repeat blood pressure 126/70  Wt Readings from Last 3 Encounters:  01/12/20 120 lb 12.8 oz (54.8 kg)  10/18/19 114 lb (51.7 kg)  10/17/19 115 lb (52.2 kg)   General: Alert, oriented, no distress.  Skin: normal turgor, no rashes, warm and dry HEENT: Normocephalic, atraumatic. Pupils equal round and reactive to light; sclera anicteric; extraocular muscles intact;  Nose without nasal septal hypertrophy Mouth/Parynx benign; Mallinpatti scale 3 Neck: No JVD, no carotid bruits; normal carotid upstroke Lungs: clear to ausculatation and percussion; no wheezing or rales Chest wall: Prominent scoliosis Heart: PMI not displaced, RRR, s1 s2 normal, 1/6 systolic murmur, no diastolic murmur, no rubs, gallops, thrills, or heaves Abdomen: soft, nontender; no hepatosplenomehaly, BS+; abdominal aorta nontender and not dilated by palpation. Back: Kyphoscoliosis Pulses 2+ Musculoskeletal: full range of motion, normal strength, no joint deformities Extremities: Prominent venous vessels without edema, no clubbing cyanosis, Homan's sign negative  Neurologic: grossly nonfocal; Cranial nerves grossly wnl Psychologic: Normal mood and affect   ECG (independently read by me): Normal sinus rhythm at 69 bpm. No ectopy. Normal intervals.  October 18, 2019 ECG (independently read by me): Sinus bradycardia 59 bpm.  QS complex V1 V2.  Normal intervals.  No ectopy  October 2020 ECG (independently read by me): Normal sinus rhythm at 63 bpm.  QS complex V1 V2.  No ectopy.  Normal intervals.  June 2020 ECG (independently read by me): NSR at 62; no ectopy; normal intervals   November 2019 ECG (independently read by me): Normal  sinus rhythm with PAC.  Ventricular rate 66.  QS V1 V2.  Mild T wave abnormality  June 2019 ECG (independently read by me): Normal sinus rhythm at 64 bpm.  Normal intervals.  No ectopy.  September 2018 ECG (independently read by me): Normal sinus rhythm at 64 bpm.  Isolated PAC.  QTc interval 414 ms.  QS V1 V2, unchanged.  May 2018 ECG (independently read by me): Normal sinus rhythm at 65 bpm.  QS V1, V2.  Normal intervals.  No ST segment changes.  March 2018 ECG (independently read by me): Normal sinus rhythm at 65 bpm.  No ectopy.  Normal intervals.  September 2017 ECG (independently read by me): Normal sinus rhythm at 62 bpm.  QS V1 and V2.  Normal intervals.  April 2017 ECG (independently read by me): Normal sinus rhythm at 70 bpm.  No ectopy.  PR interval 158 ms and QTc interval 414 ms.  March 2017 ECG (independently read by me): normal sinus rhythm at 61 bpm..  No ST segment changes.  Normal intervals.  QTc interval 396 ms.  August 2014 ECG (independently read by me): Normal sinus rhythm at 63 bpm.  QRS couplets V1 V2.  No cigarette ST-T changes.  May 2016 ECG (independently read by  me): Sinus bradycardia 59 bpm.  No ectopy.  February 2016 ECG (independently read by me): Sinus bradycardia 56 bpm.  Normal intervals.  Prior ECG (independently read by me): Normal sinus rhythm at 62 beats per minute.  QTc interval 411 milliseconds.  QRS complex V1, V2.  Normal intervals.  ECG (independently read by me): Normal sinus rhythm at 62 beats per minute. Normal intervals. QTc interval 399 ms.  Prior ECG of 07/13/2013: Sinus rhythm at 61 beats per minute. QTc interval 406 ms. PR interval normal at 168 ms.  LABS: BMP Latest Ref Rng & Units 12/05/2017 07/02/2017 07/01/2017  Glucose 65 - 99 mg/dL 129(H) 88 102(H)  BUN 6 - 20 mg/dL 13 9 13   Creatinine 0.44 - 1.00 mg/dL 0.87 0.95 0.82  Sodium 135 - 145 mmol/L 136 139 134(L)  Potassium 3.5 - 5.1 mmol/L 3.8 3.8 3.6  Chloride 101 - 111 mmol/L 100(L)  109 101  CO2 22 - 32 mmol/L 26 24 24   Calcium 8.9 - 10.3 mg/dL 9.6 8.7(L) 9.3   Hepatic Function Latest Ref Rng & Units 12/05/2017 07/01/2017 06/30/2017  Total Protein 6.5 - 8.1 g/dL 7.6 7.2 8.2(H)  Albumin 3.5 - 5.0 g/dL 4.4 4.4 5.0  AST 15 - 41 U/L 30 28 40  ALT 14 - 54 U/L 18 16 17   Alk Phosphatase 38 - 126 U/L 55 40 46  Total Bilirubin 0.3 - 1.2 mg/dL 0.6 1.3(H) 1.4(H)  Bilirubin, Direct 0.1 - 0.5 mg/dL - 0.1 -   CBC Latest Ref Rng & Units 12/05/2017 07/02/2017 07/01/2017  WBC 4.0 - 10.5 K/uL 9.2 10.1 8.3  Hemoglobin 12.0 - 15.0 g/dL 12.0 11.2(L) 14.1  Hematocrit 36.0 - 46.0 % 36.0 32.9(L) 40.8  Platelets 150 - 400 K/uL 288 224 272   Lab Results  Component Value Date   TSH 1.07 12/03/2016  Lipid Panel     Component Value Date/Time   CHOL 154 12/03/2016 0845   TRIG 81 12/03/2016 0845   HDL 67 12/03/2016 0845   CHOLHDL 2.3 12/03/2016 0845   VLDL 16 12/03/2016 0845   LDLCALC 71 12/03/2016 0845     RADIOLOGY: No results found.  IMPRESSION:  1. Essential hypertension   2. PSVT (paroxysmal supraventricular tachycardia) ([place])   3. Pulmonary hypertension, unspecified ([place])   4. Mild mitral regurgitation   5. OSA (obstructive sleep apnea)   6. Other idiopathic scoliosis, thoracolumbar region     ASSESSMENT AND PLAN: Janet Nguyen is an 86 year old female who has a history of documented SVT and PACs/PVCs which have been controlled with beta blocker therapy. On her echo in March 2014 she had normal systolic function with grade 1 diastolic dysfunction and had  significant left atrial dilatation and mild dilatation of the right ventricle with moderate dilatation of the right atrium. She had mild/moderate pulmonary hypertension with PA pressures of 42 mm. Her most recent echo in October 2018 showed an EF of 55 to 60% with grade 1 diastolic dysfunction, mitral annular calcification with mild MR, and mild pulmonary hypertension with PA pressure 34 mm. Her left atrium was mild to moderately  dilated. Her palpitations have resolved on Toprol-XL 75 mg twice a day.  She has obstructive sleep apnea but did not tolerate CPAP therapy.  She also had undergone oral appliance treatment by Dr. Oneal Grout.  She felt that this was shifting her teeth and stop using this.  A reevaluation with Dr. Brett Fairy confirmed moderate sleep apnea.  She has no interest in inspire  technology. Her blood pressure today is stable on her regimen consisting of amlodipine 5 mg in addition to her Toprol-XL 75 mg twice a day. She is not having any palpitations or SVT. She has history of migraine headaches and had been evaluated by Dr. Jaynee Eagles who she no longer sees. She has been undergoing outpatient rehab for her abnormal posture with physical therapy. She received her Covid vaccinations.   Troy Sine, MD, [facility]  01/14/2020 11:43 AM

## 2020-01-12 NOTE — Patient Instructions (Signed)

## 2020-01-14 ENCOUNTER — Encounter: Payer: Self-pay | Admitting: Cardiovascular Disease

## 2020-01-23 ENCOUNTER — Other Ambulatory Visit: Payer: Self-pay

## 2020-01-23 ENCOUNTER — Ambulatory Visit: Payer: Medicare Other

## 2020-01-23 DIAGNOSIS — M62830 Muscle spasm of back: Secondary | ICD-10-CM | POA: Diagnosis not present

## 2020-01-23 DIAGNOSIS — M6281 Muscle weakness (generalized): Secondary | ICD-10-CM

## 2020-01-23 DIAGNOSIS — G8929 Other chronic pain: Secondary | ICD-10-CM

## 2020-01-23 DIAGNOSIS — R293 Abnormal posture: Secondary | ICD-10-CM | POA: Diagnosis not present

## 2020-01-23 DIAGNOSIS — M545 Low back pain, unspecified: Secondary | ICD-10-CM

## 2020-01-23 DIAGNOSIS — M4125 Other idiopathic scoliosis, thoracolumbar region: Secondary | ICD-10-CM

## 2020-01-23 DIAGNOSIS — M542 Cervicalgia: Secondary | ICD-10-CM

## 2020-01-23 NOTE — Therapy (Signed)
[facility] [address] [place], [place], [address] Phone: [phone]   Fax:  [phone]  Physical Therapy Treatment  Patient Details  Name: Janet Nguyen MRN: [id number] Date of Birth: 1931/12/02 Referring Provider (PT): Melinda Crutch, MD   Encounter Date: 01/23/2020 Progress Note Reporting Period 01/23/20 to 03/05/20  See note below for Objective Data and Assessment of Progress/Goals.      PT End of Session - 01/23/20 1659    Visit Number  10    Date for PT Re-Evaluation  03/05/20    Authorization Type  Medicare    Progress Note Due on Visit  20    PT Start Time  1616    PT Stop Time  1658    PT Time Calculation (min)  42 min    Activity Tolerance  Patient tolerated treatment well    Behavior During Therapy  WFL for tasks assessed/performed       Past Medical History:  Diagnosis Date  . Arrhythmia    History of SVT with documented PVC'S and  PAC'S  12/08/12 Nuc stress test normal LV EF 74%  Event Monitor  12/01/12-01/03/13  . Atrial flutter ([place])   . Celiac disease    treated by Dr. [name]  . GERD (gastroesophageal reflux disease)   . Intervertebral disc stenosis of neural canal of cervical region   . Irregular heart beat 11/30/12   ECHO-EF 60-65%  . Osteoporosis   . PMR (polymyalgia rheumatica) (HCC)    Dr. Marijean Bravo; pt states she was diagnosed 10-15 years ago, not treated at this time or any issues that she is aware of.  . Scoliosis   . Scoliosis   . Sleep apnea 10/02/11 [facility]   Sleep study AHI -total sleep 10.3/hr  64.0/ hr during REM sleep.RDI 22.8/hr during total sleep 64.0/hr during REM sleep The lowest O2 sat during Non-REM and REM sleep was 86% and 88% respectively. 04/08/12 CPAP/BIPAP titration study [facility]    Past Surgical History:  Procedure Laterality Date  . APPENDECTOMY     ruptured at age 3 and had surgery  . CARDIAC CATHETERIZATION   01/27/06  . cataract surgery  2015   Dr. Herbert Deaner; March & April 2015    There were no vitals filed for this visit.  Subjective Assessment - 01/23/20 1617    Subjective  I couldn't get an appointment with you any sooner.  I am overall feeling better and it has been slow    Pertinent History  migraines, significant scoliosis, polymyalgia rheumatica, sleep apnea    Currently in Pain?  Yes    Pain Score  7    4/10-7/10   Pain Location  Back   cervical spine   Pain Orientation  Left    Pain Descriptors / Indicators  Aching;Sore    Pain Type  Chronic pain    Pain Onset  More than a month ago    Pain Frequency  Constant    Aggravating Factors   overdoing it, yardwork/gardening    Pain Relieving Factors  therapy, Tylenol, manual therapy         [facility] PT Assessment - 01/23/20 0001      Assessment   Medical Diagnosis  low back pain, neck pain, thoracic pain    Referring Provider (PT)  Melinda Crutch, MD    Next MD Visit  11/30/19    Prior Therapy  2 years ago  Prior Function   Level of Independence  Independent    Vocation  Retired      Associate Professor   Overall Cognitive Status  Within Functional Limits for tasks assessed      Posture/Postural Control   Posture/Postural Control  Postural limitations    Postural Limitations  Flexed trunk;Increased thoracic kyphosis;Forward head   50% more erect in standing   Posture Comments  significant scoliosis      ROM / Strength   AROM / PROM / Strength  AROM;Strength      AROM   Overall AROM   Deficits    AROM Assessment Site  Cervical    Cervical - Right Rotation  45    Cervical - Left Rotation  45      Palpation   Palpation comment  Pt with tension and trigger points over bil upper traps, suboccipitals, thoracic paraspinals and rhomboids and lumbar paraspinals-this is overall improved since the start of care.      Transfers   Transfers  Sit to Stand;Stand to Sit    Sit to Stand  With upper extremity assist                    [facility] Adult PT Treatment/Exercise - 01/23/20 0001      Manual Therapy   Manual Therapy  Myofascial release;Soft tissue mobilization    Manual therapy comments  soft tissue elongation to bil upper traps, suboccipitals and bil cervical paraspinals and lumbar paraspinals (sidelying) with pt propped on wedge.                 PT Short Term Goals - 01/09/20 1106      PT SHORT TERM GOAL #1   Title  independent with postural correction exercises to elongate her trunk and separate rib cage from pelvis    Status  Achieved      PT SHORT TERM GOAL #2   Title  report a 20% reduction in LBP and neck pain with sitting        PT Long Term Goals - 01/23/20 1626      PT LONG TERM GOAL #1   Title  independent with postural strengthening exercises and flexibility exercises to address scoliosis    Baseline  50% compliance    Time  6    Period  Weeks    Status  On-going    Target Date  03/05/20      PT LONG TERM GOAL #2   Title  report a 40% reduction in neck and low back pain with sitting    Baseline  25% improvement in frequency    Time  6    Period  Weeks    Status  On-going    Target Date  03/05/20      PT LONG TERM GOAL #3   Title  improve cervical A/ROM to 50 degrees bilaterally to improve ability to look to the side with driving    Baseline  45 degrees bilaterally    Time  6    Period  Weeks    Status  On-going    Target Date  03/05/20      PT LONG TERM GOAL #4   Title  report 60-70% increased ease in standing erect due to improved flexibility and alignment    Time  6    Period  Weeks    Status  On-going    Target Date  03/05/20  Plan - 01/23/20 1657    Clinical Impression Statement  Pt is responding well with manual therapy in the clinic and elongation exercises at home to address severe scoliosis and muscle tension.  Pt has varied levels of pain that is based on activity.  Pt is modifying activity to reduce long periods of  sitting at the computer. Pt reports 50% overall improvement in ability to stand straight, move with improved mobility. Pt with improved space in her abdomen and reports reduced discomfort as she is able to separate her pelvis from her ribs. Cervical A/ROM is improved. Pt has chronic condition including scoliosis and  polymyalgia rheumatica and is managing symptoms with manual therapy and flexibility.    PT Frequency  2x / week    PT Duration  6 weeks    PT Treatment/Interventions  ADLs/Self Care Home Management;Cryotherapy;Electrical Stimulation;Moist Heat;Neuromuscular re-education;Therapeutic exercise;Therapeutic activities;Functional mobility training;Patient/family education;Manual techniques;Dry needling;Passive range of motion;Taping    PT Next Visit Plan  continue manaul therapy to elongate, Dry needling to lt quadratus?, update HEP as needed    PT Home Exercise Plan  Access Code: [email or web address]    Consulted and Agree with Plan of Care  Patient       Patient will benefit from skilled therapeutic intervention in order to improve the following deficits and impairments:  Decreased activity tolerance, Decreased mobility, Decreased strength, Postural dysfunction, Improper body mechanics, Impaired flexibility, Pain, Increased muscle spasms, Decreased endurance, Decreased range of motion  Visit Diagnosis: Abnormal posture - Plan: PT plan of care cert/re-cert  Other idiopathic scoliosis, thoracolumbar region - Plan: PT plan of care cert/re-cert  Muscle weakness (generalized) - Plan: PT plan of care cert/re-cert  Muscle spasm of back - Plan: PT plan of care cert/re-cert  Chronic bilateral low back pain without sciatica - Plan: PT plan of care cert/re-cert  Cervicalgia - Plan: PT plan of care cert/re-cert     Problem List Patient Active Problem List   Diagnosis Date Noted  . Pulmonary hypertension, unspecified ([place]) 04/14/2019  . Ischemic colitis ([place]) 04/14/2019  . Migraine with aura and  without status migrainosus, not intractable 11/08/2018  . Degeneration of lumbar intervertebral disc 10/14/2018  . Hoarseness of voice 03/04/2018  . Abdominal pain 07/01/2017  . Diverticulitis, colon   . Metabolic acidosis, increased anion gap   . Constipation 04/14/2017  . Lumbar hernia 04/14/2017  . Presbycusis of both ears 01/10/2017  . Tinnitus aurium, bilateral 01/10/2017  . Gastroesophageal reflux disease 08/20/2016  . Hemoptysis 08/20/2016  . Obstructive sleep apnea of adult 08/20/2016  . Rhinitis, chronic 08/20/2016  . Throat pain in adult 08/20/2016  . Fatigue 12/23/2015  . Sciatica of right side 10/06/2015  . History of migraine headaches 10/06/2015  . Frequent PVCs 12/28/2013  . Premature atrial contractions 12/28/2013  . PSVT (paroxysmal supraventricular tachycardia) ([place]) 12/28/2013  . Heart palpitations 07/13/2013  . Sleep apnea 04/11/2013  . Scoliosis 04/11/2013  . Atrial flutter with rapid ventricular response ([place]) 11/29/2012  . Chest pain, atypical 11/29/2012  . Fibromyalgia syndrome 11/29/2012  . Chronic steroid use 11/29/2012     Sigurd Sos, PT 01/23/20 5:02 PM  [facility] [address], [place], [place], [address] Phone: [phone]   Fax:  [phone]  Name: Janet Nguyen MRN: [id number] Date of Birth: [birth year]

## 2020-01-30 DIAGNOSIS — N3945 Continuous leakage: Secondary | ICD-10-CM | POA: Diagnosis not present

## 2020-01-31 ENCOUNTER — Ambulatory Visit: Payer: Medicare Other | Attending: Family Medicine

## 2020-01-31 ENCOUNTER — Other Ambulatory Visit: Payer: Self-pay

## 2020-01-31 DIAGNOSIS — M545 Low back pain: Secondary | ICD-10-CM | POA: Insufficient documentation

## 2020-01-31 DIAGNOSIS — M542 Cervicalgia: Secondary | ICD-10-CM | POA: Insufficient documentation

## 2020-01-31 DIAGNOSIS — R293 Abnormal posture: Secondary | ICD-10-CM | POA: Insufficient documentation

## 2020-01-31 DIAGNOSIS — M4125 Other idiopathic scoliosis, thoracolumbar region: Secondary | ICD-10-CM

## 2020-01-31 DIAGNOSIS — G8929 Other chronic pain: Secondary | ICD-10-CM

## 2020-01-31 DIAGNOSIS — M6281 Muscle weakness (generalized): Secondary | ICD-10-CM | POA: Insufficient documentation

## 2020-01-31 DIAGNOSIS — M62830 Muscle spasm of back: Secondary | ICD-10-CM | POA: Diagnosis not present

## 2020-01-31 NOTE — Patient Instructions (Signed)
Access Code: [email or web address] URL: [email or web address] Date: 01/31/2020 Prepared by: [name]  Exercises Seated Cervical Flexion AROM - 3 x daily - 7 x weekly - 3 reps - 1 sets - 20 hold Seated Cervical Sidebending AROM - 3 x daily - 7 x weekly - 3 reps - 1 sets - 20 hold Seated Cervical Rotation AROM - 3 x daily - 7 x weekly - 3 reps - 1 sets - 20 hold Seated Correct Posture - 1 x daily - 7 x weekly - 10 reps - 3 sets Seated Transversus Abdominis Bracing with PLB - 5 x daily - 7 x weekly - 3 sets - 10 reps Standing Transverse Abdominis Contraction - 5 x daily - 7 x weekly - 3 sets - 10 reps Seated Diaphragmatic Breathing - 5 x daily - 7 x weekly - 1 sets - 10 reps

## 2020-01-31 NOTE — Therapy (Signed)
[facility] [address] [place], [place], [address] Phone: [phone]   Fax:  [phone]  Physical Therapy Treatment  Patient Details  Name: Janet Nguyen MRN: [id number] Date of Birth: 1932/08/28 Referring Provider (PT): Melinda Crutch, MD   Encounter Date: 01/31/2020  PT End of Session - 01/31/20 1054    Visit Number  11    Date for PT Re-Evaluation  03/05/20    Authorization Type  Medicare    PT Start Time  1006    PT Stop Time  1054    PT Time Calculation (min)  48 min    Activity Tolerance  Patient tolerated treatment well    Behavior During Therapy  [facility] for tasks assessed/performed       Past Medical History:  Diagnosis Date  . Arrhythmia    History of SVT with documented PVC'S and  PAC'S  12/08/12 Nuc stress test normal LV EF 74%  Event Monitor  12/01/12-01/03/13  . Atrial flutter ([place])   . Celiac disease    treated by Dr. [name]  . GERD (gastroesophageal reflux disease)   . Intervertebral disc stenosis of neural canal of cervical region   . Irregular heart beat 11/30/12   ECHO-EF 60-65%  . Osteoporosis   . PMR (polymyalgia rheumatica) (HCC)    Dr. Marijean Bravo; pt states she was diagnosed 10-15 years ago, not treated at this time or any issues that she is aware of.  . Scoliosis   . Scoliosis   . Sleep apnea 10/02/11 [facility]   Sleep study AHI -total sleep 10.3/hr  64.0/ hr during REM sleep.RDI 22.8/hr during total sleep 64.0/hr during REM sleep The lowest O2 sat during Non-REM and REM sleep was 86% and 88% respectively. 04/08/12 CPAP/BIPAP titration study [facility]    Past Surgical History:  Procedure Laterality Date  . APPENDECTOMY     ruptured at age 23 and had surgery  . CARDIAC CATHETERIZATION  01/27/06  . cataract surgery  2015   Dr. Herbert Deaner; March & April 2015    There were no vitals filed for this visit.  Subjective Assessment - 01/31/20 1009    Subjective  I am feeling better and straighter.    Patient Stated Goals  reduce pain and improve mobility to be able to do exercises.    Currently in Pain?  Yes    Pain Onset  More than a month ago    Pain Frequency  Constant    Aggravating Factors   morning hours    Pain Relieving Factors  therapy, tylenol, manual                       [facility] Adult PT Treatment/Exercise - 01/31/20 0001      Exercises   Exercises  Lumbar      Lumbar Exercises: Seated   Other Seated Lumbar Exercises  seated TA activation and diaphramatic breathing      Manual Therapy   Manual Therapy  Myofascial release;Soft tissue mobilization    Manual therapy comments  soft tissue elongation to bil upper traps, suboccipitals and bil cervical paraspinals and lumbar paraspinals (sidelying) with pt propped on wedge.               PT Education - 01/31/20 1027    Education Details  Access Code: [email or web address]    Person(s) Educated  Patient    Methods  Explanation;Demonstration;Handout    Comprehension  Verbalized understanding;Returned demonstration       PT Short Term Goals - 01/09/20 1106      PT SHORT TERM GOAL #1   Title  independent with postural correction exercises to elongate her trunk and separate rib cage from pelvis    Status  Achieved      PT SHORT TERM GOAL #2   Title  report a 20% reduction in LBP and neck pain with sitting        PT Long Term Goals - 01/23/20 1626      PT LONG TERM GOAL #1   Title  independent with postural strengthening exercises and flexibility exercises to address scoliosis    Baseline  50% compliance    Time  6    Period  Weeks    Status  On-going    Target Date  03/05/20      PT LONG TERM GOAL #2   Title  report a 40% reduction in neck and low back pain with sitting    Baseline  25% improvement in frequency    Time  6    Period  Weeks    Status  On-going    Target Date  03/05/20      PT LONG TERM GOAL #3   Title  improve cervical A/ROM to  50 degrees bilaterally to improve ability to look to the side with driving    Baseline  45 degrees bilaterally    Time  6    Period  Weeks    Status  On-going    Target Date  03/05/20      PT LONG TERM GOAL #4   Title  report 60-70% increased ease in standing erect due to improved flexibility and alignment    Time  6    Period  Weeks    Status  On-going    Target Date  03/05/20            Plan - 01/31/20 1104    Clinical Impression Statement  Pt reports improvement in her ability to stand up straight at home and with activity.  PT added lower abdominal activation and diaphragmatic breathing to HEP to address abdominal bulge and discomfort.  Pt demonstrated reduced rib mobility with inhalation.  Pt with tenson in bil neck, lumbar and quadratus and responded well to manual therapy.  Pt will continue to benefit from skilled PT to address core strength, diaphragm and rib mobility and tissue mobility.    PT Frequency  2x / week    PT Duration  6 weeks    PT Next Visit Plan  continue manaul therapy to elongate, Dry needling to lt quadratus?, update HEP as needed    PT Home Exercise Plan  Access Code: [email or web address]    Consulted and Agree with Plan of Care  Patient       Patient will benefit from skilled therapeutic intervention in order to improve the following deficits and impairments:  Decreased activity tolerance, Decreased mobility, Decreased strength, Postural dysfunction, Improper body mechanics, Impaired flexibility, Pain, Increased muscle spasms, Decreased endurance, Decreased range of motion  Visit Diagnosis: Other idiopathic scoliosis, thoracolumbar region  Abnormal posture  Muscle weakness (generalized)  Muscle spasm of back  Chronic bilateral low back pain without sciatica  Cervicalgia     Problem List Patient Active Problem List   Diagnosis Date Noted  . Pulmonary hypertension, unspecified ([place]) 04/14/2019  . Ischemic colitis ([place]) 04/14/2019  . Migraine with  aura and without status migrainosus, not  intractable 11/08/2018  . Degeneration of lumbar intervertebral disc 10/14/2018  . Hoarseness of voice 03/04/2018  . Abdominal pain 07/01/2017  . Diverticulitis, colon   . Metabolic acidosis, increased anion gap   . Constipation 04/14/2017  . Lumbar hernia 04/14/2017  . Presbycusis of both ears 01/10/2017  . Tinnitus aurium, bilateral 01/10/2017  . Gastroesophageal reflux disease 08/20/2016  . Hemoptysis 08/20/2016  . Obstructive sleep apnea of adult 08/20/2016  . Rhinitis, chronic 08/20/2016  . Throat pain in adult 08/20/2016  . Fatigue 12/23/2015  . Sciatica of right side 10/06/2015  . History of migraine headaches 10/06/2015  . Frequent PVCs 12/28/2013  . Premature atrial contractions 12/28/2013  . PSVT (paroxysmal supraventricular tachycardia) ([place]) 12/28/2013  . Heart palpitations 07/13/2013  . Sleep apnea 04/11/2013  . Scoliosis 04/11/2013  . Atrial flutter with rapid ventricular response ([place]) 11/29/2012  . Chest pain, atypical 11/29/2012  . Fibromyalgia syndrome 11/29/2012  . Chronic steroid use 11/29/2012     Sigurd Sos, PT 01/31/20 11:05 AM  [facility] [address], [place], [place], [address] Phone: [phone]   Fax:  [phone]  Name: Janet Nguyen MRN: [id number] Date of Birth: 02/18/1932

## 2020-02-02 ENCOUNTER — Ambulatory Visit: Payer: Medicare Other

## 2020-02-06 ENCOUNTER — Other Ambulatory Visit: Payer: Self-pay

## 2020-02-06 ENCOUNTER — Ambulatory Visit: Payer: Medicare Other

## 2020-02-06 DIAGNOSIS — R293 Abnormal posture: Secondary | ICD-10-CM

## 2020-02-06 DIAGNOSIS — M545 Low back pain, unspecified: Secondary | ICD-10-CM

## 2020-02-06 DIAGNOSIS — M542 Cervicalgia: Secondary | ICD-10-CM

## 2020-02-06 DIAGNOSIS — M62830 Muscle spasm of back: Secondary | ICD-10-CM | POA: Diagnosis not present

## 2020-02-06 DIAGNOSIS — G8929 Other chronic pain: Secondary | ICD-10-CM | POA: Diagnosis not present

## 2020-02-06 DIAGNOSIS — M4125 Other idiopathic scoliosis, thoracolumbar region: Secondary | ICD-10-CM

## 2020-02-06 DIAGNOSIS — M6281 Muscle weakness (generalized): Secondary | ICD-10-CM

## 2020-02-06 NOTE — Therapy (Signed)
[facility] [address] [place], [place], [address] Phone: [phone]   Fax:  [phone]  Physical Therapy Treatment  Patient Details  Name: Janet Nguyen MRN: [id number] Date of Birth: 1932-02-03 Referring Provider (PT): Melinda Crutch, MD   Encounter Date: 02/06/2020  PT End of Session - 02/06/20 1058    Visit Number  12    Date for PT Re-Evaluation  03/05/20    Authorization Type  Medicare    Progress Note Due on Visit  20    PT Start Time  1017    PT Stop Time  1058    PT Time Calculation (min)  41 min    Activity Tolerance  Patient tolerated treatment well    Behavior During Therapy  [facility] for tasks assessed/performed       Past Medical History:  Diagnosis Date  . Arrhythmia    History of SVT with documented PVC'S and  PAC'S  12/08/12 Nuc stress test normal LV EF 74%  Event Monitor  12/01/12-01/03/13  . Atrial flutter ([place])   . Celiac disease    treated by Dr. [name]  . GERD (gastroesophageal reflux disease)   . Intervertebral disc stenosis of neural canal of cervical region   . Irregular heart beat 11/30/12   ECHO-EF 60-65%  . Osteoporosis   . PMR (polymyalgia rheumatica) (HCC)    Dr. Marijean Bravo; pt states she was diagnosed 10-15 years ago, not treated at this time or any issues that she is aware of.  . Scoliosis   . Scoliosis   . Sleep apnea 10/02/11 [facility]   Sleep study AHI -total sleep 10.3/hr  64.0/ hr during REM sleep.RDI 22.8/hr during total sleep 64.0/hr during REM sleep The lowest O2 sat during Non-REM and REM sleep was 86% and 88% respectively. 04/08/12 CPAP/BIPAP titration study [facility]    Past Surgical History:  Procedure Laterality Date  . APPENDECTOMY     ruptured at age 72 and had surgery  . CARDIAC CATHETERIZATION  01/27/06  . cataract surgery  2015   Dr. Herbert Deaner; March & April 2015    There were no vitals filed for this visit.  Subjective  Assessment - 02/06/20 1017    Subjective  I am doing well with activating my lower abdominals and    Currently in Pain?  Yes    Pain Score  6     Pain Location  Back                       [facility] Adult PT Treatment/Exercise - 02/06/20 0001      Lumbar Exercises: Seated   Other Seated Lumbar Exercises  seated TA activation and diaphramatic breathing   verbal cues for technique      Manual Therapy   Manual Therapy  Myofascial release;Soft tissue mobilization    Manual therapy comments  soft tissue elongation to bil upper traps, suboccipitals and bil cervical paraspinals and lumbar paraspinals (sidelying) with pt propped on wedge.                 PT Short Term Goals - 01/09/20 1106      PT SHORT TERM GOAL #1   Title  independent with postural correction exercises to elongate her trunk and separate rib cage from pelvis    Status  Achieved      PT SHORT TERM GOAL #2   Title  report a 20% reduction  in LBP and neck pain with sitting        PT Long Term Goals - 01/23/20 1626      PT LONG TERM GOAL #1   Title  independent with postural strengthening exercises and flexibility exercises to address scoliosis    Baseline  50% compliance    Time  6    Period  Weeks    Status  On-going    Target Date  03/05/20      PT LONG TERM GOAL #2   Title  report a 40% reduction in neck and low back pain with sitting    Baseline  25% improvement in frequency    Time  6    Period  Weeks    Status  On-going    Target Date  03/05/20      PT LONG TERM GOAL #3   Title  improve cervical A/ROM to 50 degrees bilaterally to improve ability to look to the side with driving    Baseline  45 degrees bilaterally    Time  6    Period  Weeks    Status  On-going    Target Date  03/05/20      PT LONG TERM GOAL #4   Title  report 60-70% increased ease in standing erect due to improved flexibility and alignment    Time  6    Period  Weeks    Status  On-going    Target Date   03/05/20            Plan - 02/06/20 1057    Clinical Impression Statement  Pt reports improvement in her ability to stand up straight at home and with activity.  PT added lower abdominal activation and diaphragmatic breathing to HEP last week to address abdominal bulge and discomfort.  Pt demonstrated reduced rib mobility with inhalation and required max verbal and tactile cues for technique today.  Pt with tenson in bil neck, lumbar and quadratus and responded well to manual therapy.  Pt will continue to benefit from skilled PT to address core strength, diaphragm and rib mobility and tissue mobility.    PT Frequency  2x / week    PT Duration  6 weeks    PT Treatment/Interventions  ADLs/Self Care Home Management;Cryotherapy;Electrical Stimulation;Moist Heat;Neuromuscular re-education;Therapeutic exercise;Therapeutic activities;Functional mobility training;Patient/family education;Manual techniques;Dry needling;Passive range of motion;Taping    PT Next Visit Plan  continue manaul therapy to elongate, Dry needling to lt quadratus?    PT Home Exercise Plan  Access Code: [email or web address]    Consulted and Agree with Plan of Care  Patient       Patient will benefit from skilled therapeutic intervention in order to improve the following deficits and impairments:  Decreased activity tolerance, Decreased mobility, Decreased strength, Postural dysfunction, Improper body mechanics, Impaired flexibility, Pain, Increased muscle spasms, Decreased endurance, Decreased range of motion  Visit Diagnosis: Other idiopathic scoliosis, thoracolumbar region  Abnormal posture  Muscle weakness (generalized)  Muscle spasm of back  Chronic bilateral low back pain without sciatica  Cervicalgia     Problem List Patient Active Problem List   Diagnosis Date Noted  . Pulmonary hypertension, unspecified ([place]) 04/14/2019  . Ischemic colitis ([place]) 04/14/2019  . Migraine with aura and without status migrainosus, not  intractable 11/08/2018  . Degeneration of lumbar intervertebral disc 10/14/2018  . Hoarseness of voice 03/04/2018  . Abdominal pain 07/01/2017  . Diverticulitis, colon   . Metabolic acidosis, increased anion gap   . Constipation 04/14/2017  .  Lumbar hernia 04/14/2017  . Presbycusis of both ears 01/10/2017  . Tinnitus aurium, bilateral 01/10/2017  . Gastroesophageal reflux disease 08/20/2016  . Hemoptysis 08/20/2016  . Obstructive sleep apnea of adult 08/20/2016  . Rhinitis, chronic 08/20/2016  . Throat pain in adult 08/20/2016  . Fatigue 12/23/2015  . Sciatica of right side 10/06/2015  . History of migraine headaches 10/06/2015  . Frequent PVCs 12/28/2013  . Premature atrial contractions 12/28/2013  . PSVT (paroxysmal supraventricular tachycardia) ([place]) 12/28/2013  . Heart palpitations 07/13/2013  . Sleep apnea 04/11/2013  . Scoliosis 04/11/2013  . Atrial flutter with rapid ventricular response ([place]) 11/29/2012  . Chest pain, atypical 11/29/2012  . Fibromyalgia syndrome 11/29/2012  . Chronic steroid use 11/29/2012    Sigurd Sos, PT 02/06/20 11:01 AM  [facility] [address], [place], [place], [address] Phone: [phone]   Fax:  [phone]  Name: HAYLEY HORN MRN: [id number] Date of Birth: May 04, 1932

## 2020-02-09 ENCOUNTER — Other Ambulatory Visit: Payer: Self-pay

## 2020-02-09 ENCOUNTER — Ambulatory Visit: Payer: Medicare Other

## 2020-02-09 DIAGNOSIS — G8929 Other chronic pain: Secondary | ICD-10-CM | POA: Diagnosis not present

## 2020-02-09 DIAGNOSIS — M62830 Muscle spasm of back: Secondary | ICD-10-CM | POA: Diagnosis not present

## 2020-02-09 DIAGNOSIS — M542 Cervicalgia: Secondary | ICD-10-CM

## 2020-02-09 DIAGNOSIS — M4125 Other idiopathic scoliosis, thoracolumbar region: Secondary | ICD-10-CM

## 2020-02-09 DIAGNOSIS — M6281 Muscle weakness (generalized): Secondary | ICD-10-CM

## 2020-02-09 DIAGNOSIS — M545 Low back pain: Secondary | ICD-10-CM | POA: Diagnosis not present

## 2020-02-09 DIAGNOSIS — R293 Abnormal posture: Secondary | ICD-10-CM

## 2020-02-09 NOTE — Therapy (Signed)
[facility] [address] [place], [place], [address] Phone: [phone]   Fax:  [phone]  Physical Therapy Treatment  Patient Details  Name: Janet Nguyen MRN: [id number] Date of Birth: September 19, 1932 Referring Provider (PT): Melinda Crutch, MD   Encounter Date: 02/09/2020  PT End of Session - 02/09/20 1053    Visit Number  13    Date for PT Re-Evaluation  03/05/20    Authorization Type  Medicare    Progress Note Due on Visit  20    PT Start Time  1016    PT Stop Time  1047    PT Time Calculation (min)  31 min    Activity Tolerance  Patient tolerated treatment well    Behavior During Therapy  [facility] for tasks assessed/performed       Past Medical History:  Diagnosis Date  . Arrhythmia    History of SVT with documented PVC'S and  PAC'S  12/08/12 Nuc stress test normal LV EF 74%  Event Monitor  12/01/12-01/03/13  . Atrial flutter ([place])   . Celiac disease    treated by Dr. [name]  . GERD (gastroesophageal reflux disease)   . Intervertebral disc stenosis of neural canal of cervical region   . Irregular heart beat 11/30/12   ECHO-EF 60-65%  . Osteoporosis   . PMR (polymyalgia rheumatica) (HCC)    Dr. Marijean Bravo; pt states she was diagnosed 10-15 years ago, not treated at this time or any issues that she is aware of.  . Scoliosis   . Scoliosis   . Sleep apnea 10/02/11 [facility]   Sleep study AHI -total sleep 10.3/hr  64.0/ hr during REM sleep.RDI 22.8/hr during total sleep 64.0/hr during REM sleep The lowest O2 sat during Non-REM and REM sleep was 86% and 88% respectively. 04/08/12 CPAP/BIPAP titration study [facility]    Past Surgical History:  Procedure Laterality Date  . APPENDECTOMY     ruptured at age 62 and had surgery  . CARDIAC CATHETERIZATION  01/27/06  . cataract surgery  2015   Dr. Herbert Deaner; March & April 2015    There were no vitals filed for this visit.  Subjective  Assessment - 02/09/20 1017    Subjective  I am good today.    Currently in Pain?  Yes    Pain Score  5     Pain Location  Back    Pain Orientation  Left;Right                        [facility] Adult PT Treatment/Exercise - 02/09/20 0001      Lumbar Exercises: Seated   Other Seated Lumbar Exercises  seated TA activation and diaphramatic breathing   verbal cues for technique      Manual Therapy   Manual Therapy  Myofascial release;Soft tissue mobilization    Manual therapy comments  soft tissue elongation to bil upper traps, suboccipitals and bil cervical paraspinals and lumbar paraspinals (sidelying) with pt propped on wedge.                 PT Short Term Goals - 01/09/20 1106      PT SHORT TERM GOAL #1   Title  independent with postural correction exercises to elongate her trunk and separate rib cage from pelvis    Status  Achieved      PT SHORT TERM GOAL #2   Title  report a  20% reduction in LBP and neck pain with sitting        PT Long Term Goals - 01/23/20 1626      PT LONG TERM GOAL #1   Title  independent with postural strengthening exercises and flexibility exercises to address scoliosis    Baseline  50% compliance    Time  6    Period  Weeks    Status  On-going    Target Date  03/05/20      PT LONG TERM GOAL #2   Title  report a 40% reduction in neck and low back pain with sitting    Baseline  25% improvement in frequency    Time  6    Period  Weeks    Status  On-going    Target Date  03/05/20      PT LONG TERM GOAL #3   Title  improve cervical A/ROM to 50 degrees bilaterally to improve ability to look to the side with driving    Baseline  45 degrees bilaterally    Time  6    Period  Weeks    Status  On-going    Target Date  03/05/20      PT LONG TERM GOAL #4   Title  report 60-70% increased ease in standing erect due to improved flexibility and alignment    Time  6    Period  Weeks    Status  On-going    Target Date  03/05/20             Plan - 02/09/20 1048    Clinical Impression Statement  Pt reports improvement in her ability to stand up straight at home and with activity.  Pt demonstrated improved abdominal activation with exercises in standing.    Pt with tenson in bil neck, lumbar and quadratus and responded well to manual therapy.   Pt will continue to benefit from skilled PT to address core strength, diaphragm and rib mobility and tissue mobility.    Rehab Potential  Good    PT Frequency  2x / week    PT Duration  6 weeks    PT Treatment/Interventions  ADLs/Self Care Home Management;Cryotherapy;Electrical Stimulation;Moist Heat;Neuromuscular re-education;Therapeutic exercise;Therapeutic activities;Functional mobility training;Patient/family education;Manual techniques;Dry needling;Passive range of motion;Taping    PT Next Visit Plan  continue manaul therapy to elongate, HEP to activate lower abdominals    PT Home Exercise Plan  Access Code: [email or web address]    Consulted and Agree with Plan of Care  Patient       Patient will benefit from skilled therapeutic intervention in order to improve the following deficits and impairments:  Decreased activity tolerance, Decreased mobility, Decreased strength, Postural dysfunction, Improper body mechanics, Impaired flexibility, Pain, Increased muscle spasms, Decreased endurance, Decreased range of motion  Visit Diagnosis: Abnormal posture  Other idiopathic scoliosis, thoracolumbar region  Muscle weakness (generalized)  Muscle spasm of back  Chronic bilateral low back pain without sciatica  Cervicalgia     Problem List Patient Active Problem List   Diagnosis Date Noted  . Pulmonary hypertension, unspecified ([place]) 04/14/2019  . Ischemic colitis ([place]) 04/14/2019  . Migraine with aura and without status migrainosus, not intractable 11/08/2018  . Degeneration of lumbar intervertebral disc 10/14/2018  . Hoarseness of voice 03/04/2018  . Abdominal pain  07/01/2017  . Diverticulitis, colon   . Metabolic acidosis, increased anion gap   . Constipation 04/14/2017  . Lumbar hernia 04/14/2017  . Presbycusis of both ears 01/10/2017  . Tinnitus  aurium, bilateral 01/10/2017  . Gastroesophageal reflux disease 08/20/2016  . Hemoptysis 08/20/2016  . Obstructive sleep apnea of adult 08/20/2016  . Rhinitis, chronic 08/20/2016  . Throat pain in adult 08/20/2016  . Fatigue 12/23/2015  . Sciatica of right side 10/06/2015  . History of migraine headaches 10/06/2015  . Frequent PVCs 12/28/2013  . Premature atrial contractions 12/28/2013  . PSVT (paroxysmal supraventricular tachycardia) ([place]) 12/28/2013  . Heart palpitations 07/13/2013  . Sleep apnea 04/11/2013  . Scoliosis 04/11/2013  . Atrial flutter with rapid ventricular response ([place]) 11/29/2012  . Chest pain, atypical 11/29/2012  . Fibromyalgia syndrome 11/29/2012  . Chronic steroid use 11/29/2012    Sigurd Sos, PT 02/09/20 10:55 AM  [facility] [address] [place], [place], [address] Phone: [phone]   Fax:  [phone]  Name: Janet Nguyen MRN: [id number] Date of Birth: [birth year]

## 2020-02-13 ENCOUNTER — Ambulatory Visit: Payer: Medicare Other

## 2020-02-13 ENCOUNTER — Other Ambulatory Visit: Payer: Self-pay

## 2020-02-13 DIAGNOSIS — M545 Low back pain: Secondary | ICD-10-CM | POA: Diagnosis not present

## 2020-02-13 DIAGNOSIS — M62830 Muscle spasm of back: Secondary | ICD-10-CM | POA: Diagnosis not present

## 2020-02-13 DIAGNOSIS — R293 Abnormal posture: Secondary | ICD-10-CM

## 2020-02-13 DIAGNOSIS — G8929 Other chronic pain: Secondary | ICD-10-CM | POA: Diagnosis not present

## 2020-02-13 DIAGNOSIS — M542 Cervicalgia: Secondary | ICD-10-CM

## 2020-02-13 DIAGNOSIS — M4125 Other idiopathic scoliosis, thoracolumbar region: Secondary | ICD-10-CM | POA: Diagnosis not present

## 2020-02-13 DIAGNOSIS — M6281 Muscle weakness (generalized): Secondary | ICD-10-CM

## 2020-02-13 NOTE — Therapy (Signed)
[facility] [address], [place], [place], [address] Phone: [phone]   Fax:  [phone]  Physical Therapy Treatment  Patient Details  Name: Janet Nguyen MRN: [id number] Date of Birth: [birth year] Referring Provider (PT): Melinda Crutch, MD   Encounter Date: 02/13/2020  PT End of Session - 02/13/20 1056    Visit Number  14    Date for PT Re-Evaluation  03/05/20    Authorization Type  Medicare    Progress Note Due on Visit  20    PT Start Time  1017    PT Stop Time  1049    PT Time Calculation (min)  32 min    Activity Tolerance  Patient tolerated treatment well    Behavior During Therapy  [facility] for tasks assessed/performed       Past Medical History:  Diagnosis Date  . Arrhythmia    History of SVT with documented PVC'S and  PAC'S  12/08/12 Nuc stress test normal LV EF 74%  Event Monitor  12/01/12-01/03/13  . Atrial flutter ([place])   . Celiac disease    treated by Dr. [name]  . GERD (gastroesophageal reflux disease)   . Intervertebral disc stenosis of neural canal of cervical region   . Irregular heart beat 11/30/12   ECHO-EF 60-65%  . Osteoporosis   . PMR (polymyalgia rheumatica) (HCC)    Dr. Marijean Bravo; pt states she was diagnosed 10-15 years ago, not treated at this time or any issues that she is aware of.  . Scoliosis   . Scoliosis   . Sleep apnea 10/02/11 [facility]   Sleep study AHI -total sleep 10.3/hr  64.0/ hr during REM sleep.RDI 22.8/hr during total sleep 64.0/hr during REM sleep The lowest O2 sat during Non-REM and REM sleep was 86% and 88% respectively. 04/08/12 CPAP/BIPAP titration study [facility]    Past Surgical History:  Procedure Laterality Date  . APPENDECTOMY     ruptured at age 36 and had surgery  . CARDIAC CATHETERIZATION  01/27/06  . cataract surgery  2015   Dr. Herbert Deaner; March & April 2015    There were no vitals filed for this visit.  Subjective  Assessment - 02/13/20 1020    Subjective  I am hurting all over today. I'm working on activating my core at home.    Patient Stated Goals  reduce pain and improve mobility to be able to do exercises.    Currently in Pain?  Yes    Pain Score  5     Pain Location  Back                        [facility] Adult PT Treatment/Exercise - 02/13/20 0001      Lumbar Exercises: Standing   Other Standing Lumbar Exercises  core activation and rib mobility      Manual Therapy   Manual Therapy  Myofascial release;Soft tissue mobilization    Manual therapy comments  soft tissue elongation to bil upper traps, suboccipitals and bil cervical paraspinals and lumbar paraspinals (sidelying) with pt propped on wedge.                 PT Short Term Goals - 01/09/20 1106      PT SHORT TERM GOAL #1   Title  independent with postural correction exercises to elongate her trunk and separate rib cage from pelvis    Status  Achieved  PT SHORT TERM GOAL #2   Title  report a 20% reduction in LBP and neck pain with sitting        PT Long Term Goals - 01/23/20 1626      PT LONG TERM GOAL #1   Title  independent with postural strengthening exercises and flexibility exercises to address scoliosis    Baseline  50% compliance    Time  6    Period  Weeks    Status  On-going    Target Date  03/05/20      PT LONG TERM GOAL #2   Title  report a 40% reduction in neck and low back pain with sitting    Baseline  25% improvement in frequency    Time  6    Period  Weeks    Status  On-going    Target Date  03/05/20      PT LONG TERM GOAL #3   Title  improve cervical A/ROM to 50 degrees bilaterally to improve ability to look to the side with driving    Baseline  45 degrees bilaterally    Time  6    Period  Weeks    Status  On-going    Target Date  03/05/20      PT LONG TERM GOAL #4   Title  report 60-70% increased ease in standing erect due to improved flexibility and alignment    Time   6    Period  Weeks    Status  On-going    Target Date  03/05/20            Plan - 02/13/20 1052    Clinical Impression Statement  Pt reports improvement in her ability to stand up straight at home and with activity.    Pt demonstrates improved abdominal activation with exercises in standing.  Pt also showed pt how to press down on a table top in sitting to activate lower abdominals.   Pt with tenson in bil neck, lumbar and quadratus and responded well to manual therapy.   Pt will continue to benefit from skilled PT to address core strength, diaphragm and rib mobility and tissue mobility.    PT Frequency  2x / week    PT Duration  6 weeks    PT Treatment/Interventions  ADLs/Self Care Home Management;Cryotherapy;Electrical Stimulation;Moist Heat;Neuromuscular re-education;Therapeutic exercise;Therapeutic activities;Functional mobility training;Patient/family education;Manual techniques;Dry needling;Passive range of motion;Taping    PT Next Visit Plan  continue manaul therapy to elongate, HEP to activate lower abdominals    PT Home Exercise Plan  Access Code: [email or web address]    Consulted and Agree with Plan of Care  Patient       Patient will benefit from skilled therapeutic intervention in order to improve the following deficits and impairments:  Decreased activity tolerance, Decreased mobility, Decreased strength, Postural dysfunction, Improper body mechanics, Impaired flexibility, Pain, Increased muscle spasms, Decreased endurance, Decreased range of motion  Visit Diagnosis: Abnormal posture  Other idiopathic scoliosis, thoracolumbar region  Muscle weakness (generalized)  Muscle spasm of back  Chronic bilateral low back pain without sciatica  Cervicalgia     Problem List Patient Active Problem List   Diagnosis Date Noted  . Pulmonary hypertension, unspecified ([place]) 04/14/2019  . Ischemic colitis ([place]) 04/14/2019  . Migraine with aura and without status migrainosus, not  intractable 11/08/2018  . Degeneration of lumbar intervertebral disc 10/14/2018  . Hoarseness of voice 03/04/2018  . Abdominal pain 07/01/2017  . Diverticulitis, colon   . Metabolic  acidosis, increased anion gap   . Constipation 04/14/2017  . Lumbar hernia 04/14/2017  . Presbycusis of both ears 01/10/2017  . Tinnitus aurium, bilateral 01/10/2017  . Gastroesophageal reflux disease 08/20/2016  . Hemoptysis 08/20/2016  . Obstructive sleep apnea of adult 08/20/2016  . Rhinitis, chronic 08/20/2016  . Throat pain in adult 08/20/2016  . Fatigue 12/23/2015  . Sciatica of right side 10/06/2015  . History of migraine headaches 10/06/2015  . Frequent PVCs 12/28/2013  . Premature atrial contractions 12/28/2013  . PSVT (paroxysmal supraventricular tachycardia) ([place]) 12/28/2013  . Heart palpitations 07/13/2013  . Sleep apnea 04/11/2013  . Scoliosis 04/11/2013  . Atrial flutter with rapid ventricular response ([place]) 11/29/2012  . Chest pain, atypical 11/29/2012  . Fibromyalgia syndrome 11/29/2012  . Chronic steroid use 11/29/2012     Janet Nguyen, PT 02/13/20 10:59 AM  [facility] [address] [place], [place], [address] Phone: [phone]   Fax:  [phone]  Name: MARZELL ALLEMAND MRN: [id number] Date of Birth: 1932-08-29

## 2020-02-16 ENCOUNTER — Ambulatory Visit: Payer: Medicare Other

## 2020-02-20 ENCOUNTER — Ambulatory Visit: Payer: Medicare Other | Attending: Obstetrics & Gynecology

## 2020-02-20 ENCOUNTER — Encounter: Payer: Self-pay | Admitting: Family Medicine

## 2020-02-20 ENCOUNTER — Ambulatory Visit (INDEPENDENT_AMBULATORY_CARE_PROVIDER_SITE_OTHER): Payer: Medicare Other | Admitting: Family Medicine

## 2020-02-20 ENCOUNTER — Other Ambulatory Visit: Payer: Self-pay

## 2020-02-20 ENCOUNTER — Other Ambulatory Visit: Payer: Self-pay | Admitting: Cardiovascular Disease

## 2020-02-20 VITALS — BP 147/81 | HR 75 | Ht 61.0 in | Wt 118.0 lb

## 2020-02-20 DIAGNOSIS — G4733 Obstructive sleep apnea (adult) (pediatric): Secondary | ICD-10-CM | POA: Diagnosis not present

## 2020-02-20 DIAGNOSIS — R293 Abnormal posture: Secondary | ICD-10-CM | POA: Diagnosis not present

## 2020-02-20 DIAGNOSIS — M4125 Other idiopathic scoliosis, thoracolumbar region: Secondary | ICD-10-CM | POA: Diagnosis not present

## 2020-02-20 DIAGNOSIS — M62830 Muscle spasm of back: Secondary | ICD-10-CM | POA: Insufficient documentation

## 2020-02-20 DIAGNOSIS — G8929 Other chronic pain: Secondary | ICD-10-CM

## 2020-02-20 DIAGNOSIS — M6281 Muscle weakness (generalized): Secondary | ICD-10-CM

## 2020-02-20 DIAGNOSIS — M545 Low back pain: Secondary | ICD-10-CM | POA: Diagnosis not present

## 2020-02-20 DIAGNOSIS — M542 Cervicalgia: Secondary | ICD-10-CM | POA: Diagnosis not present

## 2020-02-20 DIAGNOSIS — G43109 Migraine with aura, not intractable, without status migrainosus: Secondary | ICD-10-CM

## 2020-02-20 NOTE — Progress Notes (Addendum)
PATIENT: Janet Nguyen DOB: [birth year]  REASON FOR VISIT: follow up HISTORY FROM: patient  Chief Complaint  Patient presents with  . Follow-up    rm 1 here for a f/u on migraines. Pt is having continuing issues nothing new     HISTORY OF PRESENT ILLNESS: Today 02/20/20 Janet Nguyen is a 86 y.o. female here today for follow up for migraines. She was last seen by Dr Jaynee Eagles in 09/2019 and educated on several possible treatment options for migraines including Botox and CGRP therapy. She has been uncertain about starting these treatments. She is afraid to take any new medications. She has been getting PT for neck and back pain. She has had massage  Therapy as well that helped better than anything else she has tried.   She was diagnosed with sleep apnea with AHI of 10.2 but she has not been able to tolerate CPAP therapy due to claustrophobia. Dental device was used for a period of time but was discontinued due to concerns of her teeth shifting. She was advised by Dr Brett Fairy to consider referral to ENT for consideration of Inspire therapy. She does not feel that this is a good option for her.   HISTORY: (copied from Dr Cathren Laine note on 10/17/2019)  HPI:  Janet Nguyen is a 86 y.o. female here as requested by Lawerance Cruel, MD for migraines.  She has a past medical history of atrial flutter, paroxysmal supraventricular tachycardia, pulmonary hypertension, migraine with aura, sleep apnea, sciatica of the right side, scoliosis, fibromyalgia.  I reviewed neurology notes, patient is seeing Dr. Brett Fairy for her sleep disorder, I reviewed Dr. Edwena Felty last note which was several months ago, attempts were being made to help her with tolerating the CPAP with nasal pillows or nasal cradle because she is claustrophobic, and she was sent to me for migraines.  In the past AHI was 10.2, but she could never tolerate CPAP, she also tried a dental device, she also has chronic pain, degenerative  disc disease, scoliosis, muscle and joint aches.  She does have a history of ophthalmic migraines, seen by my other colleague Dr. Leta Baptist in the past and she also follow-up with Dr. Enriqueta Shutter.  I did review notes from my colleague Dr. Suzie Portela who saw her in 2016, she reports the head and neck pain developing 2011, tried physical therapy, Celebrex, narcotics, she takes Tylenol as needed for pain, she has followed with Dr. Herma Mering and pain management at least in the past.  I do see evidence in the chart that she saw Dr. Vira Agar in March 2017, April 2016 and possibly April 2012 and however I do not have access to the full notes, Dr. Vira Agar is a neurologist and she saw him for migraines.  I also do see notes from ENT where she was diagnosed with ophthalmic migraines, the last note I see is from September 2020 where she feels her migraine auras have become more continuous, she has headaches and increased postnasal drainage, she has episodic vertigo, and she was encouraged to talk to Dr. Brett Fairy, she was advised to stop Ambien.  I reviewed MRI of the brain images from February 2017 which appears to be last time she had brain imaging which was largely unremarkable.  She is here alone and she was referred for migraines with aura, she was diagnosed with sleep apnea and she saw Dr. Brett Fairy but she does not want to see her again, she also saw Dr. Leta Baptist in the  past and now she is here with her 3rd neurologist and I will try my best to help her. She talks a lot about her sleep apnea with me however I am not a sleep doctor and I cannot help her in this arena. She says her throat is always sore, she sees Dr. Erik Obey for this. She also gets sores in her mouth and has raspy throat. She has had problems with her left carotid artery and she went to see Dr. Harrington Challenger and she has constant musculular pain, her back is "warped" and she was seeing Dr. Nelva Bush. She did yoga in the past and keeping the scoliosis at [place], she was  getting injections by Dr. Nelva Bush but she is worsening, she is an avid walker. She has worsening scoliosis. She has multiple pain complaints. She spends much time complaining today about our office and I did apologize to her she says she has not been happy. She is here for migraines however, she will have migraines daily for several months then skip a month. She gets a visual aura, unknown triggers, she sees white boxes, she takes a tylenol and sits in the chair for 30 minutes. She has a calm app on her phone. She can have multiple in a day. She sees figures sometimes, she can look at the floor and it moves, The aura is always followed by head pain in the back of the head, she has chronic neck pain daily all day, it can be throbbing and pulsating she can almost feel it, lasts about 30 minutes followed by a headache. Ongoing 5-10 years without changes in frequency or   Reviewed notes, labs and imaging from outside physicians, which showed:  MRI brain 2017: There is no evidence of acute infarct, intracranial hemorrhage, mass, midline shift, or extra-axial fluid collection. Ventricles and sulci are normal for age. Foci of T2 hyperintensity in the subcortical and deep cerebral white matter are nonspecific but compatible with mild chronic small vessel ischemic disease. A subcentimeter pineal cyst is again noted without significant mass effect. Prior bilateral cataract extraction is noted. Paranasal sinuses and mastoid air cells are clear. Major intracranial vascular flow voids are preserved.  IMPRESSION: 1. No acute intracranial abnormality. 2. Mild chronic small vessel ischemic disease.   REVIEW OF SYSTEMS: Out of a complete 14 system review of symptoms, the patient complains only of the following symptoms, headaches, neck pain, seasonal allergies, fullness of the ears, aura, and all other reviewed systems are negative.  ALLERGIES: Allergies  Allergen Reactions  . Augmentin [Amoxicillin-Pot Clavulanate]      Has never had, son allergic   . Gluten Meal     Unknown  . Naproxen     Stomach upset  . Codeine Nausea Only    HOME MEDICATIONS: Outpatient Medications Prior to Visit  Medication Sig Dispense Refill  . ALPRAZolam (XANAX) 0.5 MG tablet Take 0.5 mg by mouth as needed.    Marland Kitchen amLODipine (NORVASC) 5 MG tablet Take 1 tablet (5 mg total) by mouth daily. 90 tablet 2  . clidinium-chlordiazePOXIDE (LIBRAX) 5-2.5 MG capsule Take 1 capsule by mouth as needed.    Noelle Penner ACETAMINOPHEN PO Take 500 mg by mouth as needed (pain).    Marland Kitchen FLUTICASONE PROPIONATE, NASAL, NA Place into the nose daily as needed (rhinitis).    . Lactobacillus (DIGESTIVE HEALTH PROBIOTIC PO) Take by mouth every other day.    . metoprolol succinate (TOPROL-XL) 50 MG 24 hr tablet TAKE 1 AND 1/2 TABLETS BY MOUTH TWO  TIMES A DAY 270 tablet 0  . metoprolol tartrate (LOPRESSOR) 25 MG tablet Take 25 mg by mouth. Uses rarely as needed for atrial flutters    . Multiple Vitamins-Minerals (PRESERVISION AREDS 2 PO) Take 1 tablet by mouth every other day.    . pantoprazole (PROTONIX) 20 MG tablet Take 20 mg by mouth daily.     Marland Kitchen Phenylephrine-APAP-guaiFENesin (EQ SINUS CONGESTION & PAIN PO) Take by mouth as needed (for sinus pain). Contains acetaminophen 325 mg and Phenylephrine 5 mg    . Polyvinyl Alcohol-Povidone (REFRESH OP) Apply to eye at bedtime.     Marland Kitchen UNABLE TO FIND Take by mouth. Med Name: [place] WOMEN'S GUMMIES ONCE-TWICE DAILY    . UNABLE TO FIND Med Name: VITAFUSION CALCIUM PLUS D CHEWABLES, ONCE-TWICE DAILY    . UNABLE TO FIND Med Name: VITAFUSION OMEGA-3, ONCE-TWICE DAILY    . UNABLE TO FIND 1,000 mcg daily. Med Name: [place] BIOTIN    . UNABLE TO FIND 2 (two) times daily. Med Name: EQUATE FIBER POWDER/MIRALAX    . UNABLE TO FIND as needed (itching ears). Med Name: FLUOCINONIDE TOPICAL SOLUTION (DROPS)    . ZOLPIDEM TARTRATE ER PO Take 2.5 mg by mouth at bedtime.     No facility-administered medications prior to  visit.    PAST MEDICAL HISTORY: Past Medical History:  Diagnosis Date  . Arrhythmia    History of SVT with documented PVC'S and  PAC'S  12/08/12 Nuc stress test normal LV EF 74%  Event Monitor  12/01/12-01/03/13  . Atrial flutter ([place])   . Celiac disease    treated by Dr. [name]  . GERD (gastroesophageal reflux disease)   . Intervertebral disc stenosis of neural canal of cervical region   . Irregular heart beat 11/30/12   ECHO-EF 60-65%  . Osteoporosis   . PMR (polymyalgia rheumatica) (HCC)    Dr. Marijean Bravo; pt states she was diagnosed 10-15 years ago, not treated at this time or any issues that she is aware of.  . Scoliosis   . Scoliosis   . Sleep apnea 10/02/11 [facility]   Sleep study AHI -total sleep 10.3/hr  64.0/ hr during REM sleep.RDI 22.8/hr during total sleep 64.0/hr during REM sleep The lowest O2 sat during Non-REM and REM sleep was 86% and 88% respectively. 04/08/12 CPAP/BIPAP titration study [facility]    PAST SURGICAL HISTORY: Past Surgical History:  Procedure Laterality Date  . APPENDECTOMY     ruptured at age 46 and had surgery  . CARDIAC CATHETERIZATION  01/27/06  . cataract surgery  2015   Dr. Herbert Deaner; March & April 2015    FAMILY HISTORY: Family History  Problem Relation Age of Onset  . Breast cancer Mother   . Heart disease Father   . Migraines Neg Hx     SOCIAL HISTORY: Social History   Socioeconomic History  . Marital status: Divorced    Spouse name: Not on file  . Number of children: 4  . Years of education: college   . Highest education level: Not on file  Occupational History  . Occupation: retired   Tobacco Use  . Smoking status: Never Smoker  . Smokeless tobacco: Never Used  Substance and Sexual Activity  . Alcohol use: Yes    Alcohol/week: 1.0 standard drinks    Types: 1 Standard drinks or equivalent per week    Comment: drink ETOH socially  . Drug use: No  . Sexual activity: Not on file  Other  Topics Concern  . Not on file  Social History Narrative   Lives alone at home   Drinks tea but tries to drink decaf   Has 9 grandchildren    Right handed   Social Determinants of Health   Financial Resource Strain:   . Difficulty of Paying Living Expenses:   Food Insecurity:   . Worried About Charity fundraiser in the Last Year:   . Arboriculturist in the Last Year:   Transportation Needs:   . Film/video editor (Medical):   Marland Kitchen Lack of Transportation (Non-Medical):   Physical Activity:   . Days of Exercise per Week:   . Minutes of Exercise per Session:   Stress:   . Feeling of Stress :   Social Connections:   . Frequency of Communication with Friends and Family:   . Frequency of Social Gatherings with Friends and Family:   . Attends Religious Services:   . Active Member of Clubs or Organizations:   . Attends Archivist Meetings:   Marland Kitchen Marital Status:   Intimate Partner Violence:   . Fear of Current or Ex-Partner:   . Emotionally Abused:   Marland Kitchen Physically Abused:   . Sexually Abused:       PHYSICAL EXAM  Vitals:   02/20/20 1249  BP: (!) 147/81  Pulse: 75  Weight: 118 lb (53.5 kg)  Height: 5' 1"  (1.549 m)   Body mass index is 22.3 kg/m.  Generalized: Well developed, in no acute distress  Cardiology: normal rate and rhythm, no murmur noted Respiratory: clear to auscultation bilaterally  Neurological examination  Mentation: Alert oriented to time, place, history taking. Follows all commands speech and language fluent Cranial nerve II-XII: Pupils were equal round reactive to light. Extraocular movements were full, visual field were full on confrontational test. Facial sensation and strength were normal. Uvula tongue midline. Head turning and shoulder shrug  were normal and symmetric. Motor: The motor testing reveals 5 over 5 strength of all 4 extremities. Good symmetric motor tone is noted throughout.  Sensory: Sensory testing is intact to soft touch on  all 4 extremities. No evidence of extinction is noted.  Coordination: Cerebellar testing reveals good finger-nose-finger  Gait and station: Gait is normal.    DIAGNOSTIC DATA (LABS, IMAGING, TESTING) - I reviewed patient records, labs, notes, testing and imaging myself where available.  No flowsheet data found.   Lab Results  Component Value Date   WBC 9.2 12/05/2017   HGB 12.0 12/05/2017   HCT 36.0 12/05/2017   MCV 93.5 12/05/2017   PLT 288 12/05/2017      Component Value Date/Time   NA 136 12/05/2017 2029   K 3.8 12/05/2017 2029   CL 100 (L) 12/05/2017 2029   CO2 26 12/05/2017 2029   GLUCOSE 129 (H) 12/05/2017 2029   BUN 13 12/05/2017 2029   CREATININE 0.87 12/05/2017 2029   CREATININE 0.85 12/03/2016 0845   CALCIUM 9.6 12/05/2017 2029   PROT 7.6 12/05/2017 2029   ALBUMIN 4.4 12/05/2017 2029   AST 30 12/05/2017 2029   ALT 18 12/05/2017 2029   ALKPHOS 55 12/05/2017 2029   BILITOT 0.6 12/05/2017 2029   GFRNONAA 60 (L) 12/05/2017 2029   GFRAA >60 12/05/2017 2029   Lab Results  Component Value Date   CHOL 154 12/03/2016   HDL 67 12/03/2016   LDLCALC 71 12/03/2016   TRIG 81 12/03/2016   CHOLHDL 2.3 12/03/2016   No results found  for: HGBA1C No results found for: VITAMINB12 Lab Results  Component Value Date   TSH 1.07 12/03/2016       ASSESSMENT AND PLAN 86 y.o. year old female  has a past medical history of Arrhythmia, Atrial flutter ([place]), Celiac disease, GERD (gastroesophageal reflux disease), Intervertebral disc stenosis of neural canal of cervical region, Irregular heart beat (11/30/12), Osteoporosis, PMR (polymyalgia rheumatica) ([place]), Scoliosis, Scoliosis, and Sleep apnea (10/02/11 [facility]). here with     ICD-10-CM   1. Migraine with aura and without status migrainosus, not intractable  G43.109   2. Obstructive sleep apnea syndrome  G47.33     Cyrah returns today with continued concerns of persistent headaches with migrainous  symptoms, sometimes preceded with visual aura.  She is very hesitant about adding any additional medications at this time.  I have reiterated Dr. Cathren Laine previous conversation regarding different medications we could use to help manage headaches.  We have also discussed the possibility of adding a low-dose of gabapentin which may be helpful for her headaches and neck pain.  Educational materials provided on both gabapentin and Ajovy per her request.  I have also encouraged that she continue complementary therapies if beneficial.  She may continue Tylenol as needed for abortive therapy.  Healthy lifestyle habits with well-balanced diet, regular exercise and adequate hydration encouraged.  She will return to see me as needed should she decide to start a preventative.  She verbalizes understanding and agreement with this plan.   No orders of the defined types were placed in this encounter.    No orders of the defined types were placed in this encounter.     I spent 30 minutes with the patient. 50% of this time was spent counseling and educating patient on plan of care and medications.    Debbora Presto, FNP-C 02/20/2020, 1:47 PM [facility] [address], [place], [place] [address] [phone]  Made any corrections needed, and agree with history, physical, neuro exam,assessment and plan as stated.     Sarina Ill, MD [facility]

## 2020-02-20 NOTE — Therapy (Signed)
[facility] [address] [place], [place], [address] Phone: [phone]   Fax:  [phone]  Physical Therapy Treatment  Patient Details  Name: Janet Nguyen MRN: [id number] Date of Birth: [birth year] Referring Provider (PT): Melinda Crutch, MD   Encounter Date: 02/20/2020  PT End of Session - 02/20/20 1058    Visit Number  15    Date for PT Re-Evaluation  03/05/20    Authorization Type  Medicare    PT Start Time  1017    PT Stop Time  1057    PT Time Calculation (min)  40 min    Activity Tolerance  Patient tolerated treatment well    Behavior During Therapy  [facility] for tasks assessed/performed       Past Medical History:  Diagnosis Date  . Arrhythmia    History of SVT with documented PVC'S and  PAC'S  12/08/12 Nuc stress test normal LV EF 74%  Event Monitor  12/01/12-01/03/13  . Atrial flutter ([place])   . Celiac disease    treated by Dr. [name]  . GERD (gastroesophageal reflux disease)   . Intervertebral disc stenosis of neural canal of cervical region   . Irregular heart beat 11/30/12   ECHO-EF 60-65%  . Osteoporosis   . PMR (polymyalgia rheumatica) (HCC)    Dr. Marijean Bravo; pt states she was diagnosed 10-15 years ago, not treated at this time or any issues that she is aware of.  . Scoliosis   . Scoliosis   . Sleep apnea 10/02/11 [facility]   Sleep study AHI -total sleep 10.3/hr  64.0/ hr during REM sleep.RDI 22.8/hr during total sleep 64.0/hr during REM sleep The lowest O2 sat during Non-REM and REM sleep was 86% and 88% respectively. 04/08/12 CPAP/BIPAP titration study [facility]    Past Surgical History:  Procedure Laterality Date  . APPENDECTOMY     ruptured at age 62 and had surgery  . CARDIAC CATHETERIZATION  01/27/06  . cataract surgery  2015   Dr. Herbert Deaner; March & April 2015    There were no vitals filed for this visit.  Subjective Assessment - 02/20/20 1021     Subjective  I bumped my back on my way out of the house.    Currently in Pain?  Yes    Pain Score  4     Pain Location  Back    Pain Orientation  Right;Left    Pain Descriptors / Indicators  Aching    Pain Onset  More than a month ago    Pain Frequency  Constant    Aggravating Factors   morning hours    Pain Relieving Factors  therapy, tylenol, manual therapy                        [facility] Adult PT Treatment/Exercise - 02/20/20 0001      Lumbar Exercises: Standing   Other Standing Lumbar Exercises  core activation and rib mobility      Lumbar Exercises: Seated   Other Seated Lumbar Exercises  seated TA activation and diaphramatic breathing   verbal cues for technique      Manual Therapy   Manual Therapy  Myofascial release;Soft tissue mobilization    Manual therapy comments  soft tissue elongation to bil upper traps, suboccipitals and bil cervical paraspinals and lumbar paraspinals (sidelying) with pt propped on wedge.  PT Short Term Goals - 01/09/20 1106      PT SHORT TERM GOAL #1   Title  independent with postural correction exercises to elongate her trunk and separate rib cage from pelvis    Status  Achieved      PT SHORT TERM GOAL #2   Title  report a 20% reduction in LBP and neck pain with sitting        PT Long Term Goals - 01/23/20 1626      PT LONG TERM GOAL #1   Title  independent with postural strengthening exercises and flexibility exercises to address scoliosis    Baseline  50% compliance    Time  6    Period  Weeks    Status  On-going    Target Date  03/05/20      PT LONG TERM GOAL #2   Title  report a 40% reduction in neck and low back pain with sitting    Baseline  25% improvement in frequency    Time  6    Period  Weeks    Status  On-going    Target Date  03/05/20      PT LONG TERM GOAL #3   Title  improve cervical A/ROM to 50 degrees bilaterally to improve ability to look to the side with driving     Baseline  45 degrees bilaterally    Time  6    Period  Weeks    Status  On-going    Target Date  03/05/20      PT LONG TERM GOAL #4   Title  report 60-70% increased ease in standing erect due to improved flexibility and alignment    Time  6    Period  Weeks    Status  On-going    Target Date  03/05/20            Plan - 02/20/20 1026    Clinical Impression Statement  Pt reports improvement in her ability to stand up straight at home and with activity.    Pt demonstrates improved abdominal activation with exercises in standing.  Pt also showed pt how to press down on a table top in sitting to activate lower abdominals.   Pt with tenson in bil neck, lumbar and quadratus and responded well to manual therapy. Pt sat more erect through entire session today.   Pt will continue to benefit from skilled PT to address core strength, diaphragm and rib mobility and tissue mobility.    Rehab Potential  Good    PT Frequency  2x / week    PT Duration  6 weeks    PT Treatment/Interventions  ADLs/Self Care Home Management;Cryotherapy;Electrical Stimulation;Moist Heat;Neuromuscular re-education;Therapeutic exercise;Therapeutic activities;Functional mobility training;Patient/family education;Manual techniques;Dry needling;Passive range of motion;Taping    PT Next Visit Plan  continue manaul therapy to elongate, HEP to activate lower abdominals    PT Home Exercise Plan  Access Code: [email or web address]    Consulted and Agree with Plan of Care  Patient       Patient will benefit from skilled therapeutic intervention in order to improve the following deficits and impairments:  Decreased activity tolerance, Decreased mobility, Decreased strength, Postural dysfunction, Improper body mechanics, Impaired flexibility, Pain, Increased muscle spasms, Decreased endurance, Decreased range of motion  Visit Diagnosis: Abnormal posture  Other idiopathic scoliosis, thoracolumbar region  Muscle weakness  (generalized)  Muscle spasm of back  Chronic bilateral low back pain without sciatica  Cervicalgia     Problem  List Patient Active Problem List   Diagnosis Date Noted  . Pulmonary hypertension, unspecified ([place]) 04/14/2019  . Ischemic colitis ([place]) 04/14/2019  . Migraine with aura and without status migrainosus, not intractable 11/08/2018  . Degeneration of lumbar intervertebral disc 10/14/2018  . Hoarseness of voice 03/04/2018  . Abdominal pain 07/01/2017  . Diverticulitis, colon   . Metabolic acidosis, increased anion gap   . Constipation 04/14/2017  . Lumbar hernia 04/14/2017  . Presbycusis of both ears 01/10/2017  . Tinnitus aurium, bilateral 01/10/2017  . Gastroesophageal reflux disease 08/20/2016  . Hemoptysis 08/20/2016  . Obstructive sleep apnea of adult 08/20/2016  . Rhinitis, chronic 08/20/2016  . Throat pain in adult 08/20/2016  . Fatigue 12/23/2015  . Sciatica of right side 10/06/2015  . History of migraine headaches 10/06/2015  . Frequent PVCs 12/28/2013  . Premature atrial contractions 12/28/2013  . PSVT (paroxysmal supraventricular tachycardia) ([place]) 12/28/2013  . Heart palpitations 07/13/2013  . Sleep apnea 04/11/2013  . Scoliosis 04/11/2013  . Atrial flutter with rapid ventricular response ([place]) 11/29/2012  . Chest pain, atypical 11/29/2012  . Fibromyalgia syndrome 11/29/2012  . Chronic steroid use 11/29/2012    Sigurd Sos, PT 02/20/20 11:00 AM  [facility] [address], [place], [place], [address] Phone: [phone]   Fax:  [phone]  Name: Janet Nguyen MRN: [id number] Date of Birth: 02-27-1932

## 2020-02-20 NOTE — Patient Instructions (Addendum)
I would suggest we consider gabapentin 139m at bedtime for headaches and neck pain if you wish. You may also consider Ajovy injections every 30 days for migraine prevention. Continue Tylenol as needed for acute management of headaches.   Follow up as needed   Gabapentin capsules or tablets What is this medicine? GABAPENTIN (GA ba pen tin) is used to control seizures in certain types of epilepsy. It is also used to treat certain types of nerve pain. This medicine may be used for other purposes; ask your health care provider or pharmacist if you have questions. COMMON BRAND NAME(S): Active-PAC with Gabapentin, Gabarone, Neurontin What should I tell my health care provider before I take this medicine? They need to know if you have any of these conditions:  history of drug abuse or alcohol abuse problem  kidney disease  lung or breathing disease  suicidal thoughts, plans, or attempt; a previous suicide attempt by you or a family member  an unusual or allergic reaction to gabapentin, other medicines, foods, dyes, or preservatives  pregnant or trying to get pregnant  breast-feeding How should I use this medicine? Take this medicine by mouth with a glass of water. Follow the directions on the prescription label. You can take it with or without food. If it upsets your stomach, take it with food. Take your medicine at regular intervals. Do not take it more often than directed. Do not stop taking except on your doctor's advice. If you are directed to break the 600 or 800 mg tablets in half as part of your dose, the extra half tablet should be used for the next dose. If you have not used the extra half tablet within 28 days, it should be thrown away. A special MedGuide will be given to you by the pharmacist with each prescription and refill. Be sure to read this information carefully each time. Talk to your pediatrician regarding the use of this medicine in children. While this drug may be  prescribed for children as young as 3 years for selected conditions, precautions do apply. Overdosage: If you think you have taken too much of this medicine contact a poison control center or emergency room at once. NOTE: This medicine is only for you. Do not share this medicine with others. What if I miss a dose? If you miss a dose, take it as soon as you can. If it is almost time for your next dose, take only that dose. Do not take double or extra doses. What may interact with this medicine? This medicine may interact with the following medications:  alcohol  antihistamines for allergy, cough, and cold  certain medicines for anxiety or sleep  certain medicines for depression like amitriptyline, fluoxetine, sertraline  certain medicines for seizures like phenobarbital, primidone  certain medicines for stomach problems  general anesthetics like halothane, isoflurane, methoxyflurane, propofol  local anesthetics like lidocaine, pramoxine, tetracaine  medicines that relax muscles for surgery  narcotic medicines for pain  phenothiazines like chlorpromazine, mesoridazine, prochlorperazine, thioridazine This list may not describe all possible interactions. Give your health care provider a list of all the medicines, herbs, non-prescription drugs, or dietary supplements you use. Also tell them if you smoke, drink alcohol, or use illegal drugs. Some items may interact with your medicine. What should I watch for while using this medicine? Visit your doctor or health care provider for regular checks on your progress. You may want to keep a record at home of how you feel your condition is responding  to treatment. You may want to share this information with your doctor or health care provider at each visit. You should contact your doctor or health care provider if your seizures get worse or if you have any new types of seizures. Do not stop taking this medicine or any of your seizure medicines  unless instructed by your doctor or health care provider. Stopping your medicine suddenly can increase your seizures or their severity. This medicine may cause serious skin reactions. They can happen weeks to months after starting the medicine. Contact your health care provider right away if you notice fevers or flu-like symptoms with a rash. The rash may be red or purple and then turn into blisters or peeling of the skin. Or, you might notice a red rash with swelling of the face, lips or lymph nodes in your neck or under your arms. Wear a medical identification bracelet or chain if you are taking this medicine for seizures, and carry a card that lists all your medications. You may get drowsy, dizzy, or have blurred vision. Do not drive, use machinery, or do anything that needs mental alertness until you know how this medicine affects you. To reduce dizzy or fainting spells, do not sit or stand up quickly, especially if you are an older patient. Alcohol can increase drowsiness and dizziness. Avoid alcoholic drinks. Your mouth may get dry. Chewing sugarless gum or sucking hard candy, and drinking plenty of water will help. The use of this medicine may increase the chance of suicidal thoughts or actions. Pay special attention to how you are responding while on this medicine. Any worsening of mood, or thoughts of suicide or dying should be reported to your health care provider right away. Women who become pregnant while using this medicine may enroll in the [place] Pregnancy Registry by calling [phone]. This registry collects information about the safety of antiepileptic drug use during pregnancy. What side effects may I notice from receiving this medicine? Side effects that you should report to your doctor or health care professional as soon as possible:  allergic reactions like skin rash, itching or hives, swelling of the face, lips, or tongue  breathing problems  rash,  fever, and swollen lymph nodes  redness, blistering, peeling or loosening of the skin, including inside the mouth  suicidal thoughts, mood changes Side effects that usually do not require medical attention (report to your doctor or health care professional if they continue or are bothersome):  dizziness  drowsiness  headache  nausea, vomiting  swelling of ankles, feet, hands  tiredness This list may not describe all possible side effects. Call your doctor for medical advice about side effects. You may report side effects to FDA at 1-800-FDA-1088. Where should I keep my medicine? Keep out of reach of children. This medicine may cause accidental overdose and death if it taken by other adults, children, or pets. Mix any unused medicine with a substance like cat litter or coffee grounds. Then throw the medicine away in a sealed container like a sealed bag or a coffee can with a lid. Do not use the medicine after the expiration date. Store at room temperature between 15 and 30 degrees C (59 and 86 degrees F). NOTE: This sheet is a summary. It may not cover all possible information. If you have questions about this medicine, talk to your doctor, pharmacist, or health care provider.  2020 Elsevier/Gold Standard (2018-12-17 14:16:43)   Rolanda Lundborg injection What is this medicine? FREMANEZUMAB (fre  ma NEZ ue mab) is used to prevent migraine headaches. This medicine may be used for other purposes; ask your health care provider or pharmacist if you have questions. COMMON BRAND NAME(S): AJOVY What should I tell my health care provider before I take this medicine? They need to know if you have any of these conditions:  an unusual or allergic reaction to fremanezumab, other medicines, foods, dyes, or preservatives  pregnant or trying to get pregnant  breast-feeding How should I use this medicine? This medicine is for injection under the skin. You will be taught how to prepare and give  this medicine. Use exactly as directed. Take your medicine at regular intervals. Do not take your medicine more often than directed. It is important that you put your used needles and syringes in a special sharps container. Do not put them in a trash can. If you do not have a sharps container, call your pharmacist or healthcare provider to get one. Talk to your pediatrician regarding the use of this medicine in children. Special care may be needed. Overdosage: If you think you have taken too much of this medicine contact a poison control center or emergency room at once. NOTE: This medicine is only for you. Do not share this medicine with others. What if I miss a dose? If you miss a dose, take it as soon as you can. If it is almost time for your next dose, take only that dose. Do not take double or extra doses. What may interact with this medicine? Interactions are not expected. This list may not describe all possible interactions. Give your health care provider a list of all the medicines, herbs, non-prescription drugs, or dietary supplements you use. Also tell them if you smoke, drink alcohol, or use illegal drugs. Some items may interact with your medicine. What should I watch for while using this medicine? Tell your doctor or healthcare professional if your symptoms do not start to get better or if they get worse. What side effects may I notice from receiving this medicine? Side effects that you should report to your doctor or health care professional as soon as possible:  allergic reactions like skin rash, itching or hives, swelling of the face, lips, or tongue Side effects that usually do not require medical attention (report these to your doctor or health care professional if they continue or are bothersome):  pain, redness, or irritation at site where injected This list may not describe all possible side effects. Call your doctor for medical advice about side effects. You may report side  effects to FDA at 1-800-FDA-1088. Where should I keep my medicine? Keep out of the reach of children. You will be instructed on how to store this medicine. Throw away any unused medicine after the expiration date on the label. NOTE: This sheet is a summary. It may not cover all possible information. If you have questions about this medicine, talk to your doctor, pharmacist, or health care provider.  2020 Elsevier/Gold Standard (2017-06-15 17:22:56)   Migraine Headache A migraine headache is a very strong throbbing pain on one side or both sides of your head. This type of headache can also cause other symptoms. It can last from 4 hours to 3 days. Talk with your doctor about what things may bring on (trigger) this condition. What are the causes? The exact cause of this condition is not known. This condition may be triggered or caused by:  Drinking alcohol.  Smoking.  Taking medicines, such as: ?  Medicine used to treat chest pain (nitroglycerin). ? Birth control pills. ? Estrogen. ? Some blood pressure medicines.  Eating or drinking certain products.  Doing physical activity. Other things that may trigger a migraine headache include:  Having a menstrual period.  Pregnancy.  Hunger.  Stress.  Not getting enough sleep or getting too much sleep.  Weather changes.  Tiredness (fatigue). What increases the risk?  Being 65-49 years old.  Being female.  Having a family history of migraine headaches.  Being Caucasian.  Having depression or anxiety.  Being very overweight. What are the signs or symptoms?  A throbbing pain. This pain may: ? Happen in any area of the head, such as on one side or both sides. ? Make it hard to do daily activities. ? Get worse with physical activity. ? Get worse around bright lights or loud noises.  Other symptoms may include: ? Feeling sick to your stomach (nauseous). ? Vomiting. ? Dizziness. ? Being sensitive to bright lights, loud  noises, or smells.  Before you get a migraine headache, you may get warning signs (an aura). An aura may include: ? Seeing flashing lights or having blind spots. ? Seeing bright spots, halos, or zigzag lines. ? Having tunnel vision or blurred vision. ? Having numbness or a tingling feeling. ? Having trouble talking. ? Having weak muscles.  Some people have symptoms after a migraine headache (postdromal phase), such as: ? Tiredness. ? Trouble thinking (concentrating). How is this treated?  Taking medicines that: ? Relieve pain. ? Relieve the feeling of being sick to your stomach. ? Prevent migraine headaches.  Treatment may also include: ? Having acupuncture. ? Avoiding foods that bring on migraine headaches. ? Learning ways to control your body functions (biofeedback). ? Therapy to help you know and deal with negative thoughts (cognitive behavioral therapy). Follow these instructions at home: Medicines  Take over-the-counter and prescription medicines only as told by your doctor.  Ask your doctor if the medicine prescribed to you: ? Requires you to avoid driving or using heavy machinery. ? Can cause trouble pooping (constipation). You may need to take these steps to prevent or treat trouble pooping:  Drink enough fluid to keep your pee (urine) pale yellow.  Take over-the-counter or prescription medicines.  Eat foods that are high in fiber. These include beans, whole grains, and fresh fruits and vegetables.  Limit foods that are high in fat and sugar. These include fried or sweet foods. Lifestyle  Do not drink alcohol.  Do not use any products that contain nicotine or tobacco, such as cigarettes, e-cigarettes, and chewing tobacco. If you need help quitting, ask your doctor.  Get at least 8 hours of sleep every night.  Limit and deal with stress. General instructions      Keep a journal to find out what may bring on your migraine headaches. For example, write  down: ? What you eat and drink. ? How much sleep you get. ? Any change in what you eat or drink. ? Any change in your medicines.  If you have a migraine headache: ? Avoid things that make your symptoms worse, such as bright lights. ? It may help to lie down in a dark, quiet room. ? Do not drive or use heavy machinery. ? Ask your doctor what activities are safe for you.  Keep all follow-up visits as told by your doctor. This is important. Contact a doctor if:  You get a migraine headache that is different or worse than  others you have had.  You have more than 15 headache days in one month. Get help right away if:  Your migraine headache gets very bad.  Your migraine headache lasts longer than 72 hours.  You have a fever.  You have a stiff neck.  You have trouble seeing.  Your muscles feel weak or like you cannot control them.  You start to lose your balance a lot.  You start to have trouble walking.  You pass out (faint).  You have a seizure. Summary  A migraine headache is a very strong throbbing pain on one side or both sides of your head. These headaches can also cause other symptoms.  This condition may be treated with medicines and changes to your lifestyle.  Keep a journal to find out what may bring on your migraine headaches.  Contact a doctor if you get a migraine headache that is different or worse than others you have had.  Contact your doctor if you have more than 15 headache days in a month. This information is not intended to replace advice given to you by your health care provider. Make sure you discuss any questions you have with your health care provider. Document Revised: 01/07/2019 Document Reviewed: 10/28/2018 Elsevier Patient Education  [place].    Sleep Apnea Sleep apnea affects breathing during sleep. It causes breathing to stop for a short time or to become shallow. It can also increase the risk of: Heart  attack. Stroke. Being very overweight (obese). Diabetes. Heart failure. Irregular heartbeat. The goal of treatment is to help you breathe normally again. What are the causes? There are three kinds of sleep apnea: Obstructive sleep apnea. This is caused by a blocked or collapsed airway. Central sleep apnea. This happens when the brain does not send the right signals to the muscles that control breathing. Mixed sleep apnea. This is a combination of obstructive and central sleep apnea. The most common cause of this condition is a collapsed or blocked airway. This can happen if: Your throat muscles are too relaxed. Your tongue and tonsils are too large. You are overweight. Your airway is too small. What increases the risk? Being overweight. Smoking. Having a small airway. Being older. Being female. Drinking alcohol. Taking medicines to calm yourself (sedatives or tranquilizers). Having family members with the condition. What are the signs or symptoms? Trouble staying asleep. Being sleepy or tired during the day. Getting angry a lot. Loud snoring. Headaches in the morning. Not being able to focus your mind (concentrate). Forgetting things. Less interest in sex. Mood swings. Personality changes. Feelings of sadness (depression). Waking up a lot during the night to pee (urinate). Dry mouth. Sore throat. How is this diagnosed? Your medical history. A physical exam. A test that is done when you are sleeping (sleep study). The test is most often done in a sleep lab but may also be done at home. How is this treated?  Sleeping on your side. Using a medicine to get rid of mucus in your nose (decongestant). Avoiding the use of alcohol, medicines to help you relax, or certain pain medicines (narcotics). Losing weight, if needed. Changing your diet. Not smoking. Using a machine to open your airway while you sleep, such as: An oral appliance. This is a mouthpiece that shifts your  lower jaw forward. A CPAP device. This device blows air through a mask when you breathe out (exhale). An EPAP device. This has valves that you put in each nostril. A BPAP  device. This device blows air through a mask when you breathe in (inhale) and breathe out. Having surgery if other treatments do not work. It is important to get treatment for sleep apnea. Without treatment, it can lead to: High blood pressure. Coronary artery disease. In men, not being able to have an erection (impotence). Reduced thinking ability. Follow these instructions at home: Lifestyle Make changes that your doctor recommends. Eat a healthy diet. Lose weight if needed. Avoid alcohol, medicines to help you relax, and some pain medicines. Do not use any products that contain nicotine or tobacco, such as cigarettes, e-cigarettes, and chewing tobacco. If you need help quitting, ask your doctor. General instructions Take over-the-counter and prescription medicines only as told by your doctor. If you were given a machine to use while you sleep, use it only as told by your doctor. If you are having surgery, make sure to tell your doctor you have sleep apnea. You may need to bring your device with you. Keep all follow-up visits as told by your doctor. This is important. Contact a doctor if: The machine that you were given to use during sleep bothers you or does not seem to be working. You do not get better. You get worse. Get help right away if: Your chest hurts. You have trouble breathing in enough air. You have an uncomfortable feeling in your back, arms, or stomach. You have trouble talking. One side of your body feels weak. A part of your face is hanging down. These symptoms may be an emergency. Do not wait to see if the symptoms will go away. Get medical help right away. Call your local emergency services (911 in the [place] Do not drive yourself to the hospital. Summary This condition affects breathing during  sleep. The most common cause is a collapsed or blocked airway. The goal of treatment is to help you breathe normally while you sleep. This information is not intended to replace advice given to you by your health care provider. Make sure you discuss any questions you have with your health care provider. Document Revised: 07/02/2018 Document Reviewed: 05/11/2018 Elsevier Patient Education  [place].

## 2020-02-23 ENCOUNTER — Ambulatory Visit: Payer: Medicare Other | Admitting: Physical Therapy

## 2020-02-23 ENCOUNTER — Other Ambulatory Visit: Payer: Self-pay

## 2020-02-23 DIAGNOSIS — M62830 Muscle spasm of back: Secondary | ICD-10-CM

## 2020-02-23 DIAGNOSIS — R293 Abnormal posture: Secondary | ICD-10-CM | POA: Diagnosis not present

## 2020-02-23 DIAGNOSIS — M6281 Muscle weakness (generalized): Secondary | ICD-10-CM

## 2020-02-23 DIAGNOSIS — M4125 Other idiopathic scoliosis, thoracolumbar region: Secondary | ICD-10-CM | POA: Diagnosis not present

## 2020-02-23 DIAGNOSIS — M545 Low back pain: Secondary | ICD-10-CM | POA: Diagnosis not present

## 2020-02-23 DIAGNOSIS — G8929 Other chronic pain: Secondary | ICD-10-CM | POA: Diagnosis not present

## 2020-02-23 NOTE — Therapy (Signed)
[facility] [address] [place], [place], [address] Phone: [phone]   Fax:  [phone]  Physical Therapy Treatment/Discharge Summary   Patient Details  Name: Janet Nguyen MRN: [id number] Date of Birth: 1931/12/19 Referring Provider (PT): Melinda Crutch, MD   Encounter Date: 02/23/2020  PT End of Session - 02/23/20 1728    Visit Number  16    Date for PT Re-Evaluation  03/05/20    Authorization Type  Medicare    Progress Note Due on Visit  7    PT Start Time  4888    PT Stop Time  1050   discharge visit   PT Time Calculation (min)  35 min    Activity Tolerance  Patient tolerated treatment well       Past Medical History:  Diagnosis Date  . Arrhythmia    History of SVT with documented PVC'S and  PAC'S  12/08/12 Nuc stress test normal LV EF 74%  Event Monitor  12/01/12-01/03/13  . Atrial flutter ([place])   . Celiac disease    treated by Dr. [name]  . GERD (gastroesophageal reflux disease)   . Intervertebral disc stenosis of neural canal of cervical region   . Irregular heart beat 11/30/12   ECHO-EF 60-65%  . Osteoporosis   . PMR (polymyalgia rheumatica) (HCC)    Dr. Marijean Bravo; pt states she was diagnosed 10-15 years ago, not treated at this time or any issues that she is aware of.  . Scoliosis   . Scoliosis   . Sleep apnea 10/02/11 [facility]   Sleep study AHI -total sleep 10.3/hr  64.0/ hr during REM sleep.RDI 22.8/hr during total sleep 64.0/hr during REM sleep The lowest O2 sat during Non-REM and REM sleep was 86% and 88% respectively. 04/08/12 CPAP/BIPAP titration study [facility]    Past Surgical History:  Procedure Laterality Date  . APPENDECTOMY     ruptured at age 16 and had surgery  . CARDIAC CATHETERIZATION  01/27/06  . cataract surgery  2015   Dr. Herbert Deaner; March & April 2015    There were no vitals filed for this visit.  Subjective Assessment - 02/23/20  1017    Subjective  Today is my last day.  I'll be starting pelvic pain treatment [place].   This has helped a lot.  I live alone so this helps me be able to do some things.     Currently in Pain?  Yes    Pain Score  7     Pain Location  Neck         [facility] PT Assessment - 02/23/20 0001      Assessment   Medical Diagnosis  low back pain, neck pain, thoracic pain    Referring Provider (PT)  Melinda Crutch, MD      AROM   Cervical - Right Rotation  50    Cervical - Left Rotation  50                    [facility] Adult PT Treatment/Exercise - 02/23/20 0001      Manual Therapy   Soft tissue mobilization  seated soft tissue     Manual Traction  gentle seated cervical distraction 5x 10 sec; soft tissue work to bil upper traps, thoracic paraspinals, periscapular muscles, bil lumbar musculature with focus on elongating shortened side of scoliosis              PT  Short Term Goals - 02/23/20 1020      PT SHORT TERM GOAL #1   Title  independent with postural correction exercises to elongate her trunk and separate rib cage from pelvis    Status  Achieved        PT Long Term Goals - 02/23/20 1020      PT LONG TERM GOAL #1   Title  independent with postural strengthening exercises and flexibility exercises to address scoliosis    Status  Achieved      PT LONG TERM GOAL #2   Title  report a 40% reduction in neck and low back pain with sitting    Baseline  mentally I'm 100% better but some bad days    Status  Not Met      PT LONG TERM GOAL #3   Title  improve cervical A/ROM to 50 degrees bilaterally to improve ability to look to the side with driving    Status  Achieved      PT LONG TERM GOAL #4   Title  report 60-70% increased ease in standing erect due to improved flexibility and alignment    Status  Not Met            Plan - 02/23/20 1529    Clinical Impression Statement  The patient is unable to rate her overall percentage improvement since start of  care.  She does report pain relief with manual techniques with focus on cervical distraction, suboccipital release and muscle elongation strategies.  Progress limited secondary to advanced age, chronic pain related to severe scoliosis.  She has been instructed in self care strategies and exercises for spinal elongation.  She is in agreement with discharge for this episode of care but plans to begin pelvic floor PT shortly.  Will discharge from PT at this time with partial goals met.      PHYSICAL THERAPY DISCHARGE SUMMARY  Visits from Start of Care: 16  Current functional level related to goals / functional outcomes: See clinical impressions above   Remaining deficits: As above   Education / Equipment: HEP Plan: Patient agrees to discharge.  Patient goals were partially met. Patient is being discharged due to meeting the stated rehab goals.  ?????        Patient will benefit from skilled therapeutic intervention in order to improve the following deficits and impairments:     Visit Diagnosis: Abnormal posture  Other idiopathic scoliosis, thoracolumbar region  Muscle weakness (generalized)  Muscle spasm of back     Problem List Patient Active Problem List   Diagnosis Date Noted  . Pulmonary hypertension, unspecified ([place]) 04/14/2019  . Ischemic colitis ([place]) 04/14/2019  . Migraine with aura and without status migrainosus, not intractable 11/08/2018  . Degeneration of lumbar intervertebral disc 10/14/2018  . Hoarseness of voice 03/04/2018  . Abdominal pain 07/01/2017  . Diverticulitis, colon   . Metabolic acidosis, increased anion gap   . Constipation 04/14/2017  . Lumbar hernia 04/14/2017  . Presbycusis of both ears 01/10/2017  . Tinnitus aurium, bilateral 01/10/2017  . Gastroesophageal reflux disease 08/20/2016  . Hemoptysis 08/20/2016  . Obstructive sleep apnea of adult 08/20/2016  . Rhinitis, chronic 08/20/2016  . Throat pain in adult 08/20/2016  . Fatigue  12/23/2015  . Sciatica of right side 10/06/2015  . History of migraine headaches 10/06/2015  . Frequent PVCs 12/28/2013  . Premature atrial contractions 12/28/2013  . PSVT (paroxysmal supraventricular tachycardia) ([place]) 12/28/2013  . Heart palpitations 07/13/2013  .  Sleep apnea 04/11/2013  . Scoliosis 04/11/2013  . Atrial flutter with rapid ventricular response ([place]) 11/29/2012  . Chest pain, atypical 11/29/2012  . Fibromyalgia syndrome 11/29/2012  . Chronic steroid use 11/29/2012   Ruben Im, PT 02/23/20 5:33 PM Phone: [phone] Fax: [phone] Alvera Singh 02/23/2020, 5:29 PM  [facility] [address], [place], [place], [address] Phone: [phone]   Fax:  [phone]  Name: Janet Nguyen MRN: [id number] Date of Birth: Jan 06, 1932

## 2020-03-07 ENCOUNTER — Other Ambulatory Visit: Payer: Self-pay

## 2020-03-07 ENCOUNTER — Ambulatory Visit: Payer: Medicare Other | Attending: Obstetrics & Gynecology | Admitting: Physical Therapy

## 2020-03-07 ENCOUNTER — Encounter: Payer: Self-pay | Admitting: Physical Therapy

## 2020-03-07 DIAGNOSIS — R293 Abnormal posture: Secondary | ICD-10-CM | POA: Diagnosis present

## 2020-03-07 DIAGNOSIS — M62830 Muscle spasm of back: Secondary | ICD-10-CM | POA: Diagnosis present

## 2020-03-07 DIAGNOSIS — R278 Other lack of coordination: Secondary | ICD-10-CM | POA: Diagnosis present

## 2020-03-07 DIAGNOSIS — M4125 Other idiopathic scoliosis, thoracolumbar region: Secondary | ICD-10-CM | POA: Diagnosis present

## 2020-03-07 DIAGNOSIS — M6281 Muscle weakness (generalized): Secondary | ICD-10-CM | POA: Insufficient documentation

## 2020-03-07 NOTE — Patient Instructions (Signed)
   1. Practice quick flicks 10 times each wakeful hour in various positions (sitting, standing, laying down) 2. Keep a log of fluids (time, type, amount) and urination patterns (time, urgency, leakage, # of [place]) 3. Practice The Knack (see below)   THE KNACK  The Knack is a strategy you may use to help to reduce or prevent leakage or passing of urine, gas or feces during an activity that causes downward force on the pelvic floor muscles.    Activities that can cause downward pressure on the pelvic floor muscles include coughing, sneezing, laughing, bending, lifting, and transitioning from different body positions such as from laying down to sitting up and sitting to standing.  To perform The Knack, consciously squeeze and lift your pelvic floor muscles to perform a strong, well-timed pelvic muscle contraction BEFORE AND DURING these activities above.  As your contraction gets more coordinated and your muscles get stronger, you will become more effective in controlling your experience of incontinence or gas passing during these activities.    Moisturizers . They are used in the vagina to hydrate the mucous membrane that make up the vaginal canal. . Designed to keep a more normal acid balance (ph) . Once placed in the vagina, it will last between two to three days.  . Use 2-3 times per week at bedtime  . Ingredients to avoid is glycerin and fragrance, can increase chance of infection . Should not be used just before sex due to causing irritation . Most are gels administered either in a tampon-shaped applicator or as a vaginal suppository. They are non-hormonal.   Types of Moisturizers  . Vitamin E vaginal suppositories- Whole foods, Amazon . Moist Again . Coconut oil- can break down condoms . Julva- (Do no use if on Tamoxifen) amazon . Yes moisturizer- amazon . NeuEve Silk , NeuEve Silver for menopausal or over 65 (if have severe vaginal atrophy or cancer treatments use NeuEve  Silk for  1 month than move to [employer])- [employer], MapleFlower.dk . Olive and Bee intimate cream- www.oliveandbee.com.au . Mae vaginal Follansbee . Aloe .    Creams to use externally on the Vulva area  Albertson's (good for for cancer patients that had radiation to the area)- [place] or [employer].FlyingBasics.com.br  V-magic cream - amazon  Julva-amazon  Vital "V Wild Yam salve ( help moisturize and help with thinning vulvar area, does have [place] by Irwin Brakeman labial moisturizer ([place],   Coconut or olive oil  aloe   Things to avoid in the vaginal area . Do not use things to irritate the vulvar area . No lotions just specialized creams for the vulva area- Neogyn, V-magic, No soaps; can use Aveeno or Calendula cleanser if needed. Must be gentle . No deodorants . No douches . Good to sleep without underwear to let the vaginal area to air out . No scrubbing: spread the lips to let warm water rinse over labias and pat dry

## 2020-03-07 NOTE — Therapy (Signed)
[facility] [address] [place], [place], [address] Phone: [phone]   Fax:  [phone]  Physical Therapy Evaluation  Patient Details  Name: Janet Nguyen MRN: [id number] Date of Birth: [birth year] Referring Provider (PT): Maisie Fus, MD   Encounter Date: 03/07/2020  PT End of Session - 03/07/20 1308    Visit Number  1    Date for PT Re-Evaluation  05/02/20    Authorization Type  Medicare    Progress Note Due on Visit  10    PT Start Time  1100    PT Stop Time  1145    PT Time Calculation (min)  45 min    Activity Tolerance  Patient tolerated treatment well    Behavior During Therapy  [facility] for tasks assessed/performed       Past Medical History:  Diagnosis Date  . Arrhythmia    History of SVT with documented PVC'S and  PAC'S  12/08/12 Nuc stress test normal LV EF 74%  Event Monitor  12/01/12-01/03/13  . Atrial flutter ([place])   . Celiac disease    treated by Dr. [name]  . GERD (gastroesophageal reflux disease)   . Intervertebral disc stenosis of neural canal of cervical region   . Irregular heart beat 11/30/12   ECHO-EF 60-65%  . Osteoporosis   . PMR (polymyalgia rheumatica) (HCC)    Dr. Marijean Bravo; pt states she was diagnosed 10-15 years ago, not treated at this time or any issues that she is aware of.  . Scoliosis   . Scoliosis   . Sleep apnea 10/02/11 [facility]   Sleep study AHI -total sleep 10.3/hr  64.0/ hr during REM sleep.RDI 22.8/hr during total sleep 64.0/hr during REM sleep The lowest O2 sat during Non-REM and REM sleep was 86% and 88% respectively. 04/08/12 CPAP/BIPAP titration study [facility]    Past Surgical History:  Procedure Laterality Date  . APPENDECTOMY     ruptured at age 18 and had surgery  . CARDIAC CATHETERIZATION  01/27/06  . cataract surgery  2015   Dr. Herbert Deaner; March & April 2015    There were no vitals filed for this  visit.   Subjective Assessment - 03/07/20 1059    Subjective  Pt notices new onset of leakage of both urine and feces.  Small amounts of urine with sneeze.  Small amounts of discomfort around the anus and maybe a hemorrohoid is there.  Have had a pebble of stool escape with passing gas.  I have severe scoliosis which creates a feeling of a tight vest around my trunk.    Pertinent History  Celiac disease, upset stomach with eating, Hx of constipation but manages for daily BM, Hx of hemorrhoids, maybe have one now    Diagnostic tests  none    Patient Stated Goals  reduce leakage, have more confidence to go out into community    Currently in Pain?  Yes    Pain Score  7     Pain Location  Abdomen    Pain Orientation  Anterior;Posterior;Lower;Medial;Left;Right   pain wraps around trunk   Pain Descriptors / Indicators  Aching;Tightness;Pressure    Pain Type  Chronic pain    Pain Onset  More than a month ago    Pain Frequency  Constant    Aggravating Factors   sneezing (leakage), eating (abdominal pain and pressure)         [facility] PT Assessment - 03/07/20  0001      Assessment   Medical Diagnosis  R32 (ICD-10-CM) - Unspecified urinary incontinence    Referring Provider (PT)  Maisie Fus, MD    Onset Date/Surgical Date  --   6-12 mos, slow onset   Prior Therapy  for scoliosis at this clinic      Precautions   Precautions  None    Precaution Comments  signif scoliosis      Restrictions   Weight Bearing Restrictions  No      Balance Screen   Has the patient fallen in the past 6 months  No      [place] residence    Living Arrangements  Alone      Prior Function   Level of Independence  Independent    Vocation  Retired    Leisure  games with friends      Cognition   Overall Cognitive Status  Within Functional Limits for tasks assessed      Observation/Other Assessments   Observations  evidence of urethrocele present, vaginal atrophy  present, vulvar dryness present , varicose vein present on Rt outside of vaginal introitus present, evidence of past hemorroids present around anus                  Objective measurements completed on examination: See above findings.    Pelvic Floor Special Questions - 03/07/20 0001    Prior Pelvic/Prostate Exam  --   1 year ago   Prior Pregnancies  Yes    Number of Pregnancies  4    Number of Vaginal Deliveries  4    Currently Sexually Active  No    History of sexually transmitted disease  No    Urinary Leakage  Yes    How often  2-3x/week    Pad use  community use of liners    Activities that cause leaking  Sneezing;With strong urge;Coughing;Laughing    Urinary urgency  Yes    Urinary frequency  unsure    Fecal incontinence  Yes    Fluid intake  needs to drink more    Caffeine beverages  tea, 2-3/day    Falling out feeling (prolapse)  Yes    Activities that cause feeling of prolapse  general heaviness    External Perineal Exam  PT explained exam and Pt gave verbal consent    Skin Integrity  Irritaion present at;Hemorroids    Skin Integrity Irritation Present at  Rt introitus, varicose vein present    External Palpation  Rt ischiocavernosus tender    Prolapse  Other    Prolapse other  suspect urethrocele    Pelvic Floor Internal Exam  PT explained exam and Pt gave verbal consent    Exam Type  Vaginal    Sensation  intact    Palpation  non tender    Strength  good squeeze, good lift, able to hold agaisnt strong resistance    Strength # of reps  3    Strength # of seconds  8   quick flicks 4 in 10 sec              PT Education - 03/07/20 1147    Education Details  The Knack, quick flicks each hour x 10 reps, vaginal moisturizers, log fluids and urination    Person(s) Educated  Patient    Methods  Explanation;Handout    Comprehension  Verbalized understanding       PT  Short Term Goals - 03/07/20 1319      PT SHORT TERM GOAL #1   Title  Pt will be  ind with vulvar skin care for healthy tissues    Time  3    Period  Weeks    Status  New    [place] Date  03/28/20      PT SHORT TERM GOAL #2   Title  Pt will be able to perform at least 7 quick flicks with 4/5 strength in 10 sec    Baseline  4    Time  4    Period  Weeks    Status  New    [place] Date  04/04/20      PT SHORT TERM GOAL #3   Title  Pt will report reduced incidence of urine leakage by at least 20%    Time  4    Period  Weeks    Status  New    Target Date  04/04/20        PT Long Term Goals - 03/07/20 1321      PT LONG TERM GOAL #1   Title  Pt will be ind with advanced HEP    Time  8    Period  Weeks    Status  New    [place] Date  05/02/20      PT LONG TERM GOAL #2   Title  Pt will report reduced leakage by at least 70% with cough/sneeze/laugh and passing of gas.    Time  8    Period  Weeks    Status  New    [place] Date  05/02/20      PT LONG TERM GOAL #3   Title  Pt will report improved abdominal pain by at least 50% (not associated with eating).    Time  8    Period  Weeks    Status  New    Target Date  05/02/20             Plan - 03/07/20 1309    Clinical Impression Statement  Pt referred for PT with history of recent onset x 6-12 mos of both urinary and fecal incontinence.  Urine leakage with cough/sneeze in small amounts, 2-3x/week.  Small pebble of stool may escpae with passing gas.  Pt with signif scoliosis which limits abdominal canister space with feeling of wearing a "tight vest" around trunk, pain of 7/10.  History of constipation which is now well managed with meds/routine.  PT performed pelvic floor assessment with consent.  Pt is able to contract/relax/bulge.  Strength is 4/5 and PERFECT Score is 4/3/8//4.  PT observed evidence of possible urethrocele.  Pt has tenderness along Rt vaginal introitus with possible varicose vein observed there.  PT educated Pt on The Knack, quick flicks 29H each hour in varied positions, encouraged a fluid  intake/bladder log, and educated Pt on vaginal moisturizers/vulvar skin care.  PT will perform trunk/abdominal wall assessment next visit given scoliosis and compressed trunk due to spinal curve.  Pt will benefit from skilled PT to address symptoms and reduce leakage.    Personal Factors and Comorbidities  Age;Comorbidity 1;Comorbidity 2;Time since onset of injury/illness/exacerbation    Comorbidities  scoliosis, sleep apnea, osteoporosis, polymyalgia rheumatica    Examination-Activity Limitations  Continence;Stand;Locomotion Level    Examination-Participation Restrictions  Community Activity;Shop    Stability/Clinical Decision Making  Evolving/Moderate complexity    Clinical Decision Making  Moderate    Rehab Potential  Good  PT Frequency  1x / week    PT Duration  8 weeks    PT Treatment/Interventions  ADLs/Self Care Home Management;Biofeedback;Electrical Stimulation;Moist Heat;Neuromuscular re-education;Therapeutic exercise;Therapeutic activities;Patient/family education;Manual techniques;Passive range of motion;Joint Manipulations;Spinal Manipulations    PT Next Visit Plan  assess trunk mobility and abdominal wall, f/u on HEP, manual techniques to abdominal wall    PT Home Exercise Plan  The knack, fluid intake/bladder log, quick flicks 64W/OEHO, vulvar skin care/moisturizers    Consulted and Agree with Plan of Care  Patient       Patient will benefit from skilled therapeutic intervention in order to improve the following deficits and impairments:  Decreased strength, Postural dysfunction, Improper body mechanics, Impaired flexibility, Hypomobility, Decreased coordination, Pain  Visit Diagnosis: Other lack of coordination - Plan: PT plan of care cert/re-cert  Abnormal posture - Plan: PT plan of care cert/re-cert  Muscle weakness (generalized) - Plan: PT plan of care cert/re-cert     Problem List Patient Active Problem List   Diagnosis Date Noted  . Pulmonary hypertension,  unspecified ([place]) 04/14/2019  . Ischemic colitis ([place]) 04/14/2019  . Migraine with aura and without status migrainosus, not intractable 11/08/2018  . Degeneration of lumbar intervertebral disc 10/14/2018  . Hoarseness of voice 03/04/2018  . Abdominal pain 07/01/2017  . Diverticulitis, colon   . Metabolic acidosis, increased anion gap   . Constipation 04/14/2017  . Lumbar hernia 04/14/2017  . Presbycusis of both ears 01/10/2017  . Tinnitus aurium, bilateral 01/10/2017  . Gastroesophageal reflux disease 08/20/2016  . Hemoptysis 08/20/2016  . Obstructive sleep apnea of adult 08/20/2016  . Rhinitis, chronic 08/20/2016  . Throat pain in adult 08/20/2016  . Fatigue 12/23/2015  . Sciatica of right side 10/06/2015  . History of migraine headaches 10/06/2015  . Frequent PVCs 12/28/2013  . Premature atrial contractions 12/28/2013  . PSVT (paroxysmal supraventricular tachycardia) ([place]) 12/28/2013  . Heart palpitations 07/13/2013  . Sleep apnea 04/11/2013  . Scoliosis 04/11/2013  . Atrial flutter with rapid ventricular response ([place]) 11/29/2012  . Chest pain, atypical 11/29/2012  . Fibromyalgia syndrome 11/29/2012  . Chronic steroid use 11/29/2012    Baruch Merl, PT 03/07/20 1:26 PM   [facility] [address], [place], [place], [address] Phone: [phone]   Fax:  [phone]  Name: Janet Nguyen MRN: [id number] Date of Birth: 1932-04-16

## 2020-03-14 ENCOUNTER — Encounter: Payer: Self-pay | Admitting: Physical Therapy

## 2020-03-14 ENCOUNTER — Other Ambulatory Visit: Payer: Self-pay

## 2020-03-14 ENCOUNTER — Ambulatory Visit: Payer: Medicare Other | Admitting: Physical Therapy

## 2020-03-14 DIAGNOSIS — M4125 Other idiopathic scoliosis, thoracolumbar region: Secondary | ICD-10-CM

## 2020-03-14 DIAGNOSIS — R278 Other lack of coordination: Secondary | ICD-10-CM | POA: Diagnosis not present

## 2020-03-14 DIAGNOSIS — R293 Abnormal posture: Secondary | ICD-10-CM

## 2020-03-14 DIAGNOSIS — M6281 Muscle weakness (generalized): Secondary | ICD-10-CM

## 2020-03-14 NOTE — Patient Instructions (Addendum)
  About Abdominal Massage  Abdominal massage, also called external colon massage, is a self-treatment circular massage technique that can reduce and eliminate gas and ease constipation. The colon naturally contracts in waves in a clockwise direction starting from inside the right hip, moving up toward the ribs, across the belly, and down inside the left hip.  When you perform circular abdominal massage, you help stimulate your colon's normal wave pattern of movement called peristalsis.  It is most beneficial when done after eating.  Positioning You can practice abdominal massage with oil while lying down, or in the shower with soap.  Some people find that it is just as effective to do the massage through clothing while sitting or standing.  How to Massage Start by placing your finger tips or knuckles on your right side, just inside your hip bone.  . Make small circular movements while you move upward toward your rib cage.   . Once you reach the bottom right side of your rib cage, take your circular movements across to the left side of the bottom of your rib cage.  . Next, move downward until you reach the inside of your left hip bone.  This is the path your feces travel in your colon. . Continue to perform your abdominal massage in this pattern for 10 minutes each day.     You can apply as much pressure as is comfortable in your massage.  Start gently and build pressure as you continue to practice.  Notice any areas of pain as you massage; areas of slight pain may be relieved as you massage, but if you have areas of significant or intense pain, consult with your healthcare provider.  Other Considerations . General physical activity including bending and stretching can have a beneficial massage-like effect on the colon.  Deep breathing can also stimulate the colon because breathing deeply activates the same nervous system that supplies the colon.   . Abdominal massage should always be used in  combination with a bowel-conscious diet that is high in the proper type of fiber for you, fluids (primarily water), and a regular exercise program.   Access Code: [email or web address] URL: [email or web address]: 06/16/2021Prepared by: Venetia Night BeuhringExercises  Supine Diaphragmatic Breathing - 1 x daily - 7 x weekly - 3-5 min hold

## 2020-03-14 NOTE — Therapy (Signed)
[facility] [address] [place], [place], [address] Phone: [phone]   Fax:  [phone]  Physical Therapy Treatment  Patient Details  Name: Janet Nguyen MRN: [id number] Date of Birth: 03/24/1932 Referring Provider (PT): Maisie Fus, MD   Encounter Date: 03/14/2020   PT End of Session - 03/14/20 1441    Visit Number 2    Date for PT Re-Evaluation 05/02/20    Authorization Type Medicare    PT Start Time 1100    PT Stop Time 1140    PT Time Calculation (min) 40 min    Activity Tolerance Patient tolerated treatment well    Behavior During Therapy [facility] for tasks assessed/performed           Past Medical History:  Diagnosis Date  . Arrhythmia    History of SVT with documented PVC'S and  PAC'S  12/08/12 Nuc stress test normal LV EF 74%  Event Monitor  12/01/12-01/03/13  . Atrial flutter ([place])   . Celiac disease    treated by Dr. [name]  . GERD (gastroesophageal reflux disease)   . Intervertebral disc stenosis of neural canal of cervical region   . Irregular heart beat 11/30/12   ECHO-EF 60-65%  . Osteoporosis   . PMR (polymyalgia rheumatica) (HCC)    Dr. Marijean Bravo; pt states she was diagnosed 10-15 years ago, not treated at this time or any issues that she is aware of.  . Scoliosis   . Scoliosis   . Sleep apnea 10/02/11 [facility]   Sleep study AHI -total sleep 10.3/hr  64.0/ hr during REM sleep.RDI 22.8/hr during total sleep 64.0/hr during REM sleep The lowest O2 sat during Non-REM and REM sleep was 86% and 88% respectively. 04/08/12 CPAP/BIPAP titration study [facility]    Past Surgical History:  Procedure Laterality Date  . APPENDECTOMY     ruptured at age 85 and had surgery  . CARDIAC CATHETERIZATION  01/27/06  . cataract surgery  2015   Dr. Herbert Deaner; March & April 2015    There were no vitals filed for this visit.   Subjective Assessment - 03/14/20 1103     Subjective Pt arrives with fluid, BM and urination log.  Counting [place] 10-20 counts with each void, urinating 6-7x/day.  She is drinking 8-9 cups of fluids a day with tea and water.  No episodes of fecal incontinence since last visit and only one "drop" of urine with a cough since last visit.    Pertinent History Celiac disease, upset stomach with eating, Hx of constipation but manages for daily BM, Hx of hemorrhoids, maybe have one now    Diagnostic tests none    Patient Stated Goals reduce leakage, have more confidence to go out into community    Currently in Pain? Yes    Pain Score 7     Pain Location Abdomen    Pain Descriptors / Indicators Aching;Pressure    Pain Type Chronic pain    Pain Onset More than a month ago    Pain Frequency Constant    Aggravating Factors  eating, sneezing                             [facility] Adult PT Treatment/Exercise - 03/14/20 0001      Self-Care   Self-Care Other Self-Care Comments    Other Self-Care Comments  review of bladder and fluid log, benefits  of diaphragmatic breathing and how diaphragm and PF work as piston for pressure management of abdominal canister      Neuro Re-ed    Neuro Re-ed Details  puffer training supine into fist x 15 reps, diaphragmatic breathing with TCs by PT on lateral ribcage and abdomen for facil of breath direction/awareness      Manual Therapy   Manual Therapy Soft tissue mobilization    Soft tissue mobilization abdominal massage supine with head and LEs propped, blankets/pillows to support spine                  PT Education - 03/14/20 1145    Education Details Access Code: [email or web address]: [email or web address]: 06/16/2021Prepared by: Venetia Night BeuhringExercises.Supine Diaphragmatic Breathing - 1 x daily - 7 x weekly - 3-5 min hold, abdominal massage info    Person(s) Educated Patient    Methods Explanation;Demonstration;Handout    Comprehension Verbalized  understanding;Returned demonstration            PT Short Term Goals - 03/07/20 1319      PT SHORT TERM GOAL #1   Title Pt will be ind with vulvar skin care for healthy tissues    Time 3    Period Weeks    Status New    Target Date 03/28/20      PT SHORT TERM GOAL #2   Title Pt will be able to perform at least 7 quick flicks with 4/5 strength in 10 sec    Baseline 4    Time 4    Period Weeks    Status New    Target Date 04/04/20      PT SHORT TERM GOAL #3   Title Pt will report reduced incidence of urine leakage by at least 20%    Time 4    Period Weeks    Status New    Target Date 04/04/20             PT Long Term Goals - 03/07/20 1321      PT LONG TERM GOAL #1   Title Pt will be ind with advanced HEP    Time 8    Period Weeks    Status New    Target Date 05/02/20      PT LONG TERM GOAL #2   Title Pt will report reduced leakage by at least 70% with cough/sneeze/laugh and passing of gas.    Time 8    Period Weeks    Status New    Target Date 05/02/20      PT LONG TERM GOAL #3   Title Pt will report improved abdominal pain by at least 50% (not associated with eating).    Time 8    Period Weeks    Status New    Target Date 05/02/20                 Plan - 03/14/20 1443    Clinical Impression Statement Pt is doing well with fluids, bowel schedule, and urination frequency per her log.  She is working on quick flicks for PF and has had reduced leakage of both bowel and bladder since first visit with only one small episode of urine leakage with a cough since evaluation.  PT worked on abdominal massage and neuro re-ed in supine to address management of abdominal canister pressures with speech, laugh, cough.  Pt with ability to reduce lower abdominal bulge with puffer training into fist and use of Kegel before cough.  Pt was  able to initiate diaphragmatic breathing today with cues of barrel/beach ball filling, TCs by PT on lateral rib cage for expansion, and  VC to "fill air downward first like pouring orange juice into a pitcher."  Pt will continue to benefit from skilled PT along POC.    Comorbidities scoliosis, sleep apnea, osteoporosis, polymyalgia rheumatica    Rehab Potential Good    PT Frequency 1x / week    PT Duration 8 weeks    PT Treatment/Interventions ADLs/Self Care Home Management;Biofeedback;Electrical Stimulation;Moist Heat;Neuromuscular re-education;Therapeutic exercise;Therapeutic activities;Patient/family education;Manual techniques;Passive range of motion;Joint Manipulations;Spinal Manipulations    PT Next Visit Plan review STGs, assess abdominal wall tension in sitting/standing to see if can be reduced with core cueing, puffer training, f/u on abd massage, continue diaphragmatic breathing, sit to stand with PF and breath work    PT Home Exercise Plan The knack, fluid intake/bladder log, quick flicks 03U/DTHY, vulvar skin care/moisturizers, abd massage, diaphragmatic breathing    Consulted and Agree with Plan of Care Patient           Patient will benefit from skilled therapeutic intervention in order to improve the following deficits and impairments:     Visit Diagnosis: Other lack of coordination  Abnormal posture  Muscle weakness (generalized)  Other idiopathic scoliosis, thoracolumbar region     Problem List Patient Active Problem List   Diagnosis Date Noted  . Pulmonary hypertension, unspecified ([place]) 04/14/2019  . Ischemic colitis ([place]) 04/14/2019  . Migraine with aura and without status migrainosus, not intractable 11/08/2018  . Degeneration of lumbar intervertebral disc 10/14/2018  . Hoarseness of voice 03/04/2018  . Abdominal pain 07/01/2017  . Diverticulitis, colon   . Metabolic acidosis, increased anion gap   . Constipation 04/14/2017  . Lumbar hernia 04/14/2017  . Presbycusis of both ears 01/10/2017  . Tinnitus aurium, bilateral 01/10/2017  . Gastroesophageal reflux disease 08/20/2016  .  Hemoptysis 08/20/2016  . Obstructive sleep apnea of adult 08/20/2016  . Rhinitis, chronic 08/20/2016  . Throat pain in adult 08/20/2016  . Fatigue 12/23/2015  . Sciatica of right side 10/06/2015  . History of migraine headaches 10/06/2015  . Frequent PVCs 12/28/2013  . Premature atrial contractions 12/28/2013  . PSVT (paroxysmal supraventricular tachycardia) ([place]) 12/28/2013  . Heart palpitations 07/13/2013  . Sleep apnea 04/11/2013  . Scoliosis 04/11/2013  . Atrial flutter with rapid ventricular response ([place]) 11/29/2012  . Chest pain, atypical 11/29/2012  . Fibromyalgia syndrome 11/29/2012  . Chronic steroid use 11/29/2012   Baruch Merl, PT 03/14/20 2:55 PM   [facility] [address], [place], [place], [address] Phone: [phone]   Fax:  [phone]  Name: Janet Nguyen MRN: [id number] Date of Birth: 1932-05-11

## 2020-03-19 ENCOUNTER — Telehealth: Payer: Self-pay | Admitting: Cardiovascular Disease

## 2020-03-19 NOTE — Telephone Encounter (Signed)
Amlodipine should not be causing nosebleeds or easy bruisability.  The amlodipine should not be causing her chest pain.

## 2020-03-19 NOTE — Telephone Encounter (Signed)
Spoke with pt who report on Saturday she had a nosebleed that caused her to go into a panic attack. Pt report a few years ago she experienced multiple nosebleeds that lead her in the hospital so it makes her very nervous. Pt report she read that long time use of amlodipine can cause bruising/nosebleeds and she has notice bruising on her ear as well. Pt also report on and of chest heaviness but denies pain at the moment. Pt report she feels the chest pain if from stress or muscular.  Will route to MD for recommendations.

## 2020-03-19 NOTE — Telephone Encounter (Signed)
Pt c/o medication issue:  1. Name of Medication: amLODipine (NORVASC) 5 MG tablet  2. How are you currently taking this medication (dosage and times per day)? As written  3. Are you having a reaction (difficulty breathing--STAT)? Yes  4. What is your medication issue? Medication causes severe nosebleeds, and bleeding in general and chest pains.

## 2020-03-20 NOTE — Telephone Encounter (Signed)
Patient returning call.

## 2020-03-20 NOTE — Telephone Encounter (Signed)
Patient has not heard anything in regards to her call from yesterday. Please call her with Dr. Evette Georges Recommendations

## 2020-03-20 NOTE — Telephone Encounter (Signed)
Left message to call back  

## 2020-03-20 NOTE — Telephone Encounter (Signed)
Pt updated and verbalized understanding.

## 2020-03-21 ENCOUNTER — Ambulatory Visit: Payer: Medicare Other | Admitting: Physical Therapy

## 2020-03-21 ENCOUNTER — Other Ambulatory Visit: Payer: Self-pay

## 2020-03-21 ENCOUNTER — Encounter: Payer: Self-pay | Admitting: Physical Therapy

## 2020-03-21 DIAGNOSIS — M6281 Muscle weakness (generalized): Secondary | ICD-10-CM

## 2020-03-21 DIAGNOSIS — M4125 Other idiopathic scoliosis, thoracolumbar region: Secondary | ICD-10-CM

## 2020-03-21 DIAGNOSIS — R293 Abnormal posture: Secondary | ICD-10-CM

## 2020-03-21 DIAGNOSIS — M62830 Muscle spasm of back: Secondary | ICD-10-CM

## 2020-03-21 DIAGNOSIS — R278 Other lack of coordination: Secondary | ICD-10-CM | POA: Diagnosis not present

## 2020-03-21 NOTE — Therapy (Signed)
[facility] [address], [place], [place], [address] Phone: [phone]   Fax:  [phone]  Physical Therapy Treatment  Patient Details  Name: Janet Nguyen MRN: [id number] Date of Birth: 1932-01-20 Referring Provider (PT): Maisie Fus, MD   Encounter Date: 03/21/2020   PT End of Session - 03/21/20 1150    Visit Number 3    Date for PT Re-Evaluation 05/02/20    Authorization Type Medicare    Progress Note Due on Visit 10    PT Start Time 1100    PT Stop Time 1145    PT Time Calculation (min) 45 min    Activity Tolerance Patient tolerated treatment well    Behavior During Therapy [facility] for tasks assessed/performed           Past Medical History:  Diagnosis Date  . Arrhythmia    History of SVT with documented PVC'S and  PAC'S  12/08/12 Nuc stress test normal LV EF 74%  Event Monitor  12/01/12-01/03/13  . Atrial flutter ([place])   . Celiac disease    treated by Dr. [name]  . GERD (gastroesophageal reflux disease)   . Intervertebral disc stenosis of neural canal of cervical region   . Irregular heart beat 11/30/12   ECHO-EF 60-65%  . Osteoporosis   . PMR (polymyalgia rheumatica) (HCC)    Dr. Marijean Bravo; pt states she was diagnosed 10-15 years ago, not treated at this time or any issues that she is aware of.  . Scoliosis   . Scoliosis   . Sleep apnea 10/02/11 [facility]   Sleep study AHI -total sleep 10.3/hr  64.0/ hr during REM sleep.RDI 22.8/hr during total sleep 64.0/hr during REM sleep The lowest O2 sat during Non-REM and REM sleep was 86% and 88% respectively. 04/08/12 CPAP/BIPAP titration study [facility]    Past Surgical History:  Procedure Laterality Date  . APPENDECTOMY     ruptured at age 52 and had surgery  . CARDIAC CATHETERIZATION  01/27/06  . cataract surgery  2015   Dr. Herbert Deaner; March & April 2015    There were no vitals filed for this visit.    Subjective Assessment - 03/21/20 1109    Subjective I want to show you the Vit E suppositories I ordered.  I don't like how they are packaged and don't feel like I can use them easily.   I continue to be regular with my BMs and my fluid intake.    Pertinent History Celiac disease, upset stomach with eating, Hx of constipation but manages for daily BM, Hx of hemorrhoids, maybe have one now    Diagnostic tests none    Patient Stated Goals reduce leakage, have more confidence to go out into community    Currently in Pain? Yes    Pain Score 5     Pain Location Back    Pain Orientation Lower;Posterior;Anterior    Pain Descriptors / Indicators Aching;Pressure    Pain Onset More than a month ago                             [facility] Adult PT Treatment/Exercise - 03/21/20 0001      Self-Care   Other Self-Care Comments  vaginal moisturizer review, toileting posture, voiding time norms      Neuro Re-ed    Neuro Re-ed Details  seated low grade vaginal pick up "blueberry" or elevator  cue with self-palpation of TrA in-drawing, 5 reps, VCs and TCs by PT      Manual Therapy   Manual Therapy Myofascial release    Manual therapy comments seated    Myofascial Release lats Lt>Rt, lumbar and thoracic paraspinals, lats proximally posterior shoulder, obliques                    PT Short Term Goals - 03/07/20 1319      PT SHORT TERM GOAL #1   Title Pt will be ind with vulvar skin care for healthy tissues    Time 3    Period Weeks    Status New    Target Date 03/28/20      PT SHORT TERM GOAL #2   Title Pt will be able to perform at least 7 quick flicks with 4/5 strength in 10 sec    Baseline 4    Time 4    Period Weeks    Status New    Target Date 04/04/20      PT SHORT TERM GOAL #3   Title Pt will report reduced incidence of urine leakage by at least 20%    Time 4    Period Weeks    Status New    Target Date 04/04/20             PT Long Term Goals - 03/07/20  1321      PT LONG TERM GOAL #1   Title Pt will be ind with advanced HEP    Time 8    Period Weeks    Status New    Target Date 05/02/20      PT LONG TERM GOAL #2   Title Pt will report reduced leakage by at least 70% with cough/sneeze/laugh and passing of gas.    Time 8    Period Weeks    Status New    Target Date 05/02/20      PT LONG TERM GOAL #3   Title Pt will report improved abdominal pain by at least 50% (not associated with eating).    Time 8    Period Weeks    Status New    Target Date 05/02/20                 Plan - 03/21/20 1301    Clinical Impression Statement PT discussed options for vaginal moisturizers with Pt today.  PT discussed how organs/intesines will follow that path of least resistance/weakness in gravity, which for her is anteriorly into lower anterior abdomen, creating pressure and discomfort.  Neuro re-ed focused on gentle PF contractions with self-palpation of lower abdominal in-drawing of TrA to provide dynamic suspension for abdominal contents today.  PT encouraged Pt to add this in (endurance contractions on low grade holds) in gravity positions sitting and standing to see if this reduces abdominal pain/pressure.  Pt with severe scoliosis so PT performed myofascial release to address trunk tension that creates further downward pressure into pelvic bowl.  Pt noted one episode of bowel leakage in small quantity since last visit.  Continue along POC.    Comorbidities scoliosis, sleep apnea, osteoporosis, polymyalgia rheumatica    PT Frequency 1x / week    PT Duration 8 weeks    PT Treatment/Interventions ADLs/Self Care Home Management;Biofeedback;Electrical Stimulation;Moist Heat;Neuromuscular re-education;Therapeutic exercise;Therapeutic activities;Patient/family education;Manual techniques;Passive range of motion;Joint Manipulations;Spinal Manipulations    PT Next Visit Plan f/u on GCL sample, f/u on TrA in sitting by way of PF blueberry/elevator  cue,  review STGs, continue diaphragmatic breathing, puffer training, STM trunk    PT Home Exercise Plan The knack, fluid intake/bladder log, quick flicks 17B/LTJQ, vulvar skin care/moisturizers, abd massage, diaphragmatic breathing    Consulted and Agree with Plan of Care Patient           Patient will benefit from skilled therapeutic intervention in order to improve the following deficits and impairments:     Visit Diagnosis: Other lack of coordination  Abnormal posture  Muscle weakness (generalized)  Other idiopathic scoliosis, thoracolumbar region  Muscle spasm of back     Problem List Patient Active Problem List   Diagnosis Date Noted  . Pulmonary hypertension, unspecified ([place]) 04/14/2019  . Ischemic colitis ([place]) 04/14/2019  . Migraine with aura and without status migrainosus, not intractable 11/08/2018  . Degeneration of lumbar intervertebral disc 10/14/2018  . Hoarseness of voice 03/04/2018  . Abdominal pain 07/01/2017  . Diverticulitis, colon   . Metabolic acidosis, increased anion gap   . Constipation 04/14/2017  . Lumbar hernia 04/14/2017  . Presbycusis of both ears 01/10/2017  . Tinnitus aurium, bilateral 01/10/2017  . Gastroesophageal reflux disease 08/20/2016  . Hemoptysis 08/20/2016  . Obstructive sleep apnea of adult 08/20/2016  . Rhinitis, chronic 08/20/2016  . Throat pain in adult 08/20/2016  . Fatigue 12/23/2015  . Sciatica of right side 10/06/2015  . History of migraine headaches 10/06/2015  . Frequent PVCs 12/28/2013  . Premature atrial contractions 12/28/2013  . PSVT (paroxysmal supraventricular tachycardia) ([place]) 12/28/2013  . Heart palpitations 07/13/2013  . Sleep apnea 04/11/2013  . Scoliosis 04/11/2013  . Atrial flutter with rapid ventricular response ([place]) 11/29/2012  . Chest pain, atypical 11/29/2012  . Fibromyalgia syndrome 11/29/2012  . Chronic steroid use 11/29/2012    Baruch Merl, PT 03/21/20 1:07 PM   [facility] [address], [place], [place], [address] Phone: [phone]   Fax:  [phone]  Name: Janet Nguyen MRN: [id number] Date of Birth: 05-03-1932

## 2020-03-28 ENCOUNTER — Other Ambulatory Visit: Payer: Self-pay

## 2020-03-28 ENCOUNTER — Ambulatory Visit: Payer: Medicare Other | Admitting: Physical Therapy

## 2020-03-28 ENCOUNTER — Encounter: Payer: Self-pay | Admitting: Physical Therapy

## 2020-03-28 DIAGNOSIS — R293 Abnormal posture: Secondary | ICD-10-CM

## 2020-03-28 DIAGNOSIS — R278 Other lack of coordination: Secondary | ICD-10-CM | POA: Diagnosis not present

## 2020-03-28 DIAGNOSIS — M4125 Other idiopathic scoliosis, thoracolumbar region: Secondary | ICD-10-CM

## 2020-03-28 DIAGNOSIS — M62830 Muscle spasm of back: Secondary | ICD-10-CM

## 2020-03-28 DIAGNOSIS — M6281 Muscle weakness (generalized): Secondary | ICD-10-CM

## 2020-03-28 NOTE — Therapy (Signed)
[facility] [address] [place], [place], [address] Phone: [phone]   Fax:  [phone]  Physical Therapy Treatment  Patient Details  Name: Janet Nguyen MRN: [id number] Date of Birth: Jun 07, 1932 Referring Provider (PT): Maisie Fus, MD   Encounter Date: 03/28/2020   PT End of Session - 03/28/20 1312    Visit Number 4    Date for PT Re-Evaluation 05/02/20    Authorization Type Medicare    Progress Note Due on Visit 10    PT Start Time 1100    PT Stop Time 1143    PT Time Calculation (min) 43 min    Activity Tolerance Patient tolerated treatment well    Behavior During Therapy [facility] for tasks assessed/performed           Past Medical History:  Diagnosis Date  . Arrhythmia    History of SVT with documented PVC'S and  PAC'S  12/08/12 Nuc stress test normal LV EF 74%  Event Monitor  12/01/12-01/03/13  . Atrial flutter ([place])   . Celiac disease    treated by Dr. [name]  . GERD (gastroesophageal reflux disease)   . Intervertebral disc stenosis of neural canal of cervical region   . Irregular heart beat 11/30/12   ECHO-EF 60-65%  . Osteoporosis   . PMR (polymyalgia rheumatica) (HCC)    Dr. Marijean Bravo; pt states she was diagnosed 10-15 years ago, not treated at this time or any issues that she is aware of.  . Scoliosis   . Scoliosis   . Sleep apnea 10/02/11 [facility]   Sleep study AHI -total sleep 10.3/hr  64.0/ hr during REM sleep.RDI 22.8/hr during total sleep 64.0/hr during REM sleep The lowest O2 sat during Non-REM and REM sleep was 86% and 88% respectively. 04/08/12 CPAP/BIPAP titration study [facility]    Past Surgical History:  Procedure Laterality Date  . APPENDECTOMY     ruptured at age 35 and had surgery  . CARDIAC CATHETERIZATION  01/27/06  . cataract surgery  2015   Dr. Herbert Deaner; March & April 2015    There were no vitals filed for this visit.    Subjective Assessment - 03/28/20 1103    Subjective My daughter is visiting. I have been distracted and haven't tried the samples. I sometimes have a hard pebble of a stool escape while I'm urinating.  Less leakage of urine - no episodes since last visit.    Pertinent History Celiac disease, upset stomach with eating, Hx of constipation but manages for daily BM, Hx of hemorrhoids, maybe have one now    Diagnostic tests none    Patient Stated Goals reduce leakage, have more confidence to go out into community    Currently in Pain? Yes    Pain Location Neck    Pain Orientation Left;Right    Pain Descriptors / Indicators Aching;Tightness    Pain Type Chronic pain    Pain Onset More than a month ago    Pain Frequency Constant    Aggravating Factors  sneezing, eating (abdominal pain)    Pain Relieving Factors therapy, tylenol, manual therapy                             [facility] Adult PT Treatment/Exercise - 03/28/20 0001      Self-Care   Self-Care Other Self-Care Comments    Other Self-Care Comments  external and internal  splinting for posterior vaginal wall support with BMs when stool is harder, perform PF contractions to finish a BM when stool is softer to restore tone after BM, PT gave Julva sample to try      Manual Therapy   Manual Therapy Myofascial release;Soft tissue mobilization    Soft tissue mobilization bil upper traps, levator, intrascapular region, thoracic paraspinals, cervical paraspinals    Myofascial Release elongation bil upper quadrants                    PT Short Term Goals - 03/28/20 1326      PT SHORT TERM GOAL #1   Title Pt will be ind with vulvar skin care for healthy tissues    Baseline trying samples    Status On-going      PT SHORT TERM GOAL #2   Title Pt will be able to perform at least 7 quick flicks with 4/5 strength in 10 sec    Status On-going      PT SHORT TERM GOAL #3   Title Pt will report reduced incidence of urine  leakage by at least 20%    Status Achieved             PT Long Term Goals - 03/28/20 1314      PT LONG TERM GOAL #1   Title Pt will be ind with advanced HEP    Status On-going      PT LONG TERM GOAL #2   Title Pt will report reduced leakage by at least 70% with cough/sneeze/laugh and passing of gas.    Baseline no episodes of urinary leakage since last visit, occassional pebble of stool escapes she didn't know was there    Status On-going      PT LONG TERM GOAL #3   Title Pt will report improved abdominal pain by at least 50% (not associated with eating).    Baseline working on trunk elongation for more pliability and room in abdominal canister    Status On-going      PT LONG TERM GOAL #4   Title -                 Plan - 03/28/20 1317    Clinical Impression Statement Pt reported no incidence of urinary leakage since last visit.  PT spent signif amount of time discussing toileting strategies to address symptoms.  Pt fluctuates from having hard pebbles of stool to soft messy stools.  PT discussed external and internal splinting options for more complete evacuation when hard stools present to avoid fecal incontinence of pebbles after having a BM.  PT also reinforced contracting PF following a BM to reduce wiping needs and restore tone of muscles of pelvic floor.  PT performed manual techniques to elongate soft tissues along spine for imrpoved compliance and elongation to see if this reduces abdominal pain with eating.  Pt will continue to benefit from skilled PT along POC.  Pt to try samples for moisturizing vulvar skin before next visit.    Comorbidities scoliosis, sleep apnea, osteoporosis, polymyalgia rheumatica    Rehab Potential Good    PT Frequency 1x / week    PT Duration 8 weeks    PT Treatment/Interventions ADLs/Self Care Home Management;Biofeedback;Electrical Stimulation;Moist Heat;Neuromuscular re-education;Therapeutic exercise;Therapeutic activities;Patient/family  education;Manual techniques;Passive range of motion;Joint Manipulations;Spinal Manipulations    PT Next Visit Plan f/u on GCL sample and Julva sample, f/u on splinting and PF contrations after BM, trunk elongation/STM    PT  Home Exercise Plan The knack, fluid intake/bladder log, quick flicks 16X/IHWT, vulvar skin care/moisturizers, abd massage, diaphragmatic breathing    Consulted and Agree with Plan of Care Patient           Patient will benefit from skilled therapeutic intervention in order to improve the following deficits and impairments:     Visit Diagnosis: Other lack of coordination  Abnormal posture  Muscle spasm of back  Muscle weakness (generalized)  Other idiopathic scoliosis, thoracolumbar region     Problem List Patient Active Problem List   Diagnosis Date Noted  . Pulmonary hypertension, unspecified ([place]) 04/14/2019  . Ischemic colitis ([place]) 04/14/2019  . Migraine with aura and without status migrainosus, not intractable 11/08/2018  . Degeneration of lumbar intervertebral disc 10/14/2018  . Hoarseness of voice 03/04/2018  . Abdominal pain 07/01/2017  . Diverticulitis, colon   . Metabolic acidosis, increased anion gap   . Constipation 04/14/2017  . Lumbar hernia 04/14/2017  . Presbycusis of both ears 01/10/2017  . Tinnitus aurium, bilateral 01/10/2017  . Gastroesophageal reflux disease 08/20/2016  . Hemoptysis 08/20/2016  . Obstructive sleep apnea of adult 08/20/2016  . Rhinitis, chronic 08/20/2016  . Throat pain in adult 08/20/2016  . Fatigue 12/23/2015  . Sciatica of right side 10/06/2015  . History of migraine headaches 10/06/2015  . Frequent PVCs 12/28/2013  . Premature atrial contractions 12/28/2013  . PSVT (paroxysmal supraventricular tachycardia) ([place]) 12/28/2013  . Heart palpitations 07/13/2013  . Sleep apnea 04/11/2013  . Scoliosis 04/11/2013  . Atrial flutter with rapid ventricular response ([place]) 11/29/2012  . Chest pain, atypical  11/29/2012  . Fibromyalgia syndrome 11/29/2012  . Chronic steroid use 11/29/2012    Baruch Merl, PT 03/28/20 1:27 PM   [facility] [address], [place], [place], [address] Phone: [phone]   Fax:  [phone]  Name: Janet Nguyen MRN: [id number] Date of Birth: 1932-08-10

## 2020-03-29 DIAGNOSIS — R5383 Other fatigue: Secondary | ICD-10-CM | POA: Diagnosis not present

## 2020-03-29 DIAGNOSIS — I868 Varicose veins of other specified sites: Secondary | ICD-10-CM | POA: Diagnosis not present

## 2020-03-29 DIAGNOSIS — G4710 Hypersomnia, unspecified: Secondary | ICD-10-CM | POA: Diagnosis not present

## 2020-03-29 DIAGNOSIS — G4733 Obstructive sleep apnea (adult) (pediatric): Secondary | ICD-10-CM | POA: Diagnosis not present

## 2020-03-29 DIAGNOSIS — R040 Epistaxis: Secondary | ICD-10-CM | POA: Diagnosis not present

## 2020-04-04 ENCOUNTER — Other Ambulatory Visit: Payer: Self-pay

## 2020-04-04 ENCOUNTER — Ambulatory Visit: Payer: Medicare Other | Attending: Obstetrics & Gynecology | Admitting: Physical Therapy

## 2020-04-04 ENCOUNTER — Encounter: Payer: Self-pay | Admitting: Physical Therapy

## 2020-04-04 DIAGNOSIS — M62830 Muscle spasm of back: Secondary | ICD-10-CM | POA: Diagnosis not present

## 2020-04-04 DIAGNOSIS — M4125 Other idiopathic scoliosis, thoracolumbar region: Secondary | ICD-10-CM | POA: Insufficient documentation

## 2020-04-04 DIAGNOSIS — M6281 Muscle weakness (generalized): Secondary | ICD-10-CM | POA: Diagnosis not present

## 2020-04-04 DIAGNOSIS — R278 Other lack of coordination: Secondary | ICD-10-CM

## 2020-04-04 DIAGNOSIS — R293 Abnormal posture: Secondary | ICD-10-CM | POA: Diagnosis not present

## 2020-04-04 NOTE — Therapy (Signed)
[facility] [address] [place], [place], [address] Phone: [phone]   Fax:  [phone]  Physical Therapy Treatment  Patient Details  Name: Janet Nguyen MRN: [id number] Date of Birth: [birth year] Referring Provider (PT): Maisie Fus, MD   Encounter Date: 04/04/2020   PT End of Session - 04/04/20 1237    Visit Number 5    Date for PT Re-Evaluation 05/02/20    Authorization Type Medicare    Progress Note Due on Visit 10    PT Start Time 1102    PT Stop Time 1144    PT Time Calculation (min) 42 min    Activity Tolerance Patient tolerated treatment well    Behavior During Therapy [facility] for tasks assessed/performed           Past Medical History:  Diagnosis Date  . Arrhythmia    History of SVT with documented PVC'S and  PAC'S  12/08/12 Nuc stress test normal LV EF 74%  Event Monitor  12/01/12-01/03/13  . Atrial flutter ([place])   . Celiac disease    treated by Dr. [name]  . GERD (gastroesophageal reflux disease)   . Intervertebral disc stenosis of neural canal of cervical region   . Irregular heart beat 11/30/12   ECHO-EF 60-65%  . Osteoporosis   . PMR (polymyalgia rheumatica) (HCC)    Dr. Marijean Bravo; pt states she was diagnosed 10-15 years ago, not treated at this time or any issues that she is aware of.  . Scoliosis   . Scoliosis   . Sleep apnea 10/02/11 [facility]   Sleep study AHI -total sleep 10.3/hr  64.0/ hr during REM sleep.RDI 22.8/hr during total sleep 64.0/hr during REM sleep The lowest O2 sat during Non-REM and REM sleep was 86% and 88% respectively. 04/08/12 CPAP/BIPAP titration study [facility]    Past Surgical History:  Procedure Laterality Date  . APPENDECTOMY     ruptured at age 71 and had surgery  . CARDIAC CATHETERIZATION  01/27/06  . cataract surgery  2015   Dr. Herbert Deaner; March & April 2015    There were no vitals filed for this visit.    Subjective Assessment - 04/04/20 1103    Subjective I had a bad day Monday with loose stool, maybe from something I ate.  I think I became irritated from the [place] sample.  I stopped using it after 2 nights.  I have felt inflammed and irritated in the vulvar area x 1+ years.  Signif back pain yesterday sitting for a card game x 2 hours.    Pertinent History Celiac disease, upset stomach with eating, Hx of constipation but manages for daily BM, Hx of hemorrhoids, maybe have one now    Diagnostic tests none    Patient Stated Goals reduce leakage, have more confidence to go out into community    Currently in Pain? Yes    Pain Score 5     Pain Location Back                             [facility] Adult PT Treatment/Exercise - 04/04/20 0001      Self-Care   Self-Care Other Self-Care Comments    Other Self-Care Comments  vulvar skin care with handout, discussed some patterns that may be causing vulvar irritation and how to replace these needs with improved strategies to reduce irriation  Neuro Re-ed    Neuro Re-ed Details  continue quick flicks with goal of 7 in 10 sec      Manual Therapy   Manual Therapy Soft tissue mobilization;Myofascial release    Soft tissue mobilization bil upper traps, levator scapula    Myofascial Release bil pectorals, Lt latissimus, thoracic fascia around Lt ribcage                  PT Education - 04/04/20 1109    Education Details vulvar skin care    Person(s) Educated Patient    Methods Explanation;Handout    Comprehension Verbalized understanding;Verbal cues required            PT Short Term Goals - 03/28/20 1326      PT SHORT TERM GOAL #1   Title Pt will be ind with vulvar skin care for healthy tissues    Baseline trying samples    Status On-going      PT SHORT TERM GOAL #2   Title Pt will be able to perform at least 7 quick flicks with 4/5 strength in 10 sec    Status On-going      PT SHORT TERM GOAL #3   Title Pt will  report reduced incidence of urine leakage by at least 20%    Status Achieved             PT Long Term Goals - 03/28/20 1314      PT LONG TERM GOAL #1   Title Pt will be ind with advanced HEP    Status On-going      PT LONG TERM GOAL #2   Title Pt will report reduced leakage by at least 70% with cough/sneeze/laugh and passing of gas.    Baseline no episodes of urinary leakage since last visit, occassional pebble of stool escapes she didn't know was there    Status On-going      PT LONG TERM GOAL #3   Title Pt will report improved abdominal pain by at least 50% (not associated with eating).    Baseline working on trunk elongation for more pliability and room in abdominal canister    Status On-going      PT LONG TERM GOAL #4   Title -                 Plan - 04/04/20 1238    Clinical Impression Statement PT introdcued vulvar skin care today with discussion around new strategies/patterns of daily care that will help reduce irritation.  Pt reported she notices good quick flick contractions happening more automatically with sneezes and hasn't had leakage with this lately.  She has fluctuating stool consistency and is working on PF strength and splinting techniques with BMs.  PT continues to perform elongating STM and myofascial release in the presence of a very compressed spine and abdominal canister secondary to scoliosis to improve posture, pelvic pressure and muscle function.  Pt continues to benefit from skilled PT along POC.    Comorbidities scoliosis, sleep apnea, osteoporosis, polymyalgia rheumatica    PT Frequency 1x / week    PT Duration 8 weeks    PT Treatment/Interventions ADLs/Self Care Home Management;Biofeedback;Electrical Stimulation;Moist Heat;Neuromuscular re-education;Therapeutic exercise;Therapeutic activities;Patient/family education;Manual techniques;Passive range of motion;Joint Manipulations;Spinal Manipulations    PT Next Visit Plan check goals, f/u on  vulvar skin care, manual techniques for pressure management, trunk elongation    PT Home Exercise Plan The knack, fluid intake/bladder log, quick flicks 35H/GDJM, vulvar skin care/moisturizers, abd  massage, diaphragmatic breathing    Consulted and Agree with Plan of Care Patient           Patient will benefit from skilled therapeutic intervention in order to improve the following deficits and impairments:     Visit Diagnosis: Other lack of coordination  Abnormal posture  Muscle spasm of back  Muscle weakness (generalized)  Other idiopathic scoliosis, thoracolumbar region     Problem List Patient Active Problem List   Diagnosis Date Noted  . Pulmonary hypertension, unspecified ([place]) 04/14/2019  . Ischemic colitis ([place]) 04/14/2019  . Migraine with aura and without status migrainosus, not intractable 11/08/2018  . Degeneration of lumbar intervertebral disc 10/14/2018  . Hoarseness of voice 03/04/2018  . Abdominal pain 07/01/2017  . Diverticulitis, colon   . Metabolic acidosis, increased anion gap   . Constipation 04/14/2017  . Lumbar hernia 04/14/2017  . Presbycusis of both ears 01/10/2017  . Tinnitus aurium, bilateral 01/10/2017  . Gastroesophageal reflux disease 08/20/2016  . Hemoptysis 08/20/2016  . Obstructive sleep apnea of adult 08/20/2016  . Rhinitis, chronic 08/20/2016  . Throat pain in adult 08/20/2016  . Fatigue 12/23/2015  . Sciatica of right side 10/06/2015  . History of migraine headaches 10/06/2015  . Frequent PVCs 12/28/2013  . Premature atrial contractions 12/28/2013  . PSVT (paroxysmal supraventricular tachycardia) ([place]) 12/28/2013  . Heart palpitations 07/13/2013  . Sleep apnea 04/11/2013  . Scoliosis 04/11/2013  . Atrial flutter with rapid ventricular response ([place]) 11/29/2012  . Chest pain, atypical 11/29/2012  . Fibromyalgia syndrome 11/29/2012  . Chronic steroid use 11/29/2012    Baruch Merl, PT 04/04/20 12:42 PM   [facility] [address] [place], [place], [address] Phone: [phone]   Fax:  [phone]  Name: Janet Nguyen MRN: [id number] Date of Birth: 04-01-1932

## 2020-04-04 NOTE — Patient Instructions (Signed)
Irritants/Allergens ( on exposure, can cause immediate stinging or burning)  Body fluids: urine, feces, vaginal discharge, sweat, semen  Feminine Hygiene Products: douches, yogurt douche, feminine wipes, baby wipes, sanitary pads, panty liners, tampons, deodorants (including deodorants in tampons/pads), lotions, powders (including talcum powder), perfumes, shampoos,soaps  Sexual support: lubricants, condoms, diaphragms, spermicides, arousal stimulants  Laundry detergent, bleach, fabric softener  Cleansing products: soap, bubble baths and salts, shampoo, conditioner, perfumed toielt paper  Physical irritants: tight fitting clothes, Nylon underwear, synthetic undergarments/pantyhose, chemically treated clothing, latex, wash cloths, sponges, hot water, excessive washing, vigorous drying with towel/hair dryer  HPV medication, tea tree oil, [place]  Alcohol and astringents  Topical medicaments: Benzocaine, Neomycin, Chlorhexidine (in K-Y Jelly), Imidazole antifungal, Propylene glycol ( Preservative used in many products:, tea tree oil, preservatives and bases medications are placed in.   Adapted from the V Book by Noland Fordyce. Nicole Kindred, MD., and Lynnell Dike [facility] Books, 2002), and Proceedings in Obstetrics and Gynecology, 2014; 4(2):1    Gentle Vulvar care- Adapted from the National Vulvodynia Association and the V Book  Wear loose clothing.  Wear all-cotton underwear, and go without when at home. Avoid wearing pantyhose, wear thigh high or knee high pantyhose. If you do not use underwear with yoga pants, try using an all-cotton against the vulva. Tight clothing can be irritating to the vulva by increasing friction and exacerbate irritation and redness.  Wear loose fitting clothes when possible. When swimming, remove wet bathing suits and shower to remove cholerine from the area.Marland Kitchen  Avoid hot tubs and heavily chlorinated pools. Shower after exercise to eliminate excess consider avoiding  exercise that causes friction or pressure to the area such as horseback riding, bicycle riding, and spinning classes.   To cleanse the area, use your fingers instead of a washcloth and plain water.  If you feel you must use a cleanser, unscented, non-alkaline cleanser such as Cetaphil, or mild soaps made for sensitive skin such as Dove or Neurtrogena. Be sure any cleaners are unscented. Avoid rubbing the vulva. Soak for five minutes in lukewarm water to remove any residue of sweat or lotions or other products.  Pat dry, and apply any prescribed medication. Avoid products with multiple ingredients.  Even those that sound designed for vulvar care, like A& D Original Ointment, baby lotion, or Vagisil, contain chemicals that could irritate or cause contact dermatitis. Avoid using bubble bath, feminine hygiene sprays, or any type of perfumed creams or sprays near the vulva.  In the bathroom, forgo moistened wipes. If you want moisture, use a spray bottle with plain water, and then pat dry. Use soft, white, unscented toilet paper. Avoid repetitive back and forth motion or rubbing. Drink plenty of water and fluids to dilute urine. Concentrated urine can be irritating to the vulvar skin.   When doing laundry, use hypoallergenic laundry detergent that is very mild, such as those made for babies. Avoid using fabric softeners with undergarments and double rinse undergarments.  For sexual intercourse, be sure to use a lubricant that is without additives or preservatives.  Additives like bactericides, spermicides, warming agents, and flavors can cause vulvar irritation. Rinse the vulva after intercourse. A frozen gel pack wrapped in a towel can be used for irritation after exercise or intercourse.

## 2020-04-09 ENCOUNTER — Other Ambulatory Visit: Payer: Self-pay | Admitting: Cardiovascular Disease

## 2020-04-10 ENCOUNTER — Telehealth: Payer: Self-pay | Admitting: Oncology

## 2020-04-10 NOTE — Telephone Encounter (Signed)
Received a new hem referral from Dr. Harrington Challenger at [facility] for monocytosis. Ms. Caroll has been cld and scheduled to see Dr. Alen Blew on 7/23 at 11am. Pt aware to arrive 15 minutes early.

## 2020-04-11 ENCOUNTER — Encounter: Payer: Medicare Other | Admitting: Vascular Surgery

## 2020-04-11 ENCOUNTER — Encounter (HOSPITAL_COMMUNITY): Payer: Medicare Other

## 2020-04-13 ENCOUNTER — Encounter: Payer: Self-pay | Admitting: Physical Therapy

## 2020-04-13 ENCOUNTER — Other Ambulatory Visit: Payer: Self-pay

## 2020-04-13 ENCOUNTER — Ambulatory Visit: Payer: Medicare Other | Admitting: Physical Therapy

## 2020-04-13 DIAGNOSIS — M6281 Muscle weakness (generalized): Secondary | ICD-10-CM | POA: Diagnosis not present

## 2020-04-13 DIAGNOSIS — M62830 Muscle spasm of back: Secondary | ICD-10-CM | POA: Diagnosis not present

## 2020-04-13 DIAGNOSIS — M4125 Other idiopathic scoliosis, thoracolumbar region: Secondary | ICD-10-CM

## 2020-04-13 DIAGNOSIS — R278 Other lack of coordination: Secondary | ICD-10-CM | POA: Diagnosis not present

## 2020-04-13 DIAGNOSIS — R293 Abnormal posture: Secondary | ICD-10-CM | POA: Diagnosis not present

## 2020-04-13 NOTE — Therapy (Signed)
[facility] [address], [place], [place], [address] Phone: [phone]   Fax:  [phone]  Physical Therapy Treatment  Patient Details  Name: Janet Nguyen MRN: [id number] Date of Birth: [birth year] Referring Provider (PT): Maisie Fus, MD   Encounter Date: 04/13/2020   PT End of Session - 04/13/20 0930    Visit Number 6    Date for PT Re-Evaluation 05/02/20    Authorization Type Medicare    Progress Note Due on Visit 10    PT Start Time 0930    PT Stop Time 1013    PT Time Calculation (min) 43 min    Activity Tolerance Patient tolerated treatment well    Behavior During Therapy [facility] for tasks assessed/performed           Past Medical History:  Diagnosis Date  . Arrhythmia    History of SVT with documented PVC'S and  PAC'S  12/08/12 Nuc stress test normal LV EF 74%  Event Monitor  12/01/12-01/03/13  . Atrial flutter ([place])   . Celiac disease    treated by Dr. [name]  . GERD (gastroesophageal reflux disease)   . Intervertebral disc stenosis of neural canal of cervical region   . Irregular heart beat 11/30/12   ECHO-EF 60-65%  . Osteoporosis   . PMR (polymyalgia rheumatica) (HCC)    Dr. Marijean Bravo; pt states she was diagnosed 10-15 years ago, not treated at this time or any issues that she is aware of.  . Scoliosis   . Scoliosis   . Sleep apnea 10/02/11 [facility]   Sleep study AHI -total sleep 10.3/hr  64.0/ hr during REM sleep.RDI 22.8/hr during total sleep 64.0/hr during REM sleep The lowest O2 sat during Non-REM and REM sleep was 86% and 88% respectively. 04/08/12 CPAP/BIPAP titration study [facility]    Past Surgical History:  Procedure Laterality Date  . APPENDECTOMY     ruptured at age 46 and had surgery  . CARDIAC CATHETERIZATION  01/27/06  . cataract surgery  2015   Dr. Herbert Deaner; March & April 2015    There were no vitals filed for this visit.    Subjective Assessment - 04/13/20 0931    Subjective My stomach is always uneasy in the AM and this is an early appt for me. I am patting dry after toileting vs wiping which is helping my tissues not be so irritated.  I had a little drop of urine getting to bathroom in night last night.  I think I was in heavier sleep than usual from some meds.    Pertinent History Celiac disease, upset stomach with eating, Hx of constipation but manages for daily BM, Hx of hemorrhoids, maybe have one now    Diagnostic tests none    Patient Stated Goals reduce leakage, have more confidence to go out into community    Currently in Pain? Yes    Pain Score 8    stomach, back   Pain Location Abdomen    Pain Orientation Left    Pain Descriptors / Indicators Aching    Pain Type Chronic pain    Pain Onset More than a month ago    Pain Frequency Constant                             [facility] Adult PT Treatment/Exercise - 04/13/20 0001      Neuro Re-ed  Neuro Re-ed Details  puffer training in Rt SL, initially bulged and descended but PT gave VCs for lift and tighten, Pt able to demo x 10 reps, changed quick flicks to puffer training for HEP to simulate control of pressures with cough/sneeze      Manual Therapy   Manual Therapy Soft tissue mobilization;Myofascial release    Soft tissue mobilization Rt QL, obliques, lats for elongation, bil upper traps, pectorals    Myofascial Release pectoral fascia bil from chair                    PT Short Term Goals - 03/28/20 1326      PT SHORT TERM GOAL #1   Title Pt will be ind with vulvar skin care for healthy tissues    Baseline trying samples    Status On-going      PT SHORT TERM GOAL #2   Title Pt will be able to perform at least 7 quick flicks with 4/5 strength in 10 sec    Status On-going      PT SHORT TERM GOAL #3   Title Pt will report reduced incidence of urine leakage by at least 20%    Status Achieved             PT Long  Term Goals - 04/13/20 0930      PT LONG TERM GOAL #1   Title Pt will be ind with advanced HEP    Status On-going      PT LONG TERM GOAL #2   Title Pt will report reduced leakage by at least 70% with cough/sneeze/laugh and passing of gas.    Baseline had a small drop of leakage last night on way to bathroom    Status On-going      PT LONG TERM GOAL #3   Title Pt will report improved abdominal pain by at least 50% (not associated with eating).    Baseline working on trunk elongation for more pliability and room in abdominal canister    Status On-going                 Plan - 04/13/20 1155    Clinical Impression Statement Pt is able to demo proper quick flicks with 9 reps in 10 sec, meeting LTG.  Pt reports reduced irritation of vulvar skin since learning to pat dry vs wiping after urinating.  She has intermittent drops of urine leakage with trips to bathroom at night or with cough/sneeze but this is a reduced amount and happening less often.  PT performed puffer training with Pt in SL with palpation and self-palpation through clothes for feedback on goal of PF lift vs bulge/descend.  Initially Pt bulged but with VCs she was able to demo lift repeatedly.  PT progressed HEP from quick flicks to puffer training 10x, 3 times a day to simulate improved pressure management for cough/sneeze.  Pt continues to have very limited abdominal cansiter and adaptively shortened muscles in Lt lower quadrant and bil pectorals and obliques secondary to rigid scoliosis.  She benefits greatly from elongation massage techniques which PT perofrmed today.  Continue along POC.    PT Frequency 1x / week    PT Duration 8 weeks    PT Treatment/Interventions ADLs/Self Care Home Management;Biofeedback;Electrical Stimulation;Moist Heat;Neuromuscular re-education;Therapeutic exercise;Therapeutic activities;Patient/family education;Manual techniques;Passive range of motion;Joint Manipulations;Spinal Manipulations    PT  Next Visit Plan f/u on puffer training/review this, trunk elongation , work on breathing into 360 ribcage  PT Home Exercise Plan puffer training 3x/day x 10 reps    Consulted and Agree with Plan of Care Patient           Patient will benefit from skilled therapeutic intervention in order to improve the following deficits and impairments:     Visit Diagnosis: Other lack of coordination  Abnormal posture  Muscle spasm of back  Muscle weakness (generalized)  Other idiopathic scoliosis, thoracolumbar region     Problem List Patient Active Problem List   Diagnosis Date Noted  . Pulmonary hypertension, unspecified ([place]) 04/14/2019  . Ischemic colitis ([place]) 04/14/2019  . Migraine with aura and without status migrainosus, not intractable 11/08/2018  . Degeneration of lumbar intervertebral disc 10/14/2018  . Hoarseness of voice 03/04/2018  . Abdominal pain 07/01/2017  . Diverticulitis, colon   . Metabolic acidosis, increased anion gap   . Constipation 04/14/2017  . Lumbar hernia 04/14/2017  . Presbycusis of both ears 01/10/2017  . Tinnitus aurium, bilateral 01/10/2017  . Gastroesophageal reflux disease 08/20/2016  . Hemoptysis 08/20/2016  . Obstructive sleep apnea of adult 08/20/2016  . Rhinitis, chronic 08/20/2016  . Throat pain in adult 08/20/2016  . Fatigue 12/23/2015  . Sciatica of right side 10/06/2015  . History of migraine headaches 10/06/2015  . Frequent PVCs 12/28/2013  . Premature atrial contractions 12/28/2013  . PSVT (paroxysmal supraventricular tachycardia) ([place]) 12/28/2013  . Heart palpitations 07/13/2013  . Sleep apnea 04/11/2013  . Scoliosis 04/11/2013  . Atrial flutter with rapid ventricular response ([place]) 11/29/2012  . Chest pain, atypical 11/29/2012  . Fibromyalgia syndrome 11/29/2012  . Chronic steroid use 11/29/2012    Baruch Merl, PT 04/13/20 12:03 PM   [facility] [address] [place], [place], [address] Phone: [phone]   Fax:  [phone]  Name: Janet Nguyen MRN: [id number] Date of Birth: 1932/05/31

## 2020-04-20 ENCOUNTER — Inpatient Hospital Stay: Payer: Medicare Other | Attending: Oncology | Admitting: Oncology

## 2020-04-20 ENCOUNTER — Inpatient Hospital Stay: Payer: Medicare Other

## 2020-04-20 ENCOUNTER — Other Ambulatory Visit: Payer: Self-pay

## 2020-04-20 VITALS — BP 134/78 | HR 61 | Temp 97.5°F | Resp 18 | Wt 118.5 lb

## 2020-04-20 DIAGNOSIS — R140 Abdominal distension (gaseous): Secondary | ICD-10-CM

## 2020-04-20 DIAGNOSIS — I10 Essential (primary) hypertension: Secondary | ICD-10-CM | POA: Diagnosis not present

## 2020-04-20 DIAGNOSIS — D72821 Monocytosis (symptomatic): Secondary | ICD-10-CM

## 2020-04-20 DIAGNOSIS — M419 Scoliosis, unspecified: Secondary | ICD-10-CM | POA: Insufficient documentation

## 2020-04-20 DIAGNOSIS — R1032 Left lower quadrant pain: Secondary | ICD-10-CM

## 2020-04-20 DIAGNOSIS — K900 Celiac disease: Secondary | ICD-10-CM | POA: Insufficient documentation

## 2020-04-20 DIAGNOSIS — Z803 Family history of malignant neoplasm of breast: Secondary | ICD-10-CM | POA: Insufficient documentation

## 2020-04-20 LAB — CBC WITH DIFFERENTIAL (CANCER CENTER ONLY)
Abs Immature Granulocytes: 0.02 10*3/uL (ref 0.00–0.07)
Basophils Absolute: 0.1 10*3/uL (ref 0.0–0.1)
Basophils Relative: 1 %
Eosinophils Absolute: 0.1 10*3/uL (ref 0.0–0.5)
Eosinophils Relative: 2 %
HCT: 37.9 % (ref 36.0–46.0)
Hemoglobin: 12.4 g/dL (ref 12.0–15.0)
Immature Granulocytes: 0 %
Lymphocytes Relative: 31 %
Lymphs Abs: 2.7 10*3/uL (ref 0.7–4.0)
MCH: 29.7 pg (ref 26.0–34.0)
MCHC: 32.7 g/dL (ref 30.0–36.0)
MCV: 90.7 fL (ref 80.0–100.0)
Monocytes Absolute: 1 10*3/uL (ref 0.1–1.0)
Monocytes Relative: 12 %
Neutro Abs: 4.7 10*3/uL (ref 1.7–7.7)
Neutrophils Relative %: 54 %
Platelet Count: 267 10*3/uL (ref 150–400)
RBC: 4.18 MIL/uL (ref 3.87–5.11)
RDW: 12.9 % (ref 11.5–15.5)
WBC Count: 8.7 10*3/uL (ref 4.0–10.5)
nRBC: 0 % (ref 0.0–0.2)

## 2020-04-20 NOTE — Progress Notes (Signed)
Reason for the request:    Monocytosis  HPI: I was asked by Dr. Harrington Challenger to evaluate Janet Nguyen for abnormal CBC.  She is an 86 year old woman with history of celiac disease, scoliosis and hypertension.  She had a CBC done on March 29, 2020 showed a white cell count of 8.3, hemoglobin of 12.4 and a platelet count of 279.  Her differential was normal but did have mild monocytosis with monocyte percentage is 14.9 and absolute monocyte count was 1200 with normal limits only around 800.  Otherwise the differential was normal.  Clinically, she reports no recent infections or hospitalizations.  She denies any constitutional symptoms of fevers chills sweats or weight loss.  She did report some abdominal distention and bloating at times and worsening of her scoliosis.  She continues to live independently and remain active without any decline in ability to control.  She does not report any headaches, blurry vision, syncope or seizures. Does not report any fevers, chills or sweats.  Does not report any cough, wheezing or hemoptysis.  Does not report any chest pain, palpitation, orthopnea or leg edema.  Does not report any nausea, vomiting or abdominal pain.  Does not report any constipation or diarrhea.  Does not report any skeletal complaints.    Does not report frequency, urgency or hematuria.  Does not report any skin rashes or lesions. Does not report any heat or cold intolerance.  Does not report any lymphadenopathy or petechiae.  Does not report any anxiety or depression.  Remaining review of systems is negative.    Past Medical History:  Diagnosis Date  . Arrhythmia    History of SVT with documented PVC'S and  PAC'S  12/08/12 Nuc stress test normal LV EF 74%  Event Monitor  12/01/12-01/03/13  . Atrial flutter ([place])   . Celiac disease    treated by Dr. [name]  . GERD (gastroesophageal reflux disease)   . Intervertebral disc stenosis of neural canal of cervical region   . Irregular heart beat 11/30/12    ECHO-EF 60-65%  . Osteoporosis   . PMR (polymyalgia rheumatica) (HCC)    Dr. Marijean Bravo; pt states she was diagnosed 10-15 years ago, not treated at this time or any issues that she is aware of.  . Scoliosis   . Scoliosis   . Sleep apnea 10/02/11 [facility]   Sleep study AHI -total sleep 10.3/hr  64.0/ hr during REM sleep.RDI 22.8/hr during total sleep 64.0/hr during REM sleep The lowest O2 sat during Non-REM and REM sleep was 86% and 88% respectively. 04/08/12 CPAP/BIPAP titration study [facility]  :  Past Surgical History:  Procedure Laterality Date  . APPENDECTOMY     ruptured at age 55 and had surgery  . CARDIAC CATHETERIZATION  01/27/06  . cataract surgery  2015   Dr. Herbert Deaner; March & April 2015  :   Current Outpatient Medications:  .  ALPRAZolam (XANAX) 0.5 MG tablet, Take 0.5 mg by mouth as needed., Disp: , Rfl:  .  amLODipine (NORVASC) 5 MG tablet, TAKE ONE TABLET BY MOUTH DAILY, Disp: 90 tablet, Rfl: 3 .  clidinium-chlordiazePOXIDE (LIBRAX) 5-2.5 MG capsule, Take 1 capsule by mouth as needed., Disp: , Rfl:  .  EQ ACETAMINOPHEN PO, Take 500 mg by mouth as needed (pain)., Disp: , Rfl:  .  FLUTICASONE PROPIONATE, NASAL, NA, Place into the nose daily as needed (rhinitis)., Disp: , Rfl:  .  metoprolol succinate (TOPROL-XL) 50 MG 24 hr tablet,  TAKE 1 AND 1/2 TABLETS BY MOUTH TWO TIMES A DAY, Disp: 270 tablet, Rfl: 0 .  Multiple Vitamins-Minerals (PRESERVISION AREDS 2 PO), Take 1 tablet by mouth every other day., Disp: , Rfl:  .  pantoprazole (PROTONIX) 20 MG tablet, Take 20 mg by mouth daily. , Disp: , Rfl:  .  Phenylephrine-APAP-guaiFENesin (EQ SINUS CONGESTION & PAIN PO), Take by mouth as needed (for sinus pain). Contains acetaminophen 325 mg and Phenylephrine 5 mg, Disp: , Rfl:  .  UNABLE TO FIND, Take by mouth. Med Name: VITAFUSION WOMEN'S GUMMIES ONCE-TWICE DAILY, Disp: , Rfl:  .  UNABLE TO FIND, Med Name: VITAFUSION CALCIUM PLUS D CHEWABLES,  ONCE-TWICE DAILY, Disp: , Rfl:  .  UNABLE TO FIND, Med Name: VITAFUSION OMEGA-3, ONCE-TWICE DAILY, Disp: , Rfl:  .  UNABLE TO FIND, 2 (two) times daily. Med Name: EQUATE FIBER POWDER/MIRALAX, Disp: , Rfl:  .  UNABLE TO FIND, as needed (itching ears). Med Name: FLUOCINONIDE TOPICAL SOLUTION (DROPS), Disp: , Rfl:  .  Lactobacillus (DIGESTIVE HEALTH PROBIOTIC PO), Take by mouth every other day. (Patient not taking: Reported on 04/20/2020), Disp: , Rfl:  .  metoprolol tartrate (LOPRESSOR) 25 MG tablet, Take 25 mg by mouth. Uses rarely as needed for atrial flutters, Disp: , Rfl:  .  Polyvinyl Alcohol-Povidone (REFRESH OP), Apply to eye at bedtime. , Disp: , Rfl:  .  UNABLE TO FIND, 1,000 mcg daily. Med Name: [place] BIOTIN, Disp: , Rfl:  .  ZOLPIDEM TARTRATE ER PO, Take 2.5 mg by mouth at bedtime. (Patient not taking: Reported on 04/20/2020), Disp: , Rfl: :  Allergies  Allergen Reactions  . Augmentin [Amoxicillin-Pot Clavulanate]     Has never had, son allergic   . Gluten Meal     Unknown  . Naproxen     Stomach upset  . Codeine Nausea Only  :  Family History  Problem Relation Age of Onset  . Breast cancer Mother   . Heart disease Father   . Migraines Neg Hx   :  Social History   Socioeconomic History  . Marital status: Divorced    Spouse name: Not on file  . Number of children: 4  . Years of education: college   . Highest education level: Not on file  Occupational History  . Occupation: retired   Tobacco Use  . Smoking status: Never Smoker  . Smokeless tobacco: Never Used  Vaping Use  . Vaping Use: Never used  Substance and Sexual Activity  . Alcohol use: Yes    Alcohol/week: 1.0 standard drink    Types: 1 Standard drinks or equivalent per week    Comment: drink ETOH socially  . Drug use: No  . Sexual activity: Not on file  Other Topics Concern  . Not on file  Social History Narrative   Lives alone at home   Drinks tea but tries to drink decaf   Has 9  grandchildren    Right handed   Social Determinants of Health   Financial Resource Strain:   . Difficulty of Paying Living Expenses:   Food Insecurity:   . Worried About Charity fundraiser in the Last Year:   . Arboriculturist in the Last Year:   Transportation Needs:   . Film/video editor (Medical):   Marland Kitchen Lack of Transportation (Non-Medical):   Physical Activity:   . Days of Exercise per Week:   . Minutes of Exercise per Session:   Stress:   .  Feeling of Stress :   Social Connections:   . Frequency of Communication with Friends and Family:   . Frequency of Social Gatherings with Friends and Family:   . Attends Religious Services:   . Active Member of Clubs or Organizations:   . Attends Archivist Meetings:   Marland Kitchen Marital Status:   Intimate Partner Violence:   . Fear of Current or Ex-Partner:   . Emotionally Abused:   Marland Kitchen Physically Abused:   . Sexually Abused:   :  Pertinent items are noted in HPI.  Exam: Blood pressure (!) 134/78, pulse 61, temperature (!) 97.5 F (36.4 C), temperature source Temporal, resp. rate 18, weight 118 lb 8 oz (53.8 kg), SpO2 98 %.  ECOG 0 General appearance: alert and cooperative appeared without distress. Head: atraumatic without any abnormalities. Eyes: conjunctivae/corneas clear. PERRL.  Sclera anicteric. Throat: lips, mucosa, and tongue normal; without oral thrush or ulcers. Resp: clear to auscultation bilaterally without rhonchi, wheezes or dullness to percussion. Cardio: regular rate and rhythm, S1, S2 normal, no murmur, click, rub or gallop GI: soft, non-tender; slightly distended with good bowel sounds.  I could not palpate spleen. Skin: Skin color, texture, turgor normal. No rashes or lesions Lymph nodes: Cervical, supraclavicular, and axillary nodes normal. Neurologic: Grossly normal without any motor, sensory or deep tendon reflexes. Musculoskeletal: No joint deformity or effusion.    Assessment and Plan:    86 year old woman with:  1.  Monocytosis detected on CBC on July 1 of 2021.  Her monocyte percentage for 14.9 with an absolute monocyte count of 1200.  She had a normal CBC otherwise.  Laboratory data dating back to 2011 have been fluctuating white cell count as high as 15,000 although no differential has been obtained in the past.  The differential diagnosis of these findings were discussed today.  Reactive monocytosis is the likely etiology at this time.  This could be related to her celiac disease, stress or subclinical infection.  Myelodysplastic syndrome or other bone marrow disorders are possibility but considered less likely.  To complete her work-up I will repeat his CBC, will differential as well as a peripheral smear review to rule out any primary hematological condition.  I do not see any reason for a bone marrow biopsy at this time.  These findings are likely benign and does not require any further intervention.  2.  Abdominal distention: None palpate spleen but she is concerned about this finding.  I will obtain abdominal ultrasound to evaluate for possible hepatosplenomegaly, ascites or other abnormalities.  3  Follow-up: Will be determined pending results of her work-up.  45  minutes were dedicated to this visit. The time was spent on reviewing laboratory data, discussing treatment options, discussing differential diagnosis and answering questions regarding future plan.    A copy of this consult has been forwarded to the requesting physician.

## 2020-04-23 ENCOUNTER — Ambulatory Visit: Payer: Medicare Other | Admitting: Physical Therapy

## 2020-04-23 ENCOUNTER — Other Ambulatory Visit: Payer: Self-pay

## 2020-04-23 ENCOUNTER — Encounter: Payer: Self-pay | Admitting: Physical Therapy

## 2020-04-23 DIAGNOSIS — M6281 Muscle weakness (generalized): Secondary | ICD-10-CM | POA: Diagnosis not present

## 2020-04-23 DIAGNOSIS — R278 Other lack of coordination: Secondary | ICD-10-CM

## 2020-04-23 DIAGNOSIS — M4125 Other idiopathic scoliosis, thoracolumbar region: Secondary | ICD-10-CM | POA: Diagnosis not present

## 2020-04-23 DIAGNOSIS — R293 Abnormal posture: Secondary | ICD-10-CM | POA: Diagnosis not present

## 2020-04-23 DIAGNOSIS — M62830 Muscle spasm of back: Secondary | ICD-10-CM | POA: Diagnosis not present

## 2020-04-23 NOTE — Therapy (Signed)
[facility] [address] [place], [place], [address] Phone: [phone]   Fax:  [phone]  Physical Therapy Treatment  Patient Details  Name: Janet Nguyen MRN: [id number] Date of Birth: 12-16-1931 Referring Provider (PT): Maisie Fus, MD   Encounter Date: 04/23/2020   PT End of Session - 04/23/20 1017    Visit Number 7    Date for PT Re-Evaluation 05/02/20    Authorization Type Medicare    Progress Note Due on Visit 10    PT Start Time 1017    PT Stop Time 1059    PT Time Calculation (min) 42 min    Activity Tolerance Patient tolerated treatment well    Behavior During Therapy [facility] for tasks assessed/performed           Past Medical History:  Diagnosis Date  . Arrhythmia    History of SVT with documented PVC'S and  PAC'S  12/08/12 Nuc stress test normal LV EF 74%  Event Monitor  12/01/12-01/03/13  . Atrial flutter ([place])   . Celiac disease    treated by Dr. [name]  . GERD (gastroesophageal reflux disease)   . Intervertebral disc stenosis of neural canal of cervical region   . Irregular heart beat 11/30/12   ECHO-EF 60-65%  . Osteoporosis   . PMR (polymyalgia rheumatica) (HCC)    Dr. Marijean Bravo; pt states she was diagnosed 10-15 years ago, not treated at this time or any issues that she is aware of.  . Scoliosis   . Scoliosis   . Sleep apnea 10/02/11 [facility]   Sleep study AHI -total sleep 10.3/hr  64.0/ hr during REM sleep.RDI 22.8/hr during total sleep 64.0/hr during REM sleep The lowest O2 sat during Non-REM and REM sleep was 86% and 88% respectively. 04/08/12 CPAP/BIPAP titration study [facility]    Past Surgical History:  Procedure Laterality Date  . APPENDECTOMY     ruptured at age 25 and had surgery  . CARDIAC CATHETERIZATION  01/27/06  . cataract surgery  2015   Dr. Herbert Deaner; March & April 2015    There were no vitals filed for this visit.    Subjective Assessment - 04/23/20 1018    Subjective I haven't had any leakage since last visit.  Less vulvar irritation with the changes I've made.  I do my pelvic floor exercises a lot.  I ordered a commode sitz bath and it has arrived but I haven't tried it yet.  I have such trouble breathing especially at night.    Pertinent History Celiac disease, upset stomach with eating, Hx of constipation but manages for daily BM, Hx of hemorrhoids, maybe have one now    Diagnostic tests none    Patient Stated Goals reduce leakage, have more confidence to go out into community    Currently in Pain? Yes    Pain Score 8     Pain Location Chest    Pain Orientation Anterior    Pain Descriptors / Indicators Other (Comment);Tightness   hard to get good breath   Pain Onset More than a month ago                             [facility] Adult PT Treatment/Exercise - 04/23/20 0001      Self-Care   Self-Care Other Self-Care Comments    Other Self-Care Comments  meditation benefits, breathe into areas of tightenss  during stretching      Neuro Re-ed    Neuro Re-ed Details  breathe into stretch in Lt obliques and QL      Lumbar Exercises: Standing   Other Standing Lumbar Exercises Lt shoulder flexion along doorway, lean Rt to elongate Lt side of spine      Manual Therapy   Manual Therapy Soft tissue mobilization    Soft tissue mobilization bil obliques, Lt lat at shoulder, Lt QL, bil upper traps                    PT Short Term Goals - 03/28/20 1326      PT SHORT TERM GOAL #1   Title Pt will be ind with vulvar skin care for healthy tissues    Baseline trying samples    Status On-going      PT SHORT TERM GOAL #2   Title Pt will be able to perform at least 7 quick flicks with 4/5 strength in 10 sec    Status On-going      PT SHORT TERM GOAL #3   Title Pt will report reduced incidence of urine leakage by at least 20%    Status Achieved             PT Long Term Goals -  04/13/20 0930      PT LONG TERM GOAL #1   Title Pt will be ind with advanced HEP    Status On-going      PT LONG TERM GOAL #2   Title Pt will report reduced leakage by at least 70% with cough/sneeze/laugh and passing of gas.    Baseline had a small drop of leakage last night on way to bathroom    Status On-going      PT LONG TERM GOAL #3   Title Pt will report improved abdominal pain by at least 50% (not associated with eating).    Baseline working on trunk elongation for more pliability and room in abdominal canister    Status On-going                 Plan - 04/23/20 1305    Clinical Impression Statement Pt reports she has not had any leakage since last visit over 1 week ago.  She is compliant with HEP including puffer training added last visit.  She continues to void approx 1-2 times/night but does not have much output, responding more to a fear of leakage.  She feels stress about difficulty breathing due to compressed spine and limited ribcage expansion secondary to scoliosis.  PT performed manual techniques and standing stetching for elongation and cued Pt to breathe into tightness.  She has difficulty with diaphragmatic breathing.  Pt will continue to benefit from skilled PT along POC with ongoing assessment.    Comorbidities scoliosis, sleep apnea, osteoporosis, polymyalgia rheumatica    Rehab Potential Good    PT Frequency 1x / week    PT Duration 8 weeks    PT Treatment/Interventions ADLs/Self Care Home Management;Biofeedback;Electrical Stimulation;Moist Heat;Neuromuscular re-education;Therapeutic exercise;Therapeutic activities;Patient/family education;Manual techniques;Passive range of motion;Joint Manipulations;Spinal Manipulations    PT Next Visit Plan continue elongation strategies, work on breathing and ribcage expansion, Lt QL STM/stretching    PT Home Exercise Plan puffer training 3x/day x 10 reps    Consulted and Agree with Plan of Care Patient            Patient will benefit from skilled therapeutic intervention in order to improve the following deficits  and impairments:     Visit Diagnosis: Other lack of coordination  Abnormal posture  Other idiopathic scoliosis, thoracolumbar region     Problem List Patient Active Problem List   Diagnosis Date Noted  . Pulmonary hypertension, unspecified ([place]) 04/14/2019  . Ischemic colitis ([place]) 04/14/2019  . Migraine with aura and without status migrainosus, not intractable 11/08/2018  . Degeneration of lumbar intervertebral disc 10/14/2018  . Hoarseness of voice 03/04/2018  . Abdominal pain 07/01/2017  . Diverticulitis, colon   . Metabolic acidosis, increased anion gap   . Constipation 04/14/2017  . Lumbar hernia 04/14/2017  . Presbycusis of both ears 01/10/2017  . Tinnitus aurium, bilateral 01/10/2017  . Gastroesophageal reflux disease 08/20/2016  . Hemoptysis 08/20/2016  . Obstructive sleep apnea of adult 08/20/2016  . Rhinitis, chronic 08/20/2016  . Throat pain in adult 08/20/2016  . Fatigue 12/23/2015  . Sciatica of right side 10/06/2015  . History of migraine headaches 10/06/2015  . Frequent PVCs 12/28/2013  . Premature atrial contractions 12/28/2013  . PSVT (paroxysmal supraventricular tachycardia) ([place]) 12/28/2013  . Heart palpitations 07/13/2013  . Sleep apnea 04/11/2013  . Scoliosis 04/11/2013  . Atrial flutter with rapid ventricular response ([place]) 11/29/2012  . Chest pain, atypical 11/29/2012  . Fibromyalgia syndrome 11/29/2012  . Chronic steroid use 11/29/2012    Baruch Merl, PT 04/23/20 1:09 PM   [facility] [address], [place], [place], [address] Phone: [phone]   Fax:  [phone]  Name: Janet Nguyen MRN: [id number] Date of Birth: 1932-05-16

## 2020-04-25 ENCOUNTER — Ambulatory Visit (HOSPITAL_COMMUNITY): Payer: Medicare Other

## 2020-04-26 ENCOUNTER — Ambulatory Visit (HOSPITAL_COMMUNITY)
Admission: RE | Admit: 2020-04-26 | Discharge: 2020-04-26 | Disposition: A | Discharge status: Home / Self Care | Payer: Medicare Other | Source: Ambulatory Visit | Attending: Oncology | Admitting: Oncology

## 2020-04-26 ENCOUNTER — Other Ambulatory Visit: Payer: Self-pay

## 2020-04-26 ENCOUNTER — Encounter (HOSPITAL_COMMUNITY): Payer: Self-pay

## 2020-04-26 ENCOUNTER — Other Ambulatory Visit: Payer: Self-pay | Admitting: Oncology

## 2020-04-26 ENCOUNTER — Telehealth: Payer: Self-pay

## 2020-04-26 DIAGNOSIS — R140 Abdominal distension (gaseous): Secondary | ICD-10-CM

## 2020-04-26 DIAGNOSIS — R1032 Left lower quadrant pain: Secondary | ICD-10-CM

## 2020-04-26 DIAGNOSIS — D72821 Monocytosis (symptomatic): Secondary | ICD-10-CM

## 2020-04-26 NOTE — Telephone Encounter (Signed)
TC from [place] in ultrasound stating Pt. Needs her order changed to a complete ultrasound. Lattie Haw asking if prior authorization is needed. Spoke with Dr. Alen Blew. Request put in for prior authorization and was approved. TC to [place] in ultrasound she stated she will call scheduling to put pt back on the schedule.

## 2020-04-30 ENCOUNTER — Encounter: Payer: Self-pay | Admitting: Physical Therapy

## 2020-04-30 ENCOUNTER — Ambulatory Visit: Payer: Medicare Other | Attending: Obstetrics & Gynecology | Admitting: Physical Therapy

## 2020-04-30 ENCOUNTER — Other Ambulatory Visit: Payer: Self-pay

## 2020-04-30 DIAGNOSIS — M62830 Muscle spasm of back: Secondary | ICD-10-CM | POA: Insufficient documentation

## 2020-04-30 DIAGNOSIS — M6281 Muscle weakness (generalized): Secondary | ICD-10-CM | POA: Diagnosis not present

## 2020-04-30 DIAGNOSIS — R278 Other lack of coordination: Secondary | ICD-10-CM | POA: Diagnosis not present

## 2020-04-30 DIAGNOSIS — R293 Abnormal posture: Secondary | ICD-10-CM | POA: Insufficient documentation

## 2020-04-30 DIAGNOSIS — M4125 Other idiopathic scoliosis, thoracolumbar region: Secondary | ICD-10-CM | POA: Diagnosis not present

## 2020-04-30 NOTE — Therapy (Signed)
[facility] [address], [place], [place], [address] Phone: [phone]   Fax:  [phone]  Physical Therapy Treatment  Patient Details  Name: Janet Nguyen MRN: [id number] Date of Birth: [birth year] Referring Provider (PT): Maisie Fus, MD   Encounter Date: 04/30/2020   PT End of Session - 04/30/20 1017    Visit Number 8    Date for PT Re-Evaluation 05/02/20    Authorization Type Medicare    PT Start Time 1017    PT Stop Time 1059    PT Time Calculation (min) 42 min    Activity Tolerance Patient tolerated treatment well    Behavior During Therapy [facility] for tasks assessed/performed           Past Medical History:  Diagnosis Date   Arrhythmia    History of SVT with documented PVC'S and  PAC'S  12/08/12 Nuc stress test normal LV EF 74%  Event Monitor  12/01/12-01/03/13   Atrial flutter ([place])    Celiac disease    treated by Dr. [name]   GERD (gastroesophageal reflux disease)    Intervertebral disc stenosis of neural canal of cervical region    Irregular heart beat 11/30/12   ECHO-EF 60-65%   Osteoporosis    PMR (polymyalgia rheumatica) (HCC)    Dr. Marijean Bravo; pt states she was diagnosed 10-15 years ago, not treated at this time or any issues that she is aware of.   Scoliosis    Scoliosis    Sleep apnea 10/02/11 [facility]   Sleep study AHI -total sleep 10.3/hr  64.0/ hr during REM sleep.RDI 22.8/hr during total sleep 64.0/hr during REM sleep The lowest O2 sat during Non-REM and REM sleep was 86% and 88% respectively. 04/08/12 CPAP/BIPAP titration study [facility]    Past Surgical History:  Procedure Laterality Date   APPENDECTOMY     ruptured at age 65 and had surgery   CARDIAC CATHETERIZATION  01/27/06   cataract surgery  2015   Dr. Herbert Deaner; March & April 2015    There were no vitals filed for this visit.   Subjective Assessment - 04/30/20 1018     Subjective I feel like I'm in a lot of brain fog today.  Hard to think and focus. I am definitely improving with the leakage but it's not totally gone.  I still get up in the night to urinate.  I have better understanding of how to do the exercises and use the muscles.    Pertinent History Celiac disease, upset stomach with eating, Hx of constipation but manages for daily BM, Hx of hemorrhoids, maybe have one now    Diagnostic tests none    Patient Stated Goals reduce leakage, have more confidence to go out into community    Currently in Pain? Yes    Pain Score 7     Pain Location Neck    Pain Orientation Posterior    Pain Onset More than a month ago    Pain Frequency Constant                             [facility] Adult PT Treatment/Exercise - 04/30/20 0001      Lumbar Exercises: Standing   Other Standing Lumbar Exercises reviewed HEP verbally      Manual Therapy   Manual Therapy Soft tissue mobilization;Myofascial release    Manual therapy comments seated  Soft tissue mobilization bil obliques, Lt lat at shoulder, Lt QL, bil upper traps, intrascapular region bil    Myofascial Release bil ribcage intercostals and obliques seated                    PT Short Term Goals - 04/30/20 1235      PT SHORT TERM GOAL #1   Title Pt will be ind with vulvar skin care for healthy tissues    Status Achieved      PT SHORT TERM GOAL #2   Title Pt will be able to perform at least 7 quick flicks with 4/5 strength in 10 sec    Status Achieved      PT SHORT TERM GOAL #3   Title Pt will report reduced incidence of urine leakage by at least 20%    Status Achieved             PT Long Term Goals - 04/30/20 1033      PT LONG TERM GOAL #1   Title Pt will be ind with advanced HEP    Status Achieved      PT LONG TERM GOAL #2   Title Pt will report reduced leakage by at least 70% with cough/sneeze/laugh and passing of gas.    Status Achieved      PT LONG TERM GOAL #3    Title Pt will report improved abdominal pain by at least 50% (not associated with eating).    Baseline having [place] this week, no chance with PT    Status Not Met                 Plan - 04/30/20 1231    Clinical Impression Statement Pt reports improved leakage incidence especially during the day and with stressors such as cough, laugh.  She continues to rise to void during the night but is able to use PF contraction to maintain control on way to bathroom.  She reports spraying of stream on occassion so PT explained she needs to release the PF contraction fully before voiding.  Pt continues to have chronic abdominal pain with eating due to compressed abdominal canister for scoliosis.  She met all LTGs related to incontinence and PF strength but PT for this area did not change her abdominal pain with eating.  Pt will be getting an [place] this week to follow up on this.  PT and Pt agreed on readiness for d/c to HEP for PF dysfunction at this time.  She may benefit from PT in the future for soft tissue work of trunk and abdomen secondary to scoliosis-related adaptive shortening of tissues.    Comorbidities scoliosis, sleep apnea, osteoporosis, polymyalgia rheumatica    Rehab Potential Good    PT Frequency 1x / week    PT Duration 8 weeks    PT Treatment/Interventions ADLs/Self Care Home Management;Biofeedback;Electrical Stimulation;Moist Heat;Neuromuscular re-education;Therapeutic exercise;Therapeutic activities;Patient/family education;Manual techniques;Passive range of motion;Joint Manipulations;Spinal Manipulations    PT Next Visit Plan d/c to HEP    PT Home Exercise Plan continue PF training    Consulted and Agree with Plan of Care Patient           Patient will benefit from skilled therapeutic intervention in order to improve the following deficits and impairments:     Visit Diagnosis: Other lack of coordination  Abnormal posture  Other idiopathic scoliosis, thoracolumbar  region  Muscle spasm of back  Muscle weakness (generalized)     Problem List Patient Active  Problem List   Diagnosis Date Noted   Pulmonary hypertension, unspecified ([place]) 04/14/2019   Ischemic colitis ([place]) 04/14/2019   Migraine with aura and without status migrainosus, not intractable 11/08/2018   Degeneration of lumbar intervertebral disc 10/14/2018   Hoarseness of voice 03/04/2018   Abdominal pain 07/01/2017   Diverticulitis, colon    Metabolic acidosis, increased anion gap    Constipation 04/14/2017   Lumbar hernia 04/14/2017   Presbycusis of both ears 01/10/2017   Tinnitus aurium, bilateral 01/10/2017   Gastroesophageal reflux disease 08/20/2016   Hemoptysis 08/20/2016   Obstructive sleep apnea of adult 08/20/2016   Rhinitis, chronic 08/20/2016   Throat pain in adult 08/20/2016   Fatigue 12/23/2015   Sciatica of right side 10/06/2015   History of migraine headaches 10/06/2015   Frequent PVCs 12/28/2013   Premature atrial contractions 12/28/2013   PSVT (paroxysmal supraventricular tachycardia) ([place]) 12/28/2013   Heart palpitations 07/13/2013   Sleep apnea 04/11/2013   Scoliosis 04/11/2013   Atrial flutter with rapid ventricular response ([place]) 11/29/2012   Chest pain, atypical 11/29/2012   Fibromyalgia syndrome 11/29/2012   Chronic steroid use 11/29/2012    PHYSICAL THERAPY DISCHARGE SUMMARY  Visits from Start of Care: 8  Current functional level related to goals / functional outcomes: See above   Remaining deficits: See above   Education / Equipment: HEP for pelvic floor strength and endurance Plan: Patient agrees to discharge.  Patient goals were partially met. Patient is being discharged due to being pleased with the current functional level.  ?????         Baruch Merl, PT 04/30/20 12:37 PM   [facility] [address] [place], [place],  [address] Phone: [phone]   Fax:  [phone]  Name: Janet Nguyen MRN: [id number] Date of Birth: Sep 13, 1932

## 2020-05-01 DIAGNOSIS — R2232 Localized swelling, mass and lump, left upper limb: Secondary | ICD-10-CM | POA: Diagnosis not present

## 2020-05-01 DIAGNOSIS — M79645 Pain in left finger(s): Secondary | ICD-10-CM | POA: Diagnosis not present

## 2020-05-02 ENCOUNTER — Telehealth: Payer: Self-pay

## 2020-05-02 ENCOUNTER — Other Ambulatory Visit: Payer: Self-pay

## 2020-05-02 ENCOUNTER — Ambulatory Visit (HOSPITAL_COMMUNITY)
Admission: RE | Admit: 2020-05-02 | Discharge: 2020-05-02 | Disposition: A | Discharge status: Home / Self Care | Payer: Medicare Other | Source: Ambulatory Visit | Attending: Oncology | Admitting: Oncology

## 2020-05-02 DIAGNOSIS — D72821 Monocytosis (symptomatic): Secondary | ICD-10-CM | POA: Diagnosis not present

## 2020-05-02 DIAGNOSIS — R229 Localized swelling, mass and lump, unspecified: Secondary | ICD-10-CM | POA: Insufficient documentation

## 2020-05-02 DIAGNOSIS — K8020 Calculus of gallbladder without cholecystitis without obstruction: Secondary | ICD-10-CM | POA: Diagnosis not present

## 2020-05-02 DIAGNOSIS — K828 Other specified diseases of gallbladder: Secondary | ICD-10-CM | POA: Diagnosis not present

## 2020-05-02 DIAGNOSIS — R1032 Left lower quadrant pain: Secondary | ICD-10-CM | POA: Insufficient documentation

## 2020-05-02 NOTE — Telephone Encounter (Signed)
-----   Message from Janet Portela, MD sent at 05/02/2020  2:55 PM EDT ----- Please let her know her labs and [place] are normal. Her spleen is normal. No follow up or additional testing is needed.

## 2020-05-02 NOTE — Telephone Encounter (Signed)
Per Dr. Alen Blew VM message left with [place] and Lab results. Pt informed on vm message if she has any further problems or concerns to return call to [facility].

## 2020-05-03 ENCOUNTER — Telehealth: Payer: Self-pay

## 2020-05-03 DIAGNOSIS — H538 Other visual disturbances: Secondary | ICD-10-CM | POA: Diagnosis not present

## 2020-05-03 DIAGNOSIS — H40013 Open angle with borderline findings, low risk, bilateral: Secondary | ICD-10-CM | POA: Diagnosis not present

## 2020-05-03 NOTE — Telephone Encounter (Signed)
Received voicemail from patient inquiring about labs and ultrasounds results from earlier this week.  Informed patient of Dr. Hazeline Junker response to lab work and [place] listed in note from Lenox Ponds, LPN on 11/27/40. Informed patient that information about the visit were sent to patient's PCP, Dr. Harrington Challenger.  Patient verbalized understanding and was instructed to call office with any further questions or concerns.

## 2020-05-07 ENCOUNTER — Encounter: Payer: Medicare Other | Admitting: Physical Therapy

## 2020-05-08 DIAGNOSIS — J342 Deviated nasal septum: Secondary | ICD-10-CM | POA: Diagnosis not present

## 2020-05-08 DIAGNOSIS — R040 Epistaxis: Secondary | ICD-10-CM | POA: Diagnosis not present

## 2020-05-08 DIAGNOSIS — G4733 Obstructive sleep apnea (adult) (pediatric): Secondary | ICD-10-CM | POA: Diagnosis not present

## 2020-05-15 ENCOUNTER — Other Ambulatory Visit: Payer: Self-pay

## 2020-05-15 DIAGNOSIS — I8390 Asymptomatic varicose veins of unspecified lower extremity: Secondary | ICD-10-CM

## 2020-05-18 DIAGNOSIS — R109 Unspecified abdominal pain: Secondary | ICD-10-CM | POA: Diagnosis not present

## 2020-05-18 DIAGNOSIS — G9001 Carotid sinus syncope: Secondary | ICD-10-CM | POA: Diagnosis not present

## 2020-05-18 DIAGNOSIS — H6980 Other specified disorders of Eustachian tube, unspecified ear: Secondary | ICD-10-CM | POA: Diagnosis not present

## 2020-05-18 DIAGNOSIS — R42 Dizziness and giddiness: Secondary | ICD-10-CM | POA: Diagnosis not present

## 2020-05-18 DIAGNOSIS — G43909 Migraine, unspecified, not intractable, without status migrainosus: Secondary | ICD-10-CM | POA: Diagnosis not present

## 2020-05-19 ENCOUNTER — Other Ambulatory Visit: Payer: Self-pay | Admitting: Cardiovascular Disease

## 2020-05-23 ENCOUNTER — Ambulatory Visit: Payer: Medicare Other | Admitting: Physical Therapy

## 2020-05-24 DIAGNOSIS — Z0000 Encounter for general adult medical examination without abnormal findings: Secondary | ICD-10-CM | POA: Diagnosis not present

## 2020-05-24 DIAGNOSIS — M542 Cervicalgia: Secondary | ICD-10-CM | POA: Diagnosis not present

## 2020-05-24 DIAGNOSIS — M810 Age-related osteoporosis without current pathological fracture: Secondary | ICD-10-CM | POA: Diagnosis not present

## 2020-05-24 DIAGNOSIS — Z8739 Personal history of other diseases of the musculoskeletal system and connective tissue: Secondary | ICD-10-CM | POA: Diagnosis not present

## 2020-05-24 DIAGNOSIS — K219 Gastro-esophageal reflux disease without esophagitis: Secondary | ICD-10-CM | POA: Diagnosis not present

## 2020-05-24 DIAGNOSIS — R0981 Nasal congestion: Secondary | ICD-10-CM | POA: Diagnosis not present

## 2020-05-24 DIAGNOSIS — F419 Anxiety disorder, unspecified: Secondary | ICD-10-CM | POA: Diagnosis not present

## 2020-05-24 DIAGNOSIS — R0989 Other specified symptoms and signs involving the circulatory and respiratory systems: Secondary | ICD-10-CM | POA: Diagnosis not present

## 2020-05-24 DIAGNOSIS — G4733 Obstructive sleep apnea (adult) (pediatric): Secondary | ICD-10-CM | POA: Diagnosis not present

## 2020-05-24 DIAGNOSIS — I10 Essential (primary) hypertension: Secondary | ICD-10-CM | POA: Diagnosis not present

## 2020-05-24 DIAGNOSIS — G9001 Carotid sinus syncope: Secondary | ICD-10-CM | POA: Diagnosis not present

## 2020-05-24 DIAGNOSIS — K900 Celiac disease: Secondary | ICD-10-CM | POA: Diagnosis not present

## 2020-05-25 ENCOUNTER — Other Ambulatory Visit (HOSPITAL_COMMUNITY): Payer: Self-pay | Admitting: Family Medicine

## 2020-05-25 DIAGNOSIS — R0989 Other specified symptoms and signs involving the circulatory and respiratory systems: Secondary | ICD-10-CM

## 2020-05-28 DIAGNOSIS — M810 Age-related osteoporosis without current pathological fracture: Secondary | ICD-10-CM | POA: Diagnosis not present

## 2020-05-28 DIAGNOSIS — I10 Essential (primary) hypertension: Secondary | ICD-10-CM | POA: Diagnosis not present

## 2020-05-29 ENCOUNTER — Ambulatory Visit (HOSPITAL_COMMUNITY)
Admission: RE | Admit: 2020-05-29 | Discharge: 2020-05-29 | Disposition: A | Discharge status: Home / Self Care | Payer: Medicare Other | Source: Ambulatory Visit | Attending: Vascular Surgery | Admitting: Vascular Surgery

## 2020-05-29 ENCOUNTER — Encounter (HOSPITAL_COMMUNITY): Payer: Self-pay

## 2020-05-29 ENCOUNTER — Other Ambulatory Visit: Payer: Self-pay

## 2020-05-29 DIAGNOSIS — I8390 Asymptomatic varicose veins of unspecified lower extremity: Secondary | ICD-10-CM

## 2020-05-29 DIAGNOSIS — R0989 Other specified symptoms and signs involving the circulatory and respiratory systems: Secondary | ICD-10-CM | POA: Insufficient documentation

## 2020-05-30 ENCOUNTER — Ambulatory Visit (HOSPITAL_BASED_OUTPATIENT_CLINIC_OR_DEPARTMENT_OTHER)
Admission: RE | Admit: 2020-05-30 | Discharge: 2020-05-30 | Disposition: A | Discharge status: Home / Self Care | Payer: Medicare Other | Source: Ambulatory Visit | Attending: Cardiovascular Disease | Admitting: Cardiovascular Disease

## 2020-05-30 DIAGNOSIS — R0989 Other specified symptoms and signs involving the circulatory and respiratory systems: Secondary | ICD-10-CM | POA: Diagnosis not present

## 2020-05-30 DIAGNOSIS — I8390 Asymptomatic varicose veins of unspecified lower extremity: Secondary | ICD-10-CM | POA: Diagnosis not present

## 2020-06-01 ENCOUNTER — Encounter: Payer: Medicare Other | Admitting: Physical Therapy

## 2020-06-11 ENCOUNTER — Encounter (HOSPITAL_COMMUNITY): Payer: Medicare Other

## 2020-06-15 ENCOUNTER — Encounter: Payer: Medicare Other | Admitting: Physical Therapy

## 2020-06-21 ENCOUNTER — Other Ambulatory Visit: Payer: Self-pay

## 2020-06-21 DIAGNOSIS — I8390 Asymptomatic varicose veins of unspecified lower extremity: Secondary | ICD-10-CM

## 2020-06-22 ENCOUNTER — Encounter: Payer: Medicare Other | Admitting: Physical Therapy

## 2020-06-29 ENCOUNTER — Encounter: Payer: Medicare Other | Admitting: Physical Therapy

## 2020-07-02 ENCOUNTER — Ambulatory Visit (INDEPENDENT_AMBULATORY_CARE_PROVIDER_SITE_OTHER): Payer: Medicare Other | Admitting: Neurology

## 2020-07-02 ENCOUNTER — Other Ambulatory Visit: Payer: Self-pay

## 2020-07-02 ENCOUNTER — Encounter: Payer: Self-pay | Admitting: Neurology

## 2020-07-02 VITALS — BP 189/94 | HR 73 | Ht 61.0 in | Wt 116.8 lb

## 2020-07-02 DIAGNOSIS — G43109 Migraine with aura, not intractable, without status migrainosus: Secondary | ICD-10-CM

## 2020-07-02 DIAGNOSIS — G4733 Obstructive sleep apnea (adult) (pediatric): Secondary | ICD-10-CM | POA: Diagnosis not present

## 2020-07-02 NOTE — Patient Instructions (Signed)
  1. Start taking the following vitamins and supplements:  Magnesium citrate 415m daily, riboflavin (B2) 4010mdaily and Coenzyme Q 10 30092maily. 2. Agree with physical therapy for neck, shoulder and upper back pain 3. Limit use of pain relievers to no more than 2 days out of the week.  These medications include acetaminophen, NSAIDs (ibuprofen/Advil/Motrin, naproxen/Aleve, triptans (Imitrex/sumatriptan), Excedrin, and narcotics.  This will help reduce risk of rebound headaches. 4. Be aware of common food triggers:  - Caffeine:  coffee, black tea, cola, Mt. Dew  - Chocolate  - Dairy:  aged cheeses (brie, blue, cheddar, gouda, Par[place]rovolone, romManillawiss, etc), chocolate milk, buttermilk, sour cream, limit eggs and yogurt  - Nuts, peanut butter  - Alcohol  - Cereals/grains:  FRESH breads (fresh bagels, sourdough, doughnuts), yeast productions  - Processed/canned/aged/cured meats (pre-packaged deli meats, hotdogs)  - MSG/glutamate:  soy sauce, flavor enhancer, pickled/preserved/marinated foods  - Sweeteners:  aspartame (Equal, Nutrasweet).  Sugar and Splenda are okay  - Vegetables:  legumes (lima beans, lentils, snow peas, fava beans, pinto peans, peas, garbanzo beans), sauerkraut, onions, olives, pickles  - Fruit:  avocados, bananas, citrus fruit (orange, lemon, grapefruit), mango  - Other:  Frozen meals, macaroni and cheese 5. Routine exercise 6. Stay adequately hydrated (aim for 64 oz water daily) 7. Keep headache diary 8. Maintain proper stress management 9. Maintain proper sleep hygiene 10. Do not skip meals 11. Consider following up with another sleep specialist.  I recommend [facility].  Contact me if you would like a referral 12. Follow up 4 to 6 months.

## 2020-07-02 NOTE — Progress Notes (Signed)
NEUROLOGY CONSULTATION NOTE  [name] MRN: [id number] DOB: September 19, 1932  Referring provider: Martinique Demarco, OD Primary care provider: Lawerance Cruel, MD  Reason for consult:  migraine  HISTORY OF PRESENT ILLNESS: Janet Nguyen is an 86 year old female with atrial flutter, SVT, sleep apnea, pulmonary HTN, scoliosis and fibromyalgia who presents for migraine with aura.  History supplemented by prior neurologist's and referring provider's notes.  Sleep apnea and migraines.  Few years ago sleep apnea.  Unable to use CPAP.  Wakes up exhausted.  Sore throat.  Now recalls dreams.  Feels chest pressure.    Suddenly can't see, if watching a screen sees bilateral cartoon characters eyes closed of if staring at wall or floor, there will be movement.  Takes Tylenol and sits with calm app in chair and close eyes and relax and within 30 minutes resolve left posterior headache.  Photophobia and phonophobia.  No nausea, vomiting,  Occurs 2 to 3 times a day or without for a week.    Recommended Botox or CGRP inhibitor.  Didn't think it will help.    She has had migraine with aura since young adulthood.  She will develop visual aura in both eyes that affect her vision, either flashes, or she may see cartoon characters.  If she looks at a wall or floor, she may see  movement.  With onset of aura, she will develop a left posterior headache with associated neck pain and photophobia but no nausea or vomiting.  It lasts about 30 minutes.  Frequency varies from several days in a row to 2 weeks without headache.  No specific triggers.  Taking Tylenol and sitting to rest helps relieve it.  MRI of brain with and without contrast on 11/07/2015 personally reviewed showed mild chronic small vessel ischemic changes but otherwise unremarkable.  She also has sleep apnea but is unable to tolerate CPAP.  She does wake up fatigued.  She has chronic neck and back pain with scoliosis.  Current NSAIDS:   none Current analgesics:  Tylenol Current triptans:  none Current ergotamine:  none Current anti-emetic:  none Current muscle relaxants:  none Current anti-anxiolytic:  alprazolam Current sleep aide:  none Current Antihypertensive medications:  metoprolol Current Antidepressant medications:  none Current Anticonvulsant medications:  none Current anti-CGRP:  none Current Vitamins/Herbal/Supplements:  none Current Antihistamines/Decongestants:  none Other therapy:  Will be starting physical therapy for neck and upper back. Hormone/birth control:  none  Past NSAIDS:  meloxicam Past analgesics:  tramadol Past abortive triptans:  none Past abortive ergotamine:  none Past muscle relaxants:  none Past anti-emetic:  Zofran ODT 10m Past antihypertensive medications:  amlodipine Past antidepressant medications:  Nortriptyline, Lexapro Past anticonvulsant medications:  none Past anti-CGRP:  none Past vitamins/Herbal/Supplements:  none Past antihistamines/decongestants:  none Other past therapies:  none  Drinks tea daily.   PAST MEDICAL HISTORY: Past Medical History:  Diagnosis Date  . Arrhythmia    History of SVT with documented PVC'S and  PAC'S  12/08/12 Nuc stress test normal LV EF 74%  Event Monitor  12/01/12-01/03/13  . [place]   . Celiac disease    treated by Dr. MEarlean Shawl . GERD (gastroesophageal reflux disease)   . Intervertebral disc stenosis of neural canal of cervical region   . Irregular heart beat 11/30/12   ECHO-EF 60-65%  . Osteoporosis   . PMR (polymyalgia rheumatica) (HCC)    Dr. BMarijean Bravo pt states she was diagnosed 10-15 years ago, not treated  at this time or any issues that she is aware of.  . Scoliosis   . Scoliosis   . Sleep apnea 10/02/11 [facility]   Sleep study AHI -total sleep 10.3/hr  64.0/ hr during REM sleep.RDI 22.8/hr during total sleep 64.0/hr during REM sleep The lowest O2 sat during Non-REM and REM sleep was 86% and 88%  respectively. 04/08/12 CPAP/BIPAP titration study [facility]    PAST SURGICAL HISTORY: Past Surgical History:  Procedure Laterality Date  . APPENDECTOMY     ruptured at age 75 and had surgery  . CARDIAC CATHETERIZATION  01/27/06  . cataract surgery  2015   Dr. Herbert Deaner; March & April 2015    MEDICATIONS: Current Outpatient Medications on File Prior to Visit  Medication Sig Dispense Refill  . ALPRAZolam (XANAX) 0.5 MG tablet Take 0.5 mg by mouth as needed.    Marland Kitchen amLODipine (NORVASC) 5 MG tablet TAKE ONE TABLET BY MOUTH DAILY 90 tablet 3  . clidinium-chlordiazePOXIDE (LIBRAX) 5-2.5 MG capsule Take 1 capsule by mouth as needed.    Noelle Penner ACETAMINOPHEN PO Take 500 mg by mouth as needed (pain).    Marland Kitchen FLUTICASONE PROPIONATE, NASAL, NA Place into the nose daily as needed (rhinitis).    . Lactobacillus (DIGESTIVE HEALTH PROBIOTIC PO) Take by mouth every other day. (Patient not taking: Reported on 04/20/2020)    . metoprolol succinate (TOPROL-XL) 50 MG 24 hr tablet TAKE 1 AND 1/2 TABLETS BY MOUTH TWO TIMES A DAY 270 tablet 1  . metoprolol tartrate (LOPRESSOR) 25 MG tablet Take 25 mg by mouth. Uses rarely as needed for atrial flutters    . Multiple Vitamins-Minerals (PRESERVISION AREDS 2 PO) Take 1 tablet by mouth every other day.    . pantoprazole (PROTONIX) 20 MG tablet Take 20 mg by mouth daily.     Marland Kitchen Phenylephrine-APAP-guaiFENesin (EQ SINUS CONGESTION & PAIN PO) Take by mouth as needed (for sinus pain). Contains acetaminophen 325 mg and Phenylephrine 5 mg    . Polyvinyl Alcohol-Povidone (REFRESH OP) Apply to eye at bedtime.     Marland Kitchen UNABLE TO FIND Take by mouth. Med Name: [place] WOMEN'S GUMMIES ONCE-TWICE DAILY    . UNABLE TO FIND Med Name: VITAFUSION CALCIUM PLUS D CHEWABLES, ONCE-TWICE DAILY    . UNABLE TO FIND Med Name: VITAFUSION OMEGA-3, ONCE-TWICE DAILY    . UNABLE TO FIND 1,000 mcg daily. Med Name: [place] BIOTIN    . UNABLE TO FIND 2 (two) times daily. Med  Name: EQUATE FIBER POWDER/MIRALAX    . UNABLE TO FIND as needed (itching ears). Med Name: FLUOCINONIDE TOPICAL SOLUTION (DROPS)    . ZOLPIDEM TARTRATE ER PO Take 2.5 mg by mouth at bedtime. (Patient not taking: Reported on 04/20/2020)     No current facility-administered medications on file prior to visit.    ALLERGIES: Allergies  Allergen Reactions  . Augmentin [Amoxicillin-Pot Clavulanate]     Has never had, son allergic   . Gluten Meal     Unknown  . Naproxen     Stomach upset  . Codeine Nausea Only    FAMILY HISTORY: Family History  Problem Relation Age of Onset  . Breast cancer Mother   . Heart disease Father   . Migraines Neg Hx    SOCIAL HISTORY: Social History   Socioeconomic History  . Marital status: Divorced    Spouse name: Not on file  . Number of children: 4  . Years of education: college   .  Highest education level: Not on file  Occupational History  . Occupation: retired   Tobacco Use  . Smoking status: Never Smoker  . Smokeless tobacco: Never Used  Vaping Use  . Vaping Use: Never used  Substance and Sexual Activity  . Alcohol use: Yes    Alcohol/week: 1.0 standard drink    Types: 1 Standard drinks or equivalent per week    Comment: drink ETOH socially  . Drug use: No  . Sexual activity: Not on file  Other Topics Concern  . Not on file  Social History Narrative   Lives alone at home   Drinks tea but tries to drink decaf   Has 9 grandchildren    Right handed   Social Determinants of Health   Financial Resource Strain:   . Difficulty of Paying Living Expenses: Not on file  Food Insecurity:   . Worried About Charity fundraiser in the Last Year: Not on file  . Ran Out of Food in the Last Year: Not on file  Transportation Needs:   . Lack of Transportation (Medical): Not on file  . Lack of Transportation (Non-Medical): Not on file  Physical Activity:   . Days of Exercise per Week: Not on file  . Minutes of Exercise per Session: Not on file   Stress:   . Feeling of Stress : Not on file  Social Connections:   . Frequency of Communication with Friends and Family: Not on file  . Frequency of Social Gatherings with Friends and Family: Not on file  . Attends Religious Services: Not on file  . Active Member of Clubs or Organizations: Not on file  . Attends Archivist Meetings: Not on file  . Marital Status: Not on file  Intimate Partner Violence:   . Fear of Current or Ex-Partner: Not on file  . Emotionally Abused: Not on file  . Physically Abused: Not on file  . Sexually Abused: Not on file    PHYSICAL EXAM: Blood pressure (!) 189/94, pulse 73, height 5' 1"  (1.549 m), weight 116 lb 12.8 oz (53 kg), SpO2 96 %. General: No acute distress.  Patient appears well-groomed.   Head:  Normocephalic/atraumatic Eyes:  fundi examined but not visualized Neck: supple, no paraspinal tenderness, full range of motion Back: No paraspinal tenderness Heart: regular rate and rhythm Lungs: Clear to auscultation bilaterally. Vascular: No carotid bruits. Neurological Exam: Mental status: alert and oriented to person, place, and time, recent and remote memory intact, fund of knowledge intact, attention and concentration intact, speech fluent and not dysarthric, language intact. Cranial nerves: CN I: not tested CN II: pupils equal, round and reactive to light, visual fields intact CN III, IV, VI:  full range of motion, no nystagmus, no ptosis CN V: facial sensation intact CN VII: upper and lower face symmetric CN VIII: hearing intact CN IX, X: gag intact, uvula midline CN XI: sternocleidomastoid and trapezius muscles intact CN XII: tongue midline Bulk & Tone: normal, no fasciculations. Motor:  5/5 throughout  Sensation:  temperature and vibration sensation intact.   Deep Tendon Reflexes:  2+ throughout, toes downgoing.   Finger to nose testing:  Without dysmetria.   Heel to shin:  Without dysmetria.   Gait:  Normal station and  stride. Romberg negative  IMPRESSION: Migraine with aura, without status migrainosus, not intractable Sleep apnea  PLAN: She would like to avoid pharmacologic management.  Biofeedback not an option as it is not covered by insurance. 1.  She will  try magnesium citrate 459m, riboflavin 4060mand CoQ10 30065maily 2.  Going to start PT for neck, shoulder an upper back pain 3.  Discussed risk of rebound headaches. 4.  Recommended following up with sleep specialist.  Defers referral for now. 5.  Follow up in 4 to 6 months.  Thank you for allowing me to take part in the care of this patient.  AdaMetta ClinesO  CC: C. AlaMelinda CrutchD  JorMartiniquemarco, OD

## 2020-07-03 DIAGNOSIS — R3915 Urgency of urination: Secondary | ICD-10-CM | POA: Diagnosis not present

## 2020-07-03 DIAGNOSIS — R350 Frequency of micturition: Secondary | ICD-10-CM | POA: Diagnosis not present

## 2020-07-03 DIAGNOSIS — N762 Acute vulvitis: Secondary | ICD-10-CM | POA: Diagnosis not present

## 2020-07-06 ENCOUNTER — Ambulatory Visit (INDEPENDENT_AMBULATORY_CARE_PROVIDER_SITE_OTHER): Payer: Medicare Other | Admitting: Physician Assistant

## 2020-07-06 ENCOUNTER — Ambulatory Visit (HOSPITAL_COMMUNITY)
Admission: RE | Admit: 2020-07-06 | Discharge: 2020-07-06 | Disposition: A | Discharge status: Home / Self Care | Payer: Medicare Other | Source: Ambulatory Visit | Attending: Vascular Surgery | Admitting: Vascular Surgery

## 2020-07-06 ENCOUNTER — Other Ambulatory Visit: Payer: Self-pay

## 2020-07-06 VITALS — BP 110/69 | HR 59 | Temp 98.2°F | Resp 20 | Ht 61.0 in | Wt 116.9 lb

## 2020-07-06 DIAGNOSIS — I8390 Asymptomatic varicose veins of unspecified lower extremity: Secondary | ICD-10-CM

## 2020-07-06 DIAGNOSIS — I872 Venous insufficiency (chronic) (peripheral): Secondary | ICD-10-CM | POA: Diagnosis not present

## 2020-07-06 NOTE — Progress Notes (Signed)
[facility]   Reason for referral: Swollen right>left leg  History of Present Illness  Janet Nguyen is a 86 y.o. female who presents with chief complaint: swollen leg.  Patient notes, onset of swelling varicose veins right > left LE  Which started after child birth x 4, associated with unknown causes.  The patient has had no a  history of DVT, positive history of varicose vein, no history of venous stasis ulcers, no history of  Lymphedema and no history of skin changes in lower legs.  There is a family history of venous disorders.  The patient has  used compression stockings in the past.  She denise frank edema in B LE.    Past Medical History:  Diagnosis Date  . Arrhythmia    History of SVT with documented PVC'S and  PAC'S  12/08/12 Nuc stress test normal LV EF 74%  Event Monitor  12/01/12-01/03/13  . Atrial flutter ([place])   . Celiac disease    treated by Dr. [name]  . GERD (gastroesophageal reflux disease)   . Intervertebral disc stenosis of neural canal of cervical region   . Irregular heart beat 11/30/12   ECHO-EF 60-65%  . Osteoporosis   . PMR (polymyalgia rheumatica) (HCC)    Dr. Marijean Bravo; pt states she was diagnosed 10-15 years ago, not treated at this time or any issues that she is aware of.  . Scoliosis   . Scoliosis   . Sleep apnea 10/02/11 [facility]   Sleep study AHI -total sleep 10.3/hr  64.0/ hr during REM sleep.RDI 22.8/hr during total sleep 64.0/hr during REM sleep The lowest O2 sat during Non-REM and REM sleep was 86% and 88% respectively. 04/08/12 [facility]    Past Surgical History:  Procedure Laterality Date  . APPENDECTOMY     ruptured at age 29 and had surgery  . CARDIAC CATHETERIZATION  01/27/06  . cataract surgery  2015   Dr. Herbert Deaner; March & April 2015    Social History   Socioeconomic History  . Marital status: Divorced    Spouse name: Not on file  . Number  of children: 4  . Years of education: college   . Highest education level: Not on file  Occupational History  . Occupation: retired   Tobacco Use  . Smoking status: Never Smoker  . Smokeless tobacco: Never Used  Vaping Use  . Vaping Use: Never used  Substance and Sexual Activity  . Alcohol use: Yes    Alcohol/week: 1.0 standard drink    Types: 1 Standard drinks or equivalent per week    Comment: drink ETOH socially  . Drug use: No  . Sexual activity: Not on file  Other Topics Concern  . Not on file  Social History Narrative   Lives alone at home   Drinks tea but tries to drink decaf   Has 9 grandchildren    Right handed   Social Determinants of Health   Financial Resource Strain:   . Difficulty of Paying Living Expenses: Not on file  Food Insecurity:   . Worried About Charity fundraiser in the Last Year: Not on file  . Ran Out of Food in the Last Year: Not on file  Transportation Needs:   . Lack of Transportation (Medical): Not on file  . Lack of Transportation (Non-Medical): Not on file  Physical Activity:   . Days of Exercise per Week: Not on file  .  Minutes of Exercise per Session: Not on file  Stress:   . Feeling of Stress : Not on file  Social Connections:   . Frequency of Communication with Friends and Family: Not on file  . Frequency of Social Gatherings with Friends and Family: Not on file  . Attends Religious Services: Not on file  . Active Member of Clubs or Organizations: Not on file  . Attends Archivist Meetings: Not on file  . Marital Status: Not on file  Intimate Partner Violence:   . Fear of Current or Ex-Partner: Not on file  . Emotionally Abused: Not on file  . Physically Abused: Not on file  . Sexually Abused: Not on file    Family History  Problem Relation Age of Onset  . Breast cancer Mother   . Heart disease Father   . Migraines Neg Hx     Current Outpatient Medications on File Prior to Visit  Medication Sig Dispense  Refill  . ALPRAZolam (XANAX) 0.5 MG tablet Take 0.5 mg by mouth as needed.    Marland Kitchen amLODipine (NORVASC) 5 MG tablet TAKE ONE TABLET BY MOUTH DAILY 90 tablet 3  . clidinium-chlordiazePOXIDE (LIBRAX) 5-2.5 MG capsule Take 1 capsule by mouth as needed. (Patient not taking: Reported on 07/02/2020)    . EQ ACETAMINOPHEN PO Take 500 mg by mouth as needed (pain).    Marland Kitchen FLUTICASONE PROPIONATE, NASAL, NA Place into the nose daily as needed (rhinitis).    . Lactobacillus (DIGESTIVE HEALTH PROBIOTIC PO) Take by mouth every other day. (Patient not taking: Reported on 04/20/2020)    . metoprolol succinate (TOPROL-XL) 50 MG 24 hr tablet TAKE 1 AND 1/2 TABLETS BY MOUTH TWO TIMES A DAY 270 tablet 1  . metoprolol tartrate (LOPRESSOR) 25 MG tablet Take 25 mg by mouth. Uses rarely as needed for atrial flutters    . Multiple Vitamins-Minerals (PRESERVISION AREDS 2 PO) Take 1 tablet by mouth every other day. (Patient not taking: Reported on 07/02/2020)    . pantoprazole (PROTONIX) 20 MG tablet Take 20 mg by mouth daily. Once or twice a day    . Phenylephrine-APAP-guaiFENesin (EQ SINUS CONGESTION & PAIN PO) Take by mouth as needed (for sinus pain). Contains acetaminophen 325 mg and Phenylephrine 5 mg    . Polyvinyl Alcohol-Povidone (REFRESH OP) Apply to eye at bedtime.  (Patient not taking: Reported on 07/02/2020)    . UNABLE TO FIND Take by mouth. Med Name: [place] WOMEN'S GUMMIES ONCE-TWICE DAILY    . UNABLE TO FIND Med Name: VITAFUSION CALCIUM PLUS D CHEWABLES, ONCE-TWICE DAILY    . UNABLE TO FIND Med Name: VITAFUSION OMEGA-3, ONCE-TWICE DAILY    . UNABLE TO FIND 1,000 mcg daily. Med Name: [place] BIOTIN    . UNABLE TO FIND 2 (two) times daily. Med Name: EQUATE FIBER POWDER/MIRALAX    . UNABLE TO FIND as needed (itching ears). Med Name: FLUOCINONIDE TOPICAL SOLUTION (DROPS)    . ZOLPIDEM TARTRATE ER PO Take 2.5 mg by mouth at bedtime. (Patient not taking: Reported on 04/20/2020)     No current  facility-administered medications on file prior to visit.    Allergies as of 07/06/2020 - Review Complete 07/06/2020  Allergen Reaction Noted  . Gluten meal  11/29/2012  . Naproxen  10/17/2019  . Codeine Nausea Only 07/12/2012     ROS:   General:  No weight loss, Fever, chills  HEENT: No recent headaches, no nasal bleeding, no visual changes, no sore throat  Neurologic: No  dizziness, blackouts, seizures. No recent symptoms of stroke or mini- stroke. No recent episodes of slurred speech, or temporary blindness.  Cardiac: No recent episodes of chest pain/pressure, no shortness of breath at rest.  No shortness of breath with exertion.  Denies history of atrial fibrillation or irregular heartbeat  Vascular: No history of rest pain in feet.  No history of claudication.  No history of non-healing ulcer, No history of DVT   Pulmonary: No home oxygen, no productive cough, no hemoptysis,  No asthma or wheezing [x]  sleep apnea  Musculoskeletal:  [ ]  Arthritis, [ ]  Low back pain,  [x ] Joint pain  Hematologic:No history of hypercoagulable state.  No history of easy bleeding.  No history of anemia  Gastrointestinal: No hematochezia or melena,  No gastroesophageal reflux, no trouble swallowing  Urinary: [ ]  chronic Kidney disease, [ ]  on HD - [ ]  MWF or [ ]  TTHS, [ ]  Burning with urination, [ ]  Frequent urination, [ ]  Difficulty urinating;   Skin: No rashes  Psychological: No history of anxiety,  No history of depression  Physical Examination  Vitals:   07/06/20 1342  BP: 110/69  Pulse: (!) 59  Resp: 20  Temp: 98.2 F (36.8 C)  SpO2: 99%  Weight: 116 lb 14.4 oz (53 kg)  Height: 5' 1"  (1.549 m)    Body mass index is 22.09 kg/m.  General:  Alert and oriented, no acute distress HEENT: Normal Neck: No bruit or JVD Pulmonary: Clear to auscultation bilaterally Cardiac: Regular Rate and Rhythm without murmur Abdomen: Soft, non-tender, non-distended, no mass, no scars Skin: No  rash Extremity Pulses:  2+ radial, brachial, femoral, dorsalis pedis, posterior tibial pulses bilaterally Musculoskeletal: No deformity or edema  Neurologic: Upper and lower extremity motor 5/5 and symmetric          DATA:   Venous Reflux Times  +--------------+---------+------+-----------+------------+-----------------  ----+  RIGHT     Reflux NoRefluxReflux TimeDiameter cmsComments                      Yes                          +--------------+---------+------+-----------+------------+-----------------  ----+  CFV      no                                +--------------+---------+------+-----------+------------+-----------------  ----+  FV mid    no                                +--------------+---------+------+-----------+------------+-----------------  ----+  GSV at SFJ        yes         0.67               +--------------+---------+------+-----------+------------+-----------------  ----+  GSV prox thighno               0.45               +--------------+---------+------+-----------+------------+-----------------  ----+  GSV mid thigh no               0.38               +--------------+---------+------+-----------+------------+-----------------  ----+  GSV dist thigh      yes         0.36               +--------------+---------+------+-----------+------------+-----------------  ----+  GSV at knee        yes         0.32               +--------------+---------+------+-----------+------------+-----------------  ----+  GSV prox calf       yes         0.32  Several  varicosities                              seen  off of GSV  in                               calf           +--------------+---------+------+-----------+------------+-----------------  ----+  GSV mid calf       yes                          +--------------+---------+------+-----------+------------+-----------------  ----+  SSV Pop Fossa no               0.36               +--------------+---------+------+-----------+------------+-----------------  ----+  SSV prox calf no               0.18               +--------------+---------+------+-----------+------------+-----------------  ----+  SSV mid calf no               0.21               +--------------+---------+------+-----------+------------+-----------------  ----+   Summary:  Right:  - No evidence of deep vein thrombosis seen in the right lower extremity,  from the common femoral through the popliteal veins.  - No evidence of superficial venous thrombosis in the right lower  extremity.  - Venous reflux is noted in the right sapheno-femoral junction.  - Venous reflux is noted in the right greater saphenous vein in the thigh.  - Venous reflux is noted in the right greater saphenous vein in the calf.    Assessment: Right LE venous reflux with varicose veins  She has SFJ reflux with GSV size >0.4 cm GSV reflux in the thigh and calf. No DVT or superficial thrombosis.    She denise frank LE edema.  No history of bleeding episodes, skin changes or non healing wounds.  She has palpable pedal pulses and is not at risk for limb loss.     Plan: She does not feel like she can don and doff compression hose.  She just wanted reassurance.  She will wear compression panty hose and elevation while at rest.  She will f/u as needed in the future.  She states at her age she does not want any intervention at her age.     Roxy Horseman PA-C [facility] Office: [phone]  MD on Call [place]

## 2020-07-13 ENCOUNTER — Ambulatory Visit: Payer: Medicare Other | Attending: Family Medicine | Admitting: Physical Therapy

## 2020-07-13 ENCOUNTER — Encounter: Payer: Self-pay | Admitting: Physical Therapy

## 2020-07-13 ENCOUNTER — Other Ambulatory Visit: Payer: Self-pay

## 2020-07-13 DIAGNOSIS — M542 Cervicalgia: Secondary | ICD-10-CM | POA: Insufficient documentation

## 2020-07-13 DIAGNOSIS — M6281 Muscle weakness (generalized): Secondary | ICD-10-CM | POA: Insufficient documentation

## 2020-07-13 DIAGNOSIS — R293 Abnormal posture: Secondary | ICD-10-CM | POA: Diagnosis not present

## 2020-07-13 DIAGNOSIS — M4125 Other idiopathic scoliosis, thoracolumbar region: Secondary | ICD-10-CM | POA: Insufficient documentation

## 2020-07-13 NOTE — Therapy (Signed)
[facility] [address] [place], [place], [address] Phone: [phone]   Fax:  [phone]  Physical Therapy Evaluation  Patient Details  Name: Janet Nguyen MRN: [id number] Date of Birth: 05-26-1932 Referring Provider (PT): Lawerance Cruel, MD   Encounter Date: 07/13/2020   PT End of Session - 07/13/20 1201    Visit Number 1    Date for PT Re-Evaluation 10/05/20    Authorization Type Medicare    Progress Note Due on Visit 10    PT Start Time 1017    PT Stop Time 1100    PT Time Calculation (min) 43 min    Activity Tolerance Patient tolerated treatment well    Behavior During Therapy [facility] for tasks assessed/performed           Past Medical History:  Diagnosis Date  . Arrhythmia    History of SVT with documented PVC'S and  PAC'S  12/08/12 Nuc stress test normal LV EF 74%  Event Monitor  12/01/12-01/03/13  . Atrial flutter ([place])   . Celiac disease    treated by Dr. [name]  . GERD (gastroesophageal reflux disease)   . Intervertebral disc stenosis of neural canal of cervical region   . Irregular heart beat 11/30/12   ECHO-EF 60-65%  . Osteoporosis   . PMR (polymyalgia rheumatica) (HCC)    Dr. Marijean Bravo; pt states she was diagnosed 10-15 years ago, not treated at this time or any issues that she is aware of.  . Scoliosis   . Scoliosis   . Sleep apnea 10/02/11 [facility]   Sleep study AHI -total sleep 10.3/hr  64.0/ hr during REM sleep.RDI 22.8/hr during total sleep 64.0/hr during REM sleep The lowest O2 sat during Non-REM and REM sleep was 86% and 88% respectively. 04/08/12 CPAP/BIPAP titration study [facility]    Past Surgical History:  Procedure Laterality Date  . APPENDECTOMY     ruptured at age 26 and had surgery  . CARDIAC CATHETERIZATION  01/27/06  . cataract surgery  2015   Dr. Herbert Deaner; March & April 2015    There were no vitals filed for this visit.     Subjective Assessment - 07/13/20 1018    Subjective Pt with chronic neck pain that is especially bad today.  Neck always hurts.  It really hurts to try to reach to fix my hair.  I sleep on my back or Rt side.  I elevate my head of bed.    Pertinent History scoliosis, sleep apnea (no CPAP)    Limitations House hold activities;Lifting    Patient Stated Goals pain relief    Currently in Pain? Yes    Pain Score 10-Worst pain ever    Pain Location Neck    Pain Orientation Posterior;Left;Right    Pain Type Chronic pain    Pain Radiating Towards throat and up to hairline, throughout spine Rt low back mostly, gets optic migraines    Pain Onset More than a month ago    Pain Frequency Constant    Aggravating Factors  bending, gardening, household activities    Pain Relieving Factors meds, heat              [facility] PT Assessment - 07/13/20 0001      Assessment   Medical Diagnosis M54.2 (ICD-10-CM) - Neck pain    Referring Provider (PT) Lawerance Cruel, MD    Onset Date/Surgical Date --   years  Next MD Visit no    Prior Therapy yes       Precautions   Precautions Other (comment)    Precaution Comments signif scoliosis needs care in positioning for comfort      Restrictions   Weight Bearing Restrictions No      Balance Screen   Has the patient fallen in the past 6 months No      [place] residence      Prior Function   Level of [place] Retired      Associate Professor   Overall Cognitive Status Within Functional Limits for tasks assessed      Posture/Postural Control   Posture/Postural Control Postural limitations    Postural Limitations Rounded Shoulders;Forward head;Flexed trunk    Posture Comments signif scoliosis      ROM / Strength   AROM / PROM / Strength AROM;Strength      AROM   AROM Assessment Site Cervical;Shoulder    Right/Left Shoulder Left    Left Shoulder Flexion 70 Degrees   pain   Left Shoulder  External Rotation --   pain with functional reach back of head   Cervical Flexion 40    Cervical Extension 35    Cervical - Right Side Bend 12    Cervical - Left Side Bend 18    Cervical - Right Rotation 40    Cervical - Left Rotation 25      Strength   Overall Strength Comments bil UEs 4/5    Strength Assessment Site Cervical    Cervical Flexion 4-/5    Cervical Extension 4/5    Cervical - Right Side Bend 4/5    Cervical - Left Side Bend 4/5    Cervical - Right Rotation 4/5    Cervical - Left Rotation 4/5      Palpation   Palpation comment Lt>Rt upper traps, levator, cervical paraspinals, pectorals, SOs, infraspinatus                      Objective measurements completed on examination: See above findings.                 PT Short Term Goals - 07/13/20 1209      PT SHORT TERM GOAL #1   Title Pt will be ind with initial HEP    Baseline -    Time 3    Period Weeks    Status New    Target Date 08/03/20      PT SHORT TERM GOAL #2   Title Pt will be instructed in spinal decompression series for pain relief and bone support/protection.    Time 3    Period Weeks    Status New    Target Date 08/03/20      PT SHORT TERM GOAL #3   Title Pt will report 20% reduction in neck pain during the day.    Time 4    Period Weeks    Status New    Target Date 08/10/20             PT Long Term Goals - 07/13/20 1210      PT LONG TERM GOAL #1   Title Pt will improve neck rotation to at least 45 deg bil for improved visibility with driving.    Baseline -    Time 12    Period Weeks    Status New    Target Date  10/05/20      PT LONG TERM GOAL #2   Title Pt will be able to reach back of head with Lt UE with min pain for improved tolerance of hair washing and styling.    Baseline -    Time 12    Period Weeks    Status New    Target Date 10/05/20      PT LONG TERM GOAL #3   Title Pt will report at least 50% less neck pain with use of HEP, home  modalities, postural self-checks and actiivty modification as tools for pain control.    Baseline -    Time 12    Period Weeks    Status New    Target Date 10/05/20      PT LONG TERM GOAL #4   Title Reduce FOTO to </= 44% to demo less limitation    Baseline 48%    Time 12    Period Weeks    Status New    Target Date 10/05/20                  Plan - 07/13/20 1201    Clinical Impression Statement Pt is a pleasant 86yo returning Pt with diagnosis of neck pain.  She has chronic neck and back pain related to severe degenerative scoliosis.  Pt also has osteoporosis.  She has signif postural restrictions including rounded thoracic spine, shoulders and forward head.  She has signif limitations in cervical A/ROM and end range restrictions with pain in Lt shoulder.  Strength in cervical spine and UEs ranges from 4-/5 to 4/5 throughout.  She has tightness and tenderness in bil upper quadrants including pectorals, SOs, upper traps, levator, and rhomboids.  She gets relief with seated light manual traction, manual pectoral stretching for more upright posture, and STM of bil upper quadrants.  She wishes for some pain relief as her primary goal.  She will benefit from skilled PT and postural ther ex to address her pain and limiations.    Personal Factors and Comorbidities Age;Time since onset of injury/illness/exacerbation    Examination-Activity Limitations Lift;Reach Overhead;Bend;Hygiene/Grooming    Examination-Participation Restrictions Yard Work;Meal Prep;Cleaning;Community Activity;Laundry;Shop    Stability/Clinical Decision Making Stable/Uncomplicated    Clinical Decision Making Low    Rehab Potential Good    PT Frequency 2x / week    PT Duration 12 weeks    PT Treatment/Interventions ADLs/Self Care Home Management;Joint Manipulations;Spinal Manipulations;Manual techniques;Neuromuscular re-education;Therapeutic exercise;Therapeutic activities;Functional mobility training;Moist  Heat;Electrical Stimulation;Passive range of motion;Traction    PT Next Visit Plan manual STM bil upper quadrants from chair or with careful positioning on table for comfort, spinal decompression intro as tol, give postural cue ther ex to start [place] start next time    Consulted and Agree with Plan of Care Patient           Patient will benefit from skilled therapeutic intervention in order to improve the following deficits and impairments:  Postural dysfunction, Decreased strength, Decreased mobility, Impaired flexibility, Hypomobility, Pain, Increased muscle spasms, Increased fascial restricitons, Decreased range of motion  Visit Diagnosis: Cervicalgia - Plan: PT plan of care cert/re-cert  Muscle weakness (generalized) - Plan: PT plan of care cert/re-cert  Other idiopathic scoliosis, thoracolumbar region - Plan: PT plan of care cert/re-cert  Abnormal posture - Plan: PT plan of care cert/re-cert     Problem List Patient Active Problem List   Diagnosis Date Noted  . Deviated septum 05/08/2020  .  Mass of subcutaneous tissue 05/02/2020  . Vertigo 06/16/2019  . Pulmonary hypertension, unspecified ([place]) 04/14/2019  . Ischemic colitis ([place]) 04/14/2019  . Migraine with aura and without status migrainosus, not intractable 11/08/2018  . Degeneration of lumbar intervertebral disc 10/14/2018  . Hoarseness of voice 03/04/2018  . Abdominal pain 07/01/2017  . Diverticulitis, colon   . Metabolic acidosis, increased anion gap   . Constipation 04/14/2017  . Lumbar hernia 04/14/2017  . Presbycusis of both ears 01/10/2017  . Tinnitus aurium, bilateral 01/10/2017  . Gastroesophageal reflux disease 08/20/2016  . Hemoptysis 08/20/2016  . Obstructive sleep apnea of adult 08/20/2016  . Rhinitis, chronic 08/20/2016  . Throat pain in adult 08/20/2016  . Fatigue 12/23/2015  . Sciatica of right side 10/06/2015  . History of migraine headaches 10/06/2015  . Frequent  PVCs 12/28/2013  . Premature atrial contractions 12/28/2013  . PSVT (paroxysmal supraventricular tachycardia) ([place]) 12/28/2013  . Heart palpitations 07/13/2013  . Sleep apnea 04/11/2013  . Scoliosis 04/11/2013  . Atrial flutter with rapid ventricular response ([place]) 11/29/2012  . Chest pain, atypical 11/29/2012  . Fibromyalgia syndrome 11/29/2012  . Chronic steroid use 11/29/2012    Baruch Merl, PT 07/13/20 12:16 PM   [facility] [address], [place], [place], [address] Phone: [phone]   Fax:  [phone]  Name: Janet Nguyen MRN: [id number] Date of Birth: 02-04-1932

## 2020-07-17 ENCOUNTER — Ambulatory Visit: Payer: Medicare Other | Admitting: Physical Therapy

## 2020-07-17 ENCOUNTER — Other Ambulatory Visit: Payer: Self-pay

## 2020-07-17 ENCOUNTER — Ambulatory Visit (INDEPENDENT_AMBULATORY_CARE_PROVIDER_SITE_OTHER): Payer: Medicare Other | Admitting: Cardiovascular Disease

## 2020-07-17 ENCOUNTER — Encounter: Payer: Self-pay | Admitting: Physical Therapy

## 2020-07-17 ENCOUNTER — Encounter: Payer: Self-pay | Admitting: Cardiovascular Disease

## 2020-07-17 VITALS — BP 128/52 | HR 67 | Ht 61.0 in | Wt 117.6 lb

## 2020-07-17 DIAGNOSIS — M4125 Other idiopathic scoliosis, thoracolumbar region: Secondary | ICD-10-CM

## 2020-07-17 DIAGNOSIS — I471 Supraventricular tachycardia: Secondary | ICD-10-CM

## 2020-07-17 DIAGNOSIS — I272 Pulmonary hypertension, unspecified: Secondary | ICD-10-CM | POA: Diagnosis not present

## 2020-07-17 DIAGNOSIS — I872 Venous insufficiency (chronic) (peripheral): Secondary | ICD-10-CM | POA: Diagnosis not present

## 2020-07-17 DIAGNOSIS — M6281 Muscle weakness (generalized): Secondary | ICD-10-CM

## 2020-07-17 DIAGNOSIS — R293 Abnormal posture: Secondary | ICD-10-CM

## 2020-07-17 DIAGNOSIS — I10 Essential (primary) hypertension: Secondary | ICD-10-CM | POA: Diagnosis not present

## 2020-07-17 DIAGNOSIS — M542 Cervicalgia: Secondary | ICD-10-CM | POA: Diagnosis not present

## 2020-07-17 DIAGNOSIS — G4733 Obstructive sleep apnea (adult) (pediatric): Secondary | ICD-10-CM

## 2020-07-17 DIAGNOSIS — G43109 Migraine with aura, not intractable, without status migrainosus: Secondary | ICD-10-CM

## 2020-07-17 NOTE — Patient Instructions (Signed)
Medication Instructions:  No changes *If you need a refill on your cardiac medications before your next appointment, please call your pharmacy*   Lab Work: None ordered If you have labs (blood work) drawn today and your tests are completely normal, you will receive your results only by: Marland Kitchen MyChart Message (if you have MyChart) OR . A paper copy in the mail If you have any lab test that is abnormal or we need to change your treatment, we will call you to review the results.   Testing/Procedures: None ordered   Follow-Up: At [facility], you and your health needs are our priority.  As part of our continuing mission to provide you with exceptional heart care, we have created designated Provider Care Teams.  These Care Teams include your primary Cardiologist (physician) and Advanced Practice Providers (APPs -  Physician Assistants and Nurse Practitioners) who all work together to provide you with the care you need, when you need it.  We recommend signing up for the patient portal called "MyChart".  Sign up information is provided on this After Visit Summary.  MyChart is used to connect with patients for Virtual Visits (Telemedicine).  Patients are able to view lab/test results, encounter notes, upcoming appointments, etc.  Non-urgent messages can be sent to your provider as well.   To learn more about what you can do with MyChart, go to [email or web address].    Your next appointment:   6 month(s)  The format for your next appointment:   In Person  Provider:   Shelva Majestic, MD   Other Instructions None

## 2020-07-17 NOTE — Progress Notes (Signed)
50,000 patient ID: Janet Nguyen, female   DOB: [birth year], 86 y.o.   MRN: [id number]     Primary  M.D.: Dr. Mardene Sayer  HPI: Janet Nguyen is a 86 y.o. female who presents for a 6  month followup cardiology evaluation.   Janet Nguyen has a history of documented SVT and also has PACs and PVCs  treated with beta blocker therapy. In April 2014 I further titrated her beta blocker therapy after cardiac event monitor revealed several bursts of recurrent SVT up to 177 beats per minute in March.  In July after she had had 2 episodes of chest fluttering which each lasted over 30 minutes which he did take metoprolol tartrate with relief I recommended further titration of her Toprol to 75 mg in the morning and 50 mg at night and if necessary she could further titrate this to 75 twice a day.  She has a history of obstructive sleep apnea but despite multiple attempts at CPAP utilization she has not been able to tolerate this. She was referred to Dr. [name] and has a customized dental appliance with mandibular advancement with improvement in some of her symptomatology. Due concern that her teeth may be moving in more recently she has has not been using customized appliance daily but has been using her old non-customized mouthguard.  At times shen has awakened abruptly from a dream with her heart pounding and a sensation of hot flashes, gasping for breath.   She also states that her scoliosis is getting worse. She does have left-sided musculoskeletal type chest pain due to her spine angulation.  Beause of her significant scoliosis, she feels she must sleep on her back.   She has a history of GERD for which she has been taking Nexium.  She presents for evaluation.  I had scheduled her for an overnight oximetry to see if she is a candidate for supplemental oxygen at nighttime since she refused to use CPAP and only very rarely uses her customized mouthpiece.  She does wear a mouthguard to reduce  bruxism.  Oximetry study was performed overnight on February 23/24.  Her mean oxygen saturation was 93.12%.  She spent 12 minutes and 16 seconds with O2 sat duration below 88% with the lowest O2 sat duration of 80%. I tried to set her up for supplemental oxygen at bedtime.  However, she has been denied for this on multiple occasions by her insurance/Medicare.  She feels that she is sleeping better.  She can only sleep in her back due to scoliosis.  Her left side is concave; her right side is convex.  She has had issues with labile blood pressure. She has had issues with lower back discomfort and sciatica leading to emergency room evaluation in November 2016.  She saw Dr. [name] for neurosurgical evaluation. She has migraine headaches. She  Recently saw Dr. [name] for these migraine headaches.  She has been on Toprol-XL 75 mg twice a day for her palpitations and presently denies any awareness an extra heartbeats. She takes Xanax on an as-needed basis for anxiety.  She has been taking Nexium 20, no grams every other day for GERD. She states her scoliosis is getting worse.  Has been experiencing more fatigue.  She also notes some occasional left hand numbness.  In a mouth guard but no ureteral longer uses her mandibular advancement device and not tolerate CPAP therapy for her obstructive sleep apnea.    She had experienced left-sided chest discomfort which most  likely was related to her significant scoliosis the potential neuropathy causing intermittent left arm and hand numbness.  I slightly reduced her Toprol-XL which she had been taking 150 mg daily and a 75 twice a day regimen to 75 and milligrams in the morning and 50 mg with ultimate plan to decrease this to 50 twice a day.  She states when she reduce this, she began to notice more optical migraines and resume taking the higher dose Toprol at 75 twice a day with improvement.  She has seen Dr. [name] for her paresthesias.  She admits to significant anxiety.   She had requested zolpidem for sleep initiation and maintenance.  In the past, we had tried 6.25 slow-release version to help with sleep maintenance, but due to cost issues preferred the 5 mg sleep initiation dose.  She experiences optical migraine headaches.  She was being seen by neurologist, Dr. Melton Alar  She continues to have issues with her scoliosis causing discomfort in her neck and arm due to her distortion.  He tells me she underwent endoscopy and colonoscopy as well as banding of hemorrhoids.  She had a benign polyp removed.  She has issues with spastic colon.  Her sleep is better with zolpiden 5 mg.  She saw Kerin Ransom on 11/13/2016.  She had noticed that her hair was "thinning" and was concerned about this being the result of Toprol.  He suggested possibly decreasing Toprol but she preferred not until she had seen me.  Since that time, she continues to experience some optical migraines.  She has noticed some leg left neck discomfort.  She denies any patches of hair loss.  She denies differential arm weakness.  She continues to have difficulty with her scoliosis with hip pain and rib cage discomfort.   When I saw her in March 2018, there was a blood pressure differential of 118/78 in the left arm and 140/80 in the right arm.  She subsequently underwent upper extremity Doppler evaluation on 12/25/2016.  She was noted have mild heterogeneous plaque with stable 1-39% bilateral internal carotid stenoses.  She had normal subclavian arteries bilaterally.  There are patent vertebral arteries with antegrade flow.  She did not have any significant blood pressure differential and had triphasic waveforms in both her right and biphasic in her left brachial artery.  Left blood pressure was 8 mm higher than the right brachial pressure.  At times, she continues to experience some vague chest wall symptoms, which most likely related to her posture.  She denies significant shortness of breath.  She has to sleep on  her back because of her scoliosis.  Uses a chin strap to prevent oral breathing.  She is not on CPAP.   In June 2019 she had concerns about some of her medications possibly causing hair loss or memory loss.  She admits to having dry eyes.  She continues to have difficulty from her scolWsaw her in November 2019 at which time she,  felt well from a cardiac standphk and apparently had a new oral appliance made.  She used this initially but then started to notice some teeth discomfort.  Apparently this again was treated by Dr.e still admits to being tired.  She has issues with her abdomen being tight which she believes is related to her scoliosis.  Her GERD is controlled with pantoprazole.    She was seen by Dr. Mardene Sayer for her primary care.  She had developed a fungal infection of her toenail.  He  had given her to [place] fine 20 mg.  She has not started this and was hesitant to do this due to potential interaction with metoprolol.  She also was evaluated by Dr. Brett Fairy.  According to Janet Nguyen she did not evaluate her ophthalmic migraines.  However she was planning to do a possible home study to reassess her sleep apnea.  In the past she did not tolerate any CPAP therapy and although initially tolerated customized oral appliance this seemed to cause difficulty with movement of her teeth.  She has been using the CALM app on her smart phone to help with relaxation and improve sleep and is chronically dependent on low-dose zolpidem prescribed by Dr. Harrington Challenger.  She denies chest pain.  Her palpitations are well controlled with her current dose of metoprolol 75 mils twice a day and she can to be on low-dose amlodipine 2.5 mg.    Since I last saw her in June 2020, she underwent the home study by Dr. Theodoro Clock was done after several studies with no data.  According to Dr. Edwena Felty note, AHI was 26.5 which was moderate and during REM sleep AHI rose to 34.9.  Snoring was not recorded.  Apparently there was  discussion of possible Financial planner.  Janet Nguyen is not interested.  I last saw her in October 2020 at which time she had continued issues with migraines and some vertigo for which he takes the Epley maneuver.  She continued to have discomfort related to her scoliosis with muscle discomfort on her chest.  She believes she is waking up during dreaming and not sleeping well.  Her blood pressure has increased.  At her prior evaluation, I had instituted low-dose amlodipine at 2.5 mg.  With her blood pressure elevated during that evaluation I further titrated this to 5 mg I recommended she continue taking Toprol XL 75 mg twice a day.  She did not have any episodes of palpitations or recurrent SVT.  I  saw her in January 2021. She had seen  Dr. Jaynee Eagles of neurology.  She continued to experience some muscular neck aches which may be related to her scoliosis but do not sound ischemic in etiology.  She states she has some varicose veins in the right lower extremity and complains of trivial ankle swelling.  She denies palpitations.    I last saw her in April 2021.  Over the previous several months she had felt well from a cardiovascular standpoint.  She continues to have issues with her scoliosis causing back issues as well as some left-sided discomfort along her rib cage.   She has sleep apnea and remotely did not tolerate CPAP or an oral appliance  She continues to be on amlodipine 5 mg daily, metoprolol succinate 75 mg twice a day both for blood pressure and her palpitations. GERD is fairly well controlled on pantoprazole. She continues to be on Librax which she states does offer improvement and she takes alprazolam on a as needed basis.  Since I last saw her, she has undergone neurology evaluation by Dr. Tomi Likens and has had migraine issues with aura since young adulthood.  An MRI of the brain in 2017 showed mild chronic small vessel ischemic changes otherwise was unremarkable.  She had developed swelling in her  leg right greater than left and has varicose veins.  She underwent lower extremity venous study which revealed no DVT but there was evidence for venous reflux in the right saphenofemoral junction, right greater saphenous vein in the thigh  and right greater saphenous vein in the calf.  She has used compression stockings in the past.  There was no plan for intervention.  She has been undergoing physical therapy.  There is no change in her intermittent rib discomfort from her scoliosis.  She denies palpitations.  She presents for reevaluation.  Past Medical History:  Diagnosis Date   Arrhythmia    History of SVT with documented PVC'S and  PAC'S  12/08/12 Nuc stress test normal LV EF 74%  Event Monitor  12/01/12-01/03/13   Atrial flutter ([place])    Celiac disease    treated by Dr. [name]   GERD (gastroesophageal reflux disease)    Intervertebral disc stenosis of neural canal of cervical region    Irregular heart beat 11/30/12   ECHO-EF 60-65%   Osteoporosis    PMR (polymyalgia rheumatica) (HCC)    Dr. Marijean Bravo; pt states she was diagnosed 10-15 years ago, not treated at this time or any issues that she is aware of.   Scoliosis    Scoliosis    Sleep apnea 10/02/11 [facility]   Sleep study AHI -total sleep 10.3/hr  64.0/ hr during REM sleep.RDI 22.8/hr during total sleep 64.0/hr during REM sleep The lowest O2 sat during Non-REM and REM sleep was 86% and 88% respectively. 04/08/12 CPAP/BIPAP titration study [facility]    Past Surgical History:  Procedure Laterality Date   APPENDECTOMY     ruptured at age 89 and had surgery   CARDIAC CATHETERIZATION  01/27/06   cataract surgery  2015   Dr. Herbert Deaner; March & April 2015    Allergies  Allergen Reactions   Gluten Meal     Unknown   Naproxen     Stomach upset   Codeine Nausea Only    Current Outpatient Medications  Medication Sig Dispense Refill   ALPRAZolam (XANAX) 0.5 MG tablet Take 0.5 mg  by mouth as needed.     amLODipine (NORVASC) 5 MG tablet TAKE ONE TABLET BY MOUTH DAILY 90 tablet 3   clidinium-chlordiazePOXIDE (LIBRAX) 5-2.5 MG capsule Take 1 capsule by mouth as needed.      EQ ACETAMINOPHEN PO Take 500 mg by mouth as needed (pain).     FLUTICASONE PROPIONATE, NASAL, NA Place into the nose daily as needed (rhinitis).     Lactobacillus (DIGESTIVE HEALTH PROBIOTIC PO) Take by mouth every other day.      metoprolol succinate (TOPROL-XL) 50 MG 24 hr tablet TAKE 1 AND 1/2 TABLETS BY MOUTH TWO TIMES A DAY 270 tablet 1   metoprolol tartrate (LOPRESSOR) 25 MG tablet Take 25 mg by mouth. Uses rarely as needed for atrial flutters     Multiple Vitamins-Minerals (PRESERVISION AREDS 2 PO) Take 1 tablet by mouth every other day.      pantoprazole (PROTONIX) 20 MG tablet Take 20 mg by mouth daily. Once or twice a day     Phenylephrine-APAP-guaiFENesin (EQ SINUS CONGESTION & PAIN PO) Take by mouth as needed (for sinus pain). Contains acetaminophen 325 mg and Phenylephrine 5 mg     Polyvinyl Alcohol-Povidone (REFRESH OP) Apply to eye at bedtime.      UNABLE TO FIND Take by mouth. Med Name: VITAFUSION WOMEN'S [place] Med [place]: [place] TO FIND Med Name: VITAFUSION OMEGA-3, ONCE-TWICE DAILY     UNABLE TO FIND 1,000 mcg daily. Med Name: [place] BIOTIN  UNABLE TO FIND 2 (two) times daily. Med Name: EQUATE FIBER POWDER/MIRALAX     UNABLE TO FIND as needed (itching ears). Med Name: FLUOCINONIDE TOPICAL SOLUTION (DROPS)     ZOLPIDEM TARTRATE ER PO Take 2.5 mg by mouth at bedtime.      No current facility-administered medications for this visit.    Socially she is divorced has 4 children 9 grandchildren. She does exercise. No tobacco use. She does occasional wine.  ROS General: Negative; No fevers, chills, or night sweats;  HEENT: Negative; No changes in vision or hearing,  sinus congestion, difficulty swallowing Pulmonary: Negative; No cough, wheezing, shortness of breath, hemoptysis Cardiovascular: Positive for occasional chest wall pain and nocturnal palpitations GI: Negative; No nausea, vomiting, diarrhea, or abdominal pain GU: Negative; No dysuria, hematuria, or difficulty voiding Musculoskeletal: Positive for significant scoliosis; fibromyalgia;  joint pain, or weakness Hematologic/Oncology: Negative; no easy bruising, bleeding Endocrine: Negative; no heat/cold intolerance; no diabetes Neuro: History of migraine headaches Skin: Positive for fungal infection of her toenail Psychiatric: Negative; No behavioral problems, depression Sleep: Positive for sleep apnea ; No snoring, daytime sleepiness, hypersomnolence, bruxism, restless legs, hypnogognic hallucinations, no cataplexy Other comprehensive 14 point system review is negative.  PE BP (!) 128/52    Pulse 67    Ht 5' 1"  (1.549 m)    Wt 117 lb 9.6 oz (53.3 kg)    SpO2 96%    BMI 22.22 kg/m    Repeat blood pressure was 122/60  Wt Readings from Last 3 Encounters:  07/17/20 117 lb 9.6 oz (53.3 kg)  07/06/20 116 lb 14.4 oz (53 kg)  07/02/20 116 lb 12.8 oz (53 kg)   General: Alert, oriented, no distress.  Skin: normal turgor, no rashes, warm and dry HEENT: Normocephalic, atraumatic. Pupils equal round and reactive to light; sclera anicteric; extraocular muscles intact;  Nose without nasal septal hypertrophy Mouth/Parynx benign; Mallinpatti scale 3 Neck: No JVD, no carotid bruits; normal carotid upstroke Lungs: clear to ausculatation and percussion; no wheezing or rales Chest wall: without tenderness to palpitation Heart: PMI not displaced, RRR, s1 s2 normal, 1/6 systolic murmur, no diastolic murmur, no rubs, gallops, thrills, or heaves Abdomen: soft, nontender; no hepatosplenomehaly, BS+; abdominal aorta nontender and not dilated by palpation. Back: Kyphoscoliosis; no CVA tenderness Pulses  2+ Musculoskeletal: full range of motion, normal strength, no joint deformities Extremities: no clubbing cyanosis or edema, Homan's sign negative  Neurologic: grossly nonfocal; Cranial nerves grossly wnl Psychologic: Normal mood and affect   ECG (independently read by me): Sinus rhythm at 67 with mild arrythmia; norma intervals  April 2021 ECG (independently read by me): Normal sinus rhythm at 69 bpm. No ectopy. Normal intervals.  October 18, 2019 ECG (independently read by me): Sinus bradycardia 59 bpm.  QS complex V1 V2.  Normal intervals.  No ectopy  October 2020 ECG (independently read by me): Normal sinus rhythm at 63 bpm.  QS complex V1 V2.  No ectopy.  Normal intervals.  June 2020 ECG (independently read by me): NSR at 62; no ectopy; normal intervals   November 2019 ECG (independently read by me): Normal sinus rhythm with PAC.  Ventricular rate 66.  QS V1 V2.  Mild T wave abnormality  June 2019 ECG (independently read by me): Normal sinus rhythm at 64 bpm.  Normal intervals.  No ectopy.  September 2018 ECG (independently read by me): Normal sinus rhythm at 64 bpm.  Isolated PAC.  QTc interval 414 ms.  QS V1 V2, unchanged.  May  2018 ECG (independently read by me): Normal sinus rhythm at 65 bpm.  QS V1, V2.  Normal intervals.  No ST segment changes.  March 2018 ECG (independently read by me): Normal sinus rhythm at 65 bpm.  No ectopy.  Normal intervals.  September 2017 ECG (independently read by me): Normal sinus rhythm at 62 bpm.  QS V1 and V2.  Normal intervals.  April 2017 ECG (independently read by me): Normal sinus rhythm at 70 bpm.  No ectopy.  PR interval 158 ms and QTc interval 414 ms.  March 2017 ECG (independently read by me): normal sinus rhythm at 61 bpm..  No ST segment changes.  Normal intervals.  QTc interval 396 ms.  August 2014 ECG (independently read by me): Normal sinus rhythm at 63 bpm.  QRS couplets V1 V2.  No cigarette ST-T changes.  May 2016 ECG  (independently read by me): Sinus bradycardia 59 bpm.  No ectopy.  February 2016 ECG (independently read by me): Sinus bradycardia 56 bpm.  Normal intervals.  Prior ECG (independently read by me): Normal sinus rhythm at 62 beats per minute.  QTc interval 411 milliseconds.  QRS complex V1, V2.  Normal intervals.  ECG (independently read by me): Normal sinus rhythm at 62 beats per minute. Normal intervals. QTc interval 399 ms.  Prior ECG of 07/13/2013: Sinus rhythm at 61 beats per minute. QTc interval 406 ms. PR interval normal at 168 ms.  LABS: BMP Latest Ref Rng & Units 12/05/2017 07/02/2017 07/01/2017  Glucose 65 - 99 mg/dL 129(H) 88 102(H)  BUN 6 - 20 mg/dL 13 9 13   Creatinine 0.44 - 1.00 mg/dL 0.87 0.95 0.82  Sodium 135 - 145 mmol/L 136 139 134(L)  Potassium 3.5 - 5.1 mmol/L 3.8 3.8 3.6  Chloride 101 - 111 mmol/L 100(L) 109 101  CO2 22 - 32 mmol/L 26 24 24   Calcium 8.9 - 10.3 mg/dL 9.6 8.7(L) 9.3   Hepatic Function Latest Ref Rng & Units 12/05/2017 07/01/2017 06/30/2017  Total Protein 6.5 - 8.1 g/dL 7.6 7.2 8.2(H)  Albumin 3.5 - 5.0 g/dL 4.4 4.4 5.0  AST 15 - 41 U/L 30 28 40  ALT 14 - 54 U/L 18 16 17   Alk Phosphatase 38 - 126 U/L 55 40 46  Total Bilirubin 0.3 - 1.2 mg/dL 0.6 1.3(H) 1.4(H)  Bilirubin, Direct 0.1 - 0.5 mg/dL - 0.1 -   CBC Latest Ref Rng & Units 04/20/2020 12/05/2017 07/02/2017  WBC 4.0 - 10.5 K/uL 8.7 9.2 10.1  Hemoglobin 12.0 - 15.0 g/dL 12.4 12.0 11.2(L)  Hematocrit 36 - 46 % 37.9 36.0 32.9(L)  Platelets 150 - 400 K/uL 267 288 224   Lab Results  Component Value Date   TSH 1.07 12/03/2016  Lipid Panel     Component Value Date/Time   CHOL 154 12/03/2016 0845   TRIG 81 12/03/2016 0845   HDL 67 12/03/2016 0845   CHOLHDL 2.3 12/03/2016 0845   VLDL 16 12/03/2016 0845   LDLCALC 71 12/03/2016 0845     RADIOLOGY: No results found.  IMPRESSION:  No diagnosis found.  ASSESSMENT AND PLAN: Janet Nguyen is an 86 year old female who has a history of documented SVT  and PACs/PVCs which have been controlled with beta blocker therapy. On her echo in March 2014 she had normal systolic function with grade 1 diastolic dysfunction and had  significant left atrial dilatation and mild dilatation of the right ventricle with moderate dilatation of the right atrium. She had mild/moderate pulmonary hypertension with  PA pressures of 42 mm. Her echo in October 2018 showed an EF of 55 to 60% with grade 1 diastolic dysfunction, mitral annular calcification with mild MR, and mild pulmonary hypertension with PA pressure 34 mm. Her left atrium was mild to moderately dilated.  Presently she denies any recurrent tachypalpitations and is tolerating metoprolol succinate 75 mg twice a day and has a prescription for tartrate to take as needed.  Her sleep apnea is untreated and she did not tolerate CPAP and she stopped using an oral appliance due to shifting of her teeth.  She has lower extremity varicosities and venous reflux disease involving her right greater saphenous vein in the thigh and calf and has support stockings but it is difficult for placement.  She continues to have some issues related to her significant scoliosis causing some restriction to the left side of her chest and abdomen.  I have recommended she continue her current therapy.  She has a history of migraine headache and recently had seen Dr. Metta Clines of neurology.  She received her Covid vaccinations.  She prefers to see me at 69-monthintervals and I will see her then or sooner if problems evolve.   TTroy Sine MD, FGastrodiagnostics A Medical Group Dba [facility] 07/17/2020 9:53 AM

## 2020-07-17 NOTE — Therapy (Signed)
[facility] [address], [place], [place], [address] Phone: [phone]   Fax:  [phone]  Physical Therapy Treatment  Patient Details  Name: Janet Nguyen MRN: [id number] Date of Birth: 15-Jan-1932 Referring Provider (PT): Lawerance Cruel, MD   Encounter Date: 07/17/2020   PT End of Session - 07/17/20 1708    Visit Number 2    Date for PT Re-Evaluation 10/05/20    Authorization Type Medicare - NEEDS KX MODIFIER    Progress Note Due on Visit 10    PT Start Time 1535    PT Stop Time 1615    PT Time Calculation (min) 40 min    Activity Tolerance Patient tolerated treatment well    Behavior During Therapy [facility] for tasks assessed/performed           Past Medical History:  Diagnosis Date  . Arrhythmia    History of SVT with documented PVC'S and  PAC'S  12/08/12 Nuc stress test normal LV EF 74%  Event Monitor  12/01/12-01/03/13  . Atrial flutter ([place])   . Celiac disease    treated by Dr. Earlean Nguyen  . GERD (gastroesophageal reflux disease)   . Intervertebral disc stenosis of neural canal of cervical region   . Irregular heart beat 11/30/12   ECHO-EF 60-65%  . Osteoporosis   . PMR (polymyalgia rheumatica) (HCC)    Dr. Marijean Nguyen; pt states she was diagnosed 10-15 years ago, not treated at this time or any issues that she is aware of.  . Scoliosis   . Scoliosis   . Sleep apnea 10/02/11 [facility]   Sleep study AHI -total sleep 10.3/hr  64.0/ hr during REM sleep.RDI 22.8/hr during total sleep 64.0/hr during REM sleep The lowest O2 sat during Non-REM and REM sleep was 86% and 88% respectively. 04/08/12 CPAP/BIPAP titration study [facility]    Past Surgical History:  Procedure Laterality Date  . APPENDECTOMY     ruptured at age 86 and had surgery  . CARDIAC CATHETERIZATION  01/27/06  . cataract surgery  2015   Dr. Herbert Deaner; March & April 2015    There were no vitals filed for  this visit.   Subjective Assessment - 07/17/20 1535    Subjective I am awful.  I am a 10/10 for pain.  I have been having digestive issues too.    Pertinent History scoliosis, sleep apnea (no CPAP)    Limitations House hold activities;Lifting    Patient Stated Goals pain relief    Currently in Pain? Yes    Pain Score 10-Worst pain ever    Pain Location Neck    Pain Orientation Left;Right;Posterior    Pain Type Chronic pain    Pain Radiating Towards Lt ribage wraps around front to back    Pain Onset More than a month ago    Pain Frequency Constant                             [facility] Adult PT Treatment/Exercise - 07/17/20 0001      Exercises   Exercises Lumbar;Neck;Shoulder      Lumbar Exercises: Seated   Other Seated Lumbar Exercises trunk Rt SB with Lt UE overhead reach, breathe into tightness 3x10      Lumbar Exercises: Supine   Other Supine Lumbar Exercises thoracic extension over 1/2 foam roll seated x 10      Shoulder Exercises: Seated  Other Seated Exercises scap squeeze bil around 3lb ball seated in chair 10x3 sec holds      Shoulder Exercises: Standing   Other Standing Exercises wall slide stretch bil x 20 sec each      Shoulder Exercises: Stretch   Other Shoulder Stretches doorway slide Lt UE, Lt foot back, hold elongation x 30 sec      Manual Therapy   Manual Therapy Soft tissue mobilization;Myofascial release    Soft tissue mobilization Lt QL, Lt lats, bil: paraspinals, bil upper traps, SOs, scalenes, levator scap    Myofascial Release Lt diaphragm and latissimus dorsi in Rt SL with active elongation of UE reach and LE reach                    PT Short Term Goals - 07/13/20 1209      PT SHORT TERM GOAL #1   Title Pt will be ind with initial HEP    Baseline -    Time 3    Period Weeks    Status New    Target Date 08/03/20      PT SHORT TERM GOAL #2   Title Pt will be instructed in spinal decompression series for pain relief  and bone support/protection.    Time 3    Period Weeks    Status New    Target Date 08/03/20      PT SHORT TERM GOAL #3   Title Pt will report 20% reduction in neck pain during the day.    Time 4    Period Weeks    Status New    Target Date 08/10/20             PT Long Term Goals - 07/13/20 1210      PT LONG TERM GOAL #1   Title Pt will improve neck rotation to at least 45 deg bil for improved visibility with driving.    Baseline -    Time 12    Period Weeks    Status New    Target Date 10/05/20      PT LONG TERM GOAL #2   Title Pt will be able to reach back of head with Lt UE with min pain for improved tolerance of hair washing and styling.    Baseline -    Time 12    Period Weeks    Status New    Target Date 10/05/20      PT LONG TERM GOAL #3   Title Pt will report at least 50% less neck pain with use of HEP, home modalities, postural self-checks and actiivty modification as tools for pain control.    Baseline -    Time 12    Period Weeks    Status New    Target Date 10/05/20      PT LONG TERM GOAL #4   Title Reduce FOTO to </= 44% to demo less limitation    Baseline 48%    Time 12    Period Weeks    Status New    Target Date 10/05/20                 Plan - 07/17/20 1708    Clinical Impression Statement Pt arrived with 10/10 pain in neck, back and abdominals.  She has significant compression and crowding between Lt inferior aspect of ribcage and Lt hemipelvis secondary to significant scoliosis.  She greatly benefits from myofascial release with active stretching for elongation and  STM along spine and ribcage.  Her pain was reduced to 5/10 end of session following Lt diaphragm release and STM to elongate Lt trunk.  Seated and standing ther ex was used for improved thoracic extension.  Pt "felt inches taller" end of session and was so relieved of her high pain level.  She will continue to benefit from skilled PT along POC for pain and postural release  strategies.    Rehab Potential Good    PT Frequency 2x / week    PT Duration 12 weeks    PT Treatment/Interventions ADLs/Self Care Home Management;Joint Manipulations;Spinal Manipulations;Manual techniques;Neuromuscular re-education;Therapeutic exercise;Therapeutic activities;Functional mobility training;Moist Heat;Electrical Stimulation;Passive range of motion;Traction    PT Next Visit Plan continue postural elongation, try seated neck retraction and manual traction seated    PT Home Exercise Plan gave verbal instructions confirming her ongoing HEP    Consulted and Agree with Plan of Care Patient           Patient will benefit from skilled therapeutic intervention in order to improve the following deficits and impairments:     Visit Diagnosis: Cervicalgia  Muscle weakness (generalized)  Other idiopathic scoliosis, thoracolumbar region  Abnormal posture     Problem List Patient Active Problem List   Diagnosis Date Noted  . Deviated septum 05/08/2020  . Mass of subcutaneous tissue 05/02/2020  . Vertigo 06/16/2019  . Pulmonary hypertension, unspecified ([place]) 04/14/2019  . Ischemic colitis ([place]) 04/14/2019  . Migraine with aura and without status migrainosus, not intractable 11/08/2018  . Degeneration of lumbar intervertebral disc 10/14/2018  . Hoarseness of voice 03/04/2018  . Abdominal pain 07/01/2017  . Diverticulitis, colon   . Metabolic acidosis, increased anion gap   . Constipation 04/14/2017  . Lumbar hernia 04/14/2017  . Presbycusis of both ears 01/10/2017  . Tinnitus aurium, bilateral 01/10/2017  . Gastroesophageal reflux disease 08/20/2016  . Hemoptysis 08/20/2016  . Obstructive sleep apnea of adult 08/20/2016  . Rhinitis, chronic 08/20/2016  . Throat pain in adult 08/20/2016  . Fatigue 12/23/2015  . Sciatica of right side 10/06/2015  . History of migraine headaches 10/06/2015  . Frequent PVCs 12/28/2013  . Premature atrial contractions 12/28/2013  . PSVT  (paroxysmal supraventricular tachycardia) ([place]) 12/28/2013  . Heart palpitations 07/13/2013  . Sleep apnea 04/11/2013  . Scoliosis 04/11/2013  . Atrial flutter with rapid ventricular response ([place]) 11/29/2012  . Chest pain, atypical 11/29/2012  . Fibromyalgia syndrome 11/29/2012  . Chronic steroid use 11/29/2012    Baruch Merl, PT 07/17/20 5:17 PM   [facility] [address] [place], [place], [address] Phone: [phone]   Fax:  [phone]  Name: KERRIN MARKMAN MRN: [id number] Date of Birth: 11-21-1931

## 2020-07-18 DIAGNOSIS — Z23 Encounter for immunization: Secondary | ICD-10-CM | POA: Diagnosis not present

## 2020-07-20 DIAGNOSIS — H524 Presbyopia: Secondary | ICD-10-CM | POA: Diagnosis not present

## 2020-07-20 DIAGNOSIS — H04123 Dry eye syndrome of bilateral lacrimal glands: Secondary | ICD-10-CM | POA: Diagnosis not present

## 2020-07-20 DIAGNOSIS — H538 Other visual disturbances: Secondary | ICD-10-CM | POA: Diagnosis not present

## 2020-07-22 ENCOUNTER — Encounter: Payer: Self-pay | Admitting: Cardiovascular Disease

## 2020-07-26 ENCOUNTER — Ambulatory Visit: Payer: Medicare Other | Admitting: Neurology

## 2020-07-27 ENCOUNTER — Encounter: Payer: Medicare Other | Admitting: Physical Therapy

## 2020-08-08 DIAGNOSIS — H938X9 Other specified disorders of ear, unspecified ear: Secondary | ICD-10-CM | POA: Diagnosis not present

## 2020-08-08 DIAGNOSIS — B370 Candidal stomatitis: Secondary | ICD-10-CM | POA: Diagnosis not present

## 2020-08-08 DIAGNOSIS — K589 Irritable bowel syndrome without diarrhea: Secondary | ICD-10-CM | POA: Diagnosis not present

## 2020-08-08 DIAGNOSIS — R198 Other specified symptoms and signs involving the digestive system and abdomen: Secondary | ICD-10-CM | POA: Diagnosis not present

## 2020-08-08 DIAGNOSIS — R059 Cough, unspecified: Secondary | ICD-10-CM | POA: Diagnosis not present

## 2020-08-08 DIAGNOSIS — Z03818 Encounter for observation for suspected exposure to other biological agents ruled out: Secondary | ICD-10-CM | POA: Diagnosis not present

## 2020-08-08 DIAGNOSIS — R0789 Other chest pain: Secondary | ICD-10-CM | POA: Diagnosis not present

## 2020-08-10 DIAGNOSIS — R42 Dizziness and giddiness: Secondary | ICD-10-CM | POA: Diagnosis not present

## 2020-08-10 DIAGNOSIS — J342 Deviated nasal septum: Secondary | ICD-10-CM | POA: Diagnosis not present

## 2020-08-13 ENCOUNTER — Encounter: Payer: Self-pay | Admitting: Physical Therapy

## 2020-08-13 ENCOUNTER — Other Ambulatory Visit: Payer: Self-pay

## 2020-08-13 ENCOUNTER — Ambulatory Visit: Payer: Medicare Other | Attending: Family Medicine | Admitting: Physical Therapy

## 2020-08-13 DIAGNOSIS — M4125 Other idiopathic scoliosis, thoracolumbar region: Secondary | ICD-10-CM | POA: Insufficient documentation

## 2020-08-13 DIAGNOSIS — M545 Low back pain, unspecified: Secondary | ICD-10-CM | POA: Diagnosis not present

## 2020-08-13 DIAGNOSIS — M542 Cervicalgia: Secondary | ICD-10-CM | POA: Diagnosis not present

## 2020-08-13 DIAGNOSIS — R293 Abnormal posture: Secondary | ICD-10-CM | POA: Insufficient documentation

## 2020-08-13 DIAGNOSIS — M62830 Muscle spasm of back: Secondary | ICD-10-CM | POA: Diagnosis not present

## 2020-08-13 DIAGNOSIS — G8929 Other chronic pain: Secondary | ICD-10-CM | POA: Diagnosis not present

## 2020-08-13 DIAGNOSIS — M6281 Muscle weakness (generalized): Secondary | ICD-10-CM | POA: Diagnosis not present

## 2020-08-13 NOTE — Patient Instructions (Signed)
°  Access Code: [email or web address] URL: [email or web address] Date: 08/13/2020 Prepared by: Venetia Night Ruchi Stoney  Exercises Standing Row with Anchored Resistance - 3 x daily - 7 x weekly - 1 sets - 15 reps

## 2020-08-13 NOTE — Therapy (Signed)
[facility] [address], [place], [place], [address] Phone: [phone]   Fax:  [phone]  Physical Therapy Treatment  Patient Details  Name: Janet Nguyen MRN: [id number] Date of Birth: 1931-10-25 Referring Provider (PT): Lawerance Cruel, MD   Encounter Date: 08/13/2020   PT End of Session - 08/13/20 1024    Visit Number 3    Date for PT Re-Evaluation 10/05/20    Authorization Type Medicare - NEEDS KX MODIFIER    Progress Note Due on Visit 10    PT Start Time 1019    PT Stop Time 1103    PT Time Calculation (min) 44 min    Activity Tolerance Patient tolerated treatment well    Behavior During Therapy [facility] for tasks assessed/performed           Past Medical History:  Diagnosis Date   Arrhythmia    History of SVT with documented PVC'S and  PAC'S  12/08/12 Nuc stress test normal LV EF 74%  Event Monitor  12/01/12-01/03/13   Atrial flutter ([place])    Celiac disease    treated by Dr. [name]   GERD (gastroesophageal reflux disease)    Intervertebral disc stenosis of neural canal of cervical region    Irregular heart beat 11/30/12   ECHO-EF 60-65%   Osteoporosis    PMR (polymyalgia rheumatica) (HCC)    Dr. Marijean Bravo; pt states she was diagnosed 10-15 years ago, not treated at this time or any issues that she is aware of.   Scoliosis    Scoliosis    Sleep apnea 10/02/11 [facility]   Sleep study AHI -total sleep 10.3/hr  64.0/ hr during REM sleep.RDI 22.8/hr during total sleep 64.0/hr during REM sleep The lowest O2 sat during Non-REM and REM sleep was 86% and 88% respectively. 04/08/12 CPAP/BIPAP titration study [facility]    Past Surgical History:  Procedure Laterality Date   APPENDECTOMY     ruptured at age 79 and had surgery   CARDIAC CATHETERIZATION  01/27/06   cataract surgery  2015   Dr. Herbert Deaner; March & April 2015    There were no vitals filed for  this visit.   Subjective Assessment - 08/13/20 1021    Subjective I continue to have stomach upset and pain.    Pertinent History scoliosis, sleep apnea (no CPAP)    Limitations House hold activities;Lifting    Patient Stated Goals pain relief    Currently in Pain? Yes    Pain Score 9     Pain Location Back    Pain Orientation Left;Posterior;Anterior    Pain Type Chronic pain    Pain Onset More than a month ago    Pain Frequency Constant    Aggravating Factors  all activity    Pain Relieving Factors meds, heat                             [facility] Adult PT Treatment/Exercise - 08/13/20 0001      Lumbar Exercises: Sidelying   Other Sidelying Lumbar Exercises Lt arm and leg long reach for Lt QL elongation 2x15      Shoulder Exercises: Seated   Other Seated Exercises backward shoulder rolls x 10, then shrug/retract/depress 5x3 sec holds each    Other Seated Exercises sit tall over 1/2 foam roller across t-spine 5x5 sec      Shoulder Exercises: Standing  Row Strengthening;Both;Theraband;10 reps    Theraband Level (Shoulder Row) Level 1 (Yellow)    Row Limitations bil, then Rt, then Lt, repeat cycle x 10       Shoulder Exercises: ROM/Strengthening   Other ROM/Strengthening Exercises seated retraction around 3lb ball 5 reps x 5 sec      Manual Therapy   Manual Therapy Soft tissue mobilization    Soft tissue mobilization bil upper quadrants seated, cervical paraspinals seated, Lt QL and lumbar paraspinals in Rt SL                    PT Short Term Goals - 07/13/20 1209      PT SHORT TERM GOAL #1   Title Pt will be ind with initial HEP    Baseline -    Time 3    Period Weeks    Status New    Target Date 08/03/20      PT SHORT TERM GOAL #2   Title Pt will be instructed in spinal decompression series for pain relief and bone support/protection.    Time 3    Period Weeks    Status New    Target Date 08/03/20      PT SHORT TERM GOAL #3   Title  Pt will report 20% reduction in neck pain during the day.    Time 4    Period Weeks    Status New    Target Date 08/10/20             PT Long Term Goals - 07/13/20 1210      PT LONG TERM GOAL #1   Title Pt will improve neck rotation to at least 45 deg bil for improved visibility with driving.    Baseline -    Time 12    Period Weeks    Status New    Target Date 10/05/20      PT LONG TERM GOAL #2   Title Pt will be able to reach back of head with Lt UE with min pain for improved tolerance of hair washing and styling.    Baseline -    Time 12    Period Weeks    Status New    Target Date 10/05/20      PT LONG TERM GOAL #3   Title Pt will report at least 50% less neck pain with use of HEP, home modalities, postural self-checks and actiivty modification as tools for pain control.    Baseline -    Time 12    Period Weeks    Status New    Target Date 10/05/20      PT LONG TERM GOAL #4   Title Reduce FOTO to </= 44% to demo less limitation    Baseline 48%    Time 12    Period Weeks    Status New    Target Date 10/05/20                 Plan - 08/13/20 1305    Clinical Impression Statement Pt continues to have signif chronic pain related to scoliosis.  She benefits from [employer] and elongation along spine.  PT progressed some seated postural cueing and standing UE band ther ex today with good tolerance.  PT worked with Pt to understand how to set her timer on her phone to remind her to do repeat postural exercises several times daily, per her request.  Pt will continue to benefit from skilled PT  for postural support, pain relief and Pt education.    PT Frequency 2x / week    PT Duration 12 weeks    PT Treatment/Interventions ADLs/Self Care Home Management;Joint Manipulations;Spinal Manipulations;Manual techniques;Neuromuscular re-education;Therapeutic exercise;Therapeutic activities;Functional mobility training;Moist Heat;Electrical Stimulation;Passive range of  motion;Traction    PT Next Visit Plan review yellow band row (bil and single alt), continue postural elongation, continue manual techniques    PT Home Exercise Plan added yellow band row (bil, Rt, Lt alt), Access Code: [email or web address]    Consulted and Agree with Plan of Care Patient           Patient will benefit from skilled therapeutic intervention in order to improve the following deficits and impairments:     Visit Diagnosis: Cervicalgia  Muscle weakness (generalized)  Other idiopathic scoliosis, thoracolumbar region  Abnormal posture  Muscle spasm of back  Chronic bilateral low back pain without sciatica     Problem List Patient Active Problem List   Diagnosis Date Noted   Deviated septum 05/08/2020   Mass of subcutaneous tissue 05/02/2020   Vertigo 06/16/2019   Pulmonary hypertension, unspecified ([place]) 04/14/2019   Ischemic colitis ([place]) 04/14/2019   Migraine with aura and without status migrainosus, not intractable 11/08/2018   Degeneration of lumbar intervertebral disc 10/14/2018   Hoarseness of voice 03/04/2018   Abdominal pain 07/01/2017   Diverticulitis, colon    Metabolic acidosis, increased anion gap    Constipation 04/14/2017   Lumbar hernia 04/14/2017   Presbycusis of both ears 01/10/2017   Tinnitus aurium, bilateral 01/10/2017   Gastroesophageal reflux disease 08/20/2016   Hemoptysis 08/20/2016   Obstructive sleep apnea of adult 08/20/2016   Rhinitis, chronic 08/20/2016   Throat pain in adult 08/20/2016   Fatigue 12/23/2015   Sciatica of right side 10/06/2015   History of migraine headaches 10/06/2015   Frequent PVCs 12/28/2013   Premature atrial contractions 12/28/2013   PSVT (paroxysmal supraventricular tachycardia) ([place]) 12/28/2013   Heart palpitations 07/13/2013   Sleep apnea 04/11/2013   Scoliosis 04/11/2013   Atrial flutter with rapid ventricular response ([place]) 11/29/2012   Chest pain, atypical 11/29/2012    Fibromyalgia syndrome 11/29/2012   Chronic steroid use 11/29/2012    Jdyn Parkerson, PT 08/13/20 1:10 PM   [facility] [address] [place], [place], [address] Phone: [phone]   Fax:  [phone]  Name: Janet Nguyen MRN: [id number] Date of Birth: 04/13/1932

## 2020-08-22 ENCOUNTER — Encounter: Payer: Medicare Other | Admitting: Physical Therapy

## 2020-08-28 ENCOUNTER — Encounter: Payer: Self-pay | Admitting: Physical Therapy

## 2020-08-28 ENCOUNTER — Ambulatory Visit: Payer: Medicare Other | Admitting: Physical Therapy

## 2020-08-28 ENCOUNTER — Other Ambulatory Visit: Payer: Self-pay

## 2020-08-28 DIAGNOSIS — M4125 Other idiopathic scoliosis, thoracolumbar region: Secondary | ICD-10-CM | POA: Diagnosis not present

## 2020-08-28 DIAGNOSIS — M542 Cervicalgia: Secondary | ICD-10-CM

## 2020-08-28 DIAGNOSIS — M62830 Muscle spasm of back: Secondary | ICD-10-CM | POA: Diagnosis not present

## 2020-08-28 DIAGNOSIS — R293 Abnormal posture: Secondary | ICD-10-CM

## 2020-08-28 DIAGNOSIS — M6281 Muscle weakness (generalized): Secondary | ICD-10-CM

## 2020-08-28 DIAGNOSIS — M545 Low back pain, unspecified: Secondary | ICD-10-CM | POA: Diagnosis not present

## 2020-08-28 NOTE — Patient Instructions (Signed)
Access Code: [email or web address] URL: [email or web address] Date: 08/28/2020 Prepared by: Venetia Night Delany Steury  Exercises Standing Row with Anchored Resistance - 3 x daily - 7 x weekly - 1 sets - 15 reps Single Arm Shoulder Extension with Anchored Resistance - 3 x daily - 7 x weekly - 1 sets - 10 reps Shoulder External Rotation with Anchored Resistance - 3 x daily - 7 x weekly - 1 sets - 10 reps

## 2020-08-28 NOTE — Therapy (Signed)
[facility] [address], [place], [place], [address] Phone: [phone]   Fax:  [phone]  Physical Therapy Treatment  Patient Details  Name: Janet Nguyen MRN: [id number] Date of Birth: 15-Jan-1932 Referring Provider (PT): Lawerance Cruel, MD   Encounter Date: 08/28/2020   PT End of Session - 08/28/20 1142    Visit Number 4    Date for PT Re-Evaluation 10/05/20    Authorization Type Medicare - NEEDS KX MODIFIER    Progress Note Due on Visit 10    PT Start Time 1100    PT Stop Time 1143    PT Time Calculation (min) 43 min    Activity Tolerance Patient tolerated treatment well    Behavior During Therapy [facility] for tasks assessed/performed           Past Medical History:  Diagnosis Date   Arrhythmia    History of SVT with documented PVC'S and  PAC'S  12/08/12 Nuc stress test normal LV EF 74%  Event Monitor  12/01/12-01/03/13   Atrial flutter ([place])    Celiac disease    treated by Dr. [name]   GERD (gastroesophageal reflux disease)    Intervertebral disc stenosis of neural canal of cervical region    Irregular heart beat 11/30/12   ECHO-EF 60-65%   Osteoporosis    PMR (polymyalgia rheumatica) (HCC)    Dr. Marijean Bravo; pt states she was diagnosed 10-15 years ago, not treated at this time or any issues that she is aware of.   Scoliosis    Scoliosis    Sleep apnea 10/02/11 [facility]   Sleep study AHI -total sleep 10.3/hr  64.0/ hr during REM sleep.RDI 22.8/hr during total sleep 64.0/hr during REM sleep The lowest O2 sat during Non-REM and REM sleep was 86% and 88% respectively. 04/08/12 CPAP/BIPAP titration study [facility]    Past Surgical History:  Procedure Laterality Date   APPENDECTOMY     ruptured at age 37 and had surgery   CARDIAC CATHETERIZATION  01/27/06   cataract surgery  2015   Dr. Herbert Deaner; March & April 2015    There were no vitals filed for  this visit.   Subjective Assessment - 08/28/20 1102    Subjective I have had company and went to the beach.  I am hurting in Lt side of trunk.    Pertinent History scoliosis, sleep apnea (no CPAP)    Patient Stated Goals pain relief    Currently in Pain? Yes    Pain Score 9     Pain Location Back    Pain Orientation Left    Pain Descriptors / Indicators Aching;Tightness    Pain Type Chronic pain    Pain Onset More than a month ago    Pain Frequency Constant    Aggravating Factors  all activity    Pain Relieving Factors meds, heat                             [facility] Adult PT Treatment/Exercise - 08/28/20 0001      Exercises   Exercises Shoulder      Shoulder Exercises: Standing   External Rotation Strengthening;Right;Left;10 reps;Theraband    Theraband Level (Shoulder External Rotation) Level 1 (Yellow)    Extension Strengthening;Right;Left;10 reps;Theraband    Theraband Level (Shoulder Extension) Level 1 (Yellow)    Row Strengthening;Both;Left;Right;10 reps;Theraband    Theraband Level (Shoulder Row)  Level 1 (Yellow)    Row Limitations bil, then Rt/Lt alt      Manual Therapy   Manual Therapy Soft tissue mobilization    Soft tissue mobilization Lt QL, obliques, paraspinals, elongation myofascial release, diaphragm release on Lt                  PT Education - 08/28/20 1124    Education Details Access Code: [email or web address]    Person(s) Educated Patient    Methods Explanation;Demonstration    Comprehension Verbalized understanding            PT Short Term Goals - 07/13/20 1209      PT SHORT TERM GOAL #1   Title Pt will be ind with initial HEP    Baseline -    Time 3    Period Weeks    Status New    Target Date 08/03/20      PT SHORT TERM GOAL #2   Title Pt will be instructed in spinal decompression series for pain relief and bone support/protection.    Time 3    Period Weeks    Status New    Target Date 08/03/20      PT SHORT TERM GOAL  #3   Title Pt will report 20% reduction in neck pain during the day.    Time 4    Period Weeks    Status New    Target Date 08/10/20             PT Long Term Goals - 07/13/20 1210      PT LONG TERM GOAL #1   Title Pt will improve neck rotation to at least 45 deg bil for improved visibility with driving.    Baseline -    Time 12    Period Weeks    Status New    Target Date 10/05/20      PT LONG TERM GOAL #2   Title Pt will be able to reach back of head with Lt UE with min pain for improved tolerance of hair washing and styling.    Baseline -    Time 12    Period Weeks    Status New    Target Date 10/05/20      PT LONG TERM GOAL #3   Title Pt will report at least 50% less neck pain with use of HEP, home modalities, postural self-checks and actiivty modification as tools for pain control.    Baseline -    Time 12    Period Weeks    Status New    Target Date 10/05/20      PT LONG TERM GOAL #4   Title Reduce FOTO to </= 44% to demo less limitation    Baseline 48%    Time 12    Period Weeks    Status New    Target Date 10/05/20                 Plan - 08/28/20 1145    Clinical Impression Statement Pt continues to benefit from manual techniques for Lt trunk elongation.  She has high pain levels and limited mobility secondary to rigid scoliosis.  PT progressed standing yellow tband single arm UE ther ex to challenge core/trunk stability and imrpove postural cues and strength.  Pt with good tolerance and no increased pain wtih this.  Continue along POC with ongoing intervention.    PT Frequency 2x / week    PT Duration 12 weeks  PT Treatment/Interventions ADLs/Self Care Home Management;Joint Manipulations;Spinal Manipulations;Manual techniques;Neuromuscular re-education;Therapeutic exercise;Therapeutic activities;Functional mobility training;Moist Heat;Electrical Stimulation;Passive range of motion;Traction    PT Next Visit Plan review HEP, try gentle hip stretches  and strength    PT Home Exercise Plan added yellow band row (bil, Rt, Lt alt), Access Code: [email or web address]    Consulted and Agree with Plan of Care Patient           Patient will benefit from skilled therapeutic intervention in order to improve the following deficits and impairments:     Visit Diagnosis: Cervicalgia  Muscle weakness (generalized)  Other idiopathic scoliosis, thoracolumbar region  Abnormal posture  Muscle spasm of back     Problem List Patient Active Problem List   Diagnosis Date Noted   Deviated septum 05/08/2020   Mass of subcutaneous tissue 05/02/2020   Vertigo 06/16/2019   Pulmonary hypertension, unspecified ([place]) 04/14/2019   Ischemic colitis ([place]) 04/14/2019   Migraine with aura and without status migrainosus, not intractable 11/08/2018   Degeneration of lumbar intervertebral disc 10/14/2018   Hoarseness of voice 03/04/2018   Abdominal pain 07/01/2017   Diverticulitis, colon    Metabolic acidosis, increased anion gap    Constipation 04/14/2017   Lumbar hernia 04/14/2017   Presbycusis of both ears 01/10/2017   Tinnitus aurium, bilateral 01/10/2017   Gastroesophageal reflux disease 08/20/2016   Hemoptysis 08/20/2016   Obstructive sleep apnea of adult 08/20/2016   Rhinitis, chronic 08/20/2016   Throat pain in adult 08/20/2016   Fatigue 12/23/2015   Sciatica of right side 10/06/2015   History of migraine headaches 10/06/2015   Frequent PVCs 12/28/2013   Premature atrial contractions 12/28/2013   PSVT (paroxysmal supraventricular tachycardia) ([place]) 12/28/2013   Heart palpitations 07/13/2013   Sleep apnea 04/11/2013   Scoliosis 04/11/2013   Atrial flutter with rapid ventricular response ([place]) 11/29/2012   Chest pain, atypical 11/29/2012   Fibromyalgia syndrome 11/29/2012   Chronic steroid use 11/29/2012    Baruch Merl, PT 08/28/20 11:47 AM   [facility] [address] [place], [place], [address] Phone: [phone]   Fax:  [phone]  Name: Janet Nguyen MRN: [id number] Date of Birth: [birth year]

## 2020-08-29 DIAGNOSIS — G43909 Migraine, unspecified, not intractable, without status migrainosus: Secondary | ICD-10-CM | POA: Diagnosis not present

## 2020-08-29 DIAGNOSIS — F419 Anxiety disorder, unspecified: Secondary | ICD-10-CM | POA: Diagnosis not present

## 2020-08-29 DIAGNOSIS — R140 Abdominal distension (gaseous): Secondary | ICD-10-CM | POA: Diagnosis not present

## 2020-09-03 ENCOUNTER — Ambulatory Visit: Payer: Medicare Other | Attending: Family Medicine | Admitting: Physical Therapy

## 2020-09-03 ENCOUNTER — Encounter: Payer: Self-pay | Admitting: Physical Therapy

## 2020-09-03 ENCOUNTER — Other Ambulatory Visit: Payer: Self-pay

## 2020-09-03 DIAGNOSIS — M62830 Muscle spasm of back: Secondary | ICD-10-CM | POA: Insufficient documentation

## 2020-09-03 DIAGNOSIS — M4125 Other idiopathic scoliosis, thoracolumbar region: Secondary | ICD-10-CM | POA: Diagnosis not present

## 2020-09-03 DIAGNOSIS — M6281 Muscle weakness (generalized): Secondary | ICD-10-CM | POA: Diagnosis not present

## 2020-09-03 DIAGNOSIS — M542 Cervicalgia: Secondary | ICD-10-CM | POA: Insufficient documentation

## 2020-09-03 DIAGNOSIS — R293 Abnormal posture: Secondary | ICD-10-CM | POA: Diagnosis not present

## 2020-09-03 NOTE — Therapy (Signed)
[facility] [address] [place], [place], [address] Phone: [phone]   Fax:  [phone]  Physical Therapy Treatment  Patient Details  Name: Janet Nguyen MRN: [id number] Date of Birth: Aug 23, 1932 Referring Provider (PT): Lawerance Cruel, MD   Encounter Date: 09/03/2020   PT End of Session - 09/03/20 1258    Visit Number 5    Date for PT Re-Evaluation 10/05/20    Authorization Type Medicare - NEEDS KX MODIFIER    Progress Note Due on Visit 10    PT Start Time 1102    PT Stop Time 1145    PT Time Calculation (min) 43 min    Activity Tolerance Patient tolerated treatment well    Behavior During Therapy [facility] for tasks assessed/performed           Past Medical History:  Diagnosis Date  . Arrhythmia    History of SVT with documented PVC'S and  PAC'S  12/08/12 Nuc stress test normal LV EF 74%  Event Monitor  12/01/12-01/03/13  . Atrial flutter ([place])   . Celiac disease    treated by Dr. [name]  . GERD (gastroesophageal reflux disease)   . Intervertebral disc stenosis of neural canal of cervical region   . Irregular heart beat 11/30/12   ECHO-EF 60-65%  . Osteoporosis   . PMR (polymyalgia rheumatica) (HCC)    Dr. Marijean Bravo; pt states she was diagnosed 10-15 years ago, not treated at this time or any issues that she is aware of.  . Scoliosis   . Scoliosis   . Sleep apnea 10/02/11 [facility]   Sleep study AHI -total sleep 10.3/hr  64.0/ hr during REM sleep.RDI 22.8/hr during total sleep 64.0/hr during REM sleep The lowest O2 sat during Non-REM and REM sleep was 86% and 88% respectively. 04/08/12 CPAP/BIPAP titration study [facility]    Past Surgical History:  Procedure Laterality Date  . APPENDECTOMY     ruptured at age 24 and had surgery  . CARDIAC CATHETERIZATION  01/27/06  . cataract surgery  2015   Dr. Herbert Deaner; March & April 2015    There were no vitals filed for  this visit.       [facility] PT Assessment - 09/03/20 0001      Ambulation/Gait   Gait Comments trunk alignment with bil arm swing                          [facility] Adult PT Treatment/Exercise - 09/03/20 0001      Neuro Re-ed    Neuro Re-ed Details  standing posture VCs/TCs for alignment to correct for sway back and maintain while performing slow small range neck retractions, scap retraction, shoulder ext, row without resistance to focus on trunk control      Shoulder Exercises: Standing   External Rotation Strengthening;Theraband;15 reps;Both    Theraband Level (Shoulder External Rotation) Level 1 (Yellow)    External Rotation Limitations with full body postural cueing    Extension Strengthening;Right;Left;10 reps;Theraband    Theraband Level (Shoulder Extension) Level 1 (Yellow)    Extension Limitations with full body postural cueing    Row Strengthening;Both;Left;Right;10 reps;Theraband    Theraband Level (Shoulder Row) Level 1 (Yellow)    Row Limitations with full body postural cueing      Manual Therapy   Manual Therapy Soft tissue mobilization;Manual Traction    Soft tissue mobilization bil upper traps, thoracic parspinals,  scalenes, Lt QL, obliques, paraspinals, elongation myofascial release, diaphragm release on Lt    Manual Traction seated Gr I/II elongation cervical spine                    PT Short Term Goals - 09/03/20 1302      PT SHORT TERM GOAL #1   Title Pt will be ind with initial HEP    Status On-going      PT SHORT TERM GOAL #2   Title Pt will be instructed in spinal decompression series for pain relief and bone support/protection.    Status On-going      PT SHORT TERM GOAL #3   Title Pt will report 20% reduction in neck pain during the day.    Status On-going             PT Long Term Goals - 07/13/20 1210      PT LONG TERM GOAL #1   Title Pt will improve neck rotation to at least 45 deg bil for improved visibility with  driving.    Baseline -    Time 12    Period Weeks    Status New    Target Date 10/05/20      PT LONG TERM GOAL #2   Title Pt will be able to reach back of head with Lt UE with min pain for improved tolerance of hair washing and styling.    Baseline -    Time 12    Period Weeks    Status New    Target Date 10/05/20      PT LONG TERM GOAL #3   Title Pt will report at least 50% less neck pain with use of HEP, home modalities, postural self-checks and actiivty modification as tools for pain control.    Baseline -    Time 12    Period Weeks    Status New    Target Date 10/05/20      PT LONG TERM GOAL #4   Title Reduce FOTO to </= 44% to demo less limitation    Baseline 48%    Time 12    Period Weeks    Status New    Target Date 10/05/20                 Plan - 09/03/20 1258    Clinical Impression Statement Pt demo'd improving postural alignment and control in standing. She gets signif neck tightness with use of yellow tband for postural strength, so PT focused on cues and A/ROM for postural strength with improved success and less pain.  Pt continues to benefit from skilled manual therapy for elongation along spine and trunk.  PT encouraged her to continue walking for exercsie with more focus on postural alignment during walks.  Continue along POC.    Rehab Potential Good    PT Frequency 2x / week    PT Duration 12 weeks    PT Treatment/Interventions ADLs/Self Care Home Management;Joint Manipulations;Spinal Manipulations;Manual techniques;Neuromuscular re-education;Therapeutic exercise;Therapeutic activities;Functional mobility training;Moist Heat;Electrical Stimulation;Passive range of motion;Traction    PT Next Visit Plan continue postural cueing and strength in standing, manual for trunk elongation    PT Home Exercise Plan added yellow band row (bil, Rt, Lt alt), Access Code: [email or web address]    Consulted and Agree with Plan of Care Patient           Patient will benefit  from skilled therapeutic intervention in order to improve the following deficits and  impairments:     Visit Diagnosis: Cervicalgia  Muscle weakness (generalized)  Other idiopathic scoliosis, thoracolumbar region  Abnormal posture     Problem List Patient Active Problem List   Diagnosis Date Noted  . Deviated septum 05/08/2020  . Mass of subcutaneous tissue 05/02/2020  . Vertigo 06/16/2019  . Pulmonary hypertension, unspecified ([place]) 04/14/2019  . Ischemic colitis ([place]) 04/14/2019  . Migraine with aura and without status migrainosus, not intractable 11/08/2018  . Degeneration of lumbar intervertebral disc 10/14/2018  . Hoarseness of voice 03/04/2018  . Abdominal pain 07/01/2017  . Diverticulitis, colon   . Metabolic acidosis, increased anion gap   . Constipation 04/14/2017  . Lumbar hernia 04/14/2017  . Presbycusis of both ears 01/10/2017  . Tinnitus aurium, bilateral 01/10/2017  . Gastroesophageal reflux disease 08/20/2016  . Hemoptysis 08/20/2016  . Obstructive sleep apnea of adult 08/20/2016  . Rhinitis, chronic 08/20/2016  . Throat pain in adult 08/20/2016  . Fatigue 12/23/2015  . Sciatica of right side 10/06/2015  . History of migraine headaches 10/06/2015  . Frequent PVCs 12/28/2013  . Premature atrial contractions 12/28/2013  . PSVT (paroxysmal supraventricular tachycardia) ([place]) 12/28/2013  . Heart palpitations 07/13/2013  . Sleep apnea 04/11/2013  . Scoliosis 04/11/2013  . Atrial flutter with rapid ventricular response ([place]) 11/29/2012  . Chest pain, atypical 11/29/2012  . Fibromyalgia syndrome 11/29/2012  . Chronic steroid use 11/29/2012    Baruch Merl, PT 09/03/20 1:03 PM   [facility] [address] [place], [place], [address] Phone: [phone]   Fax:  [phone]  Name: Janet Nguyen MRN: [id number] Date of Birth: 1932-04-19

## 2020-09-05 ENCOUNTER — Encounter: Payer: Self-pay | Admitting: Physical Therapy

## 2020-09-05 ENCOUNTER — Ambulatory Visit: Payer: Medicare Other | Admitting: Physical Therapy

## 2020-09-05 ENCOUNTER — Other Ambulatory Visit: Payer: Self-pay

## 2020-09-05 DIAGNOSIS — M542 Cervicalgia: Secondary | ICD-10-CM

## 2020-09-05 DIAGNOSIS — M62830 Muscle spasm of back: Secondary | ICD-10-CM | POA: Diagnosis not present

## 2020-09-05 DIAGNOSIS — R293 Abnormal posture: Secondary | ICD-10-CM | POA: Diagnosis not present

## 2020-09-05 DIAGNOSIS — M4125 Other idiopathic scoliosis, thoracolumbar region: Secondary | ICD-10-CM

## 2020-09-05 DIAGNOSIS — M6281 Muscle weakness (generalized): Secondary | ICD-10-CM | POA: Diagnosis not present

## 2020-09-05 NOTE — Therapy (Signed)
[facility] [address] [place], [place], [address] Phone: [phone]   Fax:  [phone]  Physical Therapy Treatment  Patient Details  Name: Janet Nguyen MRN: [id number] Date of Birth: 01/02/1932 Referring Provider (PT): Lawerance Cruel, MD   Encounter Date: 09/05/2020   PT End of Session - 09/05/20 1122    Visit Number 6    Date for PT Re-Evaluation 10/05/20    Authorization Type Medicare - NEEDS KX MODIFIER    Progress Note Due on Visit 10    PT Start Time 1100    PT Stop Time 1145    PT Time Calculation (min) 45 min    Activity Tolerance Patient tolerated treatment well    Behavior During Therapy [facility] for tasks assessed/performed           Past Medical History:  Diagnosis Date  . Arrhythmia    History of SVT with documented PVC'S and  PAC'S  12/08/12 Nuc stress test normal LV EF 74%  Event Monitor  12/01/12-01/03/13  . Atrial flutter ([place])   . Celiac disease    treated by Dr. [name]  . GERD (gastroesophageal reflux disease)   . Intervertebral disc stenosis of neural canal of cervical region   . Irregular heart beat 11/30/12   ECHO-EF 60-65%  . Osteoporosis   . PMR (polymyalgia rheumatica) (HCC)    Dr. Marijean Bravo; pt states she was diagnosed 10-15 years ago, not treated at this time or any issues that she is aware of.  . Scoliosis   . Scoliosis   . Sleep apnea 10/02/11 [facility]   Sleep study AHI -total sleep 10.3/hr  64.0/ hr during REM sleep.RDI 22.8/hr during total sleep 64.0/hr during REM sleep The lowest O2 sat during Non-REM and REM sleep was 86% and 88% respectively. 04/08/12 [facility]    Past Surgical History:  Procedure Laterality Date  . APPENDECTOMY     ruptured at age 33 and had surgery  . CARDIAC CATHETERIZATION  01/27/06  . cataract surgery  2015   Dr. Herbert Deaner; March & April 2015    There were no vitals filed for  this visit.   Subjective Assessment - 09/05/20 1103    Subjective I thought more about my posture when I walked last night.    Pertinent History scoliosis, sleep apnea (no CPAP)    Limitations House hold activities;Lifting    Patient Stated Goals pain relief    Currently in Pain? Yes    Pain Score 5     Pain Location Back    Pain Onset More than a month ago    Pain Frequency Intermittent                             [facility] Adult PT Treatment/Exercise - 09/05/20 0001      Neck Exercises: Machines for Strengthening   Nustep L1 x 4' seat 8 arms 10, PT present to monitor      Neck Exercises: Supine   Neck Retraction 5 reps;5 secs    Neck Retraction Limitations with towel roll under neck      Shoulder Exercises: Supine   Flexion AROM;Strengthening;Both;10 reps;Weights    Flexion Limitations cane + 3lb ankle weight    Other Supine Exercises Lt arm overhead and leg lengthener 2x20 sec, 1x20 on Rt      Shoulder Exercises: Standing   External  Nurse, children's;Theraband;15 reps;Both    Theraband Level (Shoulder External Rotation) Level 1 (Yellow)    External Rotation Limitations with full body postural cueing    Extension Strengthening;Right;Left;10 reps;Theraband    Theraband Level (Shoulder Extension) Level 1 (Yellow)    Extension Limitations with full body postural cueing    Row Strengthening;Both;Left;Right;10 reps;Theraband    Theraband Level (Shoulder Row) Level 1 (Yellow)    Row Limitations with full body postural cueing      Manual Therapy   Manual Therapy Soft tissue mobilization    Soft tissue mobilization bil upper traps, thoracic parspinals, scalenes, Lt QL, obliques, paraspinals, elongation myofascial release, diaphragm release on Lt                    PT Short Term Goals - 09/03/20 1302      PT SHORT TERM GOAL #1   Title Pt will be ind with initial HEP    Status On-going      PT SHORT TERM GOAL #2   Title Pt will be instructed  in spinal decompression series for pain relief and bone support/protection.    Status On-going      PT SHORT TERM GOAL #3   Title Pt will report 20% reduction in neck pain during the day.    Status On-going             PT Long Term Goals - 07/13/20 1210      PT LONG TERM GOAL #1   Title Pt will improve neck rotation to at least 45 deg bil for improved visibility with driving.    Baseline -    Time 12    Period Weeks    Status New    Target Date 10/05/20      PT LONG TERM GOAL #2   Title Pt will be able to reach back of head with Lt UE with min pain for improved tolerance of hair washing and styling.    Baseline -    Time 12    Period Weeks    Status New    Target Date 10/05/20      PT LONG TERM GOAL #3   Title Pt will report at least 50% less neck pain with use of HEP, home modalities, postural self-checks and actiivty modification as tools for pain control.    Baseline -    Time 12    Period Weeks    Status New    Target Date 10/05/20      PT LONG TERM GOAL #4   Title Reduce FOTO to </= 44% to demo less limitation    Baseline 48%    Time 12    Period Weeks    Status New    Target Date 10/05/20                 Plan - 09/05/20 1147    Clinical Impression Statement Pt arrived for second visit this week with lower pain rating of 3-5/10 as compared to 9/10 at previous visits.  She had improved ability to demo good standing posture and needed less cueing during postural ther ex.  She was able to tolerate NuStep for first time.  Soft tissues had improved elongation and less TPs/tension today, especially in bil upper traps.  Twice weekly appointments for the next several weeks will likely be beneficial in pain reduction and flexibility of soft tissues and spine.    PT Frequency 2x / week    PT Duration  12 weeks    PT Treatment/Interventions ADLs/Self Care Home Management;Joint Manipulations;Spinal Manipulations;Manual techniques;Neuromuscular  re-education;Therapeutic exercise;Therapeutic activities;Functional mobility training;Moist Heat;Electrical Stimulation;Passive range of motion;Traction    PT Next Visit Plan NuStep x 4', continue postural cueing and strength in standing, manual for trunk elongation    PT Home Exercise Plan added yellow band row (bil, Rt, Lt alt), Access Code: [email or web address]    Consulted and Agree with Plan of Care Patient           Patient will benefit from skilled therapeutic intervention in order to improve the following deficits and impairments:     Visit Diagnosis: Cervicalgia  Muscle weakness (generalized)  Other idiopathic scoliosis, thoracolumbar region  Abnormal posture     Problem List Patient Active Problem List   Diagnosis Date Noted  . Deviated septum 05/08/2020  . Mass of subcutaneous tissue 05/02/2020  . Vertigo 06/16/2019  . Pulmonary hypertension, unspecified ([place]) 04/14/2019  . Ischemic colitis ([place]) 04/14/2019  . Migraine with aura and without status migrainosus, not intractable 11/08/2018  . Degeneration of lumbar intervertebral disc 10/14/2018  . Hoarseness of voice 03/04/2018  . Abdominal pain 07/01/2017  . Diverticulitis, colon   . Metabolic acidosis, increased anion gap   . Constipation 04/14/2017  . Lumbar hernia 04/14/2017  . Presbycusis of both ears 01/10/2017  . Tinnitus aurium, bilateral 01/10/2017  . Gastroesophageal reflux disease 08/20/2016  . Hemoptysis 08/20/2016  . Obstructive sleep apnea of adult 08/20/2016  . Rhinitis, chronic 08/20/2016  . Throat pain in adult 08/20/2016  . Fatigue 12/23/2015  . Sciatica of right side 10/06/2015  . History of migraine headaches 10/06/2015  . Frequent PVCs 12/28/2013  . Premature atrial contractions 12/28/2013  . PSVT (paroxysmal supraventricular tachycardia) ([place]) 12/28/2013  . Heart palpitations 07/13/2013  . Sleep apnea 04/11/2013  . Scoliosis 04/11/2013  . Atrial flutter with rapid ventricular response ([place])  11/29/2012  . Chest pain, atypical 11/29/2012  . Fibromyalgia syndrome 11/29/2012  . Chronic steroid use 11/29/2012    Baruch Merl, PT 09/05/20 12:34 PM   [facility] [address] [place], [place], [address] Phone: [phone]   Fax:  [phone]  Name: SABRYNA LAHM MRN: [id number] Date of Birth: 04-14-1932

## 2020-09-10 ENCOUNTER — Ambulatory Visit: Payer: Medicare Other | Admitting: Physical Therapy

## 2020-09-10 ENCOUNTER — Encounter: Payer: Self-pay | Admitting: Physical Therapy

## 2020-09-10 ENCOUNTER — Other Ambulatory Visit: Payer: Self-pay

## 2020-09-10 DIAGNOSIS — M6281 Muscle weakness (generalized): Secondary | ICD-10-CM | POA: Diagnosis not present

## 2020-09-10 DIAGNOSIS — M542 Cervicalgia: Secondary | ICD-10-CM

## 2020-09-10 DIAGNOSIS — M62830 Muscle spasm of back: Secondary | ICD-10-CM | POA: Diagnosis not present

## 2020-09-10 DIAGNOSIS — M4125 Other idiopathic scoliosis, thoracolumbar region: Secondary | ICD-10-CM | POA: Diagnosis not present

## 2020-09-10 DIAGNOSIS — R293 Abnormal posture: Secondary | ICD-10-CM

## 2020-09-10 NOTE — Therapy (Signed)
[facility] [address], [place], [place], [address] Phone: [phone]   Fax:  [phone]  Physical Therapy Treatment  Patient Details  Name: Janet Nguyen MRN: [id number] Date of Birth: 1932-06-16 Referring Provider (PT): Lawerance Cruel, MD   Encounter Date: 09/10/2020   PT End of Session - 09/10/20 1101    Visit Number 7    Date for PT Re-Evaluation 10/05/20    Authorization Type Medicare - NEEDS KX MODIFIER    Progress Note Due on Visit 10    PT Start Time 1101    PT Stop Time 1146    PT Time Calculation (min) 45 min    Activity Tolerance Patient tolerated treatment well    Behavior During Therapy [facility] for tasks assessed/performed           Past Medical History:  Diagnosis Date   Arrhythmia    History of SVT with documented PVC'S and  PAC'S  12/08/12 Nuc stress test normal LV EF 74%  Event Monitor  12/01/12-01/03/13   Atrial flutter ([place])    Celiac disease    treated by Dr. [name]   GERD (gastroesophageal reflux disease)    Intervertebral disc stenosis of neural canal of cervical region    Irregular heart beat 11/30/12   ECHO-EF 60-65%   Osteoporosis    PMR (polymyalgia rheumatica) (HCC)    Dr. Marijean Bravo; pt states she was diagnosed 10-15 years ago, not treated at this time or any issues that she is aware of.   Scoliosis    Scoliosis    Sleep apnea 10/02/11 [facility]   Sleep study AHI -total sleep 10.3/hr  64.0/ hr during REM sleep.RDI 22.8/hr during total sleep 64.0/hr during REM sleep The lowest O2 sat during Non-REM and REM sleep was 86% and 88% respectively. 04/08/12 CPAP/BIPAP titration study [facility]    Past Surgical History:  Procedure Laterality Date   APPENDECTOMY     ruptured at age 37 and had surgery   CARDIAC CATHETERIZATION  01/27/06   cataract surgery  2015   Dr. Herbert Deaner; March & April 2015    There were no vitals filed for  this visit.   Subjective Assessment - 09/10/20 1101    Subjective I have been getting migraines 2-3 times a day.  They give me a headache in the back of my head.  I took 2 Tylenol this morning for neck pain.    Pertinent History scoliosis, sleep apnea (no CPAP)    Limitations House hold activities;Lifting    Patient Stated Goals pain relief    Currently in Pain? Yes    Pain Score 7     Pain Location Neck    Pain Orientation Posterior;Left;Right;Upper    Pain Descriptors / Indicators Aching;Tightness;Headache    Pain Type Chronic pain    Pain Onset More than a month ago                             [facility] Adult PT Treatment/Exercise - 09/10/20 0001      Exercises   Exercises Shoulder;Neck;Lumbar      Neck Exercises: Seated   Neck Retraction 5 reps    Cervical Rotation 5 reps    Cervical Rotation Limitations bil    Shoulder Rolls Backwards;10 reps      Lumbar Exercises: Stretches   Active Hamstring Stretch Right;Left;2 reps;20 seconds    Active Hamstring  Stretch Limitations supine    Single Knee to Chest Stretch 5 reps    Double Knee to Chest Stretch 2 reps;10 seconds      Lumbar Exercises: Supine   Other Supine Lumbar Exercises spinal decompression series: leg lengthener, leg press, scapular retraction 4x5 sec holds each, bil LEs      Shoulder Exercises: Supine   Flexion AAROM;Both;20 reps    Flexion Limitations dowel bil overhead reach      Manual Therapy   Manual Therapy Soft tissue mobilization    Soft tissue mobilization bil scalenes, upper traps, SOs, lats, Rt pectorals                    PT Short Term Goals - 09/10/20 1300      PT SHORT TERM GOAL #1   Title Pt will be ind with initial HEP    Baseline doing 2x/day    Status Achieved      PT SHORT TERM GOAL #2   Title Pt will be instructed in spinal decompression series for pain relief and bone support/protection.    Status Achieved      PT SHORT TERM GOAL #3   Title Pt will  report 20% reduction in neck pain during the day.    Baseline varies    Status Partially Met             PT Long Term Goals - 09/10/20 1300      PT LONG TERM GOAL #1   Title Pt will improve neck rotation to at least 45 deg bil for improved visibility with driving.    Status On-going      PT LONG TERM GOAL #2   Title Pt will be able to reach back of head with Lt UE with min pain for improved tolerance of hair washing and styling.    Status On-going      PT LONG TERM GOAL #3   Title Pt will report at least 50% less neck pain with use of HEP, home modalities, postural self-checks and actiivty modification as tools for pain control.    Status On-going      PT LONG TERM GOAL #4   Title Reduce FOTO to </= 44% to demo less limitation    Status New                 Plan - 09/10/20 1256    Clinical Impression Statement Pt arrived with intermittent ongoing migraines that were causing posterior headache and neck pain.  Session focused on manual techniques for release of upper quadrant and neck soft tissues, followed by spinal elongation and postural strength cues in supine.  Pt has ongoing chronic pain related to severe rigid scoliosis but Pt is demonstrating more erect posture and awareness of alignment.  She needs less cueing during postural exercises for proper alignment and activation.  She is finding more mobility with PT sessions.  Continue along POC.    Rehab Potential Good    PT Frequency 2x / week    PT Duration 12 weeks    PT Treatment/Interventions ADLs/Self Care Home Management;Joint Manipulations;Spinal Manipulations;Manual techniques;Neuromuscular re-education;Therapeutic exercise;Therapeutic activities;Functional mobility training;Moist Heat;Electrical Stimulation;Passive range of motion;Traction    PT Next Visit Plan f/u on headaches, NuStep x 4', supine AA/ROM shoulders, spinal decompression/elongation and UE horiz abd, hamstring stretches, continue postural cueing and  strength in standing, manual for trunk elongation    PT Home Exercise Plan Access Code: [email or web address]    Consulted and Agree  with Plan of Care Patient           Patient will benefit from skilled therapeutic intervention in order to improve the following deficits and impairments:     Visit Diagnosis: Cervicalgia  Muscle weakness (generalized)  Other idiopathic scoliosis, thoracolumbar region  Abnormal posture     Problem List Patient Active Problem List   Diagnosis Date Noted   Deviated septum 05/08/2020   Mass of subcutaneous tissue 05/02/2020   Vertigo 06/16/2019   Pulmonary hypertension, unspecified ([place]) 04/14/2019   Ischemic colitis ([place]) 04/14/2019   Migraine with aura and without status migrainosus, not intractable 11/08/2018   Degeneration of lumbar intervertebral disc 10/14/2018   Hoarseness of voice 03/04/2018   Abdominal pain 07/01/2017   Diverticulitis, colon    Metabolic acidosis, increased anion gap    Constipation 04/14/2017   Lumbar hernia 04/14/2017   Presbycusis of both ears 01/10/2017   Tinnitus aurium, bilateral 01/10/2017   Gastroesophageal reflux disease 08/20/2016   Hemoptysis 08/20/2016   Obstructive sleep apnea of adult 08/20/2016   Rhinitis, chronic 08/20/2016   Throat pain in adult 08/20/2016   Fatigue 12/23/2015   Sciatica of right side 10/06/2015   History of migraine headaches 10/06/2015   Frequent PVCs 12/28/2013   Premature atrial contractions 12/28/2013   PSVT (paroxysmal supraventricular tachycardia) ([place]) 12/28/2013   Heart palpitations 07/13/2013   Sleep apnea 04/11/2013   Scoliosis 04/11/2013   Atrial flutter with rapid ventricular response ([place]) 11/29/2012   Chest pain, atypical 11/29/2012   Fibromyalgia syndrome 11/29/2012   Chronic steroid use 11/29/2012    Baruch Merl, PT 09/10/20 1:01 PM   [facility] [address], [place], [place], [address] Phone: [phone]   Fax:  [phone]  Name: Janet Nguyen MRN: [id number] Date of Birth: [birth year]

## 2020-09-12 ENCOUNTER — Encounter: Payer: Self-pay | Admitting: Physical Therapy

## 2020-09-12 ENCOUNTER — Ambulatory Visit: Payer: Medicare Other | Admitting: Physical Therapy

## 2020-09-12 ENCOUNTER — Other Ambulatory Visit: Payer: Self-pay

## 2020-09-12 DIAGNOSIS — M4125 Other idiopathic scoliosis, thoracolumbar region: Secondary | ICD-10-CM | POA: Diagnosis not present

## 2020-09-12 DIAGNOSIS — M6281 Muscle weakness (generalized): Secondary | ICD-10-CM

## 2020-09-12 DIAGNOSIS — M542 Cervicalgia: Secondary | ICD-10-CM

## 2020-09-12 DIAGNOSIS — R293 Abnormal posture: Secondary | ICD-10-CM | POA: Diagnosis not present

## 2020-09-12 DIAGNOSIS — M62830 Muscle spasm of back: Secondary | ICD-10-CM | POA: Diagnosis not present

## 2020-09-12 NOTE — Therapy (Signed)
[facility] [address] [place], [place], [address] Phone: [phone]   Fax:  [phone]  Physical Therapy Treatment  Patient Details  Name: Janet Nguyen MRN: [id number] Date of Birth: [birth year] Referring Provider (PT): Lawerance Cruel, MD   Encounter Date: 09/12/2020   PT End of Session - 09/12/20 1124    Visit Number 8    Date for PT Re-Evaluation 10/05/20    Authorization Type Medicare - NEEDS KX MODIFIER    Progress Note Due on Visit 10    PT Start Time 1102    PT Stop Time 1145    PT Time Calculation (min) 43 min    Activity Tolerance Patient tolerated treatment well    Behavior During Therapy [facility] for tasks assessed/performed           Past Medical History:  Diagnosis Date  . Arrhythmia    History of SVT with documented PVC'S and  PAC'S  12/08/12 Nuc stress test normal LV EF 74%  Event Monitor  12/01/12-01/03/13  . Atrial flutter ([place])   . Celiac disease    treated by Dr. [name]  . GERD (gastroesophageal reflux disease)   . Intervertebral disc stenosis of neural canal of cervical region   . Irregular heart beat 11/30/12   ECHO-EF 60-65%  . Osteoporosis   . PMR (polymyalgia rheumatica) (HCC)    Dr. Marijean Bravo; pt states she was diagnosed 10-15 years ago, not treated at this time or any issues that she is aware of.  . Scoliosis   . Scoliosis   . Sleep apnea 10/02/11 [facility]   Sleep study AHI -total sleep 10.3/hr  64.0/ hr during REM sleep.RDI 22.8/hr during total sleep 64.0/hr during REM sleep The lowest O2 sat during Non-REM and REM sleep was 86% and 88% respectively. 04/08/12 CPAP/BIPAP titration study [facility]    Past Surgical History:  Procedure Laterality Date  . APPENDECTOMY     ruptured at age 67 and had surgery  . CARDIAC CATHETERIZATION  01/27/06  . cataract surgery  2015   Dr. Herbert Deaner; March & April 2015    There were no vitals filed for  this visit.   Subjective Assessment - 09/12/20 1106    Subjective I am not in much discomfort today.    Pertinent History scoliosis, sleep apnea (no CPAP)    Limitations House hold activities;Lifting    Patient Stated Goals pain relief    Currently in Pain? Yes    Pain Score 5     Pain Location Back    Pain Type Chronic pain    Pain Onset More than a month ago                             [facility] Adult PT Treatment/Exercise - 09/12/20 0001      Neck Exercises: Machines for Strengthening   Nustep L2 x 6' PT present to monitor      Lumbar Exercises: Stretches   Active Hamstring Stretch Right;Left;2 reps;20 seconds    Active Hamstring Stretch Limitations supine with ankle circles    Single Knee to Chest Stretch 2 reps;10 seconds    Double Knee to Chest Stretch 1 rep;10 seconds      Lumbar Exercises: Supine   Other Supine Lumbar Exercises spinal decompression series: leg lengthener, leg press, scapular retraction 3x5 sec holds each, bil LEs      Shoulder  Exercises: Supine   Protraction AROM;Both    Protraction Limitations holding dowel, then 1x10 with 1lb bil    Horizontal ABduction Strengthening;Both;10 reps;Theraband    Theraband Level (Shoulder Horizontal ABduction) Level 1 ([place]ellow)    External Rotation Strengthening;Both;Theraband    Theraband Level (Shoulder External Rotation) Level 1 ([place]ellow)    Flexion AAROM;Both;10 reps    Shoulder Flexion Weight (lbs) 1lb    Flexion Limitations dowel, in neck retraction, then 1lb single arm flexion 2x5    Diagonals Strengthening;Both;10 reps;Theraband    Theraband Level (Shoulder Diagonals) Level 1 ([place]ellow)    Diagonals Limitations draw the sword      Manual Therapy   Manual Therapy Soft tissue mobilization    Soft tissue mobilization bil scalenes, upper traps, SOs, lats, Rt pectorals                    PT Short Term Goals - 09/10/20 1300      PT SHORT TERM GOAL #1   Title Pt will be ind with initial  HEP    Baseline doing 2x/day    Status Achieved      PT SHORT TERM GOAL #2   Title Pt will be instructed in spinal decompression series for pain relief and bone support/protection.    Status Achieved      PT SHORT TERM GOAL #3   Title Pt will report 20% reduction in neck pain during the day.    Baseline varies    Status Partially Met             PT Long Term Goals - 09/10/20 1300      PT LONG TERM GOAL #1   Title Pt will improve neck rotation to at least 45 deg bil for improved visibility with driving.    Status On-going      PT LONG TERM GOAL #2   Title Pt will be able to reach back of head with Lt UE with min pain for improved tolerance of hair washing and styling.    Status On-going      PT LONG TERM GOAL #3   Title Pt will report at least 50% less neck pain with use of HEP, home modalities, postural self-checks and actiivty modification as tools for pain control.    Status On-going      PT LONG TERM GOAL #4   Title Reduce FOTO to </= 44% to demo less limitation    Status New                 Plan - 09/12/20 1124    Clinical Impression Statement Pt arrived with less pain having had PT 2x this week vs once a week.  She was able to perform increased UE and neck strength/stabilization today with good form and no increased pain.  She noted some soreness in her head and spine after lying supine for ther ex.  She was able to perform 6 min on the NuStep today with heat on spine.  She continues to benefit from manual techniques to address soft tissue tension in upper quadrants and for elongation along spine.  She displays consistent improvements in postural awareness.  She will continue to benefit from 2x/week vs 1x/week for more time to spend on combined manual and therapeutic exercises to address her condition.    PT Frequency 2x / week    PT Duration 12 weeks    PT Treatment/Interventions ADLs/Self Care Home Management;Joint Manipulations;Spinal Manipulations;Manual  techniques;Neuromuscular re-education;Therapeutic exercise;Therapeutic activities;Functional  mobility training;Moist Heat;Electrical Stimulation;Passive range of motion;Traction    PT Next Visit Plan NuStep x 6', supine AA/ROM shoulders, spinal decompression/elongation and UE horiz abd, hamstring stretches, continue postural cueing and strength in standing, manual for trunk elongation    PT Home Exercise Plan Access Code: [email or web address]    Consulted and Agree with Plan of Care Patient           Patient will benefit from skilled therapeutic intervention in order to improve the following deficits and impairments:     Visit Diagnosis: Cervicalgia  Muscle weakness (generalized)  Other idiopathic scoliosis, thoracolumbar region  Abnormal posture     Problem List Patient Active Problem List   Diagnosis Date Noted  . Deviated septum 05/08/2020  . Mass of subcutaneous tissue 05/02/2020  . Vertigo 06/16/2019  . Pulmonary hypertension, unspecified ([place]) 04/14/2019  . Ischemic colitis ([place]) 04/14/2019  . Migraine with aura and without status migrainosus, not intractable 11/08/2018  . Degeneration of lumbar intervertebral disc 10/14/2018  . Hoarseness of voice 03/04/2018  . Abdominal pain 07/01/2017  . Diverticulitis, colon   . Metabolic acidosis, increased anion gap   . Constipation 04/14/2017  . Lumbar hernia 04/14/2017  . Presbycusis of both ears 01/10/2017  . Tinnitus aurium, bilateral 01/10/2017  . Gastroesophageal reflux disease 08/20/2016  . Hemoptysis 08/20/2016  . Obstructive sleep apnea of adult 08/20/2016  . Rhinitis, chronic 08/20/2016  . Throat pain in adult 08/20/2016  . Fatigue 12/23/2015  . Sciatica of right side 10/06/2015  . History of migraine headaches 10/06/2015  . Frequent PVCs 12/28/2013  . Premature atrial contractions 12/28/2013  . PSVT (paroxysmal supraventricular tachycardia) ([place]) 12/28/2013  . Heart palpitations 07/13/2013  . Sleep apnea 04/11/2013  .  Scoliosis 04/11/2013  . Atrial flutter with rapid ventricular response ([place]) 11/29/2012  . Chest pain, atypical 11/29/2012  . Fibromyalgia syndrome 11/29/2012  . Chronic steroid use 11/29/2012    Baruch Merl, PT 09/12/20 11:48 AM   [facility] [address], [place], [place], [address] Phone: [phone]   Fax:  [phone]  Name: Janet Nguyen MRN: [id number] Date of Birth: 06-Jan-1932

## 2020-09-17 ENCOUNTER — Other Ambulatory Visit: Payer: Self-pay

## 2020-09-17 ENCOUNTER — Ambulatory Visit: Payer: Medicare Other | Admitting: Physical Therapy

## 2020-09-17 ENCOUNTER — Encounter: Payer: Self-pay | Admitting: Physical Therapy

## 2020-09-17 DIAGNOSIS — M62830 Muscle spasm of back: Secondary | ICD-10-CM | POA: Diagnosis not present

## 2020-09-17 DIAGNOSIS — M4125 Other idiopathic scoliosis, thoracolumbar region: Secondary | ICD-10-CM

## 2020-09-17 DIAGNOSIS — M6281 Muscle weakness (generalized): Secondary | ICD-10-CM

## 2020-09-17 DIAGNOSIS — M542 Cervicalgia: Secondary | ICD-10-CM

## 2020-09-17 DIAGNOSIS — R293 Abnormal posture: Secondary | ICD-10-CM

## 2020-09-17 NOTE — Therapy (Signed)
[facility] [address], [place], [place], [address] Phone: [phone]   Fax:  [phone]  Physical Therapy Treatment  Patient Details  Name: Janet Nguyen MRN: [id number] Date of Birth: 04-26-1932 Referring Provider (PT): Lawerance Cruel, MD   Encounter Date: 09/17/2020   PT End of Session - 09/17/20 1112    Visit Number 9    Date for PT Re-Evaluation 10/05/20    Authorization Type Medicare - NEEDS KX MODIFIER    Progress Note Due on Visit 10    PT Start Time 1100    PT Stop Time 1142    PT Time Calculation (min) 42 min    Activity Tolerance Patient tolerated treatment well    Behavior During Therapy [facility] for tasks assessed/performed           Past Medical History:  Diagnosis Date  . Arrhythmia    History of SVT with documented PVC'S and  PAC'S  12/08/12 Nuc stress test normal LV EF 74%  Event Monitor  12/01/12-01/03/13  . Atrial flutter ([place])   . Celiac disease    treated by Dr. [name]  . GERD (gastroesophageal reflux disease)   . Intervertebral disc stenosis of neural canal of cervical region   . Irregular heart beat 11/30/12   ECHO-EF 60-65%  . Osteoporosis   . PMR (polymyalgia rheumatica) (HCC)    Dr. Marijean Bravo; pt states she was diagnosed 10-15 years ago, not treated at this time or any issues that she is aware of.  . Scoliosis   . Scoliosis   . Sleep apnea 10/02/11 [facility]   Sleep study AHI -total sleep 10.3/hr  64.0/ hr during REM sleep.RDI 22.8/hr during total sleep 64.0/hr during REM sleep The lowest O2 sat during Non-REM and REM sleep was 86% and 88% respectively. 04/08/12 CPAP/BIPAP titration study [facility]    Past Surgical History:  Procedure Laterality Date  . APPENDECTOMY     ruptured at age 60 and had surgery  . CARDIAC CATHETERIZATION  01/27/06  . cataract surgery  2015   Dr. Herbert Deaner; March & April 2015    There were no vitals filed for  this visit.   Subjective Assessment - 09/17/20 1103    Subjective I have had some bad nights.  I think laying on my back on the treatment table may have bothered my back.  I may need to avoid that.  I have trouble separating the difference from mental and physical health issues - they affect each other.    Pertinent History scoliosis, sleep apnea (no CPAP)    Limitations House hold activities;Lifting    Patient Stated Goals pain relief    Currently in Pain? Yes    Pain Score 6     Pain Location Neck    Pain Orientation Left;Right;Posterior    Pain Type Chronic pain    Pain Onset More than a month ago    Pain Frequency Intermittent    Aggravating Factors  all activity    Pain Relieving Factors meds, heat                             [facility] Adult PT Treatment/Exercise - 09/17/20 0001      Exercises   Exercises Neck;Shoulder;Lumbar;Knee/Hip      Neck Exercises: Machines for Strengthening   Nustep L2 x 6' PT present to discuss status  Manual Therapy   Manual Therapy Soft tissue mobilization    Soft tissue mobilization thoracic paraspinal elongation and broadening bil, bil upper traps    Manual Traction sidelying lumbar sacral and lumbar distraction                    PT Short Term Goals - 09/10/20 1300      PT SHORT TERM GOAL #1   Title Pt will be ind with initial HEP    Baseline doing 2x/day    Status Achieved      PT SHORT TERM GOAL #2   Title Pt will be instructed in spinal decompression series for pain relief and bone support/protection.    Status Achieved      PT SHORT TERM GOAL #3   Title Pt will report 20% reduction in neck pain during the day.    Baseline varies    Status Partially Met             PT Long Term Goals - 09/10/20 1300      PT LONG TERM GOAL #1   Title Pt will improve neck rotation to at least 45 deg bil for improved visibility with driving.    Status On-going      PT LONG TERM GOAL #2   Title Pt will be able  to reach back of head with Lt UE with min pain for improved tolerance of hair washing and styling.    Status On-going      PT LONG TERM GOAL #3   Title Pt will report at least 50% less neck pain with use of HEP, home modalities, postural self-checks and actiivty modification as tools for pain control.    Status On-going      PT LONG TERM GOAL #4   Title Reduce FOTO to </= 44% to demo less limitation    Status New                 Plan - 09/17/20 1112    Clinical Impression Statement Pt continues to have chronic pain in varying levels and locations related to severe degenerative scoliosis.  She had some increased pain following last session which she attributes to laying supine on mat table.  PT noted significant improvement in soft tissue tension bil along neck, thoracic and lumbar spine.  Pt and PT discussed how mental state can magnify experience of pain.  Pt continues to be compliant with HEP and uses Calm App.  Session focused on seated and sidelying manual techniques and Pt education on pain neuroscience education.  She will continue to benefit from skilled PT to work toward ind management of pain and goals to prep for discharge.    PT Frequency 2x / week    PT Duration 12 weeks    PT Treatment/Interventions ADLs/Self Care Home Management;Joint Manipulations;Spinal Manipulations;Manual techniques;Neuromuscular re-education;Therapeutic exercise;Therapeutic activities;Functional mobility training;Moist Heat;Electrical Stimulation;Passive range of motion;Traction    PT Next Visit Plan 10th visit PN next time    PT Home Exercise Plan Access Code: [email or web address]    Consulted and Agree with Plan of Care Patient           Patient will benefit from skilled therapeutic intervention in order to improve the following deficits and impairments:     Visit Diagnosis: Cervicalgia  Muscle weakness (generalized)  Other idiopathic scoliosis, thoracolumbar region  Abnormal posture  Muscle spasm  of back     Problem List Patient Active Problem List   Diagnosis Date Noted  .  Deviated septum 05/08/2020  . Mass of subcutaneous tissue 05/02/2020  . Vertigo 06/16/2019  . Pulmonary hypertension, unspecified ([place]) 04/14/2019  . Ischemic colitis ([place]) 04/14/2019  . Migraine with aura and without status migrainosus, not intractable 11/08/2018  . Degeneration of lumbar intervertebral disc 10/14/2018  . Hoarseness of voice 03/04/2018  . Abdominal pain 07/01/2017  . Diverticulitis, colon   . Metabolic acidosis, increased anion gap   . Constipation 04/14/2017  . Lumbar hernia 04/14/2017  . Presbycusis of both ears 01/10/2017  . Tinnitus aurium, bilateral 01/10/2017  . Gastroesophageal reflux disease 08/20/2016  . Hemoptysis 08/20/2016  . Obstructive sleep apnea of adult 08/20/2016  . Rhinitis, chronic 08/20/2016  . Throat pain in adult 08/20/2016  . Fatigue 12/23/2015  . Sciatica of right side 10/06/2015  . History of migraine headaches 10/06/2015  . Frequent PVCs 12/28/2013  . Premature atrial contractions 12/28/2013  . PSVT (paroxysmal supraventricular tachycardia) ([place]) 12/28/2013  . Heart palpitations 07/13/2013  . Sleep apnea 04/11/2013  . Scoliosis 04/11/2013  . Atrial flutter with rapid ventricular response ([place]) 11/29/2012  . Chest pain, atypical 11/29/2012  . Fibromyalgia syndrome 11/29/2012  . Chronic steroid use 11/29/2012   Baruch Merl, PT 09/17/20 12:41 PM   [facility] [address] [place], [place], [address] Phone: [phone]   Fax:  [phone]  Name: Janet Nguyen MRN: [id number] Date of Birth: 28-Mar-1932

## 2020-09-19 ENCOUNTER — Other Ambulatory Visit: Payer: Self-pay

## 2020-09-19 ENCOUNTER — Ambulatory Visit: Payer: Medicare Other | Admitting: Physical Therapy

## 2020-09-19 ENCOUNTER — Encounter: Payer: Self-pay | Admitting: Physical Therapy

## 2020-09-19 DIAGNOSIS — M6281 Muscle weakness (generalized): Secondary | ICD-10-CM | POA: Diagnosis not present

## 2020-09-19 DIAGNOSIS — H524 Presbyopia: Secondary | ICD-10-CM | POA: Diagnosis not present

## 2020-09-19 DIAGNOSIS — M62830 Muscle spasm of back: Secondary | ICD-10-CM | POA: Diagnosis not present

## 2020-09-19 DIAGNOSIS — R293 Abnormal posture: Secondary | ICD-10-CM

## 2020-09-19 DIAGNOSIS — M4125 Other idiopathic scoliosis, thoracolumbar region: Secondary | ICD-10-CM | POA: Diagnosis not present

## 2020-09-19 DIAGNOSIS — H538 Other visual disturbances: Secondary | ICD-10-CM | POA: Diagnosis not present

## 2020-09-19 DIAGNOSIS — M542 Cervicalgia: Secondary | ICD-10-CM | POA: Diagnosis not present

## 2020-09-19 DIAGNOSIS — H04123 Dry eye syndrome of bilateral lacrimal glands: Secondary | ICD-10-CM | POA: Diagnosis not present

## 2020-09-19 NOTE — Therapy (Signed)
[facility] [address], [place], [place], [address] Phone: [phone]   Fax:  [phone]  Physical Therapy Treatment  Patient Details  Name: Janet Nguyen MRN: [id number] Date of Birth: 1932/06/17 Referring Provider (PT): Lawerance Cruel, MD   Encounter Date: 09/19/2020   Progress Note Reporting Period 07/13/20 to 09/19/20  See note below for Objective Data and Assessment of Progress/Goals.        PT End of Session - 09/19/20 1102    Visit Number 10    Date for PT Re-Evaluation 10/05/20    Authorization Type Medicare - NEEDS KX MODIFIER    Progress Note Due on Visit 10    PT Start Time 1103    PT Stop Time 1145    PT Time Calculation (min) 42 min    Activity Tolerance Patient tolerated treatment well    Behavior During Therapy WFL for tasks assessed/performed           Past Medical History:  Diagnosis Date   Arrhythmia    History of SVT with documented PVC'S and  PAC'S  12/08/12 Nuc stress test normal LV EF 74%  Event Monitor  12/01/12-01/03/13   Atrial flutter (HCC)    Celiac disease    treated by Dr. [name]   GERD (gastroesophageal reflux disease)    Intervertebral disc stenosis of neural canal of cervical region    Irregular heart beat 11/30/12   ECHO-EF 60-65%   Osteoporosis    PMR (polymyalgia rheumatica) (HCC)    Dr. Marijean Bravo; pt states she was diagnosed 10-15 years ago, not treated at this time or any issues that she is aware of.   Scoliosis    Scoliosis    Sleep apnea 10/02/11 [facility]   Sleep study AHI -total sleep 10.3/hr  64.0/ hr during REM sleep.RDI 22.8/hr during total sleep 64.0/hr during REM sleep The lowest O2 sat during Non-REM and REM sleep was 86% and 88% respectively. 04/08/12 CPAP/BIPAP titration study [facility]    Past Surgical History:  Procedure Laterality Date   APPENDECTOMY     ruptured at age 23 and had  surgery   CARDIAC CATHETERIZATION  01/27/06   cataract surgery  2015   Dr. Herbert Deaner; March & April 2015    There were no vitals filed for this visit.   Subjective Assessment - 09/19/20 1118    Subjective My pain is still present but I know PT is helping me be more aware of my posture and manage the pain from worsening.  Pain was bad when I awoke this morning, maybe a 9/10 but now down to 6/10.    Pertinent History scoliosis, sleep apnea (no CPAP)    Limitations House hold activities;Lifting    Patient Stated Goals pain relief    Currently in Pain? Yes    Pain Score 6     Pain Location Neck    Pain Orientation Right;Left;Posterior    Pain Descriptors / Indicators Aching;Tightness    Pain Onset More than a month ago    Pain Frequency Constant              [facility] PT Assessment - 09/19/20 0001      Assessment   Medical Diagnosis M54.2 (ICD-10-CM) - Neck pain    Referring Provider (PT) Lawerance Cruel, MD    Onset Date/Surgical Date --   years   Next MD Visit no    Prior Therapy yes  Precautions   Precautions Other (comment)    Precaution Comments signif scoliosis needs care in positioning for comfort      Observation/Other Assessments   Focus on Therapeutic Outcomes (FOTO)  44% down from 48%, meeting goal      AROM   Cervical Flexion 45    Cervical Extension 40    Cervical - Right Side Bend 25    Cervical - Left Side Bend 30    Cervical - Right Rotation 40    Cervical - Left Rotation 50      Strength   Cervical Flexion 4/5    Cervical Extension 4+/5    Cervical - Right Side Bend 4+/5    Cervical - Left Side Bend 4+/5    Cervical - Right Rotation 4+/5    Cervical - Left Rotation 4+/5                                   PT Short Term Goals - 09/10/20 1300      PT SHORT TERM GOAL #1   Title Pt will be ind with initial HEP    Baseline doing 2x/day    Status Achieved      PT SHORT TERM GOAL #2   Title Pt will be instructed in  spinal decompression series for pain relief and bone support/protection.    Status Achieved      PT SHORT TERM GOAL #3   Title Pt will report 20% reduction in neck pain during the day.    Baseline varies    Status Partially Met             PT Long Term Goals - 09/19/20 1107      PT LONG TERM GOAL #1   Title Pt will improve neck rotation to at least 45 deg bil for improved visibility with driving.    Baseline met for Lt, 40 deg for Rt    Status On-going      PT LONG TERM GOAL #2   Title Pt will be able to reach back of head with Lt UE with min pain for improved tolerance of hair washing and styling.    Status Achieved      PT LONG TERM GOAL #3   Title Pt will report at least 50% less neck pain with use of HEP, home modalities, postural self-checks and actiivty modification as tools for pain control.    Baseline 30% using movement, Tylenol    Status On-going      PT LONG TERM GOAL #4   Title Reduce FOTO to </= 44% to demo less limitation    Baseline 44%    Status Achieved                 Plan - 09/19/20 1236    Clinical Impression Statement Pt reports ongoing chronic pain.  She had heightened neck pain on waking today, 9/10, which had reduced to 6/10 throughout the morning.  She feels she is managing pain with 30% improvement, being able to use her HEP and postural awareness to bring high pain levels down to more managable level.  Her FOTO score reduced from 48% to 44% meeting LTG.  She also has gained significant amount of improved neck mobility, doubling her Lt Rot and Rt SB (50 deg, 25 deg) and gaining smaller amounts of motion in both flexion and extension.  She is standing taller with improved scapular  and cervical strength.  PT and Pt have ongoing discussions about chronic pain and its impact on pain experience.  Pt is aware of goals with PT and has met or is on her way toward meeting remaining [place].  Session spent today using manual techniques given higher pain level  today.  She will continue to benefit from skilled PT with anticipated goal attainment and d/c within current certification period.    PT Frequency 2x / week    PT Duration 12 weeks    PT Treatment/Interventions ADLs/Self Care Home Management;Joint Manipulations;Spinal Manipulations;Manual techniques;Neuromuscular re-education;Therapeutic exercise;Therapeutic activities;Functional mobility training;Moist Heat;Electrical Stimulation;Passive range of motion;Traction    PT Next Visit Plan continue postural re-ed, strength, manual techniques    PT Home Exercise Plan Access Code: [email or web address]    Consulted and Agree with Plan of Care Patient           Patient will benefit from skilled therapeutic intervention in order to improve the following deficits and impairments:     Visit Diagnosis: Cervicalgia  Muscle weakness (generalized)  Other idiopathic scoliosis, thoracolumbar region  Abnormal posture  Muscle spasm of back     Problem List Patient Active Problem List   Diagnosis Date Noted   Deviated septum 05/08/2020   Mass of subcutaneous tissue 05/02/2020   Vertigo 06/16/2019   Pulmonary hypertension, unspecified ([place]) 04/14/2019   Ischemic colitis ([place]) 04/14/2019   Migraine with aura and without status migrainosus, not intractable 11/08/2018   Degeneration of lumbar intervertebral disc 10/14/2018   Hoarseness of voice 03/04/2018   Abdominal pain 07/01/2017   Diverticulitis, colon    Metabolic acidosis, increased anion gap    Constipation 04/14/2017   Lumbar hernia 04/14/2017   Presbycusis of both ears 01/10/2017   Tinnitus aurium, bilateral 01/10/2017   Gastroesophageal reflux disease 08/20/2016   Hemoptysis 08/20/2016   Obstructive sleep apnea of adult 08/20/2016   Rhinitis, chronic 08/20/2016   Throat pain in adult 08/20/2016   Fatigue 12/23/2015   Sciatica of right side 10/06/2015   History of migraine headaches 10/06/2015   Frequent PVCs  12/28/2013   Premature atrial contractions 12/28/2013   PSVT (paroxysmal supraventricular tachycardia) ([place]) 12/28/2013   Heart palpitations 07/13/2013   Sleep apnea 04/11/2013   Scoliosis 04/11/2013   Atrial flutter with rapid ventricular response ([place]) 11/29/2012   Chest pain, atypical 11/29/2012   Fibromyalgia syndrome 11/29/2012   Chronic steroid use 11/29/2012    Baruch Merl, PT 09/19/20 12:43 PM   [facility] [address], [place], [place], [address] Phone: [phone]   Fax:  [phone]  Name: Janet Nguyen MRN: [id number] Date of Birth: 09-29-1932

## 2020-09-24 ENCOUNTER — Encounter: Payer: Self-pay | Admitting: Physical Therapy

## 2020-09-24 ENCOUNTER — Other Ambulatory Visit: Payer: Self-pay

## 2020-09-24 ENCOUNTER — Ambulatory Visit: Payer: Medicare Other | Admitting: Physical Therapy

## 2020-09-24 DIAGNOSIS — M6281 Muscle weakness (generalized): Secondary | ICD-10-CM | POA: Diagnosis not present

## 2020-09-24 DIAGNOSIS — M542 Cervicalgia: Secondary | ICD-10-CM

## 2020-09-24 DIAGNOSIS — M4125 Other idiopathic scoliosis, thoracolumbar region: Secondary | ICD-10-CM

## 2020-09-24 DIAGNOSIS — R293 Abnormal posture: Secondary | ICD-10-CM

## 2020-09-24 DIAGNOSIS — M62830 Muscle spasm of back: Secondary | ICD-10-CM | POA: Diagnosis not present

## 2020-09-24 NOTE — Therapy (Signed)
[facility] [address], [place], [place], [address] Phone: [phone]   Fax:  [phone]  Physical Therapy Treatment  Patient Details  Name: Janet Nguyen MRN: [id number] Date of Birth: May 17, 1932 Referring Provider (PT): Lawerance Cruel, MD   Encounter Date: 09/24/2020   PT End of Session - 09/24/20 1140    Visit Number 11    Date for PT Re-Evaluation 10/05/20    Authorization Type Medicare - NEEDS KX MODIFIER    Progress Note Due on Visit 20    PT Start Time 1100    PT Stop Time 1140    PT Time Calculation (min) 40 min    Activity Tolerance Patient tolerated treatment well    Behavior During Therapy [facility] for tasks assessed/performed           Past Medical History:  Diagnosis Date   Arrhythmia    History of SVT with documented PVC'S and  PAC'S  12/08/12 Nuc stress test normal LV EF 74%  Event Monitor  12/01/12-01/03/13   Atrial flutter ([place])    Celiac disease    treated by Dr. [name]   GERD (gastroesophageal reflux disease)    Intervertebral disc stenosis of neural canal of cervical region    Irregular heart beat 11/30/12   ECHO-EF 60-65%   Osteoporosis    PMR (polymyalgia rheumatica) (HCC)    Dr. Marijean Bravo; pt states she was diagnosed 10-15 years ago, not treated at this time or any issues that she is aware of.   Scoliosis    Scoliosis    Sleep apnea 10/02/11 [facility]   Sleep study AHI -total sleep 10.3/hr  64.0/ hr during REM sleep.RDI 22.8/hr during total sleep 64.0/hr during REM sleep The lowest O2 sat during Non-REM and REM sleep was 86% and 88% respectively. 04/08/12 [facility]    Past Surgical History:  Procedure Laterality Date   APPENDECTOMY     ruptured at age 36 and had surgery   CARDIAC CATHETERIZATION  01/27/06   cataract surgery  2015   Dr. Herbert Deaner; March & April 2015    There were no vitals filed  for this visit.   Subjective Assessment - 09/24/20 1103    Subjective I am exhausted.  Pt declines exercises initially due to signif fatigue from all of the holiday stimulation with family.    Pertinent History scoliosis, sleep apnea (no CPAP)    Patient Stated Goals pain relief    Currently in Pain? Yes    Pain Score 7     Pain Location Back    Pain Orientation Left    Pain Descriptors / Indicators Aching;Tightness    Pain Type Chronic pain    Pain Radiating Towards all along back to hip    Pain Onset More than a month ago    Pain Frequency Constant    Aggravating Factors  all activity    Pain Relieving Factors meds, heat                             [facility] Adult PT Treatment/Exercise - 09/24/20 0001      Manual Therapy   Manual Therapy Soft tissue mobilization;Scapular mobilization;Myofascial release    Soft tissue mobilization Lt QL, upper trap, levator, periscapular    Myofascial Release along ribcage and Lt lower quadrant    Scapular Mobilization Lt PNF, passive, retraction/depression, protraction, elevation  Manual Traction sidelying lumbar sacral and lumbar distraction                    PT Short Term Goals - 09/10/20 1300      PT SHORT TERM GOAL #1   Title Pt will be ind with initial HEP    Baseline doing 2x/day    Status Achieved      PT SHORT TERM GOAL #2   Title Pt will be instructed in spinal decompression series for pain relief and bone support/protection.    Status Achieved      PT SHORT TERM GOAL #3   Title Pt will report 20% reduction in neck pain during the day.    Baseline varies    Status Partially Met             PT Long Term Goals - 09/19/20 1107      PT LONG TERM GOAL #1   Title Pt will improve neck rotation to at least 45 deg bil for improved visibility with driving.    Baseline met for Lt, 40 deg for Rt    Status On-going      PT LONG TERM GOAL #2   Title Pt will be able to reach back of head with Lt UE  with min pain for improved tolerance of hair washing and styling.    Status Achieved      PT LONG TERM GOAL #3   Title Pt will report at least 50% less neck pain with use of HEP, home modalities, postural self-checks and actiivty modification as tools for pain control.    Baseline 30% using movement, Tylenol    Status On-going      PT LONG TERM GOAL #4   Title Reduce FOTO to </= 44% to demo less limitation    Baseline 44%    Status Achieved                 Plan - 09/24/20 1142    Clinical Impression Statement Pt with signif fatigue after 2 days of gatherings with family for the holidays on top of not sleeping well.  She did not feel up to ther ex today so session focused on manual techniques along trunk for elongation and release of soft tissues.  Pt continues to display less muscle spasms and improved response of tissues to release and elongation.  PT added scapular mobs on Lt secondary to tension input from lats and pecs with good tolerance.  Pt will continue HEP and walking with hopes to feel more energized after a rest day today.  Continue along POC.    PT Frequency 2x / week    PT Duration 12 weeks    PT Treatment/Interventions ADLs/Self Care Home Management;Joint Manipulations;Spinal Manipulations;Manual techniques;Neuromuscular re-education;Therapeutic exercise;Therapeutic activities;Functional mobility training;Moist Heat;Electrical Stimulation;Passive range of motion;Traction    PT Next Visit Plan continue postural re-ed, strength, manual techniques    PT Home Exercise Plan Access Code: [email or web address]    Consulted and Agree with Plan of Care Patient           Patient will benefit from skilled therapeutic intervention in order to improve the following deficits and impairments:     Visit Diagnosis: Cervicalgia  Muscle weakness (generalized)  Other idiopathic scoliosis, thoracolumbar region  Abnormal posture     Problem List Patient Active Problem List   Diagnosis  Date Noted   Deviated septum 05/08/2020   Mass of subcutaneous tissue 05/02/2020   Vertigo 06/16/2019  Pulmonary hypertension, unspecified ([place]) 04/14/2019   Ischemic colitis ([place]) 04/14/2019   Migraine with aura and without status migrainosus, not intractable 11/08/2018   Degeneration of lumbar intervertebral disc 10/14/2018   Hoarseness of voice 03/04/2018   Abdominal pain 07/01/2017   Diverticulitis, colon    Metabolic acidosis, increased anion gap    Constipation 04/14/2017   Lumbar hernia 04/14/2017   Presbycusis of both ears 01/10/2017   Tinnitus aurium, bilateral 01/10/2017   Gastroesophageal reflux disease 08/20/2016   Hemoptysis 08/20/2016   Obstructive sleep apnea of adult 08/20/2016   Rhinitis, chronic 08/20/2016   Throat pain in adult 08/20/2016   Fatigue 12/23/2015   Sciatica of right side 10/06/2015   History of migraine headaches 10/06/2015   Frequent PVCs 12/28/2013   Premature atrial contractions 12/28/2013   PSVT (paroxysmal supraventricular tachycardia) ([place]) 12/28/2013   Heart palpitations 07/13/2013   Sleep apnea 04/11/2013   Scoliosis 04/11/2013   Atrial flutter with rapid ventricular response ([place]) 11/29/2012   Chest pain, atypical 11/29/2012   Fibromyalgia syndrome 11/29/2012   Chronic steroid use 11/29/2012    Baruch Merl, PT 09/24/20 11:46 AM   [facility] [address] [place], [place], [address] Phone: [phone]   Fax:  [phone]  Name: Janet Nguyen MRN: [id number] Date of Birth: 04/01/1932

## 2020-09-26 ENCOUNTER — Ambulatory Visit: Payer: Medicare Other | Admitting: Physical Therapy

## 2020-10-01 ENCOUNTER — Ambulatory Visit: Payer: Medicare Other | Admitting: Physical Therapy

## 2020-10-03 ENCOUNTER — Encounter: Payer: Self-pay | Admitting: Physical Therapy

## 2020-10-03 ENCOUNTER — Other Ambulatory Visit: Payer: Self-pay

## 2020-10-03 ENCOUNTER — Ambulatory Visit: Payer: Medicare Other | Attending: Family Medicine | Admitting: Physical Therapy

## 2020-10-03 DIAGNOSIS — M419 Scoliosis, unspecified: Secondary | ICD-10-CM | POA: Diagnosis not present

## 2020-10-03 DIAGNOSIS — M4125 Other idiopathic scoliosis, thoracolumbar region: Secondary | ICD-10-CM | POA: Insufficient documentation

## 2020-10-03 DIAGNOSIS — R293 Abnormal posture: Secondary | ICD-10-CM | POA: Insufficient documentation

## 2020-10-03 DIAGNOSIS — H6980 Other specified disorders of Eustachian tube, unspecified ear: Secondary | ICD-10-CM | POA: Diagnosis not present

## 2020-10-03 DIAGNOSIS — M6281 Muscle weakness (generalized): Secondary | ICD-10-CM | POA: Insufficient documentation

## 2020-10-03 DIAGNOSIS — K589 Irritable bowel syndrome without diarrhea: Secondary | ICD-10-CM | POA: Diagnosis not present

## 2020-10-03 DIAGNOSIS — Z8619 Personal history of other infectious and parasitic diseases: Secondary | ICD-10-CM | POA: Diagnosis not present

## 2020-10-03 DIAGNOSIS — M62830 Muscle spasm of back: Secondary | ICD-10-CM | POA: Diagnosis not present

## 2020-10-03 DIAGNOSIS — M542 Cervicalgia: Secondary | ICD-10-CM | POA: Diagnosis not present

## 2020-10-03 DIAGNOSIS — R42 Dizziness and giddiness: Secondary | ICD-10-CM | POA: Diagnosis not present

## 2020-10-03 NOTE — Therapy (Signed)
[facility] [address], [place], [place], [address] Phone: [phone]   Fax:  [phone]  Physical Therapy Treatment  Patient Details  Name: Janet Nguyen MRN: [id number] Date of Birth: 1932-03-10 Referring Provider (PT): Lawerance Cruel, MD   Encounter Date: 10/03/2020   PT End of Session - 10/03/20 1116    Visit Number 12    Date for PT Re-Evaluation 10/05/20    Authorization Type Medicare - NEEDS KX MODIFIER    Progress Note Due on Visit 20    PT Start Time 1105    PT Stop Time 1139    PT Time Calculation (min) 34 min    Activity Tolerance Patient tolerated treatment well    Behavior During Therapy [facility] for tasks assessed/performed           Past Medical History:  Diagnosis Date  . Arrhythmia    History of SVT with documented PVC'S and  PAC'S  12/08/12 Nuc stress test normal LV EF 74%  Event Monitor  12/01/12-01/03/13  . Atrial flutter ([place])   . Celiac disease    treated by Dr. [name]  . GERD (gastroesophageal reflux disease)   . Intervertebral disc stenosis of neural canal of cervical region   . Irregular heart beat 11/30/12   ECHO-EF 60-65%  . Osteoporosis   . PMR (polymyalgia rheumatica) (HCC)    Dr. Marijean Bravo; pt states she was diagnosed 10-15 years ago, not treated at this time or any issues that she is aware of.  . Scoliosis   . Scoliosis   . Sleep apnea 10/02/11 [facility]   Sleep study AHI -total sleep 10.3/hr  64.0/ hr during REM sleep.RDI 22.8/hr during total sleep 64.0/hr during REM sleep The lowest O2 sat during Non-REM and REM sleep was 86% and 88% respectively. 04/08/12 CPAP/BIPAP titration study [facility]    Past Surgical History:  Procedure Laterality Date  . APPENDECTOMY     ruptured at age 33 and had surgery  . CARDIAC CATHETERIZATION  01/27/06  . cataract surgery  2015   Dr. Herbert Deaner; March & April 2015    There were no vitals filed for  this visit.   Subjective Assessment - 10/03/20 1105    Subjective I had an episode of vertigo last week and am trying to self-treat which has helped somewhat.  I used to get PT for this.  I had a bad week last week with back pain.  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  It was deeper than usual.  I do appreciate the HEP and do them up until last week with my vertigo set back.    Pertinent History scoliosis, sleep apnea (no CPAP)    Limitations House hold activities;Lifting    Patient Stated Goals pain relief    Currently in Pain? Yes    Pain Score 5     Pain Location Neck    Pain Orientation Right;Left    Pain Descriptors / Indicators Aching;Tightness    Pain Type Chronic pain    Pain Onset More than a month ago    Pain Frequency Intermittent    Aggravating Factors  having to do my Eply manuever for vertigo              [facility] PT Assessment - 10/03/20 0001      Assessment   Medical Diagnosis M54.2 (ICD-10-CM) - Neck pain    Referring Provider (PT)  Lawerance Cruel, MD    Onset Date/Surgical Date --   years   Next MD Visit no    Prior Therapy yes       Precautions   Precautions Other (comment)    Precaution Comments signif scoliosis needs care in positioning for comfort      Observation/Other Assessments   Focus on Therapeutic Outcomes (FOTO)  44% down from 48%, meeting goal      AROM   Cervical Flexion 45    Cervical Extension 40    Cervical - Right Side Bend 25    Cervical - Left Side Bend 30    Cervical - Right Rotation 40    Cervical - Left Rotation 50      Strength   Cervical Flexion 4/5    Cervical Extension 4+/5    Cervical - Right Side Bend 4+/5    Cervical - Left Side Bend 4+/5    Cervical - Right Rotation 4+/5    Cervical - Left Rotation 4+/5                         [facility] Adult PT Treatment/Exercise - 10/03/20 0001      Exercises   Exercises Neck      Neck Exercises: Seated   Cervical Rotation Right;Left;5 reps     Cervical Rotation Limitations in retraction    Shoulder Rolls Limitations scap squeeze 5x5 sec      Manual Therapy   Manual Therapy Soft tissue mobilization    Soft tissue mobilization bil upper traps, cervical paraspinals, pectorals                    PT Short Term Goals - 09/10/20 1300      PT SHORT TERM GOAL #1   Title Pt will be ind with initial HEP    Baseline doing 2x/day    Status Achieved      PT SHORT TERM GOAL #2   Title Pt will be instructed in spinal decompression series for pain relief and bone support/protection.    Status Achieved      PT SHORT TERM GOAL #3   Title Pt will report 20% reduction in neck pain during the day.    Baseline varies    Status Partially Met             PT Long Term Goals - 10/03/20 1116      PT LONG TERM GOAL #1   Title Pt will improve neck rotation to at least 45 deg bil for improved visibility with driving.    Status Partially Met      PT LONG TERM GOAL #2   Title Pt will be able to reach back of head with Lt UE with min pain for improved tolerance of hair washing and styling.    Status Achieved      PT LONG TERM GOAL #3   Title Pt will report at least 50% less neck pain with use of HEP, home modalities, postural self-checks and actiivty modification as tools for pain control.    Status Partially Met      PT LONG TERM GOAL #4   Title Reduce FOTO to </= 44% to demo less limitation    Baseline 44%    Status Achieved                 Plan - 10/03/20 1139    Clinical Impression Statement Pt has met or partially  met all LTGs. She has chronic pain related to scoliosis but has implemented a HEP that addresses posture and postural stabilization cues.  She feels ready to manage on her own for a while. She has had recent flare up of vertigo for which she may need to seek MD and PT care for.  FOTO score reduced to 44% meeting LTG.  She has much improved cervical ROM for improved visibility with driving.  She is ready to  d/c to HEP.    Rehab Potential Good    PT Frequency 2x / week    PT Duration 12 weeks    PT Treatment/Interventions ADLs/Self Care Home Management;Joint Manipulations;Spinal Manipulations;Manual techniques;Neuromuscular re-education;Therapeutic exercise;Therapeutic activities;Functional mobility training;Moist Heat;Electrical Stimulation;Passive range of motion;Traction    PT Next Visit Plan d/c to HEP    PT Home Exercise Plan Access Code: [email or web address]    Consulted and Agree with Plan of Care Patient           Patient will benefit from skilled therapeutic intervention in order to improve the following deficits and impairments:     Visit Diagnosis: Cervicalgia  Muscle weakness (generalized)  Other idiopathic scoliosis, thoracolumbar region  Abnormal posture  Muscle spasm of back     Problem List Patient Active Problem List   Diagnosis Date Noted  . Deviated septum 05/08/2020  . Mass of subcutaneous tissue 05/02/2020  . Vertigo 06/16/2019  . Pulmonary hypertension, unspecified ([place]) 04/14/2019  . Ischemic colitis ([place]) 04/14/2019  . Migraine with aura and without status migrainosus, not intractable 11/08/2018  . Degeneration of lumbar intervertebral disc 10/14/2018  . Hoarseness of voice 03/04/2018  . Abdominal pain 07/01/2017  . Diverticulitis, colon   . Metabolic acidosis, increased anion gap   . Constipation 04/14/2017  . Lumbar hernia 04/14/2017  . Presbycusis of both ears 01/10/2017  . Tinnitus aurium, bilateral 01/10/2017  . Gastroesophageal reflux disease 08/20/2016  . Hemoptysis 08/20/2016  . Obstructive sleep apnea of adult 08/20/2016  . Rhinitis, chronic 08/20/2016  . Throat pain in adult 08/20/2016  . Fatigue 12/23/2015  . Sciatica of right side 10/06/2015  . History of migraine headaches 10/06/2015  . Frequent PVCs 12/28/2013  . Premature atrial contractions 12/28/2013  . PSVT (paroxysmal supraventricular tachycardia) ([place]) 12/28/2013  . Heart  palpitations 07/13/2013  . Sleep apnea 04/11/2013  . Scoliosis 04/11/2013  . Atrial flutter with rapid ventricular response ([place]) 11/29/2012  . Chest pain, atypical 11/29/2012  . Fibromyalgia syndrome 11/29/2012  . Chronic steroid use 11/29/2012    PHYSICAL THERAPY DISCHARGE SUMMARY  Visits from Start of Care: 12  Current functional level related to goals / functional outcomes: See above   Remaining deficits: See above   Education / Equipment: HEP Plan: Patient agrees to discharge.  Patient goals were partially met. Patient is being discharged due to being pleased with the current functional level.  ?????         Baruch Merl, PT 10/03/20 11:42 AM   [facility] [address], [place], [place], [address] Phone: [phone]   Fax:  [phone]  Name: Janet Nguyen MRN: [id number] Date of Birth: [birth year]

## 2020-10-24 DIAGNOSIS — H538 Other visual disturbances: Secondary | ICD-10-CM | POA: Diagnosis not present

## 2020-10-24 DIAGNOSIS — H353132 Nonexudative age-related macular degeneration, bilateral, intermediate dry stage: Secondary | ICD-10-CM | POA: Diagnosis not present

## 2020-10-24 DIAGNOSIS — H35372 Puckering of macula, left eye: Secondary | ICD-10-CM | POA: Diagnosis not present

## 2020-10-24 DIAGNOSIS — H40013 Open angle with borderline findings, low risk, bilateral: Secondary | ICD-10-CM | POA: Diagnosis not present

## 2020-11-14 DIAGNOSIS — R32 Unspecified urinary incontinence: Secondary | ICD-10-CM | POA: Diagnosis not present

## 2020-11-14 DIAGNOSIS — K219 Gastro-esophageal reflux disease without esophagitis: Secondary | ICD-10-CM | POA: Diagnosis not present

## 2020-11-14 DIAGNOSIS — R109 Unspecified abdominal pain: Secondary | ICD-10-CM | POA: Diagnosis not present

## 2020-11-14 DIAGNOSIS — R159 Full incontinence of feces: Secondary | ICD-10-CM | POA: Diagnosis not present

## 2020-11-21 DIAGNOSIS — M25551 Pain in right hip: Secondary | ICD-10-CM | POA: Diagnosis not present

## 2020-11-21 DIAGNOSIS — M419 Scoliosis, unspecified: Secondary | ICD-10-CM | POA: Diagnosis not present

## 2020-11-21 DIAGNOSIS — B370 Candidal stomatitis: Secondary | ICD-10-CM | POA: Diagnosis not present

## 2020-11-21 DIAGNOSIS — F419 Anxiety disorder, unspecified: Secondary | ICD-10-CM | POA: Diagnosis not present

## 2020-11-21 DIAGNOSIS — K589 Irritable bowel syndrome without diarrhea: Secondary | ICD-10-CM | POA: Diagnosis not present

## 2020-11-21 DIAGNOSIS — R0781 Pleurodynia: Secondary | ICD-10-CM | POA: Diagnosis not present

## 2020-11-25 ENCOUNTER — Other Ambulatory Visit: Payer: Self-pay | Admitting: Cardiovascular Disease

## 2020-12-03 ENCOUNTER — Ambulatory Visit: Payer: Medicare Other | Admitting: Neurology

## 2020-12-10 DIAGNOSIS — G4733 Obstructive sleep apnea (adult) (pediatric): Secondary | ICD-10-CM | POA: Diagnosis not present

## 2020-12-10 DIAGNOSIS — J342 Deviated nasal septum: Secondary | ICD-10-CM | POA: Diagnosis not present

## 2020-12-10 DIAGNOSIS — K117 Disturbances of salivary secretion: Secondary | ICD-10-CM | POA: Diagnosis not present

## 2020-12-10 DIAGNOSIS — G43109 Migraine with aura, not intractable, without status migrainosus: Secondary | ICD-10-CM | POA: Diagnosis not present

## 2020-12-12 DIAGNOSIS — M4186 Other forms of scoliosis, lumbar region: Secondary | ICD-10-CM | POA: Diagnosis not present

## 2020-12-12 DIAGNOSIS — M25551 Pain in right hip: Secondary | ICD-10-CM | POA: Diagnosis not present

## 2020-12-12 DIAGNOSIS — M25552 Pain in left hip: Secondary | ICD-10-CM | POA: Diagnosis not present

## 2020-12-26 ENCOUNTER — Ambulatory Visit: Payer: Medicare Other | Attending: Family Medicine

## 2020-12-26 ENCOUNTER — Other Ambulatory Visit: Payer: Self-pay

## 2020-12-26 DIAGNOSIS — M4125 Other idiopathic scoliosis, thoracolumbar region: Secondary | ICD-10-CM | POA: Insufficient documentation

## 2020-12-26 DIAGNOSIS — M542 Cervicalgia: Secondary | ICD-10-CM | POA: Diagnosis not present

## 2020-12-26 DIAGNOSIS — R293 Abnormal posture: Secondary | ICD-10-CM | POA: Insufficient documentation

## 2020-12-26 DIAGNOSIS — G8929 Other chronic pain: Secondary | ICD-10-CM | POA: Diagnosis not present

## 2020-12-26 DIAGNOSIS — M545 Low back pain, unspecified: Secondary | ICD-10-CM | POA: Insufficient documentation

## 2020-12-26 DIAGNOSIS — M6281 Muscle weakness (generalized): Secondary | ICD-10-CM | POA: Diagnosis not present

## 2020-12-26 DIAGNOSIS — M62830 Muscle spasm of back: Secondary | ICD-10-CM | POA: Diagnosis not present

## 2020-12-26 NOTE — Therapy (Signed)
[facility] [address], [place], [place], [address] Phone: [phone]   Fax:  [phone]  Physical Therapy Evaluation  Patient Details  Name: Janet Nguyen MRN: [id number] Date of Birth: Mar 22, 1932 Referring Provider (PT): Lawerance Cruel, MD   Encounter Date: 12/26/2020   PT End of Session - 12/26/20 1314    Visit Number 1    Date for PT Re-Evaluation 02/20/21    Authorization Type Medicare  KX when needed    Progress Note Due on Visit 10    PT Start Time 1233    PT Stop Time 1313    PT Time Calculation (min) 40 min    Activity Tolerance Patient tolerated treatment well    Behavior During Therapy [facility] for tasks assessed/performed           Past Medical History:  Diagnosis Date  . Arrhythmia    History of SVT with documented PVC'S and  PAC'S  12/08/12 Nuc stress test normal LV EF 74%  Event Monitor  12/01/12-01/03/13  . Atrial flutter ([place])   . Celiac disease    treated by Dr. [name]  . GERD (gastroesophageal reflux disease)   . Intervertebral disc stenosis of neural canal of cervical region   . Irregular heart beat 11/30/12   ECHO-EF 60-65%  . Osteoporosis   . PMR (polymyalgia rheumatica) (HCC)    Dr. Marijean Bravo; pt states she was diagnosed 10-15 years ago, not treated at this time or any issues that she is aware of.  . Scoliosis   . Scoliosis   . Sleep apnea 10/02/11 [facility]   Sleep study AHI -total sleep 10.3/hr  64.0/ hr during REM sleep.RDI 22.8/hr during total sleep 64.0/hr during REM sleep The lowest O2 sat during Non-REM and REM sleep was 86% and 88% respectively. 04/08/12 CPAP/BIPAP titration study [facility]    Past Surgical History:  Procedure Laterality Date  . APPENDECTOMY     ruptured at age 34 and had surgery  . CARDIAC CATHETERIZATION  01/27/06  . cataract surgery  2015   Dr. Herbert Deaner; March & April 2015    There were no vitals filed for  this visit.    Subjective Assessment - 12/26/20 1240    Subjective Pt presents to PT with neck and Lt rib pain due to chronic scolosis and progression of curvature.  Pt was discharged from this clinic in January with HEP.  Pt reports that she is not able to do these exercises as frequently as she could when she was younger.    Pertinent History scoliosis, sleep apnea (no CPAP)    Patient Stated Goals pain relief    Currently in Pain? Yes    Pain Score 8     Pain Location Neck    Pain Orientation Right;Left    Pain Descriptors / Indicators Aching;Tightness    Pain Type Chronic pain    Pain Onset More than a month ago    Pain Frequency Intermittent    Aggravating Factors  "living"    Pain Relieving Factors meds, heat              [facility] PT Assessment - 12/26/20 0001      Assessment   Medical Diagnosis scoliosis, Lt rib pain, chronic spine pain    Referring Provider (PT) Lawerance Cruel, MD    Onset Date/Surgical Date --   years- chronic     Precautions   Precautions Other (comment)  Precaution Comments signif scoliosis needs care in positioning for comfort      Balance Screen   Has the patient fallen in the past 6 months No    Has the patient had a decrease in activity level because of a fear of falling?  No    Is the patient reluctant to leave their home because of a fear of falling?  No      Home Environment   Living Environment Private residence    Type of Home House      Prior Function   Level of Independence Independent      Cognition   Overall Cognitive Status Within Functional Limits for tasks assessed      Observation/Other Assessments   Focus on Therapeutic Outcomes (FOTO)  61 (goal is 60)      Posture/Postural Control   Posture/Postural Control Postural limitations    Postural Limitations Rounded Shoulders;Forward head;Flexed trunk    Posture Comments significant scoliosis      AROM   Cervical Flexion 55    Cervical - Right Side Bend 25     Cervical - Left Side Bend 40    Cervical - Right Rotation 40    Cervical - Left Rotation 40      Strength   Cervical Flexion 4/5    Cervical Extension 4+/5    Cervical - Right Side Bend 4+/5    Cervical - Left Side Bend 4+/5    Cervical - Right Rotation 4+/5    Cervical - Left Rotation 4+/5                      Objective measurements completed on examination: See above findings.               PT Education - 12/26/20 1315    Education Details discussion regarding previous HEP, posture and lateral stretching away from Lt side    Person(s) Educated Patient    Methods Explanation;Demonstration    Comprehension Verbalized understanding            PT Short Term Goals - 12/26/20 1238      PT SHORT TERM GOAL #1   Title Pt will be ind with initial HEP    Baseline -    Time 4    Period Weeks    Status New    Target Date 01/23/21      PT SHORT TERM GOAL #2   Title Pt will be instructed in spinal decompression series for pain relief and bone support/protection.    Time 4    Period Weeks    Status New    Target Date 01/23/21      PT SHORT TERM GOAL #3   Title Pt will report 20% reduction in Lt rib and lumbar/neck during the day.    Baseline --    Time 4    Period Weeks    Status New    Target Date 01/23/21             PT Long Term Goals - 12/26/20 1300      PT LONG TERM GOAL #1   Title be independent in advanced HEP    Baseline --    Time 8    Period Weeks    Status New    Target Date 02/20/21      PT LONG TERM GOAL #2   Title report use of pain managemt strategies and position changes during daily tasks to improve posture  and postural endurance    Time --    Period --    Status New    Target Date 02/20/21      PT LONG TERM GOAL #3   Title Pt will report at least 50% less neck pain with use of HEP, home modalities, postural self-checks and actiivty modification as tools for pain control.    Baseline --      PT LONG TERM GOAL #4    Title --                  Plan - 12/26/20 1301    Clinical Impression Statement Pt is a 87 y.o. female returning Pt with diagnosis of Lt rib, neck and lumbar pain.  Pt was discharged in January after treatment for neck and thoracic pain to a HEP that addressed posture, flexibility and postural stabilization cues.  Pt reports that she has not been doing her exercises like she used to do when she was younger.  She has neck, thoracic and low back pain related to severe degenerative scoliosis.  Pt also has osteoporosis and fibromyalgia. She has significant postural restrictions including rounded thoracic spine, shoulders and forward head.  Pt rates that her neck and back pain are constant and up to 8/10 at worst.  Pt is not able to report what aggravates her pain.  Not much helps the pain and she uses heat in the evening.   Pt with diffuse palpable pain, tension and trigger points in neck, thoracis and lumbar spine.  Cervical A/ROM is limited into rotation and sidebending and this is baseline for this patient.  She will benefit from skilled PT and postural therapeutic exercise to address her pain and limitations.    Personal Factors and Comorbidities Age;Time since onset of injury/illness/exacerbation;Comorbidity 3+    Comorbidities fibromyalgia, scoliosis, chronic neck pain    Examination-Activity Limitations Lift;Reach Overhead;Bend;Hygiene/Grooming    Examination-Participation Restrictions Yard Work;Meal Prep;Cleaning;Community Activity;Laundry;Shop    Stability/Clinical Decision Making Evolving/Moderate complexity    Clinical Decision Making Moderate    Rehab Potential Good    PT Frequency 2x / week    PT Duration 8 weeks    PT Treatment/Interventions ADLs/Self Care Home Management;Joint Manipulations;Spinal Manipulations;Manual techniques;Neuromuscular re-education;Therapeutic exercise;Therapeutic activities;Functional mobility training;Moist Heat;Electrical Stimulation;Passive range of  motion;Traction;Dry needling;Patient/family education;Cryotherapy    PT Next Visit Plan work on postural strength to pt tolerance, manual as indicated    PT Home Exercise Plan Access Code: [email or web address]    Consulted and Agree with Plan of Care Patient           Patient will benefit from skilled therapeutic intervention in order to improve the following deficits and impairments:  Postural dysfunction,Decreased strength,Decreased mobility,Impaired flexibility,Hypomobility,Pain,Increased muscle spasms,Increased fascial restricitons,Decreased range of motion,Decreased activity tolerance  Visit Diagnosis: Muscle weakness (generalized) - Plan: PT plan of care cert/re-cert  Other idiopathic scoliosis, thoracolumbar region - Plan: PT plan of care cert/re-cert  Abnormal posture - Plan: PT plan of care cert/re-cert  Muscle spasm of back - Plan: PT plan of care cert/re-cert  Chronic bilateral low back pain without sciatica - Plan: PT plan of care cert/re-cert  Cervicalgia - Plan: PT plan of care cert/re-cert     Problem List Patient Active Problem List   Diagnosis Date Noted  . Deviated septum 05/08/2020  . Mass of subcutaneous tissue 05/02/2020  . Vertigo 06/16/2019  . Pulmonary hypertension, unspecified ([place]) 04/14/2019  . Ischemic colitis ([place]) 04/14/2019  . Migraine with aura and without status migrainosus,  not intractable 11/08/2018  . Degeneration of lumbar intervertebral disc 10/14/2018  . Hoarseness of voice 03/04/2018  . Abdominal pain 07/01/2017  . Diverticulitis, colon   . Metabolic acidosis, increased anion gap   . Constipation 04/14/2017  . Lumbar hernia 04/14/2017  . Presbycusis of both ears 01/10/2017  . Tinnitus aurium, bilateral 01/10/2017  . Gastroesophageal reflux disease 08/20/2016  . Hemoptysis 08/20/2016  . Obstructive sleep apnea of adult 08/20/2016  . Rhinitis, chronic 08/20/2016  . Throat pain in adult 08/20/2016  . Fatigue 12/23/2015  .  Sciatica of right side 10/06/2015  . History of migraine headaches 10/06/2015  . Frequent PVCs 12/28/2013  . Premature atrial contractions 12/28/2013  . PSVT (paroxysmal supraventricular tachycardia) ([place]) 12/28/2013  . Heart palpitations 07/13/2013  . Sleep apnea 04/11/2013  . Scoliosis 04/11/2013  . Atrial flutter with rapid ventricular response ([place]) 11/29/2012  . Chest pain, atypical 11/29/2012  . Fibromyalgia syndrome 11/29/2012  . Chronic steroid use 11/29/2012     Sigurd Sos, PT 12/26/20 1:21 PM  [facility] [address] [place], [place], [address] Phone: [phone]   Fax:  [phone]  Name: Janet Nguyen MRN: [id number] Date of Birth: [birth year]

## 2021-01-03 ENCOUNTER — Ambulatory Visit: Payer: Medicare Other | Attending: Family Medicine

## 2021-01-03 ENCOUNTER — Other Ambulatory Visit: Payer: Self-pay

## 2021-01-03 DIAGNOSIS — M4125 Other idiopathic scoliosis, thoracolumbar region: Secondary | ICD-10-CM | POA: Diagnosis not present

## 2021-01-03 DIAGNOSIS — M545 Low back pain, unspecified: Secondary | ICD-10-CM | POA: Diagnosis not present

## 2021-01-03 DIAGNOSIS — R293 Abnormal posture: Secondary | ICD-10-CM | POA: Insufficient documentation

## 2021-01-03 DIAGNOSIS — M542 Cervicalgia: Secondary | ICD-10-CM | POA: Diagnosis not present

## 2021-01-03 DIAGNOSIS — M62830 Muscle spasm of back: Secondary | ICD-10-CM | POA: Insufficient documentation

## 2021-01-03 DIAGNOSIS — G8929 Other chronic pain: Secondary | ICD-10-CM | POA: Diagnosis not present

## 2021-01-03 DIAGNOSIS — M6281 Muscle weakness (generalized): Secondary | ICD-10-CM | POA: Insufficient documentation

## 2021-01-03 NOTE — Therapy (Signed)
[facility] [address], [place], [place], [address] Phone: [phone]   Fax:  [phone]  Physical Therapy Treatment  Patient Details  Name: Janet Nguyen MRN: [id number] Date of Birth: 07-26-1932 Referring Provider (PT): Lawerance Cruel, MD   Encounter Date: 01/03/2021   PT End of Session - 01/03/21 1322    Visit Number 2    Date for PT Re-Evaluation 02/20/21    Authorization Type Medicare  KX when needed    Progress Note Due on Visit 10    PT Start Time 1233    PT Stop Time 1316    PT Time Calculation (min) 43 min    Activity Tolerance Patient tolerated treatment well    Behavior During Therapy [facility] for tasks assessed/performed           Past Medical History:  Diagnosis Date  . Arrhythmia    History of SVT with documented PVC'S and  PAC'S  12/08/12 Nuc stress test normal LV EF 74%  Event Monitor  12/01/12-01/03/13  . Atrial flutter ([place])   . Celiac disease    treated by Dr. [name]  . GERD (gastroesophageal reflux disease)   . Intervertebral disc stenosis of neural canal of cervical region   . Irregular heart beat 11/30/12   ECHO-EF 60-65%  . Osteoporosis   . PMR (polymyalgia rheumatica) (HCC)    Dr. Marijean Bravo; pt states she was diagnosed 10-15 years ago, not treated at this time or any issues that she is aware of.  . Scoliosis   . Scoliosis   . Sleep apnea 10/02/11 [facility]   Sleep study AHI -total sleep 10.3/hr  64.0/ hr during REM sleep.RDI 22.8/hr during total sleep 64.0/hr during REM sleep The lowest O2 sat during Non-REM and REM sleep was 86% and 88% respectively. 04/08/12 CPAP[facility]BIPAP titration study  [facility]    Past Surgical History:  Procedure Laterality Date  . APPENDECTOMY     ruptured at age 20 and had surgery  . CARDIAC CATHETERIZATION  01/27/06  . cataract surgery  2015   Dr. Herbert Deaner; March & April 2015    There were no vitals filed for this  visit.   Subjective Assessment - 01/03/21 1236    Subjective I feel about the same and worse.    Currently in Pain? Yes    Pain Score 10-Worst pain ever    Pain Location Head    Pain Orientation Right;Left    Pain Descriptors / Indicators Aching;Tightness    Pain Type Chronic pain    Pain Radiating Towards back to hip    Pain Onset More than a month ago    Aggravating Factors  everything    Pain Relieving Factors medication, heat                             [facility] Adult PT Treatment/Exercise - 01/03/21 0001      Lumbar Exercises: Stretches   Lower Trunk Rotation 2 reps;20 seconds    Lower Trunk Rotation Limitations extensive verbal cues for technique      Lumbar Exercises: Supine   Ab Set 5 reps      Knee/Hip Exercises: Stretches   Piriformis Stretch 2 reps;20 seconds      Manual Therapy   Manual Therapy Soft tissue mobilization    Soft tissue mobilization bil upper traps, cervical paraspinals, rhomboids, thoracic paraspinals  PT Education - 01/03/21 1253    Education Details Access Code: [email or web address]    Person(s) Educated Patient    Methods Explanation;Demonstration;Handout    Comprehension Verbalized understanding;Returned demonstration            PT Short Term Goals - 12/26/20 1238      PT SHORT TERM GOAL #1   Title Pt will be ind with initial HEP    Baseline -    Time 4    Period Weeks    Status New    Target Date 01/23/21      PT SHORT TERM GOAL #2   Title Pt will be instructed in spinal decompression series for pain relief and bone support/protection.    Time 4    Period Weeks    Status New    Target Date 01/23/21      PT SHORT TERM GOAL #3   Title Pt will report 20% reduction in Lt rib and lumbar/neck during the day.    Baseline --    Time 4    Period Weeks    Status New    Target Date 01/23/21             PT Long Term Goals - 12/26/20 1300      PT LONG TERM GOAL #1   Title be independent in  advanced HEP    Baseline --    Time 8    Period Weeks    Status New    Target Date 02/20/21      PT LONG TERM GOAL #2   Title report use of pain managemt strategies and position changes during daily tasks to improve posture and postural endurance    Time --    Period --    Status New    Target Date 02/20/21      PT LONG TERM GOAL #3   Title Pt will report at least 50% less neck pain with use of HEP, home modalities, postural self-checks and actiivty modification as tools for pain control.    Baseline --      PT LONG TERM GOAL #4   Title --                 Plan - 01/03/21 1321    Clinical Impression Statement Pt has been trying to do more exercises at home and reports compliance with standing band exercises and supine flexibility.  PT provided max verbal and tactile cueing for low trunk rotation as pt wanted to stretch her legs to the floor.  PT educated to only stretch to tolerance.  Pt will begin abdominal bracing at home to address abdominal bulge.  Pt with tension and trigger points in Rt>Lt cervical and thoracic musculature.  Pt will continue to benefit from skilled PT to address chronic pain and postural abnormality.    PT Frequency 2x / week    PT Duration 8 weeks    PT Treatment/Interventions ADLs/Self Care Home Management;Joint Manipulations;Spinal Manipulations;Manual techniques;Neuromuscular re-education;Therapeutic exercise;Therapeutic activities;Functional mobility training;Moist Heat;Electrical Stimulation;Passive range of motion;Traction;Dry needling;Patient/family education;Cryotherapy    PT Next Visit Plan work on postural strength to pt tolerance, manual as indicated    PT Home Exercise Plan Access Code: [email or web address] standing theraband    Consulted and Agree with Plan of Care Patient           Patient will benefit from skilled therapeutic intervention in order to improve the following deficits and impairments:  Postural dysfunction,Decreased strength,Decreased  mobility,Impaired flexibility,Hypomobility,Pain,Increased muscle spasms,Increased fascial  restricitons,Decreased range of motion,Decreased activity tolerance  Visit Diagnosis: Muscle weakness (generalized)  Other idiopathic scoliosis, thoracolumbar region  Abnormal posture  Muscle spasm of back  Chronic bilateral low back pain without sciatica  Cervicalgia     Problem List Patient Active Problem List   Diagnosis Date Noted  . Deviated septum 05/08/2020  . Mass of subcutaneous tissue 05/02/2020  . Vertigo 06/16/2019  . Pulmonary hypertension, unspecified ([place]) 04/14/2019  . Ischemic colitis ([place]) 04/14/2019  . Migraine with aura and without status migrainosus, not intractable 11/08/2018  . Degeneration of lumbar intervertebral disc 10/14/2018  . Hoarseness of voice 03/04/2018  . Abdominal pain 07/01/2017  . Diverticulitis, colon   . Metabolic acidosis, increased anion gap   . Constipation 04/14/2017  . Lumbar hernia 04/14/2017  . Presbycusis of both ears 01/10/2017  . Tinnitus aurium, bilateral 01/10/2017  . Gastroesophageal reflux disease 08/20/2016  . Hemoptysis 08/20/2016  . Obstructive sleep apnea of adult 08/20/2016  . Rhinitis, chronic 08/20/2016  . Throat pain in adult 08/20/2016  . Fatigue 12/23/2015  . Sciatica of right side 10/06/2015  . History of migraine headaches 10/06/2015  . Frequent PVCs 12/28/2013  . Premature atrial contractions 12/28/2013  . PSVT (paroxysmal supraventricular tachycardia) ([place]) 12/28/2013  . Heart palpitations 07/13/2013  . Sleep apnea 04/11/2013  . Scoliosis 04/11/2013  . Atrial flutter with rapid ventricular response ([place]) 11/29/2012  . Chest pain, atypical 11/29/2012  . Fibromyalgia syndrome 11/29/2012  . Chronic steroid use 11/29/2012     Sigurd Sos, PT 01/03/21 1:23 PM  [facility] [address], [place], [place], [address] Phone: [phone]   Fax:   [phone]  Name: Janet Nguyen MRN: [id number] Date of Birth: 07/28/1932

## 2021-01-03 NOTE — Patient Instructions (Signed)
Access Code: [email or web address] URL: [email or web address] Date: 01/03/2021 Prepared by: [name]  Exercises  Seated Figure 4 Piriformis Stretch - 2 x daily - 7 x weekly - 1 sets - 3 reps - 20 hold Hooklying Transversus Abdominis Palpation - 1 x daily - 7 x weekly - 3 sets - 10 reps Seated Transversus Abdominis Bracing - 1 x daily - 7 x weekly - 3 sets - 10 reps

## 2021-01-15 ENCOUNTER — Encounter: Payer: Self-pay | Admitting: Physical Therapy

## 2021-01-15 ENCOUNTER — Other Ambulatory Visit: Payer: Self-pay

## 2021-01-15 ENCOUNTER — Ambulatory Visit: Payer: Medicare Other | Admitting: Physical Therapy

## 2021-01-15 DIAGNOSIS — M4125 Other idiopathic scoliosis, thoracolumbar region: Secondary | ICD-10-CM | POA: Diagnosis not present

## 2021-01-15 DIAGNOSIS — R293 Abnormal posture: Secondary | ICD-10-CM

## 2021-01-15 DIAGNOSIS — M545 Low back pain, unspecified: Secondary | ICD-10-CM | POA: Diagnosis not present

## 2021-01-15 DIAGNOSIS — M6281 Muscle weakness (generalized): Secondary | ICD-10-CM | POA: Diagnosis not present

## 2021-01-15 DIAGNOSIS — M542 Cervicalgia: Secondary | ICD-10-CM

## 2021-01-15 DIAGNOSIS — G8929 Other chronic pain: Secondary | ICD-10-CM | POA: Diagnosis not present

## 2021-01-15 DIAGNOSIS — M62830 Muscle spasm of back: Secondary | ICD-10-CM

## 2021-01-15 NOTE — Therapy (Signed)
[facility] [address], [place], [place], [address] Phone: [phone]   Fax:  [phone]  Physical Therapy Treatment  Patient Details  Name: Janet Nguyen MRN: [id number] Date of Birth: 1932-01-14 Referring Provider (PT): Lawerance Cruel, MD   Encounter Date: 01/15/2021   PT End of Session - 01/15/21 1234    Visit Number 3    Date for PT Re-Evaluation 02/20/21    Authorization Type Medicare  KX when needed    Progress Note Due on Visit 10    PT Start Time 1234    PT Stop Time 1308    PT Time Calculation (min) 34 min    Activity Tolerance Patient tolerated treatment well    Behavior During Therapy [facility] for tasks assessed/performed           Past Medical History:  Diagnosis Date  . Arrhythmia    History of SVT with documented PVC'S and  PAC'S  12/08/12 Nuc stress test normal LV EF 74%  Event Monitor  12/01/12-01/03/13  . Atrial flutter ([place])   . Celiac disease    treated by Dr. [name]  . GERD (gastroesophageal reflux disease)   . Intervertebral disc stenosis of neural canal of cervical region   . Irregular heart beat 11/30/12   ECHO-EF 60-65%  . Osteoporosis   . PMR (polymyalgia rheumatica) (HCC)    Dr. Marijean Bravo; pt states she was diagnosed 10-15 years ago, not treated at this time or any issues that she is aware of.  . Scoliosis   . Scoliosis   . Sleep apnea 10/02/11 [facility]   Sleep study AHI -total sleep 10.3/hr  64.0/ hr during REM sleep.RDI 22.8/hr during total sleep 64.0/hr during REM sleep The lowest O2 sat during Non-REM and REM sleep was 86% and 88% respectively. 04/08/12 CPAP/BIPAP titration study [facility]    Past Surgical History:  Procedure Laterality Date  . APPENDECTOMY     ruptured at age 55 and had surgery  . CARDIAC CATHETERIZATION  01/27/06  . cataract surgery  2015   Dr. Herbert Deaner; March & April 2015    There were no vitals filed for this  visit.   Subjective Assessment - 01/15/21 1236    Subjective Pain is a 5/10 today.  I have been more diligent in my exercises and I wonder if it has contributed to Lt ribcage pain I'm having today.    Pertinent History scoliosis, sleep apnea (no CPAP)    Limitations House hold activities;Lifting    Patient Stated Goals pain relief    Currently in Pain? Yes    Pain Score 5     Pain Location Rib cage    Pain Orientation Left    Pain Descriptors / Indicators Aching;Tightness    Pain Type Chronic pain    Pain Radiating Towards upper back    Pain Onset More than a month ago    Pain Frequency Intermittent    Aggravating Factors  everything    Pain Relieving Factors meds, heat                             [facility] Adult PT Treatment/Exercise - 01/15/21 0001      Self-Care   Self-Care Other Self-Care Comments      Exercises   Exercises Other Exercises;Shoulder    Other Exercises  lateral costal expansion with TC by PT x 10 rounds,  VC to inhale more slowly over 5-7 sec      Shoulder Exercises: Standing   External Rotation Strengthening;Both;10 reps;Theraband    Theraband Level (Shoulder External Rotation) Level 1 ([place]ellow)    Extension Strengthening;Both;10 reps;Theraband    Theraband Level (Shoulder Extension) Level 1 ([place]ellow)    Extension Limitations TC to correct sway back posture    Row Strengthening;Both;10 reps;Theraband    Theraband Level (Shoulder Row) Level 1 ([place]ellow)    Row Limitations TC to correct sway back posture      Manual Therapy   Manual Therapy Soft tissue mobilization    Soft tissue mobilization bil upper traps, Lt intercostals, Lt diaphragm release, pectorals, rhomboids   seated per Pt's position request                   PT Short Term Goals - 01/15/21 1315      PT SHORT TERM GOAL #1   Title Pt will be ind with initial HEP    Status On-going             PT Long Term Goals - 12/26/20 1300      PT LONG TERM GOAL #1   Title  be independent in advanced HEP    Baseline --    Time 8    Period Weeks    Status New    Target Date 02/20/21      PT LONG TERM GOAL #2   Title report use of pain managemt strategies and position changes during daily tasks to improve posture and postural endurance    Time --    Period --    Status New    Target Date 02/20/21      PT LONG TERM GOAL #3   Title Pt will report at least 50% less neck pain with use of HEP, home modalities, postural self-checks and actiivty modification as tools for pain control.    Baseline --      PT LONG TERM GOAL #4   Title --                 Plan - 01/15/21 1312    Clinical Impression Statement Pt reported she has increased her compliance with yellow tband HEP and the exercises hurt her neck.  She has developed some intermittent sharp pains around Lt ricage anteriorly which she is unsure whether is caused by ther ex.  She has adaptively shortened Lt trunk secondary to signif scoliosis.  PT performed STM to diaphragm and intercostals and encouraged lateral costal expansion breathwork to improve mobility along thoracic region.  PT observed Pt perform 1x10 each of row, shoulder ext and shoulder ER today and cued Pt to avoid sway back posture to better align trunk and engage deep core muscles of support.  Pt will continue to benefit from skilled PT along POC.    Comorbidities fibromyalgia, scoliosis, chronic neck pain    Rehab Potential Good    PT Frequency 2x / week    PT Duration 8 weeks    PT Treatment/Interventions ADLs/Self Care Home Management;Joint Manipulations;Spinal Manipulations;Manual techniques;Neuromuscular re-education;Therapeutic exercise;Therapeutic activities;Functional mobility training;Moist Heat;Electrical Stimulation;Passive range of motion;Traction;Dry needling;Patient/family education;Cryotherapy    PT Next Visit Plan work on postural strength to pt tolerance, manual as indicated    PT Home Exercise Plan Access Code: [email or web address]  [email or web address]    Consulted and Agree with Plan of Care Patient           Patient will benefit from skilled therapeutic intervention  in order to improve the following deficits and impairments:     Visit Diagnosis: Muscle weakness (generalized)  Other idiopathic scoliosis, thoracolumbar region  Abnormal posture  Muscle spasm of back  Cervicalgia     Problem List Patient Active Problem List   Diagnosis Date Noted  . Deviated septum 05/08/2020  . Mass of subcutaneous tissue 05/02/2020  . Vertigo 06/16/2019  . Pulmonary hypertension, unspecified ([place]) 04/14/2019  . Ischemic colitis ([place]) 04/14/2019  . Migraine with aura and without status migrainosus, not intractable 11/08/2018  . Degeneration of lumbar intervertebral disc 10/14/2018  . Hoarseness of voice 03/04/2018  . Abdominal pain 07/01/2017  . Diverticulitis, colon   . Metabolic acidosis, increased anion gap   . Constipation 04/14/2017  . Lumbar hernia 04/14/2017  . Presbycusis of both ears 01/10/2017  . Tinnitus aurium, bilateral 01/10/2017  . Gastroesophageal reflux disease 08/20/2016  . Hemoptysis 08/20/2016  . Obstructive sleep apnea of adult 08/20/2016  . Rhinitis, chronic 08/20/2016  . Throat pain in adult 08/20/2016  . Fatigue 12/23/2015  . Sciatica of right side 10/06/2015  . History of migraine headaches 10/06/2015  . Frequent PVCs 12/28/2013  . Premature atrial contractions 12/28/2013  . PSVT (paroxysmal supraventricular tachycardia) ([place]) 12/28/2013  . Heart palpitations 07/13/2013  . Sleep apnea 04/11/2013  . Scoliosis 04/11/2013  . Atrial flutter with rapid ventricular response ([place]) 11/29/2012  . Chest pain, atypical 11/29/2012  . Fibromyalgia syndrome 11/29/2012  . Chronic steroid use 11/29/2012    Baruch Merl, PT 01/15/21 1:16 PM   [facility] [address] [place], [place], [address] Phone: [phone]   Fax:   [phone]  Name: Janet Nguyen MRN: [id number] Date of Birth: [birth year]

## 2021-01-21 ENCOUNTER — Other Ambulatory Visit: Payer: Self-pay

## 2021-01-21 ENCOUNTER — Ambulatory Visit: Payer: Medicare Other

## 2021-01-21 DIAGNOSIS — G8929 Other chronic pain: Secondary | ICD-10-CM | POA: Diagnosis not present

## 2021-01-21 DIAGNOSIS — M4125 Other idiopathic scoliosis, thoracolumbar region: Secondary | ICD-10-CM

## 2021-01-21 DIAGNOSIS — M62830 Muscle spasm of back: Secondary | ICD-10-CM

## 2021-01-21 DIAGNOSIS — M6281 Muscle weakness (generalized): Secondary | ICD-10-CM | POA: Diagnosis not present

## 2021-01-21 DIAGNOSIS — M545 Low back pain, unspecified: Secondary | ICD-10-CM | POA: Diagnosis not present

## 2021-01-21 DIAGNOSIS — R293 Abnormal posture: Secondary | ICD-10-CM | POA: Diagnosis not present

## 2021-01-21 NOTE — Therapy (Signed)
[facility] [address] [place], [place], [address] Phone: [phone]   Fax:  [phone]  Physical Therapy Treatment  Patient Details  Name: Janet Nguyen MRN: [id number] Date of Birth: [birth year] Referring Provider (PT): Lawerance Cruel, MD   Encounter Date: 01/21/2021   PT End of Session - 01/21/21 1537    Visit Number 4    Date for PT Re-Evaluation 02/20/21    Authorization Type Medicare  KX when needed    Progress Note Due on Visit 10    PT Start Time 1450    PT Stop Time 1530    PT Time Calculation (min) 40 min    Activity Tolerance Patient tolerated treatment well    Behavior During Therapy [facility] for tasks assessed/performed           Past Medical History:  Diagnosis Date  . Arrhythmia    History of SVT with documented PVC'S and  PAC'S  12/08/12 Nuc stress test normal LV EF 74%  Event Monitor  12/01/12-01/03/13  . Atrial flutter ([place])   . Celiac disease    treated by Dr. [name]  . GERD (gastroesophageal reflux disease)   . Intervertebral disc stenosis of neural canal of cervical region   . Irregular heart beat 11/30/12   ECHO-EF 60-65%  . Osteoporosis   . PMR (polymyalgia rheumatica) (HCC)    Dr. Marijean Bravo; pt states she was diagnosed 10-15 years ago, not treated at this time or any issues that she is aware of.  . Scoliosis   . Scoliosis   . Sleep apnea 10/02/11 [facility]   Sleep study AHI -total sleep 10.3/hr  64.0/ hr during REM sleep.RDI 22.8/hr during total sleep 64.0/hr during REM sleep The lowest O2 sat during Non-REM and REM sleep was 86% and 88% respectively. 04/08/12 CPAP/BIPAP titration study  [facility]    Past Surgical History:  Procedure Laterality Date  . APPENDECTOMY     ruptured at age 8 and had surgery  . CARDIAC CATHETERIZATION  01/27/06  . cataract surgery  2015   Dr. Herbert Deaner; March & April 2015    There were no vitals filed for this  visit.   Subjective Assessment - 01/21/21 1449    Subjective I am hot becasue it is so warm outside.  I had a hard week but I have been feeling better today until I was out in the heat.    Currently in Pain? Yes    Pain Score 4     Pain Location Rib cage    Pain Orientation Left    Pain Descriptors / Indicators Aching;Tightness    Pain Onset More than a month ago    Pain Frequency Intermittent                             [facility] Adult PT Treatment/Exercise - 01/21/21 0001      Exercises   Other Exercises  lateral costal expansion with TC by PT x 10 rounds, VC to inhale more slowly over 5-7 sec      Neck Exercises: Standing   Other Standing Exercises doorway pec stretch: discussion regarding hold time to increase elongation of Lt ribcage      Knee/Hip Exercises: Stretches   Piriformis Stretch 2 reps;20 seconds      Manual Therapy   Manual Therapy Soft tissue mobilization    Soft tissue mobilization bil upper traps,  Lt intercostals, rhomboids   seated per Pt's position request                 PT Education - 01/21/21 1459    Education Details verbal review of all HEP- discussion regarding purpose of each and how to make modifications at home.  Advised pt to hold doorway stretches longer and elevate Lt UE higher on the door to stretch ribs    Person(s) Educated Patient    Methods Explanation;Handout    Comprehension Verbalized understanding            PT Short Term Goals - 01/15/21 1315      PT SHORT TERM GOAL #1   Title Pt will be ind with initial HEP    Status On-going             PT Long Term Goals - 12/26/20 1300      PT LONG TERM GOAL #1   Title be independent in advanced HEP    Baseline --    Time 8    Period Weeks    Status New    Target Date 02/20/21      PT LONG TERM GOAL #2   Title report use of pain managemt strategies and position changes during daily tasks to improve posture and postural endurance    Time --    Period  --    Status New    Target Date 02/20/21      PT LONG TERM GOAL #3   Title Pt will report at least 50% less neck pain with use of HEP, home modalities, postural self-checks and actiivty modification as tools for pain control.    Baseline --      PT LONG TERM GOAL #4   Title --                 Plan - 01/21/21 1537    Clinical Impression Statement PT spent early part of session reviewing all HEP and working through modifications with pt.  Pt has been walking and addressing posture as she is doing this.  She has been compliant with diaphragmatic breathing and rib cage expansion in addition to self-massage to Lt rib/abdominal region.  Pt is working to find a position on her bed to stretch.  Pt with tension and trigger points along bil upper traps and Lt rhomboids with manual therapy today.  Pt with significant scoliosis and postural abnormality  and will continue to benefit from skilled PT to address posture, alignment and tissue mobility to improve movement and reduce pain.    Rehab Potential Good    PT Frequency 2x / week    PT Duration 8 weeks    PT Treatment/Interventions ADLs/Self Care Home Management;Joint Manipulations;Spinal Manipulations;Manual techniques;Neuromuscular re-education;Therapeutic exercise;Therapeutic activities;Functional mobility training;Moist Heat;Electrical Stimulation;Passive range of motion;Traction;Dry needling;Patient/family education;Cryotherapy    PT Next Visit Plan work on postural strength to pt tolerance, manual as indicated    PT Home Exercise Plan Access Code: [email or web address] standing [email or web address]    Consulted and Agree with Plan of Care Patient           Patient will benefit from skilled therapeutic intervention in order to improve the following deficits and impairments:  Postural dysfunction,Decreased strength,Decreased mobility,Impaired flexibility,Hypomobility,Pain,Increased muscle spasms,Increased fascial restricitons,Decreased range of motion,Decreased  activity tolerance  Visit Diagnosis: Other idiopathic scoliosis, thoracolumbar region  Muscle weakness (generalized)  Abnormal posture  Muscle spasm of back     Problem List Patient Active Problem List   Diagnosis  Date Noted  . Deviated septum 05/08/2020  . Mass of subcutaneous tissue 05/02/2020  . Vertigo 06/16/2019  . Pulmonary hypertension, unspecified ([place]) 04/14/2019  . Ischemic colitis ([place]) 04/14/2019  . Migraine with aura and without status migrainosus, not intractable 11/08/2018  . Degeneration of lumbar intervertebral disc 10/14/2018  . Hoarseness of voice 03/04/2018  . Abdominal pain 07/01/2017  . Diverticulitis, colon   . Metabolic acidosis, increased anion gap   . Constipation 04/14/2017  . Lumbar hernia 04/14/2017  . Presbycusis of both ears 01/10/2017  . Tinnitus aurium, bilateral 01/10/2017  . Gastroesophageal reflux disease 08/20/2016  . Hemoptysis 08/20/2016  . Obstructive sleep apnea of adult 08/20/2016  . Rhinitis, chronic 08/20/2016  . Throat pain in adult 08/20/2016  . Fatigue 12/23/2015  . Sciatica of right side 10/06/2015  . History of migraine headaches 10/06/2015  . Frequent PVCs 12/28/2013  . Premature atrial contractions 12/28/2013  . PSVT (paroxysmal supraventricular tachycardia) ([place]) 12/28/2013  . Heart palpitations 07/13/2013  . Sleep apnea 04/11/2013  . Scoliosis 04/11/2013  . Atrial flutter with rapid ventricular response ([place]) 11/29/2012  . Chest pain, atypical 11/29/2012  . Fibromyalgia syndrome 11/29/2012  . Chronic steroid use 11/29/2012     Sigurd Sos, PT 01/21/21 3:39 PM  [facility] [address] [place], [place], [address] Phone: [phone]   Fax:  [phone]  Name: Janet Nguyen MRN: [id number] Date of Birth: 1931/12/28

## 2021-01-23 DIAGNOSIS — K589 Irritable bowel syndrome without diarrhea: Secondary | ICD-10-CM | POA: Diagnosis not present

## 2021-01-23 DIAGNOSIS — K1379 Other lesions of oral mucosa: Secondary | ICD-10-CM | POA: Diagnosis not present

## 2021-01-23 DIAGNOSIS — R233 Spontaneous ecchymoses: Secondary | ICD-10-CM | POA: Diagnosis not present

## 2021-01-23 DIAGNOSIS — M810 Age-related osteoporosis without current pathological fracture: Secondary | ICD-10-CM | POA: Diagnosis not present

## 2021-01-28 ENCOUNTER — Other Ambulatory Visit: Payer: Self-pay

## 2021-01-28 ENCOUNTER — Ambulatory Visit: Payer: Medicare Other | Attending: Family Medicine | Admitting: Physical Therapy

## 2021-01-28 ENCOUNTER — Encounter: Payer: Self-pay | Admitting: Physical Therapy

## 2021-01-28 DIAGNOSIS — G8929 Other chronic pain: Secondary | ICD-10-CM | POA: Insufficient documentation

## 2021-01-28 DIAGNOSIS — M542 Cervicalgia: Secondary | ICD-10-CM | POA: Diagnosis not present

## 2021-01-28 DIAGNOSIS — M545 Low back pain, unspecified: Secondary | ICD-10-CM | POA: Diagnosis not present

## 2021-01-28 DIAGNOSIS — R293 Abnormal posture: Secondary | ICD-10-CM | POA: Diagnosis not present

## 2021-01-28 DIAGNOSIS — M62830 Muscle spasm of back: Secondary | ICD-10-CM | POA: Insufficient documentation

## 2021-01-28 DIAGNOSIS — M6281 Muscle weakness (generalized): Secondary | ICD-10-CM | POA: Diagnosis not present

## 2021-01-28 DIAGNOSIS — M4125 Other idiopathic scoliosis, thoracolumbar region: Secondary | ICD-10-CM | POA: Insufficient documentation

## 2021-01-28 NOTE — Therapy (Signed)
[facility] [address] [place], [place], [address] Phone: [phone]   Fax:  [phone]  Physical Therapy Treatment  Patient Details  Name: Janet Nguyen MRN: [id number] Date of Birth: Jul 24, 1932 Referring Provider (PT): Lawerance Cruel, MD   Encounter Date: 01/28/2021   PT End of Session - 01/28/21 1056    Visit Number 5    Date for PT Re-Evaluation 02/20/21    Authorization Type Medicare  KX when needed    Progress Note Due on Visit 10    PT Start Time 1015    PT Stop Time 1055    PT Time Calculation (min) 40 min    Activity Tolerance Patient tolerated treatment well    Behavior During Therapy [facility] for tasks assessed/performed           Past Medical History:  Diagnosis Date  . Arrhythmia    History of SVT with documented PVC'S and  PAC'S  12/08/12 Nuc stress test normal LV EF 74%  Event Monitor  12/01/12-01/03/13  . Atrial flutter ([place])   . Celiac disease    treated by Dr. [name]  . GERD (gastroesophageal reflux disease)   . Intervertebral disc stenosis of neural canal of cervical region   . Irregular heart beat 11/30/12   ECHO-EF 60-65%  . Osteoporosis   . PMR (polymyalgia rheumatica) (HCC)    Dr. Marijean Bravo; pt states she was diagnosed 10-15 years ago, not treated at this time or any issues that she is aware of.  . Scoliosis   . Scoliosis   . Sleep apnea 10/02/11 [facility]   Sleep study AHI -total sleep 10.3/hr  64.0/ hr during REM sleep.RDI 22.8/hr during total sleep 64.0/hr during REM sleep The lowest O2 sat during Non-REM and REM sleep was 86% and 88% respectively. 04/08/12 CPAP/BIPAP titration study [facility]    Past Surgical History:  Procedure Laterality Date  . APPENDECTOMY     ruptured at age 60 and had surgery  . CARDIAC CATHETERIZATION  01/27/06  . cataract surgery  2015   Dr. Herbert Deaner; March & April 2015    There were no vitals filed for this  visit.   Subjective Assessment - 01/28/21 1018    Subjective I have made the adjustments [name] suggested last time for my exercises.  PT has helped me get back into the swing of things again.    Pertinent History scoliosis, sleep apnea (no CPAP)    Limitations House hold activities;Lifting    Patient Stated Goals pain relief    Currently in Pain? Yes    Pain Score 4     Pain Location Rib cage    Pain Orientation Left    Pain Descriptors / Indicators Aching;Tightness    Pain Type Chronic pain    Pain Radiating Towards upper back    Pain Onset More than a month ago    Pain Frequency Intermittent    Aggravating Factors  everything    Pain Relieving Factors meds, heat                             [facility] Adult PT Treatment/Exercise - 01/28/21 0001      Manual Therapy   Manual Therapy Soft tissue mobilization    Manual therapy comments Lt diaphragm release under ribcage with lateral costal expansion VC x 8 rounds    Soft tissue mobilization bil upper traps,  rhobmoids, Lt pectorals, lats, posterior GH joint, Lt intercostals, QL and lumbar/thoracic paraspinals in Rt SL                    PT Short Term Goals - 01/15/21 1315      PT SHORT TERM GOAL #1   Title Pt will be ind with initial HEP    Status On-going             PT Long Term Goals - 12/26/20 1300      PT LONG TERM GOAL #1   Title be independent in advanced HEP    Baseline --    Time 8    Period Weeks    Status New    Target Date 02/20/21      PT LONG TERM GOAL #2   Title report use of pain managemt strategies and position changes during daily tasks to improve posture and postural endurance    Time --    Period --    Status New    Target Date 02/20/21      PT LONG TERM GOAL #3   Title Pt will report at least 50% less neck pain with use of HEP, home modalities, postural self-checks and actiivty modification as tools for pain control.    Baseline --      PT LONG TERM GOAL #4   Title  --                 Plan - 01/28/21 1057    Clinical Impression Statement Pt has been compliant with HEP with adjustments from last visit as recommended by PT.  Pt rated pain 4/10 today on arrival with bil neck/upper back and Lt ribcage pain.  PT encouraged Pt to spend part of session in Rt SL using care to prop and support spine for comfort to address Lt sided compressed spine and soft tissues.  Elongation techniques and ribcage expansion through diaphragm release and breathing techniques were used with good response.  Pt with long history of pain and severe scoliosis that benefits from manual techniques for improved QOL and pain management.  Continue along POC.    Comorbidities fibromyalgia, scoliosis, chronic neck pain    PT Frequency 2x / week    PT Duration 8 weeks    PT Treatment/Interventions ADLs/Self Care Home Management;Joint Manipulations;Spinal Manipulations;Manual techniques;Neuromuscular re-education;Therapeutic exercise;Therapeutic activities;Functional mobility training;Moist Heat;Electrical Stimulation;Passive range of motion;Traction;Dry needling;Patient/family education;Cryotherapy    PT Next Visit Plan work on postural strength to pt tolerance, manual as indicated    PT Home Exercise Plan Access Code: [email or web address]- standing theraband, breathing exercises for lateral costal expansion    Consulted and Agree with Plan of Care Patient           Patient will benefit from skilled therapeutic intervention in order to improve the following deficits and impairments:     Visit Diagnosis: Other idiopathic scoliosis, thoracolumbar region  Muscle weakness (generalized)  Abnormal posture  Muscle spasm of back  Cervicalgia  Chronic bilateral low back pain without sciatica     Problem List Patient Active Problem List   Diagnosis Date Noted  . Deviated septum 05/08/2020  . Mass of subcutaneous tissue 05/02/2020  . Vertigo 06/16/2019  . Pulmonary hypertension,  unspecified ([place]) 04/14/2019  . Ischemic colitis ([place]) 04/14/2019  . Migraine with aura and without status migrainosus, not intractable 11/08/2018  . Degeneration of lumbar intervertebral disc 10/14/2018  . Hoarseness of voice 03/04/2018  . Abdominal pain 07/01/2017  .  Diverticulitis, colon   . Metabolic acidosis, increased anion gap   . Constipation 04/14/2017  . Lumbar hernia 04/14/2017  . Presbycusis of both ears 01/10/2017  . Tinnitus aurium, bilateral 01/10/2017  . Gastroesophageal reflux disease 08/20/2016  . Hemoptysis 08/20/2016  . Obstructive sleep apnea of adult 08/20/2016  . Rhinitis, chronic 08/20/2016  . Throat pain in adult 08/20/2016  . Fatigue 12/23/2015  . Sciatica of right side 10/06/2015  . History of migraine headaches 10/06/2015  . Frequent PVCs 12/28/2013  . Premature atrial contractions 12/28/2013  . PSVT (paroxysmal supraventricular tachycardia) ([place]) 12/28/2013  . Heart palpitations 07/13/2013  . Sleep apnea 04/11/2013  . Scoliosis 04/11/2013  . Atrial flutter with rapid ventricular response ([place]) 11/29/2012  . Chest pain, atypical 11/29/2012  . Fibromyalgia syndrome 11/29/2012  . Chronic steroid use 11/29/2012    Baruch Merl, PT 01/28/21 11:00 AM   [facility] [address], [place], [place], [address] Phone: [phone]   Fax:  [phone]  Name: Janet Nguyen MRN: [id number] Date of Birth: 1932/02/22

## 2021-01-30 ENCOUNTER — Other Ambulatory Visit: Payer: Self-pay

## 2021-01-30 ENCOUNTER — Encounter: Payer: Self-pay | Admitting: Physical Therapy

## 2021-01-30 ENCOUNTER — Ambulatory Visit: Payer: Medicare Other | Admitting: Physical Therapy

## 2021-01-30 DIAGNOSIS — M62830 Muscle spasm of back: Secondary | ICD-10-CM

## 2021-01-30 DIAGNOSIS — R293 Abnormal posture: Secondary | ICD-10-CM

## 2021-01-30 DIAGNOSIS — M6281 Muscle weakness (generalized): Secondary | ICD-10-CM | POA: Diagnosis not present

## 2021-01-30 DIAGNOSIS — M542 Cervicalgia: Secondary | ICD-10-CM | POA: Diagnosis not present

## 2021-01-30 DIAGNOSIS — M4125 Other idiopathic scoliosis, thoracolumbar region: Secondary | ICD-10-CM | POA: Diagnosis not present

## 2021-01-30 DIAGNOSIS — M545 Low back pain, unspecified: Secondary | ICD-10-CM | POA: Diagnosis not present

## 2021-01-30 NOTE — Therapy (Signed)
[facility] [address] [place], [place], [address] Phone: [phone]   Fax:  [phone]  Physical Therapy Treatment  Patient Details  Name: Janet Nguyen MRN: [id number] Date of Birth: 1932-07-27 Referring Provider (PT): Lawerance Cruel, MD   Encounter Date: 01/30/2021   PT End of Session - 01/30/21 1022    Visit Number 6    Date for PT Re-Evaluation 02/20/21    Authorization Type Medicare  KX when needed    Progress Note Due on Visit 10    PT Start Time 1017    PT Stop Time 1058    PT Time Calculation (min) 41 min    Activity Tolerance Patient tolerated treatment well    Behavior During Therapy [facility] for tasks assessed/performed           Past Medical History:  Diagnosis Date  . Arrhythmia    History of SVT with documented PVC'S and  PAC'S  12/08/12 Nuc stress test normal LV EF 74%  Event Monitor  12/01/12-01/03/13  . Atrial flutter ([place])   . Celiac disease    treated by Dr. [name]  . GERD (gastroesophageal reflux disease)   . Intervertebral disc stenosis of neural canal of cervical region   . Irregular heart beat 11/30/12   ECHO-EF 60-65%  . Osteoporosis   . PMR (polymyalgia rheumatica) (HCC)    Dr. Marijean Bravo; pt states she was diagnosed 10-15 years ago, not treated at this time or any issues that she is aware of.  . Scoliosis   . Scoliosis   . Sleep apnea 10/02/11 [facility]   Sleep study AHI -total sleep 10.3/hr  64.0/ hr during REM sleep.RDI 22.8/hr during total sleep 64.0/hr during REM sleep The lowest O2 sat during Non-REM and REM sleep was 86% and 88% respectively. 04/08/12 CPAP[facility]BIPAP titration study  [facility]    Past Surgical History:  Procedure Laterality Date  . APPENDECTOMY     ruptured at age 66 and had surgery  . CARDIAC CATHETERIZATION  01/27/06  . cataract surgery  2015   Dr. Herbert Deaner; March & April 2015    There were no vitals filed for this  visit.   Subjective Assessment - 01/30/21 1020    Subjective After you worked on my ribcage and abdomen the other day I had a very successful day in the bathroom emptying my bowels.  Maybe more than I wanted.    Pertinent History scoliosis, sleep apnea (no CPAP)    Limitations House hold activities;Lifting    Patient Stated Goals pain relief    Currently in Pain? Yes    Pain Score 6     Pain Location Neck    Pain Orientation Right;Left    Pain Descriptors / Indicators Aching;Tightness    Pain Type Chronic pain    Pain Radiating Towards mid and lower back    Pain Onset More than a month ago    Pain Frequency Intermittent    Aggravating Factors  everything    Pain Relieving Factors meds, heat, massage                             [facility] Adult PT Treatment/Exercise - 01/30/21 0001      Exercises   Exercises Knee/Hip;Lumbar      Lumbar Exercises: Stretches   Active Hamstring Stretch Left;Right;20 seconds;1 rep    Active Hamstring Stretch Limitations supine with 20  ankle pumps    Single Knee to Chest Stretch 3 reps;10 seconds;Left;Right    Single Knee to Chest Stretch Limitations with ankle circles    Piriformis Stretch 1 rep;20 seconds;Left;Right    Other Lumbar Stretch Exercise leg lengthener with ipsilateral overhead reach Lt/Rt 5x5 sec      Lumbar Exercises: Sidelying   Other Sidelying Lumbar Exercises in Rt SL: Lt lateral costal expansion with TC x 3 rounds      Manual Therapy   Manual Therapy Soft tissue mobilization;Manual Traction    Soft tissue mobilization bil upper traps, rhobmoids, Lt pectorals, lats, posterior GH joint, Lt intercostals, QL and glut med bil    Manual Traction Lt hemipelvis distraction in Rt SL                    PT Short Term Goals - 01/15/21 1315      PT SHORT TERM GOAL #1   Title Pt will be ind with initial HEP    Status On-going             PT Long Term Goals - 12/26/20 1300      PT LONG TERM GOAL #1    Title be independent in advanced HEP    Baseline --    Time 8    Period Weeks    Status New    Target Date 02/20/21      PT LONG TERM GOAL #2   Title report use of pain managemt strategies and position changes during daily tasks to improve posture and postural endurance    Time --    Period --    Status New    Target Date 02/20/21      PT LONG TERM GOAL #3   Title Pt will report at least 50% less neck pain with use of HEP, home modalities, postural self-checks and actiivty modification as tools for pain control.    Baseline --      PT LONG TERM GOAL #4   Title --                 Plan - 01/30/21 1041    Clinical Impression Statement Pt reported signif release of bowels following last session's work on abdominal wall and ribcage.  PT revisited LE stretches from past PT HEP as Pt had gotten away from them and was reporting Rt hip pain and low back pain today.  PT avoided abdominal/ribcage work today secondary to Pt request (bowel release yesterday) and instead focused on STM to upper quadrants, hips and lumbar region.  Pt encouraged to continue stretches from today daily.  Continue along POC.    Comorbidities fibromyalgia, scoliosis, chronic neck pain    PT Frequency 2x / week    PT Duration 8 weeks    PT Next Visit Plan work on postural strength to pt tolerance, manual as indicated    PT Home Exercise Plan Access Code: [email or web address]- standing theraband, breathing exercises for lateral costal expansion    Consulted and Agree with Plan of Care Patient           Patient will benefit from skilled therapeutic intervention in order to improve the following deficits and impairments:     Visit Diagnosis: Other idiopathic scoliosis, thoracolumbar region  Muscle weakness (generalized)  Abnormal posture  Muscle spasm of back  Cervicalgia     Problem List Patient Active Problem List   Diagnosis Date Noted  . Deviated septum 05/08/2020  . Mass of subcutaneous  tissue  05/02/2020  . Vertigo 06/16/2019  . Pulmonary hypertension, unspecified ([place]) 04/14/2019  . Ischemic colitis ([place]) 04/14/2019  . Migraine with aura and without status migrainosus, not intractable 11/08/2018  . Degeneration of lumbar intervertebral disc 10/14/2018  . Hoarseness of voice 03/04/2018  . Abdominal pain 07/01/2017  . Diverticulitis, colon   . Metabolic acidosis, increased anion gap   . Constipation 04/14/2017  . Lumbar hernia 04/14/2017  . Presbycusis of both ears 01/10/2017  . Tinnitus aurium, bilateral 01/10/2017  . Gastroesophageal reflux disease 08/20/2016  . Hemoptysis 08/20/2016  . Obstructive sleep apnea of adult 08/20/2016  . Rhinitis, chronic 08/20/2016  . Throat pain in adult 08/20/2016  . Fatigue 12/23/2015  . Sciatica of right side 10/06/2015  . History of migraine headaches 10/06/2015  . Frequent PVCs 12/28/2013  . Premature atrial contractions 12/28/2013  . PSVT (paroxysmal supraventricular tachycardia) ([place]) 12/28/2013  . Heart palpitations 07/13/2013  . Sleep apnea 04/11/2013  . Scoliosis 04/11/2013  . Atrial flutter with rapid ventricular response ([place]) 11/29/2012  . Chest pain, atypical 11/29/2012  . Fibromyalgia syndrome 11/29/2012  . Chronic steroid use 11/29/2012    Alene Mires Kyira Volkert 01/30/2021, 10:59 AM  [facility] [address] [place], [place], [address] Phone: [phone]   Fax:  [phone]  Name: Janet Nguyen MRN: [id number] Date of Birth: 1931/11/26

## 2021-01-31 ENCOUNTER — Telehealth: Payer: Self-pay | Admitting: *Deleted

## 2021-01-31 ENCOUNTER — Telehealth: Payer: Self-pay | Admitting: Oncology

## 2021-01-31 ENCOUNTER — Telehealth: Payer: Self-pay | Admitting: Internal Medicine

## 2021-01-31 NOTE — Telephone Encounter (Signed)
Received a new hem referral from Dr. Harrington Challenger for monocytosis. She saw Dr. Alen Blew in 2021 but prefers to see Dr. Julien Nordmann. Pt has been scheduled to see Dr. Julien Nordmann on 5/16 at 11:45am w/labs at 11:15am.

## 2021-01-31 NOTE — Telephone Encounter (Signed)
Returned phone call to patient, she called earlier asking about appointment clarification.  Left message for patient to call office [phone]

## 2021-02-04 DIAGNOSIS — J310 Chronic rhinitis: Secondary | ICD-10-CM | POA: Diagnosis not present

## 2021-02-04 DIAGNOSIS — G4733 Obstructive sleep apnea (adult) (pediatric): Secondary | ICD-10-CM | POA: Diagnosis not present

## 2021-02-04 DIAGNOSIS — J342 Deviated nasal septum: Secondary | ICD-10-CM | POA: Diagnosis not present

## 2021-02-04 DIAGNOSIS — K117 Disturbances of salivary secretion: Secondary | ICD-10-CM | POA: Diagnosis not present

## 2021-02-06 ENCOUNTER — Other Ambulatory Visit: Payer: Self-pay

## 2021-02-06 ENCOUNTER — Ambulatory Visit: Payer: Medicare Other

## 2021-02-06 DIAGNOSIS — M542 Cervicalgia: Secondary | ICD-10-CM | POA: Diagnosis not present

## 2021-02-06 DIAGNOSIS — M6281 Muscle weakness (generalized): Secondary | ICD-10-CM

## 2021-02-06 DIAGNOSIS — M62830 Muscle spasm of back: Secondary | ICD-10-CM | POA: Diagnosis not present

## 2021-02-06 DIAGNOSIS — M4125 Other idiopathic scoliosis, thoracolumbar region: Secondary | ICD-10-CM | POA: Diagnosis not present

## 2021-02-06 DIAGNOSIS — M545 Low back pain, unspecified: Secondary | ICD-10-CM | POA: Diagnosis not present

## 2021-02-06 DIAGNOSIS — R293 Abnormal posture: Secondary | ICD-10-CM | POA: Diagnosis not present

## 2021-02-06 NOTE — Therapy (Signed)
[facility] [address], [place], [place], [address] Phone: [phone]   Fax:  [phone]  Physical Therapy Treatment  Patient Details  Name: Janet Nguyen MRN: [id number] Date of Birth: [birth year] Referring Provider (PT): Lawerance Cruel, MD   Encounter Date: 02/06/2021   PT End of Session - 02/06/21 1108    Visit Number 7    Date for PT Re-Evaluation 02/20/21    Authorization Type Medicare  KX when needed    Progress Note Due on Visit 10    PT Start Time 1021    PT Stop Time 1104    PT Time Calculation (min) 43 min    Activity Tolerance Patient tolerated treatment well    Behavior During Therapy [facility] for tasks assessed/performed           Past Medical History:  Diagnosis Date  . Arrhythmia    History of SVT with documented PVC'S and  PAC'S  12/08/12 Nuc stress test normal LV EF 74%  Event Monitor  12/01/12-01/03/13  . Atrial flutter ([place])   . Celiac disease    treated by Dr. [name]  . GERD (gastroesophageal reflux disease)   . Intervertebral disc stenosis of neural canal of cervical region   . Irregular heart beat 11/30/12   ECHO-EF 60-65%  . Osteoporosis   . PMR (polymyalgia rheumatica) (HCC)    Dr. Marijean Bravo; pt states she was diagnosed 10-15 years ago, not treated at this time or any issues that she is aware of.  . Scoliosis   . Scoliosis   . Sleep apnea 10/02/11 [facility]   Sleep study AHI -total sleep 10.3/hr  64.0/ hr during REM sleep.RDI 22.8/hr during total sleep 64.0/hr during REM sleep The lowest O2 sat during Non-REM and REM sleep was 86% and 88% respectively. 04/08/12 CPAP/BIPAP titration study [facility]    Past Surgical History:  Procedure Laterality Date  . APPENDECTOMY     ruptured at age 27 and had surgery  . CARDIAC CATHETERIZATION  01/27/06  . cataract surgery  2015   Dr. Herbert Deaner; March & April 2015    There were no vitals filed for this  visit.   Subjective Assessment - 02/06/21 1022    Subjective I had a good weekend. I've just been vacuuming so I am tired.  The vaccuum is heavy.    Pertinent History scoliosis, sleep apnea (no CPAP)    Patient Stated Goals pain relief    Currently in Pain? Yes    Pain Score 5     Pain Location Rib cage    Pain Orientation Left    Pain Descriptors / Indicators Aching;Tightness    Pain Type Chronic pain    Pain Onset More than a month ago    Pain Frequency Intermittent    Aggravating Factors  heavy housework    Pain Relieving Factors meds, heat, massage, stretching                             [facility] Adult PT Treatment/Exercise - 02/06/21 0001      Knee/Hip Exercises: Stretches   Active Hamstring Stretch 2 reps;20 seconds    Active Hamstring Stretch Limitations seated with ankle pumps    Piriformis Stretch 2 reps;20 seconds    Piriformis Stretch Limitations seated      Manual Therapy   Manual Therapy Soft tissue mobilization;Manual Traction    Soft  tissue mobilization bil upper traps, rhobmoids, Lt pectorals, lats, posterior GH joint, Lt intercostals, and QL    Manual Traction Lt hemipelvis distraction in Rt SL                    PT Short Term Goals - 02/06/21 1024      PT SHORT TERM GOAL #1   Title Pt will be ind with initial HEP    Status Achieved      PT SHORT TERM GOAL #2   Title Pt will be instructed in spinal decompression series for pain relief and bone support/protection.    Baseline hasn't gotten on my back yet    Status On-going      PT SHORT TERM GOAL #3   Title Pt will report 20% reduction in Lt rib and lumbar/neck during the day.             PT Long Term Goals - 12/26/20 1300      PT LONG TERM GOAL #1   Title be independent in advanced HEP    Baseline --    Time 8    Period Weeks    Status New    Target Date 02/20/21      PT LONG TERM GOAL #2   Title report use of pain managemt strategies and position changes during  daily tasks to improve posture and postural endurance    Time --    Period --    Status New    Target Date 02/20/21      PT LONG TERM GOAL #3   Title Pt will report at least 50% less neck pain with use of HEP, home modalities, postural self-checks and actiivty modification as tools for pain control.    Baseline --      PT LONG TERM GOAL #4   Title --                 Plan - 02/06/21 1101    Clinical Impression Statement PT revisited LE stretches from past PT HEP and showed pt seated options for hamstring and piriformis.  Pt with main complaint of Lt side pain and neck pain today.  Pt focused manual work on Lt ribs, lats and bil neck to address areas of tension due to scoliosis.   Pt encouraged to continue with stretches.  Pt will continue to benefit from skilled PT to address muscle imbalances, tension, strength and flexibility.    PT Frequency 2x / week    PT Duration 8 weeks    PT Treatment/Interventions ADLs/Self Care Home Management;Joint Manipulations;Spinal Manipulations;Manual techniques;Neuromuscular re-education;Therapeutic exercise;Therapeutic activities;Functional mobility training;Moist Heat;Electrical Stimulation;Passive range of motion;Traction;Dry needling;Patient/family education;Cryotherapy    PT Next Visit Plan work on postural strength to pt tolerance, manual as indicated    PT Home Exercise Plan Access Code: [email or web address]- standing theraband, breathing exercises for lateral costal expansion    Consulted and Agree with Plan of Care Patient           Patient will benefit from skilled therapeutic intervention in order to improve the following deficits and impairments:  Postural dysfunction,Decreased strength,Decreased mobility,Impaired flexibility,Hypomobility,Pain,Increased muscle spasms,Increased fascial restricitons,Decreased range of motion,Decreased activity tolerance  Visit Diagnosis: Other idiopathic scoliosis, thoracolumbar region  Muscle weakness  (generalized)  Abnormal posture  Muscle spasm of back  Cervicalgia     Problem List Patient Active Problem List   Diagnosis Date Noted  . Deviated septum 05/08/2020  . Mass of subcutaneous tissue 05/02/2020  . Vertigo 06/16/2019  .  Pulmonary hypertension, unspecified ([place]) 04/14/2019  . Ischemic colitis ([place]) 04/14/2019  . Migraine with aura and without status migrainosus, not intractable 11/08/2018  . Degeneration of lumbar intervertebral disc 10/14/2018  . Hoarseness of voice 03/04/2018  . Abdominal pain 07/01/2017  . Diverticulitis, colon   . Metabolic acidosis, increased anion gap   . Constipation 04/14/2017  . Lumbar hernia 04/14/2017  . Presbycusis of both ears 01/10/2017  . Tinnitus aurium, bilateral 01/10/2017  . Gastroesophageal reflux disease 08/20/2016  . Hemoptysis 08/20/2016  . Obstructive sleep apnea of adult 08/20/2016  . Rhinitis, chronic 08/20/2016  . Throat pain in adult 08/20/2016  . Fatigue 12/23/2015  . Sciatica of right side 10/06/2015  . History of migraine headaches 10/06/2015  . Frequent PVCs 12/28/2013  . Premature atrial contractions 12/28/2013  . PSVT (paroxysmal supraventricular tachycardia) ([place]) 12/28/2013  . Heart palpitations 07/13/2013  . Sleep apnea 04/11/2013  . Scoliosis 04/11/2013  . Atrial flutter with rapid ventricular response ([place]) 11/29/2012  . Chest pain, atypical 11/29/2012  . Fibromyalgia syndrome 11/29/2012  . Chronic steroid use 11/29/2012    Sigurd Sos, PT 02/06/21 11:10 AM  [facility] [address] [place], [place], [address] Phone: [phone]   Fax:  [phone]  Name: Janet Nguyen MRN: [id number] Date of Birth: [birth year]

## 2021-02-08 ENCOUNTER — Other Ambulatory Visit: Payer: Self-pay

## 2021-02-08 ENCOUNTER — Encounter: Payer: Self-pay | Admitting: Physical Therapy

## 2021-02-08 ENCOUNTER — Ambulatory Visit: Payer: Medicare Other | Admitting: Physical Therapy

## 2021-02-08 DIAGNOSIS — M542 Cervicalgia: Secondary | ICD-10-CM | POA: Diagnosis not present

## 2021-02-08 DIAGNOSIS — M4125 Other idiopathic scoliosis, thoracolumbar region: Secondary | ICD-10-CM | POA: Diagnosis not present

## 2021-02-08 DIAGNOSIS — R293 Abnormal posture: Secondary | ICD-10-CM

## 2021-02-08 DIAGNOSIS — M6281 Muscle weakness (generalized): Secondary | ICD-10-CM | POA: Diagnosis not present

## 2021-02-08 DIAGNOSIS — M62830 Muscle spasm of back: Secondary | ICD-10-CM | POA: Diagnosis not present

## 2021-02-08 DIAGNOSIS — M545 Low back pain, unspecified: Secondary | ICD-10-CM | POA: Diagnosis not present

## 2021-02-08 NOTE — Therapy (Signed)
[facility] [address] [place], [place], [address] Phone: [phone]   Fax:  [phone]  Physical Therapy Treatment  Patient Details  Name: Janet Nguyen MRN: [id number] Date of Birth: Feb 21, 1932 Referring Provider (PT): Lawerance Cruel, MD   Encounter Date: 02/08/2021   PT End of Session - 02/08/21 1155    Visit Number 8    Date for PT Re-Evaluation 03/22/21    Authorization Type Medicare  KX when needed    Progress Note Due on Visit 10    PT Start Time 1103    PT Stop Time 1143    PT Time Calculation (min) 40 min    Activity Tolerance Patient tolerated treatment well    Behavior During Therapy [facility] for tasks assessed/performed           Past Medical History:  Diagnosis Date  . Arrhythmia    History of SVT with documented PVC'S and  PAC'S  12/08/12 Nuc stress test normal LV EF 74%  Event Monitor  12/01/12-01/03/13  . Atrial flutter ([place])   . Celiac disease    treated by Dr. [name]  . GERD (gastroesophageal reflux disease)   . Intervertebral disc stenosis of neural canal of cervical region   . Irregular heart beat 11/30/12   ECHO-EF 60-65%  . Osteoporosis   . PMR (polymyalgia rheumatica) (HCC)    Dr. Marijean Bravo; pt states she was diagnosed 10-15 years ago, not treated at this time or any issues that she is aware of.  . Scoliosis   . Scoliosis   . Sleep apnea 10/02/11 [facility]   Sleep study AHI -total sleep 10.3/hr  64.0/ hr during REM sleep.RDI 22.8/hr during total sleep 64.0/hr during REM sleep The lowest O2 sat during Non-REM and REM sleep was 86% and 88% respectively. 04/08/12 CPAP[facility]BIPAP titration study  [facility]    Past Surgical History:  Procedure Laterality Date  . APPENDECTOMY     ruptured at age 89 and had surgery  . CARDIAC CATHETERIZATION  01/27/06  . cataract surgery  2015   Dr. Herbert Deaner; March & April 2015    There were no vitals filed for this  visit.   Subjective Assessment - 02/08/21 1105    Subjective PT helps me so much.    Pertinent History scoliosis, sleep apnea (no CPAP)    Limitations House hold activities;Lifting    Patient Stated Goals pain relief    Currently in Pain? Yes    Pain Score 7     Pain Location Neck    Pain Orientation Right;Left    Pain Descriptors / Indicators Aching;Throbbing    Pain Type Chronic pain    Pain Radiating Towards my head feels full and my hearing is worse    Pain Onset More than a month ago    Pain Frequency Intermittent    Aggravating Factors  heavy housework    Pain Relieving Factors meds, heat, massage, stretching              [facility] PT Assessment - 02/08/21 0001      Assessment   Medical Diagnosis scoliosis, Lt rib pain, chronic spine pain    Referring Provider (PT) Lawerance Cruel, MD    Onset Date/Surgical Date --   years- chronic     Precautions   Precaution Comments signif scoliosis needs care in positioning for comfort      Posture/Postural Control   Postural Limitations Rounded Shoulders;Forward head;Flexed  trunk    Posture Comments significant scoliosis      AROM   Cervical - Right Rotation 50    Cervical - Left Rotation 50      Strength   Cervical Flexion 4+/5    Cervical Extension 4+/5    Cervical - Right Side Bend 4+/5    Cervical - Left Side Bend 4+/5    Cervical - Right Rotation 4+/5    Cervical - Left Rotation 4+/5      Palpation   Palpation comment improving but ongoing adaptive shorteing and TPs related to scoliosis: Lt>Rt pectorals, upper traps bil, cervical paraspinals bil, levator scap bil, rhomboids, Lt ribcage compressed and tender soft tissues obliques and intercostals, Lt diaphragm                         [facility] Adult PT Treatment/Exercise - 02/08/21 0001      Exercises   Exercises Other Exercises    Other Exercises  verbal review of HEP      Manual Therapy   Manual Therapy Soft tissue mobilization    Soft tissue  mobilization bil upper traps, levator, scalenes, proximal SCM, pectorals, rhomboids, Lt posterior GH joint, Lt intercostals and obliques                    PT Short Term Goals - 02/08/21 1153      PT SHORT TERM GOAL #1   Title Pt will be ind with initial HEP    Status Achieved      PT SHORT TERM GOAL #2   Title Pt will be instructed in spinal decompression series for pain relief and bone support/protection.    Baseline Pt understands what to do but some days too painful to lay on back    Status Achieved      PT SHORT TERM GOAL #3   Title Pt will report 20% reduction in Lt rib and lumbar/neck during the day.    Baseline some days, not others    Status Partially Met             PT Long Term Goals - 02/08/21 1154      PT LONG TERM GOAL #1   Title be independent in advanced HEP    Baseline Pt stretching and using bands for postural strength and support, self-care techniques using Calm App and pacing    Status Achieved      PT LONG TERM GOAL #2   Title report use of pain managemt strategies and position changes during daily tasks to improve posture and postural endurance    Status Achieved      PT LONG TERM GOAL #3   Title Pt will report at least 50% less neck pain with use of HEP, home modalities, postural self-checks and actiivty modification as tools for pain control.    Baseline depends on the day - sometimes yes    Status On-going                 Plan - 02/08/21 1156    Clinical Impression Statement Pt has met or partially met all STGs and is making progress towards LTG.  Pt experiences chronic pain related to severe scoliosis and benefits greatly from skilled manual PT.  She reports some days her pain is improved by at least 50% but pain intensity varies and some days are worse than others.  Her neck ROM for rotation has improved since starting PT.  She is using self-care strategies for pain management including pacing, position changes, and Calm App.   She is compliant with stretches and theraband ther ex for postural strength adn support.  She is a Pt familiar to this PT clinic and feels ready to taper to 1x/week as her pain is improving.  PT recommends 4 more visits, tapered to 1x/week for ongoing manual therapy and progression of self-care strategies/exercises for pain management to maximize her tolerance of daily activities and improve sleep.    Personal Factors and Comorbidities Age;Time since onset of injury/illness/exacerbation;Comorbidity 3+    Comorbidities fibromyalgia, scoliosis, chronic neck pain    Examination-Activity Limitations Lift;Reach Overhead;Bend;Hygiene/Grooming    Examination-Participation Restrictions Yard Work;Meal Prep;Cleaning;Community Activity;Laundry;Shop    Stability/Clinical Decision Making Evolving/Moderate complexity    Clinical Decision Making Moderate    Rehab Potential Good    PT Frequency 1x / week    PT Duration 6 weeks   4 visits w/ longer cert period in case of scheduling conflicts   PT Treatment/Interventions ADLs/Self Care Home Management;Joint Manipulations;Spinal Manipulations;Manual techniques;Neuromuscular re-education;Therapeutic exercise;Therapeutic activities;Functional mobility training;Moist Heat;Electrical Stimulation;Passive range of motion;Traction;Dry needling;Patient/family education;Cryotherapy    PT Next Visit Plan work on postural strength to pt tolerance, manual as indicated    PT Home Exercise Plan Access Code: [email or web address]- standing theraband, breathing exercises for lateral costal expansion    Consulted and Agree with Plan of Care Patient           Patient will benefit from skilled therapeutic intervention in order to improve the following deficits and impairments:  Postural dysfunction,Decreased strength,Decreased mobility,Impaired flexibility,Hypomobility,Pain,Increased muscle spasms,Increased fascial restricitons,Decreased range of motion,Decreased activity tolerance  Visit  Diagnosis: Other idiopathic scoliosis, thoracolumbar region - Plan: PT plan of care cert/re-cert  Muscle weakness (generalized) - Plan: PT plan of care cert/re-cert  Abnormal posture - Plan: PT plan of care cert/re-cert  Muscle spasm of back - Plan: PT plan of care cert/re-cert  Cervicalgia - Plan: PT plan of care cert/re-cert     Problem List Patient Active Problem List   Diagnosis Date Noted  . Deviated septum 05/08/2020  . Mass of subcutaneous tissue 05/02/2020  . Vertigo 06/16/2019  . Pulmonary hypertension, unspecified ([place]) 04/14/2019  . Ischemic colitis ([place]) 04/14/2019  . Migraine with aura and without status migrainosus, not intractable 11/08/2018  . Degeneration of lumbar intervertebral disc 10/14/2018  . Hoarseness of voice 03/04/2018  . Abdominal pain 07/01/2017  . Diverticulitis, colon   . Metabolic acidosis, increased anion gap   . Constipation 04/14/2017  . Lumbar hernia 04/14/2017  . Presbycusis of both ears 01/10/2017  . Tinnitus aurium, bilateral 01/10/2017  . Gastroesophageal reflux disease 08/20/2016  . Hemoptysis 08/20/2016  . Obstructive sleep apnea of adult 08/20/2016  . Rhinitis, chronic 08/20/2016  . Throat pain in adult 08/20/2016  . Fatigue 12/23/2015  . Sciatica of right side 10/06/2015  . History of migraine headaches 10/06/2015  . Frequent PVCs 12/28/2013  . Premature atrial contractions 12/28/2013  . PSVT (paroxysmal supraventricular tachycardia) ([place]) 12/28/2013  . Heart palpitations 07/13/2013  . Sleep apnea 04/11/2013  . Scoliosis 04/11/2013  . Atrial flutter with rapid ventricular response ([place]) 11/29/2012  . Chest pain, atypical 11/29/2012  . Fibromyalgia syndrome 11/29/2012  . Chronic steroid use 11/29/2012    Alene Mires Beuhring 02/08/2021, 12:02 PM  [facility] [address], [place], [place], [address] Phone: [phone]   Fax:  [phone]  Name: Janet Nguyen MRN: [id number] Date of Birth:  08/07/1932

## 2021-02-11 ENCOUNTER — Encounter: Payer: Self-pay | Admitting: Internal Medicine

## 2021-02-11 ENCOUNTER — Other Ambulatory Visit: Payer: Self-pay | Admitting: Medical Oncology

## 2021-02-11 ENCOUNTER — Other Ambulatory Visit: Payer: Self-pay

## 2021-02-11 ENCOUNTER — Inpatient Hospital Stay: Payer: Medicare Other

## 2021-02-11 ENCOUNTER — Inpatient Hospital Stay: Payer: Medicare Other | Attending: Internal Medicine | Admitting: Internal Medicine

## 2021-02-11 VITALS — BP 137/79 | HR 58 | Temp 97.2°F | Resp 19 | Ht 61.0 in | Wt 119.7 lb

## 2021-02-11 DIAGNOSIS — D72821 Monocytosis (symptomatic): Secondary | ICD-10-CM

## 2021-02-11 DIAGNOSIS — G4730 Sleep apnea, unspecified: Secondary | ICD-10-CM | POA: Insufficient documentation

## 2021-02-11 DIAGNOSIS — R233 Spontaneous ecchymoses: Secondary | ICD-10-CM | POA: Insufficient documentation

## 2021-02-11 DIAGNOSIS — Z9049 Acquired absence of other specified parts of digestive tract: Secondary | ICD-10-CM | POA: Diagnosis not present

## 2021-02-11 DIAGNOSIS — R232 Flushing: Secondary | ICD-10-CM | POA: Diagnosis not present

## 2021-02-11 DIAGNOSIS — K219 Gastro-esophageal reflux disease without esophagitis: Secondary | ICD-10-CM | POA: Insufficient documentation

## 2021-02-11 DIAGNOSIS — D539 Nutritional anemia, unspecified: Secondary | ICD-10-CM

## 2021-02-11 DIAGNOSIS — M419 Scoliosis, unspecified: Secondary | ICD-10-CM | POA: Diagnosis not present

## 2021-02-11 DIAGNOSIS — K900 Celiac disease: Secondary | ICD-10-CM | POA: Insufficient documentation

## 2021-02-11 DIAGNOSIS — Z79899 Other long term (current) drug therapy: Secondary | ICD-10-CM | POA: Diagnosis not present

## 2021-02-11 LAB — CMP (CANCER CENTER ONLY)
ALT: 10 U/L (ref 0–44)
AST: 21 U/L (ref 15–41)
Albumin: 3.9 g/dL (ref 3.5–5.0)
Alkaline Phosphatase: 49 U/L (ref 38–126)
Anion gap: 4 — ABNORMAL LOW (ref 5–15)
BUN: 13 mg/dL (ref 8–23)
CO2: 29 mmol/L (ref 22–32)
Calcium: 9.9 mg/dL (ref 8.9–10.3)
Chloride: 101 mmol/L (ref 98–111)
Creatinine: 0.89 mg/dL (ref 0.44–1.00)
GFR, Estimated: >60 mL/min (ref >60)
Glucose, Bld: 102 mg/dL — ABNORMAL HIGH (ref 70–99)
Potassium: 4.1 mmol/L (ref 3.5–5.1)
Sodium: 134 mmol/L — ABNORMAL LOW (ref 135–145)
Total Bilirubin: 0.6 mg/dL (ref 0.3–1.2)
Total Protein: 7 g/dL (ref 6.5–8.1)

## 2021-02-11 LAB — CBC WITH DIFFERENTIAL (CANCER CENTER ONLY)
Abs Immature Granulocytes: 0.02 10*3/uL (ref 0.00–0.07)
Basophils Absolute: 0.1 10*3/uL (ref 0.0–0.1)
Basophils Relative: 1 %
Eosinophils Absolute: 0.2 10*3/uL (ref 0.0–0.5)
Eosinophils Relative: 3 %
HCT: 35 % — ABNORMAL LOW (ref 36.0–46.0)
Hemoglobin: 11.7 g/dL — ABNORMAL LOW (ref 12.0–15.0)
Immature Granulocytes: 0 %
Lymphocytes Relative: 32 %
Lymphs Abs: 2.1 10*3/uL (ref 0.7–4.0)
MCH: 31.1 pg (ref 26.0–34.0)
MCHC: 33.4 g/dL (ref 30.0–36.0)
MCV: 93.1 fL (ref 80.0–100.0)
Monocytes Absolute: 0.9 10*3/uL (ref 0.1–1.0)
Monocytes Relative: 14 %
Neutro Abs: 3.3 10*3/uL (ref 1.7–7.7)
Neutrophils Relative %: 50 %
Platelet Count: 237 10*3/uL (ref 150–400)
RBC: 3.76 MIL/uL — ABNORMAL LOW (ref 3.87–5.11)
RDW: 13 % (ref 11.5–15.5)
WBC Count: 6.5 10*3/uL (ref 4.0–10.5)
nRBC: 0 % (ref 0.0–0.2)

## 2021-02-11 NOTE — Progress Notes (Signed)
[place] [phone]   [phone]  CONSULT NOTE  REFERRING PHYSICIAN: Dr. Melinda Crutch  REASON FOR CONSULTATION:  87 years old white female with history of monocytosis.  HPI Janet Nguyen is a 87 y.o. female with past medical history significant for multiple medical problems including history of atrial flutter, celiac disease, GERD, osteoporosis, scoliosis, sleep apnea.  The patient has a lot of gastrointestinal issues because of the scoliosis and pressure in her viscera from the bone deformity.  The patient mentions that few weeks ago she has numbness in the right third finger and a day later she has blood congestion and the right second and third fingers which resolved spontaneously.  She also had 1 episode of spitting some bright red blood but this again resolved spontaneously with no more episodes.  She had few petechiae and some episode of facial flushing.  She denied having any diarrhea but she is currently on MiraLAX for constipation.  She was seen by her primary care physician and repeat CBC showed relative monocytosis.  She had similar findings in the past and she was seen by Dr. Alen Blew and work-up was unremarkable and it was consider to be reactive monocytosis secondary to celiac disease and other comorbidities.  The patient was referred to me today for evaluation and recommendation regarding her condition. When seen today she is feeling fine with no concerning complaints except for the intermittent abdominal pain.  She denied having any current nausea, vomiting, diarrhea or constipation.  She denied having any chest pain, shortness of breath, cough or hemoptysis.  She has no recent weight loss or night sweats.  She has no headache or visual changes. Family history significant for mother with history of breast cancer and father had heart disease. The patient is divorced and has 4 children.  She used to do clerical work.  She has no history of  smoking but drinks a glass of wine on daily basis and no history of drug abuse. HPI  Past Medical History:  Diagnosis Date  . Arrhythmia    History of SVT with documented PVC'S and  PAC'S  12/08/12 Nuc stress test normal LV EF 74%  Event Monitor  12/01/12-01/03/13  . Atrial flutter ([place])   . Celiac disease    treated by Dr. [name]  . GERD (gastroesophageal reflux disease)   . Intervertebral disc stenosis of neural canal of cervical region   . Irregular heart beat 11/30/12   ECHO-EF 60-65%  . Osteoporosis   . PMR (polymyalgia rheumatica) (HCC)    Dr. Marijean Bravo; pt states she was diagnosed 10-15 years ago, not treated at this time or any issues that she is aware of.  . Scoliosis   . Scoliosis   . Sleep apnea 10/02/11 [facility]   Sleep study AHI -total sleep 10.3/hr  64.0/ hr during REM sleep.RDI 22.8/hr during total sleep 64.0/hr during REM sleep The lowest O2 sat during Non-REM and REM sleep was 86% and 88% respectively. 04/08/12 CPAP/BIPAP titration study  [facility]    Past Surgical History:  Procedure Laterality Date  . APPENDECTOMY     ruptured at age 59 and had surgery  . CARDIAC CATHETERIZATION  01/27/06  . cataract surgery  2015   Dr. Herbert Deaner; March & April 2015    Family History  Problem Relation Age of Onset  . Breast cancer Mother   . Heart disease Father   . Migraines Neg Hx  Social History Social History   Tobacco Use  . Smoking status: Never Smoker  . Smokeless tobacco: Never Used  Vaping Use  . Vaping Use: Never used  Substance Use Topics  . Alcohol use: Yes    Alcohol/week: 1.0 standard drink    Types: 1 Standard drinks or equivalent per week    Comment: drink ETOH socially  . Drug use: No    Allergies  Allergen Reactions  . Gluten Meal     Unknown  . Naproxen     Stomach upset  . Codeine Nausea Only    Current Outpatient Medications  Medication Sig Dispense Refill  . ALPRAZolam (XANAX) 0.5 MG tablet  Take 0.5 mg by mouth as needed.    Marland Kitchen amLODipine (NORVASC) 5 MG tablet TAKE ONE TABLET BY MOUTH DAILY 90 tablet 3  . clidinium-chlordiazePOXIDE (LIBRAX) 5-2.5 MG capsule Take 1 capsule by mouth as needed.     Noelle Penner ACETAMINOPHEN PO Take 500 mg by mouth as needed (pain).    Marland Kitchen FLUTICASONE PROPIONATE, NASAL, NA Place into the nose daily as needed (rhinitis).    . Lactobacillus (DIGESTIVE HEALTH PROBIOTIC PO) Take by mouth every other day.     . metoprolol succinate (TOPROL-XL) 50 MG 24 hr tablet TAKE ONE AND ONE HALF TABLETS BY MOUTH TWO TIMES A DAY 270 tablet 1  . metoprolol tartrate (LOPRESSOR) 25 MG tablet Take 25 mg by mouth. Uses rarely as needed for atrial flutters    . Multiple Vitamins-Minerals (PRESERVISION AREDS 2 PO) Take 1 tablet by mouth every other day.     . pantoprazole (PROTONIX) 20 MG tablet Take 20 mg by mouth daily. Once or twice a day    . Phenylephrine-APAP-guaiFENesin (EQ SINUS CONGESTION & PAIN PO) Take by mouth as needed (for sinus pain). Contains acetaminophen 325 mg and Phenylephrine 5 mg    . Polyvinyl Alcohol-Povidone (REFRESH OP) Apply to eye at bedtime.     Marland Kitchen UNABLE TO FIND Take by mouth. Med Name: [place] WOMEN'S GUMMIES ONCE-TWICE DAILY    . UNABLE TO FIND Med Name: VITAFUSION CALCIUM PLUS D CHEWABLES, ONCE-TWICE DAILY    . UNABLE TO FIND Med Name: VITAFUSION OMEGA-3, ONCE-TWICE DAILY    . UNABLE TO FIND 1,000 mcg daily. Med Name: [place] BIOTIN    . UNABLE TO FIND 2 (two) times daily. Med Name: EQUATE FIBER POWDER/MIRALAX    . UNABLE TO FIND as needed (itching ears). Med Name: FLUOCINONIDE TOPICAL SOLUTION (DROPS)    . ZOLPIDEM TARTRATE ER PO Take 2.5 mg by mouth at bedtime.      No current facility-administered medications for this visit.    Review of Systems  Constitutional: negative Eyes: negative Ears, nose, mouth, throat, and face: negative Respiratory: negative Cardiovascular: negative Gastrointestinal: positive for change in bowel  habits Genitourinary:negative Integument/breast: negative Hematologic/lymphatic: negative Musculoskeletal:positive for back pain Neurological: negative Behavioral/Psych: negative Endocrine: negative Allergic/Immunologic: negative  Physical Exam  MQK:MMNOT, healthy, no distress, well nourished and well developed SKIN: skin color, texture, turgor are normal, no rashes or significant lesions HEAD: Normocephalic, No masses, lesions, tenderness or abnormalities EYES: normal, PERRLA, Conjunctiva are pink and non-injected EARS: External ears normal, Canals clear OROPHARYNX:no exudate, no erythema and lips, buccal mucosa, and tongue normal  NECK: supple, no adenopathy, no JVD LYMPH:  no palpable lymphadenopathy, no hepatosplenomegaly BREAST:not examined LUNGS: clear to auscultation , and palpation HEART: regular rate & rhythm, no murmurs and no gallops ABDOMEN:abdomen soft, non-tender, normal bowel sounds and no masses or  organomegaly BACK: Significant scoliosis EXTREMITIES:no edema  NEURO: alert & oriented x 3 with fluent speech, no focal motor/sensory deficits  PERFORMANCE STATUS: ECOG 1  LABORATORY DATA: Lab Results  Component Value Date   WBC 6.5 02/11/2021   HGB 11.7 (L) 02/11/2021   HCT 35.0 (L) 02/11/2021   MCV 93.1 02/11/2021   PLT 237 02/11/2021      Chemistry      Component Value Date/Time   NA 134 (L) 02/11/2021 1137   K 4.1 02/11/2021 1137   CL 101 02/11/2021 1137   CO2 29 02/11/2021 1137   BUN 13 02/11/2021 1137   CREATININE 0.89 02/11/2021 1137   CREATININE 0.85 12/03/2016 0845      Component Value Date/Time   CALCIUM 9.9 02/11/2021 1137   ALKPHOS 49 02/11/2021 1137   AST 21 02/11/2021 1137   ALT 10 02/11/2021 1137   BILITOT 0.6 02/11/2021 1137       RADIOGRAPHIC STUDIES: No results found.  ASSESSMENT: This is a very pleasant 87 years old white female with multiple medical problems including history of scoliosis as well as celiac disease with  history of relative and reactive monocytosis in the past.   PLAN: I had a lengthy discussion with the patient today about her current condition. Repeat CBC today showed normal white blood count as well as normal absolute and relative monocyte count.  The patient has mild normocytic anemia I recommended for the patient to continue on observation as there is no clear abnormality in her blood work except for the mild anemia that could be secondary to malabsorption issues from the celiac disease. I will see her back for follow-up visit in 3 months for evaluation and repeat blood work including chromogranin A because of the facial flushing she had to rule out any underlying neuroendocrine disorder. The patient was advised to call immediately if she has any concerning symptoms in the interval. The patient voices understanding of current with history of status and treatment options and is in agreement with the current care plan.  All questions were answered. The patient knows to call the clinic with any problems, questions or concerns. We can certainly see the patient much sooner if necessary.  Thank you so much for allowing me to participate in the care of Janet Nguyen. I will continue to follow up the patient with you and assist in her care.  The total time spent in the appointment was 60 minutes.  Disclaimer: This note was dictated with voice recognition software. Similar sounding words can inadvertently be transcribed and may not be corrected upon review.   Eilleen Kempf Feb 11, 2021, 12:16 PM

## 2021-02-13 ENCOUNTER — Telehealth: Payer: Self-pay | Admitting: Medical Oncology

## 2021-02-13 ENCOUNTER — Other Ambulatory Visit: Payer: Self-pay

## 2021-02-13 ENCOUNTER — Ambulatory Visit: Payer: Medicare Other | Admitting: Physical Therapy

## 2021-02-13 ENCOUNTER — Encounter: Payer: Self-pay | Admitting: Physical Therapy

## 2021-02-13 ENCOUNTER — Ambulatory Visit (INDEPENDENT_AMBULATORY_CARE_PROVIDER_SITE_OTHER): Payer: Medicare Other | Admitting: Cardiovascular Disease

## 2021-02-13 ENCOUNTER — Encounter: Payer: Self-pay | Admitting: Cardiovascular Disease

## 2021-02-13 VITALS — BP 116/56 | Ht 61.0 in | Wt 121.6 lb

## 2021-02-13 DIAGNOSIS — I10 Essential (primary) hypertension: Secondary | ICD-10-CM

## 2021-02-13 DIAGNOSIS — R293 Abnormal posture: Secondary | ICD-10-CM

## 2021-02-13 DIAGNOSIS — G4733 Obstructive sleep apnea (adult) (pediatric): Secondary | ICD-10-CM

## 2021-02-13 DIAGNOSIS — M4125 Other idiopathic scoliosis, thoracolumbar region: Secondary | ICD-10-CM | POA: Diagnosis not present

## 2021-02-13 DIAGNOSIS — M62830 Muscle spasm of back: Secondary | ICD-10-CM

## 2021-02-13 DIAGNOSIS — I8390 Asymptomatic varicose veins of unspecified lower extremity: Secondary | ICD-10-CM | POA: Diagnosis not present

## 2021-02-13 DIAGNOSIS — G8929 Other chronic pain: Secondary | ICD-10-CM

## 2021-02-13 DIAGNOSIS — M542 Cervicalgia: Secondary | ICD-10-CM

## 2021-02-13 DIAGNOSIS — M545 Low back pain, unspecified: Secondary | ICD-10-CM | POA: Diagnosis not present

## 2021-02-13 DIAGNOSIS — I471 Supraventricular tachycardia, unspecified: Secondary | ICD-10-CM

## 2021-02-13 DIAGNOSIS — I872 Venous insufficiency (chronic) (peripheral): Secondary | ICD-10-CM

## 2021-02-13 DIAGNOSIS — I340 Nonrheumatic mitral (valve) insufficiency: Secondary | ICD-10-CM

## 2021-02-13 DIAGNOSIS — M6281 Muscle weakness (generalized): Secondary | ICD-10-CM

## 2021-02-13 MED ORDER — METOPROLOL SUCCINATE ER 50 MG PO TB24: ORAL_TABLET | ORAL | 3 refills | Status: DC

## 2021-02-13 NOTE — Therapy (Signed)
[facility] [address] [place], [place], [address] Phone: [phone]   Fax:  [phone]  Physical Therapy Treatment  Patient Details  Name: Janet Nguyen MRN: [id number] Date of Birth: Apr 30, 1932 Referring Provider (PT): Lawerance Cruel, MD   Encounter Date: 02/13/2021   PT End of Session - 02/13/21 1144    Visit Number 9    Date for PT Re-Evaluation 03/22/21    Authorization Type Medicare  KX when needed    Progress Note Due on Visit 10    PT Start Time 1105    PT Stop Time 1143    PT Time Calculation (min) 38 min    Activity Tolerance Patient tolerated treatment well    Behavior During Therapy [facility] for tasks assessed/performed           Past Medical History:  Diagnosis Date  . Arrhythmia    History of SVT with documented PVC'S and  PAC'S  12/08/12 Nuc stress test normal LV EF 74%  Event Monitor  12/01/12-01/03/13  . Atrial flutter ([place])   . Celiac disease    treated by Dr. [name]  . GERD (gastroesophageal reflux disease)   . Intervertebral disc stenosis of neural canal of cervical region   . Irregular heart beat 11/30/12   ECHO-EF 60-65%  . Osteoporosis   . PMR (polymyalgia rheumatica) (HCC)    Dr. Marijean Bravo; pt states she was diagnosed 10-15 years ago, not treated at this time or any issues that she is aware of.  . Scoliosis   . Scoliosis   . Sleep apnea 10/02/11 [facility]   Sleep study AHI -total sleep 10.3/hr  64.0/ hr during REM sleep.RDI 22.8/hr during total sleep 64.0/hr during REM sleep The lowest O2 sat during Non-REM and REM sleep was 86% and 88% respectively. 04/08/12 CPAP/BIPAP titration study [facility]    Past Surgical History:  Procedure Laterality Date  . APPENDECTOMY     ruptured at age 61 and had surgery  . CARDIAC CATHETERIZATION  01/27/06  . cataract surgery  2015   Dr. Herbert Deaner; March & April 2015    There were no vitals filed for this  visit.   Subjective Assessment - 02/13/21 1107    Subjective My pain is up and down all the time.  I am tired from all the medical appointments.    Pertinent History scoliosis, sleep apnea (no CPAP)    Limitations House hold activities;Lifting    Patient Stated Goals pain relief    Currently in Pain? Yes    Pain Location Neck    Pain Orientation Upper;Left;Right;Mid;Lower    Pain Type Chronic pain    Pain Radiating Towards mid back and low back    Pain Onset More than a month ago    Pain Frequency Intermittent    Aggravating Factors  heavier housework    Pain Relieving Factors meds, heat, massage, stretching                             [facility] Adult PT Treatment/Exercise - 02/13/21 0001      Manual Therapy   Manual Therapy Soft tissue mobilization;Passive ROM    Soft tissue mobilization all seated today: Lt QL, myofascial release bil obliques and intercostals regions, cervical paraspinals, upper traps, levators, rhomboids, pectorals    Passive ROM passive pec stretch in chair for thoracic and GH joint postural stretch  PT Short Term Goals - 02/08/21 1153      PT SHORT TERM GOAL #1   Title Pt will be ind with initial HEP    Status Achieved      PT SHORT TERM GOAL #2   Title Pt will be instructed in spinal decompression series for pain relief and bone support/protection.    Baseline Pt understands what to do but some days too painful to lay on back    Status Achieved      PT SHORT TERM GOAL #3   Title Pt will report 20% reduction in Lt rib and lumbar/neck during the day.    Baseline some days, not others    Status Partially Met             PT Long Term Goals - 02/08/21 1154      PT LONG TERM GOAL #1   Title be independent in advanced HEP    Baseline Pt stretching and using bands for postural strength and support, self-care techniques using Calm App and pacing    Status Achieved      PT LONG TERM GOAL #2   Title report  use of pain managemt strategies and position changes during daily tasks to improve posture and postural endurance    Status Achieved      PT LONG TERM GOAL #3   Title Pt will report at least 50% less neck pain with use of HEP, home modalities, postural self-checks and actiivty modification as tools for pain control.    Baseline depends on the day - sometimes yes    Status On-going                 Plan - 02/13/21 1145    Clinical Impression Statement Pt arrived with increased pain rating 6/10 today compared to last visit.  She has been busy with outings to various medical appointments and grows fatigued.  Overall tissue tension is improved along bil upper traps.  She continues to benefit from skilled palpation and STM for elongation along spine and for more upright posture in presence of severe scoliosis.  Continue once a week along POC.    Personal Factors and Comorbidities Age;Time since onset of injury/illness/exacerbation;Comorbidity 3+    Comorbidities fibromyalgia, scoliosis, chronic neck pain    Stability/Clinical Decision Making Evolving/Moderate complexity    Rehab Potential Good    PT Frequency 1x / week    PT Duration 6 weeks    PT Treatment/Interventions ADLs/Self Care Home Management;Joint Manipulations;Spinal Manipulations;Manual techniques;Neuromuscular re-education;Therapeutic exercise;Therapeutic activities;Functional mobility training;Moist Heat;Electrical Stimulation;Passive range of motion;Traction;Dry needling;Patient/family education;Cryotherapy    PT Next Visit Plan 10th visit PN next time    PT Home Exercise Plan Access Code: [email or web address]- standing theraband, breathing exercises for lateral costal expansion    Consulted and Agree with Plan of Care Patient           Patient will benefit from skilled therapeutic intervention in order to improve the following deficits and impairments:  Postural dysfunction,Decreased strength,Decreased mobility,Impaired  flexibility,Hypomobility,Pain,Increased muscle spasms,Increased fascial restricitons,Decreased range of motion,Decreased activity tolerance  Visit Diagnosis: Other idiopathic scoliosis, thoracolumbar region  Muscle weakness (generalized)  Abnormal posture  Muscle spasm of back  Cervicalgia  Chronic bilateral low back pain without sciatica     Problem List Patient Active Problem List   Diagnosis Date Noted  . Deviated septum 05/08/2020  . Mass of subcutaneous tissue 05/02/2020  . Vertigo 06/16/2019  . Pulmonary hypertension, unspecified ([place]) 04/14/2019  . Ischemic colitis ([place])  04/14/2019  . Migraine with aura and without status migrainosus, not intractable 11/08/2018  . Degeneration of lumbar intervertebral disc 10/14/2018  . Hoarseness of voice 03/04/2018  . Abdominal pain 07/01/2017  . Diverticulitis, colon   . Metabolic acidosis, increased anion gap   . Constipation 04/14/2017  . Lumbar hernia 04/14/2017  . Presbycusis of both ears 01/10/2017  . Tinnitus aurium, bilateral 01/10/2017  . Gastroesophageal reflux disease 08/20/2016  . Hemoptysis 08/20/2016  . Obstructive sleep apnea of adult 08/20/2016  . Rhinitis, chronic 08/20/2016  . Throat pain in adult 08/20/2016  . Fatigue 12/23/2015  . Sciatica of right side 10/06/2015  . History of migraine headaches 10/06/2015  . Frequent PVCs 12/28/2013  . Premature atrial contractions 12/28/2013  . PSVT (paroxysmal supraventricular tachycardia) ([place]) 12/28/2013  . Heart palpitations 07/13/2013  . Sleep apnea 04/11/2013  . Scoliosis 04/11/2013  . Atrial flutter with rapid ventricular response ([place]) 11/29/2012  . Chest pain, atypical 11/29/2012  . Fibromyalgia syndrome 11/29/2012  . Chronic steroid use 11/29/2012    Baruch Merl, PT 02/13/21 12:55 PM   [facility] [address], [place], [place], [address] Phone: [phone]   Fax:   [phone]  Name: Janet Nguyen MRN: [id number] Date of Birth: 05-10-1932

## 2021-02-13 NOTE — Patient Instructions (Signed)
Medication Instructions:  Metoprolol refilled.   Your physician recommends that you continue on your current medications as directed. Please refer to the Current Medication list given to you today.  *If you need a refill on your cardiac medications before your next appointment, please call your pharmacy*   Lab Work: None ordered.   Testing/Procedures: None ordered.    Follow-Up: At [facility], you and your health needs are our priority.  As part of our continuing mission to provide you with exceptional heart care, we have created designated Provider Care Teams.  These Care Teams include your primary Cardiologist (physician) and Advanced Practice Providers (APPs -  Physician Assistants and Nurse Practitioners) who all work together to provide you with the care you need, when you need it.  We recommend signing up for the patient portal called "MyChart".  Sign up information is provided on this After Visit Summary.  MyChart is used to connect with patients for Virtual Visits (Telemedicine).  Patients are able to view lab/test results, encounter notes, upcoming appointments, etc.  Non-urgent messages can be sent to your provider as well.   To learn more about what you can do with MyChart, go to [email or web address].    Your next appointment:   12 month(s)  The format for your next appointment:   In Person  Provider:   Shelva Majestic, MD

## 2021-02-13 NOTE — Progress Notes (Signed)
50,000 patient ID: Janet Nguyen, female   DOB: [birth year], 87 y.o.   MRN: [id number]     Primary  M.D.: Dr. Mardene Sayer  HPI: Janet Nguyen is a 87 y.o. female who presents for a 6  month followup cardiology evaluation.   Janet Nguyen has a history of documented SVT and also has PACs and PVCs  treated with beta blocker therapy. In April 2014 I further titrated her beta blocker therapy after cardiac event monitor revealed several bursts of recurrent SVT up to 177 beats per minute in March.  In July after she had had 2 episodes of chest fluttering which each lasted over 30 minutes which he did take metoprolol tartrate with relief I recommended further titration of her Toprol to 75 mg in the morning and 50 mg at night and if necessary she could further titrate this to 75 twice a day.  She has a history of obstructive sleep apnea but despite multiple attempts at CPAP utilization she has not been able to tolerate this. She was referred to Dr. [name] and has a customized dental appliance with mandibular advancement with improvement in some of her symptomatology. Due concern that her teeth may be moving in more recently she has has not been using customized appliance daily but has been using her old non-customized mouthguard.  At times shen has awakened abruptly from a dream with her heart pounding and a sensation of hot flashes, gasping for breath.   She also states that her scoliosis is getting worse. She does have left-sided musculoskeletal type chest pain due to her spine angulation.  Beause of her significant scoliosis, she feels she must sleep on her back.   She has a history of GERD for which she has been taking Nexium.  She presents for evaluation.  I had scheduled her for an overnight oximetry to see if she is a candidate for supplemental oxygen at nighttime since she refused to use CPAP and only very rarely uses her customized mouthpiece.  She does wear a mouthguard to reduce  bruxism.  Oximetry study was performed overnight on February 23/24.  Her mean oxygen saturation was 93.12%.  She spent 12 minutes and 16 seconds with O2 sat duration below 88% with the lowest O2 sat duration of 80%. I tried to set her up for supplemental oxygen at bedtime.  However, she has been denied for this on multiple occasions by her insurance/Medicare.  She feels that she is sleeping better.  She can only sleep in her back due to scoliosis.  Her left side is concave; her right side is convex.  She has had issues with labile blood pressure. She has had issues with lower back discomfort and sciatica leading to emergency room evaluation in November 2016.  She saw Dr. [name] for neurosurgical evaluation. She has migraine headaches. She  Recently saw Dr. [name] for these migraine headaches.  She has been on Toprol-XL 75 mg twice a day for her palpitations and presently denies any awareness an extra heartbeats. She takes Xanax on an as-needed basis for anxiety.  She has been taking Nexium 20, no grams every other day for GERD. She states her scoliosis is getting worse.  Has been experiencing more fatigue.  She also notes some occasional left hand numbness.  In a mouth guard but no ureteral longer uses her mandibular advancement device and not tolerate CPAP therapy for her obstructive sleep apnea.    She had experienced left-sided chest discomfort which most  likely was related to her significant scoliosis the potential neuropathy causing intermittent left arm and hand numbness.  I slightly reduced her Toprol-XL which she had been taking 150 mg daily and a 75 twice a day regimen to 75 and milligrams in the morning and 50 mg with ultimate plan to decrease this to 50 twice a day.  She states when she reduce this, she began to notice more optical migraines and resume taking the higher dose Toprol at 75 twice a day with improvement.  She has seen Dr. [name] for her paresthesias.  She admits to significant anxiety.   She had requested zolpidem for sleep initiation and maintenance.  In the past, we had tried 6.25 slow-release version to help with sleep maintenance, but due to cost issues preferred the 5 mg sleep initiation dose.  She experiences optical migraine headaches.  She was being seen by neurologist, Dr. Melton Alar  She continues to have issues with her scoliosis causing discomfort in her neck and arm due to her distortion.  He tells me she underwent endoscopy and colonoscopy as well as banding of hemorrhoids.  She had a benign polyp removed.  She has issues with spastic colon.  Her sleep is better with zolpiden 5 mg.  She saw Kerin Ransom on 11/13/2016.  She had noticed that her hair was "thinning" and was concerned about this being the result of Toprol.  He suggested possibly decreasing Toprol but she preferred not until she had seen me.  Since that time, she continues to experience some optical migraines.  She has noticed some leg left neck discomfort.  She denies any patches of hair loss.  She denies differential arm weakness.  She continues to have difficulty with her scoliosis with hip pain and rib cage discomfort.   When I saw her in March 2018, there was a blood pressure differential of 118/78 in the left arm and 140/80 in the right arm.  She subsequently underwent upper extremity Doppler evaluation on 12/25/2016.  She was noted have mild heterogeneous plaque with stable 1-39% bilateral internal carotid stenoses.  She had normal subclavian arteries bilaterally.  There are patent vertebral arteries with antegrade flow.  She did not have any significant blood pressure differential and had triphasic waveforms in both her right and biphasic in her left brachial artery.  Left blood pressure was 8 mm higher than the right brachial pressure.  At times, she continues to experience some vague chest wall symptoms, which most likely related to her posture.  She denies significant shortness of breath.  She has to sleep on  her back because of her scoliosis.  Uses a chin strap to prevent oral breathing.  She is not on CPAP.   In June 2019 she had concerns about some of her medications possibly causing hair loss or memory loss.  She admits to having dry eyes.  She continues to have difficulty from her scolWsaw her in November 2019 at which time she,  felt well from a cardiac standphk and apparently had a new oral appliance made.  She used this initially but then started to notice some teeth discomfort.  Apparently this again was treated by Dr.e still admits to being tired.  She has issues with her abdomen being tight which she believes is related to her scoliosis.  Her GERD is controlled with pantoprazole.    She was seen by Dr. Mardene Sayer for her primary care.  She had developed a fungal infection of her toenail.  He  had given her to Crowder fine 20 mg.  She has not started this and was hesitant to do this due to potential interaction with metoprolol.  She also was evaluated by Dr. Brett Fairy.  According to Ms. Mazo she did not evaluate her ophthalmic migraines.  However she was planning to do a possible home study to reassess her sleep apnea.  In the past she did not tolerate any CPAP therapy and although initially tolerated customized oral appliance this seemed to cause difficulty with movement of her teeth.  She has been using the CALM app on her smart phone to help with relaxation and improve sleep and is chronically dependent on low-dose zolpidem prescribed by Dr. Harrington Challenger.  She denies chest pain.  Her palpitations are well controlled with her current dose of metoprolol 75 mils twice a day and she can to be on low-dose amlodipine 2.5 mg.    Since I last saw her in June 2020, she underwent the home study by Dr. Theodoro Clock was done after several studies with no data.  According to Dr. Edwena Felty note, AHI was 26.5 which was moderate and during REM sleep AHI rose to 34.9.  Snoring was not recorded.  Apparently there was  discussion of possible Financial planner.  Ms. Bow is not interested.  I last saw her in October 2020 at which time she had continued issues with migraines and some vertigo for which he takes the Epley maneuver.  She continued to have discomfort related to her scoliosis with muscle discomfort on her chest.  She believes she is waking up during dreaming and not sleeping well.  Her blood pressure has increased.  At her prior evaluation, I had instituted low-dose amlodipine at 2.5 mg.  With her blood pressure elevated during that evaluation I further titrated this to 5 mg I recommended she continue taking Toprol XL 75 mg twice a day.  She did not have any episodes of palpitations or recurrent SVT.  I  saw her in January 2021. She had seen  Dr. Jaynee Eagles of neurology.  She continued to experience some muscular neck aches which may be related to her scoliosis but do not sound ischemic in etiology.  She states she has some varicose veins in the right lower extremity and complains of trivial ankle swelling.  She denies palpitations.    I last saw her in April 2021.  Over the previous several months she had felt well from a cardiovascular standpoint.  She continues to have issues with her scoliosis causing back issues as well as some left-sided discomfort along her rib cage.   She has sleep apnea and remotely did not tolerate CPAP or an oral appliance  She continues to be on amlodipine 5 mg daily, metoprolol succinate 75 mg twice a day both for blood pressure and her palpitations. GERD is fairly well controlled on pantoprazole. She continues to be on Librax which she states does offer improvement and she takes alprazolam on a as needed basis.  I last saw her in October 2021 and since her prior evaluation she had undergone neurology evaluation by Dr. Tomi Likens and has had migraine issues with aura since young adulthood.  An MRI of the brain in 2017 showed mild chronic small vessel ischemic changes otherwise was  unremarkable.  She had developed swelling in her leg right greater than left and has varicose veins.  She underwent lower extremity venous study which revealed no DVT but there was evidence for venous reflux in the right saphenofemoral junction,  right greater saphenous vein in the thigh and right greater saphenous vein in the calf.  She has used compression stockings in the past.  There was no plan for intervention.  She has been undergoing physical therapy.  There is no change in her intermittent rib discomfort from her scoliosis.  She denies palpitations.    Over the past 6 months, she continues to have issues with her rib cage and hip bone discomfort contributed by her scoliosis.  She has had issues with varicose veins.  She has to sleep on her side.  She was recently evaluated by Dr. Earlie Server for elevated monocytes who felt she was stable hematologically.  She denies any angina.  She is unaware of palpitations and continues to be on Toprol all succinate 75 mg twice a day.  She is also on amlodipine 5 mg for hypertension.  She has a prescription for alprazolam and rarely if ever takes this for anxiety.  She presents for evaluation.  Past Medical History:  Diagnosis Date  . Arrhythmia    History of SVT with documented PVC'S and  PAC'S  12/08/12 Nuc stress test normal LV EF 74%  Event Monitor  12/01/12-01/03/13  . Atrial flutter ([place])   . Celiac disease    treated by Dr. [name]  . GERD (gastroesophageal reflux disease)   . Intervertebral disc stenosis of neural canal of cervical region   . Irregular heart beat 11/30/12   ECHO-EF 60-65%  . Osteoporosis   . PMR (polymyalgia rheumatica) ([facility])    Dr. Marijean Bravo; pt states she was diagnosed 10-15 years ago, not treated at this time or any issues that she is aware of.  . Scoliosis   . Scoliosis   . Sleep apnea 10/02/11 [facility]   Sleep study AHI -total sleep 10.3/hr  64.0/ hr during REM sleep.RDI 22.8/hr during total sleep 64.0/hr during  REM sleep The lowest O2 sat during Non-REM and REM sleep was 86% and 88% respectively. 04/08/12 CPAP/BIPAP titration study [facility]    Past Surgical History:  Procedure Laterality Date  . APPENDECTOMY     ruptured at age 73 and had surgery  . CARDIAC CATHETERIZATION  01/27/06  . cataract surgery  2015   Dr. Herbert Deaner; March & April 2015    Allergies  Allergen Reactions  . Gluten Meal     Unknown  . Naproxen     Stomach upset  . Codeine Nausea Only    Current Outpatient Medications  Medication Sig Dispense Refill  . ALPRAZolam (XANAX) 0.5 MG tablet Take 0.5 mg by mouth as needed.    Marland Kitchen amLODipine (NORVASC) 5 MG tablet TAKE ONE TABLET BY MOUTH DAILY 90 tablet 3  . clidinium-chlordiazePOXIDE (LIBRAX) 5-2.5 MG capsule Take 1 capsule by mouth as needed.    Noelle Penner ACETAMINOPHEN PO Take 500 mg by mouth as needed (pain).    Marland Kitchen FLUTICASONE PROPIONATE, NASAL, NA Place into the nose daily as needed (rhinitis).    . Multiple Vitamins-Minerals (PRESERVISION AREDS 2 PO) Take 1 tablet by mouth every other day.     Marland Kitchen Phenylephrine-APAP-guaiFENesin (EQ SINUS CONGESTION & PAIN PO) Take by mouth as needed (for sinus pain). Contains acetaminophen 325 mg and Phenylephrine 5 mg    . UNABLE TO FIND Take by mouth. Med Name: [place] WOMEN'S GUMMIES ONCE-TWICE DAILY    . UNABLE TO FIND Med Name: VITAFUSION CALCIUM PLUS D CHEWABLES, ONCE-TWICE DAILY    . UNABLE TO FIND Med Name: El[place]ora Gamma,  ONCE-TWICE DAILY    . UNABLE TO FIND 1,000 mcg daily. Med Name: [place] BIOTIN    . UNABLE TO FIND 2 (two) times daily. Med Name: EQUATE FIBER POWDER/MIRALAX    . UNABLE TO FIND as needed (itching ears). Med Name: FLUOCINONIDE TOPICAL SOLUTION (DROPS)    . ZOLPIDEM TARTRATE ER PO Take 2.5 mg by mouth at bedtime.    . metoprolol succinate (TOPROL-XL) 50 MG 24 hr tablet TAKE ONE AND ONE HALF TABLETS BY MOUTH TWO TIMES A DAY 270 tablet 3   No current facility-administered medications for  this visit.    Socially she is divorced has 4 children 9 grandchildren. She does exercise. No tobacco use. She does occasional wine.  ROS General: Negative; No fevers, chills, or night sweats;  HEENT: Negative; No changes in vision or hearing, sinus congestion, difficulty swallowing Pulmonary: Negative; No cough, wheezing, shortness of breath, hemoptysis Cardiovascular: Positive for occasional chest wall pain and nocturnal palpitations GI: Negative; No nausea, vomiting, diarrhea, or abdominal pain GU: Negative; No dysuria, hematuria, or difficulty voiding Musculoskeletal: Positive for significant scoliosis; fibromyalgia;  joint pain, or weakness Hematologic/Oncology: Negative; no easy bruising, bleeding Endocrine: Negative; no heat/cold intolerance; no diabetes Neuro: History of migraine headaches Skin: Positive for fungal infection of her toenail Psychiatric: Negative; No behavioral problems, depression Sleep: Positive for sleep apnea ; No snoring, daytime sleepiness, hypersomnolence, bruxism, restless legs, hypnogognic hallucinations, no cataplexy Other comprehensive 14 point system review is negative.  PE BP (!) 116/56   Ht _0  (1.549 m)   Wt 121 lb 9.6 oz (55.2 kg)   BMI 22.98 kg/m    Repeat blood pressure by me 118/60  Wt Readings from Last 3 Encounters:  02/13/21 121 lb 9.6 oz (55.2 kg)  02/11/21 119 lb 11.2 oz (54.3 kg)  07/17/20 117 lb 9.6 oz (53.3 kg)   General: Alert, oriented, no distress.  Skin: normal turgor, no rashes, warm and dry HEENT: Normocephalic, atraumatic. Pupils equal round and reactive to light; sclera anicteric; extraocular muscles intact;  Nose without nasal septal hypertrophy Mouth/Parynx benign; Mallinpatti scale 3 Neck: No JVD, no carotid bruits; normal carotid upstroke Lungs: clear to ausculatation and percussion; no wheezing or rales Chest wall: without tenderness to palpitation Heart: PMI not displaced, RRR, s1 s2 normal, 1/6 systolic  murmur, no diastolic murmur, no rubs, gallops, thrills, or heaves Abdomen: soft, nontender; no hepatosplenomehaly, BS+; abdominal aorta nontender and not dilated by palpation. Back: Significant scoliosis; no CVA tenderness Pulses 2+ Musculoskeletal: full range of motion, normal strength, no joint deformities Extremities: Varicose veins; no clubbing cyanosis or edema, Homan's sign negative  Neurologic: grossly nonfocal; Cranial nerves grossly wnl Psychologic: Normal mood and affect   ECG (independently read by me): Sinus bradycardia at 59 with mild arhythmia  October 2021 ECG (independently read by me): Sinus rhythm at 67 with mild arrythmia; norma intervals  April 2021 ECG (independently read by me): Normal sinus rhythm at 69 bpm. No ectopy. Normal intervals.  October 18, 2019 ECG (independently read by me): Sinus bradycardia 59 bpm.  QS complex V1 V2.  Normal intervals.  No ectopy  October 2020 ECG (independently read by me): Normal sinus rhythm at 63 bpm.  QS complex V1 V2.  No ectopy.  Normal intervals.  June 2020 ECG (independently read by me): NSR at 62; no ectopy; normal intervals   November 2019 ECG (independently read by me): Normal sinus rhythm with PAC.  Ventricular rate 66.  QS V1 V2.  Mild T  wave abnormality  June 2019 ECG (independently read by me): Normal sinus rhythm at 64 bpm.  Normal intervals.  No ectopy.  September 2018 ECG (independently read by me): Normal sinus rhythm at 64 bpm.  Isolated PAC.  QTc interval 414 ms.  QS V1 V2, unchanged.  May 2018 ECG (independently read by me): Normal sinus rhythm at 65 bpm.  QS V1, V2.  Normal intervals.  No ST segment changes.  March 2018 ECG (independently read by me): Normal sinus rhythm at 65 bpm.  No ectopy.  Normal intervals.  September 2017 ECG (independently read by me): Normal sinus rhythm at 62 bpm.  QS V1 and V2.  Normal intervals.  April 2017 ECG (independently read by me): Normal sinus rhythm at 70 bpm.  No  ectopy.  PR interval 158 ms and QTc interval 414 ms.  March 2017 ECG (independently read by me): normal sinus rhythm at 61 bpm..  No ST segment changes.  Normal intervals.  QTc interval 396 ms.  August 2014 ECG (independently read by me): Normal sinus rhythm at 63 bpm.  QRS couplets V1 V2.  No cigarette ST-T changes.  May 2016 ECG (independently read by me): Sinus bradycardia 59 bpm.  No ectopy.  February 2016 ECG (independently read by me): Sinus bradycardia 56 bpm.  Normal intervals.  Prior ECG (independently read by me): Normal sinus rhythm at 62 beats per minute.  QTc interval 411 milliseconds.  QRS complex V1, V2.  Normal intervals.  ECG (independently read by me): Normal sinus rhythm at 62 beats per minute. Normal intervals. QTc interval 399 ms.  Prior ECG of 07/13/2013: Sinus rhythm at 61 beats per minute. QTc interval 406 ms. PR interval normal at 168 ms.  LABS: BMP Latest Ref Rng & Units 02/11/2021 12/05/2017 07/02/2017  Glucose 70 - 99 mg/dL 102(H) 129(H) 88  BUN 8 - 23 mg/dL _0 Creatinine 0.44 - 1.00 mg/dL 0.89 0.87 0.95  Sodium 135 - 145 mmol/L 134(L) 136 139  Potassium 3.5 - 5.1 mmol/L 4.1 3.8 3.8  Chloride 98 - 111 mmol/L 101 100(L) 109  CO2 22 - 32 mmol/L _1 Calcium 8.9 - 10.3 mg/dL 9.9 9.6 8.7(L)   Hepatic Function Latest Ref Rng & Units 02/11/2021 12/05/2017 07/01/2017  Total Protein 6.5 - 8.1 g/dL 7.0 7.6 7.2  Albumin 3.5 - 5.0 g/dL 3.9 4.4 4.4  AST 15 - 41 U/L _2 ALT 0 - 44 U/L _3 Alk Phosphatase 38 - 126 U/L 49 55 40  Total Bilirubin 0.3 - 1.2 mg/dL 0.6 0.6 1.3(H)  Bilirubin, Direct 0.1 - 0.5 mg/dL - - 0.1   CBC Latest Ref Rng & Units 02/11/2021 04/20/2020 12/05/2017  WBC 4.0 - 10.5 K/uL 6.5 8.7 9.2  Hemoglobin 12.0 - 15.0 g/dL 11.7(L) 12.4 12.0  Hematocrit 36.0 - 46.0 % 35.0(L) 37.9 36.0  Platelets 150 - 400 K/uL 237 267 288   Lab Results  Component Value Date   TSH 1.07 12/03/2016  Lipid Panel     Component Value Date/Time   CHOL  154 12/03/2016 0845   TRIG 81 12/03/2016 0845   HDL 67 12/03/2016 0845   CHOLHDL 2.3 12/03/2016 0845   VLDL 16 12/03/2016 0845   LDLCALC 71 12/03/2016 0845     RADIOLOGY: No results found.  IMPRESSION:  1. Essential hypertension   2. PSVT (paroxysmal supraventricular tachycardia) ([place])   3. Chronic venous insufficiency of lower extremity   4. Varicose  veins of lower extremity, unspecified laterality, unspecified whether complicated   5. OSA (obstructive sleep apnea)   6. Mild mitral regurgitation   7. Other idiopathic scoliosis, thoracolumbar region     ASSESSMENT AND PLAN: Ms. Sheets is an 87 year old female who has a history of documented SVT and PACs/PVCs which have been controlled with beta blocker therapy. On her echo in March 2014 she had normal systolic function with grade 1 diastolic dysfunction and had  significant left atrial dilatation and mild dilatation of the right ventricle with moderate dilatation of the right atrium. She had mild/moderate pulmonary hypertension with PA pressures of 42 mm. An echo in October 2018 showed an EF of 55 to 60% with grade 1 diastolic dysfunction, mitral annular calcification with mild MR, and mild pulmonary hypertension with PA pressure 34 mm. Her left atrium was mild to moderately dilated.  Her previous history of ectopy and palpitations have continued to be absent on her current regimen of metoprolol succinate 75 mg twice a day.  Physical exam reveals a 2/6 systolic murmur at the apex contributed by her mitral regurgitation.  She continues to have issues with her scoliosis causing rib cage and hip bone pain.  She has to sleep on her side.  She was recently evaluated by Dr. Earlie Server for elevated monocytes who felt she was stable.  Her recent CBC had shown normal absolute monocyte count at 0.9.  She has had issues with varicose veins previously had undergone lower venous reflux study October 2021 which did not reveal any evidence for deep vein  thrombosis or superficial venous thrombosis in the right lower extremity.  Venous reflux was noted in the right saphenofemoral junction, right greater saphenous vein in the thigh and right greater saphenous vein in the calf.  Her migraine headaches have been stable.  She has untreated sleep apnea but did not tolerate CPAP and also stopped using an oral appliance due to shifting of her teeth.  He believes he is sleeping adequately.   As long as she remains stable I will see her in 1 year for reevaluation or sooner as needed.    Troy Sine, MD, [facility]  02/20/2021 7:01 PM

## 2021-02-13 NOTE — Telephone Encounter (Signed)
I told pt a scheduler will call her with 3 month appt.

## 2021-02-14 ENCOUNTER — Telehealth: Payer: Self-pay | Admitting: Internal Medicine

## 2021-02-14 NOTE — Telephone Encounter (Signed)
Scheduled per los. Called and left msg. Mailed printout  °

## 2021-02-15 DIAGNOSIS — Z01419 Encounter for gynecological examination (general) (routine) without abnormal findings: Secondary | ICD-10-CM | POA: Diagnosis not present

## 2021-02-15 DIAGNOSIS — B373 Candidiasis of vulva and vagina: Secondary | ICD-10-CM | POA: Diagnosis not present

## 2021-02-15 DIAGNOSIS — Z6822 Body mass index (BMI) 22.0-22.9, adult: Secondary | ICD-10-CM | POA: Diagnosis not present

## 2021-02-20 ENCOUNTER — Other Ambulatory Visit: Payer: Self-pay

## 2021-02-20 ENCOUNTER — Ambulatory Visit: Payer: Medicare Other | Admitting: Physical Therapy

## 2021-02-20 ENCOUNTER — Encounter: Payer: Self-pay | Admitting: Cardiovascular Disease

## 2021-02-20 ENCOUNTER — Encounter: Payer: Self-pay | Admitting: Physical Therapy

## 2021-02-20 DIAGNOSIS — M4125 Other idiopathic scoliosis, thoracolumbar region: Secondary | ICD-10-CM

## 2021-02-20 DIAGNOSIS — M6281 Muscle weakness (generalized): Secondary | ICD-10-CM

## 2021-02-20 DIAGNOSIS — M62830 Muscle spasm of back: Secondary | ICD-10-CM

## 2021-02-20 DIAGNOSIS — R293 Abnormal posture: Secondary | ICD-10-CM | POA: Diagnosis not present

## 2021-02-20 DIAGNOSIS — M545 Low back pain, unspecified: Secondary | ICD-10-CM | POA: Diagnosis not present

## 2021-02-20 DIAGNOSIS — M542 Cervicalgia: Secondary | ICD-10-CM | POA: Diagnosis not present

## 2021-02-20 NOTE — Therapy (Signed)
[facility] [address], [place], [place], [address] Phone: [phone]   Fax:  [phone]  Physical Therapy Treatment  Patient Details  Name: Janet Nguyen MRN: [id number] Date of Birth: 30-Jun-1932 Referring Provider (PT): Lawerance Cruel, MD  Progress Note Reporting Period 12/26/20 to 02/20/21  See note below for Objective Data and Assessment of Progress/Goals.      Encounter Date: 02/20/2021   PT End of Session - 02/20/21 1100    Visit Number 10    Date for PT Re-Evaluation 03/22/21    Authorization Type Medicare  KX when needed    Progress Note Due on Visit 20    PT Start Time 1103    PT Stop Time 1145    PT Time Calculation (min) 42 min    Activity Tolerance Patient tolerated treatment well    Behavior During Therapy WFL for tasks assessed/performed           Past Medical History:  Diagnosis Date  . Arrhythmia    History of SVT with documented PVC'S and  PAC'S  12/08/12 Nuc stress test normal LV EF 74%  Event Monitor  12/01/12-01/03/13  . Atrial flutter ([place])   . Celiac disease    treated by Dr. [name]  . GERD (gastroesophageal reflux disease)   . Intervertebral disc stenosis of neural canal of cervical region   . Irregular heart beat 11/30/12   ECHO-EF 60-65%  . Osteoporosis   . PMR (polymyalgia rheumatica) (HCC)    Dr. Marijean Bravo; pt states she was diagnosed 10-15 years ago, not treated at this time or any issues that she is aware of.  . Scoliosis   . Scoliosis   . Sleep apnea 10/02/11 [facility]   Sleep study AHI -total sleep 10.3/hr  64.0/ hr during REM sleep.RDI 22.8/hr during total sleep 64.0/hr during REM sleep The lowest O2 sat during Non-REM and REM sleep was 86% and 88% respectively. 04/08/12 CPAP/BIPAP titration study [facility]    Past Surgical History:  Procedure Laterality Date  . APPENDECTOMY     ruptured at age 15 and had surgery  .  CARDIAC CATHETERIZATION  01/27/06  . cataract surgery  2015   Dr. Herbert Deaner; March & April 2015    There were no vitals filed for this visit.   Subjective Assessment - 02/20/21 1100    Subjective Overall my pain varies in intensity and location but the hands on work I get in PT always helps - the days I have PT and the couple of days following I have signif improvement in pain. The exercises do help and I'm back on track with getting some aspects of my HEP daily.    Pertinent History scoliosis, sleep apnea (no CPAP)    Limitations House hold activities;Lifting    Patient Stated Goals pain relief    Currently in Pain? Yes    Pain Score 5    neck pain ranges from 4-9/10   Pain Location Neck    Pain Descriptors / Indicators Aching;Sharp    Pain Type Chronic pain    Pain Radiating Towards to skull and mid and low back, Lt ribcage    Pain Onset More than a month ago    Pain Frequency Intermittent    Aggravating Factors  heavier housework    Pain Relieving Factors meds, heat, massage, HEP              [facility] PT Assessment -  02/20/21 0001      Assessment   Medical Diagnosis scoliosis, Lt rib pain, chronic spine pain    Referring Provider (PT) Lawerance Cruel, MD    Onset Date/Surgical Date --   years- chronic     Precautions   Precaution Comments signif scoliosis needs care in positioning for comfort      Observation/Other Assessments   Focus on Therapeutic Outcomes (FOTO)  61 (goal is 99)      Posture/Postural Control   Postural Limitations Rounded Shoulders;Forward head;Flexed trunk    Posture Comments significant scoliosis      AROM   Cervical Flexion 55    Cervical Extension 45    Cervical - Right Side Bend 25    Cervical - Left Side Bend 30    Cervical - Right Rotation 45    Cervical - Left Rotation 35      Strength   Cervical Flexion 4+/5    Cervical Extension 4+/5    Cervical - Right Side Bend 4+/5    Cervical - Left Side Bend 4+/5    Cervical - Right Rotation  4+/5    Cervical - Left Rotation 4+/5      Palpation   Palpation comment improving but ongoing adaptive shorteing and TPs related to scoliosis: Lt>Rt pectorals, upper traps bil, cervical paraspinals bil, levator scap bil, rhomboids, Lt ribcage compressed and tender soft tissues obliques and intercostals, Lt diaphragm                         [facility] Adult PT Treatment/Exercise - 02/20/21 0001      Neuro Re-ed    Neuro Re-ed Details  seated neck retraction to correct posture, then A/ROM within neck retraction, Rt, Lt hold 5 sec, TC/VC used within ROM for maintained retraction      Manual Therapy   Manual Therapy Soft tissue mobilization;Passive ROM    Soft tissue mobilization seated and Rt SL today: bil upper traps, levator, scalenes, pectorals, cervical paraspinals, Lt obliques, posterior GH joint, Lt QL    Passive ROM passive pec stretch in chair for thoracic and GH joint postural stretch                    PT Short Term Goals - 02/08/21 1153      PT SHORT TERM GOAL #1   Title Pt will be ind with initial HEP    Status Achieved      PT SHORT TERM GOAL #2   Title Pt will be instructed in spinal decompression series for pain relief and bone support/protection.    Baseline Pt understands what to do but some days too painful to lay on back    Status Achieved      PT SHORT TERM GOAL #3   Title Pt will report 20% reduction in Lt rib and lumbar/neck during the day.    Baseline some days, not others    Status Partially Met             PT Long Term Goals - 02/20/21 1108      PT LONG TERM GOAL #1   Title be independent in advanced HEP    Status Achieved      PT LONG TERM GOAL #2   Title report use of pain managemt strategies and position changes during daily tasks to improve posture and postural endurance    Status Achieved      PT LONG TERM GOAL #3  Title Pt will report at least 50% less neck pain with use of HEP, home modalities, postural self-checks  and actiivty modification as tools for pain control.    Baseline some days I can feel 100% better but most often at least 25% better    Status On-going                 Plan - 02/20/21 1236    Clinical Impression Statement Pt has some very good days where she feels "100% improvement" in pain management. These are days she has had PT or is within a few days of a PT appointment.  She reports at least 25% improvement in tolerance of daily activities and sleep since starting PT.  She is using tools including activity modification, pacing, postural re-checks, the Calm app, and HEP to help manage her pain and improve her posture and tissue and joint mobility.  PT noted she had regressed in her cervical rotation ROM bil this visit and reviewed neck retraction within ROM to optimize range and encouraged more daily ROM/stretching from her HEP.  Pt states pain in neck can radiate up to skull and down to mid/lower back and out to Lt ribcage but some days are better than others.  She continues to have limited mobility secondary to severe scoliosis and benefits from skilled manual therapy to address soft tissue and joint limitations.    Comorbidities fibromyalgia, scoliosis, chronic neck pain    Rehab Potential Good    PT Frequency 1x / week    PT Duration 6 weeks    PT Treatment/Interventions ADLs/Self Care Home Management;Joint Manipulations;Spinal Manipulations;Manual techniques;Neuromuscular re-education;Therapeutic exercise;Therapeutic activities;Functional mobility training;Moist Heat;Electrical Stimulation;Passive range of motion;Traction;Dry needling;Patient/family education;Cryotherapy    PT Next Visit Plan re-check cervical rotation ROM, continue manual therapy    PT Home Exercise Plan Access Code: [email or web address]- standing theraband, breathing exercises for lateral costal expansion    Consulted and Agree with Plan of Care Patient           Patient will benefit from skilled therapeutic intervention  in order to improve the following deficits and impairments:     Visit Diagnosis: Other idiopathic scoliosis, thoracolumbar region  Muscle weakness (generalized)  Abnormal posture  Muscle spasm of back  Cervicalgia     Problem List Patient Active Problem List   Diagnosis Date Noted  . Deviated septum 05/08/2020  . Mass of subcutaneous tissue 05/02/2020  . Vertigo 06/16/2019  . Pulmonary hypertension, unspecified ([place]) 04/14/2019  . Ischemic colitis ([place]) 04/14/2019  . Migraine with aura and without status migrainosus, not intractable 11/08/2018  . Degeneration of lumbar intervertebral disc 10/14/2018  . Hoarseness of voice 03/04/2018  . Abdominal pain 07/01/2017  . Diverticulitis, colon   . Metabolic acidosis, increased anion gap   . Constipation 04/14/2017  . Lumbar hernia 04/14/2017  . Presbycusis of both ears 01/10/2017  . Tinnitus aurium, bilateral 01/10/2017  . Gastroesophageal reflux disease 08/20/2016  . Hemoptysis 08/20/2016  . Obstructive sleep apnea of adult 08/20/2016  . Rhinitis, chronic 08/20/2016  . Throat pain in adult 08/20/2016  . Fatigue 12/23/2015  . Sciatica of right side 10/06/2015  . History of migraine headaches 10/06/2015  . Frequent PVCs 12/28/2013  . Premature atrial contractions 12/28/2013  . PSVT (paroxysmal supraventricular tachycardia) ([place]) 12/28/2013  . Heart palpitations 07/13/2013  . Sleep apnea 04/11/2013  . Scoliosis 04/11/2013  . Atrial flutter with rapid ventricular response ([place]) 11/29/2012  . Chest pain, atypical 11/29/2012  . Fibromyalgia syndrome 11/29/2012  .  Chronic steroid use 11/29/2012    Baruch Merl, PT 02/20/21 12:42 PM   [facility] [address], [place], [place], [address] Phone: [phone]   Fax:  [phone]  Name: Janet Nguyen MRN: [id number] Date of Birth: [birth year]

## 2021-02-21 DIAGNOSIS — Z23 Encounter for immunization: Secondary | ICD-10-CM | POA: Diagnosis not present

## 2021-02-27 ENCOUNTER — Ambulatory Visit: Payer: Medicare Other | Attending: Family Medicine | Admitting: Physical Therapy

## 2021-02-27 ENCOUNTER — Encounter: Payer: Self-pay | Admitting: Physical Therapy

## 2021-02-27 DIAGNOSIS — M542 Cervicalgia: Secondary | ICD-10-CM | POA: Insufficient documentation

## 2021-02-27 DIAGNOSIS — R293 Abnormal posture: Secondary | ICD-10-CM | POA: Diagnosis not present

## 2021-02-27 DIAGNOSIS — M4125 Other idiopathic scoliosis, thoracolumbar region: Secondary | ICD-10-CM | POA: Insufficient documentation

## 2021-02-27 DIAGNOSIS — M62830 Muscle spasm of back: Secondary | ICD-10-CM | POA: Insufficient documentation

## 2021-02-27 DIAGNOSIS — M6281 Muscle weakness (generalized): Secondary | ICD-10-CM | POA: Insufficient documentation

## 2021-02-27 NOTE — Therapy (Signed)
[facility] [address], [place], [place], [address] Phone: [phone]   Fax:  [phone]  Physical Therapy Treatment  Patient Details  Name: Janet Nguyen MRN: [id number] Date of Birth: [birth year] Referring Provider (PT): Lawerance Cruel, MD   Encounter Date: 02/27/2021   PT End of Session - 02/27/21 1247    Visit Number 11    Date for PT Re-Evaluation 03/22/21    Authorization Type Medicare  KX when needed    Progress Note Due on Visit 20    PT Start Time 1103    PT Stop Time 1144    PT Time Calculation (min) 41 min    Activity Tolerance Patient tolerated treatment well    Behavior During Therapy [facility] for tasks assessed/performed           Past Medical History:  Diagnosis Date  . Arrhythmia    History of SVT with documented PVC'S and  PAC'S  12/08/12 Nuc stress test normal LV EF 74%  Event Monitor  12/01/12-01/03/13  . Atrial flutter ([place])   . Celiac disease    treated by Dr. [name]  . GERD (gastroesophageal reflux disease)   . Intervertebral disc stenosis of neural canal of cervical region   . Irregular heart beat 11/30/12   ECHO-EF 60-65%  . Osteoporosis   . PMR (polymyalgia rheumatica) (HCC)    Dr. Marijean Bravo; pt states she was diagnosed 10-15 years ago, not treated at this time or any issues that she is aware of.  . Scoliosis   . Scoliosis   . Sleep apnea 10/02/11 [facility]   Sleep study AHI -total sleep 10.3/hr  64.0/ hr during REM sleep.RDI 22.8/hr during total sleep 64.0/hr during REM sleep The lowest O2 sat during Non-REM and REM sleep was 86% and 88% respectively. 04/08/12 CPAP/BIPAP titration study [facility]    Past Surgical History:  Procedure Laterality Date  . APPENDECTOMY     ruptured at age 87 and had surgery  . CARDIAC CATHETERIZATION  01/27/06  . cataract surgery  2015   Dr. Herbert Deaner; March & April 2015    There were no vitals filed for this  visit.   Subjective Assessment - 02/27/21 1106    Subjective The heat prevents me from being able to be outside and active. My neck pain comes and goes but I'm working on more awareness of where my head is related to my neck.  My chair that I sit in is hard for me to find good posture in.    Pertinent History scoliosis, sleep apnea (no CPAP)    Limitations House hold activities;Lifting    Patient Stated Goals pain relief    Currently in Pain? Yes    Pain Score 6     Pain Location Neck    Pain Orientation Left;Right;Upper;Mid;Lower    Pain Descriptors / Indicators Aching;Sharp    Pain Type Chronic pain    Pain Radiating Towards Lt thoracic and ribcage    Pain Onset More than a month ago    Pain Frequency Intermittent    Aggravating Factors  heavier housework, sitting in my chair at home    Pain Relieving Factors heat, massage, HEP, meds                             [facility] Adult PT Treatment/Exercise - 02/27/21 0001      Exercises  Exercises Neck      Neck Exercises: Seated   Neck Retraction 5 reps    Cervical Rotation Left;Right;5 reps    Cervical Rotation Limitations hold 5 sec    Lateral Flexion 5 reps;Left;Right    Lateral Flexion Limitations hold 5 sec      Manual Therapy   Manual Therapy Soft tissue mobilization    Soft tissue mobilization seated: bil cervical paraspinals, upper traps, levator, upper thoracic multifidi, SO, pectorals, Lt lat, intercostals on Lt                    PT Short Term Goals - 02/08/21 1153      PT SHORT TERM GOAL #1   Title Pt will be ind with initial HEP    Status Achieved      PT SHORT TERM GOAL #2   Title Pt will be instructed in spinal decompression series for pain relief and bone support/protection.    Baseline Pt understands what to do but some days too painful to lay on back    Status Achieved      PT SHORT TERM GOAL #3   Title Pt will report 20% reduction in Lt rib and lumbar/neck during the day.     Baseline some days, not others    Status Partially Met             PT Long Term Goals - 02/20/21 1108      PT LONG TERM GOAL #1   Title be independent in advanced HEP    Status Achieved      PT LONG TERM GOAL #2   Title report use of pain managemt strategies and position changes during daily tasks to improve posture and postural endurance    Status Achieved      PT LONG TERM GOAL #3   Title Pt will report at least 50% less neck pain with use of HEP, home modalities, postural self-checks and actiivty modification as tools for pain control.    Baseline some days I can feel 100% better but most often at least 25% better    Status On-going                 Plan - 02/27/21 1248    Clinical Impression Statement Pt reported ongoing good days and bad days, with good days typically following her PT appointments.  The heat has prevented her from getting out for walks as much as she'd like to. She is compliant with her HEP for ROM and postural strength.  She had 6/10 pain today in neck extending from skull to shoulders and ribcage bil.  She has trouble getting her neck comfortable in her chair due to scoliotic curve and bulk and contour of chair.  She is using tools including postional changes, activity modification, pacing, postural re-checks, the Calm app, heat, and HEP to help manage her pain and improve her posture and tissue and joint mobility.   Her cervical rotation ROM continues to be restricted but she did report she has returned to working on this more frequently.  Pt continues to benefit from skilled PT to address chronic pain related to severe scoliosis.    Comorbidities fibromyalgia, scoliosis, chronic neck pain    PT Frequency 1x / week    PT Duration 6 weeks    PT Treatment/Interventions ADLs/Self Care Home Management;Joint Manipulations;Spinal Manipulations;Manual techniques;Neuromuscular re-education;Therapeutic exercise;Therapeutic activities;Functional mobility  training;Moist Heat;Electrical Stimulation;Passive range of motion;Traction;Dry needling;Patient/family education;Cryotherapy    PT Next Visit  Plan continue manual therapy and review HEP as needed    PT Home Exercise Plan Access Code: [email or web address]- standing theraband, breathing exercises for lateral costal expansion    Consulted and Agree with Plan of Care Patient           Patient will benefit from skilled therapeutic intervention in order to improve the following deficits and impairments:     Visit Diagnosis: Other idiopathic scoliosis, thoracolumbar region  Muscle weakness (generalized)  Abnormal posture  Cervicalgia     Problem List Patient Active Problem List   Diagnosis Date Noted  . Deviated septum 05/08/2020  . Mass of subcutaneous tissue 05/02/2020  . Vertigo 06/16/2019  . Pulmonary hypertension, unspecified ([place]) 04/14/2019  . Ischemic colitis ([place]) 04/14/2019  . Migraine with aura and without status migrainosus, not intractable 11/08/2018  . Degeneration of lumbar intervertebral disc 10/14/2018  . Hoarseness of voice 03/04/2018  . Abdominal pain 07/01/2017  . Diverticulitis, colon   . Metabolic acidosis, increased anion gap   . Constipation 04/14/2017  . Lumbar hernia 04/14/2017  . Presbycusis of both ears 01/10/2017  . Tinnitus aurium, bilateral 01/10/2017  . Gastroesophageal reflux disease 08/20/2016  . Hemoptysis 08/20/2016  . Obstructive sleep apnea of adult 08/20/2016  . Rhinitis, chronic 08/20/2016  . Throat pain in adult 08/20/2016  . Fatigue 12/23/2015  . Sciatica of right side 10/06/2015  . History of migraine headaches 10/06/2015  . Frequent PVCs 12/28/2013  . Premature atrial contractions 12/28/2013  . PSVT (paroxysmal supraventricular tachycardia) ([place]) 12/28/2013  . Heart palpitations 07/13/2013  . Sleep apnea 04/11/2013  . Scoliosis 04/11/2013  . Atrial flutter with rapid ventricular response ([place]) 11/29/2012  . Chest pain, atypical  11/29/2012  . Fibromyalgia syndrome 11/29/2012  . Chronic steroid use 11/29/2012    Baruch Merl, PT 02/27/21 12:55 PM   [facility] [address] [place], [place], [address] Phone: [phone]   Fax:  [phone]  Name: Janet Nguyen MRN: [id number] Date of Birth: 1932/06/18

## 2021-03-04 ENCOUNTER — Encounter: Payer: Self-pay | Admitting: Physical Therapy

## 2021-03-04 ENCOUNTER — Ambulatory Visit: Payer: Medicare Other | Admitting: Physical Therapy

## 2021-03-04 ENCOUNTER — Other Ambulatory Visit: Payer: Self-pay

## 2021-03-04 DIAGNOSIS — M542 Cervicalgia: Secondary | ICD-10-CM | POA: Diagnosis not present

## 2021-03-04 DIAGNOSIS — M6281 Muscle weakness (generalized): Secondary | ICD-10-CM

## 2021-03-04 DIAGNOSIS — R293 Abnormal posture: Secondary | ICD-10-CM | POA: Diagnosis not present

## 2021-03-04 DIAGNOSIS — M62830 Muscle spasm of back: Secondary | ICD-10-CM

## 2021-03-04 DIAGNOSIS — M4125 Other idiopathic scoliosis, thoracolumbar region: Secondary | ICD-10-CM | POA: Diagnosis not present

## 2021-03-04 NOTE — Therapy (Addendum)
[facility] [address], [place], [place], [address] Phone: [phone]   Fax:  [phone]  Physical Therapy Treatment  Patient Details  Name: Janet Nguyen MRN: [id number] Date of Birth: Aug 11, 1932 Referring Provider (PT): Lawerance Cruel, MD   Encounter Date: 03/04/2021   PT End of Session - 03/04/21 1110     Visit Number 12    Date for PT Re-Evaluation 03/22/21    Authorization Type Medicare  KX when needed    Progress Note Due on Visit 2    PT Start Time 1105    PT Stop Time 1145    PT Time Calculation (min) 40 min    Activity Tolerance Patient tolerated treatment well    Behavior During Therapy [facility] for tasks assessed/performed             Past Medical History:  Diagnosis Date   Arrhythmia    History of SVT with documented PVC'S and  PAC'S  12/08/12 Nuc stress test normal LV EF 74%  Event Monitor  12/01/12-01/03/13   Atrial flutter ([place])    Celiac disease    treated by Dr. [name]   GERD (gastroesophageal reflux disease)    Intervertebral disc stenosis of neural canal of cervical region    Irregular heart beat 11/30/12   ECHO-EF 60-65%   Osteoporosis    PMR (polymyalgia rheumatica) (HCC)    Dr. Marijean Bravo; pt states she was diagnosed 10-15 years ago, not treated at this time or any issues that she is aware of.   Scoliosis    Scoliosis    Sleep apnea 10/02/11 [facility]   Sleep study AHI -total sleep 10.3/hr  64.0/ hr during REM sleep.RDI 22.8/hr during total sleep 64.0/hr during REM sleep The lowest O2 sat during Non-REM and REM sleep was 86% and 88% respectively. 04/08/12 CPAP/BIPAP titration study [facility]    Past Surgical History:  Procedure Laterality Date   APPENDECTOMY     ruptured at age 51 and had surgery   CARDIAC CATHETERIZATION  01/27/06   cataract surgery  2015   Dr. Herbert Deaner; March & April 2015    There were no vitals filed for this  visit.   Subjective Assessment - 03/04/21 1107     Subjective I was sick to my stomach over the weekend on Sat.  Violent throwing up.  I wasn't able to exercise that day but did on Sunday.  I am just very tired today.    Pertinent History scoliosis, sleep apnea (no CPAP)    Limitations House hold activities;Lifting    Patient Stated Goals pain relief    Currently in Pain? Yes    Pain Score 9     Pain Location Neck    Pain Orientation Left;Right;Anterior;Upper;Mid;Lower    Pain Descriptors / Indicators Aching;Sharp    Pain Type Chronic pain    Pain Onset More than a month ago    Pain Frequency Intermittent    Aggravating Factors  vomiting over the weekend, sitting in my chair at home, heavier housework    Pain Relieving Factors heat, massage, HEP, meds                               [facility] Adult PT Treatment/Exercise - 03/04/21 0001       Self-Care   Self-Care Other Self-Care Comments    Other Self-Care Comments  try to get  outside early or late in day to keep up walking and avoid the heat      Exercises   Exercises Neck;Shoulder      Modalities   Modalities Moist Heat      Moist Heat Therapy   Number Minutes Moist Heat 5 Minutes    Moist Heat Location Other (comment)   anterior chest     Manual Therapy   Manual Therapy Soft tissue mobilization    Soft tissue mobilization seated: bil cervical paraspinals, upper traps, levator, upper thoracic multifidi, SO, pectorals, manual TP release bil upper trap and levator                      PT Short Term Goals - 02/08/21 1153       PT SHORT TERM GOAL #1   Title Pt will be ind with initial HEP    Status Achieved      PT SHORT TERM GOAL #2   Title Pt will be instructed in spinal decompression series for pain relief and bone support/protection.    Baseline Pt understands what to do but some days too painful to lay on back    Status Achieved      PT SHORT TERM GOAL #3   Title Pt will report 20%  reduction in Lt rib and lumbar/neck during the day.    Baseline some days, not others    Status Partially Met               PT Long Term Goals - 02/20/21 1108       PT LONG TERM GOAL #1   Title be independent in advanced HEP    Status Achieved      PT LONG TERM GOAL #2   Title report use of pain managemt strategies and position changes during daily tasks to improve posture and postural endurance    Status Achieved      PT LONG TERM GOAL #3   Title Pt will report at least 50% less neck pain with use of HEP, home modalities, postural self-checks and actiivty modification as tools for pain control.    Baseline some days I can feel 100% better but most often at least 25% better    Status On-going                   Plan - 03/04/21 1141     Clinical Impression Statement Pt presented with 9/10 and signif TPs and tension in bil cervical and upper quadrant soft tissues including chest secondary to episodes of vomiting on Sat.  Heat and STM used with signif improvement with TP release, strumming and broadening.  PT encouraged her to continue with HEP and try to time outdoor walks in early AM or later PM to avoid heat which she struggles with.  Pt continues to use many strategies to manage chronic pain related to signif scoliosis causing compression and imbalance in trunk.  ERO next visit with D/C likely.    Comorbidities fibromyalgia, scoliosis, chronic neck pain    PT Frequency 1x / week    PT Duration 6 weeks    PT Treatment/Interventions ADLs/Self Care Home Management;Joint Manipulations;Spinal Manipulations;Manual techniques;Neuromuscular re-education;Therapeutic exercise;Therapeutic activities;Functional mobility training;Moist Heat;Electrical Stimulation;Passive range of motion;Traction;Dry needling;Patient/family education;Cryotherapy    PT Next Visit Plan continue manual therapy and review HEP as needed    PT Home Exercise Plan Access Code: [email or web address]- standing theraband,  breathing exercises for lateral costal expansion    Consulted and  Agree with Plan of Care Patient             Patient will benefit from skilled therapeutic intervention in order to improve the following deficits and impairments:     Visit Diagnosis: Other idiopathic scoliosis, thoracolumbar region  Muscle weakness (generalized)  Abnormal posture  Cervicalgia  Muscle spasm of back     Problem List Patient Active Problem List   Diagnosis Date Noted   Deviated septum 05/08/2020   Mass of subcutaneous tissue 05/02/2020   Vertigo 06/16/2019   Pulmonary hypertension, unspecified ([place]) 04/14/2019   Ischemic colitis ([place]) 04/14/2019   Migraine with aura and without status migrainosus, not intractable 11/08/2018   Degeneration of lumbar intervertebral disc 10/14/2018   Hoarseness of voice 03/04/2018   Abdominal pain 07/01/2017   Diverticulitis, colon    Metabolic acidosis, increased anion gap    Constipation 04/14/2017   Lumbar hernia 04/14/2017   Presbycusis of both ears 01/10/2017   Tinnitus aurium, bilateral 01/10/2017   Gastroesophageal reflux disease 08/20/2016   Hemoptysis 08/20/2016   Obstructive sleep apnea of adult 08/20/2016   Rhinitis, chronic 08/20/2016   Throat pain in adult 08/20/2016   Fatigue 12/23/2015   Sciatica of right side 10/06/2015   History of migraine headaches 10/06/2015   Frequent PVCs 12/28/2013   Premature atrial contractions 12/28/2013   PSVT (paroxysmal supraventricular tachycardia) (HCC) 12/28/2013   Heart palpitations 07/13/2013   Sleep apnea 04/11/2013   Scoliosis 04/11/2013   Atrial flutter with rapid ventricular response ([place]) 11/29/2012   Chest pain, atypical 11/29/2012   Fibromyalgia syndrome 11/29/2012   Chronic steroid use 11/29/2012    Marcel Gary, PT 03/04/21 11:45 AM  PHYSICAL THERAPY DISCHARGE SUMMARY  Visits from Start of Care: 12  Current functional level related to goals / functional outcomes: Pt was  sick on her final appointment day and had to cancel.  She will continue her HEP and likely return for further pain management in the future.   Remaining deficits: See above   Education / Equipment: HEP  Patient agrees to discharge. Patient goals were partially met. Patient is being discharged due to maximized rehab potential.   Baruch Merl, PT 03/20/21 11:44 AM    [facility] [address] [place], [place], [address] Phone: [phone]   Fax:  [phone]  Name: DUANA BENEDICT MRN: [id number] Date of Birth: 14-May-1932

## 2021-03-15 DIAGNOSIS — H35363 Drusen (degenerative) of macula, bilateral: Secondary | ICD-10-CM | POA: Diagnosis not present

## 2021-03-19 DIAGNOSIS — R030 Elevated blood-pressure reading, without diagnosis of hypertension: Secondary | ICD-10-CM | POA: Diagnosis not present

## 2021-03-19 DIAGNOSIS — K219 Gastro-esophageal reflux disease without esophagitis: Secondary | ICD-10-CM | POA: Diagnosis not present

## 2021-03-19 DIAGNOSIS — K589 Irritable bowel syndrome without diarrhea: Secondary | ICD-10-CM | POA: Diagnosis not present

## 2021-03-20 ENCOUNTER — Ambulatory Visit: Payer: Medicare Other | Admitting: Physical Therapy

## 2021-04-05 DIAGNOSIS — G4733 Obstructive sleep apnea (adult) (pediatric): Secondary | ICD-10-CM | POA: Diagnosis not present

## 2021-04-05 DIAGNOSIS — K219 Gastro-esophageal reflux disease without esophagitis: Secondary | ICD-10-CM | POA: Diagnosis not present

## 2021-04-13 ENCOUNTER — Other Ambulatory Visit: Payer: Self-pay | Admitting: Cardiovascular Disease

## 2021-05-14 ENCOUNTER — Inpatient Hospital Stay: Payer: Medicare Other | Attending: Internal Medicine | Admitting: Internal Medicine

## 2021-05-14 ENCOUNTER — Inpatient Hospital Stay: Payer: Medicare Other

## 2021-05-15 DIAGNOSIS — R194 Change in bowel habit: Secondary | ICD-10-CM | POA: Diagnosis not present

## 2021-05-15 DIAGNOSIS — Z86010 Personal history of colonic polyps: Secondary | ICD-10-CM | POA: Diagnosis not present

## 2021-05-15 DIAGNOSIS — R159 Full incontinence of feces: Secondary | ICD-10-CM | POA: Diagnosis not present

## 2021-05-15 DIAGNOSIS — R152 Fecal urgency: Secondary | ICD-10-CM | POA: Diagnosis not present

## 2021-05-15 DIAGNOSIS — R102 Pelvic and perineal pain: Secondary | ICD-10-CM | POA: Diagnosis not present

## 2021-05-15 DIAGNOSIS — M419 Scoliosis, unspecified: Secondary | ICD-10-CM | POA: Diagnosis not present

## 2021-05-22 DIAGNOSIS — M810 Age-related osteoporosis without current pathological fracture: Secondary | ICD-10-CM | POA: Diagnosis not present

## 2021-05-22 DIAGNOSIS — R059 Cough, unspecified: Secondary | ICD-10-CM | POA: Diagnosis not present

## 2021-05-22 DIAGNOSIS — R0789 Other chest pain: Secondary | ICD-10-CM | POA: Diagnosis not present

## 2021-05-22 DIAGNOSIS — G4733 Obstructive sleep apnea (adult) (pediatric): Secondary | ICD-10-CM | POA: Diagnosis not present

## 2021-05-22 DIAGNOSIS — Z8739 Personal history of other diseases of the musculoskeletal system and connective tissue: Secondary | ICD-10-CM | POA: Diagnosis not present

## 2021-05-22 DIAGNOSIS — R0989 Other specified symptoms and signs involving the circulatory and respiratory systems: Secondary | ICD-10-CM | POA: Diagnosis not present

## 2021-05-22 DIAGNOSIS — G9001 Carotid sinus syncope: Secondary | ICD-10-CM | POA: Diagnosis not present

## 2021-05-22 DIAGNOSIS — I10 Essential (primary) hypertension: Secondary | ICD-10-CM | POA: Diagnosis not present

## 2021-05-22 DIAGNOSIS — K1379 Other lesions of oral mucosa: Secondary | ICD-10-CM | POA: Diagnosis not present

## 2021-05-22 DIAGNOSIS — K219 Gastro-esophageal reflux disease without esophagitis: Secondary | ICD-10-CM | POA: Diagnosis not present

## 2021-05-22 DIAGNOSIS — M419 Scoliosis, unspecified: Secondary | ICD-10-CM | POA: Diagnosis not present

## 2021-05-22 DIAGNOSIS — R233 Spontaneous ecchymoses: Secondary | ICD-10-CM | POA: Diagnosis not present

## 2021-05-23 DIAGNOSIS — K1379 Other lesions of oral mucosa: Secondary | ICD-10-CM | POA: Diagnosis not present

## 2021-05-23 DIAGNOSIS — K219 Gastro-esophageal reflux disease without esophagitis: Secondary | ICD-10-CM | POA: Diagnosis not present

## 2021-05-23 DIAGNOSIS — M419 Scoliosis, unspecified: Secondary | ICD-10-CM | POA: Diagnosis not present

## 2021-05-23 DIAGNOSIS — G4733 Obstructive sleep apnea (adult) (pediatric): Secondary | ICD-10-CM | POA: Diagnosis not present

## 2021-05-23 DIAGNOSIS — H938X9 Other specified disorders of ear, unspecified ear: Secondary | ICD-10-CM | POA: Diagnosis not present

## 2021-05-23 DIAGNOSIS — Z8739 Personal history of other diseases of the musculoskeletal system and connective tissue: Secondary | ICD-10-CM | POA: Diagnosis not present

## 2021-05-23 DIAGNOSIS — R0789 Other chest pain: Secondary | ICD-10-CM | POA: Diagnosis not present

## 2021-05-23 DIAGNOSIS — R233 Spontaneous ecchymoses: Secondary | ICD-10-CM | POA: Diagnosis not present

## 2021-05-23 DIAGNOSIS — I10 Essential (primary) hypertension: Secondary | ICD-10-CM | POA: Diagnosis not present

## 2021-05-23 DIAGNOSIS — Z0000 Encounter for general adult medical examination without abnormal findings: Secondary | ICD-10-CM | POA: Diagnosis not present

## 2021-05-23 DIAGNOSIS — G9001 Carotid sinus syncope: Secondary | ICD-10-CM | POA: Diagnosis not present

## 2021-05-23 DIAGNOSIS — R059 Cough, unspecified: Secondary | ICD-10-CM | POA: Diagnosis not present

## 2021-05-29 DIAGNOSIS — Z23 Encounter for immunization: Secondary | ICD-10-CM | POA: Diagnosis not present

## 2021-05-29 DIAGNOSIS — I10 Essential (primary) hypertension: Secondary | ICD-10-CM | POA: Diagnosis not present

## 2021-05-29 DIAGNOSIS — Z8739 Personal history of other diseases of the musculoskeletal system and connective tissue: Secondary | ICD-10-CM | POA: Diagnosis not present

## 2021-05-29 DIAGNOSIS — M419 Scoliosis, unspecified: Secondary | ICD-10-CM | POA: Diagnosis not present

## 2021-05-29 DIAGNOSIS — M810 Age-related osteoporosis without current pathological fracture: Secondary | ICD-10-CM | POA: Diagnosis not present

## 2021-05-29 DIAGNOSIS — Z0000 Encounter for general adult medical examination without abnormal findings: Secondary | ICD-10-CM | POA: Diagnosis not present

## 2021-05-29 DIAGNOSIS — G479 Sleep disorder, unspecified: Secondary | ICD-10-CM | POA: Diagnosis not present

## 2021-05-29 DIAGNOSIS — K219 Gastro-esophageal reflux disease without esophagitis: Secondary | ICD-10-CM | POA: Diagnosis not present

## 2021-05-29 DIAGNOSIS — F320 Major depressive disorder, single episode, mild: Secondary | ICD-10-CM | POA: Diagnosis not present

## 2021-05-29 DIAGNOSIS — K589 Irritable bowel syndrome without diarrhea: Secondary | ICD-10-CM | POA: Diagnosis not present

## 2021-05-29 DIAGNOSIS — D72821 Monocytosis (symptomatic): Secondary | ICD-10-CM | POA: Diagnosis not present

## 2021-05-29 DIAGNOSIS — F419 Anxiety disorder, unspecified: Secondary | ICD-10-CM | POA: Diagnosis not present

## 2021-05-30 DIAGNOSIS — G245 Blepharospasm: Secondary | ICD-10-CM | POA: Diagnosis not present

## 2021-05-30 DIAGNOSIS — H538 Other visual disturbances: Secondary | ICD-10-CM | POA: Diagnosis not present

## 2021-05-30 DIAGNOSIS — H353132 Nonexudative age-related macular degeneration, bilateral, intermediate dry stage: Secondary | ICD-10-CM | POA: Diagnosis not present

## 2021-05-30 DIAGNOSIS — H16142 Punctate keratitis, left eye: Secondary | ICD-10-CM | POA: Diagnosis not present

## 2021-06-11 DIAGNOSIS — N94819 Vulvodynia, unspecified: Secondary | ICD-10-CM | POA: Diagnosis not present

## 2021-06-13 DIAGNOSIS — Z1211 Encounter for screening for malignant neoplasm of colon: Secondary | ICD-10-CM | POA: Diagnosis not present

## 2021-06-20 DIAGNOSIS — Z23 Encounter for immunization: Secondary | ICD-10-CM | POA: Diagnosis not present

## 2021-06-25 DIAGNOSIS — M419 Scoliosis, unspecified: Secondary | ICD-10-CM | POA: Diagnosis not present

## 2021-06-25 DIAGNOSIS — M47897 Other spondylosis, lumbosacral region: Secondary | ICD-10-CM | POA: Diagnosis not present

## 2021-06-26 ENCOUNTER — Other Ambulatory Visit: Payer: Self-pay

## 2021-06-26 ENCOUNTER — Ambulatory Visit: Payer: Medicare Other | Attending: Family Medicine | Admitting: Physical Therapy

## 2021-06-26 ENCOUNTER — Encounter: Payer: Self-pay | Admitting: Physical Therapy

## 2021-06-26 DIAGNOSIS — M6281 Muscle weakness (generalized): Secondary | ICD-10-CM | POA: Diagnosis not present

## 2021-06-26 DIAGNOSIS — R293 Abnormal posture: Secondary | ICD-10-CM | POA: Insufficient documentation

## 2021-06-26 DIAGNOSIS — M4125 Other idiopathic scoliosis, thoracolumbar region: Secondary | ICD-10-CM | POA: Diagnosis not present

## 2021-06-26 NOTE — Therapy (Signed)
[facility] [address], [place], [place], [address] Phone: [phone]   Fax:  [phone]  Physical Therapy Evaluation  Patient Details  Name: Janet Nguyen MRN: [id number] Date of Birth: 30-Jan-1932 Referring Provider (PT): Lawerance Cruel, MD   Encounter Date: 06/26/2021   PT End of Session - 06/26/21 1315     Visit Number 1    Date for PT Re-Evaluation 08/07/21    Authorization Type medicare    Progress Note Due on Visit 10    PT Start Time 5537    PT Stop Time 1230    PT Time Calculation (min) 45 min    Activity Tolerance Patient tolerated treatment well    Behavior During Therapy [facility] for tasks assessed/performed             Past Medical History:  Diagnosis Date   Arrhythmia    History of SVT with documented PVC'S and  PAC'S  12/08/12 Nuc stress test normal LV EF 74%  Event Monitor  12/01/12-01/03/13   Atrial flutter ([place])    Celiac disease    treated by Dr. [name]   GERD (gastroesophageal reflux disease)    Intervertebral disc stenosis of neural canal of cervical region    Irregular heart beat 11/30/12   ECHO-EF 60-65%   Osteoporosis    PMR (polymyalgia rheumatica) (HCC)    Dr. Marijean Bravo; pt states she was diagnosed 10-15 years ago, not treated at this time or any issues that she is aware of.   Scoliosis    Scoliosis    Sleep apnea 10/02/11 [facility]   Sleep study AHI -total sleep 10.3/hr  64.0/ hr during REM sleep.RDI 22.8/hr during total sleep 64.0/hr during REM sleep The lowest O2 sat during Non-REM and REM sleep was 86% and 88% respectively. 04/08/12 CPAP/BIPAP titration study [facility]    Past Surgical History:  Procedure Laterality Date   APPENDECTOMY     ruptured at age 30 and had surgery   CARDIAC CATHETERIZATION  01/27/06   cataract surgery  2015   Dr. Herbert Deaner; March & April 2015    There were no vitals filed for this visit.     Subjective Assessment - 06/26/21 1153     Subjective Pt is a familiar patient to this clinic returning for pain related to scoliosis.  Lt side of trunk is very painful and tight.  It catches as I get into bed at night.  I also feel like I'm "6 mos pregnant".  I have gotten away from doing my HEP and am always better about doing them when I am coming to PT.  I am still using Calm App and heat for relief.    Pertinent History scoliosis    Limitations Lifting;House hold activities;Sitting    How long can you sit comfortably? 5-10 min    How long can you stand comfortably? standing is better than sitting    Patient Stated Goals help relax the muscles, learn more to dos and not to dos to guide activity    Currently in Pain? Yes    Pain Score 7    can reach 9/10   Pain Location Back    Pain Orientation Posterior;Lateral;Anterior;Left    Pain Descriptors / Indicators Tightness;Aching;Sore;Spasm    Pain Type Chronic pain    Pain Onset More than a month ago    Pain Frequency Constant    Aggravating Factors  sitting    Pain  Relieving Factors calm app, heat, standing vs sitting    Effect of Pain on Daily Activities has someone who comes to help with vacuuming                [facility] PT Assessment - 06/26/21 0001       Assessment   Medical Diagnosis M41.9 (ICD-10-CM) - Scoliosis, unspecified    Referring Provider (PT) Lawerance Cruel, MD    Onset Date/Surgical Date --   many years   Hand Dominance Right    Next MD Visit as needed    Prior Therapy yes at this facility      [place] Access Level entry    [place] One level      Prior Function   Level of Independence Independent    Vocation Retired    Leisure walk for exercise, read, knit      Cognition   Overall Cognitive Status Within Functional Limits for tasks assessed      Observation/Other Assessments   Scoliosis  signif scoliosis present with Lt thoracolumbar compression and kyphosis    Focus on Therapeutic Outcomes (FOTO)  will do ODI next visit      Posture/Postural Control   Posture/Postural Control Postural limitations    Postural Limitations Rounded Shoulders;Forward head;Increased thoracic kyphosis;Left pelvic obliquity      ROM / Strength   AROM / PROM / Strength AROM;Strength;PROM      AROM   Overall AROM Comments trunk ROM limited 50-75% for flexion, ext and SB secondary to rigid scoliosis, thoracic rotation [facility]    AROM Assessment Site Cervical    Cervical Flexion 55    Cervical Extension 40    Cervical - Right Side Bend 18    Cervical - Left Side Bend 20    Cervical - Right Rotation 35    Cervical - Left Rotation 30      PROM   Overall PROM Comments limited hip ER Lt>Rt      Strength   Overall Strength Comments UEs 4/5, LEs 4/5      Flexibility   Soft Tissue Assessment /Muscle Length yes    Hamstrings limited 20% bil    Piriformis limited on Lt 50%, on Rt 25%    Quadratus Lumborum limited on Lt 75%      Palpation   Palpation comment diffuse tenderness reported bil upper traps, Rt intrascapular region, Lt QL and obliques      Transfers   Transfers Independent with all Transfers      Ambulation/Gait   Gait Pattern Within Functional Limits                        Objective measurements completed on examination: See above findings.       [place] Adult PT Treatment/Exercise - 06/26/21 0001       Exercises   Exercises Lumbar      Lumbar Exercises: Stretches   Active Hamstring Stretch Left;Right;1 rep;30 seconds    Active Hamstring Stretch Limitations seated    Figure 4 Stretch 1 rep;30 seconds;Supine;With overpressure    Other Lumbar Stretch Exercise QL doorway stretch for Lt QL x 20 sec      Manual Therapy   Manual Therapy Soft tissue mobilization    Soft tissue mobilization Lt QL and obliques for elgonation in Rt SL  PT Education - 06/26/21 1230     Education Details Access Code: [email or web address]    Person(s) Educated Patient    Methods Explanation;Handout;Demonstration    Comprehension Verbalized understanding;Returned demonstration              PT Short Term Goals - 06/26/21 1323       PT SHORT TERM GOAL #1   Title Pt will be ind and compliant with initial HEP at least 3x/week.    Time 3    Period Weeks    Status New    Target Date 07/17/21               PT Long Term Goals - 06/26/21 1324       PT LONG TERM GOAL #1   Title Pt will improve LE flexbility to [facility] to reduce undue strain on back.    Baseline -    Time 6    Period Weeks    Status New    Target Date 08/07/21      PT LONG TERM GOAL #2   Title Pt will improve scapular and UE strength to at least 4+/5 for improved postural support and functional use of bil UEs    Time 6    Period Weeks    Status New    Target Date 08/07/21      PT LONG TERM GOAL #3   Title Pt will report compliance with HEP at least 4 times a week to improve strength and mobility    Baseline -    Time 6    Period Weeks    Status New    Target Date 08/07/21                    Plan - 06/26/21 1316     Clinical Impression Statement Pt is a returning Pt familiar to this clinic with long history of pain related to scoliosis.  Curvature creates signif compression along Lt trunk and weakness along Rt thoracic kyphosis. Pt reports constant pain that varies in intensity.  Symptoms are better in standing and with walking than in sitting due to compression and pain in sitting.  Pt walks in her neighborhood several times daily for exericse but admits she has gotten away from HEP from last round of PT.  She has limited trunk ROM, weakness of bil UEs and trunk >LEs, and diffuse tenderness of trunk musculature with signif soft tissue shortening of Lt QL and obliques secondary to scoliosis.  Pt will benefit from revamping of her HEP  coupled with manual therapy to address her pain and mobility restrictions.    Personal Factors and Comorbidities Age;Time since onset of injury/illness/exacerbation    Examination-Activity Limitations Bend;Sit;Sleep;Lift;Carry;Squat    Examination-Participation Restrictions Estate agent;Shop;Meal Prep    Stability/Clinical Decision Making Stable/Uncomplicated    Clinical Decision Making Low    Rehab Potential Good    PT Frequency 2x / week    PT Duration 6 weeks    PT Treatment/Interventions ADLs/Self Care Home Management;Moist Heat;Functional mobility training;Therapeutic exercise;Therapeutic activities;Patient/family education;Manual techniques;Passive range of motion;Energy conservation    PT Next Visit Plan review initial HEP, add neck ROM in retraction, row, ER and ext for UEs, manual therapy for Lt trunk elongation    PT Home Exercise Plan Access Code: [email or web address]    Consulted and Agree with Plan of Care Patient             Patient will benefit from skilled therapeutic intervention in order to  improve the following deficits and impairments:  Decreased range of motion, Increased fascial restricitons, Increased muscle spasms, Decreased activity tolerance, Pain, Hypomobility, Impaired flexibility, Improper body mechanics, Postural dysfunction, Decreased strength, Decreased mobility  Visit Diagnosis: Other idiopathic scoliosis, thoracolumbar region - Plan: PT plan of care cert/re-cert  Muscle weakness (generalized) - Plan: PT plan of care cert/re-cert  Abnormal posture - Plan: PT plan of care cert/re-cert     Problem List Patient Active Problem List   Diagnosis Date Noted   Deviated septum 05/08/2020   Mass of subcutaneous tissue 05/02/2020   Vertigo 06/16/2019   Pulmonary hypertension, unspecified ([place]) 04/14/2019   Ischemic colitis ([place]) 04/14/2019   Migraine with aura and without status migrainosus, not intractable 11/08/2018   Degeneration of lumbar  intervertebral disc 10/14/2018   Hoarseness of voice 03/04/2018   Abdominal pain 07/01/2017   Diverticulitis, colon    Metabolic acidosis, increased anion gap    Constipation 04/14/2017   Lumbar hernia 04/14/2017   Presbycusis of both ears 01/10/2017   Tinnitus aurium, bilateral 01/10/2017   Gastroesophageal reflux disease 08/20/2016   Hemoptysis 08/20/2016   Obstructive sleep apnea of adult 08/20/2016   Rhinitis, chronic 08/20/2016   Throat pain in adult 08/20/2016   Fatigue 12/23/2015   Sciatica of right side 10/06/2015   History of migraine headaches 10/06/2015   Frequent PVCs 12/28/2013   Premature atrial contractions 12/28/2013   PSVT (paroxysmal supraventricular tachycardia) ([place]) 12/28/2013   Heart palpitations 07/13/2013   Sleep apnea 04/11/2013   Scoliosis 04/11/2013   Atrial flutter with rapid ventricular response ([place]) 11/29/2012   Chest pain, atypical 11/29/2012   Fibromyalgia syndrome 11/29/2012   Chronic steroid use 11/29/2012    Carrson Lightcap, PT 06/26/21 1:37 PM   [facility] [address] [place], [place], [address] Phone: [phone]   Fax:  [phone]  Name: Janet Nguyen MRN: [id number] Date of Birth: November 27, 1931

## 2021-06-26 NOTE — Patient Instructions (Signed)
Access Code: [email or web address] URL: [email or web address] Date: 06/26/2021 Prepared by: Venetia Night Deziyah Arvin  Exercises Seated Hamstring Stretch - 2 x daily - 7 x weekly - 1 sets - 2 reps - 20 hold Supine Figure 4 Piriformis Stretch - 2 x daily - 7 x weekly - 1 sets - 2 reps - 20 hold Standing Quadratus Lumborum Stretch with Doorway - 2 x daily - 7 x weekly - 1 sets - 2 reps - 20 hold

## 2021-07-01 DIAGNOSIS — Z23 Encounter for immunization: Secondary | ICD-10-CM | POA: Diagnosis not present

## 2021-07-03 ENCOUNTER — Ambulatory Visit: Payer: Medicare Other | Attending: Family Medicine | Admitting: Physical Therapy

## 2021-07-03 ENCOUNTER — Other Ambulatory Visit: Payer: Self-pay

## 2021-07-03 ENCOUNTER — Encounter: Payer: Self-pay | Admitting: Physical Therapy

## 2021-07-03 DIAGNOSIS — M6281 Muscle weakness (generalized): Secondary | ICD-10-CM | POA: Insufficient documentation

## 2021-07-03 DIAGNOSIS — M4125 Other idiopathic scoliosis, thoracolumbar region: Secondary | ICD-10-CM | POA: Insufficient documentation

## 2021-07-03 DIAGNOSIS — M542 Cervicalgia: Secondary | ICD-10-CM

## 2021-07-03 DIAGNOSIS — R293 Abnormal posture: Secondary | ICD-10-CM | POA: Diagnosis not present

## 2021-07-03 NOTE — Therapy (Signed)
[facility] @ [place], [place], [address] Phone:     Fax:     Physical Therapy Treatment  Patient Details  Name: Janet Nguyen MRN: [id number] Date of Birth: 23-Aug-1932 Referring Provider (PT): Lawerance Cruel, MD   Encounter Date: 07/03/2021   PT End of Session - 07/03/21 1022     Visit Number 2    Date for PT Re-Evaluation 08/07/21    Authorization Type medicare    Progress Note Due on Visit 10    PT Start Time 0109    PT Stop Time 1058    PT Time Calculation (min) 43 min    Activity Tolerance Patient tolerated treatment well    Behavior During Therapy [facility] for tasks assessed/performed             Past Medical History:  Diagnosis Date   Arrhythmia    History of SVT with documented PVC'S and  PAC'S  12/08/12 Nuc stress test normal LV EF 74%  Event Monitor  12/01/12-01/03/13   Atrial flutter ([place])    Celiac disease    treated by Dr. [name]   GERD (gastroesophageal reflux disease)    Intervertebral disc stenosis of neural canal of cervical region    Irregular heart beat 11/30/12   ECHO-EF 60-65%   Osteoporosis    PMR (polymyalgia rheumatica) (HCC)    Dr. Marijean Bravo; pt states she was diagnosed 10-15 years ago, not treated at this time or any issues that she is aware of.   Scoliosis    Scoliosis    Sleep apnea 10/02/11 [facility]   Sleep study AHI -total sleep 10.3/hr  64.0/ hr during REM sleep.RDI 22.8/hr during total sleep 64.0/hr during REM sleep The lowest O2 sat during Non-REM and REM sleep was 86% and 88% respectively. 04/08/12 CPAP/BIPAP titration study [facility]    Past Surgical History:  Procedure Laterality Date   APPENDECTOMY     ruptured at age 6 and had surgery   CARDIAC CATHETERIZATION  01/27/06   cataract surgery  2015   Dr. Herbert Deaner; March & April 2015    There were no vitals filed for this visit.   Subjective Assessment - 07/03/21  1014     Subjective I've had a lot of aches and pains.  I did the exercises you gave me most days.    Pertinent History scoliosis    Limitations Lifting;House hold activities;Sitting    How long can you sit comfortably? 5-10 min    How long can you stand comfortably? standing is better than sitting    Patient Stated Goals help relax the muscles, learn more to dos and not to dos to guide activity    Currently in Pain? Yes    Pain Score 4     Pain Location Back    Pain Onset More than a month ago    Pain Frequency Constant    Aggravating Factors  sitting    Pain Relieving Factors calm app, heat, standing vs sitting                               [facility] Adult PT Treatment/Exercise - 07/03/21 0001       Exercises   Exercises Lumbar;Shoulder      Lumbar Exercises: Stretches   Figure 4 Stretch 1 rep;30 seconds;With overpressure    Figure 4 Stretch Limitations  supine      Lumbar Exercises: Aerobic   Nustep L2 seat 8 arms 10 x 5' PT present to discuss pain and status      Lumbar Exercises: Standing   Heel Raises 20 reps    Heel Raises Limitations bil UE support      Lumbar Exercises: Supine   Bridge 10 reps      Shoulder Exercises: Supine   Horizontal ABduction Strengthening;Both;Theraband    Theraband Level (Shoulder Horizontal ABduction) Level 1 (Yellow)    External Rotation Strengthening;Both;20 reps;Theraband    Theraband Level (Shoulder External Rotation) Level 1 (Yellow)      Shoulder Exercises: Standing   Row Strengthening;20 reps;Theraband    Theraband Level (Shoulder Row) Level 1 (Yellow)      Manual Therapy   Manual Therapy Soft tissue mobilization    Manual therapy comments seated in chair    Soft tissue mobilization bil upper quadrants: upper traps, cervical paraspinals, pectorals for elongation, intrascapular, levator                       PT Short Term Goals - 06/26/21 1323       PT SHORT TERM GOAL #1   Title Pt will be  ind and compliant with initial HEP at least 3x/week.    Time 3    Period Weeks    Status New    Target Date 07/17/21               PT Long Term Goals - 06/26/21 1324       PT LONG TERM GOAL #1   Title Pt will improve LE flexbility to [facility] to reduce undue strain on back.    Baseline -    Time 6    Period Weeks    Status New    Target Date 08/07/21      PT LONG TERM GOAL #2   Title Pt will improve scapular and UE strength to at least 4+/5 for improved postural support and functional use of bil UEs    Time 6    Period Weeks    Status New    Target Date 08/07/21      PT LONG TERM GOAL #3   Title Pt will report compliance with HEP at least 4 times a week to improve strength and mobility    Baseline -    Time 6    Period Weeks    Status New    Target Date 08/07/21                   Plan - 07/03/21 1100     Clinical Impression Statement Pt arrived with reduced pain level from eval.  She has been compliant with intial HEP and requested to do some mat based work since she can't get onto floor or do ther ex from bed very well.  She was able to tolerate postural strength and NuStep today without exacerbation of pain.  STM performed to bil upper quadrants due to bil neck pain and tightness in soft tissues this week.  Progress HEP next visit to reflect ther ex from today.    Rehab Potential Good    PT Frequency 2x / week    PT Duration 6 weeks    PT Next Visit Plan update HEP from today's ther ex if well tolerated, NuStep, mat based stretching and strength, STM Lt trunk and bil upper quadrants    PT Home Exercise Plan Access  Code: [email or web address]    Consulted and Agree with Plan of Care Patient             Patient will benefit from skilled therapeutic intervention in order to improve the following deficits and impairments:     Visit Diagnosis: Other idiopathic scoliosis, thoracolumbar region  Muscle weakness (generalized)  Abnormal  posture  Cervicalgia     Problem List Patient Active Problem List   Diagnosis Date Noted   Deviated septum 05/08/2020   Mass of subcutaneous tissue 05/02/2020   Vertigo 06/16/2019   Pulmonary hypertension, unspecified ([place]) 04/14/2019   Ischemic colitis ([place]) 04/14/2019   Migraine with aura and without status migrainosus, not intractable 11/08/2018   Degeneration of lumbar intervertebral disc 10/14/2018   Hoarseness of voice 03/04/2018   Abdominal pain 07/01/2017   Diverticulitis, colon    Metabolic acidosis, increased anion gap    Constipation 04/14/2017   Lumbar hernia 04/14/2017   Presbycusis of both ears 01/10/2017   Tinnitus aurium, bilateral 01/10/2017   Gastroesophageal reflux disease 08/20/2016   Hemoptysis 08/20/2016   Obstructive sleep apnea of adult 08/20/2016   Rhinitis, chronic 08/20/2016   Throat pain in adult 08/20/2016   Fatigue 12/23/2015   Sciatica of right side 10/06/2015   History of migraine headaches 10/06/2015   Frequent PVCs 12/28/2013   Premature atrial contractions 12/28/2013   PSVT (paroxysmal supraventricular tachycardia) ([place]) 12/28/2013   Heart palpitations 07/13/2013   Sleep apnea 04/11/2013   Scoliosis 04/11/2013   Atrial flutter with rapid ventricular response ([place]) 11/29/2012   Chest pain, atypical 11/29/2012   Fibromyalgia syndrome 11/29/2012   Chronic steroid use 11/29/2012    Baruch Merl, PT 07/03/21 11:05 AM   [facility] @ [place], [place], [address] Phone:     Fax:     Name: Janet Nguyen MRN: [id number] Date of Birth: 29-Dec-1931

## 2021-07-09 ENCOUNTER — Ambulatory Visit: Payer: Medicare Other

## 2021-07-09 DIAGNOSIS — N760 Acute vaginitis: Secondary | ICD-10-CM | POA: Diagnosis not present

## 2021-07-11 ENCOUNTER — Other Ambulatory Visit: Payer: Self-pay

## 2021-07-11 ENCOUNTER — Ambulatory Visit: Payer: Medicare Other | Admitting: Physical Therapy

## 2021-07-11 ENCOUNTER — Encounter: Payer: Self-pay | Admitting: Physical Therapy

## 2021-07-11 DIAGNOSIS — M542 Cervicalgia: Secondary | ICD-10-CM

## 2021-07-11 DIAGNOSIS — M4125 Other idiopathic scoliosis, thoracolumbar region: Secondary | ICD-10-CM | POA: Diagnosis not present

## 2021-07-11 DIAGNOSIS — G8929 Other chronic pain: Secondary | ICD-10-CM

## 2021-07-11 DIAGNOSIS — R293 Abnormal posture: Secondary | ICD-10-CM

## 2021-07-11 DIAGNOSIS — M6281 Muscle weakness (generalized): Secondary | ICD-10-CM

## 2021-07-11 DIAGNOSIS — M62830 Muscle spasm of back: Secondary | ICD-10-CM

## 2021-07-11 NOTE — Therapy (Signed)
[facility] @ [place], [place], [address] Phone: [phone]   Fax:  [phone]  Physical Therapy Treatment  Patient Details  Name: Janet Nguyen MRN: [id number] Date of Birth: 06-01-1932 Referring Provider (PT): Lawerance Cruel, MD   Encounter Date: 07/11/2021   PT End of Session - 07/11/21 1401     Visit Number 3    Date for PT Re-Evaluation 08/07/21    Authorization Type medicare    Progress Note Due on Visit 10    PT Start Time 1401    PT Stop Time 1445    PT Time Calculation (min) 44 min    Activity Tolerance Patient tolerated treatment well    Behavior During Therapy [facility] for tasks assessed/performed             Past Medical History:  Diagnosis Date   Arrhythmia    History of SVT with documented PVC'S and  PAC'S  12/08/12 Nuc stress test normal LV EF 74%  Event Monitor  12/01/12-01/03/13   Atrial flutter ([place])    Celiac disease    treated by Dr. [name]   GERD (gastroesophageal reflux disease)    Intervertebral disc stenosis of neural canal of cervical region    Irregular heart beat 11/30/12   ECHO-EF 60-65%   Osteoporosis    PMR (polymyalgia rheumatica) (HCC)    Dr. Marijean Bravo; pt states she was diagnosed 10-15 years ago, not treated at this time or any issues that she is aware of.   Scoliosis    Scoliosis    Sleep apnea 10/02/11 [facility]   Sleep study AHI -total sleep 10.3/hr  64.0/ hr during REM sleep.RDI 22.8/hr during total sleep 64.0/hr during REM sleep The lowest O2 sat during Non-REM and REM sleep was 86% and 88% respectively. 04/08/12 CPAP/BIPAP titration study [facility]    Past Surgical History:  Procedure Laterality Date   APPENDECTOMY     ruptured at age 20 and had surgery   CARDIAC CATHETERIZATION  01/27/06   cataract surgery  2015   Dr. Herbert Deaner; March & April 2015    There were no vitals filed for this visit.   Subjective  Assessment - 07/11/21 1420     Subjective A migraine just came on about 10 min ago after yardwork.  I can't lay on my back and don't know how much exercise I can do.  I have Rt hip and groin pain.   How long can you walk comfortably? limping while walking    Pain Type Acute pain    Pain Radiating Towards Left hip    Pain Onset In the past 7 days    Pain Frequency Intermittent                               [facility] Adult PT Treatment/Exercise - 07/11/21 0001       Exercises   Exercises Shoulder;Lumbar;Neck      Neck Exercises: Machines for Strengthening   Nustep seat 8, level 2, 2'   discontinued due to neck pain     Lumbar Exercises: Stretches   Hip Flexor Stretch Right;2 reps;10 seconds    Hip Flexor Stretch Limitations standing with Rt UE elevation      Manual Therapy   Manual Therapy Soft tissue mobilization    Manual therapy comments seated in chair for neck and in right sidelying  for right low back   ql, lumbar paraspinals, glut max and glut med   Soft tissue mobilization bil upper quadrants: upper traps, cervical paraspinals, pectorals for elongation, intrascapular, levator                       PT Short Term Goals - 06/26/21 1323       PT SHORT TERM GOAL #1   Title Pt will be ind and compliant with initial HEP at least 3x/week.    Time 3    Period Weeks    Status New    Target Date 07/17/21               PT Long Term Goals - 06/26/21 1324       PT LONG TERM GOAL #1   Title Pt will improve LE flexbility to [facility] to reduce undue strain on back.    Baseline -    Time 6    Period Weeks    Status New    Target Date 08/07/21      PT LONG TERM GOAL #2   Title Pt will improve scapular and UE strength to at least 4+/5 for improved postural support and functional use of bil UEs    Time 6    Period Weeks    Status New    Target Date 08/07/21      PT LONG TERM GOAL #3   Title Pt will report compliance with HEP at least 4 times  a week to improve strength and mobility    Baseline -    Time 6    Period Weeks    Status New    Target Date 08/07/21                   Plan - 07/11/21 1429     Clinical Impression Statement Patient arrived with migraine onset 10 min prior to appt.  She mentions Rt hip and groin pain today, not mentioned previously but not new to her.  She has been compliant with HEP.  She attempted NuStep but d/c'd after 2 min due to increasing neck pain and declined attempting other ther ex.  STM performed to bil upper quadrants due to bil neck pain and tightness in soft tissues. She has difficulty laying on Lt side so PT performed STM to Rt lumbar and hip in Rt SL today.  Pt with report of "100%" improvement in migraine symptoms and signif relief of neck pain end of session.  Continue along POC with return to ther ex next visit if Pt feeling better.    PT Frequency 2x / week    PT Duration 6 weeks    PT Treatment/Interventions ADLs/Self Care Home Management;Moist Heat;Functional mobility training;Therapeutic exercise;Therapeutic activities;Patient/family education;Manual techniques;Passive range of motion;Energy conservation    PT Next Visit Plan Pt had migraine last visit, attempt update HEP from 2nd visit ther ex, NuStep, mat based stretching and strength, STM Lt trunk and bil upper quadrants    PT Home Exercise Plan Access Code: [email or web address]    Consulted and Agree with Plan of Care Patient             Patient will benefit from skilled therapeutic intervention in order to improve the following deficits and impairments:     Visit Diagnosis: Other idiopathic scoliosis, thoracolumbar region  Muscle weakness (generalized)  Abnormal posture  Cervicalgia  Muscle spasm of back  Chronic bilateral low back pain without sciatica  Problem List Patient Active Problem List   Diagnosis Date Noted   Deviated septum 05/08/2020   Mass of subcutaneous tissue 05/02/2020   Vertigo 06/16/2019    Pulmonary hypertension, unspecified ([place]) 04/14/2019   Ischemic colitis ([place]) 04/14/2019   Migraine with aura and without status migrainosus, not intractable 11/08/2018   Degeneration of lumbar intervertebral disc 10/14/2018   Hoarseness of voice 03/04/2018   Abdominal pain 07/01/2017   Diverticulitis, colon    Metabolic acidosis, increased anion gap    Constipation 04/14/2017   Lumbar hernia 04/14/2017   Presbycusis of both ears 01/10/2017   Tinnitus aurium, bilateral 01/10/2017   Gastroesophageal reflux disease 08/20/2016   Hemoptysis 08/20/2016   Obstructive sleep apnea of adult 08/20/2016   Rhinitis, chronic 08/20/2016   Throat pain in adult 08/20/2016   Fatigue 12/23/2015   Sciatica of right side 10/06/2015   History of migraine headaches 10/06/2015   Frequent PVCs 12/28/2013   Premature atrial contractions 12/28/2013   PSVT (paroxysmal supraventricular tachycardia) ([place]) 12/28/2013   Heart palpitations 07/13/2013   Sleep apnea 04/11/2013   Scoliosis 04/11/2013   Atrial flutter with rapid ventricular response ([place]) 11/29/2012   Chest pain, atypical 11/29/2012   Fibromyalgia syndrome 11/29/2012   Chronic steroid use 11/29/2012    Baruch Merl, PT 07/11/21 3:55 PM   [facility] @ [place], [place], [address] Phone: [phone]   Fax:  [phone]  Name: KRISTAIN FILO MRN: [id number] Date of Birth: Oct 11, 1931

## 2021-07-15 ENCOUNTER — Other Ambulatory Visit: Payer: Self-pay

## 2021-07-15 ENCOUNTER — Ambulatory Visit: Payer: Medicare Other

## 2021-07-15 DIAGNOSIS — M542 Cervicalgia: Secondary | ICD-10-CM

## 2021-07-15 DIAGNOSIS — R293 Abnormal posture: Secondary | ICD-10-CM

## 2021-07-15 DIAGNOSIS — M62830 Muscle spasm of back: Secondary | ICD-10-CM

## 2021-07-15 DIAGNOSIS — M4125 Other idiopathic scoliosis, thoracolumbar region: Secondary | ICD-10-CM

## 2021-07-15 DIAGNOSIS — M6281 Muscle weakness (generalized): Secondary | ICD-10-CM | POA: Diagnosis not present

## 2021-07-15 NOTE — Therapy (Signed)
[facility] @ [place], [place], [address] Phone: [phone]   Fax:  [phone]  Physical Therapy Treatment  Patient Details  Name: Janet Nguyen MRN: [id number] Date of Birth: 1931/11/09 Referring Provider (PT): Lawerance Cruel, MD   Encounter Date: 07/15/2021   PT End of Session - 07/15/21 1109     Visit Number 4    Date for PT Re-Evaluation 08/07/21    Authorization Type medicare    Progress Note Due on Visit 10    PT Start Time 1017    PT Stop Time 1104    PT Time Calculation (min) 47 min    Activity Tolerance Patient tolerated treatment well    Behavior During Therapy [facility] for tasks assessed/performed             Past Medical History:  Diagnosis Date   Arrhythmia    History of SVT with documented PVC'S and  PAC'S  12/08/12 Nuc stress test normal LV EF 74%  Event Monitor  12/01/12-01/03/13   Atrial flutter ([place])    Celiac disease    treated by Dr. [name]   GERD (gastroesophageal reflux disease)    Intervertebral disc stenosis of neural canal of cervical region    Irregular heart beat 11/30/12   ECHO-EF 60-65%   Osteoporosis    PMR (polymyalgia rheumatica) (HCC)    Dr. Marijean Bravo; pt states she was diagnosed 10-15 years ago, not treated at this time or any issues that she is aware of.   Scoliosis    Scoliosis    Sleep apnea 10/02/11 [facility]   Sleep study AHI -total sleep 10.3/hr  64.0/ hr during REM sleep.RDI 22.8/hr during total sleep 64.0/hr during REM sleep The lowest O2 sat during Non-REM and REM sleep was 86% and 88% respectively. 04/08/12 CPAP/BIPAP titration study [facility]    Past Surgical History:  Procedure Laterality Date   APPENDECTOMY     ruptured at age 44 and had surgery   CARDIAC CATHETERIZATION  01/27/06   cataract surgery  2015   Dr. Herbert Deaner; March & April 2015    There were no vitals filed for this visit.   Subjective  Assessment - 07/15/21 1021     Subjective I am better than last time.  My neck and back pain are constant.    Currently in Pain? Yes    Pain Score 4     Pain Location Back    Pain Orientation Anterior;Posterior;Lateral;Left    Pain Descriptors / Indicators Tightness;Aching;Sore    Pain Type Acute pain    Pain Onset More than a month ago    Pain Frequency Intermittent    Aggravating Factors  activity, pain is constant    Pain Relieving Factors Calm App, heat, standing                               [facility] Adult PT Treatment/Exercise - 07/15/21 0001       Neck Exercises: Machines for Strengthening   Nustep seat 8, level 2, 5 min   discontinued due to neck pain     Lumbar Exercises: Stretches   Figure 4 Stretch 1 rep;30 seconds;With overpressure    Figure 4 Stretch Limitations seated      Lumbar Exercises: Standing   Heel Raises 20 reps    Heel Raises Limitations bil UE support  Lumbar Exercises: Seated   Other Seated Lumbar Exercises ER with yellow band: 2x10    Other Seated Lumbar Exercises row with yellow band 2x10      Manual Therapy   Manual Therapy Soft tissue mobilization    Manual therapy comments seated in chair for neck and thoracic   ql, lumbar paraspinals, glut max and glut med   Soft tissue mobilization Bil upper traps, Lt periscapular and lats                       PT Short Term Goals - 06/26/21 1323       PT SHORT TERM GOAL #1   Title Pt will be ind and compliant with initial HEP at least 3x/week.    Time 3    Period Weeks    Status New    Target Date 07/17/21               PT Long Term Goals - 06/26/21 1324       PT LONG TERM GOAL #1   Title Pt will improve LE flexbility to [facility] to reduce undue strain on back.    Baseline -    Time 6    Period Weeks    Status New    Target Date 08/07/21      PT LONG TERM GOAL #2   Title Pt will improve scapular and UE strength to at least 4+/5 for improved postural  support and functional use of bil UEs    Time 6    Period Weeks    Status New    Target Date 08/07/21      PT LONG TERM GOAL #3   Title Pt will report compliance with HEP at least 4 times a week to improve strength and mobility    Baseline -    Time 6    Period Weeks    Status New    Target Date 08/07/21                   Plan - 07/15/21 1115     Clinical Impression Statement She has been compliant with intial HEP and reports that she does her exercises throughout the day.  She was able to tolerate postural strength and NuStep today without exacerbation of pain.  STM performed to Lt neck pain, periscapular and lats and tightness and trigger points in soft tissues today.  Pt opted to focus on this area today and address Lt thoracolumbar region next visit.  Pt with shortened Lt side and feelings of "fullness" after eating.  She is stretching regularly.  Progress HEP next visit to reflect ther ex from today.    Rehab Potential Good    PT Frequency 2x / week    PT Duration 6 weeks    PT Treatment/Interventions ADLs/Self Care Home Management;Moist Heat;Functional mobility training;Therapeutic exercise;Therapeutic activities;Patient/family education;Manual techniques;Passive range of motion;Energy conservation    PT Next Visit Plan attempt update HEP from 2nd visit ther ex, NuStep, mat based stretching and strength, STM Lt trunk    PT Home Exercise Plan Access Code: [email or web address]    Consulted and Agree with Plan of Care Patient             Patient will benefit from skilled therapeutic intervention in order to improve the following deficits and impairments:  Decreased range of motion, Increased fascial restricitons, Increased muscle spasms, Decreased activity tolerance, Pain, Hypomobility, Impaired flexibility, Improper body mechanics, Postural dysfunction, Decreased strength,  Decreased mobility  Visit Diagnosis: Other idiopathic scoliosis, thoracolumbar region  Muscle weakness  (generalized)  Abnormal posture  Cervicalgia  Muscle spasm of back     Problem List Patient Active Problem List   Diagnosis Date Noted   Deviated septum 05/08/2020   Mass of subcutaneous tissue 05/02/2020   Vertigo 06/16/2019   Pulmonary hypertension, unspecified ([place]) 04/14/2019   Ischemic colitis ([place]) 04/14/2019   Migraine with aura and without status migrainosus, not intractable 11/08/2018   Degeneration of lumbar intervertebral disc 10/14/2018   Hoarseness of voice 03/04/2018   Abdominal pain 07/01/2017   Diverticulitis, colon    Metabolic acidosis, increased anion gap    Constipation 04/14/2017   Lumbar hernia 04/14/2017   Presbycusis of both ears 01/10/2017   Tinnitus aurium, bilateral 01/10/2017   Gastroesophageal reflux disease 08/20/2016   Hemoptysis 08/20/2016   Obstructive sleep apnea of adult 08/20/2016   Rhinitis, chronic 08/20/2016   Throat pain in adult 08/20/2016   Fatigue 12/23/2015   Sciatica of right side 10/06/2015   History of migraine headaches 10/06/2015   Frequent PVCs 12/28/2013   Premature atrial contractions 12/28/2013   PSVT (paroxysmal supraventricular tachycardia) ([place]) 12/28/2013   Heart palpitations 07/13/2013   Sleep apnea 04/11/2013   Scoliosis 04/11/2013   Atrial flutter with rapid ventricular response ([place]) 11/29/2012   Chest pain, atypical 11/29/2012   Fibromyalgia syndrome 11/29/2012   Chronic steroid use 11/29/2012   Sigurd Sos, PT 07/15/21 11:17 AM   [facility] @ [place], [place], [address] Phone: [phone]   Fax:  [phone]  Name: Janet Nguyen MRN: [id number] Date of Birth: 07/19/1932

## 2021-07-18 ENCOUNTER — Encounter: Payer: Self-pay | Admitting: Physical Therapy

## 2021-07-18 ENCOUNTER — Other Ambulatory Visit: Payer: Self-pay

## 2021-07-18 ENCOUNTER — Ambulatory Visit: Payer: Medicare Other | Admitting: Physical Therapy

## 2021-07-18 DIAGNOSIS — R293 Abnormal posture: Secondary | ICD-10-CM

## 2021-07-18 DIAGNOSIS — M4125 Other idiopathic scoliosis, thoracolumbar region: Secondary | ICD-10-CM

## 2021-07-18 DIAGNOSIS — M542 Cervicalgia: Secondary | ICD-10-CM

## 2021-07-18 DIAGNOSIS — M62830 Muscle spasm of back: Secondary | ICD-10-CM

## 2021-07-18 DIAGNOSIS — M6281 Muscle weakness (generalized): Secondary | ICD-10-CM

## 2021-07-18 NOTE — Therapy (Signed)
[place] @ [place], [place], [address] Phone: [phone]   Fax:  [phone]  Physical Therapy Treatment  Patient Details  Name: Janet Nguyen MRN: [id number] Date of Birth: Jul 12, 1932 Referring Provider (PT): Lawerance Cruel, MD   Encounter Date: 07/18/2021   PT End of Session - 07/18/21 1107     Visit Number 5    Date for PT Re-Evaluation 08/07/21    Authorization Type medicare - KX now    Progress Note Due on Visit 10    PT Start Time 1100    PT Stop Time 1142    PT Time Calculation (min) 42 min    Activity Tolerance Patient tolerated treatment well    Behavior During Therapy [facility] for tasks assessed/performed             Past Medical History:  Diagnosis Date   Arrhythmia    History of SVT with documented PVC'S and  PAC'S  12/08/12 Nuc stress test normal LV EF 74%  Event Monitor  12/01/12-01/03/13   Atrial flutter ([place])    Celiac disease    treated by Dr. [name]   GERD (gastroesophageal reflux disease)    Intervertebral disc stenosis of neural canal of cervical region    Irregular heart beat 11/30/12   ECHO-EF 60-65%   Osteoporosis    PMR (polymyalgia rheumatica) (HCC)    Dr. Marijean Bravo; pt states she was diagnosed 10-15 years ago, not treated at this time or any issues that she is aware of.   Scoliosis    Scoliosis    Sleep apnea 10/02/11 [facility]   Sleep study AHI -total sleep 10.3/hr  64.0/ hr during REM sleep.RDI 22.8/hr during total sleep 64.0/hr during REM sleep The lowest O2 sat during Non-REM and REM sleep was 86% and 88% respectively. 04/08/12 CPAP/BIPAP titration study [facility]    Past Surgical History:  Procedure Laterality Date   APPENDECTOMY     ruptured at age 83 and had surgery   CARDIAC CATHETERIZATION  01/27/06   cataract surgery  2015   Dr. Herbert Deaner; March & April 2015    There were no vitals filed for this visit.    Subjective Assessment - 07/18/21 1103     Subjective I am doing ok.  I continue to do some exercises each day.  My left ribcage constantly catches on my pelvis.    Pertinent History scoliosis    Limitations Lifting;House hold activities;Sitting    How long can you sit comfortably? 5-10 min    How long can you stand comfortably? standing is better than sitting    How long can you walk comfortably? limping while walking    Patient Stated Goals help relax the muscles, learn more to [place] and not to dos to guide activity    Currently in Pain? Yes    Pain Score 8     Pain Location Back   whole body/back   Pain Descriptors / Indicators Tightness;Aching;Sore    Pain Type Acute pain;Chronic pain    Pain Onset More than a month ago    Pain Frequency Constant    Aggravating Factors  activity    Pain Relieving Factors Calm App, heat, standing                               [facility] Adult PT Treatment/Exercise - 07/18/21 0001  Exercises   Exercises Shoulder;Lumbar;Knee/Hip      Neck Exercises: Machines for Strengthening   Nustep seat 9 arms 10 L2 x 5' PT present to discuss pain pattern      Lumbar Exercises: Stretches   Lower Trunk Rotation 2 reps    Figure 4 Stretch 1 rep;30 seconds;With overpressure    Figure 4 Stretch Limitations seated mat table + pad    Other Lumbar Stretch Exercise seated Rt SB with overhead reach for Lt QL stretch x 30"      Lumbar Exercises: Supine   Other Supine Lumbar Exercises ab bicycle x 20"      Shoulder Exercises: Supine   Horizontal ABduction Strengthening;Both;20 reps;Theraband    Theraband Level (Shoulder Horizontal ABduction) Level 1 (Yellow)      Shoulder Exercises: Seated   External Rotation Strengthening;Both;Theraband;10 reps    Theraband Level (Shoulder External Rotation) Level 1 (Yellow)      Manual Therapy   Manual Therapy Soft tissue mobilization    Manual therapy comments supine and Rt SL    Soft tissue mobilization  paraspinals entire length from cervical to lumbar, obliques on Lt, Lt lat, Lt posterior shoulder, upper traps bil                       PT Short Term Goals - 06/26/21 1323       PT SHORT TERM GOAL #1   Title Pt will be ind and compliant with initial HEP at least 3x/week.    Time 3    Period Weeks    Status New    Target Date 07/17/21               PT Long Term Goals - 06/26/21 1324       PT LONG TERM GOAL #1   Title Pt will improve LE flexbility to [facility] to reduce undue strain on back.    Baseline -    Time 6    Period Weeks    Status New    Target Date 08/07/21      PT LONG TERM GOAL #2   Title Pt will improve scapular and UE strength to at least 4+/5 for improved postural support and functional use of bil UEs    Time 6    Period Weeks    Status New    Target Date 08/07/21      PT LONG TERM GOAL #3   Title Pt will report compliance with HEP at least 4 times a week to improve strength and mobility    Baseline -    Time 6    Period Weeks    Status New    Target Date 08/07/21                   Plan - 07/18/21 1154     Clinical Impression Statement Pt stretching regularly and sprinkling in walks around neighborhood block and postural ther ex each day.  She was able to tolerate postural strength and NuStep today without exacerbation of pain. STM performed to entire length of paraspinals with extra focus on Lt lateral trunk and posterior shoulder for elongation of short side of scoliosis.  Pt continues to have varying range of chronic pain diffusely located secondary to severe scoliosis.  Pt with shortened Lt side and feelings of "fullness" after eating.  She will continue to benefit from skilled progression of postural strength and stretching for elongation paired with manual therapy for pain relief.  PT Frequency 2x / week    PT Duration 6 weeks    PT Treatment/Interventions ADLs/Self Care Home Management;Moist Heat;Functional mobility  training;Therapeutic exercise;Therapeutic activities;Patient/family education;Manual techniques;Passive range of motion;Energy conservation    PT Next Visit Plan attempt update HEP from 2nd visit ther ex, NuStep, mat based stretching and strength, STM Lt trunk    PT Home Exercise Plan Access Code: [email or web address]    Consulted and Agree with Plan of Care Patient             Patient will benefit from skilled therapeutic intervention in order to improve the following deficits and impairments:     Visit Diagnosis: Other idiopathic scoliosis, thoracolumbar region  Muscle weakness (generalized)  Abnormal posture  Cervicalgia  Muscle spasm of back     Problem List Patient Active Problem List   Diagnosis Date Noted   Deviated septum 05/08/2020   Mass of subcutaneous tissue 05/02/2020   Vertigo 06/16/2019   Pulmonary hypertension, unspecified ([place]) 04/14/2019   Ischemic colitis ([place]) 04/14/2019   Migraine with aura and without status migrainosus, not intractable 11/08/2018   Degeneration of lumbar intervertebral disc 10/14/2018   Hoarseness of voice 03/04/2018   Abdominal pain 07/01/2017   Diverticulitis, colon    Metabolic acidosis, increased anion gap    Constipation 04/14/2017   Lumbar hernia 04/14/2017   Presbycusis of both ears 01/10/2017   Tinnitus aurium, bilateral 01/10/2017   Gastroesophageal reflux disease 08/20/2016   Hemoptysis 08/20/2016   Obstructive sleep apnea of adult 08/20/2016   Rhinitis, chronic 08/20/2016   Throat pain in adult 08/20/2016   Fatigue 12/23/2015   Sciatica of right side 10/06/2015   History of migraine headaches 10/06/2015   Frequent PVCs 12/28/2013   Premature atrial contractions 12/28/2013   PSVT (paroxysmal supraventricular tachycardia) (HCC) 12/28/2013   Heart palpitations 07/13/2013   Sleep apnea 04/11/2013   Scoliosis 04/11/2013   Atrial flutter with rapid ventricular response ([place]) 11/29/2012   Chest pain, atypical 11/29/2012    Fibromyalgia syndrome 11/29/2012   Chronic steroid use 11/29/2012    Baruch Merl, PT 07/18/21 11:57 AM   [facility] @ [place], [place], [address] Phone: [phone]   Fax:  [phone]  Name: Janet Nguyen MRN: [id number] Date of Birth: Feb 21, 1932

## 2021-07-22 ENCOUNTER — Other Ambulatory Visit: Payer: Self-pay

## 2021-07-22 ENCOUNTER — Ambulatory Visit: Payer: Medicare Other

## 2021-07-22 DIAGNOSIS — M4125 Other idiopathic scoliosis, thoracolumbar region: Secondary | ICD-10-CM

## 2021-07-22 DIAGNOSIS — R293 Abnormal posture: Secondary | ICD-10-CM | POA: Diagnosis not present

## 2021-07-22 DIAGNOSIS — M6281 Muscle weakness (generalized): Secondary | ICD-10-CM | POA: Diagnosis not present

## 2021-07-22 DIAGNOSIS — M542 Cervicalgia: Secondary | ICD-10-CM

## 2021-07-22 DIAGNOSIS — M62830 Muscle spasm of back: Secondary | ICD-10-CM

## 2021-07-22 NOTE — Therapy (Signed)
[facility] @ [place], [place], [address] Phone: [phone]   Fax:  [phone]  Physical Therapy Treatment  Patient Details  Name: Janet Nguyen MRN: [id number] Date of Birth: 25-Nov-1931 Referring Provider (PT): Lawerance Cruel, MD   Encounter Date: 07/22/2021   PT End of Session - 07/22/21 1142     Visit Number 6    Date for PT Re-Evaluation 08/07/21    Authorization Type medicare - KX now    Progress Note Due on Visit 10    PT Start Time 1101    PT Stop Time 1143    PT Time Calculation (min) 42 min    Activity Tolerance Patient tolerated treatment well    Behavior During Therapy [facility] for tasks assessed/performed             Past Medical History:  Diagnosis Date   Arrhythmia    History of SVT with documented PVC'S and  PAC'S  12/08/12 Nuc stress test normal LV EF 74%  Event Monitor  12/01/12-01/03/13   Atrial flutter ([place])    Celiac disease    treated by Dr. [name]   GERD (gastroesophageal reflux disease)    Intervertebral disc stenosis of neural canal of cervical region    Irregular heart beat 11/30/12   ECHO-EF 60-65%   Osteoporosis    PMR (polymyalgia rheumatica) (HCC)    Dr. Marijean Bravo; pt states she was diagnosed 10-15 years ago, not treated at this time or any issues that she is aware of.   Scoliosis    Scoliosis    Sleep apnea 10/02/11 [facility]   Sleep study AHI -total sleep 10.3/hr  64.0/ hr during REM sleep.RDI 22.8/hr during total sleep 64.0/hr during REM sleep The lowest O2 sat during Non-REM and REM sleep was 86% and 88% respectively. 04/08/12 CPAP/BIPAP titration study [facility]    Past Surgical History:  Procedure Laterality Date   APPENDECTOMY     ruptured at age 87 and had surgery   CARDIAC CATHETERIZATION  01/27/06   cataract surgery  2015   Dr. Herbert Deaner; March & April 2015    There were no vitals filed for this visit.    Subjective Assessment - 07/22/21 1106     Subjective I am feeling discouraged.  I hurt all the time and I do need to be more social.    Currently in Pain? Yes    Pain Score 6     Pain Location Back    Pain Orientation Anterior    Pain Descriptors / Indicators Tightness;Aching;Sore    Pain Type Chronic pain;Acute pain    Pain Onset More than a month ago    Pain Frequency Constant    Aggravating Factors  activity    Pain Relieving Factors stretching, heat, standing                               [facility] Adult PT Treatment/Exercise - 07/22/21 0001       Neck Exercises: Machines for Strengthening   Nustep seat 9 arms 10 L2 x 5' PT present to discuss pain pattern      Lumbar Exercises: Stretches   Active Hamstring Stretch 3 reps;20 seconds    Active Hamstring Stretch Limitations seated    Figure 4 Stretch 3 reps;20 seconds    Figure 4 Stretch Limitations seated mat table + pad  Manual Therapy   Manual Therapy Soft tissue mobilization    Manual therapy comments neck, upper traps and lats in sitting .                       PT Short Term Goals - 06/26/21 1323       PT SHORT TERM GOAL #1   Title Pt will be ind and compliant with initial HEP at least 3x/week.    Time 3    Period Weeks    Status New    Target Date 07/17/21               PT Long Term Goals - 06/26/21 1324       PT LONG TERM GOAL #1   Title Pt will improve LE flexbility to [facility] to reduce undue strain on back.    Baseline -    Time 6    Period Weeks    Status New    Target Date 08/07/21      PT LONG TERM GOAL #2   Title Pt will improve scapular and UE strength to at least 4+/5 for improved postural support and functional use of bil UEs    Time 6    Period Weeks    Status New    Target Date 08/07/21      PT LONG TERM GOAL #3   Title Pt will report compliance with HEP at least 4 times a week to improve strength and mobility    Baseline -    Time 6    Period  Weeks    Status New    Target Date 08/07/21                   Plan - 07/22/21 1118     Clinical Impression Statement Pt stretching regularly , walking  around neighborhood block and postural ther ex each day.  Pt reports that she is frustrated by constant pain and feels like she is in a funk.  PT encouraged her to try to be social and get outside each day to improve mood and endorphins.  Manual work focused on neck and lats to improve tissue mobility today.  Pt continues to have varying range of chronic pain diffusely located secondary to severe scoliosis.  Pt with shortened Lt side and feelings of "fullness" after eating.  She will continue to benefit from skilled progression of postural strength and stretching for elongation paired with manual therapy for pain relief.    PT Frequency 2x / week    PT Duration 8 weeks    PT Treatment/Interventions ADLs/Self Care Home Management;Moist Heat;Functional mobility training;Therapeutic exercise;Therapeutic activities;Patient/family education;Manual techniques;Passive range of motion;Energy conservation    PT Next Visit Plan attempt update HEP from 2nd visit ther ex, NuStep, mat based stretching and strength, STM Lt trunk    PT Home Exercise Plan Access Code: [email or web address]    Recommended Other Services initial cert is signed    Consulted and Agree with Plan of Care Patient             Patient will benefit from skilled therapeutic intervention in order to improve the following deficits and impairments:  Decreased range of motion, Increased fascial restricitons, Increased muscle spasms, Decreased activity tolerance, Pain, Hypomobility, Impaired flexibility, Improper body mechanics, Postural dysfunction, Decreased strength, Decreased mobility  Visit Diagnosis: Muscle weakness (generalized)  Other idiopathic scoliosis, thoracolumbar region  Abnormal posture  Muscle spasm of back  Cervicalgia  Problem List Patient Active Problem  List   Diagnosis Date Noted   Deviated septum 05/08/2020   Mass of subcutaneous tissue 05/02/2020   Vertigo 06/16/2019   Pulmonary hypertension, unspecified ([place]) 04/14/2019   Ischemic colitis ([place]) 04/14/2019   Migraine with aura and without status migrainosus, not intractable 11/08/2018   Degeneration of lumbar intervertebral disc 10/14/2018   Hoarseness of voice 03/04/2018   Abdominal pain 07/01/2017   Diverticulitis, colon    Metabolic acidosis, increased anion gap    Constipation 04/14/2017   Lumbar hernia 04/14/2017   Presbycusis of both ears 01/10/2017   Tinnitus aurium, bilateral 01/10/2017   Gastroesophageal reflux disease 08/20/2016   Hemoptysis 08/20/2016   Obstructive sleep apnea of adult 08/20/2016   Rhinitis, chronic 08/20/2016   Throat pain in adult 08/20/2016   Fatigue 12/23/2015   Sciatica of right side 10/06/2015   History of migraine headaches 10/06/2015   Frequent PVCs 12/28/2013   Premature atrial contractions 12/28/2013   PSVT (paroxysmal supraventricular tachycardia) ([place]) 12/28/2013   Heart palpitations 07/13/2013   Sleep apnea 04/11/2013   Scoliosis 04/11/2013   Atrial flutter with rapid ventricular response ([place]) 11/29/2012   Chest pain, atypical 11/29/2012   Fibromyalgia syndrome 11/29/2012   Chronic steroid use 11/29/2012    Sigurd Sos, PT 07/22/21 11:44 AM  [facility] @ [place], [place], [address] Phone: [phone]   Fax:  [phone]  Name: Janet Nguyen MRN: [id number] Date of Birth: Aug 25, 1932

## 2021-07-24 ENCOUNTER — Encounter: Payer: Medicare Other | Admitting: Physical Therapy

## 2021-07-25 ENCOUNTER — Encounter: Payer: Self-pay | Admitting: Physical Therapy

## 2021-07-25 ENCOUNTER — Other Ambulatory Visit: Payer: Self-pay

## 2021-07-25 ENCOUNTER — Ambulatory Visit: Payer: Medicare Other | Admitting: Physical Therapy

## 2021-07-25 DIAGNOSIS — M62830 Muscle spasm of back: Secondary | ICD-10-CM

## 2021-07-25 DIAGNOSIS — R293 Abnormal posture: Secondary | ICD-10-CM | POA: Diagnosis not present

## 2021-07-25 DIAGNOSIS — M6281 Muscle weakness (generalized): Secondary | ICD-10-CM

## 2021-07-25 DIAGNOSIS — M542 Cervicalgia: Secondary | ICD-10-CM

## 2021-07-25 DIAGNOSIS — M4125 Other idiopathic scoliosis, thoracolumbar region: Secondary | ICD-10-CM

## 2021-07-25 NOTE — Therapy (Signed)
[place] @ [place], [place], [address] Phone: [phone]   Fax:  [phone]  Physical Therapy Treatment  Patient Details  Name: Janet Nguyen MRN: [id number] Date of Birth: 1932-02-07 Referring Provider (PT): Lawerance Cruel, MD   Encounter Date: 07/25/2021   PT End of Session - 07/25/21 1152     Visit Number 7    Date for PT Re-Evaluation 08/07/21    Authorization Type medicare - KX now    Progress Note Due on Visit 10    PT Start Time 1147    PT Stop Time 1226    PT Time Calculation (min) 39 min    Activity Tolerance Patient tolerated treatment well    Behavior During Therapy [facility] for tasks assessed/performed             Past Medical History:  Diagnosis Date   Arrhythmia    History of SVT with documented PVC'S and  PAC'S  12/08/12 Nuc stress test normal LV EF 74%  Event Monitor  12/01/12-01/03/13   Atrial flutter ([place])    Celiac disease    treated by Dr. [name]   GERD (gastroesophageal reflux disease)    Intervertebral disc stenosis of neural canal of cervical region    Irregular heart beat 11/30/12   ECHO-EF 60-65%   Osteoporosis    PMR (polymyalgia rheumatica) (HCC)    Dr. Marijean Bravo; pt states she was diagnosed 10-15 years ago, not treated at this time or any issues that she is aware of.   Scoliosis    Scoliosis    Sleep apnea 10/02/11 [facility]   Sleep study AHI -total sleep 10.3/hr  64.0/ hr during REM sleep.RDI 22.8/hr during total sleep 64.0/hr during REM sleep The lowest O2 sat during Non-REM and REM sleep was 86% and 88% respectively. 04/08/12 CPAP/BIPAP titration study [facility]    Past Surgical History:  Procedure Laterality Date   APPENDECTOMY     ruptured at age 40 and had surgery   CARDIAC CATHETERIZATION  01/27/06   cataract surgery  2015   Dr. Herbert Deaner; March & April 2015    There were no vitals filed for this visit.    Subjective Assessment - 07/25/21 1148     Subjective I am about the same.  My left ribcage has been catching quite a bit.  I stretched before I came.    Pertinent History scoliosis    Limitations Lifting;House hold activities;Sitting    How long can you sit comfortably? 5-10 min    How long can you stand comfortably? standing is better than sitting    How long can you walk comfortably? limping while walking    Patient Stated Goals help relax the muscles, learn more to [place] and not to dos to guide activity    Currently in Pain? Yes    Pain Score 6     Pain Location Back    Pain Descriptors / Indicators Tightness;Aching;Sore    Pain Onset More than a month ago    Pain Frequency Constant    Aggravating Factors  activity    Pain Relieving Factors stretching, heat, standing    Effect of Pain on Daily Activities has help with vaccuuming                               [facility] Adult PT Treatment/Exercise - 07/25/21 0001  Exercises   Exercises Shoulder;Lumbar;Knee/Hip      Neck Exercises: Machines for Strengthening   Nustep seat 9 arms 10 L2 x 5' PT present to discuss pain pattern      Lumbar Exercises: Stretches   Lower Trunk Rotation 2 reps;10 seconds    Figure 4 Stretch 3 reps;20 seconds    Figure 4 Stretch Limitations seated mat table + pad      Lumbar Exercises: Supine   Other Supine Lumbar Exercises semi-reclined LE scissors and bicycle 1x30 sec each      Manual Therapy   Manual Therapy Soft tissue mobilization    Manual therapy comments supine and Rt SL    Soft tissue mobilization paraspinals entire length from cervical to lumbar, obliques on Lt, Lt lat, Lt posterior shoulder, upper traps bil                       PT Short Term Goals - 06/26/21 1323       PT SHORT TERM GOAL #1   Title Pt will be ind and compliant with initial HEP at least 3x/week.    Time 3    Period Weeks    Status New    Target Date 07/17/21                PT Long Term Goals - 06/26/21 1324       PT LONG TERM GOAL #1   Title Pt will improve LE flexbility to [facility] to reduce undue strain on back.    Baseline -    Time 6    Period Weeks    Status New    Target Date 08/07/21      PT LONG TERM GOAL #2   Title Pt will improve scapular and UE strength to at least 4+/5 for improved postural support and functional use of bil UEs    Time 6    Period Weeks    Status New    Target Date 08/07/21      PT LONG TERM GOAL #3   Title Pt will report compliance with HEP at least 4 times a week to improve strength and mobility    Baseline -    Time 6    Period Weeks    Status New    Target Date 08/07/21                   Plan - 07/25/21 1226     Clinical Impression Statement Pt continues to stretch regularly and performs short distance walks outside each day.  She continues to have chronic pain and limited mobility related to severe scoliosis.  Pt demonstrates greater depth of streching tolerance today.  Pt continues to benefit from skilled PT for manual techniques to promote tissue and spinal elongation.  She states she gets several days of relief between sessions and felt taller end of session today.    Rehab Potential Good    PT Frequency 2x / week    PT Duration 8 weeks    PT Treatment/Interventions ADLs/Self Care Home Management;Moist Heat;Functional mobility training;Therapeutic exercise;Therapeutic activities;Patient/family education;Manual techniques;Passive range of motion;Energy conservation    PT Next Visit Plan NuStep, mat based stretching and strength, STM Lt trunk and bil neck    PT Home Exercise Plan Access Code: [email or web address]    Consulted and Agree with Plan of Care Patient             Patient will benefit from skilled therapeutic intervention  in order to improve the following deficits and impairments:     Visit Diagnosis: Muscle weakness (generalized)  Other idiopathic scoliosis, thoracolumbar region  Abnormal  posture  Muscle spasm of back  Cervicalgia     Problem List Patient Active Problem List   Diagnosis Date Noted   Deviated septum 05/08/2020   Mass of subcutaneous tissue 05/02/2020   Vertigo 06/16/2019   Pulmonary hypertension, unspecified ([place]) 04/14/2019   Ischemic colitis ([place]) 04/14/2019   Migraine with aura and without status migrainosus, not intractable 11/08/2018   Degeneration of lumbar intervertebral disc 10/14/2018   Hoarseness of voice 03/04/2018   Abdominal pain 07/01/2017   Diverticulitis, colon    Metabolic acidosis, increased anion gap    Constipation 04/14/2017   Lumbar hernia 04/14/2017   Presbycusis of both ears 01/10/2017   Tinnitus aurium, bilateral 01/10/2017   Gastroesophageal reflux disease 08/20/2016   Hemoptysis 08/20/2016   Obstructive sleep apnea of adult 08/20/2016   Rhinitis, chronic 08/20/2016   Throat pain in adult 08/20/2016   Fatigue 12/23/2015   Sciatica of right side 10/06/2015   History of migraine headaches 10/06/2015   Frequent PVCs 12/28/2013   Premature atrial contractions 12/28/2013   PSVT (paroxysmal supraventricular tachycardia) ([place]) 12/28/2013   Heart palpitations 07/13/2013   Sleep apnea 04/11/2013   Scoliosis 04/11/2013   Atrial flutter with rapid ventricular response ([place]) 11/29/2012   Chest pain, atypical 11/29/2012   Fibromyalgia syndrome 11/29/2012   Chronic steroid use 11/29/2012    Baruch Merl, PT 07/25/21 12:30 PM   [place] @ [place], [place], [address] Phone: [phone]   Fax:  [phone]  Name: Janet Nguyen MRN: [id number] Date of Birth: 07-26-1932

## 2021-07-30 ENCOUNTER — Other Ambulatory Visit: Payer: Self-pay

## 2021-07-30 ENCOUNTER — Encounter: Payer: Self-pay | Admitting: Physical Therapy

## 2021-07-30 ENCOUNTER — Ambulatory Visit: Payer: Medicare Other | Attending: Family Medicine | Admitting: Physical Therapy

## 2021-07-30 DIAGNOSIS — G8929 Other chronic pain: Secondary | ICD-10-CM | POA: Diagnosis not present

## 2021-07-30 DIAGNOSIS — M62830 Muscle spasm of back: Secondary | ICD-10-CM | POA: Insufficient documentation

## 2021-07-30 DIAGNOSIS — M542 Cervicalgia: Secondary | ICD-10-CM | POA: Diagnosis not present

## 2021-07-30 DIAGNOSIS — R293 Abnormal posture: Secondary | ICD-10-CM | POA: Diagnosis not present

## 2021-07-30 DIAGNOSIS — M6281 Muscle weakness (generalized): Secondary | ICD-10-CM | POA: Insufficient documentation

## 2021-07-30 DIAGNOSIS — M545 Low back pain, unspecified: Secondary | ICD-10-CM | POA: Insufficient documentation

## 2021-07-30 DIAGNOSIS — M4125 Other idiopathic scoliosis, thoracolumbar region: Secondary | ICD-10-CM | POA: Insufficient documentation

## 2021-07-30 NOTE — Therapy (Signed)
[facility] @ [address] [place], [place], [address] Phone: [phone]   Fax:  [phone]  Physical Therapy Treatment  Patient Details  Name: Janet Nguyen MRN: [id number] Date of Birth: 1931/10/09 Referring Provider (PT): Lawerance Cruel, MD   Encounter Date: 07/30/2021   PT End of Session - 07/30/21 1107     Visit Number 8    Date for PT Re-Evaluation 08/07/21    Authorization Type medicare - KX now    Progress Note Due on Visit 10    PT Start Time 1104    PT Stop Time 1144    PT Time Calculation (min) 40 min    Activity Tolerance Patient tolerated treatment well    Behavior During Therapy [facility] for tasks assessed/performed             Past Medical History:  Diagnosis Date   Arrhythmia    History of SVT with documented PVC'S and  PAC'S  12/08/12 Nuc stress test normal LV EF 74%  Event Monitor  12/01/12-01/03/13   Atrial flutter ([place])    Celiac disease    treated by Dr. [name]   GERD (gastroesophageal reflux disease)    Intervertebral disc stenosis of neural canal of cervical region    Irregular heart beat 11/30/12   ECHO-EF 60-65%   Osteoporosis    PMR (polymyalgia rheumatica) (HCC)    Dr. Marijean Bravo; pt states she was diagnosed 10-15 years ago, not treated at this time or any issues that she is aware of.   Scoliosis    Scoliosis    Sleep apnea 10/02/11 [facility]   Sleep study AHI -total sleep 10.3/hr  64.0/ hr during REM sleep.RDI 22.8/hr during total sleep 64.0/hr during REM sleep The lowest O2 sat during Non-REM and REM sleep was 86% and 88% respectively. 04/08/12 CPAP/BIPAP titration study [facility]    Past Surgical History:  Procedure Laterality Date   APPENDECTOMY     ruptured at age 54 and had surgery   CARDIAC CATHETERIZATION  01/27/06   cataract surgery  2015   Dr. Herbert Deaner; March & April 2015    There were no vitals filed for this visit.   Subjective  Assessment - 07/30/21 1106     Subjective I am about the same.  I have some burning in Lt ribcage which may be indigestion.  I have variable relief with PT sessions so far.    Pertinent History scoliosis    Limitations Lifting;House hold activities;Sitting    How long can you sit comfortably? 5-10 min    How long can you stand comfortably? standing is better than sitting    Patient Stated Goals help relax the muscles, learn more to dos and not to dos to guide activity    Currently in Pain? Yes    Pain Location Rib cage    Pain Orientation Left    Pain Descriptors / Indicators Tightness;Burning    Pain Type Chronic pain;Acute pain                               [facility] Adult PT Treatment/Exercise - 07/30/21 0001       Exercises   Exercises Shoulder;Lumbar;Knee/Hip      Lumbar Exercises: Stretches   Lower Trunk Rotation 5 reps   to each side   Figure 4 Stretch Supine;20 seconds;1 rep;With overpressure    Other Lumbar  Stretch Exercise Lt QL doorway stretch 1x30 sec      Lumbar Exercises: Aerobic   Nustep L2 x 5' PT present to discuss progress and pain      Lumbar Exercises: Supine   Other Supine Lumbar Exercises semi-reclined LE scissors and bicycle 1x30 sec each      Shoulder Exercises: Standing   Other Standing Exercises doorway stretch 1x20 sec      Shoulder Exercises: Power [employer] 15 reps    Row Limitations 5lb, standing    Other Power Tower Exercises lat pulldown 10lb x 15 reps, standing, VC to slow down eccentric phase      Manual Therapy   Manual Therapy Soft tissue mobilization    Manual therapy comments seated in chair    Soft tissue mobilization bil upper traps, levator and cervical paraspinals, thoracic paraspinals, obliques, Lt QL, Lt lat                       PT Short Term Goals - 06/26/21 1323       PT SHORT TERM GOAL #1   Title Pt will be ind and compliant with initial HEP at least 3x/week.    Time 3    Period Weeks     Status New    Target Date 07/17/21               PT Long Term Goals - 06/26/21 1324       PT LONG TERM GOAL #1   Title Pt will improve LE flexbility to [facility] to reduce undue strain on back.    Baseline -    Time 6    Period Weeks    Status New    Target Date 08/07/21      PT LONG TERM GOAL #2   Title Pt will improve scapular and UE strength to at least 4+/5 for improved postural support and functional use of bil UEs    Time 6    Period Weeks    Status New    Target Date 08/07/21      PT LONG TERM GOAL #3   Title Pt will report compliance with HEP at least 4 times a week to improve strength and mobility    Baseline -    Time 6    Period Weeks    Status New    Target Date 08/07/21                   Plan - 07/30/21 1108     Clinical Impression Statement Pt reports variable pain since starting PT.  She feels she is able to do as much as she does due to the improvements she feels in mobility and even with sort term relief between visits.  Pt was able to progress postural strength to include lat pulldown and row with power tower today with VC for proper standing posture within ther ex.  She had increased cervical soft tissue tension today so manual therapy focused on this region, bilaterally, from chair with good release working superficial to deep.  Continue along POC.    PT Frequency 2x / week    PT Duration 8 weeks    PT Treatment/Interventions ADLs/Self Care Home Management;Moist Heat;Functional mobility training;Therapeutic exercise;Therapeutic activities;Patient/family education;Manual techniques;Passive range of motion;Energy conservation    PT Next Visit Plan NuStep, mat based stretching and strength, STM Lt trunk and bil neck    PT Home Exercise Plan Access Code: [email or web address]  Consulted and Agree with Plan of Care Patient             Patient will benefit from skilled therapeutic intervention in order to improve the following deficits and impairments:      Visit Diagnosis: Muscle weakness (generalized)  Other idiopathic scoliosis, thoracolumbar region  Abnormal posture  Muscle spasm of back  Cervicalgia  Chronic bilateral low back pain without sciatica     Problem List Patient Active Problem List   Diagnosis Date Noted   Deviated septum 05/08/2020   Mass of subcutaneous tissue 05/02/2020   Vertigo 06/16/2019   Pulmonary hypertension, unspecified ([place]) 04/14/2019   Ischemic colitis ([place]) 04/14/2019   Migraine with aura and without status migrainosus, not intractable 11/08/2018   Degeneration of lumbar intervertebral disc 10/14/2018   Hoarseness of voice 03/04/2018   Abdominal pain 07/01/2017   Diverticulitis, colon    Metabolic acidosis, increased anion gap    Constipation 04/14/2017   Lumbar hernia 04/14/2017   Presbycusis of both ears 01/10/2017   Tinnitus aurium, bilateral 01/10/2017   Gastroesophageal reflux disease 08/20/2016   Hemoptysis 08/20/2016   Obstructive sleep apnea of adult 08/20/2016   Rhinitis, chronic 08/20/2016   Throat pain in adult 08/20/2016   Fatigue 12/23/2015   Sciatica of right side 10/06/2015   History of migraine headaches 10/06/2015   Frequent PVCs 12/28/2013   Premature atrial contractions 12/28/2013   PSVT (paroxysmal supraventricular tachycardia) ([place]) 12/28/2013   Heart palpitations 07/13/2013   Sleep apnea 04/11/2013   Scoliosis 04/11/2013   Atrial flutter with rapid ventricular response ([place]) 11/29/2012   Chest pain, atypical 11/29/2012   Fibromyalgia syndrome 11/29/2012   Chronic steroid use 11/29/2012    Baruch Merl, PT 07/30/21 11:46 AM   [facility] @ [address] [place], [place], [address] Phone: [phone]   Fax:  [phone]  Name: Janet Nguyen MRN: [id number] Date of Birth: 1932-02-22

## 2021-07-31 ENCOUNTER — Encounter: Payer: Medicare Other | Admitting: Physical Therapy

## 2021-08-01 ENCOUNTER — Encounter: Payer: Self-pay | Admitting: Physical Therapy

## 2021-08-01 ENCOUNTER — Ambulatory Visit: Payer: Medicare Other | Admitting: Physical Therapy

## 2021-08-01 ENCOUNTER — Other Ambulatory Visit: Payer: Self-pay

## 2021-08-01 DIAGNOSIS — M542 Cervicalgia: Secondary | ICD-10-CM | POA: Diagnosis not present

## 2021-08-01 DIAGNOSIS — M6281 Muscle weakness (generalized): Secondary | ICD-10-CM | POA: Diagnosis not present

## 2021-08-01 DIAGNOSIS — M4125 Other idiopathic scoliosis, thoracolumbar region: Secondary | ICD-10-CM | POA: Diagnosis not present

## 2021-08-01 DIAGNOSIS — M62830 Muscle spasm of back: Secondary | ICD-10-CM | POA: Diagnosis not present

## 2021-08-01 DIAGNOSIS — R293 Abnormal posture: Secondary | ICD-10-CM | POA: Diagnosis not present

## 2021-08-01 DIAGNOSIS — G8929 Other chronic pain: Secondary | ICD-10-CM

## 2021-08-01 DIAGNOSIS — M545 Low back pain, unspecified: Secondary | ICD-10-CM | POA: Diagnosis not present

## 2021-08-01 NOTE — Therapy (Signed)
[facility] @ [address] [place], [place], [address] Phone: [phone]   Fax:  [phone]  Physical Therapy Treatment  Patient Details  Name: Janet Nguyen MRN: [id number] Date of Birth: Jun 07, 1932 Referring Provider (PT): Lawerance Cruel, MD   Encounter Date: 08/01/2021   PT End of Session - 08/01/21 1146     Visit Number 9    Date for PT Re-Evaluation 08/07/21    Authorization Type medicare - KX now    Progress Note Due on Visit 10    PT Start Time 1105    PT Stop Time 1146    PT Time Calculation (min) 41 min    Activity Tolerance Patient tolerated treatment well    Behavior During Therapy [facility] for tasks assessed/performed             Past Medical History:  Diagnosis Date   Arrhythmia    History of SVT with documented PVC'S and  PAC'S  12/08/12 Nuc stress test normal LV EF 74%  Event Monitor  12/01/12-01/03/13   Atrial flutter ([place])    Celiac disease    treated by Dr. [name]   GERD (gastroesophageal reflux disease)    Intervertebral disc stenosis of neural canal of cervical region    Irregular heart beat 11/30/12   ECHO-EF 60-65%   Osteoporosis    PMR (polymyalgia rheumatica) (HCC)    Dr. Marijean Bravo; pt states she was diagnosed 10-15 years ago, not treated at this time or any issues that she is aware of.   Scoliosis    Scoliosis    Sleep apnea 10/02/11 [facility]   Sleep study AHI -total sleep 10.3/hr  64.0/ hr during REM sleep.RDI 22.8/hr during total sleep 64.0/hr during REM sleep The lowest O2 sat during Non-REM and REM sleep was 86% and 88% respectively. 04/08/12 CPAP/BIPAP titration study [facility]    Past Surgical History:  Procedure Laterality Date   APPENDECTOMY     ruptured at age 4 and had surgery   CARDIAC CATHETERIZATION  01/27/06   cataract surgery  2015   Dr. Herbert Deaner; March & April 2015    There were no vitals filed for this visit.   Subjective  Assessment - 08/01/21 1110     Subjective I am getting concerned about my ribcage catching on my pelvis on the left side.  I think my skin may have reacted to the massage cream last time so I brought my own today just in case.    Pertinent History scoliosis    Limitations Lifting;House hold activities;Sitting    How long can you sit comfortably? 5-10 min    How long can you stand comfortably? standing is better than sitting    How long can you walk comfortably? limping while walking    Patient Stated Goals help relax the muscles, learn more to [place] and not to dos to guide activity    Currently in Pain? Yes    Pain Score 7     Pain Location Rib cage    Pain Orientation Left    Pain Descriptors / Indicators Burning;Tightness    Pain Type Chronic pain    Pain Onset More than a month ago    Pain Frequency Constant                               [facility] Adult PT Treatment/Exercise - 08/01/21 0001  Exercises   Exercises Shoulder;Lumbar;Knee/Hip      Lumbar Exercises: Stretches   Other Lumbar Stretch Exercise Lt QL doorway stretch 1x30 sec      Lumbar Exercises: Aerobic   Nustep L2 x 5' seat 9 PT present to discuss status      Shoulder Exercises: Power [employer] 20 reps    Row Limitations 5lb, standing    Other Power [employer] Exercises lat pulldown 10lb x 15 reps, standing, VC to slow down eccentric phase      Manual Therapy   Manual Therapy Soft tissue mobilization    Manual therapy comments seated in chair and Rt SL    Soft tissue mobilization bil upper traps, levator and cervical paraspinals, thoracic paraspinals, obliques, Lt QL, Lt lat                       PT Short Term Goals - 06/26/21 1323       PT SHORT TERM GOAL #1   Title Pt will be ind and compliant with initial HEP at least 3x/week.    Time 3    Period Weeks    Status New    Target Date 07/17/21               PT Long Term Goals - 06/26/21 1324       PT LONG TERM  GOAL #1   Title Pt will improve LE flexbility to [facility] to reduce undue strain on back.    Baseline -    Time 6    Period Weeks    Status New    Target Date 08/07/21      PT LONG TERM GOAL #2   Title Pt will improve scapular and UE strength to at least 4+/5 for improved postural support and functional use of bil UEs    Time 6    Period Weeks    Status New    Target Date 08/07/21      PT LONG TERM GOAL #3   Title Pt will report compliance with HEP at least 4 times a week to improve strength and mobility    Baseline -    Time 6    Period Weeks    Status New    Target Date 08/07/21                   Plan - 08/01/21 1319     Clinical Impression Statement Pt continues to be concerned about increased occurence of left ribcage catching on left pelvis.  She had more isolated local tightness in Rt levator and upper trap today demo'ing good carry over of remaining upper quadrant soft tissue release from last session.  She feels her skin may have reacted to massage cream last time so PT used cocoa butter based cream today as we know this was tolerated in past.  PT worked to open left trunk to elongate soft tissues between and around Lt ribcage and hemipelvis today with good tolerance.  Pt does well with standing row and lat pulldown machines with need for VC to slow down eccentric phases.  Continue along POC.    PT Frequency 2x / week    PT Duration 8 weeks    PT Treatment/Interventions ADLs/Self Care Home Management;Moist Heat;Functional mobility training;Therapeutic exercise;Therapeutic activities;Patient/family education;Manual techniques;Passive range of motion;Energy conservation    PT Next Visit Plan 10th visit PN next time, NuStep, mat based stretching and strength, STM Lt trunk and bil  neck    PT Home Exercise Plan Access Code: [email or web address]    [email or web address] and Agree with Plan of Care Patient             Patient will benefit from skilled therapeutic intervention in order to  improve the following deficits and impairments:     Visit Diagnosis: Muscle weakness (generalized)  Other idiopathic scoliosis, thoracolumbar region  Abnormal posture  Muscle spasm of back  Cervicalgia  Chronic bilateral low back pain without sciatica     Problem List Patient Active Problem List   Diagnosis Date Noted   Deviated septum 05/08/2020   Mass of subcutaneous tissue 05/02/2020   Vertigo 06/16/2019   Pulmonary hypertension, unspecified ([place]) 04/14/2019   Ischemic colitis ([place]) 04/14/2019   Migraine with aura and without status migrainosus, not intractable 11/08/2018   Degeneration of lumbar intervertebral disc 10/14/2018   Hoarseness of voice 03/04/2018   Abdominal pain 07/01/2017   Diverticulitis, colon    Metabolic acidosis, increased anion gap    Constipation 04/14/2017   Lumbar hernia 04/14/2017   Presbycusis of both ears 01/10/2017   Tinnitus aurium, bilateral 01/10/2017   Gastroesophageal reflux disease 08/20/2016   Hemoptysis 08/20/2016   Obstructive sleep apnea of adult 08/20/2016   Rhinitis, chronic 08/20/2016   Throat pain in adult 08/20/2016   Fatigue 12/23/2015   Sciatica of right side 10/06/2015   History of migraine headaches 10/06/2015   Frequent PVCs 12/28/2013   Premature atrial contractions 12/28/2013   PSVT (paroxysmal supraventricular tachycardia) ([place]) 12/28/2013   Heart palpitations 07/13/2013   Sleep apnea 04/11/2013   Scoliosis 04/11/2013   Atrial flutter with rapid ventricular response ([place]) 11/29/2012   Chest pain, atypical 11/29/2012   Fibromyalgia syndrome 11/29/2012   Chronic steroid use 11/29/2012    Baruch Merl, PT 08/01/21 1:22 PM   [facility] @ [address] [place], [place], [address] Phone: [phone]   Fax:  [phone]  Name: Janet Nguyen MRN: [id number] Date of Birth: [birth year]

## 2021-08-07 ENCOUNTER — Encounter: Payer: Self-pay | Admitting: Physical Therapy

## 2021-08-07 ENCOUNTER — Other Ambulatory Visit: Payer: Self-pay

## 2021-08-07 ENCOUNTER — Ambulatory Visit: Payer: Medicare Other | Admitting: Physical Therapy

## 2021-08-07 DIAGNOSIS — M542 Cervicalgia: Secondary | ICD-10-CM

## 2021-08-07 DIAGNOSIS — M62830 Muscle spasm of back: Secondary | ICD-10-CM

## 2021-08-07 DIAGNOSIS — R293 Abnormal posture: Secondary | ICD-10-CM

## 2021-08-07 DIAGNOSIS — M4125 Other idiopathic scoliosis, thoracolumbar region: Secondary | ICD-10-CM | POA: Diagnosis not present

## 2021-08-07 DIAGNOSIS — M6281 Muscle weakness (generalized): Secondary | ICD-10-CM

## 2021-08-07 DIAGNOSIS — G8929 Other chronic pain: Secondary | ICD-10-CM

## 2021-08-07 DIAGNOSIS — M545 Low back pain, unspecified: Secondary | ICD-10-CM | POA: Diagnosis not present

## 2021-08-07 NOTE — Therapy (Signed)
[facility] @ [address] [place], [place], [address] Phone: [phone]   Fax:  [phone]  Physical Therapy Treatment  Patient Details  Name: Janet Nguyen MRN: [id number] Date of Birth: 23-Dec-1931 Referring Provider (PT): Lawerance Cruel, MD   Progress Note Reporting Period 06/26/21 to 08/07/21  See note below for Objective Data and Assessment of Progress/Goals.       Encounter Date: 08/07/2021   PT End of Session - 08/07/21 1236     Visit Number 10    Date for PT Re-Evaluation 08/07/21    Authorization Type medicare - KX now    Progress Note Due on Visit 10    PT Start Time 1230    PT Stop Time 1315    PT Time Calculation (min) 45 min    Activity Tolerance Patient tolerated treatment well    Behavior During Therapy [facility] for tasks assessed/performed             Past Medical History:  Diagnosis Date   Arrhythmia    History of SVT with documented PVC'S and  PAC'S  12/08/12 Nuc stress test normal LV EF 74%  Event Monitor  12/01/12-01/03/13   Atrial flutter (HCC)    Celiac disease    treated by Dr. [name]   GERD (gastroesophageal reflux disease)    Intervertebral disc stenosis of neural canal of cervical region    Irregular heart beat 11/30/12   ECHO-EF 60-65%   Osteoporosis    PMR (polymyalgia rheumatica) (HCC)    Dr. Marijean Bravo; pt states she was diagnosed 10-15 years ago, not treated at this time or any issues that she is aware of.   Scoliosis    Scoliosis    Sleep apnea 10/02/11 [facility]   Sleep study AHI -total sleep 10.3/hr  64.0/ hr during REM sleep.RDI 22.8/hr during total sleep 64.0/hr during REM sleep The lowest O2 sat during Non-REM and REM sleep was 86% and 88% respectively. 04/08/12 CPAP/BIPAP titration study [facility]    Past Surgical History:  Procedure Laterality Date   APPENDECTOMY     ruptured at age 36 and had surgery   CARDIAC  CATHETERIZATION  01/27/06   cataract surgery  2015   Dr. Herbert Deaner; March & April 2015    There were no vitals filed for this visit.   Subjective Assessment - 08/07/21 1234     Subjective I am hurting more than usual today along Lt side. I continue to have some skin irritation on my back.    Pertinent History scoliosis    Limitations Lifting;House hold activities;Sitting    How long can you sit comfortably? 5-10 min    How long can you stand comfortably? standing is better than sitting    How long can you walk comfortably? limping while walking    Patient Stated Goals help relax the muscles, learn more to [place] and not to dos to guide activity    Currently in Pain? Yes    Pain Score 8     Pain Location Rib cage    Pain Orientation Left    Pain Descriptors / Indicators Tightness;Aching    Pain Type Chronic pain    Pain Onset More than a month ago    Pain Frequency Constant                [facility] PT Assessment - 08/07/21 0001       Assessment   Medical  Diagnosis M41.9 (ICD-10-CM) - Scoliosis, unspecified    Referring Provider (PT) Lawerance Cruel, MD    Onset Date/Surgical Date --   many years   Hand Dominance Right    Next MD Visit as needed    Prior Therapy yes at this facility      Observation/Other Assessments   Scoliosis signif scoliosis present with Lt thoracolumbar compression and kyphosis      Posture/Postural Control   Posture/Postural Control Postural limitations    Postural Limitations Rounded Shoulders;Forward head;Increased thoracic kyphosis;Left pelvic obliquity      AROM   Overall AROM Comments trunk ROM limited 50-75% for flexion, ext and SB secondary to rigid scoliosis, thoracic rotation [facility]    Cervical Flexion 65    Cervical Extension 55    Cervical - Right Side Bend 25    Cervical - Left Side Bend 30    Cervical - Right Rotation 50    Cervical - Left Rotation 45      PROM   Overall PROM Comments limited hip ER Lt>Rt      Strength   Overall  Strength Comments scapular stabilizers 4-4+/5, LEs 4/5      Flexibility   Hamstrings improved, WNL    Quadratus Lumborum limited on Lt 75%   adaptive shortening     Palpation   Palpation comment diffuse tenderness with associated muscle tension: bil upper traps, Rt intrascapular region, Lt QL and obliques                           [facility] Adult PT Treatment/Exercise - 08/07/21 0001       Exercises   Exercises Shoulder;Lumbar;Knee/Hip      Shoulder Exercises: Power Tower   Row 20 reps    Row Limitations 10lb, standing    Other Power [employer] treasurer pulldown 25lb x 15 reps, standing, VC to slow down eccentric phase      Manual Therapy   Manual Therapy Soft tissue mobilization;Myofascial release    Manual therapy comments seated in chair    Soft tissue mobilization bil upper traps, levator and cervical paraspinals, thoracic paraspinals, obliques, Lt QL, Lt lat, bil pectorals    Myofascial Release pectoral fascia bil, obliques on Lt                       PT Short Term Goals - 08/07/21 1323       PT SHORT TERM GOAL #1   Title Pt will be ind and compliant with initial HEP at least 3x/week.    Baseline doing aspects of HEP and walking daily    Status Achieved      PT SHORT TERM GOAL #2   Title -      PT SHORT TERM GOAL #3   Title -               PT Long Term Goals - 08/07/21 1323       PT LONG TERM GOAL #1   Title Pt will improve LE flexbility to [facility] to reduce undue strain on back.    Status Achieved      PT LONG TERM GOAL #2   Title Pt will improve scapular and UE strength to at least 4+/5 for improved postural support and functional use of bil UEs    Baseline 4-4+/5    Status Achieved      PT LONG TERM GOAL #3   Title Pt will  report compliance with HEP at least 4 times a week to improve strength and mobility    Baseline progressing resistance for postural strength in clinic    Status On-going      PT LONG TERM GOAL #4    Title Pt will report reduction of pain by at least 25% most days of the week with taper of PT to 1x/week    Baseline -    Status New    Target Date 10/02/21                   Plan - 08/07/21 1318     Clinical Impression Statement Pt has made significant improvements in neck ROM in all planes.  She reports several days of relief after PT visits.  She continues to exercise daily with walks around her neighborhood, stretches and postural strengthening.  She was able to increase resistance on both lat pulldown and standing row today with good control and tolerance.  Pt has chronic pain and limited trunk mobility secondary to severe thoracolumbar scoliosis.  She will benefit from continued PT with a taper down to 1x/week to continue manual therapy and guided progression of strengthening to promote ongoing ind with daily activities with less pain.    Personal Factors and Comorbidities Age;Time since onset of injury/illness/exacerbation    Examination-Activity Limitations Bend;Sit;Sleep;Lift;Carry;Squat    Examination-Participation Restrictions Estate agent;Shop;Meal Prep    Stability/Clinical Decision Making Stable/Uncomplicated    Rehab Potential Good    PT Frequency 1x / week    PT Duration 8 weeks    PT Treatment/Interventions ADLs/Self Care Home Management;Moist Heat;Functional mobility training;Therapeutic exercise;Therapeutic activities;Patient/family education;Manual techniques;Passive range of motion;Energy conservation    PT Next Visit Plan NuStep, mat based stretching and strength, STM Lt trunk and bil neck    PT Home Exercise Plan Access Code: [email or web address]    Consulted and Agree with Plan of Care Patient             Patient will benefit from skilled therapeutic intervention in order to improve the following deficits and impairments:  Decreased range of motion, Increased fascial restricitons, Increased muscle spasms, Decreased activity tolerance, Pain,  Hypomobility, Impaired flexibility, Improper body mechanics, Postural dysfunction, Decreased strength, Decreased mobility  Visit Diagnosis: Other idiopathic scoliosis, thoracolumbar region - Plan: PT plan of care cert/re-cert  Muscle weakness (generalized) - Plan: PT plan of care cert/re-cert  Abnormal posture - Plan: PT plan of care cert/re-cert  Muscle spasm of back - Plan: PT plan of care cert/re-cert  Cervicalgia - Plan: PT plan of care cert/re-cert  Chronic bilateral low back pain without sciatica - Plan: PT plan of care cert/re-cert     Problem List Patient Active Problem List   Diagnosis Date Noted   Deviated septum 05/08/2020   Mass of subcutaneous tissue 05/02/2020   Vertigo 06/16/2019   Pulmonary hypertension, unspecified ([place]) 04/14/2019   Ischemic colitis ([place]) 04/14/2019   Migraine with aura and without status migrainosus, not intractable 11/08/2018   Degeneration of lumbar intervertebral disc 10/14/2018   Hoarseness of voice 03/04/2018   Abdominal pain 07/01/2017   Diverticulitis, colon    Metabolic acidosis, increased anion gap    Constipation 04/14/2017   Lumbar hernia 04/14/2017   Presbycusis of both ears 01/10/2017   Tinnitus aurium, bilateral 01/10/2017   Gastroesophageal reflux disease 08/20/2016   Hemoptysis 08/20/2016   Obstructive sleep apnea of adult 08/20/2016   Rhinitis, chronic 08/20/2016   Throat pain in adult 08/20/2016   Fatigue 12/23/2015  Sciatica of right side 10/06/2015   History of migraine headaches 10/06/2015   Frequent PVCs 12/28/2013   Premature atrial contractions 12/28/2013   PSVT (paroxysmal supraventricular tachycardia) ([place]) 12/28/2013   Heart palpitations 07/13/2013   Sleep apnea 04/11/2013   Scoliosis 04/11/2013   Atrial flutter with rapid ventricular response ([place]) 11/29/2012   Chest pain, atypical 11/29/2012   Fibromyalgia syndrome 11/29/2012   Chronic steroid use 11/29/2012    Baruch Merl, PT 08/07/21 1:30  PM   [facility] @ [address] [place], [place], [address] Phone: [phone]   Fax:  [phone]  Name: JESSIE COWHER MRN: [id number] Date of Birth: [birth year]

## 2021-08-12 ENCOUNTER — Telehealth: Payer: Self-pay | Admitting: Family Medicine

## 2021-08-12 NOTE — Telephone Encounter (Signed)
Patient called to see if Janet Nguyen could take her as a new patient. She states she has heard good things about him and would appreciate if she could be taken on as a new patient as she feels she currently is not getting the care she needs. I did let patient know multiple times that he currently is not accepting new patients, but she wanted to know if there was any way to ask him personally.    Good callback number is [phone]     Please advise

## 2021-08-13 NOTE — Telephone Encounter (Signed)
Noted! Lm for caller to return call.

## 2021-08-13 NOTE — Telephone Encounter (Signed)
Pt is aware cory not accepting new patients

## 2021-08-13 NOTE — Telephone Encounter (Signed)
Noted  

## 2021-08-13 NOTE — Telephone Encounter (Signed)
Please advise 

## 2021-08-15 ENCOUNTER — Other Ambulatory Visit: Payer: Self-pay

## 2021-08-15 ENCOUNTER — Encounter: Payer: Self-pay | Admitting: Physical Therapy

## 2021-08-15 ENCOUNTER — Ambulatory Visit: Payer: Medicare Other | Admitting: Physical Therapy

## 2021-08-15 DIAGNOSIS — M542 Cervicalgia: Secondary | ICD-10-CM | POA: Diagnosis not present

## 2021-08-15 DIAGNOSIS — R293 Abnormal posture: Secondary | ICD-10-CM | POA: Diagnosis not present

## 2021-08-15 DIAGNOSIS — M62830 Muscle spasm of back: Secondary | ICD-10-CM | POA: Diagnosis not present

## 2021-08-15 DIAGNOSIS — M6281 Muscle weakness (generalized): Secondary | ICD-10-CM

## 2021-08-15 DIAGNOSIS — M545 Low back pain, unspecified: Secondary | ICD-10-CM

## 2021-08-15 DIAGNOSIS — M4125 Other idiopathic scoliosis, thoracolumbar region: Secondary | ICD-10-CM

## 2021-08-15 NOTE — Therapy (Signed)
[facility] @ [address] [place], [place], [address] Phone: [phone]   Fax:  [phone]  Physical Therapy Treatment  Patient Details  Name: Janet Nguyen MRN: [id number] Date of Birth: 07-Oct-1931 Referring Provider (PT): Lawerance Cruel, MD   Encounter Date: 08/15/2021   PT End of Session - 08/15/21 1153     Visit Number 11    Date for PT Re-Evaluation 08/07/21    Authorization Type medicare - KX now    Progress Note Due on Visit 20    PT Start Time 1147    PT Stop Time 1228    PT Time Calculation (min) 41 min    Activity Tolerance Patient tolerated treatment well    Behavior During Therapy [facility] for tasks assessed/performed             Past Medical History:  Diagnosis Date   Arrhythmia    History of SVT with documented PVC'S and  PAC'S  12/08/12 Nuc stress test normal LV EF 74%  Event Monitor  12/01/12-01/03/13   Atrial flutter ([place])    Celiac disease    treated by Dr. [name]   GERD (gastroesophageal reflux disease)    Intervertebral disc stenosis of neural canal of cervical region    Irregular heart beat 11/30/12   ECHO-EF 60-65%   Osteoporosis    PMR (polymyalgia rheumatica) (HCC)    Dr. Marijean Bravo; pt states she was diagnosed 10-15 years ago, not treated at this time or any issues that she is aware of.   Scoliosis    Scoliosis    Sleep apnea 10/02/11 [facility]   Sleep study AHI -total sleep 10.3/hr  64.0/ hr during REM sleep.RDI 22.8/hr during total sleep 64.0/hr during REM sleep The lowest O2 sat during Non-REM and REM sleep was 86% and 88% respectively. 04/08/12 CPAP/BIPAP titration study [facility]    Past Surgical History:  Procedure Laterality Date   APPENDECTOMY     ruptured at age 53 and had surgery   CARDIAC CATHETERIZATION  01/27/06   cataract surgery  2015   Dr. Herbert Deaner; March & April 2015    There were no vitals filed for this visit.    Subjective Assessment - 08/15/21 1152     Subjective I am fatigued today and am having stomach issues to that is contributing.  I don't know that I can do much exercise today.    Pertinent History scoliosis    Limitations Lifting;House hold activities;Sitting    How long can you sit comfortably? 5-10 min    How long can you stand comfortably? standing is better than sitting    How long can you walk comfortably? limping while walking    Patient Stated Goals help relax the muscles, learn more to [place] and not to dos to guide activity    Currently in Pain? Yes    Pain Score 5     Pain Location Other (Comment)   overall fatigue   Pain Descriptors / Indicators Tiring    Pain Type Chronic pain    Pain Onset More than a month ago    Pain Frequency Constant    Aggravating Factors  activity    Pain Relieving Factors rest, heat, stretching                               [facility] Adult PT Treatment/Exercise - 08/15/21 0001  Exercises   Exercises Shoulder;Lumbar;Knee/Hip;Other Exercises    Other Exercises  attempted to lay supine for stretches but Pt did not feel well laying down today, stayed in chair for manual PT      Lumbar Exercises: Aerobic   Nustep L2 x 4', PT present to monitor fatigue      Manual Therapy   Manual Therapy Soft tissue mobilization    Manual therapy comments seated in chair, Pt didn't feel well laying down today    Soft tissue mobilization bil upper traps, levator and cervical paraspinals, thoracic paraspinals, obliques, Lt QL, Lt lat, bil pectorals                       PT Short Term Goals - 08/07/21 1323       PT SHORT TERM GOAL #1   Title Pt will be ind and compliant with initial HEP at least 3x/week.    Baseline doing aspects of HEP and walking daily    Status Achieved      PT SHORT TERM GOAL #2   Title -      PT SHORT TERM GOAL #3   Title -               PT Long Term Goals - 08/07/21 1323       PT LONG TERM  GOAL #1   Title Pt will improve LE flexbility to [facility] to reduce undue strain on back.    Status Achieved      PT LONG TERM GOAL #2   Title Pt will improve scapular and UE strength to at least 4+/5 for improved postural support and functional use of bil UEs    Baseline 4-4+/5    Status Achieved      PT LONG TERM GOAL #3   Title Pt will report compliance with HEP at least 4 times a week to improve strength and mobility    Baseline progressing resistance for postural strength in clinic    Status On-going      PT LONG TERM GOAL #4   Title Pt will report reduction of pain by at least 25% most days of the week with taper of PT to 1x/week    Baseline -    Status New    Target Date 10/02/21                   Plan - 08/15/21 1228     Clinical Impression Statement Pt arrived fatigued and declined much ther ex due to general malaise.  She attempted supine stretches but didn't feel well laying supine.  PT used chair positioning for STM today to address ongoing adaptive shortening and tension related to severe scoliosis.  Pt continues to report several days of reduced pain and improved activity tolerance following PT sessions.  Continue along POC.    PT Frequency 1x / week    PT Duration 8 weeks    PT Treatment/Interventions ADLs/Self Care Home Management;Moist Heat;Functional mobility training;Therapeutic exercise;Therapeutic activities;Patient/family education;Manual techniques;Passive range of motion;Energy conservation    PT Next Visit Plan NuStep, mat based stretching and strength, STM Lt trunk and bil neck    PT Home Exercise Plan Access Code: [email or web address]    Consulted and Agree with Plan of Care Patient             Patient will benefit from skilled therapeutic intervention in order to improve the following deficits and impairments:     Visit Diagnosis: Other idiopathic  scoliosis, thoracolumbar region  Abnormal posture  Cervicalgia  Chronic bilateral low back pain without  sciatica  Muscle weakness (generalized)     Problem List Patient Active Problem List   Diagnosis Date Noted   Deviated septum 05/08/2020   Mass of subcutaneous tissue 05/02/2020   Vertigo 06/16/2019   Pulmonary hypertension, unspecified ([place]) 04/14/2019   Ischemic colitis ([place]) 04/14/2019   Migraine with aura and without status migrainosus, not intractable 11/08/2018   Degeneration of lumbar intervertebral disc 10/14/2018   Hoarseness of voice 03/04/2018   Abdominal pain 07/01/2017   Diverticulitis, colon    Metabolic acidosis, increased anion gap    Constipation 04/14/2017   Lumbar hernia 04/14/2017   Presbycusis of both ears 01/10/2017   Tinnitus aurium, bilateral 01/10/2017   Gastroesophageal reflux disease 08/20/2016   Hemoptysis 08/20/2016   Obstructive sleep apnea of adult 08/20/2016   Rhinitis, chronic 08/20/2016   Throat pain in adult 08/20/2016   Fatigue 12/23/2015   Sciatica of right side 10/06/2015   History of migraine headaches 10/06/2015   Frequent PVCs 12/28/2013   Premature atrial contractions 12/28/2013   PSVT (paroxysmal supraventricular tachycardia) ([place]) 12/28/2013   Heart palpitations 07/13/2013   Sleep apnea 04/11/2013   Scoliosis 04/11/2013   Atrial flutter with rapid ventricular response ([place]) 11/29/2012   Chest pain, atypical 11/29/2012   Fibromyalgia syndrome 11/29/2012   Chronic steroid use 11/29/2012    Baruch Merl, PT 08/15/21 12:31 PM   [facility] @ [address] [place], [place], [address] Phone: [phone]   Fax:  [phone]  Name: Janet Nguyen MRN: [id number] Date of Birth: Aug 10, 1932

## 2021-08-28 ENCOUNTER — Ambulatory Visit: Payer: Medicare Other | Admitting: Physical Therapy

## 2021-08-28 ENCOUNTER — Encounter: Payer: Self-pay | Admitting: Physical Therapy

## 2021-08-28 ENCOUNTER — Other Ambulatory Visit: Payer: Self-pay

## 2021-08-28 DIAGNOSIS — R293 Abnormal posture: Secondary | ICD-10-CM

## 2021-08-28 DIAGNOSIS — M545 Low back pain, unspecified: Secondary | ICD-10-CM | POA: Diagnosis not present

## 2021-08-28 DIAGNOSIS — M62830 Muscle spasm of back: Secondary | ICD-10-CM | POA: Diagnosis not present

## 2021-08-28 DIAGNOSIS — M6281 Muscle weakness (generalized): Secondary | ICD-10-CM | POA: Diagnosis not present

## 2021-08-28 DIAGNOSIS — M542 Cervicalgia: Secondary | ICD-10-CM

## 2021-08-28 DIAGNOSIS — M4125 Other idiopathic scoliosis, thoracolumbar region: Secondary | ICD-10-CM | POA: Diagnosis not present

## 2021-08-28 NOTE — Therapy (Signed)
[place] @ [address] [place], [place], [address] Phone: [phone]   Fax:  [phone]  Physical Therapy Treatment  Patient Details  Name: Janet Nguyen MRN: [id number] Date of Birth: May 29, 1932 Referring Provider (PT): Lawerance Cruel, MD   Encounter Date: 08/28/2021   PT End of Session - 08/28/21 1319     Visit Number 12    Date for PT Re-Evaluation 10/02/21    Authorization Type medicare - KX now    Progress Note Due on Visit 20    PT Start Time 1102    PT Stop Time 1145    PT Time Calculation (min) 43 min    Activity Tolerance Patient tolerated treatment well    Behavior During Therapy [facility] for tasks assessed/performed             Past Medical History:  Diagnosis Date   Arrhythmia    History of SVT with documented PVC'S and  PAC'S  12/08/12 Nuc stress test normal LV EF 74%  Event Monitor  12/01/12-01/03/13   Atrial flutter ([place])    Celiac disease    treated by Dr. [name]   GERD (gastroesophageal reflux disease)    Intervertebral disc stenosis of neural canal of cervical region    Irregular heart beat 11/30/12   ECHO-EF 60-65%   Osteoporosis    PMR (polymyalgia rheumatica) (HCC)    Dr. Marijean Bravo; pt states she was diagnosed 10-15 years ago, not treated at this time or any issues that she is aware of.   Scoliosis    Scoliosis    Sleep apnea 10/02/11 [facility]   Sleep study AHI -total sleep 10.3/hr  64.0/ hr during REM sleep.RDI 22.8/hr during total sleep 64.0/hr during REM sleep The lowest O2 sat during Non-REM and REM sleep was 86% and 88% respectively. 04/08/12 CPAP/BIPAP titration study [facility]    Past Surgical History:  Procedure Laterality Date   APPENDECTOMY     ruptured at age 25 and had surgery   CARDIAC CATHETERIZATION  01/27/06   cataract surgery  2015   Dr. Herbert Deaner; March & April 2015    There were no vitals filed for this visit.    Subjective Assessment - 08/28/21 1106     Subjective my whole body is telling me it is raining.  I am exhausted.  Everything I do hurts my abdomen on the Lt side.    Pertinent History scoliosis    Limitations Lifting;House hold activities;Sitting    How long can you sit comfortably? 5-10 min    How long can you stand comfortably? standing is better than sitting    How long can you walk comfortably? limping while walking    Patient Stated Goals help relax the muscles, learn more to [place] and not to dos to guide activity    Currently in Pain? Yes    Pain Score 7     Pain Location Back    Pain Orientation Left    Pain Type Chronic pain    Pain Radiating Towards abdominal region, ribcage    Pain Onset More than a month ago    Pain Frequency Constant    Aggravating Factors  everything    Pain Relieving Factors rest, heat, stretch                               [facility] Adult PT Treatment/Exercise -  08/28/21 0001       Exercises   Exercises Shoulder;Lumbar;Knee/Hip      Lumbar Exercises: Aerobic   Nustep L2x 5' PT present to discuss status      Shoulder Exercises: Standing   Other Standing Exercises doorway stretch bil and single arm reach up/over 2x10 sec each      Shoulder Exercises: ROM/Strengthening   Wall Pushups 10 reps      Shoulder Exercises: Power [employer] 15 reps    Row Limitations standing, 10lb      Manual Therapy   Manual Therapy Soft tissue mobilization;Myofascial release    Soft tissue mobilization Lt obliques, thoracic paraspinals, bil pectorals, bil upper traps   in Rt SL, upper quadrants from chair   Myofascial Release Lt ribcage/obliques                       PT Short Term Goals - 08/07/21 1323       PT SHORT TERM GOAL #1   Title Pt will be ind and compliant with initial HEP at least 3x/week.    Baseline doing aspects of HEP and walking daily    Status Achieved      PT SHORT TERM GOAL #2   Title -      PT SHORT TERM  GOAL #3   Title -               PT Long Term Goals - 08/07/21 1323       PT LONG TERM GOAL #1   Title Pt will improve LE flexbility to [facility] to reduce undue strain on back.    Status Achieved      PT LONG TERM GOAL #2   Title Pt will improve scapular and UE strength to at least 4+/5 for improved postural support and functional use of bil UEs    Baseline 4-4+/5    Status Achieved      PT LONG TERM GOAL #3   Title Pt will report compliance with HEP at least 4 times a week to improve strength and mobility    Baseline progressing resistance for postural strength in clinic    Status On-going      PT LONG TERM GOAL #4   Title Pt will report reduction of pain by at least 25% most days of the week with taper of PT to 1x/week    Baseline -    Status New    Target Date 10/02/21                   Plan - 08/28/21 1322     Clinical Impression Statement Pt has had difficulty eating due to compressed spine on Lt side limiting stomach capacity.  She has ongoing chronic pain related to severe scoliosis.  PT introduced wall push up today during which Pt felt good stretching into Lt abdominal wall which may be getting some fascial mobility with this new exercise.  She continues to stretch and do postural ther ex daily along with neighborhood walks when weather allows.  She benefits from [facility] and fascial release for adaptive shortening along Lt trunk, bil pectorals and bil upper quadrants.  Continue along POC.    PT Frequency 1x / week    PT Duration 8 weeks    PT Next Visit Plan NuStep, wall push ups, doorway stretches, STM for elongation    PT Home Exercise Plan Access Code: [email or web address]    [email or web address] and Agree with  Plan of Care Patient             Patient will benefit from skilled therapeutic intervention in order to improve the following deficits and impairments:     Visit Diagnosis: Other idiopathic scoliosis, thoracolumbar region  Abnormal posture  Cervicalgia  Chronic  bilateral low back pain without sciatica     Problem List Patient Active Problem List   Diagnosis Date Noted   Deviated septum 05/08/2020   Mass of subcutaneous tissue 05/02/2020   Vertigo 06/16/2019   Pulmonary hypertension, unspecified ([place]) 04/14/2019   Ischemic colitis ([place]) 04/14/2019   Migraine with aura and without status migrainosus, not intractable 11/08/2018   Degeneration of lumbar intervertebral disc 10/14/2018   Hoarseness of voice 03/04/2018   Abdominal pain 07/01/2017   Diverticulitis, colon    Metabolic acidosis, increased anion gap    Constipation 04/14/2017   Lumbar hernia 04/14/2017   Presbycusis of both ears 01/10/2017   Tinnitus aurium, bilateral 01/10/2017   Gastroesophageal reflux disease 08/20/2016   Hemoptysis 08/20/2016   Obstructive sleep apnea of adult 08/20/2016   Rhinitis, chronic 08/20/2016   Throat pain in adult 08/20/2016   Fatigue 12/23/2015   Sciatica of right side 10/06/2015   History of migraine headaches 10/06/2015   Frequent PVCs 12/28/2013   Premature atrial contractions 12/28/2013   PSVT (paroxysmal supraventricular tachycardia) ([place]) 12/28/2013   Heart palpitations 07/13/2013   Sleep apnea 04/11/2013   Scoliosis 04/11/2013   Atrial flutter with rapid ventricular response ([place]) 11/29/2012   Chest pain, atypical 11/29/2012   Fibromyalgia syndrome 11/29/2012   Chronic steroid use 11/29/2012    Baruch Merl, PT 08/28/21 1:26 PM   [place] @ [place], [place], [address] Phone: [phone]   Fax:  [phone]  Name: MARVIE BREVIK MRN: [id number] Date of Birth: 1932-01-27

## 2021-09-04 ENCOUNTER — Encounter: Payer: Self-pay | Admitting: Physical Therapy

## 2021-09-04 ENCOUNTER — Other Ambulatory Visit: Payer: Self-pay

## 2021-09-04 ENCOUNTER — Ambulatory Visit: Payer: Medicare Other | Attending: Family Medicine | Admitting: Physical Therapy

## 2021-09-04 DIAGNOSIS — M545 Low back pain, unspecified: Secondary | ICD-10-CM | POA: Diagnosis not present

## 2021-09-04 DIAGNOSIS — M6281 Muscle weakness (generalized): Secondary | ICD-10-CM | POA: Diagnosis not present

## 2021-09-04 DIAGNOSIS — M542 Cervicalgia: Secondary | ICD-10-CM | POA: Insufficient documentation

## 2021-09-04 DIAGNOSIS — G8929 Other chronic pain: Secondary | ICD-10-CM | POA: Insufficient documentation

## 2021-09-04 DIAGNOSIS — R293 Abnormal posture: Secondary | ICD-10-CM | POA: Diagnosis not present

## 2021-09-04 DIAGNOSIS — M4125 Other idiopathic scoliosis, thoracolumbar region: Secondary | ICD-10-CM | POA: Insufficient documentation

## 2021-09-04 NOTE — Therapy (Signed)
[place] @ [address] [place], [place], [address] Phone: [phone]   Fax:  [phone]  Physical Therapy Treatment  Patient Details  Name: Janet Nguyen MRN: [id number] Date of Birth: Sep 01, 1932 Referring Provider (PT): Lawerance Cruel, MD   Encounter Date: 09/04/2021   PT End of Session - 09/04/21 1057     Visit Number 13    Date for PT Re-Evaluation 10/02/21    Authorization Type medicare - KX now    Progress Note Due on Visit 20    PT Start Time 1100    PT Stop Time 1140    PT Time Calculation (min) 40 min    Activity Tolerance Patient tolerated treatment well    Behavior During Therapy [facility] for tasks assessed/performed             Past Medical History:  Diagnosis Date   Arrhythmia    History of SVT with documented PVC'S and  PAC'S  12/08/12 Nuc stress test normal LV EF 74%  Event Monitor  12/01/12-01/03/13   Atrial flutter ([place])    Celiac disease    treated by Dr. [name]   GERD (gastroesophageal reflux disease)    Intervertebral disc stenosis of neural canal of cervical region    Irregular heart beat 11/30/12   ECHO-EF 60-65%   Osteoporosis    PMR (polymyalgia rheumatica) (HCC)    Dr. Marijean Bravo; pt states she was diagnosed 10-15 years ago, not treated at this time or any issues that she is aware of.   Scoliosis    Scoliosis    Sleep apnea 10/02/11 [facility]   Sleep study AHI -total sleep 10.3/hr  64.0/ hr during REM sleep.RDI 22.8/hr during total sleep 64.0/hr during REM sleep The lowest O2 sat during Non-REM and REM sleep was 86% and 88% respectively. 04/08/12 CPAP/BIPAP titration study [facility]    Past Surgical History:  Procedure Laterality Date   APPENDECTOMY     ruptured at age 21 and had surgery   CARDIAC CATHETERIZATION  01/27/06   cataract surgery  2015   Dr. Herbert Deaner; March & April 2015    There were no vitals filed for this visit.    Subjective Assessment - 09/04/21 1056     Subjective My Rt hip hurts in the groin.  The weather doesn't help.    Pertinent History scoliosis    Limitations Lifting;House hold activities;Sitting    How long can you stand comfortably? standing is better than sitting    How long can you walk comfortably? limping while walking    Patient Stated Goals help relax the muscles, learn more to [place] and not to dos to guide activity    Currently in Pain? Yes    Pain Score 8     Pain Location Hip    Pain Orientation Right    Pain Descriptors / Indicators Sharp    Pain Type Chronic pain    Pain Radiating Towards Lt abdominal, ribcage Lt    Pain Onset More than a month ago    Pain Frequency Constant    Pain Relieving Factors rest, heat, stretch, walk, not sit too long                               [facility] Adult PT Treatment/Exercise - 09/04/21 0001       Exercises   Exercises Knee/Hip;Lumbar;Shoulder  Lumbar Exercises: Aerobic   Nustep L2x 5' PT present to discuss status      Knee/Hip Exercises: Stretches   Hip Flexor Stretch Right;30 seconds    Hip Flexor Stretch Limitations Lt foot on 2nd step, rock in/out x 30"      Shoulder Exercises: Standing   External Rotation Strengthening;Both;Theraband;20 reps    Theraband Level (Shoulder External Rotation) Level 2 (Red)    Other Standing Exercises wall push ups 2x10      Shoulder Exercises: Power [employer] 20 reps    Row Limitations standing, 10lb      Manual Therapy   Soft tissue mobilization Lt obliques, QL, thoracic paraspinals, bil pectorals, bil upper traps, bil levator, bil cervical paraspinals   in Rt SL, upper quadrants from chair                      PT Short Term Goals - 08/07/21 1323       PT SHORT TERM GOAL #1   Title Pt will be ind and compliant with initial HEP at least 3x/week.    Baseline doing aspects of HEP and walking daily    Status Achieved      PT SHORT TERM GOAL #2   Title  -      PT SHORT TERM GOAL #3   Title -               PT Long Term Goals - 08/07/21 1323       PT LONG TERM GOAL #1   Title Pt will improve LE flexbility to [facility] to reduce undue strain on back.    Status Achieved      PT LONG TERM GOAL #2   Title Pt will improve scapular and UE strength to at least 4+/5 for improved postural support and functional use of bil UEs    Baseline 4-4+/5    Status Achieved      PT LONG TERM GOAL #3   Title Pt will report compliance with HEP at least 4 times a week to improve strength and mobility    Baseline progressing resistance for postural strength in clinic    Status On-going      PT LONG TERM GOAL #4   Title Pt will report reduction of pain by at least 25% most days of the week with taper of PT to 1x/week    Baseline -    Status New    Target Date 10/02/21                   Plan - 09/04/21 1112     Clinical Impression Statement Pt has had increased experience of Rt hip pain into groin.  She continues to have compressed Lt trunk catching of ribcage on ilium secondary to severe scoliosis.  She demos improving control with eccentric phase of all ther ex today and reports a "good stretch" in abdomen with wall push ups which she has been compliant with at home.  PT added Rt hip flexor stretch with Lt foot up on 2nd stair which Pt reported targeted her pain which was reduced following the stretch.  Pt continues to benefit from skilled manual therapy especially along Lt trunk to elongate adaptive shortening of obliques, QL and paraspinals.  Pt reports she is able to be more active and eat more meals/feel successful with digestion for several days following PT sessions.  She will likely decline in multiple systems without ongoing PT, which  we have tapered to 1x/week.    PT Frequency 1x / week    PT Duration 8 weeks    PT Treatment/Interventions ADLs/Self Care Home Management;Moist Heat;Functional mobility training;Therapeutic  exercise;Therapeutic activities;Patient/family education;Manual techniques;Passive range of motion;Energy conservation    PT Next Visit Plan NuStep, wall push ups, row, shoulder ER bil,  doorway stretches, STM for elongation    PT Home Exercise Plan Access Code: [email or web address]    Consulted and Agree with Plan of Care Patient             Patient will benefit from skilled therapeutic intervention in order to improve the following deficits and impairments:     Visit Diagnosis: Other idiopathic scoliosis, thoracolumbar region  Abnormal posture  Cervicalgia  Chronic bilateral low back pain without sciatica     Problem List Patient Active Problem List   Diagnosis Date Noted   Deviated septum 05/08/2020   Mass of subcutaneous tissue 05/02/2020   Vertigo 06/16/2019   Pulmonary hypertension, unspecified ([place]) 04/14/2019   Ischemic colitis ([place]) 04/14/2019   Migraine with aura and without status migrainosus, not intractable 11/08/2018   Degeneration of lumbar intervertebral disc 10/14/2018   Hoarseness of voice 03/04/2018   Abdominal pain 07/01/2017   Diverticulitis, colon    Metabolic acidosis, increased anion gap    Constipation 04/14/2017   Lumbar hernia 04/14/2017   Presbycusis of both ears 01/10/2017   Tinnitus aurium, bilateral 01/10/2017   Gastroesophageal reflux disease 08/20/2016   Hemoptysis 08/20/2016   Obstructive sleep apnea of adult 08/20/2016   Rhinitis, chronic 08/20/2016   Throat pain in adult 08/20/2016   Fatigue 12/23/2015   Sciatica of right side 10/06/2015   History of migraine headaches 10/06/2015   Frequent PVCs 12/28/2013   Premature atrial contractions 12/28/2013   PSVT (paroxysmal supraventricular tachycardia) ([place]) 12/28/2013   Heart palpitations 07/13/2013   Sleep apnea 04/11/2013   Scoliosis 04/11/2013   Atrial flutter with rapid ventricular response ([place]) 11/29/2012   Chest pain, atypical 11/29/2012   Fibromyalgia syndrome 11/29/2012   Chronic  steroid use 11/29/2012    Baruch Merl, PT 09/04/21 12:52 PM   [place] @ [place], [place], [address] Phone: [phone]   Fax:  [phone]  Name: Janet Nguyen MRN: [id number] Date of Birth: 11-06-1931

## 2021-09-11 ENCOUNTER — Other Ambulatory Visit: Payer: Self-pay

## 2021-09-11 ENCOUNTER — Ambulatory Visit: Payer: Medicare Other | Admitting: Physical Therapy

## 2021-09-11 ENCOUNTER — Encounter: Payer: Self-pay | Admitting: Physical Therapy

## 2021-09-11 DIAGNOSIS — M6281 Muscle weakness (generalized): Secondary | ICD-10-CM | POA: Diagnosis not present

## 2021-09-11 DIAGNOSIS — M4125 Other idiopathic scoliosis, thoracolumbar region: Secondary | ICD-10-CM

## 2021-09-11 DIAGNOSIS — M542 Cervicalgia: Secondary | ICD-10-CM

## 2021-09-11 DIAGNOSIS — M545 Low back pain, unspecified: Secondary | ICD-10-CM | POA: Diagnosis not present

## 2021-09-11 DIAGNOSIS — G8929 Other chronic pain: Secondary | ICD-10-CM

## 2021-09-11 DIAGNOSIS — R293 Abnormal posture: Secondary | ICD-10-CM | POA: Diagnosis not present

## 2021-09-11 NOTE — Therapy (Signed)
[place] @ [address] [place], [place], [address] Phone: [phone]   Fax:  [phone]  Physical Therapy Treatment  Patient Details  Name: Janet Nguyen MRN: [id number] Date of Birth: 1932-05-13 Referring Provider (PT): Lawerance Cruel, MD   Encounter Date: 09/11/2021   PT End of Session - 09/11/21 1144     Visit Number 14    Date for PT Re-Evaluation 10/02/21    Authorization Type medicare - KX now    Progress Note Due on Visit 20    PT Start Time 1100    PT Stop Time 1145    PT Time Calculation (min) 45 min    Activity Tolerance Patient tolerated treatment well    Behavior During Therapy [facility] for tasks assessed/performed             Past Medical History:  Diagnosis Date   Arrhythmia    History of SVT with documented PVC'S and  PAC'S  12/08/12 Nuc stress test normal LV EF 74%  Event Monitor  12/01/12-01/03/13   Atrial flutter ([place])    Celiac disease    treated by Dr. [name]   GERD (gastroesophageal reflux disease)    Intervertebral disc stenosis of neural canal of cervical region    Irregular heart beat 11/30/12   ECHO-EF 60-65%   Osteoporosis    PMR (polymyalgia rheumatica) (HCC)    Dr. Marijean Bravo; pt states she was diagnosed 10-15 years ago, not treated at this time or any issues that she is aware of.   Scoliosis    Scoliosis    Sleep apnea 10/02/11 [facility]   Sleep study AHI -total sleep 10.3/hr  64.0/ hr during REM sleep.RDI 22.8/hr during total sleep 64.0/hr during REM sleep The lowest O2 sat during Non-REM and REM sleep was 86% and 88% respectively. 04/08/12 CPAP/BIPAP titration study [facility]    Past Surgical History:  Procedure Laterality Date   APPENDECTOMY     ruptured at age 9 and had surgery   CARDIAC CATHETERIZATION  01/27/06   cataract surgery  2015   Dr. Herbert Deaner; March & April 2015    There were no vitals filed for this visit.    Subjective Assessment - 09/11/21 1113     Subjective I am feeling better than usual today.  A 5/10.  I need to learn how to help open up my belly out of my ribs.    Pertinent History scoliosis    Limitations Lifting;House hold activities;Sitting    How long can you sit comfortably? 5-10 min    How long can you stand comfortably? standing is better than sitting    How long can you walk comfortably? limping while walking    Patient Stated Goals help relax the muscles, learn more to [place] and not to dos to guide activity    Currently in Pain? Yes    Pain Score 5     Pain Location Back    Pain Orientation Left                               [facility] Adult PT Treatment/Exercise - 09/11/21 0001       Self-Care   Self-Care Other Self-Care Comments    Other Self-Care Comments  diaphragmatic breathing practice with VC and TC in Rt SL      Exercises   Exercises Lumbar;Knee/Hip;Shoulder  Lumbar Exercises: Aerobic   Nustep L2x 5' PT present to discuss status      Shoulder Exercises: ROM/Strengthening   Wall Pushups 10 reps      Shoulder Exercises: Stretch   Other Shoulder Stretches Lt trunk and QL, ITB in doorway x 30"      Shoulder Exercises: Power [employer] 20 reps    Row Limitations standing, 10lb    Other Power [employer] Exercises standing lat pulldown x 15, 15lb      Manual Therapy   Soft tissue mobilization Lt obliques, QL, thoracic paraspinals, bil pectorals, bil upper traps, bil levator, bil cervical paraspinals   in Rt SL, upper quadrants from chair                      PT Short Term Goals - 08/07/21 1323       PT SHORT TERM GOAL #1   Title Pt will be ind and compliant with initial HEP at least 3x/week.    Baseline doing aspects of HEP and walking daily    Status Achieved      PT SHORT TERM GOAL #2   Title -      PT SHORT TERM GOAL #3   Title -               PT Long Term Goals - 08/07/21 1323       PT LONG TERM GOAL #1    Title Pt will improve LE flexbility to [facility] to reduce undue strain on back.    Status Achieved      PT LONG TERM GOAL #2   Title Pt will improve scapular and UE strength to at least 4+/5 for improved postural support and functional use of bil UEs    Baseline 4-4+/5    Status Achieved      PT LONG TERM GOAL #3   Title Pt will report compliance with HEP at least 4 times a week to improve strength and mobility    Baseline progressing resistance for postural strength in clinic    Status On-going      PT LONG TERM GOAL #4   Title Pt will report reduction of pain by at least 25% most days of the week with taper of PT to 1x/week    Baseline -    Status New    Target Date 10/02/21                   Plan - 09/11/21 1326     Clinical Impression Statement PT introduced Pt to diaphragmatic breathing today in an effort to help Pt manage compression of abdominal region.  Pt arrived with improved pain level 5/10 compared to previous visits and PT noted reduced muscle tension today.  Pt benefits from and is getting good carry over between sessions with use of postural strength and manual techniques.  She continues to have chronic pain related to severe scoliosis but is managing well with PT.    PT Frequency 1x / week    PT Duration 8 weeks    PT Treatment/Interventions ADLs/Self Care Home Management;Moist Heat;Functional mobility training;Therapeutic exercise;Therapeutic activities;Patient/family education;Manual techniques;Passive range of motion;Energy conservation    PT Next Visit Plan NuStep, wall push ups, row, shoulder ER bil,  doorway stretches, STM for elongation    PT Home Exercise Plan Access Code: [email or web address]    Consulted and Agree with Plan of Care Patient  Patient will benefit from skilled therapeutic intervention in order to improve the following deficits and impairments:     Visit Diagnosis: Other idiopathic scoliosis, thoracolumbar region  Abnormal  posture  Chronic bilateral low back pain without sciatica  Muscle weakness (generalized)  Cervicalgia     Problem List Patient Active Problem List   Diagnosis Date Noted   Deviated septum 05/08/2020   Mass of subcutaneous tissue 05/02/2020   Vertigo 06/16/2019   Pulmonary hypertension, unspecified ([place]) 04/14/2019   Ischemic colitis ([place]) 04/14/2019   Migraine with aura and without status migrainosus, not intractable 11/08/2018   Degeneration of lumbar intervertebral disc 10/14/2018   Hoarseness of voice 03/04/2018   Abdominal pain 07/01/2017   Diverticulitis, colon    Metabolic acidosis, increased anion gap    Constipation 04/14/2017   Lumbar hernia 04/14/2017   Presbycusis of both ears 01/10/2017   Tinnitus aurium, bilateral 01/10/2017   Gastroesophageal reflux disease 08/20/2016   Hemoptysis 08/20/2016   Obstructive sleep apnea of adult 08/20/2016   Rhinitis, chronic 08/20/2016   Throat pain in adult 08/20/2016   Fatigue 12/23/2015   Sciatica of right side 10/06/2015   History of migraine headaches 10/06/2015   Frequent PVCs 12/28/2013   Premature atrial contractions 12/28/2013   PSVT (paroxysmal supraventricular tachycardia) ([place]) 12/28/2013   Heart palpitations 07/13/2013   Sleep apnea 04/11/2013   Scoliosis 04/11/2013   Atrial flutter with rapid ventricular response ([place]) 11/29/2012   Chest pain, atypical 11/29/2012   Fibromyalgia syndrome 11/29/2012   Chronic steroid use 11/29/2012    Baruch Merl, PT 09/11/21 1:29 PM   [place] @ [address] [place], [place], [address] Phone: [phone]   Fax:  [phone]  Name: Janet Nguyen MRN: [id number] Date of Birth: Nov 12, 1931

## 2021-09-13 DIAGNOSIS — K219 Gastro-esophageal reflux disease without esophagitis: Secondary | ICD-10-CM | POA: Diagnosis not present

## 2021-09-13 DIAGNOSIS — F320 Major depressive disorder, single episode, mild: Secondary | ICD-10-CM | POA: Diagnosis not present

## 2021-09-13 DIAGNOSIS — I10 Essential (primary) hypertension: Secondary | ICD-10-CM | POA: Diagnosis not present

## 2021-09-13 DIAGNOSIS — M810 Age-related osteoporosis without current pathological fracture: Secondary | ICD-10-CM | POA: Diagnosis not present

## 2021-09-18 ENCOUNTER — Other Ambulatory Visit: Payer: Self-pay

## 2021-09-18 ENCOUNTER — Encounter: Payer: Self-pay | Admitting: Physical Therapy

## 2021-09-18 ENCOUNTER — Ambulatory Visit: Payer: Medicare Other | Admitting: Physical Therapy

## 2021-09-18 DIAGNOSIS — M545 Low back pain, unspecified: Secondary | ICD-10-CM | POA: Diagnosis not present

## 2021-09-18 DIAGNOSIS — M4125 Other idiopathic scoliosis, thoracolumbar region: Secondary | ICD-10-CM

## 2021-09-18 DIAGNOSIS — M6281 Muscle weakness (generalized): Secondary | ICD-10-CM

## 2021-09-18 DIAGNOSIS — R293 Abnormal posture: Secondary | ICD-10-CM

## 2021-09-18 DIAGNOSIS — G8929 Other chronic pain: Secondary | ICD-10-CM | POA: Diagnosis not present

## 2021-09-18 DIAGNOSIS — M542 Cervicalgia: Secondary | ICD-10-CM

## 2021-09-18 NOTE — Therapy (Signed)
[facility] @ [place], [place], [address] Phone: [phone]   Fax:  [phone]  Physical Therapy Treatment  Patient Details  Name: Janet Nguyen MRN: [id number] Date of Birth: 03-17-1932 Referring Provider (PT): Lawerance Cruel, MD   Encounter Date: 09/18/2021   PT End of Session - 09/18/21 1108     Visit Number 15    Date for PT Re-Evaluation 10/02/21    Authorization Type medicare - KX now    Progress Note Due on Visit 20    PT Start Time 1100    PT Stop Time 1142    PT Time Calculation (min) 42 min    Activity Tolerance Patient tolerated treatment well    Behavior During Therapy [facility] for tasks assessed/performed             Past Medical History:  Diagnosis Date   Arrhythmia    History of SVT with documented PVC'S and  PAC'S  12/08/12 Nuc stress test normal LV EF 74%  Event Monitor  12/01/12-01/03/13   Atrial flutter ([place])    Celiac disease    treated by Dr. [name]   GERD (gastroesophageal reflux disease)    Intervertebral disc stenosis of neural canal of cervical region    Irregular heart beat 11/30/12   ECHO-EF 60-65%   Osteoporosis    PMR (polymyalgia rheumatica) (HCC)    Dr. Marijean Bravo; pt states she was diagnosed 10-15 years ago, not treated at this time or any issues that she is aware of.   Scoliosis    Scoliosis    Sleep apnea 10/02/11 [facility]   Sleep study AHI -total sleep 10.3/hr  64.0/ hr during REM sleep.RDI 22.8/hr during total sleep 64.0/hr during REM sleep The lowest O2 sat during Non-REM and REM sleep was 86% and 88% respectively. 04/08/12 CPAP/BIPAP titration study [facility]    Past Surgical History:  Procedure Laterality Date   APPENDECTOMY     ruptured at age 60 and had surgery   CARDIAC CATHETERIZATION  01/27/06   cataract surgery  2015   Dr. Herbert Deaner; March & April 2015    There were no vitals filed for this visit.    Subjective Assessment - 09/18/21 1106     Subjective I feel average today - the holidays have many variables for me which throws me off.  I feel like my pain is up from psychological factors related to family issues.    Pertinent History scoliosis    Limitations Lifting;House hold activities;Sitting    How long can you sit comfortably? 5-10 min    How long can you stand comfortably? standing is better than sitting    How long can you walk comfortably? limping while walking    Currently in Pain? Yes    Pain Score 7     Pain Location Back    Pain Orientation Left    Pain Descriptors / Indicators Tightness;Jabbing    Pain Type Chronic pain    Pain Radiating Towards Lt abdominal region, ribcage Lt    Pain Onset More than a month ago    Pain Frequency Constant                               [facility] Adult PT Treatment/Exercise - 09/18/21 0001       Exercises   Exercises Neck;Shoulder;Knee/Hip;Lumbar      Knee/Hip Exercises:  Aerobic   Nustep L2 x 5' PT present to discuss symptoms      Shoulder Exercises: Standing   External Rotation Strengthening;Both;Theraband;15 reps    Theraband Level (Shoulder External Rotation) Level 3 (Green)      Shoulder Exercises: Stretch   Other Shoulder Stretches Lt trunk and QL, ITB in doorway x 30"      Shoulder Exercises: Power [employer] 20 reps    Row Limitations standing, 10lb    Other Power [employer] Exercises standing lat pulldown x 15, 15lb      Manual Therapy   Soft tissue mobilization Lt obliques, QL, thoracic paraspinals, bil pectorals, bil upper traps, bil levator, bil cervical paraspinals   in Rt SL, upper quadrants from chair                      PT Short Term Goals - 08/07/21 1323       PT SHORT TERM GOAL #1   Title Pt will be ind and compliant with initial HEP at least 3x/week.    Baseline doing aspects of HEP and walking daily    Status Achieved      PT SHORT TERM GOAL #2   Title -      PT SHORT  TERM GOAL #3   Title -               PT Long Term Goals - 08/07/21 1323       PT LONG TERM GOAL #1   Title Pt will improve LE flexbility to [facility] to reduce undue strain on back.    Status Achieved      PT LONG TERM GOAL #2   Title Pt will improve scapular and UE strength to at least 4+/5 for improved postural support and functional use of bil UEs    Baseline 4-4+/5    Status Achieved      PT LONG TERM GOAL #3   Title Pt will report compliance with HEP at least 4 times a week to improve strength and mobility    Baseline progressing resistance for postural strength in clinic    Status On-going      PT LONG TERM GOAL #4   Title Pt will report reduction of pain by at least 25% most days of the week with taper of PT to 1x/week    Baseline -    Status New    Target Date 10/02/21                   Plan - 09/18/21 1347     Clinical Impression Statement Pt reported higher pain level today but admitted she felt there was a more psychological component to her pain this week with the upcoming holidays and some family variables.  PT discussed how these variables do change the experience of pain.  PT noted ongoing carry over of reduced areas of tension and tightness along Lt trunk and bil shoulders/neck.  Pt demos improving control with all eccentric phases of ther ex with the power tower row and lat pulldown.  She demo'd good form with bil shoulder blue band ER in standing so PT advanced to this level of resistance.  Pt continues to experience chronic pain related to severe scoliosis but finds she is able to do more activity with improved pain management between PT sessions.  Continue along POC.    PT Frequency 1x / week    PT Duration 8 weeks  PT Treatment/Interventions ADLs/Self Care Home Management;Moist Heat;Functional mobility training;Therapeutic exercise;Therapeutic activities;Patient/family education;Manual techniques;Passive range of motion;Energy conservation    PT Next  Visit Plan NuStep, wall push ups, power tower row and lat pull, shoulder ER bil blue,  doorway stretches for ITB and Lt trunk, STM for elongation    PT Home Exercise Plan Access Code: [email or web address]    Consulted and Agree with Plan of Care Patient             Patient will benefit from skilled therapeutic intervention in order to improve the following deficits and impairments:     Visit Diagnosis: Other idiopathic scoliosis, thoracolumbar region  Abnormal posture  Chronic bilateral low back pain without sciatica  Muscle weakness (generalized)  Cervicalgia     Problem List Patient Active Problem List   Diagnosis Date Noted   Deviated septum 05/08/2020   Mass of subcutaneous tissue 05/02/2020   Vertigo 06/16/2019   Pulmonary hypertension, unspecified ([place]) 04/14/2019   Ischemic colitis ([place]) 04/14/2019   Migraine with aura and without status migrainosus, not intractable 11/08/2018   Degeneration of lumbar intervertebral disc 10/14/2018   Hoarseness of voice 03/04/2018   Abdominal pain 07/01/2017   Diverticulitis, colon    Metabolic acidosis, increased anion gap    Constipation 04/14/2017   Lumbar hernia 04/14/2017   Presbycusis of both ears 01/10/2017   Tinnitus aurium, bilateral 01/10/2017   Gastroesophageal reflux disease 08/20/2016   Hemoptysis 08/20/2016   Obstructive sleep apnea of adult 08/20/2016   Rhinitis, chronic 08/20/2016   Throat pain in adult 08/20/2016   Fatigue 12/23/2015   Sciatica of right side 10/06/2015   History of migraine headaches 10/06/2015   Frequent PVCs 12/28/2013   Premature atrial contractions 12/28/2013   PSVT (paroxysmal supraventricular tachycardia) ([place]) 12/28/2013   Heart palpitations 07/13/2013   Sleep apnea 04/11/2013   Scoliosis 04/11/2013   Atrial flutter with rapid ventricular response ([place]) 11/29/2012   Chest pain, atypical 11/29/2012   Fibromyalgia syndrome 11/29/2012   Chronic steroid use 11/29/2012    Baruch Merl,  PT 09/18/21 1:51 PM   [facility] @ [place], [place], [address] Phone: [phone]   Fax:  [phone]  Name: Janet Nguyen MRN: [id number] Date of Birth: October 13, 1931

## 2021-09-25 ENCOUNTER — Ambulatory Visit: Payer: Medicare Other

## 2021-09-25 ENCOUNTER — Telehealth: Payer: Self-pay | Admitting: Cardiovascular Disease

## 2021-09-25 ENCOUNTER — Other Ambulatory Visit: Payer: Self-pay

## 2021-09-25 DIAGNOSIS — M6281 Muscle weakness (generalized): Secondary | ICD-10-CM | POA: Diagnosis not present

## 2021-09-25 DIAGNOSIS — G8929 Other chronic pain: Secondary | ICD-10-CM | POA: Diagnosis not present

## 2021-09-25 DIAGNOSIS — M545 Low back pain, unspecified: Secondary | ICD-10-CM | POA: Diagnosis not present

## 2021-09-25 DIAGNOSIS — Z20822 Contact with and (suspected) exposure to covid-19: Secondary | ICD-10-CM | POA: Diagnosis not present

## 2021-09-25 DIAGNOSIS — R293 Abnormal posture: Secondary | ICD-10-CM

## 2021-09-25 DIAGNOSIS — M542 Cervicalgia: Secondary | ICD-10-CM | POA: Diagnosis not present

## 2021-09-25 DIAGNOSIS — M4125 Other idiopathic scoliosis, thoracolumbar region: Secondary | ICD-10-CM

## 2021-09-25 NOTE — Telephone Encounter (Signed)
Patient called to schedule appt with Dr. [name], she didn't want to wait until may when he first has an available.  She wants to see him before then.  She wants to see him in the next couple of weeks.

## 2021-09-25 NOTE — Therapy (Addendum)
[facility] @ [place], [place], [address] Phone: [phone]   Fax:  [phone]  Physical Therapy Treatment  Patient Details  Name: Janet Nguyen MRN: [id number] Date of Birth: Sep 18, 1932 Referring Provider (PT): Lawerance Cruel, MD   Encounter Date: 09/25/2021   PT End of Session - 09/25/21 1228     Visit Number 16    Date for PT Re-Evaluation 10/02/21    Authorization Type medicare - KX now    Progress Note Due on Visit 20    PT Start Time 1146    PT Stop Time 1228    PT Time Calculation (min) 42 min    Activity Tolerance Patient tolerated treatment well    Behavior During Therapy [facility] for tasks assessed/performed             Past Medical History:  Diagnosis Date   Arrhythmia    History of SVT with documented PVC'S and  PAC'S  12/08/12 Nuc stress test normal LV EF 74%  Event Monitor  12/01/12-01/03/13   Atrial flutter ([place])    Celiac disease    treated by Dr. [name]   GERD (gastroesophageal reflux disease)    Intervertebral disc stenosis of neural canal of cervical region    Irregular heart beat 11/30/12   ECHO-EF 60-65%   Osteoporosis    PMR (polymyalgia rheumatica) (HCC)    Dr. Marijean Bravo; pt states she was diagnosed 10-15 years ago, not treated at this time or any issues that she is aware of.   Scoliosis    Scoliosis    Sleep apnea 10/02/11 [facility]   Sleep study AHI -total sleep 10.3/hr  64.0/ hr during REM sleep.RDI 22.8/hr during total sleep 64.0/hr during REM sleep The lowest O2 sat during Non-REM and REM sleep was 86% and 88% respectively. 04/08/12 CPAP/BIPAP titration study [facility]    Past Surgical History:  Procedure Laterality Date   APPENDECTOMY     ruptured at age 47 and had surgery   CARDIAC CATHETERIZATION  01/27/06   cataract surgery  2015   Dr. Herbert Deaner; March & April 2015    There were no vitals filed for this visit.    Subjective Assessment - 09/25/21 1151     Subjective I am moving slow today.    Currently in Pain? Yes                               [place] [facility] [place] Adult PT Treatment/Exercise - 09/25/21 0001       Lumbar Exercises: Aerobic   Nustep L2x 5' PT present to discuss status      Shoulder Exercises: Standing   External Rotation Strengthening;Both;Theraband;15 reps    Theraband Level (Shoulder External Rotation) Level 4 (Blue)      Shoulder Exercises: Stretch   Other Shoulder Stretches Lt trunk and QL, ITB in doorway x 30"      Shoulder Exercises: Power [employer] 20 reps    Row Limitations standing, 10lb    Other Power Tower Exercises standing lat pulldown x 10, 20 lb      Manual Therapy   Soft tissue mobilization Lt obliques, QL, thoracic paraspinals, bil pectorals, bil upper traps, bil levator, bil cervical paraspinals   in Rt SL, upper quadrants from chair  PT Short Term Goals - 08/07/21 1323   ° °  ° PT SHORT TERM GOAL #1  ° Title Pt will be ind and compliant with initial HEP at least 3x/week.   ° Baseline doing aspects of HEP and walking daily   ° Status Achieved   °  ° PT SHORT TERM GOAL #2  ° Title -   °  ° PT SHORT TERM GOAL #3  ° Title -   ° °  °  ° °  ° ° ° ° PT Long Term Goals - 08/07/21 1323   ° °  ° PT LONG TERM GOAL #1  ° Title Pt will improve LE flexbility to WFL to reduce undue strain on back.   ° Status Achieved   °  ° PT LONG TERM GOAL #2  ° Title Pt will improve scapular and UE strength to at least 4+/5 for improved postural support and functional use of bil UEs   ° Baseline 4-4+/5   ° Status Achieved   °  ° PT LONG TERM GOAL #3  ° Title Pt will report compliance with HEP at least 4 times a week to improve strength and mobility   ° Baseline progressing resistance for postural strength in clinic   ° Status On-going   °  ° PT LONG TERM GOAL #4  ° Title Pt will report reduction of pain by at least 25% most days of the week with taper  of PT to 1x/week   ° Baseline -   ° Status New   ° Target Date 10/02/21   ° °  °  ° °  ° ° ° ° ° ° ° ° Plan - 09/25/21 1230   ° ° Clinical Impression Statement Pt is overall feeling better today as compared to last week.   Pt demos improving control with all eccentric phases of ther ex with the power tower row and lat pulldown.  Pt is doing well with advancement of band exercises to blue.   Pt continues to experience chronic pain related to severe scoliosis but finds she is able to do more activity with improved pain management between PT sessions.  ERO next session with probable D/C.   ° PT Frequency 1x / week   ° PT Duration 8 weeks   ° PT Treatment/Interventions ADLs/Self Care Home Management;Moist Heat;Functional mobility training;Therapeutic exercise;Therapeutic activities;Patient/family education;Manual techniques;Passive range of motion;Energy conservation   ° PT Next Visit Plan ERO next   ° PT Home Exercise Plan Access Code: [email or web address]   ° Consulted and Agree with Plan of Care Patient   ° °  °  ° °  ° ° °Patient will benefit from skilled therapeutic intervention in order to improve the following deficits and impairments:  Decreased range of motion, Increased fascial restricitons, Increased muscle spasms, Decreased activity tolerance, Pain, Hypomobility, Impaired flexibility, Improper body mechanics, Postural dysfunction, Decreased strength, Decreased mobility ° °Visit Diagnosis: °Other idiopathic scoliosis, thoracolumbar region ° °Abnormal posture ° °Chronic bilateral low back pain without sciatica ° °Muscle weakness (generalized) ° ° ° ° °Problem List °Patient Active Problem List  ° Diagnosis Date Noted  ° Deviated septum 05/08/2020  ° Mass of subcutaneous tissue 05/02/2020  ° Vertigo 06/16/2019  ° Pulmonary hypertension, unspecified (HCC) 04/14/2019  ° Ischemic colitis (HCC) 04/14/2019  ° Migraine with aura and without status migrainosus, not intractable 11/08/2018  ° Degeneration of lumbar intervertebral  disc 10/14/2018  ° Hoarseness of voice 03/04/2018  ° Abdominal pain 07/01/2017  °   Diverticulitis, colon   ° Metabolic acidosis, increased anion gap   ° Constipation 04/14/2017  ° Lumbar hernia 04/14/2017  ° Presbycusis of both ears 01/10/2017  ° Tinnitus aurium, bilateral 01/10/2017  ° Gastroesophageal reflux disease 08/20/2016  ° Hemoptysis 08/20/2016  ° Obstructive sleep apnea of adult 08/20/2016  ° Rhinitis, chronic 08/20/2016  ° Throat pain in adult 08/20/2016  ° Fatigue 12/23/2015  ° Sciatica of right side 10/06/2015  ° History of migraine headaches 10/06/2015  ° Frequent PVCs 12/28/2013  ° Premature atrial contractions 12/28/2013  ° PSVT (paroxysmal supraventricular tachycardia) (HCC) 12/28/2013  ° Heart palpitations 07/13/2013  ° Sleep apnea 04/11/2013  ° Scoliosis 04/11/2013  ° Atrial flutter with rapid ventricular response (HCC) 11/29/2012  ° Chest pain, atypical 11/29/2012  ° Fibromyalgia syndrome 11/29/2012  ° Chronic steroid use 11/29/2012  ° ° °Kelly Takacs, PT °09/25/21 12:30 PM  ° ° °PHYSICAL THERAPY DISCHARGE SUMMARY ° °Visits from Start of Care: 16 ° °Current functional level related to goals / functional outcomes: °See above.  Pt had to cancel her final assessment.  She will likely benefit from another round of PT in a few months to help her manage symptoms of scoliosis.   °  °Remaining deficits: °See above °  °Education / Equipment: °HEP ° °Patient agrees to discharge. Patient goals were partially met. Patient is being discharged due to maximized rehab potential.  ° °Johanna Beuhring, PT °10/02/21 11:35 AM ° °[place] °[facility] [place] °[address] °[place], , [address] °Phone: [phone]   Fax:  [phone] ° °Name: Lexxie M Brasil °MRN: [id number] °Date of Birth: 03/27/1932 ° ° ° °

## 2021-09-25 NOTE — Telephone Encounter (Signed)
Returned call to pt she states that she needs to be seen this week with Dr [name] because she has not slept in 2 years and she "is tired of it!" Since no changes since last appt I have scheduled appt for annual visit. She would like this forwarded to Dr [name] to review and "fit her in somewhere". Informed pt that Dr [name] is off on vacation and will review upon he s return, verbalized understanding.

## 2021-09-29 DIAGNOSIS — Z20822 Contact with and (suspected) exposure to covid-19: Secondary | ICD-10-CM | POA: Diagnosis not present

## 2021-10-01 NOTE — Telephone Encounter (Signed)
I contacted patient, had an opening for next week.  Patient took this appointment to discuss sleeping issues.  Patient verbalized understanding.

## 2021-10-02 ENCOUNTER — Ambulatory Visit: Payer: Medicare Other | Attending: Family Medicine | Admitting: Physical Therapy

## 2021-10-02 DIAGNOSIS — G8929 Other chronic pain: Secondary | ICD-10-CM | POA: Insufficient documentation

## 2021-10-02 DIAGNOSIS — R293 Abnormal posture: Secondary | ICD-10-CM | POA: Insufficient documentation

## 2021-10-02 DIAGNOSIS — M6281 Muscle weakness (generalized): Secondary | ICD-10-CM | POA: Insufficient documentation

## 2021-10-02 DIAGNOSIS — M4125 Other idiopathic scoliosis, thoracolumbar region: Secondary | ICD-10-CM | POA: Insufficient documentation

## 2021-10-02 DIAGNOSIS — M545 Low back pain, unspecified: Secondary | ICD-10-CM | POA: Insufficient documentation

## 2021-10-02 DIAGNOSIS — M542 Cervicalgia: Secondary | ICD-10-CM | POA: Insufficient documentation

## 2021-10-08 ENCOUNTER — Ambulatory Visit (HOSPITAL_BASED_OUTPATIENT_CLINIC_OR_DEPARTMENT_OTHER)
Admission: RE | Admit: 2021-10-08 | Discharge: 2021-10-08 | Disposition: A | Discharge status: Home / Self Care | Payer: Medicare Other | Source: Ambulatory Visit | Attending: Family Medicine | Admitting: Family Medicine

## 2021-10-08 ENCOUNTER — Other Ambulatory Visit (HOSPITAL_BASED_OUTPATIENT_CLINIC_OR_DEPARTMENT_OTHER): Payer: Self-pay | Admitting: Family Medicine

## 2021-10-08 ENCOUNTER — Other Ambulatory Visit: Payer: Self-pay

## 2021-10-08 DIAGNOSIS — R0781 Pleurodynia: Secondary | ICD-10-CM

## 2021-10-08 DIAGNOSIS — R062 Wheezing: Secondary | ICD-10-CM | POA: Diagnosis not present

## 2021-10-08 DIAGNOSIS — M419 Scoliosis, unspecified: Secondary | ICD-10-CM | POA: Insufficient documentation

## 2021-10-08 DIAGNOSIS — R0602 Shortness of breath: Secondary | ICD-10-CM | POA: Diagnosis not present

## 2021-10-08 DIAGNOSIS — R0789 Other chest pain: Secondary | ICD-10-CM | POA: Diagnosis not present

## 2021-10-08 DIAGNOSIS — R051 Acute cough: Secondary | ICD-10-CM

## 2021-10-08 DIAGNOSIS — G479 Sleep disorder, unspecified: Secondary | ICD-10-CM | POA: Diagnosis not present

## 2021-10-09 ENCOUNTER — Encounter (HOSPITAL_BASED_OUTPATIENT_CLINIC_OR_DEPARTMENT_OTHER): Payer: Self-pay

## 2021-10-09 ENCOUNTER — Other Ambulatory Visit (HOSPITAL_BASED_OUTPATIENT_CLINIC_OR_DEPARTMENT_OTHER): Payer: Self-pay | Admitting: Family Medicine

## 2021-10-09 ENCOUNTER — Ambulatory Visit (HOSPITAL_BASED_OUTPATIENT_CLINIC_OR_DEPARTMENT_OTHER)
Admission: RE | Admit: 2021-10-09 | Discharge: 2021-10-09 | Disposition: A | Discharge status: Home / Self Care | Payer: Medicare Other | Source: Ambulatory Visit | Attending: Family Medicine | Admitting: Family Medicine

## 2021-10-09 DIAGNOSIS — R0602 Shortness of breath: Secondary | ICD-10-CM | POA: Insufficient documentation

## 2021-10-09 DIAGNOSIS — J810 Acute pulmonary edema: Secondary | ICD-10-CM

## 2021-10-09 DIAGNOSIS — R918 Other nonspecific abnormal finding of lung field: Secondary | ICD-10-CM | POA: Diagnosis not present

## 2021-10-09 DIAGNOSIS — J479 Bronchiectasis, uncomplicated: Secondary | ICD-10-CM | POA: Diagnosis not present

## 2021-10-09 DIAGNOSIS — I700 Atherosclerosis of aorta: Secondary | ICD-10-CM | POA: Diagnosis not present

## 2021-10-09 DIAGNOSIS — R911 Solitary pulmonary nodule: Secondary | ICD-10-CM | POA: Diagnosis not present

## 2021-10-09 LAB — POCT I-STAT CREATININE: Creatinine, Ser: 1.1 mg/dL — ABNORMAL HIGH (ref 0.44–1.00)

## 2021-10-09 MED ORDER — IOHEXOL 350 MG/ML SOLN
50.0000 mL | Freq: Once | INTRAVENOUS | Status: AC | PRN
  Administered 2021-10-09: 50 mL via INTRAVENOUS

## 2021-10-10 ENCOUNTER — Ambulatory Visit (INDEPENDENT_AMBULATORY_CARE_PROVIDER_SITE_OTHER): Payer: Medicare Other | Admitting: Cardiovascular Disease

## 2021-10-10 ENCOUNTER — Other Ambulatory Visit: Payer: Self-pay

## 2021-10-10 ENCOUNTER — Encounter: Payer: Self-pay | Admitting: Cardiovascular Disease

## 2021-10-10 DIAGNOSIS — I872 Venous insufficiency (chronic) (peripheral): Secondary | ICD-10-CM | POA: Diagnosis not present

## 2021-10-10 DIAGNOSIS — G4733 Obstructive sleep apnea (adult) (pediatric): Secondary | ICD-10-CM

## 2021-10-10 DIAGNOSIS — J479 Bronchiectasis, uncomplicated: Secondary | ICD-10-CM | POA: Diagnosis not present

## 2021-10-10 DIAGNOSIS — R011 Cardiac murmur, unspecified: Secondary | ICD-10-CM

## 2021-10-10 DIAGNOSIS — R918 Other nonspecific abnormal finding of lung field: Secondary | ICD-10-CM | POA: Diagnosis not present

## 2021-10-10 DIAGNOSIS — R0609 Other forms of dyspnea: Secondary | ICD-10-CM

## 2021-10-10 DIAGNOSIS — I10 Essential (primary) hypertension: Secondary | ICD-10-CM

## 2021-10-10 DIAGNOSIS — I272 Pulmonary hypertension, unspecified: Secondary | ICD-10-CM

## 2021-10-10 DIAGNOSIS — M4125 Other idiopathic scoliosis, thoracolumbar region: Secondary | ICD-10-CM | POA: Diagnosis not present

## 2021-10-10 DIAGNOSIS — I471 Supraventricular tachycardia: Secondary | ICD-10-CM

## 2021-10-10 DIAGNOSIS — J849 Interstitial pulmonary disease, unspecified: Secondary | ICD-10-CM

## 2021-10-10 NOTE — Progress Notes (Addendum)
50,000 patient ID: Janet Nguyen, female   DOB: 30-Aug-1932, 88 y.o.   MRN: [id number]     Primary  M.D.: Dr. Mardene Sayer  HPI: Janet Nguyen is a 88 y.o. female who presents for a 8 month followup cardiology evaluation.   [name] has a history of documented SVT and also has PACs and PVCs  treated with beta blocker therapy. In April 2014 I further titrated her beta blocker therapy after cardiac event monitor revealed several bursts of recurrent SVT up to 177 beats per minute in March.  In July after she had had 2 episodes of chest fluttering which each lasted over 30 minutes which he did take metoprolol tartrate with relief I recommended further titration of her Toprol to 75 mg in the morning and 50 mg at night and if necessary she could further titrate this to 75 twice a day.  She has a history of obstructive sleep apnea but despite multiple attempts at CPAP utilization she has not been able to tolerate this. She was referred to Dr. [name] and has a customized dental appliance with mandibular advancement with improvement in some of her symptomatology. Due concern that her teeth may be moving in more recently she has has not been using customized appliance daily but has been using her old non-customized mouthguard.  At times shen has awakened abruptly from a dream with her heart pounding and a sensation of hot flashes, gasping for breath.   She also states that her scoliosis is getting worse. She does have left-sided musculoskeletal type chest pain due to her spine angulation.  Beause of her significant scoliosis, she feels she must sleep on her back.   She has a history of GERD for which she has been taking Nexium.  She presents for evaluation.  I had scheduled her for an overnight oximetry to see if she is a candidate for supplemental oxygen at nighttime since she refused to use CPAP and only very rarely uses her customized mouthpiece.  She does wear a mouthguard to reduce  bruxism.  Oximetry study was performed overnight on February 23/24.  Her mean oxygen saturation was 93.12%.  She spent 12 minutes and 16 seconds with O2 sat duration below 88% with the lowest O2 sat duration of 80%. I tried to set her up for supplemental oxygen at bedtime.  However, she has been denied for this on multiple occasions by her insurance/Medicare.  She feels that she is sleeping better.  She can only sleep in her back due to scoliosis.  Her left side is concave; her right side is convex.  She has had issues with labile blood pressure. She has had issues with lower back discomfort and sciatica leading to emergency room evaluation in November 2016.  She saw Dr. [name] for neurosurgical evaluation. She has migraine headaches. She  Recently saw Dr. [name] for these migraine headaches.  She has been on Toprol-XL 75 mg twice a day for her palpitations and presently denies any awareness an extra heartbeats. She takes Xanax on an as-needed basis for anxiety.  She has been taking Nexium 20, no grams every other day for GERD. She states her scoliosis is getting worse.  Has been experiencing more fatigue.  She also notes some occasional left hand numbness.  In a mouth guard but no ureteral longer uses her mandibular advancement device and not tolerate CPAP therapy for her obstructive sleep apnea.    She had experienced left-sided chest discomfort which most likely  was related to her significant scoliosis the potential neuropathy causing intermittent left arm and hand numbness.  I slightly reduced her Toprol-XL which she had been taking 150 mg daily and a 75 twice a day regimen to 75 and milligrams in the morning and 50 mg with ultimate plan to decrease this to 50 twice a day.  She states when she reduce this, she began to notice more optical migraines and resume taking the higher dose Toprol at 75 twice a day with improvement.  She has seen Dr. [name] for her paresthesias.  She admits to significant anxiety.   She had requested zolpidem for sleep initiation and maintenance.  In the past, we had tried 6.25 slow-release version to help with sleep maintenance, but due to cost issues preferred the 5 mg sleep initiation dose.  She experiences optical migraine headaches.  She was being seen by neurologist, Dr. Melton Alar  She continues to have issues with her scoliosis causing discomfort in her neck and arm due to her distortion.  He tells me she underwent endoscopy and colonoscopy as well as banding of hemorrhoids.  She had a benign polyp removed.  She has issues with spastic colon.  Her sleep is better with zolpiden 5 mg.  She saw Kerin Ransom on 11/13/2016.  She had noticed that her hair was "thinning" and was concerned about this being the result of Toprol.  He suggested possibly decreasing Toprol but she preferred not until she had seen me.  Since that time, she continues to experience some optical migraines.  She has noticed some leg left neck discomfort.  She denies any patches of hair loss.  She denies differential arm weakness.  She continues to have difficulty with her scoliosis with hip pain and rib cage discomfort.   When I saw her in March 2018, there was a blood pressure differential of 118/78 in the left arm and 140/80 in the right arm.  She subsequently underwent upper extremity Doppler evaluation on 12/25/2016.  She was noted have mild heterogeneous plaque with stable 1-39% bilateral internal carotid stenoses.  She had normal subclavian arteries bilaterally.  There are patent vertebral arteries with antegrade flow.  She did not have any significant blood pressure differential and had triphasic waveforms in both her right and biphasic in her left brachial artery.  Left blood pressure was 8 mm higher than the right brachial pressure.  At times, she continues to experience some vague chest wall symptoms, which most likely related to her posture.  She denies significant shortness of breath.  She has to sleep on  her back because of her scoliosis.  Uses a chin strap to prevent oral breathing.  She is not on CPAP.   In June 2019 she had concerns about some of her medications possibly causing hair loss or memory loss.  She admits to having dry eyes.  She continues to have difficulty from her scolWsaw her in November 2019 at which time she,  felt well from a cardiac standphk and apparently had a new oral appliance made.  She used this initially but then started to notice some teeth discomfort.  Apparently this again was treated by Dr.e still admits to being tired.  She has issues with her abdomen being tight which she believes is related to her scoliosis.  Her GERD is controlled with pantoprazole.    She was seen by Dr. Mardene Sayer for her primary care.  She had developed a fungal infection of her toenail.  He had  given her to Jasper fine 20 mg.  She has not started this and was hesitant to do this due to potential interaction with metoprolol.  She also was evaluated by Dr. Brett Fairy.  According to Ms. First she did not evaluate her ophthalmic migraines.  However she was planning to do a possible home study to reassess her sleep apnea.  In the past she did not tolerate any CPAP therapy and although initially tolerated customized oral appliance this seemed to cause difficulty with movement of her teeth.  She has been using the CALM app on her smart phone to help with relaxation and improve sleep and is chronically dependent on low-dose zolpidem prescribed by Dr. Harrington Challenger.  She denies chest pain.  Her palpitations are well controlled with her current dose of metoprolol 75 mils twice a day and she can to be on low-dose amlodipine 2.5 mg.    Since I saw her in June 2020, she underwent the home study by Dr. Theodoro Clock was done after several studies with no data.  According to Dr. Edwena Felty note, AHI was 26.5 which was moderate and during REM sleep AHI rose to 34.9.  Snoring was not recorded.  Apparently there was discussion of  possible Financial planner.  Ms. Treaster is not interested.  I saw her in October 2020 at which time she had continued issues with migraines and some vertigo for which he takes the Epley maneuver.  She continued to have discomfort related to her scoliosis with muscle discomfort on her chest.  She believes she is waking up during dreaming and not sleeping well.  Her blood pressure has increased.  At her prior evaluation, I had instituted low-dose amlodipine at 2.5 mg.  With her blood pressure elevated during that evaluation I further titrated this to 5 mg I recommended she continue taking Toprol XL 75 mg twice a day.  She did not have any episodes of palpitations or recurrent SVT.  I saw her in January 2021. She had seen  Dr. Jaynee Eagles of neurology.  She continued to experience some muscular neck aches which may be related to her scoliosis but do not sound ischemic in etiology.  She states she has some varicose veins in the right lower extremity and complains of trivial ankle swelling.  She denies palpitations.    I saw her in April 2021.  Over the previous several months she had felt well from a cardiovascular standpoint.  She continues to have issues with her scoliosis causing back issues as well as some left-sided discomfort along her rib cage.   She has sleep apnea and remotely did not tolerate CPAP or an oral appliance  She continues to be on amlodipine 5 mg daily, metoprolol succinate 75 mg twice a day both for blood pressure and her palpitations. GERD is fairly well controlled on pantoprazole. She continues to be on Librax which she states does offer improvement and she takes alprazolam on a as needed basis.  I saw her in October 2021 and since her prior evaluation she had undergone neurology evaluation by Dr. Tomi Likens and has had migraine issues with aura since young adulthood.  An MRI of the brain in 2017 showed mild chronic small vessel ischemic changes otherwise was unremarkable.  She had developed  swelling in her leg right greater than left and has varicose veins.  She underwent lower extremity venous study which revealed no DVT but there was evidence for venous reflux in the right saphenofemoral junction, right greater saphenous vein in the  thigh and right greater saphenous vein in the calf.  She has used compression stockings in the past.  There was no plan for intervention.  She has been undergoing physical therapy.  There is no change in her intermittent rib discomfort from her scoliosis.  She denies palpitations.    I last saw her in May 2022.  Over the prior 6 months she continued to have issues with her rib cage and hip bone discomfort contributed by her scoliosis.  She has had issues with varicose veins.  She has to sleep on her side.  She was recently evaluated by Dr. Earlie Server for elevated monocytes who felt she was stable hematologically.  She denies any angina.  She is unaware of palpitations and continues to be on Toprol all succinate 75 mg twice a day.  She is also on amlodipine 5 mg for hypertension.  She has a prescription for alprazolam and rarely if ever takes this for anxiety.    Since her prior evaluation, she continues to have issues resulting from her scoliosis.  She was recently evaluated at [facility] and a chest CT revealed scattered areas of focal bronchiectasis and clustered nodules in the lower lungs, most pronounced in the right middle lobe and lingula.  There also was a part solid nodule of the right lower lobe measuring 7.5 mm in mean diameter with 4 mm solid component.  A follow-up noncontrast CT was recommended in 3 to 6 months.  She admits to increasing shortness of breath with activity.  She denies anginal type symptoms.  She presents for evaluation.  Past Medical History:  Diagnosis Date   Arrhythmia    History of SVT with documented PVC'S and  PAC'S  12/08/12 Nuc stress test normal LV EF 74%  Event Monitor  12/01/12-01/03/13   Atrial flutter ([place])     Celiac disease    treated by Dr. [name]   GERD (gastroesophageal reflux disease)    Intervertebral disc stenosis of neural canal of cervical region    Irregular heart beat 11/30/12   ECHO-EF 60-65%   Osteoporosis    PMR (polymyalgia rheumatica) (HCC)    Dr. Marijean Bravo; pt states she was diagnosed 10-15 years ago, not treated at this time or any issues that she is aware of.   Scoliosis    Scoliosis    Sleep apnea 10/02/11 [facility]   Sleep study AHI -total sleep 10.3/hr  64.0/ hr during REM sleep.RDI 22.8/hr during total sleep 64.0/hr during REM sleep The lowest O2 sat during Non-REM and REM sleep was 86% and 88% respectively. 04/08/12 CPAP/BIPAP titration study [facility]    Past Surgical History:  Procedure Laterality Date   APPENDECTOMY     ruptured at age 54 and had surgery   CARDIAC CATHETERIZATION  01/27/06   cataract surgery  2015   Dr. Herbert Deaner; March & April 2015    Allergies  Allergen Reactions   Gluten Meal     Unknown   Naproxen     Stomach upset   Codeine Nausea Only    Current Outpatient Medications  Medication Sig Dispense Refill   ALPRAZolam (XANAX) 0.5 MG tablet Take 0.5 mg by mouth as needed.     amLODipine (NORVASC) 5 MG tablet TAKE ONE TABLET BY MOUTH DAILY 90 tablet 3   Biotin 1000 MCG tablet 1 tablet     Cholecalciferol (VITAMIN D3) 50 MCG (2000 UT) capsule 1 capsule     clidinium-chlordiazePOXIDE (LIBRAX) 5-2.5 MG  capsule Take 1 capsule by mouth as needed.     EQ ACETAMINOPHEN PO Take 500 mg by mouth as needed (pain).     FLUTICASONE PROPIONATE, NASAL, NA Place into the nose daily as needed (rhinitis).     metoprolol succinate (TOPROL-XL) 50 MG 24 hr tablet TAKE ONE AND ONE HALF TABLETS BY MOUTH TWO TIMES A DAY 270 tablet 3   Multiple Vitamins-Minerals (PRESERVISION AREDS 2 PO) Take 1 tablet by mouth every other day.      Omega 3 1000 MG CAPS 1 capsule     Phenylephrine-APAP-guaiFENesin (EQ SINUS CONGESTION & PAIN PO)  Take by mouth as needed (for sinus pain). Contains acetaminophen 325 mg and Phenylephrine 5 mg     traMADol (ULTRAM) 50 MG tablet Take 50 mg by mouth 2 (two) times daily as needed.     UNABLE TO FIND Take by mouth. Med Name: VITAFUSION [facility]OMEN[facility] [place] TO FIND Med Name: VITAFUSION CALCIUM PLUS D CHEWABLES, ONCE-TWICE DAILY     UNABLE TO FIND 1,000 mcg daily. Med Name: [employer] TO FIND 2 (two) times daily. Med Name: EQUATE FIBER POWDER/MIRALAX     UNABLE TO FIND as needed (itching ears). Med Name: FLUOCINONIDE TOPICAL SOLUTION (DROPS)     ZOLPIDEM TARTRATE ER PO Take 2.5 mg by mouth at bedtime.     Tiotropium Bromide Monohydrate (SPIRIVA RESPIMAT) 1.25 MCG/ACT AERS Inhale 2 puffs into the lungs daily. 4 g 0   UNABLE TO FIND Med Name: VITAFUSION OMEGA-3, ONCE-TWICE DAILY     No current facility-administered medications for this visit.    Socially she is divorced has 4 children 9 grandchildren. She does exercise. No tobacco use. She does occasional wine.  ROS General: Negative; No fevers, chills, or night sweats;  HEENT: Negative; No changes in vision or hearing, sinus congestion, difficulty swallowing Pulmonary: Negative; No cough, wheezing, shortness of breath, hemoptysis Cardiovascular: Positive for occasional chest wall pain and nocturnal palpitations GI: Negative; No nausea, vomiting, diarrhea, or abdominal pain GU: Negative; No dysuria, hematuria, or difficulty voiding Musculoskeletal: Positive for significant scoliosis; fibromyalgia;  joint pain, or weakness Hematologic/Oncology: Negative; no easy bruising, bleeding Endocrine: Negative; no heat/cold intolerance; no diabetes Neuro: History of migraine headaches Skin: Positive for fungal infection of her toenail Psychiatric: Negative; No behavioral problems, depression Sleep: Positive for sleep apnea ; No snoring, daytime sleepiness, hypersomnolence, bruxism, restless legs, hypnogognic  hallucinations, no cataplexy Other comprehensive 14 point system review is negative.  PE BP 128/70    Pulse (!) 59    Ht 5' 1"  (1.549 m)    Wt 118 lb 3.2 oz (53.6 kg)    SpO2 99%    BMI 22.33 kg/m    Repeat blood pressure by me was 124/68  Wt Readings from Last 3 Encounters:  10/22/21 119 lb (54 kg)  10/10/21 118 lb 3.2 oz (53.6 kg)  02/13/21 121 lb 9.6 oz (55.2 kg)   General: Alert, oriented, no distress.  Skin: normal turgor, no rashes, warm and dry HEENT: Normocephalic, atraumatic. Pupils equal round and reactive to light; sclera anicteric; extraocular muscles intact;  Nose without nasal septal hypertrophy Mouth/Parynx benign; Mallinpatti scale 3 Neck: No JVD, no carotid bruits; normal carotid upstroke Lungs: clear to ausculatation and percussion; no wheezing or rales Chest wall: without tenderness to palpitation Heart: PMI not displaced, RRR, s1 s2 normal, 1/6 systolic murmur, no diastolic murmur, no rubs, gallops, thrills, or heaves Abdomen: soft,  nontender; no hepatosplenomehaly, BS+; abdominal aorta nontender and not dilated by palpation. Back: Significant scoliosis; no CVA tenderness Pulses 2+ Musculoskeletal: normal strength, Extremities: no clubbing cyanosis or edema, Homan's sign negative  Neurologic: grossly nonfocal; Cranial nerves grossly wnl Psychologic: Normal mood and affect    October 10, 2021 ECG (independently read by me): Sinus bradycardia at 59,   Feb 13, 2021 ECG (independently read by me): Sinus bradycardia at 59 with mild arhythmia  October 2021 ECG (independently read by me): Sinus rhythm at 67 with mild arrythmia; norma intervals  April 2021 ECG (independently read by me): Normal sinus rhythm at 69 bpm. No ectopy. Normal intervals.  October 18, 2019 ECG (independently read by me): Sinus bradycardia 59 bpm.  QS complex V1 V2.  Normal intervals.  No ectopy  October 2020 ECG (independently read by me): Normal sinus rhythm at 63 bpm.  QS complex V1 V2.   No ectopy.  Normal intervals.  June 2020 ECG (independently read by me): NSR at 62; no ectopy; normal intervals   November 2019 ECG (independently read by me): Normal sinus rhythm with PAC.  Ventricular rate 66.  QS V1 V2.  Mild T wave abnormality  June 2019 ECG (independently read by me): Normal sinus rhythm at 64 bpm.  Normal intervals.  No ectopy.  September 2018 ECG (independently read by me): Normal sinus rhythm at 64 bpm.  Isolated PAC.  QTc interval 414 ms.  QS V1 V2, unchanged.  May 2018 ECG (independently read by me): Normal sinus rhythm at 65 bpm.  QS V1, V2.  Normal intervals.  No ST segment changes.  March 2018 ECG (independently read by me): Normal sinus rhythm at 65 bpm.  No ectopy.  Normal intervals.  September 2017 ECG (independently read by me): Normal sinus rhythm at 62 bpm.  QS V1 and V2.  Normal intervals.  April 2017 ECG (independently read by me): Normal sinus rhythm at 70 bpm.  No ectopy.  PR interval 158 ms and QTc interval 414 ms.  March 2017 ECG (independently read by me): normal sinus rhythm at 61 bpm..  No ST segment changes.  Normal intervals.  QTc interval 396 ms.  August 2014 ECG (independently read by me): Normal sinus rhythm at 63 bpm.  QRS couplets V1 V2.  No cigarette ST-T changes.  May 2016 ECG (independently read by me): Sinus bradycardia 59 bpm.  No ectopy.  February 2016 ECG (independently read by me): Sinus bradycardia 56 bpm.  Normal intervals.  Prior ECG (independently read by me): Normal sinus rhythm at 62 beats per minute.  QTc interval 411 milliseconds.  QRS complex V1, V2.  Normal intervals.  ECG (independently read by me): Normal sinus rhythm at 62 beats per minute. Normal intervals. QTc interval 399 ms.  Prior ECG of 07/13/2013: Sinus rhythm at 61 beats per minute. QTc interval 406 ms. PR interval normal at 168 ms.  LABS: BMP Latest Ref Rng & Units 10/09/2021 02/11/2021 12/05/2017  Glucose 70 - 99 mg/dL - 102(H) 129(H)  BUN 8 - 23 mg/dL  - 13 13  Creatinine 0.44 - 1.00 mg/dL 1.10(H) 0.89 0.87  Sodium 135 - 145 mmol/L - 134(L) 136  Potassium 3.5 - 5.1 mmol/L - 4.1 3.8  Chloride 98 - 111 mmol/L - 101 100(L)  CO2 22 - 32 mmol/L - 29 26  Calcium 8.9 - 10.3 mg/dL - 9.9 9.6   Hepatic Function Latest Ref Rng & Units 02/11/2021 12/05/2017 07/01/2017  Total Protein 6.5 - 8.1 g/dL  7.0 7.6 7.2  Albumin 3.5 - 5.0 g/dL 3.9 4.4 4.4  AST 15 - 41 U/L 21 30 28   ALT 0 - 44 U/L 10 18 16   Alk Phosphatase 38 - 126 U/L 49 55 40  Total Bilirubin 0.3 - 1.2 mg/dL 0.6 0.6 1.3(H)  Bilirubin, Direct 0.1 - 0.5 mg/dL - - 0.1   CBC Latest Ref Rng & Units 02/11/2021 04/20/2020 12/05/2017  WBC 4.0 - 10.5 K/uL 6.5 8.7 9.2  Hemoglobin 12.0 - 15.0 g/dL 11.7(L) 12.4 12.0  Hematocrit 36.0 - 46.0 % 35.0(L) 37.9 36.0  Platelets 150 - 400 K/uL 237 267 288   Lab Results  Component Value Date   TSH 1.07 12/03/2016  Lipid Panel     Component Value Date/Time   CHOL 154 12/03/2016 0845   TRIG 81 12/03/2016 0845   HDL 67 12/03/2016 0845   CHOLHDL 2.3 12/03/2016 0845   VLDL 16 12/03/2016 0845   LDLCALC 71 12/03/2016 0845     RADIOLOGY:   CT CHEST 10/09/2021 IMPRESSION: 1. 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 Chronic aspiration could have a similar appearance. 2. Part solid nodule of the right lower lobe measuring 7.5 mm in mean diameter with 4 mm solid component. Follow-up non-contrast CT recommended at 3-6 months to confirm persistence. If unchanged, and solid component remains <6 mm, annual CT is recommended until 5 years of stability has been established. If persistent these nodules should be considered highly suspicious if the solid component of the nodule is 6 mm or greater in size and enlarging. This recommendation follows the consensus  statement: Guidelines for Management of Incidental Pulmonary Nodules Detected on CT Images: From the Fleischner Society 2017; Radiology 2017; 284:228-243. 3.  Aortic Atherosclerosis (ICD10-I70.0).    IMPRESSION:  1. Primary hypertension   2. Exertional dyspnea   3. Mitral regurgitation   4. Bronchiectasis without complication ([place])   5. PSVT (paroxysmal supraventricular tachycardia) (HCC)   6. Pulmonary nodules   7. Other idiopathic scoliosis, thoracolumbar region   8. OSA (obstructive sleep apnea)   9. Chronic venous insufficiency of lower extremity      ASSESSMENT AND PLAN: Ms. Bergevin is an 88 year old female who has a history of documented SVT and PACs/PVCs which have been controlled with beta blocker therapy. On her echo in March 2014 she had normal systolic function with grade 1 diastolic dysfunction and had  significant left atrial dilatation and mild dilatation of the right ventricle with moderate dilatation of the right atrium. She had mild/moderate pulmonary hypertension with PA pressures of 42 mm. An echo in October 2018 showed an EF of 55 to 60% with grade 1 diastolic dysfunction, mitral annular calcification with mild MR, and mild pulmonary hypertension with PA pressure 34 mm. Her left atrium was mild to moderately dilated.  Her previous history of ectopy and palpitations have continued to be absent on her current regimen of metoprolol succinate 75 mg twice a day.  Physical exam reveals a 2/6 systolic murmur at the apex contributed by her mitral regurgitation.  She continues to have issues with her scoliosis causing rib cage and hip bone pain.  She has to sleep on her side.  She was recently evaluated by Dr. Earlie Server for elevated monocytes who felt she was stable.  Her recent CBC had shown normal absolute  monocyte count at 0.9.  She has had issues with varicose veins previously had undergone lower venous reflux study October 2021 which did not reveal any evidence for deep vein  thrombosis or superficial venous thrombosis in the right lower extremity.  Venous reflux was noted in the right saphenofemoral junction, right greater saphenous vein in the thigh and right greater saphenous vein in the calf.  Her migraine headaches have been stable.  Since my last evaluation she has noticed some increased shortness of breath with activity.  She underwent a chest CT yesterday which I was able to obtain the results.  This revealed athero-'s sclerotic disease of the thoracic aorta and mitral annular calcification.  She had prominent subcentimeter probably reactive mediastinal lymph nodes.  There was scattered focal areas of bronchiectasis and clustered nodules in the lower lungs most prominently in the right middle lobe and lingula.  In addition there was a part solid nodule in the right lower lobe measuring 7.5 mm in mean diameter with 4 mm solid component.  A follow-up noncontrast CT was recommended at 3 to 6 months.  I have recommended she undergo pulmonary evaluation to see Dr. Chase Caller.  On exam today, cardiac murmur seen more prominent at the apex and a follow-up echo Doppler study was recommended.  She has untreated sleep apnea but did not tolerate CPAP and also stopped using an oral appliance due to shifting of her teeth.  She believes she is sleeping adequately.  I will see her in 6 months for follow-up evaluation.    Troy Sine, MD, [facility]  10/26/2021 11:50 AM

## 2021-10-10 NOTE — Patient Instructions (Addendum)
Medication Instructions:  The current medical regimen is effective;  continue present plan and medications as directed. Please refer to the Current Medication list given to you today.   *If you need a refill on your cardiac medications before your next appointment, please call your pharmacy*  Lab Work:    NONE      Testing/Procedures:  Echocardiogram - Your physician has requested that you have an echocardiogram. Echocardiography is a painless test that uses sound waves to create images of your heart. It provides your doctor with information about the size and shape of your heart and how well your hearts chambers and valves are working. This procedure takes approximately one hour. There are no restrictions for this procedure. This will be performed at either our [facility] location - [address], [place] location [facility] 2nd floor.   Special Instructions REFERRAL TO PULMONARY  Follow-Up: Your next appointment:  6 month(s) In Person with Shelva Majestic, MD   Please call our office 2 months in advance to schedule this appointment   At [facility], you and your health needs are our priority.  As part of our continuing mission to provide you with exceptional heart care, we have created designated Provider Care Teams.  These Care Teams include your primary Cardiologist (physician) and Advanced Practice Providers (APPs -  Physician Assistants and Nurse Practitioners) who all work together to provide you with the care you need, when you need it.  We recommend signing up for the patient portal called "MyChart".  Sign up information is provided on this After Visit Summary.  MyChart is used to connect with patients for Virtual Visits (Telemedicine).  Patients are able to view lab/test results, encounter notes, upcoming appointments, etc.  Non-urgent messages can be sent to your provider as well.   To learn more about what you can do with MyChart, go to  [email or web address].

## 2021-10-15 ENCOUNTER — Ambulatory Visit (INDEPENDENT_AMBULATORY_CARE_PROVIDER_SITE_OTHER): Payer: Medicare Other

## 2021-10-15 ENCOUNTER — Other Ambulatory Visit: Payer: Self-pay

## 2021-10-15 DIAGNOSIS — R011 Cardiac murmur, unspecified: Secondary | ICD-10-CM

## 2021-10-15 LAB — ECHOCARDIOGRAM COMPLETE
AR max vel: 2.43 cm2
AV Area VTI: 2.67 cm2
AV Area mean vel: 2.33 cm2
AV Mean grad: 3 mmHg
AV Peak grad: 5.8 mmHg
Ao pk vel: 1.2 m/s
Area-P 1/2: 2.81 cm2
Calc EF: 58.4 %
MV M vel: 4.39 m/s
MV Peak grad: 76.9 mmHg
S' Lateral: 3.18 cm
Single Plane A2C EF: 52.8 %
Single Plane A4C EF: 66.3 %

## 2021-10-16 DIAGNOSIS — F320 Major depressive disorder, single episode, mild: Secondary | ICD-10-CM | POA: Diagnosis not present

## 2021-10-16 DIAGNOSIS — I10 Essential (primary) hypertension: Secondary | ICD-10-CM | POA: Diagnosis not present

## 2021-10-16 DIAGNOSIS — M810 Age-related osteoporosis without current pathological fracture: Secondary | ICD-10-CM | POA: Diagnosis not present

## 2021-10-16 DIAGNOSIS — K219 Gastro-esophageal reflux disease without esophagitis: Secondary | ICD-10-CM | POA: Diagnosis not present

## 2021-10-22 ENCOUNTER — Telehealth: Payer: Self-pay | Admitting: Cardiovascular Disease

## 2021-10-22 ENCOUNTER — Encounter: Payer: Self-pay | Admitting: Internal Medicine

## 2021-10-22 ENCOUNTER — Telehealth: Payer: Self-pay | Admitting: Internal Medicine

## 2021-10-22 ENCOUNTER — Ambulatory Visit (INDEPENDENT_AMBULATORY_CARE_PROVIDER_SITE_OTHER): Payer: Medicare Other | Admitting: Internal Medicine

## 2021-10-22 ENCOUNTER — Other Ambulatory Visit: Payer: Self-pay

## 2021-10-22 VITALS — BP 116/64 | HR 59 | Temp 97.9°F | Ht 61.0 in | Wt 119.0 lb

## 2021-10-22 DIAGNOSIS — J479 Bronchiectasis, uncomplicated: Secondary | ICD-10-CM | POA: Diagnosis not present

## 2021-10-22 DIAGNOSIS — I5189 Other ill-defined heart diseases: Secondary | ICD-10-CM | POA: Diagnosis not present

## 2021-10-22 DIAGNOSIS — R0609 Other forms of dyspnea: Secondary | ICD-10-CM | POA: Diagnosis not present

## 2021-10-22 DIAGNOSIS — R062 Wheezing: Secondary | ICD-10-CM | POA: Diagnosis not present

## 2021-10-22 DIAGNOSIS — I340 Nonrheumatic mitral (valve) insufficiency: Secondary | ICD-10-CM

## 2021-10-22 DIAGNOSIS — R079 Chest pain, unspecified: Secondary | ICD-10-CM

## 2021-10-22 DIAGNOSIS — M4125 Other idiopathic scoliosis, thoracolumbar region: Secondary | ICD-10-CM | POA: Diagnosis not present

## 2021-10-22 DIAGNOSIS — R053 Chronic cough: Secondary | ICD-10-CM

## 2021-10-22 DIAGNOSIS — R7989 Other specified abnormal findings of blood chemistry: Secondary | ICD-10-CM

## 2021-10-22 MED ORDER — SPIRIVA RESPIMAT 1.25 MCG/ACT IN AERS: 2.0000 | INHALATION_SPRAY | Freq: Every day | RESPIRATORY_TRACT | 0 refills | Status: DC

## 2021-10-22 NOTE — Telephone Encounter (Signed)
Patient calling in to see if Dr. [name] wants to see her prior to May base off her results. Please advise

## 2021-10-22 NOTE — Telephone Encounter (Signed)
Attempted to call patient, unable to reach. Line rang multiple times and then clicked off. Will try again at another time.

## 2021-10-22 NOTE — Progress Notes (Signed)
Patient seen in the office today and instructed on use of spiriva 1.25.  Patient expressed understanding and demonstrated technique.

## 2021-10-22 NOTE — Progress Notes (Signed)
OV 10/22/2021  Subjective:  Patient ID: Janet Nguyen, female , DOB: 19-Nov-1931 , age 88 y.o. , MRN: [id number] , ADDRESS: [place] [address] PCP Lawerance Cruel, MD Patient Care Team: Lawerance Cruel, MD as PCP - General (Family Medicine) Troy Sine, MD as PCP - Cardiology (Cardiology)  This Provider for this visit: Treatment Team:  Attending Provider: Brand Males, MD    10/22/2021 -   Chief Complaint  Patient presents with   Consult    Pt had a recent CT performed which is the reason for today's visit.     HPI Janet Nguyen 88 y.o. -referred for shortness of breath and cough.  Has known severe scoliosis.  Found to have bronchiectasis on the CT chest.  History is provided by the patient and review of the medical records.  She is a very good historian.  She is divorced.  She has grown children who all live all over the country.  She has several grandchildren.  She says she is known to have deviated nasal septum, celiac disease for which she is on a restrictive diet, sleep apnea but she cannot tolerate CPAP.  She is most importantly known to have severe scoliosis that was first diagnosed in her younger years.  She says after age 23 is around when they made the diagnosis although she suspect she has had a longer period she is just on observation treatment.  She did yoga and severe exercise to keep herself physically fit.  Over the years scoliosis is gotten worse that she is lost 6 inches in total height.  This is producing physical pressure effects.  She thinks with the onset of the pandemic and particularly with worsening of the scoliosis and particularly for the last few months or so she said dyspnea on exertion [echo did show grade 2 diastolic dysfunction], decreased socializing, decreased sleep, feeling depressed.  Also she feels her migraines are worse.  She when she wakes up in the morning she feels an elephant is on the chest.   She also has a cough.  The respiratory symptoms are mild overall.  But she does definitely have it and is definitely new.  Sometime back a year ago or 2 years ago she is to be able to walk at least a mile now she gets dyspneic walking a block.  Of note she reports a few episodes of severe vomiting acute episodes 2 times in 2022.  Idiopathic.  There is associated heartburn  She has had the COVID-vaccine but does not have the COVID itself  Review of system positive for fatigue for the last several months arthralgia for the last several years.  Denies any family history of lung disease  Denies any substance abuse  Lives in a 86 year old home.  Detailed review shows no organic dust antigen exposure.  She does have a bird feeder.  Occupational history: Detail organic and inorganic antigen exposure history is negative    CT Chest data 10/09/21 -    IMPRESSION: 1. 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 Chronic aspiration could have a similar appearance. 2. Part solid nodule of the right lower lobe measuring 7.5 mm in mean diameter with 4 mm solid component. Follow-up non-contrast CT recommended at 3-6 months to confirm persistence. If  unchanged, and solid component remains <6 mm, annual CT is recommended until 5 years of stability has been established. If persistent these nodules should be considered highly suspicious if the solid component of the nodule is 6 mm or greater in size and enlarging. This recommendation follows the consensus statement: Guidelines for Management of Incidental Pulmonary Nodules Detected on CT Images: From the Fleischner Society 2017; Radiology 2017; 284:228-243. 3.  Aortic Atherosclerosis (ICD10-I70.0).     Electronically Signed   By: Yetta Glassman  M.D.   On: 10/09/2021 15:34  No results found.    PFT  No flowsheet data found.     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   reports that she has never smoked. She has never used smokeless tobacco.  Past Surgical History:  Procedure Laterality Date   APPENDECTOMY     ruptured at age 63 and had surgery   CARDIAC CATHETERIZATION  01/27/06   cataract surgery  2015   Dr. Herbert Deaner; March & April 2015    Allergies  Allergen Reactions   Gluten Meal     Unknown   Naproxen     Stomach upset   Codeine Nausea Only    Immunization History  Administered Date(s) Administered   Influenza Split 06/26/2014   Influenza, High Dose Seasonal PF 06/15/2015, 06/19/2016, 07/24/2017, 05/24/2019, 06/20/2021   Influenza,inj,quad, With Preservative 06/29/2018   Influenza-Unspecified 06/30/2013   PFIZER(Purple Top)SARS-COV-2 Vaccination 10/14/2019, 11/04/2019, 07/18/2020, 07/01/2021   Pneumococcal Conjugate-13 03/09/2014   Tdap 03/21/2009, 05/29/2021    Family History  Problem Relation Age of Onset   Breast cancer Mother    Heart disease Father    Migraines Neg Hx      Current Outpatient Medications:    ALPRAZolam (XANAX) 0.5 MG tablet, Take 0.5 mg by mouth as needed., Disp: , Rfl:    amLODipine (NORVASC) 5 MG tablet, TAKE ONE TABLET BY MOUTH DAILY, Disp: 90 tablet, Rfl: 3   Biotin 1000 MCG tablet, 1 tablet, Disp: , Rfl:    Cholecalciferol (VITAMIN D3) 50 MCG (2000 UT) capsule, 1 capsule, Disp: , Rfl:    clidinium-chlordiazePOXIDE (LIBRAX) 5-2.5 MG capsule, Take 1 capsule by mouth as needed., Disp: , Rfl:    EQ ACETAMINOPHEN PO, Take 500 mg by mouth as needed (pain)., Disp: , Rfl:     FLUTICASONE PROPIONATE, NASAL, NA, Place into the nose daily as needed (rhinitis)., Disp: , Rfl:    metoprolol succinate (TOPROL-XL) 50 MG 24 hr tablet, TAKE ONE AND ONE HALF TABLETS BY MOUTH TWO TIMES A DAY, Disp: 270 tablet, Rfl: 3   Multiple Vitamins-Minerals (PRESERVISION AREDS 2 PO), Take 1 tablet by mouth every other day. , Disp: , Rfl:    Omega 3 1000 MG CAPS, 1 capsule, Disp: , Rfl:    Phenylephrine-APAP-guaiFENesin (EQ SINUS CONGESTION & PAIN PO), Take by mouth as needed (for sinus pain). Contains acetaminophen 325 mg and Phenylephrine 5 mg, Disp: , Rfl:    Tiotropium Bromide Monohydrate (SPIRIVA RESPIMAT) 1.25 MCG/ACT AERS, Inhale 2 puffs into the lungs daily., Disp: 4 g, Rfl: 0   traMADol (ULTRAM) 50 MG tablet, Take 50 mg by mouth 2 (two) times daily as needed., Disp: , Rfl:    UNABLE TO FIND, Take by mouth. Med Name: [facility] GUMMIES ONCE-TWICE DAILY, Disp: ,  Rfl:    UNABLE TO FIND, Med Name: VITAFUSION CALCIUM PLUS D CHEWABLES, ONCE-TWICE DAILY, Disp: , Rfl:    UNABLE TO FIND, Med Name: VITAFUSION OMEGA-3, ONCE-TWICE DAILY, Disp: , Rfl:    UNABLE TO FIND, 1,000 mcg daily. Med Name: [facility]OT[facility], Disp: , Rfl:    UNABLE TO FIND, 2 (two) times daily. Med Name: EQUATE FIBER POWDER/MIRALAX, Disp: , Rfl:    UNABLE TO FIND, as needed (itching ears). Med Name: FLUOCINONIDE TOPICAL SOLUTION (DROPS), Disp: , Rfl:    ZOLPIDEM TARTRATE ER PO, Take 2.5 mg by mouth at bedtime., Disp: , Rfl:       Objective:   Vitals:   10/22/21 1034  BP: 116/64  Pulse: (!) 59  Temp: 97.9 F (36.6 C)  TempSrc: Oral  SpO2: 99%  Weight: 119 lb (54 kg)  Height: 5' 1"  (1.549 m)    Estimated body mass index is 22.48 kg/m as calculated from the following:   Height as of this encounter: 5' 1"  (1.549 m).   Weight as of this encounter: 119 lb (54 kg).  @WEIGHTCHANGE @  Filed Weights   10/22/21 1034  Weight: 119 lb (54 kg)     Physical Exam  General: No distress.  Pleasant female.   Lean body mass index.  Has severe scoliosis. Neuro: Alert and Oriented x 3. GCS 15. Speech normal Psych: Pleasant Resp:  Barrel Chest - no.  Wheeze - no, Crackles - no, No overt respiratory distress CVS: Normal heart sounds. Murmurs - no Ext: Stigmata of Connective Tissue Disease - no HEENT: Normal upper airway. PEERL +. No post nasal drip        Assessment:       ICD-10-CM   1. DOE (dyspnea on exertion)  R06.09 Resp Allergy Profile Regn2DC [place] MD [place]    IgG, IgA, IgM    IgE    Alpha-1 antitrypsin phenotype    ANA    Rheumatoid Factor    Cyclic citrul peptide antibody, IgG    Sjogren's syndrome antibods(ssa + ssb)    D-dimer, quantitative    Pulmonary function test    D-dimer, quantitative    Resp Allergy Profile Regn2DC DE MD [place]    Pulmonary function test    Sjogren's syndrome antibods(ssa + ssb)    Cyclic citrul peptide antibody, IgG    Rheumatoid Factor    ANA    Alpha-1 antitrypsin phenotype    IgE    IgG, IgA, IgM    2. Wheezing  R06.2     3. Chronic cough  R05.3     4. Bronchiectasis without complication (HCC)  H47.4 Flutter valve    5. Other idiopathic scoliosis, thoracolumbar region  M41.25     6. Mitral valve insufficiency, unspecified etiology  I34.0     7. Grade II diastolic dysfunction  Q59.56          Plan:     Patient Instructions     ICD-10-CM   1. DOE (dyspnea on exertion)  R06.09     2. Wheezing  R06.2     3. Chronic cough  R05.3     4. Bronchiectasis without complication ([place])  L87.5     5. Other idiopathic scoliosis, thoracolumbar region  M41.25     6. Mitral valve insufficiency, unspecified etiology  I34.0     7. Grade II diastolic dysfunction  I43.32       DOE (dyspnea on exertion) Wheezing Chronic cough Bronchiectasis without complication (HCC) Other  idiopathic scoliosis, thoracolumbar region   - scoliosis nd bronchiectasis contributing to respiratory symptoms   Plan  - Please start spiriva respimat 2 puff  once daily  - take sample, script and show technique - to see if improves symptoms - start flutter valve 5-10 times daily - Check blood RAST allergy profile, - Check blood IgA, IgE, IgG, and IgM - check alpha 1 AT phenotype - Check ANA, RF, CCP, ssA, ssB - Check quantiferon gold TB test - Do full PFT - Continue PT for scoliosis - Hold off bronchoscopy for now  Right lower lobe lung nodule 7.66m on CT 10/09/21 - new  - low risk for lung cancer (paitnet givne information on this 6:23 PM 10/22/2021)  Plan  - repeat CT chest without contrast end of April 2023 (3 months) - inviated to participate In veracyte [place] nodule study and explained information (she wants ICF  emailed to her and postal mail) - she is somewhat averse to nasal procedures due to deviated nasal septum   Mitral valve insufficiency, unspecified etiology Grade II diastolic dysfunction  - also definitely contributing to shortness of breath  Plan' - please d/w Dr KClaiborne Billings SAD   - try white light therapy (caution - if migraine gets worse stop it)   Followup - 4-8 weeks with APP to review tests and progress with spiriva - 12-16 weeks with Semaje Kinker - 30 min visit   ( Level 05 visit: New 60-74 min   in  visit type: on-site physical face to visit  in total care time and counseling or/and coordination of care by this undersigned MD - Dr MBrand Males This includes one or more of the following on this same day 10/22/2021: pre-charting, chart review, note writing, documentation discussion of test results, diagnostic or treatment recommendations, prognosis, risks and benefits of management options, instructions, education, compliance or risk-factor reduction. It excludes time spent by the [facility] office staff in the care of the patient. Actual time 636min)   SIGNATURE    Dr. MBrand Males M.D., F.C.C.P,  Pulmonary and Critical Care Medicine Staff Physician, CRandallDirector - Interstitial  Lung Disease  Program  Pulmonary [place] [id number] Pager: [phone] If no answer or between  15:00h - 7:00h: call [phone] Telephone: [phone]  6:32 PM 10/22/2021

## 2021-10-22 NOTE — Telephone Encounter (Signed)
[facility]  D-dimer back at 1.5  - she also was confused about spirivan and flutter valve but I went over this on phone - > she does not want to come in  for teaching. Just FYI   - also [facility] will be sending her via email a consent form for nodule study of Dr Valeta Harms - fyi  Plan  - do VQ scan and duplex LE this week.

## 2021-10-22 NOTE — Patient Instructions (Addendum)
ICD-10-CM   1. DOE (dyspnea on exertion)  R06.09     2. Wheezing  R06.2     3. Chronic cough  R05.3     4. Bronchiectasis without complication ([place])  Q46.9     5. Other idiopathic scoliosis, thoracolumbar region  M41.25     6. Mitral valve insufficiency, unspecified etiology  I34.0     7. Grade II diastolic dysfunction  G29.52       DOE (dyspnea on exertion) Wheezing Chronic cough Bronchiectasis without complication (HCC) Other idiopathic scoliosis, thoracolumbar region   - scoliosis nd bronchiectasis contributing to respiratory symptoms   Plan  - Please start spiriva respimat 2 puff once daily  - take sample, script and show technique - to see if improves symptoms - start flutter valve 5-10 times daily - Check blood RAST allergy profile, - Check blood IgA, IgE, IgG, and IgM - check alpha 1 AT phenotype - Check ANA, RF, CCP, ssA, ssB - Check quantiferon gold TB test - Do full PFT - Continue PT for scoliosis - Hold off bronchoscopy for now  Right lower lobe lung nodule 7.75m on CT 10/09/21 - new  - low risk for lung cancer (paitnet givne information on this 6:23 PM 10/22/2021)  Plan  - repeat CT chest without contrast end of April 2023 (3 months) - inviated to participate In veracyte NIGHT[place]ALE nodule study and explained information (she wants ICF  emailed to her and postal mail) - she is somewhat averse to nasal procedures due to deviated nasal septum   Mitral valve insufficiency, unspecified etiology Grade II diastolic dysfunction  - also definitely contributing to shortness of breath  Plan' - please d/w Dr KClaiborne Billings SAD   - try white light therapy (caution - if migraine gets worse stop it)   Followup - 4-8 weeks with APP to review tests and progress with spiriva - 12-16 weeks with Skeeter Sheard - 30 min visit

## 2021-10-22 NOTE — Telephone Encounter (Signed)
° °  PAtiet is confused about instructions and technique on both spiriva and flutter valve. I asked her if she cn bring her back for device teaching (OR) she can wait till next visit 11/22/21 . She does not like either options. So I went over flutter valve/acapella over phone and told her to youtube spiriva use   Also gave her information n 57m in size Right side  - and plan      SIGNATURE    Dr. MBrand Males M.D., F.C.C.P,  Pulmonary and Critical Care Medicine Staff Physician, CBexleyDirector - Interstitial Lung Disease  Program  Pulmonary [place] [id number] NPI Number:  NPI ##[id number] Pager: [phone] If no answer  -> Check AMION or Try [phone] Telephone (clinical office): [phone] Telephone (research): [phone]  6:23 PM 10/22/2021

## 2021-10-23 ENCOUNTER — Encounter (HOSPITAL_COMMUNITY): Payer: Medicare Other

## 2021-10-23 ENCOUNTER — Ambulatory Visit (HOSPITAL_COMMUNITY): Admission: RE | Admit: 2021-10-23 | Payer: Medicare Other | Source: Ambulatory Visit

## 2021-10-23 ENCOUNTER — Telehealth: Payer: Self-pay | Admitting: Internal Medicine

## 2021-10-23 DIAGNOSIS — Z658 Other specified problems related to psychosocial circumstances: Secondary | ICD-10-CM | POA: Diagnosis not present

## 2021-10-23 DIAGNOSIS — R899 Unspecified abnormal finding in specimens from other organs, systems and tissues: Secondary | ICD-10-CM | POA: Diagnosis not present

## 2021-10-23 NOTE — Progress Notes (Signed)
D-dimer high - sent separate note yesterday for action. Get Vq/doppler. Judson Roch can discuss the high RF at her followup

## 2021-10-23 NOTE — Telephone Encounter (Signed)
Per Dr. Chase Caller the patient would like consent for Veracyte sent to her email. Address.

## 2021-10-23 NOTE — Telephone Encounter (Signed)
When pt was at the appt, I went over with pt how to use the flutter valve as well as how to use the spiriva respimat inhaler. I also printed pt out instructions on how to use the inhaler and showed her that it was being placed in the bag with the inhalers and the flutter valve.   Called and spoke with pt letting her know the results of the d-dimer letting her know that we needed to have her get a VQ scan and a doppler study done to rule out blood clots. Pt verbalized understanding. Orders have been placed. Nothing further needed.

## 2021-10-25 ENCOUNTER — Telehealth: Payer: Self-pay | Admitting: Internal Medicine

## 2021-10-25 ENCOUNTER — Ambulatory Visit (HOSPITAL_COMMUNITY)
Admission: RE | Admit: 2021-10-25 | Discharge: 2021-10-25 | Disposition: A | Discharge status: Home / Self Care | Payer: Medicare Other | Source: Ambulatory Visit | Attending: Internal Medicine | Admitting: Internal Medicine

## 2021-10-25 ENCOUNTER — Other Ambulatory Visit: Payer: Self-pay

## 2021-10-25 ENCOUNTER — Ambulatory Visit (HOSPITAL_BASED_OUTPATIENT_CLINIC_OR_DEPARTMENT_OTHER)
Admission: RE | Admit: 2021-10-25 | Discharge: 2021-10-25 | Disposition: A | Discharge status: Home / Self Care | Payer: Medicare Other | Source: Ambulatory Visit | Attending: Internal Medicine | Admitting: Internal Medicine

## 2021-10-25 DIAGNOSIS — R7989 Other specified abnormal findings of blood chemistry: Secondary | ICD-10-CM | POA: Diagnosis not present

## 2021-10-25 DIAGNOSIS — J449 Chronic obstructive pulmonary disease, unspecified: Secondary | ICD-10-CM | POA: Diagnosis not present

## 2021-10-25 DIAGNOSIS — I517 Cardiomegaly: Secondary | ICD-10-CM | POA: Diagnosis not present

## 2021-10-25 DIAGNOSIS — R079 Chest pain, unspecified: Secondary | ICD-10-CM | POA: Diagnosis not present

## 2021-10-25 MED ORDER — TECHNETIUM TO 99M ALBUMIN AGGREGATED
4.2300 | Freq: Once | INTRAVENOUS | Status: AC | PRN
  Administered 2021-10-25: 4.23 via INTRAVENOUS

## 2021-10-25 NOTE — Telephone Encounter (Signed)
Called and spoke with patient to let her know that her VQ scan was negative. No questions or concerns noted from patient. Nothing further noted

## 2021-10-25 NOTE — Telephone Encounter (Signed)
Duplex LE - neg. Let her know

## 2021-10-25 NOTE — Progress Notes (Signed)
Bilateral lower extremity venous duplex has been completed. Preliminary results can be found in CV Proc through chart review.  Results were given to [facility] at Dr. Golden Pop office.  10/25/21 11:02 AM Janet Nguyen RVT

## 2021-10-25 NOTE — Telephone Encounter (Signed)
Spoke to [place] with Vascular. He wanted to make MR aware that venous doppler was negative for DVT.   Routing to Dr. Chase Caller as an Juluis Rainier.

## 2021-10-26 ENCOUNTER — Encounter: Payer: Self-pay | Admitting: Cardiovascular Disease

## 2021-10-26 NOTE — Addendum Note (Signed)
Addended by: Shelva Majestic A on: 10/26/2021 11:51 AM   Modules accepted: Level of Service

## 2021-10-28 ENCOUNTER — Ambulatory Visit (INDEPENDENT_AMBULATORY_CARE_PROVIDER_SITE_OTHER): Payer: Medicare Other | Admitting: Internal Medicine

## 2021-10-28 ENCOUNTER — Telehealth: Payer: Self-pay | Admitting: Cardiovascular Disease

## 2021-10-28 ENCOUNTER — Other Ambulatory Visit (HOSPITAL_COMMUNITY): Payer: Self-pay

## 2021-10-28 ENCOUNTER — Telehealth: Payer: Self-pay | Admitting: Internal Medicine

## 2021-10-28 DIAGNOSIS — I2699 Other pulmonary embolism without acute cor pulmonale: Secondary | ICD-10-CM

## 2021-10-28 MED ORDER — ELIQUIS DVT/PE STARTER PACK 5 MG PO TBPK: ORAL_TABLET | ORAL | 0 refills | Status: DC

## 2021-10-28 NOTE — Telephone Encounter (Signed)
PCP Janet Cruel, MD   [place]  Has high prob PE on VQ. She is 89. Says medicines "do not agre with her" We had detailed phone cconversatin  .. I recommended DOAC Rx dose and not preventative dose. I advised her that these DOACs have been in market for > 8 years and well established.  Expalined side effects and risks of waiting untreated in settign of PE. She is very afraid of taking "new medicines". Alternatie is PE become fatal. She is weighing her options. She lives alone.  She wants to reflect on her choices - take no DOAC v low dose DOAC v full Rx dose DOAC   She also says she cannot tak large pills and is gluten intoleratc   Of note, she says wheezing is not there and wants to hold off on inhalers   Plan  - please evalaute for DOAC safety and contraindications/con meds -> and call her to make informed decision making       Latest Reference Range & Units 05/11/10 04:36 11/29/12 17:36 11/29/12 22:41 12/04/14 13:03 08/19/15 09:43 12/03/16 08:45 06/30/17 21:20 07/01/17 04:32 07/02/17 04:18 12/05/17 20:29 02/11/21 11:37 10/09/21 15:13  Creatinine 0.44 - 1.00 mg/dL 0.77 0.78 0.79 0.86 0.86 0.85 0.86 0.82 0.95 0.87 0.89 1.10 (H)  (H): Data is abnormally high    Latest Reference Range & Units 05/11/10 04:36 11/29/12 17:36 11/29/12 22:41 12/04/14 13:03 08/19/15 09:43 06/30/17 21:20 07/01/17 04:32 07/02/17 04:18 12/05/17 20:29 02/11/21 11:37  GFR, Est Non African American >60 mL/min >60 78 (L) 78 (L) 62 (L) >60 >60 >60 54 (L) 60 (L) >60  (L): Data is abnormally low

## 2021-10-28 NOTE — Telephone Encounter (Signed)
Patient states she was recently in the hospital and had tests done. She would like Dr. [name] to review the results and recommendations as soon as possible. She says her Pulmonologist wants to put her on a blood thinner and told her they would contact her after speaking with a pharmacist.

## 2021-10-28 NOTE — Telephone Encounter (Signed)
Pt is VERY angry and frustrated with [place]. She states she has been waiting all weekend for results about her VQ scan and other tests done at the hospital. She states she was treated wonderfully there but is disappointed that no one has checked on her since and that she was given medicine that she does not know how to use. Offered for her to see MR tomorrow to discuss everything in depth, but she became even more irate at that offer. When offered to put message to nurses, pt questioned how long it would take for them to respond "before I commit suicide." According to phone encounter from 1/27, pt did receive results of VQ scan so Im not sure what other test results she is wanting. Pt stated that she was just going to come in if someone did not call her with answers soon. Routing as urgent to avoid pt being any more hostile.

## 2021-10-28 NOTE — Telephone Encounter (Signed)
Spoke to patient, who is requesting VQ scan results.  She is upset that she had to wait over the weekend to receive results. I apologized for the delay.   MR, please advise. Thank

## 2021-10-28 NOTE — Telephone Encounter (Addendum)
Per test claim for 30 day supply of Eliquis, her copay is $577.76. She is eligible for 30-day free supply if she has not already used in past (one per patient per lifetime). She will have to pursue patient assistnace. No dose adjustment needed for Eliquis for therapeutic dosing. Can consider low-dose (2.75m twice daily) since age 88y/o and wt <60 kg (though this would be prophylactic dosing). Eliquis pt assistance requires she spend 3% out of pocket on rx prior to qualifying for program.   Per test claim for 30 day supply of Xarelto for starter pack for treatment dosing is $552. Will likely have to pursue pt assistance for this as well. [employer], patient will have $85 copay per month or $250 per 30 days after 12/28/21. This does not work towards deductible if patient has not met this. Therefore, she will have to pursue [place] assistance through [place]   Her CrCl based on last creatinine is 30.7 ml/min. No dose adjustment warranted for Xarelto until CrCl <30.  No significant drug interactions with current med list - patient takes omega 3 fatty acid with potential risk for bleeding or excessive bruising. She asked if she needed to discontinue omega 3 once starting DOAC. I adivsed that it is not contraindicated and she could continue  Patient was counseled on the purpose, proper use, and adverse effects of DOACs including easy bruising, nosebleeds, gums bleeding when you brush your teeth and more bleeding than normal from small cut. Reviewed signs and symptoms of major bleeding including  Red or dark brown urine, red or black tarry stool,vomiting or coughing up blood, bruises that appear for no known reason, frequent nosebleeds, bleeding gums, or unusual bleeding and any bleeding that does not stop or is very heavy.  I also reviewed that DOAC anticoagulation effect has shorter half-life than warfarin. I reviewed that risk of not treating PE at therapeutic dosing means that clot could migrate and be  fatal. She is concerned about cost but I reviewed that she should qualify for medication assistance programs. I also reviewed that she could start Eliquis ASAP with free 30-day supply to ensure she receives treatment dosing and we could work on coverage thereafter. Reviewed importance of receiving treatment dosing at minimum. She is amenable to this but only if prescription will be free. [facility] and have reached out to [facility] clinic team to acquire free trial card.   DKnox Saliva PharmD, MPH, BCPS Clinical Pharmacist (Rheumatology and Pulmonology)

## 2021-10-28 NOTE — Telephone Encounter (Signed)
Converted to phone visit

## 2021-10-28 NOTE — Telephone Encounter (Signed)
Spoke to MR vis telephone--recommended phone visit with patient to discuss results.   Spoke to patient and relayed recommendations. She would like to come in for visit. Appt scheduled 1/30/203 at 11:30. Nothing further needed at this time.

## 2021-10-28 NOTE — Progress Notes (Signed)
Type of visit: Telephone/Video Circumstance: COVID-19 national emergency Identification of patient Janet Nguyen with 1931-10-04 and MRN [id number] - 2 person identifier Risks: Risks, benefits, limitations of telephone visit explained. Patient understood and verbalized agreement to proceed Anyone else on call: no Patient location: her home This provider location: [address]; [place]; [place] [address]. [facility]. [phone]    Called earlier this morning to give positive VQ scan results for pulmonary embolism.  High probably D reported by radiologist on Friday, 10/25/2021.  She continues to feel stable.  She told me that she does not want to do the inhalers because some of the wheezing is not present but she continues to have dyspnea on exertion.  She is not had any new symptoms of worsening dyspnea orthopnea proximal nocturnal dyspnea or syncope.  There is no hemoptysis there is no pleuritic chest pain.  We had a long conversation about the implications of the results.  Explained that progressive pulm embolism would result in fatality had a sudden syncope of worsening hypoxemia.  Explained that hospitalization with a few day admission for with IV anticoagulation is a strong consideration.  However she does not want to do this.  We then proceeded to talk about oral anticoagulation from the house.  Explained that most likely she will be a candidate for DOAC such as Eliquis or Xarelto.  Explained that on my preliminary safety check there are no contraindications.  Explained that given her age and in general because she is on anticoagulation there is a bleeding risk.  Explained that for idiopathic pulm embolism we would do 6 months of full dose and then would reduce the dose after that to prophylactic dose.  Multiple times she asked me what the side effects are.  Multiple times I did tell that the main risk is bleeding risk.  Explained the alternative of progressive  pulmonary embolism and death.  She understood all this.  She again says she does not want hospitalization.  She also said that she is very sensitive to many medications and she is reluctant to have new medications.  She therefore needs to think about it.  She also said that typically when doctors prescribe her medication she will only take half of it and on the dose herself in order to avoid side effects.  I told her this is not a preferred strategy.  Told her that the alternative would be to take preventative prophylactic dose for acute pulmonary embolism but this is not an ideal situation either.  She then wanted to reflect on all this.  After that I did tell her that I would have the pharmacist call her after she had some time for reflection.  Our pharmacist did call her and most recently did update me that patient is still not decided and is not willing to start anticoagulation or go to the hospital.  Patient lives alone.   (Telephone visit - Level 03 visit: Estb 21-30 for this visit type which was visit type: telephone visit in total care time and counseling or/and coordination of care by this undersigned MD - Dr Brand Males. This includes one or more of the following for care delivered on 10/28/2021 same day: pre-charting, chart review, note writing, documentation discussion of test results, diagnostic or treatment recommendations, prognosis, risks and benefits of management options, instructions, education, compliance or risk-factor reduction. It excludes time spent by the [facility] or office staff in the care of the patient. Actual time was  25 min. E&M code is [id number])      SIGNATURE    Dr. Brand Males, M.D., F.C.C.P,  Pulmonary and Critical Care Medicine Staff Physician, [facility] Director - Interstitial Lung Disease  Program  Pulmonary [place] [facility] [place], [place], [address]  NPI Number:  NPI #[id number] [facility] Number:  [id number]  Pager: [phone], If no answer  -> Check AMION or Try [phone] Telephone (clinical office): [phone] Telephone (research): [phone]  4:34 PM 10/28/2021

## 2021-10-29 NOTE — Telephone Encounter (Signed)
Pulm note from yesterday reviewed. Pt with probable PE, refuses to go to hospital for treatment and was opposed to starting anticoagulation to treat it. Pharmacist at [facility] office also discussed need for anticoag with pt but she remained undecided. It was discussed that death is a complication of untreated PE.  She needs to start the Eliquis that her pulmonologist prescribed for her yesterday.

## 2021-10-29 NOTE — Telephone Encounter (Signed)
Free 30-day trial card for Eliquis provided to [place]. BIN [id number] Group: [id number] PCN: [id number] ID: [id number]  Pharmacy team able to process for 30 days. Per Cpht, the medication is on order to arrive later this afternoon.   I called patient to notify that she should pick up Eliquis when she receives notification that order is ready at pharmacy. She verbalized understanding and will plan to do so once she gets notification from pharmacy that order is ready for pick up  Reviewed treatment dosing: 48m twice daily x 7 days then 559mtwice daily  DeKnox SalivaPharmD, MPH, BCPS Clinical Pharmacist (Rheumatology and Pulmonology)

## 2021-10-29 NOTE — Telephone Encounter (Signed)
Janet Nguyen  Janet Nguyen been diagnosed with PE and started on treatment yesterday.  Plan - She needs a visit with nurse practitioner next week to make sure everything is going okay

## 2021-10-30 NOTE — Telephone Encounter (Signed)
Called and spoke with pt and have scheduled her an appt with TP 2/8 at 11am so we can see how she has been doing after beginning on the Eliquis. Also stated to pt that we will have her fill out pt assistance paperwork for the Eliquis and she verbalized understanding. Routing to [employer] as an FYI in regards to pt's appt.

## 2021-10-30 NOTE — Telephone Encounter (Signed)
Patient returned call to me stating that she picked up Eliquis this morning.  She states that she is still unsure about starting due to risk for bleeding. I reviewed that this is risk vs benefit with blood thinners. If she has any unusual bleeding in urine or stool or fall with head trauma, it is recommended she seek emergency care. She asked again if she could take half dose. I reviewed that Dr. Golden Pop recommendation is to take the therapeutic dose since she has PE. I reviewed that this therapeutic dosing is recommended for patients with PE and VTEs. She once again asked if anticoagulation at lower dose would be effective for small clot which she states was what her PE was. I reviewed that therapeutic dosing is again the recommended treatment regimen.  She states she is overwhlmed with cost as well. I reviewed that we will complete [employer] application for Eliquis pt assistance but clinical recommendation is to start Eliquis and receive treatment ASAP rather than wait until coverage is in place.  She will have appt for f/u the upcoming week with NP and will need to complete [employer] application at visit to receive Eliquis at no cost through pt assistance: [email or web address]  Routing to [place] as FYI  Knox Saliva, PharmD, MPH, BCPS Clinical Pharmacist (Rheumatology and Pulmonology)

## 2021-10-30 NOTE — Telephone Encounter (Signed)
Inform pt that I reviewed the pulmonology recommendation. Strongly recommend initiation of anticoagulation with eliquis.

## 2021-10-31 NOTE — Telephone Encounter (Signed)
Patient is 88 years old. She is out of the qualified age range for [employer]

## 2021-10-31 NOTE — Telephone Encounter (Signed)
-  Pt updated with MD's recommendations -Pt state she appreciate Dr. Evette Georges input and started Eliquis yesterday.  -Pt also wanted to move up appointment. Appointment rescheduled for 3/22.

## 2021-11-02 LAB — RESPIRATORY ALLERGY PANEL REGION II W/ RFLX: ~~LOC~~
Allergen, A. alternata, m6: <0.1 kU/L
Allergen, Cedar tree, t12: <0.1 kU/L
Allergen, Comm Silver Birch, t9: <0.1 kU/L
Allergen, Cottonwood, t14: <0.1 kU/L
Allergen, D pternoyssinus,d7: <0.1 kU/L
Allergen, Mouse Urine Protein, e78: <0.1 kU/L
Allergen, Mulberry, t76: <0.1 kU/L
Allergen, Oak,t7: <0.1 kU/L
Allergen, P. notatum, m1: <0.1 kU/L
Aspergillus fumigatus, m3: <0.1 kU/L
Bermuda Grass: <0.1 kU/L
Box Elder IgE: <0.1 kU/L
CLADOSPORIUM HERBARUM (M2) IGE: <0.1 kU/L
COMMON RAGWEED (SHORT) (W1) IGE: <0.1 kU/L
Cat Dander: <0.1 kU/L
Class: 0
Class: 0
Class: 0
Class: 0
Class: 0
Class: 0
Class: 0
Class: 0
Class: 0
Class: 0
Class: 0
Class: 0
Class: 0
Class: 0
Class: 0
Class: 0
Class: 0
Class: 0
Class: 0
Class: 0
Class: 0
Class: 0
Class: 0
Class: 0
Cockroach: <0.1 kU/L
D. farinae: <0.1 kU/L
Dog Dander: <0.1 kU/L
Elm IgE: <0.1 kU/L
IgE (Immunoglobulin E), Serum: 19 kU/L
Johnson Grass: <0.1 kU/L
Pecan/Hickory Tree IgE: <0.1 kU/L
Rough Pigweed  IgE: <0.1 kU/L
Sheep Sorrel IgE: <0.1 kU/L
Timothy Grass: <0.1 kU/L

## 2021-11-02 LAB — ANA: Anti Nuclear Antibody (ANA): POSITIVE — AB

## 2021-11-02 LAB — SJOGREN'S SYNDROME ANTIBODS(SSA + SSB)
SSA (Ro) (ENA) Antibody, IgG: <1 AI
SSB (La) (ENA) Antibody, IgG: <1 AI

## 2021-11-02 LAB — INTERPRETATION:

## 2021-11-02 LAB — ANTI-NUCLEAR AB-TITER (ANA TITER)
ANA TITER: 1:40 {titer} — ABNORMAL HIGH
ANA Titer 1: 1:40 {titer} — ABNORMAL HIGH

## 2021-11-02 LAB — IGG, IGA, IGM
IgG (Immunoglobin G), Serum: 1127 mg/dL (ref 600–1540)
IgM, Serum: 71 mg/dL (ref 50–300)
Immunoglobulin A: 322 mg/dL — ABNORMAL HIGH (ref 70–320)

## 2021-11-02 LAB — ALPHA-1 ANTITRYPSIN PHENOTYPE: A-1 Antitrypsin, Ser: 177 mg/dL (ref 83–199)

## 2021-11-02 LAB — CYCLIC CITRUL PEPTIDE ANTIBODY, IGG: Cyclic Citrullin Peptide Ab: <16 U

## 2021-11-02 LAB — RHEUMATOID FACTOR: Rheumatoid fact SerPl-aCnc: 102 IU/mL — ABNORMAL HIGH (ref <14)

## 2021-11-02 LAB — D-DIMER, QUANTITATIVE: D-Dimer, Quant: 1.53 mcg/mL FEU — ABNORMAL HIGH (ref <0.50)

## 2021-11-04 NOTE — Telephone Encounter (Signed)
See 10/28/21 phone note

## 2021-11-05 DIAGNOSIS — G4733 Obstructive sleep apnea (adult) (pediatric): Secondary | ICD-10-CM | POA: Diagnosis not present

## 2021-11-05 DIAGNOSIS — K117 Disturbances of salivary secretion: Secondary | ICD-10-CM | POA: Diagnosis not present

## 2021-11-05 DIAGNOSIS — K219 Gastro-esophageal reflux disease without esophagitis: Secondary | ICD-10-CM | POA: Diagnosis not present

## 2021-11-05 DIAGNOSIS — R040 Epistaxis: Secondary | ICD-10-CM | POA: Diagnosis not present

## 2021-11-06 ENCOUNTER — Telehealth: Payer: Self-pay | Admitting: Adult Health

## 2021-11-06 ENCOUNTER — Other Ambulatory Visit: Payer: Self-pay

## 2021-11-06 ENCOUNTER — Other Ambulatory Visit (HOSPITAL_COMMUNITY): Payer: Self-pay

## 2021-11-06 ENCOUNTER — Encounter: Payer: Self-pay | Admitting: Adult Health

## 2021-11-06 ENCOUNTER — Telehealth: Payer: Self-pay | Admitting: *Deleted

## 2021-11-06 ENCOUNTER — Ambulatory Visit (INDEPENDENT_AMBULATORY_CARE_PROVIDER_SITE_OTHER): Payer: Medicare Other | Admitting: Adult Health

## 2021-11-06 DIAGNOSIS — J479 Bronchiectasis, uncomplicated: Secondary | ICD-10-CM | POA: Insufficient documentation

## 2021-11-06 DIAGNOSIS — I2699 Other pulmonary embolism without acute cor pulmonale: Secondary | ICD-10-CM | POA: Insufficient documentation

## 2021-11-06 DIAGNOSIS — I2694 Multiple subsegmental pulmonary emboli without acute cor pulmonale: Secondary | ICD-10-CM | POA: Diagnosis not present

## 2021-11-06 NOTE — Telephone Encounter (Signed)
Provided patient with paperwork for patient assistance for Eliquis.  She is to return forms when completed.  Script completed and signed, faxed to patient assistance.  Will await patient forms/documents.

## 2021-11-06 NOTE — Telephone Encounter (Signed)
Patient was seen in the office today, provided the paperwork for patient assistance for Eliquis as well as what else is needed (proof of income and having spent $600 out of pocket this year at the pharmacy.  Script has been filled out, signed by Rexene Edison NP and faxed to the manufacturer.

## 2021-11-06 NOTE — Assessment & Plan Note (Signed)
Recently diagnosed pulmonary embolism positive VQ scan.  Patient has recently been started on Eliquis.  Seems to be tolerating.  Unfortunately insurance is not covering well.  We will get our pharmacy team to help [place].  We have also filled out patient assistance paperwork to help to see if this will make it more affordable.  Patient education on anticoagulation.  PE is unprovoked.  May need to consider lifelong anticoagulation or at least for 6 months to 1 year of therapy.  Will need to weigh the risk and benefit ratio.  Plan  Patient Instructions  Continue on Eliquis.  We are going to look into patient assistance.  Avoid NSAIDs -Ibuprofen, aleve, advil, etc.  Saline nasal spray Twice daily   Saline nasal gel At bedtime   Continue on Flutter valve Twice daily  .  Continue on Spiriva 2 puffs daily  Follow up with Dr. Chase Caller in 6-8 weeks and As needed   Please contact office for sooner follow up if symptoms do not improve or worsen or seek emergency care

## 2021-11-06 NOTE — Telephone Encounter (Signed)
Pt has more questions regarding her visit. States with diagnosis given but no antibiotics mentioned.  Also could any of the problem be the result of home air? Does home air contribute to lung problem? Also, just received call from ENT and that they have contacted [place] about sleep apnea evaluation. Pt would like to wait on that.  Please advise as pt has lots of detialed questions.

## 2021-11-06 NOTE — Assessment & Plan Note (Signed)
Bronchiectasis noted on CT scan.  Patient has some minimum cough but no significant active symptoms.  We will continue with bronchodilator and flutter valve.  Patient education was given Immunoglobulin levels were okay  Plan  Patient Instructions  Continue on Eliquis.  We are going to look into patient assistance.  Avoid NSAIDs -Ibuprofen, aleve, advil, etc.  Saline nasal spray Twice daily   Saline nasal gel At bedtime   Continue on Flutter valve Twice daily  .  Continue on Spiriva 2 puffs daily  Follow up with Dr. Chase Caller in 6-8 weeks and As needed   Please contact office for sooner follow up if symptoms do not improve or worsen or seek emergency care

## 2021-11-06 NOTE — Progress Notes (Signed)
_0  ID: Janet Nguyen, female    DOB: 1932/07/10, 88 y.o.   MRN: [id number]  Chief Complaint  Patient presents with   Follow-up    Referring provider: Lawerance Cruel, MD  HPI: 88 yo female never smoker seen for pulmonary consult 10/22/21 for bronchiectasis and shortness of breath found to have Pulmonary embolism on VQ scan .  Medical history of severe scoliosis /kyphosis , OSA -CPAP intolerant   TEST/EVENTS :  VQ scan 10/25/21- multiple moderate perfusion defects with RML/RLL + PE.  Venous Doppler 09/2021 neg  Echo Nml EF , Gr 2 DD, Elevated PAP 73mHg. Severe LAE and Mod RA, mod to severe MR   11/06/2021 Follow up : PE Patient returns for a 1 week follow up . Recently seen for pulmonary consult for bronchiectasis and shortness of breath. Labs showed elevated  D Dimer . Subsequent VQ scan on 10/25/21 was positive for PE. 2 D echo showed mild elevated PAP at 357mg, Gr 2 DD , Severe LAE and Mod RA, mod to severe MR. Venous doppler was negative for DVT.  Has some minimally productive dry cough. Was started on Spiriva but did not take.  She was started on Eliquis , she has started on starter pack .  Unfortunately patient's insurance does not cover and prescription is greater than $600.  We discussed patient assistance program.  We have also contacted our pharmacy department to help [place] alternative options.  Patient education on anticoagulation therapy.  Patient says she has some minimum daily cough.  She was started on Spiriva last visit.  Unfortunately did not start this.  We discussed the reason for Spiriva.  Went over her recent CT chest that shows some bronchiectasis.  Autoimmune labs did show elevated rheumatoid factor and ANA, CCP was negative.  Patient has some mild arthritis but no significant joint swelling.  Sjogren's panel was negative. Typically active with light walking. Limited with back pain with scoliosis.  Lives alone. Drives. Does light housework , shopping. Has  4 kids, lots of grandkids.     Allergies  Allergen Reactions   Gluten Meal     Unknown   Naproxen     Stomach upset   Codeine Nausea Only    Immunization History  Administered Date(s) Administered   Influenza Split 06/26/2014   Influenza, High Dose Seasonal PF 06/15/2015, 06/19/2016, 07/24/2017, 05/24/2019, 06/20/2021   Influenza,inj,quad, With Preservative 06/29/2018   Influenza-Unspecified 06/30/2013   PFIZER(Purple Top)SARS-COV-2 Vaccination 10/14/2019, 11/04/2019, 07/18/2020, 07/01/2021   Pneumococcal Conjugate-13 03/09/2014   Tdap 03/21/2009, 05/29/2021    Past Medical History:  Diagnosis Date   Arrhythmia    History of SVT with documented PVC'S and  PAC'S  12/08/12 Nuc stress test normal LV EF 74%  Event Monitor  12/01/12-01/03/13   [place]   Celiac disease    treated by Dr. MeEarlean Shawl GERD (gastroesophageal reflux disease)    Intervertebral disc stenosis of neural canal of cervical region    Irregular heart beat 11/30/12   ECHO-EF 60-65%   Osteoporosis    PMR (polymyalgia rheumatica) (HCC)    Dr. BeMarijean Bravopt states she was diagnosed 10-15 years ago, not treated at this time or any issues that she is aware of.   Scoliosis    Scoliosis    Sleep apnea 10/02/11 [facility]   Sleep study AHI -total sleep 10.3/hr  64.0/ hr during REM sleep.RDI 22.8/hr during total sleep 64.0/hr during REM sleep The lowest O2  sat during Non-REM and REM sleep was 86% and 88% respectively. 04/08/12 CPAP/BIPAP titration study [facility]    Tobacco History: Social History   Tobacco Use  Smoking Status Never  Smokeless Tobacco Never   Counseling given: Not Answered   Outpatient Medications Prior to Visit  Medication Sig Dispense Refill   ALPRAZolam (XANAX) 0.5 MG tablet Take 0.5 mg by mouth as needed.     amLODipine (NORVASC) 5 MG tablet TAKE ONE TABLET BY MOUTH DAILY 90 tablet 3   Apixaban Starter Pack, 102m and 589m (ELIQUIS DVT/PE  STARTER PACK) Take as directed on package: start with two-64m36mablets twice daily for 7 days. On day 8, switch to one-64mg87mblet twice daily. 1 each 0   EQ ACETAMINOPHEN PO Take 500 mg by mouth as needed (pain).     metoprolol succinate (TOPROL-XL) 50 MG 24 hr tablet TAKE ONE AND ONE HALF TABLETS BY MOUTH TWO TIMES A DAY 270 tablet 3   Tiotropium Bromide Monohydrate (SPIRIVA RESPIMAT) 1.25 MCG/ACT AERS Inhale 2 puffs into the lungs daily. 4 g 0   ZOLPIDEM TARTRATE ER PO Take 2.5 mg by mouth at bedtime.     Biotin 1000 MCG tablet 1 tablet (Patient not taking: Reported on 11/06/2021)     Cholecalciferol (VITAMIN D3) 50 MCG (2000 UT) capsule 1 capsule (Patient not taking: Reported on 11/06/2021)     clidinium-chlordiazePOXIDE (LIBRAX) 5-2.5 MG capsule Take 1 capsule by mouth as needed. (Patient not taking: Reported on 11/06/2021)     FLUTICASONE PROPIONATE, NASAL, NA Place into the nose daily as needed (rhinitis). (Patient not taking: Reported on 11/06/2021)     Multiple Vitamins-Minerals (PRESERVISION AREDS 2 PO) Take 1 tablet by mouth every other day.  (Patient not taking: Reported on 11/06/2021)     Omega 3 1000 MG CAPS 1 capsule (Patient not taking: Reported on 11/06/2021)     Phenylephrine-APAP-guaiFENesin (EQ SINUS CONGESTION & PAIN PO) Take by mouth as needed (for sinus pain). Contains acetaminophen 325 mg and Phenylephrine 5 mg (Patient not taking: Reported on 11/06/2021)     traMADol (ULTRAM) 50 MG tablet Take 50 mg by mouth 2 (two) times daily as needed. (Patient not taking: Reported on 11/06/2021)     UNABLE TO FIND Take by mouth. Med Name: VITAClide CliffMIES ONCE-TWICE DAILY (Patient not taking: Reported on 11/06/2021)     UNABAugustae: VITAFUSION CALCIUM PLUS D CHEWABLES, ONCETazewelltient not taking: Reported on 11/06/2021)     UNABPort Nechese: VITAFUSION OMEGA-3, ONCE-TWICE DAILY (Patient not taking: Reported on 11/06/2021)     UNABLE TO FIND 1,000 mcg daily. Med Name: [place] BIOTIN (Patient not taking: Reported on 11/06/2021)     UNABLE TO FIND 2 (two) times daily. Med Name: EQUATE FIBER POWDER/MIRALAX (Patient not taking: Reported on 11/06/2021)     UNABLake Viewneeded (itching ears). Med Name: FLUOCINONIDE TOPICAL SOLUTION (DROPS) (Patient not taking: Reported on 11/06/2021)     No facility-administered medications prior to visit.     Review of Systems:   Constitutional:   No  weight loss, night sweats,  Fevers, chills,  +fatigue, or  lassitude.  HEENT:   No headaches,  Difficulty swallowing,  Tooth/dental problems, or  Sore throat,                No sneezing, itching, ear ache, nasal congestion, post nasal drip,   CV:  No chest pain,  Orthopnea, PND,  swelling in lower extremities, anasarca, dizziness, palpitations, syncope.   GI  No heartburn, indigestion, abdominal pain, nausea, vomiting, diarrhea, change in bowel habits, loss of appetite, bloody stools.   Resp:   No chest wall deformity  Skin: no rash or lesions.  GU: no dysuria, change in color of urine, no urgency or frequency.  No flank pain, no hematuria   MS:  No joint pain or swelling.  No decreased range of motion.  No back pain.    Physical Exam  BP 108/80 (BP Location: Left Arm, Patient Position: Sitting, Cuff Size: Normal)    Pulse 85    Temp 98.1 F (36.7 C) (Oral)    Ht _0  (1.549 m)    Wt 120 lb (54.4 kg)    SpO2 97%    BMI 22.67 kg/m   GEN: A/Ox3; pleasant , NAD, well nourished , thin and elderly   HEENT:  [place]/AT,  , NOSE-clear, THROAT-clear, no lesions, no postnasal drip or exudate noted.   NECK:  Supple w/ fair ROM; no JVD; normal carotid impulses w/o bruits; no thyromegaly or nodules palpated; no lymphadenopathy.    RESP  Clear  P & A; w/o, wheezes/ rales/ or rhonchi. no accessory muscle use, no dullness to percussion, positive kyphosis  CARD:  RRR, no m/r/g, tr  peripheral edema, pulses intact, no cyanosis or clubbing.  Positive varicose veins  GI:   Soft & nt;  nml bowel sounds; no organomegaly or masses detected.   Musco: Warm bil, no deformities or joint swelling noted.   Neuro: alert, no focal deficits noted.    Skin: Warm, no lesions or rashes    Lab Results:    BMET    Imaging: DG Chest 2 View  Result Date: 10/25/2021 CLINICAL DATA:  Elevated D-dimer EXAM: CHEST - 2 VIEW COMPARISON:  Chest x-ray 10/08/2021 FINDINGS: Heart is mildly enlarged. Mediastinum appears stable. Calcified plaques in the aortic arch and tortuosity of the thoracic aorta. Lungs are hyperinflated with prominent interstitial lung markings. Linear band of platelike atelectasis or scarring in the right mid lung zone. No pleural effusion or pneumothorax. IMPRESSION: 1. COPD 2. Mild cardiomegaly. Electronically Signed   By: Ofilia Neas M.D.   On: 10/25/2021 12:20   DG Chest 2 View  Result Date: 10/09/2021 CLINICAL DATA:  Shortness of breath, rib pain, scoliosis EXAM: CHEST - 2 VIEW COMPARISON:  06/30/2017 FINDINGS: Mild bilateral chronic interstitial thickening. Increased interstitial thickening compared with prior examinations concerning for superimposed interstitial edema versus interstitial infection. No focal consolidation. No pleural effusion or pneumothorax. Heart and mediastinal contours are unremarkable. No acute osseous abnormality. Severe dextroscoliosis of the thoracolumbar spine. IMPRESSION: 1. Bilateral interstitial thickening concerning for interstitial edema versus interstitial infection including atypical viral pneumonia. Underlying mild chronic interstitial lung disease. Electronically Signed   By: Kathreen Devoid M.D.   On: 10/09/2021 08:59   CT CHEST W CONTRAST  Result Date: 10/09/2021 CLINICAL DATA:  Follow-up chest x-ray EXAM: CT CHEST WITH CONTRAST TECHNIQUE: Multidetector CT imaging of the chest was performed during intravenous contrast administration. RADIATION DOSE REDUCTION: This exam was performed according to the departmental dose-optimization  program which includes automated exposure control, adjustment of the mA and/or kV according to patient size and/or use of iterative reconstruction technique. CONTRAST:  60m OMNIPAQUE IOHEXOL 350 MG/ML SOLN COMPARISON:  Chest x-ray dated October 08, 2021 FINDINGS: Cardiovascular: Cardiomegaly. No pericardial effusion. No definite coronary artery calcifications. Mitral annular calcifications. Atherosclerotic disease of the thoracic aorta. Mediastinum/Nodes: Esophagus is  unremarkable. Prominent subcentimeter mediastinal lymph nodes, likely reactive. Lungs/Pleura: Central airways are patent. Scattered areas of focal bronchiectasis and clustered nodules seen in the lower lungs but most pronounced in the right middle lobe and lingula. Largest solid nodule is located in the left lower lobe and measures 5 mm in mean diameter on series 4, image 85. Scattered small subsolid pulmonary nodules are also seen. Reference part solid nodule of the right lower lobe measuring 8 x 7 mm in total diameter with 4 mm solid component. No pleural effusion or pneumothorax. Upper Abdomen: No acute abnormality. Musculoskeletal: No chest wall abnormality. No acute or significant osseous findings. IMPRESSION: 1. 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 Chronic aspiration could have a similar appearance. 2. Part solid nodule of the right lower lobe measuring 7.5 mm in mean diameter with 4 mm solid component. Follow-up non-contrast CT recommended at 3-6 months to confirm persistence. If unchanged, and solid component remains <6 mm, annual CT is recommended until 5 years of stability has been established. If persistent these nodules should be considered highly suspicious if the solid component of the nodule is 6 mm or greater in size and enlarging.  This recommendation follows the consensus statement: Guidelines for Management of Incidental Pulmonary Nodules Detected on CT Images: From the Fleischner Society 2017; Radiology 2017; 284:228-243. 3.  Aortic Atherosclerosis (ICD10-I70.0). Electronically Signed   By: Yetta Glassman M.D.   On: 10/09/2021 15:34   NM Pulmonary Perfusion  Result Date: 10/25/2021 CLINICAL DATA:  Pulmonary embolism suspected. Positive D-dimer. Chest pain. EXAM: NUCLEAR MEDICINE PERFUSION LUNG SCAN TECHNIQUE: Perfusion images were obtained in multiple projections after intravenous injection of radiopharmaceutical. Ventilation scans intentionally deferred if perfusion scan and chest x-ray adequate for interpretation during COVID 19 epidemic. RADIOPHARMACEUTICALS:  4.23 mCi Tc-75mMAA IV COMPARISON:  None. Correlation is made with chest radiographs dated 10/25/2021 and 10/08/2021; CT chest 10/09/2021 FINDINGS: There are multiple moderate perfusion defects within the right middle lobe and right lower lobe. A small portion of these may match the linear scarring within the right mid lung seen on prior CT, however others do not appear to match defects on prior radiographs or CT. This highly raises suspicion for right mid and/or lower lung pulmonary embolism. IMPRESSION: High probability for pulmonary embolism (PE present). Electronically Signed   By: RYvonne KendallM.D.   On: 10/25/2021 13:10   ECHOCARDIOGRAM COMPLETE  Result Date: 10/15/2021    ECHOCARDIOGRAM REPORT   [facility] Medical Rec #:  [id number]        Height:       61.0 in Accession #:    [id number]       Weight:       118.2 lb Date of Birth:  630-Aug-1933        BSA:          1.510 m Patient Age:    89years          BP:           128/70 mmHg Patient Gender: F                 HR:           87 bpm. Exam Location:  Outpatient Procedure: 2D Echo, Cardiac Doppler,  Color Doppler and Strain Analysis Indications:    R07.9* Chest pain,  unspecified; R06.9 DOE; R01.1 Murmur  History:        Patient has prior history of Echocardiogram examinations, most                 recent 07/01/2017. Pulmonary HTN, Arrythmias:PVC, Atrial Flutter                 and PAC, Signs/Symptoms:Chest Pain and Dyspnea; Risk                 Factors:Sleep Apnea and Non-Smoker. Patient has had chest                 pressure with DOE. She denies leg edema. She does have severe                 scoliosis.  Sonographer:    Salvadore Dom RVT, RDCS (AE), RDMS Referring Phys: [place]  1. Mitral regurgitation has advanced since prior study.  2. Left ventricular ejection fraction by 3D volume is 63 %. The left ventricle has normal function. The left ventricle has no regional wall motion abnormalities. Left ventricular diastolic parameters are consistent with Grade II diastolic dysfunction (pseudonormalization). The average left ventricular global longitudinal strain is -12.0 %. The global longitudinal strain is abnormal.  3. Right ventricular systolic function is normal. The right ventricular size is normal. There is mildly elevated pulmonary artery systolic pressure. The estimated right ventricular systolic pressure is 77.9 mmHg.  4. Left atrial size was severely dilated.  5. Right atrial size was moderately dilated.  6. The mitral valve is degenerative. Moderate to severe mitral valve regurgitation. No evidence of mitral stenosis. There is moderate holosystolic prolapse of the middle scallop of the posterior leaflet of the mitral valve.  7. Tricuspid valve regurgitation is moderate.  8. The aortic valve is normal in structure. Aortic valve regurgitation is not visualized. No aortic stenosis is present.  9. The inferior vena cava is dilated in size with <50% respiratory variability, suggesting right atrial pressure of 15 mmHg. Comparison(s): Prior images reviewed side by side. EF 55%, MAC, mild MR in 2018. FINDINGS  Left Ventricle: Left ventricular ejection  fraction by 3D volume is 63 %. The left ventricle has normal function. The left ventricle has no regional wall motion abnormalities. The average left ventricular global longitudinal strain is -12.0 %. The global  longitudinal strain is abnormal. The left ventricular internal cavity size was normal in size. There is no left ventricular hypertrophy. Left ventricular diastolic parameters are consistent with Grade II diastolic dysfunction (pseudonormalization). Right Ventricle: The right ventricular size is normal. No increase in right ventricular wall thickness. Right ventricular systolic function is normal. There is mildly elevated pulmonary artery systolic pressure. The tricuspid regurgitant velocity is 2.96  m/s, and with an assumed right atrial pressure of 3 mmHg, the estimated right ventricular systolic pressure is 39.0 mmHg. Left Atrium: Left atrial size was severely dilated. Right Atrium: Right atrial size was moderately dilated. Pericardium: There is no evidence of pericardial effusion. Mitral Valve: The mitral valve is degenerative in appearance. There is moderate holosystolic prolapse of the middle scallop of the posterior leaflet of the mitral valve. There is moderate thickening of the mitral valve leaflet(s). There is mild calcification of the mitral valve leaflet(s). Mild mitral annular calcification. Moderate to severe mitral valve regurgitation, with anteriorly-directed jet. No evidence of mitral valve stenosis. Tricuspid Valve: The tricuspid valve is normal  in structure. Tricuspid valve regurgitation is moderate . No evidence of tricuspid stenosis. Aortic Valve: The aortic valve is normal in structure. Aortic valve regurgitation is not visualized. No aortic stenosis is present. Aortic valve mean gradient measures 3.0 mmHg. Aortic valve peak gradient measures 5.8 mmHg. Aortic valve area, by VTI measures 2.67 cm. Pulmonic Valve: The pulmonic valve was normal in structure. Pulmonic valve regurgitation is  not visualized. No evidence of pulmonic stenosis. Aorta: The aortic root is normal in size and structure. Venous: The inferior vena cava is dilated in size with less than 50% respiratory variability, suggesting right atrial pressure of 15 mmHg. IAS/Shunts: No atrial level shunt detected by color flow Doppler. Additional Comments: Mitral regurgitation has advanced since prior study.  LEFT VENTRICLE PLAX 2D LVIDd:         4.88 cm         Diastology LVIDs:         3.18 cm         LV e' medial:    5.51 cm/s LV PW:         0.68 cm         LV E/e' medial:  19.2 LV IVS:        0.65 cm         LV e' lateral:   5.18 cm/s LVOT diam:     2.10 cm         LV E/e' lateral: 20.5 LV SV:         58 LV SV Index:   39              2D LVOT Area:     3.46 cm        Longitudinal                                Strain                                2D Strain GLS  -12.0 % LV Volumes (MOD)               Avg: LV vol d, MOD    55.3 ml A2C:                           3D Volume EF LV vol d, MOD    78.3 ml       LV 3D EF:    Left A4C:                                        ventricul LV vol s, MOD    26.1 ml                    ar A2C:                                        ejection LV vol s, MOD    26.4 ml                    fraction A4C:  by 3D LV SV MOD A2C:   29.2 ml                    volume is LV SV MOD A4C:   78.3 ml                    63 %. LV SV MOD BP:    39.1 ml                                 3D Volume EF:                                3D EF:        63 %                                LV EDV:       90 ml                                LV ESV:       34 ml                                LV SV:        56 ml RIGHT VENTRICLE RV S prime:     14.60 cm/s TAPSE (M-mode): 2.6 cm LEFT ATRIUM              Index        RIGHT ATRIUM           Index LA diam:        5.50 cm  3.64 cm/m   RA Area:     17.20 cm LA Vol (A2C):   130.0 ml 86.07 ml/m  RA Volume:   48.10 ml  31.85 ml/m LA Vol (A4C):   53.5 ml  35.42  ml/m LA Biplane Vol: 88.5 ml  58.59 ml/m  AORTIC VALVE                    PULMONIC VALVE AV Area (Vmax):    2.43 cm     PV Vmax:          0.97 m/s AV Area (Vmean):   2.33 cm     PV Peak grad:     3.7 mmHg AV Area (VTI):     2.67 cm     PR End Diast Vel: 6.30 msec AV Vmax:           120.00 cm/s AV Vmean:          73.900 cm/s AV VTI:            0.218 m AV Peak Grad:      5.8 mmHg AV Mean Grad:      3.0 mmHg LVOT Vmax:         84.20 cm/s LVOT Vmean:        49.800 cm/s LVOT VTI:          0.168 m LVOT/AV VTI ratio: 0.77  AORTA Ao Asc diam:  3.50 cm Ao Arch diam: 2.9 cm MITRAL VALVE  TRICUSPID VALVE MV Area (PHT): 2.81 cm     TR Peak grad:   35.0 mmHg MV Decel Time: 270 msec     TR Vmax:        296.00 cm/s MR Peak grad: 76.9 mmHg MR Mean grad: 35.0 mmHg     SHUNTS MR Vmax:      438.50 cm/s   Systemic VTI:  0.17 m MR Vmean:     275.0 cm/s    Systemic Diam: 2.10 cm MR PISA:      3.08 cm MV E velocity: 106.00 cm/s MV A velocity: 80.40 cm/s MV E/A ratio:  1.32 Candee Furbish MD Electronically signed by Candee Furbish MD Signature Date/Time: 10/15/2021/4:05:14 PM    Final    VAS [place] LOWER EXTREMITY VENOUS (DVT)  Result Date: 10/25/2021  Lower Venous DVT Study Patient Name:  DANNIELLE BASKINS [facility]  Date of Exam:   10/25/2021 Medical Rec #: [id number]          Accession #:    [id number] Date of Birth: Apr 20, 1932          Patient Gender: F Patient Age:   5 years Exam Location:  [facility] Procedure:      VAS [place] LOWER EXTREMITY VENOUS (DVT) Referring Phys: MURALI RAMASWAMY --------------------------------------------------------------------------------  Indications: Elevated Ddimer.  Risk Factors: None identified. Limitations: Poor ultrasound/tissue interface, body habitus and patient positioning. Comparison Study: No prior studies. Performing Technologist: Oliver Hum RVT  Examination Guidelines: A complete evaluation includes B-mode imaging, spectral Doppler, color Doppler, and power Doppler as needed of  all accessible portions of each vessel. Bilateral testing is considered an integral part of a complete examination. Limited examinations for reoccurring indications may be performed as noted. The reflux portion of the exam is performed with the patient in reverse Trendelenburg.  +---------+---------------+---------+-----------+----------+--------------+  RIGHT     Compressibility Phasicity Spontaneity Properties Thrombus Aging  +---------+---------------+---------+-----------+----------+--------------+  CFV       Full            Yes       Yes                                    +---------+---------------+---------+-----------+----------+--------------+  SFJ       Full                                                             +---------+---------------+---------+-----------+----------+--------------+  FV Prox   Full                                                             +---------+---------------+---------+-----------+----------+--------------+  FV Mid    Full                                                             +---------+---------------+---------+-----------+----------+--------------+  FV Distal Full                                                             +---------+---------------+---------+-----------+----------+--------------+  PFV       Full                                                             +---------+---------------+---------+-----------+----------+--------------+  POP       Full            Yes       Yes                                    +---------+---------------+---------+-----------+----------+--------------+  PTV       Full                                                             +---------+---------------+---------+-----------+----------+--------------+  PERO      Full                                                             +---------+---------------+---------+-----------+----------+--------------+    +---------+---------------+---------+-----------+----------+--------------+  LEFT      Compressibility Phasicity Spontaneity Properties Thrombus Aging  +---------+---------------+---------+-----------+----------+--------------+  CFV       Full            Yes       Yes                                    +---------+---------------+---------+-----------+----------+--------------+  SFJ       Full                                                             +---------+---------------+---------+-----------+----------+--------------+  FV Prox   Full                                                             +---------+---------------+---------+-----------+----------+--------------+  FV Mid    Full                                                             +---------+---------------+---------+-----------+----------+--------------+  FV Distal Full                                                             +---------+---------------+---------+-----------+----------+--------------+  PFV       Full                                                             +---------+---------------+---------+-----------+----------+--------------+  POP       Full            Yes       Yes                                    +---------+---------------+---------+-----------+----------+--------------+  PTV       Full                                                             +---------+---------------+---------+-----------+----------+--------------+  PERO      Full                                                             +---------+---------------+---------+-----------+----------+--------------+     Summary: RIGHT: - There is no evidence of deep vein thrombosis in the lower extremity.  - No cystic structure found in the popliteal fossa.  LEFT: - There is no evidence of deep vein thrombosis in the lower extremity.  - No cystic structure found in the popliteal fossa.  *See table(s) above for measurements and observations. Electronically signed  by Servando Snare MD on 10/25/2021 at 12:49:06 PM.    Final       No flowsheet data found.  No results found for: NITRICOXIDE      Assessment & Plan:   Pulmonary embolism ([place]) Recently diagnosed pulmonary embolism positive VQ scan.  Patient has recently been started on Eliquis.  Seems to be tolerating.  Unfortunately insurance is not covering well.  We will get our pharmacy team to help [place].  We have also filled out patient assistance paperwork to help to see if this will make it more affordable.  Patient education on anticoagulation.  PE is unprovoked.  May need to consider lifelong anticoagulation or at least for 6 months to 1 year of therapy.  Will need to weigh the risk and benefit ratio.  Plan  Patient Instructions  Continue on Eliquis.  We are going to look into patient assistance.  Avoid NSAIDs -Ibuprofen, aleve, advil, etc.  Saline nasal spray Twice daily   Saline nasal gel At bedtime   Continue on Flutter valve Twice daily  .  Continue on Spiriva 2 puffs daily  Follow up with Dr. Chase Caller in 6-8 weeks and As needed   Please contact office for sooner follow up if symptoms do not improve or worsen or seek emergency care       Bronchiectasis [facility]) Bronchiectasis noted on CT scan.  Patient has some minimum cough but no significant active symptoms.  We will continue with bronchodilator and flutter valve.  Patient education was given Immunoglobulin levels  were okay  Plan  Patient Instructions  Continue on Eliquis.  We are going to look into patient assistance.  Avoid NSAIDs -Ibuprofen, aleve, advil, etc.  Saline nasal spray Twice daily   Saline nasal gel At bedtime   Continue on Flutter valve Twice daily  .  Continue on Spiriva 2 puffs daily  Follow up with Dr. Chase Caller in 6-8 weeks and As needed   Please contact office for sooner follow up if symptoms do not improve or worsen or seek emergency care         Rexene Edison, NP 11/06/2021

## 2021-11-06 NOTE — Patient Instructions (Addendum)
Continue on Eliquis.  We are going to look into patient assistance.  Avoid NSAIDs -Ibuprofen, aleve, advil, etc.  Saline nasal spray Twice daily   Saline nasal gel At bedtime   Continue on Flutter valve Twice daily  .  Continue on Spiriva 2 puffs daily  Follow up with Dr. Chase Caller in 6-8 weeks and As needed   Please contact office for sooner follow up if symptoms do not improve or worsen or seek emergency care

## 2021-11-06 NOTE — Progress Notes (Signed)
Patient seen in the office today and instructed on use of flutter valve and spiriva.  Patient expressed understanding and demonstrated technique.

## 2021-11-08 NOTE — Telephone Encounter (Signed)
Spoke with pt and reviewed Tammy's response as dictated. Pt stated understanding. Nothing further needed at this time.

## 2021-11-08 NOTE — Telephone Encounter (Signed)
Spoke with the pt  She is asking why Tammy did not give her abx at her visit  She is asking if we need to have her get her "home air tested" to see if something in her home caused her lung dz  She also asks if she needs sleep evaluation, bc her ENT had mentioned something about this  Tammy, these are things that she has thought of after her visit  Please advise, thanks

## 2021-11-08 NOTE — Telephone Encounter (Signed)
I did not feel that she needed an antibiotic at the last visit what we wanted to do is increase her mucociliary clearance with flutter valve on Spiriva.  Want to avoid antibiotics if possible unless they are needed for a bacterial infection  As far as her home testing for air she is welcome to contact a local service to evaluate that but I am unfamiliar with that  We can discuss her sleep issues in more detail on a follow-up visit however if she is not having significant restless sleep snoring and daytime sleepiness may not need to proceed with a sleep evaluation at age 88

## 2021-11-11 ENCOUNTER — Telehealth: Payer: Self-pay | Admitting: Internal Medicine

## 2021-11-12 NOTE — Telephone Encounter (Signed)
Paperwork has been faxed for pt. Called and spoke with pt letting her know this had been done and she verbalized understanding. Nothing further needed.

## 2021-11-14 ENCOUNTER — Telehealth: Payer: Self-pay | Admitting: Internal Medicine

## 2021-11-15 NOTE — Telephone Encounter (Signed)
Called patient and she just wanted some insight of the PFT testing. I told her that this is a test that test the function of her lungs and it is a 1 hour long test to help the doctor get a better picture of her lungs.   Nothing further needed

## 2021-11-19 DIAGNOSIS — I700 Atherosclerosis of aorta: Secondary | ICD-10-CM | POA: Diagnosis not present

## 2021-11-19 DIAGNOSIS — I2699 Other pulmonary embolism without acute cor pulmonale: Secondary | ICD-10-CM | POA: Diagnosis not present

## 2021-11-19 DIAGNOSIS — R682 Dry mouth, unspecified: Secondary | ICD-10-CM | POA: Diagnosis not present

## 2021-11-19 DIAGNOSIS — J849 Interstitial pulmonary disease, unspecified: Secondary | ICD-10-CM | POA: Diagnosis not present

## 2021-11-19 DIAGNOSIS — I272 Pulmonary hypertension, unspecified: Secondary | ICD-10-CM | POA: Diagnosis not present

## 2021-11-22 ENCOUNTER — Telehealth: Payer: Self-pay | Admitting: *Deleted

## 2021-11-22 ENCOUNTER — Telehealth: Payer: Self-pay | Admitting: Acute Care

## 2021-11-22 ENCOUNTER — Ambulatory Visit (INDEPENDENT_AMBULATORY_CARE_PROVIDER_SITE_OTHER): Payer: Medicare Other | Admitting: Acute Care

## 2021-11-22 ENCOUNTER — Other Ambulatory Visit: Payer: Self-pay

## 2021-11-22 ENCOUNTER — Ambulatory Visit (INDEPENDENT_AMBULATORY_CARE_PROVIDER_SITE_OTHER): Payer: Medicare Other | Admitting: Internal Medicine

## 2021-11-22 ENCOUNTER — Encounter: Payer: Self-pay | Admitting: Acute Care

## 2021-11-22 VITALS — BP 116/72 | HR 118 | Temp 97.4°F | Ht 61.5 in | Wt 124.0 lb

## 2021-11-22 DIAGNOSIS — I2699 Other pulmonary embolism without acute cor pulmonale: Secondary | ICD-10-CM

## 2021-11-22 DIAGNOSIS — R0609 Other forms of dyspnea: Secondary | ICD-10-CM | POA: Diagnosis not present

## 2021-11-22 DIAGNOSIS — K900 Celiac disease: Secondary | ICD-10-CM | POA: Diagnosis not present

## 2021-11-22 LAB — PULMONARY FUNCTION TEST
DL/VA % pred: 84 %
DL/VA: 3.46 ml/min/mmHg/L
DLCO cor % pred: 61 %
DLCO cor: 10.42 ml/min/mmHg
DLCO unc % pred: 61 %
DLCO unc: 10.42 ml/min/mmHg
FEF 25-75 Post: 1.05 L/sec
FEF 25-75 Pre: 0.88 L/sec
FEF2575-%Change-Post: 19 %
FEF2575-%Pred-Post: 116 %
FEF2575-%Pred-Pre: 97 %
FEV1-%Change-Post: 4 %
FEV1-%Pred-Post: 93 %
FEV1-%Pred-Pre: 89 %
FEV1-Post: 1.36 L
FEV1-Pre: 1.3 L
FEV1FVC-%Change-Post: 5 %
FEV1FVC-%Pred-Pre: 100 %
FEV6-%Change-Post: 0 %
FEV6-%Pred-Post: 95 %
FEV6-%Pred-Pre: 96 %
FEV6-Post: 1.78 L
FEV6-Pre: 1.78 L
FEV6FVC-%Change-Post: 0 %
FEV6FVC-%Pred-Post: 107 %
FEV6FVC-%Pred-Pre: 107 %
FVC-%Change-Post: 0 %
FVC-%Pred-Post: 89 %
FVC-%Pred-Pre: 89 %
FVC-Post: 1.78 L
FVC-Pre: 1.79 L
Post FEV1/FVC ratio: 76 %
Post FEV6/FVC ratio: 100 %
Pre FEV1/FVC ratio: 73 %
Pre FEV6/FVC Ratio: 100 %
RV % pred: 101 %
RV: 2.45 L
TLC % pred: 89 %
TLC: 4.21 L

## 2021-11-22 NOTE — Progress Notes (Signed)
PFT done today. 

## 2021-11-22 NOTE — Patient Instructions (Addendum)
It is good to see you today.  Continue Eliquis 5 mg in the morning and 5 mg  in the evening. Take without fail If you get to where you have 2 tablets left and no new prescription, please call the office.  The office is working on getting her reduced cost for her Eliquis.  Avoid NSAIDs -Ibuprofen, aleve, advil, etc.  Please continue to use your Flutter Valve  Use 4-6  times a day. 2-4 blows at a time. We will add Mucinex 1200 mg once daily with a full glass of water.  This will help to thin secretions so you can cough them up.  Continue Spiriva 2 puffs daily as you have been doing.  Rinse mouth after use.  We will send a referral to [facility]. Dr.Nandegam Celiac Disease and Kyphosis  which is affecting digestion. Follow up in 1 month with Dr, Chase Caller as is scheduled Add Ensure between meals.  Eat smaller more frequent meals.  Please contact office for sooner follow up if symptoms do not improve or worsen or seek emergency care

## 2021-11-22 NOTE — Telephone Encounter (Signed)
[place] at [phone] and spoke with Cristie Hem to see what information they needed from [place]. He said that he was able to gather the missing information that was needed and was going to send it over to be processed. Called and informed patient. Nothing further needed at this time.

## 2021-11-22 NOTE — Telephone Encounter (Signed)
Good afternoon, Ms. Schellinger is a mutual patient of ours, she is on Eliquis 13m twice daily.  We have submitted papwer work for financial assistance through [employer] are awaiting a decision on whether she qualifies.  She has enough to last her until the end of the month.  We do not have acces to samples of Eliquis and our pharmacy has already gotten her approved for the first 30 days for free.  Does your office have access to samples that the patient could pick up so she does not run out?  Please advise.  Thank you.  HNira Conn

## 2021-11-22 NOTE — Progress Notes (Signed)
History of Present Illness Janet Nguyen is a 88 y.o. female never smoker seen for pulmonary consult 10/22/21 for bronchiectasis and shortness of breath. Additionally she was  found to have Pulmonary embolism on VQ scan . PE is unprovoked.  May need to consider lifelong anticoagulation or at least for 6 months to 1 year of therapy.  Will need to weigh the risk and benefit ratio.She is followed by Dr. Chase Nguyen   11/22/2021 Follow Up PE/ Bronchiectasis/ Exertional dyspnea Pt. Presents for follow up . She states she has been doing well. She has been compliant with her  Eliquis. No bleeding.  She does not use NSAIDS. We are working on getting her reduced cost medication through the pharmacy. She understands she cannot skip a dose of Eliquis. .We reviewed bleeding precautions and safe use of sharp objects, in addition to fall precautions. She denies any lower extremity swelling.No bruising noted  She does continue to have exertional dyspnea.  She states she has been compliant with her Spiriva. She states she is unsure if it is helping or not, but is willing to try this for a bit longer.    She is using her flutter valve 8-10 times a day. 2-4 blows at a time.She states she has not been able to cough anything up. We will add Mucinex to see if this helps. We discussed that this is a daily maintenance for her bronchiectasis. She is very compliant. She has had many questions today, that we have addressed.    PFT's look restrictive, which is expected with her degree of scoliosis.  DLCO with mild/Moderate reduction  Lung volumes are slightly reduced No significant response to BD            She has been having some digestive issues. She has a history of Scoliosis and she had been seen previously by Dr. Earlean Nguyen, who has retired. She has not has any follow up care.  I have placed a referral to [facility] for continued care.     Test Results: VQ scan 10/25/21- multiple moderate perfusion  defects with RML/RLL + PE.  Venous Doppler 09/2021 neg  Echo Nml EF , Gr 2 DD, Elevated PAP 86mHg. Severe LAE and Mod RA, mod to severe MR  Labwork  Autoimmune labs did show elevated rheumatoid factor and ANA, CCP was negative. Sjogren's panel was negative  CBC Latest Ref Rng & Units 02/11/2021 04/20/2020 12/05/2017  WBC 4.0 - 10.5 K/uL 6.5 8.7 9.2  Hemoglobin 12.0 - 15.0 g/dL 11.7(L) 12.4 12.0  Hematocrit 36.0 - 46.0 % 35.0(L) 37.9 36.0  Platelets 150 - 400 K/uL 237 267 288    BMP Latest Ref Rng & Units 10/09/2021 02/11/2021 12/05/2017  Glucose 70 - 99 mg/dL - 102(H) 129(H)  BUN 8 - 23 mg/dL - 13 13  Creatinine 0.44 - 1.00 mg/dL 1.10(H) 0.89 0.87  Sodium 135 - 145 mmol/L - 134(L) 136  Potassium 3.5 - 5.1 mmol/L - 4.1 3.8  Chloride 98 - 111 mmol/L - 101 100(L)  CO2 22 - 32 mmol/L - 29 26  Calcium 8.9 - 10.3 mg/dL - 9.9 9.6    BNP No results found for: BNP  ProBNP    Component Value Date/Time   PROBNP 178.6 11/29/2012 1736    PFT    Component Value Date/Time   FEV1PRE 1.30 10/22/2021 1126   FEV1POST 1.36 10/22/2021 1126   FVCPRE 1.79 10/22/2021 1126   FVCPOST 1.78 10/22/2021 1126   TLC 4.21 10/22/2021 1126  DLCOUNC 10.42 10/22/2021 1126   PREFEV1FVCRT 73 10/22/2021 1126   PSTFEV1FVCRT 76 10/22/2021 1126    DG Chest 2 View  Result Date: 10/25/2021 CLINICAL DATA:  Elevated D-dimer EXAM: CHEST - 2 VIEW COMPARISON:  Chest x-ray 10/08/2021 FINDINGS: Heart is mildly enlarged. Mediastinum appears stable. Calcified plaques in the aortic arch and tortuosity of the thoracic aorta. Lungs are hyperinflated with prominent interstitial lung markings. Linear band of platelike atelectasis or scarring in the right mid lung zone. No pleural effusion or pneumothorax. IMPRESSION: 1. COPD 2. Mild cardiomegaly. Electronically Signed   By: Janet Nguyen M.D.   On: 10/25/2021 12:20   NM Pulmonary Perfusion  Result Date: 10/25/2021 CLINICAL DATA:  Pulmonary embolism suspected. Positive  D-dimer. Chest pain. EXAM: NUCLEAR MEDICINE PERFUSION LUNG SCAN TECHNIQUE: Perfusion images were obtained in multiple projections after intravenous injection of radiopharmaceutical. Ventilation scans intentionally deferred if perfusion scan and chest x-ray adequate for interpretation during COVID 19 epidemic. RADIOPHARMACEUTICALS:  4.23 mCi Tc-51mMAA IV COMPARISON:  None. Correlation is made with chest radiographs dated 10/25/2021 and 10/08/2021; CT chest 10/09/2021 FINDINGS: There are multiple moderate perfusion defects within the right middle lobe and right lower lobe. A small portion of these may match the linear scarring within the right mid lung seen on prior CT, however others do not appear to match defects on prior radiographs or CT. This highly raises suspicion for right mid and/or lower lung pulmonary embolism. IMPRESSION: High probability for pulmonary embolism (PE present). Electronically Signed   By: RYvonne KendallM.D.   On: 10/25/2021 13:10   VAS [place]OWER EXTREMITY VENOUS (DVT)  Result Date: 10/25/2021  Lower Venous DVT Study [name] Date of Exam:   10/25/2021 Medical Rec #: [id number]         Accession #:    [id number] of Birth: 612/29/1933         Patient Gender: F Patient Age:   89years [facility]      V[facility] [place]OWER EXTREMITY VENOUS (DVT) Referring Phys: Janet Nguyen --------------------------------------------------------------------------------  Indications: Elevated Ddimer.  Risk Factors: None identified. Limitations: Poor ultrasound/tissue interface, body habitus and patient positioning. Comparison Study: No prior studies. Performing Technologist: Janet Nguyen  Examination Guidelines: A complete evaluation includes B-mode imaging, spectral Doppler, color Doppler, and power Doppler as needed of all accessible portions of each vessel. Bilateral testing is considered an integral part of a complete examination. Limited  examinations for reoccurring indications may be performed as noted. The reflux portion of the exam is performed with the patient in reverse Trendelenburg.  +---------+---------------+---------+-----------+----------+--------------+  RIGHT     Compressibility Phasicity Spontaneity Properties Thrombus Aging  +---------+---------------+---------+-----------+----------+--------------+  CFV       Full            Yes       Yes                                    +---------+---------------+---------+-----------+----------+--------------+  SFJ       Full                                                             +---------+---------------+---------+-----------+----------+--------------+  FV Prox   Full                                                             +---------+---------------+---------+-----------+----------+--------------+  FV Mid    Full                                                             +---------+---------------+---------+-----------+----------+--------------+  FV Distal Full                                                             +---------+---------------+---------+-----------+----------+--------------+  PFV       Full                                                             +---------+---------------+---------+-----------+----------+--------------+  POP       Full            Yes       Yes                                    +---------+---------------+---------+-----------+----------+--------------+  PTV       Full                                                             +---------+---------------+---------+-----------+----------+--------------+  PERO      Full                                                             +---------+---------------+---------+-----------+----------+--------------+   +---------+---------------+---------+-----------+----------+--------------+  LEFT      Compressibility Phasicity Spontaneity Properties Thrombus Aging   +---------+---------------+---------+-----------+----------+--------------+  CFV       Full            Yes       Yes                                    +---------+---------------+---------+-----------+----------+--------------+  SFJ       Full                                                             +---------+---------------+---------+-----------+----------+--------------+  FV Prox   Full                                                             +---------+---------------+---------+-----------+----------+--------------+  FV Mid    Full                                                             +---------+---------------+---------+-----------+----------+--------------+  FV Distal Full                                                             +---------+---------------+---------+-----------+----------+--------------+  PFV       Full                                                             +---------+---------------+---------+-----------+----------+--------------+  POP       Full            Yes       Yes                                    +---------+---------------+---------+-----------+----------+--------------+  PTV       Full                                                             +---------+---------------+---------+-----------+----------+--------------+  PERO      Full                                                             +---------+---------------+---------+-----------+----------+--------------+     Summary: RIGHT: - There is no evidence of deep vein thrombosis in the lower extremity.  - No cystic structure found in the popliteal fossa.  LEFT: - There is no evidence of deep vein thrombosis in the lower extremity.  - No cystic structure found in the popliteal fossa.  *See table(s) above for measurements and observations. Electronically signed by Servando Snare MD on 10/25/2021 at 12:49:06 PM.    Final      Past medical hx Past Medical History:  Diagnosis Date   Arrhythmia    History of  SVT with documented PVC'S and  PAC'S  12/08/12 Nuc stress test normal LV EF 74%  Event Monitor  12/01/12-01/03/13   Atrial flutter ([place])  Celiac disease    treated by Dr. Earlean Nguyen   GERD (gastroesophageal reflux disease)    Intervertebral disc stenosis of neural canal of cervical region    Irregular heart beat 11/30/12   ECHO-EF 60-65%   Osteoporosis    PMR (polymyalgia rheumatica) (HCC)    Dr. Marijean Bravo; pt states she was diagnosed 10-15 years ago, not treated at this time or any issues that she is aware of.   Scoliosis    Scoliosis    Sleep apnea 10/02/11 [facility]   Sleep study AHI -total sleep 10.3/hr  64.0/ hr during REM sleep.RDI 22.8/hr during total sleep 64.0/hr during REM sleep The lowest O2 sat during Non-REM and REM sleep was 86% and 88% respectively. 04/08/12 CPAP/BIPAP titration study [facility]     Social History   Tobacco Use   Smoking status: Never   Smokeless tobacco: Never  Vaping Use   Vaping Use: Never used  Substance Use Topics   Alcohol use: Yes    Alcohol/week: 1.0 standard drink    Types: 1 Standard drinks or equivalent per week    Comment: drink ETOH socially   Drug use: No    Ms.Lenhardt reports that she has never smoked. She has never used smokeless tobacco. She reports current alcohol use of about 1.0 standard drink per week. She reports that she does not use drugs.  Tobacco Cessation: Never smoker   Past surgical hx, Family hx, Social hx all reviewed.  Current Outpatient Medications on File Prior to Visit  Medication Sig   ALPRAZolam (XANAX) 0.5 MG tablet Take 0.5 mg by mouth as needed.   amLODipine (NORVASC) 5 MG tablet TAKE ONE TABLET BY MOUTH DAILY   Apixaban Starter Pack, 58m and 53m (ELIQUIS DVT/PE STARTER PACK) Take as directed on package: start with two-45m24mablets twice daily for 7 days. On day 8, switch to one-45mg21mblet twice daily.   Biotin 1000 MCG tablet    Cholecalciferol (VITAMIN D3) 50 MCG  (2000 UT) capsule    clidinium-chlordiazePOXIDE (LIBRAX) 5-2.5 MG capsule Take 1 capsule by mouth as needed.   EQ ACETAMINOPHEN PO Take 500 mg by mouth as needed (pain).   FLUTICASONE PROPIONATE, NASAL, NA Place into the nose daily as needed (rhinitis).   metoprolol succinate (TOPROL-XL) 50 MG 24 hr tablet TAKE ONE AND ONE HALF TABLETS BY MOUTH TWO TIMES A DAY   Multiple Vitamins-Minerals (PRESERVISION AREDS 2 PO) Take 1 tablet by mouth every other day.   Omega 3 1000 MG CAPS    Phenylephrine-APAP-guaiFENesin (EQ SINUS CONGESTION & PAIN PO) Take by mouth as needed (for sinus pain). Contains acetaminophen 325 mg and Phenylephrine 5 mg   Tiotropium Bromide Monohydrate (SPIRIVA RESPIMAT) 1.25 MCG/ACT AERS Inhale 2 puffs into the lungs daily.   traMADol (ULTRAM) 50 MG tablet Take 50 mg by mouth 2 (two) times daily as needed.   UNABLE TO FIND Take by mouth. Med Name: VITAFUSION WOMEN'S GUMMRosedale Name: VITALivoniaCEMirandaFIND Med Name: VITAFUSION OMEGA-3, ONCE-TWICE DAILY   UNABLE TO FIND 1,000 mcg daily. Med Name: SPRICalhounFIND 2 (two) times daily. Med Name: EQUATE FIBER POWDER/MIRALAX   UNABLE TO FIND as needed (itching ears). Med Name: FLUOCINONIDE TOPICAL SOLUTION (DROPS)   ZOLPIDEM TARTRATE ER PO Take 2.5 mg by mouth at bedtime.   No current facility-administered medications on file prior  to visit.     Allergies  Allergen Reactions   Gluten Meal     Unknown   Naproxen     Stomach upset   Codeine Nausea Only    Review Of Systems:  Constitutional:   +  weight loss, No night sweats,  Fevers, chills, fatigue, or  lassitude.  HEENT:   No headaches,  Difficulty swallowing,  Tooth/dental problems, or  Sore throat,                No sneezing, itching, ear ache, nasal congestion, post nasal drip,   CV:  No chest pain,  Orthopnea, PND, swelling in lower extremities, anasarca,  dizziness, palpitations, syncope.   GI  No heartburn, indigestion, abdominal pain, nausea, vomiting, diarrhea, change in bowel habits, loss of appetite, bloody stools.   Resp: + shortness of breath with exertion less at rest.  No excess mucus, no productive cough,  No non-productive cough,  No coughing up of blood.  No change in color of mucus.  No wheezing.  No chest wall deformity  Skin: no rash or lesions.  GU: no dysuria, change in color of urine, no urgency or frequency.  No flank pain, no hematuria   MS:  No joint pain or swelling.  No decreased range of motion.  No back pain.  Psych:  No change in mood or affect. No depression or anxiety.  No memory loss.   Vital Signs BP 116/72 (BP Location: Right Arm, Patient Position: Sitting, Cuff Size: Normal)    Pulse (!) 118    Temp (!) 97.4 F (36.3 C) (Oral)    Ht 5' 1.5" (1.562 m)    Wt 124 lb (56.2 kg)    SpO2 97%    BMI 23.05 kg/m    Physical Exam:  General- No distress,  A&Ox3, pleasant ENT: No sinus tenderness, TM clear, pale nasal mucosa, no oral exudate,no post nasal drip, no LAN Cardiac: S1, S2, regular rate and rhythm, no murmur Chest: No wheeze/ rales/ dullness; no accessory muscle use, no nasal flaring, no sternal retractions, diminished per bases Abd.: Soft Non-tender, ND, BS +, Body mass index is 23.05 kg/m. Ext: No clubbing cyanosis, edema Neuro:  normal strength, MAE x 4, A&&O x 3 Skin: No rashes, warm and dry Psych: normal mood and behavior   Assessment/Plan Non-Provoked Pulmonary Embolism  Eliquis Use Bronchiectasis Weight loss Restrictive lung disease Plan Continue Eliquis 5 mg in the morning and 5 mg  in the evening. Take without fail If you get to where you have 2 tablets left and no new prescription, please call the office.  The office is working on getting her reduced cost for her Eliquis.  Avoid NSAIDs -Ibuprofen, aleve, advil, etc.  Please continue to use your Flutter Valve  Use 4-6  times a day.  2-4 blows at a time. We will add Mucinex 1200 mg once daily with a full glass of water.  This will help to thin secretions so you can cough them up.  Continue Spiriva 2 puffs daily as you have been doing.  Rinse mouth after use.  We will send a referral to [facility]. Dr.Nandegam Celiac Disease and Scoliosis   which is affecting digestion. Follow up in 1 month with Dr. Chase Nguyen as is scheduled Call us if you need [place] sooner. Add Ensure to your diet between meals. This is ok to take with Eliquis  Will need a heart echo in June or sooner to re-evaluate RV.  CBC at next  OV 3/23  I spent an extensive period of time educating the patient today on bronchiectasis/ PE/ Anticoagulation/ Restrictive disease  I spent 50  minutes dedicated to the care of this patient on the date of this encounter to include pre-visit review of records, face-to-face time with the patient discussing conditions above, post visit ordering of testing, clinical documentation with the electronic health record, making appropriate referrals as documented, and communicating necessary information to the patient's healthcare team.   Magdalen Spatz, NP 11/22/2021  1:21 PM

## 2021-11-22 NOTE — Telephone Encounter (Signed)
[facility] there was a long hold time.  I sent an urgent message to her cardiologist Dr. Shelva Majestic to see if their office has samples of Eliquis.  I will await their response. See other phone note for outcome.

## 2021-11-22 NOTE — Telephone Encounter (Signed)
Called [phone] to check the status of her application.  On the first attempt, I was told they could not hear me before I even said anything and then disconnected the call.  I called back and spoke with Guerry Minors, she connected me with the patient assistance representative, Ms. Marland Kitchen.  She states they are missing the collaborating MD information.  I provided her with Dr. Golden Pop first and last name and NPI #.  She stated she would submit the information and it would take 3-5 business days to process.    I called and spoke with the patient, gave her an update on the application process.  She states she still has a supply of her Eliquis, she was given a months supply.  She has a PFT and f/u with Eric Form NP today.  Will continue to f/u on application process.  Will call back on 11/29/21 to f/u.  Will leave open.

## 2021-11-26 ENCOUNTER — Other Ambulatory Visit (HOSPITAL_COMMUNITY): Payer: Self-pay

## 2021-11-26 ENCOUNTER — Telehealth: Payer: Self-pay | Admitting: Acute Care

## 2021-11-26 NOTE — Telephone Encounter (Signed)
Okay I do not know what to do if there are no samples of Eliquis.  I suppose she will have to do warfarin if she cannot pay the amount.  Please talk to her.  Bleeding risk is generally higher with warfarin and it requires more monitoring.

## 2021-11-26 NOTE — Telephone Encounter (Signed)
We do not have samples of Eliquis.   MR, please see below message and advise. Thanks

## 2021-11-26 NOTE — Telephone Encounter (Signed)
Spoke to [place] with [employer]. She stated that application was denied, as patient did not meet out of pocket expense.  Patient is concerned about this because as she only has two days left of Eliquis and she can not afford Rx.   Pharmacy team, do you guys have any recommendations?

## 2021-11-26 NOTE — Telephone Encounter (Signed)
Spoke to patient and relayed below message.  She is concerned about starting Warfarin. She would like to speak to pharmacy about Eliquis deductible. She will call back with update.

## 2021-11-26 NOTE — Telephone Encounter (Signed)
Samples? [employer] is the only patient assistance foundation that I found. Alternative medications could be Xarelto returning a copay of $552, and Warfarin returns a copay of $8, but not sure if that would help with her therapy. Unfortunately I can't find or think of any other options available as the patient still has remaining deductible of $505. Even with GoodRx Eliquis lowest is $551.10 through [facility], compared to insurance copay of 577.76.

## 2021-11-27 NOTE — Telephone Encounter (Signed)
Pt does have an appt with MR tomorrow 3/2 and this could be further discussed during OV. ?

## 2021-11-28 ENCOUNTER — Other Ambulatory Visit: Payer: Self-pay

## 2021-11-28 ENCOUNTER — Ambulatory Visit (INDEPENDENT_AMBULATORY_CARE_PROVIDER_SITE_OTHER): Payer: Medicare Other | Admitting: Internal Medicine

## 2021-11-28 ENCOUNTER — Encounter: Payer: Self-pay | Admitting: Internal Medicine

## 2021-11-28 ENCOUNTER — Telehealth: Payer: Self-pay | Admitting: Internal Medicine

## 2021-11-28 VITALS — BP 108/72 | HR 84 | Temp 97.8°F | Ht 61.5 in | Wt 127.4 lb

## 2021-11-28 DIAGNOSIS — R0609 Other forms of dyspnea: Secondary | ICD-10-CM

## 2021-11-28 DIAGNOSIS — I2699 Other pulmonary embolism without acute cor pulmonale: Secondary | ICD-10-CM

## 2021-11-28 MED ORDER — APIXABAN 5 MG PO TABS: 5.0000 mg | ORAL_TABLET | Freq: Two times a day (BID) | ORAL | 5 refills | Status: DC

## 2021-11-28 MED ORDER — SPIRIVA RESPIMAT 1.25 MCG/ACT IN AERS: 2.0000 | INHALATION_SPRAY | Freq: Every day | RESPIRATORY_TRACT | 0 refills | Status: DC

## 2021-11-28 NOTE — Progress Notes (Signed)
OV 10/22/2021  Subjective:  Patient ID: Janet Nguyen, female , DOB: Sep 05, 1932 , age 88 y.o. , MRN: [id number] , ADDRESS: [place] [id number] PCP Lawerance Cruel, MD Patient Care Team: Lawerance Cruel, MD as PCP - General (Family Medicine) Troy Sine, MD as PCP - Cardiology (Cardiology)  This Provider for this visit: Treatment Team:  Attending Provider: Brand Males, MD    10/22/2021 -   Chief Complaint  Patient presents with   Consult    Pt had a recent CT performed which is the reason for today's visit.     HPI Janet Nguyen 88 y.o. -referred for shortness of breath and cough.  Has known severe scoliosis.  Found to have bronchiectasis on the CT chest.  History is provided by the patient and review of the medical records.  She is a very good historian.  She is divorced.  She has grown children who all live all over the country.  She has several grandchildren.  She says she is known to have deviated nasal septum, celiac disease for which she is on a restrictive diet, sleep apnea but she cannot tolerate CPAP.  She is most importantly known to have severe scoliosis that was first diagnosed in her younger years.  She says after age 50 is around when they made the diagnosis although she suspect she has had a longer period she is just on observation treatment.  She did yoga and severe exercise to keep herself physically fit.  Over the years scoliosis is gotten worse that she is lost 6 inches in total height.  This is producing physical pressure effects.  She thinks with the onset of the pandemic and particularly with worsening of the scoliosis and particularly for the last few months or so she said dyspnea on exertion [echo did show grade 2 diastolic dysfunction], decreased socializing, decreased sleep, feeling depressed.  Also she feels her migraines are worse.  She when she wakes up in the morning she feels an elephant is on the chest.   She also has a cough.  The respiratory symptoms are mild overall.  But she does definitely have it and is definitely new.  Sometime back a year ago or 2 years ago she is to be able to walk at least a mile now she gets dyspneic walking a block.  Of note she reports a few episodes of severe vomiting acute episodes 2 times in 2022.  Idiopathic.  There is associated heartburn  She has had the COVID-vaccine but does not have the COVID itself  Review of system positive for fatigue for the last several months arthralgia for the last several years.  Denies any family history of lung disease  Denies any substance abuse  Lives in a 86 year old home.  Detailed review shows no organic dust antigen exposure.  She does have a bird feeder.  Occupational history: Detail organic and inorganic antigen exposure history is negative    CT Chest data 10/09/21 -    IMPRESSION: 1. 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 Chronic aspiration could have a similar appearance. 2. Part solid nodule of the right lower lobe measuring 7.5 mm in mean diameter with 4 mm solid component. Follow-up non-contrast CT recommended at 3-6 months to confirm persistence.  If unchanged, and solid component remains <6 mm, annual CT is recommended until 5 years of stability has been established. If persistent these nodules should be considered highly suspicious if the solid component of the nodule is 6 mm or greater in size and enlarging. This recommendation follows the consensus statement: Guidelines for Management of Incidental Pulmonary Nodules Detected on CT Images: From the Fleischner Society 2017; Radiology 2017; 284:228-243. 3.  Aortic Atherosclerosis (ICD10-I70.0).     Electronically Signed   By: Yetta Glassman  M.D.   On: 10/09/2021 15:34  No results found.   11/06/2021 Follow up : PE   88 yo female never smoker seen for pulmonary consult 10/22/21 for bronchiectasis and shortness of breath found to have Pulmonary embolism on VQ scan .  Medical history of severe scoliosis /kyphosis , OSA -CPAP intolerant   TEST/EVENTS :  VQ scan 10/25/21- multiple moderate perfusion defects with RML/RLL + PE.  Venous Doppler 09/2021 neg  Echo Nml EF , Gr 2 DD, Elevated PAP 76mHg. Severe LAE and Mod RA, mod to severe MR   Patient returns for a 1 week follow up . Recently seen for pulmonary consult for bronchiectasis and shortness of breath. Labs showed elevated  D Dimer . Subsequent VQ scan on 10/25/21 was positive for PE. 2 D echo showed mild elevated PAP at 357mg, Gr 2 DD , Severe LAE and Mod RA, mod to severe MR. Venous doppler was negative for DVT.  Has some minimally productive dry cough. Was started on Spiriva but did not take.  She was started on Eliquis , she has started on starter pack .  Unfortunately patient's insurance does not cover and prescription is greater than $600.  We discussed patient assistance program.  We have also contacted our pharmacy department to help [place] alternative options.  Patient education on anticoagulation therapy.  Patient says she has some minimum daily cough.  She was started on Spiriva last visit.  Unfortunately did not start this.  We discussed the reason for Spiriva.  Went over her recent CT chest that shows some bronchiectasis.  Autoimmune labs did show elevated rheumatoid factor and ANA, CCP was negative.  Patient has some mild arthritis but no significant joint swelling.  Sjogren's panel was negative. Typically active with light walking. Limited with back pain with scoliosis.  Lives alone. Drives. Does light housework , shopping. Has 4 kids, lots of grandkids.   11/22/2021 Follow Up PE/ Bronchiectasis/ Exertional dyspnea  Janet TRAISTERs a [age].o. female never  smoker seen for pulmonary consult 10/22/21 for bronchiectasis and shortness of breath. Additionally she was  found to have Pulmonary embolism on VQ scan . PE is unprovoked.  May need to consider lifelong anticoagulation or at least for 6 months to 1 year of therapy.  Will need to weigh the risk and benefit ratio.She is followed by Dr. RaChase Caller Pt. Presents for follow up . She states she has been doing well. She has been compliant with her  Eliquis. No bleeding.  She does not use NSAIDS. We are working on getting her reduced cost medication through the pharmacy. She understands she cannot skip a dose of Eliquis. .We reviewed bleeding precautions and safe use of sharp objects, in addition to fall precautions. She denies any lower extremity swelling.No bruising noted  She does continue to have exertional dyspnea.  She states she has been compliant with her Spiriva. She states she is unsure if it is helping or not, but  is willing to try this for a bit longer.    She is using her flutter valve 8-10 times a day. 2-4 blows at a time.She states she has not been able to cough anything up. We will add Mucinex to see if this helps. We discussed that this is a daily maintenance for her bronchiectasis. She is very compliant. She has had many questions today, that we have addressed.    PFT's look restrictive, which is expected with her degree of scoliosis.  DLCO with mild/Moderate reduction  Lung volumes are slightly reduce  OV 11/28/2021  Subjective:  Patient ID: Janet Nguyen, female , DOB: Jul 24, 1932 , age 89 y.o. , MRN: [id number] , ADDRESS: [place] [id number] PCP Lawerance Cruel, MD Patient Care Team: Lawerance Cruel, MD as PCP - General (Family Medicine) Troy Sine, MD as PCP - Cardiology (Cardiology)  This Provider for this visit: Treatment Team:  Attending Provider: Brand Males, MD    11/28/2021 -   Chief Complaint  Patient presents with    Follow-up    Pt states she has been having problems with the cost of the Eliquis medication. States she took her last pill today 3/2. States that she has been more SOB than before.   #Multifactorial dyspnea  -Scoliosis, bronchiectasis seen on CT scan, mitral valve regurgitation grade 2 diastolic dysfunction and pulmonary embolism January 2023   #pulm embolism diagnosed January 2023 through VQ scan and high D-dimer  -On outpatient Eliquis  #Bronchiectasis diagnosis given genera 2023  -On Spiriva and flutter valve  #Right lower lobe lung nodule new 7.5 mm January 2023  -On surveillance  #Positive rheumatoid factor new diagnosis January 2023  -On surveillance   HPI Janet Nguyen 88 y.o. -returns for follow-up.  I saw her in January 2023.  At that time diagnosis of pulmonary embolism was made.  She is here for follow-up.  Since then she is seen nurse practitioner.  She is taking Eliquis.  Recently today she is her last day of Eliquis.  She needs to pay for Eliquis.  It is over $500.  She is struggle to come to this decision.  Pharmacy team has been in communication with me.  We had a conversation.  At first she said she did not want to pay for the Eliquis.  It appears to have done extensive research and only warfarin would be cheap and affordable.  We went through the relative risk can inconvenience features between DOAC and Warfarin.  She then decided to go with DOAC and making the more expensive payment.  She then also discussed the difference between Eliquis and Xarelto.  She believes Xarelto could be risky compared to Eliquis.  So she decided to stick with Eliquis but she asked the pharmacy team to investigate and give her an updated information.    She does have other complaints.  She is concerned about her carotid artery.  In 2017 she might of had some mild carotid stenosis according to the ultrasound report.  I asked her to follow-up with primary care physician.  She wanted to discuss  cardiac issues of mitral valve regurgitation and grade 2 diastolic dysfunction.  Have asked her to discuss this with Dr. [name] but did indicate that it is contributing to shortness of breath.  She has positive rheumatoid factor and we said we will address it at a future visit.  She does have lung nodules and she is due for a follow-up CT  in the spring 2023.  She has bronchiectasis and she said the Spiriva and flutter valve might not be helping but for now she is going to continue those.    CT Chest data  No results found.    PFT  PFT Results Latest Ref Rng & Units 10/22/2021  FVC-Pre L 1.79  FVC-Predicted Pre % 89  FVC-Post L 1.78  FVC-Predicted Post % 89  Pre FEV1/FVC % % 73  Post FEV1/FCV % % 76  FEV1-Pre L 1.30  FEV1-Predicted Pre % 89  FEV1-Post L 1.36  DLCO uncorrected ml/min/mmHg 10.42  DLCO UNC% % 61  DLCO corrected ml/min/mmHg 10.42  DLCO COR %Predicted % 61  DLVA Predicted % 84  TLC L 4.21  TLC % Predicted % 89  RV % Predicted % 101       has a past medical history of Arrhythmia, Atrial flutter (HCC), Celiac disease, GERD (gastroesophageal reflux disease), Intervertebral disc stenosis of neural canal of cervical region, Irregular heart beat (11/30/12), Osteoporosis, PMR (polymyalgia rheumatica) ([place]), Scoliosis, Scoliosis, and Sleep apnea (10/02/11 [facility]).   reports that she has never smoked. She has never used smokeless tobacco.  Past Surgical History:  Procedure Laterality Date   APPENDECTOMY     ruptured at age 53 and had surgery   CARDIAC CATHETERIZATION  01/27/06   cataract surgery  2015   Dr. Herbert Deaner; March & April 2015    Allergies  Allergen Reactions   Gluten Meal     Unknown   Naproxen     Stomach upset   Codeine Nausea Only    Immunization History  Administered Date(s) Administered   Influenza Split 06/26/2014   Influenza, High Dose Seasonal PF 06/15/2015, 06/19/2016, 07/24/2017, 05/24/2019, 06/20/2021    Influenza,inj,quad, With Preservative 06/29/2018   Influenza-Unspecified 06/30/2013   PFIZER(Purple Top)SARS-COV-2 Vaccination 10/14/2019, 11/04/2019, 07/18/2020, 07/01/2021   Pneumococcal Conjugate-13 03/09/2014   Tdap 03/21/2009, 05/29/2021    Family History  Problem Relation Age of Onset   Breast cancer Mother    Heart disease Father    Migraines Neg Hx      Current Outpatient Medications:    ALPRAZolam (XANAX) 0.5 MG tablet, Take 0.5 mg by mouth as needed., Disp: , Rfl:    amLODipine (NORVASC) 5 MG tablet, TAKE ONE TABLET BY MOUTH DAILY, Disp: 90 tablet, Rfl: 3   apixaban (ELIQUIS) 5 MG TABS tablet, Take 1 tablet (5 mg total) by mouth 2 (two) times daily., Disp: 60 tablet, Rfl: 5   Biotin w/ Vitamins C & E (HAIR SKIN & NAILS GUMMIES PO), Take by mouth., Disp: , Rfl:    Calcium Carb-Ergocalciferol (CHEWABLE CALCIUM/D PO), Take by mouth., Disp: , Rfl:    Cholecalciferol (D3 2000) 50 MCG (2000 UT) CAPS, Take by mouth., Disp: , Rfl:    EQ ACETAMINOPHEN PO, Take 500 mg by mouth as needed (pain)., Disp: , Rfl:    FLUTICASONE PROPIONATE, NASAL, NA, Place into the nose daily as needed (rhinitis)., Disp: , Rfl:    metoprolol succinate (TOPROL-XL) 50 MG 24 hr tablet, TAKE ONE AND ONE HALF TABLETS BY MOUTH TWO TIMES A DAY, Disp: 270 tablet, Rfl: 3   Multiple Vitamins-Minerals (HM MULTIVITAMIN ADULT GUMMY) CHEW, Chew by mouth., Disp: , Rfl:    Omega-3 Fatty Acids ([employer] OMEGA-3 GUMMY FISH PO), Take by mouth., Disp: , Rfl:    Phenylephrine-APAP-guaiFENesin (EQ SINUS CONGESTION & PAIN PO), Take by mouth as needed (for sinus pain). Contains acetaminophen 325 mg and Phenylephrine 5 mg, Disp: ,  Rfl:    polyethylene glycol powder (MIRALAX) 17 GM/SCOOP powder, Take 1 Container by mouth once., Disp: , Rfl:    Tiotropium Bromide Monohydrate (SPIRIVA RESPIMAT) 1.25 MCG/ACT AERS, Inhale 2 puffs into the lungs daily., Disp: 4 g, Rfl: 0   traMADol (ULTRAM) 50 MG tablet, Take 50 mg by mouth 2 (two) times daily  as needed., Disp: , Rfl:    ZOLPIDEM TARTRATE ER PO, Take 2.5 mg by mouth at bedtime., Disp: , Rfl:    Biotin 1000 MCG tablet, , Disp: , Rfl:       Objective:   Vitals:   11/28/21 1120  BP: 108/72  Pulse: 84  Temp: 97.8 F (36.6 C)  TempSrc: Oral  SpO2: 97%  Weight: 127 lb 6.4 oz (57.8 kg)  Height: 5' 1.5" (1.562 m)    Estimated body mass index is 23.68 kg/m as calculated from the following:   Height as of this encounter: 5' 1.5" (1.562 m).   Weight as of this encounter: 127 lb 6.4 oz (57.8 kg).  @WEIGHTCHANGE @  [employer]   11/28/21 1120  Weight: 127 lb 6.4 oz (57.8 kg)     Physical Exam  General: No distress. Looks well Neuro: Alert and Oriented x 3. GCS 15. Speech normal Psych: Pleasant Resp:  Barrel Chest - no.  Wheeze - no, Crackles - no, No overt respiratory distress CVS: Normal heart sounds. Murmurs - no Ext: Stigmata of Connective Tissue Disease - no HEENT: Normal upper airway. PEERL +. No post nasal drip        Assessment:       ICD-10-CM   1. Pulmonary embolism, other, unspecified chronicity, unspecified whether acute cor pulmonale present ([place])  I26.99     2. DOE (dyspnea on exertion)  R06.09          Plan:     Patient Instructions  DOE (dyspnea on exertion)   - scoliosis, bronchiectasis, mitral valve leak and and grade 2 heart muscle stiffness contributing to respiratory symptoms   Plan -Supportive care - Address individual components  #Pulmonary embolism diagnosed January 2023   -Completed free samples of Eliquis.  Long discussion about various alternatives.  We took a shared decision making that you will pay expensive $1000 and afford Eliquis.   Plan  -Pharmacy to process your Eliquis -CMA to inform the pharmacist - Pharmacist to investigate if there is any blackbox warnings on E Xarelto -Shared decision making against warfarin  #Bronchiectasis  -Appears.  Spiriva and flutter valve might not be contributing to any  benefit   Plan  -Continue Spiriva and flutter valve for the moment but if you want to stop that is fine and we can monitor  Right lower lobe lung nodule 7.74m on CT 10/09/21 - new  - low risk for lung cancer   Plan  - repeat CT chest without contrast end of April 2023 (3 months) -  Positive rheumatoid factor January 2023   Plan  - At some point we can discuss visiting rheumatologist but hold off for the moment   Mitral valve insufficiency, unspecified etiology Grade II diastolic dysfunction  - also definitely contributing to shortness of breath  Plan' - please d/w Dr KClaiborne Billings Other issues - Concerns on carotid, mood -please discuss with primary care physician within  Followup -April 2023 after CT scan of the chest [okay to see nurse practitioner]  ( Level 05 visit: Estb 40-54 min in  visit type: on-site physical face to visit  in total  care time and counseling or/and coordination of care by this undersigned MD - Dr Brand Males. This includes one or more of the following on this same day 11/28/2021: pre-charting, chart review, note writing, documentation discussion of test results, diagnostic or treatment recommendations, prognosis, risks and benefits of management options, instructions, education, compliance or risk-factor reduction. It excludes time spent by the [place] or office staff in the care of the patient. Actual time 32 min)   SIGNATURE    Dr. Brand Males, M.D., F.C.C.P,  Pulmonary and Critical Care Medicine Staff Physician, [place] Director - Interstitial Lung Disease  Program  Pulmonary [place] at [place], [place], [address]  Pager: [phone], If no answer or between  15:00h - 7:00h: call [phone] Telephone: [phone]  12:53 PM 11/28/2021

## 2021-11-28 NOTE — Telephone Encounter (Signed)
Pt came in today 3/2 for a follow up appt. Info from appt: ? ?#Pulmonary embolism diagnosed January 2023 ?  ? -Completed free samples of Eliquis.  Long discussion about various alternatives.  We took a shared decision making that you will pay expensive $1000 and afford Eliquis.  ?  ?Plan ? -Pharmacy to process your Eliquis -CMA to inform the pharmacist ?- Pharmacist to investigate if there is any blackbox warnings on E Xarelto ?-Shared decision making against warfarin ?  ? ?Routing to pharmacy team. Please advise. ?

## 2021-11-28 NOTE — Patient Instructions (Addendum)
DOE (dyspnea on exertion) ? ? ?- scoliosis, bronchiectasis, mitral valve leak and and grade 2 heart muscle stiffness contributing to respiratory symptoms ? ? ?Plan ?-Supportive care ?- Address individual components ? ?#Pulmonary embolism diagnosed January 2023 ? ? -Completed free samples of Eliquis.  Long discussion about various alternatives.  We took a shared decision making that you will pay expensive $1000 and afford Eliquis.  ? ?Plan ? -Pharmacy to process your Eliquis -CMA to inform the pharmacist ?- Pharmacist to investigate if there is any blackbox warnings on E Xarelto ?-Shared decision making against warfarin ? ?#Bronchiectasis ? ?-Appears.  Spiriva and flutter valve might not be contributing to any benefit ? ? ?Plan ? -Continue Spiriva and flutter valve for the moment but if you want to stop that is fine and we can monitor ? ?Right lower lobe lung nodule 7.67m on CT 10/09/21 - new ? ?- low risk for lung cancer  ? ?Plan ? - repeat CT chest without contrast end of April 2023 (3 months) ?- ? ?Positive rheumatoid factor January 2023 ? ? Plan ? - At some point we can discuss visiting rheumatologist but hold off for the moment ? ? ?Mitral valve insufficiency, unspecified etiology ?Grade II diastolic dysfunction ? ?- also definitely contributing to shortness of breath ? ?Plan' ?- please d/w Dr KClaiborne Billings? ?Other issues ?- Concerns on carotid, mood -please discuss with primary care physician within ? ?Followup ?-April 2023 after CT scan of the chest [okay to see nurse practitioner] ?

## 2021-11-29 NOTE — Telephone Encounter (Signed)
Pt had an appt with MR 3/2 and this was discussed with pt then. Rx for Eliquis was sent to pharmacy for pt 3/2. Nothing further needed. ?

## 2021-12-02 ENCOUNTER — Emergency Department (HOSPITAL_COMMUNITY): Payer: Medicare Other

## 2021-12-02 ENCOUNTER — Inpatient Hospital Stay (HOSPITAL_COMMUNITY)
Admission: EM | Admit: 2021-12-02 | Discharge: 2021-12-04 | DRG: 291 | Disposition: A | Discharge status: Home / Self Care | Payer: Medicare Other | Attending: Internal Medicine | Admitting: Internal Medicine

## 2021-12-02 ENCOUNTER — Telehealth: Payer: Self-pay | Admitting: Cardiovascular Disease

## 2021-12-02 ENCOUNTER — Encounter (HOSPITAL_COMMUNITY): Payer: Self-pay | Admitting: Cardiology

## 2021-12-02 ENCOUNTER — Other Ambulatory Visit: Payer: Self-pay

## 2021-12-02 DIAGNOSIS — I10 Essential (primary) hypertension: Secondary | ICD-10-CM | POA: Diagnosis not present

## 2021-12-02 DIAGNOSIS — K219 Gastro-esophageal reflux disease without esophagitis: Secondary | ICD-10-CM | POA: Diagnosis not present

## 2021-12-02 DIAGNOSIS — R531 Weakness: Secondary | ICD-10-CM | POA: Diagnosis not present

## 2021-12-02 DIAGNOSIS — I5033 Acute on chronic diastolic (congestive) heart failure: Secondary | ICD-10-CM | POA: Diagnosis not present

## 2021-12-02 DIAGNOSIS — Z8249 Family history of ischemic heart disease and other diseases of the circulatory system: Secondary | ICD-10-CM

## 2021-12-02 DIAGNOSIS — I499 Cardiac arrhythmia, unspecified: Secondary | ICD-10-CM | POA: Diagnosis not present

## 2021-12-02 DIAGNOSIS — I5043 Acute on chronic combined systolic (congestive) and diastolic (congestive) heart failure: Secondary | ICD-10-CM | POA: Diagnosis present

## 2021-12-02 DIAGNOSIS — Z79899 Other long term (current) drug therapy: Secondary | ICD-10-CM | POA: Diagnosis not present

## 2021-12-02 DIAGNOSIS — I5032 Chronic diastolic (congestive) heart failure: Secondary | ICD-10-CM

## 2021-12-02 DIAGNOSIS — Z885 Allergy status to narcotic agent status: Secondary | ICD-10-CM

## 2021-12-02 DIAGNOSIS — R609 Edema, unspecified: Secondary | ICD-10-CM | POA: Diagnosis not present

## 2021-12-02 DIAGNOSIS — E871 Hypo-osmolality and hyponatremia: Secondary | ICD-10-CM | POA: Diagnosis present

## 2021-12-02 DIAGNOSIS — E8770 Fluid overload, unspecified: Secondary | ICD-10-CM | POA: Diagnosis not present

## 2021-12-02 DIAGNOSIS — Z66 Do not resuscitate: Secondary | ICD-10-CM | POA: Diagnosis present

## 2021-12-02 DIAGNOSIS — Z7901 Long term (current) use of anticoagulants: Secondary | ICD-10-CM | POA: Diagnosis not present

## 2021-12-02 DIAGNOSIS — M419 Scoliosis, unspecified: Secondary | ICD-10-CM | POA: Diagnosis not present

## 2021-12-02 DIAGNOSIS — Z91018 Allergy to other foods: Secondary | ICD-10-CM

## 2021-12-02 DIAGNOSIS — G4733 Obstructive sleep apnea (adult) (pediatric): Secondary | ICD-10-CM | POA: Diagnosis not present

## 2021-12-02 DIAGNOSIS — M810 Age-related osteoporosis without current pathological fracture: Secondary | ICD-10-CM | POA: Diagnosis present

## 2021-12-02 DIAGNOSIS — D6869 Other thrombophilia: Secondary | ICD-10-CM | POA: Diagnosis not present

## 2021-12-02 DIAGNOSIS — I493 Ventricular premature depolarization: Secondary | ICD-10-CM | POA: Diagnosis present

## 2021-12-02 DIAGNOSIS — Z888 Allergy status to other drugs, medicaments and biological substances status: Secondary | ICD-10-CM | POA: Diagnosis not present

## 2021-12-02 DIAGNOSIS — I3139 Other pericardial effusion (noninflammatory): Secondary | ICD-10-CM | POA: Diagnosis present

## 2021-12-02 DIAGNOSIS — I4892 Unspecified atrial flutter: Secondary | ICD-10-CM

## 2021-12-02 DIAGNOSIS — I110 Hypertensive heart disease with heart failure: Principal | ICD-10-CM | POA: Diagnosis present

## 2021-12-02 DIAGNOSIS — I4891 Unspecified atrial fibrillation: Secondary | ICD-10-CM | POA: Diagnosis not present

## 2021-12-02 DIAGNOSIS — M353 Polymyalgia rheumatica: Secondary | ICD-10-CM | POA: Diagnosis present

## 2021-12-02 DIAGNOSIS — M4802 Spinal stenosis, cervical region: Secondary | ICD-10-CM | POA: Diagnosis present

## 2021-12-02 DIAGNOSIS — I5030 Unspecified diastolic (congestive) heart failure: Secondary | ICD-10-CM | POA: Diagnosis not present

## 2021-12-02 DIAGNOSIS — I509 Heart failure, unspecified: Secondary | ICD-10-CM | POA: Diagnosis not present

## 2021-12-02 DIAGNOSIS — R002 Palpitations: Secondary | ICD-10-CM | POA: Diagnosis not present

## 2021-12-02 DIAGNOSIS — K900 Celiac disease: Secondary | ICD-10-CM | POA: Diagnosis present

## 2021-12-02 DIAGNOSIS — I2609 Other pulmonary embolism with acute cor pulmonale: Secondary | ICD-10-CM | POA: Diagnosis not present

## 2021-12-02 DIAGNOSIS — Z9049 Acquired absence of other specified parts of digestive tract: Secondary | ICD-10-CM

## 2021-12-02 DIAGNOSIS — J849 Interstitial pulmonary disease, unspecified: Secondary | ICD-10-CM | POA: Diagnosis present

## 2021-12-02 DIAGNOSIS — I2699 Other pulmonary embolism without acute cor pulmonale: Secondary | ICD-10-CM | POA: Diagnosis not present

## 2021-12-02 DIAGNOSIS — Z86711 Personal history of pulmonary embolism: Secondary | ICD-10-CM | POA: Diagnosis not present

## 2021-12-02 DIAGNOSIS — J479 Bronchiectasis, uncomplicated: Secondary | ICD-10-CM | POA: Diagnosis not present

## 2021-12-02 DIAGNOSIS — R000 Tachycardia, unspecified: Secondary | ICD-10-CM | POA: Diagnosis not present

## 2021-12-02 DIAGNOSIS — J90 Pleural effusion, not elsewhere classified: Secondary | ICD-10-CM | POA: Diagnosis not present

## 2021-12-02 DIAGNOSIS — R0609 Other forms of dyspnea: Secondary | ICD-10-CM | POA: Diagnosis not present

## 2021-12-02 DIAGNOSIS — R0600 Dyspnea, unspecified: Secondary | ICD-10-CM | POA: Diagnosis not present

## 2021-12-02 DIAGNOSIS — Z20822 Contact with and (suspected) exposure to covid-19: Secondary | ICD-10-CM | POA: Diagnosis present

## 2021-12-02 DIAGNOSIS — E875 Hyperkalemia: Secondary | ICD-10-CM | POA: Diagnosis not present

## 2021-12-02 LAB — COMPREHENSIVE METABOLIC PANEL
ALT: 17 U/L (ref 0–44)
AST: 45 U/L — ABNORMAL HIGH (ref 15–41)
Albumin: 3.8 g/dL (ref 3.5–5.0)
Alkaline Phosphatase: 52 U/L (ref 38–126)
Anion gap: 12 (ref 5–15)
BUN: 10 mg/dL (ref 8–23)
CO2: 18 mmol/L — ABNORMAL LOW (ref 22–32)
Calcium: 9.3 mg/dL (ref 8.9–10.3)
Chloride: 99 mmol/L (ref 98–111)
Creatinine, Ser: 0.87 mg/dL (ref 0.44–1.00)
GFR, Estimated: >60 mL/min (ref >60)
Glucose, Bld: 96 mg/dL (ref 70–99)
Potassium: 5.2 mmol/L — ABNORMAL HIGH (ref 3.5–5.1)
Sodium: 129 mmol/L — ABNORMAL LOW (ref 135–145)
Total Bilirubin: 1.7 mg/dL — ABNORMAL HIGH (ref 0.3–1.2)
Total Protein: 6.4 g/dL — ABNORMAL LOW (ref 6.5–8.1)

## 2021-12-02 LAB — I-STAT CHEM 8, ED
BUN: 12 mg/dL (ref 8–23)
Calcium, Ion: 1.09 mmol/L — ABNORMAL LOW (ref 1.15–1.40)
Chloride: 99 mmol/L (ref 98–111)
Creatinine, Ser: 0.9 mg/dL (ref 0.44–1.00)
Glucose, Bld: 96 mg/dL (ref 70–99)
HCT: 41 % (ref 36.0–46.0)
Hemoglobin: 13.9 g/dL (ref 12.0–15.0)
Potassium: 5.2 mmol/L — ABNORMAL HIGH (ref 3.5–5.1)
Sodium: 129 mmol/L — ABNORMAL LOW (ref 135–145)
TCO2: 25 mmol/L (ref 22–32)

## 2021-12-02 LAB — TROPONIN I (HIGH SENSITIVITY)
Troponin I (High Sensitivity): 9 ng/L (ref <18)
Troponin I (High Sensitivity): 10 ng/L (ref <18)

## 2021-12-02 LAB — CBC WITH DIFFERENTIAL/PLATELET
Abs Immature Granulocytes: 0.03 10*3/uL (ref 0.00–0.07)
Basophils Absolute: 0.1 10*3/uL (ref 0.0–0.1)
Basophils Relative: 1 %
Eosinophils Absolute: 0.1 10*3/uL (ref 0.0–0.5)
Eosinophils Relative: 2 %
HCT: 39.9 % (ref 36.0–46.0)
Hemoglobin: 13 g/dL (ref 12.0–15.0)
Immature Granulocytes: 0 %
Lymphocytes Relative: 26 %
Lymphs Abs: 1.9 10*3/uL (ref 0.7–4.0)
MCH: 31.6 pg (ref 26.0–34.0)
MCHC: 32.6 g/dL (ref 30.0–36.0)
MCV: 96.8 fL (ref 80.0–100.0)
Monocytes Absolute: 1 10*3/uL (ref 0.1–1.0)
Monocytes Relative: 14 %
Neutro Abs: 4.1 10*3/uL (ref 1.7–7.7)
Neutrophils Relative %: 57 %
Platelets: 232 10*3/uL (ref 150–400)
RBC: 4.12 MIL/uL (ref 3.87–5.11)
RDW: 13.2 % (ref 11.5–15.5)
WBC: 7.2 10*3/uL (ref 4.0–10.5)
nRBC: 0 % (ref 0.0–0.2)

## 2021-12-02 LAB — BRAIN NATRIURETIC PEPTIDE: B Natriuretic Peptide: 640 pg/mL — ABNORMAL HIGH (ref 0.0–100.0)

## 2021-12-02 MED ORDER — TRAZODONE HCL 50 MG PO TABS
25.0000 mg | ORAL_TABLET | Freq: Every evening | ORAL | Status: DC | PRN
  Filled 2021-12-02: qty 1

## 2021-12-02 MED ORDER — METOPROLOL SUCCINATE ER 50 MG PO TB24
50.0000 mg | ORAL_TABLET | Freq: Every day | ORAL | Status: DC
  Administered 2021-12-03 – 2021-12-04 (×2): 50 mg via ORAL
  Filled 2021-12-02 (×2): qty 1

## 2021-12-02 MED ORDER — POLYETHYLENE GLYCOL 3350 17 G PO PACK: 17.0000 g | PACK | Freq: Every day | ORAL | Status: DC | PRN

## 2021-12-02 MED ORDER — DILTIAZEM HCL ER COATED BEADS 120 MG PO CP24
120.0000 mg | ORAL_CAPSULE | Freq: Every day | ORAL | Status: DC
  Administered 2021-12-02 – 2021-12-04 (×3): 120 mg via ORAL
  Filled 2021-12-02 (×4): qty 1

## 2021-12-02 MED ORDER — FUROSEMIDE 10 MG/ML IJ SOLN
40.0000 mg | Freq: Once | INTRAMUSCULAR | Status: AC
  Administered 2021-12-02: 40 mg via INTRAVENOUS
  Filled 2021-12-02: qty 4

## 2021-12-02 MED ORDER — TRAMADOL HCL 50 MG PO TABS: 50.0000 mg | ORAL_TABLET | Freq: Two times a day (BID) | ORAL | Status: DC | PRN

## 2021-12-02 MED ORDER — ALPRAZOLAM 0.5 MG PO TABS
0.5000 mg | ORAL_TABLET | ORAL | Status: DC | PRN
  Administered 2021-12-02: 0.25 mg via ORAL
  Administered 2021-12-03 – 2021-12-04 (×2): 0.5 mg via ORAL
  Filled 2021-12-02 (×2): qty 1
  Filled 2021-12-02: qty 2

## 2021-12-02 MED ORDER — ONDANSETRON HCL 4 MG PO TABS: 4.0000 mg | ORAL_TABLET | Freq: Four times a day (QID) | ORAL | Status: DC | PRN

## 2021-12-02 MED ORDER — ONDANSETRON HCL 4 MG/2ML IJ SOLN
4.0000 mg | Freq: Four times a day (QID) | INTRAMUSCULAR | Status: DC | PRN
Start: 2021-12-02 — End: 2021-12-04

## 2021-12-02 MED ORDER — APIXABAN 5 MG PO TABS
5.0000 mg | ORAL_TABLET | Freq: Two times a day (BID) | ORAL | Status: DC
  Administered 2021-12-02 – 2021-12-04 (×4): 5 mg via ORAL
  Filled 2021-12-02 (×4): qty 1

## 2021-12-02 MED ORDER — ACETAMINOPHEN 650 MG RE SUPP: 650.0000 mg | Freq: Four times a day (QID) | RECTAL | Status: DC | PRN

## 2021-12-02 MED ORDER — ACETAMINOPHEN 325 MG PO TABS
650.0000 mg | ORAL_TABLET | Freq: Four times a day (QID) | ORAL | Status: DC | PRN
  Filled 2021-12-02 (×2): qty 2

## 2021-12-02 MED ORDER — UMECLIDINIUM BROMIDE 62.5 MCG/ACT IN AEPB
1.0000 | INHALATION_SPRAY | Freq: Every day | RESPIRATORY_TRACT | Status: DC
  Filled 2021-12-02: qty 7

## 2021-12-02 MED ORDER — DILTIAZEM HCL 25 MG/5ML IV SOLN
10.0000 mg | Freq: Once | INTRAVENOUS | Status: AC
  Administered 2021-12-02: 10 mg via INTRAVENOUS
  Filled 2021-12-02: qty 5

## 2021-12-02 MED ORDER — TIOTROPIUM BROMIDE MONOHYDRATE 1.25 MCG/ACT IN AERS: 2.0000 | INHALATION_SPRAY | Freq: Every day | RESPIRATORY_TRACT | Status: DC

## 2021-12-02 MED ORDER — FUROSEMIDE 10 MG/ML IJ SOLN
40.0000 mg | Freq: Two times a day (BID) | INTRAMUSCULAR | Status: DC
  Administered 2021-12-03 – 2021-12-04 (×3): 40 mg via INTRAVENOUS
  Filled 2021-12-02 (×3): qty 4

## 2021-12-02 MED ORDER — MAGNESIUM HYDROXIDE 400 MG/5ML PO SUSP
30.0000 mL | Freq: Every day | ORAL | Status: DC | PRN
  Filled 2021-12-02: qty 30

## 2021-12-02 NOTE — Telephone Encounter (Signed)
Dr. Harrington Challenger is calling to report he is having the patient transported by ambulance to the hospital due to new Afib. He wanted to make Dr. [name] aware. Advised him Dr. [name] is doing [facility] Cath today. Dr. Harrington Challenger left cell phone number incase of Dr. [name] wanting to call him in regards to it.  ?

## 2021-12-02 NOTE — Assessment & Plan Note (Addendum)
Patient was admitted to the cardiac ward and was placed on aggressive diuresis with furosemide.  ?Negative fluid balance was achieved, -2.040, with significant improvement in her symptoms.  ? ?Further work up with echocardiogram with LV EF 60 to 65% , moderate dilatation of bilateral atriums. Circumferential pericardial effusion. RV systolic function preserved. RSVP 34,4 mmHg. 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  ? ?Patient will continue heart failure management with metoprolol succinate. ?Diuresis with furosemide. ?Her blood pressure has been low down to 93 mmHg systolic, will hold on spironolactone at this point.  ? ?

## 2021-12-02 NOTE — Assessment & Plan Note (Addendum)
Continue home Cpap ?

## 2021-12-02 NOTE — ED Triage Notes (Signed)
Pt BIB [facility] from [facility] after assessment at their office revealed pt is in afib. Patient visited their office for weakness and swelling x1 week. Patient has hx of aflutter per EMS. HR between 116-156 en route. Pt is A&Ox4 and NAD at this time. ?

## 2021-12-02 NOTE — ED Notes (Signed)
Patient expresses frustration to RN and EMT that she is in the ED and wants to know when she is going to move upstairs. Patient states that she wants to leave and "would rather die at home than deal with all this". RN and NT suggested that a hospital bed could be ordered for her comfort and the patient agreed to stay at this time. ?

## 2021-12-02 NOTE — Consult Note (Signed)
Cardiology Consultation:   Patient ID: Janet Nguyen MRN: [id number]; DOB: [birth year]  Admit date: 12/02/2021 Date of Consult: 12/02/2021  PCP:  Janet Cruel, MD   [facility] Cardiologist:  Janet Majestic, MD        Patient Profile:   Janet Nguyen is a 88 y.o. female with a hx of SVT. PACs, PVCs, atrial flutter but was not on anticoagualtion, OSA, unable to use CPAP, PMR, celiac disease, now with dental appliance, scoliosis GERD,   who is being seen 12/02/2021 for the evaluation of atrial flutter at the request of Janet Nguyen.  History of Present Illness:   Janet Nguyen with above hx and remote cath in 2007.  Echo 10/15/21 with normal LV function no RWMA, G2 DD. Mildly increased PA pressure at 38 mm. Biatrial enlargement severe LAE and moderate RA, moderately severe MR  this has progressed since 2018.     Pt was seen by her PCP Janet Nguyen today for atrial flutter was feeling weak.  Recently she was placed on eliquis for probable PE per pulmonology on VQ scan.   EKG:  The EKG was personally reviewed and demonstrates:  atrial fib with RVR at 126 and no acute ST changes Telemetry:  Telemetry was personally reviewed and demonstrates:  atrial fib   PCXR today IMPRESSION: 1. Low lung volumes. Patchy mild opacities in the right mid lung and at both lung bases, which could represent aspiration, pneumonia or atelectasis. Chest radiograph follow-up advised. 2. Small bilateral pleural effusions.  Na 129, K+ 5.2 BUN 12 Cr 0.90 hs troponin 9 Hgb 13.9, plts 232 WBC 7.2  She has been given 10 mg of Dilt and 40 mg IV lasix. For HF.   BP 117/99 to 108/80  R 15-21 afebrile sp02 on RA at 94%    Past Medical History:  Diagnosis Date   Arrhythmia    History of SVT with documented PVC'S and  PAC'S  12/08/12 Nuc stress test normal LV EF 74%  Event Monitor  12/01/12-01/03/13   Atrial flutter (Janet Nguyen)    Celiac disease    treated by Janet Nguyen   GERD (gastroesophageal reflux disease)     Intervertebral disc stenosis of neural canal of cervical region    Irregular heart beat 11/30/12   ECHO-EF 60-65%   Osteoporosis    PMR (polymyalgia rheumatica) (HCC)    Janet Nguyen; pt states she was diagnosed 10-15 years ago, not treated at this time or any issues that she is aware of.   Scoliosis    Scoliosis    Sleep apnea 10/02/11 [facility]   Sleep study AHI -total sleep 10.3/hr  64.0/ hr during REM sleep.RDI 22.8/hr during total sleep 64.0/hr during REM sleep The lowest O2 sat during Non-REM and REM sleep was 86% and 88% respectively. 04/08/12 CPAP/BIPAP titration study [facility]    Past Surgical History:  Procedure Laterality Date   APPENDECTOMY     ruptured at age 70 and had surgery   CARDIAC CATHETERIZATION  01/27/06   cataract surgery  2015   Dr. Herbert Nguyen; March & April 2015     Home Medications:  Prior to Admission medications   Medication Sig Start Date End Date Taking? Authorizing Provider  ALPRAZolam Duanne Moron) 0.5 MG tablet Take 0.5 mg by mouth as needed.    Janet Cruel, MD  amLODipine (NORVASC) 5 MG tablet TAKE ONE TABLET BY MOUTH DAILY 04/15/21   Janet Sine, MD  apixaban (  ELIQUIS) 5 MG TABS tablet Take 1 tablet (5 mg total) by mouth 2 (two) times daily. 11/28/21   Janet Males, MD  Biotin 1000 MCG tablet     [provider]  Biotin w/ Vitamins C & E (HAIR SKIN & NAILS GUMMIES PO) Take by mouth.    [provider]  Calcium Carb-Ergocalciferol (CHEWABLE CALCIUM/D PO) Take by mouth.    [provider]  Cholecalciferol (D3 2000) 50 MCG (2000 UT) CAPS Take by mouth.    [provider]  EQ ACETAMINOPHEN PO Take 500 mg by mouth as needed (pain).    [provider]  FLUTICASONE PROPIONATE, NASAL, NA Place into the nose daily as needed (rhinitis).    Janet Marble, MD  metoprolol succinate (TOPROL-XL) 50 MG 24 hr tablet TAKE ONE AND ONE HALF TABLETS BY MOUTH TWO TIMES A DAY 02/13/21    Janet Sine, MD  Multiple Vitamins-Minerals (HM MULTIVITAMIN ADULT GUMMY) CHEW Chew by mouth.    [provider]  Omega-3 Fatty Acids ([employer] OMEGA-3 GUMMY FISH PO) Take by mouth.    [provider]  Phenylephrine-APAP-guaiFENesin (EQ SINUS CONGESTION & PAIN PO) Take by mouth as needed (for sinus pain). Contains acetaminophen 325 mg and Phenylephrine 5 mg    [provider]  polyethylene glycol powder (MIRALAX) 17 GM/SCOOP powder Take 1 Container by mouth once.    [provider]  Tiotropium Bromide Monohydrate (SPIRIVA RESPIMAT) 1.25 MCG/ACT AERS Inhale 2 puffs into the lungs daily. 11/28/21   Janet Males, MD  traMADol (ULTRAM) 50 MG tablet Take 50 mg by mouth 2 (two) times daily as needed. 06/25/21   [provider]  ZOLPIDEM TARTRATE ER PO Take 2.5 mg by mouth at bedtime.    Janet Cruel, MD    Inpatient Medications: Scheduled Meds:   Continuous Infusions:  PRN Meds:   Allergies:    Allergies  Allergen Reactions   Gluten Meal     Unknown   Naproxen     Stomach upset   Codeine Nausea Only    Social History:   Social History   Socioeconomic History   Marital status: Divorced    Spouse name: Not on file   Number of children: 4   Years of education: college    Highest education level: Not on file  Occupational History   Occupation: retired   Tobacco Use   Smoking status: Never   Smokeless tobacco: Never  Vaping Use   Vaping Use: Never used  Substance and Sexual Activity   Alcohol use: Yes    Alcohol/week: 1.0 standard drink    Types: 1 Standard drinks or equivalent per week    Comment: drink ETOH socially   Drug use: No   Sexual activity: Not on file  Other Topics Concern   Not on file  Social History Narrative   Lives alone at home   Drinks tea but tries to drink decaf   Has 9 grandchildren    Right handed   Social Determinants of Health   Financial Resource Strain: Not on file  Food Insecurity:  Not on file  Transportation Needs: Not on file  Physical Activity: Not on file  Stress: Not on file  Social Connections: Not on file  Intimate Partner Violence: Not on file    Family History:    Family History  Problem Relation Age of Onset   Breast cancer Mother    Heart disease Father    Migraines Neg Hx  ROS:  Please see the history of present illness.  General:no colds or fevers, no weight changes Skin:no rashes or ulcers HEENT:no blurred vision, no congestion CV:see HPI PUL:see HPI GI:no diarrhea constipation or melena, no indigestion GU:no hematuria, no dysuria MS:no joint pain, no claudication Neuro:no syncope, no lightheadedness Endo:no diabetes, no thyroid disease  All other ROS reviewed and negative.     Physical Exam/Data:   Vitals:   12/02/21 1612 12/02/21 1715 12/02/21 1800 12/02/21 1845  BP: (!) 117/99 115/71 108/80 121/86  Pulse: (!) 126 (!) 56 86 88  Resp: _0 (!) 21  Temp:      TempSrc:      SpO2: 96% 95% 93% 94%  Weight:      Height:       No intake or output data in the 24 hours ending 12/02/21 1918 Last 3 Weights 12/02/2021 11/28/2021 11/22/2021  Weight (lbs) 126 lb 127 lb 6.4 oz 124 lb  Weight (kg) 57.153 kg 57.788 kg 56.246 kg     Body mass index is 23.42 kg/m.  Exam per Dr. Harl Bowie   Relevant CV Studies: echocardiogram 10/15/21 IMPRESSIONS     1. Mitral regurgitation has advanced since prior study.   2. Left ventricular ejection fraction by 3D volume is 63 %. The left  ventricle has normal function. The left ventricle has no regional wall  motion abnormalities. Left ventricular diastolic parameters are consistent  with Grade II diastolic dysfunction  (pseudonormalization). The average left ventricular global longitudinal  strain is -12.0 %. The global longitudinal strain is abnormal.   3. Right ventricular systolic function is normal. The right ventricular  size is normal. There is mildly elevated pulmonary artery systolic   pressure. The estimated right ventricular systolic pressure is 85.0 mmHg.   4. Left atrial size was severely dilated.   5. Right atrial size was moderately dilated.   6. The mitral valve is degenerative. Moderate to severe mitral valve  regurgitation. No evidence of mitral stenosis. There is moderate  holosystolic prolapse of the middle scallop of the posterior leaflet of  the mitral valve.   7. Tricuspid valve regurgitation is moderate.   8. The aortic valve is normal in structure. Aortic valve regurgitation is  not visualized. No aortic stenosis is present.   9. The inferior vena cava is dilated in size with <50% respiratory  variability, suggesting right atrial pressure of 15 mmHg.   Comparison(s): Prior images reviewed side by side. EF 55%, MAC, mild MR in  2018.   FINDINGS   Left Ventricle: Left ventricular ejection fraction by 3D volume is 63 %.  The left ventricle has normal function. The left ventricle has no regional  wall motion abnormalities. The average left ventricular global  longitudinal strain is -12.0 %. The global   longitudinal strain is abnormal. The left ventricular internal cavity  size was normal in size. There is no left ventricular hypertrophy. Left  ventricular diastolic parameters are consistent with Grade II diastolic  dysfunction (pseudonormalization).   Right Ventricle: The right ventricular size is normal. No increase in  right ventricular wall thickness. Right ventricular systolic function is  normal. There is mildly elevated pulmonary artery systolic pressure. The  tricuspid regurgitant velocity is 2.96   m/s, and with an assumed right atrial pressure of 3 mmHg, the estimated  right ventricular systolic pressure is 27.7 mmHg.   Left Atrium: Left atrial size was severely dilated.   Right Atrium: Right atrial size was moderately dilated.  Pericardium: There is no evidence of pericardial effusion.   Mitral Valve: The mitral valve is degenerative  in appearance. There is  moderate holosystolic prolapse of the middle scallop of the posterior  leaflet of the mitral valve. There is moderate thickening of the mitral  valve leaflet(s). There is mild  calcification of the mitral valve leaflet(s). Mild mitral annular  calcification. Moderate to severe mitral valve regurgitation, with  anteriorly-directed jet. No evidence of mitral valve stenosis.   Tricuspid Valve: The tricuspid valve is normal in structure. Tricuspid  valve regurgitation is moderate . No evidence of tricuspid stenosis.   Aortic Valve: The aortic valve is normal in structure. Aortic valve  regurgitation is not visualized. No aortic stenosis is present. Aortic  valve mean gradient measures 3.0 mmHg. Aortic valve peak gradient measures  5.8 mmHg. Aortic valve area, by VTI  measures 2.67 cm.   Pulmonic Valve: The pulmonic valve was normal in structure. Pulmonic valve  regurgitation is not visualized. No evidence of pulmonic stenosis.   Aorta: The aortic root is normal in size and structure.   Venous: The inferior vena cava is dilated in size with less than 50%  respiratory variability, suggesting right atrial pressure of 15 mmHg.   IAS/Shunts: No atrial level shunt detected by color flow Doppler.   Additional Comments: Mitral regurgitation has advanced since prior study.   Recent + VQ scan   Laboratory Data:  High Sensitivity Troponin:   Recent Labs  Lab 12/02/21 1752  TROPONINIHS 9     Chemistry Recent Labs  Lab 12/02/21 1752 12/02/21 1805  NA 129* 129*  K 5.2* 5.2*  CL 99 99  CO2 18*  --   GLUCOSE 96 96  BUN 10 12  CREATININE 0.87 0.90  CALCIUM 9.3  --   GFRNONAA >60  --   ANIONGAP 12  --     Recent Labs  Lab 12/02/21 1752  PROT 6.4*  ALBUMIN 3.8  AST 45*  ALT 17  ALKPHOS 52  BILITOT 1.7*   Lipids No results for input(s): CHOL, TRIG, HDL, LABVLDL, LDLCALC, CHOLHDL in the last 168 hours.  Hematology Recent Labs  Lab 12/02/21 1752  12/02/21 1805  WBC 7.2  --   RBC 4.12  --   HGB 13.0 13.9  HCT 39.9 41.0  MCV 96.8  --   MCH 31.6  --   MCHC 32.6  --   RDW 13.2  --   PLT 232  --    Thyroid No results for input(s): TSH, FREET4 in the last 168 hours.  BNP Recent Labs  Lab 12/02/21 1752  BNP 640.0*    DDimer No results for input(s): DDIMER in the last 168 hours.   Radiology/Studies:  DG Chest Port 1 View  Result Date: 12/02/2021 CLINICAL DATA:  Palpitations, dyspnea EXAM: PORTABLE CHEST 1 VIEW COMPARISON:  10/25/2021 chest radiograph. FINDINGS: Low lung volumes. Stable cardiomediastinal silhouette with top-normal heart size. No pneumothorax. Small bilateral pleural effusions. Patchy mild opacity in the right mid lung and at both lung bases. No overt pulmonary edema. IMPRESSION: 1. Low lung volumes. Patchy mild opacities in the right mid lung and at both lung bases, which could represent aspiration, pneumonia or atelectasis. Chest radiograph follow-up advised. 2. Small bilateral pleural effusions. Electronically Signed   By: Ilona Sorrel M.D.   On: 12/02/2021 16:48     Assessment and Plan:   A fib with RVR on eliquis for recent PE now on dilt drip. Continue but hold  amlodipine. EF was normal in Jan Pulmonary embolism 09/2021 on eliquis  Moderately to severe MR on echo in jan 2023,  OSA with sleep apparatus.  Scoliosis, PMR per IM.   Risk Assessment/Risk Scores:        New York Heart Association (NYHA) Functional Class NYHA Class III  CHA2DS2-VASc Score = 4   This indicates a 4.8% annual risk of stroke. The patient's score is based upon: CHF History: 0 HTN History: 1 Diabetes History: 0 Stroke History: 0 Vascular Disease History: 0 Age Score: 2 Gender Score: 1         For questions or updates, please contact [facility] Please consult [email or web address] for contact info under    Signed, Cecilie Kicks, NP  12/02/2021 7:18 PM

## 2021-12-02 NOTE — Assessment & Plan Note (Addendum)
Continue antiacid therapy with pantoprazole.  ?

## 2021-12-02 NOTE — Telephone Encounter (Signed)
We will need to ask our pharmacist in the office if they have samples to provide her ?

## 2021-12-02 NOTE — Assessment & Plan Note (Addendum)
Patient has been placed on increased dose of metoprolol succinate 75 mg and added diltiazem for better rate control. ?Continue anticoagulation with apixaban.  ?

## 2021-12-02 NOTE — H&P (Signed)
[place]   PATIENT NAME: Janet Nguyen    MR#:  [id number]  DATE OF BIRTH:  09/08/1932  DATE OF ADMISSION:  12/02/2021  PRIMARY CARE PHYSICIAN: Lawerance Cruel, MD   Patient is coming from: Home  REQUESTING/REFERRING PHYSICIAN: Drenda Freeze, MD  CHIEF COMPLAINT:   Chief Complaint  Patient presents with   Irregular Heart Beat    HISTORY OF PRESENT ILLNESS:  AGAM TUOHY is a 88 y.o. female with medical history significant for diastolic CHF, obstructive sleep apnea, atrial flutter and history of PE on Eliquis, GERD, PMR, celiac disease, who presented to the ER with acute onset of palpitations and dyspnea with recent weight gain 5 pounds in 1 week.  She has been having worsening lower extremity edema over the last week.  She went to see her primary care physician today and was noted to have bilateral lower extremity edema.  She has been having worsening dyspnea on exertion just moving from one room to the other.  She denies any chest pain.  No nausea or vomiting or abdominal pain.  She was found to be in atrial fibrillation with rapid ventricular response and therefore she was sent to the emergency room.  No fever or chills.  ED Course: In the ER heart rate was initially 118 and later 126 with otherwise normal vital signs.  Labs revealed hyponatremia with sodium of 129 potassium 5.2 with a CO2 of 18 AST 45 and total protein 6.4 with albumin of 3.8 and total bili 1.7.  BNP of 640.  Sensitive troponin I was 9.  CBC was within normal. EKG as reviewed by me : EKG showed atrial fibrillation with rapid ventricular sponsor 126 with voltage QRS and Q waves anteroseptally Imaging: Chest x-ray showed low lung volumes with patchy mild opacities in the right midlung at both lung bases that could represent aspiration, pneumonia or atelectasis.  It showed small bilateral pleural effusions.  The patient was given 40 mg of IV Lasix and 10 mg of IV Cardizem bolus.  Heart rate was  down to 88.  She will be admitted to a cardiac telemetry bed for further evaluation and management PAST MEDICAL HISTORY:   Past Medical History:  Diagnosis Date   Arrhythmia    History of SVT with documented PVC'S and  PAC'S  12/08/12 Nuc stress test normal LV EF 74%  Event Monitor  12/01/12-01/03/13   Atrial flutter ([place])    Celiac disease    treated by Dr. [name]   GERD (gastroesophageal reflux disease)    Intervertebral disc stenosis of neural canal of cervical region    Irregular heart beat 11/30/12   ECHO-EF 60-65%   Osteoporosis    PMR (polymyalgia rheumatica) (HCC)    Dr. Marijean Bravo; pt states she was diagnosed 10-15 years ago, not treated at this time or any issues that she is aware of.   Scoliosis    Scoliosis    Sleep apnea 10/02/11 [facility]   Sleep study AHI -total sleep 10.3/hr  64.0/ hr during REM sleep.RDI 22.8/hr during total sleep 64.0/hr during REM sleep The lowest O2 sat during Non-REM and REM sleep was 86% and 88% respectively. 04/08/12 CPAP/BIPAP titration study [facility]    PAST SURGICAL HISTORY:   Past Surgical History:  Procedure Laterality Date   APPENDECTOMY     ruptured at age 50 and had surgery   CARDIAC CATHETERIZATION  01/27/06   cataract surgery  2015  Dr. Herbert Deaner; March & April 2015    SOCIAL HISTORY:   Social History   Tobacco Use   Smoking status: Never   Smokeless tobacco: Never  Substance Use Topics   Alcohol use: Yes    Alcohol/week: 1.0 standard drink    Types: 1 Standard drinks or equivalent per week    Comment: drink ETOH socially    FAMILY HISTORY:   Family History  Problem Relation Age of Onset   Breast cancer Mother    Heart disease Father    Migraines Neg Hx     DRUG ALLERGIES:   Allergies  Allergen Reactions   Gluten Meal     Unknown   Naproxen     Stomach upset   Codeine Nausea Only    REVIEW OF SYSTEMS:   ROS As per history of present illness. All pertinent systems  were reviewed above. Constitutional, HEENT, cardiovascular, respiratory, GI, GU, musculoskeletal, neuro, psychiatric, endocrine, integumentary and hematologic systems were reviewed and are otherwise negative/unremarkable except for positive findings mentioned above in the HPI.   MEDICATIONS AT HOME:   Prior to Admission medications   Medication Sig Start Date End Date Taking? Authorizing Provider  ALPRAZolam Duanne Moron) 0.5 MG tablet Take 0.5 mg by mouth as needed.    Lawerance Cruel, MD  amLODipine (NORVASC) 5 MG tablet TAKE ONE TABLET BY MOUTH DAILY 04/15/21   Troy Sine, MD  apixaban (ELIQUIS) 5 MG TABS tablet Take 1 tablet (5 mg total) by mouth 2 (two) times daily. 11/28/21   Brand Males, MD  Biotin 1000 MCG tablet     [provider]  Biotin w/ Vitamins C & E (HAIR SKIN & NAILS GUMMIES PO) Take by mouth.    [provider]  Calcium Carb-Ergocalciferol (CHEWABLE CALCIUM/D PO) Take by mouth.    [provider]  Cholecalciferol (D3 2000) 50 MCG (2000 UT) CAPS Take by mouth.    [provider]  EQ ACETAMINOPHEN PO Take 500 mg by mouth as needed (pain).    [provider]  FLUTICASONE PROPIONATE, NASAL, NA Place into the nose daily as needed (rhinitis).    Jodi Marble, MD  metoprolol succinate (TOPROL-XL) 50 MG 24 hr tablet TAKE ONE AND ONE HALF TABLETS BY MOUTH TWO TIMES A DAY 02/13/21   Troy Sine, MD  Multiple Vitamins-Minerals (HM MULTIVITAMIN ADULT GUMMY) CHEW Chew by mouth.    [provider]  Omega-3 Fatty Acids ([employer] OMEGA-3 GUMMY FISH PO) Take by mouth.    [provider]  Phenylephrine-APAP-guaiFENesin (EQ SINUS CONGESTION & PAIN PO) Take by mouth as needed (for sinus pain). Contains acetaminophen 325 mg and Phenylephrine 5 mg    [provider]  polyethylene glycol powder (MIRALAX) 17 GM/SCOOP powder Take 1 Container by mouth once.    [provider]  Tiotropium Bromide Monohydrate (SPIRIVA  RESPIMAT) 1.25 MCG/ACT AERS Inhale 2 puffs into the lungs daily. 11/28/21   Brand Males, MD  traMADol (ULTRAM) 50 MG tablet Take 50 mg by mouth 2 (two) times daily as needed. 06/25/21   [provider]  ZOLPIDEM TARTRATE ER PO Take 2.5 mg by mouth at bedtime.    Lawerance Cruel, MD      VITAL SIGNS:  Blood pressure 121/86, pulse 88, temperature 98 F (36.7 C), temperature source Oral, resp. rate (!) 21, height 5' 1.5" (1.562 m), weight 57.2 kg, SpO2 94 %.  PHYSICAL EXAMINATION:  Physical Exam  GENERAL:  88 y.o.-year-old Caucasian female patient lying  in the bed with no acute distress.  EYES: Pupils equal, round, reactive to light and accommodation. No scleral icterus. Extraocular muscles intact.  HEENT: Head atraumatic, normocephalic. Oropharynx and nasopharynx clear.  NECK:  Supple, no jugular venous distention. No thyroid enlargement, no tenderness.  LUNGS: Diminished bibasilar breath sounds with bibasilar rales.  No use of accessory muscles of respiration.  CARDIOVASCULAR: Irregularly irregular rhythm, S1, S2 normal. No murmurs, rubs, or gallops.  ABDOMEN: Soft, nondistended, nontender. Bowel sounds present. No organomegaly or mass.  EXTREMITIES: 1+ bilateral lower extremity pitting edema with no cyanosis, or clubbing.  NEUROLOGIC: Cranial nerves II through XII are intact. Muscle strength 5/5 in all extremities. Sensation intact. Gait not checked.  PSYCHIATRIC: The patient is alert and oriented x 3.  Normal affect and good eye contact. SKIN: No obvious rash, lesion, or ulcer.   LABORATORY PANEL:   CBC Recent Labs  Lab 12/02/21 1752 12/02/21 1805  WBC 7.2  --   HGB 13.0 13.9  HCT 39.9 41.0  PLT 232  --    ------------------------------------------------------------------------------------------------------------------  Chemistries  Recent Labs  Lab 12/02/21 1752 12/02/21 1805  NA 129* 129*  K 5.2* 5.2*  CL 99 99  CO2 18*  --   GLUCOSE 96 96  BUN 10 12   CREATININE 0.87 0.90  CALCIUM 9.3  --   AST 45*  --   ALT 17  --   ALKPHOS 52  --   BILITOT 1.7*  --    ------------------------------------------------------------------------------------------------------------------  Cardiac Enzymes No results for input(s): TROPONINI in the last 168 hours. ------------------------------------------------------------------------------------------------------------------  RADIOLOGY:  DG Chest Port 1 View  Result Date: 12/02/2021 CLINICAL DATA:  Palpitations, dyspnea EXAM: PORTABLE CHEST 1 VIEW COMPARISON:  10/25/2021 chest radiograph. FINDINGS: Low lung volumes. Stable cardiomediastinal silhouette with top-normal heart size. No pneumothorax. Small bilateral pleural effusions. Patchy mild opacity in the right mid lung and at both lung bases. No overt pulmonary edema. IMPRESSION: 1. Low lung volumes. Patchy mild opacities in the right mid lung and at both lung bases, which could represent aspiration, pneumonia or atelectasis. Chest radiograph follow-up advised. 2. Small bilateral pleural effusions. Electronically Signed   By: Ilona Sorrel M.D.   On: 12/02/2021 16:48      IMPRESSION AND PLAN:  Assessment and Plan: * Acute on chronic diastolic CHF (congestive heart failure) ([place]) - The patient be admitted to a cardiac telemetry bed. - We will continue diuresis with IV Lasix. - We will follow serial troponins. - Had a 2D echo/17/2020 revealed an EF of 53% with grade 2 diastolic dysfunction severe patient the left atrium and mild dilation of the right atrium, moderate to severe mitral valve regurgitation and moderate tricuspid regurgitation. - Cardiology consult was obtained by [facility] in the ER.  Atrial fibrillation with rapid ventricular response (HCC) - The patient responded to IV Cardizem bolus. - She willl be placed on p.o. Cardizem CD. - We will hold off Norvasc. - Her CHA2DS2-VASc score is 4. - We will continue Eliquis.  Essential hypertension -  We will continue Toprol-XL and she will be on Cardizem CD.  Obstructive sleep apnea - We will continue CPAP nightly.  GERD without esophagitis - We will continue PPI therapy.  History of pulmonary embolism - We will continue Eliquis.  Interstitial lung disease ([place]) -She has a history of bronchiectasis. - We will continue Spiriva.       DVT prophylaxis: Eliquis. Advanced Care Planning:  Code Status: She desires to be DNR/DNI.   Family  Communication:  The plan of care was discussed in details with the patient (and family). I answered all questions. The patient agreed to proceed with the above mentioned plan. Further management will depend upon hospital course. Disposition Plan: Back to previous home environment Consults called: Cardiology. All the records are reviewed and case discussed with ED provider.  Status is: Inpatient   At the time of the admission, it appears that the appropriate admission status for this patient is inpatient.  This is judged to be reasonable and necessary in order to provide the required intensity of service to ensure the patient's safety given the presenting symptoms, physical exam findings and initial radiographic and laboratory data in the context of comorbid conditions.  The patient requires inpatient status due to high intensity of service, high risk of further deterioration and high frequency of surveillance required.  I certify that at the time of admission, it is my clinical judgment that the patient will require inpatient hospital care extending more than 2 midnights.                            Dispo: The patient is from: Home              Anticipated d/c is to: Home              Patient currently is not medically stable to d/c.              Difficult to place patient: No  Christel Mormon M.D on 12/02/2021 at 8:04 PM  [facility]   From 7 PM-7 AM, contact night-coverage [email or web address]  CC: Primary care physician; Lawerance Cruel,  MD

## 2021-12-02 NOTE — ED Provider Notes (Signed)
?[place] ?Provider Note ? ? ?CSN: [id number] ?Arrival date & time: 12/02/21  1548 ? ?  ? ?History ? ?Chief Complaint  ?Patient presents with  ? Irregular Heart Beat  ? ? ?Janet Nguyen is a 88 y.o. female history of PE on Eliquis, A-fib, diastolic heart failure, who presenting with shortness of breath, tachycardia, weight gain.  Patient states that over the last week or so, she noticed that her legs are more swollen.  Patient also noticed that she has some palpitations.  She also gained about 5 pounds since last week.  Patient was at PCP office today and was noted to be volume overloaded and also was in rapid A-fib and sent to the ER.  Patient states that she is compliant with her Eliquis ? ?The history is provided by the patient.  ? ?  ? ?Home Medications ?Prior to Admission medications   ?Medication Sig Start Date End Date Taking? Authorizing Provider  ?ALPRAZolam (XANAX) 0.5 MG tablet Take 0.5 mg by mouth as needed.    Lawerance Cruel, MD  ?amLODipine (NORVASC) 5 MG tablet TAKE ONE TABLET BY MOUTH DAILY 04/15/21   Troy Sine, MD  ?apixaban (ELIQUIS) 5 MG TABS tablet Take 1 tablet (5 mg total) by mouth 2 (two) times daily. 11/28/21   Brand Males, MD  ?Biotin 1000 MCG tablet     [provider]  ?Biotin w/ Vitamins C & E (HAIR SKIN & NAILS GUMMIES PO) Take by mouth.    [provider]  ?Calcium Carb-Ergocalciferol (CHEWABLE CALCIUM/D PO) Take by mouth.    [provider]  ?Cholecalciferol (D3 2000) 50 MCG (2000 UT) CAPS Take by mouth.    [provider]  ?EQ ACETAMINOPHEN PO Take 500 mg by mouth as needed (pain).    [provider]  ?FLUTICASONE PROPIONATE, NASAL, NA Place into the nose daily as needed (rhinitis).    Jodi Marble, MD  ?metoprolol succinate (TOPROL-XL) 50 MG 24 hr tablet TAKE ONE AND ONE HALF TABLETS BY MOUTH TWO TIMES A DAY 02/13/21   Troy Sine, MD  ?Multiple Vitamins-Minerals (HM MULTIVITAMIN  ADULT GUMMY) CHEW Chew by mouth.    [provider]  ?Omega-3 Fatty Acids ([employer] OMEGA-3 GUMMY FISH PO) Take by mouth.    [provider]  ?Phenylephrine-APAP-guaiFENesin (EQ SINUS CONGESTION & PAIN PO) Take by mouth as needed (for sinus pain). Contains acetaminophen 325 mg and Phenylephrine 5 mg    [provider]  ?polyethylene glycol powder (MIRALAX) 17 GM/SCOOP powder Take 1 Container by mouth once.    [provider]  ?Tiotropium Bromide Monohydrate (SPIRIVA RESPIMAT) 1.25 MCG/ACT AERS Inhale 2 puffs into the lungs daily. 11/28/21   Brand Males, MD  ?traMADol (ULTRAM) 50 MG tablet Take 50 mg by mouth 2 (two) times daily as needed. 06/25/21   [provider]  ?ZOLPIDEM TARTRATE ER PO Take 2.5 mg by mouth at bedtime.    Lawerance Cruel, MD  ?   ? ?Allergies    ?Gluten meal, Naproxen, and Codeine   ? ?Review of Systems   ?Review of Systems  ?Respiratory:  Positive for shortness of breath.   ?Cardiovascular:  Positive for palpitations and leg swelling.  ?All other systems reviewed and are negative. ? ?Physical Exam ?Updated Vital Signs ?BP (!) 117/99 (BP Location: Left Arm)   Pulse (!) 126   Temp 98 ?F (36.7 ?C) (Oral)   Resp 20   Ht 5' 1.5" (1.562  m)   Wt 57.2 kg   SpO2 96%   BMI 23.42 kg/m?  ?Physical Exam ?Vitals and nursing note reviewed.  ?Constitutional:   ?   Comments: Chronically ill, slightly uncomfortable  ?HENT:  ?   Head: Normocephalic.  ?   Mouth/Throat:  ?   Mouth: Mucous membranes are moist.  ?Eyes:  ?   Extraocular Movements: Extraocular movements intact.  ?   Pupils: Pupils are equal, round, and reactive to light.  ?Neck:  ?   Comments: Patient has JVD ?Cardiovascular:  ?   Rate and Rhythm: Tachycardia present. Rhythm irregular.  ?   Pulses: Normal pulses.  ?Pulmonary:  ?   Comments: Crackles bilateral bases ?Abdominal:  ?   General: Abdomen is flat.  ?   Palpations: Abdomen is soft.  ?Musculoskeletal:  ?   Cervical back: Normal range of  motion and neck supple.  ?   Comments: 2+ edema bilateral leg  ?Skin: ?   General: Skin is warm.  ?   Capillary Refill: Capillary refill takes less than 2 seconds.  ?Neurological:  ?   General: No focal deficit present.  ?   Mental Status: She is oriented to person, place, and time.  ?Psychiatric:     ?   Mood and Affect: Mood normal.     ?   Behavior: Behavior normal.  ? ? ?ED Results / Procedures / Treatments   ?Labs ?(all labs ordered are listed, but only abnormal results are displayed) ?Labs Reviewed  ?CBC WITH DIFFERENTIAL/PLATELET  ?COMPREHENSIVE METABOLIC PANEL  ?BRAIN NATRIURETIC PEPTIDE  ?I-STAT CHEM 8, ED  ?TROPONIN I (HIGH SENSITIVITY)  ? ? ?EKG ?EKG Interpretation ? ?Date/Time:  Monday December 02 2021 16:12:01 EST ?Ventricular Rate:  126 ?PR Interval:    ?QRS Duration: 93 ?QT Interval:  334 ?QTC Calculation: 460 ?R Axis:   8 ?Text Interpretation: Atrial fibrillation Low voltage, extremity and precordial leads Probable anteroseptal infarct, old Since last tracing rate faster Confirmed by Wandra Arthurs [phone]) on 12/02/2021 4:24:09 PM ? ?Radiology ?DG Chest Port 1 View ? ?Result Date: 12/02/2021 ?CLINICAL DATA:  Palpitations, dyspnea EXAM: PORTABLE CHEST 1 VIEW COMPARISON:  10/25/2021 chest radiograph. FINDINGS: Low lung volumes. Stable cardiomediastinal silhouette with top-normal heart size. No pneumothorax. Small bilateral pleural effusions. Patchy mild opacity in the right mid lung and at both lung bases. No overt pulmonary edema. IMPRESSION: 1. Low lung volumes. Patchy mild opacities in the right mid lung and at both lung bases, which could represent aspiration, pneumonia or atelectasis. Chest radiograph follow-up advised. 2. Small bilateral pleural effusions. Electronically Signed   By: Ilona Sorrel M.D.   On: 12/02/2021 16:48   ? ?Procedures ?Procedures  ? ? ?Medications Ordered in ED ?Medications  ?diltiazem (CARDIZEM) injection 10 mg (has no administration in time range)  ? ? ?ED Course/ Medical Decision  Making/ A&P ?  ?                        ?Medical Decision Making ?Janet Nguyen is a 88 y.o. female here presenting with rapid A-fib and also signs of heart failure.  Patient has a history of diastolic heart failure and A-fib and anticoagulated.  I am concerned for worsening heart failure and rapid A-fib may be secondary to the heart failure.  Plan to get CBC and CMP and BNP and chest x-ray.  We will consult cardiology as well ?  ?7:35 PM ?Patient's troponin is normal.  Patient's BNP  is elevated at 640.  Cardiology saw patient and recommend IV Lasix and admission to the hospital service.  Cardiology will follow along ? ?Problems Addressed: ?Atrial fibrillation, unspecified type [facility]): acute illness or injury ?Congestive heart failure, unspecified HF chronicity, unspecified heart failure type [facility]): acute illness or injury ? ?Amount and/or Complexity of Data Reviewed ?Labs: ordered. ?Radiology: ordered and independent interpretation performed. ?ECG/medicine tests: ordered and independent interpretation performed. Decision-making details documented in ED Course. ? ?Risk ?Prescription drug management. ?Decision regarding hospitalization. ? ?Final Clinical Impression(s) / ED Diagnoses ?Final diagnoses:  ?None  ? ? ?Rx / DC Orders ?ED Discharge Orders   ? ? None  ? ?  ? ? ?  ?Drenda Freeze, MD ?12/02/21 1936 ? ?

## 2021-12-02 NOTE — Assessment & Plan Note (Addendum)
Continue anticoagulation with apixaban.  ?

## 2021-12-02 NOTE — ED Notes (Signed)
Patient ambulated to restroom with minimal assistance. ?

## 2021-12-02 NOTE — Assessment & Plan Note (Addendum)
Continue blood pressure control with diltiazem and metoprolol. ?Discontinue amlodipine  ?

## 2021-12-02 NOTE — Assessment & Plan Note (Deleted)
-  She has a history of bronchiectasis. ?- We will continue Spiriva. ?

## 2021-12-03 ENCOUNTER — Other Ambulatory Visit (HOSPITAL_COMMUNITY): Payer: Self-pay

## 2021-12-03 ENCOUNTER — Inpatient Hospital Stay (HOSPITAL_COMMUNITY): Payer: Medicare Other

## 2021-12-03 ENCOUNTER — Encounter (HOSPITAL_COMMUNITY): Payer: Self-pay | Admitting: Family Medicine

## 2021-12-03 DIAGNOSIS — I2609 Other pulmonary embolism with acute cor pulmonale: Secondary | ICD-10-CM

## 2021-12-03 DIAGNOSIS — I5033 Acute on chronic diastolic (congestive) heart failure: Secondary | ICD-10-CM

## 2021-12-03 LAB — CBC
HCT: 38.1 % (ref 36.0–46.0)
Hemoglobin: 12.7 g/dL (ref 12.0–15.0)
MCH: 30.8 pg (ref 26.0–34.0)
MCHC: 33.3 g/dL (ref 30.0–36.0)
MCV: 92.5 fL (ref 80.0–100.0)
Platelets: 250 10*3/uL (ref 150–400)
RBC: 4.12 MIL/uL (ref 3.87–5.11)
RDW: 13.1 % (ref 11.5–15.5)
WBC: 6.2 10*3/uL (ref 4.0–10.5)
nRBC: 0 % (ref 0.0–0.2)

## 2021-12-03 LAB — RESP PANEL BY RT-PCR (FLU A&B, COVID) ARPGX2
Influenza A by PCR: NEGATIVE
Influenza B by PCR: NEGATIVE
SARS Coronavirus 2 by RT PCR: NEGATIVE

## 2021-12-03 LAB — ECHOCARDIOGRAM LIMITED
Calc EF: 60.8 %
Height: 61.5 in
MV M vel: 4.68 m/s
MV Peak grad: 87.6 mmHg
Radius: 0.5 cm
S' Lateral: 2.7 cm
Single Plane A2C EF: 61.8 %
Single Plane A4C EF: 60 %
Weight: 1985.9 oz

## 2021-12-03 LAB — BASIC METABOLIC PANEL
Anion gap: 12 (ref 5–15)
BUN: 10 mg/dL (ref 8–23)
CO2: 26 mmol/L (ref 22–32)
Calcium: 9.5 mg/dL (ref 8.9–10.3)
Chloride: 97 mmol/L — ABNORMAL LOW (ref 98–111)
Creatinine, Ser: 0.97 mg/dL (ref 0.44–1.00)
GFR, Estimated: 57 mL/min — ABNORMAL LOW (ref >60)
Glucose, Bld: 95 mg/dL (ref 70–99)
Potassium: 3.6 mmol/L (ref 3.5–5.1)
Sodium: 135 mmol/L (ref 135–145)

## 2021-12-03 LAB — TROPONIN I (HIGH SENSITIVITY): Troponin I (High Sensitivity): 10 ng/L (ref <18)

## 2021-12-03 MED ORDER — POTASSIUM CHLORIDE CRYS ER 10 MEQ PO TBCR
20.0000 meq | EXTENDED_RELEASE_TABLET | Freq: Once | ORAL | Status: AC
  Administered 2021-12-03: 20 meq via ORAL
  Filled 2021-12-03: qty 2

## 2021-12-03 MED ORDER — POTASSIUM CHLORIDE CRYS ER 20 MEQ PO TBCR
30.0000 meq | EXTENDED_RELEASE_TABLET | Freq: Two times a day (BID) | ORAL | Status: AC
  Administered 2021-12-03: 30 meq via ORAL
  Filled 2021-12-03 (×2): qty 1

## 2021-12-03 NOTE — Progress Notes (Signed)
?  Echocardiogram ?2D Echocardiogram has been performed. ? ?Janet Nguyen ?12/03/2021, 2:59 PM ?

## 2021-12-03 NOTE — Progress Notes (Signed)
Progress Note  Patient Name: Janet Nguyen Date of Encounter: 12/03/2021  [facility] Cardiologist: Shelva Majestic, MD   Subjective   Feeling better this morning. No chest pain. LE edema improving.  Cr stable at 0.97 HR 80-120s Wt 126>124lbs I/O not recorded  Inpatient Medications    Scheduled Meds:  apixaban  5 mg Oral BID   diltiazem  120 mg Oral Daily   furosemide  40 mg Intravenous Q12H   metoprolol succinate  50 mg Oral Daily   umeclidinium bromide  1 puff Inhalation Daily   Continuous Infusions:  PRN Meds: acetaminophen **OR** acetaminophen, ALPRAZolam, magnesium hydroxide, ondansetron **OR** ondansetron (ZOFRAN) IV, polyethylene glycol, traMADol, traZODone   Vital Signs    Vitals:   12/03/21 0245 12/03/21 0400 12/03/21 0420 12/03/21 0445  BP: 109/71 111/72  128/88  Pulse: 65 83  (!) 103  Resp: _0 Temp:   97.7 F (36.5 C) (!) 97.5 F (36.4 C)  TempSrc:   Oral Oral  SpO2: 95% 95%  95%  Weight:    56.3 kg  Height:    5' 1.5" (1.562 m)   No intake or output data in the 24 hours ending 12/03/21 1118 Last 3 Weights 12/03/2021 12/02/2021 11/28/2021  Weight (lbs) 124 lb 1.9 oz 126 lb 127 lb 6.4 oz  Weight (kg) 56.3 kg 57.153 kg 57.788 kg      Telemetry    Afib/flutter with HR 80-120s - Personally Reviewed  ECG    Aflutter with variable block, anteroseptal q waves - Personally Reviewed  Physical Exam   GEN: Elderly female, comfortbale   Neck: JVD to mid-neck Cardiac: Irregular, 2/6 systolic murmur  Respiratory: Diminished at bases GI: Soft, nontender, non-distended  MS: 2+ pedal edema, warm Neuro:  Nonfocal  Psych: Normal affect   Labs    High Sensitivity Troponin:   Recent Labs  Lab 12/02/21 1752 12/02/21 1915 12/03/21 0532  TROPONINIHS _1 Chemistry Recent Labs  Lab 12/02/21 1752 12/02/21 1805 12/03/21 0532  NA 129* 129* 135  K 5.2* 5.2* 3.6  CL 99 99 97*  CO2 18*  --  26  GLUCOSE 96 96 95  BUN _2 CREATININE 0.87 0.90 0.97  CALCIUM 9.3  --  9.5  PROT 6.4*  --   --   ALBUMIN 3.8  --   --   AST 45*  --   --   ALT 17  --   --   ALKPHOS 52  --   --   BILITOT 1.7*  --   --   GFRNONAA >60  --  57*  ANIONGAP 12  --  12    Lipids No results for input(s): CHOL, TRIG, HDL, LABVLDL, LDLCALC, CHOLHDL in the last 168 hours.  Hematology Recent Labs  Lab 12/02/21 1752 12/02/21 1805 12/03/21 0532  WBC 7.2  --  6.2  RBC 4.12  --  4.12  HGB 13.0 13.9 12.7  HCT 39.9 41.0 38.1  MCV 96.8  --  92.5  MCH 31.6  --  30.8  MCHC 32.6  --  33.3  RDW 13.2  --  13.1  PLT 232  --  250   Thyroid No results for input(s): TSH, FREET4 in the last 168 hours.  BNP Recent Labs  Lab 12/02/21 1752  BNP 640.0*    DDimer No results for input(s): DDIMER in the last 168 hours.   Radiology  DG Chest Port 1 View  Result Date: 12/02/2021 CLINICAL DATA:  Palpitations, dyspnea EXAM: PORTABLE CHEST 1 VIEW COMPARISON:  10/25/2021 chest radiograph. FINDINGS: Low lung volumes. Stable cardiomediastinal silhouette with top-normal heart size. No pneumothorax. Small bilateral pleural effusions. Patchy mild opacity in the right mid lung and at both lung bases. No overt pulmonary edema. IMPRESSION: 1. Low lung volumes. Patchy mild opacities in the right mid lung and at both lung bases, which could represent aspiration, pneumonia or atelectasis. Chest radiograph follow-up advised. 2. Small bilateral pleural effusions. Electronically Signed   By: Ilona Sorrel M.D.   On: 12/02/2021 16:48    Cardiac Studies   echocardiogram 10/15/21 IMPRESSIONS     1. Mitral regurgitation has advanced since prior study.   2. Left ventricular ejection fraction by 3D volume is 63 %. The left  ventricle has normal function. The left ventricle has no regional wall  motion abnormalities. Left ventricular diastolic parameters are consistent  with Grade II diastolic dysfunction  (pseudonormalization). The average left ventricular global  longitudinal  strain is -12.0 %. The global longitudinal strain is abnormal.   3. Right ventricular systolic function is normal. The right ventricular  size is normal. There is mildly elevated pulmonary artery systolic  pressure. The estimated right ventricular systolic pressure is 88.2 mmHg.   4. Left atrial size was severely dilated.   5. Right atrial size was moderately dilated.   6. The mitral valve is degenerative. Moderate to severe mitral valve  regurgitation. No evidence of mitral stenosis. There is moderate  holosystolic prolapse of the middle scallop of the posterior leaflet of  the mitral valve.   7. Tricuspid valve regurgitation is moderate.   8. The aortic valve is normal in structure. Aortic valve regurgitation is  not visualized. No aortic stenosis is present.   9. The inferior vena cava is dilated in size with <50% respiratory  variability, suggesting right atrial pressure of 15 mmHg.   Comparison(s): Prior images reviewed side by side. EF 55%, MAC, mild MR in  2018.   FINDINGS   Left Ventricle: Left ventricular ejection fraction by 3D volume is 63 %.  The left ventricle has normal function. The left ventricle has no regional  wall motion abnormalities. The average left ventricular global  longitudinal strain is -12.0 %. The global   longitudinal strain is abnormal. The left ventricular internal cavity  size was normal in size. There is no left ventricular hypertrophy. Left  ventricular diastolic parameters are consistent with Grade II diastolic  dysfunction (pseudonormalization).   Right Ventricle: The right ventricular size is normal. No increase in  right ventricular wall thickness. Right ventricular systolic function is  normal. There is mildly elevated pulmonary artery systolic pressure. The  tricuspid regurgitant velocity is 2.96   m/s, and with an assumed right atrial pressure of 3 mmHg, the estimated  right ventricular systolic pressure is 80.0 mmHg.    Left Atrium: Left atrial size was severely dilated.   Right Atrium: Right atrial size was moderately dilated.   Pericardium: There is no evidence of pericardial effusion.   Mitral Valve: The mitral valve is degenerative in appearance. There is  moderate holosystolic prolapse of the middle scallop of the posterior  leaflet of the mitral valve. There is moderate thickening of the mitral  valve leaflet(s). There is mild  calcification of the mitral valve leaflet(s). Mild mitral annular  calcification. Moderate to severe mitral valve regurgitation, with  anteriorly-directed jet. No evidence of mitral valve stenosis.  Tricuspid Valve: The tricuspid valve is normal in structure. Tricuspid  valve regurgitation is moderate . No evidence of tricuspid stenosis.   Aortic Valve: The aortic valve is normal in structure. Aortic valve  regurgitation is not visualized. No aortic stenosis is present. Aortic  valve mean gradient measures 3.0 mmHg. Aortic valve peak gradient measures  5.8 mmHg. Aortic valve area, by VTI  measures 2.67 cm.   Pulmonic Valve: The pulmonic valve was normal in structure. Pulmonic valve  regurgitation is not visualized. No evidence of pulmonic stenosis.   Aorta: The aortic root is normal in size and structure.   Venous: The inferior vena cava is dilated in size with less than 50%  respiratory variability, suggesting right atrial pressure of 15 mmHg.   IAS/Shunts: No atrial level shunt detected by color flow Doppler.   Additional Comments: Mitral regurgitation has advanced since prior study.    Recent + VQ scan   Patient Profile     88 y.o. female SVT, atrial flutter previously not on AC, OSA, PMR, celiac disease, scoliosis and GERD who presented to her PCP office on 3/6 with weakness and LE edema found to be in aflutter with RVR with evidence of volume overload prompting admission to [facility]. Cardiology is consulted for management of Afib and diastolic heart  failure.   Assessment & Plan    #Afib with RVR: CHADs-vasc 4. Patient with known history of Aflutter not previously on [facility]. Presented to PCP office with worsening weakness and LE edema found to be in Aflutter with RVR. Now with improved rates since starting dilt and metop. -Continue dilt 171m daily -Continue metop 571mXL daily -Continue apxiaban 2m12mID for AC  #Acute on Chronic Diastolic HF Exacerbation: Presented to PCP office with weight gain and LE edema found to be in Afib with RVR as above with evidence of volume overload [facility]. BNP 640 with small pleural effusions on CXR. LVEF 63% on TTE 09/2021. Given lasix yesterday with improvement. -Cntinue lasix 7m76m BID -Start spironolactone 12.2mg 36mly -Repeat limited TTE given acute decompensation and recent PE -Consider SGLT2i as out-patient -Monitor I/Os and daily weights -Low Na deit  #PE: -Continue apixaban 2mg B32m-Check limited TTE   #Moderate to Severe MR: -Continue diuresis as above -Will need continued monitoring  #HTN: -Continue dilt 120mg d69m -Continue metop 50mg XL63mly      For questions or updates, please contact [facility] Please consult [email or web address] for contact info under        Signed, Wilsie Kern Freada Bergeron7/2023, 11:18 AM

## 2021-12-03 NOTE — Progress Notes (Signed)
Heart Failure Nurse Navigator Progress Note ? ?Assessed for HV TOC readiness. Pt declined HV TOC clinic appt upon DC from hospitalization. Pt educated on benefits of HV TOC. Pt education complete regarding HF patient booklet.  ? ?Patient has follow up with Dr. [name] 12/18/2021.  ? ?Pricilla Holm, MSN, RN ?Heart Failure Nurse Navigator ?[phone] ? ?

## 2021-12-03 NOTE — TOC Initial Note (Signed)
Transition of Care (TOC) - Initial/Assessment Note  ? ? ?Patient Details  ?Name: Janet Nguyen ?MRN: [id number] ?Date of Birth: 02/10/1932 ? ?Transition of Care (TOC) CM/SW Contact:    ?Graves-Bigelow, Ocie Cornfield, RN ?Phone Number: ?12/03/2021, 1:08 PM ? ?Clinical Narrative:  Case Manager received a heart failure consult on the patient. PTA patient was independent from home. Patient states she still drives and works in her garden. Patient does not use any durable medical equipment. Patient gets medications from [facility] and has not had any issues obtaining medications. Patient states she gets grocery from [employer]. Per daughter at the bedside, the patients other daughter will be staying with the patient post hospitalization. Case Manager made the patient aware to call her PCP if she needs future home health services. Patient was appreciative of the time spent. Case Manager will continue to follow for additional transition of care needs.               ? ? ?Expected Discharge Plan: Home/Self Care ?Barriers to Discharge: Continued Medical Work up ? ? ?Patient Goals and CMS Choice ?Patient states their goals for this hospitalization and ongoing recovery are:: to be as independent as she can be. ?  ?Choice offered to / list presented to : NA ? ?Expected Discharge Plan and Services ?Expected Discharge Plan: Home/Self Care ?In-house Referral: NA ?Discharge Planning Services: CM Consult ?Post Acute Care Choice: NA ?Living arrangements for the past 2 months: [place] ?                ?  ?DME Agency: NA ?  ?  ?  ?HH Arranged: NA ?  ?  ?  ?  ? ?Prior Living Arrangements/Services ?Living arrangements for the past 2 months: [place] ?Lives with:: Self (daughter will stay with her after hospitalization) ?Patient language and need for interpreter reviewed:: Yes ?Do you feel safe going back to the place where you live?: Yes      ?Need for Family Participation in Patient Care: Yes (Comment) ?Care  giver support system in place?: Yes (comment) ?  ?Criminal Activity/Legal Involvement Pertinent to Current Situation/Hospitalization: No - Comment as needed ? ?Activities of Daily Living ?Home Assistive Devices/Equipment: None ?ADL Screening (condition at time of admission) ?Patient's cognitive ability adequate to safely complete daily activities?: Yes ?Is the patient deaf or have difficulty hearing?: No ?Does the patient have difficulty seeing, even when wearing glasses/contacts?: No ?Does the patient have difficulty concentrating, remembering, or making decisions?: No ?Patient able to express need for assistance with ADLs?: Yes ?Does the patient have difficulty dressing or bathing?: No ?Independently performs ADLs?: Yes (appropriate for developmental age) ?Does the patient have difficulty walking or climbing stairs?: No ?Weakness of Legs: None ?Weakness of Arms/Hands: None ? ?Permission Sought/Granted ?Permission sought to share information with : Family Supports, Case Manager ?  ?   ?   ?   ?   ? ?Emotional Assessment ?Appearance:: Appears stated age ?Attitude/Demeanor/Rapport: Engaged ?Affect (typically observed): Appropriate ?Orientation: : Oriented to Situation, Oriented to  Time, Oriented to Place, Oriented to Self ?Alcohol / Substance Use: Not Applicable ?Psych Involvement: No (comment) ? ?Admission diagnosis:  Acute CHF (congestive heart failure) ([place]) [I50.9] ?Atrial fibrillation, unspecified type ([place]) [I48.91] ?Congestive heart failure, unspecified HF chronicity, unspecified heart failure type ([place]) [I50.9] ?Patient Active Problem List  ? Diagnosis Date Noted  ? Acute CHF (congestive heart failure) ([place]) 12/02/2021  ? Acute on chronic diastolic CHF (congestive heart failure) ([place])  12/02/2021  ? Atrial fibrillation with rapid ventricular response ([place]) 12/02/2021  ? Essential hypertension 12/02/2021  ? GERD without esophagitis 12/02/2021  ? Obstructive sleep apnea 12/02/2021  ? Interstitial lung disease  ([place]) 12/02/2021  ? History of pulmonary embolism 12/02/2021  ? Pulmonary embolism ([place]) 11/06/2021  ? Bronchiectasis ([place]) 11/06/2021  ? Deviated septum 05/08/2020  ? Mass of subcutaneous tissue 05/02/2020  ? Vertigo 06/16/2019  ? Pulmonary hypertension, unspecified ([place]) 04/14/2019  ? Ischemic colitis ([place]) 04/14/2019  ? Migraine with aura and without status migrainosus, not intractable 11/08/2018  ? Degeneration of lumbar intervertebral disc 10/14/2018  ? Hoarseness of voice 03/04/2018  ? Abdominal pain 07/01/2017  ? Diverticulitis, colon   ? Metabolic acidosis, increased anion gap   ? Constipation 04/14/2017  ? Lumbar hernia 04/14/2017  ? Presbycusis of both ears 01/10/2017  ? Tinnitus aurium, bilateral 01/10/2017  ? Gastroesophageal reflux disease 08/20/2016  ? Hemoptysis 08/20/2016  ? Obstructive sleep apnea of adult 08/20/2016  ? Rhinitis, chronic 08/20/2016  ? Throat pain in adult 08/20/2016  ? Fatigue 12/23/2015  ? Sciatica of right side 10/06/2015  ? History of migraine headaches 10/06/2015  ? Frequent PVCs 12/28/2013  ? Premature atrial contractions 12/28/2013  ? PSVT (paroxysmal supraventricular tachycardia) ([place]) 12/28/2013  ? Heart palpitations 07/13/2013  ? Sleep apnea 04/11/2013  ? Scoliosis 04/11/2013  ? Atrial flutter with rapid ventricular response ([place]) 11/29/2012  ? Chest pain, atypical 11/29/2012  ? Fibromyalgia syndrome 11/29/2012  ? Chronic steroid use 11/29/2012  ? ?PCP:  Lawerance Cruel, MD ?Pharmacy:   ?[facility] [id number] - [place], [address] ?[address] ?[place] [address] ?Phone: [phone] Fax: [phone] ? ? ? ? ?Social Determinants of Health (SDOH) Interventions ?Food Insecurity Interventions: Intervention Not Indicated ?Financial Strain Interventions: Intervention Not Indicated ?Housing Interventions: Intervention Not Indicated ?Transportation Interventions: Intervention Not Indicated ? ?Readmission Risk Interventions ?No flowsheet data  found. ? ? ?

## 2021-12-03 NOTE — Progress Notes (Signed)
PROGRESS NOTE    Janet Nguyen  WUJ:[id number] DOB: 01-03-1932 DOA: 12/02/2021 PCP: Lawerance Cruel, MD  Narrative 89/F with history of atrial flutter, SVT, PVCs, OSA, Celiac disease, was started on Eliquis last month for PE-by pulmonary MD presented to the ED with weakness, shortness of breath and swelling X 1 week. -In the ED she was noted to be in atrial fibrillation with heart rate in the 120-150 and volume overloaded, chest x-ray noted small bilateral pleural effusion and right middle lung [name], BNP 640, she was given 2 doses of IV Cardizem and then started on p.o. Cardizem along with IV Lasix and admitted  Subjective: Has numerous complaints today, about the quality of her sleep, food options, ER stay etc., can't tell me if her breathing is improving  Assessment and Plan:  * Acute on chronic diastolic CHF (congestive heart failure) (HCC) Moderate to severe mitral regurgitation - Previous echo 1/23, noted EF of 60%, grade 2 DD,, mild PAH, RV dilation and moderate to severe MR  -Continue IV Lasix today, urine output is not charted -Monitor I's/O, daily weights, BMP in a.m.  Atrial fibrillation with rapid ventricular response (HCC) - Heart rate improving, continue diltiazem p.o., Toprol 50 Mg daily -Continue apixaban  History of pulmonary embolism -continue Eliquis.  Obstructive sleep apnea - Reportedly unable to tolerate CPAP  Essential hypertension - Continue Toprol and Cardizem, amlodipine discontinued  GERD without esophagitis - We will continue PPI therapy.  Bronchiectasis ([place]) -Known history of bronchiectasis, chest x-ray with atelectasis and small pleural effusions -  continue Spiriva.   DVT prophylaxis: Apixaban Code Status: DNR Family Communication: Discussed with patient in detail, no family at bedside Disposition Plan: Home likely 1 to 2 days Consultants:  Cardiology  Procedures:   Antimicrobials:    Objective: Vitals:   12/03/21 0245  12/03/21 0400 12/03/21 0420 12/03/21 0445  BP: 109/71 111/72  128/88  Pulse: 65 83  (!) 103  Resp: 20 16  18   Temp:   97.7 F (36.5 C) (!) 97.5 F (36.4 C)  TempSrc:   Oral Oral  SpO2: 95% 95%  95%  Weight:    56.3 kg  Height:    5' 1.5" (1.562 m)   No intake or output data in the 24 hours ending 12/03/21 1225 Filed Weights   12/02/21 1609 12/03/21 0445  Weight: 57.2 kg 56.3 kg    Examination:  General exam: Anxious elderly female sitting up in bed, AAOx3, no distress HEENT: Positive JVD CVS: S1-S2, irregularly irregular rhythm Lungs: Decreased breath sounds the bases otherwise clear Abdomen: Soft, nontender, bowel sounds present Extremities: 2+ edema in both ankles  Skin: No rashes Psychiatry: Judgement and insight appear normal. Mood & affect appropriate.     Data Reviewed:   CBC: Recent Labs  Lab 12/02/21 1752 12/02/21 1805 12/03/21 0532  WBC 7.2  --  6.2  NEUTROABS 4.1  --   --   HGB 13.0 13.9 12.7  HCT 39.9 41.0 38.1  MCV 96.8  --  92.5  PLT 232  --  956   Basic Metabolic Panel: Recent Labs  Lab 12/02/21 1752 12/02/21 1805 12/03/21 0532  NA 129* 129* 135  K 5.2* 5.2* 3.6  CL 99 99 97*  CO2 18*  --  26  GLUCOSE 96 96 95  BUN 10 12 10   CREATININE 0.87 0.90 0.97  CALCIUM 9.3  --  9.5   GFR: Estimated Creatinine Clearance: 31.6 mL/min (by C-G formula based on SCr of  0.97 mg/dL). Liver Function Tests: Recent Labs  Lab 12/02/21 1752  AST 45*  ALT 17  ALKPHOS 52  BILITOT 1.7*  PROT 6.4*  ALBUMIN 3.8   No results for input(s): LIPASE, AMYLASE in the last 168 hours. No results for input(s): AMMONIA in the last 168 hours. Coagulation Profile: No results for input(s): INR, PROTIME in the last 168 hours. Cardiac Enzymes: No results for input(s): CKTOTAL, CKMB, CKMBINDEX, TROPONINI in the last 168 hours. BNP (last 3 results) No results for input(s): PROBNP in the last 8760 hours. HbA1C: No results for input(s): HGBA1C in the last 72  hours. CBG: No results for input(s): GLUCAP in the last 168 hours. Lipid Profile: No results for input(s): CHOL, HDL, LDLCALC, TRIG, CHOLHDL, LDLDIRECT in the last 72 hours. Thyroid Function Tests: No results for input(s): TSH, T4TOTAL, FREET4, T3FREE, THYROIDAB in the last 72 hours. Anemia Panel: No results for input(s): VITAMINB12, FOLATE, FERRITIN, TIBC, IRON, RETICCTPCT in the last 72 hours. Urine analysis:    Component Value Date/Time   COLORURINE STRAW (A) 12/05/2017 2022   APPEARANCEUR CLEAR 12/05/2017 2022   LABSPEC 1.003 (L) 12/05/2017 2022   PHURINE 7.0 12/05/2017 2022   GLUCOSEU NEGATIVE 12/05/2017 2022   HGBUR NEGATIVE 12/05/2017 2022   [place] NEGATIVE 12/05/2017 2022   KETONESUR 5 (A) 12/05/2017 2022   PROTEINUR NEGATIVE 12/05/2017 2022   NITRITE NEGATIVE 12/05/2017 2022   LEUKOCYTESUR NEGATIVE 12/05/2017 2022   Sepsis Labs: @LABRCNTIP (procalcitonin:4,lacticidven:4)  ) Recent Results (from the past 240 hour(s))  Resp Panel by RT-PCR (Flu A&B, Covid) Nasopharyngeal Swab     Status: None   Collection Time: 12/03/21  2:46 AM   Specimen: Nasopharyngeal Swab; Nasopharyngeal(NP) swabs in vial transport medium  Result Value Ref Range Status   SARS Coronavirus 2 by RT PCR NEGATIVE NEGATIVE Final    Comment: (NOTE) SARS-CoV-2 target nucleic acids are NOT DETECTED.  The SARS-CoV-2 RNA is generally detectable in upper respiratory specimens during the acute phase of infection. The lowest concentration of SARS-CoV-2 viral copies this assay can detect is 138 copies/mL. A negative result does not preclude SARS-Cov-2 infection and should not be used as the sole basis for treatment or other patient management decisions. A negative result may occur with  improper specimen collection/handling, submission of specimen other than nasopharyngeal swab, presence of viral mutation(s) within the areas targeted by this assay, and inadequate number of viral copies(<138 copies/mL).  A negative result must be combined with clinical observations, patient history, and epidemiological information. The expected result is Negative.  Fact Sheet for Patients:  EntrepreneurPulse.com.au  Fact Sheet for Healthcare Providers:  IncredibleEmployment.be  This test is no t yet approved or cleared by the [place] FDA and  has been authorized for detection and/or diagnosis of SARS-CoV-2 by FDA under an Emergency Use Authorization (EUA). This EUA will remain  in effect (meaning this test can be used) for the duration of the COVID-19 declaration under Section 564(b)(1) of the Act, 21 U.S.C.section 360bbb-3(b)(1), unless the authorization is terminated  or revoked sooner.       Influenza A by PCR NEGATIVE NEGATIVE Final   Influenza B by PCR NEGATIVE NEGATIVE Final    Comment: (NOTE) The Xpert Xpress SARS-CoV-2/FLU/RSV plus assay is intended as an aid in the diagnosis of influenza from Nasopharyngeal swab specimens and should not be used as a sole basis for treatment. Nasal washings and aspirates are unacceptable for Xpert Xpress SARS-CoV-2/FLU/RSV testing.  Fact Sheet for Patients: EntrepreneurPulse.com.au  Fact Sheet for Healthcare  Providers: IncredibleEmployment.be  This test is not yet approved or cleared by the [place] and has been authorized for detection and/or diagnosis of SARS-CoV-2 by FDA under an Emergency Use Authorization (EUA). This EUA will remain in effect (meaning this test can be used) for the duration of the COVID-19 declaration under Section 564(b)(1) of the Act, 21 U.S.C. section 360bbb-3(b)(1), unless the authorization is terminated or revoked.  Performed at [facility], [place] [address], [place], [place] [address]      Radiology Studies: DG Chest Port 1 View  Result Date: 12/02/2021 CLINICAL DATA:  Palpitations, dyspnea EXAM: PORTABLE CHEST 1 VIEW  COMPARISON:  10/25/2021 chest radiograph. FINDINGS: Low lung volumes. Stable cardiomediastinal silhouette with top-normal heart size. No pneumothorax. Small bilateral pleural effusions. Patchy mild opacity in the right mid lung and at both lung bases. No overt pulmonary edema. IMPRESSION: 1. Low lung volumes. Patchy mild opacities in the right mid lung and at both lung bases, which could represent aspiration, pneumonia or atelectasis. Chest radiograph follow-up advised. 2. Small bilateral pleural effusions. Electronically Signed   By: Ilona Sorrel M.D.   On: 12/02/2021 16:48     Scheduled Meds:  apixaban  5 mg Oral BID   diltiazem  120 mg Oral Daily   furosemide  40 mg Intravenous Q12H   metoprolol succinate  50 mg Oral Daily   umeclidinium bromide  1 puff Inhalation Daily   Continuous Infusions:   LOS: 1 day    Time spent: 13mn  PDomenic Polite MD [facility]   12/03/2021, 12:25 PM

## 2021-12-03 NOTE — Telephone Encounter (Signed)
Patient is currently in the hospital.

## 2021-12-03 NOTE — ED Notes (Signed)
ED TO INPATIENT HANDOFF REPORT  ED Nurse Name and Phone #: Teodoro Spray [phone]  S Name/Age/Gender Janet Nguyen 88 y.o. female Room/Bed: 039C/039C  Code Status   Code Status: DNR  Home/SNF/Other Home Patient oriented to: self, place, and time Is this baseline? Yes   Triage Complete: Triage complete  Chief Complaint Acute CHF (congestive heart failure) ([place]) [I50.9]  Triage Note Pt BIB [facility] from [place] after assessment at their office revealed pt is in afib. Patient visited their office for weakness and swelling x1 week. Patient has hx of aflutter per EMS. HR between 116-156 en route. Pt is A&Ox4 and NAD at this time.   Allergies Allergies  Allergen Reactions   Gluten Meal Other (See Comments)    Unknown   Naproxen Other (See Comments)    Stomach upset    Level of Care/Admitting Diagnosis ED Disposition     ED Disposition  Admit   Condition  --   [place]: [place] [[id number]]  Level of Care: Telemetry Cardiac [[id number]]  May admit patient to [facility] or [facility] if equivalent level of care is available:: No  Covid Evaluation: Asymptomatic Screening Protocol (No Symptoms)  Diagnosis: Acute CHF (congestive heart failure) [facility]) [[id number]]  Admitting Physician: Christel Mormon [[id number]]  Attending Physician: Christel Mormon [[id number]]  Estimated length of stay: past midnight tomorrow  Certification:: I certify this patient will need inpatient services for at least 2 midnights          B Medical/Surgery History Past Medical History:  Diagnosis Date   Arrhythmia    History of SVT with documented PVC'S and  PAC'S  12/08/12 Nuc stress test normal LV EF 74%  Event Monitor  12/01/12-01/03/13   Atrial flutter ([place])    Celiac disease    treated by Dr. [name]   GERD (gastroesophageal reflux disease)    Intervertebral disc stenosis of neural canal of cervical region    Irregular heart beat 11/30/12   ECHO-EF 60-65%   Osteoporosis     PMR (polymyalgia rheumatica) (HCC)    Dr. Marijean Bravo; pt states she was diagnosed 10-15 years ago, not treated at this time or any issues that she is aware of.   Scoliosis    Scoliosis    Sleep apnea 10/02/11 [facility]   Sleep study AHI -total sleep 10.3/hr  64.0/ hr during REM sleep.RDI 22.8/hr during total sleep 64.0/hr during REM sleep The lowest O2 sat during Non-REM and REM sleep was 86% and 88% respectively. 04/08/12 CPAP/BIPAP titration study [facility]   Past Surgical History:  Procedure Laterality Date   APPENDECTOMY     ruptured at age 56 and had surgery   CARDIAC CATHETERIZATION  01/27/06   cataract surgery  2015   Dr. Herbert Deaner; March & April 2015     A IV Location/Drains/Wounds Patient Lines/Drains/Airways Status     Active Line/Drains/Airways     Name Placement date Placement time Site Days   Peripheral IV 12/02/21 20 G Left Antecubital 12/02/21  1735  Antecubital  1            Intake/Output Last 24 hours No intake or output data in the 24 hours ending 12/03/21 7035  Labs/Imaging Results for orders placed or performed during the hospital encounter of 12/02/21 (from the past 48 hour(s))  CBC with Differential/Platelet     Status: None   Collection Time: 12/02/21  5:52 PM  Result Value Ref  Range   WBC 7.2 4.0 - 10.5 K/uL   RBC 4.12 3.87 - 5.11 MIL/uL   Hemoglobin 13.0 12.0 - 15.0 g/dL   HCT 39.9 36.0 - 46.0 %   MCV 96.8 80.0 - 100.0 fL   MCH 31.6 26.0 - 34.0 pg   MCHC 32.6 30.0 - 36.0 g/dL   RDW 13.2 11.5 - 15.5 %   Platelets 232 150 - 400 K/uL   nRBC 0.0 0.0 - 0.2 %   Neutrophils Relative % 57 %   Neutro Abs 4.1 1.7 - 7.7 K/uL   Lymphocytes Relative 26 %   Lymphs Abs 1.9 0.7 - 4.0 K/uL   Monocytes Relative 14 %   Monocytes Absolute 1.0 0.1 - 1.0 K/uL   Eosinophils Relative 2 %   Eosinophils Absolute 0.1 0.0 - 0.5 K/uL   Basophils Relative 1 %   Basophils Absolute 0.1 0.0 - 0.1 K/uL   Immature Granulocytes 0 %    Abs Immature Granulocytes 0.03 0.00 - 0.07 K/uL    Comment: Performed at [facility] [address] [place], [place] [address]  Comprehensive metabolic panel     Status: Abnormal   Collection Time: 12/02/21  5:52 PM  Result Value Ref Range   Sodium 129 (L) 135 - 145 mmol/L   Potassium 5.2 (H) 3.5 - 5.1 mmol/L    Comment: SLIGHT HEMOLYSIS   Chloride 99 98 - 111 mmol/L   CO2 18 (L) 22 - 32 mmol/L   Glucose, Bld 96 70 - 99 mg/dL    Comment: Glucose reference range applies only to samples taken after fasting for at least 8 hours.   BUN 10 8 - 23 mg/dL   Creatinine, Ser 0.87 0.44 - 1.00 mg/dL   Calcium 9.3 8.9 - 10.3 mg/dL   Total Protein 6.4 (L) 6.5 - 8.1 g/dL   Albumin 3.8 3.5 - 5.0 g/dL   AST 45 (H) 15 - 41 U/L   ALT 17 0 - 44 U/L   Alkaline Phosphatase 52 38 - 126 U/L   Total Bilirubin 1.7 (H) 0.3 - 1.2 mg/dL   GFR, Estimated >60 >60 mL/min    Comment: (NOTE) Calculated using the CKD-EPI Creatinine Equation (2021)    Anion gap 12 5 - 15    Comment: Performed at [facility], [place] [address], [place], [place] [address]  Troponin I (High Sensitivity)     Status: None   Collection Time: 12/02/21  5:52 PM  Result Value Ref Range   Troponin I (High Sensitivity) 9 <18 ng/L    Comment: (NOTE) Elevated high sensitivity troponin I (hsTnI) values and significant  changes across serial measurements may suggest ACS but many other  chronic and acute conditions are known to elevate hsTnI results.  Refer to the Links section for chest pain algorithms and additional  guidance. Performed at [facility], [place] [address] [place], [place] [address]   Brain natriuretic peptide     Status: Abnormal   Collection Time: 12/02/21  5:52 PM  Result Value Ref Range   B Natriuretic Peptide 640.0 (H) 0.0 - 100.0 pg/mL    Comment: Performed at [facility] [address] [place], [place] [address]  I-stat chem 8, ED (not at [facility] or [facility])     Status: Abnormal   Collection  Time: 12/02/21  6:05 PM  Result Value Ref Range   Sodium 129 (L) 135 - 145 mmol/L   Potassium 5.2 (H) 3.5 -  5.1 mmol/L   Chloride 99 98 - 111 mmol/L   BUN 12 8 - 23 mg/dL   Creatinine, Ser 0.90 0.44 - 1.00 mg/dL   Glucose, Bld 96 70 - 99 mg/dL    Comment: Glucose reference range applies only to samples taken after fasting for at least 8 hours.   Calcium, Ion 1.09 (L) 1.15 - 1.40 mmol/L   TCO2 25 22 - 32 mmol/L   Hemoglobin 13.9 12.0 - 15.0 g/dL   HCT 41.0 36.0 - 46.0 %  Troponin I (High Sensitivity)     Status: None   Collection Time: 12/02/21  7:15 PM  Result Value Ref Range   Troponin I (High Sensitivity) 10 <18 ng/L    Comment: (NOTE) Elevated high sensitivity troponin I (hsTnI) values and significant  changes across serial measurements may suggest ACS but many other  chronic and acute conditions are known to elevate hsTnI results.  Refer to the "Links" section for chest pain algorithms and additional  guidance. Performed at [facility], [place] [address] [place], [place] [address]   Resp Panel by RT-PCR (Flu A&B, Covid) Nasopharyngeal Swab     Status: None   Collection Time: 12/03/21  2:46 AM   Specimen: Nasopharyngeal Swab; Nasopharyngeal(NP) swabs in vial transport medium  Result Value Ref Range   SARS Coronavirus 2 by RT PCR NEGATIVE NEGATIVE    Comment: (NOTE) SARS-CoV-2 target nucleic acids are NOT DETECTED.  The SARS-CoV-2 RNA is generally detectable in upper respiratory specimens during the acute phase of infection. The lowest concentration of SARS-CoV-2 viral copies this assay can detect is 138 copies/mL. A negative result does not preclude SARS-Cov-2 infection and should not be used as the sole basis for treatment or other patient management decisions. A negative result may occur with  improper specimen collection/handling, submission of specimen other than nasopharyngeal swab, presence of viral mutation(s) within the areas targeted by this assay, and  inadequate number of viral copies(<138 copies/mL). A negative result must be combined with clinical observations, patient history, and epidemiological information. The expected result is Negative.  Fact Sheet for Patients:  EntrepreneurPulse.com.au  Fact Sheet for Healthcare Providers:  IncredibleEmployment.be  This test is no t yet approved or cleared by the [place] FDA and  has been authorized for detection and/or diagnosis of SARS-CoV-2 by FDA under an Emergency Use Authorization (EUA). This EUA will remain  in effect (meaning this test can be used) for the duration of the COVID-19 declaration under Section 564(b)(1) of the Act, 21 U.S.C.section 360bbb-3(b)(1), unless the authorization is terminated  or revoked sooner.       Influenza A by PCR NEGATIVE NEGATIVE   Influenza B by PCR NEGATIVE NEGATIVE    Comment: (NOTE) The Xpert Xpress SARS-CoV-2/FLU/RSV plus assay is intended as an aid in the diagnosis of influenza from Nasopharyngeal swab specimens and should not be used as a sole basis for treatment. Nasal washings and aspirates are unacceptable for Xpert Xpress SARS-CoV-2/FLU/RSV testing.  Fact Sheet for Patients: EntrepreneurPulse.com.au  Fact Sheet for Healthcare Providers: IncredibleEmployment.be  This test is not yet approved or cleared by the [place] FDA and has been authorized for detection and/or diagnosis of SARS-CoV-2 by FDA under an Emergency Use Authorization (EUA). This EUA will remain in effect (meaning this test can be used) for the duration of the COVID-19 declaration under Section 564(b)(1) of the Act, 21 U.S.C. section 360bbb-3(b)(1), unless the authorization is terminated or revoked.  Performed at [facility],  [address], [place], [place] [address]    DG Chest Port 1 View  Result Date: 12/02/2021 CLINICAL DATA:  Palpitations, dyspnea EXAM: PORTABLE  CHEST 1 VIEW COMPARISON:  10/25/2021 chest radiograph. FINDINGS: Low lung volumes. Stable cardiomediastinal silhouette with top-normal heart size. No pneumothorax. Small bilateral pleural effusions. Patchy mild opacity in the right mid lung and at both lung bases. No overt pulmonary edema. IMPRESSION: 1. Low lung volumes. Patchy mild opacities in the right mid lung and at both lung bases, which could represent aspiration, pneumonia or atelectasis. Chest radiograph follow-up advised. 2. Small bilateral pleural effusions. Electronically Signed   By: Ilona Sorrel M.D.   On: 12/02/2021 16:48    Pending Labs Unresulted Labs (From admission, onward)     Start     Ordered   12/03/21 4920  Basic metabolic panel  Tomorrow morning,   R        12/02/21 1945   12/03/21 0500  CBC  Tomorrow morning,   R        12/02/21 1945            Vitals/Pain Today's Vitals   12/02/21 1845 12/02/21 2200 12/03/21 0000 12/03/21 0245  BP: 121/86 (!) 118/91 114/85 109/71  Pulse: 88 (!) 112 (!) 110 65  Resp: (!) 21 17 13 20   Temp:      TempSrc:      SpO2: 94% 94% 93% 95%  Weight:      Height:      PainSc:        Isolation Precautions No active isolations  Medications Medications  traMADol (ULTRAM) tablet 50 mg (has no administration in time range)  metoprolol succinate (TOPROL-XL) 24 hr tablet 50 mg (has no administration in time range)  ALPRAZolam (XANAX) tablet 0.5 mg (0.25 mg Oral Given 12/02/21 2345)  polyethylene glycol (MIRALAX / GLYCOLAX) packet 17 g (has no administration in time range)  apixaban (ELIQUIS) tablet 5 mg (5 mg Oral Given 12/02/21 2345)  furosemide (LASIX) injection 40 mg (has no administration in time range)  acetaminophen (TYLENOL) tablet 650 mg (has no administration in time range)    Or  acetaminophen (TYLENOL) suppository 650 mg (has no administration in time range)  traZODone (DESYREL) tablet 25 mg (has no administration in time range)  magnesium hydroxide (MILK OF MAGNESIA)  suspension 30 mL (has no administration in time range)  ondansetron (ZOFRAN) tablet 4 mg (has no administration in time range)    Or  ondansetron (ZOFRAN) injection 4 mg (has no administration in time range)  diltiazem (CARDIZEM CD) 24 hr capsule 120 mg (120 mg Oral Given 12/02/21 2046)  umeclidinium bromide (INCRUSE ELLIPTA) 62.5 MCG/ACT 1 puff (has no administration in time range)  diltiazem (CARDIZEM) injection 10 mg (10 mg Intravenous Given 12/02/21 1741)  furosemide (LASIX) injection 40 mg (40 mg Intravenous Given 12/02/21 1912)    Mobility walks Low fall risk   Focused Assessments Cardiac Assessment Handoff:    Lab Results  Component Value Date   TROPONINI <0.03 07/01/2017   Lab Results  Component Value Date   DDIMER 1.53 (H) 10/22/2021   Does the Patient currently have chest pain? No    R Recommendations: See Admitting Provider Note  Report given to:   Additional Notes:

## 2021-12-03 NOTE — Plan of Care (Signed)
?  Problem: Cardiac: ?Goal: Ability to achieve and maintain adequate cardiopulmonary perfusion will improve ?Outcome: Progressing ?  ?Problem: Education: ?Goal: Ability to demonstrate management of disease process will improve ?Outcome: Progressing ?  ?Problem: Activity: ?Goal: Capacity to carry out activities will improve ?Outcome: Progressing ?  ?

## 2021-12-03 NOTE — Assessment & Plan Note (Addendum)
No clinical signs of exacerbation, continue with bronchodilator therapy.  ?

## 2021-12-04 ENCOUNTER — Other Ambulatory Visit (HOSPITAL_COMMUNITY): Payer: Self-pay

## 2021-12-04 DIAGNOSIS — Z86711 Personal history of pulmonary embolism: Secondary | ICD-10-CM

## 2021-12-04 DIAGNOSIS — J479 Bronchiectasis, uncomplicated: Secondary | ICD-10-CM

## 2021-12-04 DIAGNOSIS — I2699 Other pulmonary embolism without acute cor pulmonale: Secondary | ICD-10-CM

## 2021-12-04 DIAGNOSIS — E871 Hypo-osmolality and hyponatremia: Secondary | ICD-10-CM | POA: Diagnosis present

## 2021-12-04 LAB — BASIC METABOLIC PANEL
Anion gap: 10 (ref 5–15)
BUN: 12 mg/dL (ref 8–23)
CO2: 28 mmol/L (ref 22–32)
Calcium: 9.4 mg/dL (ref 8.9–10.3)
Chloride: 97 mmol/L — ABNORMAL LOW (ref 98–111)
Creatinine, Ser: 1.13 mg/dL — ABNORMAL HIGH (ref 0.44–1.00)
GFR, Estimated: 47 mL/min — ABNORMAL LOW (ref >60)
Glucose, Bld: 95 mg/dL (ref 70–99)
Potassium: 4.1 mmol/L (ref 3.5–5.1)
Sodium: 135 mmol/L (ref 135–145)

## 2021-12-04 MED ORDER — DILTIAZEM HCL ER COATED BEADS 120 MG PO CP24: 120.0000 mg | ORAL_CAPSULE | Freq: Every day | ORAL | 0 refills | Status: DC

## 2021-12-04 MED ORDER — FUROSEMIDE 20 MG PO TABS: 20.0000 mg | ORAL_TABLET | Freq: Every day | ORAL | 0 refills | Status: DC

## 2021-12-04 MED ORDER — POTASSIUM CHLORIDE CRYS ER 10 MEQ PO TBCR: 10.0000 meq | EXTENDED_RELEASE_TABLET | Freq: Every day | ORAL | 0 refills | Status: DC

## 2021-12-04 MED ORDER — FUROSEMIDE 40 MG PO TABS
40.0000 mg | ORAL_TABLET | Freq: Every day | ORAL | Status: DC
  Filled 2021-12-04: qty 1

## 2021-12-04 MED ORDER — FUROSEMIDE 20 MG PO TABS: 20.0000 mg | ORAL_TABLET | Freq: Every day | ORAL | Status: DC

## 2021-12-04 MED ORDER — METOPROLOL SUCCINATE ER 25 MG PO TB24
25.0000 mg | ORAL_TABLET | Freq: Once | ORAL | Status: AC
  Administered 2021-12-04: 25 mg via ORAL
  Filled 2021-12-04: qty 1

## 2021-12-04 MED ORDER — METOPROLOL SUCCINATE ER 25 MG PO TB24
75.0000 mg | ORAL_TABLET | Freq: Every day | ORAL | 0 refills | Status: DC
Start: 2021-12-05 — End: 2021-12-18

## 2021-12-04 MED ORDER — FUROSEMIDE 40 MG PO TABS
40.0000 mg | ORAL_TABLET | Freq: Every day | ORAL | Status: DC
Start: 2021-12-05 — End: 2021-12-04

## 2021-12-04 MED ORDER — POTASSIUM CHLORIDE CRYS ER 10 MEQ PO TBCR: 10.0000 meq | EXTENDED_RELEASE_TABLET | Freq: Every day | ORAL | Status: DC

## 2021-12-04 MED ORDER — METOPROLOL SUCCINATE ER 50 MG PO TB24: 75.0000 mg | ORAL_TABLET | Freq: Every day | ORAL | Status: DC

## 2021-12-04 NOTE — Progress Notes (Signed)
Progress Note  Patient Name: Janet Nguyen Date of Encounter: 12/04/2021  [facility] Cardiologist: Shelva Majestic, MD   Subjective   Feeling much better. Very anxious to leave the hospital. HR currently elevated in 120s  Remains in Afib  Cr 0.97>1.13 Net negative 1.6L Wt 124>114lbs  Inpatient Medications    Scheduled Meds:  apixaban  5 mg Oral BID   diltiazem  120 mg Oral Daily   furosemide  40 mg Intravenous Q12H   metoprolol succinate  50 mg Oral Daily   potassium chloride  30 mEq Oral BID   umeclidinium bromide  1 puff Inhalation Daily   Continuous Infusions:  PRN Meds: acetaminophen **OR** acetaminophen, ALPRAZolam, magnesium hydroxide, ondansetron **OR** ondansetron (ZOFRAN) IV, polyethylene glycol, traMADol, traZODone   Vital Signs    Vitals:   12/03/21 2047 12/04/21 0047 12/04/21 0500 12/04/21 0546  BP: (!) 113/91 105/72  100/81  Pulse: 91   89  Resp: _0 Temp: 98.1 F (36.7 C) 98 F (36.7 C)  97.8 F (36.6 C)  TempSrc: Oral Oral  Axillary  SpO2: 97% 98%  97%  Weight:   51.9 kg   Height:        Intake/Output Summary (Last 24 hours) at 12/04/2021 0745 Last data filed at 12/04/2021 0600 Gross per 24 hour  Intake 240 ml  Output 1800 ml  Net -1560 ml   Last 3 Weights 12/04/2021 12/03/2021 12/02/2021  Weight (lbs) 114 lb 8 oz 124 lb 1.9 oz 126 lb  Weight (kg) 51.937 kg 56.3 kg 57.153 kg      Telemetry    Afib with HR 70-130s - Personally Reviewed  ECG    No new tracing- Personally Reviewed  Physical Exam   GEN: Elderly female, comfortbale   Neck: No significant JVD Cardiac: Irregular, 2/6 systolic murmur  Respiratory: Diminished but clear GI: Soft, nontender, non-distended  MS: 2+ pedal edema on left; 1+ on right (improved), warm Neuro:  Nonfocal  Psych: Normal affect   Labs    High Sensitivity Troponin:   Recent Labs  Lab 12/02/21 1752 12/02/21 1915 12/03/21 0532  TROPONINIHS _1 Chemistry Recent Labs  Lab  12/02/21 1752 12/02/21 1805 12/03/21 0532 12/04/21 0537  NA 129* 129* 135 135  K 5.2* 5.2* 3.6 4.1  CL 99 99 97* 97*  CO2 18*  --  26 28  GLUCOSE 96 96 95 95  BUN _2 CREATININE 0.87 0.90 0.97 1.13*  CALCIUM 9.3  --  9.5 9.4  PROT 6.4*  --   --   --   ALBUMIN 3.8  --   --   --   AST 45*  --   --   --   ALT 17  --   --   --   ALKPHOS 52  --   --   --   BILITOT 1.7*  --   --   --   GFRNONAA >60  --  57* 47*  ANIONGAP 12  --  12 10     Lipids No results for input(s): CHOL, TRIG, HDL, LABVLDL, LDLCALC, CHOLHDL in the last 168 hours.  Hematology Recent Labs  Lab 12/02/21 1752 12/02/21 1805 12/03/21 0532  WBC 7.2  --  6.2  RBC 4.12  --  4.12  HGB 13.0 13.9 12.7  HCT 39.9 41.0 38.1  MCV 96.8  --  92.5  MCH 31.6  --  30.8  MCHC 32.6  --  33.3  RDW 13.2  --  13.1  PLT 232  --  250    Thyroid No results for input(s): TSH, FREET4 in the last 168 hours.  BNP Recent Labs  Lab 12/02/21 1752  BNP 640.0*     DDimer No results for input(s): DDIMER in the last 168 hours.   Radiology    DG Chest Port 1 View  Result Date: 12/02/2021 CLINICAL DATA:  Palpitations, dyspnea EXAM: PORTABLE CHEST 1 VIEW COMPARISON:  10/25/2021 chest radiograph. FINDINGS: Low lung volumes. Stable cardiomediastinal silhouette with top-normal heart size. No pneumothorax. Small bilateral pleural effusions. Patchy mild opacity in the right mid lung and at both lung bases. No overt pulmonary edema. IMPRESSION: 1. Low lung volumes. Patchy mild opacities in the right mid lung and at both lung bases, which could represent aspiration, pneumonia or atelectasis. Chest radiograph follow-up advised. 2. Small bilateral pleural effusions. Electronically Signed   By: Ilona Sorrel M.D.   On: 12/02/2021 16:48   ECHOCARDIOGRAM LIMITED  Result Date: 12/03/2021    ECHOCARDIOGRAM LIMITED REPORT   Patient Name:   HAMNA ASA [facility] Date of Exam: 12/03/2021 Medical Rec #:  [id number]         Height:       61.5 in  Accession #:    [id number]        Weight:       124.1 lb Date of Birth:  1932/01/27         BSA:          1.551 m Patient Age:    88 years          BP:           128/88 mmHg Patient Gender: F                 HR:           79 bpm. Exam Location:  Inpatient Procedure: Limited Echo, Cardiac Doppler, Color Doppler and 3D Echo Indications:    I50.40* Unspecified combined systolic (congestive) and diastolic                 (congestive) heart failure; I26.02 Pulmonary embolus  History:        Patient has prior history of Echocardiogram examinations, most                 recent 10/15/2021. CHF, Abnormal ECG, Mitral Valve Disease and                 Mitral Valve Prolapse, Arrythmias:Atrial Fibrillation, Atrial                 Flutter and PVC, Signs/Symptoms:Chest Pain; Risk                 Factors:Hypertension and Sleep Apnea.  Sonographer:    Roseanna Rainbow RDCS Referring Phys: [id number] [place]  1. Left ventricular ejection fraction, by estimation, is 60 to 65%. Left ventricular ejection fraction by 2D MOD biplane is 60.8 %. The left ventricle has normal function. The left ventricle has no regional wall motion abnormalities. There is mild left ventricular hypertrophy. Left ventricular diastolic function could not be evaluated.  2. Left atrial size was moderately dilated.  3. Right atrial size was moderately dilated.  4. The pericardial effusion is circumferential. There is no evidence of cardiac tamponade.  5. The mitral valve is myxomatous. Moderate mitral valve regurgitation. There is moderate late systolic prolapse of the  middle scallop of the posterior leaflet of the mitral valve.  6. Tricuspid valve regurgitation is moderate.  7. The aortic valve is tricuspid. Aortic valve sclerosis is present, with no evidence of aortic valve stenosis.  8. There is normal pulmonary artery systolic pressure. The estimated right ventricular systolic pressure is 56.2 mmHg.  9. The inferior vena cava is dilated in size  with >50% respiratory variability, suggesting right atrial pressure of 8 mmHg. Comparison(s): Changes from prior study are noted. 10/15/2021: LVEF 63%, moderate to severe MR with posterior leaflet prolapse, grade 2 DD. FINDINGS  Left Ventricle: Left ventricular ejection fraction, by estimation, is 60 to 65%. Left ventricular ejection fraction by 2D MOD biplane is 60.8 %. The left ventricle has normal function. The left ventricle has no regional wall motion abnormalities. 3D left ventricular ejection fraction analysis performed but not reported based on interpreter judgement due to suboptimal tracking. The left ventricular internal cavity size was normal in size. There is mild left ventricular hypertrophy. Left ventricular diastolic function could not be evaluated. Left ventricular diastolic function could not be evaluated due to atrial fibrillation. Right Ventricle: There is normal pulmonary artery systolic pressure. The tricuspid regurgitant velocity is 2.57 m/s, and with an assumed right atrial pressure of 8 mmHg, the estimated right ventricular systolic pressure is 13.0 mmHg. Left Atrium: Left atrial size was moderately dilated. Right Atrium: Right atrial size was moderately dilated. Pericardium: Trivial pericardial effusion is present. The pericardial effusion is circumferential. There is no evidence of cardiac tamponade. Mitral Valve: The mitral valve is myxomatous. There is moderate late systolic prolapse of the middle scallop of the posterior leaflet of the mitral valve. There is moderate thickening of the anterior and posterior mitral valve leaflet(s). Moderate mitral  valve regurgitation, with eccentric anteriorly directed jet. Tricuspid Valve: The tricuspid valve is grossly normal. Tricuspid valve regurgitation is moderate. Aortic Valve: The aortic valve is tricuspid. Aortic valve sclerosis is present, with no evidence of aortic valve stenosis. Pulmonic Valve: The pulmonic valve was grossly normal. Pulmonic  valve regurgitation is trivial. Aorta: The aortic root and ascending aorta are structurally normal, with no evidence of dilitation. Venous: The inferior vena cava is dilated in size with greater than 50% respiratory variability, suggesting right atrial pressure of 8 mmHg. Additional Comments: Mild ascites is present. LEFT VENTRICLE PLAX 2D                        Biplane EF (MOD) LVIDd:         3.80 cm         LV Biplane EF:   Left LVIDs:         2.70 cm                          ventricular LV PW:         1.10 cm                          ejection LV IVS:        0.85 cm                          fraction by LVOT diam:     1.90 cm                          2D MOD LVOT  Area:     2.84 cm                         biplane is                                                 60.8 %.  LV Volumes (MOD) LV vol d, MOD    74.9 ml A2C: LV vol d, MOD    59.0 ml A4C:                           3D Volume EF: LV vol s, MOD    28.6 ml       3D EF:        57 % A2C:                           LV EDV:       59 ml LV vol s, MOD    23.6 ml       LV ESV:       25 ml A4C:                           LV SV:        34 ml LV SV MOD A2C:   46.3 ml LV SV MOD A4C:   59.0 ml LV SV MOD BP:    40.7 ml LEFT ATRIUM           Index        RIGHT ATRIUM           Index LA Vol (A2C): 70.2 ml 45.25 ml/m  RA Area:     20.10 cm                                    RA Volume:   64.80 ml  41.77 ml/m   AORTA Ao Asc diam: 3.10 cm MR Peak grad:    87.6 mmHg    TRICUSPID VALVE MR Mean grad:    60.0 mmHg    TR Peak grad:   26.4 mmHg MR Vmax:         468.00 cm/s  TR Vmax:        257.00 cm/s MR Vmean:        369.0 cm/s MR PISA:         1.57 cm     SHUNTS MR PISA Eff ROA: 13 mm       Systemic Diam: 1.90 cm MR PISA Radius:  0.50 cm Lyman Bishop MD Electronically signed by Lyman Bishop MD Signature Date/Time: 12/03/2021/4:40:08 PM    Final     Cardiac Studies   echocardiogram 10/15/21 IMPRESSIONS     1. Mitral regurgitation has advanced since prior study.   2. Left  ventricular ejection fraction by 3D volume is 63 %. The left  ventricle has normal function. The left ventricle has no regional wall  motion abnormalities. Left ventricular diastolic parameters are consistent  with Grade II diastolic dysfunction  (pseudonormalization). The average left ventricular global longitudinal  strain is -12.0 %. The global longitudinal strain is abnormal.   3. Right ventricular  systolic function is normal. The right ventricular  size is normal. There is mildly elevated pulmonary artery systolic  pressure. The estimated right ventricular systolic pressure is 10.2 mmHg.   4. Left atrial size was severely dilated.   5. Right atrial size was moderately dilated.   6. The mitral valve is degenerative. Moderate to severe mitral valve  regurgitation. No evidence of mitral stenosis. There is moderate  holosystolic prolapse of the middle scallop of the posterior leaflet of  the mitral valve.   7. Tricuspid valve regurgitation is moderate.   8. The aortic valve is normal in structure. Aortic valve regurgitation is  not visualized. No aortic stenosis is present.   9. The inferior vena cava is dilated in size with <50% respiratory  variability, suggesting right atrial pressure of 15 mmHg.   Comparison(s): Prior images reviewed side by side. EF 55%, MAC, mild MR in  2018.   FINDINGS   Left Ventricle: Left ventricular ejection fraction by 3D volume is 63 %.  The left ventricle has normal function. The left ventricle has no regional  wall motion abnormalities. The average left ventricular global  longitudinal strain is -12.0 %. The global   longitudinal strain is abnormal. The left ventricular internal cavity  size was normal in size. There is no left ventricular hypertrophy. Left  ventricular diastolic parameters are consistent with Grade II diastolic  dysfunction (pseudonormalization).   Right Ventricle: The right ventricular size is normal. No increase in  right  ventricular wall thickness. Right ventricular systolic function is  normal. There is mildly elevated pulmonary artery systolic pressure. The  tricuspid regurgitant velocity is 2.96   m/s, and with an assumed right atrial pressure of 3 mmHg, the estimated  right ventricular systolic pressure is 58.5 mmHg.   Left Atrium: Left atrial size was severely dilated.   Right Atrium: Right atrial size was moderately dilated.   Pericardium: There is no evidence of pericardial effusion.   Mitral Valve: The mitral valve is degenerative in appearance. There is  moderate holosystolic prolapse of the middle scallop of the posterior  leaflet of the mitral valve. There is moderate thickening of the mitral  valve leaflet(s). There is mild  calcification of the mitral valve leaflet(s). Mild mitral annular  calcification. Moderate to severe mitral valve regurgitation, with  anteriorly-directed jet. No evidence of mitral valve stenosis.   Tricuspid Valve: The tricuspid valve is normal in structure. Tricuspid  valve regurgitation is moderate . No evidence of tricuspid stenosis.   Aortic Valve: The aortic valve is normal in structure. Aortic valve  regurgitation is not visualized. No aortic stenosis is present. Aortic  valve mean gradient measures 3.0 mmHg. Aortic valve peak gradient measures  5.8 mmHg. Aortic valve area, by VTI  measures 2.67 cm.   Pulmonic Valve: The pulmonic valve was normal in structure. Pulmonic valve  regurgitation is not visualized. No evidence of pulmonic stenosis.   Aorta: The aortic root is normal in size and structure.   Venous: The inferior vena cava is dilated in size with less than 50%  respiratory variability, suggesting right atrial pressure of 15 mmHg.   IAS/Shunts: No atrial level shunt detected by color flow Doppler.   Additional Comments: Mitral regurgitation has advanced since prior study.    Recent + VQ scan   Patient Profile     88 y.o. female SVT, atrial  flutter previously not on AC, OSA, PMR, celiac disease, scoliosis and GERD who presented to her PCP office on 3/6  with weakness and LE edema found to be in aflutter with RVR with evidence of volume overload prompting admission to [facility] hospital. Cardiology is consulted for management of Afib and diastolic heart failure.   Assessment & Plan    #Afib with RVR: CHADs-vasc 4. Patient with known history of Aflutter not previously on [facility]. Presented to PCP office with worsening weakness and LE edema found to be in Aflutter with RVR. Now with improved rates since starting dilt and metop. -Continue dilt 111m daily -Increase metop to 7560mXL daily -Continue apxiaban 60m21mID for AC -If HR better controlled this afternoon, okay to d/c from CV perspective  #Acute on Chronic Diastolic HF Exacerbation: Presented to PCP office with weight gain and LE edema found to be in Afib with RVR as above with evidence of volume overload prompting admission to [facility]. BNP 640 with small pleural effusions on CXR. TTE 12/03/21 with LVEF 60-65%. -Transition to lasix 54m58m daily -Continue spironolactone 12.60mg 38mly -Consider SGLT2i as out-patient -Monitor I/Os and daily weights -Low Na deit  #PE: TTE with preserved RV function and normal PASP. -Continue apixaban 60mg B63m #Moderate to Severe MR: #Myxomatous MV Disease with MVP: Appears moderate on TTE 12/04/21 after diuresis. Will need continued monitoring as out-patient.  -Continue diuresis as above -Will need continued monitoring  #HTN: Controlled.  -Continue dilt 120mg d20m -Increase metop to 760mg XL88mly   If HR better this afternoon with increased dose of metop, okay to discharge from CV perspective as patient really would like to go home. Will arrange for CV follow-up.     For questions or updates, please contact [facility] [email or web address] for contact info under        Signed, Axten Pascucci Freada Bergeron8/2023, 7:45 AM

## 2021-12-04 NOTE — Hospital Course (Signed)
Janet Nguyen was admitted to the hospital with the working diagnosis of decompensated heart failure.  ? ?88 yo female with the past medical history of heart failure, atrial flutter, pulmonary embolism, and celiac disease who presented with palpitations. Reported worsening lower extremity edema, and dyspnea over last 7 days prior to admission. As outpatient she was found in atrial fibrillation with rapid ventricular response and she was referred to the ED. On her initial physical examination her HR was 118 -126 bpm. Blood pressure 121/86, RR 21 and 02 saturation 94%. Lungs with decreased breath sounds bilaterally, no wheezing, heart with S1 and S2 present, irregularly irregular with no gallop, no murmurs, abdomen soft and positive lower extremity edema.  ? ?Na 126, K 5,2 CL 99, bicarb 18, glcuose 96 bun 10 and cr 0,87  ?BNP 604 ?High sensitive troponin 9 and 10 ?Wbc 7.2, hgb 13,0 hvt 39,9 plt 232  ?Sars covid 19 negative  ? ?Chest radiograph with cardiomegaly, bilateral atelectasis and small bilateral pleural effusions.  ? ?EKG 126 bpm, normal axis, normal qtc, atrial fibrillation with q wave V1 and V2 with no significant ST segment or T wave changes.  ? ?Patient was placed on furosemide for diuresis.  ?Placed on AV blockade for rate control.  ? ?

## 2021-12-04 NOTE — Telephone Encounter (Signed)
[place] financial assistance to check the status of her application for eliquis.  I was informed that her application was denied on 11/25/21 d/t being over the income limit based on the documentation submitted.  She is currently in the hospital, admitted on 3/6 for CHF.  I will route to Rexene Edison NP and Dr. Chase Caller. ?

## 2021-12-04 NOTE — Discharge Summary (Signed)
Physician Discharge Summary   Patient: Janet Nguyen MRN: [id number] DOB: 06-May-1932  Admit date:     12/02/2021  Discharge date: 12/04/21  Discharge Physician: Jimmy Picket Levaughn Puccinelli   PCP: Lawerance Cruel, MD   Recommendations at discharge:    Patient has been placed on diltiazem and dose of metoprolol has been increased to 75 mg succinate Continue anticoagulation with apixaban Diuresis with furosemide and added KCL supplementation Follow up renal function and electrolytes as outpatient.   Discharge Diagnoses: Principal Problem:   Acute on chronic diastolic CHF (congestive heart failure) (HCC) Active Problems:   Atrial fibrillation with rapid ventricular response (HCC)   History of pulmonary embolism   Essential hypertension   Obstructive sleep apnea   GERD without esophagitis   Bronchiectasis ([place])  Resolved Problems:   * No resolved hospital problems. [facility] Course: Janet Nguyen was admitted to the hospital with the working diagnosis of decompensated heart failure.   88 yo female with the past medical history of heart failure, atrial flutter, pulmonary embolism, and celiac disease who presented with palpitations. Reported worsening lower extremity edema, and dyspnea over last 7 days prior to admission. As outpatient she was found in atrial fibrillation with rapid ventricular response and she was referred to the ED. On her initial physical examination her HR was 118 -126 bpm. Blood pressure 121/86, RR 21 and 02 saturation 94%. Lungs with decreased breath sounds bilaterally, no wheezing, heart with S1 and S2 present, irregularly irregular with no gallop, no murmurs, abdomen soft and positive lower extremity edema.   Na 126, K 5,2 CL 99, bicarb 18, glcuose 96 bun 10 and cr 0,87  BNP 604 High sensitive troponin 9 and 10 Wbc 7.2, hgb 13,0 hvt 39,9 plt 232  Sars covid 19 negative   Chest radiograph with cardiomegaly, bilateral atelectasis and small bilateral pleural  effusions.   EKG 126 bpm, normal axis, normal qtc, atrial fibrillation with q wave V1 and V2 with no significant ST segment or T wave changes.   Patient was placed on furosemide for diuresis.  Placed on AV blockade for rate control.    Assessment and Plan: * Acute on chronic diastolic CHF (congestive heart failure) ([place]) Patient was admitted to the cardiac ward and was placed on aggressive diuresis with furosemide.  Negative fluid balance was achieved, -2.040, with significant improvement in her symptoms.   Further work up with echocardiogram with LV EF 60 to 65% , moderate dilatation of bilateral atriums. Circumferential pericardial effusion. RV systolic function preserved. RSVP 34,4 mmHg. 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   Patient will continue heart failure management with metoprolol succinate. Diuresis with furosemide. Her blood pressure has been low down to 93 mmHg systolic, will hold on spironolactone at this point.    Atrial fibrillation with rapid ventricular response ([place]) Patient has been placed on increased dose of metoprolol succinate 75 mg and added diltiazem for better rate control. Continue anticoagulation with apixaban.   History of pulmonary embolism Continue anticoagulation with apixaban.   Obstructive sleep apnea Continue home Cpap  Essential hypertension Continue blood pressure control with diltiazem and metoprolol. Discontinue amlodipine   GERD without esophagitis Continue antiacid therapy with pantoprazole.   Hyponatremia Hyperkalemia.  Renal function has been stable with discharge cr at 1,13 with K at 4,1 and serum bicarbonate at 28, NA at 135.   Plan to continue diuresis with furosemide and follow up renal function as outpatient.   Bronchiectasis ([place])  No clinical signs of exacerbation, continue with bronchodilator therapy.          Consultants: cardiology  Procedures performed: none    Disposition: Home Diet recommendation:  Cardiac diet DISCHARGE MEDICATION: Allergies as of 12/04/2021       Reactions   Gluten Meal Other (See Comments)   Unknown   Naproxen Other (See Comments)   Stomach upset        Medication List     STOP taking these medications    amLODipine 5 MG tablet Commonly known as: NORVASC   D3 2000 50 MCG (2000 UT) Caps Generic drug: Cholecalciferol       TAKE these medications    ALPRAZolam 0.5 MG tablet Commonly known as: XANAX Take 0.5 mg by mouth as needed.   apixaban 5 MG Tabs tablet Commonly known as: ELIQUIS Take 1 tablet (5 mg total) by mouth 2 (two) times daily.   AYR IN Inhale 1 drop into the lungs daily as needed (rhinitis).   AYR SALINE NASAL GEL NA Place 1 application. into the nose daily as needed (rhinitis).   CHEWABLE CALCIUM/D PO Take by mouth.   [employer] OMEGA-3 GUMMY FISH PO Take by mouth.   diltiazem 120 MG 24 hr capsule Commonly known as: CARDIZEM CD Take 1 capsule (120 mg total) by mouth daily. Start taking on: December 05, 2021   EQ ACETAMINOPHEN PO Take 500 mg by mouth as needed (pain).   EQ SINUS CONGESTION & PAIN PO Take by mouth as needed (for sinus pain). Contains acetaminophen 325 mg and Phenylephrine 5 mg   FLUTICASONE PROPIONATE (NASAL) NA Place into the nose daily as needed (rhinitis).   furosemide 20 MG tablet Commonly known as: LASIX Take 1 tablet (20 mg total) by mouth daily. Start taking on: December 05, 2021   HAIR SKIN & NAILS GUMMIES PO Take 1 tablet by mouth daily.   HM Multivitamin Adult Gummy Chew Chew by mouth.   metoprolol succinate 25 MG 24 hr tablet Commonly known as: TOPROL-XL Take 3 tablets (75 mg total) by mouth daily. Take with or immediately following a meal. Start taking on: December 05, 2021 What changed:  medication strength how much to take how to take this when to take this additional instructions   polyethylene glycol powder 17 GM/SCOOP powder Commonly known  as: GLYCOLAX/MIRALAX Take 1 Container by mouth once.   potassium chloride 10 MEQ tablet Commonly known as: KLOR-CON M Take 1 tablet (10 mEq total) by mouth daily. Start taking on: December 05, 2021   Spiriva Respimat 1.25 MCG/ACT Aers Generic drug: Tiotropium Bromide Monohydrate Inhale 2 puffs into the lungs daily.   traMADol 50 MG tablet Commonly known as: ULTRAM Take 50 mg by mouth 2 (two) times daily as needed.   ZOLPIDEM TARTRATE ER PO Take 2.5 mg by mouth at bedtime.        Follow-up Information     Fenton, Clint R, PA Follow up on 12/11/2021.   Specialty: Cardiology Why: 10 AM Contact information: [place] [id number] [phone]                Discharge Exam: Filed Weights   12/02/21 1609 12/03/21 0445 12/04/21 0500  Weight: 57.2 kg 56.3 kg 51.9 kg   BP 93/68 (BP Location: Left Arm)    Pulse 94    Temp 97.7 F (36.5 C) (Oral)    Resp 20    Ht 5' 1.5" (1.562 m)    Wt 51.9  kg    SpO2 98%    BMI 21.28 kg/m   Patient is feeling well, no dizziness or lightheadedness, no nausea or vomiting, no dyspnea or chest pain.   Neurology awake and alert ENT with no pallor Cardiovascular with S1 and S2 present irregularly irregular with no gallops or rubs, positive systolic murmur at the apex. No JVD No lower extremity edema.  Respiratory with no wheezing or rhonchi Abdomen soft and non tender  Condition at discharge: stable  The results of significant diagnostics from this hospitalization (including imaging, microbiology, ancillary and laboratory) are listed below for reference.   Imaging Studies: DG Chest Port 1 View  Result Date: 12/02/2021 CLINICAL DATA:  Palpitations, dyspnea EXAM: PORTABLE CHEST 1 VIEW COMPARISON:  10/25/2021 chest radiograph. FINDINGS: Low lung volumes. Stable cardiomediastinal silhouette with top-normal heart size. No pneumothorax. Small bilateral pleural effusions. Patchy mild opacity in the right mid lung and at both lung  bases. No overt pulmonary edema. IMPRESSION: 1. Low lung volumes. Patchy mild opacities in the right mid lung and at both lung bases, which could represent aspiration, pneumonia or atelectasis. Chest radiograph follow-up advised. 2. Small bilateral pleural effusions. Electronically Signed   By: Ilona Sorrel M.D.   On: 12/02/2021 16:48   ECHOCARDIOGRAM LIMITED  Result Date: 12/03/2021    ECHOCARDIOGRAM LIMITED REPORT   Patient Name:   [facility] Date of Exam: 12/03/2021 Medical Rec #:  [id number]         Height:       61.5 in Accession #:    [id number]        Weight:       124.1 lb Date of Birth:  10/11/1931         BSA:          1.551 m Patient Age:    19 years          BP:           128/88 mmHg Patient Gender: F                 HR:           79 bpm. Exam Location:  Inpatient Procedure: Limited Echo, Cardiac Doppler, Color Doppler and 3D Echo Indications:    I50.40* Unspecified combined systolic (congestive) and diastolic                 (congestive) heart failure; I26.02 Pulmonary embolus  History:        Patient has prior history of Echocardiogram examinations, most                 recent 10/15/2021. CHF, Abnormal ECG, Mitral Valve Disease and                 Mitral Valve Prolapse, Arrythmias:Atrial Fibrillation, Atrial                 Flutter and PVC, Signs/Symptoms:Chest Pain; Risk                 Factors:Hypertension and Sleep Apnea.  Sonographer:    Roseanna Rainbow RDCS Referring Phys: [id number] [place]  1. Left ventricular ejection fraction, by estimation, is 60 to 65%. Left ventricular ejection fraction by 2D MOD biplane is 60.8 %. The left ventricle has normal function. The left ventricle has no regional wall motion abnormalities. There is mild left ventricular hypertrophy. Left ventricular diastolic function could not be evaluated.  2. Left atrial size was moderately dilated.  3. Right atrial size was moderately dilated.  4. The pericardial effusion is circumferential. There is no  evidence of cardiac tamponade.  5. The mitral valve is myxomatous. Moderate mitral valve regurgitation. There is moderate late systolic prolapse of the middle scallop of the posterior leaflet of the mitral valve.  6. Tricuspid valve regurgitation is moderate.  7. The aortic valve is tricuspid. Aortic valve sclerosis is present, with no evidence of aortic valve stenosis.  8. There is normal pulmonary artery systolic pressure. The estimated right ventricular systolic pressure is 43.1 mmHg.  9. The inferior vena cava is dilated in size with >50% respiratory variability, suggesting right atrial pressure of 8 mmHg. Comparison(s): Changes from prior study are noted. 10/15/2021: LVEF 63%, moderate to severe MR with posterior leaflet prolapse, grade 2 DD. FINDINGS  Left Ventricle: Left ventricular ejection fraction, by estimation, is 60 to 65%. Left ventricular ejection fraction by 2D MOD biplane is 60.8 %. The left ventricle has normal function. The left ventricle has no regional wall motion abnormalities. 3D left ventricular ejection fraction analysis performed but not reported based on interpreter judgement due to suboptimal tracking. The left ventricular internal cavity size was normal in size. There is mild left ventricular hypertrophy. Left ventricular diastolic function could not be evaluated. Left ventricular diastolic function could not be evaluated due to atrial fibrillation. Right Ventricle: There is normal pulmonary artery systolic pressure. The tricuspid regurgitant velocity is 2.57 m/s, and with an assumed right atrial pressure of 8 mmHg, the estimated right ventricular systolic pressure is 54.0 mmHg. Left Atrium: Left atrial size was moderately dilated. Right Atrium: Right atrial size was moderately dilated. Pericardium: Trivial pericardial effusion is present. The pericardial effusion is circumferential. There is no evidence of cardiac tamponade. Mitral Valve: The mitral valve is myxomatous. There is moderate  late systolic prolapse of the middle scallop of the posterior leaflet of the mitral valve. There is moderate thickening of the anterior and posterior mitral valve leaflet(s). Moderate mitral  valve regurgitation, with eccentric anteriorly directed jet. Tricuspid Valve: The tricuspid valve is grossly normal. Tricuspid valve regurgitation is moderate. Aortic Valve: The aortic valve is tricuspid. Aortic valve sclerosis is present, with no evidence of aortic valve stenosis. Pulmonic Valve: The pulmonic valve was grossly normal. Pulmonic valve regurgitation is trivial. Aorta: The aortic root and ascending aorta are structurally normal, with no evidence of dilitation. Venous: The inferior vena cava is dilated in size with greater than 50% respiratory variability, suggesting right atrial pressure of 8 mmHg. Additional Comments: Mild ascites is present. LEFT VENTRICLE PLAX 2D                        Biplane EF (MOD) LVIDd:         3.80 cm         LV Biplane EF:   Left LVIDs:         2.70 cm                          ventricular LV PW:         1.10 cm                          ejection LV IVS:        0.85 cm  fraction by LVOT diam:     1.90 cm                          2D MOD LVOT Area:     2.84 cm                         biplane is                                                 60.8 %.  LV Volumes (MOD) LV vol d, MOD    74.9 ml A2C: LV vol d, MOD    59.0 ml A4C:                           3D Volume EF: LV vol s, MOD    28.6 ml       3D EF:        57 % A2C:                           LV EDV:       59 ml LV vol s, MOD    23.6 ml       LV ESV:       25 ml A4C:                           LV SV:        34 ml LV SV MOD A2C:   46.3 ml LV SV MOD A4C:   59.0 ml LV SV MOD BP:    40.7 ml LEFT ATRIUM           Index        RIGHT ATRIUM           Index LA Vol (A2C): 70.2 ml 45.25 ml/m  RA Area:     20.10 cm                                    RA Volume:   64.80 ml  41.77 ml/m   AORTA Ao Asc diam: 3.10 cm MR Peak  grad:    87.6 mmHg    TRICUSPID VALVE MR Mean grad:    60.0 mmHg    TR Peak grad:   26.4 mmHg MR Vmax:         468.00 cm/s  TR Vmax:        257.00 cm/s MR Vmean:        369.0 cm/s MR PISA:         1.57 cm     SHUNTS MR PISA Eff ROA: 13 mm       Systemic Diam: 1.90 cm MR PISA Radius:  0.50 cm Lyman Bishop MD Electronically signed by Lyman Bishop MD Signature Date/Time: 12/03/2021/4:40:08 PM    Final     Microbiology: Results for orders placed or performed during the hospital encounter of 12/02/21  Resp Panel by RT-PCR (Flu A&B, Covid) Nasopharyngeal Swab     Status: None   Collection Time: 12/03/21  2:46 AM   Specimen: Nasopharyngeal Swab; Nasopharyngeal(NP) swabs in vial transport  medium  Result Value Ref Range Status   SARS Coronavirus 2 by RT PCR NEGATIVE NEGATIVE Final    Comment: (NOTE) SARS-CoV-2 target nucleic acids are NOT DETECTED.  The SARS-CoV-2 RNA is generally detectable in upper respiratory specimens during the acute phase of infection. The lowest concentration of SARS-CoV-2 viral copies this assay can detect is 138 copies/mL. A negative result does not preclude SARS-Cov-2 infection and should not be used as the sole basis for treatment or other patient management decisions. A negative result may occur with  improper specimen collection/handling, submission of specimen other than nasopharyngeal swab, presence of viral mutation(s) within the areas targeted by this assay, and inadequate number of viral copies(<138 copies/mL). A negative result must be combined with clinical observations, patient history, and epidemiological information. The expected result is Negative.  Fact Sheet for Patients:  EntrepreneurPulse.com.au  Fact Sheet for Healthcare Providers:  IncredibleEmployment.be  This test is no t yet approved or cleared by the [place] FDA and  has been authorized for detection and/or diagnosis of SARS-CoV-2 by FDA under an  Emergency Use Authorization (EUA). This EUA will remain  in effect (meaning this test can be used) for the duration of the COVID-19 declaration under Section 564(b)(1) of the Act, 21 U.S.C.section 360bbb-3(b)(1), unless the authorization is terminated  or revoked sooner.       Influenza A by PCR NEGATIVE NEGATIVE Final   Influenza B by PCR NEGATIVE NEGATIVE Final    Comment: (NOTE) The Xpert Xpress SARS-CoV-2/FLU/RSV plus assay is intended as an aid in the diagnosis of influenza from Nasopharyngeal swab specimens and should not be used as a sole basis for treatment. Nasal washings and aspirates are unacceptable for Xpert Xpress SARS-CoV-2/FLU/RSV testing.  Fact Sheet for Patients: EntrepreneurPulse.com.au  Fact Sheet for Healthcare Providers: IncredibleEmployment.be  This test is not yet approved or cleared by the [place] FDA and has been authorized for detection and/or diagnosis of SARS-CoV-2 by FDA under an Emergency Use Authorization (EUA). This EUA will remain in effect (meaning this test can be used) for the duration of the COVID-19 declaration under Section 564(b)(1) of the Act, 21 U.S.C. section 360bbb-3(b)(1), unless the authorization is terminated or revoked.  Performed at [facility], [place] [address], [place], [place] [address]     Labs: CBC: Recent Labs  Lab 12/02/21 1752 12/02/21 1805 12/03/21 0532  WBC 7.2  --  6.2  NEUTROABS 4.1  --   --   HGB 13.0 13.9 12.7  HCT 39.9 41.0 38.1  MCV 96.8  --  92.5  PLT 232  --  384   Basic Metabolic Panel: Recent Labs  Lab 12/02/21 1752 12/02/21 1805 12/03/21 0532 12/04/21 0537  NA 129* 129* 135 135  K 5.2* 5.2* 3.6 4.1  CL 99 99 97* 97*  CO2 18*  --  26 28  GLUCOSE 96 96 95 95  BUN 10 12 10 12   CREATININE 0.87 0.90 0.97 1.13*  CALCIUM 9.3  --  9.5 9.4   Liver Function Tests: Recent Labs  Lab 12/02/21 1752  AST 45*  ALT 17  ALKPHOS 52  BILITOT 1.7*   PROT 6.4*  ALBUMIN 3.8   CBG: No results for input(s): GLUCAP in the last 168 hours.  Discharge time spent: greater than 30 minutes.  Signed: Tawni Millers, MD [facility] 12/04/2021

## 2021-12-04 NOTE — Assessment & Plan Note (Signed)
Hyperkalemia.  ?Renal function has been stable with discharge cr at 1,13 with K at 4,1 and serum bicarbonate at 28, NA at 135.  ? ?Plan to continue diuresis with furosemide and follow up renal function as outpatient.  ?

## 2021-12-04 NOTE — Progress Notes (Signed)
Patient requested to do blood works at [facility]. Informed phlebotomist.  ?

## 2021-12-05 ENCOUNTER — Telehealth: Payer: Self-pay | Admitting: Internal Medicine

## 2021-12-05 NOTE — Telephone Encounter (Signed)
Dr. Chase Caller , her patient assistance was denied. What do you want to do going forward. She will need anticoagulation therapy . Just got admitted with CHF and A Fib .  ?

## 2021-12-06 NOTE — Telephone Encounter (Signed)
Spoke with the pt  ?She wants MR to please review recent hospital d/c summary  ?She says that they changed some of her meds and wants to ensure Dr Chase Caller is okay with all the changes  ?I advised will ask him to review and let her know if there is anything he wants her to do differently  ?Note that the pt does have rov here with MR on 01/16/22 for 30 min visit ?

## 2021-12-06 NOTE — Telephone Encounter (Signed)
Meds are ok. One thing I Wanted to tell her in past was that I do not see a big benefit in fish oil ?

## 2021-12-06 NOTE — Telephone Encounter (Signed)
Spoke with the pt and notified of response per MR ?She verbalized understanding  ?Nothing further needed ?

## 2021-12-11 ENCOUNTER — Ambulatory Visit (HOSPITAL_COMMUNITY): Payer: Medicare Other | Admitting: Physician Assistant

## 2021-12-12 ENCOUNTER — Ambulatory Visit (HOSPITAL_COMMUNITY)
Admission: RE | Admit: 2021-12-12 | Discharge: 2021-12-12 | Disposition: A | Discharge status: Home / Self Care | Payer: Medicare Other | Source: Ambulatory Visit | Attending: Physician Assistant | Admitting: Physician Assistant

## 2021-12-12 ENCOUNTER — Other Ambulatory Visit: Payer: Self-pay

## 2021-12-12 ENCOUNTER — Encounter (HOSPITAL_COMMUNITY): Payer: Self-pay | Admitting: Physician Assistant

## 2021-12-12 VITALS — BP 122/82 | HR 100 | Ht 61.5 in | Wt 115.6 lb

## 2021-12-12 DIAGNOSIS — Z9989 Dependence on other enabling machines and devices: Secondary | ICD-10-CM | POA: Insufficient documentation

## 2021-12-12 DIAGNOSIS — I341 Nonrheumatic mitral (valve) prolapse: Secondary | ICD-10-CM | POA: Insufficient documentation

## 2021-12-12 DIAGNOSIS — I484 Atypical atrial flutter: Secondary | ICD-10-CM | POA: Diagnosis not present

## 2021-12-12 DIAGNOSIS — I110 Hypertensive heart disease with heart failure: Secondary | ICD-10-CM | POA: Diagnosis not present

## 2021-12-12 DIAGNOSIS — D6869 Other thrombophilia: Secondary | ICD-10-CM | POA: Diagnosis not present

## 2021-12-12 DIAGNOSIS — Z7901 Long term (current) use of anticoagulants: Secondary | ICD-10-CM | POA: Diagnosis not present

## 2021-12-12 DIAGNOSIS — I4892 Unspecified atrial flutter: Secondary | ICD-10-CM | POA: Diagnosis not present

## 2021-12-12 DIAGNOSIS — I5032 Chronic diastolic (congestive) heart failure: Secondary | ICD-10-CM | POA: Insufficient documentation

## 2021-12-12 DIAGNOSIS — G4733 Obstructive sleep apnea (adult) (pediatric): Secondary | ICD-10-CM | POA: Insufficient documentation

## 2021-12-12 DIAGNOSIS — Z79899 Other long term (current) drug therapy: Secondary | ICD-10-CM | POA: Diagnosis not present

## 2021-12-12 DIAGNOSIS — I4819 Other persistent atrial fibrillation: Secondary | ICD-10-CM | POA: Insufficient documentation

## 2021-12-12 NOTE — Progress Notes (Signed)
? ? ?Primary Care Physician: Lawerance Cruel, MD ?Primary Cardiologist: Dr [name] ?Primary Electrophysiologist: none ?Referring Physician: Dr Johney Frame  ? ? ?Janet Nguyen is a 88 y.o. female with a history of SVT, OSA, celiac disease, HFpEF, PE, atrial flutter, atrial fibrillation who presents for consultation in the [facility].  The patient was initially diagnosed with atrial flutter remotely. She presented to the ED 12/02/21 from her PCP office with symptoms of generalized weakness and rapid heart rates. ECG showed afib with RVR. She was started on diltiazem and metoprolol for rate control. Also started on Eliquis for a CHADS2VASC score of 6. Patient reports that she feels well today, does have some anxiety. She is able to complete her ADLs. Her primary concerns are regarding her scoliosis and back pain.  ? ?Today, she denies symptoms of palpitations, chest pain, shortness of breath, orthopnea, PND, lower extremity edema, dizziness, presyncope, syncope, snoring, daytime somnolence, bleeding, or neurologic sequela. The patient is tolerating medications without difficulties and is otherwise without complaint today.  ? ? ?Atrial Fibrillation Risk Factors: ? ?she does have symptoms or diagnosis of sleep apnea. ?she is not compliant with CPAP therapy. ?she does not have a history of rheumatic fever. ? ? ?she has a BMI of Body mass index is 21.49 kg/m?Marland KitchenMarland Kitchen ?Filed Weights  ? 12/12/21 1453  ?Weight: 52.4 kg  ? ? ?Family History  ?Problem Relation Age of Onset  ? Breast cancer Mother   ? Heart disease Father   ? Migraines Neg Hx   ? ? ? ?Atrial Fibrillation Management history: ? ?Previous antiarrhythmic drugs: none ?Previous cardioversions: none ?Previous ablations: none ?CHADS2VASC score: 6 ?Anticoagulation history: Eliquis ? ? ?Past Medical History:  ?Diagnosis Date  ? Arrhythmia   ? History of SVT with documented PVC'S and  PAC'S  12/08/12 Nuc stress test normal LV EF 74%  Event Monitor   12/01/12-01/03/13  ? Atrial flutter ([place])   ? Celiac disease   ? treated by Dr. [name]  ? GERD (gastroesophageal reflux disease)   ? Intervertebral disc stenosis of neural canal of cervical region   ? Irregular heart beat 11/30/12  ? ECHO-EF 60-65%  ? Osteoporosis   ? PMR (polymyalgia rheumatica) (HCC)   ? Dr. Marijean Bravo; pt states she was diagnosed 10-15 years ago, not treated at this time or any issues that she is aware of.  ? Scoliosis   ? Scoliosis   ? Sleep apnea 10/02/11 [facility]  ? Sleep study AHI -total sleep 10.3/hr  64.0/ hr during REM sleep.RDI 22.8/hr during total sleep 64.0/hr during REM sleep The lowest O2 sat during Non-REM and REM sleep was 86% and 88% respectively. 04/08/12 CPAP/BIPAP titration study [facility]  ? ?Past Surgical History:  ?Procedure Laterality Date  ? APPENDECTOMY    ? ruptured at age 39 and had surgery  ? CARDIAC CATHETERIZATION  01/27/06  ? cataract surgery  2015  ? Dr. Herbert Deaner; March & April 2015  ? ? ?Current Outpatient Medications  ?Medication Sig Dispense Refill  ? Aloe-Sodium Chloride (AYR SALINE NASAL GEL NA) Place 1 application. into the nose daily as needed (rhinitis).    ? ALPRAZolam (XANAX) 0.5 MG tablet Take 0.5 mg by mouth as needed.    ? apixaban (ELIQUIS) 5 MG TABS tablet Take 1 tablet (5 mg total) by mouth 2 (two) times daily. 60 tablet 5  ? Aromatic Inhalants (AYR IN) Inhale 1 drop into the lungs daily as needed (  rhinitis).    ? Biotin w/ Vitamins C & E (HAIR SKIN & NAILS GUMMIES PO) Take 1 tablet by mouth daily.    ? Calcium Carb-Ergocalciferol (CHEWABLE CALCIUM/D PO) Take by mouth.    ? diltiazem (CARDIZEM CD) 120 MG 24 hr capsule Take 1 capsule (120 mg total) by mouth daily. 30 capsule 0  ? EQ ACETAMINOPHEN PO Take 500 mg by mouth as needed (pain).    ? fluticasone (FLONASE) 50 MCG/ACT nasal spray Place into both nostrils.    ? furosemide (LASIX) 20 MG tablet Take 1 tablet (20 mg total) by mouth daily. 30 tablet 0  ? metoprolol  succinate (TOPROL-XL) 25 MG 24 hr tablet Take 3 tablets (75 mg total) by mouth daily. Take with or immediately following a meal. 90 tablet 0  ? Multiple Vitamins-Minerals (HM MULTIVITAMIN ADULT GUMMY) CHEW Chew by mouth.    ? Omega-3 Fatty Acids ([employer] OMEGA-3 GUMMY FISH PO) Take by mouth.    ? Phenylephrine-APAP-guaiFENesin (EQ SINUS CONGESTION & PAIN PO) Take by mouth as needed (for sinus pain). Contains acetaminophen 325 mg and Phenylephrine 5 mg    ? polyethylene glycol powder (GLYCOLAX/MIRALAX) 17 GM/SCOOP powder Take 1 Container by mouth once.    ? potassium chloride (KLOR-CON M) 10 MEQ tablet Take 1 tablet (10 mEq total) by mouth daily. 30 tablet 0  ? Tiotropium Bromide Monohydrate (SPIRIVA RESPIMAT) 1.25 MCG/ACT AERS Inhale 2 puffs into the lungs daily. 4 g 0  ? traMADol (ULTRAM) 50 MG tablet Take 50 mg by mouth 2 (two) times daily as needed.    ? ZOLPIDEM TARTRATE ER PO Take 2.5 mg by mouth at bedtime.    ? ?No current facility-administered medications for this encounter.  ? ? ?Allergies  ?Allergen Reactions  ? Gluten Meal Other (See Comments)  ?  Unknown  ? Naproxen Other (See Comments)  ?  Stomach upset  ? ? ?Social History  ? ?Socioeconomic History  ? Marital status: Divorced  ?  Spouse name: Not on file  ? Number of children: 4  ? Years of education: college   ? Highest education level: Not on file  ?Occupational History  ? Occupation: retired   ?Tobacco Use  ? Smoking status: Never  ? Smokeless tobacco: Never  ? Tobacco comments:  ?  Never smoke 12/12/21  ?Vaping Use  ? Vaping Use: Never used  ?Substance and Sexual Activity  ? Alcohol use: Yes  ?  Alcohol/week: 3.0 - 4.0 standard drinks  ?  Types: 3 - 4 Glasses of wine per week  ?  Comment: 1 glass of wine 3-4 times weekly 12/12/21  ? Drug use: No  ? Sexual activity: Not on file  ?Other Topics Concern  ? Not on file  ?Social History Narrative  ? Lives alone at home  ? Drinks tea but tries to drink decaf  ? Has 9 grandchildren   ? Right handed  ? ?Social  Determinants of Health  ? ?Financial Resource Strain: Low Risk   ? Difficulty of Paying Living Expenses: Not hard at all  ?Food Insecurity: No Food Insecurity  ? Worried About Charity fundraiser in the Last Year: Never true  ? Ran Out of Food in the Last Year: Never true  ?Transportation Needs: No Transportation Needs  ? Lack of Transportation (Medical): No  ? Lack of Transportation (Non-Medical): No  ?Physical Activity: Not on file  ?Stress: Not on file  ?Social Connections: Not on file  ?Intimate Partner Violence: Not  on file  ? ? ? ?ROS- All systems are reviewed and negative except as per the HPI above. ? ?Physical Exam: ?Vitals:  ? 12/12/21 1453  ?BP: 122/82  ?Pulse: 100  ?Weight: 52.4 kg  ?Height: 5' 1.5" (1.562 m)  ? ? ?GEN- The patient is a well appearing elderly female, alert and oriented x 3 today.   ?Head- normocephalic, atraumatic ?Eyes-  Sclera clear, conjunctiva pink ?Ears- hearing intact ?Oropharynx- clear ?Neck- supple  ?Lungs- Clear to ausculation bilaterally, normal work of breathing ?Heart- irregular rate and rhythm, no rubs or gallops, 2/6 systolic murmur   ?GI- soft, NT, ND, + BS ?Extremities- no clubbing, cyanosis, or edema ?MS- no significant deformity or atrophy ?Skin- no rash or lesion ?Psych- euthymic mood, full affect ?Neuro- strength and sensation are intact ? ?Wt Readings from Last 3 Encounters:  ?12/12/21 52.4 kg  ?12/04/21 51.9 kg  ?11/28/21 57.8 kg  ? ? ?EKG today demonstrates  ?Atypical atrial flutter with variable block ?Vent. rate 100 BPM ?PR interval * ms ?QRS duration 88 ms ?QT/QTcB 338/436 ms ? ?Echo 12/03/21 demonstrated  ? 1. Left ventricular ejection fraction, by estimation, is 60 to 65%. Left  ?ventricular ejection fraction by 2D MOD biplane is 60.8 %. The left  ?ventricle has normal function. The left ventricle has no regional wall  ?motion abnormalities. There is mild left  ?ventricular hypertrophy. Left ventricular diastolic function could not be  ?evaluated.  ? 2. Left  atrial size was moderately dilated.  ? 3. Right atrial size was moderately dilated.  ? 4. The pericardial effusion is circumferential. There is no evidence of  ?cardiac tamponade.  ? 5. The mitral valve is

## 2021-12-13 NOTE — Telephone Encounter (Signed)
Patient was seen by Dr. Chase Caller on 11/28/21.  Please see note.  Closing encounter. ?

## 2021-12-18 ENCOUNTER — Encounter: Payer: Self-pay | Admitting: Cardiovascular Disease

## 2021-12-18 ENCOUNTER — Telehealth: Payer: Self-pay | Admitting: Internal Medicine

## 2021-12-18 ENCOUNTER — Other Ambulatory Visit: Payer: Self-pay

## 2021-12-18 ENCOUNTER — Ambulatory Visit (INDEPENDENT_AMBULATORY_CARE_PROVIDER_SITE_OTHER): Payer: Medicare Other | Admitting: Cardiovascular Disease

## 2021-12-18 VITALS — BP 114/80 | HR 94 | Ht 61.5 in | Wt 116.4 lb

## 2021-12-18 DIAGNOSIS — I10 Essential (primary) hypertension: Secondary | ICD-10-CM | POA: Diagnosis not present

## 2021-12-18 DIAGNOSIS — I872 Venous insufficiency (chronic) (peripheral): Secondary | ICD-10-CM

## 2021-12-18 DIAGNOSIS — I4819 Other persistent atrial fibrillation: Secondary | ICD-10-CM | POA: Diagnosis not present

## 2021-12-18 DIAGNOSIS — J479 Bronchiectasis, uncomplicated: Secondary | ICD-10-CM | POA: Diagnosis not present

## 2021-12-18 DIAGNOSIS — M4125 Other idiopathic scoliosis, thoracolumbar region: Secondary | ICD-10-CM | POA: Diagnosis not present

## 2021-12-18 DIAGNOSIS — R011 Cardiac murmur, unspecified: Secondary | ICD-10-CM | POA: Diagnosis not present

## 2021-12-18 DIAGNOSIS — I484 Atypical atrial flutter: Secondary | ICD-10-CM

## 2021-12-18 DIAGNOSIS — G4733 Obstructive sleep apnea (adult) (pediatric): Secondary | ICD-10-CM

## 2021-12-18 DIAGNOSIS — R918 Other nonspecific abnormal finding of lung field: Secondary | ICD-10-CM

## 2021-12-18 DIAGNOSIS — Z7901 Long term (current) use of anticoagulants: Secondary | ICD-10-CM

## 2021-12-18 DIAGNOSIS — I2699 Other pulmonary embolism without acute cor pulmonale: Secondary | ICD-10-CM | POA: Diagnosis not present

## 2021-12-18 MED ORDER — METOPROLOL SUCCINATE ER 50 MG PO TB24: 50.0000 mg | ORAL_TABLET | Freq: Two times a day (BID) | ORAL | 3 refills | Status: DC

## 2021-12-18 NOTE — Patient Instructions (Addendum)
Medication Instructions:  ? ?-Take metoprolol succinate (topril-xl) 56m twice daily. ? ?*If you need a refill on your cardiac medications before your next appointment, please call your pharmacy* ? ? ?Lab Work: ?Your physician recommends that you return for lab work in: within 7 days of your cardioversion: BMET, CBC, Mag ? ?If you have labs (blood work) drawn today and your tests are completely normal, you will receive your results only by: ?MyChart Message (if you have MyChart) OR ?A paper copy in the mail ?If you have any lab test that is abnormal or we need to change your treatment, we will call you to review the results. ? ? ?Testing/Procedures: ?See below ? ? ?Follow-Up: ?[facility] our [facility].  As part of our continuing mission to provide you with exceptional heart care, we have created designated Provider Care Teams.  These Care Teams include your primary Cardiologist (physician) and Advanced Practice Providers (APPs -  Physician Assistants and Nurse Practitioners) who all work together to provide you with the care you need, when you need it. ? ?We recommend signing up for the patient portal called "MyChart".  Sign up information is provided on this After Visit Summary.  MyChart is used to connect with patients for Virtual Visits (Telemedicine).  Patients are able to view lab/test results, encounter notes, upcoming appointments, etc.  Non-urgent messages can be sent to your provider as well.   ?To learn more about what you can do with MyChart, go to [email or web address]   ? ?Your next appointment ?Wednesday, May 3rd at 2:40pm ? ?The format for your next appointment:   ?In Person ? ?Provider:   ?TShelva Majestic MD   ? ? ?Other Instructions ? ?You are scheduled for a Cardioversion on Wednesday, April 12th with Dr. PJohney Frame  [facility] [address] [id number] 8 am. (1 hour prior to  procedure unless lab work is needed; if lab work is needed arrive 1.5 hours ahead) ? ?DIET: Nothing to eat or drink after midnight except a sip of water with medications (see medication instructions below) ? ?FYI: For your safety, and to allow uKoreato monitor your vital signs accurately during the surgery/procedure we request that   ?if you have artificial nails, gel coating, SNS etc. Please have those removed prior to your surgery/procedure. Not having the nail coverings /polish removed may result in cancellation or delay of your surgery/procedure. ? ? ?Medication Instructions: ?Hold: Lasix and Potassium on day of procedure. ? ?Continue your anticoagulant: Eliquis  ?You will need to continue your anticoagulant after your procedure until you  are told by your  ?Provider that it is safe to stop ? ? ?Labs: to do within 7 days of procedure: BMET, CBC and Mag ? ? ?You must have a responsible person to drive you home and stay in the waiting area during your procedure. Failure to do so could result in cancellation. ? ?BInterior and spatial designercards. ? ?*Special Note: Every effort is made to have your procedure done on time. Occasionally there are emergencies that occur at the hospital that may cause delays. Please be patient if a delay does occur.   ? ?

## 2021-12-18 NOTE — H&P (View-Only) (Signed)
50,000 patient ID: Janet Nguyen, female   DOB: 1931-12-18, 88 y.o.   MRN: [id number] ? ? ? ? ?Primary  M.D.: Dr. Mardene Sayer ? ?HPI: Janet Nguyen is a 88 y.o. female who presents for a  followup cardiology evaluation following her recent hospitalization. ? ?Janet Nguyen has a history of documented SVT and also has PACs and PVCs  treated with beta blocker therapy. In April 2014 I further titrated her beta blocker therapy after cardiac event monitor revealed several bursts of recurrent SVT up to 177 beats per minute in March.  In July after she had had 2 episodes of chest fluttering which each lasted over 30 minutes which he did take metoprolol tartrate with relief I recommended further titration of her Toprol to 75 mg in the morning and 50 mg at night and if necessary she could further titrate this to 75 twice a day. ? ?She has a history of obstructive sleep apnea but despite multiple attempts at CPAP utilization she has not been able to tolerate this. She was referred to Dr. [name] and has a customized dental appliance with mandibular advancement with improvement in some of her symptomatology. Due concern that her teeth may be moving in more recently she has has not been using customized appliance daily but has been using her old non-customized mouthguard.  At times shen has awakened abruptly from a dream with her heart pounding and a sensation of hot flashes, gasping for breath.  ? ?She also states that her scoliosis is getting worse. She does have left-sided musculoskeletal type chest pain due to her spine angulation.  Beause of her significant scoliosis, she feels she must sleep on her back.  ? ?She has a history of GERD for which she has been taking Nexium.  She presents for evaluation. ? ?I had scheduled her for an overnight oximetry to see if she is a candidate for supplemental oxygen at nighttime since she refused to use CPAP and only very rarely uses her customized mouthpiece.  She does wear  a mouthguard to reduce bruxism.  Oximetry study was performed overnight on February 23/24.  Her mean oxygen saturation was 93.12%.  She spent 12 minutes and 16 seconds with O2 sat duration below 88% with the lowest O2 sat duration of 80%. I tried to set her up for supplemental oxygen at bedtime.  However, she has been denied for this on multiple occasions by her insurance/Medicare. ? ?She feels that she is sleeping better.  She can only sleep in her back due to scoliosis.  Her left side is concave; her right side is convex.  She has had issues with labile blood pressure. She has had issues with lower back discomfort and sciatica leading to emergency room evaluation in November 2016.  She saw Dr. [name] for neurosurgical evaluation. She has migraine headaches. She  Recently saw Dr. [name] for these migraine headaches.  She has been on Toprol-XL 75 mg twice a day for her palpitations and presently denies any awareness an extra heartbeats. She takes Xanax on an as-needed basis for anxiety.  She has been taking Nexium 20, no grams every other day for GERD. She states her scoliosis is getting worse.  Has been experiencing more fatigue.  She also notes some occasional left hand numbness.  In a mouth guard but no ureteral longer uses her mandibular advancement device and not tolerate CPAP therapy for her obstructive sleep apnea.   ? ?She had experienced left-sided chest discomfort which  most likely was related to her significant scoliosis the potential neuropathy causing intermittent left arm and hand numbness.  I slightly reduced her Toprol-XL which she had been taking 150 mg daily and a 75 twice a day regimen to 75 and milligrams in the morning and 50 mg with ultimate plan to decrease this to 50 twice a day.  She states when she reduce this, she began to notice more optical migraines and resume taking the higher dose Toprol at 75 twice a day with improvement.  She has seen Dr. [name] for her paresthesias.  She admits to  significant anxiety.  She had requested zolpidem for sleep initiation and maintenance.  In the past, we had tried 6.25 slow-release version to help with sleep maintenance, but due to cost issues preferred the 5 mg sleep initiation dose. ? ?She experiences optical migraine headaches.  She was being seen by neurologist, Dr. Melton Alar  She continues to have issues with her scoliosis causing discomfort in her neck and arm due to her distortion.  He tells me she underwent endoscopy and colonoscopy as well as banding of hemorrhoids.  She had a benign polyp removed.  She has issues with spastic colon.  Her sleep is better with zolpiden 5 mg. ? ?She saw Kerin Ransom on 11/13/2016.  She had noticed that her hair was "thinning" and was concerned about this being the result of Toprol.  He suggested possibly decreasing Toprol but she preferred not until she had seen me.  Since that time, she continues to experience some optical migraines.  She has noticed some leg left neck discomfort.  She denies any patches of hair loss.  She denies differential arm weakness.  She continues to have difficulty with her scoliosis with hip pain and rib cage discomfort.  ? ?When I saw her in March 2018, there was a blood pressure differential of 118/78 in the left arm and 140/80 in the right arm.  She subsequently underwent upper extremity Doppler evaluation on 12/25/2016.  She was noted have mild heterogeneous plaque with stable 1-39% bilateral internal carotid stenoses.  She had normal subclavian arteries bilaterally.  There are patent vertebral arteries with antegrade flow.  She did not have any significant blood pressure differential and had triphasic waveforms in both her right and biphasic in her left brachial artery.  Left blood pressure was 8 mm higher than the right brachial pressure. ? ?At times, she continues to experience some vague chest wall symptoms, which most likely related to her posture.  She denies significant shortness of breath.   She has to sleep on her back because of her scoliosis.  Uses a chin strap to prevent oral breathing.  She is not on CPAP.  ? ?In June 2019 she had concerns about some of her medications possibly causing hair loss or memory loss.  She admits to having dry eyes.  She continues to have difficulty from her scolWsaw her in November 2019 at which time she,  felt well from a cardiac standphk and apparently had a new oral appliance made.  She used this initially but then started to notice some teeth discomfort.  Apparently this again was treated by Dr.e still admits to being tired.  She has issues with her abdomen being tight which she believes is related to her scoliosis.  Her GERD is controlled with pantoprazole.   ? ?She was seen by Dr. Mardene Sayer for her primary care.  She had developed a fungal infection of her toenail.  He had given her to [place] fine 20 mg.  She has not started this and was hesitant to do this due to potential interaction with metoprolol.  She also was evaluated by Dr. Brett Fairy.  According to Ms. Kimball she did not evaluate her ophthalmic migraines.  However she was planning to do a possible home study to reassess her sleep apnea.  In the past she did not tolerate any CPAP therapy and although initially tolerated customized oral appliance this seemed to cause difficulty with movement of her teeth.  She has been using the CALM app on her smart phone to help with relaxation and improve sleep and is chronically dependent on low-dose zolpidem prescribed by Dr. Harrington Challenger.  She denies chest pain.  Her palpitations are well controlled with her current dose of metoprolol 75 mils twice a day and she can to be on low-dose amlodipine 2.5 mg.   ? ?Since I saw her in June 2020, she underwent the home study by Dr. Theodoro Clock was done after several studies with no data.  According to Dr. Edwena Felty note, AHI was 26.5 which was moderate and during REM sleep AHI rose to 34.9.  Snoring was not recorded.  Apparently  there was discussion of possible Financial planner.  Ms. Odriscoll is not interested. ? ?I saw her in October 2020 at which time she had continued issues with migraines and some vertigo for which he ta

## 2021-12-18 NOTE — Progress Notes (Signed)
50,000 patient ID: Janet Nguyen, female   DOB: 1932-02-17, 88 y.o.   MRN: [id number] ? ? ? ? ?Primary  M.D.: Dr. Mardene Sayer ? ?HPI: Janet Nguyen is a 88 y.o. female who presents for a  followup cardiology evaluation following her recent hospitalization. ? ?Ms. Swinford has a history of documented SVT and also has PACs and PVCs  treated with beta blocker therapy. In April 2014 I further titrated her beta blocker therapy after cardiac event monitor revealed several bursts of recurrent SVT up to 177 beats per minute in March.  In July after she had had 2 episodes of chest fluttering which each lasted over 30 minutes which he did take metoprolol tartrate with relief I recommended further titration of her Toprol to 75 mg in the morning and 50 mg at night and if necessary she could further titrate this to 75 twice a day. ? ?She has a history of obstructive sleep apnea but despite multiple attempts at CPAP utilization she has not been able to tolerate this. She was referred to Dr. [name] and has a customized dental appliance with mandibular advancement with improvement in some of her symptomatology. Due concern that her teeth may be moving in more recently she has has not been using customized appliance daily but has been using her old non-customized mouthguard.  At times shen has awakened abruptly from a dream with her heart pounding and a sensation of hot flashes, gasping for breath.  ? ?She also states that her scoliosis is getting worse. She does have left-sided musculoskeletal type chest pain due to her spine angulation.  Beause of her significant scoliosis, she feels she must sleep on her back.  ? ?She has a history of GERD for which she has been taking Nexium.  She presents for evaluation. ? ?I had scheduled her for an overnight oximetry to see if she is a candidate for supplemental oxygen at nighttime since she refused to use CPAP and only very rarely uses her customized mouthpiece.  She does wear  a mouthguard to reduce bruxism.  Oximetry study was performed overnight on February 23/24.  Her mean oxygen saturation was 93.12%.  She spent 12 minutes and 16 seconds with O2 sat duration below 88% with the lowest O2 sat duration of 80%. I tried to set her up for supplemental oxygen at bedtime.  However, she has been denied for this on multiple occasions by her insurance/Medicare. ? ?She feels that she is sleeping better.  She can only sleep in her back due to scoliosis.  Her left side is concave; her right side is convex.  She has had issues with labile blood pressure. She has had issues with lower back discomfort and sciatica leading to emergency room evaluation in November 2016.  She saw Dr. [name] for neurosurgical evaluation. She has migraine headaches. She  Recently saw Dr. [name] for these migraine headaches.  She has been on Toprol-XL 75 mg twice a day for her palpitations and presently denies any awareness an extra heartbeats. She takes Xanax on an as-needed basis for anxiety.  She has been taking Nexium 20, no grams every other day for GERD. She states her scoliosis is getting worse.  Has been experiencing more fatigue.  She also notes some occasional left hand numbness.  In a mouth guard but no ureteral longer uses her mandibular advancement device and not tolerate CPAP therapy for her obstructive sleep apnea.   ? ?She had experienced left-sided chest discomfort which  most likely was related to her significant scoliosis the potential neuropathy causing intermittent left arm and hand numbness.  I slightly reduced her Toprol-XL which she had been taking 150 mg daily and a 75 twice a day regimen to 75 and milligrams in the morning and 50 mg with ultimate plan to decrease this to 50 twice a day.  She states when she reduce this, she began to notice more optical migraines and resume taking the higher dose Toprol at 75 twice a day with improvement.  She has seen Dr. [name] for her paresthesias.  She admits to  significant anxiety.  She had requested zolpidem for sleep initiation and maintenance.  In the past, we had tried 6.25 slow-release version to help with sleep maintenance, but due to cost issues preferred the 5 mg sleep initiation dose. ? ?She experiences optical migraine headaches.  She was being seen by neurologist, Dr. Melton Alar  She continues to have issues with her scoliosis causing discomfort in her neck and arm due to her distortion.  He tells me she underwent endoscopy and colonoscopy as well as banding of hemorrhoids.  She had a benign polyp removed.  She has issues with spastic colon.  Her sleep is better with zolpiden 5 mg. ? ?She saw Kerin Ransom on 11/13/2016.  She had noticed that her hair was "thinning" and was concerned about this being the result of Toprol.  He suggested possibly decreasing Toprol but she preferred not until she had seen me.  Since that time, she continues to experience some optical migraines.  She has noticed some leg left neck discomfort.  She denies any patches of hair loss.  She denies differential arm weakness.  She continues to have difficulty with her scoliosis with hip pain and rib cage discomfort.  ? ?When I saw her in March 2018, there was a blood pressure differential of 118/78 in the left arm and 140/80 in the right arm.  She subsequently underwent upper extremity Doppler evaluation on 12/25/2016.  She was noted have mild heterogeneous plaque with stable 1-39% bilateral internal carotid stenoses.  She had normal subclavian arteries bilaterally.  There are patent vertebral arteries with antegrade flow.  She did not have any significant blood pressure differential and had triphasic waveforms in both her right and biphasic in her left brachial artery.  Left blood pressure was 8 mm higher than the right brachial pressure. ? ?At times, she continues to experience some vague chest wall symptoms, which most likely related to her posture.  She denies significant shortness of breath.   She has to sleep on her back because of her scoliosis.  Uses a chin strap to prevent oral breathing.  She is not on CPAP.  ? ?In June 2019 she had concerns about some of her medications possibly causing hair loss or memory loss.  She admits to having dry eyes.  She continues to have difficulty from her scolWsaw her in November 2019 at which time she,  felt well from a cardiac standphk and apparently had a new oral appliance made.  She used this initially but then started to notice some teeth discomfort.  Apparently this again was treated by Dr.e still admits to being tired.  She has issues with her abdomen being tight which she believes is related to her scoliosis.  Her GERD is controlled with pantoprazole.   ? ?She was seen by Dr. Mardene Sayer for her primary care.  She had developed a fungal infection of her toenail.  He had given her to [place] fine 20 mg.  She has not started this and was hesitant to do this due to potential interaction with metoprolol.  She also was evaluated by Dr. Brett Fairy.  According to Ms. Fruth she did not evaluate her ophthalmic migraines.  However she was planning to do a possible home study to reassess her sleep apnea.  In the past she did not tolerate any CPAP therapy and although initially tolerated customized oral appliance this seemed to cause difficulty with movement of her teeth.  She has been using the CALM app on her smart phone to help with relaxation and improve sleep and is chronically dependent on low-dose zolpidem prescribed by Dr. Harrington Challenger.  She denies chest pain.  Her palpitations are well controlled with her current dose of metoprolol 75 mils twice a day and she can to be on low-dose amlodipine 2.5 mg.   ? ?Since I saw her in June 2020, she underwent the home study by Dr. Theodoro Clock was done after several studies with no data.  According to Dr. Edwena Felty note, AHI was 26.5 which was moderate and during REM sleep AHI rose to 34.9.  Snoring was not recorded.  Apparently  there was discussion of possible Financial planner.  Ms. Boxer is not interested. ? ?I saw her in October 2020 at which time she had continued issues with migraines and some vertigo for which he ta

## 2021-12-19 ENCOUNTER — Ambulatory Visit: Payer: Medicare Other | Admitting: Internal Medicine

## 2021-12-19 ENCOUNTER — Telehealth: Payer: Self-pay | Admitting: Cardiovascular Disease

## 2021-12-19 NOTE — Telephone Encounter (Signed)
Spoke with pt regarding cardioversion and lab work that she needs to do prior to this procedure. Advised pt on [facility] locations that she can use to have blood work done prior to cardioversion. Pt was also advised regarding procedure selected, explained to pt that Dr. [name] chose this procedure with all of her medical history in mind and would not suggest anything that he thought would cause harm. All questions answered to the best of my ability. Pt verbalizes understanding. ? ?Also, let pt know that time of cardioversion had been changed per her request. Cardioversion is now scheduled for April 12th at [place] pt that she would need to arrive to the hospital at 10am for her procedure. Pt verbalizes understanding. ?

## 2021-12-19 NOTE — Telephone Encounter (Signed)
Patient is calling wanting to discuss some questions about her upcoming cardioversion. She is wanting to know why Dr. [name] chose this to be the best procedure for her due to finding research online it is not suggested for the elderly. She also has questions regarding the labs prior as far as where she can get them and when she should get them date wise.  ?

## 2021-12-19 NOTE — Telephone Encounter (Signed)
Left message for patient to callback . Patient was seen yesterday by Dr [name] in the office 3/222/23. ?

## 2021-12-19 NOTE — Telephone Encounter (Signed)
Attempted to call pt back. Phone continued to ring. Will try again later. ?

## 2021-12-19 NOTE — Telephone Encounter (Signed)
Called patient but she did not answer. Left message for her to call back.  

## 2021-12-20 ENCOUNTER — Other Ambulatory Visit: Payer: Self-pay

## 2021-12-20 DIAGNOSIS — I484 Atypical atrial flutter: Secondary | ICD-10-CM

## 2021-12-23 ENCOUNTER — Telehealth: Payer: Self-pay | Admitting: Internal Medicine

## 2021-12-23 NOTE — Telephone Encounter (Signed)
Patient is calling in regards to her ELIQUIS. She's being told that she would have to pay $300 for her next supply. She stated she can't afford that. She'll be out of the medication the end of the week. She also stated she's almost out of all her medications. Please advise ?

## 2021-12-23 NOTE — Telephone Encounter (Signed)
Lm for patient.  

## 2021-12-24 ENCOUNTER — Encounter: Payer: Self-pay | Admitting: Cardiovascular Disease

## 2021-12-24 ENCOUNTER — Other Ambulatory Visit (HOSPITAL_COMMUNITY): Payer: Self-pay

## 2021-12-24 ENCOUNTER — Telehealth: Payer: Self-pay | Admitting: Cardiovascular Disease

## 2021-12-24 MED ORDER — DILTIAZEM HCL ER COATED BEADS 120 MG PO CP24: 120.0000 mg | ORAL_CAPSULE | Freq: Every day | ORAL | 3 refills | Status: DC

## 2021-12-24 MED ORDER — POTASSIUM CHLORIDE CRYS ER 10 MEQ PO TBCR: 10.0000 meq | EXTENDED_RELEASE_TABLET | Freq: Every day | ORAL | 3 refills | Status: DC

## 2021-12-24 MED ORDER — FUROSEMIDE 20 MG PO TABS
20.0000 mg | ORAL_TABLET | Freq: Every day | ORAL | 3 refills | Status: DC
Start: 2021-12-24 — End: 2022-04-30

## 2021-12-24 NOTE — Telephone Encounter (Signed)
Spoke to patient. ?She stated that she is scheduled for cardioversion 01/08/2022. She is currently taking Eliquis 27m. She can not afford this medication, as her co pay is over 300 dollars. She has one week left of Eliquis.  ? ?Dr. RChase Caller please advise. Can you provide pharmacy team with list of alternatives to do test claims?  ?

## 2021-12-24 NOTE — Telephone Encounter (Signed)
Patient is calling back states she never received a call regarding Eliquis. Patient also wants to make sure Dr. Chase Caller is aware that she is having a cardioversion on 01/08/2022 to make sure she is getting the correct treatment plan. ? ?Please call back at [phone]. ? ? ?

## 2021-12-24 NOTE — Telephone Encounter (Signed)
? ? ?  Pt c/o medication issue: ? ?1. Name of Medication:  ?potassium chloride (KLOR-CON M) 10 MEQ tablet ?furosemide (LASIX) 20 MG tablet ?diltiazem (CARDIZEM CD) 120 MG 24 hr capsule ? ?2. How are you currently taking this medication (dosage and times per day)?  ? ?3. Are you having a reaction (difficulty breathing--STAT)? no ? ?4. What is your medication issue? Patient was put on these medications when she was in the hospital. She is not sure if she still needs to keep taking these medications or not.  ? ?If the patient is home please call her on her cell phone  ?

## 2021-12-24 NOTE — Telephone Encounter (Signed)
Patient is aware that pharmacy team is investigating this matter.  ? ?

## 2021-12-24 NOTE — Telephone Encounter (Signed)
MArgie: this needs to go to pharmacy team.  Last visit patient was told that she would have a one-time high co-pay.  She agreed to do that.  Then I do not know what happened.  I am now forwarding this to the pharmacy team to address.  Please let the patient know ?

## 2021-12-24 NOTE — Telephone Encounter (Signed)
Pt aware refills sent as requested ./cy ?

## 2021-12-24 NOTE — Telephone Encounter (Addendum)
Per last BMS patient assistance application decision letter, patient did not qualify for pt assistance because she exceeded income threshold as household of one. At that time she had also not paid 3% of household income towards out-of-pocket rx costs. Hwoever based on her income alone, she won't qualify if she hasn't had a decrease to $43,740 or below for 2023. ? ?The $300 is as low as it'll get for Eliquis this month which is still less than the $560 she paid last month but understand it is not sustainable copay for patient to afford month to month. Pharmacy team had advised that her copay would decrease but not that it would be no copay  ? ?I would recommend reapplying for [employer]MS pt assistance if patient has had decrease in income. Patient will need to drop off out of pocket prescriptions costs which she should collect from her pharmacy. She will need to sign BMS application again.  If she has not had decrease in income to below $[phone] she will likely have to switch to warfarin since there are no grants open. ? ?I spoke with patient IN DETAIL about Eliquis this afternoon. Advised that she could pay for medication this month and her copay next month should be $47 (I ran a test claim for Xarelto which showed copay was $47 while the Eliquis was still billed). Patient states she called her insurance herself, and was told that she wouldn't be paying more than about $530 for medication and then copay would decrease to ~$50 per month. I did review that I couldn't speak to the conversation she had with her insurance but this copay would be renewed every calendar year.  ? ?I advised patient that she could alternatively call cardiology clinic to see if they have samples but will ultimately have to pay this copay. I inquired if her 2022 tax return showed that she had decrease in her income that qualified for Eliquis BMS pt assistance, however she was unable to locate her tax return and states she is not feeling well and cannot  find it. She wrote down income threshold for pt assistance program qualification and she will call me back once she.  I reviewed that if she ultimately is unwilling to pay for Eliquis, warfarin would be the only affordable anticoagulant. ? ?Patient again stated that she is not feeling well and said she would think about all of the above information and have to call me back since she is improved. I did advise that I was trying to help to avoid her being off of anticoagulation especially since she is having cardioversion. ? ?Knox Saliva, PharmD, MPH, BCPS ?Clinical Pharmacist (Rheumatology and Pulmonology) ?

## 2021-12-31 ENCOUNTER — Encounter (HOSPITAL_COMMUNITY): Payer: Self-pay | Admitting: Cardiology

## 2022-01-01 ENCOUNTER — Telehealth: Payer: Self-pay | Admitting: Internal Medicine

## 2022-01-02 DIAGNOSIS — G4733 Obstructive sleep apnea (adult) (pediatric): Secondary | ICD-10-CM | POA: Diagnosis not present

## 2022-01-02 DIAGNOSIS — M4125 Other idiopathic scoliosis, thoracolumbar region: Secondary | ICD-10-CM | POA: Diagnosis not present

## 2022-01-02 DIAGNOSIS — J479 Bronchiectasis, uncomplicated: Secondary | ICD-10-CM | POA: Diagnosis not present

## 2022-01-02 DIAGNOSIS — I2699 Other pulmonary embolism without acute cor pulmonale: Secondary | ICD-10-CM | POA: Diagnosis not present

## 2022-01-02 DIAGNOSIS — R011 Cardiac murmur, unspecified: Secondary | ICD-10-CM | POA: Diagnosis not present

## 2022-01-02 DIAGNOSIS — R918 Other nonspecific abnormal finding of lung field: Secondary | ICD-10-CM | POA: Diagnosis not present

## 2022-01-02 DIAGNOSIS — I4819 Other persistent atrial fibrillation: Secondary | ICD-10-CM | POA: Diagnosis not present

## 2022-01-02 DIAGNOSIS — Z7901 Long term (current) use of anticoagulants: Secondary | ICD-10-CM | POA: Diagnosis not present

## 2022-01-02 DIAGNOSIS — I872 Venous insufficiency (chronic) (peripheral): Secondary | ICD-10-CM | POA: Diagnosis not present

## 2022-01-02 DIAGNOSIS — I10 Essential (primary) hypertension: Secondary | ICD-10-CM | POA: Diagnosis not present

## 2022-01-02 NOTE — Telephone Encounter (Signed)
Called patient but she did not answer. Left message for her to call us back. I have pended the [facility] for when she calls back.  ?

## 2022-01-06 ENCOUNTER — Other Ambulatory Visit (HOSPITAL_COMMUNITY): Payer: Self-pay

## 2022-01-06 NOTE — Telephone Encounter (Addendum)
[facility] to check on status of Eliquis. Per pharmacy rep, patient picked up 30-day supply of Eliquis on 12/24/21 ? ?Attempted to run test claim to determine next month's copay but refill too soon. Will able to run on 01/13/22. ? ?Knox Saliva, PharmD, MPH, BCPS ?Clinical Pharmacist (Rheumatology and Pulmonology) ?

## 2022-01-08 ENCOUNTER — Encounter (HOSPITAL_COMMUNITY)
Admission: RE | Disposition: A | Discharge status: Home / Self Care | Payer: Self-pay | Source: Home / Self Care | Attending: Cardiology

## 2022-01-08 ENCOUNTER — Ambulatory Visit (HOSPITAL_COMMUNITY): Payer: Medicare Other | Admitting: Anesthesiology

## 2022-01-08 ENCOUNTER — Encounter (HOSPITAL_COMMUNITY): Payer: Self-pay | Admitting: Cardiology

## 2022-01-08 ENCOUNTER — Other Ambulatory Visit: Payer: Self-pay

## 2022-01-08 ENCOUNTER — Ambulatory Visit (HOSPITAL_BASED_OUTPATIENT_CLINIC_OR_DEPARTMENT_OTHER): Payer: Medicare Other | Admitting: Anesthesiology

## 2022-01-08 ENCOUNTER — Ambulatory Visit (HOSPITAL_COMMUNITY)
Admission: RE | Admit: 2022-01-08 | Discharge: 2022-01-08 | Disposition: A | Discharge status: Home / Self Care | Payer: Medicare Other | Attending: Cardiology | Admitting: Cardiology

## 2022-01-08 ENCOUNTER — Telehealth: Payer: Self-pay | Admitting: Cardiovascular Disease

## 2022-01-08 DIAGNOSIS — I10 Essential (primary) hypertension: Secondary | ICD-10-CM | POA: Insufficient documentation

## 2022-01-08 DIAGNOSIS — I340 Nonrheumatic mitral (valve) insufficiency: Secondary | ICD-10-CM | POA: Insufficient documentation

## 2022-01-08 DIAGNOSIS — M4125 Other idiopathic scoliosis, thoracolumbar region: Secondary | ICD-10-CM | POA: Insufficient documentation

## 2022-01-08 DIAGNOSIS — I2699 Other pulmonary embolism without acute cor pulmonale: Secondary | ICD-10-CM | POA: Insufficient documentation

## 2022-01-08 DIAGNOSIS — I5032 Chronic diastolic (congestive) heart failure: Secondary | ICD-10-CM | POA: Diagnosis not present

## 2022-01-08 DIAGNOSIS — I4891 Unspecified atrial fibrillation: Secondary | ICD-10-CM

## 2022-01-08 DIAGNOSIS — M1990 Unspecified osteoarthritis, unspecified site: Secondary | ICD-10-CM

## 2022-01-08 DIAGNOSIS — I4819 Other persistent atrial fibrillation: Secondary | ICD-10-CM | POA: Diagnosis not present

## 2022-01-08 DIAGNOSIS — Z7901 Long term (current) use of anticoagulants: Secondary | ICD-10-CM | POA: Insufficient documentation

## 2022-01-08 DIAGNOSIS — G4733 Obstructive sleep apnea (adult) (pediatric): Secondary | ICD-10-CM | POA: Diagnosis not present

## 2022-01-08 DIAGNOSIS — I4892 Unspecified atrial flutter: Secondary | ICD-10-CM | POA: Diagnosis not present

## 2022-01-08 DIAGNOSIS — J479 Bronchiectasis, uncomplicated: Secondary | ICD-10-CM | POA: Diagnosis not present

## 2022-01-08 DIAGNOSIS — I509 Heart failure, unspecified: Secondary | ICD-10-CM | POA: Diagnosis not present

## 2022-01-08 DIAGNOSIS — I110 Hypertensive heart disease with heart failure: Secondary | ICD-10-CM

## 2022-01-08 DIAGNOSIS — I484 Atypical atrial flutter: Secondary | ICD-10-CM

## 2022-01-08 DIAGNOSIS — I872 Venous insufficiency (chronic) (peripheral): Secondary | ICD-10-CM | POA: Diagnosis not present

## 2022-01-08 DIAGNOSIS — G4730 Sleep apnea, unspecified: Secondary | ICD-10-CM | POA: Diagnosis not present

## 2022-01-08 HISTORY — PX: CARDIOVERSION: SHX1299

## 2022-01-08 HISTORY — DX: Essential (primary) hypertension: I10

## 2022-01-08 SURGERY — CARDIOVERSION
Anesthesia: General

## 2022-01-08 MED ORDER — SODIUM CHLORIDE 0.9 % IV SOLN: INTRAVENOUS | Status: DC

## 2022-01-08 MED ORDER — PHENYLEPHRINE HCL (PRESSORS) 10 MG/ML IV SOLN
INTRAVENOUS | Status: DC | PRN
  Administered 2022-01-08: 80 ug via INTRAVENOUS

## 2022-01-08 MED ORDER — LIDOCAINE 2% (20 MG/ML) 5 ML SYRINGE
INTRAMUSCULAR | Status: DC | PRN
  Administered 2022-01-08: 40 mg via INTRAVENOUS

## 2022-01-08 MED ORDER — LACTATED RINGERS IV SOLN: INTRAVENOUS | Status: DC | PRN

## 2022-01-08 MED ORDER — PROPOFOL 10 MG/ML IV BOLUS
INTRAVENOUS | Status: DC | PRN
  Administered 2022-01-08: 40 mg via INTRAVENOUS

## 2022-01-08 NOTE — Anesthesia Procedure Notes (Signed)
Procedure Name: General with mask airway ?Date/Time: 01/08/2022 11:05 AM ?Performed by: Lorie Phenix, CRNA ?Pre-anesthesia Checklist: Patient identified, Emergency Drugs available, Suction available, Patient being monitored and Timeout performed ?Patient Re-evaluated:Patient Re-evaluated prior to induction ?Oxygen Delivery Method: Ambu bag ? ? ? ? ?

## 2022-01-08 NOTE — Telephone Encounter (Signed)
Patient is requesting a call back to discuss her medication instructions and parameters for altering medication when her HR changes. ?

## 2022-01-08 NOTE — Interval H&P Note (Signed)
History and Physical Interval Note: ? ?01/08/2022 ?10:52 AM ? ?Janet Nguyen  has presented today for surgery, with the diagnosis of ATRIAL FLUTTER.  The various methods of treatment have been discussed with the patient and family. After consideration of risks, benefits and other options for treatment, the patient has consented to  Procedure(s): ?CARDIOVERSION (N/A) as a surgical intervention.  The patient's history has been reviewed, patient examined, no change in status, stable for surgery.  I have reviewed the patient's chart and labs.  Questions were answered to the patient's satisfaction.   ? ? ?Freada Bergeron ? ? ?

## 2022-01-08 NOTE — Procedures (Signed)
Procedure: Electrical Cardioversion ?Indications:  Atrial Fibrillation ? ?Procedure Details: ? ?Consent: Risks of procedure as well as the alternatives and risks of each were explained to the (patient/caregiver).  Consent for procedure obtained. ? ?Time Out: Verified patient identification, verified procedure, site/side was marked, verified correct patient position, special equipment/implants available, medications/allergies/relevent history reviewed, required imaging and test results available. PERFORMED. ? ?Patient placed on cardiac monitor, pulse oximetry, supplemental oxygen as necessary.  ?Sedation given:  propofol 45m; lidocaine 436m?Pacer pads placed anterior and posterior chest. ? ?Cardioverted 1 time(s).  ?Cardioversion with synchronized biphasic 150J shock. ? ?Evaluation: ?Findings: Post procedure EKG shows: NSR ?Complications: None ?Patient did tolerate procedure well. ? ?Time Spent Directly with the Patient: ? ?3566mtes  ? ?Janet Nguyen/08/2022, 11:05 AM  ?

## 2022-01-08 NOTE — Anesthesia Postprocedure Evaluation (Signed)
Anesthesia Post Note ? ?Patient: Janet Nguyen ? ?Procedure(s) Performed: CARDIOVERSION ? ?  ? ?Patient location during evaluation: PACU ?Anesthesia Type: General ?Level of consciousness: awake and alert, oriented and patient cooperative ?Pain management: pain level controlled ?Vital Signs Assessment: post-procedure vital signs reviewed and stable ?Respiratory status: spontaneous breathing, nonlabored ventilation and respiratory function stable ?Cardiovascular status: blood pressure returned to baseline and stable ?Postop Assessment: no apparent nausea or vomiting ?Anesthetic complications: no ? ? ?No notable events documented. ? ?Last Vitals:  ?Vitals:  ? 01/08/22 1135 01/08/22 1145  ?BP: 97/61 (!) 98/53  ?Pulse: (!) 51 (!) 51  ?Resp: 17 13  ?Temp:    ?SpO2: 96% 96%  ?  ?Last Pain:  ?Vitals:  ? 01/08/22 0956  ?TempSrc: Oral  ?PainSc: 0-No pain  ? ? ?  ?  ?  ?  ?  ?  ? ?Jarome Matin Xian Alves ? ? ? ? ?

## 2022-01-08 NOTE — Telephone Encounter (Signed)
Spoke with Janet Nguyen, she is confused about the instructions on her paperwork because it says do not take the metoprolol is heart rate is 760. Explained to the patient that means less than 60. The 7 is a less than symbol. Patient voiced understanding and all questions answered. ?

## 2022-01-08 NOTE — Transfer of Care (Signed)
Immediate Anesthesia Transfer of Care Note ? ?Patient: Janet Nguyen ? ?Procedure(s) Performed: CARDIOVERSION ? ?Patient Location: PACU ? ?Anesthesia Type:General ? ?Level of Consciousness: drowsy ? ?Airway & Oxygen Therapy: Patient Spontanous Breathing ? ?Post-op Assessment: Report given to RN and Post -op Vital signs reviewed and stable ? ?Post vital signs: Reviewed and stable ? ?Last Vitals:  ?Vitals Value Taken Time  ?BP    ?Temp    ?Pulse    ?Resp    ?SpO2    ? ? ?Last Pain:  ?Vitals:  ? 01/08/22 0956  ?TempSrc: Oral  ?PainSc: 0-No pain  ?   ? ?  ? ?Complications: No notable events documented. ?

## 2022-01-08 NOTE — Discharge Instructions (Signed)

## 2022-01-08 NOTE — Anesthesia Preprocedure Evaluation (Addendum)
Anesthesia Evaluation  ?Patient identified by MRN, date of birth, ID band ?Patient awake ? ? ? ?Reviewed: ?Allergy & Precautions, NPO status , Patient's Chart, lab work & pertinent test results, reviewed documented beta blocker date and time  ? ?Airway ?Mallampati: II ? ?TM Distance: >3 FB ?Neck ROM: Full ? ? ? Dental ?no notable dental hx. ?(+) Teeth Intact, Dental Advisory Given ?  ?Pulmonary ?sleep apnea (doesnt wear CPAP) ,  ?ILD  ?  ?Pulmonary exam normal ?breath sounds clear to auscultation ? ? ? ? ? ? Cardiovascular ?hypertension (132/75 in preop), Pt. on medications and Pt. on home beta blockers ?+CHF  ?+ dysrhythmias (eliquis) Atrial Fibrillation and Supra Ventricular Tachycardia + Valvular Problems/Murmurs (mod MR) MR  ?Rhythm:Irregular Rate:Tachycardia ? ?Echo 11/2021 ??1. Left ventricular ejection fraction, by estimation, is 60 to 65%. Left  ?ventricular ejection fraction by 2D MOD biplane is 60.8 %. The left  ?ventricle has normal function. The left ventricle has no regional wall  ?motion abnormalities. There is mild left  ?ventricular hypertrophy. Left ventricular diastolic function could not be  ?evaluated.  ??2. Left atrial size was moderately dilated.  ??3. Right atrial size was moderately dilated.  ??4. The pericardial effusion is circumferential. There is no evidence of  ?cardiac tamponade.  ??5. The mitral valve is myxomatous. Moderate mitral valve regurgitation.  ?There is moderate late systolic prolapse of the middle scallop of the  ?posterior leaflet of the mitral valve.  ??6. Tricuspid valve regurgitation is moderate.  ??7. The aortic valve is tricuspid. Aortic valve sclerosis is present, with  ?no evidence of aortic valve stenosis.  ??8. There is normal pulmonary artery systolic pressure. The estimated  ?right ventricular systolic pressure is 03.8 mmHg.  ??9. The inferior vena cava is dilated in size with >50% respiratory  ?variability, suggesting right  atrial pressure of 8 mmHg.  ?  ?Neuro/Psych ? Headaches, negative psych ROS  ? GI/Hepatic ?Neg liver ROS, GERD  Controlled,  ?Endo/Other  ?negative endocrine ROS ? Renal/GU ?negative Renal ROS  ?negative genitourinary ?  ?Musculoskeletal ? ?(+) Arthritis , PMR  ? Abdominal ?  ?Peds ? Hematology ?negative hematology ROS ?(+)   ?Anesthesia Other Findings ? ? Reproductive/Obstetrics ?negative OB ROS ? ?  ? ? ? ? ? ? ? ? ? ? ? ? ? ?  ?  ? ? ? ? ? ? ? ?Anesthesia Physical ?Anesthesia Plan ? ?ASA: 3 ? ?Anesthesia Plan: General  ? ?Post-op Pain Management:   ? ?Induction: Intravenous ? ?PONV Risk Score and Plan: TIVA and Treatment may vary due to age or medical condition ? ?Airway Management Planned: Natural Airway and Mask ? ?Additional Equipment: None ? ?Intra-op Plan:  ? ?Post-operative Plan:  ? ?Informed Consent: I have reviewed the patients History and Physical, chart, labs and discussed the procedure including the risks, benefits and alternatives for the proposed anesthesia with the patient or authorized representative who has indicated his/her understanding and acceptance.  ? ? ? ?Dental advisory given ? ?Plan Discussed with: CRNA ? ?Anesthesia Plan Comments:   ? ? ? ? ? ?Anesthesia Quick Evaluation ? ?

## 2022-01-09 ENCOUNTER — Encounter (HOSPITAL_COMMUNITY): Payer: Self-pay | Admitting: Cardiology

## 2022-01-15 LAB — CBC
Hematocrit: 39.7 % (ref 34.0–46.6)
Hemoglobin: 13.2 g/dL (ref 11.1–15.9)
MCH: 30.4 pg (ref 26.6–33.0)
MCHC: 33.2 g/dL (ref 31.5–35.7)
MCV: 92 fL (ref 79–97)
Platelets: 258 10*3/uL (ref 150–450)
RBC: 4.34 x10E6/uL (ref 3.77–5.28)
RDW: 11.8 % (ref 11.7–15.4)
WBC: 8.3 10*3/uL (ref 3.4–10.8)

## 2022-01-15 LAB — BASIC METABOLIC PANEL
BUN/Creatinine Ratio: 16 (ref 12–28)
BUN: 16 mg/dL (ref 8–27)
Calcium: 9.8 mg/dL (ref 8.7–10.3)
Chloride: 91 mmol/L — ABNORMAL LOW (ref 96–106)
Creatinine, Ser: 1.01 mg/dL — ABNORMAL HIGH (ref 0.57–1.00)
Glucose: 95 mg/dL (ref 70–99)
Potassium: 4.9 mmol/L (ref 3.5–5.2)
Sodium: 129 mmol/L — ABNORMAL LOW (ref 134–144)
eGFR: 54 mL/min/{1.73_m2} — ABNORMAL LOW (ref >59)

## 2022-01-15 LAB — MAGNESIUM: Magnesium: 1.9 mg/dL (ref 1.6–2.3)

## 2022-01-16 ENCOUNTER — Ambulatory Visit: Payer: Medicare Other | Admitting: Internal Medicine

## 2022-01-24 ENCOUNTER — Encounter: Payer: Self-pay | Admitting: Internal Medicine

## 2022-01-24 ENCOUNTER — Ambulatory Visit (INDEPENDENT_AMBULATORY_CARE_PROVIDER_SITE_OTHER): Payer: Medicare Other | Admitting: Internal Medicine

## 2022-01-24 VITALS — BP 116/68 | HR 66 | Temp 97.9°F | Ht 62.0 in | Wt 114.2 lb

## 2022-01-24 DIAGNOSIS — J479 Bronchiectasis, uncomplicated: Secondary | ICD-10-CM | POA: Diagnosis not present

## 2022-01-24 DIAGNOSIS — R911 Solitary pulmonary nodule: Secondary | ICD-10-CM

## 2022-01-24 DIAGNOSIS — I2699 Other pulmonary embolism without acute cor pulmonale: Secondary | ICD-10-CM | POA: Diagnosis not present

## 2022-01-24 DIAGNOSIS — R0609 Other forms of dyspnea: Secondary | ICD-10-CM

## 2022-01-24 MED ORDER — SPIRIVA RESPIMAT 1.25 MCG/ACT IN AERS: 2.0000 | INHALATION_SPRAY | Freq: Every day | RESPIRATORY_TRACT | 0 refills | Status: DC

## 2022-01-24 NOTE — Patient Instructions (Addendum)
DOE (dyspnea on exertion) ? ? ?- scoliosis, bronchiectasis, mitral valve leak and and grade 2 heart muscle stiffness, atrial flutter contributing to respiratory symptoms ?- stable ? ? ?Plan ?-Supportive care ?- Address individual components ? ?#Pulmonary embolism diagnosed January 2023 ? ? -Cotninue eliquis  ?- Too bad it Is very expenseive ? ?Plan ? -Cotninue eliquis = minimum 6 months since Jan 2023 and reassess with d-dimer ? ?#Bronchiectasis ? ?-Appears  Spiriva and flutter valve have not been given good opportunity fo see benefit ?-We thought we gave clear instruction on usage but apologize if it was not ? ? ?Plan ? -Continue Spiriva  2puff once daily ?- Flutter valve 5 times daily ? ?Right lower lobe lung nodule 7.20m on CT 10/09/21 - new ? ?- low risk for lung cancer  ? ?Plan ? - repeat CT chest without contrast end of May-July 2023 (4-6 months) ?- ? ?Positive rheumatoid factor January 2023 ? ?- shared decisio  making we wil follow expectantly since joints are okay ? ? Plan ? - At some point we can discuss visiting rheumatologist but hold off for the moment ? ? ?Mitral valve insufficiency, unspecified etiology ?Grade II diastolic dysfunction ?ATrial flutter ? ? ?Plan' ?-  per Dr KClaiborne Billings? ? ? ?Followup ?-will call with CT rsults if done soon ?- otherwise 2-3 month followup ?

## 2022-01-24 NOTE — Progress Notes (Signed)
? ? ? ? ? ? ?OV 10/22/2021 ? ?Subjective:  ?Patient ID: Janet Nguyen, female , DOB: [birth year] , age 88 y.o. , MRN: [id number] , AD[address]ESS: [address] ?[place] [address] ?PCP Janet Nguyen ?Patient Care Team: ?Janet Nguyen as PCP - General (Family Medicine) ?Janet Nguyen as PCP - Cardiology (Cardiology) ? ?This Provider for this visit: Treatment Team:  ?Attending Provider: Brand Males, Nguyen ? ? ? ?10/22/2021 -   ?Chief Complaint  ?Patient presents with  ? Consult  ?  Pt had Nguyen recent CT performed which is the reason for today's visit.  ? ? ? ?HPI ?Janet Nguyen 88 y.o. -referred for shortness of breath and cough.  Has known severe scoliosis.  Found to have bronchiectasis on the CT chest.  History is provided by the patient and review of the medical records.  She is Nguyen very good historian.  She is divorced.  She has grown children who all live all over the country.  She has several grandchildren.  She says she is known to have deviated nasal septum, celiac disease for which she is on Nguyen restrictive diet, sleep apnea but she cannot tolerate CPAP.  She is most importantly known to have severe scoliosis that was first diagnosed in her younger years.  She says after age 17 is around when they made the diagnosis although she suspect she has had Nguyen longer period she is just on observation treatment.  She did yoga and severe exercise to keep herself physically fit.  Over the years scoliosis is gotten worse that she is lost 6 inches in total height.  This is producing physical pressure effects.  She thinks with the onset of the pandemic and particularly with worsening of the scoliosis and particularly for the last few months or so she said dyspnea on exertion [echo did show grade 2 diastolic dysfunction], decreased socializing, decreased sleep, feeling depressed.  Also she feels her migraines are worse.  She when she wakes up in the morning she feels an elephant is on the  chest.  She also has Nguyen cough.  The respiratory symptoms are mild overall.  But she does definitely have it and is definitely new.  Sometime back Nguyen year ago or 2 years ago she is to be able to walk at least Nguyen mile now she gets dyspneic walking Nguyen block. ? ?Of note she reports Nguyen few episodes of severe vomiting acute episodes 2 times in 2022.  Idiopathic.  There is associated heartburn ? ?She has had the COVID-vaccine but does not have the COVID itself ? ?Review of system positive for fatigue for the last several months arthralgia for the last several years. ? ?Denies any family history of lung disease ? ?Denies any substance abuse ? ?Lives in Nguyen 86 year old home.  Detailed review shows no organic dust antigen exposure.  She does have Nguyen bird feeder. ? ?Occupational history: Detail organic and inorganic antigen exposure history is negative ? ? ? ?CT Chest data 10/09/21 -  ? ? ?IMPRESSION: ?1. 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 Chronic aspiration ?could have Nguyen similar appearance. ?2. Part solid nodule of the right lower lobe measuring 7.5 mm in ?mean diameter with 4 mm solid component. Follow-up non-contrast CT ?recommended at 3-6 months to confirm  persistence. If unchanged, and ?solid component remains <6 mm, annual CT is recommended until 5 ?years of stability has been established. If persistent these nodules ?should be considered highly suspicious if the solid component of the ?nodule is 6 mm or greater in size and enlarging. This recommendation ?follows the consensus statement: Guidelines for Management of ?Incidental Pulmonary Nodules Detected on CT Images: From the ?Fleischner Society 2017; Radiology 2017; [phone]. ?3.  Aortic Atherosclerosis (ICD10-I70.0). ?  ?  ?Electronically Signed ?  By: Janet Nguyen M.D. ?  On: 10/09/2021 15:34 ? ?No results found. ? ? ?11/06/2021 Follow up : PE ? ? ?88 yo female never smoker seen for pulmonary consult 10/22/21 for bronchiectasis and shortness of breath found to have Pulmonary embolism on VQ scan .  ?Medical history of severe scoliosis /kyphosis , OSA -CPAP intolerant  ? ?TEST/EVENTS :  ?VQ scan 10/25/21- multiple moderate perfusion defects with RML/RLL + PE.  ?Venous Doppler 09/2021 neg  ?Echo Nml EF , Gr 2 DD, Elevated PAP 66mHg. Severe LAE and Mod RA, mod to severe MR ? ? ?Patient returns for Nguyen 1 week follow up . Recently seen for pulmonary consult for bronchiectasis and shortness of breath. Labs showed elevated  D Dimer . Subsequent VQ scan on 10/25/21 was positive for PE. 2 D echo showed mild elevated PAP at 379mg, Gr 2 DD , Severe LAE and Mod RA, mod to severe MR. Venous doppler was negative for DVT.  ?Has some minimally productive dry cough. Was started on Spiriva but did not take.  ?She was started on Eliquis , she has started on starter pack .  Unfortunately patient's insurance does not cover and prescription is greater than $600.  We discussed patient assistance program.  We have also contacted our pharmacy department to help us[place]orea[place] alternative options.  Patient education on anticoagulation therapy. ? ?Patient says she has some minimum daily cough.  She was started on Spiriva last visit.  Unfortunately did not start this.  We discussed the reason for Spiriva.  Went over her recent CT chest that shows some bronchiectasis.  Autoimmune labs did show elevated rheumatoid factor and ANA, CCP was negative.  Patient has some mild arthritis but no significant joint swelling.  Sjogren's panel was negative. ?Typically active with light walking. Limited with back pain with scoliosis.  ?Lives alone. Drives. Does light housework , shopping. Has 4 kids, lots of grandkids.  ? ?11/22/2021 ?Follow Up PE/ Bronchiectasis/ Exertional dyspnea ? ?PaMOZELLA REXRODEs Nguyen [age].o. female  never smoker seen for pulmonary consult 10/22/21 for bronchiectasis and shortness of breath. Additionally she was  found to have Pulmonary embolism on VQ scan . PE is unprovoked.  May need to consider lifelong anticoagulation or at least for 6 months to 1 year of therapy.  Will need to weigh the risk and benefit ratio.She is followed by Dr. RaChase Caller ? ?Pt. Presents for follow up . She states she has been doing well. She has been compliant with her  Eliquis. No bleeding.  She does not use NSAIDS. We are working on getting her reduced cost medication through the pharmacy. She understands she cannot skip Nguyen dose of Eliquis. .We reviewed bleeding precautions and safe use of sharp objects, in addition to fall precautions. She denies any lower extremity swelling.No bruising noted ? ?She does continue to have exertional dyspnea.  ?She states she has been compliant with her Spiriva. She states she is unsure if it is helping or not,  but is willing to try this for Nguyen bit longer.   ? ?She is using her flutter valve 8-10 times Nguyen day. 2-4 blows at Nguyen time.She states she has not been able to cough anything up. We will add Mucinex to see if this helps. We discussed that this is Nguyen daily maintenance for her bronchiectasis. She is very compliant. She has had many questions today, that we have addressed.   ? ?PFT's look restrictive, which is expected with her degree of scoliosis.  ?DLCO with mild/Moderate reduction  ?Lung volumes are slightly reduce ? ?OV 11/28/2021 ? ?Subjective:  ?Patient ID: Janet Nguyen, female , DOB: Feb 19, 1932 , age 41 y.o. , MRN: [id number] , ADDRESS: [address] ?[place] [address] ?PCP Janet Nguyen ?Patient Care Team: ?Janet Nguyen as PCP - General (Family Medicine) ?Janet Nguyen as PCP - Cardiology (Cardiology) ? ?This Provider for this visit: Treatment Team:  ?Attending Provider: Brand Males, Nguyen ? ? ? ?11/28/2021 -   ?Chief Complaint  ?Patient presents with  ?  Follow-up  ?  Pt states she has been having problems with the cost of the Eliquis medication. States she took her last pill today 3/2. States that she has been more SOB than before.  ? ?#Multifactorial dyspnea ?

## 2022-01-29 ENCOUNTER — Ambulatory Visit (INDEPENDENT_AMBULATORY_CARE_PROVIDER_SITE_OTHER): Payer: Medicare Other | Admitting: Cardiovascular Disease

## 2022-01-29 ENCOUNTER — Encounter: Payer: Self-pay | Admitting: Cardiovascular Disease

## 2022-01-29 VITALS — BP 120/70 | HR 97 | Ht 61.0 in | Wt 116.8 lb

## 2022-01-29 DIAGNOSIS — M4125 Other idiopathic scoliosis, thoracolumbar region: Secondary | ICD-10-CM

## 2022-01-29 DIAGNOSIS — G4733 Obstructive sleep apnea (adult) (pediatric): Secondary | ICD-10-CM

## 2022-01-29 DIAGNOSIS — I2699 Other pulmonary embolism without acute cor pulmonale: Secondary | ICD-10-CM

## 2022-01-29 DIAGNOSIS — R918 Other nonspecific abnormal finding of lung field: Secondary | ICD-10-CM | POA: Diagnosis not present

## 2022-01-29 DIAGNOSIS — J479 Bronchiectasis, uncomplicated: Secondary | ICD-10-CM

## 2022-01-29 DIAGNOSIS — I484 Atypical atrial flutter: Secondary | ICD-10-CM

## 2022-01-29 DIAGNOSIS — I4819 Other persistent atrial fibrillation: Secondary | ICD-10-CM | POA: Diagnosis not present

## 2022-01-29 DIAGNOSIS — R011 Cardiac murmur, unspecified: Secondary | ICD-10-CM

## 2022-01-29 DIAGNOSIS — I471 Supraventricular tachycardia, unspecified: Secondary | ICD-10-CM

## 2022-01-29 DIAGNOSIS — I10 Essential (primary) hypertension: Secondary | ICD-10-CM | POA: Diagnosis not present

## 2022-01-29 DIAGNOSIS — Z7901 Long term (current) use of anticoagulants: Secondary | ICD-10-CM

## 2022-01-29 MED ORDER — DILTIAZEM HCL ER COATED BEADS 180 MG PO CP24: 180.0000 mg | ORAL_CAPSULE | Freq: Every morning | ORAL | 6 refills | Status: DC

## 2022-01-29 NOTE — Progress Notes (Signed)
50,000 patient ID: Janet Nguyen, female   DOB: [birth year], 88 y.o.   MRN: [id number] ? ? ? ? ?Primary  M.D.: Dr. Mardene Sayer ? ?HPI: Janet Nguyen is a 88 y.o. female who presents for a  followup cardiology evaluation following her recent DC cardioversion ? ?Ms. Janet Nguyen has a history of documented SVT and also has PACs and PVCs  treated with beta blocker therapy. In April 2014 I further titrated her beta blocker therapy after cardiac event monitor revealed several bursts of recurrent SVT up to 177 beats per minute in March.  In July after she had had 2 episodes of chest fluttering which each lasted over 30 minutes which he did take metoprolol tartrate with relief I recommended further titration of her Toprol to 75 mg in the morning and 50 mg at night and if necessary she could further titrate this to 75 twice a day. ? ?She has a history of obstructive sleep apnea but despite multiple attempts at CPAP utilization she has not been able to tolerate this. She was referred to Dr. [name] and has a customized dental appliance with mandibular advancement with improvement in some of her symptomatology. Due concern that her teeth may be moving in more recently she has has not been using customized appliance daily but has been using her old non-customized mouthguard.  At times shen has awakened abruptly from a dream with her heart pounding and a sensation of hot flashes, gasping for breath.  ? ?She also states that her scoliosis is getting worse. She does have left-sided musculoskeletal type chest pain due to her spine angulation.  Beause of her significant scoliosis, she feels she must sleep on her back.  ? ?She has a history of GERD for which she has been taking Nexium.  She presents for evaluation. ? ?I had scheduled her for an overnight oximetry to see if she is a candidate for supplemental oxygen at nighttime since she refused to use CPAP and only very rarely uses her customized mouthpiece.  She does wear  a mouthguard to reduce bruxism.  Oximetry study was performed overnight on February 23/24.  Her mean oxygen saturation was 93.12%.  She spent 12 minutes and 16 seconds with O2 sat duration below 88% with the lowest O2 sat duration of 80%. I tried to set her up for supplemental oxygen at bedtime.  However, she has been denied for this on multiple occasions by her insurance/Medicare. ? ?She feels that she is sleeping better.  She can only sleep in her back due to scoliosis.  Her left side is concave; her right side is convex.  She has had issues with labile blood pressure. She has had issues with lower back discomfort and sciatica leading to emergency room evaluation in November 2016.  She saw Dr. [name] for neurosurgical evaluation. She has migraine headaches. She  Recently saw Dr. [name] for these migraine headaches.  She has been on Toprol-XL 75 mg twice a day for her palpitations and presently denies any awareness an extra heartbeats. She takes Xanax on an as-needed basis for anxiety.  She has been taking Nexium 20, no grams every other day for GERD. She states her scoliosis is getting worse.  Has been experiencing more fatigue.  She also notes some occasional left hand numbness.  In a mouth guard but no ureteral longer uses her mandibular advancement device and not tolerate CPAP therapy for her obstructive sleep apnea.   ? ?She had experienced left-sided chest discomfort  which most likely was related to her significant scoliosis the potential neuropathy causing intermittent left arm and hand numbness.  I slightly reduced her Toprol-XL which she had been taking 150 mg daily and a 75 twice a day regimen to 75 and milligrams in the morning and 50 mg with ultimate plan to decrease this to 50 twice a day.  She states when she reduce this, she began to notice more optical migraines and resume taking the higher dose Toprol at 75 twice a day with improvement.  She has seen Dr. [name] for her paresthesias.  She admits to  significant anxiety.  She had requested zolpidem for sleep initiation and maintenance.  In the past, we had tried 6.25 slow-release version to help with sleep maintenance, but due to cost issues preferred the 5 mg sleep initiation dose. ? ?She experiences optical migraine headaches.  She was being seen by neurologist, Dr. Melton Alar  She continues to have issues with her scoliosis causing discomfort in her neck and arm due to her distortion.  He tells me she underwent endoscopy and colonoscopy as well as banding of hemorrhoids.  She had a benign polyp removed.  She has issues with spastic colon.  Her sleep is better with zolpiden 5 mg. ? ?She saw Kerin Ransom on 11/13/2016.  She had noticed that her hair was "thinning" and was concerned about this being the result of Toprol.  He suggested possibly decreasing Toprol but she preferred not until she had seen me.  Since that time, she continues to experience some optical migraines.  She has noticed some leg left neck discomfort.  She denies any patches of hair loss.  She denies differential arm weakness.  She continues to have difficulty with her scoliosis with hip pain and rib cage discomfort.  ? ?When I saw her in March 2018, there was a blood pressure differential of 118/78 in the left arm and 140/80 in the right arm.  She subsequently underwent upper extremity Doppler evaluation on 12/25/2016.  She was noted have mild heterogeneous plaque with stable 1-39% bilateral internal carotid stenoses.  She had normal subclavian arteries bilaterally.  There are patent vertebral arteries with antegrade flow.  She did not have any significant blood pressure differential and had triphasic waveforms in both her right and biphasic in her left brachial artery.  Left blood pressure was 8 mm higher than the right brachial pressure. ? ?At times, she continues to experience some vague chest wall symptoms, which most likely related to her posture.  She denies significant shortness of breath.   She has to sleep on her back because of her scoliosis.  Uses a chin strap to prevent oral breathing.  She is not on CPAP.  ? ?In June 2019 she had concerns about some of her medications possibly causing hair loss or memory loss.  She admits to having dry eyes.  She continues to have difficulty from her scolWsaw her in November 2019 at which time she,  felt well from a cardiac standphk and apparently had a new oral appliance made.  She used this initially but then started to notice some teeth discomfort.  Apparently this again was treated by Dr.e still admits to being tired.  She has issues with her abdomen being tight which she believes is related to her scoliosis.  Her GERD is controlled with pantoprazole.   ? ?She was seen by Dr. Mardene Sayer for her primary care.  She had developed a fungal infection of her toenail.  He had given her to [place] fine 20 mg.  She has not started this and was hesitant to do this due to potential interaction with metoprolol.  She also was evaluated by Dr. Brett Fairy.  According to Ms. Litsey she did not evaluate her ophthalmic migraines.  However she was planning to do a possible home study to reassess her sleep apnea.  In the past she did not tolerate any CPAP therapy and although initially tolerated customized oral appliance this seemed to cause difficulty with movement of her teeth.  She has been using the CALM app on her smart phone to help with relaxation and improve sleep and is chronically dependent on low-dose zolpidem prescribed by Dr. Harrington Challenger.  She denies chest pain.  Her palpitations are well controlled with her current dose of metoprolol 75 mils twice a day and she can to be on low-dose amlodipine 2.5 mg.   ? ?Since I saw her in June 2020, she underwent the home study by Dr. Theodoro Clock was done after several studies with no data.  According to Dr. Edwena Felty note, AHI was 26.5 which was moderate and during REM sleep AHI rose to 34.9.  Snoring was not recorded.  Apparently  there was discussion of possible Financial planner.  Ms. Portales is not interested. ? ?I saw her in October 2020 at which time she had continued issues with migraines and some vertigo for which he ta

## 2022-01-29 NOTE — Patient Instructions (Addendum)
Medication Instructions:  ? ? INCREASE  TO 180 MG DILTIAZEM  DAILY EVERY MORNING ? ? STOP TAKING  DILTIAZEM  120 MG ? ? ? CONTINUE TAKING METOPROLOL SUCCINATE 50 MG TWICE A DAY  ? ? CONTINUE ALL OTHER MEDICATIONS ? ? ?*If you need a refill on your cardiac medications before your next appointment, please call your pharmacy* ? ? ?Lab Work: ?CBC ?CMP ?magnesium ?TSH ?If you have labs (blood work) drawn today and your tests are completely normal, you will receive your results only by: ?MyChart Message (if you have MyChart) OR ?A paper copy in the mail ?If you have any lab test that is abnormal or we need to change your treatment, we will call you to review the results. ? ? ?Testing/Procedures: ? ?Not needed ? ?Follow-Up: ?At [facility], you and your health needs are our priority.  As part of our continuing mission to provide you with exceptional heart care, we have created designated Provider Care Teams.  These Care Teams include your primary Cardiologist (physician) and Advanced Practice Providers (APPs -  Physician Assistants and Nurse Practitioners) who all work together to provide you with the care you need, when you need it. ? ?  ? ?Your next appointment:   ?1 week(s) ? ?The format for your next appointment:   ?In Person ? ?Provider:   ?You will follow up in the [facility] located at [facility] ?Your provider will be: ?Roderic Palau, NP or Clint R. Fenton, PA-C ? ? ?Your physician recommends that you schedule a follow-up appointment in:   ONCE A DECISION HAS BEEN DONE BY AFIB. CLINIC ? ? ? ?Other Instructions  ? ?You have been referred to Afib clinic for  atrial fibrillation   in 1 weeks  ( week of May 8,2023 ) ? ? ? ? ? ?     [facility] ?                                     [address]  ? ?                                     Phone  [phone] ? ? ?                                          Please note  ? ?Parking for the office is located in the parking  garage under our building ? ? ?[place]: ? ?-[place]  ? ?-[place] (1ST STOPLIGHT) ? ?- TURN RIGHT INTO [address] ON RIGHT ENTRANCE C)  ?-BEAR TO THE RIGHT AND YOU WILL SEE A BLUE SIGN "HEART&VASCULAR CENTER  PARKING CODE REQUIRED" ? ?- Enter code:   ? ?ONCE YOU PARK, TAKE THE ELEVATOR TO TH 1st FLOOR ? ?---------------------------------------------------------------------------------------------------- ? ?FROM [address] ? ?-TURN ONTO [place]  ? ?-[place] ENTRANCE ON LEFT (ENTRANCE C) ? ?-BEAR TO THE RIGHT AND YOU WILL SEE A BLUE SIGN "[facility] C[facility] PARKING CODE REQUIRED" ? ?- ENTER  CODE   ? ?- ONCE YOU PARK, TAKE THE ELEVATOR TO THE 1st FLOOR.  ?

## 2022-01-30 LAB — COMPREHENSIVE METABOLIC PANEL
ALT: 24 IU/L (ref 0–32)
AST: 28 IU/L (ref 0–40)
Albumin/Globulin Ratio: 1.5 (ref 1.2–2.2)
Albumin: 4.6 g/dL (ref 3.6–4.6)
Alkaline Phosphatase: 69 IU/L (ref 44–121)
BUN/Creatinine Ratio: 19 (ref 12–28)
BUN: 20 mg/dL (ref 8–27)
Bilirubin Total: 0.5 mg/dL (ref 0.0–1.2)
CO2: 23 mmol/L (ref 20–29)
Calcium: 9.8 mg/dL (ref 8.7–10.3)
Chloride: 95 mmol/L — ABNORMAL LOW (ref 96–106)
Creatinine, Ser: 1.05 mg/dL — ABNORMAL HIGH (ref 0.57–1.00)
Globulin, Total: 3 g/dL (ref 1.5–4.5)
Glucose: 100 mg/dL — ABNORMAL HIGH (ref 70–99)
Potassium: 4.8 mmol/L (ref 3.5–5.2)
Sodium: 133 mmol/L — ABNORMAL LOW (ref 134–144)
Total Protein: 7.6 g/dL (ref 6.0–8.5)
eGFR: 51 mL/min/{1.73_m2} — ABNORMAL LOW (ref >59)

## 2022-01-30 LAB — MAGNESIUM: Magnesium: 1.9 mg/dL (ref 1.6–2.3)

## 2022-01-30 LAB — TSH: TSH: 0.98 u[IU]/mL (ref 0.450–4.500)

## 2022-01-30 LAB — CBC
Hematocrit: 41.4 % (ref 34.0–46.6)
Hemoglobin: 13.7 g/dL (ref 11.1–15.9)
MCH: 30.1 pg (ref 26.6–33.0)
MCHC: 33.1 g/dL (ref 31.5–35.7)
MCV: 91 fL (ref 79–97)
Platelets: 296 10*3/uL (ref 150–450)
RBC: 4.55 x10E6/uL (ref 3.77–5.28)
RDW: 12 % (ref 11.7–15.4)
WBC: 7.3 10*3/uL (ref 3.4–10.8)

## 2022-02-02 ENCOUNTER — Encounter: Payer: Self-pay | Admitting: Cardiovascular Disease

## 2022-02-03 ENCOUNTER — Telehealth: Payer: Self-pay | Admitting: Internal Medicine

## 2022-02-03 NOTE — Telephone Encounter (Signed)
Patient states that [facility] is faxing a form for Dr. Chase Caller to sign to confirm patient needs Eliquis so she can get it at a cheaper price. Please be on the look out.  ? ?Call back number is [phone].  ?

## 2022-02-04 ENCOUNTER — Telehealth: Payer: Self-pay | Admitting: Pharmacist

## 2022-02-04 ENCOUNTER — Encounter (HOSPITAL_COMMUNITY): Payer: Self-pay | Admitting: Physician Assistant

## 2022-02-04 ENCOUNTER — Ambulatory Visit (HOSPITAL_COMMUNITY)
Admission: RE | Admit: 2022-02-04 | Discharge: 2022-02-04 | Disposition: A | Discharge status: Home / Self Care | Payer: Medicare Other | Source: Ambulatory Visit | Attending: Physician Assistant | Admitting: Physician Assistant

## 2022-02-04 ENCOUNTER — Ambulatory Visit: Payer: Medicare Other | Admitting: Cardiovascular Disease

## 2022-02-04 VITALS — BP 118/78 | HR 125 | Ht 61.0 in | Wt 112.4 lb

## 2022-02-04 DIAGNOSIS — I341 Nonrheumatic mitral (valve) prolapse: Secondary | ICD-10-CM | POA: Diagnosis not present

## 2022-02-04 DIAGNOSIS — I5032 Chronic diastolic (congestive) heart failure: Secondary | ICD-10-CM | POA: Insufficient documentation

## 2022-02-04 DIAGNOSIS — D6869 Other thrombophilia: Secondary | ICD-10-CM | POA: Diagnosis not present

## 2022-02-04 DIAGNOSIS — I471 Supraventricular tachycardia: Secondary | ICD-10-CM | POA: Diagnosis not present

## 2022-02-04 DIAGNOSIS — G4733 Obstructive sleep apnea (adult) (pediatric): Secondary | ICD-10-CM | POA: Insufficient documentation

## 2022-02-04 DIAGNOSIS — I110 Hypertensive heart disease with heart failure: Secondary | ICD-10-CM | POA: Insufficient documentation

## 2022-02-04 DIAGNOSIS — Z7901 Long term (current) use of anticoagulants: Secondary | ICD-10-CM | POA: Diagnosis not present

## 2022-02-04 DIAGNOSIS — K900 Celiac disease: Secondary | ICD-10-CM | POA: Diagnosis not present

## 2022-02-04 DIAGNOSIS — I4892 Unspecified atrial flutter: Secondary | ICD-10-CM | POA: Diagnosis not present

## 2022-02-04 DIAGNOSIS — I4819 Other persistent atrial fibrillation: Secondary | ICD-10-CM | POA: Insufficient documentation

## 2022-02-04 DIAGNOSIS — I484 Atypical atrial flutter: Secondary | ICD-10-CM

## 2022-02-04 NOTE — Telephone Encounter (Signed)
Medication list reviewed in anticipation of upcoming Tikosyn initiation. Patient is not taking any contraindicated or QTc prolonging medications.  ? ?Patient is anticoagulated on Eliquis 11m BID on the appropriate dose (qualifies for 2.534mBID for her afib given age > 808nd weight < 60kg, but rx is being prescribed by pulm for higher VTE tx dose as pt had recent PE diagnosed 10/25/21). Please ensure that patient has not missed any anticoagulation doses in the 3 weeks prior to Tikosyn initiation.  ? ?Patient will need to be counseled to avoid use of Benadryl while on Tikosyn and in the 2-3 days prior to Tikosyn initiation. ? ?

## 2022-02-04 NOTE — Progress Notes (Signed)
? ? ?Primary Care Physician: Lawerance Cruel, MD ?Primary Cardiologist: Dr [name] ?Primary Electrophysiologist: none ?Referring Physician: Dr Johney Frame  ? ? ?Janet Nguyen is a 88 y.o. female with a history of SVT, OSA, celiac disease, HFpEF, PE, atrial flutter, atrial fibrillation who presents for follow up in the [facility].  The patient was initially diagnosed with atrial flutter remotely. She presented to the ED 12/02/21 from her PCP office with symptoms of generalized weakness and rapid heart rates. ECG showed afib with RVR. She was started on diltiazem and metoprolol for rate control. Also started on Eliquis for a CHADS2VASC score of 6. Patient underwent DCCV on 01/08/22 but was back in atrial flutter at her visit 01/29/22. Her diltiazem was increased.  ? ?On follow up today, patient reports that she continues to feel fatigued in atrial flutter. Her heart rates at home tend to be better controlled, mostly under 100 bpm. No bleeding issues on anticoagulation.  ? ?Today, she denies symptoms of palpitations, chest pain, shortness of breath, orthopnea, PND, lower extremity edema, dizziness, presyncope, syncope, snoring, daytime somnolence, bleeding, or neurologic sequela. The patient is tolerating medications without difficulties and is otherwise without complaint today.  ? ? ?Atrial Fibrillation Risk Factors: ? ?she does have symptoms or diagnosis of sleep apnea. ?she is not compliant with CPAP therapy. ?she does not have a history of rheumatic fever. ? ? ?she has a BMI of Body mass index is 21.24 kg/m?Marland KitchenMarland Kitchen ?Filed Weights  ? 02/04/22 1055  ?Weight: 51 kg  ? ? ? ?Family History  ?Problem Relation Age of Onset  ? Breast cancer Mother   ? Heart disease Father   ? Migraines Neg Hx   ? ? ? ?Atrial Fibrillation Management history: ? ?Previous antiarrhythmic drugs: none ?Previous cardioversions: 01/08/22 ?Previous ablations: none ?CHADS2VASC score: 6 ?Anticoagulation history: Eliquis ? ? ?Past  Medical History:  ?Diagnosis Date  ? Arrhythmia   ? History of SVT with documented PVC'S and  PAC'S  12/08/12 Nuc stress test normal LV EF 74%  Event Monitor  12/01/12-01/03/13  ? Atrial flutter ([place])   ? Celiac disease   ? treated by Dr. [name]  ? GERD (gastroesophageal reflux disease)   ? Hypertension   ? Intervertebral disc stenosis of neural canal of cervical region   ? Irregular heart beat 11/30/2012  ? ECHO-EF 60-65%  ? Osteoporosis   ? PMR (polymyalgia rheumatica) (HCC)   ? Dr. Marijean Bravo; pt states she was diagnosed 10-15 years ago, not treated at this time or any issues that she is aware of.  ? Scoliosis   ? Scoliosis   ? Sleep apnea 10/02/11 [facility]  ? Sleep study AHI -total sleep 10.3/hr  64.0/ hr during REM sleep.RDI 22.8/hr during total sleep 64.0/hr during REM sleep The lowest O2 sat during Non-REM and REM sleep was 86% and 88% respectively. 04/08/12 CPAP/BIPAP titration study [facility]  ? ?Past Surgical History:  ?Procedure Laterality Date  ? APPENDECTOMY    ? ruptured at age 70 and had surgery  ? CARDIAC CATHETERIZATION  01/27/06  ? CARDIOVERSION N/A 01/08/2022  ? Procedure: CARDIOVERSION;  Surgeon: Freada Bergeron, MD;  Location: [facility] ENDOSCOPY;  Service: Cardiovascular;  Laterality: N/A;  ? cataract surgery  2015  ? Dr. Herbert Deaner; March & April 2015  ? ? ?Current Outpatient Medications  ?Medication Sig Dispense Refill  ? acetaminophen (TYLENOL) 500 MG tablet Take 500 mg by mouth every 8 (eight) hours as  needed for moderate pain.    ? Aloe-Sodium Chloride (AYR SALINE NASAL GEL NA) Place 1 application. into the nose daily as needed (rhinitis).    ? ALPRAZolam (XANAX) 0.5 MG tablet Take 0.25-0.5 mg by mouth 2 (two) times daily as needed for anxiety.    ? apixaban (ELIQUIS) 5 MG TABS tablet Take 1 tablet (5 mg total) by mouth 2 (two) times daily. 60 tablet 5  ? Biotin w/ Vitamins C & E (HAIR SKIN & NAILS GUMMIES PO) Take 1 tablet by mouth daily.    ? Calcium  Carb-Ergocalciferol (CHEWABLE CALCIUM/D PO) Take 1 each by mouth daily.    ? diltiazem (CARDIZEM CD) 180 MG 24 hr capsule Take 1 capsule (180 mg total) by mouth every morning. 30 capsule 6  ? fluticasone (FLONASE) 50 MCG/ACT nasal spray Place 1 spray into both nostrils daily as needed for allergies or rhinitis.    ? furosemide (LASIX) 20 MG tablet Take 1 tablet (20 mg total) by mouth daily. 90 tablet 3  ? metoprolol succinate (TOPROL-XL) 50 MG 24 hr tablet Take 1 tablet (50 mg total) by mouth 2 (two) times daily. Take with or immediately following a meal. 180 tablet 3  ? Omega-3 Fatty Acids ([employer] OMEGA-3 GUMMY FISH PO) Take 1 each by mouth daily.    ? Phenylephrine-APAP-guaiFENesin (EQ SINUS CONGESTION & PAIN PO) Take 1 tablet by mouth daily as needed (for sinus pain). Contains acetaminophen 325 mg and Phenylephrine 5 mg    ? polyethylene glycol powder (GLYCOLAX/MIRALAX) 17 GM/SCOOP powder Take 17 g by mouth daily as needed for moderate constipation.    ? Tiotropium Bromide Monohydrate (SPIRIVA RESPIMAT) 1.25 MCG/ACT AERS Inhale 2 puffs into the lungs daily. 4 g 0  ? traMADol (ULTRAM) 50 MG tablet Take 50 mg by mouth 2 (two) times daily as needed for severe pain.    ? zolpidem (AMBIEN) 5 MG tablet Take 2.5 mg by mouth at bedtime.    ? potassium chloride (KLOR-CON M) 10 MEQ tablet Take 1 tablet (10 mEq total) by mouth daily. 90 tablet 3  ? ?No current facility-administered medications for this encounter.  ? ? ?Allergies  ?Allergen Reactions  ? Amoxicillin-Pot Clavulanate   ?  Has never had, son allergic  ?Other reaction(s): Vomiting  ? Gluten Meal Other (See Comments)  ?  Unknown  ? Naproxen Other (See Comments)  ?  Stomach upset  ? ? ?Social History  ? ?Socioeconomic History  ? Marital status: Divorced  ?  Spouse name: Not on file  ? Number of children: 4  ? Years of education: college   ? Highest education level: Not on file  ?Occupational History  ? Occupation: retired   ?Tobacco Use  ? Smoking status: Never  ?  Smokeless tobacco: Never  ? Tobacco comments:  ?  Never smoke 12/12/21  ?Vaping Use  ? Vaping Use: Never used  ?Substance and Sexual Activity  ? Alcohol use: Yes  ?  Alcohol/week: 3.0 - 4.0 standard drinks  ?  Types: 3 - 4 Glasses of wine per week  ?  Comment: 1 glass of wine 3-4 times weekly 12/12/21  ? Drug use: No  ? Sexual activity: Not on file  ?Other Topics Concern  ? Not on file  ?Social History Narrative  ? Lives alone at home  ? Drinks tea but tries to drink decaf  ? Has 9 grandchildren   ? Right handed  ? ?Social Determinants of Health  ? ?Financial Resource Strain:  Low Risk   ? Difficulty of Paying Living Expenses: Not hard at all  ?Food Insecurity: No Food Insecurity  ? Worried About Charity fundraiser in the Last Year: Never true  ? Ran Out of Food in the Last Year: Never true  ?Transportation Needs: No Transportation Needs  ? Lack of Transportation (Medical): No  ? Lack of Transportation (Non-Medical): No  ?Physical Activity: Not on file  ?Stress: Not on file  ?Social Connections: Not on file  ?Intimate Partner Violence: Not on file  ? ? ? ?ROS- All systems are reviewed and negative except as per the HPI above. ? ?Physical Exam: ?Vitals:  ? 02/04/22 1055  ?BP: 118/78  ?Pulse: (!) 125  ?Weight: 51 kg  ?Height: 5' 1"  (1.549 m)  ? ? ?GEN- The patient is a well appearing elderly female, alert and oriented x 3 today.   ?HEENT-head normocephalic, atraumatic, sclera clear, conjunctiva pink, hearing intact, trachea midline. ?Lungs- Clear to ausculation bilaterally, normal work of breathing ?Heart- irregular rate and rhythm, no murmurs, rubs or gallops  ?GI- soft, NT, ND, + BS ?Extremities- no clubbing, cyanosis, or edema ?MS- no significant deformity or atrophy ?Skin- no rash or lesion ?Psych- euthymic mood, full affect ?Neuro- strength and sensation are intact ? ? ?Wt Readings from Last 3 Encounters:  ?02/04/22 51 kg  ?01/29/22 53 kg  ?01/24/22 51.8 kg  ? ? ?EKG today demonstrates  ?Atrial flutter with  variable block ?Vent. rate 125 BPM ?PR interval * ms ?QRS duration 92 ms ?QT/QTcB 308/444 ms ? ?Echo 12/03/21 demonstrated  ? 1. Left ventricular ejection fraction, by estimation, is 60 to 65%. Left  ?ventricular ejecti

## 2022-02-05 ENCOUNTER — Ambulatory Visit (HOSPITAL_COMMUNITY): Payer: Medicare Other | Admitting: Physician Assistant

## 2022-02-05 ENCOUNTER — Telehealth (HOSPITAL_COMMUNITY): Payer: Self-pay | Admitting: *Deleted

## 2022-02-05 MED ORDER — METOPROLOL SUCCINATE ER 50 MG PO TB24: 75.0000 mg | ORAL_TABLET | Freq: Two times a day (BID) | ORAL | 3 refills | Status: DC

## 2022-02-05 MED ORDER — DILTIAZEM HCL ER COATED BEADS 120 MG PO CP24: 120.0000 mg | ORAL_CAPSULE | Freq: Every morning | ORAL | Status: DC

## 2022-02-05 MED ORDER — CILIDINIUM-CHLORDIAZEPOXIDE 2.5-5 MG PO CAPS: 1.0000 | ORAL_CAPSULE | Freq: Every day | ORAL | 3 refills | Status: DC | PRN

## 2022-02-05 NOTE — Telephone Encounter (Signed)
Pt having issues with diarrhea since increasing diltiazem to 16m -- per RAdline PealsPA will decrease back to 1267mdaily. Increase metoprolol to 7575mID. Pt will notify if symptoms do not improve.  ?

## 2022-02-06 NOTE — Telephone Encounter (Signed)
Forms have been placed to be faxed. ?

## 2022-02-06 NOTE — Telephone Encounter (Signed)
Done and given to Raquel Sarna ?

## 2022-02-06 NOTE — Telephone Encounter (Signed)
Form has been received and has been placed with Dr. Chase Caller for him to fill out. Routing this to MR. ?

## 2022-02-07 ENCOUNTER — Other Ambulatory Visit (HOSPITAL_COMMUNITY): Payer: Self-pay

## 2022-02-07 MED ORDER — APIXABAN 5 MG PO TABS: 5.0000 mg | ORAL_TABLET | Freq: Two times a day (BID) | ORAL | 0 refills | Status: DC

## 2022-02-10 ENCOUNTER — Other Ambulatory Visit (HOSPITAL_COMMUNITY): Payer: Self-pay

## 2022-02-10 ENCOUNTER — Telehealth (HOSPITAL_COMMUNITY): Payer: Self-pay | Admitting: *Deleted

## 2022-02-10 NOTE — Telephone Encounter (Signed)
Patient would like to speak with Dr. [name] before committing to AFib clinic recommendation of Dofetilide admission. Patient wants confirmation from Dr. [name] that Dofetilide is the best option for her. Pt informed admission for now is put on hold and I will forward to Dr. [name] and his RN - they will reach out sometime this week for further discussion. Pt best reached at [phone]. ?

## 2022-02-11 ENCOUNTER — Ambulatory Visit (HOSPITAL_COMMUNITY): Payer: Medicare Other | Admitting: Physician Assistant

## 2022-02-11 ENCOUNTER — Observation Stay: Admission: AD | Admit: 2022-02-11 | Payer: Medicare Other | Source: Ambulatory Visit | Admitting: Cardiology

## 2022-02-12 NOTE — Telephone Encounter (Signed)
Spoke to patient . She states  she is not in favor of going to the hosptial for 3 days to statr a new medication . She states she really would like to speak to Dr [name]   about options. ?  RN  did  give patient the  information Dr [name] commented. ? ? Patient states  she  is having issues with the cost of the medication she is taking now -  she states Eliquis is is costing her $300. She states she paid one month but will not continue with the cost. ? She states  she has another  medication which cost $100. ? ?   Patient states she has called Dr Chase Caller office  but has not been able to speak to someone. She states he initial started the medication. ? ?RN offer patient a patient assistance for Eliquis . She states tried earlier in the year but was denied. She also states  her [employer] insurance denied some type medication ( not sure what )  ? ? Patient would again like to speak to Dr [name] . ?

## 2022-02-12 NOTE — Telephone Encounter (Signed)
Pt is not a candidate for Amiodarone with her lung disease. Agree that Janet Nguyen may be best option presently. Recommend that she keep F/U appt in AF clinic for consideration of admission for initiation.  ?

## 2022-02-14 DIAGNOSIS — Z86711 Personal history of pulmonary embolism: Secondary | ICD-10-CM | POA: Diagnosis not present

## 2022-02-14 DIAGNOSIS — I4891 Unspecified atrial fibrillation: Secondary | ICD-10-CM | POA: Diagnosis not present

## 2022-02-14 DIAGNOSIS — F419 Anxiety disorder, unspecified: Secondary | ICD-10-CM | POA: Diagnosis not present

## 2022-02-15 ENCOUNTER — Other Ambulatory Visit: Payer: Self-pay | Admitting: Cardiovascular Disease

## 2022-02-17 ENCOUNTER — Other Ambulatory Visit (HOSPITAL_COMMUNITY): Payer: Self-pay

## 2022-02-17 ENCOUNTER — Telehealth: Payer: Self-pay | Admitting: Internal Medicine

## 2022-02-17 MED ORDER — SPIRIVA RESPIMAT 1.25 MCG/ACT IN AERS: 2.0000 | INHALATION_SPRAY | Freq: Every day | RESPIRATORY_TRACT | 5 refills | Status: DC

## 2022-02-17 NOTE — Telephone Encounter (Signed)
Called and left voicemail for patient to call office back in regards to Eliquis

## 2022-02-17 NOTE — Telephone Encounter (Signed)
Patient's insurance prefers Xarelto. Routing to MR for advise and switching patient to Xarelto at this point.  Per test claim, copay for Xarelto is $47 for the month.  Left VM for patient to switch at this point. I've never seen one DOAC be preferred over another. Seems like Xarelto was discussed by Dr. Chase Caller before but patient herself was hesitant to start.   Knox Saliva, PharmD, MPH, BCPS, CPP Clinical Pharmacist (Rheumatology and Pulmonology)

## 2022-02-17 NOTE — Telephone Encounter (Signed)
Called and spoke with patient about need Spiriva refilled and to get her scheduled for OV with Dr. Chase Caller after CT. Looks like her CT is scheduled for 04/08/22 and she states that she is not aware of that and is not able to do it that day. Advised her I would have a [facility] call her to work it out. RX has been sent to pharmacy. Next Appt With Pulmonology Brand Males, MD) 04/17/2022 at 1:30 PM   [facility] please reschedule CT scan

## 2022-02-17 NOTE — Telephone Encounter (Signed)
Called and scheduled appointment for 03-03-22 she will pay cash or call Dr Lucius Conn office for samples for her Eliquis. She states that she has filled out the pt assistance forms. See Ramaswami's note dated 02-17-22 for this.

## 2022-02-17 NOTE — Telephone Encounter (Signed)
Returned call to pt she states that she does not want to do the Tikosyn and get admitted to the hospital to take this she would like to have him call her. She states that she has filled out the pt assistance for the eliquis and dropped it off at [employer] office. She states that she has a few pills left and she states s the rx costs $300 and she will not fill this. She would like Dr [name] to call her to discuss the next steps.

## 2022-02-17 NOTE — Telephone Encounter (Signed)
Pt walked in but she was not able to wait. Tried to call pt on cell phone, left message to call back. Is this about her starting Tikosyn?

## 2022-02-17 NOTE — Telephone Encounter (Signed)
Spoke with patient. She states that she still has some Eliquis at home unsure of how much. She states that she does not ideally want to switch to Xarelto. I did review that it seems to be the more cost-effective option for her. She states that she is only staying on DOAC for 6 months and if she has enough to get through June she would not like to switch anticoagulants.  Transferred to front desk to schedule f/u appt with Dr. Chase Caller as well  She is also requesting refill for Spiriva. She states she was provided with samples last time but didn't know how she would go about getting more of medication.   Patient expresses feeling overwhelmed by all of her medications and keeping up with them. She expresses some level of defeat with trying to get in contact with doctor's offices due to long wait times. I advised that it is impossible for our clinic to know if she needs a prescription until she or the pharmacy notifies [place]. I did advise that she is welcome to send request via MyChart as another option for communication if she is unable to wait on the phone.  Knox Saliva, PharmD, MPH, BCPS, CPP Clinical Pharmacist (Rheumatology and Pulmonology)

## 2022-02-19 ENCOUNTER — Encounter: Payer: Self-pay | Admitting: Acute Care

## 2022-02-19 NOTE — Telephone Encounter (Signed)
Patient aware of appt info resc ct for her

## 2022-03-03 ENCOUNTER — Encounter: Payer: Self-pay | Admitting: Cardiovascular Disease

## 2022-03-03 ENCOUNTER — Ambulatory Visit (INDEPENDENT_AMBULATORY_CARE_PROVIDER_SITE_OTHER): Payer: Medicare Other | Admitting: Cardiovascular Disease

## 2022-03-03 VITALS — BP 100/80 | HR 97 | Resp 82 | Ht 61.0 in | Wt 121.4 lb

## 2022-03-03 DIAGNOSIS — M4125 Other idiopathic scoliosis, thoracolumbar region: Secondary | ICD-10-CM | POA: Diagnosis not present

## 2022-03-03 DIAGNOSIS — I2699 Other pulmonary embolism without acute cor pulmonale: Secondary | ICD-10-CM

## 2022-03-03 DIAGNOSIS — I471 Supraventricular tachycardia, unspecified: Secondary | ICD-10-CM

## 2022-03-03 DIAGNOSIS — Z7901 Long term (current) use of anticoagulants: Secondary | ICD-10-CM

## 2022-03-03 DIAGNOSIS — R918 Other nonspecific abnormal finding of lung field: Secondary | ICD-10-CM | POA: Diagnosis not present

## 2022-03-03 DIAGNOSIS — I10 Essential (primary) hypertension: Secondary | ICD-10-CM

## 2022-03-03 DIAGNOSIS — I4819 Other persistent atrial fibrillation: Secondary | ICD-10-CM | POA: Diagnosis not present

## 2022-03-03 DIAGNOSIS — J479 Bronchiectasis, uncomplicated: Secondary | ICD-10-CM | POA: Diagnosis not present

## 2022-03-03 MED ORDER — DIGOXIN 125 MCG PO TABS: 0.0625 mg | ORAL_TABLET | Freq: Every day | ORAL | 3 refills | Status: DC

## 2022-03-03 NOTE — Patient Instructions (Addendum)
Medication Instructions:  START digoxin 0.0625 mg (1/2 tablet) daily  *If you need a refill on your cardiac medications before your next appointment, please call your pharmacy*  Follow-Up: At [facility], you and your health needs are our priority.  As part of our continuing mission to provide you with exceptional heart care, we have created designated Provider Care Teams.  These Care Teams include your primary Cardiologist (physician) and Advanced Practice Providers (APPs -  Physician Assistants and Nurse Practitioners) who all work together to provide you with the care you need, when you need it.  We recommend signing up for the patient portal called "MyChart".  Sign up information is provided on this After Visit Summary.  MyChart is used to connect with patients for Virtual Visits (Telemedicine).  Patients are able to view lab/test results, encounter notes, upcoming appointments, etc.  Non-urgent messages can be sent to your provider as well.   To learn more about what you can do with MyChart, go to [email or web address].    Your next appointment:   2 month(s)  The format for your next appointment:   In Person  Provider:   Shelva Majestic, MD {   Important Information About Sugar

## 2022-03-03 NOTE — Progress Notes (Signed)
50,000 patient ID: Janet Nguyen, female   DOB: 1931-10-18, 88 y.o.   MRN: [id number]     Primary  M.D.: Dr. Mardene Sayer  HPI: Janet Nguyen is a 88 y.o. female who presents for a one month followup cardiology evaluation.  Janet Nguyen has a history of documented SVT and also has PACs and PVCs  treated with beta blocker therapy. In April 2014 I further titrated her beta blocker therapy after cardiac event monitor revealed several bursts of recurrent SVT up to 177 beats per minute in March.  In July after she had had 2 episodes of chest fluttering which each lasted over 30 minutes which he did take metoprolol tartrate with relief I recommended further titration of her Toprol to 75 mg in the morning and 50 mg at night and if necessary she could further titrate this to 75 twice a day.  She has a history of obstructive sleep apnea but despite multiple attempts at CPAP utilization she has not been able to tolerate this. She was referred to Dr. Alanson Nguyen and has a customized dental appliance with mandibular advancement with improvement in some of her symptomatology. Due concern that her teeth may be moving in more recently she has has not been using customized appliance daily but has been using her old non-customized mouthguard.  At times shen has awakened abruptly from a dream with her heart pounding and a sensation of hot flashes, gasping for breath.   She also states that her scoliosis is getting worse. She does have left-sided musculoskeletal type chest pain due to her spine angulation.  Beause of her significant scoliosis, she feels she must sleep on her back.   She has a history of GERD for which she has been taking Nexium.  She presents for evaluation.  I had scheduled her for an overnight oximetry to see if she is a candidate for supplemental oxygen at nighttime since she refused to use CPAP and only very rarely uses her customized mouthpiece.  She does wear a mouthguard to reduce  bruxism.  Oximetry study was performed overnight on February 23/24.  Her mean oxygen saturation was 93.12%.  She spent 12 minutes and 16 seconds with O2 sat duration below 88% with the lowest O2 sat duration of 80%. I tried to set her up for supplemental oxygen at bedtime.  However, she has been denied for this on multiple occasions by her insurance/Medicare.  She feels that she is sleeping better.  She can only sleep in her back due to scoliosis.  Her left side is concave; her right side is convex.  She has had issues with labile blood pressure. She has had issues with lower back discomfort and sciatica leading to emergency room evaluation in November 2016.  She saw Dr. Rita Nguyen for neurosurgical evaluation. She has migraine headaches. She  Recently saw Dr. Lennie Nguyen for these migraine headaches.  She has been on Toprol-XL 75 mg twice a day for her palpitations and presently denies any awareness an extra heartbeats. She takes Xanax on an as-needed basis for anxiety.  She has been taking Nexium 20, no grams every other day for GERD. She states her scoliosis is getting worse.  Has been experiencing more fatigue.  She also notes some occasional left hand numbness.  In a mouth guard but no ureteral longer uses her mandibular advancement device and not tolerate CPAP therapy for her obstructive sleep apnea.    She had experienced left-sided chest discomfort which most likely was  related to her significant scoliosis the potential neuropathy causing intermittent left arm and hand numbness.  I slightly reduced her Toprol-XL which she had been taking 150 mg daily and a 75 twice a day regimen to 75 and milligrams in the morning and 50 mg with ultimate plan to decrease this to 50 twice a day.  She states when she reduce this, she began to notice more optical migraines and resume taking the higher dose Toprol at 75 twice a day with improvement.  She has seen Dr. Lennie Nguyen for her paresthesias.  She admits to significant anxiety.   She had requested zolpidem for sleep initiation and maintenance.  In the past, we had tried 6.25 slow-release version to help with sleep maintenance, but due to cost issues preferred the 5 mg sleep initiation dose.  She experiences optical migraine headaches.  She was being seen by neurologist, Dr. Melton Nguyen  She continues to have issues with her scoliosis causing discomfort in her neck and arm due to her distortion.  He tells me she underwent endoscopy and colonoscopy as well as banding of hemorrhoids.  She had a benign polyp removed.  She has issues with spastic colon.  Her sleep is better with zolpiden 5 mg.  She saw Janet Nguyen on 11/13/2016.  She had noticed that her hair was "thinning" and was concerned about this being the result of Toprol.  He suggested possibly decreasing Toprol but she preferred not until she had seen me.  Since that time, she continues to experience some optical migraines.  She has noticed some leg left neck discomfort.  She denies any patches of hair loss.  She denies differential arm weakness.  She continues to have difficulty with her scoliosis with hip pain and rib cage discomfort.   When I saw her in March 2018, there was a blood pressure differential of 118/78 in the left arm and 140/80 in the right arm.  She subsequently underwent upper extremity Doppler evaluation on 12/25/2016.  She was noted have mild heterogeneous plaque with stable 1-39% bilateral internal carotid stenoses.  She had normal subclavian arteries bilaterally.  There are patent vertebral arteries with antegrade flow.  She did not have any significant blood pressure differential and had triphasic waveforms in both her right and biphasic in her left brachial artery.  Left blood pressure was 8 mm higher than the right brachial pressure.  At times, she continues to experience some vague chest wall symptoms, which most likely related to her posture.  She denies significant shortness of breath.  She has to sleep on  her back because of her scoliosis.  Uses a chin strap to prevent oral breathing.  She is not on CPAP.   In June 2019 she had concerns about some of her medications possibly causing hair loss or memory loss.  She admits to having dry eyes.  She continues to have difficulty from her scolWsaw her in November 2019 at which time she,  felt well from a cardiac standphk and apparently had a new oral appliance made.  She used this initially but then started to notice some teeth discomfort.  Apparently this again was treated by Dr.e still admits to being tired.  She has issues with her abdomen being tight which she believes is related to her scoliosis.  Her GERD is controlled with pantoprazole.    She was seen by Dr. Mardene Sayer for her primary care.  She had developed a fungal infection of her toenail.  He had given  her to [place] fine 20 mg.  She has not started this and was hesitant to do this due to potential interaction with metoprolol.  She also was evaluated by Dr. Brett Fairy.  According to Janet Nguyen she did not evaluate her ophthalmic migraines.  However she was planning to do a possible home study to reassess her sleep apnea.  In the past she did not tolerate any CPAP therapy and although initially tolerated customized oral appliance this seemed to cause difficulty with movement of her teeth.  She has been using the CALM app on her smart phone to help with relaxation and improve sleep and is chronically dependent on low-dose zolpidem prescribed by Dr. Harrington Challenger.  She denies chest pain.  Her palpitations are well controlled with her current dose of metoprolol 75 mils twice a day and she can to be on low-dose amlodipine 2.5 mg.    Since I saw her in June 2020, she underwent the home study by Dr. Theodoro Clock was done after several studies with no data.  According to Dr. Edwena Felty note, AHI was 26.5 which was moderate and during REM sleep AHI rose to 34.9.  Snoring was not recorded.  Apparently there was discussion of  possible Financial planner.  Ms. Milhouse is not interested.  I saw her in October 2020 at which time she had continued issues with migraines and some vertigo for which he takes the Epley maneuver.  She continued to have discomfort related to her scoliosis with muscle discomfort on her chest.  She believes she is waking up during dreaming and not sleeping well.  Her blood pressure has increased.  At her prior evaluation, I had instituted low-dose amlodipine at 2.5 mg.  With her blood pressure elevated during that evaluation I further titrated this to 5 mg I recommended she continue taking Toprol XL 75 mg twice a day.  She did not have any episodes of palpitations or recurrent SVT.  I saw her in January 2021. She had seen  Dr. Jaynee Eagles of neurology.  She continued to experience some muscular neck aches which may be related to her scoliosis but do not sound ischemic in etiology.  She states she has some varicose veins in the right lower extremity and complains of trivial ankle swelling.  She denies palpitations.    I saw her in April 2021.  Over the previous several months she had felt well from a cardiovascular standpoint.  She continues to have issues with her scoliosis causing back issues as well as some left-sided discomfort along her rib cage.   She has sleep apnea and remotely did not tolerate CPAP or an oral appliance  She continues to be on amlodipine 5 mg daily, metoprolol succinate 75 mg twice a day both for blood pressure and her palpitations. GERD is fairly well controlled on pantoprazole. She continues to be on Librax which she states does offer improvement and she takes alprazolam on a as needed basis.  I saw her in October 2021 and since her prior evaluation she had undergone neurology evaluation by Dr. Tomi Likens and has had migraine issues with aura since young adulthood.  An MRI of the brain in 2017 showed mild chronic small vessel ischemic changes otherwise was unremarkable.  She had developed  swelling in her leg right greater than left and has varicose veins.  She underwent lower extremity venous study which revealed no DVT but there was evidence for venous reflux in the right saphenofemoral junction, right greater saphenous vein in the thigh  and right greater saphenous vein in the calf.  She has used compression stockings in the past.  There was no plan for intervention.  She has been undergoing physical therapy.  There is no change in her intermittent rib discomfort from her scoliosis.  She denies palpitations.    I saw her in May 2022.  Over the prior 6 months she continued to have issues with her rib cage and hip bone discomfort contributed by her scoliosis.  She has had issues with varicose veins.  She has to sleep on her side.  She was recently evaluated by Dr. Earlie Server for elevated monocytes who felt she was stable hematologically.  She denies any angina.  She is unaware of palpitations and continues to be on Toprol all succinate 75 mg twice a day.  She is also on amlodipine 5 mg for hypertension.  She has a prescription for alprazolam and rarely if ever takes this for anxiety.    When I saw her on October 10, 2021 she was continuing to have issues resulting from her scoliosis.  She was recently evaluated at [facility] and a chest CT revealed scattered areas of focal bronchiectasis and clustered nodules in the lower lungs, most pronounced in the right middle lobe and lingula.  There also was a part solid nodule of the right lower lobe measuring 7.5 mm in mean diameter with 4 mm solid component.  A follow-up noncontrast CT was recommended in 3 to 6 months.  She was experiencing increasing shortness of breath with activity and denied any anginal symptoms.  Since I saw her, she was evaluated by Dr. Chase Caller in follow-up of her chest CT for pulmonary evaluation of her bronchiectasis and shortness of breath.  Laboratory revealed an elevated D-dimer.  Subsequent VQ scan on October 25, 2021 was positive for PE.  A 2D echo Doppler study showed mildly elevated pulmonary artery pressure 38 mm with grade 2 diastolic dysfunction, severe left atrial enlargement with moderate right atrial enlargement, and moderately severe MR.  Venous Doppler was negative for DVT.  She was started on Eliquis and Spiriva.  She had pulmonary follow-up on February 3 and was last seen by Dr. Chase Caller on November 28, 2021.  She was hospitalized on March 6 through December 04, 2021 with increased palpitations and was found to be in atrial fibrillation.  On presentation sodium was 126 potassium 5.2.  Troponins were negative.  Chest x-ray showed cardiomegaly with bilateral atelectasis and small bilateral pleural effusions.  She was started on IV diuresis.  She was on beta-blocker with metoprolol succinate 75 mg and diltiazem was added for rate control.  Follow-up echo showed EF 60 to 65% with moderate dilation of bilateral atriums.  There is a small circumferential pericardial effusion.  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  During hospitalization she was seen by Dr. Johney Frame of cardiology.  She was discharged on December 04, 2021.  I saw her in follow-up of her hospitalization on December 18, 2021.  At that time, she felt somewhat improved.  However, her ECG revealed atrial fibrillation at 94 bpm.  She was less tachycardic.  She continues to have issues with her scoliosis.  She is on medical regimen of Eliquis 5 mg twice a day, metoprolol succinate 75 mg daily, diltiazem CD1 120 mg daily, furosemide 20 mg, as well as Spiriva.  During that evaluation, I recommended  further titration of her metoprolol succinate to 100 mg daily and that she take this in a 50 twice daily regimen for more stable blood pressure.  She continues to be on Eliquis for anticoagulation.  I schedule her for outpatient cardioversion.  Janet Nguyen  underwent DC cardioversion by Dr. Johney Frame on January 08, 2022.  She was successfully cardioverted.  Subsequently, she saw Dr. Chase Caller on January 24, 2022.  She apparently was to be on Spiriva and the flutter valve but apparently was not doing this.  I saw her for follow-up on Jan 29, 2022.  At that time she stated her blood pressure had been typically in the 628-366 systolic range.  She had noticed heart rate irregularity for the last 1 to 2 weeks.  Her ECG revealed that she is back in atrial fibrillation with coarse fibrillatory waves with ventricular rate at 97.  At that time I discussed options with the patient.  With her age of 88 years old I recommended further titration of diltiazem to 180 mg daily from her present dose of 120 mg.  I did not feel she was a candidate for amiodarone in light of her recent pulmonary history and recommended she be evaluated in the atrial fibrillation clinic for consideration of possible initiation of other antiarrhythmic treatment.  She was scheduled to have a follow-up chest CT in the future with Dr. Chase Caller.  Since I last saw her, she was evaluated by Malka So on Feb 04, 2022.  At that time there was significant discussion concerning possible admission for dofetilide and reportedly she was agreeable.  He recommended that she continue diltiazem 180 mg daily, Toprol 50 mg twice a day, and Eliquis 5 mg twice a day.  Apparently, subsequently Janet Nguyen had real concerns about the hospitalization and canceled the tentatively scheduled admission.  She now presents to see me for follow-up Cardiologic evaluation.  Apparently she reduced her diltiazem back to the 26th she felt 180 mg dose was causing her stomach to be upset.  She admits to being under a lot of stress trying to figure out what is the best approach for her to take.  She presents for evaluation.   Past Medical History:  Diagnosis Date   Arrhythmia    History of SVT with documented PVC'S and  PAC'S  12/08/12 Nuc  stress test normal LV EF 74%  Event Monitor  12/01/12-01/03/13   Atrial flutter ([place])    Celiac disease    treated by Dr. 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 Marijean Bravo; pt states she was diagnosed 10-15 years ago, not treated at this time or any issues that she is aware of.   Scoliosis    Scoliosis    Sleep apnea 10/02/11 [facility]   Sleep study AHI -total sleep 10.3/hr  64.0/ hr during REM sleep.RDI 22.8/hr during total sleep 64.0/hr during REM sleep The lowest O2 sat during Non-REM and REM sleep was 86% and 88% respectively. 04/08/12 CPAP/BIPAP titration study [facility]    Past Surgical History:  Procedure Laterality Date   APPENDECTOMY     ruptured at age 69 and had surgery   CARDIAC CATHETERIZATION  01/27/06   CARDIOVERSION N/A 01/08/2022   Procedure: CARDIOVERSION;  Surgeon:  Freada Bergeron, MD;  Location: [facility] ENDOSCOPY;  Service: Cardiovascular;  Laterality: N/A;   cataract surgery  2015   Dr. Herbert Deaner; March & April 2015    Allergies  Allergen Reactions   Amoxicillin-Pot Clavulanate     Has never had, son allergic  Other reaction(s): Vomiting   Gluten Meal Other (See Comments)    Unknown   Naproxen Other (See Comments)    Stomach upset    Current Outpatient Medications  Medication Sig Dispense Refill   acetaminophen (TYLENOL) 500 MG tablet Take 500 mg by mouth every 8 (eight) hours as needed for moderate pain.     Aloe-Sodium Chloride (AYR SALINE NASAL GEL NA) Place 1 application. into the nose daily as needed (rhinitis).     ALPRAZolam (XANAX) 0.5 MG tablet Take 0.25-0.5 mg by mouth 2 (two) times daily as needed for anxiety.     apixaban (ELIQUIS) 5 MG TABS tablet Take 1 tablet (5 mg total) by mouth 2 (two) times daily. 56  tablet 0   Biotin w/ Vitamins C & E (HAIR SKIN & NAILS GUMMIES PO) Take 1 tablet by mouth daily.     Calcium Carb-Ergocalciferol (CHEWABLE CALCIUM/D PO) Take 1 each by mouth daily.     clidinium-chlordiazePOXIDE (LIBRAX) 5-2.5 MG capsule Take 1 capsule by mouth daily as needed. 60 capsule 3   digoxin (LANOXIN) 0.125 MG tablet Take 0.5 tablets (0.0625 mg total) by mouth daily. 15 tablet 3   diltiazem (CARDIZEM CD) 120 MG 24 hr capsule Take 1 capsule (120 mg total) by mouth every morning.     fluticasone (FLONASE) 50 MCG/ACT nasal spray Place 1 spray into both nostrils daily as needed for allergies or rhinitis.     Omega-3 Fatty Acids ([employer] OMEGA-3 GUMMY FISH PO) Take 1 each by mouth daily.     Phenylephrine-APAP-guaiFENesin (EQ SINUS CONGESTION & PAIN PO) Take 1 tablet by mouth daily as needed (for sinus pain). Contains acetaminophen 325 mg and Phenylephrine 5 mg     polyethylene glycol powder (GLYCOLAX/MIRALAX) 17 GM/SCOOP powder Take 17 g by mouth daily as needed for moderate constipation.     Tiotropium Bromide Monohydrate (SPIRIVA RESPIMAT) 1.25 MCG/ACT AERS Inhale 2 puffs into the lungs daily. 4 g 5   traMADol (ULTRAM) 50 MG tablet Take 50 mg by mouth 2 (two) times daily as needed for severe pain.     zolpidem (AMBIEN) 5 MG tablet Take 2.5 mg by mouth at bedtime.     furosemide (LASIX) 20 MG tablet Take 1 tablet (20 mg total) by mouth daily. 90 tablet 3   metoprolol succinate (TOPROL-XL) 50 MG 24 hr tablet TAKE 1 AND 1/2 TABLETS BY MOUTH TWO TIMES A DAY 270 tablet 0   potassium chloride (KLOR-CON M) 10 MEQ tablet Take 1 tablet (10 mEq total) by mouth daily. 90 tablet 3   No current facility-administered medications for this visit.    Socially she is divorced has 4 children 9 grandchildren. She does exercise. No tobacco use. She does occasional wine.  ROS General: Negative; No fevers, chills, or night sweats;  HEENT: Negative; No changes in vision or hearing, sinus congestion, difficulty  swallowing Pulmonary: Recent unprovoked PE, bronchiectasis Cardiovascular: See HPI GI: Negative; No nausea, vomiting, diarrhea, or abdominal pain GU: Negative; No dysuria, hematuria, or difficulty voiding Musculoskeletal: Positive for significant scoliosis; fibromyalgia;  joint pain, or weakness Hematologic/Oncology: Negative; no easy bruising, bleeding Endocrine: Negative; no heat/cold intolerance; no diabetes Neuro: History of migraine headaches Skin: Positive for fungal  infection of her toenail Psychiatric: Negative; No behavioral problems, depression Sleep: Positive for sleep apnea ; No snoring, daytime sleepiness, hypersomnolence, bruxism, restless legs, hypnogognic hallucinations, no cataplexy Other comprehensive 14 point system review is negative.  PE BP 100/80 (BP Location: Left Arm)   Pulse 97   Resp (!) 82   Ht 5' 1"  (1.549 m)   Wt 121 lb 6.4 oz (55.1 kg)   BMI 22.94 kg/m    Repeat blood pressure by me was 114/76.  Wt Readings from Last 3 Encounters:  03/03/22 121 lb 6.4 oz (55.1 kg)  02/04/22 112 lb 6.4 oz (51 kg)  01/29/22 116 lb 12.8 oz (53 kg)   General: Alert, oriented, no distress.  Skin: normal turgor, no rashes, warm and dry HEENT: Normocephalic, atraumatic. Pupils equal round and reactive to light; sclera anicteric; extraocular muscles intact; Nose without nasal septal hypertrophy Mouth/Parynx benign; Mallinpatti scale 2 Neck: No JVD, no carotid bruits; normal carotid upstroke Lungs: No audible wheezing.  Slightly decreased breath sounds at bases  Chest wall: without tenderness to palpitation Heart: PMI not displaced, irregularly irregular rhythm with ventricular rate in the upper 90s., s1 s2 normal, 1/6 systolic murmur, no diastolic murmur, no rubs, gallops, thrills, or heaves Abdomen: soft, nontender; no hepatosplenomehaly, BS+; abdominal aorta nontender and not dilated by palpation. Back: no CVA tenderness; significant scoliosis Pulses  2+ Musculoskeletal: full range of motion, normal strength, no joint deformities Extremities: no clubbing cyanosis or edema, Homan's sign negative  Neurologic: grossly nonfocal; Cranial nerves grossly wnl Psychologic: Normal mood and affect    March 03, 2022 ECG (independently read by me): Atrial fibrillation at 97, QS V1-3  Jan 29, 2022 ECG (independently read by me): Probable A fib with coarse fibrilatory waves at 97 bpm vs A flutter with variable block; QTc 441  December 19, 2021 ECG (independently read by me):  Atrial fibrillation at 94, LAHB    October 10, 2021 ECG (independently read by me): Sinus bradycardia at 59,   Feb 13, 2021 ECG (independently read by me): Sinus bradycardia at 59 with mild arhythmia  October 2021 ECG (independently read by me): Sinus rhythm at 67 with mild arrythmia; norma intervals  April 2021 ECG (independently read by me): Normal sinus rhythm at 69 bpm. No ectopy. Normal intervals.  October 18, 2019 ECG (independently read by me): Sinus bradycardia 59 bpm.  QS complex V1 V2.  Normal intervals.  No ectopy  October 2020 ECG (independently read by me): Normal sinus rhythm at 63 bpm.  QS complex V1 V2.  No ectopy.  Normal intervals.  June 2020 ECG (independently read by me): NSR at 62; no ectopy; normal intervals   November 2019 ECG (independently read by me): Normal sinus rhythm with PAC.  Ventricular rate 66.  QS V1 V2.  Mild T wave abnormality  June 2019 ECG (independently read by me): Normal sinus rhythm at 64 bpm.  Normal intervals.  No ectopy.  September 2018 ECG (independently read by me): Normal sinus rhythm at 64 bpm.  Isolated PAC.  QTc interval 414 ms.  QS V1 V2, unchanged.  May 2018 ECG (independently read by me): Normal sinus rhythm at 65 bpm.  QS V1, V2.  Normal intervals.  No ST segment changes.  March 2018 ECG (independently read by me): Normal sinus rhythm at 65 bpm.  No ectopy.  Normal intervals.  September 2017 ECG (independently read by  me): Normal sinus rhythm at 62 bpm.  QS V1 and V2.  Normal intervals.  April 2017 ECG (independently read by me): Normal sinus rhythm at 70 bpm.  No ectopy.  PR interval 158 ms and QTc interval 414 ms.  March 2017 ECG (independently read by me): normal sinus rhythm at 61 bpm..  No ST segment changes.  Normal intervals.  QTc interval 396 ms.  August 2014 ECG (independently read by me): Normal sinus rhythm at 63 bpm.  QRS couplets V1 V2.  No cigarette ST-T changes.  May 2016 ECG (independently read by me): Sinus bradycardia 59 bpm.  No ectopy.  February 2016 ECG (independently read by me): Sinus bradycardia 56 bpm.  Normal intervals.  Prior ECG (independently read by me): Normal sinus rhythm at 62 beats per minute.  QTc interval 411 milliseconds.  QRS complex V1, V2.  Normal intervals.  ECG (independently read by me): Normal sinus rhythm at 62 beats per minute. Normal intervals. QTc interval 399 ms.  Prior ECG of 07/13/2013: Sinus rhythm at 61 beats per minute. QTc interval 406 ms. PR interval normal at 168 ms.  LABS:    Latest Ref Rng & Units 01/29/2022    4:06 PM 01/02/2022   11:17 AM 12/04/2021    5:37 AM  BMP  Glucose 70 - 99 mg/dL 100  95  95   BUN 8 - 27 mg/dL 20  16  12    Creatinine 0.57 - 1.00 mg/dL 1.05  1.01  1.13   BUN/Creat Ratio 12 - 28 19  16     Sodium 134 - 144 mmol/L 133  129  135   Potassium 3.5 - 5.2 mmol/L 4.8  4.9  4.1   Chloride 96 - 106 mmol/L 95  91  97   CO2 20 - 29 mmol/L 23  CANCELED  28   Calcium 8.7 - 10.3 mg/dL 9.8  9.8  9.4       Latest Ref Rng & Units 01/29/2022    4:06 PM 12/02/2021    5:52 PM 02/11/2021   11:37 AM  Hepatic Function  Total Protein 6.0 - 8.5 g/dL 7.6  6.4  7.0   Albumin 3.6 - 4.6 g/dL 4.6  3.8  3.9   AST 0 - 40 IU/L 28  45  21   ALT 0 - 32 IU/L 24  17  10    Alk Phosphatase 44 - 121 IU/L 69  52  49   Total Bilirubin 0.0 - 1.2 mg/dL 0.5  1.7  0.6       Latest Ref Rng & Units 01/29/2022    4:06 PM 01/02/2022   11:17 AM 12/03/2021    5:32  AM  CBC  WBC 3.4 - 10.8 x10E3/uL 7.3  8.3  6.2   Hemoglobin 11.1 - 15.9 g/dL 13.7  13.2  12.7   Hematocrit 34.0 - 46.6 % 41.4  39.7  38.1   Platelets 150 - 450 x10E3/uL 296  258  250    Lab Results  Component Value Date   TSH 0.980 01/29/2022  Lipid Panel     Component Value Date/Time   CHOL 154 12/03/2016 0845   TRIG 81 12/03/2016 0845   HDL 67 12/03/2016 0845   CHOLHDL 2.3 12/03/2016 0845   VLDL 16 12/03/2016 0845   LDLCALC 71 12/03/2016 0845     RADIOLOGY:  CT CHEST 10/09/2021 IMPRESSION: 1. 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 Chronic aspiration could have a similar appearance. 2. Part solid nodule of the right lower lobe measuring 7.5 mm in mean diameter with 4 mm solid component. Follow-up non-contrast CT recommended at 3-6 months to confirm persistence. If unchanged, and solid component remains <6 mm, annual CT is recommended until 5 years of stability has been established. If persistent these nodules should be considered highly suspicious if the solid component of the nodule is 6 mm or greater in size and enlarging. This recommendation follows the consensus statement: Guidelines for Management of Incidental Pulmonary Nodules Detected on CT Images: From the Fleischner Society 2017; Radiology 2017; 284:228-243. 3.  Aortic Atherosclerosis (ICD10-I70.0).     ECHO: 12/03/2021  1. Left ventricular ejection fraction, by estimation, is 60 to 65%. Left  ventricular ejection fraction by 2D MOD biplane is 60.8 %. The left  ventricle has normal function. The left ventricle has no regional wall  motion abnormalities. There is mild left  ventricular hypertrophy. Left ventricular diastolic function could not be  evaluated.   2. Left atrial size was moderately dilated.    3. Right atrial size was moderately dilated.   4. The pericardial effusion is circumferential. There is no evidence of  cardiac tamponade.   5. The mitral valve is myxomatous. Moderate mitral valve regurgitation.  There is moderate late systolic prolapse of the middle scallop of the  posterior leaflet of the mitral valve.   6. Tricuspid valve regurgitation is moderate.   7. The aortic valve is tricuspid. Aortic valve sclerosis is present, with  no evidence of aortic valve stenosis.   8. There is normal pulmonary artery systolic pressure. The estimated  right ventricular systolic pressure is 70.3 mmHg.   9. The inferior vena cava is dilated in size with >50% respiratory  variability, suggesting right atrial pressure of 8 mmHg.   Comparison(s): Changes from prior study are noted. 10/15/2021: LVEF 63%,  moderate to severe MR with posterior leaflet prolapse, grade 2 DD.   IMPRESSION:  1. Persistent atrial fibrillation ([place])   2. Anticoagulated   3. Primary hypertension   4. Bronchiectasis without complication ([place])   5. Other pulmonary embolism without acute cor pulmonale, unspecified chronicity ([place])   6. PSVT (paroxysmal supraventricular tachycardia) ([facility])   7. Other idiopathic scoliosis, thoracolumbar region   8. Pulmonary nodules     ASSESSMENT AND PLAN: Janet Nguyen is an 88 year old female who has a history of SVT and PACs/PVCs which have been controlled with beta blocker therapy. On her echo in March 2014 she had normal systolic function with grade 1 diastolic dysfunction and had  significant left atrial dilatation and mild dilatation of the right ventricle with moderate dilatation of the right atrium. She had mild/moderate pulmonary hypertension with PA pressures of 42 mm. An echo in October 2018 showed an EF of 55 to 60% with grade 1 diastolic dysfunction, mitral annular calcification with mild MR, and mild pulmonary hypertension with PA pressure 34 mm. Her left atrium was mild to  moderately dilated.  Her previous history of ectopy and palpitations were well controlled on metoprolol succinate 75 mg twice a day.  She has been noted to have venous reflux on lower extremity reflux evaluation which did not show any evidence of DVT in October 2021.  A chest CT in January 2023 was done for increasing shortness of breath and revealed probable reactive mediastinal lymph nodes, scattered focal areas of bronchiectasis and clustered nodules in the lower lobes as well as  a solitary nodule in the right lower lobe at 7.5 mm with a 4 mm solid component.  She subsequently was found to have pulmonary embolism on January 23 and was started on Eliquis anticoagulation in addition to Spiriva for her shortness of breath by Dr. Chase Caller.  She was hospitalized with atrial fibrillation with RVR on December 02, 2021.  Her echo Doppler study at that time showed an EF of 60 to 65% with mild LVH there was moderate biatrial enlargement, small circumferential pericardial effusion and she had moderate late systolic prolapse of the middle scallop of the posterior leaflet of the mitral valve resulting in moderate mitral regurgitation.  There is aortic sclerosis.  When I saw her in March 2023 she continued to be in atrial fibrillation at that time metoprolol succinate was further titrated to 100 mg daily but I recommended she take 50 mg twice a day for more blood pressure stability.  She underwent successful cardioversion on January 08, 2022 performed by Dr. Johney Frame.  She was found to be in persistent atrial fibrillation on subsequent evaluation at which time her diltiazem dose was increased to 180 mg and she was referred to atrial fibrillation clinic.  She is not a candidate for amiodarone due to her lung disease.  It was advised that she consider admission for Tikosyn initiation at low-dose 1.5 mg twice a day but apparently she canceled the admission.  She has been very concerned as to what is the best option.  I had a very  long discussion with her today discussing risk benefits of treatment as well as the importance of continued anticoagulation both with her recent PE as well as her persistent atrial fibrillation.  She self reduced her diltiazem back to 120 mg since she felt the 180 mg dose contributed to stomach upset.  After much discussion, presently I will add digoxin to see if we can improve rate control and have given her a prescription for 0.0625 mg daily.  She will contemplate further options.  She will have a follow-up chest CT and office visit with Dr. Chase Caller next month.  She will continue her present dose of metoprolol succinate 75 mg, diltiazem, and continue Eliquis 5 mg twice a day.  She is now on Spiriva for her lung disease.  I will see her in 2 months for follow-up evaluation or sooner as needed.   Troy Sine, MD, [facility]  03/15/2022 12:54 PM

## 2022-03-10 ENCOUNTER — Other Ambulatory Visit: Payer: Self-pay | Admitting: Cardiovascular Disease

## 2022-03-10 ENCOUNTER — Telehealth: Payer: Self-pay | Admitting: Internal Medicine

## 2022-03-10 NOTE — Telephone Encounter (Signed)
Called patient to follow up on a message received from the answering service, " Pt is having a lot of problems breathing"  Patient states that no one ever called her back. Patient is very frustrated because she feels that the medications she is taking for effecting her breathing. Patient denied seeing a NP multiple times.   Please advise, call back [phone].

## 2022-03-10 NOTE — Telephone Encounter (Signed)
Spoke with the pt She was upset that no one called her back over the weekend and says wanted sooner appt with MR  I asked her what symptoms that she was having and she states " I give up" and proceeded to hang up the phone  Sending to MR as [facility]

## 2022-03-11 ENCOUNTER — Telehealth: Payer: Self-pay | Admitting: Cardiovascular Disease

## 2022-03-11 NOTE — Telephone Encounter (Signed)
Pt c/o medication issue:  1. Name of Medication: digoxin (LANOXIN) 0.125 MG tablet  2. How are you currently taking this medication (dosage and times per day)? Half a tablet at night  3. Are you having a reaction (difficulty breathing--STAT)? no  4. What is your medication issue? Patient states she is having a lot of stomach problems on the medication. She says she was also only given enough for 14 days. Patient states if she does not answer her home number to try her cell phone.

## 2022-03-11 NOTE — Telephone Encounter (Signed)
Returned call to pt she states that the digoxin is causing her GI upset and would like another medication. She states that she has taken 9 pills (4 1/2 days-she takes 1/2 tab). She states that she has Celiac disease. Please advise

## 2022-03-11 NOTE — Telephone Encounter (Signed)
Pls call and find out details. There is a note from 5/22 that I addressed. So I do not know what she is furstrated abou right niw

## 2022-03-11 NOTE — Telephone Encounter (Signed)
Called patient. Someone answered the phone and did not say anything before the call disconnected. Will attempt to call her back later.

## 2022-03-14 ENCOUNTER — Telehealth: Payer: Self-pay | Admitting: Cardiovascular Disease

## 2022-03-14 NOTE — Telephone Encounter (Signed)
This will need to be reviewed by Dr. [name] -he'll be back in the office on Wednesday.  I can review it with him at that time.  In the meantime, would she be willing to take it with food?  If not she can stop until then

## 2022-03-14 NOTE — Telephone Encounter (Signed)
Pt c/o swelling: STAT is pt has developed SOB within 24 hours  If swelling, where is the swelling located? Swelling in her legs, right foot she can't get in her shoes.   How much weight have you gained and in what time span? unsure  Have you gained 3 pounds in a day or 5 pounds in a week? unsure  Do you have a log of your daily weights (if so, list)? unsure  Are you currently taking a fluid pill? Yes, needs to know if she can take an extra lasix as needed.   Are you currently SOB? No   Have you traveled recently? no

## 2022-03-14 NOTE — Telephone Encounter (Signed)
ATC patient line just rang and rang with no answer. Will close encounter since this is the second attempt.

## 2022-03-14 NOTE — Telephone Encounter (Signed)
-  Pt called to report bilateral feet/ankle swelling (more on the right) since starting digoxin. -Pt audibly SOB on the phone but state she just walked in the house and hasn't had a chance to catch her breath. Symptoms improved through the course of conversation. -Pt stated SOB isn't new and denies any other symptoms. -Pt also questioning how long she is required to take digoxin.  Will forward to MD

## 2022-03-15 ENCOUNTER — Encounter: Payer: Self-pay | Admitting: Cardiovascular Disease

## 2022-03-18 NOTE — Telephone Encounter (Signed)
Patient is calling back concerned she has not heard back regarding this yet. She is wanting to speak with someone regarding this today. Please advise.

## 2022-03-18 NOTE — Telephone Encounter (Signed)
Called pt to let her know we are still waiting for Dr. Evette Georges response. She verbalized understanding and thanked me for calling her back.

## 2022-03-19 NOTE — Telephone Encounter (Signed)
Follow UP;      Patient is calling to find out what Dr [name] said.

## 2022-03-20 NOTE — Telephone Encounter (Signed)
Reviewed with Dr. Tresa Endo.  Please let patient know that she can stop the digoxin and he won't be giving her any other medication to try in it's place.

## 2022-03-27 DIAGNOSIS — Z86711 Personal history of pulmonary embolism: Secondary | ICD-10-CM | POA: Diagnosis not present

## 2022-03-27 DIAGNOSIS — B372 Candidiasis of skin and nail: Secondary | ICD-10-CM | POA: Diagnosis not present

## 2022-03-27 DIAGNOSIS — R5381 Other malaise: Secondary | ICD-10-CM | POA: Diagnosis not present

## 2022-03-27 DIAGNOSIS — F419 Anxiety disorder, unspecified: Secondary | ICD-10-CM | POA: Diagnosis not present

## 2022-03-27 DIAGNOSIS — I10 Essential (primary) hypertension: Secondary | ICD-10-CM | POA: Diagnosis not present

## 2022-03-27 DIAGNOSIS — I4891 Unspecified atrial fibrillation: Secondary | ICD-10-CM | POA: Diagnosis not present

## 2022-03-27 MED ORDER — SPIRIVA RESPIMAT 1.25 MCG/ACT IN AERS: 2.0000 | INHALATION_SPRAY | Freq: Every day | RESPIRATORY_TRACT | 5 refills | Status: DC

## 2022-03-27 NOTE — Telephone Encounter (Signed)
Rx for Spiriva has been sent to preferred pharmacy for pt. Nothing further needed.

## 2022-03-31 ENCOUNTER — Telehealth: Payer: Self-pay | Admitting: Internal Medicine

## 2022-03-31 ENCOUNTER — Telehealth: Payer: Self-pay | Admitting: Cardiovascular Disease

## 2022-03-31 NOTE — Telephone Encounter (Signed)
-  Pt called to report SOB and palpations. -Pt stated she hasn't slept all night, she's exhausted, heart racing, and experiencing pain all over. -Pt audibly SOB on the phone. Nurse attempted to ask question related to pt's symptoms but she stated "You are making me upset and I can't go through with questions because it takes to much out of me!" -Pt requesting an appointment with Dr. [name]. Nurse advised pt MD is currently out of office and offered to schedule an appointment with the afib clinic. Pt stated she only wanted to be seen in our office. Nurse informed pt no appointments available today and again offered afib clinic.  -Pt stated "Maybe I will just die." "I don't want to live like this anymore and maybe my car will just run off the road."  -Nurse recommended pt contact EMS but pt declined. Pt stated "You apparently can't help me, thanks for listening", then disconnected the line.  -Nurse contacted Dr. Ellyn Hack (DOD) and social worker who recommended contacting [place] police department for wellness check. -Police contacted and stated they will send someone out to check on pt.

## 2022-03-31 NOTE — Telephone Encounter (Signed)
Pt c/o Shortness Of Breath: STAT if SOB developed within the last 24 hours or pt is noticeably SOB on the phone  1. Are you currently SOB (can you hear that pt is SOB on the phone)? Yes   2. How long have you been experiencing SOB? Since last night  3. Are you SOB when sitting or when up moving around? Both   4. Are you currently experiencing any other symptoms? No sleep, palpitations, extremely tired

## 2022-04-03 NOTE — Telephone Encounter (Signed)
Pharmacy team was not managing patient assistnace for either of these medications. Pulmonology clinic also does not keep Eliquis samples. My previous conversation with her detailed that Xarelto would be the more cost-effective option for her based on her insurance but she deferred and switching due to potentially only staying on Eliquis for 6 months. She was denied Eliquis pt assistance in Jan 2023 but could re-apply if she believes she has spent 3% of income on out-of-pocket rx's this calendar year.  Triage - please advise on Spiriva and Eliquis pt assistance program applications as neither [place] nor I were working on these  Knox Saliva, PharmD, MPH, BCPS, CPP Clinical Pharmacist (Rheumatology and Pulmonology)

## 2022-04-03 NOTE — Telephone Encounter (Signed)
ATC patient, LMTCB 

## 2022-04-04 NOTE — Telephone Encounter (Signed)
Spoke with pt who states she is currently taking 1 Eliquis a day because she can not have medication refilled. Pt is requesting sample of Eliquis but we do not have any in office. According to Epic we do not manage pt's Eliquis. Pt was instructed to contact Cardiology. Pt is scheduled for OV on 04/17/22. When pt was asked if we could provide any other assistance she stated know. Conversation was hard to follow. Nothing further needed at this time.

## 2022-04-04 NOTE — Telephone Encounter (Signed)
LMTCB  Will try once more before closing

## 2022-04-07 NOTE — Telephone Encounter (Signed)
Recommend calling pt back to make sure she is ok and try to arrange ov f/u

## 2022-04-08 ENCOUNTER — Other Ambulatory Visit (HOSPITAL_BASED_OUTPATIENT_CLINIC_OR_DEPARTMENT_OTHER): Payer: Medicare Other

## 2022-04-08 NOTE — Telephone Encounter (Signed)
Attempted to contact patient to discuss. Left message to call back.

## 2022-04-08 NOTE — Telephone Encounter (Signed)
Called patient back- advised of message below. Patient states that she is still SOB- she is wanting to be seen sooner. I advised I could try to get her in a few weeks earlier. She will have her CT scan done tomorrow. Dr.Kelly was aware of this test.  Patient verbalized understanding, thankful for call back.

## 2022-04-08 NOTE — Telephone Encounter (Signed)
Patient was returning call. Please advise ?

## 2022-04-09 ENCOUNTER — Ambulatory Visit (HOSPITAL_BASED_OUTPATIENT_CLINIC_OR_DEPARTMENT_OTHER)
Admission: RE | Admit: 2022-04-09 | Discharge: 2022-04-09 | Disposition: A | Discharge status: Home / Self Care | Payer: Medicare Other | Source: Ambulatory Visit | Attending: Internal Medicine | Admitting: Internal Medicine

## 2022-04-09 DIAGNOSIS — R911 Solitary pulmonary nodule: Secondary | ICD-10-CM | POA: Diagnosis not present

## 2022-04-09 DIAGNOSIS — J479 Bronchiectasis, uncomplicated: Secondary | ICD-10-CM | POA: Diagnosis not present

## 2022-04-09 DIAGNOSIS — J90 Pleural effusion, not elsewhere classified: Secondary | ICD-10-CM | POA: Diagnosis not present

## 2022-04-17 ENCOUNTER — Encounter: Payer: Self-pay | Admitting: Internal Medicine

## 2022-04-17 ENCOUNTER — Telehealth: Payer: Self-pay | Admitting: Internal Medicine

## 2022-04-17 ENCOUNTER — Ambulatory Visit (INDEPENDENT_AMBULATORY_CARE_PROVIDER_SITE_OTHER): Payer: Medicare Other | Admitting: Internal Medicine

## 2022-04-17 VITALS — BP 106/62 | HR 77 | Temp 98.2°F | Ht 61.0 in | Wt 119.4 lb

## 2022-04-17 DIAGNOSIS — J479 Bronchiectasis, uncomplicated: Secondary | ICD-10-CM

## 2022-04-17 DIAGNOSIS — R0609 Other forms of dyspnea: Secondary | ICD-10-CM | POA: Diagnosis not present

## 2022-04-17 DIAGNOSIS — R7989 Other specified abnormal findings of blood chemistry: Secondary | ICD-10-CM | POA: Diagnosis not present

## 2022-04-17 LAB — CBC WITH DIFFERENTIAL/PLATELET
Basophils Absolute: 0 10*3/uL (ref 0.0–0.1)
Basophils Relative: 0.8 % (ref 0.0–3.0)
Eosinophils Absolute: 0 10*3/uL (ref 0.0–0.7)
Eosinophils Relative: 0.7 % (ref 0.0–5.0)
HCT: 38.7 % (ref 36.0–46.0)
Hemoglobin: 12.6 g/dL (ref 12.0–15.0)
Lymphocytes Relative: 26.4 % (ref 12.0–46.0)
Lymphs Abs: 1.5 10*3/uL (ref 0.7–4.0)
MCHC: 32.7 g/dL (ref 30.0–36.0)
MCV: 89.4 fl (ref 78.0–100.0)
Monocytes Absolute: 0.9 10*3/uL (ref 0.1–1.0)
Monocytes Relative: 15.4 % — ABNORMAL HIGH (ref 3.0–12.0)
Neutro Abs: 3.3 10*3/uL (ref 1.4–7.7)
Neutrophils Relative %: 56.7 % (ref 43.0–77.0)
Platelets: 205 10*3/uL (ref 150.0–400.0)
RBC: 4.32 Mil/uL (ref 3.87–5.11)
RDW: 14.3 % (ref 11.5–15.5)
WBC: 5.8 10*3/uL (ref 4.0–10.5)

## 2022-04-17 LAB — D-DIMER, QUANTITATIVE: D-Dimer, Quant: 0.73 mcg/mL FEU — ABNORMAL HIGH (ref <0.50)

## 2022-04-17 LAB — BRAIN NATRIURETIC PEPTIDE: Pro B Natriuretic peptide (BNP): 870 pg/mL — ABNORMAL HIGH (ref 0.0–100.0)

## 2022-04-17 NOTE — Patient Instructions (Addendum)
DOE (dyspnea on exertion)   - scoliosis, bronchiectasis, mitral valve leak and and grade 2 heart muscle stiffness, atrial flutter contributing to respiratory symptoms - stable   Plan -Supportive care - Address individual components  #Pulmonary embolism diagnosed January 2023  - no recurrence since oroginal onset. Understand eliquis is expensive. Have completed 6 months  Plan  -Cotninue eliquis but reduce from 56m twice daily to 2.576mtwice daily  - will check if DR KeClaiborne Billingss ok with lower dose in setting of A Fib    #Bronchiectasis  -Appears  Spiriva and flutter valve have not been given good opportunity fo see benefit -We thought we gave clear instruction on usage last 2-3 times but happy to repeat it again 04/17/2022    Plan  -Continue Spiriva  2puff once daily - wil give sample again and redo prescription - Flutter valve 5 times daily  Right lower lobe lung nodule 7.62m69mn CT 10/09/21 - new  - low risk for lung cancer. Prior lung nodule resolved CT July 2023   Plan  - no need for CT  Positive rheumatoid factor January 2023  - shared decisio  making we wil follow expectantly since joints are okay   Plan  - At some point we can discuss visiting rheumatologist but hold off for the moment   Mitral valve insufficiency, unspecified etiology Grade II diastolic dysfunction ATrial flutter   - concerned your might have decompensation and congestion of your heart muscle function  Plan' -  check bnp, cbc, bmet, d-dimer 04/17/2022     Followup - return in 2 months with Dr RamChase Caller30 min visit

## 2022-04-17 NOTE — Progress Notes (Signed)
OV 10/22/2021  Subjective:  Patient ID: Janet Nguyen, female , DOB: 03-23-1932 , age 88 y.o. , MRN: [id number] , ADDRESS: [place]  [id number] PCP Janet Cruel, MD Patient Care Team: Janet Cruel, MD as PCP - General (Family Medicine) Janet Sine, MD as PCP - Cardiology (Cardiology)  This Provider for this visit: Treatment Team:  Attending Provider: Brand Males, MD    10/22/2021 -   Chief Complaint  Patient presents with   Consult    Pt had a recent CT performed which is the reason for today's visit.     HPI Janet Nguyen 88 y.o. -referred for shortness of breath and cough.  Has known severe scoliosis.  Found to have bronchiectasis on the CT chest.  History is provided by the patient and review of the medical records.  She is a very good historian.  She is divorced.  She has grown children who all live all over the country.  She has several grandchildren.  She says she is known to have deviated nasal septum, celiac disease for which she is on a restrictive diet, sleep apnea but she cannot tolerate CPAP.  She is most importantly known to have severe scoliosis that was first diagnosed in her younger years.  She says after age 13 is around when they made the diagnosis although she suspect she has had a longer period she is just on observation treatment.  She did yoga and severe exercise to keep herself physically fit.  Over the years scoliosis is gotten worse that she is lost 6 inches in total height.  This is producing physical pressure effects.  She thinks with the onset of the pandemic and particularly with worsening of the scoliosis and particularly for the last few months or so she said dyspnea on exertion [echo did show grade 2 diastolic dysfunction], decreased socializing, decreased sleep, feeling depressed.  Also she feels her migraines are worse.  She when she wakes up in the morning she feels an elephant is on the chest.  She  also has a cough.  The respiratory symptoms are mild overall.  But she does definitely have it and is definitely new.  Sometime back a year ago or 2 years ago she is to be able to walk at least a mile now she gets dyspneic walking a block.  Of note she reports a few episodes of severe vomiting acute episodes 2 times in 2022.  Idiopathic.  There is associated heartburn  She has had the COVID-vaccine but does not have the COVID itself  Review of system positive for fatigue for the last several months arthralgia for the last several years.  Denies any family history of lung disease  Denies any substance abuse  Lives in a 86 year old home.  Detailed review shows no organic dust antigen exposure.  She does have a bird feeder.  Occupational history: Detail organic and inorganic antigen exposure history is negative    CT Chest data 10/09/21 -    IMPRESSION: 1. 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 Chronic aspiration could have a similar appearance. 2. Part solid nodule of the right lower lobe measuring 7.5 mm in mean diameter with 4 mm solid component. Follow-up non-contrast CT recommended at 3-6 months to confirm persistence. If unchanged,  and solid component remains <6 mm, annual CT is recommended until 5 years of stability has been established. If persistent these nodules should be considered highly suspicious if the solid component of the nodule is 6 mm or greater in size and enlarging. This recommendation follows the consensus statement: Guidelines for Management of Incidental Pulmonary Nodules Detected on CT Images: From the Fleischner Society 2017; Radiology 2017; 284:228-243. 3.  Aortic Atherosclerosis (ICD10-I70.0).     Electronically Signed   By: Janet Nguyen M.D.    On: 10/09/2021 15:34  No results found.   11/06/2021 Follow up : PE   88 yo female never smoker seen for pulmonary consult 10/22/21 for bronchiectasis and shortness of breath found to have Pulmonary embolism on VQ scan .  Medical history of severe scoliosis /kyphosis , OSA -CPAP intolerant   TEST/EVENTS :  VQ scan 10/25/21- multiple moderate perfusion defects with RML/RLL + PE.  Venous Doppler 09/2021 neg  Echo Nml EF , Gr 2 DD, Elevated PAP 48mHg. Severe LAE and Mod RA, mod to severe MR   Patient returns for a 1 week follow up . Recently seen for pulmonary consult for bronchiectasis and shortness of breath. Labs showed elevated  D Dimer . Subsequent VQ scan on 10/25/21 was positive for PE. 2 D echo showed mild elevated PAP at 362mg, Gr 2 DD , Severe LAE and Mod RA, mod to severe MR. Venous doppler was negative for DVT.  Has some minimally productive dry cough. Was started on Spiriva but did not take.  She was started on Eliquis , she has started on starter pack .  Unfortunately patient's insurance does not cover and prescription is greater than $600.  We discussed patient assistance program.  We have also contacted our pharmacy department to help us[place] alternative options.  Patient education on anticoagulation therapy.  Patient says she has some minimum daily cough.  She was started on Spiriva last visit.  Unfortunately did not start this.  We discussed the reason for Spiriva.  Went over her recent CT chest that shows some bronchiectasis.  Autoimmune labs did show elevated rheumatoid factor and ANA, CCP was negative.  Patient has some mild arthritis but no significant joint swelling.  Sjogren's panel was negative. Typically active with light walking. Limited with back pain with scoliosis.  Lives alone. Drives. Does light housework , shopping. Has 4 kids, lots of grandkids.   11/22/2021 Follow Up PE/ Bronchiectasis/ Exertional dyspnea  PaHALEY FUERSTENBERGs a [age].0 female never smoker seen  for pulmonary consult 10/22/21 for bronchiectasis and shortness of breath. Additionally she was  found to have Pulmonary embolism on VQ scan . PE is unprovoked.  May need to consider lifelong anticoagulation or at least for 6 months to 1 year of therapy.  Will need to weigh the risk and benefit ratio.She is followed by Dr. RaChase Caller Pt. Presents for follow up . She states she has been doing well. She has been compliant with her  Eliquis. No bleeding.  She does not use NSAIDS. We are working on getting her reduced cost medication through the pharmacy. She understands she cannot skip a dose of Eliquis. .We reviewed bleeding precautions and safe use of sharp objects, in addition to fall precautions. She denies any lower extremity swelling.No bruising noted  She does continue to have exertional dyspnea.  She states she has been compliant with her Spiriva. She states she is unsure if it is helping or not, but is willing  to try this for a bit longer.    She is using her flutter valve 8-10 times a day. 2-4 blows at a time.She states she has not been able to cough anything up. We will add Mucinex to see if this helps. We discussed that this is a daily maintenance for her bronchiectasis. She is very compliant. She has had many questions today, that we have addressed.    PFT's look restrictive, which is expected with her degree of scoliosis.  DLCO with mild/Moderate reduction  Lung volumes are slightly reduce  OV 11/28/2021  Subjective:  Patient ID: Janet Nguyen, female , DOB: 1932/04/08 , age 89 y.o. , MRN: [id number] , ADDRESS: [place] [id number] PCP Janet Cruel, MD Patient Care Team: Janet Cruel, MD as PCP - General (Family Medicine) Janet Sine, MD as PCP - Cardiology (Cardiology)  This Provider for this visit: Treatment Team:  Attending Provider: Brand Males, MD    11/28/2021 -   Chief Complaint  Patient presents with   Follow-up    Pt  states she has been having problems with the cost of the Eliquis medication. States she took her last pill today 3/2. States that she has been more SOB than before.   #Multifactorial dyspnea  -Scoliosis, bronchiectasis seen on CT scan, mitral valve regurgitation grade 2 diastolic dysfunction and pulmonary embolism January 2023   #pulm embolism diagnosed January 2023 through VQ scan and high D-dimer  -On outpatient Eliquis  #Bronchiectasis diagnosis given genera 2023  -On Spiriva and flutter valve  #Right lower lobe lung nodule new 7.5 mm January 2023  -On surveillance  #Positive rheumatoid factor new diagnosis January 2023  -On surveillance   HPI VALLERIE HENTZ 88 y.o. -returns for follow-up.  I saw her in January 2023.  At that time diagnosis of pulmonary embolism was made.  She is here for follow-up.  Since then she is seen nurse practitioner.  She is taking Eliquis.  Recently today she is her last day of Eliquis.  She needs to pay for Eliquis.  It is over $500.  She is struggle to come to this decision.  Pharmacy team has been in communication with me.  We had a conversation.  At first she said she did not want to pay for the Eliquis.  It appears to have done extensive research and only warfarin would be cheap and affordable.  We went through the relative risk can inconvenience features between DOAC and Warfarin.  She then decided to go with DOAC and making the more expensive payment.  She then also discussed the difference between Eliquis and Xarelto.  She believes Xarelto could be risky compared to Eliquis.  So she decided to stick with Eliquis but she asked the pharmacy team to investigate and give her an updated information.    She does have other complaints.  She is concerned about her carotid artery.  In 2017 she might of had some mild carotid stenosis according to the ultrasound report.  I asked her to follow-up with primary care physician.  She wanted to discuss cardiac issues of  mitral valve regurgitation and grade 2 diastolic dysfunction.  Have asked her to discuss this with Dr. [name] but did indicate that it is contributing to shortness of breath.  She has positive rheumatoid factor and we said we will address it at a future visit.  She does have lung nodules and she is due for a follow-up CT in the  spring 2023.  She has bronchiectasis and she said the Spiriva and flutter valve might not be helping but for now she is going to continue those.    CT Chest data  No results found.       OV 01/24/2022  Subjective:  Patient ID: Janet Nguyen, female , DOB: 08-29-1932 , age 20 y.o. , MRN: [id number] , ADDRESS: [place] [id number] PCP Janet Cruel, MD Patient Care Team: Janet Cruel, MD as PCP - General (Family Medicine) Janet Sine, MD as PCP - Cardiology (Cardiology)  This Provider for this visit: Treatment Team:  Attending Provider: Brand Males, MD    01/24/2022 -   Chief Complaint  Patient presents with   Follow-up    Pt states she has been doing okay since last visit. States she has been in and out of the hospital since last visit.    #Multifactorial dyspnea  -Scoliosis, bronchiectasis seen on CT scan, mitral valve regurgitation grade 2 diastolic dysfunction, atrial flutter and pulmonary embolism January 2023   #pulm embolism diagnosed January 2023 through VQ scan and high D-dimer  -On outpatient Eliquis  #Bronchiectasis diagnosis given genera 2023  -On Spiriva and flutter valve  #Right lower lobe lung nodule new 7.5 mm January 2023  -On surveillance  #Positive rheumatoid factor new diagnosis January 2023  -On surveillance  HPI Janet Nguyen 88 y.o. -returns for follow-up.  She continues on Eliquis.  She is tolerating it well.  Issues that it is very expensive she is spending more than $5 per month.  She is going to work with [employer] company trying to reduce the cost.  I talked about  limiting the Eliquis to 6 months and then going on baby aspirin based on D-dimer.  However she says she wants to work with [employer] and try to reduce the cost.  She prefers to take Eliquis as long as medically indicated.  Shortness of breath wise she is stable.  She recently was admitted for diastolic dysfunction heart failure and atrial flutter in March 2023.  I reviewed the chart.  This followed a 10 pound weight gain.  She is back to feeling baseline.    Regarding her rheumatoid factor positivity: She says she does not have much of morning stiffness and joint mobility is pretty good therefore she wants to hold off on seeing rheumatologist.    Regarding emphysem and mild bronchiectasis: She is again not using the Spiriva and flutter valve.  She again tells [place] that she has not been instructed clearly.  We actually had her for a second time and had to give instructions.  She does not recommend this.  The CMA went in and showed her the devices Spiriva Respimat.  Then she remembered it.  We gave her instructions again.  She wants to try empiric Spiriva for a time-limited trial along with flutter valve and then see if this helps her shortness of breath  Lung nodule: She has not had a 15-monthCT and April 2023.  Radiologist recommended endograft 3-657-montho we will get it accordingly.        OV 04/17/2022  Subjective:  Patient ID: PaWynelle Linkfemale , DOB: 02/1932/08/12 age 89.o. , MRN: [id number] [address]: [address] [id number] RoLawerance CruelMD Patient Care Team: RoLawerance CruelMD as PCP - General (Family Medicine) KeTroy SineMD as PCP - Cardiology (Cardiology)  This Provider for this visit: Treatment Team:  Attending Provider: Brand Males, MD   #Multifactorial dyspnea  -Scoliosis, bronchiectasis seen on CT scan, mitral valve regurgitation grade 2 diastolic dysfunction, atrial flutter and pulmonary embolism January 2023    #pulm embolism diagnosed January 2023 through VQ scan and high D-dimer  -On outpatient Eliquis  #Bronchiectasis diagnosis given genera 2023  -On Spiriva and flutter valve  #Right lower lobe lung nodule new 7.5 mm January 2023  -On surveillance  #Positive rheumatoid factor new diagnosis January 2023  -On surveillance  04/17/2022 -   Chief Complaint  Patient presents with   Follow-up    Patient here to go over CT results.      HPI Janet Nguyen 88 y.o. -returns for follow-up.  She continues on full dose Eliquis.  She is complaining about the cost of Eliquis.  She is wondering if this can be reduced.  She has completed 6 months of full dose treatment for pulmonary embolism.  However in the interim she has atrial fibrillation and she is on Eliquis for that.  I have return to Dr. [name] about this.  However she is continuing to have significant amount of symptoms.  She feels frustrated.  For.  She feels that nobody is listening to her and helping her address her symptoms.  However records indicate otherwise.  She also tells me that in terms of   #shortness of breath -this is not worse after the cardioversion.  She is unable to walk a few feet.  Particularly after April 2023.  Her weight was 113 pounds in April currently at 116 pounds but the weight fluctuates she states.  Also in the last 1 month  #Worsening pedal edema.  She is on Lasix and this is despite that.  She has significant varicose veins.  #Nonspecific side effects of Eliquis apart from being expensive she feels that she is "bleeding all over and she shows a body but there is no bruising or anything.  She pointed out to some discrete small macular heads of skin lesions and she says it is because of Eliquis.  #Sinus says she feels this is acting up.  She has palpitations.  She also has irritable bowel syndrome.  #Bronchiectasis she is already using a flutter valve.  She states we did not educate her currently.  Records show  otherwise.  She is also not using her Spiriva.  She says she ran out.  #Lung nodule: She had CT scan of the chest shows the lung nodules resolved/very small.    CT Chest data 04/09/2022  Narrative & Impression  CLINICAL DATA:  Lung nodule.   EXAM: CT CHEST WITHOUT CONTRAST   TECHNIQUE: Multidetector CT imaging of the chest was performed following the standard protocol without IV contrast.   RADIATION DOSE REDUCTION: This exam was performed according to the departmental dose-optimization program which includes automated exposure control, adjustment of the mA and/or kV according to patient size and/or use of iterative reconstruction technique.   COMPARISON:  10/09/2021, 07/01/2017.   FINDINGS: Cardiovascular: Atherosclerotic calcification of the aorta, aortic valve and coronary arteries. Enlarged pulmonic trunk and heart. No pericardial effusion.   Mediastinum/Nodes: Mediastinal lymph nodes are not enlarged by CT size criteria and appear similar to prior. Hilar regions are difficult to definitively evaluate without IV contrast. No axillary adenopathy. Esophagus is grossly unremarkable.   Lungs/Pleura: Scattered mucoid impaction. Central bronchiectasis. Scarring in the right middle lobe and lingula. New bilateral pleural effusions, moderate to large on  the right and small to moderate on the left. Patchy ground-glass in the adjacent right lower lobe. Basilar septal thickening. Pulmonary nodules measure 4 mm or less in size, as on 10/09/2021 and likely benign. Previously described part solid nodule in right lower lobe is not visualized. Airway is unremarkable.   Upper Abdomen: Visualized portions of the liver and right adrenal gland are unremarkable. Left adrenal thickening, as before, no follow-up necessary. 1.8 cm fluid density lesion in the upper pole left kidney, indicative of a cyst. No follow-up necessary. Visualized portions of the spleen, pancreas and stomach  are unremarkable. Left lumbar hernia contains unobstructed colon.   Musculoskeletal: Degenerative changes in the spine with scoliosis. No worrisome lytic or sclerotic lesions.   IMPRESSION: 1. Previously described part solid right lower lobe nodule is not visualized. 2. Congestive heart failure. 3. Aortic atherosclerosis (ICD10-I70.0). Coronary artery calcification. 4. Enlarged pulmonic trunk, indicative of pulmonary arterial hypertension.     Electronically Signed   By: Lorin Picket M.D.   On: 04/10/2022 15:07      No results found.    PFT     Latest Ref Rng & Units 10/22/2021   11:26 AM  PFT Results  FVC-Pre L 1.79   FVC-Predicted Pre % 89   FVC-Post L 1.78   FVC-Predicted Post % 89   Pre FEV1/FVC % % 73   Post FEV1/FCV % % 76   FEV1-Pre L 1.30   FEV1-Predicted Pre % 89   FEV1-Post L 1.36   DLCO uncorrected ml/min/mmHg 10.42   DLCO UNC% % 61   DLCO corrected ml/min/mmHg 10.42   DLCO COR %Predicted % 61   DLVA Predicted % 84   TLC L 4.21   TLC % Predicted % 89   RV % Predicted % 101        has a past medical history of Arrhythmia, Atrial flutter (HCC), Celiac disease, GERD (gastroesophageal reflux disease), Hypertension, Intervertebral disc stenosis of neural canal of cervical region, Irregular heart beat (11/30/2012), Osteoporosis, PMR (polymyalgia rheumatica) ([place]), Scoliosis, Scoliosis, and Sleep apnea (10/02/11 [facility]).   reports that she has never smoked. She has never used smokeless tobacco.  Past Surgical History:  Procedure Laterality Date   APPENDECTOMY     ruptured at age 38 and had surgery   CARDIAC CATHETERIZATION  01/27/06   CARDIOVERSION N/A 01/08/2022   Procedure: CARDIOVERSION;  Surgeon: Freada Bergeron, MD;  Location: [facility] ENDOSCOPY;  Service: Cardiovascular;  Laterality: N/A;   cataract surgery  2015   Dr. Herbert Deaner; March & April 2015    Allergies  Allergen Reactions   Amoxicillin-Pot Clavulanate     Has  never had, son allergic  Other reaction(s): Vomiting   Gluten Meal Other (See Comments)    Unknown   Naproxen Other (See Comments)    Stomach upset    Immunization History  Administered Date(s) Administered   Influenza Split 06/26/2014   Influenza, High Dose Seasonal PF 06/15/2015, 06/19/2016, 07/24/2017, 05/24/2019, 06/20/2021   Influenza,inj,quad, With Preservative 06/29/2018   Influenza-Unspecified 06/30/2013   PFIZER(Purple Top)SARS-COV-2 Vaccination 10/14/2019, 11/04/2019, 07/18/2020, 07/01/2021   Pneumococcal Conjugate-13 03/09/2014   Tdap 03/21/2009, 05/29/2021    Family History  Problem Relation Age of Onset   Breast cancer Mother    Heart disease Father    Migraines Neg Hx      Current Outpatient Medications:    acetaminophen (TYLENOL) 500 MG tablet, Take 500 mg by mouth every 8 (eight) hours as needed for moderate pain.,  Disp: , Rfl:    Aloe-Sodium Chloride (AYR SALINE NASAL GEL NA), Place 1 application. into the nose daily as needed (rhinitis)., Disp: , Rfl:    ALPRAZolam (XANAX) 0.5 MG tablet, Take 0.25-0.5 mg by mouth 2 (two) times daily as needed for anxiety., Disp: , Rfl:    apixaban (ELIQUIS) 5 MG TABS tablet, Take 1 tablet (5 mg total) by mouth 2 (two) times daily., Disp: 56 tablet, Rfl: 0   Biotin w/ Vitamins C & E (HAIR SKIN & NAILS GUMMIES PO), Take 1 tablet by mouth daily., Disp: , Rfl:    Calcium Carb-Ergocalciferol (CHEWABLE CALCIUM/D PO), Take 1 each by mouth daily., Disp: , Rfl:    clidinium-chlordiazePOXIDE (LIBRAX) 5-2.5 MG capsule, Take 1 capsule by mouth daily as needed., Disp: 60 capsule, Rfl: 3   digoxin (LANOXIN) 0.125 MG tablet, Take 0.5 tablets (0.0625 mg total) by mouth daily., Disp: 15 tablet, Rfl: 3   diltiazem (CARDIZEM CD) 120 MG 24 hr capsule, Take 1 capsule (120 mg total) by mouth every morning., Disp: , Rfl:    fluticasone (FLONASE) 50 MCG/ACT nasal spray, Place 1 spray into both nostrils daily as needed for allergies or rhinitis., Disp:  , Rfl:    metoprolol succinate (TOPROL-XL) 50 MG 24 hr tablet, TAKE 1 AND 1/2 TABLETS BY MOUTH TWO TIMES A DAY, Disp: 270 tablet, Rfl: 0   Phenylephrine-APAP-guaiFENesin (EQ SINUS CONGESTION & PAIN PO), Take 1 tablet by mouth daily as needed (for sinus pain). Contains acetaminophen 325 mg and Phenylephrine 5 mg, Disp: , Rfl:    polyethylene glycol powder (GLYCOLAX/MIRALAX) 17 GM/SCOOP powder, Take 17 g by mouth daily as needed for moderate constipation., Disp: , Rfl:    Tiotropium Bromide Monohydrate (SPIRIVA RESPIMAT) 1.25 MCG/ACT AERS, Inhale 2 puffs into the lungs daily., Disp: 4 g, Rfl: 5   Tiotropium Bromide Monohydrate (SPIRIVA RESPIMAT) 1.25 MCG/ACT AERS, Inhale 2 puffs into the lungs daily., Disp: 4 g, Rfl: 5   zolpidem (AMBIEN) 5 MG tablet, Take 2.5 mg by mouth at bedtime., Disp: , Rfl:    furosemide (LASIX) 20 MG tablet, Take 1 tablet (20 mg total) by mouth daily., Disp: 90 tablet, Rfl: 3   Omega-3 Fatty Acids ([employer] OMEGA-3 GUMMY FISH PO), Take 1 each by mouth daily. (Patient not taking: Reported on 04/17/2022), Disp: , Rfl:    potassium chloride (KLOR-CON M) 10 MEQ tablet, Take 1 tablet (10 mEq total) by mouth daily., Disp: 90 tablet, Rfl: 3   traMADol (ULTRAM) 50 MG tablet, Take 50 mg by mouth 2 (two) times daily as needed for severe pain. (Patient not taking: Reported on 04/17/2022), Disp: , Rfl:       Objective:   Vitals:   04/17/22 1338  BP: 106/62  Pulse: 77  Temp: 98.2 F (36.8 C)  TempSrc: Oral  SpO2: 97%  Weight: 119 lb 6.4 oz (54.2 kg)  Height: 5' 1"  (1.549 m)    Estimated body mass index is 22.56 kg/m as calculated from the following:   Height as of this encounter: 5' 1"  (1.549 m).   Weight as of this encounter: 119 lb 6.4 oz (54.2 kg).  @WEIGHTCHANGE @  [employer]   04/17/22 1338  Weight: 119 lb 6.4 oz (54.2 kg)     Physical Exam    General: No distress. Looks well Neuro: Alert and Oriented x 3. GCS 15. Speech normal Psych: Pleasant Resp:  Barrel  Chest - no.  Wheeze - no, Crackles - YES, No overt respiratory distress CVS: Normal  heart sounds. Murmurs - no Ext: Stigmata of Connective Tissue Disease - no HEENT: Normal upper airway. PEERL +. No post nasal drip Has scoliosis       Assessment:       ICD-10-CM   1. DOE (dyspnea on exertion)  R06.09 B Nat Peptide    CBC w/Diff    D-Dimer, Quantitative    D-Dimer, Quantitative    CBC w/Diff    B Nat Peptide    2. Bronchiectasis without complication (HCC)  U43.8 B Nat Peptide    CBC w/Diff    D-Dimer, Quantitative    D-Dimer, Quantitative    CBC w/Diff    B Nat Peptide    3. Elevated d-dimer  R79.89 D-Dimer, Quantitative    D-Dimer, Quantitative         Plan:     Patient Instructions  DOE (dyspnea on exertion)   - scoliosis, bronchiectasis, mitral valve leak and and grade 2 heart muscle stiffness, atrial flutter contributing to respiratory symptoms - stable   Plan -Supportive care - Address individual components  #Pulmonary embolism diagnosed January 2023  - no recurrence since oroginal onset. Understand eliquis is expensive. Have completed 6 months  Plan  -Cotninue eliquis but reduce from 42m twice daily to 2.549mtwice daily  - will check if DR KeClaiborne Billingss ok with lower dose in setting of A Fib    #Bronchiectasis  -Appears  Spiriva and flutter valve have not been given good opportunity fo see benefit -We thought we gave clear instruction on usage last 2-3 times but happy to repeat it again 04/17/2022    Plan  -Continue Spiriva  2puff once daily - wil give sample again and redo prescription - Flutter valve 5 times daily  Right lower lobe lung nodule 7.30m1mn CT 10/09/21 - new  - low risk for lung cancer. Prior lung nodule resolved CT July 2023   Plan  - no need for CT  Positive rheumatoid factor January 2023  - shared decisio  making we wil follow expectantly since joints are okay   Plan  - At some point we can discuss visiting rheumatologist  but hold off for the moment   Mitral valve insufficiency, unspecified etiology Grade II diastolic dysfunction ATrial flutter   - concerned your might have decompensation and congestion of your heart muscle function  Plan' -  check bnp, cbc, bmet, d-dimer 04/17/2022     Followup - return in 2 months with Dr RamChase Caller30 min visit  High complex medical condition requiring intensive therapeutic monitoring  SIGNATURE    Dr. MurBrand Nguyen.D., F.C.C.P,  Pulmonary and Critical Care Medicine Staff Physician, [place] [facility] - Interstitial Lung Disease  Program  Pulmonary FibCollege Place [place]: [phone] no answer or between  15:00h - 7:00h: call [phone] Telephone: [phone]  5:46 PM 04/17/2022

## 2022-04-17 NOTE — Telephone Encounter (Signed)
Janet Nguyen  Let patien tknow that her BNP is up and is 870. I have sent message to Dr [name] but she needs to call him too  THanks    SIGNATURE    Dr. Brand Males, M.D., F.C.C.P,  Pulmonary and Critical Care Medicine Staff Physician, [place] Director - Interstitial Lung Disease  Program  Medical Director - [facility] [place] [facility] [place], [place], [address]  NPI Number:  NPI #[id number] [facility] Number: [id number]  Pager: [phone], If no answer  -[place] or [place] [phone] Telephone (clinical office): [phone] Telephone (research): [phone]  5:51 PM 04/17/2022

## 2022-04-17 NOTE — Telephone Encounter (Signed)
Hi TOm  Thu Janet Nguyen= appears to have worsening pedeal edema and wornseing dyspnea since cardioversion. I am checking BNP 04/17/2022= . Also ok to reduce eliquis to 2.84m bid? She has completed 6 months for PE Rx but understand she has  A Fib  Janet Nguyen    SIGNATURE    Dr. MBrand Males M.D., F.C.C.P,  Pulmonary and Critical Care Medicine Staff Physician, C[place]terDirector - Interstitial Lung Disease  Program  Medical Director - [facility] [place] [address] NPI Number:  NPI ##[id number] Med CtrNumber: [id number] Pager: [phone] If no answer  ->[place] [phone] Telephone (clinical office): [phone] Telephone (research): [phone]  1:55 PM 04/17/2022

## 2022-04-18 ENCOUNTER — Telehealth: Payer: Self-pay | Admitting: Cardiovascular Disease

## 2022-04-18 NOTE — Telephone Encounter (Signed)
Called the patient concerning her phone call. She stated that she had labs drawn with Dr. Chase Caller and her BNP was 870.  She stated that she has been having bilateral lower extremity edema. She denies shortness of breath. She stated that the swelling does go down overnight. She denies a weight gain.   She was also calling to see if she could lower her Eliquis dose to 2.5 mg twice daily. She has a follow up with Dr. [name] on 8/2.

## 2022-04-18 NOTE — Telephone Encounter (Signed)
Attempted to call pt but unable to reach. Left message for her to return call. 

## 2022-04-18 NOTE — Telephone Encounter (Signed)
Patient diagnosed with PE on 1/27.  Needs to stay on Eliquis 40m for at least 6 months and then can reduce dose.  Can consider changing to 2.551mBID in 1 week

## 2022-04-18 NOTE — Telephone Encounter (Signed)
Patient called stating Dr. Dillard Essex advised her to call us to let us know her B and P 570.

## 2022-04-18 NOTE — Telephone Encounter (Signed)
Called and spoke to patient about her results. Advised her to call her cardiologist and get an appt. Patient stated that she understood. Nothing further needed

## 2022-04-18 NOTE — Telephone Encounter (Signed)
I spoke with the patient and she will discuss the Eliquis reduction to 2.5 mg at her 8/2 visit wit Dr [name].  She will continue taking 5 mg until then.

## 2022-04-21 ENCOUNTER — Telehealth: Payer: Self-pay | Admitting: Cardiovascular Disease

## 2022-04-21 ENCOUNTER — Telehealth: Payer: Self-pay | Admitting: Internal Medicine

## 2022-04-21 MED ORDER — SPIRIVA RESPIMAT 1.25 MCG/ACT IN AERS: 2.0000 | INHALATION_SPRAY | Freq: Every day | RESPIRATORY_TRACT | 0 refills | Status: DC

## 2022-04-21 NOTE — Addendum Note (Signed)
Addended by: Fran Lowes on: 04/21/2022 04:49 PM   Modules accepted: Orders

## 2022-04-21 NOTE — Telephone Encounter (Signed)
Called patient back. She states she was told to be seen by [facility] sooner than her upcoming appointment on 08/02- after review of her chest CT scan, she would like to have Dr.Kelly review the results and see if she should be seen sooner. Patient also stated her blood work showed her BNP was elevated, she would like to have him look at those as well. I advised for her to keep her appointment on 08/02 and I would notify Dr.Kelly.  Patient verbalized understanding.

## 2022-04-21 NOTE — Telephone Encounter (Signed)
Called and spoke with patient. Patient verbalized understanding. Nothing further needed.  

## 2022-04-21 NOTE — Telephone Encounter (Signed)
Okay for sample of Spiriva if you have it  Okay for flutter valve if we have it - Although please ask Raquel Sarna I think we have given flutter valve to her before once or twice  Regarding blood work Raquel Sarna called her last week and told her that her BNP was high and to have Dr. [name] address it.  Rest of the blood work is fine.  She has already been informed about her blood work.  Please refer to the previous note.  Please remind her that Raquel Sarna called her

## 2022-04-21 NOTE — Telephone Encounter (Signed)
Pt calling to f/u on test results. She would like a callback. Please advise 

## 2022-04-21 NOTE — Telephone Encounter (Signed)
Called patient and she would like results of her lab work.   Please advise sir

## 2022-04-23 ENCOUNTER — Other Ambulatory Visit: Payer: Self-pay

## 2022-04-23 DIAGNOSIS — R053 Chronic cough: Secondary | ICD-10-CM

## 2022-04-23 NOTE — Telephone Encounter (Signed)
With her last Cr normal, plan to recheck and if normal renal fxn remains probably kep on eliquis 5 mg bid. She has been added on to my schedule to see next week. Gershon Mussel

## 2022-04-23 NOTE — Progress Notes (Signed)
fl

## 2022-04-24 NOTE — Telephone Encounter (Signed)
Thank you so much. Repl;y not needed

## 2022-04-30 ENCOUNTER — Ambulatory Visit (INDEPENDENT_AMBULATORY_CARE_PROVIDER_SITE_OTHER): Payer: Medicare Other | Admitting: Cardiovascular Disease

## 2022-04-30 ENCOUNTER — Encounter: Payer: Self-pay | Admitting: Cardiovascular Disease

## 2022-04-30 VITALS — BP 140/82 | HR 86 | Ht 61.0 in | Wt 121.6 lb

## 2022-04-30 DIAGNOSIS — Z79899 Other long term (current) drug therapy: Secondary | ICD-10-CM | POA: Diagnosis not present

## 2022-04-30 DIAGNOSIS — R600 Localized edema: Secondary | ICD-10-CM

## 2022-04-30 DIAGNOSIS — R0602 Shortness of breath: Secondary | ICD-10-CM

## 2022-04-30 DIAGNOSIS — G4733 Obstructive sleep apnea (adult) (pediatric): Secondary | ICD-10-CM

## 2022-04-30 DIAGNOSIS — J479 Bronchiectasis, uncomplicated: Secondary | ICD-10-CM | POA: Diagnosis not present

## 2022-04-30 DIAGNOSIS — I4819 Other persistent atrial fibrillation: Secondary | ICD-10-CM

## 2022-04-30 DIAGNOSIS — M4125 Other idiopathic scoliosis, thoracolumbar region: Secondary | ICD-10-CM | POA: Diagnosis not present

## 2022-04-30 DIAGNOSIS — Z86711 Personal history of pulmonary embolism: Secondary | ICD-10-CM | POA: Diagnosis not present

## 2022-04-30 DIAGNOSIS — Z7901 Long term (current) use of anticoagulants: Secondary | ICD-10-CM | POA: Diagnosis not present

## 2022-04-30 MED ORDER — FUROSEMIDE 20 MG PO TABS: ORAL_TABLET | ORAL | 3 refills | Status: DC

## 2022-04-30 NOTE — Patient Instructions (Addendum)
Medication Instructions:  INCREASE Lasix to 40 mg DAILY until swelling resolve. Then take 40 mg EVERY OTHER DAY, ALTERNATING 20 mg.  Example: Lasix 20 mg every Monday, Wednesday, Friday, Sunday Lasix 40 mg every Tuesday, Thursday, Saturday  *If you need a refill on your cardiac medications before your next appointment, please call your pharmacy*   Lab Work: Your physician recommends that you return for lab work in 2 weeks (BNP, CMP, Digoxin level)  If you have labs (blood work) drawn today and your tests are completely normal, you will receive your results only by: [place] (if you have MyChart) OR A paper copy in the mail If you have any lab test that is abnormal or we need to change your treatment, we will call you to review the results.   Testing/Procedures: None ordered today   Follow-Up: At [facility], you and your health needs are our priority.  As part of our continuing mission to provide you with exceptional heart care, we have created designated Provider Care Teams.  These Care Teams include your primary Cardiologist (physician) and Advanced Practice Providers (APPs -  Physician Assistants and Nurse Practitioners) who all work together to provide you with the care you need, when you need it.  We recommend signing up for the patient portal called "MyChart".  Sign up information is provided on this After Visit Summary.  MyChart is used to connect with patients for Virtual Visits (Telemedicine).  Patients are able to view lab/test results, encounter notes, upcoming appointments, etc.  Non-urgent messages can be sent to your provider as well.   To learn more about what you can do with MyChart, go to [email or web address].    Your next appointment:   1 month(s)  The format for your next appointment:   In Person  Provider: APP  Your physician recommends that you schedule a follow-up appointment in 3 months with Dr. [name]

## 2022-04-30 NOTE — Progress Notes (Signed)
50,000 patient ID: Janet Nguyen, female   DOB: 09/20/1932, 88 y.o.   MRN: [id number]     Primary  M.D.: Dr. Mardene Sayer  HPI: Janet Nguyen is a 88 y.o. female who presents for a 2 month followup cardiology evaluation.  Janet Nguyen has a history of documented SVT and also has PACs and PVCs  treated with beta blocker therapy. In April 2014 I further titrated her beta blocker therapy after cardiac event monitor revealed several bursts of recurrent SVT up to 177 beats per minute in March.  In July after she had had 2 episodes of chest fluttering which each lasted over 30 minutes which he did take metoprolol tartrate with relief I recommended further titration of her Toprol to 75 mg in the morning and 50 mg at night and if necessary she could further titrate this to 75 twice a day.  She has a history of obstructive sleep apnea but despite multiple attempts at CPAP utilization she has not been able to tolerate this. She was referred to Dr. [name] and has a customized dental appliance with mandibular advancement with improvement in some of her symptomatology. Due concern that her teeth may be moving in more recently she has has not been using customized appliance daily but has been using her old non-customized mouthguard.  At times shen has awakened abruptly from a dream with her heart pounding and a sensation of hot flashes, gasping for breath.   She also states that her scoliosis is getting worse. She does have left-sided musculoskeletal type chest pain due to her spine angulation.  Beause of her significant scoliosis, she feels she must sleep on her back.   She has a history of GERD for which she has been taking Nexium.  She presents for evaluation.  I had scheduled her for an overnight oximetry to see if she is a candidate for supplemental oxygen at nighttime since she refused to use CPAP and only very rarely uses her customized mouthpiece.  She does wear a mouthguard to reduce  bruxism.  Oximetry study was performed overnight on February 23/24.  Her mean oxygen saturation was 93.12%.  She spent 12 minutes and 16 seconds with O2 sat duration below 88% with the lowest O2 sat duration of 80%. I tried to set her up for supplemental oxygen at bedtime.  However, she has been denied for this on multiple occasions by her insurance/Medicare.  She feels that she is sleeping better.  She can only sleep in her back due to scoliosis.  Her left side is concave; her right side is convex.  She has had issues with labile blood pressure. She has had issues with lower back discomfort and sciatica leading to emergency room evaluation in November 2016.  She saw Dr. [name] for neurosurgical evaluation. She has migraine headaches. She  Recently saw Dr. [name] for these migraine headaches.  She has been on Toprol-XL 75 mg twice a day for her palpitations and presently denies any awareness an extra heartbeats. She takes Xanax on an as-needed basis for anxiety.  She has been taking Nexium 20, no grams every other day for GERD. She states her scoliosis is getting worse.  Has been experiencing more fatigue.  She also notes some occasional left hand numbness.  In a mouth guard but no ureteral longer uses her mandibular advancement device and not tolerate CPAP therapy for her obstructive sleep apnea.    She had experienced left-sided chest discomfort which most likely was  related to her significant scoliosis the potential neuropathy causing intermittent left arm and hand numbness.  I slightly reduced her Toprol-XL which she had been taking 150 mg daily and a 75 twice a day regimen to 75 and milligrams in the morning and 50 mg with ultimate plan to decrease this to 50 twice a day.  She states when she reduce this, she began to notice more optical migraines and resume taking the higher dose Toprol at 75 twice a day with improvement.  She has seen Dr. [name] for her paresthesias.  She admits to significant anxiety.   She had requested zolpidem for sleep initiation and maintenance.  In the past, we had tried 6.25 slow-release version to help with sleep maintenance, but due to cost issues preferred the 5 mg sleep initiation dose.  She experiences optical migraine headaches.  She was being seen by neurologist, Dr. Melton Alar  She continues to have issues with her scoliosis causing discomfort in her neck and arm due to her distortion.  He tells me she underwent endoscopy and colonoscopy as well as banding of hemorrhoids.  She had a benign polyp removed.  She has issues with spastic colon.  Her sleep is better with zolpiden 5 mg.  She saw Kerin Ransom on 11/13/2016.  She had noticed that her hair was "thinning" and was concerned about this being the result of Toprol.  He suggested possibly decreasing Toprol but she preferred not until she had seen me.  Since that time, she continues to experience some optical migraines.  She has noticed some leg left neck discomfort.  She denies any patches of hair loss.  She denies differential arm weakness.  She continues to have difficulty with her scoliosis with hip pain and rib cage discomfort.   When I saw her in March 2018, there was a blood pressure differential of 118/78 in the left arm and 140/80 in the right arm.  She subsequently underwent upper extremity Doppler evaluation on 12/25/2016.  She was noted have mild heterogeneous plaque with stable 1-39% bilateral internal carotid stenoses.  She had normal subclavian arteries bilaterally.  There are patent vertebral arteries with antegrade flow.  She did not have any significant blood pressure differential and had triphasic waveforms in both her right and biphasic in her left brachial artery.  Left blood pressure was 8 mm higher than the right brachial pressure.  At times, she continues to experience some vague chest wall symptoms, which most likely related to her posture.  She denies significant shortness of breath.  She has to sleep on  her back because of her scoliosis.  Uses a chin strap to prevent oral breathing.  She is not on CPAP.   In June 2019 she had concerns about some of her medications possibly causing hair loss or memory loss.  She admits to having dry eyes.  She continues to have difficulty from her scolWsaw her in November 2019 at which time she,  felt well from a cardiac standphk and apparently had a new oral appliance made.  She used this initially but then started to notice some teeth discomfort.  Apparently this again was treated by Dr.e still admits to being tired.  She has issues with her abdomen being tight which she believes is related to her scoliosis.  Her GERD is controlled with pantoprazole.    She was seen by Dr. Mardene Sayer for her primary care.  She had developed a fungal infection of her toenail.  He had given  her to [place] fine 20 mg.  She has not started this and was hesitant to do this due to potential interaction with metoprolol.  She also was evaluated by Dr. Brett Fairy.  According to Janet Nguyen she did not evaluate her ophthalmic migraines.  However she was planning to do a possible home study to reassess her sleep apnea.  In the past she did not tolerate any CPAP therapy and although initially tolerated customized oral appliance this seemed to cause difficulty with movement of her teeth.  She has been using the CALM app on her smart phone to help with relaxation and improve sleep and is chronically dependent on low-dose zolpidem prescribed by Dr. Harrington Challenger.  She denies chest pain.  Her palpitations are well controlled with her current dose of metoprolol 75 mils twice a day and she can to be on low-dose amlodipine 2.5 mg.    Since I saw her in June 2020, she underwent the home study by Dr. Theodoro Clock was done after several studies with no data.  According to Dr. Edwena Felty note, AHI was 26.5 which was moderate and during REM sleep AHI rose to 34.9.  Snoring was not recorded.  Apparently there was discussion of  possible Financial planner.  Janet Nguyen is not interested.  I saw her in October 2020 at which time she had continued issues with migraines and some vertigo for which he takes the Epley maneuver.  She continued to have discomfort related to her scoliosis with muscle discomfort on her chest.  She believes she is waking up during dreaming and not sleeping well.  Her blood pressure has increased.  At her prior evaluation, I had instituted low-dose amlodipine at 2.5 mg.  With her blood pressure elevated during that evaluation I further titrated this to 5 mg I recommended she continue taking Toprol XL 75 mg twice a day.  She did not have any episodes of palpitations or recurrent SVT.  I saw her in January 2021. She had seen  Dr. Jaynee Eagles of neurology.  She continued to experience some muscular neck aches which may be related to her scoliosis but do not sound ischemic in etiology.  She states she has some varicose veins in the right lower extremity and complains of trivial ankle swelling.  She denies palpitations.    I saw her in April 2021.  Over the previous several months she had felt well from a cardiovascular standpoint.  She continues to have issues with her scoliosis causing back issues as well as some left-sided discomfort along her rib cage.   She has sleep apnea and remotely did not tolerate CPAP or an oral appliance  She continues to be on amlodipine 5 mg daily, metoprolol succinate 75 mg twice a day both for blood pressure and her palpitations. GERD is fairly well controlled on pantoprazole. She continues to be on Librax which she states does offer improvement and she takes alprazolam on a as needed basis.  I saw her in October 2021 and since her prior evaluation she had undergone neurology evaluation by Dr. Tomi Likens and has had migraine issues with aura since young adulthood.  An MRI of the brain in 2017 showed mild chronic small vessel ischemic changes otherwise was unremarkable.  She had developed  swelling in her leg right greater than left and has varicose veins.  She underwent lower extremity venous study which revealed no DVT but there was evidence for venous reflux in the right saphenofemoral junction, right greater saphenous vein in the thigh  and right greater saphenous vein in the calf.  She has used compression stockings in the past.  There was no plan for intervention.  She has been undergoing physical therapy.  There is no change in her intermittent rib discomfort from her scoliosis.  She denies palpitations.    I saw her in May 2022.  Over the prior 6 months she continued to have issues with her rib cage and hip bone discomfort contributed by her scoliosis.  She has had issues with varicose veins.  She has to sleep on her side.  She was recently evaluated by Dr. Earlie Server for elevated monocytes who felt she was stable hematologically.  She denies any angina.  She is unaware of palpitations and continues to be on Toprol all succinate 75 mg twice a day.  She is also on amlodipine 5 mg for hypertension.  She has a prescription for alprazolam and rarely if ever takes this for anxiety.    When I saw her on October 10, 2021 she was continuing to have issues resulting from her scoliosis.  She was recently evaluated at [facility] and a chest CT revealed scattered areas of focal bronchiectasis and clustered nodules in the lower lungs, most pronounced in the right middle lobe and lingula.  There also was a part solid nodule of the right lower lobe measuring 7.5 mm in mean diameter with 4 mm solid component.  A follow-up noncontrast CT was recommended in 3 to 6 months.  She was experiencing increasing shortness of breath with activity and denied any anginal symptoms.  Since I saw her, she was evaluated by Dr. Chase Caller in follow-up of her chest CT for pulmonary evaluation of her bronchiectasis and shortness of breath.  Laboratory revealed an elevated D-dimer.  Subsequent VQ scan on October 25, 2021 was positive for PE.  A 2D echo Doppler study showed mildly elevated pulmonary artery pressure 38 mm with grade 2 diastolic dysfunction, severe left atrial enlargement with moderate right atrial enlargement, and moderately severe MR.  Venous Doppler was negative for DVT.  She was started on Eliquis and Spiriva.  She had pulmonary follow-up on February 3 and was last seen by Dr. Chase Caller on November 28, 2021.  She was hospitalized on March 6 through December 04, 2021 with increased palpitations and was found to be in atrial fibrillation.  On presentation sodium was 126 potassium 5.2.  Troponins were negative.  Chest x-ray showed cardiomegaly with bilateral atelectasis and small bilateral pleural effusions.  She was started on IV diuresis.  She was on beta-blocker with metoprolol succinate 75 mg and diltiazem was added for rate control.  Follow-up echo showed EF 60 to 65% with moderate dilation of bilateral atriums.  There is a small circumferential pericardial effusion.  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  During hospitalization she was seen by Dr. Johney Frame of cardiology.  She was discharged on December 04, 2021.  I saw her in follow-up of her hospitalization on December 18, 2021.  At that time, she felt somewhat improved.  However, her ECG revealed atrial fibrillation at 94 bpm.  She was less tachycardic.  She continues to have issues with her scoliosis.  She is on medical regimen of Eliquis 5 mg twice a day, metoprolol succinate 75 mg daily, diltiazem CD1 120 mg daily, furosemide 20 mg, as well as Spiriva.  During that evaluation, I recommended  further titration of her metoprolol succinate to 100 mg daily and that she take this in a 50 twice daily regimen for more stable blood pressure.  She continues to be on Eliquis for anticoagulation.  I schedule her for outpatient cardioversion.  Janet Nguyen  underwent DC cardioversion by Dr. Johney Frame on January 08, 2022.  She was successfully cardioverted.  Subsequently, she saw Dr. Chase Caller on January 24, 2022.  She apparently was to be on Spiriva and the flutter valve but apparently was not doing this.  I saw her for follow-up on Jan 29, 2022.  At that time she stated her blood pressure had been typically in the 092-330 systolic range.  She had noticed heart rate irregularity for the last 1 to 2 weeks.  Her ECG revealed that she is back in atrial fibrillation with coarse fibrillatory waves with ventricular rate at 97.  At that time I discussed options with the patient.  With her age of 88 years old I recommended further titration of diltiazem to 180 mg daily from her present dose of 120 mg.  I did not feel she was a candidate for amiodarone in light of her recent pulmonary history and recommended she be evaluated in the atrial fibrillation clinic for consideration of possible initiation of other antiarrhythmic treatment.  She was scheduled to have a follow-up chest CT in the future with Dr. Chase Caller.  She was evaluated by Malka So on Feb 04, 2022.  At that time there was significant discussion concerning possible admission for dofetilide and reportedly she was agreeable.  He recommended that she continue diltiazem 180 mg daily, Toprol 50 mg twice a day, and Eliquis 5 mg twice a day.  Apparently, subsequently Janet Nguyen had real concerns about the hospitalization and canceled the tentatively scheduled admission.    I saw her on March 03, 2022 for follow-up cardiology evaluation.  She had reduced her diltiazem back to the 120 mg dose since she felt the 180 mg dose was causing stomach upset.  She admitted to be under significant stress trying to figure out all her health issues.  She had significant reservations about Tikosyn loading.  During that evaluation we discussed options and felt with her age rate control strategy may be most appropriate.  However in order to  improve heart rate response I added digoxin at 0.0625 mg.  Since I saw her, she underwent pulmonary evaluation with Dr. Chase Caller.  She was not satisfied with that appointment and remains frustrated.  Her CT scan had been done which showed improvement in prior lung nodule.  She was using flutter valve for her bronchiectasis.  Presently, she denies any chest pain.  She has had issues with leg swelling right greater than left and has issues with varicose veins.  With her progressive scoliosis she notes some discomfort from compression of her stomach.  She admits to some itching.  She still experiences some shortness of breath.  Her CT scan did not show any recurrent PE a pulmonary trunk was enlarged indicative of pulmonary artery hypertension.  Laboratory from July 20 showed BNP elevated at 870.  D-dimer was mildly increased at 0.73, improved from January 2023.  She presents for evaluation.   Past Medical History:  Diagnosis Date   Arrhythmia    History of SVT with documented PVC'S and  PAC'S  12/08/12 Nuc stress test normal LV EF 74%  Event Monitor  12/01/12-01/03/13   Atrial flutter (HCC)    Celiac disease    treated by  Dr. 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 Marijean Bravo; pt states she was diagnosed 10-15 years ago, not treated at this time or any issues that she is aware of.   Scoliosis    Scoliosis    Sleep apnea 10/02/11 [facility]   Sleep study AHI -total sleep 10.3/hr  64.0/ hr during REM sleep.RDI 22.8/hr during total sleep 64.0/hr during REM sleep The lowest O2 sat during Non-REM and REM sleep was 86% and 88% respectively. 04/08/12 CPAP/BIPAP titration study [facility]    Past Surgical History:  Procedure Laterality Date   APPENDECTOMY     ruptured  at age 61 and had surgery   CARDIAC CATHETERIZATION  01/27/06   CARDIOVERSION N/A 01/08/2022   Procedure: CARDIOVERSION;  Surgeon: Freada Bergeron, MD;  Location: [facility] ENDOSCOPY;  Service: Cardiovascular;  Laterality: N/A;   cataract surgery  2015   Dr. Herbert Deaner; March & April 2015    Allergies  Allergen Reactions   Amoxicillin-Pot Clavulanate     Has never had, son allergic  Other reaction(s): Vomiting   Gluten Meal Other (See Comments)    Unknown   Naproxen Other (See Comments)    Stomach upset    Current Outpatient Medications  Medication Sig Dispense Refill   acetaminophen (TYLENOL) 500 MG tablet Take 500 mg by mouth every 8 (eight) hours as needed for moderate pain.     Aloe-Sodium Chloride (AYR SALINE NASAL GEL NA) Place 1 application. into the nose daily as needed (rhinitis).     ALPRAZolam (XANAX) 0.5 MG tablet Take 0.25-0.5 mg by mouth 2 (two) times daily as needed for anxiety.     apixaban (ELIQUIS) 5 MG TABS tablet Take 1 tablet (5 mg total) by mouth 2 (two) times daily. 56 tablet 0   Biotin w/ Vitamins C & E (HAIR SKIN & NAILS GUMMIES PO) Take 1 tablet by mouth daily.     Calcium Carb-Ergocalciferol (CHEWABLE CALCIUM/D PO) Take 1 each by mouth daily.     clidinium-chlordiazePOXIDE (LIBRAX) 5-2.5 MG capsule Take 1 capsule by mouth daily as needed. 60 capsule 3   digoxin (LANOXIN) 0.125 MG tablet Take 0.5 tablets (0.0625 mg total) by mouth daily. 15 tablet 3   diltiazem (CARDIZEM CD) 120 MG 24 hr capsule Take 1 capsule (120 mg total) by mouth every morning.     fluticasone (FLONASE) 50 MCG/ACT nasal spray Place 1 spray into both nostrils daily as needed for allergies or rhinitis.     metoprolol succinate (TOPROL-XL) 50 MG 24 hr tablet TAKE 1 AND 1/2 TABLETS BY MOUTH TWO TIMES A DAY 270 tablet 0   Omega-3 Fatty Acids ([employer] OMEGA-3 GUMMY FISH PO) Take 1 each by mouth daily.     Phenylephrine-APAP-guaiFENesin (EQ SINUS CONGESTION & PAIN PO) Take 1 tablet by mouth daily as  needed (for sinus pain). Contains acetaminophen 325 mg and Phenylephrine 5 mg     polyethylene glycol powder (GLYCOLAX/MIRALAX) 17 GM/SCOOP powder Take 17 g by mouth daily as needed for moderate constipation.     Tiotropium Bromide Monohydrate (SPIRIVA RESPIMAT) 1.25 MCG/ACT AERS Inhale 2 puffs into the lungs daily. 4 g 5   Tiotropium Bromide Monohydrate (SPIRIVA RESPIMAT) 1.25 MCG/ACT AERS Inhale 2 puffs into the lungs daily.  4 g 5   Tiotropium Bromide Monohydrate (SPIRIVA RESPIMAT) 1.25 MCG/ACT AERS Inhale 2 puffs into the lungs daily. 4 g 0   traMADol (ULTRAM) 50 MG tablet Take 50 mg by mouth 2 (two) times daily as needed for severe pain.     zolpidem (AMBIEN) 5 MG tablet Take 2.5 mg by mouth at bedtime.     furosemide (LASIX) 20 MG tablet Take 20 mg (1 tablet) every Monday, Wednesday, Friday, Sunday, then 40 mg (2 tablet) every Tuesday, Thursday, Saturday. 90 tablet 3   potassium chloride (KLOR-CON M) 10 MEQ tablet Take 1 tablet (10 mEq total) by mouth daily. 90 tablet 3   No current facility-administered medications for this visit.    Socially she is divorced has 4 children 9 grandchildren. She does exercise. No tobacco use. She does occasional wine.  ROS General: Negative; No fevers, chills, or night sweats;  HEENT: Negative; No changes in vision or hearing, sinus congestion, difficulty swallowing Pulmonary: Recent unprovoked PE, bronchiectasis Cardiovascular: See HPI GI: Negative; No nausea, vomiting, diarrhea, or abdominal pain GU: Negative; No dysuria, hematuria, or difficulty voiding Musculoskeletal: Positive for significant scoliosis; fibromyalgia;  joint pain, or weakness Hematologic/Oncology: Negative; no easy bruising, bleeding Endocrine: Negative; no heat/cold intolerance; no diabetes Neuro: History of migraine headaches Skin: Positive for fungal infection of her toenail Psychiatric: Negative; No behavioral problems, depression Sleep: Positive for sleep apnea ; No snoring,  daytime sleepiness, hypersomnolence, bruxism, restless legs, hypnogognic hallucinations, no cataplexy Other comprehensive 14 point system review is negative.  PE BP (!) 140/82   Pulse 86   Ht 5' 1"  (1.549 m)   Wt 121 lb 9.6 oz (55.2 kg)   SpO2 99%   BMI 22.98 kg/m    Repeat blood pressure by me 114/80  Wt Readings from Last 3 Encounters:  04/30/22 121 lb 9.6 oz (55.2 kg)  04/17/22 119 lb 6.4 oz (54.2 kg)  03/03/22 121 lb 6.4 oz (55.1 kg)   General: Alert, oriented, no distress.  Skin: normal turgor, no rashes, warm and dry HEENT: Normocephalic, atraumatic. Pupils equal round and reactive to light; sclera anicteric; extraocular muscles intact;  Nose without nasal septal hypertrophy Mouth/Parynx benign; Mallinpatti scale 2 Neck: No JVD, no carotid bruits; normal carotid upstroke Lungs: Decreased breath sounds at bases Chest wall: without tenderness to palpitation Heart: PMI not displaced, RRR, s1 s2 normal, 1/6 systolic murmur, no diastolic murmur, no rubs, gallops, thrills, or heaves Abdomen: soft, nontender; no hepatosplenomehaly, BS+; abdominal aorta nontender and not dilated by palpation. Back: Scoliosis Pulses 2+ Musculoskeletal: full range of motion, normal strength, no joint deformities Extremities: 2-3+ pitting edema ankles and distal leg right greater than left with varicose veins.  No clubbing cyanosis, Homan's sign negative  Neurologic: grossly nonfocal; Cranial nerves grossly wnl Psychologic: Normal mood and affect    April 30, 2022 ECG (independently read by me): Atrial fibrillation at 86, LAHB, PRWP   March 03, 2022 ECG (independently read by me): Atrial fibrillation at 97, QS V1-3  Jan 29, 2022 ECG (independently read by me): Probable A fib with coarse fibrilatory waves at 97 bpm vs A flutter with variable block; QTc 441  December 19, 2021 ECG (independently read by me):  Atrial fibrillation at 94, LAHB    October 10, 2021 ECG (independently read by me): Sinus  bradycardia at 59,   Feb 13, 2021 ECG (independently read by me): Sinus bradycardia at 59 with mild arhythmia  October 2021 ECG (independently read by me): Sinus rhythm at  67 with mild arrythmia; norma intervals  April 2021 ECG (independently read by me): Normal sinus rhythm at 69 bpm. No ectopy. Normal intervals.  October 18, 2019 ECG (independently read by me): Sinus bradycardia 59 bpm.  QS complex V1 V2.  Normal intervals.  No ectopy  October 2020 ECG (independently read by me): Normal sinus rhythm at 63 bpm.  QS complex V1 V2.  No ectopy.  Normal intervals.  June 2020 ECG (independently read by me): NSR at 62; no ectopy; normal intervals   November 2019 ECG (independently read by me): Normal sinus rhythm with PAC.  Ventricular rate 66.  QS V1 V2.  Mild T wave abnormality  June 2019 ECG (independently read by me): Normal sinus rhythm at 64 bpm.  Normal intervals.  No ectopy.  September 2018 ECG (independently read by me): Normal sinus rhythm at 64 bpm.  Isolated PAC.  QTc interval 414 ms.  QS V1 V2, unchanged.  May 2018 ECG (independently read by me): Normal sinus rhythm at 65 bpm.  QS V1, V2.  Normal intervals.  No ST segment changes.  March 2018 ECG (independently read by me): Normal sinus rhythm at 65 bpm.  No ectopy.  Normal intervals.  September 2017 ECG (independently read by me): Normal sinus rhythm at 62 bpm.  QS V1 and V2.  Normal intervals.  April 2017 ECG (independently read by me): Normal sinus rhythm at 70 bpm.  No ectopy.  PR interval 158 ms and QTc interval 414 ms.  March 2017 ECG (independently read by me): normal sinus rhythm at 61 bpm..  No ST segment changes.  Normal intervals.  QTc interval 396 ms.  August 2014 ECG (independently read by me): Normal sinus rhythm at 63 bpm.  QRS couplets V1 V2.  No cigarette ST-T changes.  May 2016 ECG (independently read by me): Sinus bradycardia 59 bpm.  No ectopy.  February 2016 ECG (independently read by me): Sinus  bradycardia 56 bpm.  Normal intervals.  Prior ECG (independently read by me): Normal sinus rhythm at 62 beats per minute.  QTc interval 411 milliseconds.  QRS complex V1, V2.  Normal intervals.  ECG (independently read by me): Normal sinus rhythm at 62 beats per minute. Normal intervals. QTc interval 399 ms.  Prior ECG of 07/13/2013: Sinus rhythm at 61 beats per minute. QTc interval 406 ms. PR interval normal at 168 ms.  LABS:    Latest Ref Rng & Units 01/29/2022    4:06 PM 01/02/2022   11:17 AM 12/04/2021    5:37 AM  BMP  Glucose 70 - 99 mg/dL 100  95  95   BUN 8 - 27 mg/dL 20  16  12    Creatinine 0.57 - 1.00 mg/dL 1.05  1.01  1.13   BUN/Creat Ratio 12 - 28 19  16     Sodium 134 - 144 mmol/L 133  129  135   Potassium 3.5 - 5.2 mmol/L 4.8  4.9  4.1   Chloride 96 - 106 mmol/L 95  91  97   CO2 20 - 29 mmol/L 23  CANCELED  28   Calcium 8.7 - 10.3 mg/dL 9.8  9.8  9.4       Latest Ref Rng & Units 01/29/2022    4:06 PM 12/02/2021    5:52 PM 02/11/2021   11:37 AM  Hepatic Function  Total Protein 6.0 - 8.5 g/dL 7.6  6.4  7.0   Albumin 3.6 - 4.6 g/dL 4.6  3.8  3.9  AST 0 - 40 IU/L 28  45  21   ALT 0 - 32 IU/L 24  17  10    Alk Phosphatase 44 - 121 IU/L 69  52  49   Total Bilirubin 0.0 - 1.2 mg/dL 0.5  1.7  0.6       Latest Ref Rng & Units 04/17/2022    2:07 PM 01/29/2022    4:06 PM 01/02/2022   11:17 AM  CBC  WBC 4.0 - 10.5 K/uL 5.8  7.3  8.3   Hemoglobin 12.0 - 15.0 g/dL 12.6  13.7  13.2   Hematocrit 36.0 - 46.0 % 38.7  41.4  39.7   Platelets 150.0 - 400.0 K/uL 205.0  296  258    Lab Results  Component Value Date   TSH 0.980 01/29/2022  Lipid Panel     Component Value Date/Time   CHOL 154 12/03/2016 0845   TRIG 81 12/03/2016 0845   HDL 67 12/03/2016 0845   CHOLHDL 2.3 12/03/2016 0845   VLDL 16 12/03/2016 0845   LDLCALC 71 12/03/2016 0845     RADIOLOGY:  CT CHEST 10/09/2021 IMPRESSION: 1. 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 Chronic aspiration could have a similar appearance. 2. Part solid nodule of the right lower lobe measuring 7.5 mm in mean diameter with 4 mm solid component. Follow-up non-contrast CT recommended at 3-6 months to confirm persistence. If unchanged, and solid component remains <6 mm, annual CT is recommended until 5 years of stability has been established. If persistent these nodules should be considered highly suspicious if the solid component of the nodule is 6 mm or greater in size and enlarging. This recommendation follows the consensus statement: Guidelines for Management of Incidental Pulmonary Nodules Detected on CT Images: From the Fleischner Society 2017; Radiology 2017; 284:228-243. 3.  Aortic Atherosclerosis (ICD10-I70.0).     ECHO: 12/03/2021  1. Left ventricular ejection fraction, by estimation, is 60 to 65%. Left  ventricular ejection fraction by 2D MOD biplane is 60.8 %. The left  ventricle has normal function. The left ventricle has no regional wall  motion abnormalities. There is mild left  ventricular hypertrophy. Left ventricular diastolic function could not be  evaluated.   2. Left atrial size was moderately dilated.   3. Right atrial size was moderately dilated.   4. The pericardial effusion is circumferential. There is no evidence of  cardiac tamponade.   5. The mitral valve is myxomatous. Moderate mitral valve regurgitation.  There is moderate late systolic prolapse of the middle scallop of the  posterior leaflet of the mitral valve.   6. Tricuspid valve regurgitation is moderate.   7. The aortic valve is tricuspid. Aortic valve sclerosis is present, with  no evidence of aortic valve stenosis.   8. There is normal pulmonary artery systolic pressure. The estimated  right ventricular systolic pressure is 72.6  mmHg.   9. The inferior vena cava is dilated in size with >50% respiratory  variability, suggesting right atrial pressure of 8 mmHg.   Comparison(s): Changes from prior study are noted. 10/15/2021: LVEF 63%,  moderate to severe MR with posterior leaflet prolapse, grade 2 DD.   IMPRESSION:  1. Persistent atrial fibrillation ([place])   2. Bilateral lower extremity edema   3. SOB (shortness of breath)   4. Anticoagulated  5. Bronchiectasis without complication ([place])   6. OSA (obstructive sleep apnea): Untreated   7. Medication management   8. History of pulmonary embolism   9. Other idiopathic scoliosis, thoracolumbar region     ASSESSMENT AND PLAN: Janet Nguyen is an 88 year old female who has a remote history of SVT and PACs/PVCs which were controlled with beta blocker therapy. An echo in March 0177  normal systolic function with grade 1 diastolic dysfunction and had significant left atrial dilatation and mild dilatation of the right ventricle with moderate dilatation of the right atrium. She had mild/moderate pulmonary hypertension with PA pressures of 42 mm. An echo in October 2018 showed an EF of 55 to 60% with grade 1 diastolic dysfunction, mitral annular calcification with mild MR, and mild pulmonary hypertension with PA pressure 34 mm. Her left atrium was mild to moderately dilated.  She had venous reflux on lower extremity reflux evaluation which did not show any evidence of DVT in October 2021.  A chest CT in January 2023 was done for increasing shortness of breath and revealed probable reactive mediastinal lymph nodes, scattered focal areas of bronchiectasis and clustered nodules in the lower lobes as well as a solitary nodule in the right lower lobe at 7.5 mm with a 4 mm solid component.  She subsequently was found to have pulmonary embolism on January 23 and was started on Eliquis anticoagulation in addition to Spiriva for her shortness of breath by Dr. Chase Caller.  She was hospitalized  with atrial fibrillation with RVR on December 02, 2021.  Her echo Doppler study at that time showed an EF of 60 to 65% with mild LVH there was moderate biatrial enlargement, small circumferential pericardial effusion and she had moderate late systolic prolapse of the middle scallop of the posterior leaflet of the mitral valve resulting in moderate mitral regurgitation.  There is aortic sclerosis.  When I saw her in March 2023 she continued to be in atrial fibrillation at that time metoprolol succinate was further titrated to 100 mg daily but I recommended she take 50 mg twice a day for more blood pressure stability.  She underwent successful cardioversion on January 08, 2022 performed by Dr. Johney Frame.  She was found to be in persistent atrial fibrillation on subsequent evaluation at which time her diltiazem dose was increased to 180 mg and she was referred to atrial fibrillation clinic.  She is not a candidate for amiodarone due to her lung disease.  It was advised that she consider admission for Tikosyn initiation at low-dose but she canceled the admission.  When I saw her when I saw her in June 2023 we had a lengthy discussion and she had reduced her dose of diltiazem down to 120 mg.  She did not feel confident about pursuing Tikosyn and her decision was to attempt rate control.  I added digoxin 0.0625 mg for additional rate control.  A subsequent CT scan was done and she has seen Dr. Chase Caller.  She has bronchiectasis.  Her previous lung nodule had improved.  She has developed progressive lower extremity edema right greater than left.  She has been taking furosemide 20 mg daily.  With her significant edema I have suggested that she increase furosemide to 40 mg in the morning until the edema improves and then she can try alternating 20 with 40 mg every other day.  Her most recent BNP was elevated consistent with volume overload.  Her ECG today confirms atrial fibrillation with ventricular rate now in  the 80s compared  with upper 90s prior to digoxin initiation.  She is on Spiriva.  She continues to have issues with anxiety.  I have suggested support stockings.  In 2 weeks she will undergo a comprehensive metabolic panel, follow-up BMP and I will check a dig level.  In 1 month I recommended she see our APP and I will see her in 2 to 3 months for cardiology evaluation.     Troy Sine, MD, [facility]  05/02/2022 3:05 PM

## 2022-05-02 ENCOUNTER — Encounter: Payer: Self-pay | Admitting: Cardiovascular Disease

## 2022-05-05 ENCOUNTER — Telehealth: Payer: Self-pay | Admitting: Internal Medicine

## 2022-05-06 MED ORDER — SPIRIVA RESPIMAT 1.25 MCG/ACT IN AERS: 2.0000 | INHALATION_SPRAY | Freq: Every day | RESPIRATORY_TRACT | 5 refills | Status: DC

## 2022-05-06 NOTE — Telephone Encounter (Signed)
Called and spoke to pt. Pt states she needs a new script for Spiriva. Rx sent to pharmacy and advised her to call them in the morning to see how much it cost. There were two previous Spiriva scripts that were sent to the [facility] and pt states she was never notified. Pt states she will call Kristopher Oppenheim on 8/9. Nothing further needed at this time.

## 2022-05-07 DIAGNOSIS — K644 Residual hemorrhoidal skin tags: Secondary | ICD-10-CM | POA: Diagnosis not present

## 2022-05-07 DIAGNOSIS — N3001 Acute cystitis with hematuria: Secondary | ICD-10-CM | POA: Diagnosis not present

## 2022-05-12 ENCOUNTER — Ambulatory Visit: Payer: Medicare Other | Admitting: Cardiovascular Disease

## 2022-05-14 DIAGNOSIS — Z79899 Other long term (current) drug therapy: Secondary | ICD-10-CM | POA: Diagnosis not present

## 2022-05-14 DIAGNOSIS — R0602 Shortness of breath: Secondary | ICD-10-CM | POA: Diagnosis not present

## 2022-05-15 LAB — COMPREHENSIVE METABOLIC PANEL
ALT: 21 IU/L (ref 0–32)
AST: 30 IU/L (ref 0–40)
Albumin/Globulin Ratio: 1.7 (ref 1.2–2.2)
Albumin: 4.5 g/dL (ref 3.7–4.7)
Alkaline Phosphatase: 82 IU/L (ref 44–121)
BUN/Creatinine Ratio: 17 (ref 12–28)
BUN: 17 mg/dL (ref 8–27)
Bilirubin Total: 0.9 mg/dL (ref 0.0–1.2)
CO2: 24 mmol/L (ref 20–29)
Calcium: 9.7 mg/dL (ref 8.7–10.3)
Chloride: 96 mmol/L (ref 96–106)
Creatinine, Ser: 1.01 mg/dL — ABNORMAL HIGH (ref 0.57–1.00)
Globulin, Total: 2.7 g/dL (ref 1.5–4.5)
Glucose: 92 mg/dL (ref 70–99)
Potassium: 4.6 mmol/L (ref 3.5–5.2)
Sodium: 133 mmol/L — ABNORMAL LOW (ref 134–144)
Total Protein: 7.2 g/dL (ref 6.0–8.5)
eGFR: 54 mL/min/{1.73_m2} — ABNORMAL LOW (ref >59)

## 2022-05-15 LAB — BRAIN NATRIURETIC PEPTIDE: BNP: 515.3 pg/mL — ABNORMAL HIGH (ref 0.0–100.0)

## 2022-05-19 DIAGNOSIS — N760 Acute vaginitis: Secondary | ICD-10-CM | POA: Diagnosis not present

## 2022-05-19 DIAGNOSIS — R319 Hematuria, unspecified: Secondary | ICD-10-CM | POA: Diagnosis not present

## 2022-05-30 DIAGNOSIS — Z8739 Personal history of other diseases of the musculoskeletal system and connective tissue: Secondary | ICD-10-CM | POA: Diagnosis not present

## 2022-05-30 DIAGNOSIS — I10 Essential (primary) hypertension: Secondary | ICD-10-CM | POA: Diagnosis not present

## 2022-06-01 NOTE — Progress Notes (Signed)
Cardiology Office Note:    Date:  06/05/2022   ID:  Janet Nguyen, DOB [birth year], MRN [id number]  PCP:  Lawerance Cruel, [place] Providers Cardiologist:  Shelva Majestic, MD Cardiology APP:  Ledora Bottcher, [place] { Referring MD: Lawerance Cruel, MD   Chief Complaint  Patient presents with   Follow-up  swelling  History of Present Illness:    Janet Nguyen is a 88 y.o. female with a hx of SVT, PACs, PVCs treated with beta-blocker therapy, OSA not on CPAP, scoliosis, GERD, optical migraines, mild nonobstructive carotid artery disease, and chronic chest pain related to her posture from scoliosis.  She was referred to lumbar pulmonology for bronchiectasis and shortness of breath.  VQ scan showed positive PE 10/25/2021.  She was started on Eliquis and Spiriva.  She was diagnosed with atrial fibrillation during a hospitalization in March 2023.  Echocardiogram at that time revealed LVEF 60-65% moderate biatrial enlargement, and small circumferential pericardial effusion.  She was seen in follow-up in May 2023 with Dr. [name] and noted to be back in A-fib.  Cardizem was increased to 180 mg daily and metoprolol 75 mg twice daily was continued.  Given her pulmonary history, she is felt to not be a candidate for amiodarone.  She was referred to A-fib clinic.  Tikosyn load was planned but she called back and canceled the scheduled hospital admission.  She also self reduced her Cardizem to 120 mg due to questionable stomach upset.  In follow-up, rate control strategy was decided upon and digoxin was added to her regimen.  She has also seen Dr. Chase Caller back and has not been satisfied with her appointments.  CT scan did show enlarged pulmonary artery suggestive of pulmonary artery hypertension.  BNP in March was 640, pro-BNP 03/2022 was 870.  Due to bilateral lower extremity edema, Dr. [name] increased her dose of Lasix from 20 mg to 40 mg every other day.  She presents today for  1 month follow-up. LE swelling has improved, but still significant edema in right lower leg. She is still taking 40 mg lasix daily and I agree with this.   Her weight fluctuates between 113-119 lbs. She is frustrated that she is no longer able to do yoga in the floor. She is not currently bothered by palpitations. She typically has palpitations at night when going to bed.    Past Medical History:  Diagnosis Date   Arrhythmia    History of SVT with documented PVC'S and  PAC'S  12/08/12 Nuc stress test normal LV EF 74%  Event Monitor  12/01/12-01/03/13   Atrial flutter ([place])    Celiac disease    treated by Dr. 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 Marijean Bravo; pt states she was diagnosed 10-15 years ago, not treated at this time or any issues that she is aware of.   Scoliosis    Scoliosis    Sleep apnea 10/02/11 [facility]   Sleep study AHI -total sleep 10.3/hr  64.0/ hr during REM sleep.RDI 22.8/hr during total sleep 64.0/hr during REM sleep The lowest O2 sat during Non-REM and REM sleep was 86% and 88% respectively. 04/08/12 CPAP/BIPAP titration study [facility]  Past Surgical History:  Procedure Laterality Date   APPENDECTOMY     ruptured at age 83 and had surgery   CARDIAC CATHETERIZATION  01/27/06   CARDIOVERSION N/A 01/08/2022   Procedure: CARDIOVERSION;  Surgeon: Freada Bergeron, MD;  Location: [facility] ENDOSCOPY;  Service: Cardiovascular;  Laterality: N/A;   cataract surgery  2015   Dr. Herbert Deaner; March & April 2015    Current Medications: Current Meds  Medication Sig   acetaminophen (TYLENOL) 500 MG tablet Take 500 mg by mouth every 8 (eight) hours as needed for moderate pain.   Aloe-Sodium Chloride (AYR SALINE NASAL GEL NA) Place 1  application. into the nose daily as needed (rhinitis).   ALPRAZolam (XANAX) 0.5 MG tablet Take 0.25-0.5 mg by mouth 2 (two) times daily as needed for anxiety.   apixaban (ELIQUIS) 2.5 MG TABS tablet Take 1 tablet (2.5 mg total) by mouth 2 (two) times daily.   Biotin w/ Vitamins C & E (HAIR SKIN & NAILS GUMMIES PO) Take 1 tablet by mouth daily.   Calcium Carb-Ergocalciferol (CHEWABLE CALCIUM/D PO) Take 1 each by mouth daily.   clidinium-chlordiazePOXIDE (LIBRAX) 5-2.5 MG capsule Take 1 capsule by mouth daily as needed.   digoxin (LANOXIN) 0.125 MG tablet Take 0.5 tablets (0.0625 mg total) by mouth daily.   diltiazem (CARDIZEM CD) 120 MG 24 hr capsule Take 1 capsule (120 mg total) by mouth every morning.   fluticasone (FLONASE) 50 MCG/ACT nasal spray Place 1 spray into both nostrils daily as needed for allergies or rhinitis.   furosemide (LASIX) 20 MG tablet Take 20 mg (1 tablet) every Monday, Wednesday, Friday, Sunday, then 40 mg (2 tablet) every Tuesday, Thursday, Saturday.   Phenylephrine-APAP-guaiFENesin (EQ SINUS CONGESTION & PAIN PO) Take 1 tablet by mouth daily as needed (for sinus pain). Contains acetaminophen 325 mg and Phenylephrine 5 mg   polyethylene glycol powder (GLYCOLAX/MIRALAX) 17 GM/SCOOP powder Take 17 g by mouth daily as needed for moderate constipation.   Tiotropium Bromide Monohydrate (SPIRIVA RESPIMAT) 1.25 MCG/ACT AERS Inhale 2 puffs into the lungs daily.   zolpidem (AMBIEN) 5 MG tablet Take 2.5 mg by mouth at bedtime.   [DISCONTINUED] apixaban (ELIQUIS) 5 MG TABS tablet Take 1 tablet (5 mg total) by mouth 2 (two) times daily.   [DISCONTINUED] metoprolol succinate (TOPROL-XL) 50 MG 24 hr tablet TAKE 1 AND 1/2 TABLETS BY MOUTH TWO TIMES A DAY     Allergies:   Gluten meal and Naproxen   Social History   Socioeconomic History   Marital status: Divorced    Spouse name: Not on file   Number of children: 4   Years of education: college    Highest education level: Not on  file  Occupational History   Occupation: retired   Tobacco Use   Smoking status: Never   Smokeless tobacco: Never   Tobacco comments:    Never smoke 12/12/21  Vaping Use   Vaping Use: Never used  Substance and Sexual Activity   Alcohol use: Yes    Alcohol/week: 3.0 - 4.0 standard drinks of alcohol    Types: 3 - 4 Glasses of wine per week    Comment: 1 glass of wine 3-4 times weekly 12/12/21   Drug use: No   Sexual activity: Not on file  Other Topics Concern   Not on file  Social History Narrative   Lives alone at home   Drinks tea but tries to drink decaf   Has 9 grandchildren    Right handed  Social Determinants of Health   Financial Resource Strain: Low Risk  (12/03/2021)   Overall Financial Resource Strain (CARDIA)    Difficulty of Paying Living Expenses: Not hard at all  Food Insecurity: No Food Insecurity (12/03/2021)   Hunger Vital Sign    Worried About Running Out of Food in the Last Year: Never true    Ran Out of Food in the Last Year: Never true  Transportation Needs: No Transportation Needs (12/03/2021)   PRAPARE - Hydrologist (Medical): No    Lack of Transportation (Non-Medical): No  Physical Activity: Not on file  Stress: Not on file  Social Connections: Not on file     Family History: The patient's family history includes Breast cancer in her mother; Heart disease in her father. There is no history of Migraines.  ROS:   Please see the history of present illness.     All other systems reviewed and are negative.  EKGs/Labs/Other Studies Reviewed:    The following studies were reviewed today:  Echo 12/03/21:  1. Left ventricular ejection fraction, by estimation, is 60 to 65%. Left  ventricular ejection fraction by 2D MOD biplane is 60.8 %. The left  ventricle has normal function. The left ventricle has no regional wall  motion abnormalities. There is mild left  ventricular hypertrophy. Left ventricular diastolic function could  not be  evaluated.   2. Left atrial size was moderately dilated.   3. Right atrial size was moderately dilated.   4. The pericardial effusion is circumferential. There is no evidence of  cardiac tamponade.   5. The mitral valve is myxomatous. Moderate mitral valve regurgitation.  There is moderate late systolic prolapse of the middle scallop of the  posterior leaflet of the mitral valve.   6. Tricuspid valve regurgitation is moderate.   7. The aortic valve is tricuspid. Aortic valve sclerosis is present, with  no evidence of aortic valve stenosis.   8. There is normal pulmonary artery systolic pressure. The estimated  right ventricular systolic pressure is 73.7 mmHg.   9. The inferior vena cava is dilated in size with >50% respiratory  variability, suggesting right atrial pressure of 8 mmHg.   EKG:  EKG is not ordered today.  Recent Labs: 01/29/2022: Magnesium 1.9; TSH 0.980 04/17/2022: Hemoglobin 12.6; Platelets 205.0; Pro B Natriuretic peptide (BNP) 870.0 05/14/2022: ALT 21; BNP 515.3; BUN 17; Creatinine, Ser 1.01; Potassium 4.6; Sodium 133  Recent Lipid Panel    Component Value Date/Time   CHOL 154 12/03/2016 0845   TRIG 81 12/03/2016 0845   HDL 67 12/03/2016 0845   CHOLHDL 2.3 12/03/2016 0845   VLDL 16 12/03/2016 0845   LDLCALC 71 12/03/2016 0845     Risk Assessment/Calculations:    CHA2DS2-VASc Score = 6   This indicates a 9.7% annual risk of stroke. The patient's score is based upon: CHF History: 1 HTN History: 1 Diabetes History: 0 Stroke History: 0 Vascular Disease History: 1 (PE) Age Score: 2 Gender Score: 1        Physical Exam:    VS:  BP 120/62   Pulse 96   Wt 119 lb (54 kg)   SpO2 97%   BMI 22.48 kg/m     Wt Readings from Last 3 Encounters:  06/05/22 119 lb (54 kg)  04/30/22 121 lb 9.6 oz (55.2 kg)  04/17/22 119 lb 6.4 oz (54.2 kg)     GEN:  Well nourished, well developed in no acute distress  HEENT: Normal NECK: No JVD; No carotid  bruits LYMPHATICS: No lymphadenopathy CARDIAC: RRR, no murmurs, rubs, gallops RESPIRATORY:  Clear to auscultation without rales, wheezing or rhonchi  ABDOMEN: Soft, non-tender, non-distended MUSCULOSKELETAL:right lower extremity swelling SKIN: Warm and dry NEUROLOGIC:  Alert and oriented x 3 PSYCHIATRIC:  Normal affect   ASSESSMENT:    1. Persistent atrial fibrillation ([place])   2. Frequent PVCs   3. SVT (supraventricular tachycardia) ([place])   4. Bilateral lower extremity edema   5. History of pulmonary embolism   6. Chronic anticoagulation   7. Essential hypertension   8. Pulmonary hypertension, unspecified ([place])   9. OSA (obstructive sleep apnea): Untreated    PLAN:    In order of problems listed above:  Persistent atrial fibrillation SVT, PACs, PVCs Rate controlled on metoprolol, Cardizem 120 mg, digoxin - she does not wish to do labs today, takes digoxin at night - have requested to get a digoxin level   History of PE, clear on recent CT chest Chronic anticoagulation No bleeding issues on Eliquis She is currently on 5 mg Eliquis twice daily to treat for PE, diagnosed Jan 2023. Dr. Chase Caller suggested reduce to 2.5 mg BID during the July visit, but this was not conveyed to her - the medication is very expensive - will reduce to 2.5 mg BID, gave some samples today   ?pulmonary hypertension Moderate MR BNP 515 Lasix as above - 40 mg daily for  now   Bilateral lower extremity edema Dr. [name] increased 20 mg lasix to 40 mg 04/30/22 every day   OSA not on CPAP Has mouth guard but does not use   Follow up with Dr. [name] in 2 months.   Digoxin level today BMP today Continue 40 mg lasix daily Reduce eliquis 2.5 mg BID    Medication Adjustments/Labs and Tests Ordered: Current medicines are reviewed at length with the patient today.  Concerns regarding medicines are outlined above.  Orders Placed This Encounter  Procedures   Basic Metabolic Panel (BMET)    Digoxin level   Meds ordered this encounter  Medications   metoprolol succinate (TOPROL-XL) 50 MG 24 hr tablet    Sig: TAKE 1 AND 1/2 TABLETS BY MOUTH TWO TIMES A DAY    Dispense:  270 tablet    Refill:  3   apixaban (ELIQUIS) 2.5 MG TABS tablet    Sig: Take 1 tablet (2.5 mg total) by mouth 2 (two) times daily.    Dispense:  180 tablet    Refill:  3    Patient Instructions  Medication Instructions:  Your physician has recommended you make the following change in your medication:  Please continue taking Lasix 71m daily DECREASE: Eliquis 2.528mtwice daily.  *If you need a refill on your cardiac medications before your next appointment, please call your pharmacy*   Lab Work: BMET and Digoxin levels If you have labs (blood work) drawn today and your tests are completely normal, you will receive your results only by: MyGrimesif you have MyChart) OR A paper copy in the mail If you have any lab test that is abnormal or we need to change your treatment, we will call you to review the results.   Testing/Procedures: NONE   Follow-Up: [facility]  As part of our continuing mission to provide you with exceptional heart care, we have created designated Provider Care Teams.  These Care Teams include your primary Cardiologist (physician)  and Advanced Practice Providers (APPs -  Physician Assistants and Nurse Practitioners) who all work together to provide you with the care you need, when you need it.  We recommend signing up for the patient portal called "MyChart".  Sign up information is provided on this After Visit Summary.  MyChart is used to connect with patients for Virtual Visits (Telemedicine).  Patients are able to view lab/test results, encounter notes, upcoming appointments, etc.  Non-urgent messages can be sent to your provider as well.   To learn more about what you can do with MyChart, go to  [email or web address].     Other Instructions Please put her on Dr Ermalinda Memos schedule in October.   Important Information About Sugar         Signed, Ledora Bottcher, [place]  06/05/2022 12:49 PM    [facility]

## 2022-06-04 ENCOUNTER — Ambulatory Visit: Payer: Medicare Other | Admitting: General Practice

## 2022-06-04 ENCOUNTER — Other Ambulatory Visit: Payer: Self-pay | Admitting: Cardiovascular Disease

## 2022-06-04 DIAGNOSIS — R609 Edema, unspecified: Secondary | ICD-10-CM | POA: Diagnosis not present

## 2022-06-04 DIAGNOSIS — Z8739 Personal history of other diseases of the musculoskeletal system and connective tissue: Secondary | ICD-10-CM | POA: Diagnosis not present

## 2022-06-04 DIAGNOSIS — M810 Age-related osteoporosis without current pathological fracture: Secondary | ICD-10-CM | POA: Diagnosis not present

## 2022-06-04 DIAGNOSIS — R21 Rash and other nonspecific skin eruption: Secondary | ICD-10-CM | POA: Diagnosis not present

## 2022-06-04 DIAGNOSIS — Z0000 Encounter for general adult medical examination without abnormal findings: Secondary | ICD-10-CM | POA: Diagnosis not present

## 2022-06-04 DIAGNOSIS — G479 Sleep disorder, unspecified: Secondary | ICD-10-CM | POA: Diagnosis not present

## 2022-06-04 DIAGNOSIS — F419 Anxiety disorder, unspecified: Secondary | ICD-10-CM | POA: Diagnosis not present

## 2022-06-04 DIAGNOSIS — F339 Major depressive disorder, recurrent, unspecified: Secondary | ICD-10-CM | POA: Diagnosis not present

## 2022-06-04 DIAGNOSIS — I10 Essential (primary) hypertension: Secondary | ICD-10-CM | POA: Diagnosis not present

## 2022-06-04 DIAGNOSIS — N390 Urinary tract infection, site not specified: Secondary | ICD-10-CM | POA: Diagnosis not present

## 2022-06-04 DIAGNOSIS — M549 Dorsalgia, unspecified: Secondary | ICD-10-CM | POA: Diagnosis not present

## 2022-06-05 ENCOUNTER — Encounter: Payer: Self-pay | Admitting: Physician Assistant

## 2022-06-05 ENCOUNTER — Ambulatory Visit: Payer: Medicare Other | Attending: General Practice | Admitting: Physician Assistant

## 2022-06-05 VITALS — BP 120/62 | HR 96 | Wt 119.0 lb

## 2022-06-05 DIAGNOSIS — I4819 Other persistent atrial fibrillation: Secondary | ICD-10-CM

## 2022-06-05 DIAGNOSIS — I471 Supraventricular tachycardia, unspecified: Secondary | ICD-10-CM

## 2022-06-05 DIAGNOSIS — R600 Localized edema: Secondary | ICD-10-CM | POA: Diagnosis not present

## 2022-06-05 DIAGNOSIS — Z7901 Long term (current) use of anticoagulants: Secondary | ICD-10-CM | POA: Diagnosis not present

## 2022-06-05 DIAGNOSIS — G4733 Obstructive sleep apnea (adult) (pediatric): Secondary | ICD-10-CM | POA: Diagnosis not present

## 2022-06-05 DIAGNOSIS — I493 Ventricular premature depolarization: Secondary | ICD-10-CM | POA: Diagnosis not present

## 2022-06-05 DIAGNOSIS — I272 Pulmonary hypertension, unspecified: Secondary | ICD-10-CM | POA: Diagnosis not present

## 2022-06-05 DIAGNOSIS — I10 Essential (primary) hypertension: Secondary | ICD-10-CM

## 2022-06-05 DIAGNOSIS — Z86711 Personal history of pulmonary embolism: Secondary | ICD-10-CM

## 2022-06-05 MED ORDER — METOPROLOL SUCCINATE ER 50 MG PO TB24: ORAL_TABLET | ORAL | 3 refills | Status: DC

## 2022-06-05 MED ORDER — APIXABAN 2.5 MG PO TABS: 2.5000 mg | ORAL_TABLET | Freq: Two times a day (BID) | ORAL | 3 refills | Status: DC

## 2022-06-05 NOTE — Patient Instructions (Signed)
Medication Instructions:  Your physician has recommended you make the following change in your medication:  Please continue taking Lasix 66m daily DECREASE: Eliquis 2.548mtwice daily.  *If you need a refill on your cardiac medications before your next appointment, please call your pharmacy*   Lab Work: BMET and Digoxin levels If you have labs (blood work) drawn today and your tests are completely normal, you will receive your results only by: MyLa [place]if you have MyChart) OR A paper copy in the mail If you have any lab test that is abnormal or we need to change your treatment, we will call you to review the results.   Testing/Procedures: NONE   Follow-Up: [facility] and your health needs are our priority.  As part of our continuing mission to provide you with exceptional heart care, we have created designated Provider Care Teams.  These Care Teams include your primary Cardiologist (physician) and Advanced Practice Providers (APPs -  Physician Assistants and Nurse Practitioners) who all work together to provide you with the care you need, when you need it.  We recommend signing up for the patient portal called "MyChart".  Sign up information is provided on this After Visit Summary.  MyChart is used to connect with patients for Virtual Visits (Telemedicine).  Patients are able to view lab/test results, encounter notes, upcoming appointments, etc.  Non-urgent messages can be sent to your provider as well.   To learn more about what you can do with MyChart, go to [email or web address]    Other Instructions Please put her on Dr KeErmalinda Memoschedule in October.   Important Information About Sugar

## 2022-06-06 DIAGNOSIS — I4819 Other persistent atrial fibrillation: Secondary | ICD-10-CM | POA: Diagnosis not present

## 2022-06-07 LAB — BASIC METABOLIC PANEL
BUN/Creatinine Ratio: 19 (ref 12–28)
BUN: 20 mg/dL (ref 8–27)
CO2: 22 mmol/L (ref 20–29)
Calcium: 9.7 mg/dL (ref 8.7–10.3)
Chloride: 96 mmol/L (ref 96–106)
Creatinine, Ser: 1.07 mg/dL — ABNORMAL HIGH (ref 0.57–1.00)
Glucose: 102 mg/dL — ABNORMAL HIGH (ref 70–99)
Potassium: 4.7 mmol/L (ref 3.5–5.2)
Sodium: 137 mmol/L (ref 134–144)
eGFR: 50 mL/min/{1.73_m2} — ABNORMAL LOW (ref >59)

## 2022-06-07 LAB — DIGOXIN LEVEL: Digoxin, Serum: <0.4 ng/mL — ABNORMAL LOW (ref 0.5–0.9)

## 2022-06-09 ENCOUNTER — Telehealth: Payer: Self-pay | Admitting: Internal Medicine

## 2022-06-09 NOTE — Telephone Encounter (Signed)
Called pt back to let her know that MR was not in the office today 9/11 but would be back tomorrow 9/12. Stated to her that once he responded to the message that we would call her to let her know what he said and she verbalized understanding.

## 2022-06-09 NOTE — Telephone Encounter (Signed)
Called patient and she is wanting to let Dr Chase Caller that her cardiologist changing the dose of her eliquis. She is wanting to know if this is ok with Dr Chase Caller first.   Please advise sir.

## 2022-06-10 NOTE — Telephone Encounter (Signed)
Yes it is okay to take Eliquis at the lower dose of 2.5 mg twice daily.  This is the best balance of benefit versus risk

## 2022-06-11 NOTE — Telephone Encounter (Signed)
Attempted to call pt but unable to reach. Left message for her to return call. 

## 2022-06-11 NOTE — Telephone Encounter (Signed)
Patient called back and was informed of MR's message- "okay to take Eliquis at the lower dose of 2.5 mg twice daily.  This is the best balance of benefit versus risk" patient verbalized understanding.

## 2022-06-27 ENCOUNTER — Other Ambulatory Visit: Payer: Self-pay | Admitting: Cardiovascular Disease

## 2022-07-03 ENCOUNTER — Ambulatory Visit: Payer: Medicare Other | Attending: Family Medicine | Admitting: Physical Therapy

## 2022-07-03 ENCOUNTER — Encounter: Payer: Self-pay | Admitting: Physical Therapy

## 2022-07-03 ENCOUNTER — Other Ambulatory Visit: Payer: Self-pay

## 2022-07-03 DIAGNOSIS — R293 Abnormal posture: Secondary | ICD-10-CM | POA: Diagnosis not present

## 2022-07-03 DIAGNOSIS — M4125 Other idiopathic scoliosis, thoracolumbar region: Secondary | ICD-10-CM | POA: Diagnosis not present

## 2022-07-03 DIAGNOSIS — M546 Pain in thoracic spine: Secondary | ICD-10-CM | POA: Insufficient documentation

## 2022-07-03 DIAGNOSIS — M542 Cervicalgia: Secondary | ICD-10-CM | POA: Insufficient documentation

## 2022-07-03 DIAGNOSIS — M6281 Muscle weakness (generalized): Secondary | ICD-10-CM | POA: Insufficient documentation

## 2022-07-03 NOTE — Therapy (Signed)
OUTPATIENT PHYSICAL THERAPY THORACOLUMBAR EVALUATION   Patient Name: Janet Nguyen MRN: [id number] DOB:02-Dec-1931, 88 y.o., female Today's Date: 07/03/2022   PT End of Session - 07/03/22 1606     Visit Number 1    Date for PT Re-Evaluation 08/28/22    Authorization Type Medicare Part A/B, KX at visit 15    Progress Note Due on Visit 10    PT Start Time 1400    PT Stop Time 1442    PT Time Calculation (min) 42 min    Activity Tolerance Patient tolerated treatment well    Behavior During Therapy [facility] for tasks assessed/performed             Past Medical History:  Diagnosis Date   Arrhythmia    History of SVT with documented PVC'S and  PAC'S  12/08/12 Nuc stress test normal LV EF 74%  Event Monitor  12/01/12-01/03/13   Atrial flutter ([place])    Celiac disease    treated by Dr. 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 Marijean Bravo; pt states she was diagnosed 10-15 years ago, not treated at this time or any issues that she is aware of.   Scoliosis    Scoliosis    Sleep apnea 10/02/11 [facility]   Sleep study AHI -total sleep 10.3/hr  64.0/ hr during REM sleep.RDI 22.8/hr during total sleep 64.0/hr during REM sleep The lowest O2 sat during Non-REM and REM sleep was 86% and 88% respectively. 04/08/12 CPAP/BIPAP titration study [facility]   Past Surgical History:  Procedure Laterality Date   APPENDECTOMY     ruptured at age 17 and had surgery   CARDIAC CATHETERIZATION  01/27/06   CARDIOVERSION N/A 01/08/2022   Procedure: CARDIOVERSION;  Surgeon: Freada Bergeron, MD;  Location[facility] END[facility]COPY;  Service: Cardiovascular;  Laterality: N/A;   cataract surgery  2015   Dr. Herbert Deaner; March & April 2015   Patient Active Problem List   Diagnosis Date  Noted   Secondary hypercoagulable state ([place]) 12/12/2021   Hyponatremia 12/04/2021   Acute CHF (congestive heart failure) ([place]) 12/02/2021   Acute on chronic diastolic CHF (congestive heart failure) ([place]) 12/02/2021   Atrial fibrillation with rapid ventricular response ([place]) 12/02/2021   Essential hypertension 12/02/2021   GERD without esophagitis 12/02/2021   Obstructive sleep apnea 12/02/2021   Interstitial lung disease ([place]) 12/02/2021   History of pulmonary embolism 12/02/2021   Pulmonary embolism ([place]) 11/06/2021   Bronchiectasis ([place]) 11/06/2021   Deviated septum 05/08/2020   Mass of subcutaneous tissue 05/02/2020   Vertigo 06/16/2019   Pulmonary hypertension, unspecified ([place]) 04/14/2019   Ischemic colitis ([place]) 04/14/2019   Migraine with aura and without status migrainosus, not intractable 11/08/2018   Degeneration of lumbar intervertebral disc 10/14/2018   Hoarseness of voice 03/04/2018   Abdominal pain 07/01/2017   Diverticulitis, colon    Metabolic acidosis, increased anion gap    Constipation 04/14/2017   Lumbar hernia 04/14/2017   Presbycusis of both ears 01/10/2017   Tinnitus aurium, bilateral 01/10/2017   Gastroesophageal reflux disease 08/20/2016   Hemoptysis 08/20/2016   Obstructive sleep apnea of adult 08/20/2016   Rhinitis, chronic 08/20/2016   Throat pain in adult 08/20/2016   Fatigue 12/23/2015   Sciatica of  right side 10/06/2015   History of migraine headaches 10/06/2015   Frequent PVCs 12/28/2013   Premature atrial contractions 12/28/2013   PSVT (paroxysmal supraventricular tachycardia) ([place]) 12/28/2013   Heart palpitations 07/13/2013   Sleep apnea 04/11/2013   Scoliosis 04/11/2013   Atypical atrial flutter ([place]) 11/29/2012   Chest pain, atypical 11/29/2012   Fibromyalgia syndrome 11/29/2012   Chronic steroid use 11/29/2012      REFERRING PROVIDER: Lawerance Cruel, MD   REFERRING DIAG: M54.9 (ICD-10-CM) - Back pain   Rationale for  Evaluation and Treatment Rehabilitation  THERAPY DIAG:  Other idiopathic scoliosis, thoracolumbar region  Abnormal posture  Muscle weakness (generalized)  Pain in thoracic spine  Cervicalgia  ONSET DATE: chronic LBP  SUBJECTIVE:                                                                                                                                                                                           SUBJECTIVE STATEMENT: I have had a bad year.  I had PE, was hospitalized, have a swollen Rt leg, am now on Lasix, I'm in afib (cardioversion didn't stick).  Blood clot in leg was ruled out.  My leg swelling is much better than it was but still very swollen.  I am easily winded even when I'm talking.   I have lost 5 inches with my scoliosis.  I am so compressed and painful on my Lt side I have no room for my stomach so eating is very challenging.  I struggle to move my bowels.   I can only lay on my Rt side or my back.  Laying on my Rt side so much can start hurting my hip.   My neck and shoulders always hurt.   My activity level has diminished signif due to getting so easily winded.  I won't be able to do the NuStep given how winded I get.  I am trying to consistently get back into my stretches from last PT sessions.   PERTINENT HISTORY:  Pt has cardiac doctor, rheumatologist Complex medical history: of note October 25, 2021 was positive for PE, hospitalized on March 6 through December 04, 2021 with increased palpitations and was found to be in atrial fibrillation, cardioversion April 2023 was initially successful but not long term  Long history of pain from signif scoliosis, has been in/out of PT for this History of migraine headaches Sleep apnea without tolerance of CPAP, uses mouthguard  PAIN:  PAIN:  Are you having pain? Yes NPRS scale: 10/10 Pain location: neck, back, shoulders, Rt hip Pain orientation: Right and Bilateral  PAIN TYPE: aching, throbbing, and tight Pain  description: constant  Aggravating factors: activity, laying on Rt side Relieving factors: rest    PRECAUTIONS: None  WEIGHT BEARING RESTRICTIONS No  FALLS:  Has patient fallen in last 6 months? No  LIVING ENVIRONMENT: Lives with: lives alone Lives in: Other single level condo Stairs: No Has following equipment at home: None  OCCUPATION: retired  PLOF: Independent with household mobility without device and Independent with community mobility without device  PATIENT GOALS open up Lt side of trunk, work back into my stretches, less pain   OBJECTIVE:   DIAGNOSTIC FINDINGS:  No new imaging    SCREENING FOR RED FLAGS: Bowel or bladder incontinence: No Spinal tumors: No Cauda equina syndrome: No Compression fracture: No Abdominal aneurysm: No  COGNITION:  Overall cognitive status: Within functional limits for tasks assessed     SENSATION: WFL  MUSCLE LENGTH: End range bil hamstring limited Lt lat limits full Lt shoulder flexion  POSTURE:  severe scoliosis, concave on Lt with lower ribcage and pelvis approximation  PALPATION: Diffuse tenderness cervical, thoracic soft tissues, very tight Lt latissimus dorsi, bil pectorals, Lt obliques and QL, Rt hip Non-pitting edema in Rt leg and foot present - under care of MD and DVT ruled out per chart review, on Lasix  CERVICAL ROM:   Active  A/PROM  eval  Flexion 57  Extension 35  Right lateral flexion 12  Left lateral flexion 15  Right rotation 40  Left rotation 40   (Blank rows = not tested)  LUMBAR ROM:   Active  A/PROM  eval  Flexion Fingers to mid shin  Extension Not tested  Right lateral flexion 5  Left lateral flexion 3  Right rotation 20%  Left rotation 20%   (Blank rows = not tested)  LOWER EXTREMITY ROM:     Passive  Right eval Left eval  Hip flexion    Hip extension    Hip abduction    Hip adduction    Hip internal rotation 35 40  Hip external rotation 50 60  Knee flexion    Knee  extension    Ankle dorsiflexion    Ankle plantarflexion    Ankle inversion    Ankle eversion     (Blank rows = not tested)  LOWER EXTREMITY MMT:    4/5 bil LE   GAIT: Distance walked: within clinic Assistive device utilized: None Level of assistance: Complete Independence Comments: gets very winded    TODAY'S TREATMENT  Eval 07/03/22: Seated STM bil upper traps, Lt lat, Lt obliques, bil pectoral manual stretching   PATIENT EDUCATION:  Education details: discussed plan of care and goals Person educated: Patient Education method: Explanation Education comprehension: verbalized understanding   HOME EXERCISE PROGRAM: Start next time to get back into trunk and LE stretches  ASSESSMENT:  CLINICAL IMPRESSION: Patient is a 88 y.o. female with very complex medical history who was seen today for physical therapy evaluation and treatment for back pain related to severe scoliosis.    OBJECTIVE IMPAIRMENTS decreased activity tolerance, decreased endurance, decreased mobility, decreased ROM, decreased strength, hypomobility, increased fascial restrictions, increased muscle spasms, impaired flexibility, impaired UE functional use, improper body mechanics, postural dysfunction, and pain.   ACTIVITY LIMITATIONS carrying, lifting, bending, standing, sleeping, transfers, and reach over head  PARTICIPATION LIMITATIONS: cleaning, laundry, driving, shopping, community activity, and yard work  PERSONAL FACTORS Age and Time since onset of injury/illness/exacerbation are also affecting patient's functional outcome.   REHAB POTENTIAL: Good  CLINICAL DECISION MAKING: Stable/uncomplicated  EVALUATION COMPLEXITY: Low  GOALS: Goals reviewed with patient? Yes  SHORT TERM GOALS: Target date: 07/31/2022  Pt will be ind with initial HEP for trunk and LE mobility and stretching Baseline: Goal status: INITIAL    LONG TERM GOALS: Target date: 08/28/2022  Pt will be ind with strategies  for symptom management including pacing, spinal decompression, positioning, and HEP. Baseline:  Goal status: INITIAL  2.  Pt will improve bil hamstring length to WNL to reduce back pain Baseline:  Goal status: INITIAL  3.  Pt will be able to perform light daily tasks within home for up to 30 min before needing a positional rest break. Baseline:  Goal status: INITIAL  4.  Pt will achieve improved soft tissue mobility of Lt side of trunk and bil cervical region to reduce pain and decompress abdominal organs Baseline:  Goal status: INITIAL  5.  Improve neck rotation to at least 50 deg for improved visibility with driving. Baseline: 40 deg bil Goal status: INITIAL     PLAN: PT FREQUENCY: 1-2x/week  PT DURATION: 8 weeks  PLANNED INTERVENTIONS: Therapeutic exercises, Therapeutic activity, Neuromuscular re-education, Balance training, Patient/Family education, Self Care, Joint mobilization, Aquatic Therapy, Dry Needling, Electrical stimulation, Spinal mobilization, Cryotherapy, Moist heat, and Manual therapy.  PLAN FOR NEXT SESSION: manual techniques to open up Lt side of trunk, begin light stretching as tol for hamstrings, QL, pectorals, Lt lat (wall slide in doorway), Pt with very limited cardiovascular endurance secondary to complex medical history so will likely not tol NuStep initially   [employer], PT 07/03/22 4:07 PM

## 2022-07-04 DIAGNOSIS — Z6823 Body mass index (BMI) 23.0-23.9, adult: Secondary | ICD-10-CM | POA: Diagnosis not present

## 2022-07-04 DIAGNOSIS — R609 Edema, unspecified: Secondary | ICD-10-CM | POA: Diagnosis not present

## 2022-07-04 DIAGNOSIS — F419 Anxiety disorder, unspecified: Secondary | ICD-10-CM | POA: Diagnosis not present

## 2022-07-04 DIAGNOSIS — R0602 Shortness of breath: Secondary | ICD-10-CM | POA: Diagnosis not present

## 2022-07-04 DIAGNOSIS — J392 Other diseases of pharynx: Secondary | ICD-10-CM | POA: Diagnosis not present

## 2022-07-04 DIAGNOSIS — R413 Other amnesia: Secondary | ICD-10-CM | POA: Diagnosis not present

## 2022-07-04 DIAGNOSIS — Z23 Encounter for immunization: Secondary | ICD-10-CM | POA: Diagnosis not present

## 2022-07-10 DIAGNOSIS — H538 Other visual disturbances: Secondary | ICD-10-CM | POA: Diagnosis not present

## 2022-07-10 DIAGNOSIS — H353132 Nonexudative age-related macular degeneration, bilateral, intermediate dry stage: Secondary | ICD-10-CM | POA: Diagnosis not present

## 2022-07-10 DIAGNOSIS — H04123 Dry eye syndrome of bilateral lacrimal glands: Secondary | ICD-10-CM | POA: Diagnosis not present

## 2022-07-10 DIAGNOSIS — H40013 Open angle with borderline findings, low risk, bilateral: Secondary | ICD-10-CM | POA: Diagnosis not present

## 2022-07-16 ENCOUNTER — Ambulatory Visit: Payer: Medicare Other

## 2022-07-16 DIAGNOSIS — M4125 Other idiopathic scoliosis, thoracolumbar region: Secondary | ICD-10-CM | POA: Diagnosis not present

## 2022-07-16 DIAGNOSIS — M546 Pain in thoracic spine: Secondary | ICD-10-CM | POA: Diagnosis not present

## 2022-07-16 DIAGNOSIS — M542 Cervicalgia: Secondary | ICD-10-CM

## 2022-07-16 DIAGNOSIS — M6281 Muscle weakness (generalized): Secondary | ICD-10-CM | POA: Diagnosis not present

## 2022-07-16 DIAGNOSIS — R293 Abnormal posture: Secondary | ICD-10-CM

## 2022-07-16 NOTE — Therapy (Signed)
OUTPATIENT PHYSICAL THERAPY TREATMENT   Patient Name: Janet Nguyen MRN: [id number] DOB:1932/05/02, 88 y.o., female Today's Date: 07/16/2022   PT End of Session - 07/16/22 1139     Visit Number 2    Date for PT Re-Evaluation 08/28/22    Authorization Type Medicare Part A/B, KX at visit 15    Progress Note Due on Visit 10    PT Start Time 1104    PT Stop Time 1139    PT Time Calculation (min) 35 min    Activity Tolerance Patient tolerated treatment well    Behavior During Therapy [facility] for tasks assessed/performed              Past Medical History:  Diagnosis Date   Arrhythmia    History of SVT with documented PVC'S and  PAC'S  12/08/12 Nuc stress test normal LV EF 74%  Event Monitor  12/01/12-01/03/13   Atrial flutter ([place])    Celiac disease    treated by Dr. 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 Marijean Bravo; pt states she was diagnosed 10-15 years ago, not treated at this time or any issues that she is aware of.   Scoliosis    Scoliosis    Sleep apnea 10/02/11 [facility]   Sleep study AHI -total sleep 10.3/hr  64.0/ hr during REM sleep.RDI 22.8/hr during total sleep 64.0/hr during REM sleep The lowest O2 sat during Non-REM and REM sleep was 86% and 88% respectively. 04/08/12 CPAP/BIPAP titration study [facility]   Past Surgical History:  Procedure Laterality Date   APPENDECTOMY     ruptured at age 48 and had surgery   CARDIAC CATHETERIZATION  01/27/06   CARDIOVERSION N/A 01/08/2022   Procedure: CARDIOVERSION;  Surgeon: Freada Bergeron, MD;  Location: [facility] ENDOSCOPY;  Service: Cardiovascular;  Laterality: N/A;   cataract surgery  2015   Dr. Herbert Deaner; March & April 2015   Patient Active Problem List   Diagnosis Date Noted    Secondary hypercoagulable state ([place]) 12/12/2021   Hyponatremia 12/04/2021   Acute CHF (congestive heart failure) ([place]) 12/02/2021   Acute on chronic diastolic CHF (congestive heart failure) ([place]) 12/02/2021   Atrial fibrillation with rapid ventricular response ([place]) 12/02/2021   Essential hypertension 12/02/2021   GERD without esophagitis 12/02/2021   Obstructive sleep apnea 12/02/2021   Interstitial lung disease ([place]) 12/02/2021   History of pulmonary embolism 12/02/2021   Pulmonary embolism ([place]) 11/06/2021   Bronchiectasis ([place]) 11/06/2021   Deviated septum 05/08/2020   Mass of subcutaneous tissue 05/02/2020   Vertigo 06/16/2019   Pulmonary hypertension, unspecified ([place]) 04/14/2019   Ischemic colitis ([place]) 04/14/2019   Migraine with aura and without status migrainosus, not intractable 11/08/2018   Degeneration of lumbar intervertebral disc 10/14/2018   Hoarseness of voice 03/04/2018   Abdominal pain 07/01/2017   Diverticulitis, colon    Metabolic acidosis, increased anion gap    Constipation 04/14/2017   Lumbar hernia 04/14/2017   Presbycusis of both ears 01/10/2017   Tinnitus aurium, bilateral 01/10/2017   Gastroesophageal reflux disease 08/20/2016   Hemoptysis 08/20/2016   Obstructive sleep apnea of adult 08/20/2016   Rhinitis, chronic 08/20/2016   Throat pain in adult 08/20/2016   Fatigue 12/23/2015   Sciatica of  right side 10/06/2015   History of migraine headaches 10/06/2015   Frequent PVCs 12/28/2013   Premature atrial contractions 12/28/2013   PSVT (paroxysmal supraventricular tachycardia) ([place]) 12/28/2013   Heart palpitations 07/13/2013   Sleep apnea 04/11/2013   Scoliosis 04/11/2013   Atypical atrial flutter ([place]) 11/29/2012   Chest pain, atypical 11/29/2012   Fibromyalgia syndrome 11/29/2012   Chronic steroid use 11/29/2012      REFERRING PROVIDER: Lawerance Cruel, MD   REFERRING DIAG: M54.9 (ICD-10-CM) - Back pain   Rationale for Evaluation and  Treatment Rehabilitation  THERAPY DIAG:  Other idiopathic scoliosis, thoracolumbar region  Abnormal posture  Muscle weakness (generalized)  Pain in thoracic spine  Cervicalgia  ONSET DATE: chronic LBP  SUBJECTIVE:                                                                                                                                                                                           SUBJECTIVE STATEMENT: I am not doing great.  My left side is tight.  From evaluation: I have had a bad year.  I had PE, was hospitalized, have a swollen Rt leg, am now on Lasix, I'm in afib (cardioversion didn't stick).  Blood clot in leg was ruled out.  My leg swelling is much better than it was but still very swollen.  I am easily winded even when I'm talking.   I have lost 5 inches with my scoliosis.  I am so compressed and painful on my Lt side I have no room for my stomach so eating is very challenging.  I struggle to move my bowels.   I can only lay on my Rt side or my back.  Laying on my Rt side so much can start hurting my hip.   My neck and shoulders always hurt.   My activity level has diminished signif due to getting so easily winded.  I won't be able to do the NuStep given how winded I get.  I am trying to consistently get back into my stretches from last PT sessions.   PERTINENT HISTORY:  Pt has cardiac doctor, rheumatologist Complex medical history: of note October 25, 2021 was positive for PE, hospitalized on March 6 through December 04, 2021 with increased palpitations and was found to be in atrial fibrillation, cardioversion April 2023 was initially successful but not long term  Long history of pain from signif scoliosis, has been in/out of PT for this History of migraine headaches Sleep apnea without tolerance of CPAP, uses mouthguard  PAIN:  PAIN:  Are you having pain? Yes NPRS scale: 8/10 Pain location: neck, back, shoulders, Rt  hip Pain orientation: Right and Bilateral   PAIN TYPE: aching, throbbing, and tight Pain description: constant  Aggravating factors: activity, laying on Rt side Relieving factors: rest    PRECAUTIONS: None  WEIGHT BEARING RESTRICTIONS No  FALLS:  Has patient fallen in last 6 months? No  LIVING ENVIRONMENT: Lives with: lives alone Lives in: Other single level condo Stairs: No Has following equipment at home: None  OCCUPATION: retired  PLOF: Independent with household mobility without device and Independent with community mobility without device  PATIENT GOALS open up Lt side of trunk, work back into my stretches, less pain   OBJECTIVE:   DIAGNOSTIC FINDINGS:  No new imaging    SCREENING FOR RED FLAGS: Bowel or bladder incontinence: No Spinal tumors: No Cauda equina syndrome: No Compression fracture: No Abdominal aneurysm: No  COGNITION:  Overall cognitive status: Within functional limits for tasks assessed     SENSATION: WFL  MUSCLE LENGTH: End range bil hamstring limited Lt lat limits full Lt shoulder flexion  POSTURE:  severe scoliosis, concave on Lt with lower ribcage and pelvis approximation  PALPATION: Diffuse tenderness cervical, thoracic soft tissues, very tight Lt latissimus dorsi, bil pectorals, Lt obliques and QL, Rt hip Non-pitting edema in Rt leg and foot present - under care of MD and DVT ruled out per chart review, on Lasix  CERVICAL ROM:   Active  A/PROM  eval  Flexion 57  Extension 35  Right lateral flexion 12  Left lateral flexion 15  Right rotation 40  Left rotation 40   (Blank rows = not tested)  LUMBAR ROM:   Active  A/PROM  eval  Flexion Fingers to mid shin  Extension Not tested  Right lateral flexion 5  Left lateral flexion 3  Right rotation 20%  Left rotation 20%   (Blank rows = not tested)  LOWER EXTREMITY ROM:     Passive  Right eval Left eval  Hip flexion    Hip extension    Hip abduction    Hip adduction    Hip internal rotation 35 40  Hip  external rotation 50 60  Knee flexion    Knee extension    Ankle dorsiflexion    Ankle plantarflexion    Ankle inversion    Ankle eversion     (Blank rows = not tested)  LOWER EXTREMITY MMT:    4/5 bil LE   GAIT: Distance walked: within clinic Assistive device utilized: None Level of assistance: Complete Independence Comments: gets very winded    TODAY'S TREATMENT  Date: 07/16/22 Manual:  elongation to Lt side, thoracic and pelvic and associated soft tissue.  Seated STM bil upper traps, Lt lat, Lt obliques, bil pectoral manual stretching   Eval 07/03/22: Seated STM bil upper traps, Lt lat, Lt obliques, bil pectoral manual stretching   PATIENT EDUCATION:  Education details: discussed plan of care and goals Person educated: Patient Education method: Explanation Education comprehension: verbalized understanding   HOME EXERCISE PROGRAM: Start next time to get back into trunk and LE stretches  ASSESSMENT:  CLINICAL IMPRESSION: First time follow-up after evaluation.  Pt has been stretching in supine and standing.  Pt with tension in Lt lats, upper traps and along rib cage that approximates to her Lt pelvis due to scoliosis. Pt had good response to manual therapy today.  PT encouraged pt to do short bouts of walking as able to improve her endurance and do diaphragmatic breathing to improve capacity.  Patient will benefit from skilled PT  to address the below impairments and improve overall function.    OBJECTIVE IMPAIRMENTS decreased activity tolerance, decreased endurance, decreased mobility, decreased ROM, decreased strength, hypomobility, increased fascial restrictions, increased muscle spasms, impaired flexibility, impaired UE functional use, improper body mechanics, postural dysfunction, and pain.   ACTIVITY LIMITATIONS carrying, lifting, bending, standing, sleeping, transfers, and reach over head  PARTICIPATION LIMITATIONS: cleaning, laundry, driving, shopping,  community activity, and yard work  PERSONAL FACTORS Age and Time since onset of injury/illness/exacerbation are also affecting patient's functional outcome.   REHAB POTENTIAL: Good  CLINICAL DECISION MAKING: Stable/uncomplicated  EVALUATION COMPLEXITY: Low   GOALS: Goals reviewed with patient? Yes  SHORT TERM GOALS: Target date: 07/31/22  Pt will be ind with initial HEP for trunk and LE mobility and stretching Baseline: Goal status: INITIAL    LONG TERM GOALS: Target date: 08/28/22  Pt will be ind with strategies for symptom management including pacing, spinal decompression, positioning, and HEP. Baseline:  Goal status: INITIAL  2.  Pt will improve bil hamstring length to WNL to reduce back pain Baseline:  Goal status: INITIAL  3.  Pt will be able to perform light daily tasks within home for up to 30 min before needing a positional rest break. Baseline:  Goal status: INITIAL  4.  Pt will achieve improved soft tissue mobility of Lt side of trunk and bil cervical region to reduce pain and decompress abdominal organs Baseline:  Goal status: INITIAL  5.  Improve neck rotation to at least 50 deg for improved visibility with driving. Baseline: 40 deg bil Goal status: INITIAL     PLAN: PT FREQUENCY: 1-2x/week  PT DURATION: 8 weeks  PLANNED INTERVENTIONS: Therapeutic exercises, Therapeutic activity, Neuromuscular re-education, Balance training, Patient/Family education, Self Care, Joint mobilization, Aquatic Therapy, Dry Needling, Electrical stimulation, Spinal mobilization, Cryotherapy, Moist heat, and Manual therapy.  PLAN FOR NEXT SESSION: manual techniques to open up Lt side of trunk, begin light stretching as tol for hamstrings, QL, pectorals, Lt lat (wall slide in doorway), Pt with very limited cardiovascular endurance secondary to complex medical history so will likely not tol NuStep initially   [employer], PT 07/16/22 11:46 AM

## 2022-07-21 ENCOUNTER — Ambulatory Visit (INDEPENDENT_AMBULATORY_CARE_PROVIDER_SITE_OTHER): Payer: Medicare Other | Admitting: Internal Medicine

## 2022-07-21 ENCOUNTER — Encounter: Payer: Self-pay | Admitting: Internal Medicine

## 2022-07-21 VITALS — BP 114/56 | HR 78 | Temp 97.7°F | Ht 61.0 in | Wt 116.8 lb

## 2022-07-21 DIAGNOSIS — I2699 Other pulmonary embolism without acute cor pulmonale: Secondary | ICD-10-CM

## 2022-07-21 DIAGNOSIS — J479 Bronchiectasis, uncomplicated: Secondary | ICD-10-CM

## 2022-07-21 DIAGNOSIS — R0609 Other forms of dyspnea: Secondary | ICD-10-CM

## 2022-07-21 NOTE — Patient Instructions (Addendum)
DOE (dyspnea on exertion)   - scoliosis, bronchiectasis, mitral valve leak and and grade 2 heart muscle stiffness, atrial flutter contributing to respiratory symptoms - stable   Plan -Supportive care - Address individual components below  #Pulmonary embolism diagnosed January 2023  - no recurrence since oroginal onset. Understand eliquis is expensive. Have completed 6 months as of July 2023. In sept 2023 dropped to low dose eliquis  Plan  -Cotninue eliquis 2.32m twice daily  - From PE perspective you can probably stop it In Jan 2024 but you probably need it for your A Fib - talk to Dr KClaiborne Billingsabout need to continue eliquis v stop eliquis  #possible epistaxis recurrence at night  Plan - keep any eye on bleeding atg night or any bleeding - any issues call Dr KClaiborne Billingsor our office    #Bronchiectasis  -Spiriva sample months ago did not help and was too expensive  Plan  - Flutter valve 5 times daily - No need for spiriva  Right lower lobe lung nodule 7.555mon CT 10/09/21 - new. Resolved nodule July 2023 Radon Exposure x 34 years - newly discovered   Plan  - CT if clinical need   Mitral valve insufficiency, unspecified etiology Grade II diastolic dysfunction ATrial flutter   - concerned your might have decompensation and congestion of your heart muscle function  Plan' -  repeat echo timing by cardiology  #history of sleep apnea with prior intolerance to cPAP in 2015-2017  Plan  = expectant followup  #Pedeal Edema R > Left  since summer 2023  - due to varicose veins and heart and lung issues  Plan  - management by PCP RoLawerance CruelMD and cardiology   Positive rheumatoid factor January 2023  - shared decisio  in July 2023 making we wil follow expectantly since joints are okay   Plan  - At some point we can discuss visiting rheumatologist but hold off for the moment  Overall  - problems cannot be fixed. I think you are at baseline. Extra stress level  due to  living alone [place] ESAS score and track it once a month - accept the baseline  -monitor for variation - keep family in loop  Followup - return in 3 months with Dr RaChase Caller 30 min visit

## 2022-07-21 NOTE — Progress Notes (Signed)
OV 10/22/2021  Subjective:  Patient ID: Janet Nguyen, female , DOB: October 16, 1931 , age 88 y.o. , MRN: [id number] , ADDRESS: [place] [id number] PCP Janet Cruel, MD Patient Care Team: Janet Cruel, MD as PCP - General (Family Medicine) Janet Sine, MD as PCP - Cardiology (Cardiology)  This Provider for this visit: Treatment Team:  Attending Provider: Brand Males, MD    10/22/2021 -   Chief Complaint  Patient presents with   Consult    Pt had a recent CT performed which is the reason for today's visit.     HPI Janet Nguyen 88 y.o. -referred for shortness of breath and cough.  Has known severe scoliosis.  Found to have bronchiectasis on the CT chest.  History is provided by the patient and review of the medical records.  She is a very good historian.  She is divorced.  She has grown children who all live all over the country.  She has several grandchildren.  She says she is known to have deviated nasal septum, celiac disease for which she is on a restrictive diet, sleep apnea but she cannot tolerate CPAP.  She is most importantly known to have severe scoliosis that was first diagnosed in her younger years.  She says after age 41 is around when they made the diagnosis although she suspect she has had a longer period she is just on observation treatment.  She did yoga and severe exercise to keep herself physically fit.  Over the years scoliosis is gotten worse that she is lost 6 inches in total height.  This is producing physical pressure effects.  She thinks with the onset of the pandemic and particularly with worsening of the scoliosis and particularly for the last few months or so she said dyspnea on exertion [echo did show grade 2 diastolic dysfunction], decreased socializing, decreased sleep, feeling depressed.  Also she feels her migraines are worse.  She when she wakes up in the morning she feels an elephant is on the chest.   She also has a cough.  The respiratory symptoms are mild overall.  But she does definitely have it and is definitely new.  Sometime back a year ago or 2 years ago she is to be able to walk at least a mile now she gets dyspneic walking a block.  Of note she reports a few episodes of severe vomiting acute episodes 2 times in 2022.  Idiopathic.  There is associated heartburn  She has had the COVID-vaccine but does not have the COVID itself  Review of system positive for fatigue for the last several months arthralgia for the last several years.  Denies any family history of lung disease  Denies any substance abuse  Lives in a 86 year old home.  Detailed review shows no organic dust antigen exposure.  She does have a bird feeder.  Occupational history: Detail organic and inorganic antigen exposure history is negative    CT Chest data 10/09/21 -    IMPRESSION: 1. 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 Chronic aspiration could have a similar appearance. 2. Part solid nodule of the right lower lobe measuring 7.5 mm in mean diameter with 4 mm solid component. Follow-up non-contrast CT recommended at 3-6 months to confirm persistence. If  unchanged, and solid component remains <6 mm, annual CT is recommended until 5 years of stability has been established. If persistent these nodules should be considered highly suspicious if the solid component of the nodule is 6 mm or greater in size and enlarging. This recommendation follows the consensus statement: Guidelines for Management of Incidental Pulmonary Nodules Detected on CT Images: From the Fleischner Society 2017; Radiology 2017; 284:228-243. 3.  Aortic Atherosclerosis (ICD10-I70.0).     Electronically Signed   By: Janet Nguyen  M.D.   On: 10/09/2021 15:34  No results found.   11/06/2021 Follow up : PE   88 yo female never smoker seen for pulmonary consult 10/22/21 for bronchiectasis and shortness of breath found to have Pulmonary embolism on VQ scan .  Medical history of severe scoliosis /kyphosis , OSA -CPAP intolerant   TEST/EVENTS :  VQ scan 10/25/21- multiple moderate perfusion defects with RML/RLL + PE.  Venous Doppler 09/2021 neg  Echo Nml EF , Gr 2 DD, Elevated PAP 68mHg. Severe LAE and Mod RA, mod to severe MR   Patient returns for a 1 week follow up . Recently seen for pulmonary consult for bronchiectasis and shortness of breath. Labs showed elevated  D Dimer . Subsequent VQ scan on 10/25/21 was positive for PE. 2 D echo showed mild elevated PAP at 32mg, Gr 2 DD , Severe LAE and Mod RA, mod to severe MR. Venous doppler was negative for DVT.  Has some minimally productive dry cough. Was started on Spiriva but did not take.  She was started on Eliquis , she has started on starter pack .  Unfortunately patient's insurance does not cover and prescription is greater than $600.  We discussed patient assistance program.  We have also contacted our pharmacy department to help us[place] alternative options.  Patient education on anticoagulation therapy.  Patient says she has some minimum daily cough.  She was started on Spiriva last visit.  Unfortunately did not start this.  We discussed the reason for Spiriva.  Went over her recent CT chest that shows some bronchiectasis.  Autoimmune labs did show elevated rheumatoid factor and ANA, CCP was negative.  Patient has some mild arthritis but no significant joint swelling.  Sjogren's panel was negative. Typically active with light walking. Limited with back pain with scoliosis.  Lives alone. Drives. Does light housework , shopping. Has 4 kids, lots of grandkids.   11/22/2021 Follow Up PE/ Bronchiectasis/ Exertional dyspnea  Janet Nguyen a [age].o. female never  smoker seen for pulmonary consult 10/22/21 for bronchiectasis and shortness of breath. Additionally she was  found to have Pulmonary embolism on VQ scan . PE is unprovoked.  May need to consider lifelong anticoagulation or at least for 6 months to 1 year of therapy.  Will need to weigh the risk and benefit ratio.She is followed by Dr. RaChase Caller Pt. Presents for follow up . She states she has been doing well. She has been compliant with her  Eliquis. No bleeding.  She does not use NSAIDS. We are working on getting her reduced cost medication through the pharmacy. She understands she cannot skip a dose of Eliquis. .We reviewed bleeding precautions and safe use of sharp objects, in addition to fall precautions. She denies any lower extremity swelling.No bruising noted  She does continue to have exertional dyspnea.  She states she has been compliant with her Spiriva. She states she is unsure if it is helping or not, but is  willing to try this for a bit longer.    She is using her flutter valve 8-10 times a day. 2-4 blows at a time.She states she has not been able to cough anything up. We will add Mucinex to see if this helps. We discussed that this is a daily maintenance for her bronchiectasis. She is very compliant. She has had many questions today, that we have addressed.    PFT's look restrictive, which is expected with her degree of scoliosis.  DLCO with mild/Moderate reduction  Lung volumes are slightly reduce  OV 11/28/2021  Subjective:  Patient ID: Janet Nguyen, female , DOB: Sep 04, 1932 , age 46 y.o. , MRN: [id number] , ADDRESS: [place] [id number] PCP Janet Cruel, MD Patient Care Team: Janet Cruel, MD as PCP - General (Family Medicine) Janet Sine, MD as PCP - Cardiology (Cardiology)  This Provider for this visit: Treatment Team:  Attending Provider: Brand Males, MD    11/28/2021 -   Chief Complaint  Patient presents with    Follow-up    Pt states she has been having problems with the cost of the Eliquis medication. States she took her last pill today 3/2. States that she has been more SOB than before.   #Multifactorial dyspnea  -Scoliosis, bronchiectasis seen on CT scan, mitral valve regurgitation grade 2 diastolic dysfunction and pulmonary embolism January 2023   #pulm embolism diagnosed January 2023 through VQ scan and high D-dimer  -On outpatient Eliquis  #Bronchiectasis diagnosis given genera 2023  -On Spiriva and flutter valve  #Right lower lobe lung nodule new 7.5 mm January 2023  -On surveillance  #Positive rheumatoid factor new diagnosis January 2023  -On surveillance   HPI Janet Nguyen 88 y.o. -returns for follow-up.  I saw her in January 2023.  At that time diagnosis of pulmonary embolism was made.  She is here for follow-up.  Since then she is seen nurse practitioner.  She is taking Eliquis.  Recently today she is her last day of Eliquis.  She needs to pay for Eliquis.  It is over $500.  She is struggle to come to this decision.  Pharmacy team has been in communication with me.  We had a conversation.  At first she said she did not want to pay for the Eliquis.  It appears to have done extensive research and only warfarin would be cheap and affordable.  We went through the relative risk can inconvenience features between DOAC and Warfarin.  She then decided to go with DOAC and making the more expensive payment.  She then also discussed the difference between Eliquis and Xarelto.  She believes Xarelto could be risky compared to Eliquis.  So she decided to stick with Eliquis but she asked the pharmacy team to investigate and give her an updated information.    She does have other complaints.  She is concerned about her carotid artery.  In 2017 she might of had some mild carotid stenosis according to the ultrasound report.  I asked her to follow-up with primary care physician.  She wanted to discuss  cardiac issues of mitral valve regurgitation and grade 2 diastolic dysfunction.  Have asked her to discuss this with Dr. [name] but did indicate that it is contributing to shortness of breath.  She has positive rheumatoid factor and we said we will address it at a future visit.  She does have lung nodules and she is due for a follow-up CT in  the spring 2023.  She has bronchiectasis and she said the Spiriva and flutter valve might not be helping but for now she is going to continue those.    CT Chest data  No results found.       OV 01/24/2022  Subjective:  Patient ID: Janet Nguyen, female , DOB: 03/02/1932 , age 88 y.o. , MRN: [id number] , ADDRESS: [place] [id number] PCP Janet Cruel, MD Patient Care Team: Janet Cruel, MD as PCP - General (Family Medicine) Janet Sine, MD as PCP - Cardiology (Cardiology)  This Provider for this visit: Treatment Team:  Attending Provider: Brand Males, MD    01/24/2022 -   Chief Complaint  Patient presents with   Follow-up    Pt states she has been doing okay since last visit. States she has been in and out of the hospital since last visit.    #Multifactorial dyspnea  -Scoliosis, bronchiectasis seen on CT scan, mitral valve regurgitation grade 2 diastolic dysfunction, atrial flutter and pulmonary embolism January 2023   #pulm embolism diagnosed January 2023 through VQ scan and high D-dimer  -On outpatient Eliquis  #Bronchiectasis diagnosis given genera 2023  -On Spiriva and flutter valve  #Right lower lobe lung nodule new 7.5 mm January 2023  -On surveillance  #Positive rheumatoid factor new diagnosis January 2023  -On surveillance  HPI Janet Nguyen 88 y.o. -returns for follow-up.  She continues on Eliquis.  She is tolerating it well.  Issues that it is very expensive she is spending more than $5 per month.  She is going to work with [employer] company trying to reduce the  cost.  I talked about limiting the Eliquis to 6 months and then going on baby aspirin based on D-dimer.  However she says she wants to work with [employer] and try to reduce the cost.  She prefers to take Eliquis as long as medically indicated.  Shortness of breath wise she is stable.  She recently was admitted for diastolic dysfunction heart failure and atrial flutter in March 2023.  I reviewed the chart.  This followed a 10 pound weight gain.  She is back to feeling baseline.    Regarding her rheumatoid factor positivity: She says she does not have much of morning stiffness and joint mobility is pretty good therefore she wants to hold off on seeing rheumatologist.    Regarding emphysem and mild bronchiectasis: She is again not using the Spiriva and flutter valve.  She again tells [place] that she has not been instructed clearly.  We actually had her for a second time and had to give instructions.  She does not recommend this.  The CMA went in and showed her the devices Spiriva Respimat.  Then she remembered it.  We gave her instructions again.  She wants to try empiric Spiriva for a time-limited trial along with flutter valve and then see if this helps her shortness of breath  Lung nodule: She has not had a 24-monthCT and April 2023.  Radiologist recommended endograft 3-669-montho we will get it accordingly.        OV 04/17/2022  Subjective:  Patient ID: Janet Linkfemale , DOB: 02/1932-02-23 age 88.o. , MRN: [id number] [address] [id number] RoLawerance CruelMD Patient Care Team: RoLawerance CruelMD as PCP - General (Family Medicine) KeTroy SineMD as PCP - Cardiology (Cardiology)  This Provider for this visit: Treatment Team:  Attending Provider: Brand Males, MD   04/17/2022 -   Chief Complaint  Patient presents with   Follow-up    Patient here to go over CT results.      HPI Janet Nguyen 88 y.o. -returns  for follow-up.  She continues on full dose Eliquis.  She is complaining about the cost of Eliquis.  She is wondering if this can be reduced.  She has completed 6 months of full dose treatment for pulmonary embolism.  However in the interim she has atrial fibrillation and she is on Eliquis for that.  I have return to Dr. [name] about this.  However she is continuing to have significant amount of symptoms.  She feels frustrated.  For.  She feels that nobody is listening to her and helping her address her symptoms.  However records indicate otherwise.  She also tells me that in terms of   #shortness of breath -this is not worse after the cardioversion.  She is unable to walk a few feet.  Particularly after April 2023.  Her weight was 113 pounds in April currently at 116 pounds but the weight fluctuates she states.  Also in the last 1 month  #Worsening pedal edema.  She is on Lasix and this is despite that.  She has significant varicose veins.  #Nonspecific side effects of Eliquis apart from being expensive she feels that she is "bleeding all over and she shows a body but there is no bruising or anything.  She pointed out to some discrete small macular heads of skin lesions and she says it is because of Eliquis.  #Sinus says she feels this is acting up.  She has palpitations.  She also has irritable bowel syndrome.  #Bronchiectasis she is already using a flutter valve.  She states we did not educate her currently.  Records show otherwise.  She is also not using her Spiriva.  She says she ran out.  #Lung nodule: She had CT scan of the chest shows the lung nodules resolved/very small.    CT Chest data 04/09/2022  Narrative & Impression  CLINICAL DATA:  Lung nodule.   EXAM: CT CHEST WITHOUT CONTRAST   TECHNIQUE: Multidetector CT imaging of the chest was performed following the standard protocol without IV contrast.   RADIATION DOSE REDUCTION: This exam was performed according to the departmental  dose-optimization program which includes automated exposure control, adjustment of the mA and/or kV according to patient size and/or use of iterative reconstruction technique.   COMPARISON:  10/09/2021, 07/01/2017.   FINDINGS: Cardiovascular: Atherosclerotic calcification of the aorta, aortic valve and coronary arteries. Enlarged pulmonic trunk and heart. No pericardial effusion.   Mediastinum/Nodes: Mediastinal lymph nodes are not enlarged by CT size criteria and appear similar to prior. Hilar regions are difficult to definitively evaluate without IV contrast. No axillary adenopathy. Esophagus is grossly unremarkable.   Lungs/Pleura: Scattered mucoid impaction. Central bronchiectasis. Scarring in the right middle lobe and lingula. New bilateral pleural effusions, moderate to large on the right and small to moderate on the left. Patchy ground-glass in the adjacent right lower lobe. Basilar septal thickening. Pulmonary nodules measure 4 mm or less in size, as on 10/09/2021 and likely benign. Previously described part solid nodule in right lower lobe is not visualized. Airway is unremarkable.   Upper Abdomen: Visualized portions of the liver and right adrenal gland are unremarkable. Left adrenal thickening, as before, no follow-up necessary. 1.8 cm fluid density lesion  in the upper pole left kidney, indicative of a cyst. No follow-up necessary. Visualized portions of the spleen, pancreas and stomach are unremarkable. Left lumbar hernia contains unobstructed colon.   Musculoskeletal: Degenerative changes in the spine with scoliosis. No worrisome lytic or sclerotic lesions.   IMPRESSION: 1. Previously described part solid right lower lobe nodule is not visualized. 2. Congestive heart failure. 3. Aortic atherosclerosis (ICD10-I70.0). Coronary artery calcification. 4. Enlarged pulmonic trunk, indicative of pulmonary arterial hypertension.     Electronically Signed   By:  Lorin Picket M.D.   On: 04/10/2022 15:07      No results found.   OV 07/21/2022  Subjective:  Patient ID: Janet Nguyen, female , DOB: 04-27-1932 , age 41 y.o. , MRN: [id number] , ADDRESS: [place] [id number] PCP Janet Cruel, MD Patient Care Team: Janet Cruel, MD as PCP - General (Family Medicine) Janet Sine, MD as PCP - Cardiology (Cardiology) [place], Tami Lin, [place] as Physician Assistant (Cardiology)  This Provider for this visit: Treatment Team:  Attending Provider: Brand Males, MD    07/21/2022 -   Chief Complaint  Patient presents with   Follow-up    PT wants instructions on how to use flutter and inhaler States itchy skin, swollen legs, SOB for months Brought list of problems to discuss with Provider    #Multifactorial dyspnea  -Scoliosis, bronchiectasis seen on CT scan, mitral valve regurgitation grade 2 diastolic dysfunction, atrial flutter and pulmonary embolism January 2023   #pulm embolism diagnosed January 2023 through VQ scan and high D-dimer [normal Dopplers] #Atrial flutter managed by Dr. [name]  -On outpatient Eliquis -since January 2023: Switch to low-dose Eliquis September 2023 for risk mitigation  #Bronchiectasis diagnosis given genera 2023   - Spiriva samples given earlier in the year 2023 but did not use due to cost and lack of efficacy  -Flutter valve recommended but only with sporadic resultant use.  #Radon exposure #Right lower lobe lung nodule new 7.5 mm January 2023 -there is a summer 2023  -On surveillance/expectant follow-up  #Positive rheumatoid factor new diagnosis January 2023  -On surveillance  #Remote history of epistaxis resulting in emergency room visit posing a concern for patient on Eliquis  #Chronic pedal edema with decompensation summer 2023 right greater than left -Long history of varicose veins and cor pulmonale considered etiologies  HPI Janet Nguyen 88 y.o.  -returns for follow-up.  In this visit she is frustrated by all her health issues.  She is frustrated that doctors are not like the way it used to be many years ago.  She feels her problems are not being addressed.  She feels she does not have a good handle on her health issues because of lack of clear-cut communication from healthcare providers.  I did remind her that all the issues have been addressed and talk to her.  She had a list of questions all of which she asked in the past and we were answered.  I did wonder about that.  It appears the fact she lives alone is causing excess distress to her because she is 88 years old.  She acknowledged that when asked her about it.  She has 2 children living in [place] but it appears that the relationship may not be tight.  There is a daughter who is nearing retirement living in [place] which checks in on her more frequently.  However the son in [place] is the healthcare power of  attorney.  They have never come for the visit but she says she has appraised them.  Her current symptoms are listed below.  I told her that I believe she is currently at baseline and the best way forward is not expected to go of her condition but she needs to learn to accept what Is permanent baseline symptoms and the fact some of the symptoms might not be reversible.  She needs to learn to recognize variation and then report on the variation.  Based on that she fill out the [facility] symptom assessment score.  Also gave her a sheet to take home so she can monitor her status.  In terms of her shortness of breath this is stable  In terms of anticoagulation she is taking Eliquis.  It is now at low-dose in September 2023.  She wants to know if she can stop it.  I told her January 2024 it will be a year since pulmonary embolism and she can probably stop it but she has atrial flutter and therefore the long-term decision will now depend on Dr. [name]  She is worried  about recurrent epistaxis because of the Eliquis.  She had severe epistaxis many years ago and ended up in the emergency room.  Some weeks ago she had 1 small bout of epistaxis versus oral bleeding.  She brought a pillow covered to show that.  Since then it has not recurred.  I told her if it recurs she can call us back or Dr. [name].   In terms of her pedal edema: The right lower extremity is more swollen than the left.  Did explain to her the multifactorial nature of this.  She is not interested in getting a Doppler of the leg.  It is better than before and therefore we opted to watch this   Lung nodule: She had a previous lung nodule that resolved.  She did find out the area where she has been living for 34 years there is increased rate on exposure.  At this point in time we resolved just to monitor this.        [place] Symptom Assessment Numerical Scale 0 is no problem -> 10 worst problem 07/21/2022    No Pain -> Worst pain 0  No Tiredness -> Worset tiredness 4  No Nausea -> Worst nausea 2  No Depression -> Worst depression 8  No Anxiety -> Worst Anxiety 8  No Drowsiness -> Worst Drowsiness 8  Best appetite-> Worst Appetitle 4  Best Feeling of well being -> Worst feeling 8  No dyspnea-> Worst dyspnea 2  Other problem (none -> severe) RLE > LLE Edema  Othre issues Epistaxis mild x 1  Completed by  patoient        PFT     Latest Ref Rng & Units 10/22/2021   11:26 AM  PFT Results  FVC-Pre L 1.79   FVC-Predicted Pre % 89   FVC-Post L 1.78   FVC-Predicted Post % 89   Pre FEV1/FVC % % 73   Post FEV1/FCV % % 76   FEV1-Pre L 1.30   FEV1-Predicted Pre % 89   FEV1-Post L 1.36   DLCO uncorrected ml/min/mmHg 10.42   DLCO UNC% % 61   DLCO corrected ml/min/mmHg 10.42   DLCO COR %Predicted % 61   DLVA Predicted % 84   TLC L 4.21   TLC % Predicted % 89   RV % Predicted % 101        has  a past medical history of Arrhythmia, Atrial flutter ([place]), Celiac disease, GERD  (gastroesophageal reflux disease), Hypertension, Intervertebral disc stenosis of neural canal of cervical region, Irregular heart beat (11/30/2012), Osteoporosis, PMR (polymyalgia rheumatica) ([place]), Scoliosis, Scoliosis, and Sleep apnea (10/02/11 [facility]).   reports that she has never smoked. She has never used smokeless tobacco.  Past Surgical History:  Procedure Laterality Date   APPENDECTOMY     ruptured at age 61 and had surgery   CARDIAC CATHETERIZATION  01/27/06   CARDIOVERSION N/A 01/08/2022   Procedure: CARDIOVERSION;  Surgeon: Freada Bergeron, MD;  Location: [facility]COPY;  Service: Cardiovascular;  Laterality: N/A;   cataract surgery  2015   Dr. Herbert Deaner; March & April 2015    Allergies  Allergen Reactions   Gluten Meal Other (See Comments)    Unknown   Naproxen Other (See Comments)    Stomach upset    Immunization History  Administered Date(s) Administered   Fluad Quad(high Dose 65+) 07/11/2022   Influenza Split 06/26/2014   Influenza, High Dose Seasonal PF 06/15/2015, 06/19/2016, 07/24/2017, 05/24/2019, 06/20/2021   Influenza,inj,quad, With Preservative 06/29/2018   Influenza-Unspecified 06/30/2013   PFIZER(Purple Top)SARS-COV-2 Vaccination 10/14/2019, 11/04/2019, 07/18/2020, 07/01/2021   Pneumococcal Conjugate-13 03/09/2014   Tdap 03/21/2009, 05/29/2021    Family History  Problem Relation Age of Onset   Breast cancer Mother    Heart disease Father    Migraines Neg Hx      Current Outpatient Medications:    acetaminophen (TYLENOL) 500 MG tablet, Take 500 mg by mouth every 8 (eight) hours as needed for moderate pain., Disp: , Rfl:    ALPRAZolam (XANAX) 0.5 MG tablet, Take 0.25-0.5 mg by mouth 2 (two) times daily as needed for anxiety., Disp: , Rfl:    apixaban (ELIQUIS) 2.5 MG TABS tablet, Take 1 tablet (2.5 mg total) by mouth 2 (two) times daily., Disp: 180 tablet, Rfl: 3   Biotin w/ Vitamins C & E (HAIR SKIN & NAILS GUMMIES PO), Take 1  tablet by mouth daily., Disp: , Rfl:    Calcium Carb-Ergocalciferol (CHEWABLE CALCIUM/D PO), Take 1 each by mouth daily., Disp: , Rfl:    clidinium-chlordiazePOXIDE (LIBRAX) 5-2.5 MG capsule, Take 1 capsule by mouth daily as needed., Disp: 60 capsule, Rfl: 3   digoxin (LANOXIN) 0.125 MG tablet, TAKE 1/2 TABLET BY MOUTH DAILY, Disp: 15 tablet, Rfl: 3   diltiazem (CARDIZEM CD) 120 MG 24 hr capsule, Take 1 capsule (120 mg total) by mouth every morning., Disp: , Rfl:    metoprolol succinate (TOPROL-XL) 50 MG 24 hr tablet, TAKE 1 AND 1/2 TABLETS BY MOUTH TWO TIMES A DAY, Disp: 270 tablet, Rfl: 3   Phenylephrine-APAP-guaiFENesin (EQ SINUS CONGESTION & PAIN PO), Take 1 tablet by mouth daily as needed (for sinus pain). Contains acetaminophen 325 mg and Phenylephrine 5 mg, Disp: , Rfl:    polyethylene glycol powder (GLYCOLAX/MIRALAX) 17 GM/SCOOP powder, Take 17 g by mouth daily as needed for moderate constipation., Disp: , Rfl:    Tiotropium Bromide Monohydrate (SPIRIVA RESPIMAT) 1.25 MCG/ACT AERS, Inhale 2 puffs into the lungs daily., Disp: 4 g, Rfl: 5   zolpidem (AMBIEN) 5 MG tablet, Take 2.5 mg by mouth at bedtime., Disp: , Rfl:    Aloe-Sodium Chloride (AYR SALINE NASAL GEL NA), Place 1 application. into the nose daily as needed (rhinitis). (Patient not taking: Reported on 07/21/2022), Disp: , Rfl:    fluticasone (FLONASE) 50 MCG/ACT nasal spray, Place 1 spray into both nostrils daily as needed  for allergies or rhinitis. (Patient not taking: Reported on 07/21/2022), Disp: , Rfl:    furosemide (LASIX) 20 MG tablet, Take 20 mg (1 tablet) every Monday, Wednesday, Friday, Sunday, then 40 mg (2 tablet) every Tuesday, Thursday, Saturday. (Patient not taking: Reported on 07/21/2022), Disp: 90 tablet, Rfl: 3   mirtazapine (REMERON) 15 MG tablet, Take 15 mg by mouth at bedtime. (Patient not taking: Reported on 07/21/2022), Disp: , Rfl:    potassium chloride (KLOR-CON M) 10 MEQ tablet, Take 1 tablet (10 mEq total) by  mouth daily., Disp: 90 tablet, Rfl: 3      Objective:   Vitals:   07/21/22 1129  BP: (!) 114/56  Pulse: 78  Temp: 97.7 F (36.5 C)  TempSrc: Oral  SpO2: 97%  Weight: 116 lb 12.8 oz (53 kg)  Height: 5' 1"  (1.549 m)    Estimated body mass index is 22.07 kg/m as calculated from the following:   Height as of this encounter: 5' 1"  (1.549 m).   Weight as of this encounter: 116 lb 12.8 oz (53 kg).  @WEIGHTCHANGE @  [employer]   07/21/22 1129  Weight: 116 lb 12.8 oz (53 kg)     Physical Exam    General: No distress. Looks wll Neuro: Alert and Oriented x 3. GCS 15. Speech normal Psych: Pleasant Resp:  Barrel Chest - no.  Wheeze - n, Crackles - no, No overt respiratory distress CVS: Normal heart sounds. Murmurs - no Ext: Stigmata of Connective Tissue Disease - No RLE  > LLE - chronic edema HEENT: Normal upper airway. PEERL +. No post nasal drip        Assessment:       ICD-10-CM   1. DOE (dyspnea on exertion)  R06.09     2. Bronchiectasis without complication ([place])  I09.7     3. Pulmonary embolism, other, unspecified chronicity, unspecified whether acute cor pulmonale present ([place])  I26.99          Plan:     Patient Instructions  DOE (dyspnea on exertion)   - scoliosis, bronchiectasis, mitral valve leak and and grade 2 heart muscle stiffness, atrial flutter contributing to respiratory symptoms - stable   Plan -Supportive care - Address individual components below  #Pulmonary embolism diagnosed January 2023  - no recurrence since oroginal onset. Understand eliquis is expensive. Have completed 6 months as of July 2023. In sept 2023 dropped to low dose eliquis  Plan  -Cotninue eliquis 2.20m twice daily  - From PE perspective you can probably stop it In Jan 2024 but you probably need it for your A Fib - talk to Dr KClaiborne Billingsabout need to continue eliquis v stop eliquis  #possible epistaxis recurrence at night  Plan - keep any eye on bleeding atg  night or any bleeding - any issues call Dr KClaiborne Billingsor our office    #Bronchiectasis  -Spiriva sample months ago did not help and was too expensive  Plan  - Flutter valve 5 times daily - No need for spiriva  Right lower lobe lung nodule 7.552mon CT 10/09/21 - new. Resolved nodule July 2023 Radon Exposure x 34 years - newly discovered   Plan  - CT if clinical need   Mitral valve insufficiency, unspecified etiology Grade II diastolic dysfunction ATrial flutter   - concerned your might have decompensation and congestion of your heart muscle function  Plan' -  repeat echo timing by cardiology  #history of sleep apnea with prior intolerance to cPAP in  2015-2017  Plan  = expectant followup  #Pedeal Edema R > Left  since summer 2023  - due to varicose veins and heart and lung issues  Plan  - management by PCP Janet Cruel, MD and cardiology   Positive rheumatoid factor January 2023  - shared decisio  in July 2023 making we wil follow expectantly since joints are okay   Plan  - At some point we can discuss visiting rheumatologist but hold off for the moment  Overall  - problems cannot be fixed. I think you are at baseline. Extra stress level due to  living alone and limited contact with children in [place], [place]. Son is [place] - do ESAS score and track it once a month - accept the baseline  -monitor for variation - keep family in loop  Followup - return in 3 months with Dr Chase Caller - 30 min visit  ( Level 05 visit: Estb 40-54 min  in  visit type: on-site physical face to visit  in total care time and counseling or/and coordination of care by this undersigned MD - Dr Janet Nguyen. This includes one or more of the following on this same day 07/21/2022: pre-charting, chart review, note writing, documentation discussion of test results, diagnostic or treatment recommendations, prognosis, risks and benefits of management options,  instructions, education, compliance or risk-factor reduction. It excludes time spent by the [facility] or office staff in the care of the patient. Actual time 14 min)   SIGNATURE    Dr. Brand Nguyen, M.D., F.C.C.P,  Pulmonary and Critical Care Medicine Staff Physician, [place] Director - Interstitial Lung Disease  Program  Pulmonary [place] at [place], [place], [address]  Pager: [phone], If no answer or between  15:00h - 7:00h: call [phone] Telephone: [phone]  12:34 PM 07/21/2022

## 2022-07-22 ENCOUNTER — Encounter: Payer: Self-pay | Admitting: Physical Therapy

## 2022-07-22 ENCOUNTER — Ambulatory Visit: Payer: Medicare Other | Admitting: Physical Therapy

## 2022-07-22 DIAGNOSIS — M546 Pain in thoracic spine: Secondary | ICD-10-CM

## 2022-07-22 DIAGNOSIS — M4125 Other idiopathic scoliosis, thoracolumbar region: Secondary | ICD-10-CM

## 2022-07-22 DIAGNOSIS — M542 Cervicalgia: Secondary | ICD-10-CM

## 2022-07-22 DIAGNOSIS — M6281 Muscle weakness (generalized): Secondary | ICD-10-CM | POA: Diagnosis not present

## 2022-07-22 DIAGNOSIS — R293 Abnormal posture: Secondary | ICD-10-CM

## 2022-07-22 NOTE — Progress Notes (Signed)
Cardiology Office Note:    Date:  07/24/2022   ID:  Janet, Nguyen 07-01-1932, MRN [id number]  PCP:  Lawerance Cruel, [facility] Cardiologist:  Shelva Majestic, MD Cardiology APP:  Ledora Bottcher, [place]     Referring MD: Lawerance Cruel, MD   Chief Complaint  Patient presents with   Follow-up   Shortness of Breath   Edema    Legs.    History of Present Illness:    Janet Nguyen is a 88 y.o. female with a hx of SVT, PACs, PVCs treated with beta-blocker therapy, OSA not on CPAP, scoliosis, GERD, optical migraines, mild nonobstructive carotid artery disease, and chronic chest pain related to her posture from scoliosis.  She was referred to lumbar pulmonology for bronchiectasis and shortness of breath.  VQ scan showed positive PE 10/25/2021.  She was started on Eliquis and Spiriva.  She was diagnosed with atrial fibrillation during a hospitalization in March 2023.  Echocardiogram at that time revealed LVEF 60-65% moderate biatrial enlargement, and small circumferential pericardial effusion.  She was seen in follow-up in May 2023 with Dr. [name] and noted to be back in A-fib.  Cardizem was increased to 180 mg daily and metoprolol 75 mg twice daily was continued.  Given her pulmonary history, she is felt to not be a candidate for amiodarone.  She was referred to A-fib clinic.  Tikosyn load was planned but she called back and canceled the scheduled hospital admission.  She also self reduced her Cardizem to 120 mg due to questionable stomach upset.  In follow-up, rate control strategy was decided upon and digoxin was added to her regimen.  She has also seen Dr. Chase Caller back and has not been satisfied with her appointments.  CT CAT scan did show enlarged pulmonary artery suggestive of pulmonary artery hypertension.  BNP in March was 640, pro-BNP 03/2022 was 870.  Due to bilateral lower extremity edema, Dr. [name] increased her dose of Lasix from 20 mg to 40 mg  every other day.  I saw her in follow up 06/05/22 with improved LE swelling. She continued 40 mg lasix daily. She was frustrated at not being able to continue yoga in the floor. Weight between 1113-119 lbs - I reduced her dose of eliquis to 2.5 mg BID.Marland Kitchen She reports palpitations at night when going to bed.   She presents back for scheduled follow up. She saw Dr. Chase Caller this week and was told from a PE perspective she could stop eliquis in Jan 2024, but may still need for Afib.   She brings a printed out excel sheet of questions. We review her medications at length.  She thinks that her lower extremity swelling has gone down on 40 mg Lasix daily.  She is frustrated that she still has swelling on the right leg.  She does have venous reflux.  I do not think that this is can improve any further with higher dose of Lasix.  She is wearing compression socks today.  She is concerned about Eliquis and bleeding risks.  We discussed dry eye dry mouth possible nosebleeds, and bruising.  She is frustrated with her nail health, will refer to dermatology.  She is frustrated with possible stool incontinence, will refer to GI.  Her prior dermatologist and gastroenterologist have retired.  She denies chest pain.  She only has palpitations when lying in bed to sleep.  Her scoliosis seems to be progressing and is compressing her intestines.  From a cardiovascular standpoint, I think she is doing quite well.  She is very much in control of her health and medications.   Past Medical History:  Diagnosis Date   Arrhythmia    History of SVT with documented PVC'S and  PAC'S  12/08/12 Nuc stress test normal LV EF 74%  Event Monitor  12/01/12-01/03/13   Atrial flutter ([place])    Celiac disease    treated by Dr. [name]   GERD (gastroesophageal reflux disease)    Hypertension    Intervertebral disc stenosis of neural canal of cervical region    Irregular heart beat 11/30/2012   ECHO-EF 60-65%   Osteoporosis    PMR (polymyalgia  rheumatica) (HCC)    Dr. Marijean Bravo; pt states she was diagnosed 10-15 years ago, not treated at this time or any issues that she is aware of.   Scoliosis    Scoliosis    Sleep apnea 10/02/11 [facility]   Sleep study AHI -total sleep 10.3/hr  64.0/ hr during REM sleep.RDI 22.8/hr during total sleep 64.0/hr during REM sleep The lowest O2 sat during Non-REM and REM sleep was 86% and 88% respectively. 04/08/12 CPAP/BIPAP titration study [facility]    Past Surgical History:  Procedure Laterality Date   APPENDECTOMY     ruptured at age 85 and had surgery   CARDIAC CATHETERIZATION  01/27/06   CARDIOVERSION N/A 01/08/2022   Procedure: CARDIOVERSION;  Surgeon: Freada Bergeron, MD;  Location[facility] ENDOSCOPY;  Service: Cardiovascular;  Laterality: N/A;   cataract surgery  2015   Dr. Herbert Deaner; March & April 2015    Current Medications: Current Meds  Medication Sig   acetaminophen (TYLENOL) 500 MG tablet Take 500 mg by mouth every 8 (eight) hours as needed for moderate pain.   Aloe-Sodium Chloride (AYR SALINE NASAL GEL NA) Place 1 application  into the nose daily as needed (rhinitis).   ALPRAZolam (XANAX) 0.5 MG tablet Take 0.25-0.5 mg by mouth 2 (two) times daily as needed for anxiety.   apixaban (ELIQUIS) 2.5 MG TABS tablet Take 1 tablet (2.5 mg total) by mouth 2 (two) times daily.   apixaban (ELIQUIS) 5 MG TABS tablet Take 1 tablet (5 mg total) by mouth 2 (two) times daily.   Biotin w/ Vitamins C & E (HAIR SKIN & NAILS GUMMIES PO) Take 1 tablet by mouth daily.   Calcium Carb-Ergocalciferol (CHEWABLE CALCIUM/D PO) Take 1 each by mouth daily.   clidinium-chlordiazePOXIDE (LIBRAX) 5-2.5 MG capsule Take 1 capsule by mouth daily as needed.   digoxin (LANOXIN) 0.125 MG tablet TAKE 1/2 TABLET BY MOUTH DAILY   diltiazem (CARDIZEM CD) 120 MG 24 hr capsule Take 1 capsule (120 mg total) by mouth every morning.   fluticasone (FLONASE) 50 MCG/ACT nasal spray Place 1 spray  into both nostrils daily as needed for allergies or rhinitis.   furosemide (LASIX) 20 MG tablet Take 20 mg (1 tablet) every Monday, Wednesday, Friday, Sunday, then 40 mg (2 tablet) every Tuesday, Thursday, Saturday.   metoprolol succinate (TOPROL-XL) 50 MG 24 hr tablet TAKE 1 AND 1/2 TABLETS BY MOUTH TWO TIMES A DAY   mirtazapine (REMERON) 15 MG tablet Take 15 mg by mouth at bedtime.   Phenylephrine-APAP-guaiFENesin (EQ SINUS CONGESTION & PAIN PO) Take 1 tablet by mouth daily as needed (for sinus pain). Contains acetaminophen 325 mg and Phenylephrine 5 mg   polyethylene glycol powder (GLYCOLAX/MIRALAX) 17 GM/SCOOP powder Take 17 g by mouth daily as needed for moderate  constipation.   Tiotropium Bromide Monohydrate (SPIRIVA RESPIMAT) 1.25 MCG/ACT AERS Inhale 2 puffs into the lungs daily.   zolpidem (AMBIEN) 5 MG tablet Take 2.5 mg by mouth at bedtime.     Allergies:   Gluten meal and Naproxen   Social History   Socioeconomic History   Marital status: Divorced    Spouse name: Not on file   Number of children: 4   Years of education: college    Highest education level: Not on file  Occupational History   Occupation: retired   Tobacco Use   Smoking status: Never   Smokeless tobacco: Never   Tobacco comments:    Never smoke 12/12/21  Vaping Use   Vaping Use: Never used  Substance and Sexual Activity   Alcohol use: Yes    Alcohol/week: 3.0 - 4.0 standard drinks of alcohol    Types: 3 - 4 Glasses of wine per week    Comment: 1 glass of wine 3-4 times weekly 12/12/21   Drug use: No   Sexual activity: Not on file  Other Topics Concern   Not on file  Social History Narrative   Lives alone at home   Drinks tea but tries to drink decaf   Has 9 grandchildren    Right handed   Social Determinants of Health   Financial Resource Strain: Low Risk  (12/03/2021)   Overall Financial Resource Strain (CARDIA)    Difficulty of Paying Living Expenses: Not hard at all  Food Insecurity: No Food  Insecurity (12/03/2021)   Hunger Vital Sign    Worried About Running Out of Food in the Last Year: Never true    Ran Out of Food in the Last Year: Never true  Transportation Needs: No Transportation Needs (12/03/2021)   PRAPARE - Hydrologist (Medical): No    Lack of Transportation (Non-Medical): No  Physical Activity: Not on file  Stress: Not on file  Social Connections: Not on file     Family History: The patient's family history includes Breast cancer in her mother; Heart disease in her father. There is no history of Migraines.  ROS:   Please see the history of present illness.     All other systems reviewed and are negative.  EKGs/Labs/Other Studies Reviewed:    The following studies were reviewed today:  Echo 12/03/21:  1. Left ventricular ejection fraction, by estimation, is 60 to 65%. Left  ventricular ejection fraction by 2D MOD biplane is 60.8 %. The left  ventricle has normal function. The left ventricle has no regional wall  motion abnormalities. There is mild left  ventricular hypertrophy. Left ventricular diastolic function could not be  evaluated.   2. Left atrial size was moderately dilated.   3. Right atrial size was moderately dilated.   4. The pericardial effusion is circumferential. There is no evidence of  cardiac tamponade.   5. The mitral valve is myxomatous. Moderate mitral valve regurgitation.  There is moderate late systolic prolapse of the middle scallop of the  posterior leaflet of the mitral valve.   6. Tricuspid valve regurgitation is moderate.   7. The aortic valve is tricuspid. Aortic valve sclerosis is present, with  no evidence of aortic valve stenosis.   8. There is normal pulmonary artery systolic pressure. The estimated  right ventricular systolic pressure is 98.9 mmHg.   9. The inferior vena cava is dilated in size with >50% respiratory  variability, suggesting right atrial pressure of 8 mmHg.  EKG:  EKG is   ordered today.  The ekg ordered today demonstrates atrial fibrillation with  VR 82, septal Q waves  Recent Labs: 01/29/2022: Magnesium 1.9; TSH 0.980 04/17/2022: Hemoglobin 12.6; Platelets 205.0; Pro B Natriuretic peptide (BNP) 870.0 05/14/2022: ALT 21; BNP 515.3 06/06/2022: BUN 20; Creatinine, Ser 1.07; Potassium 4.7; Sodium 137  Recent Lipid Panel    Component Value Date/Time   CHOL 154 12/03/2016 0845   TRIG 81 12/03/2016 0845   HDL 67 12/03/2016 0845   CHOLHDL 2.3 12/03/2016 0845   VLDL 16 12/03/2016 0845   LDLCALC 71 12/03/2016 0845     Risk Assessment/Calculations:    CHA2DS2-VASc Score = 6   This indicates a 9.7% annual risk of stroke. The patient's score is based upon: CHF History: 1 HTN History: 1 Diabetes History: 0 Stroke History: 0 Vascular Disease History: 1 (PE) Age Score: 2 Gender Score: 1       Physical Exam:    VS:  BP 110/80 (BP Location: Left Arm, Patient Position: Sitting, Cuff Size: Normal)   Pulse 88   Ht 5' 1"  (1.549 m)   Wt 120 lb (54.4 kg)   BMI 22.67 kg/m     Wt Readings from Last 3 Encounters:  07/24/22 120 lb (54.4 kg)  07/21/22 116 lb 12.8 oz (53 kg)  06/05/22 119 lb (54 kg)     GEN:  Well nourished, well developed in no acute distress HEENT: Normal NECK: No JVD; No carotid bruits LYMPHATICS: No lymphadenopathy CARDIAC: irregular rhythm, regular rate RESPIRATORY:  Clear to auscultation without rales, wheezing or rhonchi  ABDOMEN: Soft, non-tender, non-distended MUSCULOSKELETAL:  LE swelling on right SKIN: Warm and dry NEUROLOGIC:  Alert and oriented x 3 PSYCHIATRIC:  Normal affect   ASSESSMENT:    1. Persistent atrial fibrillation ([place])   2. Frequent PVCs   3. SVT (supraventricular tachycardia)   4. Chronic anticoagulation   5. History of pulmonary embolism   6. Bilateral lower extremity edema   7. Obstructive sleep apnea   8. Mitral valve insufficiency, unspecified etiology   9. Fingernail abnormalities   10. Incontinence  of feces, unspecified fecal incontinence type    PLAN:    In order of problems listed above:  Persistent atrial fibrillation SVT, PACs, PVCs Rate controlled on metoprolol, Cardizem 120 mg, digoxin - digoxin level 06/06/22 was < 0.4 She wishes to repeat labs in December when she sees Dr. [name]   History of PE, clear on recent CT chest Chronic anticoagulation No bleeding issues on Eliquis She is currently on 5 mg Eliquis twice daily.  However, given her age and weight of 55 kg I will reduce this to 2.5 mg twice daily We will continue Eliquis given her PAF I have given some samples today, plan to give samples in December at follow-up   Moderate MR Bilateral lower extremity edema BNP 515 Stable on 40 mg lasix daily BMP stable on recheck 06/06/2022 She wishes to repeat labs when she comes for follow-up-consider BMP, BNP, digoxin level   OSA not on CPAP Has mouth guard but does not use   Dry eye Dry mouth Dry skin - [place], biotene, humidifier, no fans   Fingernail concerns - on biotin Refer to dermatology   Concern for stool incontinence - infrequent Refer to [facility]        Follow up with Dr. [name] as scheduled.   Medication Adjustments/Labs and Tests Ordered: Current medicines are reviewed at length with the patient today.  Concerns  regarding medicines are outlined above.  Orders Placed This Encounter  Procedures   Ambulatory referral to Gastroenterology   Ambulatory referral to Dermatology   EKG 12-Lead   Meds ordered this encounter  Medications   apixaban (ELIQUIS) 5 MG TABS tablet    Sig: Take 1 tablet (5 mg total) by mouth 2 (two) times daily.    Dispense:  42 tablet    Refill:  0    Patient Instructions  Medication Instructions:  No Changes *If you need a refill on your cardiac medications before your next appointment, please call your pharmacy*   Lab Work: No Labs If you have labs (blood work) drawn today and your tests are completely normal,  you will receive your results only by: [place] (if you have MyChart) OR A paper copy in the mail If you have any lab test that is abnormal or we need to change your treatment, we will call you to review the results.   Testing/Procedures: No Testing   Follow-Up: At [facility], you and your health needs are our priority.  As part of our continuing mission to provide you with exceptional heart care, we have created designated Provider Care Teams.  These Care Teams include your primary Cardiologist (physician) and Advanced Practice Providers (APPs -  Physician Assistants and Nurse Practitioners) who all work together to provide you with the care you need, when you need it.  We recommend signing up for the patient portal called "MyChart".  Sign up information is provided on this After Visit Summary.  MyChart is used to connect with patients for Virtual Visits (Telemedicine).  Patients are able to view lab/test results, encounter notes, upcoming appointments, etc.  Non-urgent messages can be sent to your provider as well.   To learn more about what you can do with MyChart, go to [email or web address].    Your next appointment:   Keep Scheduled Appointment  The format for your next appointment:   In Person  Provider:   Shelva Majestic, MD     Signed, Tami Lin [place], [place]  07/24/2022 11:15 AM    [place]

## 2022-07-22 NOTE — Therapy (Signed)
OUTPATIENT PHYSICAL THERAPY TREATMENT   Patient Name: Janet Nguyen MRN: [id number] DOB:11-04-1931, 88 y.o., female Today's Date: 07/22/2022   PT End of Session - 07/22/22 1259     Visit Number 3    Date for PT Re-Evaluation 08/28/22    Authorization Type Medicare Part A/B, KX at visit 15    Progress Note Due on Visit 10    PT Start Time 1230    PT Stop Time 1312    PT Time Calculation (min) 42 min    Activity Tolerance Patient tolerated treatment well    Behavior During Therapy [facility] for tasks assessed/performed               Past Medical History:  Diagnosis Date   Arrhythmia    History of SVT with documented PVC'S and  PAC'S  12/08/12 Nuc stress test normal LV EF 74%  Event Monitor  12/01/12-01/03/13   Atrial flutter (HCC)    Celiac disease    treated by Dr. 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 Marijean Bravo; pt states she was diagnosed 10-15 years ago, not treated at this time or any issues that she is aware of.   Scoliosis    Scoliosis    Sleep apnea 10/02/11 [facility]   Sleep study AHI -total sleep 10.3/hr  64.0/ hr during REM sleep.RDI 22.8/hr during total sleep 64.0/hr during REM sleep The lowest O2 sat during Non-REM and REM sleep was 86% and 88% respectively. 04/08/12 CPAP/BIPAP titration study [facility]   Past Surgical History:  Procedure Laterality Date   APPENDECTOMY     ruptured at age 63 and had surgery   CARDIAC CATHETERIZATION  01/27/06   CARDIOVERSION N/A 01/08/2022   Procedure: CARDIOVERSION;  Surgeon: Freada Bergeron, MD;  Location: [facility] ENDOSCOPY;  Service: Cardiovascular;  Laterality: N/A;   cataract surgery  2015   Dr. Herbert Deaner; March & April 2015   Patient Active Problem List   Diagnosis Date Noted    Secondary hypercoagulable state ([place]) 12/12/2021   Hyponatremia 12/04/2021   Acute CHF (congestive heart failure) ([place]) 12/02/2021   Acute on chronic diastolic CHF (congestive heart failure) ([place]) 12/02/2021   Atrial fibrillation with rapid ventricular response ([place]) 12/02/2021   Essential hypertension 12/02/2021   GERD without esophagitis 12/02/2021   Obstructive sleep apnea 12/02/2021   Interstitial lung disease ([place]) 12/02/2021   History of pulmonary embolism 12/02/2021   Pulmonary embolism ([place]) 11/06/2021   Bronchiectasis ([place]) 11/06/2021   Deviated septum 05/08/2020   Mass of subcutaneous tissue 05/02/2020   Vertigo 06/16/2019   Pulmonary hypertension, unspecified ([place]) 04/14/2019   Ischemic colitis ([place]) 04/14/2019   Migraine with aura and without status migrainosus, not intractable 11/08/2018   Degeneration of lumbar intervertebral disc 10/14/2018   Hoarseness of voice 03/04/2018   Abdominal pain 07/01/2017   Diverticulitis, colon    Metabolic acidosis, increased anion gap    Constipation 04/14/2017   Lumbar hernia 04/14/2017   Presbycusis of both ears 01/10/2017   Tinnitus aurium, bilateral 01/10/2017   Gastroesophageal reflux disease 08/20/2016   Hemoptysis 08/20/2016   Obstructive sleep apnea of adult 08/20/2016   Rhinitis, chronic 08/20/2016   Throat pain in adult 08/20/2016   Fatigue 12/23/2015   Sciatica  of right side 10/06/2015   History of migraine headaches 10/06/2015   Frequent PVCs 12/28/2013   Premature atrial contractions 12/28/2013   PSVT (paroxysmal supraventricular tachycardia) ([place]) 12/28/2013   Heart palpitations 07/13/2013   Sleep apnea 04/11/2013   Scoliosis 04/11/2013   Atypical atrial flutter ([place]) 11/29/2012   Chest pain, atypical 11/29/2012   Fibromyalgia syndrome 11/29/2012   Chronic steroid use 11/29/2012      REFERRING PROVIDER: Lawerance Cruel, MD   REFERRING DIAG: M54.9 (ICD-10-CM) - Back pain   Rationale for Evaluation and  Treatment Rehabilitation  THERAPY DIAG:  Other idiopathic scoliosis, thoracolumbar region  Abnormal posture  Muscle weakness (generalized)  Pain in thoracic spine  Cervicalgia  ONSET DATE: chronic LBP  SUBJECTIVE:                                                                                                                                                                                           SUBJECTIVE STATEMENT: I realize how behind I am from where I used to be when I was last in PT.   From evaluation: I have had a bad year.  I had PE, was hospitalized, have a swollen Rt leg, am now on Lasix, I'm in afib (cardioversion didn't stick).  Blood clot in leg was ruled out.  My leg swelling is much better than it was but still very swollen.  I am easily winded even when I'm talking.   I have lost 5 inches with my scoliosis.  I am so compressed and painful on my Lt side I have no room for my stomach so eating is very challenging.  I struggle to move my bowels.   I can only lay on my Rt side or my back.  Laying on my Rt side so much can start hurting my hip.   My neck and shoulders always hurt.   My activity level has diminished signif due to getting so easily winded.  I won't be able to do the NuStep given how winded I get.  I am trying to consistently get back into my stretches from last PT sessions.   PERTINENT HISTORY:  Pt has cardiac doctor, rheumatologist Complex medical history: of note October 25, 2021 was positive for PE, hospitalized on March 6 through December 04, 2021 with increased palpitations and was found to be in atrial fibrillation, cardioversion April 2023 was initially successful but not long term  Long history of pain from signif scoliosis, has been in/out of PT for this History of migraine headaches Sleep apnea without tolerance of CPAP, uses mouthguard  PAIN:  PAIN:  Are you having pain? Yes  NPRS scale: 5-6/10 Pain location: neck, back, shoulders, Rt hip Pain  orientation: Right and Bilateral  PAIN TYPE: aching, throbbing, and tight Pain description: constant  Aggravating factors: activity, laying on Rt side Relieving factors: rest    PRECAUTIONS: None  WEIGHT BEARING RESTRICTIONS No  FALLS:  Has patient fallen in last 6 months? No  LIVING ENVIRONMENT: Lives with: lives alone Lives in: Other single level condo Stairs: No Has following equipment at home: None  OCCUPATION: retired  PLOF: Independent with household mobility without device and Independent with community mobility without device  PATIENT GOALS open up Lt side of trunk, work back into my stretches, less pain   OBJECTIVE:   DIAGNOSTIC FINDINGS:  No new imaging    SCREENING FOR RED FLAGS: Bowel or bladder incontinence: No Spinal tumors: No Cauda equina syndrome: No Compression fracture: No Abdominal aneurysm: No  COGNITION:  Overall cognitive status: Within functional limits for tasks assessed     SENSATION: WFL  MUSCLE LENGTH: End range bil hamstring limited Lt lat limits full Lt shoulder flexion  POSTURE:  severe scoliosis, concave on Lt with lower ribcage and pelvis approximation  PALPATION: Diffuse tenderness cervical, thoracic soft tissues, very tight Lt latissimus dorsi, bil pectorals, Lt obliques and QL, Rt hip Non-pitting edema in Rt leg and foot present - under care of MD and DVT ruled out per chart review, on Lasix  CERVICAL ROM:   Active  A/PROM  eval  Flexion 57  Extension 35  Right lateral flexion 12  Left lateral flexion 15  Right rotation 40  Left rotation 40   (Blank rows = not tested)  LUMBAR ROM:   Active  A/PROM  eval  Flexion Fingers to mid shin  Extension Not tested  Right lateral flexion 5  Left lateral flexion 3  Right rotation 20%  Left rotation 20%   (Blank rows = not tested)  LOWER EXTREMITY ROM:     Passive  Right eval Left eval  Hip flexion    Hip extension    Hip abduction    Hip adduction    Hip  internal rotation 35 40  Hip external rotation 50 60  Knee flexion    Knee extension    Ankle dorsiflexion    Ankle plantarflexion    Ankle inversion    Ankle eversion     (Blank rows = not tested)  LOWER EXTREMITY MMT:    4/5 bil LE   GAIT: Distance walked: within clinic Assistive device utilized: None Level of assistance: Complete Independence Comments: gets very winded    TODAY'S TREATMENT  Date: 07/22/22 Rt SL: STM to Lt lateral trunk from pelvis to posterior shoulder with fascial release, elongation, cued deep slow inhale/exhale to open ribcage Supine: Hooklying bil overhead reach with single heel slide with long leg reach, alt LE x 10 SKTC x 5 each, then DKTC rock side to side x 10 Lower trunk rotation x 3 rounds Hamstring stretch bil 1x30" Sitting: upper quadrant STM bil to neck and shoulders  Date: 07/16/22 Manual:  elongation to Lt side, thoracic and pelvic and associated soft tissue.  Seated STM bil upper traps, Lt lat, Lt obliques, bil pectoral manual stretching   Eval 07/03/22: Seated STM bil upper traps, Lt lat, Lt obliques, bil pectoral manual stretching   PATIENT EDUCATION:  Education details: discussed plan of care and goals Person educated: Patient Education method: Explanation Education comprehension: verbalized understanding   HOME EXERCISE PROGRAM: Start next time to get back into  trunk and LE stretches  ASSESSMENT:  CLINICAL IMPRESSION: Pt arrived with less pain than last week.  She was less SOB within conversation.  Session focused on elongation of Lt trunk and release of upper quadrant tension.  Did get back into some supine ROM and stretches from past which were well tolerated.  Continue along POC.    OBJECTIVE IMPAIRMENTS decreased activity tolerance, decreased endurance, decreased mobility, decreased ROM, decreased strength, hypomobility, increased fascial restrictions, increased muscle spasms, impaired flexibility, impaired UE  functional use, improper body mechanics, postural dysfunction, and pain.   ACTIVITY LIMITATIONS carrying, lifting, bending, standing, sleeping, transfers, and reach over head  PARTICIPATION LIMITATIONS: cleaning, laundry, driving, shopping, community activity, and yard work  PERSONAL FACTORS Age and Time since onset of injury/illness/exacerbation are also affecting patient's functional outcome.   REHAB POTENTIAL: Good  CLINICAL DECISION MAKING: Stable/uncomplicated  EVALUATION COMPLEXITY: Low   GOALS: Goals reviewed with patient? Yes  SHORT TERM GOALS: Target date: 07/31/22  Pt will be ind with initial HEP for trunk and LE mobility and stretching Baseline: Goal status: ongoing    LONG TERM GOALS: Target date: 08/28/22  Pt will be ind with strategies for symptom management including pacing, spinal decompression, positioning, and HEP. Baseline:  Goal status: INITIAL  2.  Pt will improve bil hamstring length to WNL to reduce back pain Baseline:  Goal status: INITIAL  3.  Pt will be able to perform light daily tasks within home for up to 30 min before needing a positional rest break. Baseline:  Goal status: INITIAL  4.  Pt will achieve improved soft tissue mobility of Lt side of trunk and bil cervical region to reduce pain and decompress abdominal organs Baseline:  Goal status: INITIAL  5.  Improve neck rotation to at least 50 deg for improved visibility with driving. Baseline: 40 deg bil Goal status: INITIAL     PLAN: PT FREQUENCY: 1-2x/week  PT DURATION: 8 weeks  PLANNED INTERVENTIONS: Therapeutic exercises, Therapeutic activity, Neuromuscular re-education, Balance training, Patient/Family education, Self Care, Joint mobilization, Aquatic Therapy, Dry Needling, Electrical stimulation, Spinal mobilization, Cryotherapy, Moist heat, and Manual therapy.  PLAN FOR NEXT SESSION: manual techniques to open up Lt side of trunk, begin light stretching as tol for  hamstrings, QL, pectorals, Lt lat (wall slide in doorway), Pt with very limited cardiovascular endurance secondary to complex medical history so will likely not tol NuStep initially   [employer], PT 07/22/22 1:31 PM

## 2022-07-24 ENCOUNTER — Ambulatory Visit: Payer: Medicare Other | Admitting: Physical Therapy

## 2022-07-24 ENCOUNTER — Telehealth: Payer: Self-pay | Admitting: Physical Therapy

## 2022-07-24 ENCOUNTER — Ambulatory Visit: Payer: Medicare Other | Attending: Physician Assistant | Admitting: Physician Assistant

## 2022-07-24 ENCOUNTER — Encounter: Payer: Self-pay | Admitting: Physician Assistant

## 2022-07-24 VITALS — BP 110/80 | HR 88 | Ht 61.0 in | Wt 120.0 lb

## 2022-07-24 DIAGNOSIS — R600 Localized edema: Secondary | ICD-10-CM | POA: Diagnosis not present

## 2022-07-24 DIAGNOSIS — I493 Ventricular premature depolarization: Secondary | ICD-10-CM | POA: Diagnosis not present

## 2022-07-24 DIAGNOSIS — G4733 Obstructive sleep apnea (adult) (pediatric): Secondary | ICD-10-CM | POA: Diagnosis not present

## 2022-07-24 DIAGNOSIS — L609 Nail disorder, unspecified: Secondary | ICD-10-CM | POA: Diagnosis not present

## 2022-07-24 DIAGNOSIS — R159 Full incontinence of feces: Secondary | ICD-10-CM | POA: Insufficient documentation

## 2022-07-24 DIAGNOSIS — I4819 Other persistent atrial fibrillation: Secondary | ICD-10-CM

## 2022-07-24 DIAGNOSIS — I340 Nonrheumatic mitral (valve) insufficiency: Secondary | ICD-10-CM | POA: Diagnosis not present

## 2022-07-24 DIAGNOSIS — Z86711 Personal history of pulmonary embolism: Secondary | ICD-10-CM

## 2022-07-24 DIAGNOSIS — Z7901 Long term (current) use of anticoagulants: Secondary | ICD-10-CM | POA: Diagnosis not present

## 2022-07-24 DIAGNOSIS — I471 Supraventricular tachycardia, unspecified: Secondary | ICD-10-CM | POA: Diagnosis not present

## 2022-07-24 MED ORDER — APIXABAN 5 MG PO TABS: 5.0000 mg | ORAL_TABLET | Freq: Two times a day (BID) | ORAL | 0 refills | Status: DC

## 2022-07-24 NOTE — Patient Instructions (Signed)
Medication Instructions:  No Changes *If you need a refill on your cardiac medications before your next appointment, please call your pharmacy*   Lab Work: No Labs If you have labs (blood work) drawn today and your tests are completely normal, you will receive your results only by: [place] (if you have MyChart) OR A paper copy in the mail If you have any lab test that is abnormal or we need to change your treatment, we will call you to review the results.   Testing/Procedures: No Testing   Follow-Up: At [facility], you and your health needs are our priority.  As part of our continuing mission to provide you with exceptional heart care, we have created designated Provider Care Teams.  These Care Teams include your primary Cardiologist (physician) and Advanced Practice Providers (APPs -  Physician Assistants and Nurse Practitioners) who all work together to provide you with the care you need, when you need it.  We recommend signing up for the patient portal called "MyChart".  Sign up information is provided on this After Visit Summary.  MyChart is used to connect with patients for Virtual Visits (Telemedicine).  Patients are able to view lab/test results, encounter notes, upcoming appointments, etc.  Non-urgent messages can be sent to your provider as well.   To learn more about what you can do with MyChart, go to [email or web address].    Your next appointment:   Keep Scheduled Appointment  The format for your next appointment:   In Person  Provider:   Shelva Majestic, MD

## 2022-07-24 NOTE — Therapy (Incomplete)
OUTPATIENT PHYSICAL THERAPY TREATMENT   Patient Name: Janet Nguyen MRN: [id number] DOB:03/18/1932, 88 y.o., female Today's Date: 07/24/2022       Past Medical History:  Diagnosis Date   Arrhythmia    History of SVT with documented PVC'S and  PAC'S  12/08/12 Nuc stress test normal LV EF 74%  Event Monitor  12/01/12-01/03/13   Atrial flutter ([place])    Celiac disease    treated by Dr. 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 Marijean Bravo; pt states she was diagnosed 10-15 years ago, not treated at this time or any issues that she is aware of.   Scoliosis    Scoliosis    Sleep apnea 10/02/11 [facility]   Sleep study AHI -total sleep 10.3/hr  64.0/ hr during REM sleep.RDI 22.8/hr during total sleep 64.0/hr during REM sleep The lowest O2 sat during Non-REM and REM sleep was 86% and 88% respectively. 04/08/12 CPAP/BIPAP titration study [facility]   Past Surgical History:  Procedure Laterality Date   APPENDECTOMY     ruptured at age 14 and had surgery   CARDIAC CATHETERIZATION  01/27/06   CARDIOVERSION N/A 01/08/2022   Procedure: CARDIOVERSION;  Surgeon: Freada Bergeron, MD;  Location: [facility] ENDOSCOPY;  Service: Cardiovascular;  Laterality: N/A;   cataract surgery  2015   Dr. Herbert Deaner; March & April 2015   Patient Active Problem List   Diagnosis Date Noted   Secondary hypercoagulable state ([place]) 12/12/2021   Hyponatremia 12/04/2021   Acute CHF (congestive heart failure) ([place]) 12/02/2021   Acute on chronic diastolic CHF (congestive heart failure) ([place]) 12/02/2021   Atrial fibrillation with rapid ventricular response ([place]) 12/02/2021   Essential hypertension 12/02/2021   GERD without esophagitis 12/02/2021   Obstructive sleep apnea 12/02/2021    Interstitial lung disease ([place]) 12/02/2021   History of pulmonary embolism 12/02/2021   Pulmonary embolism ([place]) 11/06/2021   Bronchiectasis ([place]) 11/06/2021   Deviated septum 05/08/2020   Mass of subcutaneous tissue 05/02/2020   Vertigo 06/16/2019   Pulmonary hypertension, unspecified ([place]) 04/14/2019   Ischemic colitis ([place]) 04/14/2019   Migraine with aura and without status migrainosus, not intractable 11/08/2018   Degeneration of lumbar intervertebral disc 10/14/2018   Hoarseness of voice 03/04/2018   Abdominal pain 07/01/2017   Diverticulitis, colon    Metabolic acidosis, increased anion gap    Constipation 04/14/2017   Lumbar hernia 04/14/2017   Presbycusis of both ears 01/10/2017   Tinnitus aurium, bilateral 01/10/2017   Gastroesophageal reflux disease 08/20/2016   Hemoptysis 08/20/2016   Obstructive sleep apnea of adult 08/20/2016   Rhinitis, chronic 08/20/2016   Throat pain in adult 08/20/2016   Fatigue 12/23/2015   Sciatica of right side 10/06/2015   History of migraine headaches 10/06/2015   Frequent PVCs 12/28/2013   Premature atrial contractions 12/28/2013   PSVT (paroxysmal supraventricular tachycardia) ([place]) 12/28/2013   Heart palpitations 07/13/2013   Sleep apnea 04/11/2013   Scoliosis 04/11/2013   Atypical atrial flutter ([place]) 11/29/2012   Chest pain, atypical 11/29/2012   Fibromyalgia syndrome 11/29/2012   Chronic steroid use 11/29/2012      REFERRING PROVIDER: Lawerance Cruel, MD   REFERRING DIAG: M54.9 (ICD-10-CM) - Back pain   Rationale for Evaluation and Treatment  Rehabilitation  THERAPY DIAG:  No diagnosis found.  ONSET DATE: chronic LBP  SUBJECTIVE:                                                                                                                                                                                           SUBJECTIVE STATEMENT: I realize how behind I am from where I used to be when I was last in PT.   From  evaluation: I have had a bad year.  I had PE, was hospitalized, have a swollen Rt leg, am now on Lasix, I'm in afib (cardioversion didn't stick).  Blood clot in leg was ruled out.  My leg swelling is much better than it was but still very swollen.  I am easily winded even when I'm talking.   I have lost 5 inches with my scoliosis.  I am so compressed and painful on my Lt side I have no room for my stomach so eating is very challenging.  I struggle to move my bowels.   I can only lay on my Rt side or my back.  Laying on my Rt side so much can start hurting my hip.   My neck and shoulders always hurt.   My activity level has diminished signif due to getting so easily winded.  I won't be able to do the NuStep given how winded I get.  I am trying to consistently get back into my stretches from last PT sessions.   PERTINENT HISTORY:  Pt has cardiac doctor, rheumatologist Complex medical history: of note October 25, 2021 was positive for PE, hospitalized on March 6 through December 04, 2021 with increased palpitations and was found to be in atrial fibrillation, cardioversion April 2023 was initially successful but not long term  Long history of pain from signif scoliosis, has been in/out of PT for this History of migraine headaches Sleep apnea without tolerance of CPAP, uses mouthguard  PAIN:  PAIN:  Are you having pain? Yes NPRS scale: 5-6/10 Pain location: neck, back, shoulders, Rt hip Pain orientation: Right and Bilateral  PAIN TYPE: aching, throbbing, and tight Pain description: constant  Aggravating factors: activity, laying on Rt side Relieving factors: rest    PRECAUTIONS: None  WEIGHT BEARING RESTRICTIONS No  FALLS:  Has patient fallen in last 6 months? No  LIVING ENVIRONMENT: Lives with: lives alone Lives in: Other single level condo Stairs: No Has following equipment at home: None  OCCUPATION: retired  PLOF: Independent with household mobility without device and Independent  with community mobility without device  PATIENT GOALS open up Lt side of trunk, work back into my stretches,  less pain   OBJECTIVE:   DIAGNOSTIC FINDINGS:  No new imaging    SCREENING FOR RED FLAGS: Bowel or bladder incontinence: No Spinal tumors: No Cauda equina syndrome: No Compression fracture: No Abdominal aneurysm: No  COGNITION:  Overall cognitive status: Within functional limits for tasks assessed     SENSATION: WFL  MUSCLE LENGTH: End range bil hamstring limited Lt lat limits full Lt shoulder flexion  POSTURE:  severe scoliosis, concave on Lt with lower ribcage and pelvis approximation  PALPATION: Diffuse tenderness cervical, thoracic soft tissues, very tight Lt latissimus dorsi, bil pectorals, Lt obliques and QL, Rt hip Non-pitting edema in Rt leg and foot present - under care of MD and DVT ruled out per chart review, on Lasix  CERVICAL ROM:   Active  A/PROM  eval  Flexion 57  Extension 35  Right lateral flexion 12  Left lateral flexion 15  Right rotation 40  Left rotation 40   (Blank rows = not tested)  LUMBAR ROM:   Active  A/PROM  eval  Flexion Fingers to mid shin  Extension Not tested  Right lateral flexion 5  Left lateral flexion 3  Right rotation 20%  Left rotation 20%   (Blank rows = not tested)  LOWER EXTREMITY ROM:     Passive  Right eval Left eval  Hip flexion    Hip extension    Hip abduction    Hip adduction    Hip internal rotation 35 40  Hip external rotation 50 60  Knee flexion    Knee extension    Ankle dorsiflexion    Ankle plantarflexion    Ankle inversion    Ankle eversion     (Blank rows = not tested)  LOWER EXTREMITY MMT:    4/5 bil LE   GAIT: Distance walked: within clinic Assistive device utilized: None Level of assistance: Complete Independence Comments: gets very winded    TODAY'S TREATMENT  Date: 07/24/22   Date: 07/22/22 Rt SL: STM to Lt lateral trunk from pelvis to posterior shoulder  with fascial release, elongation, cued deep slow inhale/exhale to open ribcage Supine: Hooklying bil overhead reach with single heel slide with long leg reach, alt LE x 10 SKTC x 5 each, then DKTC rock side to side x 10 Lower trunk rotation x 3 rounds Hamstring stretch bil 1x30" Sitting: upper quadrant STM bil to neck and shoulders  Date: 07/16/22 Manual:  elongation to Lt side, thoracic and pelvic and associated soft tissue.  Seated STM bil upper traps, Lt lat, Lt obliques, bil pectoral manual stretching   Eval 07/03/22: Seated STM bil upper traps, Lt lat, Lt obliques, bil pectoral manual stretching   PATIENT EDUCATION:  Education details: discussed plan of care and goals Person educated: Patient Education method: Explanation Education comprehension: verbalized understanding   HOME EXERCISE PROGRAM: Start next time to get back into trunk and LE stretches  ASSESSMENT:  CLINICAL IMPRESSION: Pt arrived with less pain than last week.  She was less SOB within conversation.  Session focused on elongation of Lt trunk and release of upper quadrant tension.  Did get back into some supine ROM and stretches from past which were well tolerated.  Continue along POC.    OBJECTIVE IMPAIRMENTS decreased activity tolerance, decreased endurance, decreased mobility, decreased ROM, decreased strength, hypomobility, increased fascial restrictions, increased muscle spasms, impaired flexibility, impaired UE functional use, improper body mechanics, postural dysfunction, and pain.   ACTIVITY LIMITATIONS carrying, lifting, bending, standing, sleeping, transfers, and reach over  head  PARTICIPATION LIMITATIONS: cleaning, laundry, driving, shopping, community activity, and yard work  PERSONAL FACTORS Age and Time since onset of injury/illness/exacerbation are also affecting patient's functional outcome.   REHAB POTENTIAL: Good  CLINICAL DECISION MAKING: Stable/uncomplicated  EVALUATION COMPLEXITY:  Low   GOALS: Goals reviewed with patient? Yes  SHORT TERM GOALS: Target date: 07/31/22  Pt will be ind with initial HEP for trunk and LE mobility and stretching Baseline: Goal status: ongoing    LONG TERM GOALS: Target date: 08/28/22  Pt will be ind with strategies for symptom management including pacing, spinal decompression, positioning, and HEP. Baseline:  Goal status: INITIAL  2.  Pt will improve bil hamstring length to WNL to reduce back pain Baseline:  Goal status: INITIAL  3.  Pt will be able to perform light daily tasks within home for up to 30 min before needing a positional rest break. Baseline:  Goal status: INITIAL  4.  Pt will achieve improved soft tissue mobility of Lt side of trunk and bil cervical region to reduce pain and decompress abdominal organs Baseline:  Goal status: INITIAL  5.  Improve neck rotation to at least 50 deg for improved visibility with driving. Baseline: 40 deg bil Goal status: INITIAL     PLAN: PT FREQUENCY: 1-2x/week  PT DURATION: 8 weeks  PLANNED INTERVENTIONS: Therapeutic exercises, Therapeutic activity, Neuromuscular re-education, Balance training, Patient/Family education, Self Care, Joint mobilization, Aquatic Therapy, Dry Needling, Electrical stimulation, Spinal mobilization, Cryotherapy, Moist heat, and Manual therapy.  PLAN FOR NEXT SESSION: manual techniques to open up Lt side of trunk, begin light stretching as tol for hamstrings, QL, pectorals, Lt lat (wall slide in doorway), Pt with very limited cardiovascular endurance secondary to complex medical history so will likely not tol NuStep initially   [employer], PT 07/24/22 1:34 PM

## 2022-07-24 NOTE — Telephone Encounter (Signed)
Pt was a no show for PT appointment on 07/24/22 at 2:45pm.  PT left Pt a voicemail reminding her of her next appointment on 11/2.    Darnelle Derrick, PT 07/24/22 3:05 PM

## 2022-07-29 DIAGNOSIS — Z6822 Body mass index (BMI) 22.0-22.9, adult: Secondary | ICD-10-CM | POA: Diagnosis not present

## 2022-07-29 DIAGNOSIS — Z124 Encounter for screening for malignant neoplasm of cervix: Secondary | ICD-10-CM | POA: Diagnosis not present

## 2022-07-31 ENCOUNTER — Ambulatory Visit: Payer: Medicare Other | Attending: Family Medicine | Admitting: Physical Therapy

## 2022-07-31 ENCOUNTER — Encounter: Payer: Self-pay | Admitting: Physical Therapy

## 2022-07-31 DIAGNOSIS — M6281 Muscle weakness (generalized): Secondary | ICD-10-CM

## 2022-07-31 DIAGNOSIS — M546 Pain in thoracic spine: Secondary | ICD-10-CM | POA: Diagnosis not present

## 2022-07-31 DIAGNOSIS — M545 Low back pain, unspecified: Secondary | ICD-10-CM | POA: Insufficient documentation

## 2022-07-31 DIAGNOSIS — M4125 Other idiopathic scoliosis, thoracolumbar region: Secondary | ICD-10-CM

## 2022-07-31 DIAGNOSIS — R293 Abnormal posture: Secondary | ICD-10-CM | POA: Diagnosis not present

## 2022-07-31 DIAGNOSIS — M542 Cervicalgia: Secondary | ICD-10-CM | POA: Diagnosis not present

## 2022-07-31 DIAGNOSIS — G8929 Other chronic pain: Secondary | ICD-10-CM

## 2022-07-31 NOTE — Therapy (Signed)
OUTPATIENT PHYSICAL THERAPY TREATMENT   Patient Name: Janet Nguyen MRN: [id number] DOB:10-20-1931, 88 y.o., female Today's Date: 07/31/2022   PT End of Session - 07/31/22 1236     Visit Number 4    Date for PT Re-Evaluation 08/28/22    Authorization Type Medicare Part A/B, KX at visit 15    Progress Note Due on Visit 10    PT Start Time 1230    PT Stop Time 1313    PT Time Calculation (min) 43 min    Activity Tolerance Patient tolerated treatment well    Behavior During Therapy [facility] for tasks assessed/performed                Past Medical History:  Diagnosis Date   Arrhythmia    History of SVT with documented PVC'S and  PAC'S  12/08/12 Nuc stress test normal LV EF 74%  Event Monitor  12/01/12-01/03/13   Atrial flutter (HCC)    Celiac disease    treated by Dr. 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 Marijean Bravo; pt states she was diagnosed 10-15 years ago, not treated at this time or any issues that she is aware of.   Scoliosis    Scoliosis    Sleep apnea 10/02/11 [facility]   Sleep study AHI -total sleep 10.3/hr  64.0/ hr during REM sleep.RDI 22.8/hr during total sleep 64.0/hr during REM sleep The lowest O2 sat during Non-REM and REM sleep was 86% and 88% respectively. 04/08/12 CPAP/BIPAP titration study [facility]   Past Surgical History:  Procedure Laterality Date   APPENDECTOMY     ruptured at age 12 and had surgery   CARDIAC CATHETERIZATION  01/27/06   CARDIOVERSION N/A 01/08/2022   Procedure: CARDIOVERSION;  Surgeon: Freada Bergeron, MD;  Location: [facility] ENDOSCOPY;  Service: Cardiovascular;  Laterality: N/A;   cataract surgery  2015   Dr. Herbert Deaner; March & April 2015   Patient Active Problem List   Diagnosis Date Noted    Secondary hypercoagulable state ([place]) 12/12/2021   Hyponatremia 12/04/2021   Acute CHF (congestive heart failure) ([place]) 12/02/2021   Acute on chronic diastolic CHF (congestive heart failure) ([place]) 12/02/2021   Atrial fibrillation with rapid ventricular response ([place]) 12/02/2021   Essential hypertension 12/02/2021   GERD without esophagitis 12/02/2021   Obstructive sleep apnea 12/02/2021   Interstitial lung disease ([place]) 12/02/2021   History of pulmonary embolism 12/02/2021   Pulmonary embolism ([place]) 11/06/2021   Bronchiectasis ([place]) 11/06/2021   Deviated septum 05/08/2020   Mass of subcutaneous tissue 05/02/2020   Vertigo 06/16/2019   Pulmonary hypertension, unspecified ([place]) 04/14/2019   Ischemic colitis ([place]) 04/14/2019   Migraine with aura and without status migrainosus, not intractable 11/08/2018   Degeneration of lumbar intervertebral disc 10/14/2018   Hoarseness of voice 03/04/2018   Abdominal pain 07/01/2017   Diverticulitis, colon    Metabolic acidosis, increased anion gap    Constipation 04/14/2017   Lumbar hernia 04/14/2017   Presbycusis of both ears 01/10/2017   Tinnitus aurium, bilateral 01/10/2017   Gastroesophageal reflux disease 08/20/2016   Hemoptysis 08/20/2016   Obstructive sleep apnea of adult 08/20/2016   Rhinitis, chronic 08/20/2016   Throat pain in adult 08/20/2016   Fatigue 12/23/2015  Sciatica of right side 10/06/2015   History of migraine headaches 10/06/2015   Frequent PVCs 12/28/2013   Premature atrial contractions 12/28/2013   PSVT (paroxysmal supraventricular tachycardia) ([place]) 12/28/2013   Heart palpitations 07/13/2013   Sleep apnea 04/11/2013   Scoliosis 04/11/2013   Atypical atrial flutter ([place]) 11/29/2012   Chest pain, atypical 11/29/2012   Fibromyalgia syndrome 11/29/2012   Chronic steroid use 11/29/2012      REFERRING PROVIDER: Lawerance Cruel, MD   REFERRING DIAG: M54.9 (ICD-10-CM) - Back pain   Rationale for Evaluation and  Treatment Rehabilitation  THERAPY DIAG:  Other idiopathic scoliosis, thoracolumbar region  Muscle weakness (generalized)  Pain in thoracic spine  Cervicalgia  Chronic bilateral low back pain without sciatica  ONSET DATE: chronic LBP  SUBJECTIVE:                                                                                                                                                                                           SUBJECTIVE STATEMENT: My neck and low back are killing me.  Pain out of 10 is a 150.  From evaluation: I have had a bad year.  I had PE, was hospitalized, have a swollen Rt leg, am now on Lasix, I'm in afib (cardioversion didn't stick).  Blood clot in leg was ruled out.  My leg swelling is much better than it was but still very swollen.  I am easily winded even when I'm talking.   I have lost 5 inches with my scoliosis.  I am so compressed and painful on my Lt side I have no room for my stomach so eating is very challenging.  I struggle to move my bowels.   I can only lay on my Rt side or my back.  Laying on my Rt side so much can start hurting my hip.   My neck and shoulders always hurt.   My activity level has diminished signif due to getting so easily winded.  I won't be able to do the NuStep given how winded I get.  I am trying to consistently get back into my stretches from last PT sessions.   PERTINENT HISTORY:  Pt has cardiac doctor, rheumatologist Complex medical history: of note October 25, 2021 was positive for PE, hospitalized on March 6 through December 04, 2021 with increased palpitations and was found to be in atrial fibrillation, cardioversion April 2023 was initially successful but not long term  Long history of pain from signif scoliosis, has been in/out of PT for this History of migraine headaches Sleep apnea without tolerance of CPAP, uses mouthguard  PAIN:  PAIN:  Are you  having pain? Yes NPRS scale: 5-6/10 Pain location: neck, back,  shoulders, Rt hip Pain orientation: Right and Bilateral  PAIN TYPE: aching, throbbing, and tight Pain description: constant  Aggravating factors: activity, laying on Rt side Relieving factors: rest    PRECAUTIONS: None  WEIGHT BEARING RESTRICTIONS No  FALLS:  Has patient fallen in last 6 months? No  LIVING ENVIRONMENT: Lives with: lives alone Lives in: Other single level condo Stairs: No Has following equipment at home: None  OCCUPATION: retired  PLOF: Independent with household mobility without device and Independent with community mobility without device  PATIENT GOALS open up Lt side of trunk, work back into my stretches, less pain   OBJECTIVE:   DIAGNOSTIC FINDINGS:  No new imaging    SCREENING FOR RED FLAGS: Bowel or bladder incontinence: No Spinal tumors: No Cauda equina syndrome: No Compression fracture: No Abdominal aneurysm: No  COGNITION:  Overall cognitive status: Within functional limits for tasks assessed     SENSATION: WFL  MUSCLE LENGTH: End range bil hamstring limited Lt lat limits full Lt shoulder flexion  POSTURE:  severe scoliosis, concave on Lt with lower ribcage and pelvis approximation  PALPATION: Diffuse tenderness cervical, thoracic soft tissues, very tight Lt latissimus dorsi, bil pectorals, Lt obliques and QL, Rt hip Non-pitting edema in Rt leg and foot present - under care of MD and DVT ruled out per chart review, on Lasix  CERVICAL ROM:   Active  A/PROM  eval  Flexion 57  Extension 35  Right lateral flexion 12  Left lateral flexion 15  Right rotation 40  Left rotation 40   (Blank rows = not tested)  LUMBAR ROM:   Active  A/PROM  eval  Flexion Fingers to mid shin  Extension Not tested  Right lateral flexion 5  Left lateral flexion 3  Right rotation 20%  Left rotation 20%   (Blank rows = not tested)  LOWER EXTREMITY ROM:     Passive  Right eval Left eval  Hip flexion    Hip extension    Hip abduction     Hip adduction    Hip internal rotation 35 40  Hip external rotation 50 60  Knee flexion    Knee extension    Ankle dorsiflexion    Ankle plantarflexion    Ankle inversion    Ankle eversion     (Blank rows = not tested)  LOWER EXTREMITY MMT:    4/5 bil LE   GAIT: Distance walked: within clinic Assistive device utilized: None Level of assistance: Complete Independence Comments: gets very winded    TODAY'S TREATMENT  Date: 07/31/22 Rt SL: STM to Lt lateral trunk from pelvis to posterior shoulder with fascial release, elongation, cued deep slow inhale/exhale to open ribcage Supine: Hooklying bil overhead reach with single heel slide with long leg reach 2x10" bil SKTC x 5 each, then DKTC rock side to side x 10 Lower trunk rotation x 3 rounds Fig 4 stretch 3x10" bil Hamstring stretch bil 1x30" Sitting: upper quadrant STM bil to neck and shoulders Doorway stretch for pecs and QL on Lt 2x20" each   Date: 07/22/22 Rt SL: STM to Lt lateral trunk from pelvis to posterior shoulder with fascial release, elongation, cued deep slow inhale/exhale to open ribcage Supine: Hooklying bil overhead reach with single heel slide with long leg reach, alt LE x 10 SKTC x 5 each, then DKTC rock side to side x 10 Lower trunk rotation x 3 rounds Hamstring stretch bil 1x30"  Sitting: upper quadrant STM bil to neck and shoulders  Date: 07/16/22 Manual:  elongation to Lt side, thoracic and pelvic and associated soft tissue.  Seated STM bil upper traps, Lt lat, Lt obliques, bil pectoral manual stretching   Eval 07/03/22: Seated STM bil upper traps, Lt lat, Lt obliques, bil pectoral manual stretching   PATIENT EDUCATION:  Education details: discussed plan of care and goals Person educated: Patient Education method: Explanation Education comprehension: verbalized understanding   HOME EXERCISE PROGRAM: Start next time to get back into trunk and LE stretches  ASSESSMENT:  CLINICAL  IMPRESSION: Pt has been walking holding onto grocery carts when running errands vs getting out in neighborhood.  She arrived with report of "150/10" pain in neck, upper back and lower back.  She benefits from manual therapy for soft tissue elongation to open up short side of scoliosis.  She has been increasing her addition of stretches each session.  She was very SOB on arrival but this improved within the session.   OBJECTIVE IMPAIRMENTS decreased activity tolerance, decreased endurance, decreased mobility, decreased ROM, decreased strength, hypomobility, increased fascial restrictions, increased muscle spasms, impaired flexibility, impaired UE functional use, improper body mechanics, postural dysfunction, and pain.   ACTIVITY LIMITATIONS carrying, lifting, bending, standing, sleeping, transfers, and reach over head  PARTICIPATION LIMITATIONS: cleaning, laundry, driving, shopping, community activity, and yard work  PERSONAL FACTORS Age and Time since onset of injury/illness/exacerbation are also affecting patient's functional outcome.   REHAB POTENTIAL: Good  CLINICAL DECISION MAKING: Stable/uncomplicated  EVALUATION COMPLEXITY: Low   GOALS: Goals reviewed with patient? Yes  SHORT TERM GOALS: Target date: 07/31/22  Pt will be ind with initial HEP for trunk and LE mobility and stretching Baseline: Goal status: ongoing    LONG TERM GOALS: Target date: 08/28/22  Pt will be ind with strategies for symptom management including pacing, spinal decompression, positioning, and HEP. Baseline:  Goal status: INITIAL  2.  Pt will improve bil hamstring length to WNL to reduce back pain Baseline:  Goal status: INITIAL  3.  Pt will be able to perform light daily tasks within home for up to 30 min before needing a positional rest break. Baseline:  Goal status: INITIAL  4.  Pt will achieve improved soft tissue mobility of Lt side of trunk and bil cervical region to reduce pain and  decompress abdominal organs Baseline:  Goal status: INITIAL  5.  Improve neck rotation to at least 50 deg for improved visibility with driving. Baseline: 40 deg bil Goal status: INITIAL     PLAN: PT FREQUENCY: 1-2x/week  PT DURATION: 8 weeks  PLANNED INTERVENTIONS: Therapeutic exercises, Therapeutic activity, Neuromuscular re-education, Balance training, Patient/Family education, Self Care, Joint mobilization, Aquatic Therapy, Dry Needling, Electrical stimulation, Spinal mobilization, Cryotherapy, Moist heat, and Manual therapy.  PLAN FOR NEXT SESSION: manual techniques to open up Lt side of trunk, begin light stretching as tol for hamstrings, QL, pectorals, Lt lat (wall slide in doorway), Pt with very limited cardiovascular endurance secondary to complex medical history so will likely not tol NuStep initially  [employer], PT 07/31/22 1:14 PM

## 2022-08-05 ENCOUNTER — Encounter: Payer: Self-pay | Admitting: Physical Therapy

## 2022-08-05 ENCOUNTER — Ambulatory Visit: Payer: Medicare Other | Admitting: Physical Therapy

## 2022-08-05 DIAGNOSIS — M546 Pain in thoracic spine: Secondary | ICD-10-CM | POA: Diagnosis not present

## 2022-08-05 DIAGNOSIS — M6281 Muscle weakness (generalized): Secondary | ICD-10-CM

## 2022-08-05 DIAGNOSIS — G8929 Other chronic pain: Secondary | ICD-10-CM | POA: Diagnosis not present

## 2022-08-05 DIAGNOSIS — M542 Cervicalgia: Secondary | ICD-10-CM

## 2022-08-05 DIAGNOSIS — R293 Abnormal posture: Secondary | ICD-10-CM

## 2022-08-05 DIAGNOSIS — M4125 Other idiopathic scoliosis, thoracolumbar region: Secondary | ICD-10-CM | POA: Diagnosis not present

## 2022-08-05 DIAGNOSIS — M545 Low back pain, unspecified: Secondary | ICD-10-CM | POA: Diagnosis not present

## 2022-08-05 NOTE — Therapy (Signed)
OUTPATIENT PHYSICAL THERAPY TREATMENT   Patient Name: Janet Nguyen MRN: [id number] DOB:Aug 13, 1932, 88 y.o., female Today's Date: 08/05/2022   PT End of Session - 08/05/22 1231     Visit Number 5    Date for PT Re-Evaluation 08/28/22    Authorization Type Medicare Part A/B, KX at visit 15    Progress Note Due on Visit 10    PT Start Time 1232    PT Stop Time 1314    PT Time Calculation (min) 42 min    Activity Tolerance Patient tolerated treatment well    Behavior During Therapy [facility] for tasks assessed/performed                 Past Medical History:  Diagnosis Date   Arrhythmia    History of SVT with documented PVC'S and  PAC'S  12/08/12 Nuc stress test normal LV EF 74%  Event Monitor  12/01/12-01/03/13   Atrial flutter (HCC)    Celiac disease    treated by Dr. 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 Marijean Bravo; pt states she was diagnosed 10-15 years ago, not treated at this time or any issues that she is aware of.   Scoliosis    Scoliosis    Sleep apnea 10/02/11 [facility]   Sleep study AHI -total sleep 10.3/hr  64.0/ hr during REM sleep.RDI 22.8/hr during total sleep 64.0/hr during REM sleep The lowest O2 sat during Non-REM and REM sleep was 86% and 88% respectively. 04/08/12 CPAP/BIPAP titration study [facility]   Past Surgical History:  Procedure Laterality Date   APPENDECTOMY     ruptured at age 52 and had surgery   CARDIAC CATHETERIZATION  01/27/06   CARDIOVERSION N/A 01/08/2022   Procedure: CARDIOVERSION;  Surgeon: Freada Bergeron, MD;  Location: [facility] ENDOSCOPY;  Service: Cardiovascular;  Laterality: N/A;   cataract surgery  2015   Dr. Herbert Deaner; March & April 2015   Patient Active Problem List   Diagnosis Date Noted    Secondary hypercoagulable state ([place]) 12/12/2021   Hyponatremia 12/04/2021   Acute CHF (congestive heart failure) ([place]) 12/02/2021   Acute on chronic diastolic CHF (congestive heart failure) ([place]) 12/02/2021   Atrial fibrillation with rapid ventricular response ([place]) 12/02/2021   Essential hypertension 12/02/2021   GERD without esophagitis 12/02/2021   Obstructive sleep apnea 12/02/2021   Interstitial lung disease ([place]) 12/02/2021   History of pulmonary embolism 12/02/2021   Pulmonary embolism ([place]) 11/06/2021   Bronchiectasis ([place]) 11/06/2021   Deviated septum 05/08/2020   Mass of subcutaneous tissue 05/02/2020   Vertigo 06/16/2019   Pulmonary hypertension, unspecified ([place]) 04/14/2019   Ischemic colitis ([place]) 04/14/2019   Migraine with aura and without status migrainosus, not intractable 11/08/2018   Degeneration of lumbar intervertebral disc 10/14/2018   Hoarseness of voice 03/04/2018   Abdominal pain 07/01/2017   Diverticulitis, colon    Metabolic acidosis, increased anion gap    Constipation 04/14/2017   Lumbar hernia 04/14/2017   Presbycusis of both ears 01/10/2017   Tinnitus aurium, bilateral 01/10/2017   Gastroesophageal reflux disease 08/20/2016   Hemoptysis 08/20/2016   Obstructive sleep apnea of adult 08/20/2016   Rhinitis, chronic 08/20/2016   Throat pain in adult 08/20/2016   Fatigue 12/23/2015  Sciatica of right side 10/06/2015   History of migraine headaches 10/06/2015   Frequent PVCs 12/28/2013   Premature atrial contractions 12/28/2013   PSVT (paroxysmal supraventricular tachycardia) ([place]) 12/28/2013   Heart palpitations 07/13/2013   Sleep apnea 04/11/2013   Scoliosis 04/11/2013   Atypical atrial flutter ([place]) 11/29/2012   Chest pain, atypical 11/29/2012   Fibromyalgia syndrome 11/29/2012   Chronic steroid use 11/29/2012      REFERRING PROVIDER: Lawerance Cruel, MD   REFERRING DIAG: M54.9 (ICD-10-CM) - Back pain   Rationale for Evaluation  and Treatment Rehabilitation  THERAPY DIAG:  Other idiopathic scoliosis, thoracolumbar region  Pain in thoracic spine  Muscle weakness (generalized)  Cervicalgia  Chronic bilateral low back pain without sciatica  Abnormal posture  ONSET DATE: chronic LBP  SUBJECTIVE:                                                                                                                                                                                           SUBJECTIVE STATEMENT: I woke up with new excrutiating pain in my left hip and pelvis.  I wonder if I didn't move last night b/c I did sleep so well.    From evaluation: I have had a bad year.  I had PE, was hospitalized, have a swollen Rt leg, am now on Lasix, I'm in afib (cardioversion didn't stick).  Blood clot in leg was ruled out.  My leg swelling is much better than it was but still very swollen.  I am easily winded even when I'm talking.   I have lost 5 inches with my scoliosis.  I am so compressed and painful on my Lt side I have no room for my stomach so eating is very challenging.  I struggle to move my bowels.   I can only lay on my Rt side or my back.  Laying on my Rt side so much can start hurting my hip.   My neck and shoulders always hurt.   My activity level has diminished signif due to getting so easily winded.  I won't be able to do the NuStep given how winded I get.  I am trying to consistently get back into my stretches from last PT sessions.   PERTINENT HISTORY:  Pt has cardiac doctor, rheumatologist Complex medical history: of note October 25, 2021 was positive for PE, hospitalized on March 6 through December 04, 2021 with increased palpitations and was found to be in atrial fibrillation, cardioversion April 2023 was initially successful but not long term  Long history of pain from signif scoliosis, has been in/out of PT for this History of  migraine headaches Sleep apnea without tolerance of CPAP, uses mouthguard  PAIN:   PAIN:  Are you having pain? Yes NPRS scale: 5-6/10 Pain location: neck, back, shoulders, Rt hip Pain orientation: Right and Bilateral  PAIN TYPE: aching, throbbing, and tight Pain description: constant  Aggravating factors: activity, laying on Rt side Relieving factors: rest    PRECAUTIONS: None  WEIGHT BEARING RESTRICTIONS No  FALLS:  Has patient fallen in last 6 months? No  LIVING ENVIRONMENT: Lives with: lives alone Lives in: Other single level condo Stairs: No Has following equipment at home: None  OCCUPATION: retired  PLOF: Independent with household mobility without device and Independent with community mobility without device  PATIENT GOALS open up Lt side of trunk, work back into my stretches, less pain   OBJECTIVE:   DIAGNOSTIC FINDINGS:  No new imaging    SCREENING FOR RED FLAGS: Bowel or bladder incontinence: No Spinal tumors: No Cauda equina syndrome: No Compression fracture: No Abdominal aneurysm: No  COGNITION:  Overall cognitive status: Within functional limits for tasks assessed     SENSATION: WFL  MUSCLE LENGTH: End range bil hamstring limited Lt lat limits full Lt shoulder flexion  POSTURE:  severe scoliosis, concave on Lt with lower ribcage and pelvis approximation  PALPATION: Diffuse tenderness cervical, thoracic soft tissues, very tight Lt latissimus dorsi, bil pectorals, Lt obliques and QL, Rt hip Non-pitting edema in Rt leg and foot present - under care of MD and DVT ruled out per chart review, on Lasix  CERVICAL ROM:   Active  A/PROM  eval  Flexion 57  Extension 35  Right lateral flexion 12  Left lateral flexion 15  Right rotation 40  Left rotation 40   (Blank rows = not tested)  LUMBAR ROM:   Active  A/PROM  eval  Flexion Fingers to mid shin  Extension Not tested  Right lateral flexion 5  Left lateral flexion 3  Right rotation 20%  Left rotation 20%   (Blank rows = not tested)  LOWER EXTREMITY ROM:      Passive  Right eval Left eval  Hip flexion    Hip extension    Hip abduction    Hip adduction    Hip internal rotation 35 40  Hip external rotation 50 60  Knee flexion    Knee extension    Ankle dorsiflexion    Ankle plantarflexion    Ankle inversion    Ankle eversion     (Blank rows = not tested)  LOWER EXTREMITY MMT:    4/5 bil LE   GAIT: Distance walked: within clinic Assistive device utilized: None Level of assistance: Complete Independence Comments: gets very winded    TODAY'S TREATMENT  Date: 08/05/22: Rt SL: STM to Lt lateral trunk from pelvis to posterior shoulder with fascial release, active and passive elongation, trigger point release Lt glut med Supine fig 4 and piriformis stretch bil 2x20" Supine bridge 1x10, supine bil clam blue loop 1x10 (gave red tied loop for HEP) P/ROM by PT Lt hip flexion and ER, light traction through Lt LE  Sitting: upper quadrant STM bil to neck and shoulders    Date: 07/31/22 Rt SL: STM to Lt lateral trunk from pelvis to posterior shoulder with fascial release, elongation, cued deep slow inhale/exhale to open ribcage Supine: Hooklying bil overhead reach with single heel slide with long leg reach 2x10" bil SKTC x 5 each, then DKTC rock side to side x 10 Lower trunk rotation x 3 rounds Fig  4 stretch 3x10" bil Hamstring stretch bil 1x30" Sitting: upper quadrant STM bil to neck and shoulders Doorway stretch for pecs and QL on Lt 2x20" each   Date: 07/22/22 Rt SL: STM to Lt lateral trunk from pelvis to posterior shoulder with fascial release, elongation, cued deep slow inhale/exhale to open ribcage Supine: Hooklying bil overhead reach with single heel slide with long leg reach, alt LE x 10 SKTC x 5 each, then DKTC rock side to side x 10 Lower trunk rotation x 3 rounds Hamstring stretch bil 1x30" Sitting: upper quadrant STM bil to neck and shoulders  PATIENT EDUCATION:  Education details: discussed plan of care and  goals Person educated: Patient Education method: Explanation Education comprehension: verbalized understanding   HOME EXERCISE PROGRAM: Access Code: [email or web address] (past HEP) - have initiated aspects of stretching  ASSESSMENT:  CLINICAL IMPRESSION: Pt arrives with Lt antalgic gait with pain on weight bearing.  This is a new pain she woke up with.  She appears to have trigger points in Lt glut med.  Manual techniques, stretching and light activation via bridge and clam seemed to give relief by end of session with improved WB through Lt LE with gait.    OBJECTIVE IMPAIRMENTS decreased activity tolerance, decreased endurance, decreased mobility, decreased ROM, decreased strength, hypomobility, increased fascial restrictions, increased muscle spasms, impaired flexibility, impaired UE functional use, improper body mechanics, postural dysfunction, and pain.   ACTIVITY LIMITATIONS carrying, lifting, bending, standing, sleeping, transfers, and reach over head  PARTICIPATION LIMITATIONS: cleaning, laundry, driving, shopping, community activity, and yard work  PERSONAL FACTORS Age and Time since onset of injury/illness/exacerbation are also affecting patient's functional outcome.   REHAB POTENTIAL: Good  CLINICAL DECISION MAKING: Stable/uncomplicated  EVALUATION COMPLEXITY: Low   GOALS: Goals reviewed with patient? Yes  SHORT TERM GOALS: Target date: 07/31/22  Pt will be ind with initial HEP for trunk and LE mobility and stretching Baseline: Goal status: ongoing    LONG TERM GOALS: Target date: 08/28/22  Pt will be ind with strategies for symptom management including pacing, spinal decompression, positioning, and HEP. Baseline:  Goal status: INITIAL  2.  Pt will improve bil hamstring length to WNL to reduce back pain Baseline:  Goal status: INITIAL  3.  Pt will be able to perform light daily tasks within home for up to 30 min before needing a positional rest break. Baseline:  Goal  status: INITIAL  4.  Pt will achieve improved soft tissue mobility of Lt side of trunk and bil cervical region to reduce pain and decompress abdominal organs Baseline:  Goal status: INITIAL  5.  Improve neck rotation to at least 50 deg for improved visibility with driving. Baseline: 40 deg bil Goal status: INITIAL     PLAN: PT FREQUENCY: 1-2x/week  PT DURATION: 8 weeks  PLANNED INTERVENTIONS: Therapeutic exercises, Therapeutic activity, Neuromuscular re-education, Balance training, Patient/Family education, Self Care, Joint mobilization, Aquatic Therapy, Dry Needling, Electrical stimulation, Spinal mobilization, Cryotherapy, Moist heat, and Manual therapy.  PLAN FOR NEXT SESSION: manual techniques to open up Lt side of trunk, begin light stretching as tol for hamstrings, QL, pectorals, Lt lat (wall slide in doorway), Pt with very limited cardiovascular endurance secondary to complex medical history so will likely not tol NuStep initially  [employer], PT 08/05/22 1:25 PM

## 2022-08-07 ENCOUNTER — Ambulatory Visit: Payer: Medicare Other | Admitting: Physical Therapy

## 2022-08-07 ENCOUNTER — Encounter: Payer: Self-pay | Admitting: Physical Therapy

## 2022-08-07 DIAGNOSIS — M6281 Muscle weakness (generalized): Secondary | ICD-10-CM

## 2022-08-07 DIAGNOSIS — M542 Cervicalgia: Secondary | ICD-10-CM

## 2022-08-07 DIAGNOSIS — G8929 Other chronic pain: Secondary | ICD-10-CM

## 2022-08-07 DIAGNOSIS — M546 Pain in thoracic spine: Secondary | ICD-10-CM

## 2022-08-07 DIAGNOSIS — M4125 Other idiopathic scoliosis, thoracolumbar region: Secondary | ICD-10-CM

## 2022-08-07 DIAGNOSIS — M545 Low back pain, unspecified: Secondary | ICD-10-CM | POA: Diagnosis not present

## 2022-08-07 DIAGNOSIS — R293 Abnormal posture: Secondary | ICD-10-CM

## 2022-08-07 NOTE — Therapy (Signed)
OUTPATIENT PHYSICAL THERAPY TREATMENT   Patient Name: Janet Nguyen MRN: [id number] DOB:Oct 08, 1931, 88 y.o., female Today's Date: 08/07/2022   PT End of Session - 08/07/22 1236     Visit Number 6    Date for PT Re-Evaluation 08/28/22    Authorization Type Medicare Part A/B, KX at visit 15    Progress Note Due on Visit 10    PT Start Time 1233    PT Stop Time 1315    PT Time Calculation (min) 42 min    Activity Tolerance Patient tolerated treatment well    Behavior During Therapy [facility] for tasks assessed/performed                  Past Medical History:  Diagnosis Date   Arrhythmia    History of SVT with documented PVC'S and  PAC'S  12/08/12 Nuc stress test normal LV EF 74%  Event Monitor  12/01/12-01/03/13   Atrial flutter ([place])    Celiac disease    treated by Dr. 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 Marijean Bravo; pt states she was diagnosed 10-15 years ago, not treated at this time or any issues that she is aware of.   Scoliosis    Scoliosis    Sleep apnea 10/02/11 [facility]   Sleep study AHI -total sleep 10.3/hr  64.0/ hr during REM sleep.RDI 22.8/hr during total sleep 64.0/hr during REM sleep The lowest O2 sat during Non-REM and REM sleep was 86% and 88% respectively. 04/08/12 CPAP/BIPAP titration study [facility]   Past Surgical History:  Procedure Laterality Date   APPENDECTOMY     ruptured at age 89 and had surgery   CARDIAC CATHETERIZATION  01/27/06   CARDIOVERSION N/A 01/08/2022   Procedure: CARDIOVERSION;  Surgeon: Freada Bergeron, MD;  Location: [facility] ENDOSCOPY;  Service: Cardiovascular;  Laterality: N/A;   cataract surgery  2015   Dr. Herbert Deaner; March & April 2015   Patient Active Problem List   Diagnosis Date  Noted   Secondary hypercoagulable state ([place]) 12/12/2021   Hyponatremia 12/04/2021   Acute CHF (congestive heart failure) ([place]) 12/02/2021   Acute on chronic diastolic CHF (congestive heart failure) ([place]) 12/02/2021   Atrial fibrillation with rapid ventricular response ([place]) 12/02/2021   Essential hypertension 12/02/2021   GERD without esophagitis 12/02/2021   Obstructive sleep apnea 12/02/2021   Interstitial lung disease ([place]) 12/02/2021   History of pulmonary embolism 12/02/2021   Pulmonary embolism ([place]) 11/06/2021   Bronchiectasis ([place]) 11/06/2021   Deviated septum 05/08/2020   Mass of subcutaneous tissue 05/02/2020   Vertigo 06/16/2019   Pulmonary hypertension, unspecified ([place]) 04/14/2019   Ischemic colitis ([place]) 04/14/2019   Migraine with aura and without status migrainosus, not intractable 11/08/2018   Degeneration of lumbar intervertebral disc 10/14/2018   Hoarseness of voice 03/04/2018   Abdominal pain 07/01/2017   Diverticulitis, colon    Metabolic acidosis, increased anion gap    Constipation 04/14/2017   Lumbar hernia 04/14/2017   Presbycusis of both ears 01/10/2017   Tinnitus aurium, bilateral 01/10/2017   Gastroesophageal reflux disease 08/20/2016   Hemoptysis 08/20/2016   Obstructive sleep apnea of adult 08/20/2016   Rhinitis, chronic 08/20/2016   Throat pain in adult 08/20/2016   Fatigue 12/23/2015  Sciatica of right side 10/06/2015   History of migraine headaches 10/06/2015   Frequent PVCs 12/28/2013   Premature atrial contractions 12/28/2013   PSVT (paroxysmal supraventricular tachycardia) ([place]) 12/28/2013   Heart palpitations 07/13/2013   Sleep apnea 04/11/2013   Scoliosis 04/11/2013   Atypical atrial flutter ([place]) 11/29/2012   Chest pain, atypical 11/29/2012   Fibromyalgia syndrome 11/29/2012   Chronic steroid use 11/29/2012      REFERRING PROVIDER: Lawerance Cruel, MD   REFERRING DIAG: M54.9 (ICD-10-CM) - Back pain   Rationale for  Evaluation and Treatment Rehabilitation  THERAPY DIAG:  Other idiopathic scoliosis, thoracolumbar region  Pain in thoracic spine  Muscle weakness (generalized)  Cervicalgia  Chronic bilateral low back pain without sciatica  Abnormal posture  ONSET DATE: chronic LBP  SUBJECTIVE:                                                                                                                                                                                           SUBJECTIVE STATEMENT: My Lt hip and pelvis are much better.  I am even having improved ease with my bowels since starting PT.  Today my Rt mid back is hurting a lot.  From evaluation: I have had a bad year.  I had PE, was hospitalized, have a swollen Rt leg, am now on Lasix, I'm in afib (cardioversion didn't stick).  Blood clot in leg was ruled out.  My leg swelling is much better than it was but still very swollen.  I am easily winded even when I'm talking.   I have lost 5 inches with my scoliosis.  I am so compressed and painful on my Lt side I have no room for my stomach so eating is very challenging.  I struggle to move my bowels.   I can only lay on my Rt side or my back.  Laying on my Rt side so much can start hurting my hip.   My neck and shoulders always hurt.   My activity level has diminished signif due to getting so easily winded.  I won't be able to do the NuStep given how winded I get.  I am trying to consistently get back into my stretches from last PT sessions.   PERTINENT HISTORY:  Pt has cardiac doctor, rheumatologist Complex medical history: of note October 25, 2021 was positive for PE, hospitalized on March 6 through December 04, 2021 with increased palpitations and was found to be in atrial fibrillation, cardioversion April 2023 was initially successful but not long term  Long history of pain from signif scoliosis, has been in/out of PT for this History  of migraine headaches Sleep apnea without tolerance of  CPAP, uses mouthguard  PAIN:  PAIN:  Are you having pain? Yes NPRS scale: 5/10 Pain location: neck, back, shoulders, Rt hip Pain orientation: Right and Bilateral  PAIN TYPE: aching, throbbing, and tight Pain description: constant  Aggravating factors: activity, laying on Rt side Relieving factors: rest    PRECAUTIONS: None  WEIGHT BEARING RESTRICTIONS No  FALLS:  Has patient fallen in last 6 months? No  LIVING ENVIRONMENT: Lives with: lives alone Lives in: Other single level condo Stairs: No Has following equipment at home: None  OCCUPATION: retired  PLOF: Independent with household mobility without device and Independent with community mobility without device  PATIENT GOALS open up Lt side of trunk, work back into my stretches, less pain   OBJECTIVE:   DIAGNOSTIC FINDINGS:  No new imaging    SCREENING FOR RED FLAGS: Bowel or bladder incontinence: No Spinal tumors: No Cauda equina syndrome: No Compression fracture: No Abdominal aneurysm: No  COGNITION:  Overall cognitive status: Within functional limits for tasks assessed     SENSATION: WFL  MUSCLE LENGTH: End range bil hamstring limited Lt lat limits full Lt shoulder flexion  POSTURE:  severe scoliosis, concave on Lt with lower ribcage and pelvis approximation  PALPATION: Diffuse tenderness cervical, thoracic soft tissues, very tight Lt latissimus dorsi, bil pectorals, Lt obliques and QL, Rt hip Non-pitting edema in Rt leg and foot present - under care of MD and DVT ruled out per chart review, on Lasix  CERVICAL ROM:   Active  A/PROM  eval  Flexion 57  Extension 35  Right lateral flexion 12  Left lateral flexion 15  Right rotation 40  Left rotation 40   (Blank rows = not tested)  LUMBAR ROM:   Active  A/PROM  eval  Flexion Fingers to mid shin  Extension Not tested  Right lateral flexion 5  Left lateral flexion 3  Right rotation 20%  Left rotation 20%   (Blank rows = not  tested)  LOWER EXTREMITY ROM:     Passive  Right eval Left eval  Hip flexion    Hip extension    Hip abduction    Hip adduction    Hip internal rotation 35 40  Hip external rotation 50 60  Knee flexion    Knee extension    Ankle dorsiflexion    Ankle plantarflexion    Ankle inversion    Ankle eversion     (Blank rows = not tested)  LOWER EXTREMITY MMT:    4/5 bil LE   GAIT: Distance walked: within clinic Assistive device utilized: None Level of assistance: Complete Independence Comments: gets very winded    TODAY'S TREATMENT  Date: 08/07/22 Sitting: upper quadrant STM bil to neck and shoulders  Yellow tband standing x 15 each bil: row, shoulder ext, horiz abd, ER Rt SL: STM to Lt lateral trunk from pelvis to posterior shoulder with fascial release, active and passive elongation, trigger point release Lt glut med  Date: 08/05/22: Rt SL: STM to Lt lateral trunk from pelvis to posterior shoulder with fascial release, active and passive elongation, trigger point release Lt glut med Supine fig 4 and piriformis stretch bil 2x20" Supine bridge 1x10, supine bil clam blue loop 1x10 (gave red tied loop for HEP) P/ROM by PT Lt hip flexion and ER, light traction through Lt LE  Sitting: upper quadrant STM bil to neck and shoulders    Date: 07/31/22 Rt SL: STM to Lt  lateral trunk from pelvis to posterior shoulder with fascial release, elongation, cued deep slow inhale/exhale to open ribcage Supine: Hooklying bil overhead reach with single heel slide with long leg reach 2x10" bil SKTC x 5 each, then DKTC rock side to side x 10 Lower trunk rotation x 3 rounds Fig 4 stretch 3x10" bil Hamstring stretch bil 1x30" Sitting: upper quadrant STM bil to neck and shoulders Doorway stretch for pecs and QL on Lt 2x20" each  PATIENT EDUCATION:  Education details: discussed plan of care and goals Person educated: Patient Education method: Explanation Education comprehension: verbalized  understanding   HOME EXERCISE PROGRAM: Access Code: [email or web address] URL: [email or web address] Date: 08/07/2022 Prepared by: [place] Night Naiara Lombardozzi  Exercises - Standing Row with Anchored Resistance  - 1 x daily - 7 x weekly - 2 sets - 10 reps - Standing Shoulder Extension with Resistance  - 1 x daily - 7 x weekly - 2 sets - 10 reps - Standing Shoulder External Rotation with Resistance  - 1 x daily - 7 x weekly - 2 sets - 10 reps - Standing Shoulder Horizontal Abduction with Resistance  - 1 x daily - 7 x weekly - 2 sets - 10 reps  ASSESSMENT:  CLINICAL IMPRESSION: Improved Lt hip pain since last session but report of thoracic pain Rt>Lt today.  Manual techniques performed today and initiated yellow tband standing scapular and postural strength with good tolerance.  Continue along POC.   OBJECTIVE IMPAIRMENTS decreased activity tolerance, decreased endurance, decreased mobility, decreased ROM, decreased strength, hypomobility, increased fascial restrictions, increased muscle spasms, impaired flexibility, impaired UE functional use, improper body mechanics, postural dysfunction, and pain.   ACTIVITY LIMITATIONS carrying, lifting, bending, standing, sleeping, transfers, and reach over head  PARTICIPATION LIMITATIONS: cleaning, laundry, driving, shopping, community activity, and yard work  PERSONAL FACTORS Age and Time since onset of injury/illness/exacerbation are also affecting patient's functional outcome.   REHAB POTENTIAL: Good  CLINICAL DECISION MAKING: Stable/uncomplicated  EVALUATION COMPLEXITY: Low   GOALS: Goals reviewed with patient? Yes  SHORT TERM GOALS: Target date: 07/31/22  Pt will be ind with initial HEP for trunk and LE mobility and stretching Baseline: Goal status: ongoing    LONG TERM GOALS: Target date: 08/28/22  Pt will be ind with strategies for symptom management including pacing, spinal decompression, positioning, and HEP. Baseline:  Goal  status: INITIAL  2.  Pt will improve bil hamstring length to WNL to reduce back pain Baseline:  Goal status: INITIAL  3.  Pt will be able to perform light daily tasks within home for up to 30 min before needing a positional rest break. Baseline:  Goal status: INITIAL  4.  Pt will achieve improved soft tissue mobility of Lt side of trunk and bil cervical region to reduce pain and decompress abdominal organs Baseline:  Goal status: INITIAL  5.  Improve neck rotation to at least 50 deg for improved visibility with driving. Baseline: 40 deg bil Goal status: INITIAL     PLAN: PT FREQUENCY: 1-2x/week  PT DURATION: 8 weeks  PLANNED INTERVENTIONS: Therapeutic exercises, Therapeutic activity, Neuromuscular re-education, Balance training, Patient/Family education, Self Care, Joint mobilization, Aquatic Therapy, Dry Needling, Electrical stimulation, Spinal mobilization, Cryotherapy, Moist heat, and Manual therapy.  PLAN FOR NEXT SESSION: manual techniques to open up Lt side of trunk, begin light stretching as tol for hamstrings, QL, pectorals, Lt lat (wall slide in doorway), Pt with very limited cardiovascular endurance secondary to complex medical history so will likely not tol NuStep initially  [place]  Makeila Yamaguchi, PT 08/07/22 1:16 PM

## 2022-08-08 DIAGNOSIS — F320 Major depressive disorder, single episode, mild: Secondary | ICD-10-CM | POA: Diagnosis not present

## 2022-08-08 DIAGNOSIS — I4891 Unspecified atrial fibrillation: Secondary | ICD-10-CM | POA: Diagnosis not present

## 2022-08-08 DIAGNOSIS — I10 Essential (primary) hypertension: Secondary | ICD-10-CM | POA: Diagnosis not present

## 2022-08-08 DIAGNOSIS — I509 Heart failure, unspecified: Secondary | ICD-10-CM | POA: Diagnosis not present

## 2022-08-12 ENCOUNTER — Encounter: Payer: Self-pay | Admitting: Physical Therapy

## 2022-08-12 ENCOUNTER — Ambulatory Visit: Payer: Medicare Other | Admitting: Physical Therapy

## 2022-08-12 DIAGNOSIS — M4125 Other idiopathic scoliosis, thoracolumbar region: Secondary | ICD-10-CM | POA: Diagnosis not present

## 2022-08-12 DIAGNOSIS — R293 Abnormal posture: Secondary | ICD-10-CM

## 2022-08-12 DIAGNOSIS — M542 Cervicalgia: Secondary | ICD-10-CM

## 2022-08-12 DIAGNOSIS — M545 Low back pain, unspecified: Secondary | ICD-10-CM | POA: Diagnosis not present

## 2022-08-12 DIAGNOSIS — M546 Pain in thoracic spine: Secondary | ICD-10-CM | POA: Diagnosis not present

## 2022-08-12 DIAGNOSIS — M6281 Muscle weakness (generalized): Secondary | ICD-10-CM | POA: Diagnosis not present

## 2022-08-12 DIAGNOSIS — G8929 Other chronic pain: Secondary | ICD-10-CM | POA: Diagnosis not present

## 2022-08-12 NOTE — Therapy (Signed)
OUTPATIENT PHYSICAL THERAPY TREATMENT   Patient Name: Janet Nguyen MRN: [id number] DOB:[birth year], 88 y.o., female Today's Date: 08/12/2022   PT End of Session - 08/12/22 1230     Visit Number 7    Date for PT Re-Evaluation 08/28/22    Authorization Type Medicare Part A/B, KX at visit 15    Progress Note Due on Visit 10    PT Start Time 1230    PT Stop Time 1315    PT Time Calculation (min) 45 min    Activity Tolerance Patient tolerated treatment well    Behavior During Therapy [facility] for tasks assessed/performed                   Past Medical History:  Diagnosis Date   Arrhythmia    History of SVT with documented PVC'S and  PAC'S  12/08/12 Nuc stress test normal LV EF 74%  Event Monitor  12/01/12-01/03/13   Atrial flutter ([place])    Celiac disease    treated by Dr. 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 Marijean Bravo; pt states she was diagnosed 10-15 years ago, not treated at this time or any issues that she is aware of.   Scoliosis    Scoliosis    Sleep apnea 10/02/11 [facility]   Sleep study AHI -total sleep 10.3/hr  64.0/ hr during REM sleep.RDI 22.8/hr during total sleep 64.0/hr during REM sleep The lowest O2 sat during Non-REM and REM sleep was 86% and 88% respectively. 04/08/12 CPAP/BIPAP titration study [facility]   Past Surgical History:  Procedure Laterality Date   APPENDECTOMY     ruptured at age 30 and had surgery   CARDIAC CATHETERIZATION  01/27/06   CARDIOVERSION N/A 01/08/2022   Procedure: CARDIOVERSION;  Surgeon: Freada Bergeron, MD;  Location: [facility] ENDOSCOPY;  Service: Cardiovascular;  Laterality: N/A;   cataract surgery  2015   Dr. Herbert Deaner; March & April 2015   Patient Active Problem List   Diagnosis Date  Noted   Secondary hypercoagulable state ([place]) 12/12/2021   Hyponatremia 12/04/2021   Acute CHF (congestive heart failure) ([place]) 12/02/2021   Acute on chronic diastolic CHF (congestive heart failure) ([place]) 12/02/2021   Atrial fibrillation with rapid ventricular response ([place]) 12/02/2021   Essential hypertension 12/02/2021   GERD without esophagitis 12/02/2021   Obstructive sleep apnea 12/02/2021   Interstitial lung disease ([place]) 12/02/2021   History of pulmonary embolism 12/02/2021   Pulmonary embolism ([place]) 11/06/2021   Bronchiectasis ([place]) 11/06/2021   Deviated septum 05/08/2020   Mass of subcutaneous tissue 05/02/2020   Vertigo 06/16/2019   Pulmonary hypertension, unspecified ([place]) 04/14/2019   Ischemic colitis ([place]) 04/14/2019   Migraine with aura and without status migrainosus, not intractable 11/08/2018   Degeneration of lumbar intervertebral disc 10/14/2018   Hoarseness of voice 03/04/2018   Abdominal pain 07/01/2017   Diverticulitis, colon    Metabolic acidosis, increased anion gap    Constipation 04/14/2017   Lumbar hernia 04/14/2017   Presbycusis of both ears 01/10/2017   Tinnitus aurium, bilateral 01/10/2017   Gastroesophageal reflux disease 08/20/2016   Hemoptysis 08/20/2016   Obstructive sleep apnea of adult 08/20/2016   Rhinitis, chronic 08/20/2016   Throat pain in adult 08/20/2016   Fatigue  12/23/2015   Sciatica of right side 10/06/2015   History of migraine headaches 10/06/2015   Frequent PVCs 12/28/2013   Premature atrial contractions 12/28/2013   PSVT (paroxysmal supraventricular tachycardia) ([place]) 12/28/2013   Heart palpitations 07/13/2013   Sleep apnea 04/11/2013   Scoliosis 04/11/2013   Atypical atrial flutter ([place]) 11/29/2012   Chest pain, atypical 11/29/2012   Fibromyalgia syndrome 11/29/2012   Chronic steroid use 11/29/2012      REFERRING PROVIDER: Lawerance Cruel, MD   REFERRING DIAG: M54.9 (ICD-10-CM) - Back pain   Rationale for  Evaluation and Treatment Rehabilitation  THERAPY DIAG:  Other idiopathic scoliosis, thoracolumbar region  Pain in thoracic spine  Muscle weakness (generalized)  Cervicalgia  Chronic bilateral low back pain without sciatica  Abnormal posture  ONSET DATE: chronic LBP  SUBJECTIVE:                                                                                                                                                                                           SUBJECTIVE STATEMENT: I haven't slept in 3 days.  Whatever you did last time really helped my Rt mid back.   I tried to do the HEP but have a question.  From evaluation: I have had a bad year.  I had PE, was hospitalized, have a swollen Rt leg, am now on Lasix, I'm in afib (cardioversion didn't stick).  Blood clot in leg was ruled out.  My leg swelling is much better than it was but still very swollen.  I am easily winded even when I'm talking.   I have lost 5 inches with my scoliosis.  I am so compressed and painful on my Lt side I have no room for my stomach so eating is very challenging.  I struggle to move my bowels.   I can only lay on my Rt side or my back.  Laying on my Rt side so much can start hurting my hip.   My neck and shoulders always hurt.   My activity level has diminished signif due to getting so easily winded.  I won't be able to do the NuStep given how winded I get.  I am trying to consistently get back into my stretches from last PT sessions.   PERTINENT HISTORY:  Pt has cardiac doctor, rheumatologist Complex medical history: of note October 25, 2021 was positive for PE, hospitalized on March 6 through December 04, 2021 with increased palpitations and was found to be in atrial fibrillation, cardioversion April 2023 was initially successful but not long term  Long history of pain from signif scoliosis, has been in/out of PT for  this History of migraine headaches Sleep apnea without tolerance of CPAP, uses  mouthguard  PAIN:  PAIN:  Are you having pain? Yes NPRS scale: 5/10 Pain location: neck, back, shoulders, Rt hip Pain orientation: Right and Bilateral  PAIN TYPE: aching, throbbing, and tight Pain description: constant  Aggravating factors: activity, laying on Rt side Relieving factors: rest    PRECAUTIONS: None  WEIGHT BEARING RESTRICTIONS No  FALLS:  Has patient fallen in last 6 months? No  LIVING ENVIRONMENT: Lives with: lives alone Lives in: Other single level condo Stairs: No Has following equipment at home: None  OCCUPATION: retired  PLOF: Independent with household mobility without device and Independent with community mobility without device  PATIENT GOALS open up Lt side of trunk, work back into my stretches, less pain   OBJECTIVE:   DIAGNOSTIC FINDINGS:  No new imaging    SCREENING FOR RED FLAGS: Bowel or bladder incontinence: No Spinal tumors: No Cauda equina syndrome: No Compression fracture: No Abdominal aneurysm: No  COGNITION:  Overall cognitive status: Within functional limits for tasks assessed     SENSATION: WFL  MUSCLE LENGTH: End range bil hamstring limited Lt lat limits full Lt shoulder flexion  POSTURE:  severe scoliosis, concave on Lt with lower ribcage and pelvis approximation  PALPATION: Diffuse tenderness cervical, thoracic soft tissues, very tight Lt latissimus dorsi, bil pectorals, Lt obliques and QL, Rt hip Non-pitting edema in Rt leg and foot present - under care of MD and DVT ruled out per chart review, on Lasix  CERVICAL ROM:   Active  A/PROM  eval  Flexion 57  Extension 35  Right lateral flexion 12  Left lateral flexion 15  Right rotation 40  Left rotation 40   (Blank rows = not tested)  LUMBAR ROM:   Active  A/PROM  eval  Flexion Fingers to mid shin  Extension Not tested  Right lateral flexion 5  Left lateral flexion 3  Right rotation 20%  Left rotation 20%   (Blank rows = not tested)  LOWER  EXTREMITY ROM:     Passive  Right eval Left eval  Hip flexion    Hip extension    Hip abduction    Hip adduction    Hip internal rotation 35 40  Hip external rotation 50 60  Knee flexion    Knee extension    Ankle dorsiflexion    Ankle plantarflexion    Ankle inversion    Ankle eversion     (Blank rows = not tested)  LOWER EXTREMITY MMT:    4/5 bil LE   GAIT: Distance walked: within clinic Assistive device utilized: None Level of assistance: Complete Independence Comments: gets very winded    TODAY'S TREATMENT  Date: 08/12/22: NuStep L1 x 3' PT present to monitor (.08 miles) Sitting: upper quadrant STM bil to neck and shoulders Rt SL: STM to Lt lateral trunk from pelvis to posterior shoulder with fascial release, elongation, cued deep slow inhale/exhale to open ribcage Verbal review and PT demo of yellow tband series from HEP  Date: 08/07/22 Sitting: upper quadrant STM bil to neck and shoulders  Yellow tband standing x 15 each bil: row, shoulder ext, horiz abd, ER Rt SL: STM to Lt lateral trunk from pelvis to posterior shoulder with fascial release, active and passive elongation, trigger point release Lt glut med  Date: 08/05/22: Rt SL: STM to Lt lateral trunk from pelvis to posterior shoulder with fascial release, active and passive elongation, trigger point release Lt  glut med Supine fig 4 and piriformis stretch bil 2x20" Supine bridge 1x10, supine bil clam blue loop 1x10 (gave red tied loop for HEP) P/ROM by PT Lt hip flexion and ER, light traction through Lt LE  Sitting: upper quadrant STM bil to neck and shoulders  PATIENT EDUCATION:  Education details: discussed plan of care and goals Person educated: Patient Education method: Explanation Education comprehension: verbalized understanding   HOME EXERCISE PROGRAM: Access Code: [email or web address] URL: [email or web address] Date: 08/07/2022 Prepared by: Venetia Night Yaron Grasse  Exercises - Standing Row  with Anchored Resistance  - 1 x daily - 7 x weekly - 2 sets - 10 reps - Standing Shoulder Extension with Resistance  - 1 x daily - 7 x weekly - 2 sets - 10 reps - Standing Shoulder External Rotation with Resistance  - 1 x daily - 7 x weekly - 2 sets - 10 reps - Standing Shoulder Horizontal Abduction with Resistance  - 1 x daily - 7 x weekly - 2 sets - 10 reps  ASSESSMENT:  CLINICAL IMPRESSION: Pt reported improved Rt midback pain from last session.  Pt was able to do active warm up on NuStep today x 3' without SOB.  Manual techniques performed for pain and spinal/soft tissue elongation.  HEP reviewed as Pt had questions about unanchored therex with band.  Improved tissue release carry over along Lt side of trunk noted today, meeting STG.   OBJECTIVE IMPAIRMENTS decreased activity tolerance, decreased endurance, decreased mobility, decreased ROM, decreased strength, hypomobility, increased fascial restrictions, increased muscle spasms, impaired flexibility, impaired UE functional use, improper body mechanics, postural dysfunction, and pain.   ACTIVITY LIMITATIONS carrying, lifting, bending, standing, sleeping, transfers, and reach over head  PARTICIPATION LIMITATIONS: cleaning, laundry, driving, shopping, community activity, and yard work  PERSONAL FACTORS Age and Time since onset of injury/illness/exacerbation are also affecting patient's functional outcome.   REHAB POTENTIAL: Good  CLINICAL DECISION MAKING: Stable/uncomplicated  EVALUATION COMPLEXITY: Low   GOALS: Goals reviewed with patient? Yes  SHORT TERM GOALS: Target date: 07/31/22  Pt will be ind with initial HEP for trunk and LE mobility and stretching Baseline: Goal status: ongoing    LONG TERM GOALS: Target date: 08/28/22  Pt will be ind with strategies for symptom management including pacing, spinal decompression, positioning, and HEP. Baseline:  Goal status: met  2.  Pt will improve bil hamstring length to WNL to  reduce back pain Baseline:  Goal status: INITIAL  3.  Pt will be able to perform light daily tasks within home for up to 30 min before needing a positional rest break. Baseline:  Goal status: INITIAL  4.  Pt will achieve improved soft tissue mobility of Lt side of trunk and bil cervical region to reduce pain and decompress abdominal organs Baseline:  Goal status: met  5.  Improve neck rotation to at least 50 deg for improved visibility with driving. Baseline: 40 deg bil Goal status: INITIAL     PLAN: PT FREQUENCY: 1-2x/week  PT DURATION: 8 weeks  PLANNED INTERVENTIONS: Therapeutic exercises, Therapeutic activity, Neuromuscular re-education, Balance training, Patient/Family education, Self Care, Joint mobilization, Aquatic Therapy, Dry Needling, Electrical stimulation, Spinal mobilization, Cryotherapy, Moist heat, and Manual therapy.  PLAN FOR NEXT SESSION: NuStep, tband postural strength, measure neck rotation for STM, manual techniques to open up Lt side of trunk, begin light stretching as tol for hamstrings, QL, pectorals, Lt lat (wall slide in doorway),   Udell Blasingame, PT 08/12/22 1:27 PM

## 2022-08-14 ENCOUNTER — Encounter: Payer: Self-pay | Admitting: Physical Therapy

## 2022-08-14 ENCOUNTER — Ambulatory Visit: Payer: Medicare Other | Admitting: Physical Therapy

## 2022-08-14 DIAGNOSIS — M546 Pain in thoracic spine: Secondary | ICD-10-CM | POA: Diagnosis not present

## 2022-08-14 DIAGNOSIS — M542 Cervicalgia: Secondary | ICD-10-CM

## 2022-08-14 DIAGNOSIS — M4125 Other idiopathic scoliosis, thoracolumbar region: Secondary | ICD-10-CM | POA: Diagnosis not present

## 2022-08-14 DIAGNOSIS — G8929 Other chronic pain: Secondary | ICD-10-CM

## 2022-08-14 DIAGNOSIS — M6281 Muscle weakness (generalized): Secondary | ICD-10-CM

## 2022-08-14 DIAGNOSIS — M545 Low back pain, unspecified: Secondary | ICD-10-CM | POA: Diagnosis not present

## 2022-08-14 NOTE — Therapy (Signed)
OUTPATIENT PHYSICAL THERAPY TREATMENT   Patient Name: Janet Nguyen MRN: [id number] DOB:September 19, 1932, 88 y.o., female Today's Date: 08/14/2022   PT End of Session - 08/14/22 1227     Visit Number 8    Date for PT Re-Evaluation 08/28/22    Authorization Type Medicare Part A/B, KX at visit 15    Progress Note Due on Visit 10    PT Start Time 1230    PT Stop Time 1315    PT Time Calculation (min) 45 min    Activity Tolerance Patient tolerated treatment well    Behavior During Therapy [facility] for tasks assessed/performed                    Past Medical History:  Diagnosis Date   Arrhythmia    History of SVT with documented PVC'S and  PAC'S  12/08/12 Nuc stress test normal LV EF 74%  Event Monitor  12/01/12-01/03/13   Atrial flutter ([place])    Celiac disease    treated by Dr. 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 Marijean Bravo; pt states she was diagnosed 10-15 years ago, not treated at this time or any issues that she is aware of.   Scoliosis    Scoliosis    Sleep apnea 10/02/11 [facility]   Sleep study AHI -total sleep 10.3/hr  64.0/ hr during REM sleep.RDI 22.8/hr during total sleep 64.0/hr during REM sleep The lowest O2 sat during Non-REM and REM sleep was 86% and 88% respectively. 04/08/12 CPAP/BIPAP titration study [facility]   Past Surgical History:  Procedure Laterality Date   APPENDECTOMY     ruptured at age 54 and had surgery   CARDIAC CATHETERIZATION  01/27/06   CARDIOVERSION N/A 01/08/2022   Procedure: CARDIOVERSION;  Surgeon: Freada Bergeron, MD;  Location: [facility] ENDOSCOPY;  Service: Cardiovascular;  Laterality: N/A;   cataract surgery  2015   Dr. Herbert Deaner; March & April 2015   Patient Active Problem List   Diagnosis Date  Noted   Secondary hypercoagulable state ([place]) 12/12/2021   Hyponatremia 12/04/2021   Acute CHF (congestive heart failure) ([place]) 12/02/2021   Acute on chronic diastolic CHF (congestive heart failure) ([place]) 12/02/2021   Atrial fibrillation with rapid ventricular response ([place]) 12/02/2021   Essential hypertension 12/02/2021   GERD without esophagitis 12/02/2021   Obstructive sleep apnea 12/02/2021   Interstitial lung disease ([place]) 12/02/2021   History of pulmonary embolism 12/02/2021   Pulmonary embolism ([place]) 11/06/2021   Bronchiectasis ([place]) 11/06/2021   Deviated septum 05/08/2020   Mass of subcutaneous tissue 05/02/2020   Vertigo 06/16/2019   Pulmonary hypertension, unspecified ([place]) 04/14/2019   Ischemic colitis ([place]) 04/14/2019   Migraine with aura and without status migrainosus, not intractable 11/08/2018   Degeneration of lumbar intervertebral disc 10/14/2018   Hoarseness of voice 03/04/2018   Abdominal pain 07/01/2017   Diverticulitis, colon    Metabolic acidosis, increased anion gap    Constipation 04/14/2017   Lumbar hernia 04/14/2017   Presbycusis of both ears 01/10/2017   Tinnitus aurium, bilateral 01/10/2017   Gastroesophageal reflux disease 08/20/2016   Hemoptysis 08/20/2016   Obstructive sleep apnea of adult 08/20/2016   Rhinitis, chronic 08/20/2016   Throat pain in adult 08/20/2016  Fatigue 12/23/2015   Sciatica of right side 10/06/2015   History of migraine headaches 10/06/2015   Frequent PVCs 12/28/2013   Premature atrial contractions 12/28/2013   PSVT (paroxysmal supraventricular tachycardia) ([place]) 12/28/2013   Heart palpitations 07/13/2013   Sleep apnea 04/11/2013   Scoliosis 04/11/2013   Atypical atrial flutter ([place]) 11/29/2012   Chest pain, atypical 11/29/2012   Fibromyalgia syndrome 11/29/2012   Chronic steroid use 11/29/2012      REFERRING PROVIDER: Lawerance Cruel, MD   REFERRING DIAG: M54.9 (ICD-10-CM) - Back pain   Rationale for  Evaluation and Treatment Rehabilitation  THERAPY DIAG:  Other idiopathic scoliosis, thoracolumbar region  Pain in thoracic spine  Muscle weakness (generalized)  Cervicalgia  Chronic bilateral low back pain without sciatica  ONSET DATE: chronic LBP  SUBJECTIVE:                                                                                                                                                                                           SUBJECTIVE STATEMENT: I am about the same.  From evaluation: I have had a bad year.  I had PE, was hospitalized, have a swollen Rt leg, am now on Lasix, I'm in afib (cardioversion didn't stick).  Blood clot in leg was ruled out.  My leg swelling is much better than it was but still very swollen.  I am easily winded even when I'm talking.   I have lost 5 inches with my scoliosis.  I am so compressed and painful on my Lt side I have no room for my stomach so eating is very challenging.  I struggle to move my bowels.   I can only lay on my Rt side or my back.  Laying on my Rt side so much can start hurting my hip.   My neck and shoulders always hurt.   My activity level has diminished signif due to getting so easily winded.  I won't be able to do the NuStep given how winded I get.  I am trying to consistently get back into my stretches from last PT sessions.   PERTINENT HISTORY:  Pt has cardiac doctor, rheumatologist Complex medical history: of note October 25, 2021 was positive for PE, hospitalized on March 6 through December 04, 2021 with increased palpitations and was found to be in atrial fibrillation, cardioversion April 2023 was initially successful but not long term  Long history of pain from signif scoliosis, has been in/out of PT for this History of migraine headaches Sleep apnea without tolerance of CPAP, uses mouthguard  PAIN:  PAIN:  Are you having pain? Yes NPRS scale: 5/10 Pain  location: neck, back, shoulders, Rt hip Pain  orientation: Right and Bilateral  PAIN TYPE: aching, throbbing, and tight Pain description: constant  Aggravating factors: activity, laying on Rt side Relieving factors: rest    PRECAUTIONS: None  WEIGHT BEARING RESTRICTIONS No  FALLS:  Has patient fallen in last 6 months? No  LIVING ENVIRONMENT: Lives with: lives alone Lives in: Other single level condo Stairs: No Has following equipment at home: None  OCCUPATION: retired  PLOF: Independent with household mobility without device and Independent with community mobility without device  PATIENT GOALS open up Lt side of trunk, work back into my stretches, less pain   OBJECTIVE:   DIAGNOSTIC FINDINGS:  No new imaging    SCREENING FOR RED FLAGS: Bowel or bladder incontinence: No Spinal tumors: No Cauda equina syndrome: No Compression fracture: No Abdominal aneurysm: No  COGNITION:  Overall cognitive status: Within functional limits for tasks assessed     SENSATION: WFL  MUSCLE LENGTH: End range bil hamstring limited Lt lat limits full Lt shoulder flexion  POSTURE:  severe scoliosis, concave on Lt with lower ribcage and pelvis approximation  PALPATION: Diffuse tenderness cervical, thoracic soft tissues, very tight Lt latissimus dorsi, bil pectorals, Lt obliques and QL, Rt hip Non-pitting edema in Rt leg and foot present - under care of MD and DVT ruled out per chart review, on Lasix  CERVICAL ROM:   Active  A/PROM  eval  Flexion 57  Extension 35  Right lateral flexion 12  Left lateral flexion 15  Right rotation 40  Left rotation 40   (Blank rows = not tested)  LUMBAR ROM:   Active  A/PROM  eval  Flexion Fingers to mid shin  Extension Not tested  Right lateral flexion 5  Left lateral flexion 3  Right rotation 20%  Left rotation 20%   (Blank rows = not tested)  LOWER EXTREMITY ROM:     Passive  Right eval Left eval  Hip flexion    Hip extension    Hip abduction    Hip adduction    Hip  internal rotation 35 40  Hip external rotation 50 60  Knee flexion    Knee extension    Ankle dorsiflexion    Ankle plantarflexion    Ankle inversion    Ankle eversion     (Blank rows = not tested)  LOWER EXTREMITY MMT:    4/5 bil LE   GAIT: Distance walked: within clinic Assistive device utilized: None Level of assistance: Complete Independence Comments: gets very winded    TODAY'S TREATMENT  Date: 08/14/22: NuStep L2 x 5' PT present to monitor Yellow tband bil UE x 10 each: horiz abd, ER, row, extension    Sitting: upper quadrant STM bil to neck and shoulders    Rt SL: STM to Lt lateral trunk from pelvis to posterior shoulder with fascial release, elongation, cued deep slow inhale/exhale to open ribcage  Date: 08/12/22: NuStep L1 x 3' PT present to monitor (.08 miles) Sitting: upper quadrant STM bil to neck and shoulders Rt SL: STM to Lt lateral trunk from pelvis to posterior shoulder with fascial release, elongation, cued deep slow inhale/exhale to open ribcage Verbal review and PT demo of yellow tband series from HEP  Date: 08/07/22 Sitting: upper quadrant STM bil to neck and shoulders  Yellow tband standing x 15 each bil: row, shoulder ext, horiz abd, ER Rt SL: STM to Lt lateral trunk from pelvis to posterior shoulder with fascial release, active and  passive elongation, trigger point release Lt glut med   PATIENT EDUCATION:  Education details: discussed plan of care and goals Person educated: Patient Education method: Explanation Education comprehension: verbalized understanding   HOME EXERCISE PROGRAM: Access Code: [email or web address] URL: [email or web address] Date: 08/07/2022 Prepared by: Venetia Night Uzoma Vivona  Exercises - Standing Row with Anchored Resistance  - 1 x daily - 7 x weekly - 2 sets - 10 reps - Standing Shoulder Extension with Resistance  - 1 x daily - 7 x weekly - 2 sets - 10 reps - Standing Shoulder External Rotation with Resistance  - 1 x  daily - 7 x weekly - 2 sets - 10 reps - Standing Shoulder Horizontal Abduction with Resistance  - 1 x daily - 7 x weekly - 2 sets - 10 reps  ASSESSMENT:  CLINICAL IMPRESSION: Pt's tissues demonstrate improving carry over between appointments with improved mobility of fascia and extensibility of muscles. Pt continues to have chronic pain related to severe scoliosis.  She was able to add time on NuStep today reaching 5'.  She demos good form with tband therex from HEP which she is compliant with.  Pt continues to benefit from skilled manual techniques to reduce pain, decompress spine, and improve mobility.   OBJECTIVE IMPAIRMENTS decreased activity tolerance, decreased endurance, decreased mobility, decreased ROM, decreased strength, hypomobility, increased fascial restrictions, increased muscle spasms, impaired flexibility, impaired UE functional use, improper body mechanics, postural dysfunction, and pain.   ACTIVITY LIMITATIONS carrying, lifting, bending, standing, sleeping, transfers, and reach over head  PARTICIPATION LIMITATIONS: cleaning, laundry, driving, shopping, community activity, and yard work  PERSONAL FACTORS Age and Time since onset of injury/illness/exacerbation are also affecting patient's functional outcome.   REHAB POTENTIAL: Good  CLINICAL DECISION MAKING: Stable/uncomplicated  EVALUATION COMPLEXITY: Low   GOALS: Goals reviewed with patient? Yes  SHORT TERM GOALS: Target date: 07/31/22  Pt will be ind with initial HEP for trunk and LE mobility and stretching Baseline: Goal status: ongoing    LONG TERM GOALS: Target date: 08/28/22  Pt will be ind with strategies for symptom management including pacing, spinal decompression, positioning, and HEP. Baseline:  Goal status: met  2.  Pt will improve bil hamstring length to WNL to reduce back pain Baseline:  Goal status: INITIAL  3.  Pt will be able to perform light daily tasks within home for up to 30 min  before needing a positional rest break. Baseline:  Goal status: INITIAL  4.  Pt will achieve improved soft tissue mobility of Lt side of trunk and bil cervical region to reduce pain and decompress abdominal organs Baseline:  Goal status: met  5.  Improve neck rotation to at least 50 deg for improved visibility with driving. Baseline: 40 deg bil Goal status: INITIAL     PLAN: PT FREQUENCY: 1-2x/week  PT DURATION: 8 weeks  PLANNED INTERVENTIONS: Therapeutic exercises, Therapeutic activity, Neuromuscular re-education, Balance training, Patient/Family education, Self Care, Joint mobilization, Aquatic Therapy, Dry Needling, Electrical stimulation, Spinal mobilization, Cryotherapy, Moist heat, and Manual therapy.  PLAN FOR NEXT SESSION: NuStep, tband postural strength, measure neck rotation for STM, manual techniques to open up Lt side of trunk, begin light stretching as tol for hamstrings, QL, pectorals, Lt lat (wall slide in doorway),   Tykia Mellone, PT 08/14/22 1:20 PM

## 2022-08-19 ENCOUNTER — Ambulatory Visit: Payer: Medicare Other | Admitting: Physical Therapy

## 2022-08-19 NOTE — Therapy (Incomplete)
OUTPATIENT PHYSICAL THERAPY TREATMENT   Patient Name: Janet Nguyen MRN: [id number] DOB:Mar 16, 1932, 88 y.o., female Today's Date: 08/19/2022            Past Medical History:  Diagnosis Date   Arrhythmia    History of SVT with documented PVC'S and  PAC'S  12/08/12 Nuc stress test normal LV EF 74%  Event Monitor  12/01/12-01/03/13   Atrial flutter ([place])    Celiac disease    treated by Dr. 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 Marijean Bravo; pt states she was diagnosed 10-15 years ago, not treated at this time or any issues that she is aware of.   Scoliosis    Scoliosis    Sleep apnea 10/02/11 [facility]   Sleep study AHI -total sleep 10.3/hr  64.0/ hr during REM sleep.RDI 22.8/hr during total sleep 64.0/hr during REM sleep The lowest O2 sat during Non-REM and REM sleep was 86% and 88% respectively. 04/08/12 CPAP/BIPAP titration study [facility]   Past Surgical History:  Procedure Laterality Date   APPENDECTOMY     ruptured at age 50 and had surgery   CARDIAC CATHETERIZATION  01/27/06   CARDIOVERSION N/A 01/08/2022   Procedure: CARDIOVERSION;  Surgeon: Freada Bergeron, MD;  Location: [facility] ENDOSCOPY;  Service: Cardiovascular;  Laterality: N/A;   cataract surgery  2015   Dr. Herbert Deaner; March & April 2015   Patient Active Problem List   Diagnosis Date Noted   Secondary hypercoagulable state ([place]) 12/12/2021   Hyponatremia 12/04/2021   Acute CHF (congestive heart failure) ([place]) 12/02/2021   Acute on chronic diastolic CHF (congestive heart failure) ([place]) 12/02/2021   Atrial fibrillation with rapid ventricular response ([place]) 12/02/2021   Essential hypertension 12/02/2021   GERD without esophagitis 12/02/2021   Obstructive sleep apnea  12/02/2021   Interstitial lung disease ([place]) 12/02/2021   History of pulmonary embolism 12/02/2021   Pulmonary embolism ([place]) 11/06/2021   Bronchiectasis ([place]) 11/06/2021   Deviated septum 05/08/2020   Mass of subcutaneous tissue 05/02/2020   Vertigo 06/16/2019   Pulmonary hypertension, unspecified ([place]) 04/14/2019   Ischemic colitis ([place]) 04/14/2019   Migraine with aura and without status migrainosus, not intractable 11/08/2018   Degeneration of lumbar intervertebral disc 10/14/2018   Hoarseness of voice 03/04/2018   Abdominal pain 07/01/2017   Diverticulitis, colon    Metabolic acidosis, increased anion gap    Constipation 04/14/2017   Lumbar hernia 04/14/2017   Presbycusis of both ears 01/10/2017   Tinnitus aurium, bilateral 01/10/2017   Gastroesophageal reflux disease 08/20/2016   Hemoptysis 08/20/2016   Obstructive sleep apnea of adult 08/20/2016   Rhinitis, chronic 08/20/2016   Throat pain in adult 08/20/2016   Fatigue 12/23/2015   Sciatica of right side 10/06/2015   History of migraine headaches 10/06/2015   Frequent PVCs 12/28/2013   Premature atrial contractions 12/28/2013   PSVT (paroxysmal supraventricular tachycardia) ([place]) 12/28/2013   Heart palpitations 07/13/2013   Sleep apnea 04/11/2013   Scoliosis 04/11/2013   Atypical atrial flutter ([place]) 11/29/2012   Chest pain, atypical 11/29/2012   Fibromyalgia syndrome 11/29/2012   Chronic steroid use 11/29/2012      REFERRING PROVIDER: Lawerance Cruel, MD   REFERRING DIAG: M54.9 (ICD-10-CM) - Back pain  Rationale for Evaluation and Treatment Rehabilitation  THERAPY DIAG:  No diagnosis found.  ONSET DATE: chronic LBP  SUBJECTIVE:                                                                                                                                                                                           SUBJECTIVE STATEMENT: I am about the same.  From evaluation: I have had a bad year.  I  had PE, was hospitalized, have a swollen Rt leg, am now on Lasix, I'm in afib (cardioversion didn't stick).  Blood clot in leg was ruled out.  My leg swelling is much better than it was but still very swollen.  I am easily winded even when I'm talking.   I have lost 5 inches with my scoliosis.  I am so compressed and painful on my Lt side I have no room for my stomach so eating is very challenging.  I struggle to move my bowels.   I can only lay on my Rt side or my back.  Laying on my Rt side so much can start hurting my hip.   My neck and shoulders always hurt.   My activity level has diminished signif due to getting so easily winded.  I won't be able to do the NuStep given how winded I get.  I am trying to consistently get back into my stretches from last PT sessions.   PERTINENT HISTORY:  Pt has cardiac doctor, rheumatologist Complex medical history: of note October 25, 2021 was positive for PE, hospitalized on March 6 through December 04, 2021 with increased palpitations and was found to be in atrial fibrillation, cardioversion April 2023 was initially successful but not long term  Long history of pain from signif scoliosis, has been in/out of PT for this History of migraine headaches Sleep apnea without tolerance of CPAP, uses mouthguard  PAIN:  PAIN:  Are you having pain? Yes NPRS scale: 5/10 Pain location: neck, back, shoulders, Rt hip Pain orientation: Right and Bilateral  PAIN TYPE: aching, throbbing, and tight Pain description: constant  Aggravating factors: activity, laying on Rt side Relieving factors: rest    PRECAUTIONS: None  WEIGHT BEARING RESTRICTIONS No  FALLS:  Has patient fallen in last 6 months? No  LIVING ENVIRONMENT: Lives with: lives alone Lives in: Other single level condo Stairs: No Has following equipment at home: None  OCCUPATION: retired  PLOF: Independent with household mobility without device and Independent with community mobility without  device  PATIENT GOALS open up Lt side of trunk, work back into my stretches, less pain   OBJECTIVE:   DIAGNOSTIC FINDINGS:  No new imaging    SCREENING FOR RED FLAGS: Bowel or bladder incontinence: No Spinal tumors: No Cauda equina syndrome: No Compression fracture: No Abdominal aneurysm: No  COGNITION:  Overall cognitive status: Within functional limits for tasks assessed     SENSATION: WFL  MUSCLE LENGTH: End range bil hamstring limited Lt lat limits full Lt shoulder flexion  POSTURE:  severe scoliosis, concave on Lt with lower ribcage and pelvis approximation  PALPATION: Diffuse tenderness cervical, thoracic soft tissues, very tight Lt latissimus dorsi, bil pectorals, Lt obliques and QL, Rt hip Non-pitting edema in Rt leg and foot present - under care of MD and DVT ruled out per chart review, on Lasix  CERVICAL ROM:   Active  A/PROM  eval  Flexion 57  Extension 35  Right lateral flexion 12  Left lateral flexion 15  Right rotation 40  Left rotation 40   (Blank rows = not tested)  LUMBAR ROM:   Active  A/PROM  eval  Flexion Fingers to mid shin  Extension Not tested  Right lateral flexion 5  Left lateral flexion 3  Right rotation 20%  Left rotation 20%   (Blank rows = not tested)  LOWER EXTREMITY ROM:     Passive  Right eval Left eval  Hip flexion    Hip extension    Hip abduction    Hip adduction    Hip internal rotation 35 40  Hip external rotation 50 60  Knee flexion    Knee extension    Ankle dorsiflexion    Ankle plantarflexion    Ankle inversion    Ankle eversion     (Blank rows = not tested)  LOWER EXTREMITY MMT:    4/5 bil LE   GAIT: Distance walked: within clinic Assistive device utilized: None Level of assistance: Complete Independence Comments: gets very winded    TODAY'S TREATMENT  Date: 08/19/22   Date: 08/14/22: NuStep L2 x 5' PT present to monitor Yellow tband bil UE x 10 each: horiz abd, ER, row,  extension    Sitting: upper quadrant STM bil to neck and shoulders    Rt SL: STM to Lt lateral trunk from pelvis to posterior shoulder with fascial release, elongation, cued deep slow inhale/exhale to open ribcage  Date: 08/12/22: NuStep L1 x 3' PT present to monitor (.08 miles) Sitting: upper quadrant STM bil to neck and shoulders Rt SL: STM to Lt lateral trunk from pelvis to posterior shoulder with fascial release, elongation, cued deep slow inhale/exhale to open ribcage Verbal review and PT demo of yellow tband series from HEP  Date: 08/07/22 Sitting: upper quadrant STM bil to neck and shoulders  Yellow tband standing x 15 each bil: row, shoulder ext, horiz abd, ER Rt SL: STM to Lt lateral trunk from pelvis to posterior shoulder with fascial release, active and passive elongation, trigger point release Lt glut med   PATIENT EDUCATION:  Education details: discussed plan of care and goals Person educated: Patient Education method: Explanation Education comprehension: verbalized understanding   HOME EXERCISE PROGRAM: Access Code: [email or web address] URL: [email or web address] Date: 08/07/2022 Prepared by: [place] Night Sidrah Harden  Exercises - Standing Row with Anchored Resistance  - 1 x daily - 7 x weekly - 2 sets - 10 reps - Standing Shoulder Extension with Resistance  - 1 x daily - 7 x weekly - 2 sets - 10 reps - Standing Shoulder External Rotation with Resistance  - 1 x daily - 7 x weekly - 2 sets - 10 reps -  Standing Shoulder Horizontal Abduction with Resistance  - 1 x daily - 7 x weekly - 2 sets - 10 reps  ASSESSMENT:  CLINICAL IMPRESSION: Pt's tissues demonstrate improving carry over between appointments with improved mobility of fascia and extensibility of muscles. Pt continues to have chronic pain related to severe scoliosis.  She was able to add time on NuStep today reaching 5'.  She demos good form with tband therex from HEP which she is compliant with.  Pt continues to  benefit from skilled manual techniques to reduce pain, decompress spine, and improve mobility.   OBJECTIVE IMPAIRMENTS decreased activity tolerance, decreased endurance, decreased mobility, decreased ROM, decreased strength, hypomobility, increased fascial restrictions, increased muscle spasms, impaired flexibility, impaired UE functional use, improper body mechanics, postural dysfunction, and pain.   ACTIVITY LIMITATIONS carrying, lifting, bending, standing, sleeping, transfers, and reach over head  PARTICIPATION LIMITATIONS: cleaning, laundry, driving, shopping, community activity, and yard work  PERSONAL FACTORS Age and Time since onset of injury/illness/exacerbation are also affecting patient's functional outcome.   REHAB POTENTIAL: Good  CLINICAL DECISION MAKING: Stable/uncomplicated  EVALUATION COMPLEXITY: Low   GOALS: Goals reviewed with patient? Yes  SHORT TERM GOALS: Target date: 07/31/22  Pt will be ind with initial HEP for trunk and LE mobility and stretching Baseline: Goal status: met    LONG TERM GOALS: Target date: 08/28/22  Pt will be ind with strategies for symptom management including pacing, spinal decompression, positioning, and HEP. Baseline:  Goal status: met  2.  Pt will improve bil hamstring length to WNL to reduce back pain Baseline:  Goal status: INITIAL  3.  Pt will be able to perform light daily tasks within home for up to 30 min before needing a positional rest break. Baseline:  Goal status: INITIAL  4.  Pt will achieve improved soft tissue mobility of Lt side of trunk and bil cervical region to reduce pain and decompress abdominal organs Baseline:  Goal status: met  5.  Improve neck rotation to at least 50 deg for improved visibility with driving. Baseline: 40 deg bil Goal status: INITIAL     PLAN: PT FREQUENCY: 1-2x/week  PT DURATION: 8 weeks  PLANNED INTERVENTIONS: Therapeutic exercises, Therapeutic activity, Neuromuscular  re-education, Balance training, Patient/Family education, Self Care, Joint mobilization, Aquatic Therapy, Dry Needling, Electrical stimulation, Spinal mobilization, Cryotherapy, Moist heat, and Manual therapy.  PLAN FOR NEXT SESSION: NuStep, tband postural strength, measure neck rotation for STM, manual techniques to open up Lt side of trunk, begin light stretching as tol for hamstrings, QL, pectorals, Lt lat (wall slide in doorway),   Angeleah Labrake, PT 08/19/22 7:51 AM

## 2022-08-26 ENCOUNTER — Ambulatory Visit: Payer: Medicare Other | Admitting: Physical Therapy

## 2022-08-26 ENCOUNTER — Encounter: Payer: Self-pay | Admitting: Physical Therapy

## 2022-08-26 DIAGNOSIS — M546 Pain in thoracic spine: Secondary | ICD-10-CM

## 2022-08-26 DIAGNOSIS — M6281 Muscle weakness (generalized): Secondary | ICD-10-CM

## 2022-08-26 DIAGNOSIS — M545 Low back pain, unspecified: Secondary | ICD-10-CM | POA: Diagnosis not present

## 2022-08-26 DIAGNOSIS — G8929 Other chronic pain: Secondary | ICD-10-CM | POA: Diagnosis not present

## 2022-08-26 DIAGNOSIS — M542 Cervicalgia: Secondary | ICD-10-CM | POA: Diagnosis not present

## 2022-08-26 DIAGNOSIS — M4125 Other idiopathic scoliosis, thoracolumbar region: Secondary | ICD-10-CM

## 2022-08-26 NOTE — Therapy (Signed)
OUTPATIENT PHYSICAL THERAPY TREATMENT   Patient Name: Janet Nguyen MRN: [id number] DOB:1932-06-23, 88 y.o., female Today's Date: 08/26/2022   PT End of Session - 08/26/22 1236     Visit Number 9    Date for PT Re-Evaluation 08/28/22    Authorization Type Medicare Part A/B, KX at visit 15    Progress Note Due on Visit 10    PT Start Time 1234    PT Stop Time 1315    PT Time Calculation (min) 41 min    Activity Tolerance Patient tolerated treatment well    Behavior During Therapy [facility] for tasks assessed/performed                     Past Medical History:  Diagnosis Date   Arrhythmia    History of SVT with documented PVC'S and  PAC'S  12/08/12 Nuc stress test normal LV EF 74%  Event Monitor  12/01/12-01/03/13   Atrial flutter ([place])    Celiac disease    treated by Dr. 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 Marijean Bravo; pt states she was diagnosed 10-15 years ago, not treated at this time or any issues that she is aware of.   Scoliosis    Scoliosis    Sleep apnea 10/02/11 [facility]   Sleep study AHI -total sleep 10.3/hr  64.0/ hr during REM sleep.RDI 22.8/hr during total sleep 64.0/hr during REM sleep The lowest O2 sat during Non-REM and REM sleep was 86% and 88% respectively. 04/08/12 CPAP/BIPAP titration study [facility]   Past Surgical History:  Procedure Laterality Date   APPENDECTOMY     ruptured at age 85 and had surgery   CARDIAC CATHETERIZATION  01/27/06   CARDIOVERSION N/A 01/08/2022   Procedure: CARDIOVERSION;  Surgeon: Freada Bergeron, MD;  Location[facility] ENDOSCOPY;  Service: Cardiovascular;  Laterality: N/A;   cataract surgery  2015   Dr. Herbert Deaner; March & April 2015   Patient Active Problem List   Diagnosis  Date Noted   Secondary hypercoagulable state ([place]) 12/12/2021   Hyponatremia 12/04/2021   Acute CHF (congestive heart failure) ([place]) 12/02/2021   Acute on chronic diastolic CHF (congestive heart failure) ([place]) 12/02/2021   Atrial fibrillation with rapid ventricular response ([place]) 12/02/2021   Essential hypertension 12/02/2021   GERD without esophagitis 12/02/2021   Obstructive sleep apnea 12/02/2021   Interstitial lung disease ([place]) 12/02/2021   History of pulmonary embolism 12/02/2021   Pulmonary embolism ([place]) 11/06/2021   Bronchiectasis ([place]) 11/06/2021   Deviated septum 05/08/2020   Mass of subcutaneous tissue 05/02/2020   Vertigo 06/16/2019   Pulmonary hypertension, unspecified ([place]) 04/14/2019   Ischemic colitis ([place]) 04/14/2019   Migraine with aura and without status migrainosus, not intractable 11/08/2018   Degeneration of lumbar intervertebral disc 10/14/2018   Hoarseness of voice 03/04/2018   Abdominal pain 07/01/2017   Diverticulitis, colon    Metabolic acidosis, increased anion gap    Constipation 04/14/2017   Lumbar hernia 04/14/2017   Presbycusis of both ears 01/10/2017   Tinnitus aurium, bilateral 01/10/2017   Gastroesophageal reflux disease 08/20/2016   Hemoptysis 08/20/2016   Obstructive sleep apnea of adult 08/20/2016   Rhinitis, chronic 08/20/2016   Throat pain in adult 08/20/2016  Fatigue 12/23/2015   Sciatica of right side 10/06/2015   History of migraine headaches 10/06/2015   Frequent PVCs 12/28/2013   Premature atrial contractions 12/28/2013   PSVT (paroxysmal supraventricular tachycardia) (HCC) 12/28/2013   Heart palpitations 07/13/2013   Sleep apnea 04/11/2013   Scoliosis 04/11/2013   Atypical atrial flutter ([place]) 11/29/2012   Chest pain, atypical 11/29/2012   Fibromyalgia syndrome 11/29/2012   Chronic steroid use 11/29/2012      REFERRING PROVIDER: Lawerance Cruel, MD   REFERRING DIAG: M54.9 (ICD-10-CM) - Back pain   Rationale for  Evaluation and Treatment Rehabilitation  THERAPY DIAG:  Other idiopathic scoliosis, thoracolumbar region  Muscle weakness (generalized)  Pain in thoracic spine  Cervicalgia  ONSET DATE: chronic LBP  SUBJECTIVE:      PT is helping me open up my left side.  I am feeling more upright and opened up.  I have some days that are better than others.                                                                                                                                                                                   SUBJECTIVE STATEMENT:   From evaluation: I have had a bad year.  I had PE, was hospitalized, have a swollen Rt leg, am now on Lasix, I'm in afib (cardioversion didn't stick).  Blood clot in leg was ruled out.  My leg swelling is much better than it was but still very swollen.  I am easily winded even when I'm talking.   I have lost 5 inches with my scoliosis.  I am so compressed and painful on my Lt side I have no room for my stomach so eating is very challenging.  I struggle to move my bowels.   I can only lay on my Rt side or my back.  Laying on my Rt side so much can start hurting my hip.   My neck and shoulders always hurt.   My activity level has diminished signif due to getting so easily winded.  I won't be able to do the NuStep given how winded I get.  I am trying to consistently get back into my stretches from last PT sessions.   PERTINENT HISTORY:  Pt has cardiac doctor, rheumatologist Complex medical history: of note October 25, 2021 was positive for PE, hospitalized on March 6 through December 04, 2021 with increased palpitations and was found to be in atrial fibrillation, cardioversion April 2023 was initially successful but not long term  Long history of pain from signif scoliosis, has been in/out of PT for this History of migraine headaches Sleep apnea without tolerance of CPAP, uses mouthguard  PAIN:  PAIN:  Are you having pain? Yes NPRS scale: 5/10 Pain  location: neck, back, shoulders, Rt hip Pain orientation: Right and Bilateral  PAIN TYPE: aching, throbbing, and tight Pain description: constant  Aggravating factors: activity, laying on Rt side Relieving factors: rest    PRECAUTIONS: None  WEIGHT BEARING RESTRICTIONS No  FALLS:  Has patient fallen in last 6 months? No  LIVING ENVIRONMENT: Lives with: lives alone Lives in: Other single level condo Stairs: No Has following equipment at home: None  OCCUPATION: retired  PLOF: Independent with household mobility without device and Independent with community mobility without device  PATIENT GOALS open up Lt side of trunk, work back into my stretches, less pain   OBJECTIVE:   DIAGNOSTIC FINDINGS:  No new imaging    SCREENING FOR RED FLAGS: Bowel or bladder incontinence: No Spinal tumors: No Cauda equina syndrome: No Compression fracture: No Abdominal aneurysm: No  COGNITION:  Overall cognitive status: Within functional limits for tasks assessed     SENSATION: WFL  MUSCLE LENGTH: End range bil hamstring limited Lt lat limits full Lt shoulder flexion  POSTURE:  severe scoliosis, concave on Lt with lower ribcage and pelvis approximation  PALPATION: Diffuse tenderness cervical, thoracic soft tissues, very tight Lt latissimus dorsi, bil pectorals, Lt obliques and QL, Rt hip Non-pitting edema in Rt leg and foot present - under care of MD and DVT ruled out per chart review, on Lasix  CERVICAL ROM:   Active  A/PROM  eval  Flexion 57  Extension 35  Right lateral flexion 12  Left lateral flexion 15  Right rotation 40  Left rotation 40   (Blank rows = not tested)  LUMBAR ROM:   Active  A/PROM  eval  Flexion Fingers to mid shin  Extension Not tested  Right lateral flexion 5  Left lateral flexion 3  Right rotation 20%  Left rotation 20%   (Blank rows = not tested)  LOWER EXTREMITY ROM:     Passive  Right eval Left eval  Hip flexion    Hip  extension    Hip abduction    Hip adduction    Hip internal rotation 35 40  Hip external rotation 50 60  Knee flexion    Knee extension    Ankle dorsiflexion    Ankle plantarflexion    Ankle inversion    Ankle eversion     (Blank rows = not tested)  LOWER EXTREMITY MMT:    4/5 bil LE   GAIT: Distance walked: within clinic Assistive device utilized: None Level of assistance: Complete Independence Comments: gets very winded    TODAY'S TREATMENT  Date: 08/26/22 Rt SL: STM to Lt lateral trunk from pelvis to posterior shoulder with fascial release, elongation, cued deep slow inhale/exhale to open ribcage, Lt scapular passive PNF for retraction depression, STM Lt lat and posterior shoulder Supine DKTC rocking side to side x20 Supine fig 4 stretch 3x10" bil Supine hamstring stretch with ankle circles x 20" bil Supine bridge x10 Supine shoulder horiz abd red band x 15 Supine d2 flexion draw the sword x 5 each way red band Sitting: upper quadrant STM bil to neck and shoulders  Date: 08/14/22: NuStep L2 x 5' PT present to monitor Yellow tband bil UE x 10 each: horiz abd, ER, row, extension    Sitting: upper quadrant STM bil to neck and shoulders    Rt SL: STM to Lt lateral trunk from pelvis to posterior shoulder with fascial release, elongation,  cued deep slow inhale/exhale to open ribcage   PATIENT EDUCATION:  Education details: discussed plan of care and goals Person educated: Patient Education method: Explanation Education comprehension: verbalized understanding   HOME EXERCISE PROGRAM: Access Code: [email or web address] URL: [email or web address] Date: 08/07/2022 Prepared by: Venetia Night Andria Head  Exercises - Standing Row with Anchored Resistance  - 1 x daily - 7 x weekly - 2 sets - 10 reps - Standing Shoulder Extension with Resistance  - 1 x daily - 7 x weekly - 2 sets - 10 reps - Standing Shoulder External Rotation with Resistance  - 1 x daily - 7 x weekly - 2  sets - 10 reps - Standing Shoulder Horizontal Abduction with Resistance  - 1 x daily - 7 x weekly - 2 sets - 10 reps  ASSESSMENT:  CLINICAL IMPRESSION: Pt's tissues demonstrate improving carry over between appointments with improved mobility of fascia and extensibility of muscles. Pt continues to have chronic pain related to severe scoliosis.  She is having some days that are better than others and feels more upright and elongated on Lt side.  Session focused on gentle mobility and postural strength alongside manual techniques.  ERO next time with plan to taper to 1x/week.   OBJECTIVE IMPAIRMENTS decreased activity tolerance, decreased endurance, decreased mobility, decreased ROM, decreased strength, hypomobility, increased fascial restrictions, increased muscle spasms, impaired flexibility, impaired UE functional use, improper body mechanics, postural dysfunction, and pain.   ACTIVITY LIMITATIONS carrying, lifting, bending, standing, sleeping, transfers, and reach over head  PARTICIPATION LIMITATIONS: cleaning, laundry, driving, shopping, community activity, and yard work  PERSONAL FACTORS Age and Time since onset of injury/illness/exacerbation are also affecting patient's functional outcome.   REHAB POTENTIAL: Good  CLINICAL DECISION MAKING: Stable/uncomplicated  EVALUATION COMPLEXITY: Low   GOALS: Goals reviewed with patient? Yes  SHORT TERM GOALS: Target date: 07/31/22  Pt will be ind with initial HEP for trunk and LE mobility and stretching Baseline: Goal status: met    LONG TERM GOALS: Target date: 08/28/22  Pt will be ind with strategies for symptom management including pacing, spinal decompression, positioning, and HEP. Baseline:  Goal status: met  2.  Pt will improve bil hamstring length to WNL to reduce back pain Baseline:  Goal status: ongoing  3.  Pt will be able to perform light daily tasks within home for up to 30 min before needing a positional rest  break. Baseline:  Goal status: met for some days of the week, ongoing  4.  Pt will achieve improved soft tissue mobility of Lt side of trunk and bil cervical region to reduce pain and decompress abdominal organs Baseline:  Goal status: met  5.  Improve neck rotation to at least 50 deg for improved visibility with driving. Baseline: 40 deg bil Goal status: INITIAL     PLAN: PT FREQUENCY: 1-2x/week  PT DURATION: 8 weeks  PLANNED INTERVENTIONS: Therapeutic exercises, Therapeutic activity, Neuromuscular re-education, Balance training, Patient/Family education, Self Care, Joint mobilization, Aquatic Therapy, Dry Needling, Electrical stimulation, Spinal mobilization, Cryotherapy, Moist heat, and Manual therapy.  PLAN FOR NEXT SESSION: ERO next time, NuStep, tband postural strength, measure neck rotation for STM, manual techniques to open up Lt side of trunk, begin light stretching as tol for hamstrings, QL, pectorals, Lt lat (wall slide in doorway),   Chrishawna Farina, PT 08/26/22 1:26 PM

## 2022-08-28 ENCOUNTER — Encounter: Payer: Self-pay | Admitting: Physical Therapy

## 2022-08-28 ENCOUNTER — Ambulatory Visit: Payer: Medicare Other | Admitting: Physical Therapy

## 2022-08-28 DIAGNOSIS — G8929 Other chronic pain: Secondary | ICD-10-CM | POA: Diagnosis not present

## 2022-08-28 DIAGNOSIS — M6281 Muscle weakness (generalized): Secondary | ICD-10-CM | POA: Diagnosis not present

## 2022-08-28 DIAGNOSIS — M546 Pain in thoracic spine: Secondary | ICD-10-CM | POA: Diagnosis not present

## 2022-08-28 DIAGNOSIS — M4125 Other idiopathic scoliosis, thoracolumbar region: Secondary | ICD-10-CM | POA: Diagnosis not present

## 2022-08-28 DIAGNOSIS — M542 Cervicalgia: Secondary | ICD-10-CM

## 2022-08-28 DIAGNOSIS — M545 Low back pain, unspecified: Secondary | ICD-10-CM | POA: Diagnosis not present

## 2022-08-28 NOTE — Therapy (Signed)
OUTPATIENT PHYSICAL THERAPY TREATMENT   Patient Name: Janet Nguyen MRN: [id number] DOB:Jun 12, 1932, 88 y.o., female Today's Date: 08/28/2022   PT End of Session - 08/28/22 1237     Visit Number 10    Date for PT Re-Evaluation 08/28/22    Authorization Type Medicare Part A/B, KX at visit 15    Progress Note Due on Visit 20    PT Start Time 1233    PT Stop Time 1311    PT Time Calculation (min) 38 min    Activity Tolerance Patient tolerated treatment well    Behavior During Therapy [facility] for tasks assessed/performed            Progress Note  Reporting Period 07/03/22 to 08/28/22  See note below for Objective Data and Assessment of Progress/Goals.              Past Medical History:  Diagnosis Date   Arrhythmia    History of SVT with documented PVC'S and  PAC'S  12/08/12 Nuc stress test normal LV EF 74%  Event Monitor  12/01/12-01/03/13   Atrial flutter ([place])    Celiac disease    treated by Dr. 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 Marijean Bravo; pt states she was diagnosed 10-15 years ago, not treated at this time or any issues that she is aware of.   Scoliosis    Scoliosis    Sleep apnea 10/02/11 [facility]   Sleep study AHI -total sleep 10.3/hr  64.0/ hr during REM sleep.RDI 22.8/hr during total sleep 64.0/hr during REM sleep The lowest O2 sat during Non-REM and REM sleep was 86% and 88% respectively. 04/08/12 CPAP/BIPAP titration study [facility]   Past Surgical History:  Procedure Laterality Date   APPENDECTOMY     ruptured at age 84 and had surgery   CARDIAC CATHETERIZATION  01/27/06   CARDIOVERSION N/A 01/08/2022   Procedure: CARDIOVERSION;  Surgeon: Freada Bergeron, MD;  Location: [facility] ENDOSCOPY;  Service:  Cardiovascular;  Laterality: N/A;   cataract surgery  2015   Dr. Herbert Deaner; March & April 2015   Patient Active Problem List   Diagnosis Date Noted   Secondary hypercoagulable state ([place]) 12/12/2021   Hyponatremia 12/04/2021   Acute CHF (congestive heart failure) ([place]) 12/02/2021   Acute on chronic diastolic CHF (congestive heart failure) ([place]) 12/02/2021   Atrial fibrillation with rapid ventricular response ([place]) 12/02/2021   Essential hypertension 12/02/2021   GERD without esophagitis 12/02/2021   Obstructive sleep apnea 12/02/2021   Interstitial lung disease ([place]) 12/02/2021   History of pulmonary embolism 12/02/2021   Pulmonary embolism ([place]) 11/06/2021   Bronchiectasis ([place]) 11/06/2021   Deviated septum 05/08/2020   Mass of subcutaneous tissue 05/02/2020   Vertigo 06/16/2019   Pulmonary hypertension, unspecified ([place]) 04/14/2019   Ischemic colitis ([place]) 04/14/2019   Migraine with aura and without status migrainosus, not intractable 11/08/2018   Degeneration of lumbar intervertebral disc 10/14/2018   Hoarseness of voice 03/04/2018   Abdominal pain 07/01/2017   Diverticulitis, colon    Metabolic acidosis, increased anion gap    Constipation 04/14/2017   Lumbar hernia 04/14/2017   Presbycusis of both ears 01/10/2017   Tinnitus aurium, bilateral 01/10/2017   Gastroesophageal reflux disease 08/20/2016  Hemoptysis 08/20/2016   Obstructive sleep apnea of adult 08/20/2016   Rhinitis, chronic 08/20/2016   Throat pain in adult 08/20/2016   Fatigue 12/23/2015   Sciatica of right side 10/06/2015   History of migraine headaches 10/06/2015   Frequent PVCs 12/28/2013   Premature atrial contractions 12/28/2013   PSVT (paroxysmal supraventricular tachycardia) ([place]) 12/28/2013   Heart palpitations 07/13/2013   Sleep apnea 04/11/2013   Scoliosis 04/11/2013   Atypical atrial flutter ([place]) 11/29/2012   Chest pain, atypical 11/29/2012   Fibromyalgia syndrome 11/29/2012   Chronic  steroid use 11/29/2012      REFERRING PROVIDER: Lawerance Cruel, MD   REFERRING DIAG: M54.9 (ICD-10-CM) - Back pain   Rationale for Evaluation and Treatment Rehabilitation  THERAPY DIAG:  Other idiopathic scoliosis, thoracolumbar region  Muscle weakness (generalized)  Pain in thoracic spine  Cervicalgia  ONSET DATE: chronic LBP  SUBJECTIVE:  The world is just always crushing me in every way.  Physically, mentally, emotionally.  I am tired of being out of breath all the time.  My compressed middle is getting to me.  I am unable to do my HEP as often - it's just too much pain.  I don't have help at home and realize I need help now.                                                                                                                                                                         SUBJECTIVE STATEMENT:  From evaluation: I have had a bad year.  I had PE, was hospitalized, have a swollen Rt leg, am now on Lasix, I'm in afib (cardioversion didn't stick).  Blood clot in leg was ruled out.  My leg swelling is much better than it was but still very swollen.  I am easily winded even when I'm talking.   I have lost 5 inches with my scoliosis.  I am so compressed and painful on my Lt side I have no room for my stomach so eating is very challenging.  I struggle to move my bowels.   I can only lay on my Rt side or my back.  Laying on my Rt side so much can start hurting my hip.   My neck and shoulders always hurt.   My activity level has diminished signif due to getting so easily winded.  I won't be able to do the NuStep given how winded I get.  I am trying to consistently get back into my stretches from last PT sessions.   PERTINENT HISTORY:  Pt has cardiac doctor, rheumatologist Complex medical history: of note October 25, 2021 was positive for PE, hospitalized on March 6 through December 04, 2021 with increased palpitations and was  found to be in atrial fibrillation,  cardioversion April 2023 was initially successful but not long term  Long history of pain from signif scoliosis, has been in/out of PT for this History of migraine headaches Sleep apnea without tolerance of CPAP, uses mouthguard  PAIN:  PAIN:  Are you having pain? Yes NPRS scale: 7-8/10 Pain location: neck, back, shoulders, Rt hip Pain orientation: Right and Bilateral  PAIN TYPE: aching, throbbing, and tight Pain description: constant  Aggravating factors: activity, laying on Rt side Relieving factors: rest    PRECAUTIONS: None  WEIGHT BEARING RESTRICTIONS No  FALLS:  Has patient fallen in last 6 months? No  LIVING ENVIRONMENT: Lives with: lives alone Lives in: Other single level condo Stairs: No Has following equipment at home: None  OCCUPATION: retired  PLOF: Independent with household mobility without device and Independent with community mobility without device  PATIENT GOALS open up Lt side of trunk, work back into my stretches, less pain   OBJECTIVE:   DIAGNOSTIC FINDINGS:  No new imaging    SCREENING FOR RED FLAGS: Bowel or bladder incontinence: No Spinal tumors: No Cauda equina syndrome: No Compression fracture: No Abdominal aneurysm: No  COGNITION:  Overall cognitive status: Within functional limits for tasks assessed     SENSATION: WFL  MUSCLE LENGTH: End range bil hamstring limited Lt lat limits full Lt shoulder flexion  POSTURE:  severe scoliosis, concave on Lt with lower ribcage and pelvis approximation  PALPATION: 08/28/22: improved soft tissue and fascial mobility along Lt trunk related to short compressed side of scoliosis, ongoing rigidity of ribcage and spine; improved Rt LE edema control with compression stockings  Evaluation: Diffuse tenderness cervical, thoracic soft tissues, very tight Lt latissimus dorsi, bil pectorals, Lt obliques and QL, Rt hip Non-pitting edema in Rt leg and foot present - under care of MD and DVT ruled out  per chart review, on Lasix  CERVICAL ROM:   Active  A/PROM  eval A/ROM 08/28/22  Flexion 57 65  Extension 35 35  Right lateral flexion 12 25  Left lateral flexion 15 25  Right rotation 40 35  Left rotation 40 35   (Blank rows = not tested)  LUMBAR ROM:   Active  A/PROM  eval A/ROM 11/30  Flexion Fingers to mid shin Fingers to ankles  Extension Not tested Not tested  Right lateral flexion 5 8  Left lateral flexion 3 5  Right rotation 20% 50%  Left rotation 20% 35%   (Blank rows = not tested)  LOWER EXTREMITY ROM:     Passive  Right eval Left eval Right 11/30 Left 11/30  Hip flexion      Hip extension      Hip abduction      Hip adduction      Hip internal rotation 35 40 35 45  Hip external rotation 50 60 60 65  Knee flexion      Knee extension      Ankle dorsiflexion      Ankle plantarflexion      Ankle inversion      Ankle eversion       (Blank rows = not tested)  LOWER EXTREMITY MMT:     11/30: 4/5 bil LE Eval: 4/5 bil LE   GAIT: Distance walked: within clinic Assistive device utilized: None Level of assistance: Complete Independence Comments: gets very winded    TODAY'S TREATMENT  Date: 08/28/22 NuStep L2 x 3' PT present to discuss status and monitor Reach up and  over in doorway Lt UE 2x20" Lt shoulder slides for lat stretch 3x10" Standing red band 1x10 each: bil shoulder ext and row Seated STM bil upper quadrants, thoracic paraspinals, QL, thorcodorsal fascia, gluteals Pt education: monotasking helps with stress, discipline self to take breaks from to-do list to decompress body position  Date: 08/26/22 Rt SL: STM to Lt lateral trunk from pelvis to posterior shoulder with fascial release, elongation, cued deep slow inhale/exhale to open ribcage, Lt scapular passive PNF for retraction depression, STM Lt lat and posterior shoulder Supine DKTC rocking side to side x20 Supine fig 4 stretch 3x10" bil Supine hamstring stretch with ankle circles x  20" bil Supine bridge x10 Supine shoulder horiz abd red band x 15 Supine d2 flexion draw the sword x 5 each way red band Sitting: upper quadrant STM bil to neck and shoulders  Date: 08/14/22: NuStep L2 x 5' PT present to monitor Yellow tband bil UE x 10 each: horiz abd, ER, row, extension    Sitting: upper quadrant STM bil to neck and shoulders    Rt SL: STM to Lt lateral trunk from pelvis to posterior shoulder with fascial release, elongation, cued deep slow inhale/exhale to open ribcage   PATIENT EDUCATION:  Education details: discussed plan of care and goals Person educated: Patient Education method: Explanation Education comprehension: verbalized understanding   HOME EXERCISE PROGRAM: Access Code: [email or web address] URL: [email or web address] Date: 08/07/2022 Prepared by: [place]ia Night Nakia Remmers  Exercises - Standing Row with Anchored Resistance  - 1 x daily - 7 x weekly - 2 sets - 10 reps - Standing Shoulder Extension with Resistance  - 1 x daily - 7 x weekly - 2 sets - 10 reps - Standing Shoulder External Rotation with Resistance  - 1 x daily - 7 x weekly - 2 sets - 10 reps - Standing Shoulder Horizontal Abduction with Resistance  - 1 x daily - 7 x weekly - 2 sets - 10 reps  ASSESSMENT:  CLINICAL IMPRESSION: Pt's tissues demonstrate improving carry over between appointments with improved mobility of fascia and extensibility of muscles. Pt continues to have chronic pain related to severe scoliosis.  She admits her mental health and stress is affecting her pain.  Pt education provided to encourage her to focus on monotasking, taking breaks from activity to get out of gravity, and find external sources of support.  Pt has improving hamstring length, cervical ROM and trunk ROM. She is working on progressing into a consistent routine for HEP.  We will plan to taper to 1x/week for ongoing manual therapy and therex for her chronic pain and to maximize her mobility.     OBJECTIVE  IMPAIRMENTS decreased activity tolerance, decreased endurance, decreased mobility, decreased ROM, decreased strength, hypomobility, increased fascial restrictions, increased muscle spasms, impaired flexibility, impaired UE functional use, improper body mechanics, postural dysfunction, and pain.   ACTIVITY LIMITATIONS carrying, lifting, bending, standing, sleeping, transfers, and reach over head  PARTICIPATION LIMITATIONS: cleaning, laundry, driving, shopping, community activity, and yard work  PERSONAL FACTORS Age and Time since onset of injury/illness/exacerbation are also affecting patient's functional outcome.   REHAB POTENTIAL: Good  CLINICAL DECISION MAKING: Stable/uncomplicated  EVALUATION COMPLEXITY: Low   GOALS: Goals reviewed with patient? Yes  SHORT TERM GOALS: Target date: 07/31/22  Pt will be ind with initial HEP for trunk and LE mobility and stretching Baseline: Goal status: met    LONG TERM GOALS: Target date: 08/28/22  Pt will be ind with strategies for symptom management including pacing, spinal decompression,  positioning, and HEP. Baseline:  Goal status: met  2.  Pt will improve bil hamstring length to WNL to reduce back pain Baseline:  Goal status: ongoing  3.  Pt will be able to perform light daily tasks within home for up to 30 min before needing a positional rest break. Baseline:  Goal status: met for some days of the week, ongoing  4.  Pt will achieve improved soft tissue mobility of Lt side of trunk and bil cervical region to reduce pain and decompress abdominal organs Baseline:  Goal status: met  5.  Improve neck rotation to at least 50 deg for improved visibility with driving. Baseline: 40 deg bil Goal status: ongoing, regressed to 35 deg on 11/30     PLAN: PT FREQUENCY: taper to 1x/week  PT DURATION: 8 weeks  PLANNED INTERVENTIONS: Therapeutic exercises, Therapeutic activity, Neuromuscular re-education, Balance training, Patient/Family  education, Self Care, Joint mobilization, Aquatic Therapy, Dry Needling, Electrical stimulation, Spinal mobilization, Cryotherapy, Moist heat, and Manual therapy.  PLAN FOR NEXT SESSION: NuStep, work towards consistent routine for HEP, conitnue manual therapy for ease of motion and release of tissues  Teren Franckowiak, PT 08/28/22 1:19 PM

## 2022-09-02 ENCOUNTER — Encounter: Payer: Self-pay | Admitting: Physical Therapy

## 2022-09-02 ENCOUNTER — Ambulatory Visit: Payer: Medicare Other | Attending: Family Medicine | Admitting: Physical Therapy

## 2022-09-02 DIAGNOSIS — G8929 Other chronic pain: Secondary | ICD-10-CM | POA: Diagnosis not present

## 2022-09-02 DIAGNOSIS — M6281 Muscle weakness (generalized): Secondary | ICD-10-CM | POA: Insufficient documentation

## 2022-09-02 DIAGNOSIS — M542 Cervicalgia: Secondary | ICD-10-CM | POA: Insufficient documentation

## 2022-09-02 DIAGNOSIS — M4125 Other idiopathic scoliosis, thoracolumbar region: Secondary | ICD-10-CM | POA: Diagnosis not present

## 2022-09-02 DIAGNOSIS — M546 Pain in thoracic spine: Secondary | ICD-10-CM | POA: Insufficient documentation

## 2022-09-02 DIAGNOSIS — R293 Abnormal posture: Secondary | ICD-10-CM | POA: Insufficient documentation

## 2022-09-02 DIAGNOSIS — M545 Low back pain, unspecified: Secondary | ICD-10-CM | POA: Diagnosis not present

## 2022-09-02 NOTE — Therapy (Signed)
OUTPATIENT PHYSICAL THERAPY TREATMENT   Patient Name: Janet Nguyen MRN: [id number] DOB:22-Feb-1932, 88 y.o., female Today's Date: 09/02/2022   PT End of Session - 09/02/22 1058     Visit Number 11    Date for PT Re-Evaluation 10/23/22    Authorization Type Medicare Part A/B, KX at visit 15    PT Start Time 1059    PT Stop Time 1140    PT Time Calculation (min) 41 min    Activity Tolerance Patient tolerated treatment well;Patient limited by pain    Behavior During Therapy [facility] for tasks assessed/performed              Past Medical History:  Diagnosis Date   Arrhythmia    History of SVT with documented PVC'S and  PAC'S  12/08/12 Nuc stress test normal LV EF 74%  Event Monitor  12/01/12-01/03/13   Atrial flutter (HCC)    Celiac disease    treated by Dr. 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 Marijean Bravo; pt states she was diagnosed 10-15 years ago, not treated at this time or any issues that she is aware of.   Scoliosis    Scoliosis    Sleep apnea 10/02/11 [facility]   Sleep study AHI -total sleep 10.3/hr  64.0/ hr during REM sleep.RDI 22.8/hr during total sleep 64.0/hr during REM sleep The lowest O2 sat during Non-REM and REM sleep was 86% and 88% respectively. 04/08/12 CPAP/BIPAP titration study [facility]   Past Surgical History:  Procedure Laterality Date   APPENDECTOMY     ruptured at age 25 and had surgery   CARDIAC CATHETERIZATION  01/27/06   CARDIOVERSION N/A 01/08/2022   Procedure: CARDIOVERSION;  Surgeon: Freada Bergeron, MD;  Location: [facility] ENDOSCOPY;  Service: Cardiovascular;  Laterality: N/A;   cataract surgery  2015   Dr. Herbert Deaner; March & April 2015   Patient Active Problem List   Diagnosis Date Noted   Secondary  hypercoagulable state ([place]) 12/12/2021   Hyponatremia 12/04/2021   Acute CHF (congestive heart failure) ([place]) 12/02/2021   Acute on chronic diastolic CHF (congestive heart failure) ([place]) 12/02/2021   Atrial fibrillation with rapid ventricular response ([place]) 12/02/2021   Essential hypertension 12/02/2021   GERD without esophagitis 12/02/2021   Obstructive sleep apnea 12/02/2021   Interstitial lung disease ([place]) 12/02/2021   History of pulmonary embolism 12/02/2021   Pulmonary embolism ([place]) 11/06/2021   Bronchiectasis ([place]) 11/06/2021   Deviated septum 05/08/2020   Mass of subcutaneous tissue 05/02/2020   Vertigo 06/16/2019   Pulmonary hypertension, unspecified ([place]) 04/14/2019   Ischemic colitis ([place]) 04/14/2019   Migraine with aura and without status migrainosus, not intractable 11/08/2018   Degeneration of lumbar intervertebral disc 10/14/2018   Hoarseness of voice 03/04/2018   Abdominal pain 07/01/2017   Diverticulitis, colon    Metabolic acidosis, increased anion gap    Constipation 04/14/2017   Lumbar hernia 04/14/2017   Presbycusis of both ears 01/10/2017   Tinnitus aurium, bilateral 01/10/2017   Gastroesophageal reflux disease 08/20/2016   Hemoptysis 08/20/2016   Obstructive sleep apnea of adult 08/20/2016   Rhinitis, chronic 08/20/2016   Throat pain in adult 08/20/2016   Fatigue 12/23/2015   Sciatica of right side 10/06/2015   History  of migraine headaches 10/06/2015   Frequent PVCs 12/28/2013   Premature atrial contractions 12/28/2013   PSVT (paroxysmal supraventricular tachycardia) ([place]) 12/28/2013   Heart palpitations 07/13/2013   Sleep apnea 04/11/2013   Scoliosis 04/11/2013   Atypical atrial flutter ([place]) 11/29/2012   Chest pain, atypical 11/29/2012   Fibromyalgia syndrome 11/29/2012   Chronic steroid use 11/29/2012      REFERRING PROVIDER: Lawerance Cruel, MD   REFERRING DIAG: M54.9 (ICD-10-CM) - Back pain   Rationale for Evaluation and Treatment  Rehabilitation  THERAPY DIAG:  Other idiopathic scoliosis, thoracolumbar region  Muscle weakness (generalized)  Pain in thoracic spine  Cervicalgia  Chronic bilateral low back pain without sciatica  Abnormal posture  ONSET DATE: chronic LBP  SUBJECTIVE:                                                                                                                                       My new chair came but a chair can only help my back so much.  SUBJECTIVE STATEMENT:  From evaluation: I have had a bad year.  I had PE, was hospitalized, have a swollen Rt leg, am now on Lasix, I'm in afib (cardioversion didn't stick).  Blood clot in leg was ruled out.  My leg swelling is much better than it was but still very swollen.  I am easily winded even when I'm talking.   I have lost 5 inches with my scoliosis.  I am so compressed and painful on my Lt side I have no room for my stomach so eating is very challenging.  I struggle to move my bowels.   I can only lay on my Rt side or my back.  Laying on my Rt side so much can start hurting my hip.   My neck and shoulders always hurt.   My activity level has diminished signif due to getting so easily winded.  I won't be able to do the NuStep given how winded I get.  I am trying to consistently get back into my stretches from last PT sessions.   PERTINENT HISTORY:  Pt has cardiac doctor, rheumatologist Complex medical history: of note October 25, 2021 was positive for PE, hospitalized on March 6 through December 04, 2021 with increased palpitations and was found to be in atrial fibrillation, cardioversion April 2023 was initially successful but not long term  Long history of pain from signif scoliosis, has been in/out of PT for this History of migraine headaches Sleep apnea without tolerance of CPAP, uses mouthguard  PAIN:  PAIN:  Are you having pain? Yes NPRS scale: 7-8/10 Pain location: neck, back, shoulders, Rt hip Pain orientation: Right and  Bilateral  PAIN TYPE: aching, throbbing, and tight Pain description: constant  Aggravating factors: activity, laying on Rt side Relieving factors: rest    PRECAUTIONS: None  WEIGHT BEARING RESTRICTIONS No  FALLS:  Has patient fallen in last  6 months? No  LIVING ENVIRONMENT: Lives with: lives alone Lives in: Other single level condo Stairs: No Has following equipment at home: None  OCCUPATION: retired  PLOF: Independent with household mobility without device and Independent with community mobility without device  PATIENT GOALS open up Lt side of trunk, work back into my stretches, less pain   OBJECTIVE:   DIAGNOSTIC FINDINGS:  No new imaging    SCREENING FOR RED FLAGS: Bowel or bladder incontinence: No Spinal tumors: No Cauda equina syndrome: No Compression fracture: No Abdominal aneurysm: No  COGNITION:  Overall cognitive status: Within functional limits for tasks assessed     SENSATION: WFL  MUSCLE LENGTH: End range bil hamstring limited Lt lat limits full Lt shoulder flexion  POSTURE:  severe scoliosis, concave on Lt with lower ribcage and pelvis approximation  PALPATION: 08/28/22: improved soft tissue and fascial mobility along Lt trunk related to short compressed side of scoliosis, ongoing rigidity of ribcage and spine; improved Rt LE edema control with compression stockings  Evaluation: Diffuse tenderness cervical, thoracic soft tissues, very tight Lt latissimus dorsi, bil pectorals, Lt obliques and QL, Rt hip Non-pitting edema in Rt leg and foot present - under care of MD and DVT ruled out per chart review, on Lasix  CERVICAL ROM:   Active  A/PROM  eval A/ROM 08/28/22  Flexion 57 65  Extension 35 35  Right lateral flexion 12 25  Left lateral flexion 15 25  Right rotation 40 35  Left rotation 40 35   (Blank rows = not tested)  LUMBAR ROM:   Active  A/PROM  eval A/ROM 11/30  Flexion Fingers to mid shin Fingers to ankles  Extension Not  tested Not tested  Right lateral flexion 5 8  Left lateral flexion 3 5  Right rotation 20% 50%  Left rotation 20% 35%   (Blank rows = not tested)  LOWER EXTREMITY ROM:     Passive  Right eval Left eval Right 11/30 Left 11/30  Hip flexion      Hip extension      Hip abduction      Hip adduction      Hip internal rotation 35 40 35 45  Hip external rotation 50 60 60 65  Knee flexion      Knee extension      Ankle dorsiflexion      Ankle plantarflexion      Ankle inversion      Ankle eversion       (Blank rows = not tested)  LOWER EXTREMITY MMT:     11/30: 4/5 bil LE Eval: 4/5 bil LE   GAIT: Distance walked: within clinic Assistive device utilized: None Level of assistance: Complete Independence Comments: gets very winded    TODAY'S TREATMENT  Date: 09/02/22 Seated STM bil upper quadrants, thoracic paraspinals, QL, thorcodorsal fascia, gluteals Seated neck ROM x 5each: flexion, flexion with rotation, bil SB Seated bil hip abd yellow loop band x 20 Sit to stand with yellow loop at knees x 10 Standing bil shoulder horiz abd red band x15 Standing d2 flexion red band x5 Standing counter: heel raise x10, high knee march x20 Counter hip abd and ext x 5 each bil  Date: 08/28/22 NuStep L2 x 3' PT present to discuss status and monitor Reach up and over in doorway Lt UE 2x20" Lt shoulder slides for lat stretch 3x10" Standing red band 1x10 each: bil shoulder ext and row Seated STM bil upper quadrants, thoracic paraspinals, QL, thorcodorsal  fascia, gluteals Pt education: monotasking helps with stress, discipline self to take breaks from to-do list to decompress body position  Date: 08/26/22 Rt SL: STM to Lt lateral trunk from pelvis to posterior shoulder with fascial release, elongation, cued deep slow inhale/exhale to open ribcage, Lt scapular passive PNF for retraction depression, STM Lt lat and posterior shoulder Supine DKTC rocking side to side x20 Supine fig 4  stretch 3x10" bil Supine hamstring stretch with ankle circles x 20" bil Supine bridge x10 Supine shoulder horiz abd red band x 15 Supine d2 flexion draw the sword x 5 each way red band Sitting: upper quadrant STM bil to neck and shoulders     PATIENT EDUCATION:  Education details: discussed plan of care and goals Person educated: Patient Education method: Explanation Education comprehension: verbalized understanding   HOME EXERCISE PROGRAM: Access Code: [email or web address] URL: [email or web address] Date: 08/07/2022 Prepared by: Venetia Night Ashea Winiarski  Exercises - Standing Row with Anchored Resistance  - 1 x daily - 7 x weekly - 2 sets - 10 reps - Standing Shoulder Extension with Resistance  - 1 x daily - 7 x weekly - 2 sets - 10 reps - Standing Shoulder External Rotation with Resistance  - 1 x daily - 7 x weekly - 2 sets - 10 reps - Standing Shoulder Horizontal Abduction with Resistance  - 1 x daily - 7 x weekly - 2 sets - 10 reps  ASSESSMENT:  CLINICAL IMPRESSION: Pt is tolerating ROM and therex with greater ease and endurance.  She continues to benefit greatly from manual therapy for chronic pain related to severe rigid scoliosis.  PT continues conversation with Pt about taking her daughter up on setting up some outside services to help her with housework and yardwork.     OBJECTIVE IMPAIRMENTS decreased activity tolerance, decreased endurance, decreased mobility, decreased ROM, decreased strength, hypomobility, increased fascial restrictions, increased muscle spasms, impaired flexibility, impaired UE functional use, improper body mechanics, postural dysfunction, and pain.   ACTIVITY LIMITATIONS carrying, lifting, bending, standing, sleeping, transfers, and reach over head  PARTICIPATION LIMITATIONS: cleaning, laundry, driving, shopping, community activity, and yard work  PERSONAL FACTORS Age and Time since onset of injury/illness/exacerbation are also affecting patient's  functional outcome.   REHAB POTENTIAL: Good  CLINICAL DECISION MAKING: Stable/uncomplicated  EVALUATION COMPLEXITY: Low   GOALS: Goals reviewed with patient? Yes  SHORT TERM GOALS: Target date: 07/31/22  Pt will be ind with initial HEP for trunk and LE mobility and stretching Baseline: Goal status: met    LONG TERM GOALS: Target date: 08/28/22  Pt will be ind with strategies for symptom management including pacing, spinal decompression, positioning, and HEP. Baseline:  Goal status: met  2.  Pt will improve bil hamstring length to WNL to reduce back pain Baseline:  Goal status: ongoing  3.  Pt will be able to perform light daily tasks within home for up to 30 min before needing a positional rest break. Baseline:  Goal status: met for some days of the week, ongoing  4.  Pt will achieve improved soft tissue mobility of Lt side of trunk and bil cervical region to reduce pain and decompress abdominal organs Baseline:  Goal status: met  5.  Improve neck rotation to at least 50 deg for improved visibility with driving. Baseline: 40 deg bil Goal status: ongoing, regressed to 35 deg on 11/30     PLAN: PT FREQUENCY: taper to 1x/week  PT DURATION: 8 weeks  PLANNED INTERVENTIONS: Therapeutic exercises, Therapeutic activity, Neuromuscular re-education,  Balance training, Patient/Family education, Self Care, Joint mobilization, Aquatic Therapy, Dry Needling, Electrical stimulation, Spinal mobilization, Cryotherapy, Moist heat, and Manual therapy.  PLAN FOR NEXT SESSION: NuStep, work towards consistent routine for HEP, conitnue manual therapy for ease of motion and release of tissues  Kenyia Wambolt, PT 09/02/22 11:44 AM

## 2022-09-10 ENCOUNTER — Ambulatory Visit: Payer: Medicare Other | Admitting: Physical Therapy

## 2022-09-10 ENCOUNTER — Encounter: Payer: Self-pay | Admitting: Physical Therapy

## 2022-09-10 DIAGNOSIS — M6281 Muscle weakness (generalized): Secondary | ICD-10-CM | POA: Diagnosis not present

## 2022-09-10 DIAGNOSIS — M545 Low back pain, unspecified: Secondary | ICD-10-CM | POA: Diagnosis not present

## 2022-09-10 DIAGNOSIS — M546 Pain in thoracic spine: Secondary | ICD-10-CM | POA: Diagnosis not present

## 2022-09-10 DIAGNOSIS — M542 Cervicalgia: Secondary | ICD-10-CM | POA: Diagnosis not present

## 2022-09-10 DIAGNOSIS — F419 Anxiety disorder, unspecified: Secondary | ICD-10-CM | POA: Diagnosis not present

## 2022-09-10 DIAGNOSIS — G8929 Other chronic pain: Secondary | ICD-10-CM | POA: Diagnosis not present

## 2022-09-10 DIAGNOSIS — R0609 Other forms of dyspnea: Secondary | ICD-10-CM | POA: Diagnosis not present

## 2022-09-10 DIAGNOSIS — M4125 Other idiopathic scoliosis, thoracolumbar region: Secondary | ICD-10-CM

## 2022-09-10 DIAGNOSIS — R293 Abnormal posture: Secondary | ICD-10-CM

## 2022-09-10 DIAGNOSIS — Z6822 Body mass index (BMI) 22.0-22.9, adult: Secondary | ICD-10-CM | POA: Diagnosis not present

## 2022-09-10 DIAGNOSIS — R609 Edema, unspecified: Secondary | ICD-10-CM | POA: Diagnosis not present

## 2022-09-10 NOTE — Therapy (Signed)
OUTPATIENT PHYSICAL THERAPY TREATMENT   Patient Name: Janet Nguyen MRN: [id number] DOB:[birth year], 88 y.o., female Today's Date: 09/10/2022   PT End of Session - 09/10/22 1227     Visit Number 12    Date for PT Re-Evaluation 10/23/22    Authorization Type Medicare Part A/B, KX at visit 15    Progress Note Due on Visit 3    PT Start Time 1230    PT Stop Time 1313    PT Time Calculation (min) 43 min    Activity Tolerance Patient tolerated treatment well;Patient limited by pain    Behavior During Therapy [facility] for tasks assessed/performed               Past Medical History:  Diagnosis Date   Arrhythmia    History of SVT with documented PVC'S and  PAC'S  12/08/12 Nuc stress test normal LV EF 74%  Event Monitor  12/01/12-01/03/13   Atrial flutter (HCC)    Celiac disease    treated by Dr. 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 Marijean Bravo; pt states she was diagnosed 10-15 years ago, not treated at this time or any issues that she is aware of.   Scoliosis    Scoliosis    Sleep apnea 10/02/11 [facility]   Sleep study AHI -total sleep 10.3/hr  64.0/ hr during REM sleep.RDI 22.8/hr during total sleep 64.0/hr during REM sleep The lowest O2 sat during Non-REM and REM sleep was 86% and 88% respectively. 04/08/12 CPAP/BIPAP titration study [facility]   Past Surgical History:  Procedure Laterality Date   APPENDECTOMY     ruptured at age 49 and had surgery   CARDIAC CATHETERIZATION  01/27/06   CARDIOVERSION N/A 01/08/2022   Procedure: CARDIOVERSION;  Surgeon: Freada Bergeron, MD;  Location: [facility] ENDOSCOPY;  Service: Cardiovascular;  Laterality: N/A;   cataract surgery  2015   Dr. Herbert Deaner; March & April 2015   Patient Active Problem List    Diagnosis Date Noted   Secondary hypercoagulable state ([place]) 12/12/2021   Hyponatremia 12/04/2021   Acute CHF (congestive heart failure) ([place]) 12/02/2021   Acute on chronic diastolic CHF (congestive heart failure) ([place]) 12/02/2021   Atrial fibrillation with rapid ventricular response ([place]) 12/02/2021   Essential hypertension 12/02/2021   GERD without esophagitis 12/02/2021   Obstructive sleep apnea 12/02/2021   Interstitial lung disease ([place]) 12/02/2021   History of pulmonary embolism 12/02/2021   Pulmonary embolism ([place]) 11/06/2021   Bronchiectasis ([place]) 11/06/2021   Deviated septum 05/08/2020   Mass of subcutaneous tissue 05/02/2020   Vertigo 06/16/2019   Pulmonary hypertension, unspecified ([place]) 04/14/2019   Ischemic colitis ([place]) 04/14/2019   Migraine with aura and without status migrainosus, not intractable 11/08/2018   Degeneration of lumbar intervertebral disc 10/14/2018   Hoarseness of voice 03/04/2018   Abdominal pain 07/01/2017   Diverticulitis, colon    Metabolic acidosis, increased anion gap    Constipation 04/14/2017   Lumbar hernia 04/14/2017   Presbycusis of both ears 01/10/2017   Tinnitus aurium, bilateral 01/10/2017   Gastroesophageal reflux disease 08/20/2016   Hemoptysis 08/20/2016   Obstructive sleep apnea of adult 08/20/2016   Rhinitis, chronic 08/20/2016   Throat pain in adult 08/20/2016   Fatigue 12/23/2015  Sciatica of right side 10/06/2015   History of migraine headaches 10/06/2015   Frequent PVCs 12/28/2013   Premature atrial contractions 12/28/2013   PSVT (paroxysmal supraventricular tachycardia) ([place]) 12/28/2013   Heart palpitations 07/13/2013   Sleep apnea 04/11/2013   Scoliosis 04/11/2013   Atypical atrial flutter ([place]) 11/29/2012   Chest pain, atypical 11/29/2012   Fibromyalgia syndrome 11/29/2012   Chronic steroid use 11/29/2012      REFERRING PROVIDER: Lawerance Cruel, MD   REFERRING DIAG: M54.9 (ICD-10-CM) - Back pain    Rationale for Evaluation and Treatment Rehabilitation  THERAPY DIAG:  Other idiopathic scoliosis, thoracolumbar region  Muscle weakness (generalized)  Pain in thoracic spine  Cervicalgia  Chronic bilateral low back pain without sciatica  Abnormal posture  ONSET DATE: chronic LBP  SUBJECTIVE:                                                                                                                                       I am tired overall.  I continue to have difficulty breathing with activity.    SUBJECTIVE STATEMENT:  From evaluation: I have had a bad year.  I had PE, was hospitalized, have a swollen Rt leg, am now on Lasix, I'm in afib (cardioversion didn't stick).  Blood clot in leg was ruled out.  My leg swelling is much better than it was but still very swollen.  I am easily winded even when I'm talking.   I have lost 5 inches with my scoliosis.  I am so compressed and painful on my Lt side I have no room for my stomach so eating is very challenging.  I struggle to move my bowels.   I can only lay on my Rt side or my back.  Laying on my Rt side so much can start hurting my hip.   My neck and shoulders always hurt.   My activity level has diminished signif due to getting so easily winded.  I won't be able to do the NuStep given how winded I get.  I am trying to consistently get back into my stretches from last PT sessions.   PERTINENT HISTORY:  Pt has cardiac doctor, rheumatologist Complex medical history: of note October 25, 2021 was positive for PE, hospitalized on March 6 through December 04, 2021 with increased palpitations and was found to be in atrial fibrillation, cardioversion April 2023 was initially successful but not long term  Long history of pain from signif scoliosis, has been in/out of PT for this History of migraine headaches Sleep apnea without tolerance of CPAP, uses mouthguard  PAIN:  PAIN:  Are you having pain? Yes NPRS scale: 7-8/10 Pain location:  neck, back, shoulders, Rt hip Pain orientation: Right and Bilateral  PAIN TYPE: aching, throbbing, and tight Pain description: constant  Aggravating factors: activity, laying on Rt side Relieving factors: rest    PRECAUTIONS: None  WEIGHT BEARING RESTRICTIONS  No  FALLS:  Has patient fallen in last 6 months? No  LIVING ENVIRONMENT: Lives with: lives alone Lives in: Other single level condo Stairs: No Has following equipment at home: None  OCCUPATION: retired  PLOF: Independent with household mobility without device and Independent with community mobility without device  PATIENT GOALS open up Lt side of trunk, work back into my stretches, less pain   OBJECTIVE:   DIAGNOSTIC FINDINGS:  No new imaging    SCREENING FOR RED FLAGS: Bowel or bladder incontinence: No Spinal tumors: No Cauda equina syndrome: No Compression fracture: No Abdominal aneurysm: No  COGNITION:  Overall cognitive status: Within functional limits for tasks assessed     SENSATION: WFL  MUSCLE LENGTH: End range bil hamstring limited Lt lat limits full Lt shoulder flexion  POSTURE:  severe scoliosis, concave on Lt with lower ribcage and pelvis approximation  PALPATION: 08/28/22: improved soft tissue and fascial mobility along Lt trunk related to short compressed side of scoliosis, ongoing rigidity of ribcage and spine; improved Rt LE edema control with compression stockings  Evaluation: Diffuse tenderness cervical, thoracic soft tissues, very tight Lt latissimus dorsi, bil pectorals, Lt obliques and QL, Rt hip Non-pitting edema in Rt leg and foot present - under care of MD and DVT ruled out per chart review, on Lasix  CERVICAL ROM:   Active  A/PROM  eval A/ROM 08/28/22  Flexion 57 65  Extension 35 35  Right lateral flexion 12 25  Left lateral flexion 15 25  Right rotation 40 35  Left rotation 40 35   (Blank rows = not tested)  LUMBAR ROM:   Active  A/PROM  eval A/ROM 11/30   Flexion Fingers to mid shin Fingers to ankles  Extension Not tested Not tested  Right lateral flexion 5 8  Left lateral flexion 3 5  Right rotation 20% 50%  Left rotation 20% 35%   (Blank rows = not tested)  LOWER EXTREMITY ROM:     Passive  Right eval Left eval Right 11/30 Left 11/30  Hip flexion      Hip extension      Hip abduction      Hip adduction      Hip internal rotation 35 40 35 45  Hip external rotation 50 60 60 65  Knee flexion      Knee extension      Ankle dorsiflexion      Ankle plantarflexion      Ankle inversion      Ankle eversion       (Blank rows = not tested)  LOWER EXTREMITY MMT:     11/30: 4/5 bil LE Eval: 4/5 bil LE   GAIT: Distance walked: within clinic Assistive device utilized: None Level of assistance: Complete Independence Comments: gets very winded    TODAY'S TREATMENT  Date: 09/10/22 Seated STM bil upper quadrants, thoracic paraspinals, pectorals Seated thoracic extension with pec stretch, passive by PT over back of chair Rt SL: Lt trunk STM for elongation along obliques, lats, paraspinals, QL Supine SKTC alt LE x20, hamstring stretch 3x10"    Date: 09/02/22 Seated STM bil upper quadrants, thoracic paraspinals, QL, thorcodorsal fascia, gluteals Seated neck ROM x 5each: flexion, flexion with rotation, bil SB Seated bil hip abd yellow loop band x 20 Sit to stand with yellow loop at knees x 10 Standing bil shoulder horiz abd red band x15 Standing d2 flexion red band x5 Standing counter: heel raise x10, high knee march x20 Counter hip abd  and ext x 5 each bil  Date: 08/28/22 NuStep L2 x 3' PT present to discuss status and monitor Reach up and over in doorway Lt UE 2x20" Lt shoulder slides for lat stretch 3x10" Standing red band 1x10 each: bil shoulder ext and row Seated STM bil upper quadrants, thoracic paraspinals, QL, thorcodorsal fascia, gluteals Pt education: monotasking helps with stress, discipline self to take  breaks from to-do list to decompress body position   PATIENT EDUCATION:  Education details: discussed plan of care and goals Person educated: Patient Education method: Explanation Education comprehension: verbalized understanding   HOME EXERCISE PROGRAM: Access Code: [email or web address] URL: [email or web address] Date: 08/07/2022 Prepared by: Venetia Night Vinita Prentiss  Exercises - Standing Row with Anchored Resistance  - 1 x daily - 7 x weekly - 2 sets - 10 reps - Standing Shoulder Extension with Resistance  - 1 x daily - 7 x weekly - 2 sets - 10 reps - Standing Shoulder External Rotation with Resistance  - 1 x daily - 7 x weekly - 2 sets - 10 reps - Standing Shoulder Horizontal Abduction with Resistance  - 1 x daily - 7 x weekly - 2 sets - 10 reps  ASSESSMENT:  CLINICAL IMPRESSION: Pt continues to have SOB with activity.  She greatly benefits from manual techniques to elongate and open up her Lt trunk.  PT continues to encourage her to take short walks to build endurance.  Pt has HEP for postural stretching and strengthening which she is partially compliant with.     OBJECTIVE IMPAIRMENTS decreased activity tolerance, decreased endurance, decreased mobility, decreased ROM, decreased strength, hypomobility, increased fascial restrictions, increased muscle spasms, impaired flexibility, impaired UE functional use, improper body mechanics, postural dysfunction, and pain.   ACTIVITY LIMITATIONS carrying, lifting, bending, standing, sleeping, transfers, and reach over head  PARTICIPATION LIMITATIONS: cleaning, laundry, driving, shopping, community activity, and yard work  PERSONAL FACTORS Age and Time since onset of injury/illness/exacerbation are also affecting patient's functional outcome.   REHAB POTENTIAL: Good  CLINICAL DECISION MAKING: Stable/uncomplicated  EVALUATION COMPLEXITY: Low   GOALS: Goals reviewed with patient? Yes  SHORT TERM GOALS: Target date: 07/31/22  Pt will  be ind with initial HEP for trunk and LE mobility and stretching Baseline: Goal status: met    LONG TERM GOALS: Target date: 08/28/22  Pt will be ind with strategies for symptom management including pacing, spinal decompression, positioning, and HEP. Baseline:  Goal status: met  2.  Pt will improve bil hamstring length to WNL to reduce back pain Baseline:  Goal status: ongoing  3.  Pt will be able to perform light daily tasks within home for up to 30 min before needing a positional rest break. Baseline:  Goal status: met for some days of the week, ongoing  4.  Pt will achieve improved soft tissue mobility of Lt side of trunk and bil cervical region to reduce pain and decompress abdominal organs Baseline:  Goal status: met  5.  Improve neck rotation to at least 50 deg for improved visibility with driving. Baseline: 40 deg bil Goal status: ongoing, regressed to 35 deg on 11/30     PLAN: PT FREQUENCY: taper to 1x/week  PT DURATION: 8 weeks  PLANNED INTERVENTIONS: Therapeutic exercises, Therapeutic activity, Neuromuscular re-education, Balance training, Patient/Family education, Self Care, Joint mobilization, Aquatic Therapy, Dry Needling, Electrical stimulation, Spinal mobilization, Cryotherapy, Moist heat, and Manual therapy.  PLAN FOR NEXT SESSION: NuStep, work towards consistent routine for HEP, conitnue manual therapy for ease of motion and  release of tissues  Adrine Hayworth, PT 09/10/22 1:26 PM

## 2022-09-12 ENCOUNTER — Ambulatory Visit: Payer: Medicare Other | Admitting: Cardiovascular Disease

## 2022-09-15 ENCOUNTER — Ambulatory Visit: Payer: Medicare Other | Attending: Cardiovascular Disease | Admitting: Cardiovascular Disease

## 2022-09-15 ENCOUNTER — Encounter: Payer: Self-pay | Admitting: Cardiovascular Disease

## 2022-09-15 VITALS — BP 122/72 | HR 76 | Wt 124.0 lb

## 2022-09-15 DIAGNOSIS — J479 Bronchiectasis, uncomplicated: Secondary | ICD-10-CM

## 2022-09-15 DIAGNOSIS — R600 Localized edema: Secondary | ICD-10-CM

## 2022-09-15 DIAGNOSIS — Z79899 Other long term (current) drug therapy: Secondary | ICD-10-CM

## 2022-09-15 DIAGNOSIS — Z86711 Personal history of pulmonary embolism: Secondary | ICD-10-CM

## 2022-09-15 DIAGNOSIS — R609 Edema, unspecified: Secondary | ICD-10-CM

## 2022-09-15 DIAGNOSIS — M4125 Other idiopathic scoliosis, thoracolumbar region: Secondary | ICD-10-CM | POA: Diagnosis not present

## 2022-09-15 DIAGNOSIS — I4819 Other persistent atrial fibrillation: Secondary | ICD-10-CM | POA: Diagnosis not present

## 2022-09-15 DIAGNOSIS — Z7901 Long term (current) use of anticoagulants: Secondary | ICD-10-CM

## 2022-09-15 MED ORDER — TORSEMIDE 20 MG PO TABS: ORAL_TABLET | ORAL | 3 refills | Status: DC

## 2022-09-15 NOTE — Patient Instructions (Addendum)
   Medication Instructions:  Stop Furosemide 20 mg as directed. Start Torsemide 20 mg daily. Tomorrow take 40 mg (2 tabs) in the morning and 40 mg (2 tabs) in the at noon. Then resume 20 mg daily.  Continue Eliquis 2.5 mg daily   *If you need a refill on your cardiac medications before your next appointment, please call your pharmacy*   Lab Work: Your physician recommends that you complete lab work today. CMP, CBC, TSH  If you have labs (blood work) drawn today and your tests are completely normal, you will receive your results only by: [place] (if you have MyChart) OR A paper copy in the mail If you have any lab test that is abnormal or we need to change your treatment, we will call you to review the results.   Testing/Procedures: NONE ordered at this time of appointment     Follow-Up: At [facility], you and your health needs are our priority.  As part of our continuing mission to provide you with exceptional heart care, we have created designated Provider Care Teams.  These Care Teams include your primary Cardiologist (physician) and Advanced Practice Providers (APPs -  Physician Assistants and Nurse Practitioners) who all work together to provide you with the care you need, when you need it.  We recommend signing up for the patient portal called "MyChart".  Sign up information is provided on this After Visit Summary.  MyChart is used to connect with patients for Virtual Visits (Telemedicine).  Patients are able to view lab/test results, encounter notes, upcoming appointments, etc.  Non-urgent messages can be sent to your provider as well.   To learn more about what you can do with MyChart, go to [email or web address].    Your next appointment:   3 month(s)  The format for your next appointment:   In Person  Provider:   Shelva Majestic, MD     Other Instructions   Important Information About [place]

## 2022-09-15 NOTE — Progress Notes (Signed)
50,000 patient ID: Janet Nguyen, female   DOB: [birth year], 88 y.o.   MRN: [id number]      Primary  M.D.: Dr. Mardene Sayer  HPI: Janet Nguyen is a 88 y.o. female who presents for a 6 month followup cardiology evaluation.  Janet Nguyen has a history of documented SVT and also has PACs and PVCs  treated with beta blocker therapy. In April 2014 I further titrated her beta blocker therapy after cardiac event monitor revealed several bursts of recurrent SVT up to 177 beats per minute in March.  In July after she had had 2 episodes of chest fluttering which each lasted over 30 minutes which he did take metoprolol tartrate with relief I recommended further titration of her Toprol to 75 mg in the morning and 50 mg at night and if necessary she could further titrate this to 75 twice a day.  She has a history of obstructive sleep apnea but despite multiple attempts at CPAP utilization she has not been able to tolerate this. She was referred to Dr. [name] and has a customized dental appliance with mandibular advancement with improvement in some of her symptomatology. Due concern that her teeth may be moving in more recently she has has not been using customized appliance daily but has been using her old non-customized mouthguard.  At times shen has awakened abruptly from a dream with her heart pounding and a sensation of hot flashes, gasping for breath.   She also states that her scoliosis is getting worse. She does have left-sided musculoskeletal type chest pain due to her spine angulation.  Beause of her significant scoliosis, she feels she must sleep on her back.   She has a history of GERD for which she has been taking Nexium.  She presents for evaluation.  I had scheduled her for an overnight oximetry to see if she is a candidate for supplemental oxygen at nighttime since she refused to use CPAP and only very rarely uses her customized mouthpiece.  She does wear a mouthguard to reduce  bruxism.  Oximetry study was performed overnight on February 23/24.  Her mean oxygen saturation was 93.12%.  She spent 12 minutes and 16 seconds with O2 sat duration below 88% with the lowest O2 sat duration of 80%. I tried to set her up for supplemental oxygen at bedtime.  However, she has been denied for this on multiple occasions by her insurance/Medicare.  She feels that she is sleeping better.  She can only sleep in her back due to scoliosis.  Her left side is concave; her right side is convex.  She has had issues with labile blood pressure. She has had issues with lower back discomfort and sciatica leading to emergency room evaluation in November 2016.  She saw Dr. [name] for neurosurgical evaluation. She has migraine headaches. She  Recently saw Dr. [name] for these migraine headaches.  She has been on Toprol-XL 75 mg twice a day for her palpitations and presently denies any awareness an extra heartbeats. She takes Xanax on an as-needed basis for anxiety.  She has been taking Nexium 20, no grams every other day for GERD. She states her scoliosis is getting worse.  Has been experiencing more fatigue.  She also notes some occasional left hand numbness.  In a mouth guard but no ureteral longer uses her mandibular advancement device and not tolerate CPAP therapy for her obstructive sleep apnea.    She had experienced left-sided chest discomfort which most likely  was related to her significant scoliosis the potential neuropathy causing intermittent left arm and hand numbness.  I slightly reduced her Toprol-XL which she had been taking 150 mg daily and a 75 twice a day regimen to 75 and milligrams in the morning and 50 mg with ultimate plan to decrease this to 50 twice a day.  She states when she reduce this, she began to notice more optical migraines and resume taking the higher dose Toprol at 75 twice a day with improvement.  She has seen Dr. [name] for her paresthesias.  She admits to significant anxiety.   She had requested zolpidem for sleep initiation and maintenance.  In the past, we had tried 6.25 slow-release version to help with sleep maintenance, but due to cost issues preferred the 5 mg sleep initiation dose.  She experiences optical migraine headaches.  She was being seen by neurologist, Dr. Melton Alar  She continues to have issues with her scoliosis causing discomfort in her neck and arm due to her distortion.  He tells me she underwent endoscopy and colonoscopy as well as banding of hemorrhoids.  She had a benign polyp removed.  She has issues with spastic colon.  Her sleep is better with zolpiden 5 mg.  She saw Kerin Ransom on 11/13/2016.  She had noticed that her hair was "thinning" and was concerned about this being the result of Toprol.  He suggested possibly decreasing Toprol but she preferred not until she had seen me.  Since that time, she continues to experience some optical migraines.  She has noticed some leg left neck discomfort.  She denies any patches of hair loss.  She denies differential arm weakness.  She continues to have difficulty with her scoliosis with hip pain and rib cage discomfort.   When I saw her in March 2018, there was a blood pressure differential of 118/78 in the left arm and 140/80 in the right arm.  She subsequently underwent upper extremity Doppler evaluation on 12/25/2016.  She was noted have mild heterogeneous plaque with stable 1-39% bilateral internal carotid stenoses.  She had normal subclavian arteries bilaterally.  There are patent vertebral arteries with antegrade flow.  She did not have any significant blood pressure differential and had triphasic waveforms in both her right and biphasic in her left brachial artery.  Left blood pressure was 8 mm higher than the right brachial pressure.  At times, she continues to experience some vague chest wall symptoms, which most likely related to her posture.  She denies significant shortness of breath.  She has to sleep on  her back because of her scoliosis.  Uses a chin strap to prevent oral breathing.  She is not on CPAP.   In June 2019 she had concerns about some of her medications possibly causing hair loss or memory loss.  She admits to having dry eyes.  She continues to have difficulty from her scolWsaw her in November 2019 at which time she,  felt well from a cardiac standphk and apparently had a new oral appliance made.  She used this initially but then started to notice some teeth discomfort.  Apparently this again was treated by Dr.e still admits to being tired.  She has issues with her abdomen being tight which she believes is related to her scoliosis.  Her GERD is controlled with pantoprazole.    She was seen by Dr. Mardene Sayer for her primary care.  She had developed a fungal infection of her toenail.  He had  given her to Adelino fine 20 mg.  She has not started this and was hesitant to do this due to potential interaction with metoprolol.  She also was evaluated by Dr. Brett Fairy.  According to Janet Nguyen she did not evaluate her ophthalmic migraines.  However she was planning to do a possible home study to reassess her sleep apnea.  In the past she did not tolerate any CPAP therapy and although initially tolerated customized oral appliance this seemed to cause difficulty with movement of her teeth.  She has been using the CALM app on her smart phone to help with relaxation and improve sleep and is chronically dependent on low-dose zolpidem prescribed by Dr. Harrington Challenger.  She denies chest pain.  Her palpitations are well controlled with her current dose of metoprolol 75 mils twice a day and she can to be on low-dose amlodipine 2.5 mg.    Since I saw her in June 2020, she underwent the home study by Dr. Theodoro Clock was done after several studies with no data.  According to Dr. Edwena Felty note, AHI was 26.5 which was moderate and during REM sleep AHI rose to 34.9.  Snoring was not recorded.  Apparently there was discussion of  possible Financial planner.  Janet Nguyen is not interested.  I saw her in October 2020 at which time she had continued issues with migraines and some vertigo for which he takes the Epley maneuver.  She continued to have discomfort related to her scoliosis with muscle discomfort on her chest.  She believes she is waking up during dreaming and not sleeping well.  Her blood pressure has increased.  At her prior evaluation, I had instituted low-dose amlodipine at 2.5 mg.  With her blood pressure elevated during that evaluation I further titrated this to 5 mg I recommended she continue taking Toprol XL 75 mg twice a day.  She did not have any episodes of palpitations or recurrent SVT.  I saw her in January 2021. She had seen  Dr. Jaynee Eagles of neurology.  She continued to experience some muscular neck aches which may be related to her scoliosis but do not sound ischemic in etiology.  She states she has some varicose veins in the right lower extremity and complains of trivial ankle swelling.  She denies palpitations.    I saw her in April 2021.  Over the previous several months she had felt well from a cardiovascular standpoint.  She continues to have issues with her scoliosis causing back issues as well as some left-sided discomfort along her rib cage.   She has sleep apnea and remotely did not tolerate CPAP or an oral appliance  She continues to be on amlodipine 5 mg daily, metoprolol succinate 75 mg twice a day both for blood pressure and her palpitations. GERD is fairly well controlled on pantoprazole. She continues to be on Librax which she states does offer improvement and she takes alprazolam on a as needed basis.  I saw her in October 2021 and since her prior evaluation she had undergone neurology evaluation by Dr. Tomi Likens and has had migraine issues with aura since young adulthood.  An MRI of the brain in 2017 showed mild chronic small vessel ischemic changes otherwise was unremarkable.  She had developed  swelling in her leg right greater than left and has varicose veins.  She underwent lower extremity venous study which revealed no DVT but there was evidence for venous reflux in the right saphenofemoral junction, right greater saphenous vein in the  thigh and right greater saphenous vein in the calf.  She has used compression stockings in the past.  There was no plan for intervention.  She has been undergoing physical therapy.  There is no change in her intermittent rib discomfort from her scoliosis.  She denies palpitations.    I saw her in May 2022.  Over the prior 6 months she continued to have issues with her rib cage and hip bone discomfort contributed by her scoliosis.  She has had issues with varicose veins.  She has to sleep on her side.  She was recently evaluated by Dr. Earlie Server for elevated monocytes who felt she was stable hematologically.  She denies any angina.  She is unaware of palpitations and continues to be on Toprol all succinate 75 mg twice a day.  She is also on amlodipine 5 mg for hypertension.  She has a prescription for alprazolam and rarely if ever takes this for anxiety.    When I saw her on October 10, 2021 she was continuing to have issues resulting from her scoliosis.  She was recently evaluated at [facility] and a chest CT revealed scattered areas of focal bronchiectasis and clustered nodules in the lower lungs, most pronounced in the right middle lobe and lingula.  There also was a part solid nodule of the right lower lobe measuring 7.5 mm in mean diameter with 4 mm solid component.  A follow-up noncontrast CT was recommended in 3 to 6 months.  She was experiencing increasing shortness of breath with activity and denied any anginal symptoms.  Since I saw her, she was evaluated by Dr. Chase Caller in follow-up of her chest CT for pulmonary evaluation of her bronchiectasis and shortness of breath.  Laboratory revealed an elevated D-dimer.  Subsequent VQ scan on October 25, 2021 was positive for PE.  A 2D echo Doppler study showed mildly elevated pulmonary artery pressure 38 mm with grade 2 diastolic dysfunction, severe left atrial enlargement with moderate right atrial enlargement, and moderately severe MR.  Venous Doppler was negative for DVT.  She was started on Eliquis and Spiriva.  She had pulmonary follow-up on February 3 and was last seen by Dr. Chase Caller on November 28, 2021.  She was hospitalized on March 6 through December 04, 2021 with increased palpitations and was found to be in atrial fibrillation.  On presentation sodium was 126 potassium 5.2.  Troponins were negative.  Chest x-ray showed cardiomegaly with bilateral atelectasis and small bilateral pleural effusions.  She was started on IV diuresis.  She was on beta-blocker with metoprolol succinate 75 mg and diltiazem was added for rate control.  Follow-up echo showed EF 60 to 65% with moderate dilation of bilateral atriums.  There is a small circumferential pericardial effusion.  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  During hospitalization she was seen by Dr. Johney Frame of cardiology.  She was discharged on December 04, 2021.  I saw her in follow-up of her hospitalization on December 18, 2021.  At that time, she felt somewhat improved.  However, her ECG revealed atrial fibrillation at 94 bpm.  She was less tachycardic.  She continues to have issues with her scoliosis.  She is on medical regimen of Eliquis 5 mg twice a day, metoprolol succinate 75 mg daily, diltiazem CD1 120 mg daily, furosemide 20 mg, as well as Spiriva.  During that evaluation, I  recommended further titration of her metoprolol succinate to 100 mg daily and that she take this in a 50 twice daily regimen for more stable blood pressure.  She continues to be on Eliquis for anticoagulation.  I schedule her for outpatient cardioversion.  Janet Nguyen  underwent DC cardioversion by Dr. Johney Frame on January 08, 2022.  She was successfully cardioverted.  Subsequently, she saw Dr. Chase Caller on January 24, 2022.  She apparently was to be on Spiriva and the flutter valve but apparently was not doing this.  I saw her for follow-up on Jan 29, 2022.  At that time she stated her blood pressure had been typically in the 664-403 systolic range.  She had noticed heart rate irregularity for the last 1 to 2 weeks.  Her ECG revealed that she is back in atrial fibrillation with coarse fibrillatory waves with ventricular rate at 97.  At that time I discussed options with the patient.  With her age of 88 years old I recommended further titration of diltiazem to 180 mg daily from her present dose of 120 mg.  I did not feel she was a candidate for amiodarone in light of her recent pulmonary history and recommended she be evaluated in the atrial fibrillation clinic for consideration of possible initiation of other antiarrhythmic treatment.  She was scheduled to have a follow-up chest CT in the future with Dr. Chase Caller.  She was evaluated by Malka So on Feb 04, 2022.  At that time there was significant discussion concerning possible admission for dofetilide and reportedly she was agreeable.  He recommended that she continue diltiazem 180 mg daily, Toprol 50 mg twice a day, and Eliquis 5 mg twice a day.  Apparently, subsequently Janet Nguyen had real concerns about the hospitalization and canceled the tentatively scheduled admission.    I saw her on March 03, 2022 for follow-up cardiology evaluation.  She had reduced her diltiazem back to the 120 mg dose since she felt the 180 mg dose was causing stomach upset.  She admitted to be under significant stress trying to figure out all her health issues.  She had significant reservations about Tikosyn loading.  During that evaluation we discussed options and felt with her age rate control strategy may be most appropriate.  However in order to  improve heart rate response I added digoxin at 0.0625 mg.  I last saw her on April 30, 2022.  Since her prior evaluation since she underwent pulmonary evaluation with Dr. Chase Caller.  She was not satisfied with that appointment and remains frustrated.  Her CT scan had been done which showed improvement in prior lung nodule.  She was using flutter valve for her bronchiectasis.  Presently, she denies any chest pain.  She has had issues with leg swelling right greater than left and has issues with varicose veins.  With her progressive scoliosis she notes some discomfort from compression of her stomach.  She admits to some itching.  She still experiences some shortness of breath.  Her CT scan did not show any recurrent PE a pulmonary trunk was enlarged indicative of pulmonary artery hypertension.  Laboratory from July 20 showed BNP elevated at 870.  D-dimer was mildly increased at 0.73, improved from January 2023.  During that evaluation she had progressive lower extremity edema and was taking furosemide 20 mg daily.  I suggested she increase furosemide to 40 mg in the morning until the edema improved then try alternating 40 with 20 every other day.  Since I saw  her, she has been evaluated by Fabian Sharp on September 7 and most recently on July 24, 2022.  Her swelling had improved.  She was concerned about Eliquis bleed risk.  She was not having chest pain.  Presently, Janet Nguyen admits to progressive lower extremity edema bilaterally particularly most prominent in the right lower extremity up to the knees.  She has been on Eliquis now at the reduced dose of 2.5 mg twice a day, diltiazem 120 daily.  She has been taking furosemide 40 mg instead of alternating with 20 mg.  She is on metoprolol succinate 75 mg twice a day and digoxin 0.0625 mg daily for additional rate control.  She takes Spiriva inhalation.  She presents for evaluation.  Past Medical History:  Diagnosis Date   Arrhythmia    History of SVT  with documented PVC'S and  PAC'S  12/08/12 Nuc stress test normal LV EF 74%  Event Monitor  12/01/12-01/03/13   Atrial flutter ([place])    Celiac disease    treated by Dr. 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 Marijean Bravo; pt states she was diagnosed 10-15 years ago, not treated at this time or any issues that she is aware of.   Scoliosis    Scoliosis    Sleep apnea 10/02/11 [facility]   Sleep study AHI -total sleep 10.3/hr  64.0/ hr during REM sleep.RDI 22.8/hr during total sleep 64.0/hr during REM sleep The lowest O2 sat during Non-REM and REM sleep was 86% and 88% respectively. 04/08/12 CPAP/BIPAP titration study [facility]    Past Surgical History:  Procedure Laterality Date   APPENDECTOMY     ruptured at age 60 and had surgery   CARDIAC CATHETERIZATION  01/27/06   CARDIOVERSION N/A 01/08/2022   Procedure: CARDIOVERSION;  Surgeon: Freada Bergeron, MD;  Location: [facility] END[facility]COPY;  Service: Cardiovascular;  Laterality: N/A;   cataract surgery  2015   Dr. Herbert Deaner; March & April 2015    Allergies  Allergen Reactions   Gluten Meal Other (See Comments)    Unknown   Naproxen Other (See Comments)    Stomach upset    Current Outpatient Medications  Medication Sig Dispense Refill   acetaminophen (TYLENOL) 500 MG tablet Take 500 mg by mouth every 8 (eight) hours as needed for moderate pain.     Aloe-Sodium Chloride (AYR SALINE NASAL GEL NA) Place 1 application  into the nose daily as needed (rhinitis).     ALPRAZolam (XANAX) 0.5 MG tablet Take 0.25-0.5 mg by mouth 2 (two) times daily as needed for anxiety.     apixaban (ELIQUIS) 2.5 MG TABS tablet Take 1 tablet (2.5 mg total) by mouth 2 (two) times daily. 180 tablet 3   apixaban (ELIQUIS) 5 MG TABS  tablet Take 1 tablet (5 mg total) by mouth 2 (two) times daily. 42 tablet 0   Biotin w/ Vitamins C & E (HAIR SKIN & NAILS GUMMIES PO) Take 1 tablet by mouth daily.     Calcium Carb-Ergocalciferol (CHEWABLE CALCIUM/D PO) Take 1 each by mouth daily.     clidinium-chlordiazePOXIDE (LIBRAX) 5-2.5 MG capsule Take 1 capsule by mouth daily as needed. 60 capsule 3   digoxin (LANOXIN) 0.125 MG tablet TAKE 1/2 TABLET BY MOUTH  DAILY 15 tablet 3   diltiazem (CARDIZEM CD) 120 MG 24 hr capsule Take 1 capsule (120 mg total) by mouth every morning.     fluticasone (FLONASE) 50 MCG/ACT nasal spray Place 1 spray into both nostrils daily as needed for allergies or rhinitis.     Metoprolol Tartrate 75 MG TABS Take 1 tablet twice a day by oral route.     mirtazapine (REMERON) 15 MG tablet Take 15 mg by mouth at bedtime.     Phenylephrine-APAP-guaiFENesin (EQ SINUS CONGESTION & PAIN PO) Take 1 tablet by mouth daily as needed (for sinus pain). Contains acetaminophen 325 mg and Phenylephrine 5 mg     polyethylene glycol powder (GLYCOLAX/MIRALAX) 17 GM/SCOOP powder Take 17 g by mouth daily as needed for moderate constipation.     potassium chloride (KLOR-CON M) 10 MEQ tablet Take 1 tablet (10 mEq total) by mouth daily. 90 tablet 3   Tiotropium Bromide Monohydrate (SPIRIVA RESPIMAT) 1.25 MCG/ACT AERS Inhale 2 puffs into the lungs daily. 4 g 5   zolpidem (AMBIEN) 5 MG tablet Take 2.5 mg by mouth at bedtime.     metoprolol succinate (TOPROL-XL) 50 MG 24 hr tablet TAKE 1 AND 1/2 TABLETS BY MOUTH TWO TIMES A DAY (Patient not taking: Reported on 09/15/2022) 270 tablet 3   torsemide (DEMADEX) 20 MG tablet Take 1 tablet (20 mg total) by mouth 2 (two) times daily. 90 tablet 3   No current facility-administered medications for this visit.    Socially she is divorced has 4 children 9 grandchildren. She does exercise. No tobacco use. She does occasional wine.  ROS General: Negative; No fevers, chills, or night sweats;  HEENT:  Negative; No changes in vision or hearing, sinus congestion, difficulty swallowing Pulmonary:  unprovoked PE January 2023, bronchiectasis Cardiovascular: See HPI GI: Negative; No nausea, vomiting, diarrhea, or abdominal pain GU: Negative; No dysuria, hematuria, or difficulty voiding Musculoskeletal: Positive for significant scoliosis; fibromyalgia;  joint pain, or weakness Hematologic/Oncology: Negative; no easy bruising, bleeding Endocrine: Negative; no heat/cold intolerance; no diabetes Neuro: History of migraine headaches Skin: Positive for fungal infection of her toenail Psychiatric: Negative; No behavioral problems, depression Sleep: Positive for sleep apnea ; No snoring, daytime sleepiness, hypersomnolence, bruxism, restless legs, hypnogognic hallucinations, no cataplexy Other comprehensive 14 point system review is negative.  PE BP 122/72   Pulse 76   Wt 124 lb (56.2 kg)   SpO2 91%   BMI 23.43 kg/m      Wt Readings from Last 3 Encounters:  09/15/22 124 lb (56.2 kg)  07/24/22 120 lb (54.4 kg)  07/21/22 116 lb 12.8 oz (53 kg)    General: Alert, oriented, no distress.  Skin: normal turgor, no rashes, warm and dry HEENT: Normocephalic, atraumatic. Pupils equal round and reactive to light; sclera anicteric; extraocular muscles intact;  Nose without nasal septal hypertrophy Mouth/Parynx benign; Mallinpatti scale 2 Neck: No JVD, no carotid bruits; normal carotid upstroke Lungs: clear to ausculatation and percussion; no wheezing or rales Chest wall: without tenderness to palpitation Heart: PMI not displaced, irregular irregular consistent with atrial fibrillation rate in the 70s, s1 s2 normal, 1/6 systolic murmur, no diastolic murmur, no rubs, gallops, thrills, or heaves Abdomen: soft, nontender; no hepatosplenomehaly, BS+; abdominal aorta nontender and not dilated by palpation. Back:  Pulses 2+ Musculoskeletal: full range of motion, normal strength, no joint  deformities Extremities: Bilateral pitting edema 3+ on the right and 2+ on the left extending to the knees no clubbing cyanosis or edema, Homan's sign  negative  Neurologic: grossly nonfocal; Cranial nerves grossly wnl Psychologic: Normal mood and affect     September 15, 2022 ECG (independently read by me): Atrial fibrillation at 76, right axis; PRWP  April 30, 2022 ECG (independently read by me): Atrial fibrillation at 86, LAHB, PRWP   March 03, 2022 ECG (independently read by me): Atrial fibrillation at 97, QS V1-3  Jan 29, 2022 ECG (independently read by me): Probable A fib with coarse fibrilatory waves at 97 bpm vs A flutter with variable block; QTc 441  December 19, 2021 ECG (independently read by me):  Atrial fibrillation at 94, LAHB    October 10, 2021 ECG (independently read by me): Sinus bradycardia at 59,   Feb 13, 2021 ECG (independently read by me): Sinus bradycardia at 59 with mild arhythmia  October 2021 ECG (independently read by me): Sinus rhythm at 67 with mild arrythmia; norma intervals  April 2021 ECG (independently read by me): Normal sinus rhythm at 69 bpm. No ectopy. Normal intervals.  October 18, 2019 ECG (independently read by me): Sinus bradycardia 59 bpm.  QS complex V1 V2.  Normal intervals.  No ectopy  October 2020 ECG (independently read by me): Normal sinus rhythm at 63 bpm.  QS complex V1 V2.  No ectopy.  Normal intervals.  June 2020 ECG (independently read by me): NSR at 62; no ectopy; normal intervals   November 2019 ECG (independently read by me): Normal sinus rhythm with PAC.  Ventricular rate 66.  QS V1 V2.  Mild T wave abnormality  June 2019 ECG (independently read by me): Normal sinus rhythm at 64 bpm.  Normal intervals.  No ectopy.  September 2018 ECG (independently read by me): Normal sinus rhythm at 64 bpm.  Isolated PAC.  QTc interval 414 ms.  QS V1 V2, unchanged.  May 2018 ECG (independently read by me): Normal sinus rhythm at 65 bpm.  QS V1,  V2.  Normal intervals.  No ST segment changes.  March 2018 ECG (independently read by me): Normal sinus rhythm at 65 bpm.  No ectopy.  Normal intervals.  September 2017 ECG (independently read by me): Normal sinus rhythm at 62 bpm.  QS V1 and V2.  Normal intervals.  April 2017 ECG (independently read by me): Normal sinus rhythm at 70 bpm.  No ectopy.  PR interval 158 ms and QTc interval 414 ms.  March 2017 ECG (independently read by me): normal sinus rhythm at 61 bpm..  No ST segment changes.  Normal intervals.  QTc interval 396 ms.  August 2014 ECG (independently read by me): Normal sinus rhythm at 63 bpm.  QRS couplets V1 V2.  No cigarette ST-T changes.  May 2016 ECG (independently read by me): Sinus bradycardia 59 bpm.  No ectopy.  February 2016 ECG (independently read by me): Sinus bradycardia 56 bpm.  Normal intervals.  Prior ECG (independently read by me): Normal sinus rhythm at 62 beats per minute.  QTc interval 411 milliseconds.  QRS complex V1, V2.  Normal intervals.  ECG (independently read by me): Normal sinus rhythm at 62 beats per minute. Normal intervals. QTc interval 399 ms.  Prior ECG of 07/13/2013: Sinus rhythm at 61 beats per minute. QTc interval 406 ms. PR interval normal at 168 ms.  LABS:    Latest Ref Rng & Units 09/15/2022    4:04 PM 06/06/2022    9:29 AM 05/14/2022   10:35 AM  BMP  Glucose 70 - 99 mg/dL 105  102  92  BUN 8 - 27 mg/dL _0 Creatinine 0.57 - 1.00 mg/dL 1.07  1.07  1.01   BUN/Creat Ratio 12 - _1 Sodium 134 - 144 mmol/L 136  137  133   Potassium 3.5 - 5.2 mmol/L 4.3  4.7  4.6   Chloride 96 - 106 mmol/L 95  96  96   CO2 20 - 29 mmol/L _2 Calcium 8.7 - 10.3 mg/dL 9.7  9.7  9.7       Latest Ref Rng & Units 09/15/2022    4:04 PM 05/14/2022   10:35 AM 01/29/2022    4:06 PM  Hepatic Function  Total Protein 6.0 - 8.5 g/dL 7.3  7.2  7.6   Albumin 3.7 - 4.7 g/dL 4.2  4.5  4.6   AST 0 - 40 IU/L _3 ALT 0 - 32  IU/L _4 Alk Phosphatase 44 - 121 IU/L 101  82  69   Total Bilirubin 0.0 - 1.2 mg/dL 1.1  0.9  0.5       Latest Ref Rng & Units 09/15/2022    4:04 PM 04/17/2022    2:07 PM 01/29/2022    4:06 PM  CBC  WBC 3.4 - 10.8 x10E3/uL 7.7  5.8  7.3   Hemoglobin 11.1 - 15.9 g/dL 14.4  12.6  13.7   Hematocrit 34.0 - 46.6 % 43.3  38.7  41.4   Platelets 150 - 450 x10E3/uL 269  205.0  296    Lab Results  Component Value Date   TSH 1.220 09/15/2022  Lipid Panel     Component Value Date/Time   CHOL 154 12/03/2016 0845   TRIG 81 12/03/2016 0845   HDL 67 12/03/2016 0845   CHOLHDL 2.3 12/03/2016 0845   VLDL 16 12/03/2016 0845   LDLCALC 71 12/03/2016 0845     RADIOLOGY:  CT CHEST 10/09/2021 IMPRESSION: 1. 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 Chronic aspiration could have a similar appearance. 2. Part solid nodule of the right lower lobe measuring 7.5 mm in mean diameter with 4 mm solid component. Follow-up non-contrast CT recommended at 3-6 months to confirm persistence. If unchanged, and solid component remains <6 mm, annual CT is recommended until 5 years of stability has been established. If persistent these nodules should be considered highly suspicious if the solid component of the nodule is 6 mm or greater in size and enlarging. This recommendation follows the consensus statement: Guidelines for Management of Incidental Pulmonary Nodules Detected on CT Images: From the Fleischner Society 2017; Radiology 2017; 284:228-243. 3.  Aortic Atherosclerosis (ICD10-I70.0).     ECHO: 12/03/2021  1. Left ventricular ejection fraction, by estimation, is 60 to 65%. Left  ventricular ejection fraction by 2D MOD biplane is 60.8 %. The left  ventricle has normal function. The left  ventricle has no regional wall  motion abnormalities. There is mild left  ventricular hypertrophy. Left ventricular diastolic function could not be  evaluated.   2. Left atrial size was moderately dilated.   3. Right atrial size was moderately dilated.   4. The pericardial effusion is circumferential. There is no evidence of  cardiac tamponade.   5. The  mitral valve is myxomatous. Moderate mitral valve regurgitation.  There is moderate late systolic prolapse of the middle scallop of the  posterior leaflet of the mitral valve.   6. Tricuspid valve regurgitation is moderate.   7. The aortic valve is tricuspid. Aortic valve sclerosis is present, with  no evidence of aortic valve stenosis.   8. There is normal pulmonary artery systolic pressure. The estimated  right ventricular systolic pressure is 32.9 mmHg.   9. The inferior vena cava is dilated in size with >50% respiratory  variability, suggesting right atrial pressure of 8 mmHg.   Comparison(s): Changes from prior study are noted. 10/15/2021: LVEF 63%,  moderate to severe MR with posterior leaflet prolapse, grade 2 DD.   IMPRESSION:  1. Bilateral lower extremity edema   2. Medication management   3. Persistent atrial fibrillation ([place])   4. Chronic anticoagulation   5. History of pulmonary embolism   6. Bronchiectasis without complication (HCC)   7. Other idiopathic scoliosis, thoracolumbar region     ASSESSMENT AND PLAN: Janet Nguyen is an 88 year old female who has a remote history of SVT and PACs/PVCs which were controlled with beta blocker therapy. An echo in March 5188  normal systolic function with grade 1 diastolic dysfunction and had significant left atrial dilatation and mild dilatation of the right ventricle with moderate dilatation of the right atrium. She had mild/moderate pulmonary hypertension with PA pressures of 42 mm. An echo in October 2018 showed an EF of 55 to 60% with grade 1 diastolic dysfunction, mitral annular  calcification with mild MR, and mild pulmonary hypertension with PA pressure 34 mm. Her left atrium was mild to moderately dilated.  She had venous reflux on lower extremity reflux evaluation which did not show any evidence of DVT in October 2021.  A chest CT in January 2023 was done for increasing shortness of breath and revealed probable reactive mediastinal lymph nodes, scattered focal areas of bronchiectasis and clustered nodules in the lower lobes as well as a solitary nodule in the right lower lobe at 7.5 mm with a 4 mm solid component.  She subsequently was found to have pulmonary embolism on January 23 and was started on Eliquis anticoagulation in addition to Spiriva for her shortness of breath by Dr. Chase Caller.  She was hospitalized with atrial fibrillation with RVR on December 02, 2021.  Her echo Doppler study at that time showed an EF of 60 to 65% with mild LVH there was moderate biatrial enlargement, small circumferential pericardial effusion and she had moderate late systolic prolapse of the middle scallop of the posterior leaflet of the mitral valve resulting in moderate mitral regurgitation.  There is aortic sclerosis.  When I saw her in March 2023 she continued to be in atrial fibrillation at that time metoprolol succinate was further titrated to 100 mg daily but I recommended she take 50 mg twice a day for more blood pressure stability.  She underwent successful cardioversion on January 08, 2022 performed by Dr. Johney Frame.  She was found to be in persistent atrial fibrillation on subsequent evaluation at which time her diltiazem dose was increased to 180 mg and she was referred to atrial fibrillation clinic.  She is not a candidate for amiodarone due to her lung disease.  It was advised that she consider admission for Tikosyn initiation at low-dose but she canceled the admission.  When I saw her when I saw her in June 2023 we had a lengthy discussion and she had  reduced her dose of diltiazem down to 120  mg.  She did not feel confident about pursuing Tikosyn and her decision was to attempt rate control.  I added digoxin 0.0625 mg for additional rate control.  A subsequent CT scan was done and she has seen Dr. Chase Caller.  She has bronchiectasis.  Her previous lung nodule had improved.  However her last several office visits, she has developed progressive bilateral lower extremity edema.  Her dose of furosemide has been increased to 40 mg for several days and 40 alternating with 20 mg every other day.  With her 3+ edema on the right and 2+ on the left today, I have suggested she discontinue furosemide and change this to torsemide.  For the first 2 to 3 days she will take 40 mg in the morning and 20 mg in the afternoon and if edema improves she can then change to 20 mg twice a day.  She is on reduced dose of Eliquis 2.5 mg twice a day.  She is followed by Dr. Chase Caller with her remote PE and bronchiectasis.  She continues to have progressive scoliosis issues causing discomfort and pain and affecting her chest and lung expansion.  She is also concerned that radon has been detected in her house.  I am recommending follow-up chemistry, CBC and TSH laboratory.  She is undergoing Ortho rehabilitation.  I will see her in 3 months for reevaluation.    Troy Sine, MD, [facility]  09/19/2022 9:09 AM

## 2022-09-16 LAB — COMPREHENSIVE METABOLIC PANEL
ALT: 15 IU/L (ref 0–32)
AST: 25 IU/L (ref 0–40)
Albumin/Globulin Ratio: 1.4 (ref 1.2–2.2)
Albumin: 4.2 g/dL (ref 3.7–4.7)
Alkaline Phosphatase: 101 IU/L (ref 44–121)
BUN/Creatinine Ratio: 19 (ref 12–28)
BUN: 20 mg/dL (ref 8–27)
Bilirubin Total: 1.1 mg/dL (ref 0.0–1.2)
CO2: 24 mmol/L (ref 20–29)
Calcium: 9.7 mg/dL (ref 8.7–10.3)
Chloride: 95 mmol/L — ABNORMAL LOW (ref 96–106)
Creatinine, Ser: 1.07 mg/dL — ABNORMAL HIGH (ref 0.57–1.00)
Globulin, Total: 3.1 g/dL (ref 1.5–4.5)
Glucose: 105 mg/dL — ABNORMAL HIGH (ref 70–99)
Potassium: 4.3 mmol/L (ref 3.5–5.2)
Sodium: 136 mmol/L (ref 134–144)
Total Protein: 7.3 g/dL (ref 6.0–8.5)
eGFR: 50 mL/min/{1.73_m2} — ABNORMAL LOW (ref >59)

## 2022-09-16 LAB — CBC
Hematocrit: 43.3 % (ref 34.0–46.6)
Hemoglobin: 14.4 g/dL (ref 11.1–15.9)
MCH: 29.7 pg (ref 26.6–33.0)
MCHC: 33.3 g/dL (ref 31.5–35.7)
MCV: 89 fL (ref 79–97)
Platelets: 269 10*3/uL (ref 150–450)
RBC: 4.85 x10E6/uL (ref 3.77–5.28)
RDW: 13.2 % (ref 11.7–15.4)
WBC: 7.7 10*3/uL (ref 3.4–10.8)

## 2022-09-16 LAB — TSH: TSH: 1.22 u[IU]/mL (ref 0.450–4.500)

## 2022-09-17 ENCOUNTER — Encounter: Payer: Self-pay | Admitting: Physical Therapy

## 2022-09-17 ENCOUNTER — Telehealth: Payer: Self-pay | Admitting: Cardiovascular Disease

## 2022-09-17 ENCOUNTER — Ambulatory Visit: Payer: Medicare Other | Admitting: Physical Therapy

## 2022-09-17 DIAGNOSIS — M6281 Muscle weakness (generalized): Secondary | ICD-10-CM | POA: Diagnosis not present

## 2022-09-17 DIAGNOSIS — R293 Abnormal posture: Secondary | ICD-10-CM

## 2022-09-17 DIAGNOSIS — M4125 Other idiopathic scoliosis, thoracolumbar region: Secondary | ICD-10-CM | POA: Diagnosis not present

## 2022-09-17 DIAGNOSIS — M546 Pain in thoracic spine: Secondary | ICD-10-CM

## 2022-09-17 DIAGNOSIS — M542 Cervicalgia: Secondary | ICD-10-CM

## 2022-09-17 DIAGNOSIS — G8929 Other chronic pain: Secondary | ICD-10-CM | POA: Diagnosis not present

## 2022-09-17 DIAGNOSIS — M545 Low back pain, unspecified: Secondary | ICD-10-CM | POA: Diagnosis not present

## 2022-09-17 NOTE — Telephone Encounter (Signed)
Left message to call back  

## 2022-09-17 NOTE — Therapy (Signed)
OUTPATIENT PHYSICAL THERAPY TREATMENT   Patient Name: Janet Nguyen MRN: [id number] DOB:09-17-1932, 88 y.o., female Today's Date: 09/17/2022   PT End of Session - 09/17/22 1223     Visit Number 13    Date for PT Re-Evaluation 10/23/22    Authorization Type Medicare Part A/B, KX at visit 15    Progress Note Due on Visit 49    PT Start Time 1230    PT Stop Time 1315    PT Time Calculation (min) 45 min    Activity Tolerance Patient tolerated treatment well;Patient limited by pain    Behavior During Therapy [facility] for tasks assessed/performed                Past Medical History:  Diagnosis Date   Arrhythmia    History of SVT with documented PVC'S and  PAC'S  12/08/12 Nuc stress test normal LV EF 74%  Event Monitor  12/01/12-01/03/13   Atrial flutter (HCC)    Celiac disease    treated by Dr. 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 Marijean Bravo; pt states she was diagnosed 10-15 years ago, not treated at this time or any issues that she is aware of.   Scoliosis    Scoliosis    Sleep apnea 10/02/11 [facility]   Sleep study AHI -total sleep 10.3/hr  64.0/ hr during REM sleep.RDI 22.8/hr during total sleep 64.0/hr during REM sleep The lowest O2 sat during Non-REM and REM sleep was 86% and 88% respectively. 04/08/12 CPAP/BIPAP titration study [facility]   Past Surgical History:  Procedure Laterality Date   APPENDECTOMY     ruptured at age 32 and had surgery   CARDIAC CATHETERIZATION  01/27/06   CARDIOVERSION N/A 01/08/2022   Procedure: CARDIOVERSION;  Surgeon: Freada Bergeron, MD;  Location: [facility] ENDOSCOPY;  Service: Cardiovascular;  Laterality: N/A;   cataract surgery  2015   Dr. Herbert Deaner; March & April 2015   Patient Active Problem List    Diagnosis Date Noted   Secondary hypercoagulable state ([place]) 12/12/2021   Hyponatremia 12/04/2021   Acute CHF (congestive heart failure) ([place]) 12/02/2021   Acute on chronic diastolic CHF (congestive heart failure) ([place]) 12/02/2021   Atrial fibrillation with rapid ventricular response ([place]) 12/02/2021   Essential hypertension 12/02/2021   GERD without esophagitis 12/02/2021   Obstructive sleep apnea 12/02/2021   Interstitial lung disease ([place]) 12/02/2021   History of pulmonary embolism 12/02/2021   Pulmonary embolism ([place]) 11/06/2021   Bronchiectasis ([place]) 11/06/2021   Deviated septum 05/08/2020   Mass of subcutaneous tissue 05/02/2020   Vertigo 06/16/2019   Pulmonary hypertension, unspecified ([place]) 04/14/2019   Ischemic colitis ([place]) 04/14/2019   Migraine with aura and without status migrainosus, not intractable 11/08/2018   Degeneration of lumbar intervertebral disc 10/14/2018   Hoarseness of voice 03/04/2018   Abdominal pain 07/01/2017   Diverticulitis, colon    Metabolic acidosis, increased anion gap    Constipation 04/14/2017   Lumbar hernia 04/14/2017   Presbycusis of both ears 01/10/2017   Tinnitus aurium, bilateral 01/10/2017   Gastroesophageal reflux disease 08/20/2016   Hemoptysis 08/20/2016   Obstructive sleep apnea of adult 08/20/2016   Rhinitis, chronic 08/20/2016   Throat pain in adult 08/20/2016   Fatigue  12/23/2015   Sciatica of right side 10/06/2015   History of migraine headaches 10/06/2015   Frequent PVCs 12/28/2013   Premature atrial contractions 12/28/2013   PSVT (paroxysmal supraventricular tachycardia) ([place]) 12/28/2013   Heart palpitations 07/13/2013   Sleep apnea 04/11/2013   Scoliosis 04/11/2013   Atypical atrial flutter ([place]) 11/29/2012   Chest pain, atypical 11/29/2012   Fibromyalgia syndrome 11/29/2012   Chronic steroid use 11/29/2012      REFERRING PROVIDER: Lawerance Cruel, MD   REFERRING DIAG: M54.9 (ICD-10-CM) - Back pain    Rationale for Evaluation and Treatment Rehabilitation  THERAPY DIAG:  Other idiopathic scoliosis, thoracolumbar region  Muscle weakness (generalized)  Pain in thoracic spine  Cervicalgia  Abnormal posture  Chronic bilateral low back pain without sciatica  ONSET DATE: chronic LBP  SUBJECTIVE:                                                                                                                                       I really don't like feeling this way.  I'm tired and in pain.  SUBJECTIVE STATEMENT:  From evaluation: I have had a bad year.  I had PE, was hospitalized, have a swollen Rt leg, am now on Lasix, I'm in afib (cardioversion didn't stick).  Blood clot in leg was ruled out.  My leg swelling is much better than it was but still very swollen.  I am easily winded even when I'm talking.   I have lost 5 inches with my scoliosis.  I am so compressed and painful on my Lt side I have no room for my stomach so eating is very challenging.  I struggle to move my bowels.   I can only lay on my Rt side or my back.  Laying on my Rt side so much can start hurting my hip.   My neck and shoulders always hurt.   My activity level has diminished signif due to getting so easily winded.  I won't be able to do the NuStep given how winded I get.  I am trying to consistently get back into my stretches from last PT sessions.   PERTINENT HISTORY:  Pt has cardiac doctor, rheumatologist Complex medical history: of note October 25, 2021 was positive for PE, hospitalized on March 6 through December 04, 2021 with increased palpitations and was found to be in atrial fibrillation, cardioversion April 2023 was initially successful but not long term  Long history of pain from signif scoliosis, has been in/out of PT for this History of migraine headaches Sleep apnea without tolerance of CPAP, uses mouthguard  PAIN:  PAIN:  Are you having pain? Yes NPRS scale: 7-8/10 Pain location: neck, back,  shoulders, Rt hip Pain orientation: Right and Bilateral  PAIN TYPE: aching, throbbing, and tight Pain description: constant  Aggravating factors: activity, laying on Rt side Relieving factors: rest    PRECAUTIONS: None  WEIGHT BEARING  RESTRICTIONS No  FALLS:  Has patient fallen in last 6 months? No  LIVING ENVIRONMENT: Lives with: lives alone Lives in: Other single level condo Stairs: No Has following equipment at home: None  OCCUPATION: retired  PLOF: Independent with household mobility without device and Independent with community mobility without device  PATIENT GOALS open up Lt side of trunk, work back into my stretches, less pain   OBJECTIVE:   DIAGNOSTIC FINDINGS:  No new imaging    SCREENING FOR RED FLAGS: Bowel or bladder incontinence: No Spinal tumors: No Cauda equina syndrome: No Compression fracture: No Abdominal aneurysm: No  COGNITION:  Overall cognitive status: Within functional limits for tasks assessed     SENSATION: WFL  MUSCLE LENGTH: End range bil hamstring limited Lt lat limits full Lt shoulder flexion  POSTURE:  severe scoliosis, concave on Lt with lower ribcage and pelvis approximation  PALPATION: 08/28/22: improved soft tissue and fascial mobility along Lt trunk related to short compressed side of scoliosis, ongoing rigidity of ribcage and spine; improved Rt LE edema control with compression stockings  Evaluation: Diffuse tenderness cervical, thoracic soft tissues, very tight Lt latissimus dorsi, bil pectorals, Lt obliques and QL, Rt hip Non-pitting edema in Rt leg and foot present - under care of MD and DVT ruled out per chart review, on Lasix  CERVICAL ROM:   Active  A/PROM  eval A/ROM 08/28/22  Flexion 57 65  Extension 35 35  Right lateral flexion 12 25  Left lateral flexion 15 25  Right rotation 40 35  Left rotation 40 35   (Blank rows = not tested)  LUMBAR ROM:   Active  A/PROM  eval A/ROM 11/30  Flexion  Fingers to mid shin Fingers to ankles  Extension Not tested Not tested  Right lateral flexion 5 8  Left lateral flexion 3 5  Right rotation 20% 50%  Left rotation 20% 35%   (Blank rows = not tested)  LOWER EXTREMITY ROM:     Passive  Right eval Left eval Right 11/30 Left 11/30  Hip flexion      Hip extension      Hip abduction      Hip adduction      Hip internal rotation 35 40 35 45  Hip external rotation 50 60 60 65  Knee flexion      Knee extension      Ankle dorsiflexion      Ankle plantarflexion      Ankle inversion      Ankle eversion       (Blank rows = not tested)  LOWER EXTREMITY MMT:     11/30: 4/5 bil LE Eval: 4/5 bil LE   GAIT: Distance walked: within clinic Assistive device utilized: None Level of assistance: Complete Independence Comments: gets very winded    TODAY'S TREATMENT  Date: 09/17/22 Seated STM bil upper quadrants, thoracic paraspinals, pectorals Rt SL: Lt trunk STM for elongation along obliques, lats, paraspinals, QL NuStep L1 x 3' PT present to monitor tolerance  Date: 09/10/22 Seated STM bil upper quadrants, thoracic paraspinals, pectorals Seated thoracic extension with pec stretch, passive by PT over back of chair Rt SL: Lt trunk STM for elongation along obliques, lats, paraspinals, QL Supine SKTC alt LE x20, hamstring stretch 3x10"    Date: 09/02/22 Seated STM bil upper quadrants, thoracic paraspinals, QL, thorcodorsal fascia, gluteals Seated neck ROM x 5each: flexion, flexion with rotation, bil SB Seated bil hip abd yellow loop band x 20 Sit to stand  with yellow loop at knees x 10 Standing bil shoulder horiz abd red band x15 Standing d2 flexion red band x5 Standing counter: heel raise x10, high knee march x20 Counter hip abd and ext x 5 each bil   PATIENT EDUCATION:  Education details: discussed plan of care and goals Person educated: Patient Education method: Explanation Education comprehension: verbalized  understanding   HOME EXERCISE PROGRAM: Access Code: [email or web address] URL: [email or web address] Date: 08/07/2022 Prepared by: Venetia Night Ygnacio Fecteau  Exercises - Standing Row with Anchored Resistance  - 1 x daily - 7 x weekly - 2 sets - 10 reps - Standing Shoulder Extension with Resistance  - 1 x daily - 7 x weekly - 2 sets - 10 reps - Standing Shoulder External Rotation with Resistance  - 1 x daily - 7 x weekly - 2 sets - 10 reps - Standing Shoulder Horizontal Abduction with Resistance  - 1 x daily - 7 x weekly - 2 sets - 10 reps  ASSESSMENT:  CLINICAL IMPRESSION: Pt continues to have SOB with activity.  She has not been getting out to walk. She was only able to tolerate 3' on Nustep today with a short break halfway through to catch her breath.  She feels burdened by chronic pain.  She had a big day with an outing to give gifts to friends and felt she was still recovering from that today.  She greatly benefits from manual techniques to elongate and open up her Lt trunk.  PT continues to encourage her to take short walks to build endurance.  Pt has HEP for postural stretching and strengthening which she is partially compliant with.     OBJECTIVE IMPAIRMENTS decreased activity tolerance, decreased endurance, decreased mobility, decreased ROM, decreased strength, hypomobility, increased fascial restrictions, increased muscle spasms, impaired flexibility, impaired UE functional use, improper body mechanics, postural dysfunction, and pain.   ACTIVITY LIMITATIONS carrying, lifting, bending, standing, sleeping, transfers, and reach over head  PARTICIPATION LIMITATIONS: cleaning, laundry, driving, shopping, community activity, and yard work  PERSONAL FACTORS Age and Time since onset of injury/illness/exacerbation are also affecting patient's functional outcome.   REHAB POTENTIAL: Good  CLINICAL DECISION MAKING: Stable/uncomplicated  EVALUATION COMPLEXITY: Low   GOALS: Goals reviewed  with patient? Yes  SHORT TERM GOALS: Target date: 07/31/22  Pt will be ind with initial HEP for trunk and LE mobility and stretching Baseline: Goal status: met    LONG TERM GOALS: Target date: 08/28/22  Pt will be ind with strategies for symptom management including pacing, spinal decompression, positioning, and HEP. Baseline:  Goal status: met  2.  Pt will improve bil hamstring length to WNL to reduce back pain Baseline:  Goal status: ongoing  3.  Pt will be able to perform light daily tasks within home for up to 30 min before needing a positional rest break. Baseline:  Goal status: met for some days of the week, ongoing  4.  Pt will achieve improved soft tissue mobility of Lt side of trunk and bil cervical region to reduce pain and decompress abdominal organs Baseline:  Goal status: met  5.  Improve neck rotation to at least 50 deg for improved visibility with driving. Baseline: 40 deg bil Goal status: ongoing, regressed to 35 deg on 11/30     PLAN: PT FREQUENCY: taper to 1x/week  PT DURATION: 8 weeks  PLANNED INTERVENTIONS: Therapeutic exercises, Therapeutic activity, Neuromuscular re-education, Balance training, Patient/Family education, Self Care, Joint mobilization, Aquatic Therapy, Dry Needling, Electrical stimulation, Spinal mobilization, Cryotherapy, Moist heat,  and Manual therapy.  PLAN FOR NEXT SESSION: NuStep, work towards consistent routine for HEP, conitnue manual therapy for ease of motion and release of tissues  Nazli Penn, PT 09/17/22 1:16 PM

## 2022-09-17 NOTE — Telephone Encounter (Signed)
Called patient, she states she understands she can take 2 tablets (40 mg) in the AM and 2 tablets (40 mg) in the PM. However patient states that she does not know how to take it the following day, she states she should be taking 1 tablet in the AM, and 1 tablet in the PM. However instructions just say 1 tablet daily.   Will ask MD, nurse covering.   Thanks!

## 2022-09-17 NOTE — Telephone Encounter (Signed)
Patient is calling to talk with Dr. [name] or nurse about her medication usage. Please call back

## 2022-09-17 NOTE — Telephone Encounter (Signed)
Left message to call back  Advised she can also send [place] a MyChart message

## 2022-09-17 NOTE — Telephone Encounter (Signed)
Patient stated she would like clarification on the correct dosage for taking her torsemide (DEMADEX) 20 MG tablet .  Patient stated she was told to take 20 mg, twice daily.  Pharmacy is giving her other directions.

## 2022-09-17 NOTE — Telephone Encounter (Signed)
Pt returning call

## 2022-09-18 MED ORDER — TORSEMIDE 20 MG PO TABS: 20.0000 mg | ORAL_TABLET | Freq: Two times a day (BID) | ORAL | 3 refills | Status: DC

## 2022-09-18 NOTE — Telephone Encounter (Signed)
Received call from Nurse Almyra Free) per conversation with Dr. [name] patient is to continue 73m Torsemide in the morning and 254mTorsemide in the evening for a total of 408maily. Patient aware and verbalized understanding. Prescription updated.   Patient states that she has not yet started the Torsemide and will start tomorrow. Advised patient to make sure to stop the Furosemide, patient repeated back to me she will Start Torsemide 83m81m the morning and 83mg53mthe afternoon tomorrow for one day, Patient will then resume Torsemide 20mg 36mhe morning and 20mg i16me afternoon thereafter.   Will check with MD to see if BMET is needed after switch to torsemide and let patient know. Patient would also like nurse to check on her tomorrow afternoon to see how she is doing on the Torsemide. Advised patient I would forward message over.

## 2022-09-19 ENCOUNTER — Encounter: Payer: Self-pay | Admitting: Cardiovascular Disease

## 2022-09-26 ENCOUNTER — Telehealth: Payer: Self-pay | Admitting: Cardiovascular Disease

## 2022-09-26 NOTE — Telephone Encounter (Signed)
Patient would like to speak to the nurse about her lab results.  She said she received a message via MyChart but she doesn't understand it. She would like a call back.

## 2022-09-26 NOTE — Telephone Encounter (Signed)
Spoke with patient and gave her Dr. Evette Georges lab results comments. "Creatinine stable at 1.07. CBC stable." She asked if there was anything she needed to do or change. Told her there are no changes at this time.

## 2022-10-01 ENCOUNTER — Ambulatory Visit: Payer: Medicare Other | Attending: Family Medicine | Admitting: Physical Therapy

## 2022-10-01 ENCOUNTER — Encounter: Payer: Self-pay | Admitting: Physical Therapy

## 2022-10-01 DIAGNOSIS — M546 Pain in thoracic spine: Secondary | ICD-10-CM

## 2022-10-01 DIAGNOSIS — M6281 Muscle weakness (generalized): Secondary | ICD-10-CM | POA: Insufficient documentation

## 2022-10-01 DIAGNOSIS — R293 Abnormal posture: Secondary | ICD-10-CM | POA: Insufficient documentation

## 2022-10-01 DIAGNOSIS — M545 Low back pain, unspecified: Secondary | ICD-10-CM | POA: Diagnosis not present

## 2022-10-01 DIAGNOSIS — M4125 Other idiopathic scoliosis, thoracolumbar region: Secondary | ICD-10-CM | POA: Insufficient documentation

## 2022-10-01 DIAGNOSIS — M542 Cervicalgia: Secondary | ICD-10-CM | POA: Insufficient documentation

## 2022-10-01 DIAGNOSIS — G8929 Other chronic pain: Secondary | ICD-10-CM | POA: Diagnosis not present

## 2022-10-01 NOTE — Therapy (Signed)
OUTPATIENT PHYSICAL THERAPY TREATMENT   Patient Name: Janet Nguyen MRN: [id number] DOB:09-06-1932, 89 y.o., female Today's Date: 10/01/2022   PT End of Session - 10/01/22 1144     Visit Number 14    Date for PT Re-Evaluation 10/23/22    Authorization Type Medicare Part A/B, KX at visit 15    Progress Note Due on Visit 85    PT Start Time 1145    PT Stop Time 1230    PT Time Calculation (min) 45 min    Activity Tolerance Patient tolerated treatment well;Patient limited by pain    Behavior During Therapy [facility] for tasks assessed/performed                 Past Medical History:  Diagnosis Date   Arrhythmia    History of SVT with documented PVC'S and  PAC'S  12/08/12 Nuc stress test normal LV EF 74%  Event Monitor  12/01/12-01/03/13   Atrial flutter ([place])    Celiac disease    treated by Dr. 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 Marijean Bravo; pt states she was diagnosed 10-15 years ago, not treated at this time or any issues that she is aware of.   Scoliosis    Scoliosis    Sleep apnea 10/02/11 [facility]   Sleep study AHI -total sleep 10.3/hr  64.0/ hr during REM sleep.RDI 22.8/hr during total sleep 64.0/hr during REM sleep The lowest O2 sat during Non-REM and REM sleep was 86% and 88% respectively. 04/08/12 CPAP/BIPAP titration study [facility]   Past Surgical History:  Procedure Laterality Date   APPENDECTOMY     ruptured at age 38 and had surgery   CARDIAC CATHETERIZATION  01/27/06   CARDIOVERSION N/A 01/08/2022   Procedure: CARDIOVERSION;  Surgeon: Freada Bergeron, MD;  Location: [facility] ENDOSCOPY;  Service: Cardiovascular;  Laterality: N/A;   cataract surgery  2015   Dr. Herbert Deaner; March & April 2015   Patient Active Problem List    Diagnosis Date Noted   Secondary hypercoagulable state ([place]) 12/12/2021   Hyponatremia 12/04/2021   Acute CHF (congestive heart failure) ([place]) 12/02/2021   Acute on chronic diastolic CHF (congestive heart failure) ([place]) 12/02/2021   Atrial fibrillation with rapid ventricular response ([place]) 12/02/2021   Essential hypertension 12/02/2021   GERD without esophagitis 12/02/2021   Obstructive sleep apnea 12/02/2021   Interstitial lung disease ([place]) 12/02/2021   History of pulmonary embolism 12/02/2021   Pulmonary embolism ([place]) 11/06/2021   Bronchiectasis ([place]) 11/06/2021   Deviated septum 05/08/2020   Mass of subcutaneous tissue 05/02/2020   Vertigo 06/16/2019   Pulmonary hypertension, unspecified ([place]) 04/14/2019   Ischemic colitis ([place]) 04/14/2019   Migraine with aura and without status migrainosus, not intractable 11/08/2018   Degeneration of lumbar intervertebral disc 10/14/2018   Hoarseness of voice 03/04/2018   Abdominal pain 07/01/2017   Diverticulitis, colon    Metabolic acidosis, increased anion gap    Constipation 04/14/2017   Lumbar hernia 04/14/2017   Presbycusis of both ears 01/10/2017   Tinnitus aurium, bilateral 01/10/2017   Gastroesophageal reflux disease 08/20/2016   Hemoptysis 08/20/2016   Obstructive sleep apnea of adult 08/20/2016   Rhinitis, chronic 08/20/2016   Throat pain in adult 08/20/2016  Fatigue 12/23/2015   Sciatica of right side 10/06/2015   History of migraine headaches 10/06/2015   Frequent PVCs 12/28/2013   Premature atrial contractions 12/28/2013   PSVT (paroxysmal supraventricular tachycardia) ([place]) 12/28/2013   Heart palpitations 07/13/2013   Sleep apnea 04/11/2013   Scoliosis 04/11/2013   Atypical atrial flutter ([place]) 11/29/2012   Chest pain, atypical 11/29/2012   Fibromyalgia syndrome 11/29/2012   Chronic steroid use 11/29/2012      REFERRING PROVIDER: Lawerance Cruel, MD   REFERRING DIAG: M54.9 (ICD-10-CM) - Back pain    Rationale for Evaluation and Treatment Rehabilitation  THERAPY DIAG:  Other idiopathic scoliosis, thoracolumbar region  Muscle weakness (generalized)  Pain in thoracic spine  Cervicalgia  Abnormal posture  ONSET DATE: chronic LBP  SUBJECTIVE:                                                                                                                                       The holidays were very depressing.  I need counseling.  SUBJECTIVE STATEMENT:  From evaluation: I have had a bad year.  I had PE, was hospitalized, have a swollen Rt leg, am now on Lasix, I'm in afib (cardioversion didn't stick).  Blood clot in leg was ruled out.  My leg swelling is much better than it was but still very swollen.  I am easily winded even when I'm talking.   I have lost 5 inches with my scoliosis.  I am so compressed and painful on my Lt side I have no room for my stomach so eating is very challenging.  I struggle to move my bowels.   I can only lay on my Rt side or my back.  Laying on my Rt side so much can start hurting my hip.   My neck and shoulders always hurt.   My activity level has diminished signif due to getting so easily winded.  I won't be able to do the NuStep given how winded I get.  I am trying to consistently get back into my stretches from last PT sessions.   PERTINENT HISTORY:  Pt has cardiac doctor, rheumatologist Complex medical history: of note October 25, 2021 was positive for PE, hospitalized on March 6 through December 04, 2021 with increased palpitations and was found to be in atrial fibrillation, cardioversion April 2023 was initially successful but not long term  Long history of pain from signif scoliosis, has been in/out of PT for this History of migraine headaches Sleep apnea without tolerance of CPAP, uses mouthguard  PAIN:  PAIN:  Are you having pain? Yes NPRS scale: 7-8/10 Pain location: neck, back, shoulders, Rt hip Pain orientation: Right and Bilateral  PAIN  TYPE: aching, throbbing, and tight Pain description: constant  Aggravating factors: activity, laying on Rt side Relieving factors: rest    PRECAUTIONS: None  WEIGHT BEARING RESTRICTIONS No  FALLS:  Has patient fallen in last 6  months? No  LIVING ENVIRONMENT: Lives with: lives alone Lives in: Other single level condo Stairs: No Has following equipment at home: None  OCCUPATION: retired  PLOF: Independent with household mobility without device and Independent with community mobility without device  PATIENT GOALS open up Lt side of trunk, work back into my stretches, less pain   OBJECTIVE:   DIAGNOSTIC FINDINGS:  No new imaging    SCREENING FOR RED FLAGS: Bowel or bladder incontinence: No Spinal tumors: No Cauda equina syndrome: No Compression fracture: No Abdominal aneurysm: No  COGNITION:  Overall cognitive status: Within functional limits for tasks assessed     SENSATION: WFL  MUSCLE LENGTH: End range bil hamstring limited Lt lat limits full Lt shoulder flexion  POSTURE:  severe scoliosis, concave on Lt with lower ribcage and pelvis approximation  PALPATION: 08/28/22: improved soft tissue and fascial mobility along Lt trunk related to short compressed side of scoliosis, ongoing rigidity of ribcage and spine; improved Rt LE edema control with compression stockings  Evaluation: Diffuse tenderness cervical, thoracic soft tissues, very tight Lt latissimus dorsi, bil pectorals, Lt obliques and QL, Rt hip Non-pitting edema in Rt leg and foot present - under care of MD and DVT ruled out per chart review, on Lasix  CERVICAL ROM:   Active  A/PROM  eval A/ROM 08/28/22  Flexion 57 65  Extension 35 35  Right lateral flexion 12 25  Left lateral flexion 15 25  Right rotation 40 35  Left rotation 40 35   (Blank rows = not tested)  LUMBAR ROM:   Active  A/PROM  eval A/ROM 11/30  Flexion Fingers to mid shin Fingers to ankles  Extension Not tested Not  tested  Right lateral flexion 5 8  Left lateral flexion 3 5  Right rotation 20% 50%  Left rotation 20% 35%   (Blank rows = not tested)  LOWER EXTREMITY ROM:     Passive  Right eval Left eval Right 11/30 Left 11/30  Hip flexion      Hip extension      Hip abduction      Hip adduction      Hip internal rotation 35 40 35 45  Hip external rotation 50 60 60 65  Knee flexion      Knee extension      Ankle dorsiflexion      Ankle plantarflexion      Ankle inversion      Ankle eversion       (Blank rows = not tested)  LOWER EXTREMITY MMT:     11/30: 4/5 bil LE Eval: 4/5 bil LE   GAIT: Distance walked: within clinic Assistive device utilized: None Level of assistance: Complete Independence Comments: gets very winded    TODAY'S TREATMENT  Date: 10/01/22 NuStep L2 x 4' PT present to discuss status Rt SL: Lt trunk STM for elongation along obliques, lats, paraspinals, QL Seated STM bil upper quadrants, thoracic paraspinals, pectorals Pt education: focus on the things you can control and look for small action plans for more engagement with community [employer], Theme park manager, Social worker, get hair cut)  Date: 09/17/22 Seated STM bil upper quadrants, thoracic paraspinals, pectorals Rt SL: Lt trunk STM for elongation along obliques, lats, paraspinals, QL NuStep L1 x 3' PT present to monitor tolerance  Date: 09/10/22 Seated STM bil upper quadrants, thoracic paraspinals, pectorals Seated thoracic extension with pec stretch, passive by PT over back of chair Rt SL: Lt trunk STM for elongation along obliques, lats, paraspinals, QL  Supine SKTC alt LE x20, hamstring stretch 3x10"  PATIENT EDUCATION:  Education details: discussed plan of care and goals Person educated: Patient Education method: Explanation Education comprehension: verbalized understanding   HOME EXERCISE PROGRAM: Access Code: [email or web address] URL: [email or web address] Date: 08/07/2022 Prepared by: Venetia Night  Onetha Gaffey  Exercises - Standing Row with Anchored Resistance  - 1 x daily - 7 x weekly - 2 sets - 10 reps - Standing Shoulder Extension with Resistance  - 1 x daily - 7 x weekly - 2 sets - 10 reps - Standing Shoulder External Rotation with Resistance  - 1 x daily - 7 x weekly - 2 sets - 10 reps - Standing Shoulder Horizontal Abduction with Resistance  - 1 x daily - 7 x weekly - 2 sets - 10 reps  ASSESSMENT:  CLINICAL IMPRESSION: Pt continues to feel isolated which was emphasized by the holiday season.  PT discussed focusing on the things she can control and look for small ways to make changes to engage with her community.  She continues to have diffuse pain related to degenerative severe scoliosis which benefits from manual therapy.  She is demonstrating improved upright posture and functional mobility within her curve.  Her endurance continues to be very limited.     OBJECTIVE IMPAIRMENTS decreased activity tolerance, decreased endurance, decreased mobility, decreased ROM, decreased strength, hypomobility, increased fascial restrictions, increased muscle spasms, impaired flexibility, impaired UE functional use, improper body mechanics, postural dysfunction, and pain.   ACTIVITY LIMITATIONS carrying, lifting, bending, standing, sleeping, transfers, and reach over head  PARTICIPATION LIMITATIONS: cleaning, laundry, driving, shopping, community activity, and yard work  PERSONAL FACTORS Age and Time since onset of injury/illness/exacerbation are also affecting patient's functional outcome.   REHAB POTENTIAL: Good  CLINICAL DECISION MAKING: Stable/uncomplicated  EVALUATION COMPLEXITY: Low   GOALS: Goals reviewed with patient? Yes  SHORT TERM GOALS: Target date: 07/31/22  Pt will be ind with initial HEP for trunk and LE mobility and stretching Baseline: Goal status: met    LONG TERM GOALS: Target date: 08/28/22  Pt will be ind with strategies for symptom management including  pacing, spinal decompression, positioning, and HEP. Baseline:  Goal status: met  2.  Pt will improve bil hamstring length to WNL to reduce back pain Baseline:  Goal status: ongoing  3.  Pt will be able to perform light daily tasks within home for up to 30 min before needing a positional rest break. Baseline:  Goal status: met for some days of the week, ongoing  4.  Pt will achieve improved soft tissue mobility of Lt side of trunk and bil cervical region to reduce pain and decompress abdominal organs Baseline:  Goal status: met  5.  Improve neck rotation to at least 50 deg for improved visibility with driving. Baseline: 40 deg bil Goal status: ongoing, regressed to 35 deg on 11/30     PLAN: PT FREQUENCY: taper to 1x/week  PT DURATION: 8 weeks  PLANNED INTERVENTIONS: Therapeutic exercises, Therapeutic activity, Neuromuscular re-education, Balance training, Patient/Family education, Self Care, Joint mobilization, Aquatic Therapy, Dry Needling, Electrical stimulation, Spinal mobilization, Cryotherapy, Moist heat, and Manual therapy.  PLAN FOR NEXT SESSION: NuStep, work towards consistent routine for HEP, conitnue manual therapy for ease of motion and release of tissues  Ferguson Gertner, PT 10/01/22 1:28 PM

## 2022-10-08 ENCOUNTER — Ambulatory Visit: Payer: Medicare Other | Admitting: Physical Therapy

## 2022-10-08 ENCOUNTER — Encounter: Payer: Self-pay | Admitting: Physical Therapy

## 2022-10-08 DIAGNOSIS — M542 Cervicalgia: Secondary | ICD-10-CM

## 2022-10-08 DIAGNOSIS — M546 Pain in thoracic spine: Secondary | ICD-10-CM

## 2022-10-08 DIAGNOSIS — G8929 Other chronic pain: Secondary | ICD-10-CM

## 2022-10-08 DIAGNOSIS — M4125 Other idiopathic scoliosis, thoracolumbar region: Secondary | ICD-10-CM

## 2022-10-08 DIAGNOSIS — R293 Abnormal posture: Secondary | ICD-10-CM | POA: Diagnosis not present

## 2022-10-08 DIAGNOSIS — M545 Low back pain, unspecified: Secondary | ICD-10-CM

## 2022-10-08 DIAGNOSIS — M6281 Muscle weakness (generalized): Secondary | ICD-10-CM | POA: Diagnosis not present

## 2022-10-08 NOTE — Therapy (Signed)
OUTPATIENT PHYSICAL THERAPY TREATMENT   Patient Name: Janet Nguyen MRN: [id number] DOB:06/01/1932, 89 y.o., female Today's Date: 10/08/2022   PT End of Session - 10/08/22 1145     Visit Number 15    Date for PT Re-Evaluation 10/23/22    Authorization Type Medicare Part A/B    Progress Note Due on Visit 20    PT Start Time 1145    PT Stop Time 1230    PT Time Calculation (min) 45 min    Activity Tolerance Patient tolerated treatment well;Patient limited by pain    Behavior During Therapy [facility] for tasks assessed/performed                  Past Medical History:  Diagnosis Date   Arrhythmia    History of SVT with documented PVC'S and  PAC'S  12/08/12 Nuc stress test normal LV EF 74%  Event Monitor  12/01/12-01/03/13   Atrial flutter (HCC)    Celiac disease    treated by Dr. 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 Marijean Bravo; pt states she was diagnosed 10-15 years ago, not treated at this time or any issues that she is aware of.   Scoliosis    Scoliosis    Sleep apnea 10/02/11 [facility]   Sleep study AHI -total sleep 10.3/hr  64.0/ hr during REM sleep.RDI 22.8/hr during total sleep 64.0/hr during REM sleep The lowest O2 sat during Non-REM and REM sleep was 86% and 88% respectively. 04/08/12 CPAP/BIPAP titration study [facility]   Past Surgical History:  Procedure Laterality Date   APPENDECTOMY     ruptured at age 49 and had surgery   CARDIAC CATHETERIZATION  01/27/06   CARDIOVERSION N/A 01/08/2022   Procedure: CARDIOVERSION;  Surgeon: Freada Bergeron, MD;  Location: [facility] ENDOSCOPY;  Service: Cardiovascular;  Laterality: N/A;   cataract surgery  2015   Dr. Herbert Deaner; March & April 2015   Patient Active Problem List   Diagnosis  Date Noted   Secondary hypercoagulable state ([place]) 12/12/2021   Hyponatremia 12/04/2021   Acute CHF (congestive heart failure) ([place]) 12/02/2021   Acute on chronic diastolic CHF (congestive heart failure) ([place]) 12/02/2021   Atrial fibrillation with rapid ventricular response ([place]) 12/02/2021   Essential hypertension 12/02/2021   GERD without esophagitis 12/02/2021   Obstructive sleep apnea 12/02/2021   Interstitial lung disease ([place]) 12/02/2021   History of pulmonary embolism 12/02/2021   Pulmonary embolism ([place]) 11/06/2021   Bronchiectasis ([place]) 11/06/2021   Deviated septum 05/08/2020   Mass of subcutaneous tissue 05/02/2020   Vertigo 06/16/2019   Pulmonary hypertension, unspecified ([place]) 04/14/2019   Ischemic colitis ([place]) 04/14/2019   Migraine with aura and without status migrainosus, not intractable 11/08/2018   Degeneration of lumbar intervertebral disc 10/14/2018   Hoarseness of voice 03/04/2018   Abdominal pain 07/01/2017   Diverticulitis, colon    Metabolic acidosis, increased anion gap    Constipation 04/14/2017   Lumbar hernia 04/14/2017   Presbycusis of both ears 01/10/2017   Tinnitus aurium, bilateral 01/10/2017   Gastroesophageal reflux disease 08/20/2016   Hemoptysis 08/20/2016   Obstructive sleep apnea of adult 08/20/2016   Rhinitis, chronic 08/20/2016   Throat pain in adult 08/20/2016   Fatigue 12/23/2015  Sciatica of right side 10/06/2015   History of migraine headaches 10/06/2015   Frequent PVCs 12/28/2013   Premature atrial contractions 12/28/2013   PSVT (paroxysmal supraventricular tachycardia) ([place]) 12/28/2013   Heart palpitations 07/13/2013   Sleep apnea 04/11/2013   Scoliosis 04/11/2013   Atypical atrial flutter ([place]) 11/29/2012   Chest pain, atypical 11/29/2012   Fibromyalgia syndrome 11/29/2012   Chronic steroid use 11/29/2012      REFERRING PROVIDER: Lawerance Cruel, MD   REFERRING DIAG: M54.9 (ICD-10-CM) - Back pain   Rationale for  Evaluation and Treatment Rehabilitation  THERAPY DIAG:  Other idiopathic scoliosis, thoracolumbar region  Muscle weakness (generalized)  Cervicalgia  Pain in thoracic spine  Abnormal posture  Chronic bilateral low back pain without sciatica  ONSET DATE: chronic LBP  SUBJECTIVE:                                                                                                                                       I hosted [place] yesterday.  I cleaned before they came over and am paying for it today.  SUBJECTIVE STATEMENT:  From evaluation: I have had a bad year.  I had PE, was hospitalized, have a swollen Rt leg, am now on Lasix, I'm in afib (cardioversion didn't stick).  Blood clot in leg was ruled out.  My leg swelling is much better than it was but still very swollen.  I am easily winded even when I'm talking.   I have lost 5 inches with my scoliosis.  I am so compressed and painful on my Lt side I have no room for my stomach so eating is very challenging.  I struggle to move my bowels.   I can only lay on my Rt side or my back.  Laying on my Rt side so much can start hurting my hip.   My neck and shoulders always hurt.   My activity level has diminished signif due to getting so easily winded.  I won't be able to do the NuStep given how winded I get.  I am trying to consistently get back into my stretches from last PT sessions.   PERTINENT HISTORY:  Pt has cardiac doctor, rheumatologist Complex medical history: of note October 25, 2021 was positive for PE, hospitalized on March 6 through December 04, 2021 with increased palpitations and was found to be in atrial fibrillation, cardioversion April 2023 was initially successful but not long term  Long history of pain from signif scoliosis, has been in/out of PT for this History of migraine headaches Sleep apnea without tolerance of CPAP, uses mouthguard  PAIN:  PAIN:  Are you having pain? Yes NPRS scale: 8/10 Pain location: neck,  back, shoulders, Rt hip Pain orientation: Right and Bilateral  PAIN TYPE: aching, throbbing, and tight Pain description: constant  Aggravating factors: activity, laying on Rt side Relieving factors: rest    PRECAUTIONS: None  WEIGHT  BEARING RESTRICTIONS No  FALLS:  Has patient fallen in last 6 months? No  LIVING ENVIRONMENT: Lives with: lives alone Lives in: Other single level condo Stairs: No Has following equipment at home: None  OCCUPATION: retired  PLOF: Independent with household mobility without device and Independent with community mobility without device  PATIENT GOALS open up Lt side of trunk, work back into my stretches, less pain   OBJECTIVE:   DIAGNOSTIC FINDINGS:  No new imaging    SCREENING FOR RED FLAGS: Bowel or bladder incontinence: No Spinal tumors: No Cauda equina syndrome: No Compression fracture: No Abdominal aneurysm: No  COGNITION:  Overall cognitive status: Within functional limits for tasks assessed     SENSATION: WFL  MUSCLE LENGTH: End range bil hamstring limited Lt lat limits full Lt shoulder flexion  POSTURE:  severe scoliosis, concave on Lt with lower ribcage and pelvis approximation  PALPATION: 08/28/22: improved soft tissue and fascial mobility along Lt trunk related to short compressed side of scoliosis, ongoing rigidity of ribcage and spine; improved Rt LE edema control with compression stockings  Evaluation: Diffuse tenderness cervical, thoracic soft tissues, very tight Lt latissimus dorsi, bil pectorals, Lt obliques and QL, Rt hip Non-pitting edema in Rt leg and foot present - under care of MD and DVT ruled out per chart review, on Lasix  CERVICAL ROM:   Active  A/PROM  eval A/ROM 08/28/22  Flexion 57 65  Extension 35 35  Right lateral flexion 12 25  Left lateral flexion 15 25  Right rotation 40 35  Left rotation 40 35   (Blank rows = not tested)  LUMBAR ROM:   Active  A/PROM  eval A/ROM 11/30  Flexion  Fingers to mid shin Fingers to ankles  Extension Not tested Not tested  Right lateral flexion 5 8  Left lateral flexion 3 5  Right rotation 20% 50%  Left rotation 20% 35%   (Blank rows = not tested)  LOWER EXTREMITY ROM:     Passive  Right eval Left eval Right 11/30 Left 11/30  Hip flexion      Hip extension      Hip abduction      Hip adduction      Hip internal rotation 35 40 35 45  Hip external rotation 50 60 60 65  Knee flexion      Knee extension      Ankle dorsiflexion      Ankle plantarflexion      Ankle inversion      Ankle eversion       (Blank rows = not tested)  LOWER EXTREMITY MMT:     11/30: 4/5 bil LE Eval: 4/5 bil LE   GAIT: Distance walked: within clinic Assistive device utilized: None Level of assistance: Complete Independence Comments: gets very winded    TODAY'S TREATMENT  Date: 10/08/22 NuStep L2 x 5' PT present to discuss status Standing red band row x20 Doorway Lt UE reach up and over stretch 3x10" Rt SL: Lt trunk STM for elongation along obliques, lats, paraspinals, QL Seated STM bil upper quadrants, thoracic paraspinals, pectorals  Date: 10/01/22 NuStep L2 x 4' PT present to discuss status Rt SL: Lt trunk STM for elongation along obliques, lats, paraspinals, QL Seated STM bil upper quadrants, thoracic paraspinals, pectorals Pt education: focus on the things you can control and look for small action plans for more engagement with community [employer], Theme park manager, counselor, get hair cut)  Date: 09/17/22 Seated STM bil upper quadrants, thoracic paraspinals,  pectorals Rt SL: Lt trunk STM for elongation along obliques, lats, paraspinals, QL NuStep L1 x 3' PT present to monitor tolerance  PATIENT EDUCATION:  Education details: discussed plan of care and goals Person educated: Patient Education method: Explanation Education comprehension: verbalized understanding   HOME EXERCISE PROGRAM: Access Code: [email or web address] URL:  [email or web address] Date: 08/07/2022 Prepared by: [place]ia Night Hairo Garraway  Exercises - Standing Row with Anchored Resistance  - 1 x daily - 7 x weekly - 2 sets - 10 reps - Standing Shoulder Extension with Resistance  - 1 x daily - 7 x weekly - 2 sets - 10 reps - Standing Shoulder External Rotation with Resistance  - 1 x daily - 7 x weekly - 2 sets - 10 reps - Standing Shoulder Horizontal Abduction with Resistance  - 1 x daily - 7 x weekly - 2 sets - 10 reps  ASSESSMENT:  CLINICAL IMPRESSION: Pt is participating in more social events which are taxing on her body but good for her mind and spirit.  She demos improved cardiovascular stamina with ability to hold conversation with 5' NuStep warm up today.  Improved soft tissue mobility of trunk is noted today with most tension in Lt QL and bil upper traps.  She continues to benefit from skilled PT with manual techniques for elongation along spinal curvature.     OBJECTIVE IMPAIRMENTS decreased activity tolerance, decreased endurance, decreased mobility, decreased ROM, decreased strength, hypomobility, increased fascial restrictions, increased muscle spasms, impaired flexibility, impaired UE functional use, improper body mechanics, postural dysfunction, and pain.   ACTIVITY LIMITATIONS carrying, lifting, bending, standing, sleeping, transfers, and reach over head  PARTICIPATION LIMITATIONS: cleaning, laundry, driving, shopping, community activity, and yard work  PERSONAL FACTORS Age and Time since onset of injury/illness/exacerbation are also affecting patient's functional outcome.   REHAB POTENTIAL: Good  CLINICAL DECISION MAKING: Stable/uncomplicated  EVALUATION COMPLEXITY: Low   GOALS: Goals reviewed with patient? Yes  SHORT TERM GOALS: Target date: 07/31/22  Pt will be ind with initial HEP for trunk and LE mobility and stretching Baseline: Goal status: met    LONG TERM GOALS: Target date: 08/28/22  Pt will be ind with  strategies for symptom management including pacing, spinal decompression, positioning, and HEP. Baseline:  Goal status: met  2.  Pt will improve bil hamstring length to WNL to reduce back pain Baseline:  Goal status: ongoing  3.  Pt will be able to perform light daily tasks within home for up to 30 min before needing a positional rest break. Baseline:  Goal status: met for some days of the week, ongoing  4.  Pt will achieve improved soft tissue mobility of Lt side of trunk and bil cervical region to reduce pain and decompress abdominal organs Baseline:  Goal status: met  5.  Improve neck rotation to at least 50 deg for improved visibility with driving. Baseline: 40 deg bil Goal status: ongoing, regressed to 35 deg on 11/30     PLAN: PT FREQUENCY: taper to 1x/week  PT DURATION: 8 weeks  PLANNED INTERVENTIONS: Therapeutic exercises, Therapeutic activity, Neuromuscular re-education, Balance training, Patient/Family education, Self Care, Joint mobilization, Aquatic Therapy, Dry Needling, Electrical stimulation, Spinal mobilization, Cryotherapy, Moist heat, and Manual therapy.  PLAN FOR NEXT SESSION: NuStep, work towards consistent routine for HEP, conitnue manual therapy for ease of motion and release of tissues  Sallye Lunz, PT 10/08/22 1:40 PM

## 2022-10-15 ENCOUNTER — Ambulatory Visit: Payer: Medicare Other | Admitting: Physical Therapy

## 2022-10-15 ENCOUNTER — Encounter: Payer: Self-pay | Admitting: Physical Therapy

## 2022-10-15 DIAGNOSIS — M542 Cervicalgia: Secondary | ICD-10-CM

## 2022-10-15 DIAGNOSIS — R293 Abnormal posture: Secondary | ICD-10-CM | POA: Diagnosis not present

## 2022-10-15 DIAGNOSIS — M6281 Muscle weakness (generalized): Secondary | ICD-10-CM | POA: Diagnosis not present

## 2022-10-15 DIAGNOSIS — M545 Low back pain, unspecified: Secondary | ICD-10-CM | POA: Diagnosis not present

## 2022-10-15 DIAGNOSIS — M546 Pain in thoracic spine: Secondary | ICD-10-CM

## 2022-10-15 DIAGNOSIS — G8929 Other chronic pain: Secondary | ICD-10-CM

## 2022-10-15 DIAGNOSIS — M4125 Other idiopathic scoliosis, thoracolumbar region: Secondary | ICD-10-CM | POA: Diagnosis not present

## 2022-10-15 NOTE — Therapy (Signed)
OUTPATIENT PHYSICAL THERAPY TREATMENT   Patient Name: Janet Nguyen MRN: [id number] DOB:05-12-1932, 89 y.o., female Today's Date: 10/15/2022   PT End of Session - 10/15/22 1103     Visit Number 16    Date for PT Re-Evaluation 10/23/22    Authorization Type Medicare Part A/B    Progress Note Due on Visit 20    PT Start Time 1100    PT Stop Time 1145    PT Time Calculation (min) 45 min    Activity Tolerance Patient tolerated treatment well;Patient limited by pain    Behavior During Therapy [facility] for tasks assessed/performed                   Past Medical History:  Diagnosis Date   Arrhythmia    History of SVT with documented PVC'S and  PAC'S  12/08/12 Nuc stress test normal LV EF 74%  Event Monitor  12/01/12-01/03/13   Atrial flutter (HCC)    Celiac disease    treated by Dr. 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 Marijean Bravo; pt states she was diagnosed 10-15 years ago, not treated at this time or any issues that she is aware of.   Scoliosis    Scoliosis    Sleep apnea 10/02/11 [facility]   Sleep study AHI -total sleep 10.3/hr  64.0/ hr during REM sleep.RDI 22.8/hr during total sleep 64.0/hr during REM sleep The lowest O2 sat during Non-REM and REM sleep was 86% and 88% respectively. 04/08/12 CPAP/BIPAP titration study [facility]   Past Surgical History:  Procedure Laterality Date   APPENDECTOMY     ruptured at age 26 and had surgery   CARDIAC CATHETERIZATION  01/27/06   CARDIOVERSION N/A 01/08/2022   Procedure: CARDIOVERSION;  Surgeon: Freada Bergeron, MD;  Location: [facility] ENDOSCOPY;  Service: Cardiovascular;  Laterality: N/A;   cataract surgery  2015   Dr. Herbert Deaner; March & April 2015   Patient Active Problem List    Diagnosis Date Noted   Secondary hypercoagulable state ([place]) 12/12/2021   Hyponatremia 12/04/2021   Acute CHF (congestive heart failure) ([place]) 12/02/2021   Acute on chronic diastolic CHF (congestive heart failure) ([place]) 12/02/2021   Atrial fibrillation with rapid ventricular response ([place]) 12/02/2021   Essential hypertension 12/02/2021   GERD without esophagitis 12/02/2021   Obstructive sleep apnea 12/02/2021   Interstitial lung disease ([place]) 12/02/2021   History of pulmonary embolism 12/02/2021   Pulmonary embolism ([place]) 11/06/2021   Bronchiectasis ([place]) 11/06/2021   Deviated septum 05/08/2020   Mass of subcutaneous tissue 05/02/2020   Vertigo 06/16/2019   Pulmonary hypertension, unspecified ([place]) 04/14/2019   Ischemic colitis ([place]) 04/14/2019   Migraine with aura and without status migrainosus, not intractable 11/08/2018   Degeneration of lumbar intervertebral disc 10/14/2018   Hoarseness of voice 03/04/2018   Abdominal pain 07/01/2017   Diverticulitis, colon    Metabolic acidosis, increased anion gap    Constipation 04/14/2017   Lumbar hernia 04/14/2017   Presbycusis of both ears 01/10/2017   Tinnitus aurium, bilateral 01/10/2017   Gastroesophageal reflux disease 08/20/2016   Hemoptysis 08/20/2016   Obstructive sleep apnea of adult 08/20/2016   Rhinitis, chronic 08/20/2016   Throat pain in adult 08/20/2016   Fatigue 12/23/2015  Sciatica of right side 10/06/2015   History of migraine headaches 10/06/2015   Frequent PVCs 12/28/2013   Premature atrial contractions 12/28/2013   PSVT (paroxysmal supraventricular tachycardia) ([place]) 12/28/2013   Heart palpitations 07/13/2013   Sleep apnea 04/11/2013   Scoliosis 04/11/2013   Atypical atrial flutter ([place]) 11/29/2012   Chest pain, atypical 11/29/2012   Fibromyalgia syndrome 11/29/2012   Chronic steroid use 11/29/2012      REFERRING PROVIDER: Lawerance Cruel, MD   REFERRING DIAG: M54.9 (ICD-10-CM) - Back pain    Rationale for Evaluation and Treatment Rehabilitation  THERAPY DIAG:  Other idiopathic scoliosis, thoracolumbar region  Muscle weakness (generalized)  Cervicalgia  Pain in thoracic spine  Abnormal posture  Chronic bilateral low back pain without sciatica  ONSET DATE: chronic LBP  SUBJECTIVE:                                                                                                                                       I played Mahjong again yesterday with a group of friends.  It felt so good to feel well enough to go play.  My leg and ankle aren't as swollen.  SUBJECTIVE STATEMENT:  From evaluation: I have had a bad year.  I had PE, was hospitalized, have a swollen Rt leg, am now on Lasix, I'm in afib (cardioversion didn't stick).  Blood clot in leg was ruled out.  My leg swelling is much better than it was but still very swollen.  I am easily winded even when I'm talking.   I have lost 5 inches with my scoliosis.  I am so compressed and painful on my Lt side I have no room for my stomach so eating is very challenging.  I struggle to move my bowels.   I can only lay on my Rt side or my back.  Laying on my Rt side so much can start hurting my hip.   My neck and shoulders always hurt.   My activity level has diminished signif due to getting so easily winded.  I won't be able to do the NuStep given how winded I get.  I am trying to consistently get back into my stretches from last PT sessions.   PERTINENT HISTORY:  Pt has cardiac doctor, rheumatologist Complex medical history: of note October 25, 2021 was positive for PE, hospitalized on March 6 through December 04, 2021 with increased palpitations and was found to be in atrial fibrillation, cardioversion April 2023 was initially successful but not long term  Long history of pain from signif scoliosis, has been in/out of PT for this History of migraine headaches Sleep apnea without tolerance of CPAP, uses mouthguard  PAIN:   PAIN:  Are you having pain? Yes NPRS scale: 5/10 Pain location: neck, back, shoulders, Rt hip Pain orientation: Right and Bilateral  PAIN TYPE: aching, throbbing, and tight Pain description: constant  Aggravating factors: activity, laying  on Rt side Relieving factors: rest    PRECAUTIONS: None  WEIGHT BEARING RESTRICTIONS No  FALLS:  Has patient fallen in last 6 months? No  LIVING ENVIRONMENT: Lives with: lives alone Lives in: Other single level condo Stairs: No Has following equipment at home: None  OCCUPATION: retired  PLOF: Independent with household mobility without device and Independent with community mobility without device  PATIENT GOALS open up Lt side of trunk, work back into my stretches, less pain   OBJECTIVE:   DIAGNOSTIC FINDINGS:  No new imaging    SCREENING FOR RED FLAGS: Bowel or bladder incontinence: No Spinal tumors: No Cauda equina syndrome: No Compression fracture: No Abdominal aneurysm: No  COGNITION:  Overall cognitive status: Within functional limits for tasks assessed     SENSATION: WFL  MUSCLE LENGTH: End range bil hamstring limited Lt lat limits full Lt shoulder flexion  POSTURE:  severe scoliosis, concave on Lt with lower ribcage and pelvis approximation  PALPATION: 08/28/22: improved soft tissue and fascial mobility along Lt trunk related to short compressed side of scoliosis, ongoing rigidity of ribcage and spine; improved Rt LE edema control with compression stockings  Evaluation: Diffuse tenderness cervical, thoracic soft tissues, very tight Lt latissimus dorsi, bil pectorals, Lt obliques and QL, Rt hip Non-pitting edema in Rt leg and foot present - under care of MD and DVT ruled out per chart review, on Lasix  CERVICAL ROM:   Active  A/PROM  eval A/ROM 08/28/22  Flexion 57 65  Extension 35 35  Right lateral flexion 12 25  Left lateral flexion 15 25  Right rotation 40 35  Left rotation 40 35   (Blank rows =  not tested)  LUMBAR ROM:   Active  A/PROM  eval A/ROM 11/30  Flexion Fingers to mid shin Fingers to ankles  Extension Not tested Not tested  Right lateral flexion 5 8  Left lateral flexion 3 5  Right rotation 20% 50%  Left rotation 20% 35%   (Blank rows = not tested)  LOWER EXTREMITY ROM:     Passive  Right eval Left eval Right 11/30 Left 11/30  Hip flexion      Hip extension      Hip abduction      Hip adduction      Hip internal rotation 35 40 35 45  Hip external rotation 50 60 60 65  Knee flexion      Knee extension      Ankle dorsiflexion      Ankle plantarflexion      Ankle inversion      Ankle eversion       (Blank rows = not tested)  LOWER EXTREMITY MMT:     11/30: 4/5 bil LE Eval: 4/5 bil LE   GAIT: Distance walked: within clinic Assistive device utilized: None Level of assistance: Complete Independence Comments: gets very winded    TODAY'S TREATMENT  Date: 10/15/22 NuStep L2 x 5' PT present to discuss status Doorway Lt UE reach up and over stretch 3x10" Pec doorway stretch x30" Sit to stand with yellow band horiz abd on stand x 10 reps Rt SL paraspinal broadening pelvis to shoulders bil, Lt obliques and QL Seated upper traps and cervical paraspinal STM bil Cervical A/ROM rotation bil 5x10" holds  Date: 10/08/22 NuStep L2 x 5' PT present to discuss status Standing red band row x20 Doorway Lt UE reach up and over stretch 3x10" Rt SL: Lt trunk STM for elongation along obliques, lats, paraspinals,  QL Seated STM bil upper quadrants, thoracic paraspinals, pectorals  Date: 10/01/22 NuStep L2 x 4' PT present to discuss status Rt SL: Lt trunk STM for elongation along obliques, lats, paraspinals, QL Seated STM bil upper quadrants, thoracic paraspinals, pectorals Pt education: focus on the things you can control and look for small action plans for more engagement with community [employer], Theme park manager, Social worker, get hair cut)   PATIENT EDUCATION:   Education details: discussed plan of care and goals Person educated: Patient Education method: Explanation Education comprehension: verbalized understanding   HOME EXERCISE PROGRAM: Access Code: [email or web address] URL: [email or web address] Date: 08/07/2022 Prepared by: Venetia Night Tawanna Funk  Exercises - Standing Row with Anchored Resistance  - 1 x daily - 7 x weekly - 2 sets - 10 reps - Standing Shoulder Extension with Resistance  - 1 x daily - 7 x weekly - 2 sets - 10 reps - Standing Shoulder External Rotation with Resistance  - 1 x daily - 7 x weekly - 2 sets - 10 reps - Standing Shoulder Horizontal Abduction with Resistance  - 1 x daily - 7 x weekly - 2 sets - 10 reps  ASSESSMENT:  CLINICAL IMPRESSION: Pt is participating in more social events which are taxing on her body but good for her mind and spirit.  She is pleased to feel well enough to participate in more social engagements.  Pain has reduced to 5/10 today although can still vary.  Pt is slowly demonstrating improved cardiovascular endurance on NuStep and within therex.  She has difficulty maintaining conversation on NuStep beyond 5' but this is an improvement given last year's medical challenges.  She will continue to benefit from skilled PT to maximize her return to community activity tolerance.   OBJECTIVE IMPAIRMENTS decreased activity tolerance, decreased endurance, decreased mobility, decreased ROM, decreased strength, hypomobility, increased fascial restrictions, increased muscle spasms, impaired flexibility, impaired UE functional use, improper body mechanics, postural dysfunction, and pain.   ACTIVITY LIMITATIONS carrying, lifting, bending, standing, sleeping, transfers, and reach over head  PARTICIPATION LIMITATIONS: cleaning, laundry, driving, shopping, community activity, and yard work  PERSONAL FACTORS Age and Time since onset of injury/illness/exacerbation are also affecting patient's functional outcome.    REHAB POTENTIAL: Good  CLINICAL DECISION MAKING: Stable/uncomplicated  EVALUATION COMPLEXITY: Low   GOALS: Goals reviewed with patient? Yes  SHORT TERM GOALS: Target date: 07/31/22  Pt will be ind with initial HEP for trunk and LE mobility and stretching Baseline: Goal status: met    LONG TERM GOALS: Target date: 08/28/22  Pt will be ind with strategies for symptom management including pacing, spinal decompression, positioning, and HEP. Baseline:  Goal status: met  2.  Pt will improve bil hamstring length to WNL to reduce back pain Baseline:  Goal status: ongoing  3.  Pt will be able to perform light daily tasks within home for up to 30 min before needing a positional rest break. Baseline:  Goal status: met for some days of the week, ongoing  4.  Pt will achieve improved soft tissue mobility of Lt side of trunk and bil cervical region to reduce pain and decompress abdominal organs Baseline:  Goal status: met  5.  Improve neck rotation to at least 50 deg for improved visibility with driving. Baseline: 40 deg bil Goal status: ongoing, regressed to 35 deg on 11/30     PLAN: PT FREQUENCY: taper to 1x/week  PT DURATION: 8 weeks  PLANNED INTERVENTIONS: Therapeutic exercises, Therapeutic activity, Neuromuscular re-education, Balance training, Patient/Family education, Self Care, Joint  mobilization, Aquatic Therapy, Dry Needling, Electrical stimulation, Spinal mobilization, Cryotherapy, Moist heat, and Manual therapy.  PLAN FOR NEXT SESSION: NuStep, work towards consistent routine for HEP, conitnue manual therapy for ease of motion and release of tissues  Aarilyn Dye, PT 10/15/22 12:09 PM

## 2022-10-18 ENCOUNTER — Other Ambulatory Visit: Payer: Self-pay | Admitting: Cardiovascular Disease

## 2022-10-22 ENCOUNTER — Ambulatory Visit: Payer: Medicare Other | Admitting: Physical Therapy

## 2022-10-22 ENCOUNTER — Encounter: Payer: Self-pay | Admitting: Physical Therapy

## 2022-10-22 DIAGNOSIS — R293 Abnormal posture: Secondary | ICD-10-CM

## 2022-10-22 DIAGNOSIS — M4125 Other idiopathic scoliosis, thoracolumbar region: Secondary | ICD-10-CM

## 2022-10-22 DIAGNOSIS — M6281 Muscle weakness (generalized): Secondary | ICD-10-CM | POA: Diagnosis not present

## 2022-10-22 DIAGNOSIS — M542 Cervicalgia: Secondary | ICD-10-CM

## 2022-10-22 DIAGNOSIS — M546 Pain in thoracic spine: Secondary | ICD-10-CM | POA: Diagnosis not present

## 2022-10-22 DIAGNOSIS — M545 Low back pain, unspecified: Secondary | ICD-10-CM | POA: Diagnosis not present

## 2022-10-22 NOTE — Therapy (Signed)
OUTPATIENT PHYSICAL THERAPY TREATMENT   Patient Name: Janet Nguyen MRN: [id number] DOB:[birth year], 89 y.o., female Today's Date: 10/22/2022   PT End of Session - 10/22/22 1104     Visit Number 17    Date for PT Re-Evaluation 12/03/22    Authorization Type Medicare Part A/B    Progress Note Due on Visit 20    PT Start Time 1100    PT Stop Time 1140    PT Time Calculation (min) 40 min    Activity Tolerance Patient tolerated treatment well;Patient limited by pain    Behavior During Therapy [facility] for tasks assessed/performed                    Past Medical History:  Diagnosis Date   Arrhythmia    History of SVT with documented PVC'S and  PAC'S  12/08/12 Nuc stress test normal LV EF 74%  Event Monitor  12/01/12-01/03/13   Atrial flutter (HCC)    Celiac disease    treated by Dr. 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 Marijean Bravo; pt states she was diagnosed 10-15 years ago, not treated at this time or any issues that she is aware of.   Scoliosis    Scoliosis    Sleep apnea 10/02/11 [facility]   Sleep study AHI -total sleep 10.3/hr  64.0/ hr during REM sleep.RDI 22.8/hr during total sleep 64.0/hr during REM sleep The lowest O2 sat during Non-REM and REM sleep was 86% and 88% respectively. 04/08/12 CPAP/BIPAP titration study  [facility]   Past Surgical History:  Procedure Laterality Date   APPENDECTOMY     ruptured at age 64 and had surgery   CARDIAC CATHETERIZATION  01/27/06   CARDIOVERSION N/A 01/08/2022   Procedure: CARDIOVERSION;  Surgeon: Freada Bergeron, MD;  Location: [facility] ENDOSCOPY;  Service: Cardiovascular;  Laterality: N/A;   cataract surgery  2015   Dr. Herbert Deaner; March & April 2015   Patient Active Problem List    Diagnosis Date Noted   Secondary hypercoagulable state ([place]) 12/12/2021   Hyponatremia 12/04/2021   Acute CHF (congestive heart failure) ([place]) 12/02/2021   Acute on chronic diastolic CHF (congestive heart failure) ([place]) 12/02/2021   Atrial fibrillation with rapid ventricular response ([place]) 12/02/2021   Essential hypertension 12/02/2021   GERD without esophagitis 12/02/2021   Obstructive sleep apnea 12/02/2021   Interstitial lung disease ([place]) 12/02/2021   History of pulmonary embolism 12/02/2021   Pulmonary embolism ([place]) 11/06/2021   Bronchiectasis ([place]) 11/06/2021   Deviated septum 05/08/2020   Mass of subcutaneous tissue 05/02/2020   Vertigo 06/16/2019   Pulmonary hypertension, unspecified ([place]) 04/14/2019   Ischemic colitis ([place]) 04/14/2019   Migraine with aura and without status migrainosus, not intractable 11/08/2018   Degeneration of lumbar intervertebral disc 10/14/2018   Hoarseness of voice 03/04/2018   Abdominal pain 07/01/2017   Diverticulitis, colon    Metabolic acidosis, increased anion gap    Constipation 04/14/2017   Lumbar hernia 04/14/2017   Presbycusis of both ears 01/10/2017   Tinnitus aurium, bilateral 01/10/2017   Gastroesophageal reflux disease 08/20/2016   Hemoptysis 08/20/2016   Obstructive sleep apnea of adult 08/20/2016   Rhinitis, chronic 08/20/2016   Throat pain in adult 08/20/2016   Fatigue  12/23/2015   Sciatica of right side 10/06/2015   History of migraine headaches 10/06/2015   Frequent PVCs 12/28/2013   Premature atrial contractions 12/28/2013   PSVT (paroxysmal supraventricular tachycardia) ([place]) 12/28/2013   Heart palpitations 07/13/2013   Sleep apnea 04/11/2013   Scoliosis 04/11/2013   Atypical atrial flutter ([place]) 11/29/2012   Chest pain, atypical 11/29/2012   Fibromyalgia syndrome 11/29/2012   Chronic steroid use 11/29/2012      REFERRING PROVIDER: Lawerance Cruel, MD   REFERRING DIAG: M54.9 (ICD-10-CM) - Back pain    Rationale for Evaluation and Treatment Rehabilitation  THERAPY DIAG:  Other idiopathic scoliosis, thoracolumbar region  Muscle weakness (generalized)  Cervicalgia  Pain in thoracic spine  Abnormal posture  ONSET DATE: chronic LBP  SUBJECTIVE:                                                                                                                                       My car wouldn't start so I've walked all over the neighborhood looking for a ride.  "I've had my exercise!"  I am moving around almost normally (my version of normal movement).  I can do more around the house but not where I've been before my health troubles last year.  I have someone who is coming to help with some of the house cleaning.  I have done some cooking the other day which I haven't been up to before then.  I have gone outside for some walks.  I have more energy.    SUBJECTIVE STATEMENT:  From evaluation: I have had a bad year.  I had PE, was hospitalized, have a swollen Rt leg, am now on Lasix, I'm in afib (cardioversion didn't stick).  Blood clot in leg was ruled out.  My leg swelling is much better than it was but still very swollen.  I am easily winded even when I'm talking.   I have lost 5 inches with my scoliosis.  I am so compressed and painful on my Lt side I have no room for my stomach so eating is very challenging.  I struggle to move my bowels.   I can only lay on my Rt side or my back.  Laying on my Rt side so much can start hurting my hip.   My neck and shoulders always hurt.   My activity level has diminished signif due to getting so easily winded.  I won't be able to do the NuStep given how winded I get.  I am trying to consistently get back into my stretches from last PT sessions.   PERTINENT HISTORY:  Pt has cardiac doctor, rheumatologist Complex medical history: of note October 25, 2021 was positive for PE, hospitalized on March 6 through December 04, 2021 with increased palpitations and  was found to be in atrial fibrillation, cardioversion April 2023 was initially successful but not long term  Long history of  pain from signif scoliosis, has been in/out of PT for this History of migraine headaches Sleep apnea without tolerance of CPAP, uses mouthguard  PAIN:  PAIN:  Are you having pain? Yes NPRS scale: 5/10 Pain location: neck, back, shoulders, Rt hip Pain orientation: Right and Bilateral  PAIN TYPE: aching, throbbing, and tight Pain description: constant  Aggravating factors: activity, laying on Rt side Relieving factors: rest    PRECAUTIONS: None  WEIGHT BEARING RESTRICTIONS No  FALLS:  Has patient fallen in last 6 months? No  LIVING ENVIRONMENT: Lives with: lives alone Lives in: Other single level condo Stairs: No Has following equipment at home: None  OCCUPATION: retired  PLOF: Independent with household mobility without device and Independent with community mobility without device  PATIENT GOALS open up Lt side of trunk, work back into my stretches, less pain   OBJECTIVE:   DIAGNOSTIC FINDINGS:  No new imaging    SCREENING FOR RED FLAGS: Bowel or bladder incontinence: No Spinal tumors: No Cauda equina syndrome: No Compression fracture: No Abdominal aneurysm: No  COGNITION:  Overall cognitive status: Within functional limits for tasks assessed     SENSATION: WFL  MUSCLE LENGTH: End range bil hamstring limited Lt lat limits full Lt shoulder flexion  POSTURE:  severe scoliosis, concave on Lt with lower ribcage and pelvis approximation  PALPATION: 08/28/22: improved soft tissue and fascial mobility along Lt trunk related to short compressed side of scoliosis, ongoing rigidity of ribcage and spine; improved Rt LE edema control with compression stockings  Evaluation: Diffuse tenderness cervical, thoracic soft tissues, very tight Lt latissimus dorsi, bil pectorals, Lt obliques and QL, Rt hip Non-pitting edema in Rt leg and foot  present - under care of MD and DVT ruled out per chart review, on Lasix  CERVICAL ROM:   Active  A/PROM  eval A/ROM 08/28/22 A/ROM  Flexion 57 65 65  Extension 35 35 35  Right lateral flexion '12 25 25  '$ Left lateral flexion '15 25 20  '$ Right rotation 40 35 40  Left rotation 40 35 50   (Blank rows = not tested)  LUMBAR ROM:   Active  A/PROM  eval A/ROM 11/30  Flexion Fingers to mid shin Fingers to ankles  Extension Not tested Not tested  Right lateral flexion 5 8  Left lateral flexion 3 5  Right rotation 20% 50%  Left rotation 20% 35%   (Blank rows = not tested)  LOWER EXTREMITY ROM:     Passive  Right eval Left eval Right 11/30 Left 11/30  Hip flexion      Hip extension      Hip abduction      Hip adduction      Hip internal rotation 35 40 35 45  Hip external rotation 50 60 60 65  Knee flexion      Knee extension      Ankle dorsiflexion      Ankle plantarflexion      Ankle inversion      Ankle eversion       (Blank rows = not tested)  LOWER EXTREMITY MMT:     11/30: 4/5 bil LE Eval: 4/5 bil LE   GAIT: Distance walked: within clinic Assistive device utilized: None Level of assistance: Complete Independence Comments: gets very winded    TODAY'S TREATMENT  Date: 10/22/22 NuStep L2 x 5' PT present to discuss status and review goals - Pt able to converse throughout therex Objective measures (see above) Seated upper traps and cervical  paraspinal STM bil Rt SL paraspinal broadening pelvis to shoulders bil, Lt obliques and QL Verbal review of goals, HEP  Date: 10/15/22 NuStep L2 x 5' PT present to discuss status Doorway Lt UE reach up and over stretch 3x10" Pec doorway stretch x30" Sit to stand with yellow band horiz abd on stand x 10 reps Rt SL paraspinal broadening pelvis to shoulders bil, Lt obliques and QL Seated upper traps and cervical paraspinal STM bil Cervical A/ROM rotation bil 5x10" holds  Date: 10/08/22 NuStep L2 x 5' PT present to discuss  status Standing red band row x20 Doorway Lt UE reach up and over stretch 3x10" Rt SL: Lt trunk STM for elongation along obliques, lats, paraspinals, QL Seated STM bil upper quadrants, thoracic paraspinals, pectorals   PATIENT EDUCATION:  Education details: discussed plan of care and goals Person educated: Patient Education method: Explanation Education comprehension: verbalized understanding   HOME EXERCISE PROGRAM: Access Code: [email or web address] URL: [email or web address] Date: 08/07/2022 Prepared by: Venetia Night Kajuana Shareef  Exercises - Standing Row with Anchored Resistance  - 1 x daily - 7 x weekly - 2 sets - 10 reps - Standing Shoulder Extension with Resistance  - 1 x daily - 7 x weekly - 2 sets - 10 reps - Standing Shoulder External Rotation with Resistance  - 1 x daily - 7 x weekly - 2 sets - 10 reps - Standing Shoulder Horizontal Abduction with Resistance  - 1 x daily - 7 x weekly - 2 sets - 10 reps  ASSESSMENT:  CLINICAL IMPRESSION: Pt reports she is beginning to see carry over of improvement from PT enabling her to feel well enough to start participating in more social events which are taxing on her body but good for her mind and spirit.  She has just started to cook again and reports she is able to do more around the house before pain increases.  She continues to have low cardiovascular endurance secondary to signif medical events last year.  She is up to 5' on NuStep while holding a conversation although the last 2 min she does display beginnings of SOB.  Pain has reduced to 5/10 on some days which allow her to perform household and community tasks for short durations.  Her posture and mobility are greatly affected by degenerative scoliosis which affect multiple body systems including GI and respiratory systems.  She greatly benefits from postural therex and manual techniques to optimize alignment and mobility within curvature.  She will continue to benefit from skilled PT to  maximize her engagement in community activities, social engagements, and tolerance of self care and household tasks.   OBJECTIVE IMPAIRMENTS decreased activity tolerance, decreased endurance, decreased mobility, decreased ROM, decreased strength, hypomobility, increased fascial restrictions, increased muscle spasms, impaired flexibility, impaired UE functional use, improper body mechanics, postural dysfunction, and pain.   ACTIVITY LIMITATIONS carrying, lifting, bending, standing, sleeping, transfers, and reach over head  PARTICIPATION LIMITATIONS: cleaning, laundry, driving, shopping, community activity, and yard work  PERSONAL FACTORS Age and Time since onset of injury/illness/exacerbation are also affecting patient's functional outcome.   REHAB POTENTIAL: Good  CLINICAL DECISION MAKING: Stable/uncomplicated  EVALUATION COMPLEXITY: Low   GOALS: Goals reviewed with patient? Yes  SHORT TERM GOALS: Target date: 07/31/22  Pt will be ind with initial HEP for trunk and LE mobility and stretching Baseline: Goal status: met    LONG TERM GOALS: Target date: 12/03/22  Pt will be ind with strategies for symptom management including pacing, spinal decompression, positioning, and HEP. Baseline:  Goal status: met  2.  Pt will improve bil hamstring length to WNL to reduce back pain Baseline:  Goal status: met  3.  Pt will be able to perform light daily tasks within home for up to 30 min before needing a positional rest break. Baseline:  Goal status: met for some days of the week, ongoing  4.  Pt will achieve improved soft tissue mobility of Lt side of trunk and bil cervical region to reduce pain and decompress abdominal organs Baseline:  Goal status: met  5.  Improve neck rotation to at least 50 deg for improved visibility with driving. Baseline: 40 deg bil Goal status: ongoing, regressed to 35 deg on 11/30  6. Pt will be able to perform NuStep for up to 8 min while holding  conversation to demo improved cardiovascular endurance.  Baseline: 4-5'  Goal status: new     PLAN: PT FREQUENCY: taper to 1x/week  PT DURATION: 6 weeks  PLANNED INTERVENTIONS: Therapeutic exercises, Therapeutic activity, Neuromuscular re-education, Balance training, Patient/Family education, Self Care, Joint mobilization, Aquatic Therapy, Dry Needling, Electrical stimulation, Spinal mobilization, Cryotherapy, Moist heat, and Manual therapy.  PLAN FOR NEXT SESSION: NuStep, work towards consistent routine for HEP, conitnue manual therapy for ease of motion and release of tissues  Mariene Dickerman, PT 10/22/22 11:47 AM

## 2022-10-23 NOTE — Telephone Encounter (Signed)
Upcoming appointment on 02/08.

## 2022-10-29 ENCOUNTER — Encounter: Payer: Self-pay | Admitting: Physical Therapy

## 2022-10-29 ENCOUNTER — Ambulatory Visit: Payer: Medicare Other | Admitting: Physical Therapy

## 2022-10-29 DIAGNOSIS — M542 Cervicalgia: Secondary | ICD-10-CM | POA: Diagnosis not present

## 2022-10-29 DIAGNOSIS — M545 Low back pain, unspecified: Secondary | ICD-10-CM

## 2022-10-29 DIAGNOSIS — M546 Pain in thoracic spine: Secondary | ICD-10-CM

## 2022-10-29 DIAGNOSIS — M4125 Other idiopathic scoliosis, thoracolumbar region: Secondary | ICD-10-CM | POA: Diagnosis not present

## 2022-10-29 DIAGNOSIS — G8929 Other chronic pain: Secondary | ICD-10-CM

## 2022-10-29 DIAGNOSIS — R293 Abnormal posture: Secondary | ICD-10-CM

## 2022-10-29 DIAGNOSIS — M6281 Muscle weakness (generalized): Secondary | ICD-10-CM | POA: Diagnosis not present

## 2022-10-29 NOTE — Therapy (Signed)
OUTPATIENT PHYSICAL THERAPY TREATMENT   Patient Name: Janet Nguyen MRN: [id number] DOB:[birth year], 89 y.o., female Today's Date: 10/29/2022   PT End of Session - 10/29/22 1226     Visit Number 18    Date for PT Re-Evaluation 12/03/22    Authorization Type Medicare Part A/B    Progress Note Due on Visit 80    PT Start Time 1100    PT Stop Time 1145    PT Time Calculation (min) 45 min                     Past Medical History:  Diagnosis Date   Arrhythmia    History of SVT with documented PVC'S and  PAC'S  12/08/12 Nuc stress test normal LV EF 74%  Event Monitor  12/01/12-01/03/13   Atrial flutter (HCC)    Celiac disease    treated by Dr. 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 Marijean Bravo; pt states she was diagnosed 10-15 years ago, not treated at this time or any issues that she is aware of.   Scoliosis    Scoliosis    Sleep apnea 10/02/11 [facility]   Sleep study AHI -total sleep 10.3/hr  64.0/ hr during REM sleep.RDI 22.8/hr during total sleep 64.0/hr during REM sleep The lowest O2 sat during Non-REM and REM sleep was 86% and 88% respectively. 04/08/12 CPAP/BIPAP titration study [facility]   Past Surgical History:  Procedure Laterality Date   APPENDECTOMY     ruptured at age 63 and had surgery   CARDIAC CATHETERIZATION  01/27/06   CARDIOVERSION N/A 01/08/2022   Procedure: CARDIOVERSION;  Surgeon: Freada Bergeron, MD;  Location: [facility] ENDOSCOPY;  Service: Cardiovascular;  Laterality: N/A;   cataract surgery  2015   Dr. Herbert Deaner; March & April 2015   Patient Active Problem List   Diagnosis Date Noted   Secondary hypercoagulable state ([place]) 12/12/2021   Hyponatremia 12/04/2021   Acute CHF (congestive heart failure) ([place])  12/02/2021   Acute on chronic diastolic CHF (congestive heart failure) ([place]) 12/02/2021   Atrial fibrillation with rapid ventricular response ([place]) 12/02/2021   Essential hypertension 12/02/2021   GERD without esophagitis 12/02/2021   Obstructive sleep apnea 12/02/2021   Interstitial lung disease ([place]) 12/02/2021   History of pulmonary embolism 12/02/2021   Pulmonary embolism ([place]) 11/06/2021   Bronchiectasis ([place]) 11/06/2021   Deviated septum 05/08/2020   Mass of subcutaneous tissue 05/02/2020   Vertigo 06/16/2019   Pulmonary hypertension, unspecified ([place]) 04/14/2019   Ischemic colitis ([place]) 04/14/2019   Migraine with aura and without status migrainosus, not intractable 11/08/2018   Degeneration of lumbar intervertebral disc 10/14/2018   Hoarseness of voice 03/04/2018   Abdominal pain 07/01/2017   Diverticulitis, colon    Metabolic acidosis, increased anion gap    Constipation 04/14/2017   Lumbar hernia 04/14/2017   Presbycusis of both ears 01/10/2017   Tinnitus aurium, bilateral 01/10/2017   Gastroesophageal reflux disease 08/20/2016   Hemoptysis 08/20/2016   Obstructive sleep apnea of adult 08/20/2016   Rhinitis, chronic 08/20/2016   Throat pain in adult 08/20/2016   Fatigue 12/23/2015   Sciatica of right side 10/06/2015   History of migraine headaches 10/06/2015   Frequent PVCs 12/28/2013  Premature atrial contractions 12/28/2013   PSVT (paroxysmal supraventricular tachycardia) (HCC) 12/28/2013   Heart palpitations 07/13/2013   Sleep apnea 04/11/2013   Scoliosis 04/11/2013   Atypical atrial flutter ([place]) 11/29/2012   Chest pain, atypical 11/29/2012   Fibromyalgia syndrome 11/29/2012   Chronic steroid use 11/29/2012      REFERRING PROVIDER: Lawerance Cruel, MD   REFERRING DIAG: M54.9 (ICD-10-CM) - Back pain   Rationale for Evaluation and Treatment Rehabilitation  THERAPY DIAG:  Other idiopathic scoliosis, thoracolumbar region  Cervicalgia  Muscle  weakness (generalized)  Pain in thoracic spine  Abnormal posture  Chronic bilateral low back pain without sciatica  ONSET DATE: chronic LBP  SUBJECTIVE:                                                                                                                                       I am doing better getting 1-2 sets of the HEP in each day most days.  I am coming down off a migraine so not feeling my best.  I did some pruning in my garden so that counted as my exercise that day.  I was glad I was able to do it.  I had one episode of my Lt hand going numb and my neck was hurting and still hurts.  No more numbness but that had me concerned.  SUBJECTIVE STATEMENT:  From evaluation: I have had a bad year.  I had PE, was hospitalized, have a swollen Rt leg, am now on Lasix, I'm in afib (cardioversion didn't stick).  Blood clot in leg was ruled out.  My leg swelling is much better than it was but still very swollen.  I am easily winded even when I'm talking.   I have lost 5 inches with my scoliosis.  I am so compressed and painful on my Lt side I have no room for my stomach so eating is very challenging.  I struggle to move my bowels.   I can only lay on my Rt side or my back.  Laying on my Rt side so much can start hurting my hip.   My neck and shoulders always hurt.   My activity level has diminished signif due to getting so easily winded.  I won't be able to do the NuStep given how winded I get.  I am trying to consistently get back into my stretches from last PT sessions.   PERTINENT HISTORY:  Pt has cardiac doctor, rheumatologist Complex medical history: of note October 25, 2021 was positive for PE, hospitalized on March 6 through December 04, 2021 with increased palpitations and was found to be in atrial fibrillation, cardioversion April 2023 was initially successful but not long term  Long history of pain from signif scoliosis, has been in/out of PT for this History of migraine  headaches Sleep apnea without tolerance of CPAP, uses mouthguard  PAIN:  PAIN:  Are you  having pain? Yes NPRS scale: 5/10 Pain location: neck, back, shoulders, Rt hip Pain orientation: Right and Bilateral  PAIN TYPE: aching, throbbing, and tight Pain description: constant  Aggravating factors: activity, laying on Rt side Relieving factors: rest    PRECAUTIONS: None  WEIGHT BEARING RESTRICTIONS No  FALLS:  Has patient fallen in last 6 months? No  LIVING ENVIRONMENT: Lives with: lives alone Lives in: Other single level condo Stairs: No Has following equipment at home: None  OCCUPATION: retired  PLOF: Independent with household mobility without device and Independent with community mobility without device  PATIENT GOALS open up Lt side of trunk, work back into my stretches, less pain   OBJECTIVE:   DIAGNOSTIC FINDINGS:  No new imaging    SCREENING FOR RED FLAGS: Bowel or bladder incontinence: No Spinal tumors: No Cauda equina syndrome: No Compression fracture: No Abdominal aneurysm: No  COGNITION:  Overall cognitive status: Within functional limits for tasks assessed     SENSATION: WFL  MUSCLE LENGTH: End range bil hamstring limited Lt lat limits full Lt shoulder flexion  POSTURE:  severe scoliosis, concave on Lt with lower ribcage and pelvis approximation  PALPATION: 08/28/22: improved soft tissue and fascial mobility along Lt trunk related to short compressed side of scoliosis, ongoing rigidity of ribcage and spine; improved Rt LE edema control with compression stockings  Evaluation: Diffuse tenderness cervical, thoracic soft tissues, very tight Lt latissimus dorsi, bil pectorals, Lt obliques and QL, Rt hip Non-pitting edema in Rt leg and foot present - under care of MD and DVT ruled out per chart review, on Lasix  CERVICAL ROM:   Active  A/PROM  eval A/ROM 08/28/22 A/ROM  Flexion 57 65 65  Extension 35 35 35  Right lateral flexion '12 25 25   '$ Left lateral flexion '15 25 20  '$ Right rotation 40 35 40  Left rotation 40 35 50   (Blank rows = not tested)  LUMBAR ROM:   Active  A/PROM  eval A/ROM 11/30  Flexion Fingers to mid shin Fingers to ankles  Extension Not tested Not tested  Right lateral flexion 5 8  Left lateral flexion 3 5  Right rotation 20% 50%  Left rotation 20% 35%   (Blank rows = not tested)  LOWER EXTREMITY ROM:     Passive  Right eval Left eval Right 11/30 Left 11/30  Hip flexion      Hip extension      Hip abduction      Hip adduction      Hip internal rotation 35 40 35 45  Hip external rotation 50 60 60 65  Knee flexion      Knee extension      Ankle dorsiflexion      Ankle plantarflexion      Ankle inversion      Ankle eversion       (Blank rows = not tested)  LOWER EXTREMITY MMT:     11/30: 4/5 bil LE Eval: 4/5 bil LE   GAIT: Distance walked: within clinic Assistive device utilized: None Level of assistance: Complete Independence Comments: gets very winded    TODAY'S TREATMENT  Date: 10/29/22 NuStep L1 x 5' PT present to discuss status Seated STM Lt upper quadrant, bil cervical paraspinals and intrascapular region Rt SL paraspinal broadening pelvis to shoulders bil  Date: 10/22/22 NuStep L2 x 5' PT present to discuss status and review goals - Pt able to converse throughout therex Objective measures (see above) Seated upper traps and  cervical paraspinal STM bil Rt SL paraspinal broadening pelvis to shoulders bil, Lt obliques and QL Verbal review of goals, HEP  Date: 10/15/22 NuStep L2 x 5' PT present to discuss status Doorway Lt UE reach up and over stretch 3x10" Pec doorway stretch x30" Sit to stand with yellow band horiz abd on stand x 10 reps Rt SL paraspinal broadening pelvis to shoulders bil, Lt obliques and QL Seated upper traps and cervical paraspinal STM bil Cervical A/ROM rotation bil 5x10" holds  PATIENT EDUCATION:  Education details: discussed plan of care  and goals Person educated: Patient Education method: Explanation Education comprehension: verbalized understanding   HOME EXERCISE PROGRAM: Access Code: [email or web address] URL: [email or web address] Date: 08/07/2022 Prepared by: Venetia Night Montgomery Favor  Exercises - Standing Row with Anchored Resistance  - 1 x daily - 7 x weekly - 2 sets - 10 reps - Standing Shoulder Extension with Resistance  - 1 x daily - 7 x weekly - 2 sets - 10 reps - Standing Shoulder External Rotation with Resistance  - 1 x daily - 7 x weekly - 2 sets - 10 reps - Standing Shoulder Horizontal Abduction with Resistance  - 1 x daily - 7 x weekly - 2 sets - 10 reps  ASSESSMENT:  CLINICAL IMPRESSION: Pt was able to do light gardening, cooking and attended a social outing since last visit.  She was coming off a migraine today so exercise was limited to NuStep.  She was able to converse throughout cardio time x 5' demo'ing improving cardiovascular endurance.  She was very tight today along Lt upper quadrant which may have contributed to single episode of Lt hand numbness which had since resolved.  Pt continues to benefit from manual therapy which has allowed her greater activity level around her home, yard and social events.     OBJECTIVE IMPAIRMENTS decreased activity tolerance, decreased endurance, decreased mobility, decreased ROM, decreased strength, hypomobility, increased fascial restrictions, increased muscle spasms, impaired flexibility, impaired UE functional use, improper body mechanics, postural dysfunction, and pain.   ACTIVITY LIMITATIONS carrying, lifting, bending, standing, sleeping, transfers, and reach over head  PARTICIPATION LIMITATIONS: cleaning, laundry, driving, shopping, community activity, and yard work  PERSONAL FACTORS Age and Time since onset of injury/illness/exacerbation are also affecting patient's functional outcome.   REHAB POTENTIAL: Good  CLINICAL DECISION MAKING:  Stable/uncomplicated  EVALUATION COMPLEXITY: Low   GOALS: Goals reviewed with patient? Yes  SHORT TERM GOALS: Target date: 07/31/22  Pt will be ind with initial HEP for trunk and LE mobility and stretching Baseline: Goal status: met    LONG TERM GOALS: Target date: 12/03/22  Pt will be ind with strategies for symptom management including pacing, spinal decompression, positioning, and HEP. Baseline:  Goal status: met  2.  Pt will improve bil hamstring length to WNL to reduce back pain Baseline:  Goal status: met  3.  Pt will be able to perform light daily tasks within home for up to 30 min before needing a positional rest break. Baseline:  Goal status: met for some days of the week, ongoing  4.  Pt will achieve improved soft tissue mobility of Lt side of trunk and bil cervical region to reduce pain and decompress abdominal organs Baseline:  Goal status: met  5.  Improve neck rotation to at least 50 deg for improved visibility with driving. Baseline: 40 deg bil Goal status: ongoing, regressed to 35 deg on 11/30  6. Pt will be able to perform NuStep for up to 8 min while  holding conversation to demo improved cardiovascular endurance.  Baseline: 4-5'  Goal status: new     PLAN: PT FREQUENCY: taper to 1x/week  PT DURATION: 6 weeks  PLANNED INTERVENTIONS: Therapeutic exercises, Therapeutic activity, Neuromuscular re-education, Balance training, Patient/Family education, Self Care, Joint mobilization, Aquatic Therapy, Dry Needling, Electrical stimulation, Spinal mobilization, Cryotherapy, Moist heat, and Manual therapy.  PLAN FOR NEXT SESSION: NuStep, work towards consistent routine for HEP, conitnue manual therapy for ease of motion and release of tissues  Apphia Cropley, PT 10/29/22 12:27 PM

## 2022-10-29 NOTE — Progress Notes (Signed)
Cardiology Office Note:    Date:  11/06/2022   ID:  Janet, Nguyen 10/03/1931, MRN [id number]  PCP:  Lawerance Cruel, [place] Prov[facility] Cardiologist:  Shelva Majestic, MD Cardiology APP:  Ledora Bottcher, [place]     Referring MD: Lawerance Cruel, MD   Chief Complaint  Patient presents with   Follow-up    HFpEF, Afib/flutter    History of Present Illness:    Janet Nguyen is a 89 y.o. female with a hx of SVT, PACs, PVCs treated with beta-blocker therapy, OSA not on CPAP, scoliosis, GERD, optical migraines, mild nonobstructive carotid artery disease, and chronic chest pain related to her posture from scoliosis.  She was referred to lumbar pulmonology for bronchiectasis and shortness of breath.  VQ scan showed positive PE 10/25/2021.  She was started on Eliquis and Spiriva.  She was diagnosed with atrial fibrillation during a hospitalization in March 2023.  Echocardiogram at that time revealed LVEF 60-65% moderate biatrial enlargement, and small circumferential pericardial effusion.  She was seen in follow-up in May 2023 with Dr. [name] and noted to be back in A-fib.  Cardizem was increased to 180 mg daily and metoprolol 75 mg twice daily was continued.  Given her pulmonary history, she is felt to not be a candidate for amiodarone.  She was referred to A-fib clinic.  Tikosyn load was planned but she called back and canceled the scheduled hospital admission.  She also self reduced her Cardizem to 120 mg due to questionable stomach upset.  In follow-up, rate control strategy was decided upon and digoxin was added to her regimen.  She has also seen Dr. Chase Caller back and has not been satisfied with her appointments.  CT CAT scan did show enlarged pulmonary artery suggestive of pulmonary artery hypertension.  BNP in March was 640, pro-BNP 03/2022 was 870.  Due to bilateral lower extremity edema, Dr. [name] increased her dose of Lasix from 20 mg to 40 mg every other  day.  I saw her in follow up 06/05/22 with improved LE swelling. She continued 40 mg lasix daily. She was frustrated at not being able to continue yoga in the floor. Weight between 113-119 lbs - I reduced her dose of eliquis to 2.5 mg BID. She reports palpitations at night when going to bed. I saw her in Oct 2023  and she brought an excel printout of side effects of each of her medications. I referred out to dermatology and GI. She was seen by Dr. [name] in Dec 2023. It appears sh was switched from lasix to 20 mg torsemide BID.   She presents back with intention to discuss her medications. She again brings her folder of spreadsheets. She is now taking 20 mg torsemide in the morning and 10 mg torsemide in the evening. She has finally had resolution of her leg swelling. She is also taking 10 mEq potassium once daily. She is still taking 2.5 mg eliquis BID - appropriately dosed. She is now 50.9 kg.  She reports red dots on her back - I do not think this is from eliquis. She reports left hand numbness x 15 minutes - this has happened twice and sounds related to her progressive scoliosis. She reports our answering service is terrible and needs to be fixed.   She has increased anxiety and family is not coming to visit anymore. I asked if she needs to move to a facility.    Past Medical History:  Diagnosis Date   Arrhythmia    History of SVT with documented PVC'S and  PAC'S  12/08/12 Nuc stress test normal LV EF 74%  Event Monitor  12/01/12-01/03/13   Atrial flutter ([place])    Celiac disease    treated by Dr. 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 Marijean Bravo; pt states she was diagnosed 10-15 years ago, not treated at this time or any issues that she is aware of.   Scoliosis    Scoliosis    Sleep apnea  10/02/11 [facility]   Sleep study AHI -total sleep 10.3/hr  64.0/ hr during REM sleep.RDI 22.8/hr during total sleep 64.0/hr during REM sleep The lowest O2 sat during Non-REM and REM sleep was 86% and 88% respectively. 04/08/12 CPAP/BIPAP titration study [facility]    Past Surgical History:  Procedure Laterality Date   APPENDECTOMY     ruptured at age 46 and had surgery   CARDIAC CATHETERIZATION  01/27/06   CARDIOVERSION N/A 01/08/2022   Procedure: CARDIOVERSION;  Surgeon: Freada Bergeron, MD;  Location[facility] ENDOSCOPY;  Service: Cardiovascular;  Laterality: N/A;   cataract surgery  2015   Dr. Herbert Deaner; March & April 2015    Current Medications: Current Meds  Medication Sig   acetaminophen (TYLENOL) 500 MG tablet Take 500 mg by mouth every 8 (eight) hours as needed for moderate pain.   Aloe-Sodium Chloride (AYR SALINE NASAL GEL NA) Place 1 application  into the nose daily as needed (rhinitis).   ALPRAZolam (XANAX) 0.5 MG tablet Take 0.25-0.5 mg by mouth 2 (two) times daily as needed for anxiety.   apixaban (ELIQUIS) 2.5 MG TABS tablet Take 1 tablet (2.5 mg total) by mouth 2 (two) times daily.   apixaban (ELIQUIS) 2.5 MG TABS tablet Take 1 tablet (2.5 mg total) by mouth 2 (two) times daily.   Biotin w/ Vitamins C & E (HAIR SKIN & NAILS GUMMIES PO) Take 1 tablet by mouth daily.   Calcium Carb-Ergocalciferol (CHEWABLE CALCIUM/D PO) Take 1 each by mouth daily.   clidinium-chlordiazePOXIDE (LIBRAX) 5-2.5 MG capsule Take 1 capsule by mouth daily as needed.   digoxin (LANOXIN) 0.125 MG tablet TAKE 1/2 TABLET BY MOUTH DAILY   diltiazem (CARDIZEM CD) 120 MG 24 hr capsule Take 1 capsule (120 mg total) by mouth every morning.   metoprolol succinate (TOPROL-XL) 50 MG 24 hr tablet TAKE 1 AND 1/2 TABLETS BY MOUTH TWO TIMES A DAY   Phenylephrine-APAP-guaiFENesin (EQ SINUS CONGESTION & PAIN PO) Take 1 tablet by mouth daily as needed (for sinus pain). Contains  acetaminophen 325 mg and Phenylephrine 5 mg   polyethylene glycol powder (GLYCOLAX/MIRALAX) 17 GM/SCOOP powder Take 17 g by mouth daily as needed for moderate constipation.   Tiotropium Bromide Monohydrate (SPIRIVA RESPIMAT) 1.25 MCG/ACT AERS Inhale 2 puffs into the lungs daily.   zolpidem (AMBIEN) 5 MG tablet Take 2.5 mg by mouth at bedtime.     Allergies:   Gluten meal and Naproxen   Social History   Socioeconomic History   Marital status: Divorced    Spouse name: Not on file   Number of children: 4   Years of education: college    Highest education level: Not on file  Occupational History  Occupation: retired   Tobacco Use   Smoking status: Never   Smokeless tobacco: Never   Tobacco comments:    Never smoke 12/12/21  Vaping Use   Vaping Use: Never used  Substance and Sexual Activity   Alcohol use: Yes    Alcohol/week: 3.0 - 4.0 standard drinks of alcohol    Types: 3 - 4 Glasses of wine per week    Comment: 1 glass of wine 3-4 times weekly 12/12/21   Drug use: No   Sexual activity: Not on file  Other Topics Concern   Not on file  Social History Narrative   Lives alone at home   Drinks tea but tries to drink decaf   Has 9 grandchildren    Right handed   Social Determinants of Health   Financial Resource Strain: Low Risk  (12/03/2021)   Overall Financial Resource Strain (CARDIA)    Difficulty of Paying Living Expenses: Not hard at all  Food Insecurity: No Food Insecurity (12/03/2021)   Hunger Vital Sign    Worried About Running Out of Food in the Last Year: Never true    Ran Out of Food in the Last Year: Never true  Transportation Needs: No Transportation Needs (12/03/2021)   PRAPARE - Hydrologist (Medical): No    Lack of Transportation (Non-Medical): No  Physical Activity: Not on file  Stress: Not on file  Social Connections: Not on file     Family History: The patient's family history includes Breast cancer in her mother; Heart  disease in her father. There is no history of Migraines.  ROS:   Please see the history of present illness.     All other systems reviewed and are negative.  EKGs/Labs/Other Studies Reviewed:    The following studies were reviewed today:  Echo 11/2021: 1. Left ventricular ejection fraction, by estimation, is 60 to 65%. Left  ventricular ejection fraction by 2D MOD biplane is 60.8 %. The left  ventricle has normal function. The left ventricle has no regional wall  motion abnormalities. There is mild left  ventricular hypertrophy. Left ventricular diastolic function could not be  evaluated.   2. Left atrial size was moderately dilated.   3. Right atrial size was moderately dilated.   4. The pericardial effusion is circumferential. There is no evidence of  cardiac tamponade.   5. The mitral valve is myxomatous. Moderate mitral valve regurgitation.  There is moderate late systolic prolapse of the middle scallop of the  posterior leaflet of the mitral valve.   6. Tricuspid valve regurgitation is moderate.   7. The aortic valve is tricuspid. Aortic valve sclerosis is present, with  no evidence of aortic valve stenosis.   8. There is normal pulmonary artery systolic pressure. The estimated  right ventricular systolic pressure is 09.3 mmHg.   9. The inferior vena cava is dilated in size with >50% respiratory  variability, suggesting right atrial pressure of 8 mmHg.   EKG:  EKG is not ordered today.    Recent Labs: 01/29/2022: Magnesium 1.9 04/17/2022: Pro B Natriuretic peptide (BNP) 870.0 05/14/2022: BNP 515.3 09/15/2022: ALT 15; BUN 20; Creatinine, Ser 1.07; Hemoglobin 14.4; Platelets 269; Potassium 4.3; Sodium 136; TSH 1.220  Recent Lipid Panel    Component Value Date/Time   CHOL 154 12/03/2016 0845   TRIG 81 12/03/2016 0845   HDL 67 12/03/2016 0845   CHOLHDL 2.3 12/03/2016 0845   VLDL 16 12/03/2016 0845   LDLCALC 71 12/03/2016 0845  Risk Assessment/Calculations:     CHA2DS2-VASc Score = 6   This indicates a 9.7% annual risk of stroke. The patient's score is based upon: CHF History: 1 HTN History: 1 Diabetes History: 0 Stroke History: 0 Vascular Disease History: 1 (PE) Age Score: 2 Gender Score: 1   Physical Exam:    VS:  BP 124/82   Pulse 77   Ht 5' (1.524 m)   Wt 112 lb 3.2 oz (50.9 kg)   SpO2 97%   BMI 21.91 kg/m     Wt Readings from Last 3 Encounters:  11/06/22 112 lb 3.2 oz (50.9 kg)  10/30/22 111 lb 9.6 oz (50.6 kg)  09/15/22 124 lb (56.2 kg)     GEN:  Well nourished, well developed in no acute distress HEENT: Normal NECK: No JVD; No carotid bruits LYMPHATICS: No lymphadenopathy CARDIAC: irregular rhythm, regular rate RESPIRATORY:  Clear to auscultation without rales, wheezing or rhonchi  ABDOMEN: Soft, non-tender, non-distended MUSCULOSKELETAL:  No edema; No deformity  SKIN: Warm and dry NEUROLOGIC:  Alert and oriented x 3 PSYCHIATRIC:  Normal affect   ASSESSMENT:    1. Persistent atrial fibrillation ([place])   2. SVT (supraventricular tachycardia)   3. Frequent PVCs   4. PSVT (paroxysmal supraventricular tachycardia) (HCC)   5. Other pulmonary embolism without acute cor pulmonale, unspecified chronicity ([place])   6. Chronic anticoagulation   7. Chronic heart failure with preserved ejection fraction (HCC)   8. Obstructive sleep apnea of adult    PLAN:    In order of problems listed above:  Persistent atrial fibrillation SVT, PACs, PVCs Rate controlled on metoprolol, Cardizem 120 mg, digoxin 0.0625 mg - digoxin level 06/06/22 was < 0.4 - no syncope or falls   History of PE, clear on recent CT chest Chronic anticoagulation No bleeding issues on Eliquis On reduced dose of 2.5 mg twice daily for age and weight We will continue Eliquis given her PAF - gave samples   Moderate MR Bilateral lower extremity edema HFpE Managing very well on 20 mg torsemide qAM and 10 mg qPM She is frustrated by cutting pills in  half, there is no lower dose She is finally back to normal in terms of swelling Since she self-adjusts doses, I will recheck BMP and Mg today   OSA not on CPAP Has mouth guard but does not use   Follow up with Dr. [name] as scheduled, and with me in June 2024   Of note - please call with all results. She sees on MyChart but never sees the provider's messages on labs.   Medication Adjustments/Labs and Tests Ordered: Current medicines are reviewed at length with the patient today.  Concerns regarding medicines are outlined above.  Orders Placed This Encounter  Procedures   Magnesium   Basic metabolic panel   Meds ordered this encounter  Medications   apixaban (ELIQUIS) 2.5 MG TABS tablet    Sig: Take 1 tablet (2.5 mg total) by mouth 2 (two) times daily.    Dispense:  42 tablet    Refill:  3   torsemide (DEMADEX) 20 MG tablet    Sig: Take 1.5 tablets (30 mg total) by mouth 2 (two) times daily. Take 1 Tablet in the Morning and 0.5 Tablets in the evening    Dispense:  90 tablet    Refill:  3    Patient Instructions  Medication Instructions:  Torsemide from 20 mg to 30 mg ( Take 1 Tablet in The Morning and 1/2 Tablet 10  mg in the Evening). *If you need a refill on your cardiac medications before your next appointment, please call your pharmacy*   Lab Work: BMET, Magnesium today If you have labs (blood work) drawn today and your tests are completely normal, you will receive your results only by: [place] (if you have MyChart) OR A paper copy in the mail If you have any lab test that is abnormal or we need to change your treatment, we will call you to review the results.   Testing/Procedures: No Testing   Follow-Up: At [facility], you and your health needs are our priority.  As part of our continuing mission to provide you with exceptional heart care, we have created designated Provider Care Teams.  These Care Teams include your primary Cardiologist  (physician) and Advanced Practice Providers (APPs -  Physician Assistants and Nurse Practitioners) who all work together to provide you with the care you need, when you need it.  We recommend signing up for the patient portal called "MyChart".  Sign up information is provided on this After Visit Summary.  MyChart is used to connect with patients for Virtual Visits (Telemedicine).  Patients are able to view lab/test results, encounter notes, upcoming appointments, etc.  Non-urgent messages can be sent to your provider as well.   To learn more about what you can do with MyChart, go to [email or web address].    Your next appointment:   4 month(s)  Provider:   Fabian Sharp, PA-C       Signed, Ledora Bottcher, [place]  11/06/2022 12:56 PM    [place]

## 2022-10-30 ENCOUNTER — Encounter: Payer: Self-pay | Admitting: Internal Medicine

## 2022-10-30 ENCOUNTER — Ambulatory Visit (INDEPENDENT_AMBULATORY_CARE_PROVIDER_SITE_OTHER): Payer: Medicare Other | Admitting: Internal Medicine

## 2022-10-30 VITALS — BP 100/64 | HR 88 | Temp 97.6°F | Ht 61.0 in | Wt 111.6 lb

## 2022-10-30 DIAGNOSIS — J479 Bronchiectasis, uncomplicated: Secondary | ICD-10-CM | POA: Diagnosis not present

## 2022-10-30 DIAGNOSIS — R0609 Other forms of dyspnea: Secondary | ICD-10-CM | POA: Diagnosis not present

## 2022-10-30 DIAGNOSIS — I2699 Other pulmonary embolism without acute cor pulmonale: Secondary | ICD-10-CM

## 2022-10-30 NOTE — Progress Notes (Signed)
OV 10/22/2021  Subjective:  Patient ID: Janet Nguyen, female , DOB: 03-23-1932 , age 89 y.o. , MRN: [id number] , ADDRESS: [place]  [id number] PCP Janet Cruel, MD Patient Care Team: Janet Cruel, MD as PCP - General (Family Medicine) Janet Sine, MD as PCP - Cardiology (Cardiology)  This Provider for this visit: Treatment Team:  Attending Provider: Brand Males, MD    10/22/2021 -   Chief Complaint  Patient presents with   Consult    Pt had a recent CT performed which is the reason for today's visit.     HPI Janet Nguyen 89 y.o. -referred for shortness of breath and cough.  Has known severe scoliosis.  Found to have bronchiectasis on the CT chest.  History is provided by the patient and review of the medical records.  She is a very good historian.  She is divorced.  She has grown children who all live all over the country.  She has several grandchildren.  She says she is known to have deviated nasal septum, celiac disease for which she is on a restrictive diet, sleep apnea but she cannot tolerate CPAP.  She is most importantly known to have severe scoliosis that was first diagnosed in her younger years.  She says after age 13 is around when they made the diagnosis although she suspect she has had a longer period she is just on observation treatment.  She did yoga and severe exercise to keep herself physically fit.  Over the years scoliosis is gotten worse that she is lost 6 inches in total height.  This is producing physical pressure effects.  She thinks with the onset of the pandemic and particularly with worsening of the scoliosis and particularly for the last few months or so she said dyspnea on exertion [echo did show grade 2 diastolic dysfunction], decreased socializing, decreased sleep, feeling depressed.  Also she feels her migraines are worse.  She when she wakes up in the morning she feels an elephant is on the chest.  She  also has a cough.  The respiratory symptoms are mild overall.  But she does definitely have it and is definitely new.  Sometime back a year ago or 2 years ago she is to be able to walk at least a mile now she gets dyspneic walking a block.  Of note she reports a few episodes of severe vomiting acute episodes 2 times in 2022.  Idiopathic.  There is associated heartburn  She has had the COVID-vaccine but does not have the COVID itself  Review of system positive for fatigue for the last several months arthralgia for the last several years.  Denies any family history of lung disease  Denies any substance abuse  Lives in a 87 year old home.  Detailed review shows no organic dust antigen exposure.  She does have a bird feeder.  Occupational history: Detail organic and inorganic antigen exposure history is negative    CT Chest data 10/09/21 -    IMPRESSION: 1. 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 Chronic aspiration could have a similar appearance. 2. Part solid nodule of the right lower lobe measuring 7.5 mm in mean diameter with 4 mm solid component. Follow-up non-contrast CT recommended at 3-6 months to confirm persistence. If unchanged,  and solid component remains <6 mm, annual CT is recommended until 5 years of stability has been established. If persistent these nodules should be considered highly suspicious if the solid component of the nodule is 6 mm or greater in size and enlarging. This recommendation follows the consensus statement: Guidelines for Management of Incidental Pulmonary Nodules Detected on CT Images: From the Fleischner Society 2017; Radiology 2017; 284:228-243. 3.  Aortic Atherosclerosis (ICD10-I70.0).     Electronically Signed   By: Janet Nguyen M.D.    On: 10/09/2021 15:34  No results found.   11/06/2021 Follow up : PE   89 yo female never smoker female never smoker seen for pulmonary consult 10/22/21 for bronchiectasis and shortness of breath found to have Pulmonary embolism on VQ scan .  Medical history of severe scoliosis /kyphosis , OSA -CPAP intolerant   TEST/EVENTS :  VQ scan 10/25/21- multiple moderate perfusion defects with RML/RLL + PE.  Venous Doppler 09/2021 neg  Echo Nml EF , Gr 2 DD, Elevated PAP 48mHg. Severe LAE and Mod RA, mod to severe MR   Patient returns for a 1 week follow up . Recently seen for pulmonary consult for bronchiectasis and shortness of breath. Labs showed elevated  D Dimer . Subsequent VQ scan on 10/25/21 was positive for PE. 2 D echo showed mild elevated PAP at 362mg, Gr 2 DD , Severe LAE and Mod RA, mod to severe MR. Venous doppler was negative for DVT.  Has some minimally productive dry cough. Was started on Spiriva but did not take.  She was started on Eliquis , she has started on starter pack .  Unfortunately patient's insurance does not cover and prescription is greater than $600.  We discussed patient assistance program.  We have also contacted our pharmacy department to help us[place] alternative options.  Patient education on anticoagulation therapy.  Patient says she has some minimum daily cough.  She was started on Spiriva last visit.  Unfortunately did not start this.  We discussed the reason for Spiriva.  Went over her recent CT chest that shows some bronchiectasis.  Autoimmune labs did show elevated rheumatoid factor and ANA, CCP was negative.  Patient has some mild arthritis but no significant joint swelling.  Sjogren's panel was negative. Typically active with light walking. Limited with back pain with scoliosis.  Lives alone. Drives. Does light housework , shopping. Has 4 kids, lots of grandkids.   11/22/2021 Follow Up PE/ Bronchiectasis/ Exertional dyspnea  [name] never smoker seen  for pulmonary consult 10/22/21 for bronchiectasis and shortness of breath. Additionally she was  found to have Pulmonary embolism on VQ scan . PE is unprovoked.  May need to consider lifelong anticoagulation or at least for 6 months to 1 year of therapy.  Will need to weigh the risk and benefit ratio.She is followed by Dr. RaChase Caller Pt. Presents for follow up . She states she has been doing well. She has been compliant with her  Eliquis. No bleeding.  She does not use NSAIDS. We are working on getting her reduced cost medication through the pharmacy. She understands she cannot skip a dose of Eliquis. .We reviewed bleeding precautions and safe use of sharp objects, in addition to fall precautions. She denies any lower extremity swelling.No bruising noted  She does continue to have exertional dyspnea.  She states she has been compliant with her Spiriva. She states she is unsure if it is helping or not, but is willing  to try this for a bit longer.    She is using her flutter valve 8-10 times a day. 2-4 blows at a time.She states she has not been able to cough anything up. We will add Mucinex to see if this helps. We discussed that this is a daily maintenance for her bronchiectasis. She is very compliant. She has had many questions today, that we have addressed.    PFT's look restrictive, which is expected with her degree of scoliosis.  DLCO with mild/Moderate reduction  Lung volumes are slightly reduce  OV 11/28/2021  Subjective:  Patient ID: Janet Nguyen, female , DOB: 1932/04/08 , age 89 y.o. , MRN: [id number] , ADDRESS: [place] [id number] PCP Janet Cruel, MD Patient Care Team: Janet Cruel, MD as PCP - General (Family Medicine) Janet Sine, MD as PCP - Cardiology (Cardiology)  This Provider for this visit: Treatment Team:  Attending Provider: Brand Males, MD    11/28/2021 -   Chief Complaint  Patient presents with   Follow-up    Pt  states she has been having problems with the cost of the Eliquis medication. States she took her last pill today 3/2. States that she has been more SOB than before.   #Multifactorial dyspnea  -Scoliosis, bronchiectasis seen on CT scan, mitral valve regurgitation grade 2 diastolic dysfunction and pulmonary embolism January 2023   #pulm embolism diagnosed January 2023 through VQ scan and high D-dimer  -On outpatient Eliquis  #Bronchiectasis diagnosis given genera 2023  -On Spiriva and flutter valve  #Right lower lobe lung nodule new 7.5 mm January 2023  -On surveillance  #Positive rheumatoid factor new diagnosis January 2023  -On surveillance   HPI VALLERIE HENTZ 89 y.o. -returns for follow-up.  I saw her in January 2023.  At that time diagnosis of pulmonary embolism was made.  She is here for follow-up.  Since then she is seen nurse practitioner.  She is taking Eliquis.  Recently today she is her last day of Eliquis.  She needs to pay for Eliquis.  It is over $500.  She is struggle to come to this decision.  Pharmacy team has been in communication with me.  We had a conversation.  At first she said she did not want to pay for the Eliquis.  It appears to have done extensive research and only warfarin would be cheap and affordable.  We went through the relative risk can inconvenience features between DOAC and Warfarin.  She then decided to go with DOAC and making the more expensive payment.  She then also discussed the difference between Eliquis and Xarelto.  She believes Xarelto could be risky compared to Eliquis.  So she decided to stick with Eliquis but she asked the pharmacy team to investigate and give her an updated information.    She does have other complaints.  She is concerned about her carotid artery.  In 2017 she might of had some mild carotid stenosis according to the ultrasound report.  I asked her to follow-up with primary care physician.  She wanted to discuss cardiac issues of  mitral valve regurgitation and grade 2 diastolic dysfunction.  Have asked her to discuss this with Dr. [name] but did indicate that it is contributing to shortness of breath.  She has positive rheumatoid factor and we said we will address it at a future visit.  She does have lung nodules and she is due for a follow-up CT in the  spring 2023.  She has bronchiectasis and she said the Spiriva and flutter valve might not be helping but for now she is going to continue those.    CT Chest data  No results found.       OV 01/24/2022  Subjective:  Patient ID: Janet Nguyen, female , DOB: 08-29-1932 , age 20 y.o. , MRN: [id number] , ADDRESS: [place] [id number] PCP Janet Cruel, MD Patient Care Team: Janet Cruel, MD as PCP - General (Family Medicine) Janet Sine, MD as PCP - Cardiology (Cardiology)  This Provider for this visit: Treatment Team:  Attending Provider: Brand Males, MD    01/24/2022 -   Chief Complaint  Patient presents with   Follow-up    Pt states she has been doing okay since last visit. States she has been in and out of the hospital since last visit.    #Multifactorial dyspnea  -Scoliosis, bronchiectasis seen on CT scan, mitral valve regurgitation grade 2 diastolic dysfunction, atrial flutter and pulmonary embolism January 2023   #pulm embolism diagnosed January 2023 through VQ scan and high D-dimer  -On outpatient Eliquis  #Bronchiectasis diagnosis given genera 2023  -On Spiriva and flutter valve  #Right lower lobe lung nodule new 7.5 mm January 2023  -On surveillance  #Positive rheumatoid factor new diagnosis January 2023  -On surveillance  HPI Janet Nguyen 89 y.o. -returns for follow-up.  She continues on Eliquis.  She is tolerating it well.  Issues that it is very expensive she is spending more than $5 per month.  She is going to work with [employer] company trying to reduce the cost.  I talked about  limiting the Eliquis to 6 months and then going on baby aspirin based on D-dimer.  However she says she wants to work with [employer] and try to reduce the cost.  She prefers to take Eliquis as long as medically indicated.  Shortness of breath wise she is stable.  She recently was admitted for diastolic dysfunction heart failure and atrial flutter in March 2023.  I reviewed the chart.  This followed a 10 pound weight gain.  She is back to feeling baseline.    Regarding her rheumatoid factor positivity: She says she does not have much of morning stiffness and joint mobility is pretty good therefore she wants to hold off on seeing rheumatologist.    Regarding emphysem and mild bronchiectasis: She is again not using the Spiriva and flutter valve.  She again tells [place] that she has not been instructed clearly.  We actually had her for a second time and had to give instructions.  She does not recommend this.  The CMA went in and showed her the devices Spiriva Respimat.  Then she remembered it.  We gave her instructions again.  She wants to try empiric Spiriva for a time-limited trial along with flutter valve and then see if this helps her shortness of breath  Lung nodule: She has not had a 15-monthCT and April 2023.  Radiologist recommended endograft 3-657-montho we will get it accordingly.        OV 04/17/2022  Subjective:  Patient ID: PaWynelle Linkfemale , DOB: 02/1932/08/12 age 89.o. , MRN: [id number] [address]: [address] [id number] RoLawerance CruelMD Patient Care Team: RoLawerance CruelMD as PCP - General (Family Medicine) KeTroy SineMD as PCP - Cardiology (Cardiology)  This Provider for this visit: Treatment Team:  Attending Provider: Brand Males, MD   04/17/2022 -   Chief Complaint  Patient presents with   Follow-up    Patient here to go over CT results.      HPI Janet Nguyen 89 y.o. -returns for follow-up.  She  continues on full dose Eliquis.  She is complaining about the cost of Eliquis.  She is wondering if this can be reduced.  She has completed 6 months of full dose treatment for pulmonary embolism.  However in the interim she has atrial fibrillation and she is on Eliquis for that.  I have return to Dr. [name] about this.  However she is continuing to have significant amount of symptoms.  She feels frustrated.  For.  She feels that nobody is listening to her and helping her address her symptoms.  However records indicate otherwise.  She also tells me that in terms of   #shortness of breath -this is not worse after the cardioversion.  She is unable to walk a few feet.  Particularly after April 2023.  Her weight was 113 pounds in April currently at 116 pounds but the weight fluctuates she states.  Also in the last 1 month  #Worsening pedal edema.  She is on Lasix and this is despite that.  She has significant varicose veins.  #Nonspecific side effects of Eliquis apart from being expensive she feels that she is "bleeding all over and she shows a body but there is no bruising or anything.  She pointed out to some discrete small macular heads of skin lesions and she says it is because of Eliquis.  #Sinus says she feels this is acting up.  She has palpitations.  She also has irritable bowel syndrome.  #Bronchiectasis she is already using a flutter valve.  She states we did not educate her currently.  Records show otherwise.  She is also not using her Spiriva.  She says she ran out.  #Lung nodule: She had CT scan of the chest shows the lung nodules resolved/very small.    CT Chest data 04/09/2022  Narrative & Impression  CLINICAL DATA:  Lung nodule.   EXAM: CT CHEST WITHOUT CONTRAST   TECHNIQUE: Multidetector CT imaging of the chest was performed following the standard protocol without IV contrast.   RADIATION DOSE REDUCTION: This exam was performed according to the departmental dose-optimization  program which includes automated exposure control, adjustment of the mA and/or kV according to patient size and/or use of iterative reconstruction technique.   COMPARISON:  10/09/2021, 07/01/2017.   FINDINGS: Cardiovascular: Atherosclerotic calcification of the aorta, aortic valve and coronary arteries. Enlarged pulmonic trunk and heart. No pericardial effusion.   Mediastinum/Nodes: Mediastinal lymph nodes are not enlarged by CT size criteria and appear similar to prior. Hilar regions are difficult to definitively evaluate without IV contrast. No axillary adenopathy. Esophagus is grossly unremarkable.   Lungs/Pleura: Scattered mucoid impaction. Central bronchiectasis. Scarring in the right middle lobe and lingula. New bilateral pleural effusions, moderate to large on the right and small to moderate on the left. Patchy ground-glass in the adjacent right lower lobe. Basilar septal thickening. Pulmonary nodules measure 4 mm or less in size, as on 10/09/2021 and likely benign. Previously described part solid nodule in right lower lobe is not visualized. Airway is unremarkable.   Upper Abdomen: Visualized portions of the liver and right adrenal gland are unremarkable. Left adrenal thickening, as before, no follow-up necessary. 1.8 cm fluid density lesion  in the upper pole left kidney, indicative of a cyst. No follow-up necessary. Visualized portions of the spleen, pancreas and stomach are unremarkable. Left lumbar hernia contains unobstructed colon.   Musculoskeletal: Degenerative changes in the spine with scoliosis. No worrisome lytic or sclerotic lesions.   IMPRESSION: 1. Previously described part solid right lower lobe nodule is not visualized. 2. Congestive heart failure. 3. Aortic atherosclerosis (ICD10-I70.0). Coronary artery calcification. 4. Enlarged pulmonic trunk, indicative of pulmonary arterial hypertension.     Electronically Signed   By: Lorin Picket M.D.    On: 04/10/2022 15:07      No results found.   OV 07/21/2022  Subjective:  Patient ID: Janet Nguyen, female , DOB: 04-Nov-1931 , age 39 y.o. , MRN: [id number] , ADDRESS: [place] [id number] PCP Janet Cruel, MD Patient Care Team: Janet Cruel, MD as PCP - General (Family Medicine) Janet Sine, MD as PCP - Cardiology (Cardiology) [place], Tami Lin, [place] as Physician Assistant (Cardiology)  This Provider for this visit: Treatment Team:  Attending Provider: Brand Males, MD    07/21/2022 -   Chief Complaint  Patient presents with   Follow-up    PT wants instructions on how to use flutter and inhaler States itchy skin, swollen legs, SOB for months Brought list of problems to discuss with Provider    HPI Janet Nguyen 89 y.o. -returns for follow-up.  In this visit she is frustrated by all her health issues.  She is frustrated that doctors are not like the way it used to be many years ago.  She feels her problems are not being addressed.  She feels she does not have a good handle on her health issues because of lack of clear-cut communication from healthcare providers.  I did remind her that all the issues have been addressed and talk to her.  She had a list of questions all of which she asked in the past and we were answered.  I did wonder about that.  It appears the fact she lives alone is causing excess distress to her because she is 89 years old.  She acknowledged that when asked her about it.  She has 2 children living in [place] but it appears that the relationship may not be tight.  There is a daughter who is nearing retirement living in [place] which checks in on her more frequently.  However the son in [place] is the healthcare power of attorney.  They have never come for the visit but she says she has appraised them.  Her current symptoms are listed below.  I told her that I believe she is  currently at baseline and the best way forward is not expected to go of her condition but she needs to learn to accept what Is permanent baseline symptoms and the fact some of the symptoms might not be reversible.  She needs to learn to recognize variation and then report on the variation.  Based on that she fill out the [facility] symptom assessment score.  Also gave her a sheet to take home so she can monitor her status.  In terms of her shortness of breath this is stable  In terms of anticoagulation she is taking Eliquis.  It is now at low-dose in September 2023.  She wants to know if she can stop it.  I told her January 2024 it will be a year since pulmonary embolism and she can probably stop it  but she has atrial flutter and therefore the long-term decision will now depend on Dr. [name]  She is worried about recurrent epistaxis because of the Eliquis.  She had severe epistaxis many years ago and ended up in the emergency room.  Some weeks ago she had 1 small bout of epistaxis versus oral bleeding.  She brought a pillow covered to show that.  Since then it has not recurred.  I told her if it recurs she can call us back or Dr. [name].   In terms of her pedal edema: The right lower extremity is more swollen than the left.  Did explain to her the multifactorial nature of this.  She is not interested in getting a Doppler of the leg.  It is better than before and therefore we opted to watch this   Lung nodule: She had a previous lung nodule that resolved.  She did find out the area where she has been living for 34 years there is increased rate on exposure.  At this point in time we resolved just to monitor this.       OV 10/30/2022  Subjective:  Patient ID: Janet Nguyen, female , DOB: Feb 07, 1932 , age 45 y.o. , MRN: [id number] , ADDRESS: [place] [id number] PCP Janet Cruel, MD Patient Care Team: Janet Cruel, MD as PCP - General (Family Medicine) Janet Sine, MD as PCP - Cardiology (Cardiology) [place], Tami Lin, [place] as Physician Assistant (Cardiology)  This Provider for this visit: Treatment Team:  Attending Provider: Brand Males, MD    10/30/2022 -   Chief Complaint  Patient presents with   Follow-up    C/o R lower leg and foot swelling.  Thinks Eliquis is causing this.  SOB has improved over last year.  Does not remember ever taking Spiriva Respimat.     #Multifactorial dyspnea  -Scoliosis, bronchiectasis seen on CT scan, mitral valve regurgitation grade 2 diastolic dysfunction, atrial flutter and pulmonary embolism January 2023   #pulm embolism diagnosed January 2023 through VQ scan and high D-dimer [normal Dopplers] #Atrial flutter managed by Dr. [name]  -On outpatient Eliquis -since January 2023: Switch to low-dose Eliquis September 2023 for risk mitigation  #Bronchiectasis diagnosis given genera 2023   - Spiriva samples given earlier in the year 2023 but did not use due to cost and lack of efficacy  -Flutter valve recommended but only with sporadic resultant use.  #Radon exposure #Right lower lobe lung nodule new 7.5 mm January 2023 -there is a summer 2023  -On surveillance/expectant follow-up  #Positive rheumatoid factor new diagnosis January 2023  -On surveillance  #Remote history of epistaxis resulting in emergency room visit posing a concern for patient on Eliquis  #Chronic pedal edema with decompensation summer 2023 right greater than left -Long history of varicose veins and cor pulmonale considered etiologies  HPI LEANETTE EUTSLER 89 y.o. -returns for follow-up.  She tells me that she is actually doing better.  She is completed 1 year of Eliquis with low-dose in September 2023.  All her EKGs on external record review with Dr. Shelva Majestic since August 2023 through December 2023 still show atrial fibrillation.  Her most recent visit with Dr. [name] was in December 2023 and she still had atrial  fibrillation.  She really wants to come off the Eliquis.  She is completed 1 year for a pulmonary embolism treatment but she now needs it for her atrial fibrillation.  I did indicate to  her that she can stop her Eliquis from a pulmonary embolism standpoint but she needs it for atrial fibrillation because of stroke risk and she will have to discuss this with Dr. [name].  I did indicate to her that she is only on a low-dose but she feels there is bruising on her back and easy bruising.  She showed me some pictures.  She believes it is coming from Eliquis.  I have deferred the indication for Eliquis to Dr. Shelva Majestic.  From a shortness of breath standpoint she is much better she just gets fatigued because of aging she feels.  There is no cough or wheezing.  She is only on supportive care.  She has bronchiectasis but does not do Spiriva.  She feels it is at risk for cancer but we have kept CT scan follow-up on an expectant approach.  Because she is feeling better she actually wants to switch to expectant follow-up.  I am supportive of this.  She did appreciate me for the care.  She also giving feedback to have actually improved with my bedside manners in the last 1 year.  She feels physicians need to spend more time.  We are seeing her 30-minute slots and this has helped her outpatient satisfaction.       [place] Symptom Assessment Numerical Scale 0 is no problem -> 10 worst problem 07/21/2022    No Pain -> Worst pain 0  No Tiredness -> Worset tiredness 4  No Nausea -> Worst nausea 2  No Depression -> Worst depression 8  No Anxiety -> Worst Anxiety 8  No Drowsiness -> Worst Drowsiness 8  Best appetite-> Worst Appetitle 4  Best Feeling of well being -> Worst feeling 8  No dyspnea-> Worst dyspnea 2  Other problem (none -> severe) RLE > LLE Edema  Othre issues Epistaxis mild x 1  Completed by  patoient        PFT     Latest Ref Rng & Units 10/22/2021   11:26 AM  PFT Results  FVC-Pre L  1.79   FVC-Predicted Pre % 89   FVC-Post L 1.78   FVC-Predicted Post % 89   Pre FEV1/FVC % % 73   Post FEV1/FCV % % 76   FEV1-Pre L 1.30   FEV1-Predicted Pre % 89   FEV1-Post L 1.36   DLCO uncorrected ml/min/mmHg 10.42   DLCO UNC% % 61   DLCO corrected ml/min/mmHg 10.42   DLCO COR %Predicted % 61   DLVA Predicted % 84   TLC L 4.21   TLC % Predicted % 89   RV % Predicted % 101        has a past medical history of Arrhythmia, Atrial flutter (HCC), Celiac disease, GERD (gastroesophageal reflux disease), Hypertension, Intervertebral disc stenosis of neural canal of cervical region, Irregular heart beat (11/30/2012), Osteoporosis, PMR (polymyalgia rheumatica) ([place]), Scoliosis, Scoliosis, and Sleep apnea (10/02/11 [facility]).   reports that she has never smoked. She has never used smokeless tobacco.  Past Surgical History:  Procedure Laterality Date   APPENDECTOMY     ruptured at age 27 and had surgery   CARDIAC CATHETERIZATION  01/27/06   CARDIOVERSION N/A 01/08/2022   Procedure: CARDIOVERSION;  Surgeon: Freada Bergeron, MD;  Location: [facility] ENDOSCOPY;  Service: Cardiovascular;  Laterality: N/A;   cataract surgery  2015   Dr. Herbert Deaner; March & April 2015    Allergies  Allergen Reactions   Gluten Meal Other (See Comments)  Unknown   Naproxen Other (See Comments)    Stomach upset    Immunization History  Administered Date(s) Administered   Fluad Quad(high Dose 65+) 07/11/2022   Influenza Split 06/26/2014   Influenza, High Dose Seasonal PF 06/15/2015, 06/19/2016, 07/24/2017, 05/24/2019, 06/20/2021   Influenza,inj,quad, With Preservative 06/29/2018   Influenza-Unspecified 06/30/2013   PFIZER(Purple Top)SARS-COV-2 Vaccination 10/14/2019, 11/04/2019, 07/18/2020, 07/01/2021   Pneumococcal Conjugate-13 03/09/2014   Tdap 03/21/2009, 05/29/2021    Family History  Problem Relation Age of Onset   Breast cancer Mother    Heart disease Father    Migraines Neg  Hx      Current Outpatient Medications:    acetaminophen (TYLENOL) 500 MG tablet, Take 500 mg by mouth every 8 (eight) hours as needed for moderate pain., Disp: , Rfl:    Aloe-Sodium Chloride (AYR SALINE NASAL GEL NA), Place 1 application  into the nose daily as needed (rhinitis)., Disp: , Rfl:    ALPRAZolam (XANAX) 0.5 MG tablet, Take 0.25-0.5 mg by mouth 2 (two) times daily as needed for anxiety., Disp: , Rfl:    apixaban (ELIQUIS) 2.5 MG TABS tablet, Take 1 tablet (2.5 mg total) by mouth 2 (two) times daily., Disp: 180 tablet, Rfl: 3   Biotin w/ Vitamins C & E (HAIR SKIN & NAILS GUMMIES PO), Take 1 tablet by mouth daily., Disp: , Rfl:    Calcium Carb-Ergocalciferol (CHEWABLE CALCIUM/D PO), Take 1 each by mouth daily., Disp: , Rfl:    digoxin (LANOXIN) 0.125 MG tablet, TAKE 1/2 TABLET BY MOUTH DAILY, Disp: 15 tablet, Rfl: 3   diltiazem (CARDIZEM CD) 120 MG 24 hr capsule, Take 1 capsule (120 mg total) by mouth every morning., Disp: , Rfl:    metoprolol succinate (TOPROL-XL) 50 MG 24 hr tablet, TAKE 1 AND 1/2 TABLETS BY MOUTH TWO TIMES A DAY, Disp: 270 tablet, Rfl: 3   Metoprolol Tartrate 75 MG TABS, Take 1 tablet twice a day by oral route., Disp: , Rfl:    Phenylephrine-APAP-guaiFENesin (EQ SINUS CONGESTION & PAIN PO), Take 1 tablet by mouth daily as needed (for sinus pain). Contains acetaminophen 325 mg and Phenylephrine 5 mg, Disp: , Rfl:    polyethylene glycol powder (GLYCOLAX/MIRALAX) 17 GM/SCOOP powder, Take 17 g by mouth daily as needed for moderate constipation., Disp: , Rfl:    potassium chloride (KLOR-CON M) 10 MEQ tablet, Take 1 tablet (10 mEq total) by mouth daily., Disp: 90 tablet, Rfl: 3   zolpidem (AMBIEN) 5 MG tablet, Take 2.5 mg by mouth at bedtime., Disp: , Rfl:    apixaban (ELIQUIS) 5 MG TABS tablet, Take 1 tablet (5 mg total) by mouth 2 (two) times daily. (Patient not taking: Reported on 10/30/2022), Disp: 42 tablet, Rfl: 0   clidinium-chlordiazePOXIDE (LIBRAX) 5-2.5 MG capsule,  Take 1 capsule by mouth daily as needed. (Patient not taking: Reported on 10/30/2022), Disp: 60 capsule, Rfl: 3   fluticasone (FLONASE) 50 MCG/ACT nasal spray, Place 1 spray into both nostrils daily as needed for allergies or rhinitis. (Patient not taking: Reported on 10/30/2022), Disp: , Rfl:    mirtazapine (REMERON) 15 MG tablet, Take 15 mg by mouth at bedtime. (Patient not taking: Reported on 10/30/2022), Disp: , Rfl:    Tiotropium Bromide Monohydrate (SPIRIVA RESPIMAT) 1.25 MCG/ACT AERS, Inhale 2 puffs into the lungs daily. (Patient not taking: Reported on 10/30/2022), Disp: 4 g, Rfl: 5   torsemide (DEMADEX) 20 MG tablet, Take 1 tablet (20 mg total) by mouth 2 (two) times daily. (Patient not taking: Reported  on 10/30/2022), Disp: 90 tablet, Rfl: 3      Objective:   Vitals:   10/30/22 1112  BP: 100/64  Pulse: 88  Temp: 97.6 F (36.4 C)  TempSrc: Oral  SpO2: 98%  Weight: 111 lb 9.6 oz (50.6 kg)  Height: '5\' 1"'$  (1.549 m)    Estimated body mass index is 21.09 kg/m as calculated from the following:   Height as of this encounter: '5\' 1"'$  (1.549 m).   Weight as of this encounter: 111 lb 9.6 oz (50.6 kg).  '@WEIGHTCHANGE'$ @  Filed Weights   10/30/22 1112  Weight: 111 lb 9.6 oz (50.6 kg)     Physical Exam  General: No distress. Looks well Neuro: Alert and Oriented x 3. GCS 15. Speech normal Psych: Pleasant Resp:  Barrel Chest - no.  Wheeze - no, Crackles - no. SCOLIOSIS +, No overt respiratory distress CVS: Normal heart sounds. Murmurs - n Ext: Stigmata of Connective Tissue Disease - ono HEENT: Normal upper airway. PEERL +. No post nasal drip        Assessment:       ICD-10-CM   1. DOE (dyspnea on exertion)  R06.09     2. Bronchiectasis without complication ([place])  T41.9     3. Pulmonary embolism, other, unspecified chronicity, unspecified whether acute cor pulmonale present ([place])  I26.99          Plan:     Patient Instructions  DOE (dyspnea on exertion)   - PE in Jan  2023, scoliosis, bronchiectasis, mitral valve leak and and grade 2 heart muscle stiffness, atrial flutter contributing to respiratory symptoms - now better after completing 1 year eliquis - your current issues causing shortness of breath is fixed and NOT reversible   Plan -Supportive care - Address individual components below  #Pulmonary embolism diagnosed January 2023  - completed 1 year of total eliquis  with low dose Sept 2023-Jan 2024  Plan  -- From PE perspective you do need eliquis but you probably need it for A Fibritllation - talk to Dr [name] about need to continue eliquis v stop eliquis    #Bronchiectasis  -Spiriva sample months ago did not help and was too expensive - currently doing well  Plan  - Flutter valve 5 times daily - No need for spiriva  Right lower lobe lung nodule 7.85m on CT 10/09/21 - new. Resolved nodule July 2023 Radon Exposure x 34 years - newly discovered   Plan  - CT only if clinical need   #history of sleep apnea with prior intolerance to cPAP in 2015-2017  Plan  = expectant followup  #Pedeal Edema R > Left  since summer 2023  - due to varicose veins and heart and lung issues. Improved Jan 2024  Plan  - management by PCP RLawerance Cruel MD and cardiology   Positive rheumatoid factor January 2023  - shared decisio  in July 2023 making we wil follow expectantly since joints are okay   Plan  - expectant followup   Followup - return  as needed  (Level 04: Estb 30-39 min visit type: on-site physical face to visit visit spent in total care time and counseling or/and coordination of care by this undersigned MD - Dr MBrand Nguyen This includes one or more of the following on this same day 10/30/2022: pre-charting, chart review, note writing, documentation discussion of test results, diagnostic or treatment recommendations, prognosis, risks and benefits of management options, instructions, education, compliance or risk-factor  reduction. It excludes  time spent by the [place] or office staff in the care of the patient . Actual time is 30 min)   SIGNATURE    Dr. Brand Nguyen, M.D., F.C.C.P,  Pulmonary and Critical Care Medicine Staff Physician, [place] Director - Interstitial Lung Disease  Program  [facility] [place], [place], [address]  Pager: [phone], If no answer or between  15:00h - 7:00h: call [phone] Telephone: [phone]  11:36 AM 10/30/2022

## 2022-10-30 NOTE — Patient Instructions (Addendum)
DOE (dyspnea on exertion)   - PE in Jan 2023, scoliosis, bronchiectasis, mitral valve leak and and grade 2 heart muscle stiffness, atrial flutter contributing to respiratory symptoms - now better after completing 1 year eliquis - your current issues causing shortness of breath is fixed and NOT reversible   Plan -Supportive care - Address individual components below  #Pulmonary embolism diagnosed January 2023  - completed 1 year of total eliquis  with low dose Sept 2023-Jan 2024  Plan  -- From PE perspective you do need eliquis but you probably need it for A Fibritllation - talk to Dr [name] about need to continue eliquis v stop eliquis    #Bronchiectasis  -Spiriva sample months ago did not help and was too expensive - currently doing well  Plan  - Flutter valve 5 times daily - No need for spiriva  Right lower lobe lung nodule 7.32m on CT 10/09/21 - new. Resolved nodule July 2023 Radon Exposure x 34 years - newly discovered   Plan  - CT only if clinical need   #history of sleep apnea with prior intolerance to cPAP in 2015-2017  Plan  = expectant followup  #Pedeal Edema R > Left  since summer 2023  - due to varicose veins and heart and lung issues. Improved Jan 2024  Plan  - management by PCP RLawerance Cruel MD and cardiology   Positive rheumatoid factor January 2023  - shared decisio  in July 2023 making we wil follow expectantly since joints are okay   Plan  - expectant followup   Followup - return  as needed

## 2022-11-05 ENCOUNTER — Encounter: Payer: Self-pay | Admitting: Physical Therapy

## 2022-11-05 ENCOUNTER — Ambulatory Visit: Payer: Medicare Other | Attending: Family Medicine | Admitting: Physical Therapy

## 2022-11-05 DIAGNOSIS — M542 Cervicalgia: Secondary | ICD-10-CM | POA: Diagnosis not present

## 2022-11-05 DIAGNOSIS — M4125 Other idiopathic scoliosis, thoracolumbar region: Secondary | ICD-10-CM | POA: Diagnosis not present

## 2022-11-05 DIAGNOSIS — M6281 Muscle weakness (generalized): Secondary | ICD-10-CM | POA: Diagnosis not present

## 2022-11-05 DIAGNOSIS — R293 Abnormal posture: Secondary | ICD-10-CM | POA: Diagnosis not present

## 2022-11-05 DIAGNOSIS — M545 Low back pain, unspecified: Secondary | ICD-10-CM | POA: Diagnosis not present

## 2022-11-05 DIAGNOSIS — M546 Pain in thoracic spine: Secondary | ICD-10-CM | POA: Insufficient documentation

## 2022-11-05 DIAGNOSIS — G8929 Other chronic pain: Secondary | ICD-10-CM | POA: Insufficient documentation

## 2022-11-05 NOTE — Therapy (Signed)
OUTPATIENT PHYSICAL THERAPY TREATMENT   Patient Name: Janet Nguyen MRN: [id number] DOB:[birth year], 89 y.o., female Today's Date: 11/05/2022   PT End of Session - 11/05/22 1145     Visit Number 19    Date for PT Re-Evaluation 12/03/22    Authorization Type Medicare Part A/B    Progress Note Due on Visit 20    PT Start Time 1145    PT Stop Time 1230    PT Time Calculation (min) 45 min    Activity Tolerance Patient tolerated treatment well;Patient limited by pain    Behavior During Therapy [facility] for tasks assessed/performed                      Past Medical History:  Diagnosis Date   Arrhythmia    History of SVT with documented PVC'S and  PAC'S  12/08/12 Nuc stress test normal LV EF 74%  Event Monitor  12/01/12-01/03/13   Atrial flutter (HCC)    Celiac disease    treated by Dr. 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 Marijean Bravo; pt states she was diagnosed 10-15 years ago, not treated at this time or any issues that she is aware of.   Scoliosis    Scoliosis    Sleep apnea 10/02/11 [facility]   Sleep study AHI -total sleep 10.3/hr  64.0/ hr during REM sleep.RDI 22.8/hr during total sleep 64.0/hr during REM sleep The lowest O2 sat during Non-REM and REM sleep was 86% and 88% respectively. 04/08/12 CPAP/BIPAP titration study [facility]   Past Surgical History:  Procedure Laterality Date   APPENDECTOMY     ruptured at age 68 and had surgery   CARDIAC CATHETERIZATION  01/27/06   CARDIOVERSION N/A 01/08/2022   Procedure: CARDIOVERSION;  Surgeon: Freada Bergeron, MD;  Location: [facility] ENDOSCOPY;  Service: Cardiovascular;  Laterality: N/A;   cataract surgery  2015   Dr. Herbert Deaner; March & April 2015   Patient Active Problem List    Diagnosis Date Noted   Secondary hypercoagulable state ([place]) 12/12/2021   Hyponatremia 12/04/2021   Acute CHF (congestive heart failure) ([place]) 12/02/2021   Acute on chronic diastolic CHF (congestive heart failure) ([place]) 12/02/2021   Atrial fibrillation with rapid ventricular response ([place]) 12/02/2021   Essential hypertension 12/02/2021   GERD without esophagitis 12/02/2021   Obstructive sleep apnea 12/02/2021   Interstitial lung disease ([place]) 12/02/2021   History of pulmonary embolism 12/02/2021   Pulmonary embolism ([place]) 11/06/2021   Bronchiectasis ([place]) 11/06/2021   Deviated septum 05/08/2020   Mass of subcutaneous tissue 05/02/2020   Vertigo 06/16/2019   Pulmonary hypertension, unspecified ([place]) 04/14/2019   Ischemic colitis ([place]) 04/14/2019   Migraine with aura and without status migrainosus, not intractable 11/08/2018   Degeneration of lumbar intervertebral disc 10/14/2018   Hoarseness of voice 03/04/2018   Abdominal pain 07/01/2017   Diverticulitis, colon    Metabolic acidosis, increased anion gap    Constipation 04/14/2017   Lumbar hernia 04/14/2017   Presbycusis of both ears 01/10/2017   Tinnitus aurium, bilateral 01/10/2017   Gastroesophageal reflux disease 08/20/2016   Hemoptysis 08/20/2016   Obstructive sleep apnea of adult 08/20/2016   Rhinitis, chronic 08/20/2016   Throat pain in adult 08/20/2016  Fatigue 12/23/2015   Sciatica of right side 10/06/2015   History of migraine headaches 10/06/2015   Frequent PVCs 12/28/2013   Premature atrial contractions 12/28/2013   PSVT (paroxysmal supraventricular tachycardia) ([place]) 12/28/2013   Heart palpitations 07/13/2013   Sleep apnea 04/11/2013   Scoliosis 04/11/2013   Atypical atrial flutter ([place]) 11/29/2012   Chest pain, atypical 11/29/2012   Fibromyalgia syndrome 11/29/2012   Chronic steroid use 11/29/2012      REFERRING PROVIDER: Lawerance Cruel, MD   REFERRING DIAG: M54.9 (ICD-10-CM) - Back pain    Rationale for Evaluation and Treatment Rehabilitation  THERAPY DIAG:  Other idiopathic scoliosis, thoracolumbar region  Cervicalgia  Muscle weakness (generalized)  Pain in thoracic spine  Abnormal posture  Chronic bilateral low back pain without sciatica  ONSET DATE: chronic LBP  SUBJECTIVE:                                                                                                                                       I hosted [place] yesterday.  I set up the day before and it wore me out.    SUBJECTIVE STATEMENT:  From evaluation: I have had a bad year.  I had PE, was hospitalized, have a swollen Rt leg, am now on Lasix, I'm in afib (cardioversion didn't stick).  Blood clot in leg was ruled out.  My leg swelling is much better than it was but still very swollen.  I am easily winded even when I'm talking.   I have lost 5 inches with my scoliosis.  I am so compressed and painful on my Lt side I have no room for my stomach so eating is very challenging.  I struggle to move my bowels.   I can only lay on my Rt side or my back.  Laying on my Rt side so much can start hurting my hip.   My neck and shoulders always hurt.   My activity level has diminished signif due to getting so easily winded.  I won't be able to do the NuStep given how winded I get.  I am trying to consistently get back into my stretches from last PT sessions.   PERTINENT HISTORY:  Pt has cardiac doctor, rheumatologist Complex medical history: of note October 25, 2021 was positive for PE, hospitalized on March 6 through December 04, 2021 with increased palpitations and was found to be in atrial fibrillation, cardioversion April 2023 was initially successful but not long term  Long history of pain from signif scoliosis, has been in/out of PT for this History of migraine headaches Sleep apnea without tolerance of CPAP, uses mouthguard  PAIN:  PAIN:  Are you having pain? Yes NPRS scale: 5/10 Pain location: neck,  back, shoulders, Rt hip Pain orientation: Right and Bilateral  PAIN TYPE: aching, throbbing, and tight Pain description: constant  Aggravating factors: activity, laying on Rt side Relieving factors: rest  PRECAUTIONS: None  WEIGHT BEARING RESTRICTIONS No  FALLS:  Has patient fallen in last 6 months? No  LIVING ENVIRONMENT: Lives with: lives alone Lives in: Other single level condo Stairs: No Has following equipment at home: None  OCCUPATION: retired  PLOF: Independent with household mobility without device and Independent with community mobility without device  PATIENT GOALS open up Lt side of trunk, work back into my stretches, less pain   OBJECTIVE:   DIAGNOSTIC FINDINGS:  No new imaging    SCREENING FOR RED FLAGS: Bowel or bladder incontinence: No Spinal tumors: No Cauda equina syndrome: No Compression fracture: No Abdominal aneurysm: No  COGNITION:  Overall cognitive status: Within functional limits for tasks assessed     SENSATION: WFL  MUSCLE LENGTH: End range bil hamstring limited Lt lat limits full Lt shoulder flexion  POSTURE:  severe scoliosis, concave on Lt with lower ribcage and pelvis approximation  PALPATION: 08/28/22: improved soft tissue and fascial mobility along Lt trunk related to short compressed side of scoliosis, ongoing rigidity of ribcage and spine; improved Rt LE edema control with compression stockings  Evaluation: Diffuse tenderness cervical, thoracic soft tissues, very tight Lt latissimus dorsi, bil pectorals, Lt obliques and QL, Rt hip Non-pitting edema in Rt leg and foot present - under care of MD and DVT ruled out per chart review, on Lasix  CERVICAL ROM:   Active  A/PROM  eval A/ROM 08/28/22 A/ROM  Flexion 57 65 65  Extension 35 35 35  Right lateral flexion '12 25 25  '$ Left lateral flexion '15 25 20  '$ Right rotation 40 35 40  Left rotation 40 35 50   (Blank rows = not tested)  LUMBAR ROM:   Active  A/PROM  eval  A/ROM 11/30  Flexion Fingers to mid shin Fingers to ankles  Extension Not tested Not tested  Right lateral flexion 5 8  Left lateral flexion 3 5  Right rotation 20% 50%  Left rotation 20% 35%   (Blank rows = not tested)  LOWER EXTREMITY ROM:     Passive  Right eval Left eval Right 11/30 Left 11/30  Hip flexion      Hip extension      Hip abduction      Hip adduction      Hip internal rotation 35 40 35 45  Hip external rotation 50 60 60 65  Knee flexion      Knee extension      Ankle dorsiflexion      Ankle plantarflexion      Ankle inversion      Ankle eversion       (Blank rows = not tested)  LOWER EXTREMITY MMT:     11/30: 4/5 bil LE Eval: 4/5 bil LE   GAIT: Distance walked: within clinic Assistive device utilized: None Level of assistance: Complete Independence Comments: gets very winded    TODAY'S TREATMENT  Date: 11/05/22 NuStep L1 x 5' PT present to discuss status - able to maintain conversation throughout  Seated STM Lt upper quadrant, bil cervical paraspinals and intrascapular region Rt SL paraspinal broadening pelvis to shoulders bil  Date: 10/29/22 NuStep L1 x 5' PT present to discuss status Seated STM Lt upper quadrant, bil cervical paraspinals and intrascapular region Rt SL paraspinal broadening pelvis to shoulders bil  Date: 10/22/22 NuStep L2 x 5' PT present to discuss status and review goals - Pt able to converse throughout therex Objective measures (see above) Seated upper traps and cervical paraspinal STM bil  Rt SL paraspinal broadening pelvis to shoulders bil, Lt obliques and QL Verbal review of goals, HEP  PATIENT EDUCATION:  Education details: discussed plan of care and goals Person educated: Patient Education method: Explanation Education comprehension: verbalized understanding   HOME EXERCISE PROGRAM: Access Code: [email or web address] URL: [email or web address] Date: 08/07/2022 Prepared by: Venetia Night Aryssa Rosamond  Exercises -  Standing Row with Anchored Resistance  - 1 x daily - 7 x weekly - 2 sets - 10 reps - Standing Shoulder Extension with Resistance  - 1 x daily - 7 x weekly - 2 sets - 10 reps - Standing Shoulder External Rotation with Resistance  - 1 x daily - 7 x weekly - 2 sets - 10 reps - Standing Shoulder Horizontal Abduction with Resistance  - 1 x daily - 7 x weekly - 2 sets - 10 reps  ASSESSMENT:  CLINICAL IMPRESSION: Pt was able to host mahjong group yesterday requiring her to set up and clean the day before.  She arrived fatigued from this but was glad to have felt well enough to do so.  She continues to feel improved enough to do more light gardening, cooking and social gatherings.  She continues to be able to converse throughout cardio time x 5' demo'ing improving cardiovascular endurance.  She continues to benefit from skilled manual techniques to minimize function and maximize mobility for improved tolerance of daily activities.    OBJECTIVE IMPAIRMENTS decreased activity tolerance, decreased endurance, decreased mobility, decreased ROM, decreased strength, hypomobility, increased fascial restrictions, increased muscle spasms, impaired flexibility, impaired UE functional use, improper body mechanics, postural dysfunction, and pain.   ACTIVITY LIMITATIONS carrying, lifting, bending, standing, sleeping, transfers, and reach over head  PARTICIPATION LIMITATIONS: cleaning, laundry, driving, shopping, community activity, and yard work  PERSONAL FACTORS Age and Time since onset of injury/illness/exacerbation are also affecting patient's functional outcome.   REHAB POTENTIAL: Good  CLINICAL DECISION MAKING: Stable/uncomplicated  EVALUATION COMPLEXITY: Low   GOALS: Goals reviewed with patient? Yes  SHORT TERM GOALS: Target date: 07/31/22  Pt will be ind with initial HEP for trunk and LE mobility and stretching Baseline: Goal status: met    LONG TERM GOALS: Target date: 12/03/22  Pt will be ind  with strategies for symptom management including pacing, spinal decompression, positioning, and HEP. Baseline:  Goal status: met  2.  Pt will improve bil hamstring length to WNL to reduce back pain Baseline:  Goal status: met  3.  Pt will be able to perform light daily tasks within home for up to 30 min before needing a positional rest break. Baseline:  Goal status: met for some days of the week, ongoing  4.  Pt will achieve improved soft tissue mobility of Lt side of trunk and bil cervical region to reduce pain and decompress abdominal organs Baseline:  Goal status: met  5.  Improve neck rotation to at least 50 deg for improved visibility with driving. Baseline: 40 deg bil Goal status: ongoing, regressed to 35 deg on 11/30  6. Pt will be able to perform NuStep for up to 8 min while holding conversation to demo improved cardiovascular endurance.  Baseline: 4-5'  Goal status: new     PLAN: PT FREQUENCY: taper to 1x/week  PT DURATION: 6 weeks  PLANNED INTERVENTIONS: Therapeutic exercises, Therapeutic activity, Neuromuscular re-education, Balance training, Patient/Family education, Self Care, Joint mobilization, Aquatic Therapy, Dry Needling, Electrical stimulation, Spinal mobilization, Cryotherapy, Moist heat, and Manual therapy.  PLAN FOR NEXT SESSION: NuStep, work towards consistent routine for HEP,  conitnue manual therapy for ease of motion and release of tissues  Shron Ozer, PT 11/05/22 12:50 PM

## 2022-11-06 ENCOUNTER — Ambulatory Visit: Payer: Medicare Other | Attending: Physician Assistant | Admitting: Physician Assistant

## 2022-11-06 ENCOUNTER — Encounter: Payer: Self-pay | Admitting: Physician Assistant

## 2022-11-06 VITALS — BP 124/82 | HR 77 | Ht 60.0 in | Wt 112.2 lb

## 2022-11-06 DIAGNOSIS — I5032 Chronic diastolic (congestive) heart failure: Secondary | ICD-10-CM | POA: Diagnosis not present

## 2022-11-06 DIAGNOSIS — I493 Ventricular premature depolarization: Secondary | ICD-10-CM | POA: Diagnosis not present

## 2022-11-06 DIAGNOSIS — Z7901 Long term (current) use of anticoagulants: Secondary | ICD-10-CM | POA: Insufficient documentation

## 2022-11-06 DIAGNOSIS — I4819 Other persistent atrial fibrillation: Secondary | ICD-10-CM | POA: Diagnosis not present

## 2022-11-06 DIAGNOSIS — I471 Supraventricular tachycardia, unspecified: Secondary | ICD-10-CM | POA: Insufficient documentation

## 2022-11-06 DIAGNOSIS — G4733 Obstructive sleep apnea (adult) (pediatric): Secondary | ICD-10-CM | POA: Insufficient documentation

## 2022-11-06 DIAGNOSIS — I2699 Other pulmonary embolism without acute cor pulmonale: Secondary | ICD-10-CM | POA: Insufficient documentation

## 2022-11-06 LAB — MAGNESIUM: Magnesium: 2.2 mg/dL (ref 1.6–2.3)

## 2022-11-06 LAB — BASIC METABOLIC PANEL
BUN/Creatinine Ratio: 16 (ref 12–28)
BUN: 17 mg/dL (ref 8–27)
CO2: 28 mmol/L (ref 20–29)
Calcium: 9.4 mg/dL (ref 8.7–10.3)
Chloride: 95 mmol/L — ABNORMAL LOW (ref 96–106)
Creatinine, Ser: 1.08 mg/dL — ABNORMAL HIGH (ref 0.57–1.00)
Glucose: 96 mg/dL (ref 70–99)
Potassium: 4.6 mmol/L (ref 3.5–5.2)
Sodium: 135 mmol/L (ref 134–144)
eGFR: 49 mL/min/{1.73_m2} — ABNORMAL LOW (ref >59)

## 2022-11-06 MED ORDER — APIXABAN 2.5 MG PO TABS: 2.5000 mg | ORAL_TABLET | Freq: Two times a day (BID) | ORAL | 3 refills | Status: DC

## 2022-11-06 MED ORDER — TORSEMIDE 20 MG PO TABS: 30.0000 mg | ORAL_TABLET | Freq: Two times a day (BID) | ORAL | 3 refills | Status: DC

## 2022-11-06 NOTE — Patient Instructions (Addendum)
Medication Instructions:  Torsemide from 20 mg to 30 mg ( Take 1 Tablet in The Morning and 1/2 Tablet 10 mg in the Evening). *If you need a refill on your cardiac medications before your next appointment, please call your pharmacy*   Lab Work: BMET, Magnesium today If you have labs (blood work) drawn today and your tests are completely normal, you will receive your results only by: [place] (if you have MyChart) OR A paper copy in the mail If you have any lab test that is abnormal or we need to change your treatment, we will call you to review the results.   Testing/Procedures: No Testing   Follow-Up: At [facility], you and your health needs are our priority.  As part of our continuing mission to provide you with exceptional heart care, we have created designated Provider Care Teams.  These Care Teams include your primary Cardiologist (physician) and Advanced Practice Providers (APPs -  Physician Assistants and Nurse Practitioners) who all work together to provide you with the care you need, when you need it.  We recommend signing up for the patient portal called "MyChart".  Sign up information is provided on this After Visit Summary.  MyChart is used to connect with patients for Virtual Visits (Telemedicine).  Patients are able to view lab/test results, encounter notes, upcoming appointments, etc.  Non-urgent messages can be sent to your provider as well.   To learn more about what you can do with MyChart, go to [email or web address].    Your next appointment:   4 month(s)  Provider:   Fabian Sharp, PA-C

## 2022-11-11 DIAGNOSIS — I4891 Unspecified atrial fibrillation: Secondary | ICD-10-CM | POA: Diagnosis not present

## 2022-11-11 DIAGNOSIS — I509 Heart failure, unspecified: Secondary | ICD-10-CM | POA: Diagnosis not present

## 2022-11-11 DIAGNOSIS — K219 Gastro-esophageal reflux disease without esophagitis: Secondary | ICD-10-CM | POA: Diagnosis not present

## 2022-11-11 DIAGNOSIS — M810 Age-related osteoporosis without current pathological fracture: Secondary | ICD-10-CM | POA: Diagnosis not present

## 2022-11-11 DIAGNOSIS — F320 Major depressive disorder, single episode, mild: Secondary | ICD-10-CM | POA: Diagnosis not present

## 2022-11-11 DIAGNOSIS — I10 Essential (primary) hypertension: Secondary | ICD-10-CM | POA: Diagnosis not present

## 2022-11-11 DIAGNOSIS — I272 Pulmonary hypertension, unspecified: Secondary | ICD-10-CM | POA: Diagnosis not present

## 2022-11-12 ENCOUNTER — Encounter: Payer: Medicare Other | Admitting: Physical Therapy

## 2022-11-13 ENCOUNTER — Encounter: Payer: Self-pay | Admitting: Physical Therapy

## 2022-11-13 ENCOUNTER — Ambulatory Visit: Payer: Medicare Other | Admitting: Physical Therapy

## 2022-11-13 DIAGNOSIS — R293 Abnormal posture: Secondary | ICD-10-CM | POA: Diagnosis not present

## 2022-11-13 DIAGNOSIS — M6281 Muscle weakness (generalized): Secondary | ICD-10-CM

## 2022-11-13 DIAGNOSIS — G8929 Other chronic pain: Secondary | ICD-10-CM

## 2022-11-13 DIAGNOSIS — M546 Pain in thoracic spine: Secondary | ICD-10-CM | POA: Diagnosis not present

## 2022-11-13 DIAGNOSIS — M542 Cervicalgia: Secondary | ICD-10-CM | POA: Diagnosis not present

## 2022-11-13 DIAGNOSIS — M545 Low back pain, unspecified: Secondary | ICD-10-CM | POA: Diagnosis not present

## 2022-11-13 DIAGNOSIS — M4125 Other idiopathic scoliosis, thoracolumbar region: Secondary | ICD-10-CM | POA: Diagnosis not present

## 2022-11-13 NOTE — Therapy (Signed)
OUTPATIENT PHYSICAL THERAPY TREATMENT  Progress Note Reporting Period 09/03/23 to 11/13/22  See note below for Objective Data and Assessment of Progress/Goals.      Patient Name: Janet Nguyen MRN: [id number] DOB:Jan 20, 1932, 89 y.o., female Today's Date: 11/13/2022   PT End of Session - 11/13/22 1053     Visit Number 20    Date for PT Re-Evaluation 12/03/22    Authorization Type Medicare Part A/B    Progress Note Due on Visit 30    PT Start Time 1055    PT Stop Time 1140    PT Time Calculation (min) 45 min    Activity Tolerance Patient tolerated treatment well;Patient limited by pain    Behavior During Therapy [facility] for tasks assessed/performed                       Past Medical History:  Diagnosis Date   Arrhythmia    History of SVT with documented PVC'S and  PAC'S  12/08/12 Nuc stress test normal LV EF 74%  Event Monitor  12/01/12-01/03/13   Atrial flutter (HCC)    Celiac disease    treated by Dr. 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 Marijean Bravo; pt states she was diagnosed 10-15 years ago, not treated at this time or any issues that she is aware of.   Scoliosis    Scoliosis    Sleep apnea 10/02/11 [facility]   Sleep study AHI -total sleep 10.3/hr  64.0/ hr during REM sleep.RDI 22.8/hr during total sleep 64.0/hr during REM sleep The lowest O2 sat during Non-REM and REM sleep was 86% and 88% respectively. 04/08/12 CPAP/BIPAP titration study [facility]   Past Surgical History:  Procedure Laterality Date   APPENDECTOMY     ruptured at age 46 and had surgery   CARDIAC CATHETERIZATION  01/27/06   CARDIOVERSION N/A 01/08/2022   Procedure: CARDIOVERSION;  Surgeon: Freada Bergeron, MD;  Location: [facility] ENDOSCOPY;  Service:  Cardiovascular;  Laterality: N/A;   cataract surgery  2015   Dr. Herbert Deaner; March & April 2015   Patient Active Problem List   Diagnosis Date Noted   Secondary hypercoagulable state ([place]) 12/12/2021   Hyponatremia 12/04/2021   Acute CHF (congestive heart failure) ([place]) 12/02/2021   Acute on chronic diastolic CHF (congestive heart failure) ([place]) 12/02/2021   Atrial fibrillation with rapid ventricular response ([place]) 12/02/2021   Essential hypertension 12/02/2021   GERD without esophagitis 12/02/2021   Obstructive sleep apnea 12/02/2021   Interstitial lung disease ([place]) 12/02/2021   History of pulmonary embolism 12/02/2021   Pulmonary embolism ([place]) 11/06/2021   Bronchiectasis ([place]) 11/06/2021   Deviated septum 05/08/2020   Mass of subcutaneous tissue 05/02/2020   Vertigo 06/16/2019   Pulmonary hypertension, unspecified ([place]) 04/14/2019   Ischemic colitis ([place]) 04/14/2019   Migraine with aura and without status migrainosus, not intractable 11/08/2018   Degeneration of lumbar intervertebral disc 10/14/2018   Hoarseness of voice 03/04/2018   Abdominal pain 07/01/2017   Diverticulitis, colon    Metabolic acidosis, increased anion gap    Constipation 04/14/2017   Lumbar hernia 04/14/2017   Presbycusis of both ears 01/10/2017   Tinnitus aurium, bilateral 01/10/2017   Gastroesophageal reflux disease 08/20/2016  Hemoptysis 08/20/2016   Obstructive sleep apnea of adult 08/20/2016   Rhinitis, chronic 08/20/2016   Throat pain in adult 08/20/2016   Fatigue 12/23/2015   Sciatica of right side 10/06/2015   History of migraine headaches 10/06/2015   Frequent PVCs 12/28/2013   Premature atrial contractions 12/28/2013   PSVT (paroxysmal supraventricular tachycardia) ([place]) 12/28/2013   Heart palpitations 07/13/2013   Sleep apnea 04/11/2013   Scoliosis 04/11/2013   Atypical atrial flutter ([place]) 11/29/2012   Chest pain, atypical 11/29/2012   Fibromyalgia syndrome 11/29/2012   Chronic  steroid use 11/29/2012      REFERRING PROVIDER: Lawerance Cruel, MD   REFERRING DIAG: M54.9 (ICD-10-CM) - Back pain   Rationale for Evaluation and Treatment Rehabilitation  THERAPY DIAG:  Other idiopathic scoliosis, thoracolumbar region  Cervicalgia  Muscle weakness (generalized)  Pain in thoracic spine  Abnormal posture  Chronic bilateral low back pain without sciatica  ONSET DATE: chronic LBP  SUBJECTIVE:                                                                                                                                       I am feeling weak today for some reason.  SUBJECTIVE STATEMENT:  From evaluation: I have had a bad year.  I had PE, was hospitalized, have a swollen Rt leg, am now on Lasix, I'm in afib (cardioversion didn't stick).  Blood clot in leg was ruled out.  My leg swelling is much better than it was but still very swollen.  I am easily winded even when I'm talking.   I have lost 5 inches with my scoliosis.  I am so compressed and painful on my Lt side I have no room for my stomach so eating is very challenging.  I struggle to move my bowels.   I can only lay on my Rt side or my back.  Laying on my Rt side so much can start hurting my hip.   My neck and shoulders always hurt.   My activity level has diminished signif due to getting so easily winded.  I won't be able to do the NuStep given how winded I get.  I am trying to consistently get back into my stretches from last PT sessions.   PERTINENT HISTORY:  Pt has cardiac doctor, rheumatologist Complex medical history: of note October 25, 2021 was positive for PE, hospitalized on March 6 through December 04, 2021 with increased palpitations and was found to be in atrial fibrillation, cardioversion April 2023 was initially successful but not long term  Long history of pain from signif scoliosis, has been in/out of PT for this History of migraine headaches Sleep apnea without tolerance of CPAP, uses  mouthguard  PAIN:  PAIN:  Are you having pain? Yes NPRS scale: 5/10 Pain location: neck, back, shoulders, Rt hip Pain orientation: Right and Bilateral  PAIN TYPE: aching, throbbing, and tight Pain description:  constant  Aggravating factors: activity, laying on Rt side Relieving factors: rest    PRECAUTIONS: None  WEIGHT BEARING RESTRICTIONS No  FALLS:  Has patient fallen in last 6 months? No  LIVING ENVIRONMENT: Lives with: lives alone Lives in: Other single level condo Stairs: No Has following equipment at home: None  OCCUPATION: retired  PLOF: Independent with household mobility without device and Independent with community mobility without device  PATIENT GOALS open up Lt side of trunk, work back into my stretches, less pain   OBJECTIVE:   DIAGNOSTIC FINDINGS:  No new imaging    SCREENING FOR RED FLAGS: Bowel or bladder incontinence: No Spinal tumors: No Cauda equina syndrome: No Compression fracture: No Abdominal aneurysm: No  COGNITION:  Overall cognitive status: Within functional limits for tasks assessed     SENSATION: WFL  MUSCLE LENGTH: 11/13/22: improving hamstring length bil, improved lat length allowing greater Rt UE overhead reaching with less pain  End range bil hamstring limited Lt lat limits full Lt shoulder flexion  POSTURE:  severe scoliosis, concave on Lt with lower ribcage and pelvis approximation  PALPATION: 11/13/22: improved sensitivity and mobility of fascia and soft tissues along Lt trunk, short side of scoliosis, improved tenderness throughout paraspinals neck to pelvis,  improved Rt LE edema control with compression stockings  08/28/22: improved soft tissue and fascial mobility along Lt trunk related to short compressed side of scoliosis, ongoing rigidity of ribcage and spine; improved Rt LE edema control with compression stockings  Evaluation: Diffuse tenderness cervical, thoracic soft tissues, very tight Lt latissimus  dorsi, bil pectorals, Lt obliques and QL, Rt hip Non-pitting edema in Rt leg and foot present - under care of MD and DVT ruled out per chart review, on Lasix  CERVICAL ROM:   Active  A/PROM  eval A/ROM 08/28/22 A/ROM 11/13/22  Flexion 57 65 65  Extension 35 35 35  Right lateral flexion 12 25 25  $ Left lateral flexion 15 25 25  $ Right rotation 40 35 40  Left rotation 40 35 50   (Blank rows = not tested)  LUMBAR ROM:   Active  A/PROM  eval A/ROM 11/30 A/ROM 11/13/22  Flexion Fingers to mid shin Fingers to ankles Fingers to ankles no pain  Extension Not tested Not tested NT  Right lateral flexion 5 8 10  $ Left lateral flexion 3 5 8  $ Right rotation 20% 50% 50%  Left rotation 20% 35% 45%   (Blank rows = not tested)  LOWER EXTREMITY ROM:     Passive  Right eval Left eval Right 11/30 Left 11/30 Right 2/15 Left 2/15  Hip flexion        Hip extension        Hip abduction        Hip adduction        Hip internal rotation 35 40 35 45 40 45  Hip external rotation 50 60 60 65 65 70  Knee flexion        Knee extension        Ankle dorsiflexion        Ankle plantarflexion        Ankle inversion        Ankle eversion         (Blank rows = not tested)  LOWER EXTREMITY MMT:     11/13/22: 4+/5 bil LE 11/30: 4/5 bil LE Eval: 4/5 bil LE   GAIT: Distance walked: within clinic Assistive device utilized: None Level of assistance: Complete Independence  Comments: gets very winded    TODAY'S TREATMENT  Date: 11/13/22  NuStep L1 x 5' PT present to discuss status - able to maintain conversation throughout  Spinal decompression x2'  Supine hamstring stretch 2x30" with ankle circles and pumps SKTC alt LE 3x10" each DKTC rocking side to side 2x30" Fig 4 stretch 2x30" bil Rt SL broadening to obliques, QL and paraspinals cervical to pelvis Seated upper quadrant STM bil Seated cervical rotation A/ROM 10x5" holds alt Rt/Lt Upper trap stretch bil 3x30"  Date: 11/05/22 NuStep L1 x  5' PT present to discuss status - able to maintain conversation throughout  Seated STM Lt upper quadrant, bil cervical paraspinals and intrascapular region Rt SL paraspinal broadening pelvis to shoulders bil  Date: 10/29/22 NuStep L1 x 5' PT present to discuss status Seated STM Lt upper quadrant, bil cervical paraspinals and intrascapular region Rt SL paraspinal broadening pelvis to shoulders bil  PATIENT EDUCATION:  Education details: discussed plan of care and goals Person educated: Patient Education method: Explanation Education comprehension: verbalized understanding   HOME EXERCISE PROGRAM: Access Code: [email or web address] URL: [email or web address] Date: 08/07/2022 Prepared by: Venetia Night Ota Ebersole  Exercises - Standing Row with Anchored Resistance  - 1 x daily - 7 x weekly - 2 sets - 10 reps - Standing Shoulder Extension with Resistance  - 1 x daily - 7 x weekly - 2 sets - 10 reps - Standing Shoulder External Rotation with Resistance  - 1 x daily - 7 x weekly - 2 sets - 10 reps - Standing Shoulder Horizontal Abduction with Resistance  - 1 x daily - 7 x weekly - 2 sets - 10 reps  ASSESSMENT:  CLINICAL IMPRESSION: Pt has had improved QOL and activity tolerance with PT.  She has been doing more cooking, attending social events, and getting outside in her gardens.  She continues to have chronic pain related to severe degenerative scoliosis, some days worse than others.  She lives alone and expresses feelings of isolation and lack of family support, which seems to contribute to her overall pain experience.  She continues to benefit from skilled manual techniques to minimize function and maximize mobility for improved tolerance of daily activities.    OBJECTIVE IMPAIRMENTS decreased activity tolerance, decreased endurance, decreased mobility, decreased ROM, decreased strength, hypomobility, increased fascial restrictions, increased muscle spasms, impaired flexibility, impaired UE  functional use, improper body mechanics, postural dysfunction, and pain.   ACTIVITY LIMITATIONS carrying, lifting, bending, standing, sleeping, transfers, and reach over head  PARTICIPATION LIMITATIONS: cleaning, laundry, driving, shopping, community activity, and yard work  PERSONAL FACTORS Age and Time since onset of injury/illness/exacerbation are also affecting patient's functional outcome.   REHAB POTENTIAL: Good  CLINICAL DECISION MAKING: Stable/uncomplicated  EVALUATION COMPLEXITY: Low   GOALS: Goals reviewed with patient? Yes  SHORT TERM GOALS: Target date: 07/31/22  Pt will be ind with initial HEP for trunk and LE mobility and stretching Baseline: Goal status: met    LONG TERM GOALS: Target date: 12/03/22  Pt will be ind with strategies for symptom management including pacing, spinal decompression, positioning, and HEP. Baseline:  Goal status: met  2.  Pt will improve bil hamstring length to WNL to reduce back pain Baseline:  Goal status: met  3.  Pt will be able to perform light daily tasks within home for up to 30 min before needing a positional rest break. Baseline:  Goal status: met for some days of the week, ongoing  4.  Pt will achieve improved soft tissue mobility  of Lt side of trunk and bil cervical region to reduce pain and decompress abdominal organs Baseline:  Goal status: met  5.  Improve neck rotation to at least 50 deg for improved visibility with driving. Baseline: 40 deg bil Goal status: ongoing, regressed to 35 deg on 11/30  6. Pt will be able to perform NuStep for up to 8 min while holding conversation to demo improved cardiovascular endurance.  Baseline: 4-5'  Goal status: new     PLAN: PT FREQUENCY: taper to 1x/week  PT DURATION: 6 weeks  PLANNED INTERVENTIONS: Therapeutic exercises, Therapeutic activity, Neuromuscular re-education, Balance training, Patient/Family education, Self Care, Joint mobilization, Aquatic Therapy, Dry  Needling, Electrical stimulation, Spinal mobilization, Cryotherapy, Moist heat, and Manual therapy.  PLAN FOR NEXT SESSION: NuStep, work towards consistent routine for HEP, conitnue manual therapy for ease of motion and release of tissues  Michaline Kindig, PT 11/13/22 1:33 PM

## 2022-11-14 ENCOUNTER — Encounter: Payer: Self-pay | Admitting: *Deleted

## 2022-11-19 ENCOUNTER — Encounter: Payer: Self-pay | Admitting: Physical Therapy

## 2022-11-19 ENCOUNTER — Ambulatory Visit: Payer: Medicare Other | Admitting: Physical Therapy

## 2022-11-19 DIAGNOSIS — R293 Abnormal posture: Secondary | ICD-10-CM

## 2022-11-19 DIAGNOSIS — M542 Cervicalgia: Secondary | ICD-10-CM

## 2022-11-19 DIAGNOSIS — M545 Low back pain, unspecified: Secondary | ICD-10-CM | POA: Diagnosis not present

## 2022-11-19 DIAGNOSIS — M546 Pain in thoracic spine: Secondary | ICD-10-CM

## 2022-11-19 DIAGNOSIS — M4125 Other idiopathic scoliosis, thoracolumbar region: Secondary | ICD-10-CM | POA: Diagnosis not present

## 2022-11-19 DIAGNOSIS — M6281 Muscle weakness (generalized): Secondary | ICD-10-CM | POA: Diagnosis not present

## 2022-11-19 DIAGNOSIS — G8929 Other chronic pain: Secondary | ICD-10-CM

## 2022-11-19 NOTE — Therapy (Signed)
OUTPATIENT PHYSICAL THERAPY TREATMENT       Patient Name: Janet Nguyen MRN: [id number] DOB:08/06/1932, 89 y.o., female Today's Date: 11/19/2022   PT End of Session - 11/19/22 1058     Visit Number 21    Date for PT Re-Evaluation 12/03/22    Authorization Type Medicare Part A/B    Progress Note Due on Visit 61    PT Start Time 1059    PT Stop Time 1143    PT Time Calculation (min) 44 min    Activity Tolerance Patient tolerated treatment well;Patient limited by pain    Behavior During Therapy [facility] for tasks assessed/performed                        Past Medical History:  Diagnosis Date   Arrhythmia    History of SVT with documented PVC'S and  PAC'S  12/08/12 Nuc stress test normal LV EF 74%  Event Monitor  12/01/12-01/03/13   Atrial flutter (HCC)    Celiac disease    treated by Dr. 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 Marijean Bravo; pt states she was diagnosed 10-15 years ago, not treated at this time or any issues that she is aware of.   Scoliosis    Scoliosis    Sleep apnea 10/02/11 [facility]   Sleep study AHI -total sleep 10.3/hr  64.0/ hr during REM sleep.RDI 22.8/hr during total sleep 64.0/hr during REM sleep The lowest O2 sat during Non-REM and REM sleep was 86% and 88% respectively. 04/08/12 CPAP/BIPAP titration study [facility]   Past Surgical History:  Procedure Laterality Date   APPENDECTOMY     ruptured at age 52 and had surgery   CARDIAC CATHETERIZATION  01/27/06   CARDIOVERSION N/A 01/08/2022   Procedure: CARDIOVERSION;  Surgeon: Freada Bergeron, MD;  Location: [facility] ENDOSCOPY;  Service: Cardiovascular;  Laterality: N/A;   cataract surgery  2015   Dr. Herbert Deaner; March & April 2015   Patient Active  Problem List   Diagnosis Date Noted   Secondary hypercoagulable state ([place]) 12/12/2021   Hyponatremia 12/04/2021   Acute CHF (congestive heart failure) ([place]) 12/02/2021   Acute on chronic diastolic CHF (congestive heart failure) ([place]) 12/02/2021   Atrial fibrillation with rapid ventricular response ([place]) 12/02/2021   Essential hypertension 12/02/2021   GERD without esophagitis 12/02/2021   Obstructive sleep apnea 12/02/2021   Interstitial lung disease ([place]) 12/02/2021   History of pulmonary embolism 12/02/2021   Pulmonary embolism ([place]) 11/06/2021   Bronchiectasis ([place]) 11/06/2021   Deviated septum 05/08/2020   Mass of subcutaneous tissue 05/02/2020   Vertigo 06/16/2019   Pulmonary hypertension, unspecified ([place]) 04/14/2019   Ischemic colitis ([place]) 04/14/2019   Migraine with aura and without status migrainosus, not intractable 11/08/2018   Degeneration of lumbar intervertebral disc 10/14/2018   Hoarseness of voice 03/04/2018   Abdominal pain 07/01/2017   Diverticulitis, colon    Metabolic acidosis, increased anion gap    Constipation 04/14/2017   Lumbar hernia 04/14/2017   Presbycusis of both ears 01/10/2017   Tinnitus aurium, bilateral 01/10/2017   Gastroesophageal reflux disease 08/20/2016   Hemoptysis 08/20/2016   Obstructive sleep apnea of adult 08/20/2016   Rhinitis, chronic 08/20/2016  Throat pain in adult 08/20/2016   Fatigue 12/23/2015   Sciatica of right side 10/06/2015   History of migraine headaches 10/06/2015   Frequent PVCs 12/28/2013   Premature atrial contractions 12/28/2013   PSVT (paroxysmal supraventricular tachycardia) (HCC) 12/28/2013   Heart palpitations 07/13/2013   Sleep apnea 04/11/2013   Scoliosis 04/11/2013   Atypical atrial flutter ([place]) 11/29/2012   Chest pain, atypical 11/29/2012   Fibromyalgia syndrome 11/29/2012   Chronic steroid use 11/29/2012      REFERRING PROVIDER: Lawerance Cruel, MD   REFERRING DIAG: M54.9 (ICD-10-CM) -  Back pain   Rationale for Evaluation and Treatment Rehabilitation  THERAPY DIAG:  Other idiopathic scoliosis, thoracolumbar region  Muscle weakness (generalized)  Cervicalgia  Pain in thoracic spine  Abnormal posture  Chronic bilateral low back pain without sciatica  ONSET DATE: chronic LBP  SUBJECTIVE:                                                                                                                                       I had to go to ED after last visit due to an inflammed skin area in my low back.  A cream was given to me and it got better over a few days. It burned, itched and was beet red.  SUBJECTIVE STATEMENT:  From evaluation: I have had a bad year.  I had PE, was hospitalized, have a swollen Rt leg, am now on Lasix, I'm in afib (cardioversion didn't stick).  Blood clot in leg was ruled out.  My leg swelling is much better than it was but still very swollen.  I am easily winded even when I'm talking.   I have lost 5 inches with my scoliosis.  I am so compressed and painful on my Lt side I have no room for my stomach so eating is very challenging.  I struggle to move my bowels.   I can only lay on my Rt side or my back.  Laying on my Rt side so much can start hurting my hip.   My neck and shoulders always hurt.   My activity level has diminished signif due to getting so easily winded.  I won't be able to do the NuStep given how winded I get.  I am trying to consistently get back into my stretches from last PT sessions.   PERTINENT HISTORY:  Pt has cardiac doctor, rheumatologist Complex medical history: of note October 25, 2021 was positive for PE, hospitalized on March 6 through December 04, 2021 with increased palpitations and was found to be in atrial fibrillation, cardioversion April 2023 was initially successful but not long term  Long history of pain from signif scoliosis, has been in/out of PT for this History of migraine headaches Sleep apnea without  tolerance of CPAP, uses mouthguard  PAIN:  PAIN:  Are you having pain? Yes NPRS scale: 5/10 Pain location: neck, back, shoulders,  Rt hip Pain orientation: Right and Bilateral  PAIN TYPE: aching, throbbing, and tight Pain description: constant  Aggravating factors: activity, laying on Rt side Relieving factors: rest    PRECAUTIONS: None  WEIGHT BEARING RESTRICTIONS No  FALLS:  Has patient fallen in last 6 months? No  LIVING ENVIRONMENT: Lives with: lives alone Lives in: Other single level condo Stairs: No Has following equipment at home: None  OCCUPATION: retired  PLOF: Independent with household mobility without device and Independent with community mobility without device  PATIENT GOALS open up Lt side of trunk, work back into my stretches, less pain   OBJECTIVE:   DIAGNOSTIC FINDINGS:  No new imaging    SCREENING FOR RED FLAGS: Bowel or bladder incontinence: No Spinal tumors: No Cauda equina syndrome: No Compression fracture: No Abdominal aneurysm: No  COGNITION:  Overall cognitive status: Within functional limits for tasks assessed     SENSATION: WFL  MUSCLE LENGTH: 11/13/22: improving hamstring length bil, improved lat length allowing greater Rt UE overhead reaching with less pain  End range bil hamstring limited Lt lat limits full Lt shoulder flexion  POSTURE:  severe scoliosis, concave on Lt with lower ribcage and pelvis approximation  PALPATION: 11/13/22: improved sensitivity and mobility of fascia and soft tissues along Lt trunk, short side of scoliosis, improved tenderness throughout paraspinals neck to pelvis,  improved Rt LE edema control with compression stockings  08/28/22: improved soft tissue and fascial mobility along Lt trunk related to short compressed side of scoliosis, ongoing rigidity of ribcage and spine; improved Rt LE edema control with compression stockings  Evaluation: Diffuse tenderness cervical, thoracic soft tissues, very  tight Lt latissimus dorsi, bil pectorals, Lt obliques and QL, Rt hip Non-pitting edema in Rt leg and foot present - under care of MD and DVT ruled out per chart review, on Lasix  CERVICAL ROM:   Active  A/PROM  eval A/ROM 08/28/22 A/ROM 11/13/22  Flexion 57 65 65  Extension 35 35 35  Right lateral flexion 12 25 25  $ Left lateral flexion 15 25 25  $ Right rotation 40 35 40  Left rotation 40 35 50   (Blank rows = not tested)  LUMBAR ROM:   Active  A/PROM  eval A/ROM 11/30 A/ROM 11/13/22  Flexion Fingers to mid shin Fingers to ankles Fingers to ankles no pain  Extension Not tested Not tested NT  Right lateral flexion 5 8 10  $ Left lateral flexion 3 5 8  $ Right rotation 20% 50% 50%  Left rotation 20% 35% 45%   (Blank rows = not tested)  LOWER EXTREMITY ROM:     Passive  Right eval Left eval Right 11/30 Left 11/30 Right 2/15 Left 2/15  Hip flexion        Hip extension        Hip abduction        Hip adduction        Hip internal rotation 35 40 35 45 40 45  Hip external rotation 50 60 60 65 65 70  Knee flexion        Knee extension        Ankle dorsiflexion        Ankle plantarflexion        Ankle inversion        Ankle eversion         (Blank rows = not tested)  LOWER EXTREMITY MMT:     11/13/22: 4+/5 bil LE 11/30: 4/5 bil LE Eval: 4/5 bil LE  GAIT: Distance walked: within clinic Assistive device utilized: None Level of assistance: Complete Independence Comments: gets very winded    TODAY'S TREATMENT  Date: 11/19/22 NuStep L1 x 5'  Spinal decompression x2'  Supine hamstring stretch 2x30" with ankle circles and pumps SKTC alt LE 3x10" each DKTC rocking side to side 2x30" Fig 4 stretch 2x30" bil Rt SL broadening to obliques, QL and paraspinals cervical to pelvis Seated upper quadrant STM bil Seated cervical rotation A/ROM 10x5" holds alt Rt/Lt Upper trap stretch bil 3x30"  Date: 11/13/22  NuStep L1 x 5' PT present to discuss status - able to maintain  conversation throughout  Spinal decompression x2'  Supine hamstring stretch 2x30" with ankle circles and pumps SKTC alt LE 3x10" each DKTC rocking side to side 2x30" Fig 4 stretch 2x30" bil Rt SL broadening to obliques, QL and paraspinals cervical to pelvis Seated upper quadrant STM bil Seated cervical rotation A/ROM 10x5" holds alt Rt/Lt Upper trap stretch bil 3x30"  Date: 11/05/22 NuStep L1 x 5' PT present to discuss status - able to maintain conversation throughout  Seated STM Lt upper quadrant, bil cervical paraspinals and intrascapular region Rt SL paraspinal broadening pelvis to shoulders bil  PATIENT EDUCATION:  Education details: discussed plan of care and goals Person educated: Patient Education method: Explanation Education comprehension: verbalized understanding   HOME EXERCISE PROGRAM: Access Code: [email or web address] URL: [email or web address] Date: 08/07/2022 Prepared by: Venetia Night Cuyler Vandyken  Exercises - Standing Row with Anchored Resistance  - 1 x daily - 7 x weekly - 2 sets - 10 reps - Standing Shoulder Extension with Resistance  - 1 x daily - 7 x weekly - 2 sets - 10 reps - Standing Shoulder External Rotation with Resistance  - 1 x daily - 7 x weekly - 2 sets - 10 reps - Standing Shoulder Horizontal Abduction with Resistance  - 1 x daily - 7 x weekly - 2 sets - 10 reps  ASSESSMENT:  CLINICAL IMPRESSION: Pt had a local skin flare up along spine after last visit.  After discussion PT and Pt agreed it may have actually been a superficial skin burn as Pt sat with heating pad for over an hour after therapy at home.  Pt greatly benefits from stretching combined with STM to address chronic pain related to scoliosis.  She is trying to figure out a soft surface she can use other than her bed to do some stretching at home more consistently.  Pt is on track to meet goals by end of certification period.   OBJECTIVE IMPAIRMENTS decreased activity tolerance, decreased  endurance, decreased mobility, decreased ROM, decreased strength, hypomobility, increased fascial restrictions, increased muscle spasms, impaired flexibility, impaired UE functional use, improper body mechanics, postural dysfunction, and pain.   ACTIVITY LIMITATIONS carrying, lifting, bending, standing, sleeping, transfers, and reach over head  PARTICIPATION LIMITATIONS: cleaning, laundry, driving, shopping, community activity, and yard work  PERSONAL FACTORS Age and Time since onset of injury/illness/exacerbation are also affecting patient's functional outcome.   REHAB POTENTIAL: Good  CLINICAL DECISION MAKING: Stable/uncomplicated  EVALUATION COMPLEXITY: Low   GOALS: Goals reviewed with patient? Yes  SHORT TERM GOALS: Target date: 07/31/22  Pt will be ind with initial HEP for trunk and LE mobility and stretching Baseline: Goal status: met    LONG TERM GOALS: Target date: 12/03/22  Pt will be ind with strategies for symptom management including pacing, spinal decompression, positioning, and HEP. Baseline:  Goal status: met  2.  Pt will improve bil hamstring length to  WNL to reduce back pain Baseline:  Goal status: met  3.  Pt will be able to perform light daily tasks within home for up to 30 min before needing a positional rest break. Baseline:  Goal status: met for some days of the week, ongoing  4.  Pt will achieve improved soft tissue mobility of Lt side of trunk and bil cervical region to reduce pain and decompress abdominal organs Baseline:  Goal status: met  5.  Improve neck rotation to at least 50 deg for improved visibility with driving. Baseline: 40 deg bil Goal status: ongoing, regressed to 35 deg on 11/30  6. Pt will be able to perform NuStep for up to 8 min while holding conversation to demo improved cardiovascular endurance.  Baseline: 4-5'  Goal status: new     PLAN: PT FREQUENCY: taper to 1x/week  PT DURATION: 6 weeks  PLANNED INTERVENTIONS:  Therapeutic exercises, Therapeutic activity, Neuromuscular re-education, Balance training, Patient/Family education, Self Care, Joint mobilization, Aquatic Therapy, Dry Needling, Electrical stimulation, Spinal mobilization, Cryotherapy, Moist heat, and Manual therapy.  PLAN FOR NEXT SESSION: NuStep, work towards consistent routine for HEP, conitnue manual therapy for ease of motion and release of tissues  Alexsis Branscom, PT 11/19/22 12:31 PM

## 2022-11-24 DIAGNOSIS — R8290 Unspecified abnormal findings in urine: Secondary | ICD-10-CM | POA: Diagnosis not present

## 2022-11-24 DIAGNOSIS — Z6821 Body mass index (BMI) 21.0-21.9, adult: Secondary | ICD-10-CM | POA: Diagnosis not present

## 2022-11-24 DIAGNOSIS — R682 Dry mouth, unspecified: Secondary | ICD-10-CM | POA: Diagnosis not present

## 2022-11-24 DIAGNOSIS — J479 Bronchiectasis, uncomplicated: Secondary | ICD-10-CM | POA: Diagnosis not present

## 2022-11-24 DIAGNOSIS — M25572 Pain in left ankle and joints of left foot: Secondary | ICD-10-CM | POA: Diagnosis not present

## 2022-11-24 DIAGNOSIS — R58 Hemorrhage, not elsewhere classified: Secondary | ICD-10-CM | POA: Diagnosis not present

## 2022-11-26 ENCOUNTER — Encounter: Payer: Self-pay | Admitting: Physical Therapy

## 2022-11-26 ENCOUNTER — Ambulatory Visit: Payer: Medicare Other | Admitting: Physical Therapy

## 2022-11-26 DIAGNOSIS — G8929 Other chronic pain: Secondary | ICD-10-CM

## 2022-11-26 DIAGNOSIS — M542 Cervicalgia: Secondary | ICD-10-CM

## 2022-11-26 DIAGNOSIS — M545 Low back pain, unspecified: Secondary | ICD-10-CM | POA: Diagnosis not present

## 2022-11-26 DIAGNOSIS — M546 Pain in thoracic spine: Secondary | ICD-10-CM | POA: Diagnosis not present

## 2022-11-26 DIAGNOSIS — R293 Abnormal posture: Secondary | ICD-10-CM

## 2022-11-26 DIAGNOSIS — M6281 Muscle weakness (generalized): Secondary | ICD-10-CM

## 2022-11-26 DIAGNOSIS — M4125 Other idiopathic scoliosis, thoracolumbar region: Secondary | ICD-10-CM

## 2022-11-26 NOTE — Therapy (Signed)
OUTPATIENT PHYSICAL THERAPY TREATMENT       Patient Name: Janet Nguyen MRN: [id number] DOB:Oct 29, 1931, 89 y.o., female Today's Date: 11/26/2022   PT End of Session - 11/26/22 1059     Visit Number 22    Date for PT Re-Evaluation 12/03/22    Authorization Type Medicare Part A/B    Progress Note Due on Visit 30    PT Start Time 1100    PT Stop Time 1145    PT Time Calculation (min) 45 min    Activity Tolerance Patient tolerated treatment well;Patient limited by pain    Behavior During Therapy [facility] for tasks assessed/performed                         Past Medical History:  Diagnosis Date   Arrhythmia    History of SVT with documented PVC'S and  PAC'S  12/08/12 Nuc stress test normal LV EF 74%  Event Monitor  12/01/12-01/03/13   Atrial flutter (HCC)    Celiac disease    treated by Dr. 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 Marijean Bravo; pt states she was diagnosed 10-15 years ago, not treated at this time or any issues that she is aware of.   Scoliosis    Scoliosis    Sleep apnea 10/02/11 [facility]   Sleep study AHI -total sleep 10.3/hr  64.0/ hr during REM sleep.RDI 22.8/hr during total sleep 64.0/hr during REM sleep The lowest O2 sat during Non-REM and REM sleep was 86% and 88% respectively. 04/08/12 CPAP/BIPAP titration study [facility]   Past Surgical History:  Procedure Laterality Date   APPENDECTOMY     ruptured at age 4 and had surgery   CARDIAC CATHETERIZATION  01/27/06   CARDIOVERSION N/A 01/08/2022   Procedure: CARDIOVERSION;  Surgeon: Freada Bergeron, MD;  Location: [facility] ENDOSCOPY;  Service: Cardiovascular;  Laterality: N/A;   cataract surgery  2015   Dr. Herbert Deaner; March & April 2015   Patient Active  Problem List   Diagnosis Date Noted   Secondary hypercoagulable state ([place]) 12/12/2021   Hyponatremia 12/04/2021   Acute CHF (congestive heart failure) ([place]) 12/02/2021   Acute on chronic diastolic CHF (congestive heart failure) ([place]) 12/02/2021   Atrial fibrillation with rapid ventricular response ([place]) 12/02/2021   Essential hypertension 12/02/2021   GERD without esophagitis 12/02/2021   Obstructive sleep apnea 12/02/2021   Interstitial lung disease ([place]) 12/02/2021   History of pulmonary embolism 12/02/2021   Pulmonary embolism ([place]) 11/06/2021   Bronchiectasis ([place]) 11/06/2021   Deviated septum 05/08/2020   Mass of subcutaneous tissue 05/02/2020   Vertigo 06/16/2019   Pulmonary hypertension, unspecified ([place]) 04/14/2019   Ischemic colitis ([place]) 04/14/2019   Migraine with aura and without status migrainosus, not intractable 11/08/2018   Degeneration of lumbar intervertebral disc 10/14/2018   Hoarseness of voice 03/04/2018   Abdominal pain 07/01/2017   Diverticulitis, colon    Metabolic acidosis, increased anion gap    Constipation 04/14/2017   Lumbar hernia 04/14/2017   Presbycusis of both ears 01/10/2017   Tinnitus aurium, bilateral 01/10/2017   Gastroesophageal reflux disease 08/20/2016   Hemoptysis 08/20/2016   Obstructive sleep apnea of adult 08/20/2016   Rhinitis, chronic 08/20/2016  Throat pain in adult 08/20/2016   Fatigue 12/23/2015   Sciatica of right side 10/06/2015   History of migraine headaches 10/06/2015   Frequent PVCs 12/28/2013   Premature atrial contractions 12/28/2013   PSVT (paroxysmal supraventricular tachycardia) (HCC) 12/28/2013   Heart palpitations 07/13/2013   Sleep apnea 04/11/2013   Scoliosis 04/11/2013   Atypical atrial flutter ([place]) 11/29/2012   Chest pain, atypical 11/29/2012   Fibromyalgia syndrome 11/29/2012   Chronic steroid use 11/29/2012      REFERRING PROVIDER: Lawerance Cruel, MD   REFERRING DIAG: M54.9 (ICD-10-CM) -  Back pain   Rationale for Evaluation and Treatment Rehabilitation  THERAPY DIAG:  Other idiopathic scoliosis, thoracolumbar region  Muscle weakness (generalized)  Pain in thoracic spine  Cervicalgia  Abnormal posture  Chronic bilateral low back pain without sciatica  ONSET DATE: chronic LBP  SUBJECTIVE:                                                                                                                                       I am not well today.  I need to skip the NuStep today.  It hasn't been a good week.  A few days ago I woke up with Lt ankle swelling and redness - it was over the weekend.  I had to use a cane to walk it was so painful.  I saw the doctor and he thinks gout.  It is better today.  I have so many parts of my body I'm concerned about.  SUBJECTIVE STATEMENT:  From evaluation: I have had a bad year.  I had PE, was hospitalized, have a swollen Rt leg, am now on Lasix, I'm in afib (cardioversion didn't stick).  Blood clot in leg was ruled out.  My leg swelling is much better than it was but still very swollen.  I am easily winded even when I'm talking.   I have lost 5 inches with my scoliosis.  I am so compressed and painful on my Lt side I have no room for my stomach so eating is very challenging.  I struggle to move my bowels.   I can only lay on my Rt side or my back.  Laying on my Rt side so much can start hurting my hip.   My neck and shoulders always hurt.   My activity level has diminished signif due to getting so easily winded.  I won't be able to do the NuStep given how winded I get.  I am trying to consistently get back into my stretches from last PT sessions.   PERTINENT HISTORY:  Pt has cardiac doctor, rheumatologist Complex medical history: of note October 25, 2021 was positive for PE, hospitalized on March 6 through December 04, 2021 with increased palpitations and was found to be in atrial fibrillation, cardioversion April 2023 was initially  successful but not long term  Long history of pain from signif  scoliosis, has been in/out of PT for this History of migraine headaches Sleep apnea without tolerance of CPAP, uses mouthguard  PAIN:  PAIN:  Are you having pain? Yes NPRS scale: 6/10 Pain location: neck, back, shoulders, Rt hip Pain orientation: Right and Bilateral  PAIN TYPE: aching, throbbing, and tight Pain description: constant  Aggravating factors: activity, laying on Rt side Relieving factors: rest    PRECAUTIONS: None  WEIGHT BEARING RESTRICTIONS No  FALLS:  Has patient fallen in last 6 months? No  LIVING ENVIRONMENT: Lives with: lives alone Lives in: Other single level condo Stairs: No Has following equipment at home: None  OCCUPATION: retired  PLOF: Independent with household mobility without device and Independent with community mobility without device  PATIENT GOALS open up Lt side of trunk, work back into my stretches, less pain   OBJECTIVE:   DIAGNOSTIC FINDINGS:  No new imaging    SCREENING FOR RED FLAGS: Bowel or bladder incontinence: No Spinal tumors: No Cauda equina syndrome: No Compression fracture: No Abdominal aneurysm: No  COGNITION:  Overall cognitive status: Within functional limits for tasks assessed     SENSATION: WFL  MUSCLE LENGTH: 11/13/22: improving hamstring length bil, improved lat length allowing greater Rt UE overhead reaching with less pain  End range bil hamstring limited Lt lat limits full Lt shoulder flexion  POSTURE:  severe scoliosis, concave on Lt with lower ribcage and pelvis approximation  PALPATION: 11/13/22: improved sensitivity and mobility of fascia and soft tissues along Lt trunk, short side of scoliosis, improved tenderness throughout paraspinals neck to pelvis,  improved Rt LE edema control with compression stockings  08/28/22: improved soft tissue and fascial mobility along Lt trunk related to short compressed side of scoliosis, ongoing  rigidity of ribcage and spine; improved Rt LE edema control with compression stockings  Evaluation: Diffuse tenderness cervical, thoracic soft tissues, very tight Lt latissimus dorsi, bil pectorals, Lt obliques and QL, Rt hip Non-pitting edema in Rt leg and foot present - under care of MD and DVT ruled out per chart review, on Lasix  CERVICAL ROM:   Active  A/PROM  eval A/ROM 08/28/22 A/ROM 11/13/22  Flexion 57 65 65  Extension 35 35 35  Right lateral flexion '12 25 25  '$ Left lateral flexion '15 25 25  '$ Right rotation 40 35 40  Left rotation 40 35 50   (Blank rows = not tested)  LUMBAR ROM:   Active  A/PROM  eval A/ROM 11/30 A/ROM 11/13/22  Flexion Fingers to mid shin Fingers to ankles Fingers to ankles no pain  Extension Not tested Not tested NT  Right lateral flexion '5 8 10  '$ Left lateral flexion '3 5 8  '$ Right rotation 20% 50% 50%  Left rotation 20% 35% 45%   (Blank rows = not tested)  LOWER EXTREMITY ROM:     Passive  Right eval Left eval Right 11/30 Left 11/30 Right 2/15 Left 2/15  Hip flexion        Hip extension        Hip abduction        Hip adduction        Hip internal rotation 35 40 35 45 40 45  Hip external rotation 50 60 60 65 65 70  Knee flexion        Knee extension        Ankle dorsiflexion        Ankle plantarflexion        Ankle inversion  Ankle eversion         (Blank rows = not tested)  LOWER EXTREMITY MMT:     11/13/22: 4+/5 bil LE 11/30: 4/5 bil LE Eval: 4/5 bil LE   GAIT: Distance walked: within clinic Assistive device utilized: None Level of assistance: Complete Independence Comments: gets very winded    TODAY'S TREATMENT  Date: 11/26/22 Self-care: Pt wanted to discuss recent visit with PCP, acute flare up of Rt medial ankle swelling, and intermittent history of Lt hand numbness.  Discussed the plan her PCP put into place for ankle swelling origin diagnosis (awaiting blood work and prescribed meds for gout), discussed how  pattern of numbness in hand can help understand where origin of numbness is coming from - MD has encouraged her to keep a diary for history of numbness since Pt is unsure of distribution, discussed Pt concerns about global arthritis and joint pains/joint noise and how gentle consistent movement is important, muscle atrophy with aging causing weight loss and weakness discussed to reiterate importance of HEP Rt SL broadening to obliques, QL and paraspinals cervical to pelvis Seated upper quadrant STM bil  Date: 11/19/22 NuStep L1 x 5'  Spinal decompression x2'  Supine hamstring stretch 2x30" with ankle circles and pumps SKTC alt LE 3x10" each DKTC rocking side to side 2x30" Fig 4 stretch 2x30" bil Rt SL broadening to obliques, QL and paraspinals cervical to pelvis Seated upper quadrant STM bil Seated cervical rotation A/ROM 10x5" holds alt Rt/Lt Upper trap stretch bil 3x30"  Date: 11/13/22  NuStep L1 x 5' PT present to discuss status - able to maintain conversation throughout  Spinal decompression x2'  Supine hamstring stretch 2x30" with ankle circles and pumps SKTC alt LE 3x10" each DKTC rocking side to side 2x30" Fig 4 stretch 2x30" bil Rt SL broadening to obliques, QL and paraspinals cervical to pelvis Seated upper quadrant STM bil Seated cervical rotation A/ROM 10x5" holds alt Rt/Lt Upper trap stretch bil 3x30"   PATIENT EDUCATION:  Education details: discussed plan of care and goals Person educated: Patient Education method: Explanation Education comprehension: verbalized understanding   HOME EXERCISE PROGRAM: Access Code: [email or web address] URL: [email or web address] Date: 08/07/2022 Prepared by: Venetia Night Shourya Macpherson  Exercises - Standing Row with Anchored Resistance  - 1 x daily - 7 x weekly - 2 sets - 10 reps - Standing Shoulder Extension with Resistance  - 1 x daily - 7 x weekly - 2 sets - 10 reps - Standing Shoulder External Rotation with Resistance  - 1 x daily - 7  x weekly - 2 sets - 10 reps - Standing Shoulder Horizontal Abduction with Resistance  - 1 x daily - 7 x weekly - 2 sets - 10 reps  ASSESSMENT:  CLINICAL IMPRESSION: Pt arrived with lots of questions about her PCP visit during which she asked many questions about varied body symptoms (see treatment note above).  Long discussion x20 min beginning of session per Pt request to review her questions and concerns.  She feels she doesn't have any family support from her grown children and this has her feeling down.  Pt does continue to display improved soft tissue mobility but is limited in ROM secondary to severe degenerative scoliosis.  She has some days that are better than others for activity tolerance.  She has intermittently (on better days) been able to increase cooking, yard work, Education administrator, social activities, and outdoor ambulation for exercise.  She continues to benefit from stretching combined with STM to address chronic pain  related to scoliosis.  Pt is on track to meet goals by end of certification period.   OBJECTIVE IMPAIRMENTS decreased activity tolerance, decreased endurance, decreased mobility, decreased ROM, decreased strength, hypomobility, increased fascial restrictions, increased muscle spasms, impaired flexibility, impaired UE functional use, improper body mechanics, postural dysfunction, and pain.   ACTIVITY LIMITATIONS carrying, lifting, bending, standing, sleeping, transfers, and reach over head  PARTICIPATION LIMITATIONS: cleaning, laundry, driving, shopping, community activity, and yard work  PERSONAL FACTORS Age and Time since onset of injury/illness/exacerbation are also affecting patient's functional outcome.   REHAB POTENTIAL: Good  CLINICAL DECISION MAKING: Stable/uncomplicated  EVALUATION COMPLEXITY: Low   GOALS: Goals reviewed with patient? Yes  SHORT TERM GOALS: Target date: 07/31/22  Pt will be ind with initial HEP for trunk and LE mobility and  stretching Baseline: Goal status: met    LONG TERM GOALS: Target date: 12/03/22  Pt will be ind with strategies for symptom management including pacing, spinal decompression, positioning, and HEP. Baseline:  Goal status: met  2.  Pt will improve bil hamstring length to WNL to reduce back pain Baseline:  Goal status: met  3.  Pt will be able to perform light daily tasks within home for up to 30 min before needing a positional rest break. Baseline:  Goal status: met for some days of the week, ongoing  4.  Pt will achieve improved soft tissue mobility of Lt side of trunk and bil cervical region to reduce pain and decompress abdominal organs Baseline:  Goal status: met  5.  Improve neck rotation to at least 50 deg for improved visibility with driving. Baseline: 40 deg bil Goal status: ongoing, regressed to 35 deg on 11/30  6. Pt will be able to perform NuStep for up to 8 min while holding conversation to demo improved cardiovascular endurance.  Baseline: 4-5'  Goal status: new     PLAN: PT FREQUENCY: taper to 1x/week  PT DURATION: 6 weeks  PLANNED INTERVENTIONS: Therapeutic exercises, Therapeutic activity, Neuromuscular re-education, Balance training, Patient/Family education, Self Care, Joint mobilization, Aquatic Therapy, Dry Needling, Electrical stimulation, Spinal mobilization, Cryotherapy, Moist heat, and Manual therapy.  PLAN FOR NEXT SESSION: ERO with d/c likely  Bernhard Koskinen, PT 11/26/22 1:28 PM

## 2022-12-03 ENCOUNTER — Ambulatory Visit: Payer: Medicare Other | Attending: Family Medicine | Admitting: Physical Therapy

## 2022-12-03 ENCOUNTER — Encounter: Payer: Self-pay | Admitting: Physical Therapy

## 2022-12-03 DIAGNOSIS — M546 Pain in thoracic spine: Secondary | ICD-10-CM | POA: Insufficient documentation

## 2022-12-03 DIAGNOSIS — G8929 Other chronic pain: Secondary | ICD-10-CM | POA: Diagnosis not present

## 2022-12-03 DIAGNOSIS — M6281 Muscle weakness (generalized): Secondary | ICD-10-CM | POA: Insufficient documentation

## 2022-12-03 DIAGNOSIS — M545 Low back pain, unspecified: Secondary | ICD-10-CM | POA: Diagnosis not present

## 2022-12-03 DIAGNOSIS — R293 Abnormal posture: Secondary | ICD-10-CM | POA: Diagnosis not present

## 2022-12-03 DIAGNOSIS — M4125 Other idiopathic scoliosis, thoracolumbar region: Secondary | ICD-10-CM | POA: Diagnosis not present

## 2022-12-03 DIAGNOSIS — M542 Cervicalgia: Secondary | ICD-10-CM | POA: Diagnosis not present

## 2022-12-03 NOTE — Therapy (Signed)
OUTPATIENT PHYSICAL THERAPY TREATMENT       Patient Name: Janet Nguyen MRN: [id number] DOB:[birth year], 89 y.o., female Today's Date: 12/03/2022   PT End of Session - 12/03/22 1101     Visit Number 23    Date for PT Re-Evaluation 12/03/22    Authorization Type Medicare Part A/B    Progress Note Due on Visit 30    PT Start Time 1100    PT Stop Time 1145    PT Time Calculation (min) 45 min    Activity Tolerance Patient tolerated treatment well;Patient limited by pain    Behavior During Therapy [facility] for tasks assessed/performed                          Past Medical History:  Diagnosis Date   Arrhythmia    History of SVT with documented PVC'S and  PAC'S  12/08/12 Nuc stress test normal LV EF 74%  Event Monitor  12/01/12-01/03/13   Atrial flutter (HCC)    Celiac disease    treated by Dr. 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 Marijean Bravo; pt states she was diagnosed 10-15 years ago, not treated at this time or any issues that she is aware of.   Scoliosis    Scoliosis    Sleep apnea 10/02/11 [facility]   Sleep study AHI -total sleep 10.3/hr  64.0/ hr during REM sleep.RDI 22.8/hr during total sleep 64.0/hr during REM sleep The lowest O2 sat during Non-REM and REM sleep was 86% and 88% respectively. 04/08/12 CPAP/BIPAP titration study  [facility]   Past Surgical History:  Procedure Laterality Date   APPENDECTOMY     ruptured at age 32 and had surgery   CARDIAC CATHETERIZATION  01/27/06   CARDIOVERSION N/A 01/08/2022   Procedure: CARDIOVERSION;  Surgeon: Freada Bergeron, MD;  Location: [facility] ENDOSCOPY;  Service: Cardiovascular;  Laterality: N/A;   cataract surgery  2015   Dr. Herbert Deaner; March & April 2015   Patient Active  Problem List   Diagnosis Date Noted   Secondary hypercoagulable state ([place]) 12/12/2021   Hyponatremia 12/04/2021   Acute CHF (congestive heart failure) ([place]) 12/02/2021   Acute on chronic diastolic CHF (congestive heart failure) ([place]) 12/02/2021   Atrial fibrillation with rapid ventricular response ([place]) 12/02/2021   Essential hypertension 12/02/2021   GERD without esophagitis 12/02/2021   Obstructive sleep apnea 12/02/2021   Interstitial lung disease ([place]) 12/02/2021   History of pulmonary embolism 12/02/2021   Pulmonary embolism ([place]) 11/06/2021   Bronchiectasis ([place]) 11/06/2021   Deviated septum 05/08/2020   Mass of subcutaneous tissue 05/02/2020   Vertigo 06/16/2019   Pulmonary hypertension, unspecified ([place]) 04/14/2019   Ischemic colitis ([place]) 04/14/2019   Migraine with aura and without status migrainosus, not intractable 11/08/2018   Degeneration of lumbar intervertebral disc 10/14/2018   Hoarseness of voice 03/04/2018   Abdominal pain 07/01/2017   Diverticulitis, colon    Metabolic acidosis, increased anion gap    Constipation 04/14/2017   Lumbar hernia 04/14/2017   Presbycusis of both ears 01/10/2017   Tinnitus aurium, bilateral 01/10/2017   Gastroesophageal reflux disease 08/20/2016   Hemoptysis 08/20/2016   Obstructive sleep apnea of adult 08/20/2016   Rhinitis, chronic 08/20/2016  Throat pain in adult 08/20/2016   Fatigue 12/23/2015   Sciatica of right side 10/06/2015   History of migraine headaches 10/06/2015   Frequent PVCs 12/28/2013   Premature atrial contractions 12/28/2013   PSVT (paroxysmal supraventricular tachycardia) (HCC) 12/28/2013   Heart palpitations 07/13/2013   Sleep apnea 04/11/2013   Scoliosis 04/11/2013   Atypical atrial flutter ([place]) 11/29/2012   Chest pain, atypical 11/29/2012   Fibromyalgia syndrome 11/29/2012   Chronic steroid use 11/29/2012      REFERRING PROVIDER: Lawerance Cruel, MD   REFERRING DIAG: M54.9 (ICD-10-CM) -  Back pain   Rationale for Evaluation and Treatment Rehabilitation  THERAPY DIAG:  Other idiopathic scoliosis, thoracolumbar region  Muscle weakness (generalized)  Pain in thoracic spine  Cervicalgia  Abnormal posture  Chronic bilateral low back pain without sciatica  ONSET DATE: chronic LBP  SUBJECTIVE:                                                                                                                                       I did 10 min of exercises before I came.  Standing stretches and bands. I am using my bed to stretch in.    SUBJECTIVE STATEMENT:  From evaluation: I have had a bad year.  I had PE, was hospitalized, have a swollen Rt leg, am now on Lasix, I'm in afib (cardioversion didn't stick).  Blood clot in leg was ruled out.  My leg swelling is much better than it was but still very swollen.  I am easily winded even when I'm talking.   I have lost 5 inches with my scoliosis.  I am so compressed and painful on my Lt side I have no room for my stomach so eating is very challenging.  I struggle to move my bowels.   I can only lay on my Rt side or my back.  Laying on my Rt side so much can start hurting my hip.   My neck and shoulders always hurt.   My activity level has diminished signif due to getting so easily winded.  I won't be able to do the NuStep given how winded I get.  I am trying to consistently get back into my stretches from last PT sessions.   PERTINENT HISTORY:  Pt has cardiac doctor, rheumatologist Complex medical history: of note October 25, 2021 was positive for PE, hospitalized on March 6 through December 04, 2021 with increased palpitations and was found to be in atrial fibrillation, cardioversion April 2023 was initially successful but not long term  Long history of pain from signif scoliosis, has been in/out of PT for this History of migraine headaches Sleep apnea without tolerance of CPAP, uses mouthguard  PAIN:  PAIN:  Are you having pain?  Yes NPRS scale: 5/10 Pain location: neck, back, shoulders, Rt hip Pain orientation: Right and Bilateral  PAIN TYPE: aching, throbbing, and tight Pain description: constant  Aggravating factors: activity, laying on Rt side Relieving factors: rest    PRECAUTIONS: None  WEIGHT BEARING RESTRICTIONS No  FALLS:  Has patient fallen in last 6 months? No  LIVING ENVIRONMENT: Lives with: lives alone Lives in: Other single level condo Stairs: No Has following equipment at home: None  OCCUPATION: retired  PLOF: Independent with household mobility without device and Independent with community mobility without device  PATIENT GOALS open up Lt side of trunk, work back into my stretches, less pain   OBJECTIVE:   DIAGNOSTIC FINDINGS:  No new imaging    SCREENING FOR RED FLAGS: Bowel or bladder incontinence: No Spinal tumors: No Cauda equina syndrome: No Compression fracture: No Abdominal aneurysm: No  COGNITION:  Overall cognitive status: Within functional limits for tasks assessed     SENSATION: WFL  MUSCLE LENGTH: 11/13/22: improving hamstring length bil, improved lat length allowing greater Rt UE overhead reaching with less pain  End range bil hamstring limited Lt lat limits full Lt shoulder flexion  POSTURE:  severe scoliosis, concave on Lt with lower ribcage and pelvis approximation  PALPATION: 11/13/22: improved sensitivity and mobility of fascia and soft tissues along Lt trunk, short side of scoliosis, improved tenderness throughout paraspinals neck to pelvis,  improved Rt LE edema control with compression stockings  08/28/22: improved soft tissue and fascial mobility along Lt trunk related to short compressed side of scoliosis, ongoing rigidity of ribcage and spine; improved Rt LE edema control with compression stockings  Evaluation: Diffuse tenderness cervical, thoracic soft tissues, very tight Lt latissimus dorsi, bil pectorals, Lt obliques and QL, Rt  hip Non-pitting edema in Rt leg and foot present - under care of MD and DVT ruled out per chart review, on Lasix  CERVICAL ROM:   Active  A/PROM  eval A/ROM 08/28/22 A/ROM 11/13/22  Flexion 57 65 65  Extension 35 35 35  Right lateral flexion '12 25 25  '$ Left lateral flexion '15 25 25  '$ Right rotation 40 35 40  Left rotation 40 35 50   (Blank rows = not tested)  LUMBAR ROM:   Active  A/PROM  eval A/ROM 11/30 A/ROM 11/13/22  Flexion Fingers to mid shin Fingers to ankles Fingers to ankles no pain  Extension Not tested Not tested NT  Right lateral flexion '5 8 10  '$ Left lateral flexion '3 5 8  '$ Right rotation 20% 50% 50%  Left rotation 20% 35% 45%   (Blank rows = not tested)  LOWER EXTREMITY ROM:     Passive  Right eval Left eval Right 11/30 Left 11/30 Right 2/15 Left 2/15  Hip flexion        Hip extension        Hip abduction        Hip adduction        Hip internal rotation 35 40 35 45 40 45  Hip external rotation 50 60 60 65 65 70  Knee flexion        Knee extension        Ankle dorsiflexion        Ankle plantarflexion        Ankle inversion        Ankle eversion         (Blank rows = not tested)  LOWER EXTREMITY MMT:     11/13/22: 4+/5 bil LE 11/30: 4/5 bil LE Eval: 4/5 bil LE   GAIT: Distance walked: within clinic Assistive device utilized: None Level of assistance: Complete Independence Comments: gets  very winded    TODAY'S TREATMENT  Date: 12/03/22 Seated STM bil upper quadrants and cervical spine Standing review of HEP: QL and pectoral doorway stretch, horiz abd/row/shoulder ext yellow band x10 Sit to stand 2x10 Supine DKTC, fig 4, hamstring stretch, LE circles in the air, scissors in the air review of HEP SL hip abd x10 with knee bent    Rt SL broadening to obliques, QL and paraspinals cervical to pelvis  Date: 11/26/22 Self-care: Pt wanted to discuss recent visit with PCP, acute flare up of Rt medial ankle swelling, and intermittent history of Lt  hand numbness.  Discussed the plan her PCP put into place for ankle swelling origin diagnosis (awaiting blood work and prescribed meds for gout), discussed how pattern of numbness in hand can help understand where origin of numbness is coming from - MD has encouraged her to keep a diary for history of numbness since Pt is unsure of distribution, discussed Pt concerns about global arthritis and joint pains/joint noise and how gentle consistent movement is important, muscle atrophy with aging causing weight loss and weakness discussed to reiterate importance of HEP Rt SL broadening to obliques, QL and paraspinals cervical to pelvis Seated upper quadrant STM bil  Date: 11/19/22 NuStep L1 x 5'  Spinal decompression x2'  Supine hamstring stretch 2x30" with ankle circles and pumps SKTC alt LE 3x10" each DKTC rocking side to side 2x30" Fig 4 stretch 2x30" bil Rt SL broadening to obliques, QL and paraspinals cervical to pelvis Seated upper quadrant STM bil Seated cervical rotation A/ROM 10x5" holds alt Rt/Lt Upper trap stretch bil 3x30"  Date: 11/13/22  NuStep L1 x 5' PT present to discuss status - able to maintain conversation throughout  Spinal decompression x2'  Supine hamstring stretch 2x30" with ankle circles and pumps SKTC alt LE 3x10" each DKTC rocking side to side 2x30" Fig 4 stretch 2x30" bil Rt SL broadening to obliques, QL and paraspinals cervical to pelvis Seated upper quadrant STM bil Seated cervical rotation A/ROM 10x5" holds alt Rt/Lt Upper trap stretch bil 3x30"   PATIENT EDUCATION:  Education details: discussed plan of care and goals Person educated: Patient Education method: Explanation Education comprehension: verbalized understanding   HOME EXERCISE PROGRAM: Access Code: [email or web address] URL: [email or web address] Date: 08/07/2022 Prepared by: Venetia Night Shuronda Santino  Exercises - Standing Row with Anchored Resistance  - 1 x daily - 7 x weekly - 2 sets - 10 reps -  Standing Shoulder Extension with Resistance  - 1 x daily - 7 x weekly - 2 sets - 10 reps - Standing Shoulder External Rotation with Resistance  - 1 x daily - 7 x weekly - 2 sets - 10 reps - Standing Shoulder Horizontal Abduction with Resistance  - 1 x daily - 7 x weekly - 2 sets - 10 reps  ASSESSMENT:  CLINICAL IMPRESSION: Pt    OBJECTIVE IMPAIRMENTS decreased activity tolerance, decreased endurance, decreased mobility, decreased ROM, decreased strength, hypomobility, increased fascial restrictions, increased muscle spasms, impaired flexibility, impaired UE functional use, improper body mechanics, postural dysfunction, and pain.   ACTIVITY LIMITATIONS carrying, lifting, bending, standing, sleeping, transfers, and reach over head  PARTICIPATION LIMITATIONS: cleaning, laundry, driving, shopping, community activity, and yard work  PERSONAL FACTORS Age and Time since onset of injury/illness/exacerbation are also affecting patient's functional outcome.   REHAB POTENTIAL: Good  CLINICAL DECISION MAKING: Stable/uncomplicated  EVALUATION COMPLEXITY: Low   GOALS: Goals reviewed with patient? Yes  SHORT TERM GOALS: Target date: 07/31/22  Pt will be ind  with initial HEP for trunk and LE mobility and stretching Baseline: Goal status: met    LONG TERM GOALS: Target date: 12/03/22  Pt will be ind with strategies for symptom management including pacing, spinal decompression, positioning, and HEP. Baseline:  Goal status: met  2.  Pt will improve bil hamstring length to WNL to reduce back pain Baseline:  Goal status: met  3.  Pt will be able to perform light daily tasks within home for up to 30 min before needing a positional rest break. Baseline:  Goal status: met for some days of the week, ongoing  4.  Pt will achieve improved soft tissue mobility of Lt side of trunk and bil cervical region to reduce pain and decompress abdominal organs Baseline:  Goal status: met  5.  Improve  neck rotation to at least 50 deg for improved visibility with driving. Baseline: 40 deg bil Goal status: ongoing, regressed to 35 deg on 11/30  6. Pt will be able to perform NuStep for up to 8 min while holding conversation to demo improved cardiovascular endurance.  Baseline: 4-5'  Goal status: new     PLAN: PT FREQUENCY: taper to 1x/week  PT DURATION: 6 weeks  PLANNED INTERVENTIONS: Therapeutic exercises, Therapeutic activity, Neuromuscular re-education, Balance training, Patient/Family education, Self Care, Joint mobilization, Aquatic Therapy, Dry Needling, Electrical stimulation, Spinal mobilization, Cryotherapy, Moist heat, and Manual therapy.  PLAN FOR NEXT SESSION: d/c to HEP  PHYSICAL THERAPY DISCHARGE SUMMARY  Visits from Start of Care: 23  Current functional level related to goals / functional outcomes: Pt has reached plateau in progress.  D/C to HEP at this time.   Remaining deficits: See above   Education / Equipment: HEP   Patient agrees to discharge. Patient goals were partially met. Patient is being discharged due to maximized rehab potential.   Baruch Merl, PT 12/03/22 1:24 PM

## 2022-12-10 DIAGNOSIS — Z6820 Body mass index (BMI) 20.0-20.9, adult: Secondary | ICD-10-CM | POA: Diagnosis not present

## 2022-12-10 DIAGNOSIS — M19049 Primary osteoarthritis, unspecified hand: Secondary | ICD-10-CM | POA: Diagnosis not present

## 2022-12-10 DIAGNOSIS — K589 Irritable bowel syndrome without diarrhea: Secondary | ICD-10-CM | POA: Diagnosis not present

## 2022-12-10 DIAGNOSIS — I4891 Unspecified atrial fibrillation: Secondary | ICD-10-CM | POA: Diagnosis not present

## 2022-12-10 DIAGNOSIS — D6869 Other thrombophilia: Secondary | ICD-10-CM | POA: Diagnosis not present

## 2022-12-10 DIAGNOSIS — R0981 Nasal congestion: Secondary | ICD-10-CM | POA: Diagnosis not present

## 2022-12-10 DIAGNOSIS — J479 Bronchiectasis, uncomplicated: Secondary | ICD-10-CM | POA: Diagnosis not present

## 2022-12-10 DIAGNOSIS — G479 Sleep disorder, unspecified: Secondary | ICD-10-CM | POA: Diagnosis not present

## 2022-12-22 ENCOUNTER — Ambulatory Visit: Payer: Medicare Other | Attending: Cardiovascular Disease | Admitting: Cardiovascular Disease

## 2022-12-22 ENCOUNTER — Other Ambulatory Visit: Payer: Self-pay | Admitting: Cardiovascular Disease

## 2022-12-22 ENCOUNTER — Encounter: Payer: Self-pay | Admitting: Cardiovascular Disease

## 2022-12-22 DIAGNOSIS — R600 Localized edema: Secondary | ICD-10-CM | POA: Insufficient documentation

## 2022-12-22 DIAGNOSIS — R918 Other nonspecific abnormal finding of lung field: Secondary | ICD-10-CM | POA: Diagnosis not present

## 2022-12-22 DIAGNOSIS — I4819 Other persistent atrial fibrillation: Secondary | ICD-10-CM | POA: Diagnosis not present

## 2022-12-22 DIAGNOSIS — Z7901 Long term (current) use of anticoagulants: Secondary | ICD-10-CM | POA: Insufficient documentation

## 2022-12-22 DIAGNOSIS — M4125 Other idiopathic scoliosis, thoracolumbar region: Secondary | ICD-10-CM | POA: Diagnosis not present

## 2022-12-22 DIAGNOSIS — J479 Bronchiectasis, uncomplicated: Secondary | ICD-10-CM | POA: Insufficient documentation

## 2022-12-22 DIAGNOSIS — Z86711 Personal history of pulmonary embolism: Secondary | ICD-10-CM | POA: Insufficient documentation

## 2022-12-22 MED ORDER — METOPROLOL SUCCINATE ER 50 MG PO TB24: ORAL_TABLET | ORAL | 3 refills | Status: DC

## 2022-12-22 NOTE — Progress Notes (Signed)
50,000 patient ID: Janet Nguyen, female   DOB: 11/20/1931, 89 y.o.   MRN: [id number]      Primary  M.D.: Dr. Mardene Sayer  HPI: [name] is a 89 y.o. female who presents for a 3 month followup cardiology evaluation.  [name] has a history of documented SVT and also has PACs and PVCs  treated with beta blocker therapy. In April 2014 I further titrated her beta blocker therapy after cardiac event monitor revealed several bursts of recurrent SVT up to 177 beats per minute in March.  In July after she had had 2 episodes of chest fluttering which each lasted over 30 minutes which he did take metoprolol tartrate with relief I recommended further titration of her Toprol to 75 mg in the morning and 50 mg at night and if necessary she could further titrate this to 75 twice a day.  She has a history of obstructive sleep apnea but despite multiple attempts at CPAP utilization she has not been able to tolerate this. She was referred to Dr. [name] and has a customized dental appliance with mandibular advancement with improvement in some of her symptomatology. Due concern that her teeth may be moving in more recently she has has not been using customized appliance daily but has been using her old non-customized mouthguard.  At times shen has awakened abruptly from a dream with her heart pounding and a sensation of hot flashes, gasping for breath.   She also states that her scoliosis is getting worse. She does have left-sided musculoskeletal type chest pain due to her spine angulation.  Beause of her significant scoliosis, she feels she must sleep on her back.   She has a history of GERD for which she has been taking Nexium.  She presents for evaluation.  I had scheduled her for an overnight oximetry to see if she is a candidate for supplemental oxygen at nighttime since she refused to use CPAP and only very rarely uses her customized mouthpiece.  She does wear a mouthguard to reduce  bruxism.  Oximetry study was performed overnight on February 23/24.  Her mean oxygen saturation was 93.12%.  She spent 12 minutes and 16 seconds with O2 sat duration below 88% with the lowest O2 sat duration of 80%. I tried to set her up for supplemental oxygen at bedtime.  However, she has been denied for this on multiple occasions by her insurance/Medicare.  She feels that she is sleeping better.  She can only sleep in her back due to scoliosis.  Her left side is concave; her right side is convex.  She has had issues with labile blood pressure. She has had issues with lower back discomfort and sciatica leading to emergency room evaluation in November 2016.  She saw Dr. [name] for neurosurgical evaluation. She has migraine headaches. She  Recently saw Dr. [name] for these migraine headaches.  She has been on Toprol-XL 75 mg twice a day for her palpitations and presently denies any awareness an extra heartbeats. She takes Xanax on an as-needed basis for anxiety.  She has been taking Nexium 20, no grams every other day for GERD. She states her scoliosis is getting worse.  Has been experiencing more fatigue.  She also notes some occasional left hand numbness.  In a mouth guard but no ureteral longer uses her mandibular advancement device and not tolerate CPAP therapy for her obstructive sleep apnea.    She had experienced left-sided chest discomfort which most likely  was related to her significant scoliosis the potential neuropathy causing intermittent left arm and hand numbness.  I slightly reduced her Toprol-XL which she had been taking 150 mg daily and a 75 twice a day regimen to 75 and milligrams in the morning and 50 mg with ultimate plan to decrease this to 50 twice a day.  She states when she reduce this, she began to notice more optical migraines and resume taking the higher dose Toprol at 75 twice a day with improvement.  She has seen Dr. [name] for her paresthesias.  She admits to significant anxiety.   She had requested zolpidem for sleep initiation and maintenance.  In the past, we had tried 6.25 slow-release version to help with sleep maintenance, but due to cost issues preferred the 5 mg sleep initiation dose.  She experiences optical migraine headaches.  She was being seen by neurologist, Dr. Melton Alar  She continues to have issues with her scoliosis causing discomfort in her neck and arm due to her distortion.  He tells me she underwent endoscopy and colonoscopy as well as banding of hemorrhoids.  She had a benign polyp removed.  She has issues with spastic colon.  Her sleep is better with zolpiden 5 mg.  She saw Kerin Ransom on 11/13/2016.  She had noticed that her hair was "thinning" and was concerned about this being the result of Toprol.  He suggested possibly decreasing Toprol but she preferred not until she had seen me.  Since that time, she continues to experience some optical migraines.  She has noticed some leg left neck discomfort.  She denies any patches of hair loss.  She denies differential arm weakness.  She continues to have difficulty with her scoliosis with hip pain and rib cage discomfort.   When I saw her in March 2018, there was a blood pressure differential of 118/78 in the left arm and 140/80 in the right arm.  She subsequently underwent upper extremity Doppler evaluation on 12/25/2016.  She was noted have mild heterogeneous plaque with stable 1-39% bilateral internal carotid stenoses.  She had normal subclavian arteries bilaterally.  There are patent vertebral arteries with antegrade flow.  She did not have any significant blood pressure differential and had triphasic waveforms in both her right and biphasic in her left brachial artery.  Left blood pressure was 8 mm higher than the right brachial pressure.  At times, she continues to experience some vague chest wall symptoms, which most likely related to her posture.  She denies significant shortness of breath.  She has to sleep on  her back because of her scoliosis.  Uses a chin strap to prevent oral breathing.  She is not on CPAP.   In June 2019 she had concerns about some of her medications possibly causing hair loss or memory loss.  She admits to having dry eyes.  She continues to have difficulty from her scolWsaw her in November 2019 at which time she,  felt well from a cardiac standphk and apparently had a new oral appliance made.  She used this initially but then started to notice some teeth discomfort.  Apparently this again was treated by Dr.e still admits to being tired.  She has issues with her abdomen being tight which she believes is related to her scoliosis.  Her GERD is controlled with pantoprazole.    She was seen by Dr. Mardene Sayer for her primary care.  She had developed a fungal infection of her toenail.  He had  given her to Adelino fine 20 mg.  She has not started this and was hesitant to do this due to potential interaction with metoprolol.  She also was evaluated by Dr. Brett Fairy.  According to Ms. Durfee she did not evaluate her ophthalmic migraines.  However she was planning to do a possible home study to reassess her sleep apnea.  In the past she did not tolerate any CPAP therapy and although initially tolerated customized oral appliance this seemed to cause difficulty with movement of her teeth.  She has been using the CALM app on her smart phone to help with relaxation and improve sleep and is chronically dependent on low-dose zolpidem prescribed by Dr. Harrington Challenger.  She denies chest pain.  Her palpitations are well controlled with her current dose of metoprolol 75 mils twice a day and she can to be on low-dose amlodipine 2.5 mg.    Since I saw her in June 2020, she underwent the home study by Dr. Theodoro Clock was done after several studies with no data.  According to Dr. Edwena Felty note, AHI was 26.5 which was moderate and during REM sleep AHI rose to 34.9.  Snoring was not recorded.  Apparently there was discussion of  possible Financial planner.  Ms. Pinder is not interested.  I saw her in October 2020 at which time she had continued issues with migraines and some vertigo for which he takes the Epley maneuver.  She continued to have discomfort related to her scoliosis with muscle discomfort on her chest.  She believes she is waking up during dreaming and not sleeping well.  Her blood pressure has increased.  At her prior evaluation, I had instituted low-dose amlodipine at 2.5 mg.  With her blood pressure elevated during that evaluation I further titrated this to 5 mg I recommended she continue taking Toprol XL 75 mg twice a day.  She did not have any episodes of palpitations or recurrent SVT.  I saw her in January 2021. She had seen  Dr. Jaynee Eagles of neurology.  She continued to experience some muscular neck aches which may be related to her scoliosis but do not sound ischemic in etiology.  She states she has some varicose veins in the right lower extremity and complains of trivial ankle swelling.  She denies palpitations.    I saw her in April 2021.  Over the previous several months she had felt well from a cardiovascular standpoint.  She continues to have issues with her scoliosis causing back issues as well as some left-sided discomfort along her rib cage.   She has sleep apnea and remotely did not tolerate CPAP or an oral appliance  She continues to be on amlodipine 5 mg daily, metoprolol succinate 75 mg twice a day both for blood pressure and her palpitations. GERD is fairly well controlled on pantoprazole. She continues to be on Librax which she states does offer improvement and she takes alprazolam on a as needed basis.  I saw her in October 2021 and since her prior evaluation she had undergone neurology evaluation by Dr. Tomi Likens and has had migraine issues with aura since young adulthood.  An MRI of the brain in 2017 showed mild chronic small vessel ischemic changes otherwise was unremarkable.  She had developed  swelling in her leg right greater than left and has varicose veins.  She underwent lower extremity venous study which revealed no DVT but there was evidence for venous reflux in the right saphenofemoral junction, right greater saphenous vein in the  thigh and right greater saphenous vein in the calf.  She has used compression stockings in the past.  There was no plan for intervention.  She has been undergoing physical therapy.  There is no change in her intermittent rib discomfort from her scoliosis.  She denies palpitations.    I saw her in May 2022.  Over the prior 6 months she continued to have issues with her rib cage and hip bone discomfort contributed by her scoliosis.  She has had issues with varicose veins.  She has to sleep on her side.  She was recently evaluated by Dr. Earlie Server for elevated monocytes who felt she was stable hematologically.  She denies any angina.  She is unaware of palpitations and continues to be on Toprol all succinate 75 mg twice a day.  She is also on amlodipine 5 mg for hypertension.  She has a prescription for alprazolam and rarely if ever takes this for anxiety.    When I saw her on October 10, 2021 she was continuing to have issues resulting from her scoliosis.  She was recently evaluated at [facility] and a chest CT revealed scattered areas of focal bronchiectasis and clustered nodules in the lower lungs, most pronounced in the right middle lobe and lingula.  There also was a part solid nodule of the right lower lobe measuring 7.5 mm in mean diameter with 4 mm solid component.  A follow-up noncontrast CT was recommended in 3 to 6 months.  She was experiencing increasing shortness of breath with activity and denied any anginal symptoms.  Since I saw her, she was evaluated by Dr. Chase Caller in follow-up of her chest CT for pulmonary evaluation of her bronchiectasis and shortness of breath.  Laboratory revealed an elevated D-dimer.  Subsequent VQ scan on October 25, 2021 was positive for PE.  A 2D echo Doppler study showed mildly elevated pulmonary artery pressure 38 mm with grade 2 diastolic dysfunction, severe left atrial enlargement with moderate right atrial enlargement, and moderately severe MR.  Venous Doppler was negative for DVT.  She was started on Eliquis and Spiriva.  She had pulmonary follow-up on February 3 and was last seen by Dr. Chase Caller on November 28, 2021.  She was hospitalized on March 6 through December 04, 2021 with increased palpitations and was found to be in atrial fibrillation.  On presentation sodium was 126 potassium 5.2.  Troponins were negative.  Chest x-ray showed cardiomegaly with bilateral atelectasis and small bilateral pleural effusions.  She was started on IV diuresis.  She was on beta-blocker with metoprolol succinate 75 mg and diltiazem was added for rate control.  Follow-up echo showed EF 60 to 65% with moderate dilation of bilateral atriums.  There is a small circumferential pericardial effusion.  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  During hospitalization she was seen by Dr. Johney Frame of cardiology.  She was discharged on December 04, 2021.  I saw her in follow-up of her hospitalization on December 18, 2021.  At that time, she felt somewhat improved.  However, her ECG revealed atrial fibrillation at 94 bpm.  She was less tachycardic.  She continues to have issues with her scoliosis.  She is on medical regimen of Eliquis 5 mg twice a day, metoprolol succinate 75 mg daily, diltiazem CD1 120 mg daily, furosemide 20 mg, as well as Spiriva.  During that evaluation, I  recommended further titration of her metoprolol succinate to 100 mg daily and that she take this in a 50 twice daily regimen for more stable blood pressure.  She continues to be on Eliquis for anticoagulation.  I schedule her for outpatient cardioversion.  Ms. Pelphrey  underwent DC cardioversion by Dr. Johney Frame on January 08, 2022.  She was successfully cardioverted.  Subsequently, she saw Dr. Chase Caller on January 24, 2022.  She apparently was to be on Spiriva and the flutter valve but apparently was not doing this.  I saw her for follow-up on Jan 29, 2022.  At that time she stated her blood pressure had been typically in the 664-403 systolic range.  She had noticed heart rate irregularity for the last 1 to 2 weeks.  Her ECG revealed that she is back in atrial fibrillation with coarse fibrillatory waves with ventricular rate at 97.  At that time I discussed options with the patient.  With her age of 89 years old I recommended further titration of diltiazem to 180 mg daily from her present dose of 120 mg.  I did not feel she was a candidate for amiodarone in light of her recent pulmonary history and recommended she be evaluated in the atrial fibrillation clinic for consideration of possible initiation of other antiarrhythmic treatment.  She was scheduled to have a follow-up chest CT in the future with Dr. Chase Caller.  She was evaluated by Malka So on Feb 04, 2022.  At that time there was significant discussion concerning possible admission for dofetilide and reportedly she was agreeable.  He recommended that she continue diltiazem 180 mg daily, Toprol 50 mg twice a day, and Eliquis 5 mg twice a day.  Apparently, subsequently Ms. Gaynor had real concerns about the hospitalization and canceled the tentatively scheduled admission.    I saw her on March 03, 2022 for follow-up cardiology evaluation.  She had reduced her diltiazem back to the 120 mg dose since she felt the 180 mg dose was causing stomach upset.  She admitted to be under significant stress trying to figure out all her health issues.  She had significant reservations about Tikosyn loading.  During that evaluation we discussed options and felt with her age rate control strategy may be most appropriate.  However in order to  improve heart rate response I added digoxin at 0.0625 mg.  I last saw her on April 30, 2022.  Since her prior evaluation since she underwent pulmonary evaluation with Dr. Chase Caller.  She was not satisfied with that appointment and remains frustrated.  Her CT scan had been done which showed improvement in prior lung nodule.  She was using flutter valve for her bronchiectasis.  Presently, she denies any chest pain.  She has had issues with leg swelling right greater than left and has issues with varicose veins.  With her progressive scoliosis she notes some discomfort from compression of her stomach.  She admits to some itching.  She still experiences some shortness of breath.  Her CT scan did not show any recurrent PE a pulmonary trunk was enlarged indicative of pulmonary artery hypertension.  Laboratory from July 20 showed BNP elevated at 870.  D-dimer was mildly increased at 0.73, improved from January 2023.  During that evaluation she had progressive lower extremity edema and was taking furosemide 20 mg daily.  I suggested she increase furosemide to 40 mg in the morning until the edema improved then try alternating 40 with 20 every other day.  Since I saw  her, she has been evaluated by Fabian Sharp on September 7 and most recently on July 24, 2022.  Her swelling had improved.  She was concerned about Eliquis bleed risk.  She was not having chest pain.  I last saw her on September 15, 2022.  At that time she was having progressive lower extremity edema bilaterally particularly most prominent in the right lower extremity up to the knees.  She has been on Eliquis now at the reduced dose of 2.5 mg twice a day, diltiazem 120 daily.  She has been taking furosemide 40 mg instead of alternating with 20 mg.  She is on metoprolol succinate 75 mg twice a day, long-acting diltiazem 120 mg and digoxin 0.0625 mg daily for additional rate control.  She takes Spiriva inhalation.  During that evaluation, 33+ edema on the  right and 2+ on the left I recommended she discontinue furosemide and substituted this with torsemide.  Initially she was to take 40 mg in the morning and 20 mg in the afternoon and depending upon results can change to 20 twice a day.  She was now on a reduced dose of Eliquis.  Since I last saw her, Ms. Urbanovsky has felt well.  Swelling significantly resolved with change to torsemide.  Presently, she is taking metoprolol succinate 75 mg twice a day, diltiazem CD120 daily, digoxin 0.125 mg daily in addition to reduced dose Eliquis 2.5 twice daily.  He continues to take torsemide now 20 mg in the morning and 10 mg in the evening.  At time she admits that she gets anxious and her heart rate speeds up.  Denies any chest pressure.  She saw Dr. Chase Caller for follow-up on October 30, 2022.  She has continued using Spiriva for bronchiectasis and exertional dyspnea.  In 2023, PFTs were  felt to be restrictive expected with her degree of scoliosis.  The DLCO had mild/moderate reduction in lung volumes were slightly reduced on prior evaluation in 2023.  She presents for reevaluation.  Past Medical History:  Diagnosis Date   Arrhythmia    History of SVT with documented PVC'S and  PAC'S  12/08/12 Nuc stress test normal LV EF 74%  Event Monitor  12/01/12-01/03/13   Atrial flutter ([place])    Celiac disease    treated by Dr. 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 Marijean Bravo; pt states she was diagnosed 10-15 years ago, not treated at this time or any issues that she is aware of.   Scoliosis    Scoliosis    Sleep apnea 10/02/11 [facility]   Sleep study AHI -total sleep 10.3/hr  64.0/ hr during REM sleep.RDI 22.8/hr during total sleep 64.0/hr during REM sleep The lowest O2 sat during Non-REM and REM sleep  was 86% and 88% respectively. 04/08/12 CPAP/BIPAP titration study [facility]    Past Surgical History:  Procedure Laterality Date   APPENDECTOMY     ruptured at age 59 and had surgery   CARDIAC CATHETERIZATION  01/27/06   CARDIOVERSION N/A 01/08/2022   Procedure: CARDIOVERSION;  Surgeon: Freada Bergeron, MD;  Location: [facility];  Service: Cardiovascular;  Laterality: N/A;   cataract surgery  2015   Dr. Herbert Deaner; March & April 2015    Allergies  Allergen Reactions   Gluten Meal Other (See Comments)    Unknown   Naproxen Other (See Comments)    Stomach upset    Current Outpatient Medications  Medication Sig Dispense Refill   acetaminophen (TYLENOL) 500 MG tablet Take 500 mg by mouth every 8 (eight) hours as needed for moderate pain.     Aloe-Sodium Chloride (AYR SALINE NASAL GEL NA) Place 1 application  into the nose daily as needed (rhinitis).     ALPRAZolam (XANAX) 0.5 MG tablet Take 0.25-0.5 mg by mouth 2 (two) times daily as needed for anxiety.     apixaban (ELIQUIS) 2.5 MG TABS tablet Take 1 tablet (2.5 mg total) by mouth 2 (two) times daily. 42 tablet 3   Biotin w/ Vitamins C & E (HAIR SKIN & NAILS GUMMIES PO) Take 1 tablet by mouth daily.     Calcium Carb-Ergocalciferol (CHEWABLE CALCIUM/D PO) Take 1 each by mouth daily.     clidinium-chlordiazePOXIDE (LIBRAX) 5-2.5 MG capsule Take 1 capsule by mouth daily as needed. 60 capsule 3   digoxin (LANOXIN) 0.125 MG tablet TAKE 1/2 TABLET BY MOUTH DAILY 15 tablet 3   diltiazem (CARDIZEM CD) 120 MG 24 hr capsule TAKE ONE CAPSULE BY MOUTH DAILY 90 capsule 1   fluticasone (FLONASE) 50 MCG/ACT nasal spray Place 1 spray into both nostrils daily as needed for allergies or rhinitis.     mirtazapine (REMERON) 15 MG tablet Take 15 mg by mouth at bedtime.     Phenylephrine-APAP-guaiFENesin (EQ SINUS CONGESTION & PAIN PO) Take 1 tablet by mouth daily as needed (for sinus pain). Contains acetaminophen 325 mg and  Phenylephrine 5 mg     polyethylene glycol powder (GLYCOLAX/MIRALAX) 17 GM/SCOOP powder Take 17 g by mouth daily as needed for moderate constipation.     potassium chloride (KLOR-CON M) 10 MEQ tablet TAKE ONE TABLET BY MOUTH DAILY 30 tablet 2   Tiotropium Bromide Monohydrate (SPIRIVA RESPIMAT) 1.25 MCG/ACT AERS Inhale 2 puffs into the lungs daily. 4 g 5   torsemide (DEMADEX) 20 MG tablet Take 1.5 tablets (30 mg total) by mouth 2 (two) times daily. Take 1 Tablet in the Morning and 0.5 Tablets in the evening 90 tablet 3   zolpidem (AMBIEN) 5 MG tablet Take 2.5 mg by mouth at bedtime.     metoprolol succinate (TOPROL-XL) 50 MG 24 hr tablet Take 100 mg ( 2 tablets of 50 mg ) in the morning  and  75 mg ( 1 and 1/2 tablet  of 50 mg) in the evening 270 tablet 3   No current facility-administered medications for this visit.    Socially she is divorced has 4 children 9 grandchildren. She does exercise. No tobacco use. She does occasional wine.  ROS General: Negative; No fevers, chills, or night sweats;  HEENT: Negative; No changes in vision or hearing, sinus congestion, difficulty swallowing Pulmonary:  unprovoked PE January 2023, bronchiectasis Cardiovascular: See HPI GI: Negative; No nausea, vomiting, diarrhea, or abdominal pain GU: Negative; No dysuria, hematuria, or difficulty voiding Musculoskeletal: Positive for significant scoliosis; fibromyalgia;  joint pain, or weakness Hematologic/Oncology: Negative; no easy bruising, bleeding Endocrine: Negative; no heat/cold intolerance; no diabetes Neuro: History of migraine headaches Skin: Positive for fungal infection of her toenail Psychiatric: Negative; No behavioral problems, depression Sleep: Positive for sleep apnea ; No snoring, daytime sleepiness, hypersomnolence, bruxism, restless legs, hypnogognic hallucinations, no cataplexy Other comprehensive 14 point system review is negative.  PE BP (!) 126/96 (BP Location: Left Arm, Patient  Position:  Sitting, Cuff Size: Normal)   Pulse (!) 101   Ht 5' (1.524 m)   Wt 111 lb (50.3 kg)   BMI 21.68 kg/m    Repeat blood pressure by me was 120/84.  Wt Readings from Last 3 Encounters:  12/22/22 111 lb (50.3 kg)  11/06/22 112 lb 3.2 oz (50.9 kg)  10/30/22 111 lb 9.6 oz (50.6 kg)   General: Alert, oriented, no distress.  Skin: normal turgor, no rashes, warm and dry HEENT: Normocephalic, atraumatic. Pupils equal round and reactive to light; sclera anicteric; extraocular muscles intact;  Nose without nasal septal hypertrophy Mouth/Parynx benign; Mallinpatti scale 2 Neck: No JVD, no carotid bruits; normal carotid upstroke Lungs: clear to ausculatation and percussion; no wheezing or rales Chest wall: without tenderness to palpitation Heart: PMI not displaced, irregular with ventricular rate around 100., s1 s2 normal, 1/6 systolic murmur, no diastolic murmur, no rubs, gallops, thrills, or heaves Abdomen: soft, nontender; no hepatosplenomehaly, BS+; abdominal aorta nontender and not dilated by palpation. Back: no CVA tenderness Pulses 2+ Musculoskeletal: Osteoarthritis with Heberden's nodes of her hands; kyphoscoliosis Extremities: Resolution of prior 2-3+ lower extremity edema no clubbing cyanosis, Homan's sign negative  Neurologic: grossly nonfocal; Cranial nerves grossly wnl Psychologic: Normal mood and affect    December 22, 2022  ECG (independently read by me):  Atrial fibrillation  at 101, LAHB, QS V1-3  September 15, 2022 ECG (independently read by me): Atrial fibrillation at 76, right axis; PRWP  April 30, 2022 ECG (independently read by me): Atrial fibrillation at 86, LAHB, PRWP   March 03, 2022 ECG (independently read by me): Atrial fibrillation at 97, QS V1-3  Jan 29, 2022 ECG (independently read by me): Probable A fib with coarse fibrilatory waves at 97 bpm vs A flutter with variable block; QTc 441  December 19, 2021 ECG (independently read by me):  Atrial fibrillation at  94, LAHB    October 10, 2021 ECG (independently read by me): Sinus bradycardia at 59,   Feb 13, 2021 ECG (independently read by me): Sinus bradycardia at 59 with mild arhythmia  October 2021 ECG (independently read by me): Sinus rhythm at 67 with mild arrythmia; norma intervals  April 2021 ECG (independently read by me): Normal sinus rhythm at 69 bpm. No ectopy. Normal intervals.  October 18, 2019 ECG (independently read by me): Sinus bradycardia 59 bpm.  QS complex V1 V2.  Normal intervals.  No ectopy  October 2020 ECG (independently read by me): Normal sinus rhythm at 63 bpm.  QS complex V1 V2.  No ectopy.  Normal intervals.  June 2020 ECG (independently read by me): NSR at 62; no ectopy; normal intervals   November 2019 ECG (independently read by me): Normal sinus rhythm with PAC.  Ventricular rate 66.  QS V1 V2.  Mild T wave abnormality  June 2019 ECG (independently read by me): Normal sinus rhythm at 64 bpm.  Normal intervals.  No ectopy.  September 2018 ECG (independently read by me): Normal sinus rhythm at 64 bpm.  Isolated PAC.  QTc interval 414 ms.  QS V1 V2, unchanged.  May 2018 ECG (independently read by me): Normal sinus rhythm at 65 bpm.  QS V1, V2.  Normal intervals.  No ST segment changes.  March 2018 ECG (independently read by me): Normal sinus rhythm at 65 bpm.  No ectopy.  Normal intervals.  September 2017 ECG (independently read by me): Normal sinus rhythm at 62 bpm.  QS V1 and V2.  Normal intervals.  April  2017 ECG (independently read by me): Normal sinus rhythm at 70 bpm.  No ectopy.  PR interval 158 ms and QTc interval 414 ms.  March 2017 ECG (independently read by me): normal sinus rhythm at 61 bpm..  No ST segment changes.  Normal intervals.  QTc interval 396 ms.  August 2014 ECG (independently read by me): Normal sinus rhythm at 63 bpm.  QRS couplets V1 V2.  No cigarette ST-T changes.  May 2016 ECG (independently read by me): Sinus bradycardia 59 bpm.  No  ectopy.  February 2016 ECG (independently read by me): Sinus bradycardia 56 bpm.  Normal intervals.  Prior ECG (independently read by me): Normal sinus rhythm at 62 beats per minute.  QTc interval 411 milliseconds.  QRS complex V1, V2.  Normal intervals.  ECG (independently read by me): Normal sinus rhythm at 62 beats per minute. Normal intervals. QTc interval 399 ms.  Prior ECG of 07/13/2013: Sinus rhythm at 61 beats per minute. QTc interval 406 ms. PR interval normal at 168 ms.  LABS:    Latest Ref Rng & Units 11/06/2022   11:41 AM 09/15/2022    4:04 PM 06/06/2022    9:29 AM  BMP  Glucose 70 - 99 mg/dL 96  105  102   BUN 8 - 27 mg/dL 17  20  20    Creatinine 0.57 - 1.00 mg/dL 1.08  1.07  1.07   BUN/Creat Ratio 12 - 28 16  19  19    Sodium 134 - 144 mmol/L 135  136  137   Potassium 3.5 - 5.2 mmol/L 4.6  4.3  4.7   Chloride 96 - 106 mmol/L 95  95  96   CO2 20 - 29 mmol/L 28  24  22    Calcium 8.7 - 10.3 mg/dL 9.4  9.7  9.7       Latest Ref Rng & Units 09/15/2022    4:04 PM 05/14/2022   10:35 AM 01/29/2022    4:06 PM  Hepatic Function  Total Protein 6.0 - 8.5 g/dL 7.3  7.2  7.6   Albumin 3.7 - 4.7 g/dL 4.2  4.5  4.6   AST 0 - 40 IU/L 25  30  28    ALT 0 - 32 IU/L 15  21  24    Alk Phosphatase 44 - 121 IU/L 101  82  69   Total Bilirubin 0.0 - 1.2 mg/dL 1.1  0.9  0.5       Latest Ref Rng & Units 09/15/2022    4:04 PM 04/17/2022    2:07 PM 01/29/2022    4:06 PM  CBC  WBC 3.4 - 10.8 x10E3/uL 7.7  5.8  7.3   Hemoglobin 11.1 - 15.9 g/dL 14.4  12.6  13.7   Hematocrit 34.0 - 46.6 % 43.3  38.7  41.4   Platelets 150 - 450 x10E3/uL 269  205.0  296    Lab Results  Component Value Date   TSH 1.220 09/15/2022  Lipid Panel     Component Value Date/Time   CHOL 154 12/03/2016 0845   TRIG 81 12/03/2016 0845   HDL 67 12/03/2016 0845   CHOLHDL 2.3 12/03/2016 0845   VLDL 16 12/03/2016 0845   LDLCALC 71 12/03/2016 0845     RADIOLOGY:  CT CHEST 10/09/2021 IMPRESSION: 1. 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 Chronic aspiration could have a similar appearance. 2. Part solid nodule of the right lower lobe measuring 7.5 mm in mean diameter with 4 mm solid component. Follow-up non-contrast CT recommended at 3-6 months to confirm persistence. If unchanged, and solid component remains <6 mm, annual CT is recommended until 5 years of stability has been established. If persistent these nodules should be considered highly suspicious if the solid component of the nodule is 6 mm or greater in size and enlarging. This recommendation follows the consensus statement: Guidelines for Management of Incidental Pulmonary Nodules Detected on CT Images: From the Fleischner Society 2017; Radiology 2017; 284:228-243. 3.  Aortic Atherosclerosis (ICD10-I70.0).     ECHO: 12/03/2021  1. Left ventricular ejection fraction, by estimation, is 60 to 65%. Left  ventricular ejection fraction by 2D MOD biplane is 60.8 %. The left  ventricle has normal function. The left ventricle has no regional wall  motion abnormalities. There is mild left  ventricular hypertrophy. Left ventricular diastolic function could not be  evaluated.   2. Left atrial size was moderately dilated.   3. Right atrial size was moderately dilated.   4. The pericardial effusion is circumferential. There is no evidence of  cardiac tamponade.   5. The mitral valve is myxomatous. Moderate mitral valve regurgitation.  There is moderate late systolic prolapse of the middle scallop of the  posterior leaflet of the mitral valve.   6. Tricuspid valve regurgitation is moderate.   7. The aortic valve is tricuspid. Aortic valve sclerosis is present, with  no evidence of aortic valve stenosis.   8. There is normal pulmonary artery  systolic pressure. The estimated  right ventricular systolic pressure is 99991111 mmHg.   9. The inferior vena cava is dilated in size with >50% respiratory  variability, suggesting right atrial pressure of 8 mmHg.   Comparison(s): Changes from prior study are noted. 10/15/2021: LVEF 63%,  moderate to severe MR with posterior leaflet prolapse, grade 2 DD.   IMPRESSION:  1. Persistent atrial fibrillation ([place])   2. History of pulmonary embolism   3. Chronic anticoagulation   4. Bronchiectasis without complication ([place])   5. Bilateral lower extremity edema: resolved   6. Pulmonary nodules   7. Other idiopathic scoliosis, thoracolumbar region      ASSESSMENT AND PLAN: Ms. Perlstein is an 89 year old female who has a remote history of SVT and PACs/PVCs which were controlled with beta blocker therapy. An echo in March [id number]  normal systolic function with grade 1 diastolic dysfunction and had significant left atrial dilatation and mild dilatation of the right ventricle with moderate dilatation of the right atrium. She had mild/moderate pulmonary hypertension with PA pressures of 42 mm. An echo in October 2018 showed an EF of 55 to 60% with grade 1 diastolic dysfunction, mitral annular calcification with mild MR, and mild pulmonary hypertension with PA pressure 34 mm. Her left atrium was mild to moderately dilated.  She had venous reflux on lower extremity reflux evaluation which did not show any evidence of DVT in October 2021.  A chest CT in January 2023 was done for increasing shortness of breath and revealed probable reactive mediastinal lymph nodes, scattered focal areas of bronchiectasis and clustered nodules in the lower lobes as well as a solitary nodule in the right lower lobe at 7.5 mm with a 4 mm solid component.  She subsequently was found to have pulmonary embolism on January 23 and was started on Eliquis  anticoagulation in addition to Spiriva for her shortness of breath by Dr. Chase Caller.  She was  hospitalized with atrial fibrillation with RVR on December 02, 2021.  Her echo Doppler study at that time showed an EF of 60 to 65% with mild LVH there was moderate biatrial enlargement, small circumferential pericardial effusion and she had moderate late systolic prolapse of the middle scallop of the posterior leaflet of the mitral valve resulting in moderate mitral regurgitation.  There is aortic sclerosis.  When I saw her in March 2023 she continued to be in atrial fibrillation at that time metoprolol succinate was further titrated to 100 mg daily but I recommended she take 50 mg twice a day for more blood pressure stability.  She underwent successful cardioversion on January 08, 2022 performed by Dr. Johney Frame.  Subsequently, she has been in  persistent atrial fibrillation on subsequent evaluation at which time her diltiazem dose was increased to 180 mg and she was referred to atrial fibrillation clinic.  She is not a candidate for amiodarone due to her lung disease.  It was advised that she consider admission for Tikosyn initiation at low-dose but she canceled the admission.  When I saw her when I saw her in June 2023 we had a lengthy discussion and she had reduced her dose of diltiazem down to 120 mg.  She did not feel confident about pursuing Tikosyn and her decision was to attempt rate control.  I added digoxin 0.0625 mg for additional rate control.  A subsequent CT scan was done and she has seen Dr. Chase Caller.  She has bronchiectasis.  Her previous lung nodule had improved.  Develop progressive lower extremity edema.  When last seen, I recommended discontinuance of furosemide and switched her to torsemide.  With this change, her peripheral edema has resolved.  Her blood pressure today is stable although diastolic was increased on presentation question if accurate.  I had a long discussion with her today regarding her medications.  Her ECG shows she is in atrial fibrillation with RVR with a heart rate of 101 bpm  and her previously noted left anterior fascicular block.  I am recommending further slight titration of metoprolol succinate to 100 mg in the morning and she will continue with the 75 mg at night.  Presently will will continue with the diltiazem 120 CD dosing.  She was wondering about whether or not she can get off Eliquis.  I recommended she continue long-term Eliquis particularly with her ongoing atrial fibrillation and remote PE.  She is now on reduced dose 2.5 mg twice a day.  She does have some arthritic issues with Heberden's nodes of her hands.  She continues to have significant kyphoscoliosis and has some chest wall discomfort secondary to this.  On exam today her lungs were relatively clear and she continues to be on Spiriva Respimat with her bronchiectasis.  See her in 6 months for reevaluation or sooner as needed.   Troy Sine, MD, [facility]  12/22/2022 5:26 PM

## 2022-12-22 NOTE — Patient Instructions (Signed)
Medication Instructions:    Increase Metoprolol Succinate 100 mg  ( 2 tablets of 50 mg  in the morning  and 75 mg ( 1 and /2 tablet of 50 mg)     Continue taking Eliquis -- do not stop    Continue taking Torsemide-- do not stop     *If you need a refill on your cardiac medications before your next appointment, please call your pharmacy*   Lab Work:  If you have labs (blood work) drawn today and your tests are completely normal, you will receive your results only by: [place] (if you have MyChart) OR A paper copy in the mail If you have any lab test that is abnormal or we need to change your treatment, we will call you to review the results.   Testing/Procedures:  Not needed  Follow-Up: At [facility], you and your health needs are our priority.  As part of our continuing mission to provide you with exceptional heart care, we have created designated Provider Care Teams.  These Care Teams include your primary Cardiologist (physician) and Advanced Practice Providers (APPs -  Physician Assistants and Nurse Practitioners) who all work together to provide you with the care you need, when you need it.     Your next appointment:   6 month(s)  The format for your next appointment:   In Person  Provider:   Shelva Majestic, MD

## 2022-12-23 ENCOUNTER — Telehealth: Payer: Self-pay | Admitting: Cardiovascular Disease

## 2022-12-23 MED ORDER — APIXABAN 2.5 MG PO TABS: 2.5000 mg | ORAL_TABLET | Freq: Two times a day (BID) | ORAL | 0 refills | Status: DC

## 2022-12-23 NOTE — Telephone Encounter (Signed)
Cannot rely on samples indefinitely, if cost is an ongoing issue then would need to apply for pt assistance or likely change to warfarin. Pt assistance application is found here: [email or web address]  Not sure if [facility] office has samples in the interim, will route message.

## 2022-12-23 NOTE — Telephone Encounter (Signed)
Pt c/o medication issue:  1. Name of Medication: apixaban (ELIQUIS) 2.5 MG TABS tablet   2. How are you currently taking this medication (dosage and times per day)?    Take 1 tablet (2.5 mg total) by mouth 2 (two) times daily.    3. Are you having a reaction (difficulty breathing--STAT)? no  4. What is your medication issue? Calling to see if our office have any samples. States the medication is getting too expensive

## 2022-12-23 NOTE — Telephone Encounter (Signed)
Left message to call back  

## 2022-12-23 NOTE — Telephone Encounter (Signed)
She has put in an application a few times, she has not put in an application this year.   Told her that I would give her 2 weeks worth of samples and print off a pt assistance application for her, she can send in the completed application to [place] and we can fax it in for her.   She verbalized understanding.

## 2022-12-23 NOTE — Telephone Encounter (Signed)
Patient was returning call. Please advise ?

## 2022-12-24 ENCOUNTER — Other Ambulatory Visit (HOSPITAL_COMMUNITY): Payer: Self-pay

## 2022-12-24 NOTE — Telephone Encounter (Signed)
Hi Katherine, unfortunately [facility] doesn't have contracts with this patient's insurance plan so if I try and bill anything it can't transmit to the plan.

## 2022-12-25 DIAGNOSIS — H04123 Dry eye syndrome of bilateral lacrimal glands: Secondary | ICD-10-CM | POA: Diagnosis not present

## 2023-01-06 DIAGNOSIS — J019 Acute sinusitis, unspecified: Secondary | ICD-10-CM | POA: Diagnosis not present

## 2023-01-06 DIAGNOSIS — Z6821 Body mass index (BMI) 21.0-21.9, adult: Secondary | ICD-10-CM | POA: Diagnosis not present

## 2023-01-20 ENCOUNTER — Ambulatory Visit (HOSPITAL_BASED_OUTPATIENT_CLINIC_OR_DEPARTMENT_OTHER)
Admission: RE | Admit: 2023-01-20 | Discharge: 2023-01-20 | Disposition: A | Discharge status: Home / Self Care | Payer: Medicare Other | Source: Ambulatory Visit | Attending: Family Medicine | Admitting: Family Medicine

## 2023-01-20 ENCOUNTER — Other Ambulatory Visit (HOSPITAL_BASED_OUTPATIENT_CLINIC_OR_DEPARTMENT_OTHER): Payer: Self-pay | Admitting: Family Medicine

## 2023-01-20 DIAGNOSIS — H6980 Other specified disorders of Eustachian tube, unspecified ear: Secondary | ICD-10-CM | POA: Diagnosis not present

## 2023-01-20 DIAGNOSIS — K589 Irritable bowel syndrome without diarrhea: Secondary | ICD-10-CM | POA: Diagnosis not present

## 2023-01-20 DIAGNOSIS — Z6821 Body mass index (BMI) 21.0-21.9, adult: Secondary | ICD-10-CM | POA: Diagnosis not present

## 2023-01-20 DIAGNOSIS — M25551 Pain in right hip: Secondary | ICD-10-CM | POA: Insufficient documentation

## 2023-01-20 DIAGNOSIS — F419 Anxiety disorder, unspecified: Secondary | ICD-10-CM | POA: Diagnosis not present

## 2023-01-20 DIAGNOSIS — R233 Spontaneous ecchymoses: Secondary | ICD-10-CM | POA: Diagnosis not present

## 2023-01-20 DIAGNOSIS — G479 Sleep disorder, unspecified: Secondary | ICD-10-CM | POA: Diagnosis not present

## 2023-01-20 DIAGNOSIS — J029 Acute pharyngitis, unspecified: Secondary | ICD-10-CM | POA: Diagnosis not present

## 2023-01-20 DIAGNOSIS — M419 Scoliosis, unspecified: Secondary | ICD-10-CM | POA: Diagnosis not present

## 2023-01-21 DIAGNOSIS — H04123 Dry eye syndrome of bilateral lacrimal glands: Secondary | ICD-10-CM | POA: Diagnosis not present

## 2023-01-27 DIAGNOSIS — Z7901 Long term (current) use of anticoagulants: Secondary | ICD-10-CM | POA: Diagnosis not present

## 2023-01-27 DIAGNOSIS — J029 Acute pharyngitis, unspecified: Secondary | ICD-10-CM | POA: Diagnosis not present

## 2023-01-27 DIAGNOSIS — G4733 Obstructive sleep apnea (adult) (pediatric): Secondary | ICD-10-CM | POA: Diagnosis not present

## 2023-01-27 DIAGNOSIS — K117 Disturbances of salivary secretion: Secondary | ICD-10-CM | POA: Diagnosis not present

## 2023-02-07 ENCOUNTER — Other Ambulatory Visit: Payer: Self-pay | Admitting: Cardiovascular Disease

## 2023-02-11 DIAGNOSIS — M25551 Pain in right hip: Secondary | ICD-10-CM | POA: Diagnosis not present

## 2023-02-16 ENCOUNTER — Other Ambulatory Visit (HOSPITAL_BASED_OUTPATIENT_CLINIC_OR_DEPARTMENT_OTHER): Payer: Self-pay | Admitting: Family Medicine

## 2023-02-16 DIAGNOSIS — F419 Anxiety disorder, unspecified: Secondary | ICD-10-CM | POA: Diagnosis not present

## 2023-02-16 DIAGNOSIS — G479 Sleep disorder, unspecified: Secondary | ICD-10-CM | POA: Diagnosis not present

## 2023-02-16 DIAGNOSIS — R9389 Abnormal findings on diagnostic imaging of other specified body structures: Secondary | ICD-10-CM

## 2023-02-16 DIAGNOSIS — R0602 Shortness of breath: Secondary | ICD-10-CM | POA: Diagnosis not present

## 2023-02-16 DIAGNOSIS — R194 Change in bowel habit: Secondary | ICD-10-CM | POA: Diagnosis not present

## 2023-02-16 DIAGNOSIS — Z6822 Body mass index (BMI) 22.0-22.9, adult: Secondary | ICD-10-CM | POA: Diagnosis not present

## 2023-02-20 ENCOUNTER — Ambulatory Visit (HOSPITAL_BASED_OUTPATIENT_CLINIC_OR_DEPARTMENT_OTHER)
Admission: RE | Admit: 2023-02-20 | Discharge: 2023-02-20 | Disposition: A | Discharge status: Home / Self Care | Payer: Medicare Other | Source: Ambulatory Visit | Attending: Family Medicine | Admitting: Family Medicine

## 2023-02-20 DIAGNOSIS — R9389 Abnormal findings on diagnostic imaging of other specified body structures: Secondary | ICD-10-CM | POA: Diagnosis not present

## 2023-02-20 DIAGNOSIS — J9811 Atelectasis: Secondary | ICD-10-CM | POA: Diagnosis not present

## 2023-02-20 DIAGNOSIS — J90 Pleural effusion, not elsewhere classified: Secondary | ICD-10-CM | POA: Diagnosis not present

## 2023-02-24 DIAGNOSIS — R159 Full incontinence of feces: Secondary | ICD-10-CM | POA: Diagnosis not present

## 2023-02-25 ENCOUNTER — Telehealth: Payer: Self-pay | Admitting: Cardiovascular Disease

## 2023-02-25 NOTE — Telephone Encounter (Signed)
Patient states that she has an issue with her heart and lungs. She says her PCP told her that they are discussing a procedure called thoracentesis. She says the nurse she spoke with said they would send information to her pulmonologist, but she wants to inform Dr. Tresa Endo to make him aware. She says it is all related and wants to have his opinion to get him involved. She says she does not know if any arrangements have been made yet or not.

## 2023-02-26 DIAGNOSIS — H04123 Dry eye syndrome of bilateral lacrimal glands: Secondary | ICD-10-CM | POA: Diagnosis not present

## 2023-02-26 NOTE — Telephone Encounter (Signed)
Call to [facility] (PCP) for information.  They will fax last OV note to [place] describing POC Call to patient and LVM that we will receive paperwork from PCP and send to Dr Tresa Endo so he is aware.  Advised if any further questions to call our office.

## 2023-02-27 ENCOUNTER — Telehealth: Payer: Self-pay | Admitting: Internal Medicine

## 2023-02-27 DIAGNOSIS — R300 Dysuria: Secondary | ICD-10-CM | POA: Diagnosis not present

## 2023-02-27 DIAGNOSIS — R3989 Other symptoms and signs involving the genitourinary system: Secondary | ICD-10-CM | POA: Diagnosis not present

## 2023-02-27 NOTE — Telephone Encounter (Signed)
She was discharged from follow-up because she wanted to simplify her care.  It appears the primary care ordered a CT scan of the chest.  This is abnormal.  For some reason radiologist sent me the report.  Please talk to her and see if her primary care physician is going to address these abnormalities so she wants me to primary care physician and is Tenny Craw, Darlen Round, MD    rative & Impression  CLINICAL DATA:  Follow up abnormal findings on prior imaging test. Multiple complaints. No given history of malignancy.   EXAM: CT CHEST WITHOUT CONTRAST   TECHNIQUE: Multidetector CT imaging of the chest was performed following the standard protocol without IV contrast.   RADIATION DOSE REDUCTION: This exam was performed according to the departmental dose-optimization program which includes automated exposure control, adjustment of the mA and/or kV according to patient size and/or use of iterative reconstruction technique.   COMPARISON:  Chest CT 04/09/2022 and 10/09/2021. Radiographs 12/02/2021.   FINDINGS: Cardiovascular: Atherosclerosis of the aorta, great vessels and coronary arteries, similar to previous study. There are aortic valvular calcifications. Stable mild cardiomegaly and central enlargement of the pulmonary arteries. No significant pericardial effusion.   Mediastinum/Nodes: There are no enlarged mediastinal, hilar or axillary lymph nodes.Scattered small mediastinal lymph nodes are similar to the previous study. Hilar assessment is limited by the lack of intravenous contrast, although the hilar contours appear unchanged. The thyroid gland, trachea and esophagus demonstrate no significant findings.   Lungs/Pleura: No interval change from the most recent study in the moderate partially loculated right and small dependent left pleural effusions. Little change in appearance of the lungs with central airway thickening, mosaic attenuation and compressive atelectasis in both  lower lobes. There is chronic atelectasis or scarring in the right middle lobe and lingula. There is a new small focus of airspace disease or atelectasis medially in the left upper lobe (image 48/4). No suspicious pulmonary nodules.   Upper abdomen: The visualized upper abdomen appears stable, without significant findings. Unchanged thickening of the left adrenal gland and probable small cyst in the upper pole of the left kidney for which no follow-up imaging is recommended.   Musculoskeletal/Chest wall: There is no chest wall mass or suspicious osseous finding. Multilevel spondylosis associated with a convex right thoracolumbar scoliosis.   IMPRESSION: 1. No significant change in the moderate partially loculated right and small dependent left pleural effusions with associated compressive atelectasis in both lower lobes. Consider thoracentesis if the etiology for these effusions is unknown. 2. New small focus of airspace disease or atelectasis medially in the left upper lobe. 3. No suspicious pulmonary nodules. 4. Stable cardiomegaly and central enlargement of the pulmonary arteries consistent with underlying pulmonary arterial hypertension. 5. Stable small mediastinal lymph nodes, likely reactive. 6.  Aortic Atherosclerosis (ICD10-I70.0).     Electronically Signed   By: Carey Bullocks M.D.   On: 02/25/2023 14:32      Result History

## 2023-03-02 ENCOUNTER — Ambulatory Visit (INDEPENDENT_AMBULATORY_CARE_PROVIDER_SITE_OTHER): Payer: Medicare Other | Admitting: Internal Medicine

## 2023-03-02 ENCOUNTER — Encounter: Payer: Self-pay | Admitting: Internal Medicine

## 2023-03-02 ENCOUNTER — Telehealth: Payer: Self-pay | Admitting: Internal Medicine

## 2023-03-02 ENCOUNTER — Ambulatory Visit (INDEPENDENT_AMBULATORY_CARE_PROVIDER_SITE_OTHER): Payer: Medicare Other

## 2023-03-02 VITALS — BP 110/78 | HR 74 | Ht 60.0 in | Wt 116.8 lb

## 2023-03-02 DIAGNOSIS — R0609 Other forms of dyspnea: Secondary | ICD-10-CM

## 2023-03-02 DIAGNOSIS — J90 Pleural effusion, not elsewhere classified: Secondary | ICD-10-CM

## 2023-03-02 DIAGNOSIS — R0600 Dyspnea, unspecified: Secondary | ICD-10-CM | POA: Diagnosis not present

## 2023-03-02 DIAGNOSIS — I2699 Other pulmonary embolism without acute cor pulmonale: Secondary | ICD-10-CM

## 2023-03-02 DIAGNOSIS — J479 Bronchiectasis, uncomplicated: Secondary | ICD-10-CM | POA: Diagnosis not present

## 2023-03-02 DIAGNOSIS — I272 Pulmonary hypertension, unspecified: Secondary | ICD-10-CM

## 2023-03-02 LAB — CBC WITH DIFFERENTIAL/PLATELET
Basophils Absolute: 0.3 10*3/uL — ABNORMAL HIGH (ref 0.0–0.1)
Basophils Relative: 5.3 % — ABNORMAL HIGH (ref 0.0–3.0)
Eosinophils Absolute: 0.1 10*3/uL (ref 0.0–0.7)
Eosinophils Relative: 1.1 % (ref 0.0–5.0)
HCT: 44.1 % (ref 36.0–46.0)
Hemoglobin: 14.2 g/dL (ref 12.0–15.0)
Lymphocytes Relative: 17.4 % (ref 12.0–46.0)
Lymphs Abs: 1.1 10*3/uL (ref 0.7–4.0)
MCHC: 32.3 g/dL (ref 30.0–36.0)
MCV: 93.9 fl (ref 78.0–100.0)
Monocytes Absolute: 0.8 10*3/uL (ref 0.1–1.0)
Monocytes Relative: 13.2 % — ABNORMAL HIGH (ref 3.0–12.0)
Neutro Abs: 3.9 10*3/uL (ref 1.4–7.7)
Neutrophils Relative %: 63 % (ref 43.0–77.0)
Platelets: 233 10*3/uL (ref 150.0–400.0)
RBC: 4.7 Mil/uL (ref 3.87–5.11)
RDW: 14 % (ref 11.5–15.5)
WBC: 6.1 10*3/uL (ref 4.0–10.5)

## 2023-03-02 LAB — BASIC METABOLIC PANEL
BUN: 20 mg/dL (ref 6–23)
CO2: 31 mEq/L (ref 19–32)
Calcium: 9.6 mg/dL (ref 8.4–10.5)
Chloride: 95 mEq/L — ABNORMAL LOW (ref 96–112)
Creatinine, Ser: 1.28 mg/dL — ABNORMAL HIGH (ref 0.40–1.20)
GFR: 37.28 mL/min — ABNORMAL LOW (ref >60.00)
Glucose, Bld: 106 mg/dL — ABNORMAL HIGH (ref 70–99)
Potassium: 3.9 mEq/L (ref 3.5–5.1)
Sodium: 137 mEq/L (ref 135–145)

## 2023-03-02 LAB — BRAIN NATRIURETIC PEPTIDE: Pro B Natriuretic peptide (BNP): 1382 pg/mL — ABNORMAL HIGH (ref 0.0–100.0)

## 2023-03-02 NOTE — Telephone Encounter (Signed)
Let [name] BNP a marker of cardiac congestion has gone up significantly. Last echo march 2023  Plan  Get another echo She needs to see Dr Daphene Jaeger (I  have notified him) or his associate - next appt with him Is sept but should see sooner a PA    Latest Reference Range & Units 05/11/10 04:36 11/29/12 17:36 11/29/12 22:41 12/04/14 13:03 08/19/15 09:43 12/03/16 08:45 06/30/17 21:20 07/01/17 04:32 07/02/17 04:18 12/05/17 20:29 02/11/21 11:37 10/09/21 15:13 12/02/21 17:52 12/02/21 18:05 12/03/21 05:32 12/04/21 05:37 01/02/22 11:17 01/29/22 16:06 05/14/22 10:35 06/06/22 09:29 09/15/22 16:04 11/06/22 11:41 03/02/23 11:41  Creatinine 0.40 - 1.20 mg/dL 1.19 1.47 8.29 5.62 1.30 0.85 0.86 0.82 0.95 0.87 0.89 1.10 (H) 0.87 0.90 0.97 1.13 (H) 1.01 (H) 1.05 (H) 1.01 (H) 1.07 (H) 1.07 (H) 1.08 (H) 1.28 (H)  (H): Data is abnormally high   Latest Reference Range & Units 11/29/12 17:36 04/17/22 14:07 03/02/23 11:41  Pro B Natriuretic peptide (BNP) 0.0 - 100.0 pg/mL 178.6 870.0 (H) 1,382.0 (H)  (H): Data is abnormally high

## 2023-03-02 NOTE — Progress Notes (Signed)
OV 10/22/2021  Subjective:  Patient ID: Janet Nguyen, female , DOB: 13-Nov-1931 , age 89 y.o. , MRN: [id number] , ADDRESS: [address] [place] [address] PCP [name], MD Patient Care Team: [name], MD as PCP - General (Family Medicine) Lennette Bihari, MD as PCP - Cardiology (Cardiology)  This Provider for this visit: Treatment Team:  Attending Provider: Kalman Shan, MD    10/22/2021 -   Chief Complaint  Patient presents with   Consult    Pt had a recent CT performed which is the reason for today's visit.     HPI Janet Nguyen 89 y.o. -referred for shortness of breath and cough.  Has known severe scoliosis.  Found to have bronchiectasis on the CT chest.  History is provided by the patient and review of the medical records.  She is a very good historian.  She is divorced.  She has grown children who all live all over the country.  She has several grandchildren.  She says she is known to have deviated nasal septum, celiac disease for which she is on a restrictive diet, sleep apnea but she cannot tolerate CPAP.  She is most importantly known to have severe scoliosis that was first diagnosed in her younger years.  She says after age 60 is around when they made the diagnosis although she suspect she has had a longer period she is just on observation treatment.  She did yoga and severe exercise to keep herself physically fit.  Over the years scoliosis is gotten worse that she is lost 6 inches in total height.  This is producing physical pressure effects.  She thinks with the onset of the pandemic and particularly with worsening of the scoliosis and particularly for the last few months or so she said dyspnea on exertion [echo did show grade 2 diastolic dysfunction], decreased socializing, decreased sleep, feeling depressed.  Also she feels her migraines are worse.  She when she wakes up in the morning she feels an elephant is on the chest.   She also has a cough.  The respiratory symptoms are mild overall.  But she does definitely have it and is definitely new.  Sometime back a year ago or 2 years ago she is to be able to walk at least a mile now she gets dyspneic walking a block.  Of note she reports a few episodes of severe vomiting acute episodes 2 times in 2022.  Idiopathic.  There is associated heartburn  She has had the COVID-vaccine but does not have the COVID itself  Review of system positive for fatigue for the last several months arthralgia for the last several years.  Denies any family history of lung disease  Denies any substance abuse  Lives in a 87 year old home.  Detailed review shows no organic dust antigen exposure.  She does have a bird feeder.  Occupational history: Detail organic and inorganic antigen exposure history is negative    CT Chest data 10/09/21 -    IMPRESSION: 1. 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 Chronic aspiration could have a similar appearance. 2. Part solid nodule of the right lower lobe measuring 7.5 mm in mean diameter with 4 mm solid component. Follow-up non-contrast CT recommended at 3-6 months to confirm persistence. If  unchanged, and solid component remains <6 mm, annual CT is recommended until 5 years of stability has been established. If persistent these nodules should be considered highly suspicious if the solid component of the nodule is 6 mm or greater in size and enlarging. This recommendation follows the consensus statement: Guidelines for Management of Incidental Pulmonary Nodules Detected on CT Images: From the Fleischner Society 2017; Radiology 2017; 284:228-243. 3.  Aortic Atherosclerosis (ICD10-I70.0).     Electronically Signed   By: Allegra Lai  M.D.   On: 10/09/2021 15:34  No results found.   11/06/2021 Follow up : PE   89 yo female never smoker seen for pulmonary consult 10/22/21 for bronchiectasis and shortness of breath found to have Pulmonary embolism on VQ scan .  Medical history of severe scoliosis /kyphosis , OSA -CPAP intolerant   TEST/EVENTS :  VQ scan 10/25/21- multiple moderate perfusion defects with RML/RLL + PE.  Venous Doppler 09/2021 neg  Echo Nml EF , Gr 2 DD, Elevated PAP . Severe LAE and Mod RA, mod to severe MR   Patient returns for a 1 week follow up . Recently seen for pulmonary consult for bronchiectasis and shortness of breath. Labs showed elevated  D Dimer . Subsequent VQ scan on 10/25/21 was positive for PE. 2 D echo showed mild elevated PAP at , Gr 2 DD , Severe LAE and Mod RA, mod to severe MR. Venous doppler was negative for DVT.  Has some minimally productive dry cough. Was started on Spiriva but did not take.  She was started on Eliquis , she has started on starter pack .  Unfortunately patient's insurance does not cover and prescription is greater than $600.  We discussed patient assistance program.  We have also contacted our pharmacy department to help [place] with alternative options.  Patient education on anticoagulation therapy.  Patient says she has some minimum daily cough.  She was started on Spiriva last visit.  Unfortunately did not start this.  We discussed the reason for Spiriva.  Went over her recent CT chest that shows some bronchiectasis.  Autoimmune labs did show elevated rheumatoid factor and ANA, CCP was negative.  Patient has some mild arthritis but no significant joint swelling.  Sjogren's panel was negative. Typically active with light walking. Limited with back pain with scoliosis.  Lives alone. Drives. Does light housework , shopping. Has 4 kids, lots of grandkids.   11/22/2021 Follow Up PE/ Bronchiectasis/ Exertional dyspnea  Janet Nguyen is a 89 y.o. female never  smoker seen for pulmonary consult 10/22/21 for bronchiectasis and shortness of breath. Additionally she was  found to have Pulmonary embolism on VQ scan . PE is unprovoked.  May need to consider lifelong anticoagulation or at least for 6 months to 1 year of therapy.  Will need to weigh the risk and benefit ratio.She is followed by Dr. Marchelle Gearing   Pt. Presents for follow up . She states she has been doing well. She has been compliant with her  Eliquis. No bleeding.  She does not use NSAIDS. We are working on getting her reduced cost medication through the pharmacy. She understands she cannot skip a dose of Eliquis. .We reviewed bleeding precautions and safe use of sharp objects, in addition to fall precautions. She denies any lower extremity swelling.No bruising noted  She does continue to have exertional dyspnea.  She states she has been compliant with her Spiriva. She states she is unsure if it is helping or not, but is  willing to try this for a bit longer.    She is using her flutter valve 8-10 times a day. 2-4 blows at a time.She states she has not been able to cough anything up. We will add Mucinex to see if this helps. We discussed that this is a daily maintenance for her bronchiectasis. She is very compliant. She has had many questions today, that we have addressed.    PFT's look restrictive, which is expected with her degree of scoliosis.  DLCO with mild/Moderate reduction  Lung volumes are slightly reduce  OV 11/28/2021  Subjective:  Patient ID: Janet Nguyen, female , DOB: 1932/08/19 , age 86 y.o. , MRN: [id number] , ADDRESS: [address] [place] [address] PCP [name], MD Patient Care Team: [name], MD as PCP - General (Family Medicine) Lennette Bihari, MD as PCP - Cardiology (Cardiology)  This Provider for this visit: Treatment Team:  Attending Provider: Kalman Shan, MD    11/28/2021 -   Chief Complaint  Patient presents with    Follow-up    Pt states she has been having problems with the cost of the Eliquis medication. States she took her last pill today 3/2. States that she has been more SOB than before.   #Multifactorial dyspnea  -Scoliosis, bronchiectasis seen on CT scan, mitral valve regurgitation grade 2 diastolic dysfunction and pulmonary embolism January 2023   #pulm embolism diagnosed January 2023 through VQ scan and high D-dimer  -On outpatient Eliquis  #Bronchiectasis diagnosis given genera 2023  -On Spiriva and flutter valve  #Right lower lobe lung nodule new 7.5 mm January 2023  -On surveillance  #Positive rheumatoid factor new diagnosis January 2023  -On surveillance   HPI SAYDEE RIRIE 89 y.o. -returns for follow-up.  I saw her in January 2023.  At that time diagnosis of pulmonary embolism was made.  She is here for follow-up.  Since then she is seen nurse practitioner.  She is taking Eliquis.  Recently today she is her last day of Eliquis.  She needs to pay for Eliquis.  It is over $500.  She is struggle to come to this decision.  Pharmacy team has been in communication with me.  We had a conversation.  At first she said she did not want to pay for the Eliquis.  It appears to have done extensive research and only warfarin would be cheap and affordable.  We went through the relative risk can inconvenience features between DOAC and Warfarin.  She then decided to go with DOAC and making the more expensive payment.  She then also discussed the difference between Eliquis and Xarelto.  She believes Xarelto could be risky compared to Eliquis.  So she decided to stick with Eliquis but she asked the pharmacy team to investigate and give her an updated information.    She does have other complaints.  She is concerned about her carotid artery.  In 2017 she might of had some mild carotid stenosis according to the ultrasound report.  I asked her to follow-up with primary care physician.  She wanted to discuss  cardiac issues of mitral valve regurgitation and grade 2 diastolic dysfunction.  Have asked her to discuss this with Dr. Tresa Endo but did indicate that it is contributing to shortness of breath.  She has positive rheumatoid factor and we said we will address it at a future visit.  She does have lung nodules and she is due for a follow-up CT in  the spring 2023.  She has bronchiectasis and she said the Spiriva and flutter valve might not be helping but for now she is going to continue those.    CT Chest data  No results found.       OV 01/24/2022  Subjective:  Patient ID: Janet Nguyen, female , DOB: Jul 28, 1932 , age 81 y.o. , MRN: [id number] , ADDRESS: [address] [place] [address] PCP [name], MD Patient Care Team: [name], MD as PCP - General (Family Medicine) Lennette Bihari, MD as PCP - Cardiology (Cardiology)  This Provider for this visit: Treatment Team:  Attending Provider: Kalman Shan, MD    01/24/2022 -   Chief Complaint  Patient presents with   Follow-up    Pt states she has been doing okay since last visit. States she has been in and out of the hospital since last visit.    #Multifactorial dyspnea  -Scoliosis, bronchiectasis seen on CT scan, mitral valve regurgitation grade 2 diastolic dysfunction, atrial flutter and pulmonary embolism January 2023   #pulm embolism diagnosed January 2023 through VQ scan and high D-dimer  -On outpatient Eliquis  #Bronchiectasis diagnosis given genera 2023  -On Spiriva and flutter valve  #Right lower lobe lung nodule new 7.5 mm January 2023  -On surveillance  #Positive rheumatoid factor new diagnosis January 2023  -On surveillance  HPI Janet Nguyen 89 y.o. -returns for follow-up.  She continues on Eliquis.  She is tolerating it well.  Issues that it is very expensive she is spending more than $5 per month.  She is going to work with [employer] company trying to reduce the  cost.  I talked about limiting the Eliquis to 6 months and then going on baby aspirin based on D-dimer.  However she says she wants to work with [employer] and try to reduce the cost.  She prefers to take Eliquis as long as medically indicated.  Shortness of breath wise she is stable.  She recently was admitted for diastolic dysfunction heart failure and atrial flutter in March 2023.  I reviewed the chart.  This followed a 10 pound weight gain.  She is back to feeling baseline.    Regarding her rheumatoid factor positivity: She says she does not have much of morning stiffness and joint mobility is pretty good therefore she wants to hold off on seeing rheumatologist.    Regarding emphysem and mild bronchiectasis: She is again not using the Spiriva and flutter valve.  She again tells [place] that she has not been instructed clearly.  We actually had her for a second time and had to give instructions.  She does not recommend this.  The CMA went in and showed her the devices Spiriva Respimat.  Then she remembered it.  We gave her instructions again.  She wants to try empiric Spiriva for a time-limited trial along with flutter valve and then see if this helps her shortness of breath  Lung nodule: She has not had a 61-month CT and April 2023.  Radiologist recommended endograft 3-22-month so we will get it accordingly.        OV 04/17/2022  Subjective:  Patient ID: Janet Nguyen, female , DOB: [birth year] , age 82 y.o. , MRN: [id number] , ADDRESS: [address] [place] [address] PCP [name], MD Patient Care Team: [name], MD as PCP - General (Family Medicine) Lennette Bihari, MD as PCP - Cardiology (Cardiology)  This Provider for this visit: Treatment Team:  Attending Provider: Kalman Shan, MD   04/17/2022 -   Chief Complaint  Patient presents with   Follow-up    Patient here to go over CT results.      HPI Janet Nguyen 89 y.o. -returns  for follow-up.  She continues on full dose Eliquis.  She is complaining about the cost of Eliquis.  She is wondering if this can be reduced.  She has completed 6 months of full dose treatment for pulmonary embolism.  However in the interim she has atrial fibrillation and she is on Eliquis for that.  I have return to Dr. Tresa Endo about this.  However she is continuing to have significant amount of symptoms.  She feels frustrated.  For.  She feels that nobody is listening to her and helping her address her symptoms.  However records indicate otherwise.  She also tells me that in terms of   #shortness of breath -this is not worse after the cardioversion.  She is unable to walk a few feet.  Particularly after April 2023.  Her weight was 113 pounds in April currently at 116 pounds but the weight fluctuates she states.  Also in the last 1 month  #Worsening pedal edema.  She is on Lasix and this is despite that.  She has significant varicose veins.  #Nonspecific side effects of Eliquis apart from being expensive she feels that she is "bleeding all over and she shows a body but there is no bruising or anything.  She pointed out to some discrete small macular heads of skin lesions and she says it is because of Eliquis.  #Sinus says she feels this is acting up.  She has palpitations.  She also has irritable bowel syndrome.  #Bronchiectasis she is already using a flutter valve.  She states we did not educate her currently.  Records show otherwise.  She is also not using her Spiriva.  She says she ran out.  #Lung nodule: She had CT scan of the chest shows the lung nodules resolved/very small.    CT Chest data 04/09/2022  Narrative & Impression  CLINICAL DATA:  Lung nodule.   EXAM: CT CHEST WITHOUT CONTRAST   TECHNIQUE: Multidetector CT imaging of the chest was performed following the standard protocol without IV contrast.   RADIATION DOSE REDUCTION: This exam was performed according to the departmental  dose-optimization program which includes automated exposure control, adjustment of the mA and/or kV according to patient size and/or use of iterative reconstruction technique.   COMPARISON:  10/09/2021, 07/01/2017.   FINDINGS: Cardiovascular: Atherosclerotic calcification of the aorta, aortic valve and coronary arteries. Enlarged pulmonic trunk and heart. No pericardial effusion.   Mediastinum/Nodes: Mediastinal lymph nodes are not enlarged by CT size criteria and appear similar to prior. Hilar regions are difficult to definitively evaluate without IV contrast. No axillary adenopathy. Esophagus is grossly unremarkable.   Lungs/Pleura: Scattered mucoid impaction. Central bronchiectasis. Scarring in the right middle lobe and lingula. New bilateral pleural effusions, moderate to large on the right and small to moderate on the left. Patchy ground-glass in the adjacent right lower lobe. Basilar septal thickening. Pulmonary nodules measure 4 mm or less in size, as on 10/09/2021 and likely benign. Previously described part solid nodule in right lower lobe is not visualized. Airway is unremarkable.   Upper Abdomen: Visualized portions of the liver and right adrenal gland are unremarkable. Left adrenal thickening, as before, no follow-up necessary. 1.8 cm fluid density lesion  in the upper pole left kidney, indicative of a cyst. No follow-up necessary. Visualized portions of the spleen, pancreas and stomach are unremarkable. Left lumbar hernia contains unobstructed colon.   Musculoskeletal: Degenerative changes in the spine with scoliosis. No worrisome lytic or sclerotic lesions.   IMPRESSION: 1. Previously described part solid right lower lobe nodule is not visualized. 2. Congestive heart failure. 3. Aortic atherosclerosis (ICD10-I70.0). Coronary artery calcification. 4. Enlarged pulmonic trunk, indicative of pulmonary arterial hypertension.     Electronically Signed   By:  Leanna Battles M.D.   On: 04/10/2022 15:07      No results found.   OV 07/21/2022  Subjective:  Patient ID: Janet Nguyen, female , DOB: 31-Dec-1931 , age 89 y.o. , MRN: [id number] , ADDRESS: [address] [place] [address] PCP [name], MD Patient Care Team: [name], MD as PCP - General (Family Medicine) Lennette Bihari, MD as PCP - Cardiology (Cardiology) Duke, Roe Rutherford, PA as Physician Assistant (Cardiology)  This Provider for this visit: Treatment Team:  Attending Provider: Kalman Shan, MD    07/21/2022 -   Chief Complaint  Patient presents with   Follow-up    PT wants instructions on how to use flutter and inhaler States itchy skin, swollen legs, SOB for months Brought list of problems to discuss with Provider    HPI Janet Nguyen 89 y.o. -returns for follow-up.  In this visit she is frustrated by all her health issues.  She is frustrated that doctors are not like the way it used to be many years ago.  She feels her problems are not being addressed.  She feels she does not have a good handle on her health issues because of lack of clear-cut communication from healthcare providers.  I did remind her that all the issues have been addressed and talk to her.  She had a list of questions all of which she asked in the past and we were answered.  I did wonder about that.  It appears the fact she lives alone is causing excess distress to her because she is 89 years old.  She acknowledged that when asked her about it.  She has 2 children living in [place] but it appears that the relationship may not be tight.  There is a daughter who is nearing retirement living in [place] which checks in on her more frequently.  However the son in [place] Alyjah Milward is the healthcare power of attorney.  They have never come for the visit but she says she has appraised them.  Her current symptoms are listed below.  I told her that I  believe she is currently at baseline and the best way forward is not expected to go of her condition but she needs to learn to accept what Is permanent baseline symptoms and the fact some of the symptoms might not be reversible.  She needs to learn to recognize variation and then report on the variation.  Based on that she fill out the [facility] symptom assessment score.  Also gave her a sheet to take home so she can monitor her status.  In terms of her shortness of breath this is stable  In terms of anticoagulation she is taking Eliquis.  It is now at low-dose in September 2023.  She wants to know if she can stop it.  I told her January 2024 it will be a year since pulmonary embolism and she can probably stop it  but she has atrial flutter and therefore the long-term decision will now depend on Dr. Tresa Endo  She is worried about recurrent epistaxis because of the Eliquis.  She had severe epistaxis many years ago and ended up in the emergency room.  Some weeks ago she had 1 small bout of epistaxis versus oral bleeding.  She brought a pillow covered to show that.  Since then it has not recurred.  I told her if it recurs she can call us back or Dr. Tresa Endo.   In terms of her pedal edema: The right lower extremity is more swollen than the left.  Did explain to her the multifactorial nature of this.  She is not interested in getting a Doppler of the leg.  It is better than before and therefore we opted to watch this   Lung nodule: She had a previous lung nodule that resolved.  She did find out the area where she has been living for 34 years there is increased rate on exposure.  At this point in time we resolved just to monitor this.       OV 10/30/2022  Subjective:  Patient ID: Janet Nguyen, female , DOB: [birth year] , age 70 y.o. , MRN: [id number] , ADDRESS: [address] [place] [address] PCP [name], MD Patient Care Team: [name], MD as PCP - General (Family  Medicine) Lennette Bihari, MD as PCP - Cardiology (Cardiology) Duke, Roe Rutherford, PA as Physician Assistant (Cardiology)  This Provider for this visit: Treatment Team:  Attending Provider: Kalman Shan, MD    10/30/2022 -   Chief Complaint  Patient presents with   Follow-up    C/o R lower leg and foot swelling.  Thinks Eliquis is causing this.  SOB has improved over last year.  Does not remember ever taking Spiriva Respimat.     HPI Janet Nguyen 89 y.o. -returns for follow-up.  She tells me that she is actually doing better.  She is completed 1 year of Eliquis with low-dose in September 2023.  All her EKGs on external record review with Dr. Nicki Guadalajara since August 2023 through December 2023 still show atrial fibrillation.  Her most recent visit with Dr. Tresa Endo was in December 2023 and she still had atrial fibrillation.  She really wants to come off the Eliquis.  She is completed 1 year for a pulmonary embolism treatment but she now needs it for her atrial fibrillation.  I did indicate to her that she can stop her Eliquis from a pulmonary embolism standpoint but she needs it for atrial fibrillation because of stroke risk and she will have to discuss this with Dr. Tresa Endo.  I did indicate to her that she is only on a low-dose but she feels there is bruising on her back and easy bruising.  She showed me some pictures.  She believes it is coming from Eliquis.  I have deferred the indication for Eliquis to Dr. Nicki Guadalajara.  From a shortness of breath standpoint she is much better she just gets fatigued because of aging she feels.  There is no cough or wheezing.  She is only on supportive care.  She has bronchiectasis but does not do Spiriva.  She feels it is at risk for cancer but we have kept CT scan follow-up on an expectant approach.  Because she is feeling better she actually wants to switch to expectant follow-up.  I am supportive of this.  She did appreciate me  for the care.  She also  giving feedback to have actually improved with my bedside manners in the last 1 year.  She feels physicians need to spend more time.  We are seeing her 30-minute slots and this has helped her outpatient satisfaction.       OV 03/02/2023  Subjective:  Patient ID: Janet Nguyen, female , DOB: 05/04/1932 , age 75 y.o. , MRN: [id number] , ADDRESS: [address] [place] [address] PCP [name], MD Patient Care Team: [name], MD as PCP - General (Family Medicine) Lennette Bihari, MD as PCP - Cardiology (Cardiology) Duke, Roe Rutherford, PA as Physician Assistant (Cardiology)  This Provider for this visit: Treatment Team:  Attending Provider: Kalman Shan, MD    03/02/2023 -   Chief Complaint  Patient presents with   Follow-up    F/up sob, chest tightness, pain, cough, wheezing.     #Multifactorial dyspnea  -Scoliosis, bronchiectasis seen on CT scan, mitral valve regurgitation grade 2 diastolic dysfunction, atrial flutter and pulmonary embolism January 2023   #pulm embolism diagnosed January 2023 through VQ scan and high D-dimer [normal Dopplers] #Atrial flutter managed by Dr. Tresa Endo  -On outpatient Eliquis -since January 2023: Switch to low-dose Eliquis September 2023 for risk mitigation  #Bronchiectasis diagnosis given genera 2023   - Spiriva samples given earlier in the year 2023 but did not use due to cost and lack of efficacy  -Flutter valve recommended but only with sporadic resultant use.  #Radon exposure #Right lower lobe lung nodule new 7.5 mm January 2023    -On surveillance/expectant follow-up   - no nodules reported July 2023 AND MAy 2024  #Positive rheumatoid factor new diagnosis January 2023  -On surveillance  #Remote history of epistaxis resulting in emergency room visit posing a concern for patient on Eliquis  #Chronic pedal edema with decompensation summer 2023 right greater than left -Long history of varicose veins and  cor pulmonale considered etiologies  #New onset right greater than left small to moderate pleural effusions July 2023 [absent in January 2023)  HPI Janet Nguyen 89 y.o. -returns for follow-up.  At the last visit she discharged herself from follow-up.  It appears that some of the physician presumably primary care physician got a CT scan of the chest.  This showed persistence of the pleural effusions and therefore she is here for follow-up.  She tells me that overall she is doing poorly.  She says ever since she started Eliquis she is having punctate lesions in her skin that look like bug bites.  She feels that because of the Eliquis but she is tolerating it and taking the Eliquis because of atrial fibrillation.  Overall she feels she has less energy.  In fact on the [place] symptom assessment scale energy scores are slightly worse.  But more importantly she has worsening shortness of breath.  Definitely shortness of breath scores are worse.  However other symptoms are slightly better.  She also feels that her lower extremity edema is worse [she has significant varicose veins].  She is on torsemide.  She is seen cardiology.  Along with her shortness of breath is some wheezing as well.  She is unable to do groceries.  She is frustrated by all this.  Social - She continues to live alone.  There is a son from [place] visits to every other month and does some cooking for her.  There is a son who lives locally and is retired.  She told me today that she is of English descent.  Her parents were born around [place].  All her cousins lived in [place].  Her parents immigrated to the [place] in the 1920s.  Other issues - She has anorectal prolapse.  We talked about pleural effusion.  She wondered if these things are related.  Answered in the negative.     [place] Symptom Assessment Numerical Scale 0 is no problem -> 10 worst problem 07/21/2022   03/02/2023   No Pain -> Worst pain 0 5  No  Tiredness -> Worset tiredness 4 5  No Nausea -> Worst nausea 2 0  No Depression -> Worst depression 8 5  No Anxiety -> Worst Anxiety 8 5  No Drowsiness -> Worst Drowsiness 8 3  Best appetite-> Worst Appetitle 4 1  Best Feeling of well being -> Worst feeling 8 1  No dyspnea-> Worst dyspnea 2 8  Other problem (none -> severe) RLE > LLE Edema   Othre issues Epistaxis mild x 1 6  Completed by  patoient         CT Chest data 02/20/23   Narrative & Impression  CLINICAL DATA:  Follow up abnormal findings on prior imaging test. Multiple complaints. No given history of malignancy.   EXAM: CT CHEST WITHOUT CONTRAST   TECHNIQUE: Multidetector CT imaging of the chest was performed following the standard protocol without IV contrast.   RADIATION DOSE REDUCTION: This exam was performed according to the departmental dose-optimization program which includes automated exposure control, adjustment of the mA and/or kV according to patient size and/or use of iterative reconstruction technique.   COMPARISON:  Chest CT 04/09/2022 and 10/09/2021. Radiographs 12/02/2021.   FINDINGS: Cardiovascular: Atherosclerosis of the aorta, great vessels and coronary arteries, similar to previous study. There are aortic valvular calcifications. Stable mild cardiomegaly and central enlargement of the pulmonary arteries. No significant pericardial effusion.   Mediastinum/Nodes: There are no enlarged mediastinal, hilar or axillary lymph nodes.Scattered small mediastinal lymph nodes are similar to the previous study. Hilar assessment is limited by the lack of intravenous contrast, although the hilar contours appear unchanged. The thyroid gland, trachea and esophagus demonstrate no significant findings.   Lungs/Pleura: No interval change from the most recent study in the moderate partially loculated right and small dependent left pleural effusions. Little change in appearance of the lungs with  central airway thickening, mosaic attenuation and compressive atelectasis in both lower lobes. There is chronic atelectasis or scarring in the right middle lobe and lingula. There is a new small focus of airspace disease or atelectasis medially in the left upper lobe (image 48/4). No suspicious pulmonary nodules.   Upper abdomen: The visualized upper abdomen appears stable, without significant findings. Unchanged thickening of the left adrenal gland and probable small cyst in the upper pole of the left kidney for which no follow-up imaging is recommended.   Musculoskeletal/Chest wall: There is no chest wall mass or suspicious osseous finding. Multilevel spondylosis associated with a convex right thoracolumbar scoliosis.   IMPRESSION: 1. No significant change in the moderate partially loculated right and small dependent left pleural effusions with associated compressive atelectasis in both lower lobes. Consider thoracentesis if the etiology for these effusions is unknown. 2. New small focus of airspace disease or atelectasis medially in the left upper lobe. 3. No suspicious pulmonary nodules. 4. Stable cardiomegaly and central enlargement of the pulmonary arteries consistent with underlying pulmonary arterial hypertension. 5. Stable small mediastinal lymph nodes, likely reactive. 6.  Aortic Atherosclerosis (ICD10-I70.0).     Electronically Signed   By: Carey Bullocks M.D.   On: 02/25/2023 14:32      No results found.    PFT     Latest Ref Rng & Units 10/22/2021   11:26 AM  PFT Results  FVC-Pre L 1.79   FVC-Predicted Pre % 89   FVC-Post L 1.78   FVC-Predicted Post % 89   Pre FEV1/FVC % % 73   Post FEV1/FCV % % 76   FEV1-Pre L 1.30   FEV1-Predicted Pre % 89   FEV1-Post L 1.36   DLCO uncorrected ml/min/mmHg 10.42   DLCO UNC% % 61   DLCO corrected ml/min/mmHg 10.42   DLCO COR %Predicted % 61   DLVA Predicted % 84   TLC L 4.21   TLC % Predicted % 89   RV %  Predicted % 101        has a past medical history of Arrhythmia, Atrial flutter (HCC), Celiac disease, GERD (gastroesophageal reflux disease), Hypertension, Intervertebral disc stenosis of neural canal of cervical region, Irregular heart beat (11/30/2012), Osteoporosis, PMR (polymyalgia rheumatica) (HCC), Scoliosis, Scoliosis, and Sleep apnea (10/02/11 [facility]).   reports that she has never smoked. She has never used smokeless tobacco.  Past Surgical History:  Procedure Laterality Date   APPENDECTOMY     ruptured at age 41 and had surgery   CARDIAC CATHETERIZATION  01/27/06   CARDIOVERSION N/A 01/08/2022   Procedure: CARDIOVERSION;  Surgeon: Meriam Sprague, MD;  Location: [facility] ENDOSCOPY;  Service: Cardiovascular;  Laterality: N/A;   cataract surgery  2015   Dr. Elmer Picker; March & April 2015    Allergies  Allergen Reactions   Gluten Meal Other (See Comments)    Unknown   Naproxen Other (See Comments)    Stomach upset    Immunization History  Administered Date(s) Administered   Fluad Quad(high Dose 65+) 07/11/2022   Influenza Split 06/26/2014   Influenza, High Dose Seasonal PF 06/15/2015, 06/19/2016, 07/24/2017, 05/24/2019, 06/20/2021   Influenza,inj,quad, With Preservative 06/29/2018   Influenza-Unspecified 06/30/2013   PFIZER(Purple Top)SARS-COV-2 Vaccination 10/14/2019, 11/04/2019, 07/18/2020, 07/01/2021   Pneumococcal Conjugate-13 03/09/2014   Tdap 03/21/2009, 05/29/2021    Family History  Problem Relation Age of Onset   Breast cancer Mother    Heart disease Father    Migraines Neg Hx      Current Outpatient Medications:    acetaminophen (TYLENOL) 500 MG tablet, Take 500 mg by mouth every 8 (eight) hours as needed for moderate pain., Disp: , Rfl:    Aloe-Sodium Chloride (AYR SALINE NASAL GEL NA), Place 1 application  into the nose daily as needed (rhinitis)., Disp: , Rfl:    ALPRAZolam (XANAX) 0.5 MG tablet, Take 0.25-0.5 mg by mouth 2 (two) times  daily as needed for anxiety., Disp: , Rfl:    apixaban (ELIQUIS) 2.5 MG TABS tablet, Take 1 tablet (2.5 mg total) by mouth 2 (two) times daily., Disp: 42 tablet, Rfl: 3   apixaban (ELIQUIS) 2.5 MG TABS tablet, Take 1 tablet (2.5 mg total) by mouth 2 (two) times daily., Disp: 28 tablet, Rfl: 0   Biotin w/ Vitamins C & E (HAIR SKIN & NAILS GUMMIES PO), Take 1 tablet by mouth daily., Disp: , Rfl:    Calcium Carb-Ergocalciferol (CHEWABLE CALCIUM/D PO), Take 1 each by mouth daily., Disp: , Rfl:    clidinium-chlordiazePOXIDE (LIBRAX) 5-2.5 MG capsule, Take 1 capsule by mouth daily as needed., Disp: 60 capsule, Rfl: 3  digoxin (LANOXIN) 0.125 MG tablet, TAKE 1/2 TABLET BY MOUTH DAILY, Disp: 45 tablet, Rfl: 3   diltiazem (CARDIZEM CD) 120 MG 24 hr capsule, TAKE ONE CAPSULE BY MOUTH DAILY, Disp: 90 capsule, Rfl: 1   fluticasone (FLONASE) 50 MCG/ACT nasal spray, Place 1 spray into both nostrils daily as needed for allergies or rhinitis., Disp: , Rfl:    metoprolol succinate (TOPROL-XL) 50 MG 24 hr tablet, Take 100 mg ( 2 tablets of 50 mg ) in the morning  and  75 mg ( 1 and 1/2 tablet  of 50 mg) in the evening, Disp: 270 tablet, Rfl: 3   mirtazapine (REMERON) 15 MG tablet, Take 15 mg by mouth at bedtime., Disp: , Rfl:    Phenylephrine-APAP-guaiFENesin (EQ SINUS CONGESTION & PAIN PO), Take 1 tablet by mouth daily as needed (for sinus pain). Contains acetaminophen 325 mg and Phenylephrine 5 mg, Disp: , Rfl:    polyethylene glycol powder (GLYCOLAX/MIRALAX) 17 GM/SCOOP powder, Take 17 g by mouth daily as needed for moderate constipation., Disp: , Rfl:    potassium chloride (KLOR-CON M) 10 MEQ tablet, TAKE ONE TABLET BY MOUTH DAILY, Disp: 30 tablet, Rfl: 2   Tiotropium Bromide Monohydrate (SPIRIVA RESPIMAT) 1.25 MCG/ACT AERS, Inhale 2 puffs into the lungs daily., Disp: 4 g, Rfl: 5   torsemide (DEMADEX) 20 MG tablet, Take 1.5 tablets (30 mg total) by mouth 2 (two) times daily. Take 1 Tablet in the Morning and 0.5  Tablets in the evening, Disp: 90 tablet, Rfl: 3   zolpidem (AMBIEN) 5 MG tablet, Take 2.5 mg by mouth at bedtime., Disp: , Rfl:       Objective:   Vitals:   03/02/23 1044  BP: 110/78  Pulse: 74  SpO2: 95%  Weight: 116 lb 12.8 oz (53 kg)  Height: 5' (1.524 m)    Estimated body mass index is 22.81 kg/m as calculated from the following:   Height as of this encounter: 5' (1.524 m).   Weight as of this encounter: 116 lb 12.8 oz (53 kg).  @WEIGHTCHANGE @  [employer]   03/02/23 1044  Weight: 116 lb 12.8 oz (53 kg)     Physical Exam   General: No distress. Looks stab O2 at rest: no Cane present: no Sitting in wheel chair: no Frail: YES Obese: no Neuro: Alert and Oriented x 3. GCS 15. Speech normal Psych: Pleasant Resp:  Barrel Chest - no.  Wheeze - no, Crackles - no. SCOLIOPSIS +, No overt respiratory distress CVS: Normal heart sounds. Murmurs - no Ext: Stigmata of Connective Tissue Disease - no HEENT: Normal upper airway. PEERL +. No post nasal drip        Assessment:       ICD-10-CM   1. DOE (dyspnea on exertion)  R06.09 DG Chest 2 View    B Nat Peptide    2. Pleural effusion  J90     3. Bronchiectasis without complication (HCC)  J47.9 CBC w/Diff    Basic Metabolic Panel (BMET)    4. Pulmonary embolism, other, unspecified chronicity, unspecified whether acute cor pulmonale present (HCC)  I26.99     On the symptom assessment score her dyspnea is worse.  Objectively she looks the same as she did when I saw her last.  The pleural effusion is new.  This is new since July 2023.  So it has been new for the last 1 year.  Most likely this is cor pulmonale related.  Because it is stable.  I think  diuresis is the best optimal management.  Should take a conservative line of treatment here.  If the effusion is worsening then we can do a thoracentesis.  I did explain to her about thoracentesis and what it implies.  She is currently agreeable to the current plan of  management.     Plan:     Patient Instructions  DOE (dyspnea on exertion)   - PE in Jan 2023, scoliosis, bronchiectasis, mitral valve leak and and grade 2 heart muscle stiffness, atrial flutter contributing to respiratory symptoms - Also pleural fluids R > Left since July 2023 -> May 2024 is contriubintg - your current issues causing shortness of breath is mistly fixed but we can see if we can improved o nthe pleural fuids   Plan - CXR 2 view 03/02/2023 -  - check cbc, bmet, and BNP 03/02/2023 - Address individual components below  #Pulmonary embolism diagnosed January 2023  - completed 1 year of total eliquis  with low dose Sept 2023-Jan 2024  Plan  -- From PE perspective you do need eliquis but you probably need it for A Fibritllation - talk to Dr Tresa Endo about need to continue eliquis v stop eliquis    #Bronchiectasis  -Spiriva sample months ago did not help and was too expensive - currently doing well  Plan  - Flutter valve 5 times daily - No need for spiriva  Right lower lobe lung nodule 7.81mm on CT 10/09/21 - new. Resolved nodule July 2023 Radon Exposure x 34 years - newly discovered   Plan  - CT only if clinical need   #history of sleep apnea with prior intolerance to cPAP in 2015-2017  Plan  = expectant followup   #Pedeal Edema R > Left  since summer 2023  - due to varicose veins and heart and lung issues. Improved Jan 2024 and some recurrence 03/02/2023   Plan  - management by PCP [name], MD and cardiology   Positive rheumatoid factor January 2023  - shared decisio  in July 2023 making we wil follow expectantly since joints are okay   Plan  - expectant followup   Followup - 4 weeks with nurse practitioner for review    ( Level 05 visit: Estb 40-54 min  in  visit type: on-site physical face to visit  in total care time and counseling or/and coordination of care by this undersigned MD - Dr Kalman Shan. This includes one or more  of the following on this same day 03/02/2023: pre-charting, chart review, note writing, documentation discussion of test results, diagnostic or treatment recommendations, prognosis, risks and benefits of management options, instructions, education, compliance or risk-factor reduction. It excludes time spent by the CMA or office staff in the care of the patient. Actual time 40 min)   SIGNATURE    Dr. Kalman Shan, M.D., F.C.C.P,  Pulmonary and Critical Care Medicine Staff Physician, [facility] Director - Interstitial Lung Disease  Program  Pulmonary Fibrosis [facility] [place], [place], [address]  Pager: [phone], If no answer or between  15:00h - 7:00h: call [phone] Telephone: [phone]  11:36 AM 03/02/2023

## 2023-03-02 NOTE — Patient Instructions (Addendum)
DOE (dyspnea on exertion)   - PE in Jan 2023, scoliosis, bronchiectasis, mitral valve leak and and grade 2 heart muscle stiffness, atrial flutter contributing to respiratory symptoms - Also pleural fluids R > Left since July 2023 -> May 2024 is contriubintg - your current issues causing shortness of breath is mistly fixed but we can see if we can improved o nthe pleural fuids   Plan - CXR 2 view 03/02/2023 -  - check cbc, bmet, and BNP 03/02/2023 - Address individual components below  #Pulmonary embolism diagnosed January 2023  - completed 1 year of total eliquis  with low dose Sept 2023-Jan 2024  Plan  -- From PE perspective you do need eliquis but you probably need it for A Fibritllation - talk to Dr Tresa Endo about need to continue eliquis v stop eliquis    #Bronchiectasis  -Spiriva sample months ago did not help and was too expensive - currently doing well  Plan  - Flutter valve 5 times daily - No need for spiriva  Right lower lobe lung nodule 7.17mm on CT 10/09/21 - new. Resolved nodule July 2023 Radon Exposure x 34 years - newly discovered   Plan  - CT only if clinical need   #history of sleep apnea with prior intolerance to cPAP in 2015-2017  Plan  = expectant followup   #Pedeal Edema R > Left  since summer 2023  - due to varicose veins and heart and lung issues. Improved Jan 2024 and some recurrence 03/02/2023   Plan  - management by PCP [name], MD and cardiology   Positive rheumatoid factor January 2023  - shared decisio  in July 2023 making we wil follow expectantly since joints are okay   Plan  - expectant followup   Followup - 4 weeks with nurse practitioner for review

## 2023-03-02 NOTE — Telephone Encounter (Signed)
Pt was seen in office today. And Dr Marchelle Gearing went over results for CT scan with pt. Nothing further needed.

## 2023-03-03 ENCOUNTER — Telehealth: Payer: Self-pay | Admitting: Cardiovascular Disease

## 2023-03-03 NOTE — Telephone Encounter (Signed)
Pt states that she would like for Dr. Tresa Endo to look at her lab and chest xray results that she had done yesterday. Pt seems to think that results are heart related and would like a callback regarding this matter. Please advise

## 2023-03-03 NOTE — Telephone Encounter (Signed)
Spoke to patient. She was concerned  about  labs and chest x-ray  from  pulmonary.  RN informed patient - she would need to obtain result from Dr Jane Canary office.  Dr Tresa Endo is not in the office this week.  RN  offered  an appointment 03/13/23 with practitioner on the same day Dr Tresa Endo is in the office.    Patient states she could not come at the time given 8:50 am.  She would need a later appt time. Offered a later appt 1:55. She decline . She states she can not come that day because  she has something to do -  [birth year],   She would like to be placed on a waiting list-  has been done

## 2023-03-04 NOTE — Telephone Encounter (Signed)
Patient would like the nurse or doctor to call her and explain her recent trest results from he visit on Monday.  Patient stated she does not understand the terminology.  Please advise and call to discuss at [phone]

## 2023-03-04 NOTE — Telephone Encounter (Signed)
Ok for PA to see sooner

## 2023-03-04 NOTE — Telephone Encounter (Signed)
I called and spoke with the pt and notified her of results and recommendations from Dr. Derrek Monaco.  She verbalized understanding.  I have placed order for ECHO- she asked for [facility] location.   Dr. Tresa Endo, is there any way that your PA could see this pt sooner that her scheduled appt with you 06/23/23?

## 2023-03-06 NOTE — Telephone Encounter (Signed)
Patient states she still would like to discuss things with Dr Tresa Endo.  She is just confused with all the test and appointments.  She has spoken to PCP but would like to see Dr Tresa Endo. If possible for June 21 on his DOD day to see her?

## 2023-03-06 NOTE — Telephone Encounter (Signed)
Follow Up:   Patient called said she needs to talk to Dr Tresa Endo.

## 2023-03-09 NOTE — Progress Notes (Addendum)
Cardiology Clinic Note   Date: 03/10/2023 ID: Berneta, Maddocks July 25, 1932, MRN [id number]  Primary Cardiologist:  Nicki Guadalajara, MD  Patient Profile    Janet Nguyen is a 89 y.o. female who presents to the clinic today for concerns about recent testing.  Past medical history significant for: PAF. Onset March 2014. SVT/PVCs/PACs. Chronic diastolic heart failure. Echo 12/03/2021: EF 60 to 65%.  Mild LVH.  Moderate BAE.  Circumferential pericardial effusion without cardiac tamponade.  Moderate MR.  Moderate late systolic prolapse of the midline scallop of the posterior leaflet of the mitral valve.  Moderate TR.  Aortic valve sclerosis without stenosis.  Normal pulmonary artery systolic pressure.  Dilated IVC, RA pressure 8 mmHg. Pulmonary hypertension. Migraines. Hypertension. OSA. Interstitial lung disease. PE. Pleural effusion. CT chest 02/20/2023: No significant change in the moderate partially loculated right and small dependent left pleural effusions with associated compressive atelectasis in both lower lobes.  Consider thoracentesis if the etiology for these effusions is unknown.  New small focus of airspace disease or atelectasis medially in the left upper lobe.  No suspicious pulmonary nodules.  Stable cardiomegaly and central enlargement of the pulmonary arteries consistent with underlying pulmonary arterial hypertension.  Small stable mediastinal lymph nodes, likely reactive. Chest x-ray 03/02/2023: Persisting right greater than left pleural effusion with associated atelectasis/consolidation.  Questionable interval worsening of CHF from prior CT.    History of Present Illness    Janet Nguyen is a longtime patient of cardiology.  She is followed by Dr. Tresa Endo for the above outlined history.  Patient was last seen in the office by Dr. Tresa Endo on 12/22/2022 for follow-up.  She was doing well at that time and no changes were made.  She inquired on stopping Eliquis but Dr.  Tresa Endo feels she needs to remain on this given PAF and remote history of PE.  Patient underwent CT chest 02/20/2023 which showed pleural effusions.  She was evaluated by Dr. Marchelle Gearing on 03/02/2023 who felt the best option for management of pleural effusion was diuresis.  Chest x-ray 03/02/2023 showed persistent pleural effusion and questionable worsening of CHF.  BNP on 03/02/2023 was 1382.  Today, patient is here alone.  She reports edema of lower extremities.  She recently noticed edema of her labia and abdominal fullness.  She is uncertain how long this has been going on.  She weighs herself most days of the week and has not noticed a significant jump in her weight.  She reports DOE.  No orthopnea or PND.  She eats a lot of homemade soup made by her daughter and knows that it is low in sodium.  However, patient also eats a lot of deli meat and cheese as she does not cook for herself anymore.  She is taking torsemide 30 mg in the a.m. and 10 mg in the p.m. with brisk diuresis.  She states she is not sleeping well but is uncertain why.  She is very concerned about recent evaluation she had with GI for fecal incontinence.  It was suggested she undergo anorectal manometry and she would like my opinion if she should go through with the test.  Her biggest concern is it needs to be done in [place] and she has never driven in [place] and does not have anyone to drive her.  She mentions Eliquis is making her very itchy.  She has tried allergy medicine in the past but does not want to continue taking it.  Has an echo scheduled  for 04/07/2023.  Offered sooner echo for 03/16/2023 but patient was unable to make that.  Rescheduled for 03/27/2023.    ROS: All other systems reviewed and are otherwise negative except as noted in History of Present Illness.  Studies Reviewed    ECG is not ordered today.  Risk Assessment/Calculations     CHA2DS2-VASc Score = 4   This indicates a 4.8% annual risk of stroke. The  patient's score is based upon: CHF History: 1 HTN History: 0 Diabetes History: 0 Stroke History: 0 Vascular Disease History: 0 Age Score: 2 Gender Score: 1             Physical Exam    VS:  BP 124/70 (BP Location: Left Arm, Patient Position: Sitting, Cuff Size: Normal)   Pulse (!) 44   Ht 5' (1.524 m)   Wt 117 lb 9.6 oz (53.3 kg)   SpO2 92%   BMI 22.97 kg/m  , BMI Body mass index is 22.97 kg/m.  GEN: Well nourished, well developed, in no acute distress. Neck: No JVD or carotid bruits. Cardiac:  RRR. No murmurs. No rubs or gallops.   Respiratory:  Respirations regular and unlabored. Clear to auscultation without rales, wheezing or rhonchi. GI: Soft, nontender, nondistended. Extremities: Radials/DP/PT 2+ and equal bilaterally. No clubbing or cyanosis.  1+ edema bilateral lower extremities R>L with compression hose in place. Skin: Warm and dry, no rash. Neuro: Strength intact.  Assessment & Plan    PAF.  Onset March 2014.  Patient denies palpitations.  No spontaneous bleeding concerns.  She does report feeling itchy on Eliquis dose.  She has used allergy medicine in the past but does not want to keep taking it.  RRR on exam today.  Continue diltiazem, metoprolol, Eliquis. Appropriate Eliquis dose. Addendum: Discussed Eliquis with Pharm.D.  It is possible she will have same reaction to Xarelto but likely worth a try.  If she agrees she will start Xarelto 15 mg 12 hours after her last dose of Eliquis.  I will discuss this with her at her office visit next week. Chronic diastolic heart failure/lower extremity edema/genital edema/abdominal bloating.  Echo March 2023 showed normal LV function, mild LVH, moderate BAE, circumferential pericardial effusion without cardiac tamponade.  Recent chest x-ray 03/02/2023 question interval worsening of CHF from prior CTA.  BNP 1382.  Patient reports increased lower extremity edema, labial swelling, abdominal fullness/bloating.  Patient weighs most  days and has not noticed a jump in her weight.  Dyspnea with light exertion.  1+ edema bilateral lower extremities R>L.  Abdomen soft.  Lungs clear to auscultation bilaterally with no wheezing, rales, rhonchi.  Increased torsemide to 40 mg in the a.m. and 20 mg in the p.m. x 5 days then 20 mg twice daily thereafter.  Patient is instructed to weigh daily and is provided with a weight log.  BMP and BNP in 1 week.  Continue metoprolol.  Repeat echo was rescheduled for 03/27/2023.  Disposition: Increase torsemide 40 mg in the a.m. 20 mg in the p.m. x 5 days then 20 mg twice daily thereafter.  Weight log provided for daily weights.  BMP and BNP in 1 week.  Return for office visit 03/19/2021 or sooner as needed.         Signed, Etta Grandchild. Lilit Cinelli, DNP, NP-C

## 2023-03-09 NOTE — Telephone Encounter (Signed)
No openings for Dr.Kelly at this time, we can continue to monitor the schedule for any cancellations.   Appointment scheduled with NP on 06/11.

## 2023-03-10 ENCOUNTER — Encounter: Payer: Self-pay | Admitting: Student

## 2023-03-10 ENCOUNTER — Ambulatory Visit: Payer: Medicare Other | Attending: Cardiovascular Disease | Admitting: Student

## 2023-03-10 VITALS — BP 124/70 | HR 44 | Ht 60.0 in | Wt 117.6 lb

## 2023-03-10 DIAGNOSIS — I5032 Chronic diastolic (congestive) heart failure: Secondary | ICD-10-CM | POA: Diagnosis not present

## 2023-03-10 DIAGNOSIS — R140 Abdominal distension (gaseous): Secondary | ICD-10-CM | POA: Insufficient documentation

## 2023-03-10 DIAGNOSIS — Z79899 Other long term (current) drug therapy: Secondary | ICD-10-CM | POA: Insufficient documentation

## 2023-03-10 DIAGNOSIS — R600 Localized edema: Secondary | ICD-10-CM | POA: Diagnosis not present

## 2023-03-10 DIAGNOSIS — I480 Paroxysmal atrial fibrillation: Secondary | ICD-10-CM | POA: Diagnosis not present

## 2023-03-10 DIAGNOSIS — Z86711 Personal history of pulmonary embolism: Secondary | ICD-10-CM | POA: Diagnosis not present

## 2023-03-10 MED ORDER — TORSEMIDE 20 MG PO TABS: 40.0000 mg | ORAL_TABLET | Freq: Every day | ORAL | 3 refills | Status: DC

## 2023-03-10 NOTE — Patient Instructions (Signed)
Medication Instructions:  Your physician has recommended you make the following change in your medication:  INCREASE: Torsemide to 60mg  for the next 5 days. (2 tablets in the morning 1 tablet in the evening.) on day 6 please decrease your Torsemide to 40mg  daily. (1 tablet in the morning and 1 tablet in the evening.)  *If you need a refill on your cardiac medications before your next appointment, please call your pharmacy*  Please weigh yourself daily.    Lab Work: Your physician recommends that you return MONDAY or TUESDAY to have the following labs drawn: BMET and BNP  If you have labs (blood work) drawn today and your tests are completely normal, you will receive your results only by: MyChart Message (if you have MyChart) OR A paper copy in the mail If you have any lab test that is abnormal or we need to change your treatment, we will call you to review the results.   Testing/Procedures: NONE   Follow-Up: At [facility], you and your health needs are our priority.  As part of our continuing mission to provide you with exceptional heart care, we have created designated Provider Care Teams.  These Care Teams include your primary Cardiologist (physician) and Advanced Practice Providers (APPs -  Physician Assistants and Nurse Practitioners) who all work together to provide you with the care you need, when you need it.  We recommend signing up for the patient portal called "MyChart".  Sign up information is provided on this After Visit Summary.  MyChart is used to connect with patients for Virtual Visits (Telemedicine).  Patients are able to view lab/test results, encounter notes, upcoming appointments, etc.  Non-urgent messages can be sent to your provider as well.   To learn more about what you can do with MyChart, go to [email or web address].    Your next appointment:   Keep Upcoming appointment with Carlos Levering, NP   Other Instructions

## 2023-03-11 IMAGING — CR DG CHEST 2V
2 series · 2 of 2 positions shown · non-contrast
Comparison: Chest x-ray 10/08/2021

CLINICAL DATA: Elevated D-dimer

EXAM:
CHEST - 2 VIEW

[w chest pa]
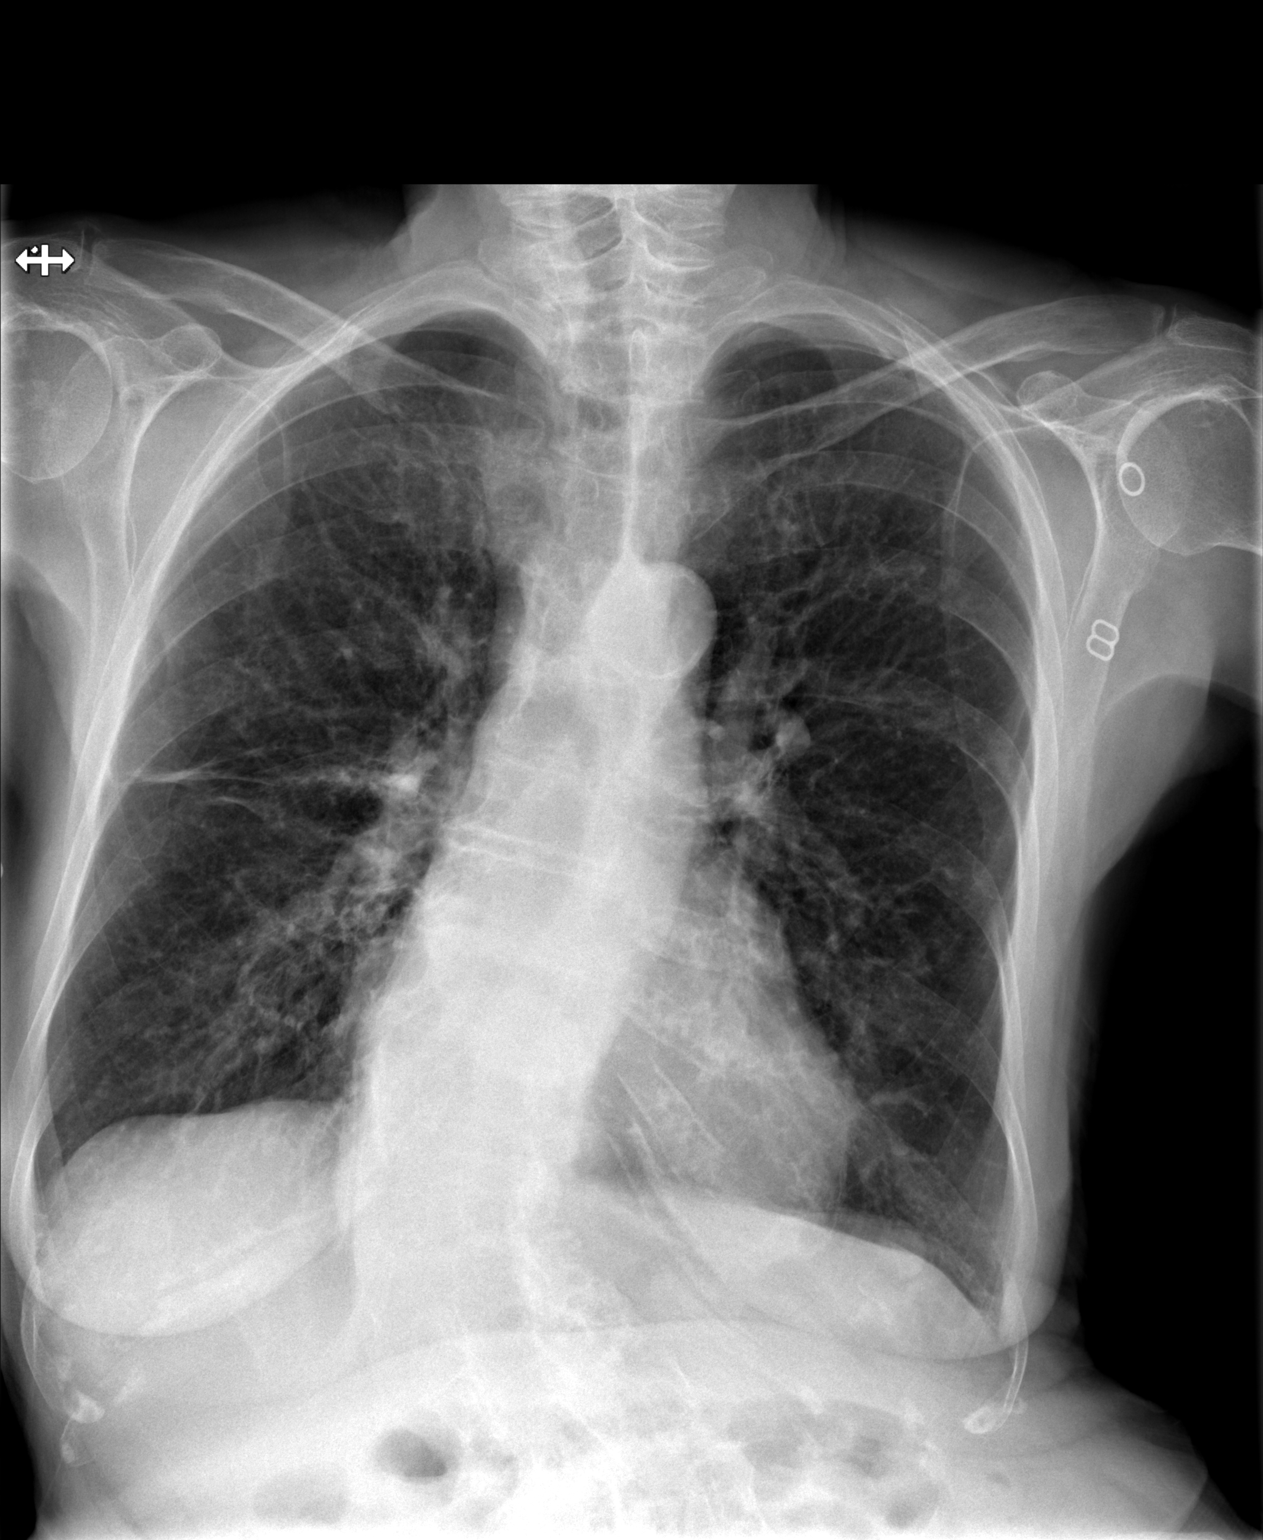

[w chest lat]
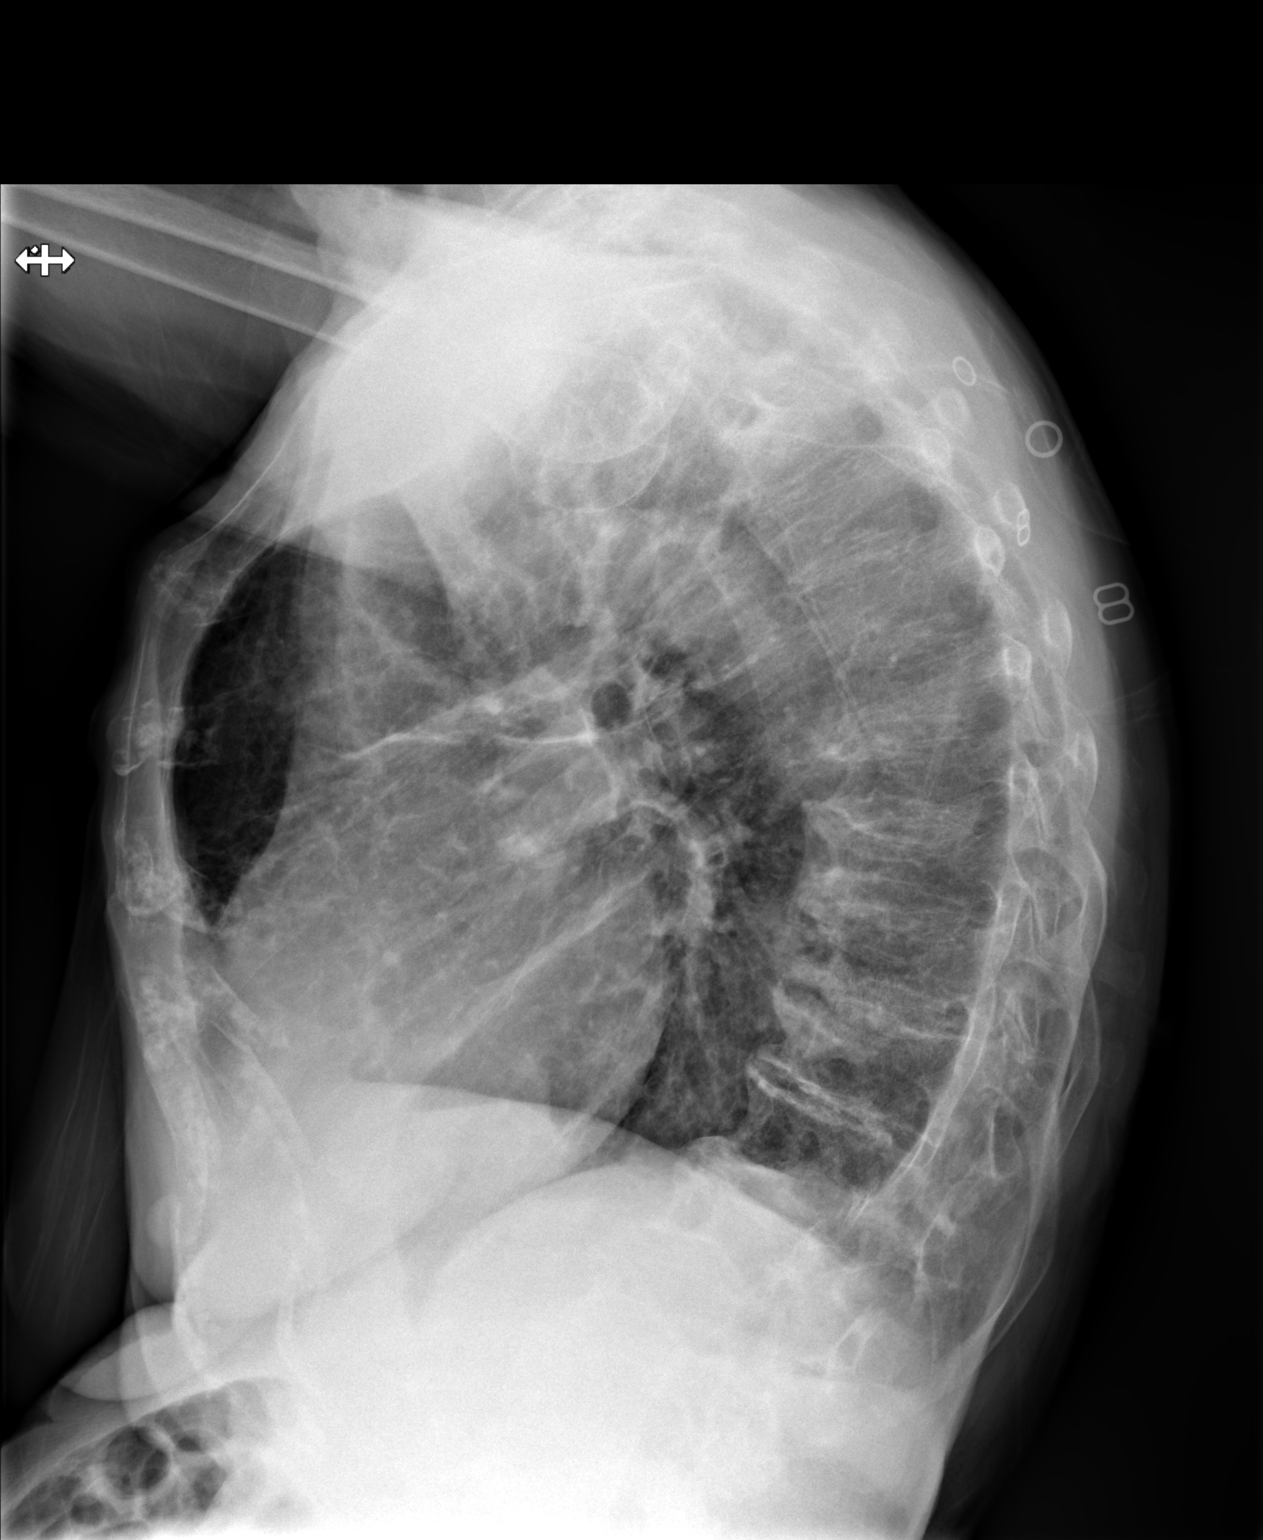

[2 of 2 positions shown; findings below may reference images not displayed]

FINDINGS: Heart is mildly enlarged. Mediastinum appears stable. Calcified
plaques in the aortic arch and tortuosity of the thoracic aorta.
Lungs are hyperinflated with prominent interstitial lung markings.
Linear band of platelike atelectasis or scarring in the right mid
lung zone. No pleural effusion or pneumothorax.
IMPRESSION: 1. COPD
2. Mild cardiomegaly.

## 2023-03-11 NOTE — Telephone Encounter (Signed)
Patient seen in office by NP- next scheduled appointment on 06/21. Thanks!

## 2023-03-16 ENCOUNTER — Other Ambulatory Visit (HOSPITAL_COMMUNITY): Payer: Medicare Other

## 2023-03-16 ENCOUNTER — Other Ambulatory Visit: Payer: Self-pay

## 2023-03-16 DIAGNOSIS — Z79899 Other long term (current) drug therapy: Secondary | ICD-10-CM

## 2023-03-16 DIAGNOSIS — R159 Full incontinence of feces: Secondary | ICD-10-CM | POA: Diagnosis not present

## 2023-03-16 DIAGNOSIS — I5032 Chronic diastolic (congestive) heart failure: Secondary | ICD-10-CM | POA: Diagnosis not present

## 2023-03-16 DIAGNOSIS — Z86711 Personal history of pulmonary embolism: Secondary | ICD-10-CM

## 2023-03-16 DIAGNOSIS — Z6821 Body mass index (BMI) 21.0-21.9, adult: Secondary | ICD-10-CM | POA: Diagnosis not present

## 2023-03-17 ENCOUNTER — Other Ambulatory Visit: Payer: Self-pay

## 2023-03-17 DIAGNOSIS — I159 Secondary hypertension, unspecified: Secondary | ICD-10-CM

## 2023-03-17 LAB — BASIC METABOLIC PANEL
BUN/Creatinine Ratio: 16 (ref 12–28)
BUN: 18 mg/dL (ref 8–27)
CO2: 25 mmol/L (ref 20–29)
Calcium: 9.4 mg/dL (ref 8.7–10.3)
Chloride: 93 mmol/L — ABNORMAL LOW (ref 96–106)
Creatinine, Ser: 1.12 mg/dL — ABNORMAL HIGH (ref 0.57–1.00)
Glucose: 93 mg/dL (ref 70–99)
Potassium: 3.8 mmol/L (ref 3.5–5.2)
Sodium: 138 mmol/L (ref 134–144)
eGFR: 47 mL/min/{1.73_m2} — ABNORMAL LOW (ref >59)

## 2023-03-17 LAB — BRAIN NATRIURETIC PEPTIDE: BNP: 973.6 pg/mL — ABNORMAL HIGH (ref 0.0–100.0)

## 2023-03-19 ENCOUNTER — Other Ambulatory Visit: Payer: Self-pay | Admitting: Cardiovascular Disease

## 2023-03-19 NOTE — Progress Notes (Signed)
Cardiology Clinic Note   Date: 03/20/2023 ID: Janet Nguyen, Janet Nguyen 1932/03/03, MRN [id number]  Primary Cardiologist:  Nicki Guadalajara, MD  Patient Profile    Janet Nguyen is a 89 y.o. female who presents to the clinic today for follow-up of lower extremity edema.    Past medical history significant for: PAF. Onset March 2014. SVT/PVCs/PACs. Chronic diastolic heart failure. Echo 12/03/2021: EF 60 to 65%.  Mild LVH.  Moderate BAE.  Circumferential pericardial effusion without cardiac tamponade.  Moderate MR.  Moderate late systolic prolapse of the midline scallop of the posterior leaflet of the mitral valve.  Moderate TR.  Aortic valve sclerosis without stenosis.  Normal pulmonary artery systolic pressure.  Dilated IVC, RA pressure 8 mmHg. Pulmonary hypertension. Migraines. Hypertension. OSA. Interstitial lung disease. PE. Pleural effusion. CT chest 02/20/2023: No significant change in the moderate partially loculated right and small dependent left pleural effusions with associated compressive atelectasis in both lower lobes.  Consider thoracentesis if the etiology for these effusions is unknown.  New small focus of airspace disease or atelectasis medially in the left upper lobe.  No suspicious pulmonary nodules.  Stable cardiomegaly and central enlargement of the pulmonary arteries consistent with underlying pulmonary arterial hypertension.  Small stable mediastinal lymph nodes, likely reactive. Chest x-ray 03/02/2023: Persisting right greater than left pleural effusion with associated atelectasis/consolidation.  Questionable interval worsening of CHF from prior CT.     History of Present Illness    Janet Nguyen is a longtime patient of cardiology.  She is followed by Dr. Tresa Endo for the above outlined history.   Patient was last seen in the office by Dr. Tresa Endo on 12/22/2022 for follow-up.  She was doing well at that time and no changes were made.  She inquired on stopping Eliquis  but Dr. Tresa Endo feels she needs to remain on this given PAF and remote history of PE.   Patient underwent CT chest 02/20/2023 which showed pleural effusions.  She was evaluated by Dr. Marchelle Gearing on 03/02/2023 who felt the best option for management of pleural effusion was diuresis.  Chest x-ray 03/02/2023 showed persistent pleural effusion and questionable worsening of CHF.  BNP on 03/02/2023 was 1382.  Patient was last seen in the office by me on 03/10/2023 reports of lower extremity edema extending up the legs to abdomen.  Patient reported her labia was edematous as well.  She reported weight gain and DOE.  No orthopnea or PND. She reported dietary indiscretion with eating a lot of deli meat and cheese, as she does not cook for herself.  Torsemide was increased to 40 mg in the a.m. and 20 mg in the p.m. x 5 days then 20 mg bid  thereafter.  Provided with a weight log to bring at next visit.  BNP on 03/16/2023 937.6 (improved from 1382 on 03/02/2023).  BMP showed kidney function at baseline and normal electrolytes  Today, patient is here alone.  She reports significantly improved lower extremity edema.  She is down 8 pounds and weight has been stable for the last 3 days.  Home weight is 107.3.  She states her normal weight is 105.  We had originally scheduled her repeat echo for the end of June but she did not want to go to [facility] as it is difficult for her to get to that office.  She rescheduled her echo to [facility] for 04/07/2023.  She has a lot of questions about her multiple diagnoses.  Discussed diagnosis of heart failure  and how it relates to her lower extremity edema and A-fib. No chest pain, pressure, or tightness. Denies orthopnea or PND. No palpitations.  Patient expresses frustration over her shortness of breath and lack of understanding of what is wrong with her lungs.  She has a lot of questions about her inhalers and "how to use them."  Encouraged her to contact her pulmonologist to answer her  questions and provided education about her inhalers.  She continues to feel that Eliquis is causing a rash on her upper arms and back.  She states she is particularly itchy at night.  Discussed changing her to Xarelto to see if she tolerates that better.  Patient states Xarelto is too expensive and she would prefer to stay on Eliquis.  Encouraged her to start taking over-the-counter allergy medicine to see if this helps her symptoms.     ROS: All other systems reviewed and are otherwise negative except as noted in History of Present Illness.  Studies Reviewed    EKG Interpretation  Date/Time:  Friday March 20 2023 14:04:38 EDT Ventricular Rate:  71 PR Interval:    QRS Duration: 86 QT Interval:  370 QTC Calculation: 402 R Axis:   -76 Text Interpretation: Atrial fibrillation Left anterior fascicular block Septal infarct (cited on or before 04-Feb-2022) When compared with ECG of 04-Feb-2022 10:59, Atrial fibrillation has replaced Atrial flutter Vent. rate has decreased BY  54 BPM Left anterior fascicular block is now Present Confirmed by Carlos Levering [phone]) on 03/20/2023 2:12:19 PM    Risk Assessment/Calculations     CHA2DS2-VASc Score = 4   This indicates a 4.8% annual risk of stroke. The patient's score is based upon: CHF History: 1 HTN History: 0 Diabetes History: 0 Stroke History: 0 Vascular Disease History: 0 Age Score: 2 Gender Score: 1             Physical Exam    VS:  BP 126/84   Pulse 71   Ht 5\' 5"  (1.651 m)   Wt 112 lb 3.2 oz (50.9 kg)   SpO2 94%   BMI 18.67 kg/m  , BMI Body mass index is 18.67 kg/m.  GEN: Well nourished, well developed, in no acute distress. Neck: No JVD or carotid bruits. Cardiac: Irregular rhythm, controlled rate. No murmurs. No rubs or gallops.   Respiratory:  Respirations regular and unlabored. Clear to auscultation without rales, wheezing or rhonchi. GI: Soft, nontender, nondistended. Extremities: Radials/DP/PT 2+ and equal  bilaterally. No clubbing or cyanosis.  1+ pitting edema bilateral feet and ankles, mild nonpitting edema pretibial areas bilaterally. Skin: Warm and dry, no rash. Neuro: Strength intact.  Assessment & Plan    PAF.  Onset March 2014.  Patient denies palpitations.  No spontaneous bleeding concerns.  EKG shows A-fib, 71 bpm.  Continue metoprolol, diltiazem, digoxin, Eliquis. Appropriate Eliquis dose.   Chronic diastolic heart failure/lower extremity edema/abdominal bloating.  Echo March 2023 showed normal LV function, mild LVH, moderate BAE, circumferential pericardial effusion without cardiac tamponade.  Chest x-ray 03/02/2023 question interval worsening of CHF from prior CTA.  BNP 932 down from 1382.  Patient reports significantly improved lower extremity edema and decreased abdominal bloating.  She has been working on eating more fresh foods and "less wine and cheese and crackers."  Her weight is down 8 pounds and has been stable for the last 3 days.  Home weight was 107.3 which is 2 pounds from her normal weight of 105.  1+ pitting edema bilateral feet and ankles,  mild nonpitting edema pretibial areas bilaterally.  Will have patient continue taking torsemide 20 mg twice daily through 03/29/2023.  She will have repeat BMP and BNP on 03/30/2023.  If BNP continues to improve will have patient resume former dose of torsemide of 20 mg in the a.m. and 10 mg in the p.m.  Repeat echo is scheduled for 04/07/2023.  She will follow-up with me on 04/20/2023.  Disposition: Continue torsemide 20 mg twice daily through 03/29/2023.  BMP and BNP 03/30/2023.  Return visit with me 04/20/2023.  Provided instructions for patient to take extra half of torsemide with weight gain of 3 pounds overnight and 5 pounds in a week.         Signed, Etta Grandchild. Thanh Pomerleau, DNP, NP-C

## 2023-03-20 ENCOUNTER — Ambulatory Visit: Payer: Medicare Other | Attending: Student | Admitting: Student

## 2023-03-20 ENCOUNTER — Encounter: Payer: Self-pay | Admitting: Student

## 2023-03-20 VITALS — BP 126/84 | HR 71 | Ht 65.0 in | Wt 112.2 lb

## 2023-03-20 DIAGNOSIS — I484 Atypical atrial flutter: Secondary | ICD-10-CM | POA: Diagnosis not present

## 2023-03-20 DIAGNOSIS — I480 Paroxysmal atrial fibrillation: Secondary | ICD-10-CM

## 2023-03-20 DIAGNOSIS — R600 Localized edema: Secondary | ICD-10-CM | POA: Diagnosis not present

## 2023-03-20 DIAGNOSIS — Z79899 Other long term (current) drug therapy: Secondary | ICD-10-CM | POA: Diagnosis not present

## 2023-03-20 DIAGNOSIS — I5032 Chronic diastolic (congestive) heart failure: Secondary | ICD-10-CM

## 2023-03-20 NOTE — Patient Instructions (Signed)
Medication Instructions:  Continue to take Torsemide 20mg  in the morning and Torsemide 20mg  in th evening  *If you need a refill on your cardiac medications before your next appointment, please call your pharmacy*   Lab Work: Return for labs on March 30, 2023 NO APPOINTMENT NEEDED.  If you have labs (blood work) drawn today and your tests are completely normal, you will receive your results only by: MyChart Message (if you have MyChart) OR A paper copy in the mail If you have any lab test that is abnormal or we need to change your treatment, we will call you to review the results.   Testing/Procedures: None ordered   Follow-Up: At [facility], you and your health needs are our priority.  As part of our continuing mission to provide you with exceptional heart care, we have created designated Provider Care Teams.  These Care Teams include your primary Cardiologist (physician) and Advanced Practice Providers (APPs -  Physician Assistants and Nurse Practitioners) who all work together to provide you with the care you need, when you need it.  We recommend signing up for the patient portal called "MyChart".  Sign up information is provided on this After Visit Summary.  MyChart is used to connect with patients for Virtual Visits (Telemedicine).  Patients are able to view lab/test results, encounter notes, upcoming appointments, etc.  Non-urgent messages can be sent to your provider as well.   To learn more about what you can do with MyChart, go to [email or web address].    Your next appointment:   1 month(s)  Provider:   Carlos Levering, NP        Other Instructions Take your weight daily. If you have gained 3lbs in one night or 5lbs in one week, take 1/2 tablet of the Torsemide.

## 2023-03-23 ENCOUNTER — Telehealth: Payer: Self-pay | Admitting: *Deleted

## 2023-03-23 NOTE — Progress Notes (Signed)
  Care Coordination  Outreach Note  03/23/2023 Name: Janet Nguyen MRN: [id number] DOB: 03/22/1932   Care Coordination Outreach Attempts: An unsuccessful telephone outreach was attempted today to offer the patient information about available care coordination services.  Follow Up Plan:  Additional outreach attempts will be made to offer the patient care coordination information and services.   Encounter Outcome:  No Answer  Christie Nottingham  Care Coordination Care Guide  Direct Dial: [phone]

## 2023-03-24 NOTE — Progress Notes (Signed)
  Care Coordination  Outreach Note  03/24/2023 Name: MARIANA WIEDERHOLT MRN: [id number] DOB: 1932-08-13   Care Coordination Outreach Attempts: A second unsuccessful outreach was attempted today to offer the patient with information about available care coordination services.  Follow Up Plan:  Additional outreach attempts will be made to offer the patient care coordination information and services.   Encounter Outcome:  Pt. Request to Call Back   [facility] Coordination Care Guide  Direct Dial: [phone]

## 2023-03-27 ENCOUNTER — Other Ambulatory Visit (HOSPITAL_COMMUNITY): Payer: Medicare Other

## 2023-03-30 DIAGNOSIS — I484 Atypical atrial flutter: Secondary | ICD-10-CM | POA: Diagnosis not present

## 2023-03-30 DIAGNOSIS — I471 Supraventricular tachycardia, unspecified: Secondary | ICD-10-CM | POA: Diagnosis not present

## 2023-03-30 DIAGNOSIS — I5032 Chronic diastolic (congestive) heart failure: Secondary | ICD-10-CM | POA: Diagnosis not present

## 2023-03-30 DIAGNOSIS — Z79899 Other long term (current) drug therapy: Secondary | ICD-10-CM | POA: Diagnosis not present

## 2023-03-30 NOTE — Progress Notes (Signed)
  Care Coordination   Note   03/30/2023 Name: [name] MRN: [id number] DOB: 1932/02/06  Janet Nguyen is a 89 y.o. year old female who sees [name], MD for primary care. I reached out to [name] by phone today to offer care coordination services.  [name] was given information about Care Coordination services today including:   The Care Coordination services include support from the care team which includes your Nurse Coordinator, Clinical Social Worker, or Pharmacist.  The Care Coordination team is here to help remove barriers to the health concerns and goals most important to you. Care Coordination services are voluntary, and the patient may decline or stop services at any time by request to their care team member.   Care Coordination Consent Status: Patient agreed to services and verbal consent obtained.   Follow up plan:  Telephone appointment with care coordination team member scheduled for:  04/01/2023  Encounter Outcome:  Pt. Scheduled from referral

## 2023-03-31 ENCOUNTER — Encounter: Payer: Self-pay | Admitting: Physical Therapy

## 2023-03-31 ENCOUNTER — Other Ambulatory Visit: Payer: Self-pay

## 2023-03-31 ENCOUNTER — Ambulatory Visit: Payer: Medicare Other | Attending: Family Medicine | Admitting: Physical Therapy

## 2023-03-31 DIAGNOSIS — M6281 Muscle weakness (generalized): Secondary | ICD-10-CM | POA: Diagnosis not present

## 2023-03-31 DIAGNOSIS — R293 Abnormal posture: Secondary | ICD-10-CM | POA: Diagnosis not present

## 2023-03-31 DIAGNOSIS — G8929 Other chronic pain: Secondary | ICD-10-CM | POA: Diagnosis not present

## 2023-03-31 DIAGNOSIS — M62830 Muscle spasm of back: Secondary | ICD-10-CM | POA: Diagnosis not present

## 2023-03-31 DIAGNOSIS — M545 Low back pain, unspecified: Secondary | ICD-10-CM | POA: Insufficient documentation

## 2023-03-31 DIAGNOSIS — M4125 Other idiopathic scoliosis, thoracolumbar region: Secondary | ICD-10-CM | POA: Diagnosis not present

## 2023-03-31 DIAGNOSIS — M542 Cervicalgia: Secondary | ICD-10-CM | POA: Insufficient documentation

## 2023-03-31 DIAGNOSIS — M546 Pain in thoracic spine: Secondary | ICD-10-CM | POA: Diagnosis not present

## 2023-03-31 LAB — BASIC METABOLIC PANEL
BUN/Creatinine Ratio: 17 (ref 12–28)
BUN: 21 mg/dL (ref 8–27)
CO2: 29 mmol/L (ref 20–29)
Calcium: 9.5 mg/dL (ref 8.7–10.3)
Chloride: 97 mmol/L (ref 96–106)
Creatinine, Ser: 1.25 mg/dL — ABNORMAL HIGH (ref 0.57–1.00)
Glucose: 92 mg/dL (ref 70–99)
Potassium: 4.7 mmol/L (ref 3.5–5.2)
Sodium: 138 mmol/L (ref 134–144)
eGFR: 41 mL/min/{1.73_m2} — ABNORMAL LOW (ref >59)

## 2023-03-31 LAB — BRAIN NATRIURETIC PEPTIDE: BNP: 1060.8 pg/mL — ABNORMAL HIGH (ref 0.0–100.0)

## 2023-03-31 NOTE — Patient Instructions (Signed)
     [facility] Welcome to [facility]! Here you will find all the information you will need regarding your pool therapy. If you have further questions at any time, please call our office at [phone]. After completing your initial evaluation in the [facility], you may be eligible to complete a portion of your therapy in the pool. A typical week of therapy will consist of 1-2 typical physical therapy visits at our [facility] location and an additional session of therapy in the pool located at the [facility] at [address]. [address], [place] [address]. The phone number at the pool site is [phone]. Please call this number if you are running late or need to cancel your appointment.  Aquatic therapy will be offered on Wednesday mornings and Friday afternoons. Each session will last approximately 45 minutes. All scheduling and payments for aquatic therapy sessions, including cancelations, will be done through our [facility] location.  To be eligible for aquatic therapy, these criteria must be met: You must be able to independently change in the locker room and get to the pool deck. A caregiver can come with you to help if needed. There are benches for a caregiver to sit on next to the pool. No one with an open wound is permitted in the pool.  Handicap parking is available in the front and there is a drop off option for even closer accessibility. Please arrive 15 minutes prior to your appointment to prepare for your pool session. You must sign in at the front desk upon your arrival. Please be sure to attend to any toileting needs prior to entering the pool. Locker rooms for changing are available.  There is direct access to the pool deck from the locker room. You can lock your belongings in a locker or bring them with you poolside. Your therapist will greet you on the pool deck. There may be other swimmers in the pool at the  same time but your session is one-on-one with the therapist.   

## 2023-03-31 NOTE — Therapy (Signed)
OUTPATIENT PHYSICAL THERAPY THORACOLUMBAR EVALUATION   Patient Name: Janet Nguyen MRN: [id number] DOB:1932/01/13, 89 y.o., female Today's Date: 03/31/2023  END OF SESSION:  PT End of Session - 03/31/23 1102     Visit Number 1    Date for PT Re-Evaluation 05/26/23    Authorization Type medicare - needs KX at visit 15    PT Start Time 1103    PT Stop Time 1144    PT Time Calculation (min) 41 min    Activity Tolerance Patient tolerated treatment well    Behavior During Therapy [facility] for tasks assessed/performed             Past Medical History:  Diagnosis Date   Arrhythmia    History of SVT with documented PVC'S and  PAC'S  12/08/12 Nuc stress test normal LV EF 74%  Event Monitor  12/01/12-01/03/13   Atrial flutter (HCC)    Celiac disease    treated by Dr. Kinnie Scales   GERD (gastroesophageal reflux disease)    Hypertension    Intervertebral disc stenosis of neural canal of cervical region    Irregular heart beat 11/30/2012   ECHO-EF 60-65%   Osteoporosis    PMR (polymyalgia rheumatica) (HCC)    Dr. Mallie Mussel; pt states she was diagnosed 10-15 years ago, not treated at this time or any issues that she is aware of.   Scoliosis    Scoliosis    Sleep apnea 10/02/11 [facility]   Sleep study AHI -total sleep 10.3/hr  64.0/ hr during REM sleep.RDI 22.8/hr during total sleep 64.0/hr during REM sleep The lowest O2 sat during Non-REM and REM sleep was 86% and 88% respectively. 04/08/12 CPAP/BIPAP titration study [facility]   Past Surgical History:  Procedure Laterality Date   APPENDECTOMY     ruptured at age 44 and had surgery   CARDIAC CATHETERIZATION  01/27/06   CARDIOVERSION N/A 01/08/2022   Procedure: CARDIOVERSION;  Surgeon: Meriam Sprague, MD;  Location: [facility] ENDOSCOPY;  Service: Cardiovascular;  Laterality: N/A;   cataract surgery  2015   Dr. Elmer Picker; March & April 2015   Patient Active Problem List   Diagnosis Date Noted   Secondary  hypercoagulable state (HCC) 12/12/2021   Hyponatremia 12/04/2021   Acute CHF (congestive heart failure) (HCC) 12/02/2021   Acute on chronic diastolic CHF (congestive heart failure) (HCC) 12/02/2021   Atrial fibrillation with rapid ventricular response (HCC) 12/02/2021   Essential hypertension 12/02/2021   GERD without esophagitis 12/02/2021   Obstructive sleep apnea 12/02/2021   Interstitial lung disease (HCC) 12/02/2021   History of pulmonary embolism 12/02/2021   Pulmonary embolism (HCC) 11/06/2021   Bronchiectasis (HCC) 11/06/2021   Deviated septum 05/08/2020   Mass of subcutaneous tissue 05/02/2020   Vertigo 06/16/2019   Pulmonary hypertension, unspecified (HCC) 04/14/2019   Ischemic colitis (HCC) 04/14/2019   Migraine with aura and without status migrainosus, not intractable 11/08/2018   Degeneration of lumbar intervertebral disc 10/14/2018   Hoarseness of voice 03/04/2018   Abdominal pain 07/01/2017   Diverticulitis, colon    Metabolic acidosis, increased anion gap    Constipation 04/14/2017   Lumbar hernia 04/14/2017   Presbycusis of both ears 01/10/2017   Tinnitus aurium, bilateral 01/10/2017   Gastroesophageal reflux disease 08/20/2016   Hemoptysis 08/20/2016   Obstructive sleep apnea of adult 08/20/2016   Rhinitis, chronic 08/20/2016   Throat pain in adult 08/20/2016   Fatigue 12/23/2015   Sciatica of right side 10/06/2015   History  of migraine headaches 10/06/2015   Frequent PVCs 12/28/2013   Premature atrial contractions 12/28/2013   PSVT (paroxysmal supraventricular tachycardia) (HCC) 12/28/2013   Heart palpitations 07/13/2013   Sleep apnea 04/11/2013   Scoliosis 04/11/2013   Atypical atrial flutter (HCC) 11/29/2012   Chest pain, atypical 11/29/2012   Fibromyalgia syndrome 11/29/2012   Chronic steroid use 11/29/2012    PCP: [name], MD  REFERRING PROVIDER: [name], MD  REFERRING DIAG: M41.9 (ICD-10-CM) - Scoliosis,  unspecified  Rationale for Evaluation and Treatment: Rehabilitation  THERAPY DIAG:  Other idiopathic scoliosis, thoracolumbar region  Muscle weakness (generalized)  Pain in thoracic spine  Cervicalgia  Abnormal posture  ONSET DATE: chronic  SUBJECTIVE:                                                                                                                                                                                           SUBJECTIVE STATEMENT: I have taken 10 steps back since last here.  I haven't been able to stay active b/c of physical and mental health.  My left side is so tight and painful again where my hip is up into my ribcage.  I am also having some weakness in my pelvic floor and having trouble controlling my bowels.  I have an appointment to have that evaluated here later in the month.  I can hardly walk through my own house without getting SOB.  I am SOB just talking to you.  PERTINENT HISTORY:  Severe scoliosis Pulmonary HTN Cardioversion 12/2021 osteoporosis Depression Lives alone, has been treated at this facility many times    PAIN:  PAIN:  Are you having pain? Yes NPRS scale: 5/10 Pain location: left flank Pain orientation: Left  PAIN TYPE: aching, dull, and tight Pain description: constant  Aggravating factors: overeating (no room for my stomach), walking brings me SOB Relieving factors: Tylenol but always constant pain   PRECAUTIONS: Other: low bone density  WEIGHT BEARING RESTRICTIONS: No  FALLS:  Has patient fallen in last 6 months? No  LIVING ENVIRONMENT: Lives with: lives alone Lives in: House/apartment Stairs: No Has following equipment at home: None  OCCUPATION: retired  PLOF: Independent  PATIENT GOALS: reduce pain, walk with less SOB, improve flexibility  NEXT MD VISIT: as needed  OBJECTIVE:   DIAGNOSTIC FINDINGS:  EXAM: 2024 DG HIP (WITH OR WITHOUT PELVIS) 2-3V RIGHT   COMPARISON:  None Available.    FINDINGS: There is no evidence of hip fracture or dislocation. There is no evidence of arthropathy or other focal bone abnormality.   IMPRESSION: Negative.  IMPRESSION:  2014  1. Multilevel slight degenerative disc  disease with no neural  impingement.  2. Fairly severe bilateral facet arthritis and foraminal stenosis  at T10-11.  3.  Multilevel degenerative facet arthritis.    IMPRESSION 2014: The patient has severe degenerative disc and joint  disease at multiple levels of the lumbar spine, essentially  unchanged since the prior CT scan dated 01/29/2011.  Most severe  neural impingement is at L2-3 where there is moderately severe  spinal stenosis.  There is chronic encroachment on the right  lateral recess at L3-4 and on the left lateral recess at L1-2.  PATIENT SURVEYS:  Modified Oswestry 23/50, moderate disability   SCREENING FOR RED FLAGS: Bowel or bladder incontinence: Yes: bowel >bladder Spinal tumors: No Cauda equina syndrome: No Compression fracture: No Abdominal aneurysm: No  COGNITION: Overall cognitive status: Within functional limits for tasks assessed     SENSATION: WFL  MUSCLE LENGTH: Hamstrings: Right 65 deg; Left 65 deg Signif adaptive shortening of Lt QL, lat, paraspinals and obliques along Lt trunk secondary to scoliotic curve  POSTURE:  significant scoliosis with Lt ilium in contact with lower ribcage  PALPATION: Tender and tight along Lt QL, lat, paraspinals and obliques Tender bil SI joints, gluteals  LUMBAR ROM:   AROM eval  Flexion Fingers to ankles, pain  Extension NT  Right lateral flexion 5, pain  Left lateral flexion 5, pain  Right rotation 50%  Left rotation 50%   (Blank rows = not tested)  LOWER EXTREMITY ROM:     Passive  Right eval Left eval  Hip flexion    Hip extension    Hip abduction    Hip adduction    Hip internal rotation 8 8  Hip external rotation 40 40  Knee flexion    Knee extension    Ankle  dorsiflexion    Ankle plantarflexion    Ankle inversion    Ankle eversion     (Blank rows = not tested)  LOWER EXTREMITY MMT:   Hips 4/5 bil Knees 4/5 bil  LUMBAR SPECIAL TESTS:    FUNCTIONAL TESTS:  Able to balance in SLS on Rt x10", on Lt x 3"  GAIT: Distance walked: short distance within clinic Assistive device utilized: None Level of assistance: Complete Independence Comments: gets quickly SOB  TODAY'S TREATMENT:                                                                                                                              DATE:  03/31/23:  Discussed benefits of aquatic PT and gave aquatic info handout - Pt willing to try Rt SL elongation STG to Lt QL, paraspinals, lat, bil SI joints and hips   PATIENT EDUCATION:  Education details: Dance movement psychotherapist Person educated: Patient Education method: Chief Technology Officer Education comprehension: verbalized understanding  HOME EXERCISE PROGRAM: Aquatic info - willing to try Has land based stretches and postural strength with yellow tband from previous sessions  ASSESSMENT:  CLINICAL IMPRESSION: Patient is a  89 y.o. female who was seen today for physical therapy evaluation and treatment for back pain related to severe scoliosis.  Pt's curvature causes Lt ilium to be in contact with lower ribcage.  She is a previous patient and has chronic constant pain 5/10 along left shortened side of spine.  She has signif cardiovascular compromise with SOB with household and short community ambulation, and has to eat in very small portions due to structural compression of abdominal contents.  She does have cardiac history with cardioversion in 12/2021. She admits she has not been very active since she was last seen here in March 2024.  The heat prevents her from walking and she reports she has lost ability to walk around cul-de-sac even one time now.  She has physical pain and depression affecting her pain experience and activity  levels.  She presents with limited lumbo-pelvic-hip and thoracic mobility secondary to structural aspect of spine with adaptive shortening along Lt trunk.  LE strength is 4/5 throughout.  Pt has HEP for stretches and yellow tband postural therex from last round of PT earlier this year but has not been compliant.   After long discussion, Pt agreed to try aquatic PT alongside land based PT given the benefits of the environment for exercise.  She has previously benefitted from [facility] to shortened muscles as well which will be incorporated into land -based sessions.    OBJECTIVE IMPAIRMENTS: cardiopulmonary status limiting activity, decreased activity tolerance, decreased balance, decreased endurance, decreased mobility, decreased ROM, decreased strength, impaired flexibility, improper body mechanics, postural dysfunction, and pain.   ACTIVITY LIMITATIONS: carrying, lifting, bending, standing, squatting, sleeping, stairs, transfers, bed mobility, continence, and locomotion level  PARTICIPATION LIMITATIONS: meal prep, cleaning, laundry, driving, shopping, and community activity  PERSONAL FACTORS: Age, Time since onset of injury/illness/exacerbation, and 1-2 comorbidities: scoliosis and cardiac/pulmonary history  are also affecting patient's functional outcome.   REHAB POTENTIAL: Good  CLINICAL DECISION MAKING: Stable/uncomplicated  EVALUATION COMPLEXITY: Low   GOALS: Goals reviewed with patient? Yes  SHORT TERM GOALS: Target date: 04/28/23  Pt will trial and participate in aquatic PT and be able to fully participate in sessions with breaks as needed for SOB. Baseline: Goal status: INITIAL  2.  Pt will be able to perform an aquatic to demo improved endurance in buoyant environment. Baseline:  Goal status: INITIAL  3.  Pt will report improved Lt flank pain by at least 25% most days of the week. Baseline:  Goal status: INITIAL    LONG TERM GOALS: Target date: 05/26/23  Pt will report  improved pain level down to 3-4/10 at least several days of the week. Baseline:  Goal status: INITIAL  2.  Pt will be able to perform up to 15 min of standing household chores without exacerbation of symptoms Baseline:  Goal status: INITIAL  3.  Pt will be compliant and ind with HEP to help manage chronic pain. Baseline:  Goal status: INITIAL  4.  Pt will be able to participate in a with breaks as needed for SOB to demo improved endurance. Baseline:  Goal status: INITIAL    PLAN:  PT FREQUENCY: 2x/week  PT DURATION: 8 weeks  PLANNED INTERVENTIONS: Therapeutic exercises, Therapeutic activity, Neuromuscular re-education, Balance training, Gait training, Patient/Family education, Self Care, Joint mobilization, Aquatic Therapy, Electrical stimulation, Spinal mobilization, Cryotherapy, Moist heat, and Manual therapy.  PLAN FOR NEXT SESSION:  aquatics - work on gait endurance, stretching Lt side of trunk and bil hamstrings, core stabilization, functional postural  strength - has cardiopulmonary history and gets SOB  Land-based: more focused on manual techniques: STM with some NuStep, gait endurance, postural strength as tol   Donnella Morford, PT 03/31/23 1:54 PM

## 2023-04-01 ENCOUNTER — Ambulatory Visit: Payer: Self-pay

## 2023-04-01 NOTE — Patient Outreach (Signed)
  Care Coordination   Attempt   Visit Note   04/01/2023 Name: Janet Nguyen MRN: [id number] DOB: 1932/04/30  Janet Nguyen is a 89 y.o. year old female who sees [name], MD for primary care. I  attempted to reach patient for a telephone assessment and was unsuccessful..  I have left a message requesting a call back.        SDOH assessments and interventions completed:  No     Care Coordination Interventions:  No, not indicated   Follow up plan:  awaiting a call back    Encounter Outcome:  No Answer   Rowe Pavy, RN, BSN, CEN [facility] Coordinator [phone]

## 2023-04-03 ENCOUNTER — Telehealth: Payer: Self-pay | Admitting: *Deleted

## 2023-04-03 NOTE — Progress Notes (Unsigned)
  Care Coordination Note  04/03/2023 Name: Janet Nguyen MRN: [id number] DOB: September 27, 1932  Janet Nguyen is a 89 y.o. year old female who is a primary care patient of [name], MD and is actively engaged with the care management team. I reached out to [name] by phone today to assist with re-scheduling an initial visit with the RN Case Manager  Follow up plan: Unsuccessful telephone outreach attempt made. A HIPAA compliant phone message was left for the patient providing contact information and requesting a return call.   [facility]  Care Coordination Care Guide  Direct Dial: [phone]

## 2023-04-06 NOTE — Progress Notes (Signed)
  Care Coordination Note  04/06/2023 Name: Janet Nguyen MRN: [id number] DOB: [birth year]  [employer] is a 89 y.o. year old female who is a primary care patient of [name], MD and is actively engaged with the care management team. I reached out to [name] by phone today to assist with re-scheduling an initial visit with the RN Case Manager  Follow up plan: Unsuccessful telephone outreach attempt made. A HIPAA compliant phone message was left for the patient providing contact information and requesting a return call.  We have been unable to make contact with the patient for follow up. The care management team is available to follow up with the patient after provider conversation with the patient regarding recommendation for care management engagement and subsequent re-referral to the care management team.   [facility] Coordination Care Guide  Direct Dial: [phone]

## 2023-04-07 ENCOUNTER — Other Ambulatory Visit (HOSPITAL_BASED_OUTPATIENT_CLINIC_OR_DEPARTMENT_OTHER): Payer: Medicare Other

## 2023-04-07 ENCOUNTER — Ambulatory Visit (INDEPENDENT_AMBULATORY_CARE_PROVIDER_SITE_OTHER): Payer: Medicare Other

## 2023-04-07 DIAGNOSIS — I272 Pulmonary hypertension, unspecified: Secondary | ICD-10-CM | POA: Diagnosis not present

## 2023-04-07 LAB — ECHOCARDIOGRAM COMPLETE
Area-P 1/2: 4.89 cm2
MV M vel: 4.05 m/s
MV Peak grad: 65.6 mmHg
Radius: 0.7 cm
S' Lateral: 2.49 cm

## 2023-04-08 ENCOUNTER — Telehealth: Payer: Self-pay | Admitting: Internal Medicine

## 2023-04-08 ENCOUNTER — Ambulatory Visit: Payer: Medicare Other | Admitting: Primary Care

## 2023-04-08 ENCOUNTER — Telehealth: Payer: Self-pay | Admitting: Student

## 2023-04-08 NOTE — Progress Notes (Unsigned)
Cardiology Clinic Note   Date: 04/20/2023 ID: Janet Nguyen, Janet Nguyen 1932-01-22, MRN [id number]  Primary Cardiologist:  Nicki Guadalajara, MD  Patient Profile    Janet Nguyen is a 89 y.o. female who presents to the clinic today for follow up.     Past medical history significant for: PAF. Onset March 2014. SVT/PVCs/PACs. Chronic diastolic heart failure. Echo 04/07/2023: EF 55-60%. Diastolic function could not be evaluated. Normal RV function. Moderately elevated PA pressure. Severe BAE. Moderate to severe mitral valve regurgitation. Moderate prolapse of middle scallop of posterior leaflet of mitral valve. Mild to moderate TR.  Pulmonary hypertension. Migraines. Hypertension. OSA. Interstitial lung disease. PE. Pleural effusion. CT chest 02/20/2023: No significant change in the moderate partially loculated right and small dependent left pleural effusions with associated compressive atelectasis in both lower lobes.  Consider thoracentesis if the etiology for these effusions is unknown.  New small focus of airspace disease or atelectasis medially in the left upper lobe.  No suspicious pulmonary nodules.  Stable cardiomegaly and central enlargement of the pulmonary arteries consistent with underlying pulmonary arterial hypertension.  Small stable mediastinal lymph nodes, likely reactive. Chest x-ray 03/02/2023: Persisting right greater than left pleural effusion with associated atelectasis/consolidation.  Questionable interval worsening of CHF from prior CT.      History of Present Illness    Janet Nguyen is a longtime patient of cardiology.  She is followed by Dr. Tresa Endo for the above outlined history.   Patient was last seen in the office by Dr. Tresa Endo on 12/22/2022 for follow-up.  She was doing well at that time and no changes were made.  She inquired on stopping Eliquis but Dr. Tresa Endo feels she needs to remain on this given PAF and remote history of PE.   Patient underwent CT chest  02/20/2023 which showed pleural effusions.  She was evaluated by Dr. Marchelle Gearing on 03/02/2023 who felt the best option for management of pleural effusion was diuresis.  Chest x-ray 03/02/2023 showed persistent pleural effusion and questionable worsening of CHF.  BNP on 03/02/2023 was 1382.  Patient was evaluated by me on 03/10/2023 reports of lower extremity edema extending up the legs to abdomen.  Patient reported her labia was edematous as well.  She reported weight gain and DOE.  No orthopnea or PND. She reported dietary indiscretion with eating a lot of deli meat and cheese, as she does not cook for herself.  Torsemide was increased to 40 mg in the a.m. and 20 mg in the p.m. x 5 days then 20 mg bid  thereafter.   Patient was last seen in the office by me on 03/20/2023 for close follow-up.  At the time of her visit she was down 8 pounds and reported significantly improved lower extremity edema.  She was pending echo at that time.  She continued to complain of rash she felt was caused by Eliquis.  However, she did not want to change to Xarelto secondary to expense.  She was encouraged to begin over-the-counter allergy medication.  Patient underwent echo on 04/07/2023 which showed normal LV/RV function, moderately elevated PA pressure, severe BAE, moderate to severe mitral valve regurgitation with moderate prolapse of mitral valve (detailed above).  On the day of her echo I was contacted secondary to hypotension.  I spoke with patient via phone and she was asymptomatic with no complaints.  Weight was stable.  She was instructed to begin checking BP daily and contact the office if SBP <100 or she began  experiencing symptoms such as lightheadedness, dizziness, or presyncope.  Today, patient reports resolution of lower extremity edema. Her weight is down to 106 lb at home and has been stable. She reports DOE when walking outside but is able to walk around the grocery store while holding a cart with only occasional dyspnea.  She denies orthopnea or PND. She has a lot of questions about her medical conditions. She is unsure if she is supposed to renew inhalers prescribed by pulmonary and is uncertain if she is using the inhalers correctly. She is encouraged again to set up a follow up visit with pulmonary to discuss her concerns. She continues to live alone and admits she needs some assistance. Her daughter lives in [place] and wants her to move into a facility but patient does not want to do that. She continues to complain of itching caused by Eliquis. She was supposed to start over the counter allergy medication to see if this helped relieve her symptoms, as she does not want to change to Xarelto, as it is too expensive. She is encouraged to try over the counter allergy medication. Echo results discussed in detail.     ROS: All other systems reviewed and are otherwise negative except as noted in History of Present Illness.  Studies Reviewed    EKG Interpretation Date/Time:  Monday April 20 2023 11:01:46 EDT Ventricular Rate:  104 PR Interval:    QRS Duration:  80 QT Interval:  336 QTC Calculation: 441 R Axis:   -78  Text Interpretation: Atrial fibrillation with rapid ventricular response Left axis deviation Septal infarct (cited on or before 04-Feb-2022) When compared with ECG of 20-Mar-2023 14:04, Nonspecific T wave abnormality now evident in Inferior leads Confirmed by Carlos Levering [id number]) on 04/20/2023 11:05:54 AM    Risk Assessment/Calculations     CHA2DS2-VASc Score = 4   This indicates a 4.8% annual risk of stroke. The patient's score is based upon: CHF History: 1 HTN History: 0 Diabetes History: 0 Stroke History: 0 Vascular Disease History: 0 Age Score: 2 Gender Score: 1             Physical Exam    VS:  BP 120/80 (BP Location: Left Arm, Patient Position: Sitting, Cuff Size: Normal)   Pulse (!) 104   Ht 5\' 1"  (1.549 m)   Wt 109 lb (49.4 kg)   SpO2 98%   BMI 20.60 kg/m  , BMI  Body mass index is 20.6 kg/m.  GEN: Well nourished, well developed, in no acute distress. Neck: No JVD or carotid bruits. Cardiac: Irregular rhythm. No murmurs. No rubs or gallops.   Respiratory:  Respirations regular and unlabored. Clear to auscultation without rales, wheezing or rhonchi. GI: Soft, nontender, nondistended. Extremities: Radials/DP/PT 2+ and equal bilaterally. No clubbing or cyanosis. No edema.  Skin: Warm and dry, no rash. Neuro: Strength intact. Psychiatric: Agitated/anxious affect.   Assessment & Plan     PAF.  Onset March 2014.  Patient denies palpitations.  No spontaneous bleeding concerns.  EKG shows A-fib, 104 bpm.  Patient appears more agitated today than in prior visits. Continue metoprolol, diltiazem, digoxin, Eliquis. Appropriate Eliquis dose. Eliquis samples provided today.  Chronic diastolic heart failure/lower extremity edema.  Echo July 2024 showed normal LV/RV function, moderate elevated PA pressure, severe BAE. Patient reports lower extremity edema is resolved and weight is stable at 106 lb. She reports chronic DOE with walking in her neighborhood but is able to walk in the grocery store while holding onto  a cart with only occasional dyspnea. No orthopnea or PND. Clear breath sounds without wheezing, rales or rhonchi on exam today. Continue metoprolol and Torsemide.  Mitral valve prolapse/mitral insufficiency.  Echo July 2024 showed moderate to severe mitral valve regurgitation with moderate prolapse of middle scallop of posterior leaflet.  Patient reports dyspnea with walking outside but able to walk in grocery store while holding onto cart. Suggested she try walking outside with a walker to see if that improves her dyspnea.  Disposition: Return in 6 months or sooner as needed.          Signed, Etta Grandchild. Daveigh Batty, DNP, NP-C

## 2023-04-08 NOTE — Telephone Encounter (Signed)
Spoke with patient via telephone on 04/07/2023 regarding her low BP during her echo. Patient reports she is feeling fine. She has not been checking her BP at home. Her edema is managed with torsemide and weight has been stable. She will begin checking BP daily. She is instructed to contact the office if SBP is <100 consistently or if she begins experiencing lightheadedness, dizziness, or presyncope. She verbalized understanding.   Etta Grandchild. Anzel Kearse, DNP, NP-C  04/08/2023, 7:08 AM [facility] [address] Office [phone] Fax [phone]

## 2023-04-08 NOTE — Telephone Encounter (Signed)
This PT just canceled her appt later today stating she was not sure why she was even coming. I looked at the AVS notes and explained Dr. Elvera Lennox req she see you to follow up to insure all was going well and possibly to monitor medications and TX plan. She was rather Kandis Ban when I went to resched her and dcln my offer for a RN (Triage) to call her w/more specifics since I was clerical, not clinical.I let NP know and she said to let Dr. Elvera Lennox know.

## 2023-04-08 NOTE — Telephone Encounter (Signed)
ATC LMTCB x1 was suppose to f/up with Buelah Manis, NP to see how she was doing with the Spiriva samples giving and if working extend spiriva script.

## 2023-04-09 DIAGNOSIS — R300 Dysuria: Secondary | ICD-10-CM | POA: Diagnosis not present

## 2023-04-09 DIAGNOSIS — Z6821 Body mass index (BMI) 21.0-21.9, adult: Secondary | ICD-10-CM | POA: Diagnosis not present

## 2023-04-10 ENCOUNTER — Telehealth: Payer: Self-pay | Admitting: Internal Medicine

## 2023-04-10 NOTE — Progress Notes (Signed)
Lot of echo abnormalities. Please keep appt with cardiology later in July 2024

## 2023-04-10 NOTE — Telephone Encounter (Signed)
Hi Dr Tresa Endo and Gavin Pound  Lot of echo abnromalities for [facility]. She is seeing Gavin Pound in a week or so   THanks    SIGNATURE    Dr. Kalman Shan, M.D., F.C.C.P,  Pulmonary and Critical Care Medicine Staff Physician, [facility] Director - Interstitial Lung Disease  Program  Pulmonary Fibrosis [facility] [place], [place], [address]   Pager: [phone], If no answer  -> Check AMION or Try [phone] Telephone (clinical office): [phone] Telephone (research): [phone]  10:58 AM 04/10/2023    ECHOCARDIOGRAM REPORT       Patient Name:   Janet Nguyen [facility] Date of Exam: 04/07/2023  Medical Rec #:  [id number]         Height:       65.0 in  Accession #:    [id number]        Weight:       112.2 lb  Date of Birth:  10-15-1931         BSA:          1.547 m  Patient Age:    89 years          BP:           92/60 mmHg  Patient Gender: F                 HR:           85 bpm.  Exam Location:  Outpatient   Procedure: 2D Echo, 3D Echo, Cardiac Doppler, Color Doppler and Strain  Analysis   Indications:   PHTN    History:        Patient has prior history of Echocardiogram examinations,  most                 recent 12/03/2021. CHF, Arrythmias:Atrial Flutter, PVC, PAC  and                 Atrial Fibrillation; Risk Factors:Non-Smoker and  Hypertension.                 Pulmonary Embolism.    Sonographer:    Jeryl Columbia RDCS  Referring Phys: [phone] [facility]     Sonographer Comments: Image acquisition challenging due to patient body  habitus and limited mobility.  IMPRESSIONS     1. Left ventricular ejection fraction, by estimation, is 55 to 60%. The  left ventricle has normal function. The left ventricle has no regional  wall motion abnormalities. Left ventricular diastolic function could not  be evaluated.   2. Right ventricular systolic function is normal. The right ventricular  size is  normal. There is moderately elevated pulmonary artery systolic  pressure. The estimated right ventricular systolic pressure is 51.0 mmHg.   3. Left atrial size was severely dilated.   4. Right atrial size was severely dilated.   5. The mitral valve is myxomatous. Moderate to severe mitral valve  regurgitation. No evidence of mitral stenosis. There is moderate  holosystolic prolapse of the middle scallop of the posterior leaflet of  the mitral valve.   6. Tricuspid valve regurgitation is mild to moderate.   7. The aortic valve is tricuspid. Aortic valve regurgitation is not  visualized. Aortic valve sclerosis/calcification is present, without any  evidence of aortic stenosis.   8. Pulmonic valve regurgitation is moderate.   9. The inferior vena cava is normal in size with <  50% respiratory  variability, suggesting right atrial pressure of 8 mmHg.  10. Consider TEE if clinically indicated and if patient is a candidate for  mitraclip.

## 2023-04-16 ENCOUNTER — Ambulatory Visit: Payer: Medicare Other | Admitting: Physical Therapy

## 2023-04-20 ENCOUNTER — Encounter: Payer: Self-pay | Admitting: Student

## 2023-04-20 ENCOUNTER — Ambulatory Visit: Payer: Medicare Other | Attending: Student | Admitting: Student

## 2023-04-20 VITALS — BP 120/80 | HR 104 | Ht 61.0 in | Wt 109.0 lb

## 2023-04-20 DIAGNOSIS — R600 Localized edema: Secondary | ICD-10-CM | POA: Insufficient documentation

## 2023-04-20 DIAGNOSIS — I340 Nonrheumatic mitral (valve) insufficiency: Secondary | ICD-10-CM | POA: Diagnosis not present

## 2023-04-20 DIAGNOSIS — I480 Paroxysmal atrial fibrillation: Secondary | ICD-10-CM | POA: Diagnosis not present

## 2023-04-20 DIAGNOSIS — I341 Nonrheumatic mitral (valve) prolapse: Secondary | ICD-10-CM | POA: Insufficient documentation

## 2023-04-20 DIAGNOSIS — I5032 Chronic diastolic (congestive) heart failure: Secondary | ICD-10-CM | POA: Diagnosis not present

## 2023-04-20 MED ORDER — APIXABAN 5 MG PO TABS: 5.0000 mg | ORAL_TABLET | Freq: Two times a day (BID) | ORAL | Status: DC

## 2023-04-20 MED ORDER — APIXABAN 5 MG PO TABS: 5.0000 mg | ORAL_TABLET | Freq: Every day | ORAL | Status: DC

## 2023-04-20 NOTE — Patient Instructions (Signed)
Medication Instructions:  No changes *If you need a refill on your cardiac medications before your next appointment, please call your pharmacy*   Lab Work: none If you have labs (blood work) drawn today and your tests are completely normal, you will receive your results only by: MyChart Message (if you have MyChart) OR A paper copy in the mail If you have any lab test that is abnormal or we need to change your treatment, we will call you to review the results.   Testing/Procedures: none   Follow-Up: At [facility], you and your health needs are our priority.  As part of our continuing mission to provide you with exceptional heart care, we have created designated Provider Care Teams.  These Care Teams include your primary Cardiologist (physician) and Advanced Practice Providers (APPs -  Physician Assistants and Nurse Practitioners) who all work together to provide you with the care you need, when you need it.  We recommend signing up for the patient portal called "MyChart".  Sign up information is provided on this After Visit Summary.  MyChart is used to connect with patients for Virtual Visits (Telemedicine).  Patients are able to view lab/test results, encounter notes, upcoming appointments, etc.  Non-urgent messages can be sent to your provider as well.   To learn more about what you can do with MyChart, go to [email or web address].    Your next appointment:   4-6 month(s)  Provider:   Nicki Guadalajara, MD

## 2023-04-22 ENCOUNTER — Encounter: Payer: Self-pay | Admitting: Physical Therapy

## 2023-04-22 ENCOUNTER — Ambulatory Visit: Payer: Medicare Other | Attending: Family | Admitting: Physical Therapy

## 2023-04-22 DIAGNOSIS — R279 Unspecified lack of coordination: Secondary | ICD-10-CM | POA: Diagnosis present

## 2023-04-22 DIAGNOSIS — M6281 Muscle weakness (generalized): Secondary | ICD-10-CM | POA: Insufficient documentation

## 2023-04-22 DIAGNOSIS — R293 Abnormal posture: Secondary | ICD-10-CM | POA: Insufficient documentation

## 2023-04-22 NOTE — Therapy (Signed)
OUTPATIENT PHYSICAL THERAPY FEMALE PELVIC EVALUATION   Patient Name: Janet Nguyen MRN: [id number] DOB:April 09, 1932, 89 y.o., female Today's Date: 04/22/2023  END OF SESSION:  PT End of Session - 04/22/23 1222     Visit Number 1   pelvic   Date for PT Re-Evaluation 07/15/23    Authorization Type medicare - needs KX at visit 15    PT Start Time 1102    PT Stop Time 1145    PT Time Calculation (min) 43 min    Activity Tolerance Patient tolerated treatment well    Behavior During Therapy [facility] for tasks assessed/performed             Past Medical History:  Diagnosis Date   Arrhythmia    History of SVT with documented PVC'S and  PAC'S  12/08/12 Nuc stress test normal LV EF 74%  Event Monitor  12/01/12-01/03/13   Atrial flutter (HCC)    Celiac disease    treated by Dr. Kinnie Scales   GERD (gastroesophageal reflux disease)    Hypertension    Intervertebral disc stenosis of neural canal of cervical region    Irregular heart beat 11/30/2012   ECHO-EF 60-65%   Osteoporosis    PMR (polymyalgia rheumatica) (HCC)    Dr. Mallie Mussel; pt states she was diagnosed 10-15 years ago, not treated at this time or any issues that she is aware of.   Scoliosis    Scoliosis    Sleep apnea 10/02/11 [facility]   Sleep study AHI -total sleep 10.3/hr  64.0/ hr during REM sleep.RDI 22.8/hr during total sleep 64.0/hr during REM sleep The lowest O2 sat during Non-REM and REM sleep was 86% and 88% respectively. 04/08/12 [facility]AP[facility]BIPAP titration [facility]   Past Surgical History:  Procedure Laterality Date   APPENDECTOMY     ruptured at age 53 and had surgery   CARDIAC CATHETERIZATION  01/27/06   CARDIOVERSION N/A 01/08/2022   Procedure: CARDIOVERSION;  Surgeon: Meriam Sprague, MD;  Location: [facility] ENDOSCOPY;  Service: Cardiovascular;  Laterality: N/A;   cataract surgery  2015   Dr. Elmer Picker; March & April 2015   Patient Active Problem List   Diagnosis Date Noted    Secondary hypercoagulable state (HCC) 12/12/2021   Hyponatremia 12/04/2021   Acute CHF (congestive heart failure) (HCC) 12/02/2021   Acute on chronic diastolic CHF (congestive heart failure) (HCC) 12/02/2021   Atrial fibrillation with rapid ventricular response (HCC) 12/02/2021   Essential hypertension 12/02/2021   GERD without esophagitis 12/02/2021   Obstructive sleep apnea 12/02/2021   Interstitial lung disease (HCC) 12/02/2021   History of pulmonary embolism 12/02/2021   Pulmonary embolism (HCC) 11/06/2021   Bronchiectasis (HCC) 11/06/2021   Deviated septum 05/08/2020   Mass of subcutaneous tissue 05/02/2020   Vertigo 06/16/2019   Pulmonary hypertension, unspecified (HCC) 04/14/2019   Ischemic colitis (HCC) 04/14/2019   Migraine with aura and without status migrainosus, not intractable 11/08/2018   Degeneration of lumbar intervertebral disc 10/14/2018   Hoarseness of voice 03/04/2018   Abdominal pain 07/01/2017   Diverticulitis, colon    Metabolic acidosis, increased anion gap    Constipation 04/14/2017   Lumbar hernia 04/14/2017   Presbycusis of both ears 01/10/2017   Tinnitus aurium, bilateral 01/10/2017   Gastroesophageal reflux disease 08/20/2016   Hemoptysis 08/20/2016   Obstructive sleep apnea of adult 08/20/2016   Rhinitis, chronic 08/20/2016   Throat pain in adult 08/20/2016   Fatigue 12/23/2015   Sciatica of right side 10/06/2015  History of migraine headaches 10/06/2015   Frequent PVCs 12/28/2013   Premature atrial contractions 12/28/2013   PSVT (paroxysmal supraventricular tachycardia) (HCC) 12/28/2013   Heart palpitations 07/13/2013   Sleep apnea 04/11/2013   Scoliosis 04/11/2013   Atypical atrial flutter (HCC) 11/29/2012   Chest pain, atypical 11/29/2012   Fibromyalgia syndrome 11/29/2012   Chronic steroid use 11/29/2012    PCP: [name], MD   REFERRING PROVIDER:    Randa Spike, FNP    REFERRING DIAG: R39.89 (ICD-10-CM) - Other  symptoms and signs involving the genitourinary system   THERAPY DIAG:  Muscle weakness (generalized)  Unspecified lack of coordination  Rationale for Evaluation and Treatment: Rehabilitation  ONSET DATE: started earlier this year  SUBJECTIVE:                                                                                                                                                                                           SUBJECTIVE STATEMENT: I can't get to the bathroom every once in a while.  Sometimes now having fecal incontinence and seeing a little in the pad sometimes but I do not feel it.  This started when I felt pain with urinating and it wasn't a UTI. Fecal leakage happens not frequency but did last night.  My weight is going down because of my appetite being low Fluid intake: Yes: water    PAIN:  Are you having pain? Yes NPRS scale: x/10 - not really a pain it is itchy and inflamed and using the A&D and wipes Pain location: External  Pain type: burning Pain description: intermittent   Aggravating factors: just always irritated Relieving factors: not sure  PRECAUTIONS: None  RED FLAGS: None   WEIGHT BEARING RESTRICTIONS: No  FALLS:  Has patient fallen in last 6 months? No  LIVING ENVIRONMENT: Lives with: lives alone Lives in: House/apartment   OCCUPATION: retired - exercise walking with shopping cart  PLOF: Independent  PATIENT GOALS: have less leakage/urgency  PERTINENT HISTORY:  Scoliosis, lung and heart disease, 4 vaginal deliveries   BOWEL MOVEMENT: Pain with bowel movement: No Type of bowel movement:Type (Bristol Stool Scale) solid and normal usually, skinny, Frequency daily, and Strain No Fully empty rectum: Yes: I think so Leakage: Yes:   Pads: Yes: 1 at night and 1 morning unless something happens Fiber supplement: No  URINATION: Pain with urination: No Fully empty bladder: Yes: I don't know Stream:  not sure Urgency: Yes: more  than once a week Frequency: sometimes every hour, sometimes 1-2/day; wakes up due to back and then I just go to the bathroom Leakage: Urge to void and  Walking to the bathroom Pads: Yes: 1 in morning and 1 at night   PREGNANCY: Vaginal deliveries 4   PROLAPSE:    OBJECTIVE:   DIAGNOSTIC FINDINGS:    PATIENT SURVEYS:    COGNITION: Overall cognitive status: Within functional limits for tasks assessed       GAIT:  Comments: wide BOS  POSTURE:  severe scoliosis  PELVIC ALIGNMENT:  LUMBARAROM/PROM: See ortho notes  LOWER EXTREMITY ROM:  See ortho notes  LOWER EXTREMITY MMT:  See ortho notes PALPATION:                  External Perineal Exam redness of vulva, small amount of stool                             Internal Pelvic Floor - not fully relax in between reps, hold for 2 seconds only, 5 quick flicks with diminishing strength  Patient confirms identification and approves PT to assess internal pelvic floor and treatment Yes  PELVIC MMT:   MMT eval  Vaginal   Internal Anal Sphincter 3/5  External Anal Sphincter 3/5  Puborectalis 3+/5  Diastasis Recti   (Blank rows = not tested)        TONE: Slightly lower  PROLAPSE: Not noted (did not do anterior pelvic assessment)  TODAY'S TREATMENT:                                                                                                                              DATE: 04/22/23  EVAL and toileting techniques as well as sample for vaginal moisturizers given today   PATIENT EDUCATION:  Education details: see above Person educated: Patient Education method: Chief Technology Officer Education comprehension: verbalized understanding  HOME EXERCISE PROGRAM: Not issued today  ASSESSMENT:  CLINICAL IMPRESSION: Patient is a 89 y.o. female who was seen today for physical therapy evaluation and treatment for bladder urge and fecal incontinence. Pt has long history and needed extra time to discuss so  only assessed pelvic floor today.  Pt has 3/5 MMT of anal sphincters and can do 5 reps.  Pt can only hold for 2 seconds. Pt has dyssynergia and every attempt to push, pt was tightening pelvic floor muscles. Pt will benefit from skilled PT to work on pelvic floor strength and coordination.  Pt is currently being seen for back issues from scoliosis by orhto PT so at this time pelvic PT will focus on pelvic floor and deep core muscles strength and coordination during treatments.  OBJECTIVE IMPAIRMENTS: decreased coordination, decreased endurance, decreased ROM, decreased strength, impaired tone, and postural dysfunction.   ACTIVITY LIMITATIONS: lifting, bending, standing, sleeping, continence, toileting, and locomotion level  PARTICIPATION LIMITATIONS: community activity  PERSONAL FACTORS: Age, Past/current experiences, and 1-2 comorbidities: 4 vaginal deliveries, scolosis  are also affecting patient's functional outcome.   REHAB POTENTIAL: Good  CLINICAL DECISION MAKING: Evolving/moderate complexity  EVALUATION COMPLEXITY:  Moderate   GOALS: Goals reviewed with patient? Yes  SHORT TERM GOALS: Target date: 05/20/23  Ind with initial HEP Baseline: Goal status: INITIAL  2.  Ind with urge and toileting techniques Baseline:  Goal status: INITIAL  LONG TERM GOALS: Target date: 07/15/23  Pt will report having feeling of when there is stool in the anal canal (can feel it coming out) Baseline:  Goal status: INITIAL  2.  Pt will be independent with advanced HEP to maintain improvements made throughout therapy  Baseline:  Goal status: INITIAL  3.  Pt will report less pain and itchiness of the vulva due to improved skin care Baseline:  Goal status: INITIAL  4.  Pt will be able to contract and hold pelvic floor for at least 10 seconds in order to reduce feeling of urgency. Baseline:  Goal status: INITIAL   PLAN:  PT FREQUENCY: 1x/week  PT DURATION: 12 weeks  PLANNED  INTERVENTIONS: Therapeutic exercises, Therapeutic activity, Neuromuscular re-education, Balance training, Gait training, Patient/Family education, Self Care, Joint mobilization, Dry Needling, Electrical stimulation, Cryotherapy, Moist heat, Taping, Traction, Biofeedback, Manual therapy, and Re-evaluation  PLAN FOR NEXT SESSION: tactile cues or RUSI ultrasound for working on bulging pelvic floor, BSURE pads   [employer], PT 04/22/2023, 12:23 PM

## 2023-04-24 ENCOUNTER — Encounter: Payer: Self-pay | Admitting: Physical Therapy

## 2023-04-24 ENCOUNTER — Ambulatory Visit: Payer: Medicare Other | Admitting: Physical Therapy

## 2023-04-24 DIAGNOSIS — M546 Pain in thoracic spine: Secondary | ICD-10-CM

## 2023-04-24 DIAGNOSIS — M545 Low back pain, unspecified: Secondary | ICD-10-CM

## 2023-04-24 DIAGNOSIS — M4125 Other idiopathic scoliosis, thoracolumbar region: Secondary | ICD-10-CM

## 2023-04-24 DIAGNOSIS — M6281 Muscle weakness (generalized): Secondary | ICD-10-CM | POA: Diagnosis not present

## 2023-04-24 DIAGNOSIS — M542 Cervicalgia: Secondary | ICD-10-CM | POA: Diagnosis not present

## 2023-04-24 DIAGNOSIS — R293 Abnormal posture: Secondary | ICD-10-CM | POA: Diagnosis not present

## 2023-04-24 DIAGNOSIS — M62830 Muscle spasm of back: Secondary | ICD-10-CM

## 2023-04-24 NOTE — Therapy (Signed)
OUTPATIENT PHYSICAL THERAPY THORACOLUMBAR TREATMENT   Patient Name: Janet Nguyen MRN: [id number] DOB:[birth year], 89 y.o., female Today's Date: 04/24/2023  END OF SESSION:  PT End of Session - 04/24/23 1102     Visit Number 2   lumbar   Date for PT Re-Evaluation 05/26/23   ortho   Authorization Type medicare - needs KX at visit 15    PT Start Time 1102    PT Stop Time 1145    PT Time Calculation (min) 43 min    Activity Tolerance Patient tolerated treatment well    Behavior During Therapy [facility] for tasks assessed/performed              Past Medical History:  Diagnosis Date   Arrhythmia    History of SVT with documented PVC'S and  PAC'S  12/08/12 Nuc stress test normal LV EF 74%  Event Monitor  12/01/12-01/03/13   Atrial flutter (HCC)    Celiac disease    treated by Dr. Kinnie Scales   GERD (gastroesophageal reflux disease)    Hypertension    Intervertebral disc stenosis of neural canal of cervical region    Irregular heart beat 11/30/2012   ECHO-EF 60-65%   Osteoporosis    PMR (polymyalgia rheumatica) (HCC)    Dr. Mallie Mussel; pt states she was diagnosed 10-15 years ago, not treated at this time or any issues that she is aware of.   Scoliosis    Scoliosis    Sleep apnea 10/02/11 [facility]   Sleep study AHI -total sleep 10.3/hr  64.0/ hr during REM sleep.RDI 22.8/hr during total sleep 64.0/hr during REM sleep The lowest O2 sat during Non-REM and REM sleep was 86% and 88% respectively. 04/08/12 CPAP/BIPAP titration study [facility]   Past Surgical History:  Procedure Laterality Date   APPENDECTOMY     ruptured at age 26 and had surgery   CARDIAC CATHETERIZATION  01/27/06   CARDIOVERSION N/A 01/08/2022   Procedure: CARDIOVERSION;  Surgeon: Meriam Sprague, MD;  Location: [facility] ENDOSCOPY;  Service: Cardiovascular;  Laterality: N/A;   cataract surgery  2015   Dr. Elmer Picker; March & April 2015   Patient Active Problem List   Diagnosis Date  Noted   Secondary hypercoagulable state (HCC) 12/12/2021   Hyponatremia 12/04/2021   Acute CHF (congestive heart failure) (HCC) 12/02/2021   Acute on chronic diastolic CHF (congestive heart failure) (HCC) 12/02/2021   Atrial fibrillation with rapid ventricular response (HCC) 12/02/2021   Essential hypertension 12/02/2021   GERD without esophagitis 12/02/2021   Obstructive sleep apnea 12/02/2021   Interstitial lung disease (HCC) 12/02/2021   History of pulmonary embolism 12/02/2021   Pulmonary embolism (HCC) 11/06/2021   Bronchiectasis (HCC) 11/06/2021   Deviated septum 05/08/2020   Mass of subcutaneous tissue 05/02/2020   Vertigo 06/16/2019   Pulmonary hypertension, unspecified (HCC) 04/14/2019   Ischemic colitis (HCC) 04/14/2019   Migraine with aura and without status migrainosus, not intractable 11/08/2018   Degeneration of lumbar intervertebral disc 10/14/2018   Hoarseness of voice 03/04/2018   Abdominal pain 07/01/2017   Diverticulitis, colon    Metabolic acidosis, increased anion gap    Constipation 04/14/2017   Lumbar hernia 04/14/2017   Presbycusis of both ears 01/10/2017   Tinnitus aurium, bilateral 01/10/2017   Gastroesophageal reflux disease 08/20/2016   Hemoptysis 08/20/2016   Obstructive sleep apnea of adult 08/20/2016   Rhinitis, chronic 08/20/2016   Throat pain in adult 08/20/2016   Fatigue 12/23/2015   Sciatica of right  side 10/06/2015   History of migraine headaches 10/06/2015   Frequent PVCs 12/28/2013   Premature atrial contractions 12/28/2013   PSVT (paroxysmal supraventricular tachycardia) (HCC) 12/28/2013   Heart palpitations 07/13/2013   Sleep apnea 04/11/2013   Scoliosis 04/11/2013   Atypical atrial flutter (HCC) 11/29/2012   Chest pain, atypical 11/29/2012   Fibromyalgia syndrome 11/29/2012   Chronic steroid use 11/29/2012    PCP: [name], MD  REFERRING PROVIDER: [name], MD  REFERRING DIAG: M41.9 (ICD-10-CM) -  Scoliosis, unspecified  Rationale for Evaluation and Treatment: Rehabilitation  THERAPY DIAG:  Muscle weakness (generalized)  Other idiopathic scoliosis, thoracolumbar region  Cervicalgia  Pain in thoracic spine  Abnormal posture  Chronic bilateral low back pain without sciatica  Muscle spasm of back  ONSET DATE: chronic  SUBJECTIVE:                                                                                                                                                                                           SUBJECTIVE STATEMENT: I am so glad to be back in today.  I have gotten away from my HEP.  I do not want to try aquatics after all b/c I want to focus on this and the pelvic floor PT. The positional advice for going to the bathroom has been helpful so far.   PERTINENT HISTORY:  Severe scoliosis Pulmonary HTN Cardioversion 12/2021 osteoporosis Depression Lives alone, has been treated at this facility many times    PAIN:  PAIN:  Are you having pain? Yes NPRS scale: 6-7/10 Pain location: left flank Pain orientation: Left  PAIN TYPE: aching, dull, and tight Pain description: constant  Aggravating factors: overeating (no room for my stomach), walking brings me SOB Relieving factors: Tylenol but always constant pain   PRECAUTIONS: Other: low bone density  WEIGHT BEARING RESTRICTIONS: No  FALLS:  Has patient fallen in last 6 months? No  LIVING ENVIRONMENT: Lives with: lives alone Lives in: House/apartment Stairs: No Has following equipment at home: None  OCCUPATION: retired  PLOF: Independent  PATIENT GOALS: reduce pain, walk with less SOB, improve flexibility  NEXT MD VISIT: as needed  OBJECTIVE:   DIAGNOSTIC FINDINGS:  EXAM: 2024 DG HIP (WITH OR WITHOUT PELVIS) 2-3V RIGHT   COMPARISON:  None Available.   FINDINGS: There is no evidence of hip fracture or dislocation. There is no evidence of arthropathy or other focal bone  abnormality.   IMPRESSION: Negative.  IMPRESSION:  2014  1. Multilevel slight degenerative disc disease with no neural  impingement.  2. Fairly severe bilateral facet arthritis and foraminal stenosis  at T10-11.  3.  Multilevel degenerative facet arthritis.    IMPRESSION 2014: The patient has severe degenerative disc and joint  disease at multiple levels of the lumbar spine, essentially  unchanged since the prior CT scan dated 01/29/2011.  Most severe  neural impingement is at L2-3 where there is moderately severe  spinal stenosis.  There is chronic encroachment on the right  lateral recess at L3-4 and on the left lateral recess at L1-2.  PATIENT SURVEYS:  Modified Oswestry 23/50, moderate disability   SCREENING FOR RED FLAGS: Bowel or bladder incontinence: Yes: bowel >bladder Spinal tumors: No Cauda equina syndrome: No Compression fracture: No Abdominal aneurysm: No  COGNITION: Overall cognitive status: Within functional limits for tasks assessed     SENSATION: WFL  MUSCLE LENGTH: Hamstrings: Right 65 deg; Left 65 deg Signif adaptive shortening of Lt QL, lat, paraspinals and obliques along Lt trunk secondary to scoliotic curve  POSTURE:  significant scoliosis with Lt ilium in contact with lower ribcage  PALPATION: Tender and tight along Lt QL, lat, paraspinals and obliques Tender bil SI joints, gluteals  LUMBAR ROM:   AROM eval  Flexion Fingers to ankles, pain  Extension NT  Right lateral flexion 5, pain  Left lateral flexion 5, pain  Right rotation 50%  Left rotation 50%   (Blank rows = not tested)  LOWER EXTREMITY ROM:     Passive  Right eval Left eval  Hip flexion    Hip extension    Hip abduction    Hip adduction    Hip internal rotation 8 8  Hip external rotation 40 40  Knee flexion    Knee extension    Ankle dorsiflexion    Ankle plantarflexion    Ankle inversion    Ankle eversion     (Blank rows = not tested)  LOWER EXTREMITY MMT:    Hips 4/5 bil Knees 4/5 bil  LUMBAR SPECIAL TESTS:    FUNCTIONAL TESTS:  Able to balance in SLS on Rt x10", on Lt x 3"  GAIT: Distance walked: short distance within clinic Assistive device utilized: None Level of assistance: Complete Independence Comments: gets quickly SOB  TODAY'S TREATMENT:                                                                                                                              DATE:  7/26: Rt SL STM elongation along Lt side of trunk thoracic paraspinals, obliques, QL, lumbar paraspinals, Lt gluteals (Pt needs 2 pillows for head and extra towels under Rt hip) Rt SL Lt UE OH and LE reach 3x10" for Lt trunk elongation Supine Lt hip flexor stretch off side of table 1x30" Supine LTR x30" each way with OH reach to enhance stretch Supine fig 4 x30" each Butterfly stretch supine 2x20" Seated in chair bil upper quadrant STM and passive pectoral stretching by PT pulling shoulders back in upright posture   03/31/23:  Discussed benefits of aquatic PT and gave aquatic info  handout - Pt willing to try Rt SL elongation STG to Lt QL, paraspinals, lat, bil SI joints and hips   PATIENT EDUCATION:  Education details: aquatic info Person educated: Patient Education method: Chief Technology [employer] Education comprehension: verbalized understanding  HOME EXERCISE PROGRAM: Aquatic info - willing to try Has land based stretches and postural strength with yellow tband from previous sessions  ASSESSMENT:  CLINICAL IMPRESSION: Patient returns for first time follow up.  STM techniques and stretching used for tissue elongation along Lt side of trunk and across bil upper quadrants.  She has signif adaptive shortening secondary to severe scoliosis.  She is able to perform beneficial supine stretches on mat table that she cannot seem to feel the same effects with on her bed at home and is not comfortable getting to floor.  She will continue to benefit from skilled PT  to address deficits and pain.  OBJECTIVE IMPAIRMENTS: cardiopulmonary status limiting activity, decreased activity tolerance, decreased balance, decreased endurance, decreased mobility, decreased ROM, decreased strength, impaired flexibility, improper body mechanics, postural dysfunction, and pain.   ACTIVITY LIMITATIONS: carrying, lifting, bending, standing, squatting, sleeping, stairs, transfers, bed mobility, continence, and locomotion level  PARTICIPATION LIMITATIONS: meal prep, cleaning, laundry, driving, shopping, and community activity  PERSONAL FACTORS: Age, Time since onset of injury/illness/exacerbation, and 1-2 comorbidities: scoliosis and cardiac/pulmonary history  are also affecting patient's functional outcome.   REHAB POTENTIAL: Good  CLINICAL DECISION MAKING: Stable/uncomplicated  EVALUATION COMPLEXITY: Low   GOALS: Goals reviewed with patient? Yes  SHORT TERM GOALS: Target date: 04/28/23  Pt will trial and participate in aquatic PT and be able to fully participate in sessions with breaks as needed for SOB. Baseline: Goal status: deferred for now secondary to Pt not interested in trying aquatics  2.  Pt will be able to perform an aquatic to demo improved endurance in buoyant environment. Baseline:  Goal status: deferred for now secondary to Pt not interested in trying aquatics  3.  Pt will report improved Lt flank pain by at least 25% most days of the week. Baseline:  Goal status: INITIAL    LONG TERM GOALS: Target date: 05/26/23  Pt will report improved pain level down to 3-4/10 at least several days of the week. Baseline:  Goal status: INITIAL  2.  Pt will be able to perform up to 15 min of standing household chores without exacerbation of symptoms Baseline:  Goal status: INITIAL  3.  Pt will be compliant and ind with HEP to help manage chronic pain. Baseline:  Goal status: INITIAL  4.  Pt will be able to participate in a with breaks as  needed for SOB to demo improved endurance. Baseline:  Goal status: INITIAL    PLAN:  PT FREQUENCY: 2x/week  PT DURATION: 8 weeks  PLANNED INTERVENTIONS: Therapeutic exercises, Therapeutic activity, Neuromuscular re-education, Balance training, Gait training, Patient/Family education, Self Care, Joint mobilization, Aquatic Therapy, Electrical stimulation, Spinal mobilization, Cryotherapy, Moist heat, and Manual therapy.  PLAN FOR NEXT SESSION:  aquatics - deferred for now secondary to Pt not interested in trying aquatics  Land-based: more focused on manual techniques: STM with some NuStep, gait endurance, postural strength as tol  Pt also doing pelvic floor PT at this facility   [employer], PT 04/24/23 11:57 AM

## 2023-04-24 NOTE — Patient Instructions (Signed)

## 2023-04-27 ENCOUNTER — Ambulatory Visit: Payer: Medicare Other | Admitting: Physical Therapy

## 2023-04-27 DIAGNOSIS — R293 Abnormal posture: Secondary | ICD-10-CM

## 2023-04-27 DIAGNOSIS — M6281 Muscle weakness (generalized): Secondary | ICD-10-CM | POA: Diagnosis not present

## 2023-04-27 DIAGNOSIS — R279 Unspecified lack of coordination: Secondary | ICD-10-CM

## 2023-04-27 NOTE — Therapy (Signed)
OUTPATIENT PHYSICAL THERAPY FEMALE PELVIC Treatment   Patient Name: Janet Nguyen MRN: [id number] DOB:[birth year], 89 y.o., female Today's Date: 04/27/2023  END OF SESSION:  PT End of Session - 04/27/23 1126     Visit Number 2   PT   Date for PT Re-Evaluation 07/15/23   pelvic   Authorization Type medicare - needs KX at visit 15    PT Start Time 1019    PT Stop Time 1100    PT Time Calculation (min) 41 min    Activity Tolerance Patient tolerated treatment well    Behavior During Therapy [facility] for tasks assessed/performed              Past Medical History:  Diagnosis Date   Arrhythmia    History of SVT with documented PVC'S and  PAC'S  12/08/12 Nuc stress test normal LV EF 74%  Event Monitor  12/01/12-01/03/13   Atrial flutter (HCC)    Celiac disease    treated by Dr. Kinnie Scales   GERD (gastroesophageal reflux disease)    Hypertension    Intervertebral disc stenosis of neural canal of cervical region    Irregular heart beat 11/30/2012   ECHO-EF 60-65%   Osteoporosis    PMR (polymyalgia rheumatica) (HCC)    Dr. Mallie Mussel; pt states she was diagnosed 10-15 years ago, not treated at this time or any issues that she is aware of.   Scoliosis    Scoliosis    Sleep apnea 10/02/11 [facility]   Sleep study AHI -total sleep 10.3/hr  64.0/ hr during REM sleep.RDI 22.8/hr during total sleep 64.0/hr during REM sleep The lowest O2 sat during Non-REM and REM sleep was 86% and 88% respectively. 04/08/12 CPAP/BIPAP titration study [facility]   Past Surgical History:  Procedure Laterality Date   APPENDECTOMY     ruptured at age 55 and had surgery   CARDIAC CATHETERIZATION  01/27/06   CARDIOVERSION N/A 01/08/2022   Procedure: CARDIOVERSION;  Surgeon: Meriam Sprague, MD;  Location: [facility] ENDOSCOPY;  Service: Cardiovascular;  Laterality: N/A;   cataract surgery  2015   Dr. Elmer Picker; March & April 2015   Patient Active Problem List   Diagnosis Date Noted    Secondary hypercoagulable state (HCC) 12/12/2021   Hyponatremia 12/04/2021   Acute CHF (congestive heart failure) (HCC) 12/02/2021   Acute on chronic diastolic CHF (congestive heart failure) (HCC) 12/02/2021   Atrial fibrillation with rapid ventricular response (HCC) 12/02/2021   Essential hypertension 12/02/2021   GERD without esophagitis 12/02/2021   Obstructive sleep apnea 12/02/2021   Interstitial lung disease (HCC) 12/02/2021   History of pulmonary embolism 12/02/2021   Pulmonary embolism (HCC) 11/06/2021   Bronchiectasis (HCC) 11/06/2021   Deviated septum 05/08/2020   Mass of subcutaneous tissue 05/02/2020   Vertigo 06/16/2019   Pulmonary hypertension, unspecified (HCC) 04/14/2019   Ischemic colitis (HCC) 04/14/2019   Migraine with aura and without status migrainosus, not intractable 11/08/2018   Degeneration of lumbar intervertebral disc 10/14/2018   Hoarseness of voice 03/04/2018   Abdominal pain 07/01/2017   Diverticulitis, colon    Metabolic acidosis, increased anion gap    Constipation 04/14/2017   Lumbar hernia 04/14/2017   Presbycusis of both ears 01/10/2017   Tinnitus aurium, bilateral 01/10/2017   Gastroesophageal reflux disease 08/20/2016   Hemoptysis 08/20/2016   Obstructive sleep apnea of adult 08/20/2016   Rhinitis, chronic 08/20/2016   Throat pain in adult 08/20/2016   Fatigue 12/23/2015   Sciatica of  right side 10/06/2015   History of migraine headaches 10/06/2015   Frequent PVCs 12/28/2013   Premature atrial contractions 12/28/2013   PSVT (paroxysmal supraventricular tachycardia) (HCC) 12/28/2013   Heart palpitations 07/13/2013   Sleep apnea 04/11/2013   Scoliosis 04/11/2013   Atypical atrial flutter (HCC) 11/29/2012   Chest pain, atypical 11/29/2012   Fibromyalgia syndrome 11/29/2012   Chronic steroid use 11/29/2012    PCP: [name], MD   REFERRING PROVIDER:    Randa Spike, FNP    REFERRING DIAG: R39.89 (ICD-10-CM) -  Other symptoms and signs involving the genitourinary system   THERAPY DIAG:  Muscle weakness (generalized)  Abnormal posture  Unspecified lack of coordination  Rationale for Evaluation and Treatment: Rehabilitation  ONSET DATE: started earlier this year  SUBJECTIVE:                                                                                                                                                                                           SUBJECTIVE STATEMENT: I had a lot of blood in my urine this weekend.  I am trying to get to see a urologist. Fluid intake: Yes: water    PAIN:  Are you having pain? Yes NPRS scale: x/10 - not really a pain it is itchy and inflamed and using the A&D and wipes Pain location: External  Pain type: burning Pain description: intermittent   Aggravating factors: just always irritated Relieving factors: not sure  PRECAUTIONS: None  RED FLAGS: None   WEIGHT BEARING RESTRICTIONS: No  FALLS:  Has patient fallen in last 6 months? No  LIVING ENVIRONMENT: Lives with: lives alone Lives in: House/apartment   OCCUPATION: retired - exercise walking with shopping cart  PLOF: Independent  PATIENT GOALS: have less leakage/urgency  PERTINENT HISTORY:  Scoliosis, lung and heart disease, 4 vaginal deliveries   BOWEL MOVEMENT: Pain with bowel movement: No Type of bowel movement:Type (Bristol Stool Scale) solid and normal usually, skinny, Frequency daily, and Strain No Fully empty rectum: Yes: I think so Leakage: Yes:   Pads: Yes: 1 at night and 1 morning unless something happens Fiber supplement: No  URINATION: Pain with urination: No Fully empty bladder: Yes: I don't know Stream:  not sure Urgency: Yes: more than once a week Frequency: sometimes every hour, sometimes 1-2/day; wakes up due to back and then I just go to the bathroom Leakage: Urge to void and Walking to the bathroom Pads: Yes: 1 in morning and 1 at  night   PREGNANCY: Vaginal deliveries 4   PROLAPSE:    OBJECTIVE:   DIAGNOSTIC FINDINGS:    PATIENT SURVEYS:  COGNITION: Overall cognitive status: Within functional limits for tasks assessed       GAIT:  Comments: wide BOS  POSTURE:  severe scoliosis  PELVIC ALIGNMENT:  LUMBARAROM/PROM: See ortho notes  LOWER EXTREMITY ROM:  See ortho notes  LOWER EXTREMITY MMT:  See ortho notes PALPATION:                  External Perineal Exam redness of vulva, small amount of stool                             Internal Pelvic Floor - not fully relax in between reps, hold for 2 seconds only, 5 quick flicks with diminishing strength  Patient confirms identification and approves PT to assess internal pelvic floor and treatment Yes  PELVIC MMT:   MMT eval  Vaginal   Internal Anal Sphincter 3/5  External Anal Sphincter 3/5  Puborectalis 3+/5  Diastasis Recti   (Blank rows = not tested)        TONE: Slightly lower  PROLAPSE: Not noted (did not do anterior pelvic assessment)  TODAY'S TREATMENT:                                                                                                                              DATE: 04/27/23  Theract Using b sure pads, peri bottle, toileting and bulging with TC while sitting on mat using squatty potty  NMRE: TC/VC to tighten anal sphincters with exhale Supine (did some in supine but pt is very uncomfortable) Side lying - hip external and internal rotation - clam and reverse clams   04/22/23  EVAL and toileting techniques as well as sample for vaginal moisturizers given today   PATIENT EDUCATION:  Education details: Access Code: [email or web address] Person educated: Patient Education method: Explanation and Handouts Education comprehension: verbalized understanding  HOME EXERCISE PROGRAM: Access Code: [email or web address] URL: [email or web address] Date: 04/27/2023 Prepared by: Dwana Curd  Exercises -  Clamshell  - 1 x daily - 7 x weekly - 3 sets - 10 reps - Beginner Reverse Clamshell  - 1 x daily - 7 x weekly - 3 sets - 10 reps  ASSESSMENT:  CLINICAL IMPRESSION: Pt was given initial HEP today.  Pt did well with cues to exhale when tightening and cues on abdomen to not bulge.  PT also reviewed toileting and correct leg position.  Pt at initial follow up from eval and no goals met today.   OBJECTIVE IMPAIRMENTS: decreased coordination, decreased endurance, decreased ROM, decreased strength, impaired tone, and postural dysfunction.   ACTIVITY LIMITATIONS: lifting, bending, standing, sleeping, continence, toileting, and locomotion level  PARTICIPATION LIMITATIONS: community activity  PERSONAL FACTORS: Age, Past/current experiences, and 1-2 comorbidities: 4 vaginal deliveries, scolosis  are also affecting patient's functional outcome.   REHAB POTENTIAL: Good  CLINICAL DECISION MAKING: Evolving/moderate complexity  EVALUATION COMPLEXITY: Moderate   GOALS: Goals reviewed with patient? Yes  SHORT  TERM GOALS: Target date: 05/20/23  Ind with initial HEP Baseline: Goal status: Ongoing  2.  Ind with urge and toileting techniques Baseline:  Goal status: Ongoing  LONG TERM GOALS: Target date: 07/15/23  Pt will report having feeling of when there is stool in the anal canal (can feel it coming out) Baseline:  Goal status: INITIAL  2.  Pt will be independent with advanced HEP to maintain improvements made throughout therapy  Baseline:  Goal status: INITIAL  3.  Pt will report less pain and itchiness of the vulva due to improved skin care Baseline:  Goal status: INITIAL  4.  Pt will be able to contract and hold pelvic floor for at least 10 seconds in order to reduce feeling of urgency. Baseline:  Goal status: INITIAL   PLAN:  PT FREQUENCY: 1x/week  PT DURATION: 12 weeks  PLANNED INTERVENTIONS: Therapeutic exercises, Therapeutic activity, Neuromuscular re-education,  Balance training, Gait training, Patient/Family education, Self Care, Joint mobilization, Dry Needling, Electrical stimulation, Cryotherapy, Moist heat, Taping, Traction, Biofeedback, Manual therapy, and Re-evaluation  PLAN FOR NEXT SESSION: follow up on initial HEP and see if any questions on using squatty potty, b sure pads or peri bottle.  Progress external anal sphincter strength (maybe RUSI), ask about getting urologist appointment   Junious Silk, PT 04/27/2023, 11:27 AM

## 2023-04-28 DIAGNOSIS — R310 Gross hematuria: Secondary | ICD-10-CM | POA: Diagnosis not present

## 2023-04-28 DIAGNOSIS — R8289 Other abnormal findings on cytological and histological examination of urine: Secondary | ICD-10-CM | POA: Diagnosis not present

## 2023-04-28 DIAGNOSIS — N3021 Other chronic cystitis with hematuria: Secondary | ICD-10-CM | POA: Diagnosis not present

## 2023-04-30 ENCOUNTER — Ambulatory Visit: Payer: Medicare Other

## 2023-05-05 ENCOUNTER — Ambulatory Visit: Payer: Medicare Other | Attending: Family Medicine | Admitting: Physical Therapy

## 2023-05-05 ENCOUNTER — Encounter: Payer: Self-pay | Admitting: Physical Therapy

## 2023-05-05 DIAGNOSIS — R279 Unspecified lack of coordination: Secondary | ICD-10-CM | POA: Insufficient documentation

## 2023-05-05 DIAGNOSIS — M6281 Muscle weakness (generalized): Secondary | ICD-10-CM | POA: Insufficient documentation

## 2023-05-05 DIAGNOSIS — M542 Cervicalgia: Secondary | ICD-10-CM | POA: Insufficient documentation

## 2023-05-05 DIAGNOSIS — R293 Abnormal posture: Secondary | ICD-10-CM | POA: Insufficient documentation

## 2023-05-05 DIAGNOSIS — M546 Pain in thoracic spine: Secondary | ICD-10-CM | POA: Insufficient documentation

## 2023-05-05 DIAGNOSIS — M4125 Other idiopathic scoliosis, thoracolumbar region: Secondary | ICD-10-CM | POA: Diagnosis not present

## 2023-05-05 NOTE — Therapy (Signed)
OUTPATIENT PHYSICAL THERAPY THORACOLUMBAR TREATMENT   Patient Name: Janet Nguyen MRN: [id number] DOB:[birth year], 89 y.o., female Today's Date: 05/05/2023  END OF SESSION:  PT End of Session - 05/05/23 1109     Visit Number 3   ortho   Date for PT Re-Evaluation 05/26/23    Authorization Type medicare - needs KX at visit 15    PT Start Time 1105    PT Stop Time 1145    PT Time Calculation (min) 40 min    Activity Tolerance Patient tolerated treatment well    Behavior During Therapy [facility] for tasks assessed/performed               Past Medical History:  Diagnosis Date   Arrhythmia    History of SVT with documented PVC'S and  PAC'S  12/08/12 Nuc stress test normal LV EF 74%  Event Monitor  12/01/12-01/03/13   Atrial flutter (HCC)    Celiac disease    treated by Dr. Kinnie Scales   GERD (gastroesophageal reflux disease)    Hypertension    Intervertebral disc stenosis of neural canal of cervical region    Irregular heart beat 11/30/2012   ECHO-EF 60-65%   Osteoporosis    PMR (polymyalgia rheumatica) (HCC)    Dr. Mallie Mussel; pt states she was diagnosed 10-15 years ago, not treated at this time or any issues that she is aware of.   Scoliosis    Scoliosis    Sleep apnea 10/02/11 [facility]   Sleep study AHI -total sleep 10.3/hr  64.0/ hr during REM sleep.RDI 22.8/hr during total sleep 64.0/hr during REM sleep The lowest O2 sat during Non-REM and REM sleep was 86% and 88% respectively. 04/08/12 CPAP/BIPAP titration study [facility]   Past Surgical History:  Procedure Laterality Date   APPENDECTOMY     ruptured at age 69 and had surgery   CARDIAC CATHETERIZATION  01/27/06   CARDIOVERSION N/A 01/08/2022   Procedure: CARDIOVERSION;  Surgeon: Meriam Sprague, MD;  Location: [facility] ENDOSCOPY;  Service: Cardiovascular;  Laterality: N/A;   cataract surgery  2015   Dr. Elmer Picker; March & April 2015   Patient Active Problem List   Diagnosis Date Noted    Secondary hypercoagulable state (HCC) 12/12/2021   Hyponatremia 12/04/2021   Acute CHF (congestive heart failure) (HCC) 12/02/2021   Acute on chronic diastolic CHF (congestive heart failure) (HCC) 12/02/2021   Atrial fibrillation with rapid ventricular response (HCC) 12/02/2021   Essential hypertension 12/02/2021   GERD without esophagitis 12/02/2021   Obstructive sleep apnea 12/02/2021   Interstitial lung disease (HCC) 12/02/2021   History of pulmonary embolism 12/02/2021   Pulmonary embolism (HCC) 11/06/2021   Bronchiectasis (HCC) 11/06/2021   Deviated septum 05/08/2020   Mass of subcutaneous tissue 05/02/2020   Vertigo 06/16/2019   Pulmonary hypertension, unspecified (HCC) 04/14/2019   Ischemic colitis (HCC) 04/14/2019   Migraine with aura and without status migrainosus, not intractable 11/08/2018   Degeneration of lumbar intervertebral disc 10/14/2018   Hoarseness of voice 03/04/2018   Abdominal pain 07/01/2017   Diverticulitis, colon    Metabolic acidosis, increased anion gap    Constipation 04/14/2017   Lumbar hernia 04/14/2017   Presbycusis of both ears 01/10/2017   Tinnitus aurium, bilateral 01/10/2017   Gastroesophageal reflux disease 08/20/2016   Hemoptysis 08/20/2016   Obstructive sleep apnea of adult 08/20/2016   Rhinitis, chronic 08/20/2016   Throat pain in adult 08/20/2016   Fatigue 12/23/2015   Sciatica of right side  10/06/2015   History of migraine headaches 10/06/2015   Frequent PVCs 12/28/2013   Premature atrial contractions 12/28/2013   PSVT (paroxysmal supraventricular tachycardia) (HCC) 12/28/2013   Heart palpitations 07/13/2013   Sleep apnea 04/11/2013   Scoliosis 04/11/2013   Atypical atrial flutter (HCC) 11/29/2012   Chest pain, atypical 11/29/2012   Fibromyalgia syndrome 11/29/2012   Chronic steroid use 11/29/2012    PCP: [name], MD  REFERRING PROVIDER: [name], MD  REFERRING DIAG: M41.9 (ICD-10-CM) - Scoliosis,  unspecified  Rationale for Evaluation and Treatment: Rehabilitation  THERAPY DIAG:  Muscle weakness (generalized)  Abnormal posture  Unspecified lack of coordination  Other idiopathic scoliosis, thoracolumbar region  ONSET DATE: chronic  SUBJECTIVE:                                                                                                                                                                                           SUBJECTIVE STATEMENT: I am late, stressed and out of breath.  I've been going since 8am this morning.    PERTINENT HISTORY:  Severe scoliosis Pulmonary HTN Cardioversion 12/2021 osteoporosis Depression Lives alone, has been treated at this facility many times    PAIN:  PAIN:  Are you having pain? Yes NPRS scale: 6-7/10 Pain location: left flank Pain orientation: Left  PAIN TYPE: aching, dull, and tight Pain description: constant  Aggravating factors: overeating (no room for my stomach), walking brings me SOB Relieving factors: Tylenol but always constant pain   PRECAUTIONS: Other: low bone density  WEIGHT BEARING RESTRICTIONS: No  FALLS:  Has patient fallen in last 6 months? No  LIVING ENVIRONMENT: Lives with: lives alone Lives in: House/apartment Stairs: No Has following equipment at home: None  OCCUPATION: retired  PLOF: Independent  PATIENT GOALS: reduce pain, walk with less SOB, improve flexibility  NEXT MD VISIT: as needed  OBJECTIVE:   DIAGNOSTIC FINDINGS:  EXAM: 2024 DG HIP (WITH OR WITHOUT PELVIS) 2-3V RIGHT   COMPARISON:  None Available.   FINDINGS: There is no evidence of hip fracture or dislocation. There is no evidence of arthropathy or other focal bone abnormality.   IMPRESSION: Negative.  IMPRESSION:  2014  1. Multilevel slight degenerative disc disease with no neural  impingement.  2. Fairly severe bilateral facet arthritis and foraminal stenosis  at T10-11.  3.  Multilevel degenerative facet  arthritis.    IMPRESSION 2014: The patient has severe degenerative disc and joint  disease at multiple levels of the lumbar spine, essentially  unchanged since the prior CT scan dated 01/29/2011.  Most severe  neural impingement is at L2-3 where there is moderately severe  spinal stenosis.  There is chronic encroachment on the right  lateral recess at L3-4 and on the left lateral recess at L1-2.  PATIENT SURVEYS:  Modified Oswestry 23/50, moderate disability   SCREENING FOR RED FLAGS: Bowel or bladder incontinence: Yes: bowel >bladder Spinal tumors: No Cauda equina syndrome: No Compression fracture: No Abdominal aneurysm: No  COGNITION: Overall cognitive status: Within functional limits for tasks assessed     SENSATION: WFL  MUSCLE LENGTH: Hamstrings: Right 65 deg; Left 65 deg Signif adaptive shortening of Lt QL, lat, paraspinals and obliques along Lt trunk secondary to scoliotic curve  POSTURE:  significant scoliosis with Lt ilium in contact with lower ribcage  PALPATION: Tender and tight along Lt QL, lat, paraspinals and obliques Tender bil SI joints, gluteals  LUMBAR ROM:   AROM eval  Flexion Fingers to ankles, pain  Extension NT  Right lateral flexion 5, pain  Left lateral flexion 5, pain  Right rotation 50%  Left rotation 50%   (Blank rows = not tested)  LOWER EXTREMITY ROM:     Passive  Right eval Left eval  Hip flexion    Hip extension    Hip abduction    Hip adduction    Hip internal rotation 8 8  Hip external rotation 40 40  Knee flexion    Knee extension    Ankle dorsiflexion    Ankle plantarflexion    Ankle inversion    Ankle eversion     (Blank rows = not tested)  LOWER EXTREMITY MMT:   Hips 4/5 bil Knees 4/5 bil  LUMBAR SPECIAL TESTS:    FUNCTIONAL TESTS:  Able to balance in SLS on Rt x10", on Lt x 3"  GAIT: Distance walked: short distance within clinic Assistive device utilized: None Level of assistance: Complete  Independence Comments: gets quickly SOB  TODAY'S TREATMENT:                                                                                                                              DATE:  8/6: Seated in chair bil upper quadrant STM and passive pectoral stretching by PT pulling shoulders back in upright posture  Rt SL STM elongation along Lt side of trunk thoracic paraspinals, obliques, QL, lumbar paraspinals, Lt gluteals (Pt needs 2 pillows for head and extra towels under Rt hip) Rt SL Lt UE OH and LE reach 3x10" for Lt trunk elongation Supine Lt hip flexor stretch off side of table 1x30" Supine LTR x30" each way with OH reach to enhance stretch Supine fig 4 x30" each Butterfly stretch supine 2x20"   7/26: Rt SL STM elongation along Lt side of trunk thoracic paraspinals, obliques, QL, lumbar paraspinals, Lt gluteals (Pt needs 2 pillows for head and extra towels under Rt hip) Rt SL Lt UE OH and LE reach 3x10" for Lt trunk elongation Supine Lt hip flexor stretch off side of table 1x30" Supine LTR x30" each way with OH reach  to enhance stretch Supine fig 4 x30" each Butterfly stretch supine 2x20" Seated in chair bil upper quadrant STM and passive pectoral stretching by PT pulling shoulders back in upright posture   03/31/23:  Discussed benefits of aquatic PT and gave aquatic info handout - Pt willing to try Rt SL elongation STG to Lt QL, paraspinals, lat, bil SI joints and hips   PATIENT EDUCATION:  Education details: Dance movement psychotherapist Person educated: Patient Education method: Chief Technology [employer] Education comprehension: verbalized understanding  HOME EXERCISE PROGRAM: Aquatic info - willing to try Has land based stretches and postural strength with yellow tband from previous sessions  ASSESSMENT:  CLINICAL IMPRESSION: Pt arrived rushing and SOB.  STM techniques and stretching used for tissue elongation along Lt side of trunk and across bil upper quadrants.  Pt has signif  adaptive shortening secondary to severe scoliosis.  She is able to perform beneficial supine stretches on mat table that she cannot seem to feel the same effects with on her bed at home and is not comfortable getting to floor.  She will continue to benefit from skilled PT to address deficits and pain.  OBJECTIVE IMPAIRMENTS: cardiopulmonary status limiting activity, decreased activity tolerance, decreased balance, decreased endurance, decreased mobility, decreased ROM, decreased strength, impaired flexibility, improper body mechanics, postural dysfunction, and pain.   ACTIVITY LIMITATIONS: carrying, lifting, bending, standing, squatting, sleeping, stairs, transfers, bed mobility, continence, and locomotion level  PARTICIPATION LIMITATIONS: meal prep, cleaning, laundry, driving, shopping, and community activity  PERSONAL FACTORS: Age, Time since onset of injury/illness/exacerbation, and 1-2 comorbidities: scoliosis and cardiac/pulmonary history  are also affecting patient's functional outcome.   REHAB POTENTIAL: Good  CLINICAL DECISION MAKING: Stable/uncomplicated  EVALUATION COMPLEXITY: Low   GOALS: Goals reviewed with patient? Yes  SHORT TERM GOALS: Target date: 04/28/23  Pt will trial and participate in aquatic PT and be able to fully participate in sessions with breaks as needed for SOB. Baseline: Goal status: deferred for now secondary to Pt not interested in trying aquatics  2.  Pt will be able to perform an aquatic to demo improved endurance in buoyant environment. Baseline:  Goal status: deferred for now secondary to Pt not interested in trying aquatics  3.  Pt will report improved Lt flank pain by at least 25% most days of the week. Baseline:  Goal status: INITIAL    LONG TERM GOALS: Target date: 05/26/23  Pt will report improved pain level down to 3-4/10 at least several days of the week. Baseline:  Goal status: INITIAL  2.  Pt will be able to perform up to 15  min of standing household chores without exacerbation of symptoms Baseline:  Goal status: INITIAL  3.  Pt will be compliant and ind with HEP to help manage chronic pain. Baseline:  Goal status: INITIAL  4.  Pt will be able to participate in a with breaks as needed for SOB to demo improved endurance. Baseline:  Goal status: INITIAL    PLAN:  PT FREQUENCY: 2x/week  PT DURATION: 8 weeks  PLANNED INTERVENTIONS: Therapeutic exercises, Therapeutic activity, Neuromuscular re-education, Balance training, Gait training, Patient/Family education, Self Care, Joint mobilization, Aquatic Therapy, Electrical stimulation, Spinal mobilization, Cryotherapy, Moist heat, and Manual therapy.  PLAN FOR NEXT SESSION:  aquatics - deferred for now secondary to Pt not interested in trying aquatics  Land-based: more focused on manual techniques: STM with some NuStep, gait endurance, postural strength as tol  Pt also doing pelvic floor PT at this facility  [employer]  Saintclair Schroader, PT 05/05/23 11:43 AM

## 2023-05-06 ENCOUNTER — Ambulatory Visit: Payer: Medicare Other

## 2023-05-06 DIAGNOSIS — M6281 Muscle weakness (generalized): Secondary | ICD-10-CM | POA: Diagnosis not present

## 2023-05-06 DIAGNOSIS — R279 Unspecified lack of coordination: Secondary | ICD-10-CM

## 2023-05-06 DIAGNOSIS — R293 Abnormal posture: Secondary | ICD-10-CM

## 2023-05-06 DIAGNOSIS — M4125 Other idiopathic scoliosis, thoracolumbar region: Secondary | ICD-10-CM | POA: Diagnosis not present

## 2023-05-06 DIAGNOSIS — M542 Cervicalgia: Secondary | ICD-10-CM | POA: Diagnosis not present

## 2023-05-06 DIAGNOSIS — M546 Pain in thoracic spine: Secondary | ICD-10-CM | POA: Diagnosis not present

## 2023-05-06 NOTE — Therapy (Signed)
OUTPATIENT PHYSICAL THERAPY FEMALE PELVIC Treatment   Patient Name: Janet Nguyen MRN: [id number] DOB:[birth year], 89 y.o., female Today's Date: 05/06/2023  END OF SESSION:  PT End of Session - 05/06/23 1105     Visit Number 4   pelvic floor   Date for PT Re-Evaluation 05/26/23    Authorization Type medicare - needs KX at visit 15    Progress Note Due on Visit 10    PT Start Time 1100    PT Stop Time 1140    PT Time Calculation (min) 40 min    Activity Tolerance Patient tolerated treatment well    Behavior During Therapy [facility] for tasks assessed/performed               Past Medical History:  Diagnosis Date   Arrhythmia    History of SVT with documented PVC'S and  PAC'S  12/08/12 Nuc stress test normal LV EF 74%  Event Monitor  12/01/12-01/03/13   Atrial flutter (HCC)    Celiac disease    treated by Dr. Kinnie Scales   GERD (gastroesophageal reflux disease)    Hypertension    Intervertebral disc stenosis of neural canal of cervical region    Irregular heart beat 11/30/2012   ECHO-EF 60-65%   Osteoporosis    PMR (polymyalgia rheumatica) (HCC)    Dr. Mallie Mussel; pt states she was diagnosed 10-15 years ago, not treated at this time or any issues that she is aware of.   Scoliosis    Scoliosis    Sleep apnea 10/02/11 [facility]   Sleep study AHI -total sleep 10.3/hr  64.0/ hr during REM sleep.RDI 22.8/hr during total sleep 64.0/hr during REM sleep The lowest O2 sat during Non-REM and REM sleep was 86% and 88% respectively. 04/08/12 CPAP/BIPAP titration study [facility]   Past Surgical History:  Procedure Laterality Date   APPENDECTOMY     ruptured at age 49 and had surgery   CARDIAC CATHETERIZATION  01/27/06   CARDIOVERSION N/A 01/08/2022   Procedure: CARDIOVERSION;  Surgeon: Meriam Sprague, MD;  Location: [facility] ENDOSCOPY;  Service: Cardiovascular;  Laterality: N/A;   cataract surgery  2015   Dr. Elmer Picker; March & April 2015   Patient Active  Problem List   Diagnosis Date Noted   Secondary hypercoagulable state (HCC) 12/12/2021   Hyponatremia 12/04/2021   Acute CHF (congestive heart failure) (HCC) 12/02/2021   Acute on chronic diastolic CHF (congestive heart failure) (HCC) 12/02/2021   Atrial fibrillation with rapid ventricular response (HCC) 12/02/2021   Essential hypertension 12/02/2021   GERD without esophagitis 12/02/2021   Obstructive sleep apnea 12/02/2021   Interstitial lung disease (HCC) 12/02/2021   History of pulmonary embolism 12/02/2021   Pulmonary embolism (HCC) 11/06/2021   Bronchiectasis (HCC) 11/06/2021   Deviated septum 05/08/2020   Mass of subcutaneous tissue 05/02/2020   Vertigo 06/16/2019   Pulmonary hypertension, unspecified (HCC) 04/14/2019   Ischemic colitis (HCC) 04/14/2019   Migraine with aura and without status migrainosus, not intractable 11/08/2018   Degeneration of lumbar intervertebral disc 10/14/2018   Hoarseness of voice 03/04/2018   Abdominal pain 07/01/2017   Diverticulitis, colon    Metabolic acidosis, increased anion gap    Constipation 04/14/2017   Lumbar hernia 04/14/2017   Presbycusis of both ears 01/10/2017   Tinnitus aurium, bilateral 01/10/2017   Gastroesophageal reflux disease 08/20/2016   Hemoptysis 08/20/2016   Obstructive sleep apnea of adult 08/20/2016   Rhinitis, chronic 08/20/2016   Throat pain in adult  08/20/2016   Fatigue 12/23/2015   Sciatica of right side 10/06/2015   History of migraine headaches 10/06/2015   Frequent PVCs 12/28/2013   Premature atrial contractions 12/28/2013   PSVT (paroxysmal supraventricular tachycardia) (HCC) 12/28/2013   Heart palpitations 07/13/2013   Sleep apnea 04/11/2013   Scoliosis 04/11/2013   Atypical atrial flutter (HCC) 11/29/2012   Chest pain, atypical 11/29/2012   Fibromyalgia syndrome 11/29/2012   Chronic steroid use 11/29/2012    PCP: [name], MD   REFERRING PROVIDER:    Randa Spike, FNP     REFERRING DIAG: R39.89 (ICD-10-CM) - Other symptoms and signs involving the genitourinary system   THERAPY DIAG:  Muscle weakness (generalized)  Abnormal posture  Unspecified lack of coordination  Other idiopathic scoliosis, thoracolumbar region  Rationale for Evaluation and Treatment: Rehabilitation  ONSET DATE: started earlier this year  SUBJECTIVE:                                                                                                                                                                                           SUBJECTIVE STATEMENT: Pt states that her bowel is worse than her bladder. She has been trying to work on [employer]. She has had a very difficult week with cervical bleeding. She has been to see MD and was diagnosed with cervical cancer. She is feeling a lot of vaginal pressure and discomfort. She is now taking antibiotics that further irritate bowel and cause more episodes of FI. She will be ok for days and then have episode of fecal incontinence. She feels like her stool is the appropriate consistency. She denies any constipation.   PAIN:  Are you having pain? Yes NPRS scale: x/10 - not really a pain it is itchy and inflamed and using the A&D and wipes Pain location: External  Pain type: burning Pain description: intermittent   Aggravating factors: just always irritated Relieving factors: not sure  PRECAUTIONS: None  RED FLAGS: None   WEIGHT BEARING RESTRICTIONS: No  FALLS:  Has patient fallen in last 6 months? No  LIVING ENVIRONMENT: Lives with: lives alone Lives in: House/apartment   OCCUPATION: retired - exercise walking with shopping cart  PLOF: Independent  PATIENT GOALS: have less leakage/urgency  PERTINENT HISTORY:  Scoliosis, lung and heart disease, 4 vaginal deliveries   BOWEL MOVEMENT: Pain with bowel movement: No Type of bowel movement:Type (Bristol Stool Scale) solid and normal usually, skinny, Frequency daily,  and Strain No Fully empty rectum: Yes: I think so Leakage: Yes:   Pads: Yes: 1 at night and 1 morning unless something happens Fiber supplement: No  URINATION: Pain with urination:  No Fully empty bladder: Yes: I don't know Stream:  not sure Urgency: Yes: more than once a week Frequency: sometimes every hour, sometimes 1-2/day; wakes up due to back and then I just go to the bathroom Leakage: Urge to void and Walking to the bathroom Pads: Yes: 1 in morning and 1 at night   PREGNANCY: Vaginal deliveries 4   PROLAPSE:    OBJECTIVE:   DIAGNOSTIC FINDINGS:    PATIENT SURVEYS:    COGNITION: Overall cognitive status: Within functional limits for tasks assessed       GAIT:  Comments: wide BOS  POSTURE:  severe scoliosis  PELVIC ALIGNMENT:  LUMBARAROM/PROM: See ortho notes  LOWER EXTREMITY ROM:  See ortho notes  LOWER EXTREMITY MMT:  See ortho notes PALPATION:                  External Perineal Exam redness of vulva, small amount of stool                             Internal Pelvic Floor - not fully relax in between reps, hold for 2 seconds only, 5 quick flicks with diminishing strength  Patient confirms identification and approves PT to assess internal pelvic floor and treatment Yes  PELVIC MMT:   MMT eval  Vaginal   Internal Anal Sphincter 3/5  External Anal Sphincter 3/5  Puborectalis 3+/5  Diastasis Recti   (Blank rows = not tested)        TONE: Slightly lower  PROLAPSE: Not noted (did not do anterior pelvic assessment)  TODAY'S TREATMENT:                                                                                                                              DATE:  05/06/23 Neuromuscular re-education: Pelvic floor muscle contractions - practice 10 second holds in different positions  Exercises: Seated hip adduction 2 x 10 ball squeeze Seated hip abduction 2 x 10 yellow band Seated march 2 x 10 Therapeutic activities: Squatty potty  problem solving Making sure she is completely emptying bowel Balloon breathing to help empty the bowel, no contractions to help bowel movement come down  04/27/23  Theract Using b sure pads, peri bottle, toileting and bulging with TC while sitting on mat using squatty potty  NMRE: TC/VC to tighten anal sphincters with exhale Supine (did some in supine but pt is very uncomfortable) Side lying - hip external and internal rotation - clam and reverse clams   04/22/23  EVAL and toileting techniques as well as sample for vaginal moisturizers given today   PATIENT EDUCATION:  Education details: Access Code: [email or web address] Person educated: Patient Education method: Explanation and Handouts Education comprehension: verbalized understanding  HOME EXERCISE PROGRAM: Access Code: [email or web address] URL: [email or web address] Date: 04/27/2023 Prepared by: Dwana Curd  Exercises - Clamshell  - 1 x daily - 7 x weekly - 3 sets - 10 reps -  Beginner Reverse Clamshell  - 1 x daily - 7 x weekly - 3 sets - 10 reps  ASSESSMENT:  CLINICAL IMPRESSION: Pt had difficulty with squatty potty and falling too far in the toilet when using so she returned it; we discussed other lower options. We also practiced balloon breathing in order to help with complete evacuation; she was instructed to do this instead of perofrming pelvic floor contractions on the toilet in order to help more stool come out. She was not comfortable performing side lying exercises, so we modified to seated exercises that she can also perform supine if she is having pain with sitting. She did very well with breath coordination during these exercises. She is agreeable to progressing pelvic floor contractions to different positions and doing long holds. She will continue to benefit from skilled physical therapy intervention in order to reduce fecal incontinence and completely empty bowels.   OBJECTIVE IMPAIRMENTS: decreased  coordination, decreased endurance, decreased ROM, decreased strength, impaired tone, and postural dysfunction.   ACTIVITY LIMITATIONS: lifting, bending, standing, sleeping, continence, toileting, and locomotion level  PARTICIPATION LIMITATIONS: community activity  PERSONAL FACTORS: Age, Past/current experiences, and 1-2 comorbidities: 4 vaginal deliveries, scolosis  are also affecting patient's functional outcome.   REHAB POTENTIAL: Good  CLINICAL DECISION MAKING: Evolving/moderate complexity  EVALUATION COMPLEXITY: Moderate   GOALS: Goals reviewed with patient? Yes  SHORT TERM GOALS: Target date: 05/20/23 - updated 05/06/23  Ind with initial HEP Baseline: Goal status: MET 05/06/23  2.  Ind with urge and toileting techniques Baseline:  Goal status: IN PROGRESS  LONG TERM GOALS: Target date: 07/15/23 - updated 05/06/23  Pt will report having feeling of when there is stool in the anal canal (can feel it coming out) Baseline:  Goal status: IN PROGRESS  2.  Pt will be independent with advanced HEP to maintain improvements made throughout therapy  Baseline:  Goal status: IN PROGRESS  3.  Pt will report less pain and itchiness of the vulva due to improved skin care Baseline:  Goal status: IN PROGRESS  4.  Pt will be able to contract and hold pelvic floor for at least 10 seconds in order to reduce feeling of urgency. Baseline:  Goal status: IN PROGRESS   PLAN:  PT FREQUENCY: 1x/week  PT DURATION: 12 weeks  PLANNED INTERVENTIONS: Therapeutic exercises, Therapeutic activity, Neuromuscular re-education, Balance training, Gait training, Patient/Family education, Self Care, Joint mobilization, Dry Needling, Electrical stimulation, Cryotherapy, Moist heat, Taping, Traction, Biofeedback, Manual therapy, and Re-evaluation  PLAN FOR NEXT SESSION: follow up on initial HEP and see if any questions on using squatty potty, b sure pads or peri bottle.  Progress external anal sphincter  strength (maybe RUSI), ask about getting urologist appointment   Julio Alm, PT, DPT08/03/2410:50 AM

## 2023-05-12 ENCOUNTER — Ambulatory Visit: Payer: Medicare Other | Admitting: Physical Therapy

## 2023-05-18 ENCOUNTER — Ambulatory Visit: Payer: Medicare Other | Admitting: Physical Therapy

## 2023-05-18 ENCOUNTER — Encounter: Payer: Self-pay | Admitting: Physical Therapy

## 2023-05-18 DIAGNOSIS — M542 Cervicalgia: Secondary | ICD-10-CM

## 2023-05-18 DIAGNOSIS — R279 Unspecified lack of coordination: Secondary | ICD-10-CM

## 2023-05-18 DIAGNOSIS — M6281 Muscle weakness (generalized): Secondary | ICD-10-CM | POA: Diagnosis not present

## 2023-05-18 DIAGNOSIS — M4125 Other idiopathic scoliosis, thoracolumbar region: Secondary | ICD-10-CM | POA: Diagnosis not present

## 2023-05-18 DIAGNOSIS — M546 Pain in thoracic spine: Secondary | ICD-10-CM | POA: Diagnosis not present

## 2023-05-18 DIAGNOSIS — R293 Abnormal posture: Secondary | ICD-10-CM | POA: Diagnosis not present

## 2023-05-18 NOTE — Therapy (Signed)
OUTPATIENT PHYSICAL THERAPY THORACOLUMBAR TREATMENT   Patient Name: Janet Nguyen MRN: [id number] DOB:1932-06-29, 89 y.o., female Today's Date: 05/18/2023  END OF SESSION:  PT End of Session - 05/18/23 1108     Visit Number 4    Date for PT Re-Evaluation 05/26/23    Authorization Type medicare - needs KX at visit 15    PT Start Time 1102    PT Stop Time 1145    PT Time Calculation (min) 43 min    Activity Tolerance Patient tolerated treatment well    Behavior During Therapy [facility] for tasks assessed/performed                Past Medical History:  Diagnosis Date   Arrhythmia    History of SVT with documented PVC'S and  PAC'S  12/08/12 Nuc stress test normal LV EF 74%  Event Monitor  12/01/12-01/03/13   Atrial flutter (HCC)    Celiac disease    treated by Dr. Kinnie Scales   GERD (gastroesophageal reflux disease)    Hypertension    Intervertebral disc stenosis of neural canal of cervical region    Irregular heart beat 11/30/2012   ECHO-EF 60-65%   Osteoporosis    PMR (polymyalgia rheumatica) (HCC)    Dr. Mallie Mussel; pt states she was diagnosed 10-15 years ago, not treated at this time or any issues that she is aware of.   Scoliosis    Scoliosis    Sleep apnea 10/02/11 [facility]   Sleep study AHI -total sleep 10.3/hr  64.0/ hr during REM sleep.RDI 22.8/hr during total sleep 64.0/hr during REM sleep The lowest O2 sat during Non-REM and REM sleep was 86% and 88% respectively. 04/08/12 CPAP/BIPAP titration study [facility]   Past Surgical History:  Procedure Laterality Date   APPENDECTOMY     ruptured at age 83 and had surgery   CARDIAC CATHETERIZATION  01/27/06   CARDIOVERSION N/A 01/08/2022   Procedure: CARDIOVERSION;  Surgeon: Meriam Sprague, MD;  Location: [facility] ENDOSCOPY;  Service: Cardiovascular;  Laterality: N/A;   cataract surgery  2015   Dr. Elmer Picker; March & April 2015   Patient Active Problem List   Diagnosis Date Noted    Secondary hypercoagulable state (HCC) 12/12/2021   Hyponatremia 12/04/2021   Acute CHF (congestive heart failure) (HCC) 12/02/2021   Acute on chronic diastolic CHF (congestive heart failure) (HCC) 12/02/2021   Atrial fibrillation with rapid ventricular response (HCC) 12/02/2021   Essential hypertension 12/02/2021   GERD without esophagitis 12/02/2021   Obstructive sleep apnea 12/02/2021   Interstitial lung disease (HCC) 12/02/2021   History of pulmonary embolism 12/02/2021   Pulmonary embolism (HCC) 11/06/2021   Bronchiectasis (HCC) 11/06/2021   Deviated septum 05/08/2020   Mass of subcutaneous tissue 05/02/2020   Vertigo 06/16/2019   Pulmonary hypertension, unspecified (HCC) 04/14/2019   Ischemic colitis (HCC) 04/14/2019   Migraine with aura and without status migrainosus, not intractable 11/08/2018   Degeneration of lumbar intervertebral disc 10/14/2018   Hoarseness of voice 03/04/2018   Abdominal pain 07/01/2017   Diverticulitis, colon    Metabolic acidosis, increased anion gap    Constipation 04/14/2017   Lumbar hernia 04/14/2017   Presbycusis of both ears 01/10/2017   Tinnitus aurium, bilateral 01/10/2017   Gastroesophageal reflux disease 08/20/2016   Hemoptysis 08/20/2016   Obstructive sleep apnea of adult 08/20/2016   Rhinitis, chronic 08/20/2016   Throat pain in adult 08/20/2016   Fatigue 12/23/2015   Sciatica of right side 10/06/2015  History of migraine headaches 10/06/2015   Frequent PVCs 12/28/2013   Premature atrial contractions 12/28/2013   PSVT (paroxysmal supraventricular tachycardia) (HCC) 12/28/2013   Heart palpitations 07/13/2013   Sleep apnea 04/11/2013   Scoliosis 04/11/2013   Atypical atrial flutter (HCC) 11/29/2012   Chest pain, atypical 11/29/2012   Fibromyalgia syndrome 11/29/2012   Chronic steroid use 11/29/2012    PCP: [name], MD  REFERRING PROVIDER: [name], MD  REFERRING DIAG: M41.9 (ICD-10-CM) - Scoliosis,  unspecified  Rationale for Evaluation and Treatment: Rehabilitation  THERAPY DIAG:  Muscle weakness (generalized)  Abnormal posture  Unspecified lack of coordination  Other idiopathic scoliosis, thoracolumbar region  Cervicalgia  ONSET DATE: chronic  SUBJECTIVE:                                                                                                                                                                                           SUBJECTIVE STATEMENT: I am feeling off today.  My hips and back are hurting.    PERTINENT HISTORY:  Severe scoliosis Pulmonary HTN Cardioversion 12/2021 osteoporosis Depression Lives alone, has been treated at this facility many times    PAIN:  PAIN:  Are you having pain? Yes NPRS scale: 6-7/10 Pain location: left flank Pain orientation: Left  PAIN TYPE: aching, dull, and tight Pain description: constant  Aggravating factors: overeating (no room for my stomach), walking brings me SOB Relieving factors: Tylenol but always constant pain   PRECAUTIONS: Other: low bone density  WEIGHT BEARING RESTRICTIONS: No  FALLS:  Has patient fallen in last 6 months? No  LIVING ENVIRONMENT: Lives with: lives alone Lives in: House/apartment Stairs: No Has following equipment at home: None  OCCUPATION: retired  PLOF: Independent  PATIENT GOALS: reduce pain, walk with less SOB, improve flexibility  NEXT MD VISIT: as needed  OBJECTIVE:   DIAGNOSTIC FINDINGS:  EXAM: 2024 DG HIP (WITH OR WITHOUT PELVIS) 2-3V RIGHT   COMPARISON:  None Available.   FINDINGS: There is no evidence of hip fracture or dislocation. There is no evidence of arthropathy or other focal bone abnormality.   IMPRESSION: Negative.  IMPRESSION:  2014  1. Multilevel slight degenerative disc disease with no neural  impingement.  2. Fairly severe bilateral facet arthritis and foraminal stenosis  at T10-11.  3.  Multilevel degenerative facet  arthritis.    IMPRESSION 2014: The patient has severe degenerative disc and joint  disease at multiple levels of the lumbar spine, essentially  unchanged since the prior CT scan dated 01/29/2011.  Most severe  neural impingement is at L2-3 where there is moderately severe  spinal stenosis.  There  is chronic encroachment on the right  lateral recess at L3-4 and on the left lateral recess at L1-2.  PATIENT SURVEYS:  Modified Oswestry 23/50, moderate disability   SCREENING FOR RED FLAGS: Bowel or bladder incontinence: Yes: bowel >bladder Spinal tumors: No Cauda equina syndrome: No Compression fracture: No Abdominal aneurysm: No  COGNITION: Overall cognitive status: Within functional limits for tasks assessed     SENSATION: WFL  MUSCLE LENGTH: Hamstrings: Right 65 deg; Left 65 deg Signif adaptive shortening of Lt QL, lat, paraspinals and obliques along Lt trunk secondary to scoliotic curve  POSTURE:  significant scoliosis with Lt ilium in contact with lower ribcage  PALPATION: Tender and tight along Lt QL, lat, paraspinals and obliques Tender bil SI joints, gluteals  LUMBAR ROM:   AROM eval  Flexion Fingers to ankles, pain  Extension NT  Right lateral flexion 5, pain  Left lateral flexion 5, pain  Right rotation 50%  Left rotation 50%   (Blank rows = not tested)  LOWER EXTREMITY ROM:     Passive  Right eval Left eval  Hip flexion    Hip extension    Hip abduction    Hip adduction    Hip internal rotation 8 8  Hip external rotation 40 40  Knee flexion    Knee extension    Ankle dorsiflexion    Ankle plantarflexion    Ankle inversion    Ankle eversion     (Blank rows = not tested)  LOWER EXTREMITY MMT:   Hips 4/5 bil Knees 4/5 bil  LUMBAR SPECIAL TESTS:    FUNCTIONAL TESTS:  Able to balance in SLS on Rt x10", on Lt x 3"  GAIT: Distance walked: short distance within clinic Assistive device utilized: None Level of assistance: Complete  Independence Comments: gets quickly SOB  TODAY'S TREATMENT:                                                                                                                              DATE:  8/19: Rt SL STM elongation along Lt side of trunk thoracic paraspinals, obliques, QL, lumbar paraspinals, Lt gluteals (Pt needs 2 pillows for head and extra towels under Rt hip) Supine LTR x30" each way with OH reach to enhance stretch Supine fig 4 x30" each Butterfly stretch supine 2x20" Windshield wipers x10 SKTC/DKTC x10 each Bridge x10 Seated in chair bil upper quadrant STM and passive pectoral stretching by PT pulling shoulders back in upright posture  Discussion about getting a rollator to allow for neighborhood ambulation with built in seat for SOB  8/6: Seated in chair bil upper quadrant STM and passive pectoral stretching by PT pulling shoulders back in upright posture  Rt SL STM elongation along Lt side of trunk thoracic paraspinals, obliques, QL, lumbar paraspinals, Lt gluteals (Pt needs 2 pillows for head and extra towels under Rt hip) Rt SL Lt UE OH and LE reach 3x10" for Lt trunk elongation Supine Lt  hip flexor stretch off side of table 1x30" Supine LTR x30" each way with OH reach to enhance stretch Supine fig 4 x30" each Butterfly stretch supine 2x20"   7/26: Rt SL STM elongation along Lt side of trunk thoracic paraspinals, obliques, QL, lumbar paraspinals, Lt gluteals (Pt needs 2 pillows for head and extra towels under Rt hip) Rt SL Lt UE OH and LE reach 3x10" for Lt trunk elongation Supine Lt hip flexor stretch off side of table 1x30" Supine LTR x30" each way with OH reach to enhance stretch Supine fig 4 x30" each Butterfly stretch supine 2x20" Seated in chair bil upper quadrant STM and passive pectoral stretching by PT pulling shoulders back in upright posture   03/31/23:  Discussed benefits of aquatic PT and gave aquatic info handout - Pt willing to try Rt SL elongation STG  to Lt QL, paraspinals, lat, bil SI joints and hips   PATIENT EDUCATION:  Education details: Dance movement psychotherapist Person educated: Patient Education method: Chief Technology [employer] Education comprehension: verbalized understanding  HOME EXERCISE PROGRAM: Aquatic info - willing to try Has land based stretches and postural strength with yellow tband from previous sessions  ASSESSMENT:  CLINICAL IMPRESSION: Pt with improving soft tissue tension.  She continues to be very limited in activity tolerance.  She admits she is down emotionally and lacks social contact.  We discussed her needing to follow up on getting a rollator to allow for more walking/community outings with option to sit with SOB onset.  Pt will continue to benefit from skilled PT to address mobility, pain and strength.  OBJECTIVE IMPAIRMENTS: cardiopulmonary status limiting activity, decreased activity tolerance, decreased balance, decreased endurance, decreased mobility, decreased ROM, decreased strength, impaired flexibility, improper body mechanics, postural dysfunction, and pain.   ACTIVITY LIMITATIONS: carrying, lifting, bending, standing, squatting, sleeping, stairs, transfers, bed mobility, continence, and locomotion level  PARTICIPATION LIMITATIONS: meal prep, cleaning, laundry, driving, shopping, and community activity  PERSONAL FACTORS: Age, Time since onset of injury/illness/exacerbation, and 1-2 comorbidities: scoliosis and cardiac/pulmonary history  are also affecting patient's functional outcome.   REHAB POTENTIAL: Good  CLINICAL DECISION MAKING: Stable/uncomplicated  EVALUATION COMPLEXITY: Low   GOALS: Goals reviewed with patient? Yes  SHORT TERM GOALS: Target date: 04/28/23  Pt will trial and participate in aquatic PT and be able to fully participate in sessions with breaks as needed for SOB. Baseline: Goal status: deferred for now secondary to Pt not interested in trying aquatics  2.  Pt will be able to  perform an aquatic to demo improved endurance in buoyant environment. Baseline:  Goal status: deferred for now secondary to Pt not interested in trying aquatics  3.  Pt will report improved Lt flank pain by at least 25% most days of the week. Baseline:  Goal status: INITIAL    LONG TERM GOALS: Target date: 05/26/23  Pt will report improved pain level down to 3-4/10 at least several days of the week. Baseline:  Goal status: INITIAL  2.  Pt will be able to perform up to 15 min of standing household chores without exacerbation of symptoms Baseline:  Goal status: INITIAL  3.  Pt will be compliant and ind with HEP to help manage chronic pain. Baseline:  Goal status: INITIAL  4.  Pt will be able to participate in a with breaks as needed for SOB to demo improved endurance. Baseline:  Goal status: INITIAL    PLAN:  PT FREQUENCY: 2x/week  PT DURATION: 8 weeks  PLANNED INTERVENTIONS:  Therapeutic exercises, Therapeutic activity, Neuromuscular re-education, Balance training, Gait training, Patient/Family education, Self Care, Joint mobilization, Aquatic Therapy, Electrical stimulation, Spinal mobilization, Cryotherapy, Moist heat, and Manual therapy.  PLAN FOR NEXT SESSION:  aquatics - deferred for now secondary to Pt not interested in trying aquatics  Land-based: more focused on manual techniques: STM with some NuStep, gait endurance, postural strength as tol  Pt also doing pelvic floor PT at this facility  [employer], PT 05/18/23 1:39 PM

## 2023-05-21 DIAGNOSIS — K8020 Calculus of gallbladder without cholecystitis without obstruction: Secondary | ICD-10-CM | POA: Diagnosis not present

## 2023-05-21 DIAGNOSIS — R310 Gross hematuria: Secondary | ICD-10-CM | POA: Diagnosis not present

## 2023-05-21 DIAGNOSIS — R188 Other ascites: Secondary | ICD-10-CM | POA: Diagnosis not present

## 2023-05-21 DIAGNOSIS — N281 Cyst of kidney, acquired: Secondary | ICD-10-CM | POA: Diagnosis not present

## 2023-05-22 ENCOUNTER — Encounter: Payer: Self-pay | Admitting: Primary Care

## 2023-05-22 ENCOUNTER — Ambulatory Visit: Payer: Medicare Other | Admitting: Primary Care

## 2023-05-22 VITALS — BP 100/70 | HR 81 | Ht 60.0 in | Wt 114.6 lb

## 2023-05-22 DIAGNOSIS — I5032 Chronic diastolic (congestive) heart failure: Secondary | ICD-10-CM

## 2023-05-22 DIAGNOSIS — J90 Pleural effusion, not elsewhere classified: Secondary | ICD-10-CM

## 2023-05-22 NOTE — Patient Instructions (Addendum)
Shortness of breath felt to be related to several underlying medical condition including pulmonary embolism in Jan 2023, scoliosis, bronchiectasis, mitral valve leak and and grade 2 heart muscle stiffness, atrial flutter, pleural effusion R > Left since July 2023  Treatment of pleural effusion is to continue diuretics for heat failure, other option is something called a thoracentesis which is medical procedure where a needle is inserted in the lung to drain fluid    Recommendation: - Try Delsym cough syrup  - Continue Torsemide as directed (this is your fluid pill) - Use flutter valve 5-10 breaths three times a day  - Use Albuterol rescue inhaler 2 puffs every 6 hours ONLY if you are having shortness of breath/wheezing symptoms that do not improve with rest    Follow-up: - 3 months with Dr. Marchelle Gearing

## 2023-05-22 NOTE — Progress Notes (Signed)
@Patient  ID: Janet Nguyen, female    DOB: 10-16-1931, 89 y.o.   MRN: [id number]  Chief Complaint  Patient presents with   Follow-up    Referring provider: [name], MD  HPI: 89 year old female, never smoked.  Past medical history significant for hypertension, heart failure, A-fib, pulmonary embolism, pulmonary hypertension, obstructive sleep apnea, bronchiectasis.  Patient of Dr. Marchelle Gearing   05/22/2023 Patient presents today for 1 month follow-up.  - Hx sleep apnea intolerant to CPAP  - PE diagnosed in January 2023, complete 1 year treatment. Dr. Tresa Endo is managing Eliquis due to afib,.  - Hx bronchiectasis. No needs for Spiriva. Use flutter valve 5 times a day. CXR in June showed persisting right greater than left pleural effusion with associated atelectasis/consolidation - Echocardiogram was abnormal, following with cardiology and was seen in July - Maintained on Torsemide   She remains frustrated about her health issues. She is unclear her medical diagnosis and why she is so short of breath. She has chronic dyspnea symptoms. Up until her 70s she was very active. She is out of breath all the time. She has been seeing Dr. Marchelle Gearing since 2023. Dyspnea is felt to be multifactorial due to scoliosis, bronchiectasis, mitral valve regurgitation, grade 2 diastolic dysfunction, a-flutter and pulmonary embolism in January 2023. She is unclear how to use flutter valve and rescue inhaler.   [place] Symptom Assessment Numerical Scale 0 is no problem -> 10 worst problem 07/21/2022     03/02/2023   05/22/2023   No Pain -> Worst pain 0 5 6-7 back pain  No Tiredness -> Worset tiredness 4 5 6-7 with exertion  No Nausea -> Worst nausea 2 0 0  No Depression -> Worst depression 8 5 5- some days are better than others  No Anxiety -> Worst Anxiety 8 5 5   No Drowsiness -> Worst Drowsiness 8 3 3   Best appetite-> Worst Appetitle 4 1 2   Best Feeling of well being -> Worst feeling 8 1 ?-  unsure how to answer  No dyspnea-> Worst dyspnea 2 8 2-3 at rest  Other problem (none -> severe) RLE > LLE Edema   Leg swelling is better  Othre issues Epistaxis mild x 1 6   Completed by  patoient   Provider/patient                Allergies  Allergen Reactions   Gluten Meal Other (See Comments)    Unknown   Naproxen Other (See Comments)    Stomach upset    Immunization History  Administered Date(s) Administered   Fluad Quad(high Dose 65+) 07/11/2022   Influenza Split 06/26/2014   Influenza, High Dose Seasonal PF 06/15/2015, 06/19/2016, 07/24/2017, 05/24/2019, 06/20/2021   Influenza,inj,quad, With Preservative 06/29/2018   Influenza-Unspecified 06/30/2013   PFIZER(Purple Top)SARS-COV-2 Vaccination 10/14/2019, 11/04/2019, 07/18/2020, 07/01/2021   Pneumococcal Conjugate-13 03/09/2014   Tdap 03/21/2009, 05/29/2021    Past Medical History:  Diagnosis Date   Arrhythmia    History of SVT with documented PVC'S and  PAC'S  12/08/12 Nuc stress test normal LV EF 74%  Event Monitor  12/01/12-01/03/13   Atrial flutter (HCC)    Celiac disease    treated by Dr. Kinnie Scales   GERD (gastroesophageal reflux disease)    Hypertension    Intervertebral disc stenosis of neural canal of cervical region    Irregular heart beat 11/30/2012   ECHO-EF 60-65%   Osteoporosis    PMR (polymyalgia rheumatica) (HCC)    Dr. Mallie Mussel; pt states  she was diagnosed 10-15 years ago, not treated at this time or any issues that she is aware of.   Scoliosis    Scoliosis    Sleep apnea 10/02/11 [facility]   Sleep study AHI -total sleep 10.3/hr  64.0/ hr during REM sleep.RDI 22.8/hr during total sleep 64.0/hr during REM sleep The lowest O2 sat during Non-REM and REM sleep was 86% and 88% respectively. 04/08/12 CPAP/BIPAP titration study [facility]    Tobacco History: Social History   Tobacco Use  Smoking Status Never  Smokeless Tobacco Never  Tobacco Comments   Never smoke  12/12/21   Counseling given: Not Answered Tobacco comments: Never smoke 12/12/21   Outpatient Medications Prior to Visit  Medication Sig Dispense Refill   acetaminophen (TYLENOL) 500 MG tablet Take 500 mg by mouth every 8 (eight) hours as needed for moderate pain.     albuterol (VENTOLIN HFA) 108 (90 Base) MCG/ACT inhaler Inhale 2 puffs into the lungs every 4 (four) hours as needed.     Aloe-Sodium Chloride (AYR SALINE NASAL GEL NA) Place 1 application  into the nose daily as needed (rhinitis).     ALPRAZolam (XANAX) 0.5 MG tablet Take 0.25-0.5 mg by mouth 2 (two) times daily as needed for anxiety.     apixaban (ELIQUIS) 5 MG TABS tablet Take 1 tablet (5 mg total) by mouth daily.     digoxin (LANOXIN) 0.125 MG tablet TAKE 1/2 TABLET BY MOUTH DAILY 45 tablet 3   diltiazem (CARDIZEM CD) 120 MG 24 hr capsule TAKE ONE CAPSULE BY MOUTH DAILY 90 capsule 1   Doxylamine-Phenylephrine-APAP (SINUS & CONGESTION DAY/NIGHT) MISC 1 tablet Orally once a day As needed     mirtazapine (REMERON) 15 MG tablet Take 15 mg by mouth at bedtime.     nitrofurantoin, macrocrystal-monohydrate, (MACROBID) 100 MG capsule Take 100 mg by mouth 2 (two) times daily.     Phenylephrine-APAP-guaiFENesin (EQ SINUS CONGESTION & PAIN PO) Take 1 tablet by mouth daily as needed (for sinus pain). Contains acetaminophen 325 mg and Phenylephrine 5 mg     potassium chloride (KLOR-CON M) 10 MEQ tablet TAKE 1 TABLET BY MOUTH DAILY 90 tablet 3   metoprolol succinate (TOPROL-XL) 50 MG 24 hr tablet Take 100 mg ( 2 tablets of 50 mg ) in the morning  and  75 mg ( 1 and 1/2 tablet  of 50 mg) in the evening 270 tablet 3   Biotin w/ Vitamins C & E (HAIR SKIN & NAILS GUMMIES PO) Take 1 tablet by mouth daily. (Patient not taking: Reported on 05/22/2023)     Calcium Carb-Ergocalciferol (CHEWABLE CALCIUM/D PO) Take 1 each by mouth daily. (Patient not taking: Reported on 05/22/2023)     clidinium-chlordiazePOXIDE (LIBRAX) 5-2.5 MG capsule Take 1 capsule  by mouth daily as needed. (Patient not taking: Reported on 05/22/2023) 60 capsule 3   fluticasone (FLONASE) 50 MCG/ACT nasal spray Place 1 spray into both nostrils daily as needed for allergies or rhinitis. (Patient not taking: Reported on 04/20/2023)     polyethylene glycol powder (GLYCOLAX/MIRALAX) 17 GM/SCOOP powder Take 17 g by mouth daily as needed for moderate constipation. (Patient not taking: Reported on 04/20/2023)     Tiotropium Bromide Monohydrate (SPIRIVA RESPIMAT) 1.25 MCG/ACT AERS Inhale 2 puffs into the lungs daily. (Patient not taking: Reported on 04/20/2023) 4 g 5   torsemide (DEMADEX) 20 MG tablet Take 2 tablets (40 mg total) by mouth daily. Take 1 Tablet in the Morning  and 1 Tablet in the evening (Patient not taking: Reported on 05/22/2023) 180 tablet 3   zolpidem (AMBIEN) 5 MG tablet Take 2.5 mg by mouth at bedtime. (Patient not taking: Reported on 05/22/2023)     No facility-administered medications prior to visit.   Review of Systems  Review of Systems  Constitutional:  Positive for fatigue.  HENT:  Positive for postnasal drip.   Respiratory:  Positive for cough and shortness of breath. Negative for chest tightness and wheezing.    Physical Exam  BP 100/70 (BP Location: Left Arm, Cuff Size: Normal)   Pulse 81   Ht 5' (1.524 m)   Wt 114 lb 9.6 oz (52 kg)   SpO2 98%   BMI 22.38 kg/m  Physical Exam Constitutional:      General: She is not in acute distress.    Appearance: Normal appearance. She is not ill-appearing.  HENT:     Head: Normocephalic and atraumatic.     Mouth/Throat:     Mouth: Mucous membranes are moist.     Pharynx: Oropharynx is clear.  Cardiovascular:     Rate and Rhythm: Normal rate and regular rhythm.  Pulmonary:     Effort: Pulmonary effort is normal.     Breath sounds: Normal breath sounds.  Skin:    General: Skin is warm and dry.  Neurological:     General: No focal deficit present.     Mental Status: She is alert and oriented to person,  place, and time. Mental status is at baseline.  Psychiatric:        Mood and Affect: Mood normal.        Behavior: Behavior normal.        Thought Content: Thought content normal.        Judgment: Judgment normal.      Lab Results:  CBC    Component Value Date/Time   WBC 6.1 03/02/2023 1141   RBC 4.70 03/02/2023 1141   HGB 14.2 03/02/2023 1141   HGB 14.4 09/15/2022 1604   HCT 44.1 03/02/2023 1141   HCT 43.3 09/15/2022 1604   PLT 233.0 03/02/2023 1141   PLT 269 09/15/2022 1604   MCV 93.9 03/02/2023 1141   MCV 89 09/15/2022 1604   MCH 29.7 09/15/2022 1604   MCH 30.8 12/03/2021 0532   MCHC 32.3 03/02/2023 1141   RDW 14.0 03/02/2023 1141   RDW 13.2 09/15/2022 1604   LYMPHSABS 1.1 03/02/2023 1141   MONOABS 0.8 03/02/2023 1141   EOSABS 0.1 03/02/2023 1141   BASOSABS 0.3 (H) 03/02/2023 1141    BMET    Component Value Date/Time   NA 138 03/30/2023 1202   K 4.7 03/30/2023 1202   CL 97 03/30/2023 1202   CO2 29 03/30/2023 1202   GLUCOSE 92 03/30/2023 1202   GLUCOSE 106 (H) 03/02/2023 1141   BUN 21 03/30/2023 1202   CREATININE 1.25 (H) 03/30/2023 1202   CREATININE 0.89 02/11/2021 1137   CREATININE 0.85 12/03/2016 0845   CALCIUM 9.5 03/30/2023 1202   GFRNONAA 47 (L) 12/04/2021 0537   GFRNONAA >60 02/11/2021 1137   GFRAA >60 12/05/2017 2029    BNP    Component Value Date/Time   BNP 1,060.8 (H) 03/30/2023 1202   BNP 640.0 (H) 12/02/2021 1752    ProBNP    Component Value Date/Time   PROBNP 1,382.0 (H) 03/02/2023 1141    Imaging: No results found.   Assessment & Plan:   Dyspnea - Shortness of breath felt to be related  to several underlying medical condition including pulmonary embolism in Jan 2023, scoliosis, bronchiectasis, mitral valve leak and and grade 2 heart muscle stiffness, atrial flutter, pleural effusion R > Left since July 2023  Recommendation: - Continue Torsemide as directed (this is your fluid pill) - Use flutter valve 5-10 breaths three  times a day  - Use Albuterol rescue inhaler 2 puffs every 6 hours ONLY if you are having shortness of breath/wheezing symptoms that do not improve with rest    Pleural effusion - Patient has had pleural effusion since July 2023, felt to be related to cor pulmonale. Continue diuretics for heat failure, recommending conservative management. If worsening can consider thoracentesis. Reviewed with patient at length.   Bronchiectasis (HCC) - Encourage pulmonary hygiene. Educated on how to use flutter valve and HFA inhaler. Advised she try Delsym OTC for cough.   Radon exposure - Radon Exposure x 34 years. Right lower lobe lung nodule 7.21mm on CT January 2023, resolved July 2023. CT only if clinically indicated  Pulmonary embolism (HCC) - Completed 1 year of treatment, continues on Eliquis for hx afib- managed by Dr. Tresa Endo  Chronic diastolic heart failure (HCC) - Appears euvolemic on exam - Continue Torsemide per cardiology   Follow-up: - 3 months with Dr. Leavy Cella, NP 05/30/2023

## 2023-05-25 ENCOUNTER — Encounter: Payer: Self-pay | Admitting: Physical Therapy

## 2023-05-25 ENCOUNTER — Ambulatory Visit: Payer: Medicare Other | Admitting: Physical Therapy

## 2023-05-25 DIAGNOSIS — M546 Pain in thoracic spine: Secondary | ICD-10-CM | POA: Diagnosis not present

## 2023-05-25 DIAGNOSIS — R279 Unspecified lack of coordination: Secondary | ICD-10-CM | POA: Diagnosis not present

## 2023-05-25 DIAGNOSIS — R293 Abnormal posture: Secondary | ICD-10-CM | POA: Diagnosis not present

## 2023-05-25 DIAGNOSIS — M4125 Other idiopathic scoliosis, thoracolumbar region: Secondary | ICD-10-CM

## 2023-05-25 DIAGNOSIS — M6281 Muscle weakness (generalized): Secondary | ICD-10-CM | POA: Diagnosis not present

## 2023-05-25 DIAGNOSIS — M542 Cervicalgia: Secondary | ICD-10-CM

## 2023-05-25 NOTE — Therapy (Signed)
OUTPATIENT PHYSICAL THERAPY THORACOLUMBAR TREATMENT   Patient Name: Janet Nguyen MRN: [id number] DOB:29-Mar-1932, 89 y.o., female Today's Date: 05/25/2023  END OF SESSION:  PT End of Session - 05/25/23 1100     Visit Number 5    Date for PT Re-Evaluation 07/06/23    Authorization Type medicare - needs KX at visit 15    Progress Note Due on Visit 10    PT Start Time 1101    PT Stop Time 1145    PT Time Calculation (min) 44 min    Activity Tolerance Patient tolerated treatment well    Behavior During Therapy [facility] for tasks assessed/performed                 Past Medical History:  Diagnosis Date   Arrhythmia    History of SVT with documented PVC'S and  PAC'S  12/08/12 Nuc stress test normal LV EF 74%  Event Monitor  12/01/12-01/03/13   Atrial flutter (HCC)    Celiac disease    treated by Dr. Kinnie Scales   GERD (gastroesophageal reflux disease)    Hypertension    Intervertebral disc stenosis of neural canal of cervical region    Irregular heart beat 11/30/2012   ECHO-EF 60-65%   Osteoporosis    PMR (polymyalgia rheumatica) (HCC)    Dr. Mallie Mussel; pt states she was diagnosed 10-15 years ago, not treated at this time or any issues that she is aware of.   Scoliosis    Scoliosis    Sleep apnea 10/02/11 [facility]   Sleep study AHI -total sleep 10.3/hr  64.0/ hr during REM sleep.RDI 22.8/hr during total sleep 64.0/hr during REM sleep The lowest O2 sat during Non-REM and REM sleep was 86% and 88% respectively. 04/08/12 CPAP/BIPAP titration study [facility]   Past Surgical History:  Procedure Laterality Date   APPENDECTOMY     ruptured at age 38 and had surgery   CARDIAC CATHETERIZATION  01/27/06   CARDIOVERSION N/A 01/08/2022   Procedure: CARDIOVERSION;  Surgeon: Meriam Sprague, MD;  Location: [facility] ENDOSCOPY;  Service: Cardiovascular;  Laterality: N/A;   cataract surgery  2015   Dr. Elmer Picker; March & April 2015   Patient Active Problem  List   Diagnosis Date Noted   Secondary hypercoagulable state (HCC) 12/12/2021   Hyponatremia 12/04/2021   Acute CHF (congestive heart failure) (HCC) 12/02/2021   Acute on chronic diastolic CHF (congestive heart failure) (HCC) 12/02/2021   Atrial fibrillation with rapid ventricular response (HCC) 12/02/2021   Essential hypertension 12/02/2021   GERD without esophagitis 12/02/2021   Obstructive sleep apnea 12/02/2021   Interstitial lung disease (HCC) 12/02/2021   History of pulmonary embolism 12/02/2021   Pulmonary embolism (HCC) 11/06/2021   Bronchiectasis (HCC) 11/06/2021   Deviated septum 05/08/2020   Mass of subcutaneous tissue 05/02/2020   Vertigo 06/16/2019   Pulmonary hypertension, unspecified (HCC) 04/14/2019   Ischemic colitis (HCC) 04/14/2019   Migraine with aura and without status migrainosus, not intractable 11/08/2018   Degeneration of lumbar intervertebral disc 10/14/2018   Hoarseness of voice 03/04/2018   Abdominal pain 07/01/2017   Diverticulitis, colon    Metabolic acidosis, increased anion gap    Constipation 04/14/2017   Lumbar hernia 04/14/2017   Presbycusis of both ears 01/10/2017   Tinnitus aurium, bilateral 01/10/2017   Gastroesophageal reflux disease 08/20/2016   Hemoptysis 08/20/2016   Obstructive sleep apnea of adult 08/20/2016   Rhinitis, chronic 08/20/2016   Throat pain in adult 08/20/2016  Fatigue 12/23/2015   Sciatica of right side 10/06/2015   History of migraine headaches 10/06/2015   Frequent PVCs 12/28/2013   Premature atrial contractions 12/28/2013   PSVT (paroxysmal supraventricular tachycardia) (HCC) 12/28/2013   Heart palpitations 07/13/2013   Sleep apnea 04/11/2013   Scoliosis 04/11/2013   Atypical atrial flutter (HCC) 11/29/2012   Chest pain, atypical 11/29/2012   Fibromyalgia syndrome 11/29/2012   Chronic steroid use 11/29/2012    PCP: [name], MD  REFERRING PROVIDER: [name], MD  REFERRING DIAG: M41.9  (ICD-10-CM) - Scoliosis, unspecified  Rationale for Evaluation and Treatment: Rehabilitation  THERAPY DIAG:  Muscle weakness (generalized)  Abnormal posture  Other idiopathic scoliosis, thoracolumbar region  Cervicalgia  Pain in thoracic spine  ONSET DATE: chronic  SUBJECTIVE:                                                                                                                                                                                           SUBJECTIVE STATEMENT: I have been rushing around this morning doing things around the house.  I am tired.  I am having more imaging and appointments this week for my bladder issues.  I am full of anxiety over all of this.  I get short of breath very easily. Everything you do for me in PT is helpful.  My pain reduces and carries over for multiple days.  I appreciate the stretching and length I feel I get when you work on my Lt side.  Even my bowel movements are improved after you work on me.  My tasks at home are made easier and I feel more energy to do a few more than I used to as I feel better.    PERTINENT HISTORY:  Severe scoliosis Pulmonary HTN Cardioversion 12/2021 osteoporosis Depression Lives alone, has been treated at this facility many times    PAIN:  PAIN:  Are you having pain? Yes NPRS scale: 4-7/10 depending on activity and day Pain location: left flank Pain orientation: Left  PAIN TYPE: aching, dull, and tight Pain description: constant  Aggravating factors: overeating (no room for my stomach), walking brings me SOB Relieving factors: Tylenol but always constant pain   PRECAUTIONS: Other: low bone density  WEIGHT BEARING RESTRICTIONS: No  FALLS:  Has patient fallen in last 6 months? No  LIVING ENVIRONMENT: Lives with: lives alone Lives in: House/apartment Stairs: No Has following equipment at home: None  OCCUPATION: retired  PLOF: Independent  PATIENT GOALS: reduce pain, walk with less  SOB, improve flexibility  NEXT MD VISIT: as needed  OBJECTIVE:   DIAGNOSTIC FINDINGS:  EXAM: 2024 DG  HIP (WITH OR WITHOUT PELVIS) 2-3V RIGHT   COMPARISON:  None Available.   FINDINGS: There is no evidence of hip fracture or dislocation. There is no evidence of arthropathy or other focal bone abnormality.   IMPRESSION: Negative.  IMPRESSION:  2014  1. Multilevel slight degenerative disc disease with no neural  impingement.  2. Fairly severe bilateral facet arthritis and foraminal stenosis  at T10-11.  3.  Multilevel degenerative facet arthritis.    IMPRESSION 2014: The patient has severe degenerative disc and joint  disease at multiple levels of the lumbar spine, essentially  unchanged since the prior CT scan dated 01/29/2011.  Most severe  neural impingement is at L2-3 where there is moderately severe  spinal stenosis.  There is chronic encroachment on the right  lateral recess at L3-4 and on the left lateral recess at L1-2.  PATIENT SURVEYS:  Modified Oswestry 23/50, moderate disability   SCREENING FOR RED FLAGS: Bowel or bladder incontinence: Yes: bowel >bladder Spinal tumors: No Cauda equina syndrome: No Compression fracture: No Abdominal aneurysm: No  COGNITION: Overall cognitive status: Within functional limits for tasks assessed     SENSATION: WFL  MUSCLE LENGTH: Hamstrings: Right 65 deg; Left 65 deg Signif adaptive shortening of Lt QL, lat, paraspinals and obliques along Lt trunk secondary to scoliotic curve  POSTURE:  significant scoliosis with Lt ilium in contact with lower ribcage  PALPATION: 8/26: Improving mobility of soft tissues along Lt trunk, ongoing tenderness along posterior pelvis, Lt ribcage, Lt shoulder girdle  Tender and tight along Lt QL, lat, paraspinals and obliques Tender bil SI joints, gluteals  LUMBAR ROM:   AROM eval 8/26  Flexion Fingers to ankles, pain Fingers to ankles, pain  Extension NT NT  Right lateral flexion 5,  pain 10  Left lateral flexion 5, pain 5 pain  Right rotation 50% 70%  Left rotation 50% 70%   (Blank rows = not tested)  LOWER EXTREMITY ROM:     Passive  Right eval Left eval  Hip flexion    Hip extension    Hip abduction    Hip adduction    Hip internal rotation 8 8  Hip external rotation 40 40  Knee flexion    Knee extension    Ankle dorsiflexion    Ankle plantarflexion    Ankle inversion    Ankle eversion     (Blank rows = not tested)  LOWER EXTREMITY MMT:   Hips 4/5 bil Knees 4/5 bil  LUMBAR SPECIAL TESTS:    FUNCTIONAL TESTS:  05/25/23: 448' RPE 9/10 due to SOB - needed a 20" break at 2'  Able to balance in SLS on Rt x10", on Lt x 3"  GAIT: Distance walked: short distance within clinic Assistive device utilized: None Level of assistance: Complete Independence Comments: gets quickly SOB  TODAY'S TREATMENT:  DATE:  8/26: 448' RPE 9/10 due to SOB - needed a 20" break at 2' Rt SL STM elongation along Lt side of trunk thoracic paraspinals, obliques, QL, lumbar paraspinals, Lt gluteals (Pt needs 2 pillows for head and extra towels under Rt hip) Supine LTR x30" knees toward Rt with Lt OH reach to enhance stretch Supine piriformis stretch x30" each Supine HS stretch with ankle pumps and circles x 30" DKTC with rocking side to side Windshield wipers x10 Bridge 10x5" holds Seated in chair bil upper quadrant STM and passive pectoral stretching by PT pulling shoulders back in upright posture  Verbal review of HEP  8/19: Rt SL STM elongation along Lt side of trunk thoracic paraspinals, obliques, QL, lumbar paraspinals, Lt gluteals (Pt needs 2 pillows for head and extra towels under Rt hip) Supine LTR x30" each way with OH reach to enhance stretch Supine fig 4 x30" each Butterfly stretch supine 2x20" Windshield wipers x10 SKTC/DKTC  x10 each Bridge x10 Seated in chair bil upper quadrant STM and passive pectoral stretching by PT pulling shoulders back in upright posture  Discussion about getting a rollator to allow for neighborhood ambulation with built in seat for SOB  8/6: Seated in chair bil upper quadrant STM and passive pectoral stretching by PT pulling shoulders back in upright posture  Rt SL STM elongation along Lt side of trunk thoracic paraspinals, obliques, QL, lumbar paraspinals, Lt gluteals (Pt needs 2 pillows for head and extra towels under Rt hip) Rt SL Lt UE OH and LE reach 3x10" for Lt trunk elongation Supine Lt hip flexor stretch off side of table 1x30" Supine LTR x30" each way with OH reach to enhance stretch Supine fig 4 x30" each Butterfly stretch supine 2x20"   PATIENT EDUCATION:  Education details: aquatic info Person educated: Patient Education method: Chief Technology Officer Education comprehension: verbalized understanding  HOME EXERCISE PROGRAM: Aquatic info - willing to try Has land based stretches and postural strength with yellow tband from previous sessions  ASSESSMENT:  CLINICAL IMPRESSION: Pt continues to benefit from skilled PT guiding postural elongation through manual techniques and targeted stretching.  She was able to participate in today needing a single break due to SOB, covering 448' with an RPE of 8-9/10 due to SOB vs pain or fatigue.  She reports she is doing more tasks around the house due to improved pain and energy with PT.  She will continue to benefit from skilled PT to reduce symptoms related to severe degenerative scoliosis.  OBJECTIVE IMPAIRMENTS: cardiopulmonary status limiting activity, decreased activity tolerance, decreased balance, decreased endurance, decreased mobility, decreased ROM, decreased strength, impaired flexibility, improper body mechanics, postural dysfunction, and pain.   ACTIVITY LIMITATIONS: carrying, lifting, bending, standing,  squatting, sleeping, stairs, transfers, bed mobility, continence, and locomotion level  PARTICIPATION LIMITATIONS: meal prep, cleaning, laundry, driving, shopping, and community activity  PERSONAL FACTORS: Age, Time since onset of injury/illness/exacerbation, and 1-2 comorbidities: scoliosis and cardiac/pulmonary history  are also affecting patient's functional outcome.   REHAB POTENTIAL: Good  CLINICAL DECISION MAKING: Stable/uncomplicated  EVALUATION COMPLEXITY: Low   GOALS: Goals reviewed with patient? Yes  SHORT TERM GOALS: Target date: 04/28/23  Pt will trial and participate in aquatic PT and be able to fully participate in sessions with breaks as needed for SOB. Baseline: Goal status: deferred for now secondary to Pt not interested in trying aquatics  2.  Pt will be able to perform an aquatic to demo improved endurance in buoyant environment. Baseline:  Goal status: deferred for now secondary to Pt not interested in trying aquatics  3.  Pt will report improved Lt flank pain by at least 25% most days of the week. Baseline:  Goal status: met 8/26    LONG TERM GOALS: Target date: 05/26/23  Pt will report improved pain level down to 3-4/10 at least several days of the week. Baseline:  Goal status: some days, ongoing 8/26  2.  Pt will be able to perform up to 15 min of standing household chores without exacerbation of symptoms Baseline:  Goal status: met 8/26  3.  Pt will be compliant and ind with HEP to help manage chronic pain. Baseline:  Goal status: ongoing  4.  Pt will be able to participate in a with breaks as needed for SOB to demo improved endurance. Baseline:  Goal status: ongoing, has done with single break due to SOB at 2' 8/26    PLAN:  PT FREQUENCY: 2x/week  PT DURATION: 8 weeks  PLANNED INTERVENTIONS: Therapeutic exercises, Therapeutic activity, Neuromuscular re-education, Balance training, Gait training, Patient/Family education,  Self Care, Joint mobilization, Aquatic Therapy, Electrical stimulation, Spinal mobilization, Cryotherapy, Moist heat, and Manual therapy.  PLAN FOR NEXT SESSION:  aquatics - deferred for now secondary to Pt not interested in trying aquatics  Land-based: more focused on manual techniques: STM with some NuStep, gait endurance, postural strength as tol  Pt also doing pelvic floor PT at this facility  [employer], PT 05/25/23 1:46 PM

## 2023-05-26 DIAGNOSIS — R310 Gross hematuria: Secondary | ICD-10-CM | POA: Diagnosis not present

## 2023-05-26 DIAGNOSIS — N3021 Other chronic cystitis with hematuria: Secondary | ICD-10-CM | POA: Diagnosis not present

## 2023-05-27 ENCOUNTER — Telehealth: Payer: Self-pay | Admitting: Cardiovascular Disease

## 2023-05-27 ENCOUNTER — Other Ambulatory Visit: Payer: Self-pay | Admitting: Cardiovascular Disease

## 2023-05-27 DIAGNOSIS — I4819 Other persistent atrial fibrillation: Secondary | ICD-10-CM

## 2023-05-27 NOTE — Telephone Encounter (Signed)
Pt c/o medication issue:  1. Name of Medication: metoprolol succinate (TOPROL-XL) 50 MG 24 hr tablet   2. How are you currently taking this medication (dosage and times per day)? TAKE 1 AND 1/2 TABLETS BY MOUTH TWO TIMES A DAY   3. Are you having a reaction (difficulty breathing--STAT)? No  4. What is your medication issue? Pt states she wants to know the correct amount she needs to take. Pt states she thinks the dosage has changed. Please advise

## 2023-05-27 NOTE — Telephone Encounter (Signed)
Clarification on Medications, per last OV notes, patient is to take Metoprolol Succinate 100 mg in Am and 75 mg in evening.  RX sent today with different dosage.  Patient states taking as mentioned above but was concerned as RX called in differently.  Please advise

## 2023-05-28 ENCOUNTER — Ambulatory Visit: Payer: Medicare Other

## 2023-05-29 NOTE — Telephone Encounter (Signed)
So as far as I can tell that looks like the medicine she is taking according to her med list.  She is taking 2 of the 50 mg tablets at 1 time and then 1-1/2 of the other time.  Usually we do not do Toprol-XL that way, but sometimes for people that need good rate control we do.  It seems like that was the way that the meds were ordered so I would continue it that way.  Bryan Lemma, MD

## 2023-05-30 DIAGNOSIS — R0609 Other forms of dyspnea: Secondary | ICD-10-CM | POA: Insufficient documentation

## 2023-05-30 DIAGNOSIS — X3901XA Exposure to radon, initial encounter: Secondary | ICD-10-CM | POA: Insufficient documentation

## 2023-05-30 DIAGNOSIS — J90 Pleural effusion, not elsewhere classified: Secondary | ICD-10-CM | POA: Insufficient documentation

## 2023-05-30 DIAGNOSIS — R0600 Dyspnea, unspecified: Secondary | ICD-10-CM | POA: Insufficient documentation

## 2023-05-30 NOTE — Assessment & Plan Note (Signed)
-   Appears euvolemic on exam - Continue Torsemide per cardiology

## 2023-05-30 NOTE — Assessment & Plan Note (Signed)
-   Radon Exposure x 34 years. Right lower lobe lung nodule 7.75mm on CT January 2023, resolved July 2023. CT only if clinically indicated

## 2023-05-30 NOTE — Assessment & Plan Note (Addendum)
-   Shortness of breath felt to be related to several underlying medical condition including pulmonary embolism in Jan 2023, scoliosis, bronchiectasis, mitral valve leak and and grade 2 heart muscle stiffness, atrial flutter, pleural effusion R > Left since July 2023  Recommendation: - Continue Torsemide as directed (this is your fluid pill) - Use flutter valve 5-10 breaths three times a day  - Use Albuterol rescue inhaler 2 puffs every 6 hours ONLY if you are having shortness of breath/wheezing symptoms that do not improve with rest

## 2023-05-30 NOTE — Assessment & Plan Note (Signed)
-   Patient has had pleural effusion since July 2023, felt to be related to cor pulmonale. Continue diuretics for heat failure, recommending conservative management. If worsening can consider thoracentesis. Reviewed with patient at length.

## 2023-05-30 NOTE — Assessment & Plan Note (Addendum)
-   Encourage pulmonary hygiene. Educated on how to use flutter valve and HFA inhaler. Advised she try Delsym OTC for cough.

## 2023-05-30 NOTE — Assessment & Plan Note (Signed)
-   Completed 1 year of treatment, continues on Eliquis for hx afib- managed by Dr. Tresa Endo

## 2023-06-02 MED ORDER — METOPROLOL SUCCINATE ER 50 MG PO TB24: ORAL_TABLET | ORAL | 3 refills | Status: DC

## 2023-06-02 NOTE — Telephone Encounter (Signed)
Called patient . LM to call back to confirm dosage  New RX updated and sent to pharmacy

## 2023-06-02 NOTE — Telephone Encounter (Signed)
She should continue her old Rx of 100 mg in AM , 75 mg in PM. Thanks

## 2023-06-02 NOTE — Telephone Encounter (Signed)
Sorry for any miscommunication.  She has been taking 100 mg in the AM and then 75 mg in the evening.  On August 28 this was discontinued by the CMA and the new RX for 75 mg BID.  She is not sure why the change or where this came from. I do not see this either so want to clarify when her dose changed to 75 mg twice a day?  Or can she continue her 100 mg in the am and 75 in the evening and if so I will change her med list to reflect this.

## 2023-06-04 ENCOUNTER — Ambulatory Visit: Payer: Medicare Other | Attending: Family

## 2023-06-04 DIAGNOSIS — M6281 Muscle weakness (generalized): Secondary | ICD-10-CM | POA: Diagnosis present

## 2023-06-04 DIAGNOSIS — R293 Abnormal posture: Secondary | ICD-10-CM | POA: Diagnosis present

## 2023-06-04 DIAGNOSIS — R279 Unspecified lack of coordination: Secondary | ICD-10-CM

## 2023-06-04 NOTE — Therapy (Signed)
OUTPATIENT PHYSICAL THERAPY FEMALE PELVIC Treatment   Patient Name: Janet Nguyen MRN: [id number] DOB:October 02, 1931, 89 y.o., female Today's Date: 06/04/2023  END OF SESSION:  PT End of Session - 06/04/23 1358     Visit Number 6    Date for PT Re-Evaluation 07/06/23    Authorization Type medicare - needs KX at visit 15    Progress Note Due on Visit 10    PT Start Time 1400    PT Stop Time 1440    PT Time Calculation (min) 40 min    Activity Tolerance Patient tolerated treatment well    Behavior During Therapy [facility] for tasks assessed/performed               Past Medical History:  Diagnosis Date   Arrhythmia    History of SVT with documented PVC'S and  PAC'S  12/08/12 Nuc stress test normal LV EF 74%  Event Monitor  12/01/12-01/03/13   Atrial flutter (HCC)    Celiac disease    treated by Dr. Kinnie Scales   GERD (gastroesophageal reflux disease)    Hypertension    Intervertebral disc stenosis of neural canal of cervical region    Irregular heart beat 11/30/2012   ECHO-EF 60-65%   Osteoporosis    PMR (polymyalgia rheumatica) (HCC)    Dr. Mallie Mussel; pt states she was diagnosed 10-15 years ago, not treated at this time or any issues that she is aware of.   Scoliosis    Scoliosis    Sleep apnea 10/02/11 [facility]   Sleep study AHI -total sleep 10.3/hr  64.0/ hr during REM sleep.RDI 22.8/hr during total sleep 64.0/hr during REM sleep The lowest O2 sat during Non-REM and REM sleep was 86% and 88% respectively. 04/08/12 CPAP/BIPAP titration study [facility]   Past Surgical History:  Procedure Laterality Date   APPENDECTOMY     ruptured at age 27 and had surgery   CARDIAC CATHETERIZATION  01/27/06   CARDIOVERSION N/A 01/08/2022   Procedure: CARDIOVERSION;  Surgeon: Meriam Sprague, MD;  Location: [facility] ENDOSCOPY;  Service: Cardiovascular;  Laterality: N/A;   cataract surgery  2015   Dr. Elmer Picker; March & April 2015   Patient Active Problem List    Diagnosis Date Noted   Dyspnea 05/30/2023   Pleural effusion 05/30/2023   Radon exposure 05/30/2023   Secondary hypercoagulable state (HCC) 12/12/2021   Hyponatremia 12/04/2021   Acute CHF (congestive heart failure) (HCC) 12/02/2021   Chronic diastolic heart failure (HCC) 12/02/2021   Atrial fibrillation with rapid ventricular response (HCC) 12/02/2021   Essential hypertension 12/02/2021   GERD without esophagitis 12/02/2021   Obstructive sleep apnea 12/02/2021   Interstitial lung disease (HCC) 12/02/2021   History of pulmonary embolism 12/02/2021   Pulmonary embolism (HCC) 11/06/2021   Bronchiectasis (HCC) 11/06/2021   Deviated septum 05/08/2020   Mass of subcutaneous tissue 05/02/2020   Vertigo 06/16/2019   Pulmonary hypertension, unspecified (HCC) 04/14/2019   Ischemic colitis (HCC) 04/14/2019   Migraine with aura and without status migrainosus, not intractable 11/08/2018   Degeneration of lumbar intervertebral disc 10/14/2018   Hoarseness of voice 03/04/2018   Abdominal pain 07/01/2017   Diverticulitis, colon    Metabolic acidosis, increased anion gap    Constipation 04/14/2017   Lumbar hernia 04/14/2017   Presbycusis of both ears 01/10/2017   Tinnitus aurium, bilateral 01/10/2017   Gastroesophageal reflux disease 08/20/2016   Hemoptysis 08/20/2016   Obstructive sleep apnea of adult 08/20/2016   Rhinitis, chronic  08/20/2016   Throat pain in adult 08/20/2016   Fatigue 12/23/2015   Sciatica of right side 10/06/2015   History of migraine headaches 10/06/2015   Frequent PVCs 12/28/2013   Premature atrial contractions 12/28/2013   PSVT (paroxysmal supraventricular tachycardia) (HCC) 12/28/2013   Heart palpitations 07/13/2013   Sleep apnea 04/11/2013   Scoliosis 04/11/2013   Atypical atrial flutter (HCC) 11/29/2012   Chest pain, atypical 11/29/2012   Fibromyalgia syndrome 11/29/2012   Chronic steroid use 11/29/2012    PCP: [name], MD   REFERRING  PROVIDER:    Randa Spike, FNP    REFERRING DIAG: R39.89 (ICD-10-CM) - Other symptoms and signs involving the genitourinary system   THERAPY DIAG:  Muscle weakness (generalized)  Abnormal posture  Unspecified lack of coordination  Rationale for Evaluation and Treatment: Rehabilitation  ONSET DATE: started earlier this year  SUBJECTIVE:                                                                                                                                                                                           SUBJECTIVE STATEMENT: Pt states that she is very discouraged. She is not doing exercises. She feels like her breathlessness is getting worse. She is on a lot of medications that make her feel ill. She does not feel like she has been well taken care of by healthcare team. She does feel like PT for her low back is helpful and she works on these exercises regularly. She states that her   PAIN:  Are you having pain? Yes NPRS scale: x/10 - not really a pain it is itchy and inflamed and using the A&D and wipes Pain location: External  Pain type: burning Pain description: intermittent   Aggravating factors: just always irritated Relieving factors: not sure  PRECAUTIONS: None  RED FLAGS: None   WEIGHT BEARING RESTRICTIONS: No  FALLS:  Has patient fallen in last 6 months? No  LIVING ENVIRONMENT: Lives with: lives alone Lives in: House/apartment   OCCUPATION: retired - exercise walking with shopping cart  PLOF: Independent  PATIENT GOALS: have less leakage/urgency  PERTINENT HISTORY:  Scoliosis, lung and heart disease, 4 vaginal deliveries   BOWEL MOVEMENT: Pain with bowel movement: No Type of bowel movement:Type (Bristol Stool Scale) solid and normal usually, skinny, Frequency daily, and Strain No Fully empty rectum: Yes: I think so Leakage: Yes:   Pads: Yes: 1 at night and 1 morning unless something happens Fiber supplement:  No  URINATION: Pain with urination: No Fully empty bladder: Yes: I don't know Stream:  not sure Urgency: Yes: more than once a week Frequency: sometimes  every hour, sometimes 1-2/day; wakes up due to back and then I just go to the bathroom Leakage: Urge to void and Walking to the bathroom Pads: Yes: 1 in morning and 1 at night   PREGNANCY: Vaginal deliveries 4   PROLAPSE:    OBJECTIVE:  06/04/23: SpO2: 98% HR: 84     DIAGNOSTIC FINDINGS:    PATIENT SURVEYS:    COGNITION: Overall cognitive status: Within functional limits for tasks assessed       GAIT:  Comments: wide BOS  POSTURE:  severe scoliosis  PELVIC ALIGNMENT:  LUMBARAROM/PROM: See ortho notes  LOWER EXTREMITY ROM:  See ortho notes  LOWER EXTREMITY MMT:  See ortho notes PALPATION:                  External Perineal Exam redness of vulva, small amount of stool                             Internal Pelvic Floor - not fully relax in between reps, hold for 2 seconds only, 5 quick flicks with diminishing strength  Patient confirms identification and approves PT to assess internal pelvic floor and treatment Yes  PELVIC MMT:   MMT eval  Vaginal   Internal Anal Sphincter 3/5  External Anal Sphincter 3/5  Puborectalis 3+/5  Diastasis Recti   (Blank rows = not tested)        TONE: Slightly lower  PROLAPSE: Not noted (did not do anterior pelvic assessment)  TODAY'S TREATMENT:                                                                                                                              DATE:  06/04/23 Neuromuscular re-education: Pelvic floor contraction training with external feedback Quick flicks Long holds  Therapeutic activities: Importance of complete emptying Internal vaginal or rectal exams to help with coordination of pelvic floor contraction  HEP compliance Training in multiple positions    05/06/23 Neuromuscular re-education: Pelvic floor muscle contractions  - practice 10 second holds in different positions  Exercises: Seated hip adduction 2 x 10 ball squeeze Seated hip abduction 2 x 10 yellow band Seated march 2 x 10 Therapeutic activities: Squatty potty problem solving Making sure she is completely emptying bowel Balloon breathing to help empty the bowel, no contractions to help bowel movement come down  04/27/23  Theract Using b sure pads, peri bottle, toileting and bulging with TC while sitting on mat using squatty potty  NMRE: TC/VC to tighten anal sphincters with exhale Supine (did some in supine but pt is very uncomfortable) Side lying - hip external and internal rotation - clam and reverse clams   PATIENT EDUCATION:  Education details: Access Code: [email or web address] Person educated: Patient Education method: Explanation and Handouts Education comprehension: verbalized understanding  HOME EXERCISE PROGRAM: Access Code: [email or web address] URL: [email or web address] Date: 04/27/2023 Prepared by: Dwana Curd  Exercises - Clamshell  -  1 x daily - 7 x weekly - 3 sets - 10 reps - Beginner Reverse Clamshell  - 1 x daily - 7 x weekly - 3 sets - 10 reps  BVTGJDAM new pelvic floor program   ASSESSMENT:  CLINICAL IMPRESSION: Pt seemingly very frustrated with treatment today. She had argument against most lifestyle intervention even when education provided on why different techniques may be helpful. She feels like she empties completely and does not want to attempt any intervention to alter this even though we discussed that incomplete emptying is likely contributing to fecal incontinence. She deferred internal pelvic floor exam, vaginally and rectally. We were able to palpate externally for pelvic floor contraction. Believe that she has not been performing contractions correctly as it was largely hamstring and gluteal contraction. With verbal and tactile feedback, she was able to make corrections, but does not feel confident with  being able to feel if she is performing correctly. We removed all exercises from pelvic floor PT HEP except for long holds and quick flicks in order to help simplify things and work on correct pelvic floor contraction coordination. She was agreeable to this plan. She will continue to benefit from skilled physical therapy intervention in order to reduce fecal incontinence and completely empty bowels.   OBJECTIVE IMPAIRMENTS: decreased coordination, decreased endurance, decreased ROM, decreased strength, impaired tone, and postural dysfunction.   ACTIVITY LIMITATIONS: lifting, bending, standing, sleeping, continence, toileting, and locomotion level  PARTICIPATION LIMITATIONS: community activity  PERSONAL FACTORS: Age, Past/current experiences, and 1-2 comorbidities: 4 vaginal deliveries, scolosis  are also affecting patient's functional outcome.   REHAB POTENTIAL: Good  CLINICAL DECISION MAKING: Evolving/moderate complexity  EVALUATION COMPLEXITY: Moderate   GOALS: Goals reviewed with patient? Yes  SHORT TERM GOALS: Target date: 05/20/23 - updated 05/06/23  Ind with initial HEP Baseline: Goal status: MET 05/06/23  2.  Ind with urge and toileting techniques Baseline:  Goal status: IN PROGRESS  LONG TERM GOALS: Target date: 07/15/23 - updated 05/06/23  Pt will report having feeling of when there is stool in the anal canal (can feel it coming out) Baseline:  Goal status: IN PROGRESS  2.  Pt will be independent with advanced HEP to maintain improvements made throughout therapy  Baseline:  Goal status: IN PROGRESS  3.  Pt will report less pain and itchiness of the vulva due to improved skin care Baseline:  Goal status: IN PROGRESS  4.  Pt will be able to contract and hold pelvic floor for at least 10 seconds in order to reduce feeling of urgency. Baseline:  Goal status: IN PROGRESS   PLAN:  PT FREQUENCY: 1x/week  PT DURATION: 12 weeks  PLANNED INTERVENTIONS: Therapeutic  exercises, Therapeutic activity, Neuromuscular re-education, Balance training, Gait training, Patient/Family education, Self Care, Joint mobilization, Dry Needling, Electrical stimulation, Cryotherapy, Moist heat, Taping, Traction, Biofeedback, Manual therapy, and Re-evaluation  PLAN FOR NEXT SESSION: Continue pelvic floor contraction training. Incorporate hip strengthening again. Start core strengthening.   Julio Alm, PT, DPT09/05/242:44 PM

## 2023-06-11 ENCOUNTER — Ambulatory Visit: Payer: Medicare Other

## 2023-06-11 DIAGNOSIS — M6281 Muscle weakness (generalized): Secondary | ICD-10-CM | POA: Diagnosis not present

## 2023-06-11 DIAGNOSIS — R293 Abnormal posture: Secondary | ICD-10-CM

## 2023-06-11 DIAGNOSIS — R279 Unspecified lack of coordination: Secondary | ICD-10-CM

## 2023-06-11 NOTE — Therapy (Signed)
OUTPATIENT PHYSICAL THERAPY FEMALE PELVIC Treatment   Patient Name: CODEY MANKOWSKI MRN: [id number] DOB:[birth year], 89 y.o., female Today's Date: 06/11/2023  END OF SESSION:  PT End of Session - 06/11/23 1229     Visit Number 7    Date for PT Re-Evaluation 07/06/23    Authorization Type medicare - needs KX at visit 15    Progress Note Due on Visit 10    PT Start Time 1230    PT Stop Time 1300    PT Time Calculation (min) 30 min    Activity Tolerance Patient tolerated treatment well    Behavior During Therapy [facility] for tasks assessed/performed               Past Medical History:  Diagnosis Date   Arrhythmia    History of SVT with documented PVC'S and  PAC'S  12/08/12 Nuc stress test normal LV EF 74%  Event Monitor  12/01/12-01/03/13   Atrial flutter (HCC)    Celiac disease    treated by Dr. Kinnie Scales   GERD (gastroesophageal reflux disease)    Hypertension    Intervertebral disc stenosis of neural canal of cervical region    Irregular heart beat 11/30/2012   ECHO-EF 60-65%   Osteoporosis    PMR (polymyalgia rheumatica) (HCC)    Dr. Mallie Mussel; pt states she was diagnosed 10-15 years ago, not treated at this time or any issues that she is aware of.   Scoliosis    Scoliosis    Sleep apnea 10/02/11 [facility]   Sleep study AHI -total sleep 10.3/hr  64.0/ hr during REM sleep.RDI 22.8/hr during total sleep 64.0/hr during REM sleep The lowest O2 sat during Non-REM and REM sleep was 86% and 88% respectively. 04/08/12 CPAP/BIPAP titration study [facility]   Past Surgical History:  Procedure Laterality Date   APPENDECTOMY     ruptured at age 89 and had surgery   CARDIAC CATHETERIZATION  01/27/06   CARDIOVERSION N/A 01/08/2022   Procedure: CARDIOVERSION;  Surgeon: Meriam Sprague, MD;  Location: [facility] ENDOSCOPY;  Service: Cardiovascular;  Laterality: N/A;   cataract surgery  2015   Dr. Elmer Picker; March & April 2015   Patient Active Problem List    Diagnosis Date Noted   Dyspnea 05/30/2023   Pleural effusion 05/30/2023   Radon exposure 05/30/2023   Secondary hypercoagulable state (HCC) 12/12/2021   Hyponatremia 12/04/2021   Acute CHF (congestive heart failure) (HCC) 12/02/2021   Chronic diastolic heart failure (HCC) 12/02/2021   Atrial fibrillation with rapid ventricular response (HCC) 12/02/2021   Essential hypertension 12/02/2021   GERD without esophagitis 12/02/2021   Obstructive sleep apnea 12/02/2021   Interstitial lung disease (HCC) 12/02/2021   History of pulmonary embolism 12/02/2021   Pulmonary embolism (HCC) 11/06/2021   Bronchiectasis (HCC) 11/06/2021   Deviated septum 05/08/2020   Mass of subcutaneous tissue 05/02/2020   Vertigo 06/16/2019   Pulmonary hypertension, unspecified (HCC) 04/14/2019   Ischemic colitis (HCC) 04/14/2019   Migraine with aura and without status migrainosus, not intractable 11/08/2018   Degeneration of lumbar intervertebral disc 10/14/2018   Hoarseness of voice 03/04/2018   Abdominal pain 07/01/2017   Diverticulitis, colon    Metabolic acidosis, increased anion gap    Constipation 04/14/2017   Lumbar hernia 04/14/2017   Presbycusis of both ears 01/10/2017   Tinnitus aurium, bilateral 01/10/2017   Gastroesophageal reflux disease 08/20/2016   Hemoptysis 08/20/2016   Obstructive sleep apnea of adult 08/20/2016   Rhinitis, chronic  08/20/2016   Throat pain in adult 08/20/2016   Fatigue 12/23/2015   Sciatica of right side 10/06/2015   History of migraine headaches 10/06/2015   Frequent PVCs 12/28/2013   Premature atrial contractions 12/28/2013   PSVT (paroxysmal supraventricular tachycardia) (HCC) 12/28/2013   Heart palpitations 07/13/2013   Sleep apnea 04/11/2013   Scoliosis 04/11/2013   Atypical atrial flutter (HCC) 11/29/2012   Chest pain, atypical 11/29/2012   Fibromyalgia syndrome 11/29/2012   Chronic steroid use 11/29/2012    PCP: [name], MD   REFERRING  PROVIDER:    Randa Spike, FNP    REFERRING DIAG: R39.89 (ICD-10-CM) - Other symptoms and signs involving the genitourinary system   THERAPY DIAG:  Muscle weakness (generalized)  Abnormal posture  Unspecified lack of coordination  Rationale for Evaluation and Treatment: Rehabilitation  ONSET DATE: started earlier this year  SUBJECTIVE:                                                                                                                                                                                           SUBJECTIVE STATEMENT: Pt states that she has been using small stool and it helps her feels like she is completely empty. She cannot always feel the bowel movement come out. She is still having episodes of fecal incontinence in the evenings. She feels like this happens when she lets pelvic floor down and stops squeezing. She tries to hold these muscles tight all the time. She tries not to pass gas due to fear of fecal incontinence at the same time.   PAIN:  Are you having pain? Yes NPRS scale: x/10 - not really a pain it is itchy and inflamed and using the A&D and wipes Pain location: External  Pain type: burning Pain description: intermittent   Aggravating factors: just always irritated Relieving factors: not sure  PRECAUTIONS: None  RED FLAGS: None   WEIGHT BEARING RESTRICTIONS: No  FALLS:  Has patient fallen in last 6 months? No  LIVING ENVIRONMENT: Lives with: lives alone Lives in: House/apartment   OCCUPATION: retired - exercise walking with shopping cart  PLOF: Independent  PATIENT GOALS: have less leakage/urgency  PERTINENT HISTORY:  Scoliosis, lung and heart disease, 4 vaginal deliveries   BOWEL MOVEMENT: Pain with bowel movement: No Type of bowel movement:Type (Bristol Stool Scale) solid and normal usually, skinny, Frequency daily, and Strain No Fully empty rectum: Yes: I think so Leakage: Yes:   Pads: Yes: 1 at night and 1  morning unless something happens Fiber supplement: No  URINATION: Pain with urination: No Fully empty bladder: Yes: I don't know Stream:  not  sure Urgency: Yes: more than once a week Frequency: sometimes every hour, sometimes 1-2/day; wakes up due to back and then I just go to the bathroom Leakage: Urge to void and Walking to the bathroom Pads: Yes: 1 in morning and 1 at night   PREGNANCY: Vaginal deliveries 4   PROLAPSE:    OBJECTIVE:  06/04/23: SpO2: 98% HR: 84     DIAGNOSTIC FINDINGS:    PATIENT SURVEYS:    COGNITION: Overall cognitive status: Within functional limits for tasks assessed       GAIT:  Comments: wide BOS  POSTURE:  severe scoliosis  PELVIC ALIGNMENT:  LUMBARAROM/PROM: See ortho notes  LOWER EXTREMITY ROM:  See ortho notes  LOWER EXTREMITY MMT:  See ortho notes PALPATION:                  External Perineal Exam redness of vulva, small amount of stool                             Internal Pelvic Floor - not fully relax in between reps, hold for 2 seconds only, 5 quick flicks with diminishing strength  Patient confirms identification and approves PT to assess internal pelvic floor and treatment Yes  PELVIC MMT:   MMT eval  Vaginal   Internal Anal Sphincter 3/5  External Anal Sphincter 3/5  Puborectalis 3+/5  Diastasis Recti   (Blank rows = not tested)        TONE: Slightly lower  PROLAPSE: Not noted (did not do anterior pelvic assessment)  TODAY'S TREATMENT:                                                                                                                              DATE:  06/11/23 Therapeutic activities: Extensive discussion about what pelvic floor PT can offer her, prognosis, likely causes of what is causing fecal incontinence In depth discussion about anorectal pelvic floor/external anal sphincter exam and what we are hoping to learn by this exam; what treatment for external anal sphincter weakness,  poor coordination, and decreased sensation would look like   06/04/23 Neuromuscular re-education: Pelvic floor contraction training with external feedback Quick flicks Long holds  Therapeutic activities: Importance of complete emptying Internal vaginal or rectal exams to help with coordination of pelvic floor contraction  HEP compliance Training in multiple positions    05/06/23 Neuromuscular re-education: Pelvic floor muscle contractions - practice 10 second holds in different positions  Exercises: Seated hip adduction 2 x 10 ball squeeze Seated hip abduction 2 x 10 yellow band Seated march 2 x 10 Therapeutic activities: Squatty potty problem solving Making sure she is completely emptying bowel Balloon breathing to help empty the bowel, no contractions to help bowel movement come down    PATIENT EDUCATION:  Education details: Access Code: [email or web address] Person educated: Patient Education method: Explanation and Handouts Education comprehension: verbalized understanding  HOME EXERCISE PROGRAM:  Access Code: [email or web address] URL: [email or web address] Date: 04/27/2023 Prepared by: Dwana Curd  Exercises - Clamshell  - 1 x daily - 7 x weekly - 3 sets - 10 reps - Beginner Reverse Clamshell  - 1 x daily - 7 x weekly - 3 sets - 10 reps  [email or web address] new pelvic floor program   ASSESSMENT:  CLINICAL IMPRESSION: Believe that due to ongoing fecal incontinence and pt report of not getting good contractions, internal anorectal exam will be very helpful to fully assess what is going on and to begin treatment to help strengthen external anal sphincter contraction and improve sensation. After discussion and education on this, pt states that she is not comfortable with this today, but she will be willing to perform next treatment session. She is hoping that doctor's appointment the beginning of next week will be helpful in getting answers on what is causing these issues. She was  agreeable to this plan. She will continue to benefit from skilled physical therapy intervention in order to reduce fecal incontinence and completely empty bowels.   OBJECTIVE IMPAIRMENTS: decreased coordination, decreased endurance, decreased ROM, decreased strength, impaired tone, and postural dysfunction.   ACTIVITY LIMITATIONS: lifting, bending, standing, sleeping, continence, toileting, and locomotion level  PARTICIPATION LIMITATIONS: community activity  PERSONAL FACTORS: Age, Past/current experiences, and 1-2 comorbidities: 4 vaginal deliveries, scolosis  are also affecting patient's functional outcome.   REHAB POTENTIAL: Good  CLINICAL DECISION MAKING: Evolving/moderate complexity  EVALUATION COMPLEXITY: Moderate   GOALS: Goals reviewed with patient? Yes  SHORT TERM GOALS: Target date: 05/20/23 - updated 05/06/23 - updated 06/11/23  Ind with initial HEP Baseline: Goal status: MET 05/06/23  2.  Ind with urge and toileting techniques Baseline:  Goal status: IN PROGRESS  LONG TERM GOALS: Target date: 07/15/23 - updated 05/06/23 - updated 06/11/23  Pt will report having feeling of when there is stool in the anal canal (can feel it coming out) Baseline:  Goal status: IN PROGRESS  2.  Pt will be independent with advanced HEP to maintain improvements made throughout therapy  Baseline:  Goal status: IN PROGRESS  3.  Pt will report less pain and itchiness of the vulva due to improved skin care Baseline:  Goal status: IN PROGRESS  4.  Pt will be able to contract and hold pelvic floor for at least 10 seconds in order to reduce feeling of urgency. Baseline:  Goal status: IN PROGRESS   PLAN:  PT FREQUENCY: 1x/week  PT DURATION: 12 weeks  PLANNED INTERVENTIONS: Therapeutic exercises, Therapeutic activity, Neuromuscular re-education, Balance training, Gait training, Patient/Family education, Self Care, Joint mobilization, Dry Needling, Electrical stimulation, Cryotherapy, Moist  heat, Taping, Traction, Biofeedback, Manual therapy, and Re-evaluation  PLAN FOR NEXT SESSION: Internal anorectal exam and treatment.   Julio Alm, PT, DPT09/12/241:14 PM

## 2023-06-15 ENCOUNTER — Other Ambulatory Visit: Payer: Self-pay | Admitting: Cardiovascular Disease

## 2023-06-15 ENCOUNTER — Ambulatory Visit: Payer: Medicare Other | Attending: Family Medicine | Admitting: Physical Therapy

## 2023-06-15 ENCOUNTER — Encounter: Payer: Self-pay | Admitting: Physical Therapy

## 2023-06-15 DIAGNOSIS — R159 Full incontinence of feces: Secondary | ICD-10-CM | POA: Diagnosis not present

## 2023-06-15 DIAGNOSIS — M4125 Other idiopathic scoliosis, thoracolumbar region: Secondary | ICD-10-CM | POA: Diagnosis not present

## 2023-06-15 DIAGNOSIS — R21 Rash and other nonspecific skin eruption: Secondary | ICD-10-CM | POA: Diagnosis not present

## 2023-06-15 DIAGNOSIS — Z6821 Body mass index (BMI) 21.0-21.9, adult: Secondary | ICD-10-CM | POA: Diagnosis not present

## 2023-06-15 DIAGNOSIS — M6281 Muscle weakness (generalized): Secondary | ICD-10-CM | POA: Diagnosis not present

## 2023-06-15 DIAGNOSIS — R293 Abnormal posture: Secondary | ICD-10-CM | POA: Insufficient documentation

## 2023-06-15 DIAGNOSIS — L509 Urticaria, unspecified: Secondary | ICD-10-CM | POA: Diagnosis not present

## 2023-06-15 NOTE — Therapy (Signed)
OUTPATIENT PHYSICAL THERAPY FEMALE PELVIC Treatment   Patient Name: Janet Nguyen MRN: [id number] DOB:05-24-1932, 89 y.o., female Today's Date: 06/15/2023  END OF SESSION:  PT End of Session - 06/15/23 1236     Visit Number 8    Date for PT Re-Evaluation 07/06/23    Authorization Type medicare - needs KX at visit 15    Progress Note Due on Visit 10    PT Start Time 1234    PT Stop Time 1317    PT Time Calculation (min) 43 min    Activity Tolerance Patient tolerated treatment well    Behavior During Therapy [facility] for tasks assessed/performed                Past Medical History:  Diagnosis Date   Arrhythmia    History of SVT with documented PVC'S and  PAC'S  12/08/12 Nuc stress test normal LV EF 74%  Event Monitor  12/01/12-01/03/13   Atrial flutter (HCC)    Celiac disease    treated by Dr. Kinnie Scales   GERD (gastroesophageal reflux disease)    Hypertension    Intervertebral disc stenosis of neural canal of cervical region    Irregular heart beat 11/30/2012   ECHO-EF 60-65%   Osteoporosis    PMR (polymyalgia rheumatica) (HCC)    Dr. Mallie Mussel; pt states she was diagnosed 10-15 years ago, not treated at this time or any issues that she is aware of.   Scoliosis    Scoliosis    Sleep apnea 10/02/11 [facility]   Sleep study AHI -total sleep 10.3/hr  64.0/ hr during REM sleep.RDI 22.8/hr during total sleep 64.0/hr during REM sleep The lowest O2 sat during Non-REM and REM sleep was 86% and 88% respectively. 04/08/12 CPAP/BIPAP titration study [facility]   Past Surgical History:  Procedure Laterality Date   APPENDECTOMY     ruptured at age 31 and had surgery   CARDIAC CATHETERIZATION  01/27/06   CARDIOVERSION N/A 01/08/2022   Procedure: CARDIOVERSION;  Surgeon: Meriam Sprague, MD;  Location: [facility] ENDOSCOPY;  Service: Cardiovascular;  Laterality: N/A;   cataract surgery  2015   Dr. Elmer Picker; March & April 2015   Patient Active Problem  List   Diagnosis Date Noted   Dyspnea 05/30/2023   Pleural effusion 05/30/2023   Radon exposure 05/30/2023   Secondary hypercoagulable state (HCC) 12/12/2021   Hyponatremia 12/04/2021   Acute CHF (congestive heart failure) (HCC) 12/02/2021   Chronic diastolic heart failure (HCC) 12/02/2021   Atrial fibrillation with rapid ventricular response (HCC) 12/02/2021   Essential hypertension 12/02/2021   GERD without esophagitis 12/02/2021   Obstructive sleep apnea 12/02/2021   Interstitial lung disease (HCC) 12/02/2021   History of pulmonary embolism 12/02/2021   Pulmonary embolism (HCC) 11/06/2021   Bronchiectasis (HCC) 11/06/2021   Deviated septum 05/08/2020   Mass of subcutaneous tissue 05/02/2020   Vertigo 06/16/2019   Pulmonary hypertension, unspecified (HCC) 04/14/2019   Ischemic colitis (HCC) 04/14/2019   Migraine with aura and without status migrainosus, not intractable 11/08/2018   Degeneration of lumbar intervertebral disc 10/14/2018   Hoarseness of voice 03/04/2018   Abdominal pain 07/01/2017   Diverticulitis, colon    Metabolic acidosis, increased anion gap    Constipation 04/14/2017   Lumbar hernia 04/14/2017   Presbycusis of both ears 01/10/2017   Tinnitus aurium, bilateral 01/10/2017   Gastroesophageal reflux disease 08/20/2016   Hemoptysis 08/20/2016   Obstructive sleep apnea of adult 08/20/2016   Rhinitis,  chronic 08/20/2016   Throat pain in adult 08/20/2016   Fatigue 12/23/2015   Sciatica of right side 10/06/2015   History of migraine headaches 10/06/2015   Frequent PVCs 12/28/2013   Premature atrial contractions 12/28/2013   PSVT (paroxysmal supraventricular tachycardia) (HCC) 12/28/2013   Heart palpitations 07/13/2013   Sleep apnea 04/11/2013   Scoliosis 04/11/2013   Atypical atrial flutter (HCC) 11/29/2012   Chest pain, atypical 11/29/2012   Fibromyalgia syndrome 11/29/2012   Chronic steroid use 11/29/2012    PCP: [name], MD   REFERRING  PROVIDER:    Randa Spike, FNP    REFERRING DIAG: R39.89 (ICD-10-CM) - Other symptoms and signs involving the genitourinary system   THERAPY DIAG:  Muscle weakness (generalized)  Abnormal posture  Other idiopathic scoliosis, thoracolumbar region  Rationale for Evaluation and Treatment: Rehabilitation  ONSET DATE: started earlier this year  SUBJECTIVE:                                                                                                                                                                                           SUBJECTIVE STATEMENT: I am miserable today. Rt knee pain, itching everywhere.  Not sure why.  PAIN:  Are you having pain? Yes NPRS scale: can't express a number - everything hurts today ? /10  Pain location: External  Pain type: burning Pain description: intermittent   Aggravating factors: just always irritated Relieving factors: not sure  PRECAUTIONS: None  RED FLAGS: None   WEIGHT BEARING RESTRICTIONS: No  FALLS:  Has patient fallen in last 6 months? No  LIVING ENVIRONMENT: Lives with: lives alone Lives in: House/apartment   OCCUPATION: retired - exercise walking with shopping cart  PLOF: Independent  PATIENT GOALS: have less leakage/urgency  PERTINENT HISTORY:  Scoliosis, lung and heart disease, 4 vaginal deliveries   BOWEL MOVEMENT: Pain with bowel movement: No Type of bowel movement:Type (Bristol Stool Scale) solid and normal usually, skinny, Frequency daily, and Strain No Fully empty rectum: Yes: I think so Leakage: Yes:   Pads: Yes: 1 at night and 1 morning unless something happens Fiber supplement: No  URINATION: Pain with urination: No Fully empty bladder: Yes: I don't know Stream:  not sure Urgency: Yes: more than once a week Frequency: sometimes every hour, sometimes 1-2/day; wakes up due to back and then I just go to the bathroom Leakage: Urge to void and Walking to the bathroom Pads: Yes: 1 in  morning and 1 at night   PREGNANCY: Vaginal deliveries 4   PROLAPSE:    OBJECTIVE:  06/04/23: SpO2: 98% HR: 84     DIAGNOSTIC FINDINGS:  PATIENT SURVEYS:    COGNITION: Overall cognitive status: Within functional limits for tasks assessed       GAIT:  Comments: wide BOS  POSTURE:  severe scoliosis  PELVIC ALIGNMENT:  LUMBARAROM/PROM: See ortho notes  LOWER EXTREMITY ROM:  See ortho notes  LOWER EXTREMITY MMT:  See ortho notes PALPATION:                  External Perineal Exam redness of vulva, small amount of stool                             Internal Pelvic Floor - not fully relax in between reps, hold for 2 seconds only, 5 quick flicks with diminishing strength  Patient confirms identification and approves PT to assess internal pelvic floor and treatment Yes  PELVIC MMT:   MMT eval  Vaginal   Internal Anal Sphincter 3/5  External Anal Sphincter 3/5  Puborectalis 3+/5  Diastasis Recti   (Blank rows = not tested)        TONE: Slightly lower  PROLAPSE: Not noted (did not do anterior pelvic assessment)  TODAY'S TREATMENT:                                                                                                                              DATE:  06/15/23: NuStep L5 x 3' - Pt limited by SOB and fatigue and disinterest in exercise Rt SL STM to thoracic and lumbar paraspinals, Lt QL, Lt obliques, Lt lat, intrascapular Supine: SKTC, DKTC, HS stretch with ankle pumps and circles, fig 4 stretch, LTR, bridge x10 Seated bil upper quadrant broadening  06/11/23 Therapeutic activities: Extensive discussion about what pelvic floor PT can offer her, prognosis, likely causes of what is causing fecal incontinence In depth discussion about anorectal pelvic floor/external anal sphincter exam and what we are hoping to learn by this exam; what treatment for external anal sphincter weakness, poor coordination, and decreased sensation would look  like   06/04/23 Neuromuscular re-education: Pelvic floor contraction training with external feedback Quick flicks Long holds  Therapeutic activities: Importance of complete emptying Internal vaginal or rectal exams to help with coordination of pelvic floor contraction  HEP compliance Training in multiple positions    05/06/23 Neuromuscular re-education: Pelvic floor muscle contractions - practice 10 second holds in different positions  Exercises: Seated hip adduction 2 x 10 ball squeeze Seated hip abduction 2 x 10 yellow band Seated march 2 x 10 Therapeutic activities: Squatty potty problem solving Making sure she is completely emptying bowel Balloon breathing to help empty the bowel, no contractions to help bowel movement come down    PATIENT EDUCATION:  Education details: Access Code: [email or web address] Person educated: Patient Education method: Explanation and Handouts Education comprehension: verbalized understanding  HOME EXERCISE PROGRAM: Access Code: [email or web address] URL: [email or web address] Date: 04/27/2023 Prepared by: Dwana Curd  Exercises - Clamshell  - 1 x daily -  7 x weekly - 3 sets - 10 reps - Beginner Reverse Clamshell  - 1 x daily - 7 x weekly - 3 sets - 10 reps  [email or web address] new pelvic floor program   ASSESSMENT:  CLINICAL IMPRESSION: Pt arrived "feeling miserable" and reporting itching everywhere and some Rt knee pain.  She expresses disinterest in working on cardiovascular endurance but knows she needs it.  She was able to warm up on NuStep x 3' today but was limited by SOB.  She continues to express benefit from [facility] to back and neck soft tissues surrounding chronic degenerative scoliosis.  She sees her GP this afternoon and hopes to talk to him about source of itching.  OBJECTIVE IMPAIRMENTS: decreased coordination, decreased endurance, decreased ROM, decreased strength, impaired tone, and postural dysfunction.   ACTIVITY LIMITATIONS: lifting,  bending, standing, sleeping, continence, toileting, and locomotion level  PARTICIPATION LIMITATIONS: community activity  PERSONAL FACTORS: Age, Past/current experiences, and 1-2 comorbidities: 4 vaginal deliveries, scolosis  are also affecting patient's functional outcome.   REHAB POTENTIAL: Good  CLINICAL DECISION MAKING: Evolving/moderate complexity  EVALUATION COMPLEXITY: Moderate   GOALS: Goals reviewed with patient? Yes  SHORT TERM GOALS: Target date: 05/20/23 - updated 05/06/23 - updated 06/11/23  Ind with initial HEP Baseline: Goal status: MET 05/06/23  2.  Ind with urge and toileting techniques Baseline:  Goal status: IN PROGRESS  LONG TERM GOALS: Target date: 07/15/23 - updated 05/06/23 - updated 06/11/23  Pt will report having feeling of when there is stool in the anal canal (can feel it coming out) Baseline:  Goal status: IN PROGRESS  2.  Pt will be independent with advanced HEP to maintain improvements made throughout therapy  Baseline:  Goal status: IN PROGRESS  3.  Pt will report less pain and itchiness of the vulva due to improved skin care Baseline:  Goal status: IN PROGRESS  4.  Pt will be able to contract and hold pelvic floor for at least 10 seconds in order to reduce feeling of urgency. Baseline:  Goal status: IN PROGRESS   PLAN:  PT FREQUENCY: 1x/week  PT DURATION: 12 weeks  PLANNED INTERVENTIONS: Therapeutic exercises, Therapeutic activity, Neuromuscular re-education, Balance training, Gait training, Patient/Family education, Self Care, Joint mobilization, Dry Needling, Electrical stimulation, Cryotherapy, Moist heat, Taping, Traction, Biofeedback, Manual therapy, and Re-evaluation  PLAN FOR NEXT SESSION: Internal anorectal exam and treatment.   Amias Hutchinson, PT 06/15/23 1:17 PM

## 2023-06-19 ENCOUNTER — Ambulatory Visit: Payer: Medicare Other | Attending: Cardiovascular Disease | Admitting: Cardiovascular Disease

## 2023-06-19 ENCOUNTER — Telehealth: Payer: Self-pay | Admitting: Cardiovascular Disease

## 2023-06-19 ENCOUNTER — Encounter: Payer: Self-pay | Admitting: Cardiovascular Disease

## 2023-06-19 VITALS — BP 114/72 | HR 90 | Ht 61.0 in | Wt 113.0 lb

## 2023-06-19 DIAGNOSIS — Z7901 Long term (current) use of anticoagulants: Secondary | ICD-10-CM | POA: Insufficient documentation

## 2023-06-19 DIAGNOSIS — I480 Paroxysmal atrial fibrillation: Secondary | ICD-10-CM | POA: Diagnosis not present

## 2023-06-19 DIAGNOSIS — Z86711 Personal history of pulmonary embolism: Secondary | ICD-10-CM | POA: Insufficient documentation

## 2023-06-19 DIAGNOSIS — M4125 Other idiopathic scoliosis, thoracolumbar region: Secondary | ICD-10-CM | POA: Diagnosis not present

## 2023-06-19 DIAGNOSIS — I340 Nonrheumatic mitral (valve) insufficiency: Secondary | ICD-10-CM | POA: Diagnosis not present

## 2023-06-19 DIAGNOSIS — J479 Bronchiectasis, uncomplicated: Secondary | ICD-10-CM | POA: Insufficient documentation

## 2023-06-19 DIAGNOSIS — N3020 Other chronic cystitis without hematuria: Secondary | ICD-10-CM | POA: Insufficient documentation

## 2023-06-19 DIAGNOSIS — I4819 Other persistent atrial fibrillation: Secondary | ICD-10-CM | POA: Insufficient documentation

## 2023-06-19 NOTE — Progress Notes (Unsigned)
50,000 patient ID: Janet Nguyen, female   DOB: 1932/05/16, 89 y.o.   MRN: [id number]      Primary  M.D.: Dr. Dorthey Sawyer  HPI: Janet Nguyen is a 89 y.o. female who presents for a 6 month followup cardiology evaluation.  Janet Nguyen has a history of documented SVT and also has PACs and PVCs  treated with beta blocker therapy. In April 2014 I further titrated her beta blocker therapy after cardiac event monitor revealed several bursts of recurrent SVT up to 177 beats per minute in March.  In July after she had had 2 episodes of chest fluttering which each lasted over 30 minutes which he did take metoprolol tartrate with relief I recommended further titration of her Toprol to 75 mg in the morning and 50 mg at night and if necessary she could further titrate this to 75 twice a day.  She has a history of obstructive sleep apnea but despite multiple attempts at CPAP utilization she has not been able to tolerate this. She was referred to Dr. [name] and has a customized dental appliance with mandibular advancement with improvement in some of her symptomatology. Due concern that her teeth may be moving in more recently she has has not been using customized appliance daily but has been using her old non-customized mouthguard.  At times shen has awakened abruptly from a dream with her heart pounding and a sensation of hot flashes, gasping for breath.   She also states that her scoliosis is getting worse. She does have left-sided musculoskeletal type chest pain due to her spine angulation.  Beause of her significant scoliosis, she feels she must sleep on her back.   She has a history of GERD for which she has been taking Nexium.  She presents for evaluation.  I had scheduled her for an overnight oximetry to see if she is a candidate for supplemental oxygen at nighttime since she refused to use CPAP and only very rarely uses her customized mouthpiece.  She does wear a mouthguard to reduce  bruxism.  Oximetry study was performed overnight on February 23/24.  Her mean oxygen saturation was 93.12%.  She spent 12 minutes and 16 seconds with O2 sat duration below 88% with the lowest O2 sat duration of 80%. I tried to set her up for supplemental oxygen at bedtime.  However, she has been denied for this on multiple occasions by her insurance/Medicare.  She feels that she is sleeping better.  She can only sleep in her back due to scoliosis.  Her left side is concave; her right side is convex.  She has had issues with labile blood pressure. She has had issues with lower back discomfort and sciatica leading to emergency room evaluation in November 2016.  She saw Dr. [name] for neurosurgical evaluation. She has migraine headaches. She  Recently saw Dr. [name] for these migraine headaches.  She has been on Toprol-XL 75 mg twice a day for her palpitations and presently denies any awareness an extra heartbeats. She takes Xanax on an as-needed basis for anxiety.  She has been taking Nexium 20, no grams every other day for GERD. She states her scoliosis is getting worse.  Has been experiencing more fatigue.  She also notes some occasional left hand numbness.  In a mouth guard but no ureteral longer uses her mandibular advancement device and not tolerate CPAP therapy for her obstructive sleep apnea.    She had experienced left-sided chest discomfort which most likely  was related to her significant scoliosis the potential neuropathy causing intermittent left arm and hand numbness.  I slightly reduced her Toprol-XL which she had been taking 150 mg daily and a 75 twice a day regimen to 75 and milligrams in the morning and 50 mg with ultimate plan to decrease this to 50 twice a day.  She states when she reduce this, she began to notice more optical migraines and resume taking the higher dose Toprol at 75 twice a day with improvement.  She has seen Dr. [name] for her paresthesias.  She admits to significant anxiety.   She had requested zolpidem for sleep initiation and maintenance.  In the past, we had tried 6.25 slow-release version to help with sleep maintenance, but due to cost issues preferred the 5 mg sleep initiation dose.  She experiences optical migraine headaches.  She was being seen by neurologist, Dr. [name]  She continues to have issues with her scoliosis causing discomfort in her neck and arm due to her distortion.  He tells me she underwent endoscopy and colonoscopy as well as banding of hemorrhoids.  She had a benign polyp removed.  She has issues with spastic colon.  Her sleep is better with zolpiden 5 mg.  She saw [name] on 11/13/2016.  She had noticed that her hair was "thinning" and was concerned about this being the result of Toprol.  He suggested possibly decreasing Toprol but she preferred not until she had seen me.  Since that time, she continues to experience some optical migraines.  She has noticed some leg left neck discomfort.  She denies any patches of hair loss.  She denies differential arm weakness.  She continues to have difficulty with her scoliosis with hip pain and rib cage discomfort.   When I saw her in March 2018, there was a blood pressure differential of 118/78 in the left arm and 140/80 in the right arm.  She subsequently underwent upper extremity Doppler evaluation on 12/25/2016.  She was noted have mild heterogeneous plaque with stable 1-39% bilateral internal carotid stenoses.  She had normal subclavian arteries bilaterally.  There are patent vertebral arteries with antegrade flow.  She did not have any significant blood pressure differential and had triphasic waveforms in both her right and biphasic in her left brachial artery.  Left blood pressure was 8 mm higher than the right brachial pressure.  At times, she continues to experience some vague chest wall symptoms, which most likely related to her posture.  She denies significant shortness of breath.  She has to sleep on  her back because of her scoliosis.  Uses a chin strap to prevent oral breathing.  She is not on CPAP.   In June 2019 she had concerns about some of her medications possibly causing hair loss or memory loss.  She admits to having dry eyes.  She continues to have difficulty from her scolWsaw her in November 2019 at which time she,  felt well from a cardiac standphk and apparently had a new oral appliance made.  She used this initially but then started to notice some teeth discomfort.  Apparently this again was treated by Dr.e still admits to being tired.  She has issues with her abdomen being tight which she believes is related to her scoliosis.  Her GERD is controlled with pantoprazole.    She was seen by Dr. Dorthey Sawyer for her primary care.  She had developed a fungal infection of her toenail.  He had  given her to Fries fine 20 mg.  She has not started this and was hesitant to do this due to potential interaction with metoprolol.  She also was evaluated by Dr. Vickey Huger.  According to Ms. Vea she did not evaluate her ophthalmic migraines.  However she was planning to do a possible home study to reassess her sleep apnea.  In the past she did not tolerate any CPAP therapy and although initially tolerated customized oral appliance this seemed to cause difficulty with movement of her teeth.  She has been using the CALM app on her smart phone to help with relaxation and improve sleep and is chronically dependent on low-dose zolpidem prescribed by Dr. Tenny Craw.  She denies chest pain.  Her palpitations are well controlled with her current dose of metoprolol 75 mils twice a day and she can to be on low-dose amlodipine 2.5 mg.    Since I saw her in June 2020, she underwent the home study by Dr. Villa Herb was done after several studies with no data.  According to Dr. Oliva Bustard note, AHI was 26.5 which was moderate and during REM sleep AHI rose to 34.9.  Snoring was not recorded.  Apparently there was discussion of  possible Occupational psychologist.  Janet Nguyen is not interested.  I saw her in October 2020 at which time she had continued issues with migraines and some vertigo for which he takes the Epley maneuver.  She continued to have discomfort related to her scoliosis with muscle discomfort on her chest.  She believes she is waking up during dreaming and not sleeping well.  Her blood pressure has increased.  At her prior evaluation, I had instituted low-dose amlodipine at 2.5 mg.  With her blood pressure elevated during that evaluation I further titrated this to 5 mg I recommended she continue taking Toprol XL 75 mg twice a day.  She did not have any episodes of palpitations or recurrent SVT.  I saw her in January 2021. She had seen  Dr. Lucia Gaskins of neurology.  She continued to experience some muscular neck aches which may be related to her scoliosis but do not sound ischemic in etiology.  She states she has some varicose veins in the right lower extremity and complains of trivial ankle swelling.  She denies palpitations.    I saw her in April 2021.  Over the previous several months she had felt well from a cardiovascular standpoint.  She continues to have issues with her scoliosis causing back issues as well as some left-sided discomfort along her rib cage.   She has sleep apnea and remotely did not tolerate CPAP or an oral appliance  She continues to be on amlodipine 5 mg daily, metoprolol succinate 75 mg twice a day both for blood pressure and her palpitations. GERD is fairly well controlled on pantoprazole. She continues to be on Librax which she states does offer improvement and she takes alprazolam on a as needed basis.  I saw her in October 2021 and since her prior evaluation she had undergone neurology evaluation by Dr. Everlena Cooper and has had migraine issues with aura since young adulthood.  An MRI of the brain in 2017 showed mild chronic small vessel ischemic changes otherwise was unremarkable.  She had developed  swelling in her leg right greater than left and has varicose veins.  She underwent lower extremity venous study which revealed no DVT but there was evidence for venous reflux in the right saphenofemoral junction, right greater saphenous vein in the  thigh and right greater saphenous vein in the calf.  She has used compression stockings in the past.  There was no plan for intervention.  She has been undergoing physical therapy.  There is no change in her intermittent rib discomfort from her scoliosis.  She denies palpitations.    I saw her in May 2022.  Over the prior 6 months she continued to have issues with her rib cage and hip bone discomfort contributed by her scoliosis.  She has had issues with varicose veins.  She has to sleep on her side.  She was recently evaluated by Dr. Shirline Frees for elevated monocytes who felt she was stable hematologically.  She denies any angina.  She is unaware of palpitations and continues to be on Toprol all succinate 75 mg twice a day.  She is also on amlodipine 5 mg for hypertension.  She has a prescription for alprazolam and rarely if ever takes this for anxiety.    When I saw her on October 10, 2021 she was continuing to have issues resulting from her scoliosis.  She was recently evaluated at [facility] and a chest CT revealed scattered areas of focal bronchiectasis and clustered nodules in the lower lungs, most pronounced in the right middle lobe and lingula.  There also was a part solid nodule of the right lower lobe measuring 7.5 mm in mean diameter with 4 mm solid component.  A follow-up noncontrast CT was recommended in 3 to 6 months.  She was experiencing increasing shortness of breath with activity and denied any anginal symptoms.  Since I saw her, she was evaluated by Dr. Marchelle Gearing in follow-up of her chest CT for pulmonary evaluation of her bronchiectasis and shortness of breath.  Laboratory revealed an elevated D-dimer.  Subsequent VQ scan on October 25, 2021 was positive for PE.  A 2D echo Doppler study showed mildly elevated pulmonary artery pressure 38 mm with grade 2 diastolic dysfunction, severe left atrial enlargement with moderate right atrial enlargement, and moderately severe MR.  Venous Doppler was negative for DVT.  She was started on Eliquis and Spiriva.  She had pulmonary follow-up on February 3 and was last seen by Dr. Marchelle Gearing on November 28, 2021.  She was hospitalized on March 6 through December 04, 2021 with increased palpitations and was found to be in atrial fibrillation.  On presentation sodium was 126 potassium 5.2.  Troponins were negative.  Chest x-ray showed cardiomegaly with bilateral atelectasis and small bilateral pleural effusions.  She was started on IV diuresis.  She was on beta-blocker with metoprolol succinate 75 mg and diltiazem was added for rate control.  Follow-up echo showed EF 60 to 65% with moderate dilation of bilateral atriums.  There is a small circumferential pericardial effusion.  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  During hospitalization she was seen by Dr. Shari Prows of cardiology.  She was discharged on December 04, 2021.  I saw her in follow-up of her hospitalization on December 18, 2021.  At that time, she felt somewhat improved.  However, her ECG revealed atrial fibrillation at 94 bpm.  She was less tachycardic.  She continues to have issues with her scoliosis.  She is on medical regimen of Eliquis 5 mg twice a day, metoprolol succinate 75 mg daily, diltiazem CD1 120 mg daily, furosemide 20 mg, as well as Spiriva.  During that evaluation, I  recommended further titration of her metoprolol succinate to 100 mg daily and that she take this in a 50 twice daily regimen for more stable blood pressure.  She continues to be on Eliquis for anticoagulation.  I schedule her for outpatient cardioversion.  Janet Nguyen  underwent DC cardioversion by Dr. Shari Prows on January 08, 2022.  She was successfully cardioverted.  Subsequently, she saw Dr. Marchelle Gearing on January 24, 2022.  She apparently was to be on Spiriva and the flutter valve but apparently was not doing this.  I saw her for follow-up on Jan 29, 2022.  At that time she stated her blood pressure had been typically in the 110-130 systolic range.  She had noticed heart rate irregularity for the last 1 to 2 weeks.  Her ECG revealed that she is back in atrial fibrillation with coarse fibrillatory waves with ventricular rate at 97.  At that time I discussed options with the patient.  With her age of 89 years old I recommended further titration of diltiazem to 180 mg daily from her present dose of 120 mg.  I did not feel she was a candidate for amiodarone in light of her recent pulmonary history and recommended she be evaluated in the atrial fibrillation clinic for consideration of possible initiation of other antiarrhythmic treatment.  She was scheduled to have a follow-up chest CT in the future with Dr. Marchelle Gearing.  She was evaluated by Alphonzo Severance on Feb 04, 2022.  At that time there was significant discussion concerning possible admission for dofetilide and reportedly she was agreeable.  He recommended that she continue diltiazem 180 mg daily, Toprol 50 mg twice a day, and Eliquis 5 mg twice a day.  Apparently, subsequently Janet Nguyen had real concerns about the hospitalization and canceled the tentatively scheduled admission.    I saw her on March 03, 2022 for follow-up cardiology evaluation.  She had reduced her diltiazem back to the 120 mg dose since she felt the 180 mg dose was causing stomach upset.  She admitted to be under significant stress trying to figure out all her health issues.  She had significant reservations about Tikosyn loading.  During that evaluation we discussed options and felt with her age rate control strategy may be most appropriate.  However in order to  improve heart rate response I added digoxin at 0.0625 mg.  I saw her on April 30, 2022.  Since her prior evaluation since she underwent pulmonary evaluation with Dr. Marchelle Gearing.  She was not satisfied with that appointment and remains frustrated.  Her CT scan had been done which showed improvement in prior lung nodule.  She was using flutter valve for her bronchiectasis.  Presently, she denies any chest pain.  She has had issues with leg swelling right greater than left and has issues with varicose veins.  With her progressive scoliosis she notes some discomfort from compression of her stomach.  She admits to some itching.  She still experiences some shortness of breath.  Her CT scan did not show any recurrent PE a pulmonary trunk was enlarged indicative of pulmonary artery hypertension.  Laboratory from July 20 showed BNP elevated at 870.  D-dimer was mildly increased at 0.73, improved from January 2023.  During that evaluation she had progressive lower extremity edema and was taking furosemide 20 mg daily.  I suggested she increase furosemide to 40 mg in the morning until the edema improved then try alternating 40 with 20 every other day.  Since I saw her,  she has been evaluated by Micah Flesher on September 7 and most recently on July 24, 2022.  Her swelling had improved.  She was concerned about Eliquis bleed risk.  She was not having chest pain.  I saw her on September 15, 2022.  At that time she was having progressive lower extremity edema bilaterally particularly most prominent in the right lower extremity up to the knees.  She has been on Eliquis now at the reduced dose of 2.5 mg twice a day, diltiazem 120 daily.  She has been taking furosemide 40 mg instead of alternating with 20 mg.  She is on metoprolol succinate 75 mg twice a day, long-acting diltiazem 120 mg and digoxin 0.0625 mg daily for additional rate control.  She takes Spiriva inhalation.  During that evaluation, 33+ edema on the right and 2+  on the left I recommended she discontinue furosemide and substituted this with torsemide.  Initially she was to take 40 mg in the morning and 20 mg in the afternoon and depending upon results can change to 20 twice a day.  She was now on a reduced dose of Eliquis.  I last saw her on December 22, 2022. Since her prior evaluation  Janet Nguyen has felt well.  Swelling significantly resolved with change to torsemide.  Presently, she is taking metoprolol succinate 75 mg twice a day, diltiazem CD120 daily, digoxin 0.125 mg daily in addition to reduced dose Eliquis 2.5 twice daily.  He continues to take torsemide now 20 mg in the morning and 10 mg in the evening.  At time she admits that she gets anxious and her heart rate speeds up.  Denies any chest pressure.  She saw Dr. Marchelle Gearing for follow-up on October 30, 2022.  She has continued using Spiriva for bronchiectasis and exertional dyspnea.  In 2023, PFTs were  felt to be restrictive expected with her degree of scoliosis.  The DLCO had mild/moderate reduction in lung volumes were slightly reduced on prior evaluation in 2023.    She underwent an echo on April 07, 2023 which demonstrated EF at 55 to 60% without wall motion abnormalities.  Moderate elevation of pulmonary artery systolic pressure with estimated RV systolic pressure at 51 mm.  She had severe biatrial enlargement.  She had a myxomatous mitral valve with moderately severe mitral regurgitation.  There was moderate holosystolic prolapse of her middle scallop of the posterior leaflet.  There was mild to moderate TR.  Her aortic valve was sclerotic without stenosis.  Moderate pulmonic regurgitation.  She was evaluated by Nils Pyle, NP on April 20, 2023.  She was in atrial fibrillation with a ventricular rate at approximately 104.  She was on metoprolol, diltiazem, digoxin, and Eliquis.  Since I last saw her, she has continued to be on reduced dose Eliquis at 2.5 mg twice a day.  Her ventricular rate has  been controlled with digoxin 0.  Over 65 mg daily, diltiazem CD1 120 mg and she has been taking metoprolol succinate 100 mg in the morning and 75 mg at night.  She takes torsemide 20 g twice a day and has not had any significant edema.  She continues to be on Spiriva..  Apparently she recently had been treated with Augmentin for recurrent chronic cystitis and hematuria and this was discontinued secondary to a rash.  She denies any chest pain.  She denies presyncope or syncope.  She presents for reevaluation.  Past Medical History:  Diagnosis Date   Arrhythmia  History of SVT with documented PVC'S and  PAC'S  12/08/12 Nuc stress test normal LV EF 74%  Event Monitor  12/01/12-01/03/13   Atrial flutter (HCC)    Celiac disease    treated by Dr. Kinnie Scales   GERD (gastroesophageal reflux disease)    Hypertension    Intervertebral disc stenosis of neural canal of cervical region    Irregular heart beat 11/30/2012   ECHO-EF 60-65%   Osteoporosis    PMR (polymyalgia rheumatica) (HCC)    Dr. Mallie Mussel; pt states she was diagnosed 10-15 years ago, not treated at this time or any issues that she is aware of.   Scoliosis    Scoliosis    Sleep apnea 10/02/11 [facility]   Sleep study AHI -total sleep 10.3/hr  64.0/ hr during REM sleep.RDI 22.8/hr during total sleep 64.0/hr during REM sleep The lowest O2 sat during Non-REM and REM sleep was 86% and 88% respectively. 04/08/12 CPAP/BIPAP titration study [facility]    Past Surgical History:  Procedure Laterality Date   APPENDECTOMY     ruptured at age 43 and had surgery   CARDIAC CATHETERIZATION  01/27/06   CARDIOVERSION N/A 01/08/2022   Procedure: CARDIOVERSION;  Surgeon: Meriam Sprague, MD;  Location: [facility] ENDOSCOPY;  Service: Cardiovascular;  Laterality: N/A;   cataract surgery  2015   Dr. Elmer Picker; March & April 2015    Allergies  Allergen Reactions   Gluten Meal Other (See Comments)    Unknown   Naproxen Other  (See Comments)    Stomach upset    Current Outpatient Medications  Medication Sig Dispense Refill   acetaminophen (TYLENOL) 500 MG tablet Take 500 mg by mouth every 8 (eight) hours as needed for moderate pain.     albuterol (VENTOLIN HFA) 108 (90 Base) MCG/ACT inhaler Inhale 2 puffs into the lungs every 4 (four) hours as needed.     Aloe-Sodium Chloride (AYR SALINE NASAL GEL NA) Place 1 application  into the nose daily as needed (rhinitis).     ALPRAZolam (XANAX) 0.5 MG tablet Take 0.25-0.5 mg by mouth 2 (two) times daily as needed for anxiety.     apixaban (ELIQUIS) 2.5 MG TABS tablet Take 2.5 mg by mouth 2 (two) times daily.     Biotin w/ Vitamins C & E (HAIR SKIN & NAILS GUMMIES PO) Take 1 tablet by mouth daily.     Calcium Carb-Ergocalciferol (CHEWABLE CALCIUM/D PO) Take 1 each by mouth daily.     clidinium-chlordiazePOXIDE (LIBRAX) 5-2.5 MG capsule Take 1 capsule by mouth daily as needed. 60 capsule 3   digoxin (LANOXIN) 0.125 MG tablet TAKE 1/2 TABLET BY MOUTH DAILY 45 tablet 3   diltiazem (CARDIZEM CD) 120 MG 24 hr capsule TAKE 1 CAPSULE BY MOUTH DAILY 90 capsule 3   Doxylamine-Phenylephrine-APAP (SINUS & CONGESTION DAY/NIGHT) MISC 1 tablet Orally once a day As needed     fluticasone (FLONASE) 50 MCG/ACT nasal spray Place 1 spray into both nostrils daily as needed for allergies or rhinitis.     metoprolol succinate (TOPROL-XL) 50 MG 24 hr tablet Take 2 tablets (100 mg) in the morning.  Take  1 &1/2 tablets (75mg ) in the evening. Take with or immediately following a meal. 315 tablet 3   mirtazapine (REMERON) 15 MG tablet Take 15 mg by mouth at bedtime.     nitrofurantoin, macrocrystal-monohydrate, (MACROBID) 100 MG capsule Take 100 mg by mouth 2 (two) times daily.     Phenylephrine-APAP-guaiFENesin (EQ SINUS  CONGESTION & PAIN PO) Take 1 tablet by mouth daily as needed (for sinus pain). Contains acetaminophen 325 mg and Phenylephrine 5 mg     polyethylene glycol powder (GLYCOLAX/MIRALAX)  17 GM/SCOOP powder Take 17 g by mouth daily as needed for moderate constipation.     potassium chloride (KLOR-CON M) 10 MEQ tablet TAKE 1 TABLET BY MOUTH DAILY 90 tablet 3   Tiotropium Bromide Monohydrate (SPIRIVA RESPIMAT) 1.25 MCG/ACT AERS Inhale 2 puffs into the lungs daily. 4 g 5   torsemide (DEMADEX) 20 MG tablet Take 2 tablets (40 mg total) by mouth daily. Take 1 Tablet in the Morning and 1 Tablet in the evening 180 tablet 3   zolpidem (AMBIEN) 5 MG tablet Take 2.5 mg by mouth at bedtime.     No current facility-administered medications for this visit.    Socially she is divorced has 4 children 9 grandchildren. She does exercise. No tobacco use. She does occasional wine.  ROS General: Negative; No fevers, chills, or night sweats;  HEENT: Negative; No changes in vision or hearing, sinus congestion, difficulty swallowing Pulmonary:  unprovoked PE January 2023, bronchiectasis Cardiovascular: See HPI GI: Negative; No nausea, vomiting, diarrhea, or abdominal pain GU: Negative; No dysuria, hematuria, or difficulty voiding Musculoskeletal: Positive for significant scoliosis; fibromyalgia;  joint pain, or weakness Hematologic/Oncology: Negative; no easy bruising, bleeding Endocrine: Negative; no heat/cold intolerance; no diabetes Neuro: History of migraine headaches Skin: Positive for fungal infection of her toenail Psychiatric: Negative; No behavioral problems, depression Sleep: Positive for sleep apnea ; No snoring, daytime sleepiness, hypersomnolence, bruxism, restless legs, hypnogognic hallucinations, no cataplexy Other comprehensive 14 point system review is negative.  PE BP 114/72 (Patient Position: Sitting, Cuff Size: Normal)   Pulse 90   Ht 5\' 1"  (1.549 m)   Wt 113 lb (51.3 kg)   SpO2 98%   BMI 21.35 kg/m    Repeat blood pressure by me was 118/70  Wt Readings from Last 3 Encounters:  06/19/23 113 lb (51.3 kg)  05/22/23 114 lb 9.6 oz (52 kg)  04/20/23 109 lb (49.4 kg)     General: Alert, oriented, no distress.  Skin: normal turgor, no rashes, warm and dry HEENT: Normocephalic, atraumatic. Pupils equal round and reactive to light; sclera anicteric; extraocular muscles intact;  Nose without nasal septal hypertrophy Mouth/Parynx benign; Mallinpatti scale 3 Neck: No JVD, no carotid bruits; normal carotid upstroke Lungs: clear to ausculatation and percussion; no wheezing or rales Chest wall: without tenderness to palpitation Heart: PMI not displaced, irregular irregular rhythm with ventricular rate around 90., s1 s2 normal, 1/6 systolic murmur, no diastolic murmur, no rubs, gallops, thrills, or heaves Abdomen: soft, nontender; no hepatosplenomehaly, BS+; abdominal aorta nontender and not dilated by palpation. Back: Kyphoscoliosis Pulses 2+ Musculoskeletal: full range of motion, normal strength, no joint deformities Extremities: no clubbing cyanosis or edema, Homan's sign negative  Neurologic: grossly nonfocal; Cranial nerves grossly wnl Psychologic: Normal mood and affect     EKG Interpretation Date/Time:  Friday June 19 2023 09:25:02 EDT Ventricular Rate:  90 PR Interval:    QRS Duration:  82 QT Interval:  360 QTC Calculation: 440 R Axis:   -68  Text Interpretation: Atrial fibrillation Left anterior fascicular block Septal infarct (cited on or before 04-Feb-2022) When compared with ECG of 20-Apr-2023 11:01, Nonspecific T wave abnormality no longer evident in Inferior leads Confirmed by Nicki Guadalajara ([id number]) on 06/19/2023 10:04:55 AM    December 22, 2022  ECG (independently read by me):  Atrial fibrillation  at 101, LAHB, QS V1-3  September 15, 2022 ECG (independently read by me): Atrial fibrillation at 76, right axis; PRWP  April 30, 2022 ECG (independently read by me): Atrial fibrillation at 86, LAHB, PRWP   March 03, 2022 ECG (independently read by me): Atrial fibrillation at 97, QS V1-3  Jan 29, 2022 ECG (independently read by me): Probable A  fib with coarse fibrilatory waves at 97 bpm vs A flutter with variable block; QTc 441  December 19, 2021 ECG (independently read by me):  Atrial fibrillation at 94, LAHB    October 10, 2021 ECG (independently read by me): Sinus bradycardia at 59,   Feb 13, 2021 ECG (independently read by me): Sinus bradycardia at 59 with mild arhythmia  October 2021 ECG (independently read by me): Sinus rhythm at 67 with mild arrythmia; norma intervals  April 2021 ECG (independently read by me): Normal sinus rhythm at 69 bpm. No ectopy. Normal intervals.  October 18, 2019 ECG (independently read by me): Sinus bradycardia 59 bpm.  QS complex V1 V2.  Normal intervals.  No ectopy  October 2020 ECG (independently read by me): Normal sinus rhythm at 63 bpm.  QS complex V1 V2.  No ectopy.  Normal intervals.  June 2020 ECG (independently read by me): NSR at 62; no ectopy; normal intervals   November 2019 ECG (independently read by me): Normal sinus rhythm with PAC.  Ventricular rate 66.  QS V1 V2.  Mild T wave abnormality  June 2019 ECG (independently read by me): Normal sinus rhythm at 64 bpm.  Normal intervals.  No ectopy.  September 2018 ECG (independently read by me): Normal sinus rhythm at 64 bpm.  Isolated PAC.  QTc interval 414 ms.  QS V1 V2, unchanged.  May 2018 ECG (independently read by me): Normal sinus rhythm at 65 bpm.  QS V1, V2.  Normal intervals.  No ST segment changes.  March 2018 ECG (independently read by me): Normal sinus rhythm at 65 bpm.  No ectopy.  Normal intervals.  September 2017 ECG (independently read by me): Normal sinus rhythm at 62 bpm.  QS V1 and V2.  Normal intervals.  April 2017 ECG (independently read by me): Normal sinus rhythm at 70 bpm.  No ectopy.  PR interval 158 ms and QTc interval 414 ms.  March 2017 ECG (independently read by me): normal sinus rhythm at 61 bpm..  No ST segment changes.  Normal intervals.  QTc interval 396 ms.  August 2014 ECG (independently read by  me): Normal sinus rhythm at 63 bpm.  QRS couplets V1 V2.  No cigarette ST-T changes.  May 2016 ECG (independently read by me): Sinus bradycardia 59 bpm.  No ectopy.  February 2016 ECG (independently read by me): Sinus bradycardia 56 bpm.  Normal intervals.  Prior ECG (independently read by me): Normal sinus rhythm at 62 beats per minute.  QTc interval 411 milliseconds.  QRS complex V1, V2.  Normal intervals.  ECG (independently read by me): Normal sinus rhythm at 62 beats per minute. Normal intervals. QTc interval 399 ms.  Prior ECG of 07/13/2013: Sinus rhythm at 61 beats per minute. QTc interval 406 ms. PR interval normal at 168 ms.  LABS:    Latest Ref Rng & Units 03/30/2023   12:02 PM 03/16/2023    1:00 PM 03/02/2023   11:41 AM  BMP  Glucose 70 - 99 mg/dL 92  93  161   BUN 8 - 27 mg/dL 21  18  20  Creatinine 0.57 - 1.00 mg/dL 6.21  3.08  6.57   BUN/Creat Ratio 12 - 28 17  16     Sodium 134 - 144 mmol/L 138  138  137   Potassium 3.5 - 5.2 mmol/L 4.7  3.8  3.9   Chloride 96 - 106 mmol/L 97  93  95   CO2 20 - 29 mmol/L 29  25  31    Calcium 8.7 - 10.3 mg/dL 9.5  9.4  9.6       Latest Ref Rng & Units 09/15/2022    4:04 PM 05/14/2022   10:35 AM 01/29/2022    4:06 PM  Hepatic Function  Total Protein 6.0 - 8.5 g/dL 7.3  7.2  7.6   Albumin 3.7 - 4.7 g/dL 4.2  4.5  4.6   AST 0 - 40 IU/L 25  30  28    ALT 0 - 32 IU/L 15  21  24    Alk Phosphatase 44 - 121 IU/L 101  82  69   Total Bilirubin 0.0 - 1.2 mg/dL 1.1  0.9  0.5       Latest Ref Rng & Units 03/02/2023   11:41 AM 09/15/2022    4:04 PM 04/17/2022    2:07 PM  CBC  WBC 4.0 - 10.5 K/uL 6.1  7.7  5.8   Hemoglobin 12.0 - 15.0 g/dL 84.6  96.2  95.2   Hematocrit 36.0 - 46.0 % 44.1  43.3  38.7   Platelets 150.0 - 400.0 K/uL 233.0  269  205.0    Lab Results  Component Value Date   TSH 1.220 09/15/2022  Lipid Panel     Component Value Date/Time   CHOL 154 12/03/2016 0845   TRIG 81 12/03/2016 0845   HDL 67 12/03/2016 0845   CHOLHDL  2.3 12/03/2016 0845   VLDL 16 12/03/2016 0845   LDLCALC 71 12/03/2016 0845     RADIOLOGY:  CT CHEST 10/09/2021 IMPRESSION: 1. 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 Chronic aspiration could have a similar appearance. 2. Part solid nodule of the right lower lobe measuring 7.5 mm in mean diameter with 4 mm solid component. Follow-up non-contrast CT recommended at 3-6 months to confirm persistence. If unchanged, and solid component remains <6 mm, annual CT is recommended until 5 years of stability has been established. If persistent these nodules should be considered highly suspicious if the solid component of the nodule is 6 mm or greater in size and enlarging. This recommendation follows the consensus statement: Guidelines for Management of Incidental Pulmonary Nodules Detected on CT Images: From the Fleischner Society 2017; Radiology 2017; 284:228-243. 3.  Aortic Atherosclerosis (ICD10-I70.0).     ECHO: 12/03/2021  1. Left ventricular ejection fraction, by estimation, is 60 to 65%. Left  ventricular ejection fraction by 2D MOD biplane is 60.8 %. The left  ventricle has normal function. The left ventricle has no regional wall  motion abnormalities. There is mild left  ventricular hypertrophy. Left ventricular diastolic function could not be  evaluated.   2. Left atrial size was moderately dilated.   3. Right atrial size was moderately dilated.   4. The pericardial effusion is circumferential. There is no evidence of  cardiac tamponade.   5. The mitral valve is myxomatous. Moderate mitral valve regurgitation.  There is moderate late systolic prolapse  of the middle scallop of the  posterior leaflet of the mitral valve.   6. Tricuspid valve regurgitation is moderate.   7. The  aortic valve is tricuspid. Aortic valve sclerosis is present, with  no evidence of aortic valve stenosis.   8. There is normal pulmonary artery systolic pressure. The estimated  right ventricular systolic pressure is 34.4 mmHg.   9. The inferior vena cava is dilated in size with >50% respiratory  variability, suggesting right atrial pressure of 8 mmHg.   Comparison(s): Changes from prior study are noted. 10/15/2021: LVEF 63%,  moderate to severe MR with posterior leaflet prolapse, grade 2 DD.    ECHO: 04/07/2023 IMPRESSIONS   1. Left ventricular ejection fraction, by estimation, is 55 to 60%. The  left ventricle has normal function. The left ventricle has no regional  wall motion abnormalities. Left ventricular diastolic function could not  be evaluated.   2. Right ventricular systolic function is normal. The right ventricular  size is normal. There is moderately elevated pulmonary artery systolic  pressure. The estimated right ventricular systolic pressure is 51.0 mmHg.   3. Left atrial size was severely dilated.   4. Right atrial size was severely dilated.   5. The mitral valve is myxomatous. Moderate to severe mitral valve  regurgitation. No evidence of mitral stenosis. There is moderate  holosystolic prolapse of the middle scallop of the posterior leaflet of  the mitral valve.   6. Tricuspid valve regurgitation is mild to moderate.   7. The aortic valve is tricuspid. Aortic valve regurgitation is not  visualized. Aortic valve sclerosis/calcification is present, without any  evidence of aortic stenosis.   8. Pulmonic valve regurgitation is moderate.   9. The inferior vena cava is normal in size with <50% respiratory  variability, suggesting right atrial pressure of 8 mmHg.  10. Consider TEE if clinically indicated and if patient is a candidate for  mitraclip.    IMPRESSION:  1. Persistent atrial fibrillation (HCC)   2. Chronic anticoagulation   3. Bronchiectasis without  complication (HCC)   4. History of pulmonary embolism   5. Moderate to severe mitral regurgitation   6. Other idiopathic scoliosis, thoracolumbar region   7. Chronic cystitis     ASSESSMENT AND PLAN: Janet Nguyen is an 89 year old female who has a remote history of SVT and PACs/PVCs which were controlled with beta blocker therapy. An echo in March 2014 showed normal systolic function with grade 1 diastolic dysfunction and had significant left atrial dilatation and mild dilatation of the right ventricle with moderate dilatation of the right atrium. She had mild/moderate pulmonary hypertension with PA pressures of 42 mm. An echo in October 2018 showed an EF of 55 to 60% with grade 1 diastolic dysfunction, mitral annular calcification with mild MR, and mild pulmonary hypertension with PA pressure 34 mm. Her left atrium was mild to moderately dilated.  She had venous reflux on lower extremity reflux evaluation which did not show any evidence of DVT in October 2021.  A chest CT in January 2023 was done for increasing shortness of breath and revealed probable reactive mediastinal lymph nodes, scattered focal areas of bronchiectasis and clustered nodules in the lower lobes as well as a solitary nodule in the right lower lobe at 7.5 mm with a 4 mm solid component.  She subsequently was found to have pulmonary embolism on January 23 and was started on Eliquis anticoagulation in addition to Spiriva for her shortness of breath by Dr.  Ramaswamy.  She was hospitalized with atrial fibrillation with RVR on December 02, 2021.  Her echo Doppler study at that time showed an EF of 60 to 65% with mild LVH there was moderate biatrial enlargement, small circumferential pericardial effusion and she had moderate late systolic prolapse of the middle scallop of the posterior leaflet of the mitral valve resulting in moderate mitral regurgitation.  There is aortic sclerosis.  When I saw her in March 2023 she continued to be in atrial  fibrillation at that time metoprolol succinate was further titrated to 100 mg daily but I recommended she take 50 mg twice a day for more blood pressure stability.  She underwent successful cardioversion on January 08, 2022 performed by Dr. Shari Prows.  Subsequently, she has been in  persistent atrial fibrillation on subsequent evaluation at which time her diltiazem dose was increased to 180 mg and she was referred to atrial fibrillation clinic.  She is not a candidate for amiodarone due to her lung disease.  It was advised that she consider admission for Tikosyn initiation at low-dose but she canceled the admission.  When I saw her when I saw her in June 2023 we had a lengthy discussion and she had reduced her dose of diltiazem down to 120 mg.  She did not feel confident about pursuing Tikosyn and her decision was to attempt rate control.  I added digoxin 0.0625 mg for additional rate control.  A subsequent CT scan was done and she has seen Dr. Marchelle Gearing.  She has bronchiectasis.  Her previous lung nodule had improved.  She has had some issues with lower extremity edema leading to discontinuance of furosemide with transition to torsemide.  Presently, she continues to be in longstanding persistent atrial fibrillation with current ventricular rate in the 90s.  She is on low-dose anticoagulation with Eliquis 2.5 mg twice a day.  Her rate is controlled with diltiazem CD120 daily in addition to metoprolol succinate 100 mg in the morning and 75 mg at night.  She has been taking torsemide 20 mg twice a day with improvement in her lower extremity edema.  Her most recent echo Doppler study from April 07, 2023 showed EF at 55 to 60%.  She had moderate right ventricular pressure elevation 51 mm.  She had severe biatrial enlargement.  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  There was mild to moderate TR and moderate PR.   With her age and comorbidities, I do not feel she is a candidate for consideration for mitral valve clip.  Clinically she is relatively stable.  Blood pressure stable on current regimen.  She does have venous stasis changes without significant edema presently.  I have recommended she have a follow-up evaluation with Whitney Muse board, NP in January 2025 and I will see her in follow-up in April for reassessment.    Lennette Bihari, MD, [facility]  06/21/2023 8:52 PM

## 2023-06-19 NOTE — Telephone Encounter (Signed)
Pt c/o medication issue:  1. Name of Medication:   potassium chloride (KLOR-CON M) 10 MEQ tablet   2. How are you currently taking this medication (dosage and times per day)?   As prescribed  3. Are you having a reaction (difficulty breathing--STAT)?   4. What is your medication issue?   Patient stated the cost of this medicine has increased and she has not been able to get this medication.  Patient wants a call back on next steps.

## 2023-06-19 NOTE — Telephone Encounter (Signed)
Patient states her potassium is now $35 and she can not afford it.  She states she has enough for 3-4 days. She would like you to know what should she do? She states her medication is getting expensive and she will not be able to afford them much longer.

## 2023-06-19 NOTE — Patient Instructions (Signed)
Medication Instructions:  No medications changes *If you need a refill on your cardiac medications before your next appointment, please call your pharmacy*   Lab Work: none If you have labs (blood work) drawn today and your tests are completely normal, you will receive your results only by: MyChart Message (if you have MyChart) OR A paper copy in the mail If you have any lab test that is abnormal or we need to change your treatment, we will call you to review the results.   Testing/Procedures: none   Follow-Up: At [facility], you and your health needs are our priority.  As part of our continuing mission to provide you with exceptional heart care, we have created designated Provider Care Teams.  These Care Teams include your primary Cardiologist (physician) and Advanced Practice Providers (APPs -  Physician Assistants and Nurse Practitioners) who all work together to provide you with the care you need, when you need it.  We recommend signing up for the patient portal called "MyChart".  Sign up information is provided on this After Visit Summary.  MyChart is used to connect with patients for Virtual Visits (Telemedicine).  Patients are able to view lab/test results, encounter notes, upcoming appointments, etc.  Non-urgent messages can be sent to your provider as well.   To learn more about what you can do with MyChart, go to [email or web address].    Your next appointment:   4 month(s)  Provider:   Carlos Levering, NP    Then, Nicki Guadalajara, MD will plan to see you again in 6 month(s).

## 2023-06-21 ENCOUNTER — Encounter: Payer: Self-pay | Admitting: Cardiovascular Disease

## 2023-06-22 ENCOUNTER — Ambulatory Visit: Payer: Medicare Other | Admitting: Physical Therapy

## 2023-06-22 ENCOUNTER — Encounter: Payer: Self-pay | Admitting: Physical Therapy

## 2023-06-22 DIAGNOSIS — M6281 Muscle weakness (generalized): Secondary | ICD-10-CM | POA: Diagnosis not present

## 2023-06-22 DIAGNOSIS — M4125 Other idiopathic scoliosis, thoracolumbar region: Secondary | ICD-10-CM | POA: Diagnosis not present

## 2023-06-22 DIAGNOSIS — R293 Abnormal posture: Secondary | ICD-10-CM

## 2023-06-22 NOTE — Therapy (Signed)
OUTPATIENT PHYSICAL THERAPY FEMALE PELVIC Treatment   Patient Name: Janet Nguyen MRN: [id number] DOB:1932/08/23, 89 y.o., female Today's Date: 06/22/2023  END OF SESSION:  PT End of Session - 06/22/23 1235     Visit Number 9    Date for PT Re-Evaluation 07/06/23    Authorization Type medicare - needs KX at visit 15    Progress Note Due on Visit 10    PT Start Time 1234    PT Stop Time 1318    PT Time Calculation (min) 44 min    Activity Tolerance Patient tolerated treatment well    Behavior During Therapy [facility] for tasks assessed/performed                 Past Medical History:  Diagnosis Date   Arrhythmia    History of SVT with documented PVC'S and  PAC'S  12/08/12 Nuc stress test normal LV EF 74%  Event Monitor  12/01/12-01/03/13   Atrial flutter (HCC)    Celiac disease    treated by Dr. Kinnie Scales   GERD (gastroesophageal reflux disease)    Hypertension    Intervertebral disc stenosis of neural canal of cervical region    Irregular heart beat 11/30/2012   ECHO-EF 60-65%   Osteoporosis    PMR (polymyalgia rheumatica) (HCC)    Dr. Mallie Mussel; pt states she was diagnosed 10-15 years ago, not treated at this time or any issues that she is aware of.   Scoliosis    Scoliosis    Sleep apnea 10/02/11 [facility]   Sleep study AHI -total sleep 10.3/hr  64.0/ hr during REM sleep.RDI 22.8/hr during total sleep 64.0/hr during REM sleep The lowest O2 sat during Non-REM and REM sleep was 86% and 88% respectively. 04/08/12 CPAP/BIPAP titration study [facility]   Past Surgical History:  Procedure Laterality Date   APPENDECTOMY     ruptured at age 29 and had surgery   CARDIAC CATHETERIZATION  01/27/06   CARDIOVERSION N/A 01/08/2022   Procedure: CARDIOVERSION;  Surgeon: Meriam Sprague, MD;  Location: [facility] ENDOSCOPY;  Service: Cardiovascular;  Laterality: N/A;   cataract surgery  2015   Dr. Elmer Picker; March & April 2015   Patient Active Problem  List   Diagnosis Date Noted   Dyspnea 05/30/2023   Pleural effusion 05/30/2023   Radon exposure 05/30/2023   Secondary hypercoagulable state (HCC) 12/12/2021   Hyponatremia 12/04/2021   Acute CHF (congestive heart failure) (HCC) 12/02/2021   Chronic diastolic heart failure (HCC) 12/02/2021   Atrial fibrillation with rapid ventricular response (HCC) 12/02/2021   Essential hypertension 12/02/2021   GERD without esophagitis 12/02/2021   Obstructive sleep apnea 12/02/2021   Interstitial lung disease (HCC) 12/02/2021   History of pulmonary embolism 12/02/2021   Pulmonary embolism (HCC) 11/06/2021   Bronchiectasis (HCC) 11/06/2021   Deviated septum 05/08/2020   Mass of subcutaneous tissue 05/02/2020   Vertigo 06/16/2019   Pulmonary hypertension, unspecified (HCC) 04/14/2019   Ischemic colitis (HCC) 04/14/2019   Migraine with aura and without status migrainosus, not intractable 11/08/2018   Degeneration of lumbar intervertebral disc 10/14/2018   Hoarseness of voice 03/04/2018   Abdominal pain 07/01/2017   Diverticulitis, colon    Metabolic acidosis, increased anion gap    Constipation 04/14/2017   Lumbar hernia 04/14/2017   Presbycusis of both ears 01/10/2017   Tinnitus aurium, bilateral 01/10/2017   Gastroesophageal reflux disease 08/20/2016   Hemoptysis 08/20/2016   Obstructive sleep apnea of adult 08/20/2016  Rhinitis, chronic 08/20/2016   Throat pain in adult 08/20/2016   Fatigue 12/23/2015   Sciatica of right side 10/06/2015   History of migraine headaches 10/06/2015   Frequent PVCs 12/28/2013   Premature atrial contractions 12/28/2013   PSVT (paroxysmal supraventricular tachycardia) (HCC) 12/28/2013   Heart palpitations 07/13/2013   Sleep apnea 04/11/2013   Scoliosis 04/11/2013   Atypical atrial flutter (HCC) 11/29/2012   Chest pain, atypical 11/29/2012   Fibromyalgia syndrome 11/29/2012   Chronic steroid use 11/29/2012    PCP: [name], MD   REFERRING  PROVIDER:    Randa Spike, FNP    REFERRING DIAG: R39.89 (ICD-10-CM) - Other symptoms and signs involving the genitourinary system   THERAPY DIAG:  Muscle weakness (generalized)  Abnormal posture  Other idiopathic scoliosis, thoracolumbar region  Rationale for Evaluation and Treatment: Rehabilitation  ONSET DATE: started earlier this year  SUBJECTIVE:                                                                                                                                                                                           SUBJECTIVE STATEMENT: My stomach is bad today.  I can't exercise.  Everything hurts.  My neck has been bad lately and it is cracking a lot.  I am mentally and physically down and out.  PAIN:  Are you having pain? Yes NPRS scale: can't express a number - everything hurts today ? /10  Pain location: External  Pain type: burning Pain description: intermittent   Aggravating factors: just always irritated Relieving factors: not sure  PRECAUTIONS: None  RED FLAGS: None   WEIGHT BEARING RESTRICTIONS: No  FALLS:  Has patient fallen in last 6 months? No  LIVING ENVIRONMENT: Lives with: lives alone Lives in: House/apartment   OCCUPATION: retired - exercise walking with shopping cart  PLOF: Independent  PATIENT GOALS: have less leakage/urgency  PERTINENT HISTORY:  Scoliosis, lung and heart disease, 4 vaginal deliveries   BOWEL MOVEMENT: Pain with bowel movement: No Type of bowel movement:Type (Bristol Stool Scale) solid and normal usually, skinny, Frequency daily, and Strain No Fully empty rectum: Yes: I think so Leakage: Yes:   Pads: Yes: 1 at night and 1 morning unless something happens Fiber supplement: No  URINATION: Pain with urination: No Fully empty bladder: Yes: I don't know Stream:  not sure Urgency: Yes: more than once a week Frequency: sometimes every hour, sometimes 1-2/day; wakes up due to back and then I just  go to the bathroom Leakage: Urge to void and Walking to the bathroom Pads: Yes: 1 in morning and 1 at night   PREGNANCY: Vaginal  deliveries 4   PROLAPSE:    OBJECTIVE:  06/04/23: SpO2: 98% HR: 84     DIAGNOSTIC FINDINGS:    PATIENT SURVEYS:    COGNITION: Overall cognitive status: Within functional limits for tasks assessed       GAIT:  Comments: wide BOS  POSTURE:  severe scoliosis  PELVIC ALIGNMENT:  LUMBARAROM/PROM: See ortho notes  LOWER EXTREMITY ROM:  See ortho notes  LOWER EXTREMITY MMT:  See ortho notes PALPATION:                  External Perineal Exam redness of vulva, small amount of stool                             Internal Pelvic Floor - not fully relax in between reps, hold for 2 seconds only, 5 quick flicks with diminishing strength  Patient confirms identification and approves PT to assess internal pelvic floor and treatment Yes  PELVIC MMT:   MMT eval  Vaginal   Internal Anal Sphincter 3/5  External Anal Sphincter 3/5  Puborectalis 3+/5  Diastasis Recti   (Blank rows = not tested)        TONE: Slightly lower  PROLAPSE: Not noted (did not do anterior pelvic assessment)  TODAY'S TREATMENT:                                                                                                                              DATE:  06/22/23: Seated bil upper quadrant broadening Rt SL STM to thoracic and lumbar paraspinals, Lt QL, Lt obliques, Lt lat, intrascapular Concurrent with manual therapy: discussion about ways to find social calendars and groups to plug into, trying to avoid negative thought and verbalization cycling  06/15/23: NuStep L5 x 3' - Pt limited by SOB and fatigue and disinterest in exercise Rt SL STM to thoracic and lumbar paraspinals, Lt QL, Lt obliques, Lt lat, intrascapular Supine: SKTC, DKTC, HS stretch with ankle pumps and circles, fig 4 stretch, LTR, bridge x10 Seated bil upper quadrant  broadening  06/11/23 Therapeutic activities: Extensive discussion about what pelvic floor PT can offer her, prognosis, likely causes of what is causing fecal incontinence In depth discussion about anorectal pelvic floor/external anal sphincter exam and what we are hoping to learn by this exam; what treatment for external anal sphincter weakness, poor coordination, and decreased sensation would look like   06/04/23 Neuromuscular re-education: Pelvic floor contraction training with external feedback Quick flicks Long holds  Therapeutic activities: Importance of complete emptying Internal vaginal or rectal exams to help with coordination of pelvic floor contraction  HEP compliance Training in multiple positions    05/06/23 Neuromuscular re-education: Pelvic floor muscle contractions - practice 10 second holds in different positions  Exercises: Seated hip adduction 2 x 10 ball squeeze Seated hip abduction 2 x 10 yellow band Seated march 2 x 10 Therapeutic activities: Psychologist, counselling potty problem  solving Making sure she is completely emptying bowel Balloon breathing to help empty the bowel, no contractions to help bowel movement come down    PATIENT EDUCATION:  Education details: Access Code: [email or web address] Person educated: Patient Education method: Explanation and Handouts Education comprehension: verbalized understanding  HOME EXERCISE PROGRAM: Access Code: [email or web address] URL: [email or web address] Date: 04/27/2023 Prepared by: Dwana Curd  Exercises - Clamshell  - 1 x daily - 7 x weekly - 3 sets - 10 reps - Beginner Reverse Clamshell  - 1 x daily - 7 x weekly - 3 sets - 10 reps  BVTGJDAM new pelvic floor program   ASSESSMENT:  CLINICAL IMPRESSION: Pt arrived with stomach upset and declined any opportunity to exercise.  Alongside manual therapy we discussed at length ways to plug into social groups/calendars and to say yes to opportunities for new activities and  experiences.  Pt tends to negative cycle in thought and verbal expression and we discussed how breaking out of this is a chosen effort.  OBJECTIVE IMPAIRMENTS: decreased coordination, decreased endurance, decreased ROM, decreased strength, impaired tone, and postural dysfunction.   ACTIVITY LIMITATIONS: lifting, bending, standing, sleeping, continence, toileting, and locomotion level  PARTICIPATION LIMITATIONS: community activity  PERSONAL FACTORS: Age, Past/current experiences, and 1-2 comorbidities: 4 vaginal deliveries, scolosis  are also affecting patient's functional outcome.   REHAB POTENTIAL: Good  CLINICAL DECISION MAKING: Evolving/moderate complexity  EVALUATION COMPLEXITY: Moderate   GOALS: Goals reviewed with patient? Yes  SHORT TERM GOALS: Target date: 05/20/23 - updated 05/06/23 - updated 06/11/23  Ind with initial HEP Baseline: Goal status: MET 05/06/23  2.  Ind with urge and toileting techniques Baseline:  Goal status: IN PROGRESS  LONG TERM GOALS: Target date: 07/15/23 - updated 05/06/23 - updated 06/11/23  Pt will report having feeling of when there is stool in the anal canal (can feel it coming out) Baseline:  Goal status: IN PROGRESS  2.  Pt will be independent with advanced HEP to maintain improvements made throughout therapy  Baseline:  Goal status: IN PROGRESS  3.  Pt will report less pain and itchiness of the vulva due to improved skin care Baseline:  Goal status: IN PROGRESS  4.  Pt will be able to contract and hold pelvic floor for at least 10 seconds in order to reduce feeling of urgency. Baseline:  Goal status: IN PROGRESS   PLAN:  PT FREQUENCY: 1x/week  PT DURATION: 12 weeks  PLANNED INTERVENTIONS: Therapeutic exercises, Therapeutic activity, Neuromuscular re-education, Balance training, Gait training, Patient/Family education, Self Care, Joint mobilization, Dry Needling, Electrical stimulation, Cryotherapy, Moist heat, Taping, Traction,  Biofeedback, Manual therapy, and Re-evaluation  PLAN FOR NEXT SESSION: pelvic floor: Internal anorectal exam and treatment, manual therapy and exercise as tol  Linh Johannes, PT 06/22/23 1:23 PM

## 2023-06-23 ENCOUNTER — Ambulatory Visit: Payer: Medicare Other | Admitting: Cardiovascular Disease

## 2023-06-23 DIAGNOSIS — R159 Full incontinence of feces: Secondary | ICD-10-CM | POA: Diagnosis not present

## 2023-06-24 NOTE — Telephone Encounter (Signed)
Patient saw TK on 9/20. Does not mention in encounter about stopping the medication. Called pt to discuss. Waiting for pt to call back.

## 2023-06-29 ENCOUNTER — Ambulatory Visit: Payer: Medicare Other | Admitting: Physical Therapy

## 2023-07-06 ENCOUNTER — Ambulatory Visit: Payer: Medicare Other | Attending: Family Medicine | Admitting: Physical Therapy

## 2023-07-06 ENCOUNTER — Encounter: Payer: Self-pay | Admitting: Physical Therapy

## 2023-07-06 DIAGNOSIS — R293 Abnormal posture: Secondary | ICD-10-CM | POA: Diagnosis not present

## 2023-07-06 DIAGNOSIS — M4125 Other idiopathic scoliosis, thoracolumbar region: Secondary | ICD-10-CM | POA: Diagnosis not present

## 2023-07-06 DIAGNOSIS — M6281 Muscle weakness (generalized): Secondary | ICD-10-CM | POA: Insufficient documentation

## 2023-07-06 NOTE — Therapy (Signed)
OUTPATIENT PHYSICAL THERAPY FEMALE LUMBAR Treatment  Progress Note Reporting Period 04/22/23 to 07/06/23  See note below for Objective Data and Assessment of Progress/Goals.      Patient Name: Janet Nguyen MRN: [id number] DOB:05-10-1932, 89 y.o., female Today's Date: 07/06/2023  END OF SESSION:  PT End of Session - 07/06/23 1228     Visit Number 10    Date for PT Re-Evaluation 08/10/23    Authorization Type medicare - needs KX at visit 15    Progress Note Due on Visit 20    PT Start Time 1230    PT Stop Time 1315    PT Time Calculation (min) 45 min    Activity Tolerance Patient limited by pain    Behavior During Therapy [facility] for tasks assessed/performed                  Past Medical History:  Diagnosis Date   Arrhythmia    History of SVT with documented PVC'S and  PAC'S  12/08/12 Nuc stress test normal LV EF 74%  Event Monitor  12/01/12-01/03/13   Atrial flutter (HCC)    Celiac disease    treated by Dr. Kinnie Scales   GERD (gastroesophageal reflux disease)    Hypertension    Intervertebral disc stenosis of neural canal of cervical region    Irregular heart beat 11/30/2012   ECHO-EF 60-65%   Osteoporosis    PMR (polymyalgia rheumatica) (HCC)    Dr. Mallie Mussel; pt states she was diagnosed 10-15 years ago, not treated at this time or any issues that she is aware of.   Scoliosis    Scoliosis    Sleep apnea 10/02/11 [facility]   Sleep study AHI -total sleep 10.3/hr  64.0/ hr during REM sleep.RDI 22.8/hr during total sleep 64.0/hr during REM sleep The lowest O2 sat during Non-REM and REM sleep was 86% and 88% respectively. 04/08/12 CPAP/BIPAP titration study [facility]   Past Surgical History:  Procedure Laterality Date   APPENDECTOMY     ruptured at age 75 and had surgery   CARDIAC CATHETERIZATION  01/27/06   CARDIOVERSION N/A 01/08/2022   Procedure: CARDIOVERSION;  Surgeon: Meriam Sprague, MD;  Location[facility] ENDOSCOPY;   Service: Cardiovascular;  Laterality: N/A;   cataract surgery  2015   Dr. Elmer Picker; March & April 2015   Patient Active Problem List   Diagnosis Date Noted   Dyspnea 05/30/2023   Pleural effusion 05/30/2023   Radon exposure 05/30/2023   Secondary hypercoagulable state (HCC) 12/12/2021   Hyponatremia 12/04/2021   Acute CHF (congestive heart failure) (HCC) 12/02/2021   Chronic diastolic heart failure (HCC) 12/02/2021   Atrial fibrillation with rapid ventricular response (HCC) 12/02/2021   Essential hypertension 12/02/2021   GERD without esophagitis 12/02/2021   Obstructive sleep apnea 12/02/2021   Interstitial lung disease (HCC) 12/02/2021   History of pulmonary embolism 12/02/2021   Pulmonary embolism (HCC) 11/06/2021   Bronchiectasis (HCC) 11/06/2021   Deviated septum 05/08/2020   Mass of subcutaneous tissue 05/02/2020   Vertigo 06/16/2019   Pulmonary hypertension, unspecified (HCC) 04/14/2019   Ischemic colitis (HCC) 04/14/2019   Migraine with aura and without status migrainosus, not intractable 11/08/2018   Degeneration of lumbar intervertebral disc 10/14/2018   Hoarseness of voice 03/04/2018   Abdominal pain 07/01/2017   Diverticulitis, colon    Metabolic acidosis, increased anion gap    Constipation 04/14/2017   Lumbar hernia 04/14/2017   Presbycusis of both ears 01/10/2017   Tinnitus  aurium, bilateral 01/10/2017   Gastroesophageal reflux disease 08/20/2016   Hemoptysis 08/20/2016   Obstructive sleep apnea of adult 08/20/2016   Rhinitis, chronic 08/20/2016   Throat pain in adult 08/20/2016   Fatigue 12/23/2015   Sciatica of right side 10/06/2015   History of migraine headaches 10/06/2015   Frequent PVCs 12/28/2013   Premature atrial contractions 12/28/2013   PSVT (paroxysmal supraventricular tachycardia) (HCC) 12/28/2013   Heart palpitations 07/13/2013   Sleep apnea 04/11/2013   Scoliosis 04/11/2013   Atypical atrial flutter (HCC) 11/29/2012   Chest pain, atypical  11/29/2012   Fibromyalgia syndrome 11/29/2012   Chronic steroid use 11/29/2012    PCP: [name], MD   REFERRING PROVIDER:    Randa Spike, FNP    REFERRING DIAG: R39.89 (ICD-10-CM) - Other symptoms and signs involving the genitourinary system   THERAPY DIAG:  Muscle weakness (generalized)  Abnormal posture  Other idiopathic scoliosis, thoracolumbar region  Rationale for Evaluation and Treatment: Rehabilitation  ONSET DATE: started earlier this year  SUBJECTIVE:                                                                                                                                                                                           SUBJECTIVE STATEMENT: I am tired today.  I have had some scary acute pains in my hips in the night when trying to get into bed or adjust the covers.  It didn't last but was sharp and scared me.    PAIN:  Are you having pain? Yes NPRS scale: 7/10  Pain location: External  Pain type: sharp in hips  Pain description: intermittent   Aggravating factors: just always irritated Relieving factors: not sure  PRECAUTIONS: None  RED FLAGS: None   WEIGHT BEARING RESTRICTIONS: No  FALLS:  Has patient fallen in last 6 months? No  LIVING ENVIRONMENT: Lives with: lives alone Lives in: House/apartment   OCCUPATION: retired - exercise walking with shopping cart  PLOF: Independent  PATIENT GOALS: have less leakage/urgency  PERTINENT HISTORY:  Scoliosis, lung and heart disease, 4 vaginal deliveries   BOWEL MOVEMENT: Pain with bowel movement: No Type of bowel movement:Type (Bristol Stool Scale) solid and normal usually, skinny, Frequency daily, and Strain No Fully empty rectum: Yes: I think so Leakage: Yes:   Pads: Yes: 1 at night and 1 morning unless something happens Fiber supplement: No  URINATION: Pain with urination: No Fully empty bladder: Yes: I don't know Stream:  not sure Urgency: Yes: more than  once a week Frequency: sometimes every hour, sometimes 1-2/day; wakes up due to back and then I just go to the  bathroom Leakage: Urge to void and Walking to the bathroom Pads: Yes: 1 in morning and 1 at night   PREGNANCY: Vaginal deliveries 4   PROLAPSE:    OBJECTIVE:  06/04/23: SpO2: 98% HR: 84  DIAGNOSTIC FINDINGS:    PATIENT SURVEYS:    COGNITION: Overall cognitive status: Within functional limits for tasks assessed       GAIT:  Comments: wide BOS  POSTURE:  severe scoliosis  PELVIC ALIGNMENT:  LUMBARAROM/PROM: See ortho notes  LOWER EXTREMITY ROM:  See ortho notes  LOWER EXTREMITY MMT:  See ortho notes PALPATION:                  External Perineal Exam redness of vulva, small amount of stool                             Internal Pelvic Floor - not fully relax in between reps, hold for 2 seconds only, 5 quick flicks with diminishing strength  Patient confirms identification and approves PT to assess internal pelvic floor and treatment Yes  PELVIC MMT:   MMT eval  Vaginal   Internal Anal Sphincter 3/5  External Anal Sphincter 3/5  Puborectalis 3+/5  Diastasis Recti   (Blank rows = not tested)        TONE: Slightly lower  PROLAPSE: Not noted (did not do anterior pelvic assessment)  TODAY'S TREATMENT:                                                                                                                              DATE:  07/06/23: NuStep L1 x 5' PT present to discuss status Seated ball squeeze 5x5" Seated yellow loop hip abd x20 Sit to stand x5 with focused glut squeeze Supine passive HS stretch bil, hip ROM bil, light traction through LE Rt and Lt Seated manual therapy: bil upper traps, rhomboids, cervical paraspinals STM, thoracic paraspinal STM, lumbar STM Continued discussion around need for social connections, finding hired help at home for cleaning   06/22/23: Seated bil upper quadrant broadening Rt SL STM to thoracic  and lumbar paraspinals, Lt QL, Lt obliques, Lt lat, intrascapular Concurrent with manual therapy: discussion about ways to find social calendars and groups to plug into, trying to avoid negative thought and verbalization cycling  06/15/23: NuStep L5 x 3' - Pt limited by SOB and fatigue and disinterest in exercise Rt SL STM to thoracic and lumbar paraspinals, Lt QL, Lt obliques, Lt lat, intrascapular Supine: SKTC, DKTC, HS stretch with ankle pumps and circles, fig 4 stretch, LTR, bridge x10 Seated bil upper quadrant broadening    PATIENT EDUCATION:  Education details: Access Code: [email or web address] Person educated: Patient Education method: Explanation and Handouts Education comprehension: verbalized understanding  HOME EXERCISE PROGRAM: Access Code: [email or web address] URL: [email or web address] Date: 04/27/2023 Prepared by: Dwana Curd  Exercises - Clamshell  - 1 x daily - 7 x weekly - 3  sets - 10 reps - Beginner Reverse Clamshell  - 1 x daily - 7 x weekly - 3 sets - 10 reps  BVTGJDAM new pelvic floor program   ASSESSMENT:  CLINICAL IMPRESSION: Pt missed PT last week and was unable to schedule appointments due to PT full schedule at beginning of this cert period so we will extend her for 2 more visits.  We discussed that given her low exercise tolerance we are close to meeting plateau in progress in PT at this time.  She greatly benefits from low intensity stretches and exercise along with STM to trunk and neck for chronic pain related to severe degenerative scoliosis.  PT continues to encourage Pt to seek social connections and hired help for household cleaning and tasks.    OBJECTIVE IMPAIRMENTS: decreased coordination, decreased endurance, decreased ROM, decreased strength, impaired tone, and postural dysfunction.   ACTIVITY LIMITATIONS: lifting, bending, standing, sleeping, continence, toileting, and locomotion level  PARTICIPATION LIMITATIONS: community  activity  PERSONAL FACTORS: Age, Past/current experiences, and 1-2 comorbidities: 4 vaginal deliveries, scolosis  are also affecting patient's functional outcome.   REHAB POTENTIAL: Good  CLINICAL DECISION MAKING: Evolving/moderate complexity  EVALUATION COMPLEXITY: Moderate   GOALS: Goals reviewed with patient? Yes  SHORT TERM GOALS: Target date: 05/20/23 - updated 05/06/23 - updated 06/11/23  Ind with initial HEP Baseline: Goal status: MET 05/06/23  2.  Ind with urge and toileting techniques Baseline:  Goal status: DEFERRED SECONDARY TO PT NOT DOING PELVIC FLOOR PT ANYMORE  LONG TERM GOALS: Target date: 07/15/23 - updated 05/06/23 - updated 06/11/23  Pt will report having feeling of when there is stool in the anal canal (can feel it coming out) Baseline:  Goal status: DEFERRED SECONDARY TO PT NOT DOING PELVIC FLOOR PT ANYMORE  2.  Pt will be independent with advanced HEP to maintain improvements made throughout therapy  Baseline:  Goal status: IN PROGRESS  3.  Pt will report less pain and itchiness of the vulva due to improved skin care Baseline:  Goal status: DEFERRED SECONDARY TO PT NOT DOING PELVIC FLOOR PT ANYMORE  4.  Pt will be able to contract and hold pelvic floor for at least 10 seconds in order to reduce feeling of urgency. Baseline:  Goal status: DEFERRED SECONDARY TO PT NOT DOING PELVIC FLOOR PT ANYMORE   PLAN:  PT FREQUENCY: 1x/week X 2 MORE VISITS  PT DURATION: 2 more visits within next 5 weeks  PLANNED INTERVENTIONS: Therapeutic exercises, Therapeutic activity, Neuromuscular re-education, Balance training, Gait training, Patient/Family education, Self Care, Joint mobilization, Dry Needling, Electrical stimulation, Cryotherapy, Moist heat, Taping, Traction, Biofeedback, Manual therapy, and Re-evaluation  PLAN FOR NEXT SESSION: hip ROM and strength, NuStep as tol, trunk stabilization, STM cervical/thoracic/lumbar  Lean Jaeger, PT 07/06/23 1:30  PM

## 2023-07-07 DIAGNOSIS — R3989 Other symptoms and signs involving the genitourinary system: Secondary | ICD-10-CM | POA: Diagnosis not present

## 2023-07-07 DIAGNOSIS — R32 Unspecified urinary incontinence: Secondary | ICD-10-CM | POA: Diagnosis not present

## 2023-07-07 DIAGNOSIS — R319 Hematuria, unspecified: Secondary | ICD-10-CM | POA: Diagnosis not present

## 2023-07-13 ENCOUNTER — Ambulatory Visit: Payer: Medicare Other | Admitting: Physical Therapy

## 2023-07-13 ENCOUNTER — Encounter: Payer: Self-pay | Admitting: Physical Therapy

## 2023-07-13 DIAGNOSIS — M6281 Muscle weakness (generalized): Secondary | ICD-10-CM | POA: Diagnosis not present

## 2023-07-13 DIAGNOSIS — R293 Abnormal posture: Secondary | ICD-10-CM

## 2023-07-13 DIAGNOSIS — M4125 Other idiopathic scoliosis, thoracolumbar region: Secondary | ICD-10-CM

## 2023-07-13 NOTE — Therapy (Signed)
OUTPATIENT PHYSICAL THERAPY LUMBAR Treatment       Patient Name: Janet Nguyen MRN: [id number] DOB:05-30-1932, 89 y.o., female Today's Date: 07/13/2023  END OF SESSION:  PT End of Session - 07/13/23 1149     Visit Number 11    Date for PT Re-Evaluation 08/10/23    Authorization Type medicare - needs KX at visit 15    Progress Note Due on Visit 20    PT Start Time 1149    PT Stop Time 1230    PT Time Calculation (min) 41 min    Activity Tolerance Patient limited by pain    Behavior During Therapy [facility] for tasks assessed/performed                   Past Medical History:  Diagnosis Date   Arrhythmia    History of SVT with documented PVC'S and  PAC'S  12/08/12 Nuc stress test normal LV EF 74%  Event Monitor  12/01/12-01/03/13   Atrial flutter (HCC)    Celiac disease    treated by Dr. Kinnie Scales   GERD (gastroesophageal reflux disease)    Hypertension    Intervertebral disc stenosis of neural canal of cervical region    Irregular heart beat 11/30/2012   ECHO-EF 60-65%   Osteoporosis    PMR (polymyalgia rheumatica) (HCC)    Dr. Mallie Mussel; pt states she was diagnosed 10-15 years ago, not treated at this time or any issues that she is aware of.   Scoliosis    Scoliosis    Sleep apnea 10/02/11 [facility]   Sleep study AHI -total sleep 10.3/hr  64.0/ hr during REM sleep.RDI 22.8/hr during total sleep 64.0/hr during REM sleep The lowest O2 sat during Non-REM and REM sleep was 86% and 88% respectively. 04/08/12 CPAP/BIPAP titration study [facility]   Past Surgical History:  Procedure Laterality Date   APPENDECTOMY     ruptured at age 66 and had surgery   CARDIAC CATHETERIZATION  01/27/06   CARDIOVERSION N/A 01/08/2022   Procedure: CARDIOVERSION;  Surgeon: Meriam Sprague, MD;  Location: [facility] ENDOSCOPY;  Service: Cardiovascular;  Laterality: N/A;   cataract surgery  2015   Dr. Elmer Picker; March & April 2015   Patient Active Problem  List   Diagnosis Date Noted   Dyspnea 05/30/2023   Pleural effusion 05/30/2023   Radon exposure 05/30/2023   Secondary hypercoagulable state (HCC) 12/12/2021   Hyponatremia 12/04/2021   Acute CHF (congestive heart failure) (HCC) 12/02/2021   Chronic diastolic heart failure (HCC) 12/02/2021   Atrial fibrillation with rapid ventricular response (HCC) 12/02/2021   Essential hypertension 12/02/2021   GERD without esophagitis 12/02/2021   Obstructive sleep apnea 12/02/2021   Interstitial lung disease (HCC) 12/02/2021   History of pulmonary embolism 12/02/2021   Pulmonary embolism (HCC) 11/06/2021   Bronchiectasis (HCC) 11/06/2021   Deviated septum 05/08/2020   Mass of subcutaneous tissue 05/02/2020   Vertigo 06/16/2019   Pulmonary hypertension, unspecified (HCC) 04/14/2019   Ischemic colitis (HCC) 04/14/2019   Migraine with aura and without status migrainosus, not intractable 11/08/2018   Degeneration of lumbar intervertebral disc 10/14/2018   Hoarseness of voice 03/04/2018   Abdominal pain 07/01/2017   Diverticulitis, colon    Metabolic acidosis, increased anion gap    Constipation 04/14/2017   Lumbar hernia 04/14/2017   Presbycusis of both ears 01/10/2017   Tinnitus aurium, bilateral 01/10/2017   Gastroesophageal reflux disease 08/20/2016   Hemoptysis 08/20/2016   Obstructive sleep apnea  of adult 08/20/2016   Rhinitis, chronic 08/20/2016   Throat pain in adult 08/20/2016   Fatigue 12/23/2015   Sciatica of right side 10/06/2015   History of migraine headaches 10/06/2015   Frequent PVCs 12/28/2013   Premature atrial contractions 12/28/2013   PSVT (paroxysmal supraventricular tachycardia) (HCC) 12/28/2013   Heart palpitations 07/13/2013   Sleep apnea 04/11/2013   Scoliosis 04/11/2013   Atypical atrial flutter (HCC) 11/29/2012   Chest pain, atypical 11/29/2012   Fibromyalgia syndrome 11/29/2012   Chronic steroid use 11/29/2012    PCP: [name], MD   REFERRING  PROVIDER:    Randa Spike, FNP    REFERRING DIAG: R39.89 (ICD-10-CM) - Other symptoms and signs involving the genitourinary system   THERAPY DIAG:  Muscle weakness (generalized)  Abnormal posture  Other idiopathic scoliosis, thoracolumbar region  Rationale for Evaluation and Treatment: Rehabilitation  ONSET DATE: started earlier this year  SUBJECTIVE:                                                                                                                                                                                           SUBJECTIVE STATEMENT: The top 1/2 of my body is having terrible rashes.   I am not doing as well today and need to skip the exercise.  PAIN:  Are you having pain? Yes NPRS scale: 7/10  Pain location: External  Pain type: sharp in hips  Pain description: intermittent   Aggravating factors: just always irritated Relieving factors: not sure  PRECAUTIONS: None  RED FLAGS: None   WEIGHT BEARING RESTRICTIONS: No  FALLS:  Has patient fallen in last 6 months? No  LIVING ENVIRONMENT: Lives with: lives alone Lives in: House/apartment   OCCUPATION: retired - exercise walking with shopping cart  PLOF: Independent  PATIENT GOALS: have less leakage/urgency  PERTINENT HISTORY:  Scoliosis, lung and heart disease, 4 vaginal deliveries   BOWEL MOVEMENT: Pain with bowel movement: No Type of bowel movement:Type (Bristol Stool Scale) solid and normal usually, skinny, Frequency daily, and Strain No Fully empty rectum: Yes: I think so Leakage: Yes:   Pads: Yes: 1 at night and 1 morning unless something happens Fiber supplement: No  URINATION: Pain with urination: No Fully empty bladder: Yes: I don't know Stream:  not sure Urgency: Yes: more than once a week Frequency: sometimes every hour, sometimes 1-2/day; wakes up due to back and then I just go to the bathroom Leakage: Urge to void and Walking to the bathroom Pads: Yes: 1 in  morning and 1 at night   PREGNANCY: Vaginal deliveries 4   PROLAPSE:    OBJECTIVE:  06/04/23: SpO2: 98% HR: 84  DIAGNOSTIC FINDINGS:    PATIENT SURVEYS:    COGNITION: Overall cognitive status: Within functional limits for tasks assessed       GAIT:  Comments: wide BOS  POSTURE:  severe scoliosis  PELVIC ALIGNMENT:  LUMBARAROM/PROM: See ortho notes  LOWER EXTREMITY ROM:  See ortho notes  LOWER EXTREMITY MMT:  See ortho notes PALPATION:                  External Perineal Exam redness of vulva, small amount of stool                             Internal Pelvic Floor - not fully relax in between reps, hold for 2 seconds only, 5 quick flicks with diminishing strength  Patient confirms identification and approves PT to assess internal pelvic floor and treatment Yes  PELVIC MMT:   MMT eval  Vaginal   Internal Anal Sphincter 3/5  External Anal Sphincter 3/5  Puborectalis 3+/5  Diastasis Recti   (Blank rows = not tested)        TONE: Slightly lower  PROLAPSE: Not noted (did not do anterior pelvic assessment)  TODAY'S TREATMENT:                                                                                                                              DATE:  07/13/23: Rt SL STM to thoracic and lumbar paraspinals, Lt QL, Lt obliques, Lt lat, intrascapular Seated bil pectorals, upper quadrant STM  07/06/23: NuStep L1 x 5' PT present to discuss status Seated ball squeeze 5x5" Seated yellow loop hip abd x20 Sit to stand x5 with focused glut squeeze Supine passive HS stretch bil, hip ROM bil, light traction through LE Rt and Lt Seated manual therapy: bil upper traps, rhomboids, cervical paraspinals STM, thoracic paraspinal STM, lumbar STM Continued discussion around need for social connections, finding hired help at home for cleaning   06/22/23: Seated bil upper quadrant broadening Rt SL STM to thoracic and lumbar paraspinals, Lt QL, Lt obliques,  Lt lat, intrascapular Concurrent with manual therapy: discussion about ways to find social calendars and groups to plug into, trying to avoid negative thought and verbalization cycling  06/15/23: NuStep L5 x 3' - Pt limited by SOB and fatigue and disinterest in exercise Rt SL STM to thoracic and lumbar paraspinals, Lt QL, Lt obliques, Lt lat, intrascapular Supine: SKTC, DKTC, HS stretch with ankle pumps and circles, fig 4 stretch, LTR, bridge x10 Seated bil upper quadrant broadening    PATIENT EDUCATION:  Education details: Access Code: [email or web address] Person educated: Patient Education method: Explanation and Handouts Education comprehension: verbalized understanding  HOME EXERCISE PROGRAM: Access Code: [email or web address] URL: [email or web address] Date: 04/27/2023 Prepared by: Dwana Curd  Exercises - Clamshell  - 1 x daily - 7 x weekly - 3 sets - 10 reps - Beginner Reverse Clamshell  -  1 x daily - 7 x weekly - 3 sets - 10 reps  [email or web address] new pelvic floor program   ASSESSMENT:  CLINICAL IMPRESSION: Pt declined warm up on NuStep today due to pain and fatigue.  She showed PT a rash she has with red welts in Rt abdominal area and Lt axilla.  Pt is working on [employer] for mobility, stretching and has some online 10 min videos she is working on for endurance and standing strength/balance.  She is ready to d/c to HEP for now and will likely return early next year as she greatly benefits from manual therapy to address adaptive shortening of muscles secondary to degenerative scoliosis.  OBJECTIVE IMPAIRMENTS: decreased coordination, decreased endurance, decreased ROM, decreased strength, impaired tone, and postural dysfunction.   ACTIVITY LIMITATIONS: lifting, bending, standing, sleeping, continence, toileting, and locomotion level  PARTICIPATION LIMITATIONS: community activity  PERSONAL FACTORS: Age, Past/current experiences, and 1-2 comorbidities: 4 vaginal deliveries, scolosis   are also affecting patient's functional outcome.   REHAB POTENTIAL: Good  CLINICAL DECISION MAKING: Evolving/moderate complexity  EVALUATION COMPLEXITY: Moderate   GOALS: Goals reviewed with patient? Yes  SHORT TERM GOALS: Target date: 05/20/23 - updated 05/06/23 - updated 06/11/23  Ind with initial HEP Baseline: Goal status: MET 05/06/23  2.  Ind with urge and toileting techniques Baseline:  Goal status: DEFERRED SECONDARY TO PT NOT DOING PELVIC FLOOR PT ANYMORE  LONG TERM GOALS: Target date: 07/15/23 - updated 05/06/23 - updated 06/11/23  Pt will report having feeling of when there is stool in the anal canal (can feel it coming out) Baseline:  Goal status: DEFERRED SECONDARY TO PT NOT DOING PELVIC FLOOR PT ANYMORE  2.  Pt will be independent with advanced HEP to maintain improvements made throughout therapy  Baseline:  Goal status: MET   3.  Pt will report less pain and itchiness of the vulva due to improved skin care Baseline:  Goal status: DEFERRED SECONDARY TO PT NOT DOING PELVIC FLOOR PT ANYMORE  4.  Pt will be able to contract and hold pelvic floor for at least 10 seconds in order to reduce feeling of urgency. Baseline:  Goal status: DEFERRED SECONDARY TO PT NOT DOING PELVIC FLOOR PT ANYMORE   PLAN:  PT FREQUENCY: 1x/week X 2 MORE VISITS  PT DURATION: 2 more visits within next 5 weeks  PLANNED INTERVENTIONS: Therapeutic exercises, Therapeutic activity, Neuromuscular re-education, Balance training, Gait training, Patient/Family education, Self Care, Joint mobilization, Dry Needling, Electrical stimulation, Cryotherapy, Moist heat, Taping, Traction, Biofeedback, Manual therapy, and Re-evaluation  PLAN FOR NEXT SESSION: D/C TO HEP FOR NOW    PHYSICAL THERAPY DISCHARGE SUMMARY  Visits from Start of Care: 11  Current functional level related to goals / functional outcomes: SEE ABOVE   Remaining deficits: SEE ABOVE   Education / Equipment: HEP   Patient  agrees to discharge. Patient goals were partially met. Patient is being discharged due to maximized rehab potential.   Morton Peters, PT 07/13/23 12:40 PM

## 2023-07-14 ENCOUNTER — Encounter: Payer: Self-pay | Admitting: Internal Medicine

## 2023-07-14 ENCOUNTER — Ambulatory Visit (INDEPENDENT_AMBULATORY_CARE_PROVIDER_SITE_OTHER): Payer: Medicare Other | Admitting: Internal Medicine

## 2023-07-14 VITALS — BP 108/68 | HR 82 | Temp 97.7°F | Ht 60.0 in | Wt 108.2 lb

## 2023-07-14 DIAGNOSIS — R0609 Other forms of dyspnea: Secondary | ICD-10-CM | POA: Diagnosis not present

## 2023-07-14 DIAGNOSIS — I2699 Other pulmonary embolism without acute cor pulmonale: Secondary | ICD-10-CM

## 2023-07-14 DIAGNOSIS — I5032 Chronic diastolic (congestive) heart failure: Secondary | ICD-10-CM | POA: Diagnosis not present

## 2023-07-14 NOTE — Patient Instructions (Addendum)
DOE (dyspnea on exertion)   - PE in Jan 2023, scoliosis, bronchiectasis, mitral valve leak and and grade 2 heart muscle stiffness, atrial flutter contributing to respiratory symptoms - Currently stable   Plan - supportive care - Address individual components below  #Pulmonary embolism diagnosed January 2023  - completed 1 year of total eliquis  with low dose Sept 2023-Jan 2024  Plan  -- From PE perspective you do need eliquis but you probably need it for A Fibritllation - talk to Dr Tresa Endo about need to continue eliquis v stop eliquis    #Bronchiectasis  -Spiriva sample months ago did not help and was too expensive - currently doing well - but you are taking too much albuterol  Plan  - Flutter valve 5 times daily   - retrain again 07/14/2023 by CMA - No need for spiriva - do albuterol only as needed - limit to 1-2 times per day  Right lower lobe lung nodule 7.67mm on CT 10/09/21 - new. Resolved nodule July 2023 Radon Exposure x 34 years - newly discovered   Plan  - CT only if clinical need   #history of sleep apnea with prior intolerance to cPAP in 2015-2017  Plan  = expectant followup   #Pedeal Edema R > Left  since summer 2023  - due to varicose veins and heart and lung issues. Improved Jan 2024 and some recurrence 03/02/2023 and immproved with diuresis and stocking on visit 07/14/2023    Plan  - management by PCP [name], MD and cardiology   Positive rheumatoid factor January 2023  - no early morning joint stiffnes or current warmth - not interested in plaquenil or medications if rheumatoid confirmed  PLAN shared decisio  in July 2023 and again 07/14/2023 for expectant followup  Rash - new this visit 07/14/2023   - seems like urticarial allergic rash  Plan  - please talk to PCP [name], MD   Vaccine counseling  P;lan  - defer flu shot due to rash - talk to PCP [name], MD   Followup - cancel mid nov 2024  visit  - return in 4 months - 30 minutes

## 2023-07-14 NOTE — Progress Notes (Signed)
OV 10/22/2021  Subjective:  Patient ID: Janet Nguyen, female , DOB: 1931-10-03 , age 89 y.o. , MRN: [id number] , ADDRESS: [address] [place] [address] PCP [name], MD Patient Care Team: [name], MD as PCP - General (Family Medicine) Lennette Bihari, MD as PCP - Cardiology (Cardiology)  This Provider for this visit: Treatment Team:  Attending Provider: Kalman Shan, MD    10/22/2021 -   Chief Complaint  Patient presents with   Consult    Pt had a recent CT performed which is the reason for today's visit.     HPI Janet Nguyen 89 y.o. -referred for shortness of breath and cough.  Has known severe scoliosis.  Found to have bronchiectasis on the CT chest.  History is provided by the patient and review of the medical records.  She is a very good historian.  She is divorced.  She has grown children who all live all over the country.  She has several grandchildren.  She says she is known to have deviated nasal septum, celiac disease for which she is on a restrictive diet, sleep apnea but she cannot tolerate CPAP.  She is most importantly known to have severe scoliosis that was first diagnosed in her younger years.  She says after age 87 is around when they made the diagnosis although she suspect she has had a longer period she is just on observation treatment.  She did yoga and severe exercise to keep herself physically fit.  Over the years scoliosis is gotten worse that she is lost 6 inches in total height.  This is producing physical pressure effects.  She thinks with the onset of the pandemic and particularly with worsening of the scoliosis and particularly for the last few months or so she said dyspnea on exertion [echo did show grade 2 diastolic dysfunction], decreased socializing, decreased sleep, feeling depressed.  Also she feels her migraines are worse.  She when she wakes up in the morning she feels an elephant is on the chest.   She also has a cough.  The respiratory symptoms are mild overall.  But she does definitely have it and is definitely new.  Sometime back a year ago or 2 years ago she is to be able to walk at least a mile now she gets dyspneic walking a block.  Of note she reports a few episodes of severe vomiting acute episodes 2 times in 2022.  Idiopathic.  There is associated heartburn  She has had the COVID-vaccine but does not have the COVID itself  Review of system positive for fatigue for the last several months arthralgia for the last several years.  Denies any family history of lung disease  Denies any substance abuse  Lives in a 87 year old home.  Detailed review shows no organic dust antigen exposure.  She does have a bird feeder.  Occupational history: Detail organic and inorganic antigen exposure history is negative    CT Chest data 10/09/21 -    IMPRESSION: 1. 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 Chronic aspiration could have a similar appearance. 2. Part solid nodule of the right lower lobe measuring 7.5 mm in mean diameter with 4 mm solid component. Follow-up non-contrast CT recommended at 3-6 months to confirm persistence. If  unchanged, and solid component remains <6 mm, annual CT is recommended until 5 years of stability has been established. If persistent these nodules should be considered highly suspicious if the solid component of the nodule is 6 mm or greater in size and enlarging. This recommendation follows the consensus statement: Guidelines for Management of Incidental Pulmonary Nodules Detected on CT Images: From the Fleischner Society 2017; Radiology 2017; 284:228-243. 3.  Aortic Atherosclerosis (ICD10-I70.0).     Electronically Signed   By: Allegra Lai  M.D.   On: 10/09/2021 15:34  No results found.   11/06/2021 Follow up : PE   89 yo female never smoker seen for pulmonary consult 10/22/21 for bronchiectasis and shortness of breath found to have Pulmonary embolism on VQ scan .  Medical history of severe scoliosis /kyphosis , OSA -CPAP intolerant   TEST/EVENTS :  VQ scan 10/25/21- multiple moderate perfusion defects with RML/RLL + PE.  Venous Doppler 09/2021 neg  Echo Nml EF , Gr 2 DD, Elevated PAP . Severe LAE and Mod RA, mod to severe MR   Patient returns for a 1 week follow up . Recently seen for pulmonary consult for bronchiectasis and shortness of breath. Labs showed elevated  D Dimer . Subsequent VQ scan on 10/25/21 was positive for PE. 2 D echo showed mild elevated PAP at , Gr 2 DD , Severe LAE and Mod RA, mod to severe MR. Venous doppler was negative for DVT.  Has some minimally productive dry cough. Was started on Spiriva but did not take.  She was started on Eliquis , she has started on starter pack .  Unfortunately patient's insurance does not cover and prescription is greater than $600.  We discussed patient assistance program.  We have also contacted our pharmacy department to help [place] with alternative options.  Patient education on anticoagulation therapy.  Patient says she has some minimum daily cough.  She was started on Spiriva last visit.  Unfortunately did not start this.  We discussed the reason for Spiriva.  Went over her recent CT chest that shows some bronchiectasis.  Autoimmune labs did show elevated rheumatoid factor and ANA, CCP was negative.  Patient has some mild arthritis but no significant joint swelling.  Sjogren's panel was negative. Typically active with light walking. Limited with back pain with scoliosis.  Lives alone. Drives. Does light housework , shopping. Has 4 kids, lots of grandkids.   11/22/2021 Follow Up PE/ Bronchiectasis/ Exertional dyspnea  Janet Nguyen is a 89 y.o. female never  smoker seen for pulmonary consult 10/22/21 for bronchiectasis and shortness of breath. Additionally she was  found to have Pulmonary embolism on VQ scan . PE is unprovoked.  May need to consider lifelong anticoagulation or at least for 6 months to 1 year of therapy.  Will need to weigh the risk and benefit ratio.She is followed by Dr. Marchelle Gearing   Pt. Presents for follow up . She states she has been doing well. She has been compliant with her  Eliquis. No bleeding.  She does not use NSAIDS. We are working on getting her reduced cost medication through the pharmacy. She understands she cannot skip a dose of Eliquis. .We reviewed bleeding precautions and safe use of sharp objects, in addition to fall precautions. She denies any lower extremity swelling.No bruising noted  She does continue to have exertional dyspnea.  She states she has been compliant with her Spiriva. She states she is unsure if it is helping or not, but is  willing to try this for a bit longer.    She is using her flutter valve 8-10 times a day. 2-4 blows at a time.She states she has not been able to cough anything up. We will add Mucinex to see if this helps. We discussed that this is a daily maintenance for her bronchiectasis. She is very compliant. She has had many questions today, that we have addressed.    PFT's look restrictive, which is expected with her degree of scoliosis.  DLCO with mild/Moderate reduction  Lung volumes are slightly reduce  OV 11/28/2021  Subjective:  Patient ID: Janet Nguyen, female , DOB: 02-Dec-1931 , age 55 y.o. , MRN: [id number] , ADDRESS: [address] [place] [address] PCP [name], MD Patient Care Team: [name], MD as PCP - General (Family Medicine) Lennette Bihari, MD as PCP - Cardiology (Cardiology)  This Provider for this visit: Treatment Team:  Attending Provider: Kalman Shan, MD    11/28/2021 -   Chief Complaint  Patient presents with    Follow-up    Pt states she has been having problems with the cost of the Eliquis medication. States she took her last pill today 3/2. States that she has been more SOB than before.   #Multifactorial dyspnea  -Scoliosis, bronchiectasis seen on CT scan, mitral valve regurgitation grade 2 diastolic dysfunction and pulmonary embolism January 2023   #pulm embolism diagnosed January 2023 through VQ scan and high D-dimer  -On outpatient Eliquis  #Bronchiectasis diagnosis given genera 2023  -On Spiriva and flutter valve  #Right lower lobe lung nodule new 7.5 mm January 2023  -On surveillance  #Positive rheumatoid factor new diagnosis January 2023  -On surveillance   HPI Janet Nguyen 89 y.o. -returns for follow-up.  I saw her in January 2023.  At that time diagnosis of pulmonary embolism was made.  She is here for follow-up.  Since then she is seen nurse practitioner.  She is taking Eliquis.  Recently today she is her last day of Eliquis.  She needs to pay for Eliquis.  It is over $500.  She is struggle to come to this decision.  Pharmacy team has been in communication with me.  We had a conversation.  At first she said she did not want to pay for the Eliquis.  It appears to have done extensive research and only warfarin would be cheap and affordable.  We went through the relative risk can inconvenience features between DOAC and Warfarin.  She then decided to go with DOAC and making the more expensive payment.  She then also discussed the difference between Eliquis and Xarelto.  She believes Xarelto could be risky compared to Eliquis.  So she decided to stick with Eliquis but she asked the pharmacy team to investigate and give her an updated information.    She does have other complaints.  She is concerned about her carotid artery.  In 2017 she might of had some mild carotid stenosis according to the ultrasound report.  I asked her to follow-up with primary care physician.  She wanted to discuss  cardiac issues of mitral valve regurgitation and grade 2 diastolic dysfunction.  Have asked her to discuss this with Dr. Tresa Endo but did indicate that it is contributing to shortness of breath.  She has positive rheumatoid factor and we said we will address it at a future visit.  She does have lung nodules and she is due for a follow-up CT in  the spring 2023.  She has bronchiectasis and she said the Spiriva and flutter valve might not be helping but for now she is going to continue those.    CT Chest data  No results found.       OV 01/24/2022  Subjective:  Patient ID: Janet Nguyen, female , DOB: Dec 15, 1931 , age 67 y.o. , MRN: [id number] , ADDRESS: [address] [place] [address] PCP [name], MD Patient Care Team: [name], MD as PCP - General (Family Medicine) Lennette Bihari, MD as PCP - Cardiology (Cardiology)  This Provider for this visit: Treatment Team:  Attending Provider: Kalman Shan, MD    01/24/2022 -   Chief Complaint  Patient presents with   Follow-up    Pt states she has been doing okay since last visit. States she has been in and out of the hospital since last visit.    #Multifactorial dyspnea  -Scoliosis, bronchiectasis seen on CT scan, mitral valve regurgitation grade 2 diastolic dysfunction, atrial flutter and pulmonary embolism January 2023   #pulm embolism diagnosed January 2023 through VQ scan and high D-dimer  -On outpatient Eliquis  #Bronchiectasis diagnosis given genera 2023  -On Spiriva and flutter valve  #Right lower lobe lung nodule new 7.5 mm January 2023  -On surveillance  #Positive rheumatoid factor new diagnosis January 2023  -On surveillance  HPI Janet Nguyen 89 y.o. -returns for follow-up.  She continues on Eliquis.  She is tolerating it well.  Issues that it is very expensive she is spending more than $5 per month.  She is going to work with [employer] company trying to reduce the  cost.  I talked about limiting the Eliquis to 6 months and then going on baby aspirin based on D-dimer.  However she says she wants to work with [employer] and try to reduce the cost.  She prefers to take Eliquis as long as medically indicated.  Shortness of breath wise she is stable.  She recently was admitted for diastolic dysfunction heart failure and atrial flutter in March 2023.  I reviewed the chart.  This followed a 10 pound weight gain.  She is back to feeling baseline.    Regarding her rheumatoid factor positivity: She says she does not have much of morning stiffness and joint mobility is pretty good therefore she wants to hold off on seeing rheumatologist.    Regarding emphysem and mild bronchiectasis: She is again not using the Spiriva and flutter valve.  She again tells [place] that she has not been instructed clearly.  We actually had her for a second time and had to give instructions.  She does not recommend this.  The CMA went in and showed her the devices Spiriva Respimat.  Then she remembered it.  We gave her instructions again.  She wants to try empiric Spiriva for a time-limited trial along with flutter valve and then see if this helps her shortness of breath  Lung nodule: She has not had a 71-month CT and April 2023.  Radiologist recommended endograft 3-20-month so we will get it accordingly.        OV 04/17/2022  Subjective:  Patient ID: Janet Nguyen, female , DOB: December 27, 1931 , age 55 y.o. , MRN: [id number] , ADDRESS: [address] [place] [address] PCP [name], MD Patient Care Team: [name], MD as PCP - General (Family Medicine) Lennette Bihari, MD as PCP - Cardiology (Cardiology)  This Provider for this visit: Treatment Team:  Attending Provider: Kalman Shan, MD   04/17/2022 -   Chief Complaint  Patient presents with   Follow-up    Patient here to go over CT results.      HPI Janet Nguyen 89 y.o. -returns  for follow-up.  She continues on full dose Eliquis.  She is complaining about the cost of Eliquis.  She is wondering if this can be reduced.  She has completed 6 months of full dose treatment for pulmonary embolism.  However in the interim she has atrial fibrillation and she is on Eliquis for that.  I have return to Dr. Tresa Endo about this.  However she is continuing to have significant amount of symptoms.  She feels frustrated.  For.  She feels that nobody is listening to her and helping her address her symptoms.  However records indicate otherwise.  She also tells me that in terms of   #shortness of breath -this is not worse after the cardioversion.  She is unable to walk a few feet.  Particularly after April 2023.  Her weight was 113 pounds in April currently at 116 pounds but the weight fluctuates she states.  Also in the last 1 month  #Worsening pedal edema.  She is on Lasix and this is despite that.  She has significant varicose veins.  #Nonspecific side effects of Eliquis apart from being expensive she feels that she is "bleeding all over and she shows a body but there is no bruising or anything.  She pointed out to some discrete small macular heads of skin lesions and she says it is because of Eliquis.  #Sinus says she feels this is acting up.  She has palpitations.  She also has irritable bowel syndrome.  #Bronchiectasis she is already using a flutter valve.  She states we did not educate her currently.  Records show otherwise.  She is also not using her Spiriva.  She says she ran out.  #Lung nodule: She had CT scan of the chest shows the lung nodules resolved/very small.    CT Chest data 04/09/2022  Narrative & Impression  CLINICAL DATA:  Lung nodule.   EXAM: CT CHEST WITHOUT CONTRAST   TECHNIQUE: Multidetector CT imaging of the chest was performed following the standard protocol without IV contrast.   RADIATION DOSE REDUCTION: This exam was performed according to the departmental  dose-optimization program which includes automated exposure control, adjustment of the mA and/or kV according to patient size and/or use of iterative reconstruction technique.   COMPARISON:  10/09/2021, 07/01/2017.   FINDINGS: Cardiovascular: Atherosclerotic calcification of the aorta, aortic valve and coronary arteries. Enlarged pulmonic trunk and heart. No pericardial effusion.   Mediastinum/Nodes: Mediastinal lymph nodes are not enlarged by CT size criteria and appear similar to prior. Hilar regions are difficult to definitively evaluate without IV contrast. No axillary adenopathy. Esophagus is grossly unremarkable.   Lungs/Pleura: Scattered mucoid impaction. Central bronchiectasis. Scarring in the right middle lobe and lingula. New bilateral pleural effusions, moderate to large on the right and small to moderate on the left. Patchy ground-glass in the adjacent right lower lobe. Basilar septal thickening. Pulmonary nodules measure 4 mm or less in size, as on 10/09/2021 and likely benign. Previously described part solid nodule in right lower lobe is not visualized. Airway is unremarkable.   Upper Abdomen: Visualized portions of the liver and right adrenal gland are unremarkable. Left adrenal thickening, as before, no follow-up necessary. 1.8 cm fluid density lesion  in the upper pole left kidney, indicative of a cyst. No follow-up necessary. Visualized portions of the spleen, pancreas and stomach are unremarkable. Left lumbar hernia contains unobstructed colon.   Musculoskeletal: Degenerative changes in the spine with scoliosis. No worrisome lytic or sclerotic lesions.   IMPRESSION: 1. Previously described part solid right lower lobe nodule is not visualized. 2. Congestive heart failure. 3. Aortic atherosclerosis (ICD10-I70.0). Coronary artery calcification. 4. Enlarged pulmonic trunk, indicative of pulmonary arterial hypertension.     Electronically Signed   By:  Leanna Battles M.D.   On: 04/10/2022 15:07      No results found.   OV 07/21/2022  Subjective:  Patient ID: Janet Nguyen, female , DOB: 07/08/1932 , age 81 y.o. , MRN: [id number] , ADDRESS: [address] [place] [address] PCP [name], MD Patient Care Team: [name], MD as PCP - General (Family Medicine) Lennette Bihari, MD as PCP - Cardiology (Cardiology) Duke, Roe Rutherford, PA as Physician Assistant (Cardiology)  This Provider for this visit: Treatment Team:  Attending Provider: Kalman Shan, MD    07/21/2022 -   Chief Complaint  Patient presents with   Follow-up    PT wants instructions on how to use flutter and inhaler States itchy skin, swollen legs, SOB for months Brought list of problems to discuss with Provider    HPI Janet Nguyen 89 y.o. -returns for follow-up.  In this visit she is frustrated by all her health issues.  She is frustrated that doctors are not like the way it used to be many years ago.  She feels her problems are not being addressed.  She feels she does not have a good handle on her health issues because of lack of clear-cut communication from healthcare providers.  I did remind her that all the issues have been addressed and talk to her.  She had a list of questions all of which she asked in the past and we were answered.  I did wonder about that.  It appears the fact she lives alone is causing excess distress to her because she is 89 years old.  She acknowledged that when asked her about it.  She has 2 children living in [place] but it appears that the relationship may not be tight.  There is a daughter who is nearing retirement living in [place] which checks in on her more frequently.  However the son in [place] Patrick Soh[place] is the healthcare power of attorney.  They have never come for the visit but she says she has appraised them.  Her current symptoms are listed below.  I told her that I  believe she is currently at baseline and the best way forward is not expected to go of her condition but she needs to learn to accept what Is permanent baseline symptoms and the fact some of the symptoms might not be reversible.  She needs to learn to recognize variation and then report on the variation.  Based on that she fill out the [facility] symptom assessment score.  Also gave her a sheet to take home so she can monitor her status.  In terms of her shortness of breath this is stable  In terms of anticoagulation she is taking Eliquis.  It is now at low-dose in September 2023.  She wants to know if she can stop it.  I told her January 2024 it will be a year since pulmonary embolism and she can probably stop it  but she has atrial flutter and therefore the long-term decision will now depend on Dr. Tresa Endo  She is worried about recurrent epistaxis because of the Eliquis.  She had severe epistaxis many years ago and ended up in the emergency room.  Some weeks ago she had 1 small bout of epistaxis versus oral bleeding.  She brought a pillow covered to show that.  Since then it has not recurred.  I told her if it recurs she can call us back or Dr. Tresa Endo.   In terms of her pedal edema: The right lower extremity is more swollen than the left.  Did explain to her the multifactorial nature of this.  She is not interested in getting a Doppler of the leg.  It is better than before and therefore we opted to watch this   Lung nodule: She had a previous lung nodule that resolved.  She did find out the area where she has been living for 34 years there is increased rate on exposure.  At this point in time we resolved just to monitor this.       OV 10/30/2022  Subjective:  Patient ID: Janet Nguyen, female , DOB: 01-20-1932 , age 80 y.o. , MRN: [id number] , ADDRESS: [address] [place] [address] PCP [name], MD Patient Care Team: [name], MD as PCP - General (Family  Medicine) Lennette Bihari, MD as PCP - Cardiology (Cardiology) Duke, Roe Rutherford, PA as Physician Assistant (Cardiology)  This Provider for this visit: Treatment Team:  Attending Provider: Kalman Shan, MD    10/30/2022 -   Chief Complaint  Patient presents with   Follow-up    C/o R lower leg and foot swelling.  Thinks Eliquis is causing this.  SOB has improved over last year.  Does not remember ever taking Spiriva Respimat.     HPI Janet Nguyen 89 y.o. -returns for follow-up.  She tells me that she is actually doing better.  She is completed 1 year of Eliquis with low-dose in September 2023.  All her EKGs on external record review with Dr. Nicki Guadalajara since August 2023 through December 2023 still show atrial fibrillation.  Her most recent visit with Dr. Tresa Endo was in December 2023 and she still had atrial fibrillation.  She really wants to come off the Eliquis.  She is completed 1 year for a pulmonary embolism treatment but she now needs it for her atrial fibrillation.  I did indicate to her that she can stop her Eliquis from a pulmonary embolism standpoint but she needs it for atrial fibrillation because of stroke risk and she will have to discuss this with Dr. Tresa Endo.  I did indicate to her that she is only on a low-dose but she feels there is bruising on her back and easy bruising.  She showed me some pictures.  She believes it is coming from Eliquis.  I have deferred the indication for Eliquis to Dr. Nicki Guadalajara.  From a shortness of breath standpoint she is much better she just gets fatigued because of aging she feels.  There is no cough or wheezing.  She is only on supportive care.  She has bronchiectasis but does not do Spiriva.  She feels it is at risk for cancer but we have kept CT scan follow-up on an expectant approach.  Because she is feeling better she actually wants to switch to expectant follow-up.  I am supportive of this.  She did appreciate me  for the care.  She also  giving feedback to have actually improved with my bedside manners in the last 1 year.  She feels physicians need to spend more time.  We are seeing her 30-minute slots and this has helped her outpatient satisfaction.       OV 03/02/2023  Subjective:  Patient ID: Janet Nguyen, female , DOB: [birth year] , age 73 y.o. , MRN: [id number] , ADDRESS: [address] [place] [address] PCP [name], MD Patient Care Team: [name], MD as PCP - General (Family Medicine) Lennette Bihari, MD as PCP - Cardiology (Cardiology) Duke, Roe Rutherford, PA as Physician Assistant (Cardiology)  This Provider for this visit: Treatment Team:  Attending Provider: Kalman Shan, MD    03/02/2023 -   Chief Complaint  Patient presents with   Follow-up    F/up sob, chest tightness, pain, cough, wheezing.     HPI Janet Nguyen 89 y.o. -returns for follow-up.  At the last visit she discharged herself from follow-up.  It appears that some of the physician presumably primary care physician got a CT scan of the chest.  This showed persistence of the pleural effusions and therefore she is here for follow-up.  She tells me that overall she is doing poorly.  She says ever since she started Eliquis she is having punctate lesions in her skin that look like bug bites.  She feels that because of the Eliquis but she is tolerating it and taking the Eliquis because of atrial fibrillation.  Overall she feels she has less energy.  In fact on the [place] symptom assessment scale energy scores are slightly worse.  But more importantly she has worsening shortness of breath.  Definitely shortness of breath scores are worse.  However other symptoms are slightly better.  She also feels that her lower extremity edema is worse [she has significant varicose veins].  She is on torsemide.  She is seen cardiology.  Along with her shortness of breath is some wheezing as well.  She is unable to do  groceries.  She is frustrated by all this.  Social - She continues to live alone.  There is a son from [place] visits to every other month and does some cooking for her.  There is a son who lives locally and is retired.  She told me today that she is of English descent.  Her parents were born around [place].  All her cousins lived in [place].  Her parents immigrated to the [place] in the 1920s.  Other issues - She has anorectal prolapse.  We talked about pleural effusion.  She wondered if these things are related.  Answered in the negative.   CT Chest data 02/20/23   Narrative & Impression  CLINICAL DATA:  Follow up abnormal findings on prior imaging test. Multiple complaints. No given history of malignancy.   EXAM: CT CHEST WITHOUT CONTRAST   TECHNIQUE: Multidetector CT imaging of the chest was performed following the standard protocol without IV contrast.   RADIATION DOSE REDUCTION: This exam was performed according to the departmental dose-optimization program which includes automated exposure control, adjustment of the mA and/or kV according to patient size and/or use of iterative reconstruction technique.   COMPARISON:  Chest CT 04/09/2022 and 10/09/2021. Radiographs 12/02/2021.   FINDINGS: Cardiovascular: Atherosclerosis of the aorta, great vessels and coronary arteries, similar to previous study. There are aortic valvular calcifications. Stable mild cardiomegaly and central enlargement of the pulmonary arteries.  No significant pericardial effusion.   Mediastinum/Nodes: There are no enlarged mediastinal, hilar or axillary lymph nodes.Scattered small mediastinal lymph nodes are similar to the previous study. Hilar assessment is limited by the lack of intravenous contrast, although the hilar contours appear unchanged. The thyroid gland, trachea and esophagus demonstrate no significant findings.   Lungs/Pleura: No interval change from the most recent study in  the moderate partially loculated right and small dependent left pleural effusions. Little change in appearance of the lungs with central airway thickening, mosaic attenuation and compressive atelectasis in both lower lobes. There is chronic atelectasis or scarring in the right middle lobe and lingula. There is a new small focus of airspace disease or atelectasis medially in the left upper lobe (image 48/4). No suspicious pulmonary nodules.   Upper abdomen: The visualized upper abdomen appears stable, without significant findings. Unchanged thickening of the left adrenal gland and probable small cyst in the upper pole of the left kidney for which no follow-up imaging is recommended.   Musculoskeletal/Chest wall: There is no chest wall mass or suspicious osseous finding. Multilevel spondylosis associated with a convex right thoracolumbar scoliosis.   IMPRESSION: 1. No significant change in the moderate partially loculated right and small dependent left pleural effusions with associated compressive atelectasis in both lower lobes. Consider thoracentesis if the etiology for these effusions is unknown. 2. New small focus of airspace disease or atelectasis medially in the left upper lobe. 3. No suspicious pulmonary nodules. 4. Stable cardiomegaly and central enlargement of the pulmonary arteries consistent with underlying pulmonary arterial hypertension. 5. Stable small mediastinal lymph nodes, likely reactive. 6.  Aortic Atherosclerosis (ICD10-I70.0).     Electronically Signed   By: Carey Bullocks M.D.   On: 02/25/2023 14:32       OV 07/14/2023  Subjective:  Patient ID: Janet Nguyen, female , DOB: [birth year] , age 69 y.o. , MRN: [id number] , ADDRESS: [address] [place] [address] PCP [name], MD Patient Care Team: [name], MD as PCP - General (Family Medicine) Lennette Bihari, MD as PCP - Cardiology (Cardiology) [facility], Roe Rutherford,  PA as Physician Assistant (Cardiology)  This Provider for this visit: Treatment Team:  Attending Provider: Kalman Shan, MD    07/14/2023 -   Chief Complaint  Patient presents with   Follow-up    C/o tired a lot, sob with exertion,no cough, rash on trunk area itchy     HPI Janet Nguyen 89 y.o. -returns for follow-up.  She had a scheduled follow-up mid November 2024 but she got the month wrong and she showed up in the clinic today and we accommodated her.  It appears to me that she is having a harder time feeling questions that require cognitive processing.  In addition her memory is not serving her well.  She has multitude of complaints.  Mostly fatigue.  These are documented below.  Review of this indicates they are stable.  Individually speaking     #Multifactorial dyspnea  -Scoliosis, bronchiectasis seen on CT scan, mitral valve regurgitation grade 2 diastolic dysfunction, atrial flutter and pulmonary embolism January 2023   -Visit October 2024: Dyspnea stable  #New onset right greater than left small to moderate pleural effusions July 2023 [absent in January 2023)  -Saw Dr. Nicki Guadalajara September 2024: BNP was high.  Diuresis adjusted and she is better edema is also better.   #pulm embolism diagnosed January 2023 through VQ scan and high D-dimer [normal Dopplers] #Atrial flutter  managed by Dr. Tresa Endo  -On outpatient Eliquis -since January 2023: Switch to low-dose Eliquis September 2023 for risk mitigation  #Bronchiectasis diagnosis given genera 2023   - Spiriva samples given earlier in the year 2023 but did not use due to cost and lack of efficacy  -Flutter valve recommended but only with sporadic resultant use.  -October 2020 for: Not using flutter valve.  Using a lot of albuterol.  Advised her to cut back on the albuterol.  #Radon exposure #Right lower lobe lung nodule new 7.5 mm January 2023    -On surveillance/expectant follow-up   - no nodules reported July  2023 AND MAy 2024  #Positive rheumatoid factor new diagnosis January 2023  -On surveillance  -Looked at her hands they are not warm.  The joints are not warm.  There is no early morning stiffness.  Indicated fatigue might be related to rheumatoid arthritis.  Does not want to see rheumatology because she not interested in Plaquenil  #Remote history of epistaxis resulting in emergency room visit posing a concern for patient on Eliquis  #Chronic pedal edema with decompensation summer 2023 right greater than left -Long history of varicose veins and cor pulmonale considered etiologies -Improved October 2024  #New onset rash: Since September 2020 for having urticarial rash.  Therefore would defer the flu shot.  Told her to talk to primary care.  It is on her right upper quadrant and also in her thorax particularly in her back.   [place] Symptom Assessment Numerical Scale 0 is no problem -> 10 worst problem 07/21/2022   03/02/2023  07/14/2023   No Pain -> Worst pain 0 5 5  No Tiredness -> Worset tiredness 4 5 6   No Nausea -> Worst nausea 2 0 0  No Depression -> Worst depression 8 5 6   No Anxiety -> Worst Anxiety 8 5 4   No Drowsiness -> Worst Drowsiness 8 3 2   Best appetite-> Worst Appetitle 4 1 0  Best Feeling of well being -> Worst feeling 8 1 1   No dyspnea-> Worst dyspnea 2 8 3   Other problem (none -> severe) RLE > LLE Edema  x  Othre issues Epistaxis mild x 1 6   Completed by  patoient           PFT     Latest Ref Rng & Units 10/22/2021   11:26 AM  ILD indicators  FVC-Pre L 1.79   FVC-Predicted Pre % 89   FVC-Post L 1.78   FVC-Predicted Post % 89   TLC L 4.21   TLC Predicted % 89   DLCO uncorrected ml/min/mmHg 10.42   DLCO UNC %Pred % 61   DLCO Corrected ml/min/mmHg 10.42   DLCO COR %Pred % 61       LAB RESULTS last 96 hours No results found.  LAB RESULTS last 90 days No results found for this or any previous visit (from the past 2160 hour(s)).       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   reports that she has never smoked. She has never used smokeless tobacco.  Past Surgical History:  Procedure Laterality Date   APPENDECTOMY     ruptured at age 34 and had surgery   CARDIAC CATHETERIZATION  01/27/06   CARDIOVERSION N/A  01/08/2022   Procedure: CARDIOVERSION;  Surgeon: Meriam Sprague, MD;  Location: [facility] ENDOSCOPY;  Service: Cardiovascular;  Laterality: N/A;   cataract surgery  2015   Dr. Elmer Picker; March & April 2015    Allergies  Allergen Reactions   Gluten Meal Other (See Comments)    Unknown   Naproxen Other (See Comments)    Stomach upset    Immunization History  Administered Date(s) Administered   Fluad Quad(high Dose 65+) 07/11/2022   Influenza Split 06/26/2014   Influenza, High Dose Seasonal PF 06/15/2015, 06/19/2016, 07/24/2017, 05/24/2019, 06/20/2021   Influenza,inj,quad, With Preservative 06/29/2018   Influenza-Unspecified 06/30/2013   PFIZER(Purple Top)SARS-COV-2 Vaccination 10/14/2019, 11/04/2019, 07/18/2020, 07/01/2021   Pneumococcal Conjugate-13 03/09/2014   Tdap 03/21/2009, 05/29/2021    Family History  Problem Relation Age of Onset   Breast cancer Mother    Heart disease Father    Migraines Neg Hx      Current Outpatient Medications:    acetaminophen (TYLENOL) 500 MG tablet, Take 500 mg by mouth every 8 (eight) hours as needed for moderate pain., Disp: , Rfl:    albuterol (VENTOLIN HFA) 108 (90 Base) MCG/ACT inhaler, Inhale 2 puffs into the lungs every 4 (four) hours as needed., Disp: , Rfl:    Aloe-Sodium Chloride (AYR SALINE NASAL GEL NA), Place 1 application  into the nose daily as needed  (rhinitis)., Disp: , Rfl:    ALPRAZolam (XANAX) 0.5 MG tablet, Take 0.25-0.5 mg by mouth 2 (two) times daily as needed for anxiety., Disp: , Rfl:    apixaban (ELIQUIS) 2.5 MG TABS tablet, Take 2.5 mg by mouth 2 (two) times daily., Disp: , Rfl:    Biotin w/ Vitamins C & E (HAIR SKIN & NAILS GUMMIES PO), Take 1 tablet by mouth daily., Disp: , Rfl:    Calcium Carb-Ergocalciferol (CHEWABLE CALCIUM/D PO), Take 1 each by mouth daily., Disp: , Rfl:    clidinium-chlordiazePOXIDE (LIBRAX) 5-2.5 MG capsule, Take 1 capsule by mouth daily as needed., Disp: 60 capsule, Rfl: 3   digoxin (LANOXIN) 0.125 MG tablet, TAKE 1/2 TABLET BY MOUTH DAILY, Disp: 45 tablet, Rfl: 3   diltiazem (CARDIZEM CD) 120 MG 24 hr capsule, TAKE 1 CAPSULE BY MOUTH DAILY, Disp: 90 capsule, Rfl: 3   Doxylamine-Phenylephrine-APAP (SINUS & CONGESTION DAY/NIGHT) MISC, 1 tablet Orally once a day As needed, Disp: , Rfl:    fluticasone (FLONASE) 50 MCG/ACT nasal spray, Place 1 spray into both nostrils daily as needed for allergies or rhinitis., Disp: , Rfl:    metoprolol succinate (TOPROL-XL) 50 MG 24 hr tablet, Take 2 tablets (100 mg) in the morning.  Take  1 &1/2 tablets (75mg ) in the evening. Take with or immediately following a meal., Disp: 315 tablet, Rfl: 3   nitrofurantoin, macrocrystal-monohydrate, (MACROBID) 100 MG capsule, Take 100 mg by mouth 2 (two) times daily., Disp: , Rfl:    Phenylephrine-APAP-guaiFENesin (EQ SINUS CONGESTION & PAIN PO), Take 1 tablet by mouth daily as needed (for sinus pain). Contains acetaminophen 325 mg and Phenylephrine 5 mg, Disp: , Rfl:    polyethylene glycol powder (GLYCOLAX/MIRALAX) 17 GM/SCOOP powder, Take 17 g by mouth daily as needed for moderate constipation., Disp: , Rfl:    potassium chloride (KLOR-CON M) 10 MEQ tablet, TAKE 1 TABLET BY MOUTH DAILY, Disp: 90 tablet, Rfl: 3   Tiotropium Bromide Monohydrate (SPIRIVA RESPIMAT) 1.25 MCG/ACT AERS, Inhale 2 puffs into the lungs daily., Disp: 4 g, Rfl: 5    torsemide (DEMADEX) 20 MG tablet, Take 2 tablets (40  mg total) by mouth daily. Take 1 Tablet in the Morning and 1 Tablet in the evening, Disp: 180 tablet, Rfl: 3   zolpidem (AMBIEN) 5 MG tablet, Take 2.5 mg by mouth at bedtime., Disp: , Rfl:    mirtazapine (REMERON) 15 MG tablet, Take 15 mg by mouth at bedtime. (Patient not taking: Reported on 07/14/2023), Disp: , Rfl:       Objective:   Vitals:   07/14/23 1055  BP: 108/68  Pulse: 82  Temp: 97.7 F (36.5 C)  TempSrc: Temporal  SpO2: 91%  Weight: 108 lb 3.2 oz (49.1 kg)  Height: 5' (1.524 m)    Estimated body mass index is 21.13 kg/m as calculated from the following:   Height as of this encounter: 5' (1.524 m).   Weight as of this encounter: 108 lb 3.2 oz (49.1 kg).  @WEIGHTCHANGE @  [employer]   07/14/23 1055  Weight: 108 lb 3.2 oz (49.1 kg)     Physical Exam   General: No distress.  O2 at rest: no Cane present: no Sitting in wheel chair: no Frail: mild Obese: no Neuro: Alert and Oriented x 3. GCS 15. Speech normal Psych: Pleasant Resp:  Barrel Chest - no.  Wheeze - no, Crackles - no, No overt respiratory distress CVS: Normal heart sounds. Murmurs - no Ext: Stigmata of Connective Tissue Disease - no but has some scoliosis HEENT: Normal upper airway. PEERL +. No post nasal drip        Assessment:       ICD-10-CM   1. DOE (dyspnea on exertion)  R06.09     2. Chronic diastolic heart failure (HCC)  W09.81     3. Pulmonary embolism, other, unspecified chronicity, unspecified whether acute cor pulmonale present (HCC)  I26.99          Plan:     Patient Instructions  DOE (dyspnea on exertion)   - PE in Jan 2023, scoliosis, bronchiectasis, mitral valve leak and and grade 2 heart muscle stiffness, atrial flutter contributing to respiratory symptoms - Currently stable   Plan - supportive care - Address individual components below  #Pulmonary embolism diagnosed January 2023  - completed 1 year of  total eliquis  with low dose Sept 2023-Jan 2024  Plan  -- From PE perspective you do need eliquis but you probably need it for A Fibritllation - talk to Dr Tresa Endo about need to continue eliquis v stop eliquis    #Bronchiectasis  -Spiriva sample months ago did not help and was too expensive - currently doing well - but you are taking too much albuterol  Plan  - Flutter valve 5 times daily   - retrain again 07/14/2023 by CMA - No need for spiriva - do albuterol only as needed - limit to 1-2 times per day  Right lower lobe lung nodule 7.54mm on CT 10/09/21 - new. Resolved nodule July 2023 Radon Exposure x 34 years - newly discovered   Plan  - CT only if clinical need   #history of sleep apnea with prior intolerance to cPAP in 2015-2017  Plan  = expectant followup   #Pedeal Edema R > Left  since summer 2023  - due to varicose veins and heart and lung issues. Improved Jan 2024 and some recurrence 03/02/2023 and immproved with diuresis and stocking on visit 07/14/2023    Plan  - management by PCP [name], MD and cardiology   Positive rheumatoid factor January 2023  - no early morning joint  stiffnes or current warmth - not interested in plaquenil or medications if rheumatoid confirmed  PLAN shared decisio  in July 2023 and again 07/14/2023 for expectant followup  Rash - new this visit 07/14/2023   - seems like urticarial allergic rash  Plan  - please talk to PCP [name], MD   Vaccine counseling  P;lan  - defer flu shot due to rash - talk to PCP [name], MD   Followup - cancel mid nov 2024 visit  - return in 4 months - 30 minutes   FOLLOWUP Return in about 4 months (around 11/14/2023) for 30 min visit, Dyspnea, with Dr Marchelle Gearing.  (Level 04 E&M 2024: Estb >= 30 min   visit type: on-site physical face to visit visit spent in total care time and counseling or/and coordination of care by this undersigned MD - Dr Kalman Shan. This includes one or more of the following on this same day 07/14/2023: pre-charting, chart review, note writing, documentation discussion of test results, diagnostic or treatment recommendations, prognosis, risks and benefits of management options, instructions, education, compliance or risk-factor reduction. It excludes time spent by the CMA or office staff in the care of the patient . Actual time is 30 min)   SIGNATURE    Dr. Kalman Shan, M.D., F.C.C.P,  Pulmonary and Critical Care Medicine Staff Physician, [facility] Director - Interstitial Lung Disease  Program  Pulmonary Fibrosis [facility] at [facility] [place], [place], [address]  Pager: [phone], If no answer or between  15:00h - 7:00h: call [phone] Telephone: [phone]  12:20 PM 07/14/2023

## 2023-07-15 DIAGNOSIS — R310 Gross hematuria: Secondary | ICD-10-CM | POA: Diagnosis not present

## 2023-07-15 DIAGNOSIS — N3021 Other chronic cystitis with hematuria: Secondary | ICD-10-CM | POA: Diagnosis not present

## 2023-07-20 DIAGNOSIS — Z23 Encounter for immunization: Secondary | ICD-10-CM | POA: Diagnosis not present

## 2023-07-27 ENCOUNTER — Encounter: Payer: Medicare Other | Admitting: Physical Therapy

## 2023-08-04 DIAGNOSIS — F430 Acute stress reaction: Secondary | ICD-10-CM | POA: Diagnosis not present

## 2023-08-04 DIAGNOSIS — F419 Anxiety disorder, unspecified: Secondary | ICD-10-CM | POA: Diagnosis not present

## 2023-08-04 DIAGNOSIS — G43909 Migraine, unspecified, not intractable, without status migrainosus: Secondary | ICD-10-CM | POA: Diagnosis not present

## 2023-08-04 DIAGNOSIS — Z6821 Body mass index (BMI) 21.0-21.9, adult: Secondary | ICD-10-CM | POA: Diagnosis not present

## 2023-08-04 DIAGNOSIS — G479 Sleep disorder, unspecified: Secondary | ICD-10-CM | POA: Diagnosis not present

## 2023-08-04 DIAGNOSIS — D6869 Other thrombophilia: Secondary | ICD-10-CM | POA: Diagnosis not present

## 2023-08-04 DIAGNOSIS — R0609 Other forms of dyspnea: Secondary | ICD-10-CM | POA: Diagnosis not present

## 2023-08-11 DIAGNOSIS — R159 Full incontinence of feces: Secondary | ICD-10-CM | POA: Diagnosis not present

## 2023-08-14 ENCOUNTER — Ambulatory Visit: Payer: Medicare Other | Admitting: Internal Medicine

## 2023-08-24 ENCOUNTER — Other Ambulatory Visit: Payer: Self-pay | Admitting: Gastroenterology

## 2023-08-31 DIAGNOSIS — F419 Anxiety disorder, unspecified: Secondary | ICD-10-CM | POA: Diagnosis not present

## 2023-08-31 DIAGNOSIS — M6740 Ganglion, unspecified site: Secondary | ICD-10-CM | POA: Diagnosis not present

## 2023-08-31 DIAGNOSIS — R109 Unspecified abdominal pain: Secondary | ICD-10-CM | POA: Diagnosis not present

## 2023-08-31 DIAGNOSIS — R0609 Other forms of dyspnea: Secondary | ICD-10-CM | POA: Diagnosis not present

## 2023-08-31 DIAGNOSIS — Z6820 Body mass index (BMI) 20.0-20.9, adult: Secondary | ICD-10-CM | POA: Diagnosis not present

## 2023-08-31 DIAGNOSIS — G479 Sleep disorder, unspecified: Secondary | ICD-10-CM | POA: Diagnosis not present

## 2023-09-08 NOTE — Progress Notes (Unsigned)
OV 09/08/2023  Subjective:  Patient ID: Janet Nguyen, female , DOB: [birth year] , age 89 y.o. , MRN: [id number] , ADDRESS: [address] [place] [address] PCP [name], MD Patient Care Team: [name], MD as PCP - General (Family Medicine) Lennette Bihari, MD as PCP - Cardiology (Cardiology) Duke, Roe Rutherford, PA as Physician Assistant (Cardiology)  This Provider for this visit: Treatment Team:  Attending Provider: Kalman Shan, MD    09/08/2023 -  No chief complaint on file.    HPI Janet Nguyen 89 y.o. -    CT Chest data from date: ****  - personally visualized and independently interpreted : *** - my findings are: ***   PFT     Latest Ref Rng & Units 10/22/2021   11:26 AM  ILD indicators  FVC-Pre L 1.79   FVC-Predicted Pre % 89   FVC-Post L 1.78   FVC-Predicted Post % 89   TLC L 4.21   TLC Predicted % 89   DLCO uncorrected ml/min/mmHg 10.42   DLCO UNC %Pred % 61   DLCO Corrected ml/min/mmHg 10.42   DLCO COR %Pred % 61       LAB RESULTS last 96 hours No results found.  LAB RESULTS last 90 days No results found for this or any previous visit (from the past 2160 hour(s)).       has a past medical history of Arrhythmia, Atrial flutter (HCC), Celiac disease, GERD (gastroesophageal reflux disease), Hypertension, Intervertebral disc stenosis of neural canal of cervical region, Irregular heart beat (11/30/2012), Osteoporosis, PMR (polymyalgia rheumatica) (HCC), Scoliosis, Scoliosis, and Sleep apnea (10/02/11 [facility]).   reports that she has never smoked. She has never used smokeless tobacco.  Past Surgical History:  Procedure Laterality Date   APPENDECTOMY     ruptured at age 70 and had surgery   CARDIAC CATHETERIZATION  01/27/06   CARDIOVERSION N/A 01/08/2022   Procedure: CARDIOVERSION;  Surgeon: Meriam Sprague, MD;  Location: [facility] ENDOSCOPY;  Service: Cardiovascular;   Laterality: N/A;   cataract surgery  2015   Dr. Elmer Picker; March & April 2015    Allergies  Allergen Reactions   Gluten Meal Other (See Comments)    Unknown   Naproxen Other (See Comments)    Stomach upset    Immunization History  Administered Date(s) Administered   Fluad Quad(high Dose 65+) 07/11/2022   Influenza Split 06/26/2014   Influenza, High Dose Seasonal PF 06/15/2015, 06/19/2016, 07/24/2017, 05/24/2019, 06/20/2021   Influenza,inj,quad, With Preservative 06/29/2018   Influenza-Unspecified 06/30/2013   PFIZER(Purple Top)SARS-COV-2 Vaccination 10/14/2019, 11/04/2019, 07/18/2020, 07/01/2021   Pneumococcal Conjugate-13 03/09/2014   Tdap 03/21/2009, 05/29/2021    Family History  Problem Relation Age of Onset   Breast cancer Mother    Heart disease Father    Migraines Neg Hx      Current Outpatient Medications:    acetaminophen (TYLENOL) 500 MG tablet, Take 500 mg by mouth every 8 (eight) hours as needed for moderate pain., Disp: , Rfl:    albuterol (VENTOLIN HFA) 108 (90 Base) MCG/ACT inhaler, Inhale 2 puffs into the lungs every 4 (four) hours as needed., Disp: , Rfl:    Aloe-Sodium Chloride (AYR SALINE NASAL GEL NA), Place 1 application  into the nose daily as needed (rhinitis)., Disp: , Rfl:    ALPRAZolam (XANAX) 0.5 MG tablet, Take 0.25-0.5 mg by mouth 2 (two) times daily as needed for anxiety., Disp: , Rfl:  apixaban (ELIQUIS) 2.5 MG TABS tablet, Take 2.5 mg by mouth 2 (two) times daily., Disp: , Rfl:    Biotin w/ Vitamins C & E (HAIR SKIN & NAILS GUMMIES PO), Take 1 tablet by mouth daily., Disp: , Rfl:    Calcium Carb-Ergocalciferol (CHEWABLE CALCIUM/D PO), Take 1 each by mouth daily., Disp: , Rfl:    clidinium-chlordiazePOXIDE (LIBRAX) 5-2.5 MG capsule, Take 1 capsule by mouth daily as needed., Disp: 60 capsule, Rfl: 3   digoxin (LANOXIN) 0.125 MG tablet, TAKE 1/2 TABLET BY MOUTH DAILY, Disp: 45 tablet, Rfl: 3   diltiazem (CARDIZEM CD) 120 MG 24 hr capsule, TAKE 1  CAPSULE BY MOUTH DAILY, Disp: 90 capsule, Rfl: 3   Doxylamine-Phenylephrine-APAP (SINUS & CONGESTION DAY/NIGHT) MISC, 1 tablet Orally once a day As needed, Disp: , Rfl:    fluticasone (FLONASE) 50 MCG/ACT nasal spray, Place 1 spray into both nostrils daily as needed for allergies or rhinitis., Disp: , Rfl:    metoprolol succinate (TOPROL-XL) 50 MG 24 hr tablet, Take 2 tablets (100 mg) in the morning.  Take  1 &1/2 tablets (75mg ) in the evening. Take with or immediately following a meal., Disp: 315 tablet, Rfl: 3   mirtazapine (REMERON) 15 MG tablet, Take 15 mg by mouth at bedtime. (Patient not taking: Reported on 07/14/2023), Disp: , Rfl:    nitrofurantoin, macrocrystal-monohydrate, (MACROBID) 100 MG capsule, Take 100 mg by mouth 2 (two) times daily., Disp: , Rfl:    Phenylephrine-APAP-guaiFENesin (EQ SINUS CONGESTION & PAIN PO), Take 1 tablet by mouth daily as needed (for sinus pain). Contains acetaminophen 325 mg and Phenylephrine 5 mg, Disp: , Rfl:    polyethylene glycol powder (GLYCOLAX/MIRALAX) 17 GM/SCOOP powder, Take 17 g by mouth daily as needed for moderate constipation., Disp: , Rfl:    potassium chloride (KLOR-CON M) 10 MEQ tablet, TAKE 1 TABLET BY MOUTH DAILY, Disp: 90 tablet, Rfl: 3   Tiotropium Bromide Monohydrate (SPIRIVA RESPIMAT) 1.25 MCG/ACT AERS, Inhale 2 puffs into the lungs daily., Disp: 4 g, Rfl: 5   torsemide (DEMADEX) 20 MG tablet, Take 2 tablets (40 mg total) by mouth daily. Take 1 Tablet in the Morning and 1 Tablet in the evening, Disp: 180 tablet, Rfl: 3   zolpidem (AMBIEN) 5 MG tablet, Take 2.5 mg by mouth at bedtime., Disp: , Rfl:       Objective:   There were no vitals filed for this visit.  Estimated body mass index is 21.13 kg/m as calculated from the following:   Height as of 07/14/23: 5' (1.524 m).   Weight as of 07/14/23: 108 lb 3.2 oz (49.1 kg).  @WEIGHTCHANGE @  There were no vitals filed for this visit.   Physical Exam   General: No distress.  *** O2 at rest: *** Cane present: *** Sitting in wheel chair: *** Frail: *** Obese: *** Neuro: Alert and Oriented x 3. GCS 15. Speech normal Psych: Pleasant Resp:  Barrel Chest - ***.  Wheeze - ***, Crackles - ***, No overt respiratory distress CVS: Normal heart sounds. Murmurs - *** Ext: Stigmata of Connective Tissue Disease - *** HEENT: Normal upper airway. PEERL +. No post nasal drip        Assessment:     No diagnosis found.     Plan:     There are no Patient Instructions on file for this visit.   FOLLOWUP No follow-ups on file.    SIGNATURE    Dr. Kalman Shan, M.D., F.C.C.P,  Pulmonary and Critical Care Medicine  Staff Physician, [facility] Center Director - Interstitial Lung Disease  Program  Pulmonary Fibrosis [facility] at [facility] [place], [place], [address]  Pager: [phone], If no answer or between  15:00h - 7:00h: call [phone] Telephone: [phone]  6:01 PM 09/08/2023   Moderate Complexity MDM OFFICE  2021 E/M guidelines, first released in 2021, with minor revisions added in 2023 and 2024 Must meet the requirements for 2 out of 3 dimensions to qualify.    Number and complexity of problems addressed Amount and/or complexity of data reviewed Risk of complications and/or morbidity  One or more chronic illness with mild exacerbation, OR progression, OR  side effects of treatment  Two or more stable chronic illnesses  One undiagnosed new problem with uncertain prognosis  One acute illness with systemic symptoms   One Acute complicated injury Must meet the requirements for 1 of 3 of the categories)  Category 1: Tests and documents, historian  Any combination of 3 of the following:  Assessment requiring an independent historian  Review of prior external note(s) from each unique source  Review of results of each unique test  Ordering of each unique test    Category 2: Interpretation  of tests   Independent interpretation of a test performed by another physician/other qualified health care professional (not separately reported)  Category 3: Discuss management/tests  Discussion of management or test interpretation with external physician/other qualified health care professional/appropriate source (not separately reported) Moderate risk of morbidity from additional diagnostic testing or treatment Examples only:  Prescription drug management  Decision regarding minor surgery with identfied patient or procedure risk factors  Decision regarding elective major surgery without identified patient or procedure risk factors  Diagnosis or treatment significantly limited by social determinants of health             HIGh Complexity  OFFICE   2021 E/M guidelines, first released in 2021, with minor revisions added in 2023. Must meet the requirements for 2 out of 3 dimensions to qualify.    Number and complexity of problems addressed Amount and/or complexity of data reviewed Risk of complications and/or morbidity  Severe exacerbation of chronic illness  Acute or chronic illnesses that may pose a threat to life or bodily function, e.g., multiple trauma, acute MI, pulmonary embolus, severe respiratory distress, progressive rheumatoid arthritis, psychiatric illness with potential threat to self or others, peritonitis, acute renal failure, abrupt change in neurological status Must meet the requirements for 2 of 3 of the categories)  Category 1: Tests and documents, historian  Any combination of 3 of the following:  Assessment requiring an independent historian  Review of prior external note(s) from each unique source  Review of results of each unique test  Ordering of each unique test    Category 2: Interpretation of tests    Independent interpretation of a test performed by another physician/other qualified health care professional (not separately  reported)  Category 3: Discuss management/tests  Discussion of management or test interpretation with external physician/other qualified health care professional/appropriate source (not separately reported)  HIGH risk of morbidity from additional diagnostic testing or treatment Examples only:  Drug therapy requiring intensive monitoring for toxicity  Decision for elective major surgery with identified pateint or procedure risk factors  Decision regarding hospitalization or escalation of level of care  Decision for DNR or to de-escalate care   Parenteral controlled  substances  LEGEND - Independent interpretation involves the interpretation of a test for which there is a CPT code, and an interpretation or report is customary. When a review and interpretation of a test is performed and documented by the provider, but not separately reported (billed), then this would represent an independent interpretation. This report does not need to conform to the usual standards of a complete report of the test. This does not include interpretation of tests that do not have formal reports such as a complete blood count with differential and blood cultures. Examples would include reviewing a chest radiograph and documenting in the medical record an interpretation, but not separately reporting (billing) the interpretation of the chest radiograph.   An appropriate source includes professionals who are not health care professionals but may be involved in the management of the patient, such as a Clinical research associate, upper officer, case manager or teacher, and does not include discussion with family or informal caregivers.    - SDOH: SDOH are the conditions in the environments where people are born, live, learn, work, play, worship, and age that affect a wide range of health, functioning, and quality-of-life outcomes and risks. (e.g., housing, food insecurity, transportation, etc.). SDOH-related Z codes  ranging from Z55-Z65 are the ICD-10-CM diagnosis codes used to document SDOH data Z55 - Problems related to education and literacy Z56 - Problems related to employment and unemployment Z57 - Occupational exposure to risk factors Z58 - Problems related to physical environment Z59 - Problems related to housing and economic circumstances [phone] - Problems related to social environment [phone] - Problems related to upbringing [phone] - Other problems related to primary support group, including family circumstances Z11 - Problems related to certain psychosocial circumstances Z65 - Problems related to other psychosocial circumstances

## 2023-09-09 ENCOUNTER — Encounter: Payer: Self-pay | Admitting: Internal Medicine

## 2023-09-09 ENCOUNTER — Telehealth: Payer: Self-pay | Admitting: Internal Medicine

## 2023-09-09 ENCOUNTER — Ambulatory Visit: Payer: Medicare Other | Admitting: Internal Medicine

## 2023-09-09 VITALS — HR 80 | Ht 60.0 in | Wt 110.0 lb

## 2023-09-09 DIAGNOSIS — I5032 Chronic diastolic (congestive) heart failure: Secondary | ICD-10-CM | POA: Diagnosis not present

## 2023-09-09 DIAGNOSIS — R0609 Other forms of dyspnea: Secondary | ICD-10-CM | POA: Diagnosis not present

## 2023-09-09 DIAGNOSIS — J479 Bronchiectasis, uncomplicated: Secondary | ICD-10-CM | POA: Diagnosis not present

## 2023-09-09 LAB — BASIC METABOLIC PANEL
BUN: 24 mg/dL — ABNORMAL HIGH (ref 6–23)
CO2: 28 meq/L (ref 19–32)
Calcium: 9.1 mg/dL (ref 8.4–10.5)
Chloride: 94 meq/L — ABNORMAL LOW (ref 96–112)
Creatinine, Ser: 1.21 mg/dL — ABNORMAL HIGH (ref 0.40–1.20)
GFR: 39.74 mL/min — ABNORMAL LOW (ref >60.00)
Glucose, Bld: 113 mg/dL — ABNORMAL HIGH (ref 70–99)
Potassium: 4.1 meq/L (ref 3.5–5.1)
Sodium: 132 meq/L — ABNORMAL LOW (ref 135–145)

## 2023-09-09 LAB — CBC WITH DIFFERENTIAL/PLATELET
Basophils Absolute: 0.1 10*3/uL (ref 0.0–0.1)
Basophils Relative: 0.8 % (ref 0.0–3.0)
Eosinophils Absolute: 0.1 10*3/uL (ref 0.0–0.7)
Eosinophils Relative: 1.3 % (ref 0.0–5.0)
HCT: 43.6 % (ref 36.0–46.0)
Hemoglobin: 14.5 g/dL (ref 12.0–15.0)
Lymphocytes Relative: 18.6 % (ref 12.0–46.0)
Lymphs Abs: 1.6 10*3/uL (ref 0.7–4.0)
MCHC: 33.2 g/dL (ref 30.0–36.0)
MCV: 96.2 fL (ref 78.0–100.0)
Monocytes Absolute: 1.2 10*3/uL — ABNORMAL HIGH (ref 0.1–1.0)
Monocytes Relative: 14.5 % — ABNORMAL HIGH (ref 3.0–12.0)
Neutro Abs: 5.5 10*3/uL (ref 1.4–7.7)
Neutrophils Relative %: 64.8 % (ref 43.0–77.0)
Platelets: 272 10*3/uL (ref 150.0–400.0)
RBC: 4.53 Mil/uL (ref 3.87–5.11)
RDW: 14.8 % (ref 11.5–15.5)
WBC: 8.4 10*3/uL (ref 4.0–10.5)

## 2023-09-09 LAB — BRAIN NATRIURETIC PEPTIDE: Pro B Natriuretic peptide (BNP): 1135 pg/mL — ABNORMAL HIGH (ref 0.0–100.0)

## 2023-09-09 NOTE — Patient Instructions (Addendum)
Janet Nguyen (dyspnea on exertion)   - PE in Jan 2023, scoliosis, bronchiectasis, mitral valve leak and and grade 2 heart muscle stiffness, atrial flutter contributing to respiratory symptoms - Currently you are rporting progressive shortness of breath but clinically you are stable   Plan - cbc, bmet and BNP blood work 09/09/2023 - supportive care - Address individual components below  #Pulmonary embolism diagnosed January 2023  - completed 1 year of total eliquis  with low dose Sept 2023-Jan 2024 but on 09/09/2023\ - on low dose eliquis for  A Fib  Plan  -- From PE perspective you do NOT need eliquis but you probably need it for A Fibritllation - talk to Dr Tresa Endo about need to continue eliquis v stop eliquis    #Bronchiectasis  -Spiriva sample months ago did not help and was too expensive - currently doing well - Noted you wanted to try spiriva again  Plan  - Flutter valve 5 times daily   - retrain again 07/14/2023 by CMA - Spirivan respimat samples for 4 weeks - do albuterol only as needed - limit to 1-2 times per day  Right lower lobe lung nodule 7.14mm on CT 10/09/21 - new. Resolved nodule July 2023 Radon Exposure x 34 years - newly discovered   Plan  - CT only if clinical need   #history of sleep apnea with prior intolerance to cPAP in 2015-2017  Plan  = expectant followup   #Pedeal Edema R > Left  since summer 2023  - due to varicose veins and heart and lung issues. Improved Jan 2024 and some recurrence 03/02/2023 and immproved with diuresis and stocking on visit 07/14/2023 and stable 09/09/2023     Plan  - management by PCP [name], MD and cardiology   Positive rheumatoid factor January 2023  - no early morning joint stiffnes or current warmth - not interested in plaquenil or medications if rheumatoid confirmed  PLAN shared decisio  in July 2023 and again 07/14/2023 for expectant followup  Rash - new this visit 07/14/2023   - seems like  urticarial allergic rash and still ongoing on and off 09/09/2023   Plan  - please talk to PCP [name], MD   Followup  - return in 6 weeks - 30 minutes; APP viost

## 2023-09-09 NOTE — Progress Notes (Signed)
BNP very high and been like this for 5 months. Please talk to Dr Tresa Endo. I think hert is reason for shortness of breath

## 2023-09-09 NOTE — Telephone Encounter (Signed)
Can you please call PT to advise if she should take both the samples given to her AND albuterol together. Her # is [phone]

## 2023-09-11 DIAGNOSIS — N3021 Other chronic cystitis with hematuria: Secondary | ICD-10-CM | POA: Diagnosis not present

## 2023-09-12 NOTE — Telephone Encounter (Signed)
[facility] Telephone Encounter  Returned call to patient regarding Spiriva and Albuterol. Clarified with her that it is ok to take these two medications together.  However she informed me that after taking Spiriva once she had severe coughing and felt unwell. So prefers to use albuterol 2-3 times a day as needed. Offered alternative including ICS/LABA but she would like to hold off for now.  We discussed using a spacer. Offered to prescribe at [facility] location however she declined due to cost. I told her we would contact her if we had any spacer samples available on Monday.  Staff - please contact patient on Monday 09/14/23 if any spacers available for her to pick up.

## 2023-09-14 NOTE — Telephone Encounter (Signed)
Left voicemail for patient regarding spacer

## 2023-09-16 ENCOUNTER — Ambulatory Visit (HOSPITAL_COMMUNITY)
Admission: RE | Admit: 2023-09-16 | Discharge: 2023-09-16 | Disposition: A | Discharge status: Home / Self Care | Payer: Medicare Other | Attending: Gastroenterology | Admitting: Gastroenterology

## 2023-09-16 ENCOUNTER — Encounter (HOSPITAL_COMMUNITY)
Admission: RE | Disposition: A | Discharge status: Home / Self Care | Payer: Self-pay | Source: Home / Self Care | Attending: Gastroenterology

## 2023-09-16 DIAGNOSIS — R159 Full incontinence of feces: Secondary | ICD-10-CM | POA: Diagnosis not present

## 2023-09-16 HISTORY — PX: ANAL RECTAL MANOMETRY: SHX6358

## 2023-09-16 SURGERY — MANOMETRY, ANORECTAL

## 2023-09-16 MED ORDER — FLEET ENEMA RE ENEM
ENEMA | RECTAL | Status: AC
  Filled 2023-09-16: qty 1

## 2023-09-16 NOTE — Progress Notes (Signed)
Anal manometry done per protocol. Pt tolerated well without distress or complication. Balloon Expulsion test not done as patient was having pain from balloon inflations during AR

## 2023-09-17 DIAGNOSIS — R159 Full incontinence of feces: Secondary | ICD-10-CM | POA: Diagnosis not present

## 2023-09-18 ENCOUNTER — Encounter (HOSPITAL_COMMUNITY): Payer: Self-pay | Admitting: Gastroenterology

## 2023-09-21 ENCOUNTER — Telehealth: Payer: Self-pay | Admitting: Pulmonary Disease

## 2023-09-21 NOTE — Telephone Encounter (Signed)
PT calling stating she was supposed to "Meet" with Dr. Everardo All early Monday morning (today) She could not come in today but wonders if she could be seen by the weeks end. Pls call @ [phone]

## 2023-09-25 NOTE — Telephone Encounter (Signed)
Please schedule for 10/02/23 at [facility] clinic for spacer pick-up and inhaler teaching. OK to double book at 11:30 AM. Will need to notify patient of appointment and location

## 2023-09-25 NOTE — Telephone Encounter (Signed)
Left voicemail of this info for the patient.

## 2023-09-28 NOTE — Telephone Encounter (Signed)
Patient has been scheduled for 1/3 with JE at 1130. Nothing further needed.

## 2023-10-02 ENCOUNTER — Ambulatory Visit (HOSPITAL_BASED_OUTPATIENT_CLINIC_OR_DEPARTMENT_OTHER): Payer: Medicare Other | Admitting: Pulmonary Disease

## 2023-10-02 ENCOUNTER — Encounter (HOSPITAL_BASED_OUTPATIENT_CLINIC_OR_DEPARTMENT_OTHER): Payer: Self-pay | Admitting: Pulmonary Disease

## 2023-10-02 VITALS — BP 112/60 | HR 86 | Resp 14 | Ht 60.0 in | Wt 105.6 lb

## 2023-10-02 DIAGNOSIS — J479 Bronchiectasis, uncomplicated: Secondary | ICD-10-CM

## 2023-10-02 NOTE — Progress Notes (Signed)
 Subjective:   PATIENT ID: Janet Nguyen GENDER: female DOB: 01/14/1932, MRN: [id number]  Chief Complaint  Patient presents with   Consult    New to you    Reason for Visit: Follow-up   Janet Nguyen is a 89 year old female with bronchiectasis, hx PE 09/2021, hx OSA not on CPAP who presents with follow-up.  She is a patient of Dr. Geronimo and has intermittently tried Spiriva  but having difficulty handling the inhaler. Review of prior notes demonstrates she has had difficulty with flutter valve in the past as well. She is using albuterol  2-4 times a day and not on Spiriva  due to inability to use. She was also unsure if she could use Spiriva  with her Albuterol . Still has shortness of breath.  Social History:  Environmental exposures: Radon  I have personally reviewed patient's past medical/family/social history, allergies, current medications.  Past Medical History:  Diagnosis Date   Arrhythmia    History of SVT with documented PVC'S and  PAC'S  12/08/12 Nuc stress test normal LV EF 74%  Event Monitor  12/01/12-01/03/13   Atrial flutter (HCC)    Celiac disease    treated by Dr. Luis   GERD (gastroesophageal reflux disease)    Hypertension    Intervertebral disc stenosis of neural canal of cervical region    Irregular heart beat 11/30/2012   ECHO-EF 60-65%   Osteoporosis    PMR (polymyalgia rheumatica) (HCC)    Dr. Balinda; pt states she was diagnosed 10-15 years ago, not treated at this time or any issues that she is aware of.   Scoliosis    Scoliosis    Sleep apnea 10/02/11 [facility]   Sleep study AHI -total sleep 10.3/hr  64.0/ hr during REM sleep.RDI 22.8/hr during total sleep 64.0/hr during REM sleep The lowest O2 sat during Non-REM and REM sleep was 86% and 88% respectively. 04/08/12 CPAP/BIPAP titration study [facility]     Family History  Problem Relation Age of Onset   Breast cancer Mother    Heart disease Father     Migraines Neg Hx      Social History   Occupational History   Occupation: retired   Tobacco Use   Smoking status: Never   Smokeless tobacco: Never   Tobacco comments:    Never smoke 12/12/21  Vaping Use   Vaping status: Never Used  Substance and Sexual Activity   Alcohol use: Yes    Alcohol/week: 3.0 - 4.0 standard drinks of alcohol    Types: 3 - 4 Glasses of wine per week    Comment: 1 glass of wine 3-4 times weekly 12/12/21   Drug use: No   Sexual activity: Not on file    Allergies  Allergen Reactions   Gluten Meal Other (See Comments)    Unknown   Naproxen Other (See Comments)    Stomach upset   Amoxicillin  Rash     Outpatient Medications Prior to Visit  Medication Sig Dispense Refill   acetaminophen  (TYLENOL ) 500 MG tablet Take 500 mg by mouth every 8 (eight) hours as needed for moderate pain.     albuterol  (VENTOLIN  HFA) 108 (90 Base) MCG/ACT inhaler Inhale 2 puffs into the lungs every 4 (four) hours as needed.     Aloe-Sodium Chloride  (AYR SALINE NASAL GEL NA) Place 1 application  into the nose daily as needed (rhinitis).     ALPRAZolam  (XANAX ) 0.5 MG tablet Take 0.25-0.5 mg by mouth  2 (two) times daily as needed for anxiety.     apixaban  (ELIQUIS ) 2.5 MG TABS tablet Take 2.5 mg by mouth 2 (two) times daily.     digoxin  (LANOXIN ) 0.125 MG tablet TAKE 1/2 TABLET BY MOUTH DAILY 45 tablet 3   diltiazem  (CARDIZEM  CD) 120 MG 24 hr capsule TAKE 1 CAPSULE BY MOUTH DAILY 90 capsule 3   Doxylamine-Phenylephrine -APAP (SINUS & CONGESTION DAY/NIGHT) MISC 1 tablet Orally once a day As needed     metoprolol  succinate (TOPROL -XL) 50 MG 24 hr tablet Take 2 tablets (100 mg) in the morning.  Take  1 &1/2 tablets (75mg ) in the evening. Take with or immediately following a meal. 315 tablet 3   torsemide  (DEMADEX ) 20 MG tablet Take 2 tablets (40 mg total) by mouth daily. Take 1 Tablet in the Morning and 1 Tablet in the evening (Patient taking differently: Take 10 mg by mouth daily.  Take 1 Tablet in the Morning and 1 Tablet in the evening) 180 tablet 3   zolpidem  (AMBIEN ) 5 MG tablet Take 2.5 mg by mouth at bedtime.     Biotin w/ Vitamins C & E (HAIR SKIN & NAILS GUMMIES PO) Take 1 tablet by mouth daily.     Calcium Carb-Ergocalciferol (CHEWABLE CALCIUM/D PO) Take 1 each by mouth daily.     clidinium-chlordiazePOXIDE  (LIBRAX) 5-2.5 MG capsule Take 1 capsule by mouth daily as needed. (Patient not taking: Reported on 10/02/2023) 60 capsule 3   fluticasone (FLONASE) 50 MCG/ACT nasal spray Place 1 spray into both nostrils daily as needed for allergies or rhinitis. (Patient not taking: Reported on 10/02/2023)     mirtazapine (REMERON) 15 MG tablet Take 15 mg by mouth at bedtime. (Patient not taking: Reported on 10/02/2023)     nitrofurantoin, macrocrystal-monohydrate, (MACROBID) 100 MG capsule Take 100 mg by mouth 2 (two) times daily. (Patient not taking: Reported on 10/02/2023)     Phenylephrine -APAP-guaiFENesin (EQ SINUS CONGESTION & PAIN PO) Take 1 tablet by mouth daily as needed (for sinus pain). Contains acetaminophen  325 mg and Phenylephrine  5 mg (Patient not taking: Reported on 10/02/2023)     polyethylene glycol powder (GLYCOLAX /MIRALAX ) 17 GM/SCOOP powder Take 17 g by mouth daily as needed for moderate constipation. (Patient not taking: Reported on 10/02/2023)     potassium chloride  (KLOR-CON  M) 10 MEQ tablet TAKE 1 TABLET BY MOUTH DAILY (Patient not taking: Reported on 10/02/2023) 90 tablet 3   Tiotropium Bromide  Monohydrate (SPIRIVA  RESPIMAT) 1.25 MCG/ACT AERS Inhale 2 puffs into the lungs daily. (Patient not taking: Reported on 10/02/2023) 4 g 5   No facility-administered medications prior to visit.    Review of Systems  Constitutional:  Negative for chills, diaphoresis, fever, malaise/fatigue and weight loss.  HENT:  Negative for congestion.   Respiratory:  Positive for shortness of breath. Negative for cough, hemoptysis, sputum production and wheezing.   Cardiovascular:  Negative  for chest pain, palpitations and leg swelling.     Objective:   Vitals:   10/02/23 1117  BP: 112/60  Pulse: 86  Resp: 14  SpO2: 96%  Weight: 105 lb 9.6 oz (47.9 kg)  Height: 5' (1.524 m)   SpO2: 96 %  Physical Exam: General: Elderly, well-appearing, no acute distress HENT: [place], AT Eyes: EOMI, no scleral icterus Respiratory: Clear to auscultation bilaterally.  No crackles, wheezing or rales Cardiovascular: RRR, -M/R/G, no JVD Extremities:-Edema,-tenderness Neuro: AAO x4, CNII-XII grossly intact Psych: Normal mood, normal affect  Data Reviewed:  Imaging: CXR 03/02/23 - Bilateral pleural effusion  Assessment & Plan:   Discussion: HPI  --Provided inhaler teaching in clinic with spacer --START Spiriva  TWO puffs in the morning. Use the spacer to help --CONTINUE Albuterol  every 4 hours AS NEEDED  Health Maintenance Immunization History  Administered Date(s) Administered   Fluad Quad(high Dose 65+) 07/11/2022   Fluzone  Influenza virus vaccine,trivalent (IIV3), split virus 06/29/2018   Influenza Split 06/26/2014   Influenza, High Dose Seasonal PF 06/15/2015, 06/19/2016, 07/24/2017, 05/24/2019, 06/20/2021   Influenza,inj,quad, With Preservative 06/29/2018   Influenza-Unspecified 06/30/2013   PFIZER(Purple Top)SARS-COV-2 Vaccination 10/14/2019, 11/04/2019, 07/18/2020, 07/01/2021   Pneumococcal Conjugate-13 03/09/2014   Tdap 03/21/2009, 05/29/2021    No orders of the defined types were placed in this encounter. No orders of the defined types were placed in this encounter.   Return in about 3 months (around 12/31/2023) for with Dr. Geronimo.  I have spent a total time of 25-minutes on the day of the appointment reviewing prior documentation, coordinating care and discussing medical diagnosis and plan with the patient/family. Imaging, labs and tests included in this note have been reviewed and interpreted independently by me.  Sya Nestler Slater Staff, MD [facility] 10/02/2023 11:25 AM

## 2023-10-02 NOTE — Patient Instructions (Signed)
--  START Spiriva TWO puffs in the morning. Use the spacer to help --CONTINUE Albuterol every 4 hours AS NEEDED

## 2023-10-12 DIAGNOSIS — Z124 Encounter for screening for malignant neoplasm of cervix: Secondary | ICD-10-CM | POA: Diagnosis not present

## 2023-10-12 DIAGNOSIS — R159 Full incontinence of feces: Secondary | ICD-10-CM | POA: Diagnosis not present

## 2023-10-12 DIAGNOSIS — Z6820 Body mass index (BMI) 20.0-20.9, adult: Secondary | ICD-10-CM | POA: Diagnosis not present

## 2023-10-19 DIAGNOSIS — R159 Full incontinence of feces: Secondary | ICD-10-CM | POA: Diagnosis not present

## 2023-10-19 DIAGNOSIS — R0609 Other forms of dyspnea: Secondary | ICD-10-CM | POA: Diagnosis not present

## 2023-11-02 ENCOUNTER — Telehealth (HOSPITAL_BASED_OUTPATIENT_CLINIC_OR_DEPARTMENT_OTHER): Payer: Self-pay | Admitting: Pulmonary Disease

## 2023-11-02 ENCOUNTER — Telehealth (HOSPITAL_BASED_OUTPATIENT_CLINIC_OR_DEPARTMENT_OTHER): Payer: Self-pay

## 2023-11-02 NOTE — Telephone Encounter (Signed)
Patient states "out of breath all the time" even with using the spacer for spirivia. Using albuterol morning and night. Patient is afraid of going to the store and walking around due to being out of breath.   Patient states has not improved since using the spacer. Patient also requests to see Dr. Everardo All only. Is this okay? I have scheduled her pending approval for 12/09/23 for 30 minutes

## 2023-11-02 NOTE — Telephone Encounter (Signed)
Patient would like to switch to Dr Everardo All if that is ok. Please advise.

## 2023-11-05 NOTE — Telephone Encounter (Signed)
 Yes that is fine

## 2023-11-06 ENCOUNTER — Ambulatory Visit: Payer: Medicare Other | Attending: Family Medicine | Admitting: Physical Therapy

## 2023-11-06 ENCOUNTER — Encounter: Payer: Self-pay | Admitting: Physical Therapy

## 2023-11-06 DIAGNOSIS — M62830 Muscle spasm of back: Secondary | ICD-10-CM

## 2023-11-06 DIAGNOSIS — R293 Abnormal posture: Secondary | ICD-10-CM

## 2023-11-06 DIAGNOSIS — M546 Pain in thoracic spine: Secondary | ICD-10-CM | POA: Diagnosis not present

## 2023-11-06 DIAGNOSIS — G8929 Other chronic pain: Secondary | ICD-10-CM

## 2023-11-06 DIAGNOSIS — M542 Cervicalgia: Secondary | ICD-10-CM | POA: Diagnosis not present

## 2023-11-06 DIAGNOSIS — M4125 Other idiopathic scoliosis, thoracolumbar region: Secondary | ICD-10-CM

## 2023-11-06 DIAGNOSIS — M545 Low back pain, unspecified: Secondary | ICD-10-CM | POA: Insufficient documentation

## 2023-11-06 DIAGNOSIS — M6281 Muscle weakness (generalized): Secondary | ICD-10-CM | POA: Diagnosis not present

## 2023-11-06 NOTE — Telephone Encounter (Signed)
 Lm x1 for patient.  Please schedule appt with Dr. Washington Hacker when patient calls back.

## 2023-11-06 NOTE — Therapy (Signed)
 OUTPATIENT PHYSICAL THERAPY THORACOLUMBAR EVALUATION   Patient Name: Janet Nguyen MRN: [id number] DOB:March 05, 1932, 89 y.o., female Today's Date: 11/06/2023  END OF SESSION:  PT End of Session - 11/06/23 1215     Visit Number 1    Date for PT Re-Evaluation 01/01/24    Authorization Type Medicare part A/B - KX at visit 15    Progress Note Due on Visit 10    PT Start Time 1100    PT Stop Time 1200    PT Time Calculation (min) 60 min    Activity Tolerance Patient tolerated treatment well    Behavior During Therapy [facility] for tasks assessed/performed             Past Medical History:  Diagnosis Date   Arrhythmia    History of SVT with documented PVC'S and  PAC'S  12/08/12 Nuc stress test normal LV EF 74%  Event Monitor  12/01/12-01/03/13   Atrial flutter (HCC)    Celiac disease    treated by Dr. Luis   GERD (gastroesophageal reflux disease)    Hypertension    Intervertebral disc stenosis of neural canal of cervical region    Irregular heart beat 11/30/2012   ECHO-EF 60-65%   Osteoporosis    PMR (polymyalgia rheumatica) (HCC)    Dr. Balinda; pt states she was diagnosed 10-15 years ago, not treated at this time or any issues that she is aware of.   Scoliosis    Scoliosis    Sleep apnea 10/02/11 [facility]   Sleep study AHI -total sleep 10.3/hr  64.0/ hr during REM sleep.RDI 22.8/hr during total sleep 64.0/hr during REM sleep The lowest O2 sat during Non-REM and REM sleep was 86% and 88% respectively. 04/08/12 CPAP/BIPAP titration study [facility]   Past Surgical History:  Procedure Laterality Date   ANAL RECTAL MANOMETRY N/A 09/16/2023   Procedure: ANO RECTAL MANOMETRY;  Surgeon: Burnette Fallow, MD;  Location: [facility];  Service: Gastroenterology;  Laterality: N/A;   APPENDECTOMY     ruptured at age 6 and had surgery   CARDIAC CATHETERIZATION  01/27/06   CARDIOVERSION N/A 01/08/2022   Procedure: CARDIOVERSION;  Surgeon:  Hobart Powell BRAVO, MD;  [facility] END[facility];  Service: Cardiovascular;  Laterality: N/A;   cataract surgery  2015   Dr. Cleatus; March & April 2015   Patient Active Problem List   Diagnosis Date Noted   Dyspnea 05/30/2023   Pleural effusion 05/30/2023   Radon exposure 05/30/2023   Secondary hypercoagulable state (HCC) 12/12/2021   Hyponatremia 12/04/2021   Acute CHF (congestive heart failure) (HCC) 12/02/2021   Chronic diastolic heart failure (HCC) 12/02/2021   Atrial fibrillation with rapid ventricular response (HCC) 12/02/2021   Essential hypertension 12/02/2021   GERD without esophagitis 12/02/2021   Obstructive sleep apnea 12/02/2021   Interstitial lung disease (HCC) 12/02/2021   History of pulmonary embolism 12/02/2021   Pulmonary embolism (HCC) 11/06/2021   Bronchiectasis (HCC) 11/06/2021   Deviated septum 05/08/2020   Mass of subcutaneous tissue 05/02/2020   Vertigo 06/16/2019   Pulmonary hypertension, unspecified (HCC) 04/14/2019   Ischemic colitis (HCC) 04/14/2019   Migraine with aura and without status migrainosus, not intractable 11/08/2018   Degeneration of lumbar intervertebral disc 10/14/2018   Hoarseness of voice 03/04/2018   Abdominal pain 07/01/2017   Diverticulitis, colon    Metabolic acidosis, increased anion gap    Constipation 04/14/2017   Lumbar hernia 04/14/2017   Presbycusis of both ears 01/10/2017  Tinnitus aurium, bilateral 01/10/2017   Gastroesophageal reflux disease 08/20/2016   Hemoptysis 08/20/2016   Obstructive sleep apnea of adult 08/20/2016   Rhinitis, chronic 08/20/2016   Throat pain in adult 08/20/2016   Fatigue 12/23/2015   Sciatica of right side 10/06/2015   History of migraine headaches 10/06/2015   Frequent PVCs 12/28/2013   Premature atrial contractions 12/28/2013   PSVT (paroxysmal supraventricular tachycardia) (HCC) 12/28/2013   Heart palpitations 07/13/2013   Sleep apnea 04/11/2013   Scoliosis 04/11/2013   Atypical  atrial flutter (HCC) 11/29/2012   Chest pain, atypical 11/29/2012   Fibromyalgia syndrome 11/29/2012   Chronic steroid use 11/29/2012    PCP: Okey Carlin Redbird, MD  REFERRING PROVIDER: Okey Carlin Redbird, MD  REFERRING DIAG: M41.9 (ICD-10-CM) - Scoliosis  Rationale for Evaluation and Treatment: Rehabilitation  THERAPY DIAG:  Abnormal posture  Other idiopathic scoliosis, thoracolumbar region  Cervicalgia  Pain in thoracic spine  Chronic bilateral low back pain without sciatica  Muscle weakness (generalized)  Muscle spasm of back  ONSET DATE: chronic, years of progressive pain from degenerative scoliosis  SUBJECTIVE:                                                                                                                                                                                           SUBJECTIVE STATEMENT: I have been lousy.  My SOB is worse.  I am still running errands and enjoy going out as long as I can get a buggy at [facility] or [employer] and take my time.  I have talked to my neighbors about their walkers and have to admit I am inhibited about accepting the idea of getting a walker.  I am not sleeping.  I think it might be medicine side effects.   I have ongoing pain.  I feel so compressed in my stomach and left side.  I don't have much appetite or desire to eat b/c there is no room for that.  My heart and lungs are struggling because I am so compressed.    PERTINENT HISTORY:  Degenerative scoliosis, complex medical history including pulmonary HTN, frequent PVCs and a-flutter, history of cardioversion, osteoporosis Has been SOB with activity On waitlist to get into cardiologist sooner   PAIN:  PAIN:  Are you having pain? Yes NPRS scale: 7/10 Pain location: abdomen, left side of trunk, low back, intermittent bil hips/buttock/knee, Rt foot pains, neck Pain orientation: Other: see above   PAIN TYPE: aching, sharp, and tight Pain description: intermittent  and constant  Aggravating factors: varies Relieving factors: unsure   PRECAUTIONS: Other: osteoporosis  RED FLAGS: None   WEIGHT BEARING RESTRICTIONS:  No  FALLS:  Has patient fallen in last 6 months? No  LIVING ENVIRONMENT: Lives with: lives with their family and lives alone Lives in: House/apartment Stairs: No Has following equipment at home: has cane and walker but does not use  OCCUPATION: retired  PLOF: Independent with basic ADLs, Independent with household mobility without device, Independent with community mobility without device, and Needs assistance with homemaking  PATIENT GOALS:  be able to eat, improve strength for bowel control, feel less pain and more energy to expand social outlets and walking  NEXT MD VISIT: as needed  OBJECTIVE:  Note: Objective measures were completed at Evaluation unless otherwise noted.   The Patient-Specific Functional Scale  Initial:  I am going to ask you to identify up to 3 important activities that you are unable to do or are having difficulty with as a result of this problem.  Today are there any activities that you are unable to do or having difficulty with because of this?  (Patient shown scale and patient rated each activity)  Follow up: When you first came in you had difficulty performing these activities.  Today do you still have difficulty?    DIAGNOSTIC FINDINGS:    PATIENT SURVEYS:  PSEQ: 33/60 (<30 = low self-efficacy) Pain Catastrophizing Scale: 17 (>30 = clinically signif level of pain catastrophizing)  COGNITION: Overall cognitive status:  intact but high anxiety and depression      SENSATION: WFL  MUSCLE LENGTH: Adaptive shortening of Lt obliques, QL, lat secondary to rigid scoliosis  POSTURE: rounded shoulders, forward head, decreased lumbar lordosis, and increased thoracic kyphosis, severe degenerative scoliosis with Lt SB/Rt Rot, left ribcage in near contact with left ilium  PALPATION: Spasm present  in bil upper traps Fascial and muscle mobility restriction along Lt side of trunk due to adaptive shortening from scoliosis: obliques, QL, hip flexors, lat  LUMBAR ROM:   AROM eval  Flexion full  Extension NT  Right lateral flexion 20%  Left lateral flexion 40%  Right rotation 40%  Left rotation 20%   (Blank rows = not tested)  LOWER EXTREMITY ROM:    WFL, flexion is full with good hamstring length   LOWER EXTREMITY MMT:   Grossly 4/5 throughout, 4-/5 hip abduction, ER  FUNCTIONAL TESTS:    GAIT: Distance walked: within clinic Assistive device utilized: None Level of assistance: Modified independence Comments: short stride length  TREATMENT DATE:  11/06/23         Pt education about how depression can affect motivation, energy level, QOL, social isolation, and pain experience Discussed journaling and listing activities she knows she can do but isn't doing and to choose one to try  5 min rule from Cognitive Behavioral Therapy - try something for 5 min and you can stop then if you want to Discussed her mental block to getting a walker despite the health benefits it would allow her Discussed her bowel movement behaviors and diet - very little intake and inconsistent intake of protein  PATIENT EDUCATION:  Education details: Pt education (see above) Person educated: Patient Education method: Explanation Education comprehension: verbalized understanding  HOME EXERCISE PROGRAM: Journaling to outline some activities to try  ASSESSMENT:  CLINICAL IMPRESSION: Patient is a 89 y.o. female who was seen today for physical therapy evaluation and treatment for pain and debility related to severe degenerative scoliosis.  She has multiple factors contributing toward her pain experience including physical, mental, social and emotional.  She is unable to eat much secondary to  pain and compression of abdomen and its contents secondary to degenerative scoliosis which is also negatively affecting her bowel routine.  She will benefit from manual therapy, endurance training, and self-care strategies to reduce variables contributing to her QOL.  OBJECTIVE IMPAIRMENTS: cardiopulmonary status limiting activity, decreased activity tolerance, decreased coordination, decreased endurance, decreased mobility, decreased ROM, decreased strength, hypomobility, increased fascial restrictions, increased muscle spasms, impaired flexibility, impaired tone, improper body mechanics, postural dysfunction, and pain.   ACTIVITY LIMITATIONS: carrying, lifting, bending, sitting, standing, squatting, sleeping, stairs, transfers, bed mobility, continence, bathing, dressing, and locomotion level  PARTICIPATION LIMITATIONS: meal prep, cleaning, laundry, shopping, and community activity  PERSONAL FACTORS: Age, Behavior pattern, Time since onset of injury/illness/exacerbation, and 1-2 comorbidities: pulmonary HTN, a-flutter, osteoporosis, severe degenerative scoliosis  are also affecting patient's functional outcome.   REHAB POTENTIAL: Good  CLINICAL DECISION MAKING: Evolving/moderate complexity  EVALUATION COMPLEXITY: Moderate   GOALS: Goals reviewed with patient? Yes  SHORT TERM GOALS: Target date: 11/27/23  Pt will journal and discover at least 1 activity to return to that she enjoys Baseline: Goal status: INITIAL  2.  Pt will strive to eat consistent small meals throughout the day to initiate a bowel routine Baseline:  Goal status: INITIAL  3.  Pt will report 20% less pain around abdomen and trunk to allow for comfort with small meals Baseline:  Goal status: INITIAL    LONG TERM GOALS: Target date: 01/01/24  Pt will return to walking program at least 2x/week for multi-system health benefits. Baseline:  Goal status: INITIAL  2.  Pt will be ind with HEP for gluteal and pelvic  floor strength to reduce incidence of bowel leakage. Baseline:  Goal status: INITIAL  3.  Pt will improve score of PSEQ to at least 40 to demo improved self-efficacy within chronic pain. Baseline: 33 Goal status: INITIAL  4.  Pt will establish nighttime routine including Curable app, medidation, breathing and stretching routine to improve sleep stretches. Baseline:  Goal status: INITIAL  5.  Pt will report at least 50% less pain with daily tasks and eating secondary to reduced soft tissue compression surrounding scoliosis. Baseline:  Goal status: INITIAL    PLAN:  PT FREQUENCY: 1-2x/week  PT DURATION: 8 weeks  PLANNED INTERVENTIONS: 97110-Therapeutic exercises, 97530- Therapeutic activity, [id number]- Neuromuscular re-education, 97535- Self Care, [id number]- Manual therapy, Dry Needling, Joint mobilization, Spinal mobilization, Cryotherapy, and Moist heat.  PLAN FOR NEXT SESSION: review pelvic floor therex from past PT, review journal if Pt brought, ambulation for endurance, manual therapy   Cyril Railey, PT 11/06/23 12:32 PM

## 2023-11-06 NOTE — Telephone Encounter (Signed)
 The appt with Janet Nguyen has been scheduled so will close this encounter.

## 2023-11-10 ENCOUNTER — Encounter: Payer: Self-pay | Admitting: Cardiovascular Disease

## 2023-11-10 ENCOUNTER — Ambulatory Visit: Payer: Medicare Other | Attending: Cardiovascular Disease | Admitting: Cardiovascular Disease

## 2023-11-10 VITALS — BP 110/80 | HR 121 | Ht 60.0 in | Wt 108.0 lb

## 2023-11-10 DIAGNOSIS — Z86711 Personal history of pulmonary embolism: Secondary | ICD-10-CM | POA: Insufficient documentation

## 2023-11-10 DIAGNOSIS — I4819 Other persistent atrial fibrillation: Secondary | ICD-10-CM | POA: Diagnosis not present

## 2023-11-10 DIAGNOSIS — I10 Essential (primary) hypertension: Secondary | ICD-10-CM | POA: Insufficient documentation

## 2023-11-10 DIAGNOSIS — R600 Localized edema: Secondary | ICD-10-CM | POA: Diagnosis not present

## 2023-11-10 DIAGNOSIS — I340 Nonrheumatic mitral (valve) insufficiency: Secondary | ICD-10-CM | POA: Insufficient documentation

## 2023-11-10 DIAGNOSIS — M419 Scoliosis, unspecified: Secondary | ICD-10-CM | POA: Diagnosis not present

## 2023-11-10 DIAGNOSIS — Z7901 Long term (current) use of anticoagulants: Secondary | ICD-10-CM | POA: Insufficient documentation

## 2023-11-10 DIAGNOSIS — J479 Bronchiectasis, uncomplicated: Secondary | ICD-10-CM | POA: Diagnosis not present

## 2023-11-10 MED ORDER — METOPROLOL TARTRATE 100 MG PO TABS: 100.0000 mg | ORAL_TABLET | Freq: Two times a day (BID) | ORAL | 3 refills | Status: DC

## 2023-11-10 MED ORDER — METOPROLOL SUCCINATE ER 50 MG PO TB24: ORAL_TABLET | ORAL | 3 refills | Status: DC

## 2023-11-10 NOTE — Patient Instructions (Addendum)
Medication Instructions:  INCREASE THE METOPROLOL TO 100MG  TWICE A DAY. TAKE TWO (2) TABLETS DAILY.  *If you need a refill on your cardiac medications before your next appointment, please call your pharmacy*   Lab Work: No labs were ordered during today's visit.  If you have labs (blood work) drawn today and your tests are completely normal, you will receive your results only by: MyChart Message (if you have MyChart) OR A paper copy in the mail If you have any lab test that is abnormal or we need to change your treatment, we will call you to review the results.   Testing/Procedures: No procedures ordered today.    Follow-Up: At [facility], you and your health needs are our priority.  As part of our continuing mission to provide you with exceptional heart care, we have created designated Provider Care Teams.  These Care Teams include your primary Cardiologist (physician) and Advanced Practice Providers (APPs -  Physician Assistants and Nurse Practitioners) who all work together to provide you with the care you need, when you need it.  We recommend signing up for the patient portal called "MyChart".  Sign up information is provided on this After Visit Summary.  MyChart is used to connect with patients for Virtual Visits (Telemedicine).  Patients are able to view lab/test results, encounter notes, upcoming appointments, etc.  Non-urgent messages can be sent to your provider as well.   To learn more about what you can do with MyChart, go to [email or web address].    Your next appointment:   6 week(s)  Provider:   Micah Flesher, PA-C        YOU WILL FOLLOW UP WITH DR. BRIDGETTE CHRISTOPHER IN THE [place] OFFICE IN [place].  ALSO, MAKE AN APPOINTMENT TO FOLLOW UP WITH YOUR PRIMARY CARE PROVIDER.  Other Instructions Thank you for choosing [facility]!

## 2023-11-10 NOTE — Progress Notes (Unsigned)
50,000 patient ID: [name], female   DOB: 1932/06/03, 89 y.o.   MRN: [id number]      Primary  M.D.: Dr. Dorthey Nguyen  HPI: Janet Nguyen is a 89 y.o. female who presents for a 6 month followup cardiology evaluation.  Janet Nguyen has a history of documented SVT and also has PACs and PVCs  treated with beta blocker therapy. In April 2014 I further titrated her beta blocker therapy after cardiac event monitor revealed several bursts of recurrent SVT up to 177 beats per minute in March.  In July after she had had 2 episodes of chest fluttering which each lasted over 30 minutes which he did take metoprolol tartrate with relief I recommended further titration of her Toprol to 75 mg in the morning and 50 mg at night and if necessary she could further titrate this to 75 twice a day.  She has a history of obstructive sleep apnea but despite multiple attempts at CPAP utilization she has not been able to tolerate this. She was referred to Dr. Charlton Nguyen and has a customized dental appliance with mandibular advancement with improvement in some of her symptomatology. Due concern that her teeth may be moving in more recently she has has not been using customized appliance daily but has been using her old non-customized mouthguard.  At times shen has awakened abruptly from a dream with her heart pounding and a sensation of hot flashes, gasping for breath.   She also states that her scoliosis is getting worse. She does have left-sided musculoskeletal type chest pain due to her spine angulation.  Beause of her significant scoliosis, she feels she must sleep on her back.   She has a history of GERD for which she has been taking Nexium.  She presents for evaluation.  I had scheduled her for an overnight oximetry to see if she is a candidate for supplemental oxygen at nighttime since she refused to use CPAP and only very rarely uses her customized mouthpiece.  She does wear a mouthguard to reduce  bruxism.  Oximetry study was performed overnight on February 23/24.  Her mean oxygen saturation was 93.12%.  She spent 12 minutes and 16 seconds with O2 sat duration below 88% with the lowest O2 sat duration of 80%. I tried to set her up for supplemental oxygen at bedtime.  However, she has been denied for this on multiple occasions by her insurance/Medicare.  She feels that she is sleeping better.  She can only sleep in her back due to scoliosis.  Her left side is concave; her right side is convex.  She has had issues with labile blood pressure. She has had issues with lower back discomfort and sciatica leading to emergency room evaluation in November 2016.  She saw Dr. Jule Nguyen for neurosurgical evaluation. She has migraine headaches. She  Recently saw Dr. Remus Nguyen for these migraine headaches.  She has been on Toprol-XL 75 mg twice a day for her palpitations and presently denies any awareness an extra heartbeats. She takes Xanax on an as-needed basis for anxiety.  She has been taking Nexium 20, no grams every other day for GERD. She states her scoliosis is getting worse.  Has been experiencing more fatigue.  She also notes some occasional left hand numbness.  In a mouth guard but no ureteral longer uses her mandibular advancement device and not tolerate CPAP therapy for her obstructive sleep apnea.    She had experienced left-sided chest discomfort which most likely  was related to her significant scoliosis the potential neuropathy causing intermittent left arm and hand numbness.  I slightly reduced her Toprol-XL which she had been taking 150 mg daily and a 75 twice a day regimen to 75 and milligrams in the morning and 50 mg with ultimate plan to decrease this to 50 twice a day.  She states when she reduce this, she began to notice more optical migraines and resume taking the higher dose Toprol at 75 twice a day with improvement.  She has seen Dr. Remus Nguyen for her paresthesias.  She admits to significant anxiety.   She had requested zolpidem for sleep initiation and maintenance.  In the past, we had tried 6.25 slow-release version to help with sleep maintenance, but due to cost issues preferred the 5 mg sleep initiation dose.  She experiences optical migraine headaches.  She was being seen by neurologist, Dr. Vela Nguyen  She continues to have issues with her scoliosis causing discomfort in her neck and arm due to her distortion.  He tells me she underwent endoscopy and colonoscopy as well as banding of hemorrhoids.  She had a benign polyp removed.  She has issues with spastic colon.  Her sleep is better with zolpiden 5 mg.  She saw Janet Nguyen on 11/13/2016.  She had noticed that her hair was "thinning" and was concerned about this being the result of Toprol.  He suggested possibly decreasing Toprol but she preferred not until she had seen me.  Since that time, she continues to experience some optical migraines.  She has noticed some leg left neck discomfort.  She denies any patches of hair loss.  She denies differential arm weakness.  She continues to have difficulty with her scoliosis with hip pain and rib cage discomfort.   When I saw her in March 2018, there was a blood pressure differential of 118/78 in the left arm and 140/80 in the right arm.  She subsequently underwent upper extremity Doppler evaluation on 12/25/2016.  She was noted have mild heterogeneous plaque with stable 1-39% bilateral internal carotid stenoses.  She had normal subclavian arteries bilaterally.  There are patent vertebral arteries with antegrade flow.  She did not have any significant blood pressure differential and had triphasic waveforms in both her right and biphasic in her left brachial artery.  Left blood pressure was 8 mm higher than the right brachial pressure.  At times, she continues to experience some vague chest wall symptoms, which most likely related to her posture.  She denies significant shortness of breath.  She has to sleep on  her back because of her scoliosis.  Uses a chin strap to prevent oral breathing.  She is not on CPAP.   In June 2019 she had concerns about some of her medications possibly causing hair loss or memory loss.  She admits to having dry eyes.  She continues to have difficulty from her scolWsaw her in November 2019 at which time she,  felt well from a cardiac standphk and apparently had a new oral appliance made.  She used this initially but then started to notice some teeth discomfort.  Apparently this again was treated by Dr.e still admits to being tired.  She has issues with her abdomen being tight which she believes is related to her scoliosis.  Her GERD is controlled with pantoprazole.    She was seen by Dr. Dorthey Nguyen for her primary care.  She had developed a fungal infection of her toenail.  He had  given her to [place] fine 20 mg.  She has not started this and was hesitant to do this due to potential interaction with metoprolol.  She also was evaluated by Dr. Vickey Huger.  According to Janet Nguyen she did not evaluate her ophthalmic migraines.  However she was planning to do a possible home study to reassess her sleep apnea.  In the past she did not tolerate any CPAP therapy and although initially tolerated customized oral appliance this seemed to cause difficulty with movement of her teeth.  She has been using the CALM app on her smart phone to help with relaxation and improve sleep and is chronically dependent on low-dose zolpidem prescribed by Dr. Tenny Craw.  She denies chest pain.  Her palpitations are well controlled with her current dose of metoprolol 75 mils twice a day and she can to be on low-dose amlodipine 2.5 mg.    Since I saw her in June 2020, she underwent the home study by Dr. Villa Herb was done after several studies with no data.  According to Dr. Oliva Bustard note, AHI was 26.5 which was moderate and during REM sleep AHI rose to 34.9.  Snoring was not recorded.  Apparently there was discussion of  possible Occupational psychologist.  Janet Nguyen is not interested.  I saw her in October 2020 at which time she had continued issues with migraines and some vertigo for which he takes the Epley maneuver.  She continued to have discomfort related to her scoliosis with muscle discomfort on her chest.  She believes she is waking up during dreaming and not sleeping well.  Her blood pressure has increased.  At her prior evaluation, I had instituted low-dose amlodipine at 2.5 mg.  With her blood pressure elevated during that evaluation I further titrated this to 5 mg I recommended she continue taking Toprol XL 75 mg twice a day.  She did not have any episodes of palpitations or recurrent SVT.  I saw her in January 2021. She had seen  Dr. Lucia Gaskins of neurology.  She continued to experience some muscular neck aches which may be related to her scoliosis but do not sound ischemic in etiology.  She states she has some varicose veins in the right lower extremity and complains of trivial ankle swelling.  She denies palpitations.    I saw her in April 2021.  Over the previous several months she had felt well from a cardiovascular standpoint.  She continues to have issues with her scoliosis causing back issues as well as some left-sided discomfort along her rib cage.   She has sleep apnea and remotely did not tolerate CPAP or an oral appliance  She continues to be on amlodipine 5 mg daily, metoprolol succinate 75 mg twice a day both for blood pressure and her palpitations. GERD is fairly well controlled on pantoprazole. She continues to be on Librax which she states does offer improvement and she takes alprazolam on a as needed basis.  I saw her in October 2021 and since her prior evaluation she had undergone neurology evaluation by Dr. Everlena Cooper and has had migraine issues with aura since young adulthood.  An MRI of the brain in 2017 showed mild chronic small vessel ischemic changes otherwise was unremarkable.  She had developed  swelling in her leg right greater than left and has varicose veins.  She underwent lower extremity venous study which revealed no DVT but there was evidence for venous reflux in the right saphenofemoral junction, right greater saphenous vein in the  thigh and right greater saphenous vein in the calf.  She has used compression stockings in the past.  There was no plan for intervention.  She has been undergoing physical therapy.  There is no change in her intermittent rib discomfort from her scoliosis.  She denies palpitations.    I saw her in May 2022.  Over the prior 6 months she continued to have issues with her rib cage and hip bone discomfort contributed by her scoliosis.  She has had issues with varicose veins.  She has to sleep on her side.  She was recently evaluated by Dr. Shirline Frees for elevated monocytes who felt she was stable hematologically.  She denies any angina.  She is unaware of palpitations and continues to be on Toprol all succinate 75 mg twice a day.  She is also on amlodipine 5 mg for hypertension.  She has a prescription for alprazolam and rarely if ever takes this for anxiety.    When I saw her on October 10, 2021 she was continuing to have issues resulting from her scoliosis.  She was recently evaluated at [facility] and a chest CT revealed scattered areas of focal bronchiectasis and clustered nodules in the lower lungs, most pronounced in the right middle lobe and lingula.  There also was a part solid nodule of the right lower lobe measuring 7.5 mm in mean diameter with 4 mm solid component.  A follow-up noncontrast CT was recommended in 3 to 6 months.  She was experiencing increasing shortness of breath with activity and denied any anginal symptoms.  Since I saw her, she was evaluated by Dr. Marchelle Gearing in follow-up of her chest CT for pulmonary evaluation of her bronchiectasis and shortness of breath.  Laboratory revealed an elevated D-dimer.  Subsequent VQ scan on October 25, 2021 was positive for PE.  A 2D echo Doppler study showed mildly elevated pulmonary artery pressure 38 mm with grade 2 diastolic dysfunction, severe left atrial enlargement with moderate right atrial enlargement, and moderately severe MR.  Venous Doppler was negative for DVT.  She was started on Eliquis and Spiriva.  She had pulmonary follow-up on February 3 and was last seen by Dr. Marchelle Gearing on November 28, 2021.  She was hospitalized on March 6 through December 04, 2021 with increased palpitations and was found to be in atrial fibrillation.  On presentation sodium was 126 potassium 5.2.  Troponins were negative.  Chest x-ray showed cardiomegaly with bilateral atelectasis and small bilateral pleural effusions.  She was started on IV diuresis.  She was on beta-blocker with metoprolol succinate 75 mg and diltiazem was added for rate control.  Follow-up echo showed EF 60 to 65% with moderate dilation of bilateral atriums.  There is a small circumferential pericardial effusion.  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  During hospitalization she was seen by Dr. Shari Prows of cardiology.  She was discharged on December 04, 2021.  I saw her in follow-up of her hospitalization on December 18, 2021.  At that time, she felt somewhat improved.  However, her ECG revealed atrial fibrillation at 94 bpm.  She was less tachycardic.  She continues to have issues with her scoliosis.  She is on medical regimen of Eliquis 5 mg twice a day, metoprolol succinate 75 mg daily, diltiazem CD1 120 mg daily, furosemide 20 mg, as well as Spiriva.  During that evaluation, I  recommended further titration of her metoprolol succinate to 100 mg daily and that she take this in a 50 twice daily regimen for more stable blood pressure.  She continues to be on Eliquis for anticoagulation.  I schedule her for outpatient cardioversion.  Janet Nguyen  underwent DC cardioversion by Dr. Shari Prows on January 08, 2022.  She was successfully cardioverted.  Subsequently, she saw Dr. Marchelle Gearing on January 24, 2022.  She apparently was to be on Spiriva and the flutter valve but apparently was not doing this.  I saw her for follow-up on Jan 29, 2022.  At that time she stated her blood pressure had been typically in the 110-130 systolic range.  She had noticed heart rate irregularity for the last 1 to 2 weeks.  Her ECG revealed that she is back in atrial fibrillation with coarse fibrillatory waves with ventricular rate at 97.  At that time I discussed options with the patient.  With her age of 89 years old I recommended further titration of diltiazem to 180 mg daily from her present dose of 120 mg.  I did not feel she was a candidate for amiodarone in light of her recent pulmonary history and recommended she be evaluated in the atrial fibrillation clinic for consideration of possible initiation of other antiarrhythmic treatment.  She was scheduled to have a follow-up chest CT in the future with Dr. Marchelle Gearing.  She was evaluated by Alphonzo Severance on Feb 04, 2022.  At that time there was significant discussion concerning possible admission for dofetilide and reportedly she was agreeable.  He recommended that she continue diltiazem 180 mg daily, Toprol 50 mg twice a day, and Eliquis 5 mg twice a day.  Apparently, subsequently Janet Nguyen had real concerns about the hospitalization and canceled the tentatively scheduled admission.    I saw her on March 03, 2022 for follow-up cardiology evaluation.  She had reduced her diltiazem back to the 120 mg dose since she felt the 180 mg dose was causing stomach upset.  She admitted to be under significant stress trying to figure out all her health issues.  She had significant reservations about Tikosyn loading.  During that evaluation we discussed options and felt with her age rate control strategy may be most appropriate.  However in order to  improve heart rate response I added digoxin at 0.0625 mg.  I saw her on April 30, 2022.  Since her prior evaluation since she underwent pulmonary evaluation with Dr. Marchelle Gearing.  She was not satisfied with that appointment and remains frustrated.  Her CT scan had been done which showed improvement in prior lung nodule.  She was using flutter valve for her bronchiectasis.  Presently, she denies any chest pain.  She has had issues with leg swelling right greater than left and has issues with varicose veins.  With her progressive scoliosis she notes some discomfort from compression of her stomach.  She admits to some itching.  She still experiences some shortness of breath.  Her CT scan did not show any recurrent PE a pulmonary trunk was enlarged indicative of pulmonary artery hypertension.  Laboratory from July 20 showed BNP elevated at 870.  D-dimer was mildly increased at 0.73, improved from January 2023.  During that evaluation she had progressive lower extremity edema and was taking furosemide 20 mg daily.  I suggested she increase furosemide to 40 mg in the morning until the edema improved then try alternating 40 with 20 every other day.  Since I saw her,  she has been evaluated by Micah Flesher on September 7 and most recently on July 24, 2022.  Her swelling had improved.  She was concerned about Eliquis bleed risk.  She was not having chest pain.  I saw her on September 15, 2022.  At that time she was having progressive lower extremity edema bilaterally particularly most prominent in the right lower extremity up to the knees.  She has been on Eliquis now at the reduced dose of 2.5 mg twice a day, diltiazem 120 daily.  She has been taking furosemide 40 mg instead of alternating with 20 mg.  She is on metoprolol succinate 75 mg twice a day, long-acting diltiazem 120 mg and digoxin 0.0625 mg daily for additional rate control.  She takes Spiriva inhalation.  During that evaluation, 33+ edema on the right and 2+  on the left I recommended she discontinue furosemide and substituted this with torsemide.  Initially she was to take 40 mg in the morning and 20 mg in the afternoon and depending upon results can change to 20 twice a day.  She was now on a reduced dose of Eliquis.  I last saw her on December 22, 2022. Since her prior evaluation  Janet Nguyen has felt well.  Swelling significantly resolved with change to torsemide.  Presently, she is taking metoprolol succinate 75 mg twice a day, diltiazem CD120 daily, digoxin 0.125 mg daily in addition to reduced dose Eliquis 2.5 twice daily.  He continues to take torsemide now 20 mg in the morning and 10 mg in the evening.  At time she admits that she gets anxious and her heart rate speeds up.  Denies any chest pressure.  She saw Dr. Marchelle Gearing for follow-up on October 30, 2022.  She has continued using Spiriva for bronchiectasis and exertional dyspnea.  In 2023, PFTs were  felt to be restrictive expected with her degree of scoliosis.  The DLCO had mild/moderate reduction in lung volumes were slightly reduced on prior evaluation in 2023.    She underwent an echo on April 07, 2023 which demonstrated EF at 55 to 60% without wall motion abnormalities.  Moderate elevation of pulmonary artery systolic pressure with estimated RV systolic pressure at 51 mm.  She had severe biatrial enlargement.  She had a myxomatous mitral valve with moderately severe mitral regurgitation.  There was moderate holosystolic prolapse of her middle scallop of the posterior leaflet.  There was mild to moderate TR.  Her aortic valve was sclerotic without stenosis.  Moderate pulmonic regurgitation.  She was evaluated by Nils Pyle, NP on April 20, 2023.  She was in atrial fibrillation with a ventricular rate at approximately 104.  She was on metoprolol, diltiazem, digoxin, and Eliquis.  Since I last saw her, she has continued to be on reduced dose Eliquis at 2.5 mg twice a day.  Her ventricular rate has  been controlled with digoxin 0.  Over 65 mg daily, diltiazem CD1 120 mg and she has been taking metoprolol succinate 100 mg in the morning and 75 mg at night.  She takes torsemide 20 g twice a day and has not had any significant edema.  She continues to be on Spiriva..  Apparently she recently had been treated with Augmentin for recurrent chronic cystitis and hematuria and this was discontinued secondary to a rash.  She denies any chest pain.  She denies presyncope or syncope.  She presents for reevaluation.  Past Medical History:  Diagnosis Date   Arrhythmia  History of SVT with documented PVC'S and  PAC'S  12/08/12 Nuc stress test normal LV EF 74%  Event Monitor  12/01/12-01/03/13   Atrial flutter (HCC)    Celiac disease    treated by Dr. Kinnie Scales   GERD (gastroesophageal reflux disease)    Hypertension    Intervertebral disc stenosis of neural canal of cervical region    Irregular heart beat 11/30/2012   ECHO-EF 60-65%   Osteoporosis    PMR (polymyalgia rheumatica) (HCC)    Dr. Mallie Mussel; pt states she was diagnosed 10-15 years ago, not treated at this time or any issues that she is aware of.   Scoliosis    Scoliosis    Sleep apnea 10/02/11 [facility]   Sleep study AHI -total sleep 10.3/hr  64.0/ hr during REM sleep.RDI 22.8/hr during total sleep 64.0/hr during REM sleep The lowest O2 sat during Non-REM and REM sleep was 86% and 88% respectively. 04/08/12 CPAP/BIPAP titration study [facility]    Past Surgical History:  Procedure Laterality Date   ANAL RECTAL MANOMETRY N/A 09/16/2023   Procedure: ANO RECTAL MANOMETRY;  Surgeon: Willis Modena, MD;  Location: [facility] ENDOSCOPY;  Service: Gastroenterology;  Laterality: N/A;   APPENDECTOMY     ruptured at age 47 and had surgery   CARDIAC CATHETERIZATION  01/27/06   CARDIOVERSION N/A 01/08/2022   Procedure: CARDIOVERSION;  Surgeon: Meriam Sprague, MD;  Location: [facility] END[facility]COPY;  Service: Cardiovascular;   Laterality: N/A;   cataract surgery  2015   Dr. Elmer Picker; March & April 2015    Allergies  Allergen Reactions   Gluten Meal Other (See Comments)    Unknown   Naproxen Other (See Comments)    Stomach upset   Amoxicillin Rash    Current Outpatient Medications  Medication Sig Dispense Refill   acetaminophen (TYLENOL) 500 MG tablet Take 500 mg by mouth every 8 (eight) hours as needed for moderate pain.     albuterol (VENTOLIN HFA) 108 (90 Base) MCG/ACT inhaler Inhale 2 puffs into the lungs every 4 (four) hours as needed.     Aloe-Sodium Chloride (AYR SALINE NASAL GEL NA) Place 1 application  into the nose daily as needed (rhinitis).     ALPRAZolam (XANAX) 0.5 MG tablet Take 0.25-0.5 mg by mouth 2 (two) times daily as needed for anxiety.     apixaban (ELIQUIS) 2.5 MG TABS tablet Take 2.5 mg by mouth 2 (two) times daily.     Biotin w/ Vitamins C & E (HAIR SKIN & NAILS GUMMIES PO) Take 1 tablet by mouth daily.     Calcium Carb-Ergocalciferol (CHEWABLE CALCIUM/D PO) Take 1 each by mouth daily.     clidinium-chlordiazePOXIDE (LIBRAX) 5-2.5 MG capsule Take 1 capsule by mouth daily as needed. (Patient not taking: Reported on 10/02/2023) 60 capsule 3   digoxin (LANOXIN) 0.125 MG tablet TAKE 1/2 TABLET BY MOUTH DAILY 45 tablet 3   diltiazem (CARDIZEM CD) 120 MG 24 hr capsule TAKE 1 CAPSULE BY MOUTH DAILY 90 capsule 3   Doxylamine-Phenylephrine-APAP (SINUS & CONGESTION DAY/NIGHT) MISC 1 tablet Orally once a day As needed     fluticasone (FLONASE) 50 MCG/ACT nasal spray Place 1 spray into both nostrils daily as needed for allergies or rhinitis. (Patient not taking: Reported on 10/02/2023)     metoprolol succinate (TOPROL-XL) 50 MG 24 hr tablet Take 2 tablets (100 mg) in the morning.  Take  1 &1/2 tablets (75mg ) in the evening. Take with or immediately following a  meal. 315 tablet 3   mirtazapine (REMERON) 15 MG tablet Take 15 mg by mouth at bedtime. (Patient not taking: Reported on 10/02/2023)      nitrofurantoin, macrocrystal-monohydrate, (MACROBID) 100 MG capsule Take 100 mg by mouth 2 (two) times daily. (Patient not taking: Reported on 10/02/2023)     Phenylephrine-APAP-guaiFENesin (EQ SINUS CONGESTION & PAIN PO) Take 1 tablet by mouth daily as needed (for sinus pain). Contains acetaminophen 325 mg and Phenylephrine 5 mg (Patient not taking: Reported on 10/02/2023)     polyethylene glycol powder (GLYCOLAX/MIRALAX) 17 GM/SCOOP powder Take 17 g by mouth daily as needed for moderate constipation. (Patient not taking: Reported on 10/02/2023)     potassium chloride (KLOR-CON M) 10 MEQ tablet TAKE 1 TABLET BY MOUTH DAILY (Patient not taking: Reported on 10/02/2023) 90 tablet 3   Tiotropium Bromide Monohydrate (SPIRIVA RESPIMAT) 1.25 MCG/ACT AERS Inhale 2 puffs into the lungs daily. (Patient not taking: Reported on 10/02/2023) 4 g 5   torsemide (DEMADEX) 20 MG tablet Take 2 tablets (40 mg total) by mouth daily. Take 1 Tablet in the Morning and 1 Tablet in the evening (Patient taking differently: Take 10 mg by mouth daily. Take 1 Tablet in the Morning and 1 Tablet in the evening) 180 tablet 3   zolpidem (AMBIEN) 5 MG tablet Take 2.5 mg by mouth at bedtime.     No current facility-administered medications for this visit.    Socially she is divorced has 4 children 9 grandchildren. She does exercise. No tobacco use. She does occasional wine.  ROS General: Negative; No fevers, chills, or night sweats;  HEENT: Negative; No changes in vision or hearing, sinus congestion, difficulty swallowing Pulmonary:  unprovoked PE January 2023, bronchiectasis Cardiovascular: See HPI GI: Negative; No nausea, vomiting, diarrhea, or abdominal pain GU: Negative; No dysuria, hematuria, or difficulty voiding Musculoskeletal: Positive for significant scoliosis; fibromyalgia;  joint pain, or weakness Hematologic/Oncology: Negative; no easy bruising, bleeding Endocrine: Negative; no heat/cold intolerance; no diabetes Neuro:  History of migraine headaches Skin: Positive for fungal infection of her toenail Psychiatric: Negative; No behavioral problems, depression Sleep: Positive for sleep apnea ; No snoring, daytime sleepiness, hypersomnolence, bruxism, restless legs, hypnogognic hallucinations, no cataplexy Other comprehensive 14 point system review is negative.  PE BP 110/80 (BP Location: Left Arm, Patient Position: Sitting, Cuff Size: Normal)   Pulse (!) 121   Ht 5' (1.524 m)   Wt 108 lb (49 kg)   SpO2 94%   BMI 21.09 kg/m    Repeat blood pressure by me was 118/70  Wt Readings from Last 3 Encounters:  11/10/23 108 lb (49 kg)  10/02/23 105 lb 9.6 oz (47.9 kg)  09/09/23 110 lb (49.9 kg)    General: Alert, oriented, no distress.  Skin: normal turgor, no rashes, warm and dry HEENT: Normocephalic, atraumatic. Pupils equal round and reactive to light; sclera anicteric; extraocular muscles intact;  Nose without nasal septal hypertrophy Mouth/Parynx benign; Mallinpatti scale 3 Neck: No JVD, no carotid bruits; normal carotid upstroke Lungs: clear to ausculatation and percussion; no wheezing or rales Chest wall: without tenderness to palpitation Heart: PMI not displaced, irregular irregular rhythm with ventricular rate around 90., s1 s2 normal, 1/6 systolic murmur, no diastolic murmur, no rubs, gallops, thrills, or heaves Abdomen: soft, nontender; no hepatosplenomehaly, BS+; abdominal aorta nontender and not dilated by palpation. Back: Kyphoscoliosis Pulses 2+ Musculoskeletal: full range of motion, normal strength, no joint deformities Extremities: no clubbing cyanosis or edema, Homan's sign negative  Neurologic: grossly nonfocal; Cranial  nerves grossly wnl Psychologic: Normal mood and affect  EKG Interpretation Date/Time:  Tuesday November 10 2023 11:43:52 EST Ventricular Rate:  121 PR Interval:    QRS Duration:  82 QT Interval:  314 QTC Calculation: 445 R Axis:   -76  Text  Interpretation: Undetermined rhythm Low voltage QRS Left anterior fascicular block Septal infarct (cited on or before 04-Feb-2022) When compared with ECG of 19-Jun-2023 09:25, Current undetermined rhythm precludes rhythm comparison, needs review Nonspecific T wave abnormality now evident in Lateral leads Confirmed by Nicki Guadalajara ([id number]) on 11/10/2023 12:06:26 PM    June 19, 2023 ECG (independently read by me): Atrial fibrillation at 90, LAHB  December 22, 2022  ECG (independently read by me):  Atrial fibrillation  at 101, LAHB, QS V1-3  September 15, 2022 ECG (independently read by me): Atrial fibrillation at 76, right axis; PRWP  April 30, 2022 ECG (independently read by me): Atrial fibrillation at 86, LAHB, PRWP   March 03, 2022 ECG (independently read by me): Atrial fibrillation at 97, QS V1-3  Jan 29, 2022 ECG (independently read by me): Probable A fib with coarse fibrilatory waves at 97 bpm vs A flutter with variable block; QTc 441  December 19, 2021 ECG (independently read by me):  Atrial fibrillation at 94, LAHB    October 10, 2021 ECG (independently read by me): Sinus bradycardia at 59,   Feb 13, 2021 ECG (independently read by me): Sinus bradycardia at 59 with mild arhythmia  October 2021 ECG (independently read by me): Sinus rhythm at 67 with mild arrythmia; norma intervals  April 2021 ECG (independently read by me): Normal sinus rhythm at 69 bpm. No ectopy. Normal intervals.  October 18, 2019 ECG (independently read by me): Sinus bradycardia 59 bpm.  QS complex V1 V2.  Normal intervals.  No ectopy  October 2020 ECG (independently read by me): Normal sinus rhythm at 63 bpm.  QS complex V1 V2.  No ectopy.  Normal intervals.  June 2020 ECG (independently read by me): NSR at 62; no ectopy; normal intervals   November 2019 ECG (independently read by me): Normal sinus rhythm with PAC.  Ventricular rate 66.  QS V1 V2.  Mild T wave abnormality  June 2019 ECG (independently read by  me): Normal sinus rhythm at 64 bpm.  Normal intervals.  No ectopy.  September 2018 ECG (independently read by me): Normal sinus rhythm at 64 bpm.  Isolated PAC.  QTc interval 414 ms.  QS V1 V2, unchanged.  May 2018 ECG (independently read by me): Normal sinus rhythm at 65 bpm.  QS V1, V2.  Normal intervals.  No ST segment changes.  March 2018 ECG (independently read by me): Normal sinus rhythm at 65 bpm.  No ectopy.  Normal intervals.  September 2017 ECG (independently read by me): Normal sinus rhythm at 62 bpm.  QS V1 and V2.  Normal intervals.  April 2017 ECG (independently read by me): Normal sinus rhythm at 70 bpm.  No ectopy.  PR interval 158 ms and QTc interval 414 ms.  March 2017 ECG (independently read by me): normal sinus rhythm at 61 bpm..  No ST segment changes.  Normal intervals.  QTc interval 396 ms.  August 2014 ECG (independently read by me): Normal sinus rhythm at 63 bpm.  QRS couplets V1 V2.  No cigarette ST-T changes.  May 2016 ECG (independently read by me): Sinus bradycardia 59 bpm.  No ectopy.  February 2016 ECG (independently read by me): Sinus bradycardia 56 bpm.  Normal intervals.  Prior ECG (independently read by me): Normal sinus rhythm at 62 beats per minute.  QTc interval 411 milliseconds.  QRS complex V1, V2.  Normal intervals.  ECG (independently read by me): Normal sinus rhythm at 62 beats per minute. Normal intervals. QTc interval 399 ms.  Prior ECG of 07/13/2013: Sinus rhythm at 61 beats per minute. QTc interval 406 ms. PR interval normal at 168 ms.  LABS:    Latest Ref Rng & Units 09/09/2023   12:17 PM 03/30/2023   12:02 PM 03/16/2023    1:00 PM  BMP  Glucose 70 - 99 mg/dL 161  92  93   BUN 6 - 23 mg/dL 24  21  18    Creatinine 0.40 - 1.20 mg/dL 0.96  0.45  4.09   BUN/Creat Ratio 12 - 28  17  16    Sodium 135 - 145 mEq/L 132  138  138   Potassium 3.5 - 5.1 mEq/L 4.1  4.7  3.8   Chloride 96 - 112 mEq/L 94  97  93   CO2 19 - 32 mEq/L 28  29  25     Calcium 8.4 - 10.5 mg/dL 9.1  9.5  9.4       Latest Ref Rng & Units 09/15/2022    4:04 PM 05/14/2022   10:35 AM 01/29/2022    4:06 PM  Hepatic Function  Total Protein 6.0 - 8.5 g/dL 7.3  7.2  7.6   Albumin 3.7 - 4.7 g/dL 4.2  4.5  4.6   AST 0 - 40 IU/L 25  30  28    ALT 0 - 32 IU/L 15  21  24    Alk Phosphatase 44 - 121 IU/L 101  82  69   Total Bilirubin 0.0 - 1.2 mg/dL 1.1  0.9  0.5       Latest Ref Rng & Units 09/09/2023   12:17 PM 03/02/2023   11:41 AM 09/15/2022    4:04 PM  CBC  WBC 4.0 - 10.5 K/uL 8.4  6.1  7.7   Hemoglobin 12.0 - 15.0 g/dL 81.1  91.4  78.2   Hematocrit 36.0 - 46.0 % 43.6  44.1  43.3   Platelets 150.0 - 400.0 K/uL 272.0  233.0  269    Lab Results  Component Value Date   TSH 1.220 09/15/2022  Lipid Panel     Component Value Date/Time   CHOL 154 12/03/2016 0845   TRIG 81 12/03/2016 0845   HDL 67 12/03/2016 0845   CHOLHDL 2.3 12/03/2016 0845   VLDL 16 12/03/2016 0845   LDLCALC 71 12/03/2016 0845     RADIOLOGY:  CT CHEST 10/09/2021 IMPRESSION: 1. 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 Chronic aspiration could have a similar appearance. 2. Part solid nodule of the right lower lobe measuring 7.5 mm in mean diameter with 4 mm solid component. Follow-up non-contrast CT recommended at 3-6 months to confirm persistence. If unchanged, and solid component remains <6 mm, annual CT is recommended until 5 years of stability has been established. If persistent these nodules should be considered highly suspicious if the solid component of the nodule is 6 mm or greater in size and enlarging. This recommendation follows the consensus statement: Guidelines for Management of Incidental Pulmonary Nodules Detected on CT Images: From the Fleischner Society 2017;  Radiology 2017; 161:096-045. 3.  Aortic Atherosclerosis (ICD10-I70.0).     ECHO: 12/03/2021  1. Left ventricular ejection fraction, by estimation, is 60 to 65%. Left  ventricular ejection fraction by 2D MOD biplane is 60.8 %. The left  ventricle has normal function. The left ventricle has no regional wall  motion abnormalities. There is mild left  ventricular hypertrophy. Left ventricular diastolic function could not be  evaluated.   2. Left atrial size was moderately dilated.   3. Right atrial size was moderately dilated.   4. The pericardial effusion is circumferential. There is no evidence of  cardiac tamponade.   5. The mitral valve is myxomatous. Moderate mitral valve regurgitation.  There is moderate late systolic prolapse of the middle scallop of the  posterior leaflet of the mitral valve.   6. Tricuspid valve regurgitation is moderate.   7. The aortic valve is tricuspid. Aortic valve sclerosis is present, with  no evidence of aortic valve stenosis.   8. There is normal pulmonary artery systolic pressure. The estimated  right ventricular systolic pressure is 34.4 mmHg.   9. The inferior vena cava is dilated in size with >50% respiratory  variability, suggesting right atrial pressure of 8 mmHg.   Comparison(s): Changes from prior study are noted. 10/15/2021: LVEF 63%,  moderate to severe MR with posterior leaflet prolapse, grade 2 DD.    ECHO: 04/07/2023 IMPRESSIONS   1. Left ventricular ejection fraction, by estimation, is 55 to 60%. The  left ventricle has normal function. The left ventricle has no regional  wall motion abnormalities. Left ventricular diastolic function could not  be evaluated.   2. Right ventricular systolic function is normal. The right ventricular  size is normal. There is moderately elevated pulmonary artery systolic  pressure. The estimated right ventricular systolic pressure is 51.0 mmHg.   3. Left atrial size was severely dilated.   4. Right atrial  size was severely dilated.   5. The mitral valve is myxomatous. Moderate to severe mitral valve  regurgitation. No evidence of mitral stenosis. There is moderate  holosystolic prolapse of the middle scallop of the posterior leaflet of  the mitral valve.   6. Tricuspid valve regurgitation is mild to moderate.   7. The aortic valve is tricuspid. Aortic valve regurgitation is not  visualized. Aortic valve sclerosis/calcification is present, without any  evidence of aortic stenosis.   8. Pulmonic valve regurgitation is moderate.   9. The inferior vena cava is normal in size with <50% respiratory  variability, suggesting right atrial pressure of 8 mmHg.  10. Consider TEE if clinically indicated and if patient is a candidate for  mitraclip.    IMPRESSION:  1. Persistent atrial fibrillation (HCC)     ASSESSMENT AND PLAN: Janet Nguyen is an 89 year old female who has a remote history of SVT and PACs/PVCs which were controlled with beta blocker therapy. An echo in March 2014 showed normal systolic function with grade 1 diastolic dysfunction and had significant left atrial dilatation and mild dilatation of the right ventricle with moderate dilatation of the right atrium. She had mild/moderate pulmonary hypertension with PA pressures of 42 mm. An echo in October 2018 showed an EF of 55 to 60% with grade 1 diastolic dysfunction, mitral annular calcification with mild MR, and mild pulmonary hypertension with PA pressure 34 mm. Her left atrium was mild to moderately dilated.  She had venous reflux on lower extremity reflux evaluation which did not show any evidence of DVT in October  2021.  A chest CT in January 2023 was done for increasing shortness of breath and revealed probable reactive mediastinal lymph nodes, scattered focal areas of bronchiectasis and clustered nodules in the lower lobes as well as a solitary nodule in the right lower lobe at 7.5 mm with a 4 mm solid component.  She subsequently was  found to have pulmonary embolism on January 23 and was started on Eliquis anticoagulation in addition to Spiriva for her shortness of breath by Dr. Marchelle Gearing.  She was hospitalized with atrial fibrillation with RVR on December 02, 2021.  Her echo Doppler study at that time showed an EF of 60 to 65% with mild LVH there was moderate biatrial enlargement, small circumferential pericardial effusion and she had moderate late systolic prolapse of the middle scallop of the posterior leaflet of the mitral valve resulting in moderate mitral regurgitation.  There is aortic sclerosis.  When I saw her in March 2023 she continued to be in atrial fibrillation at that time metoprolol succinate was further titrated to 100 mg daily but I recommended she take 50 mg twice a day for more blood pressure stability.  She underwent successful cardioversion on January 08, 2022 performed by Dr. Shari Prows.  Subsequently, she has been in  persistent atrial fibrillation on subsequent evaluation at which time her diltiazem dose was increased to 180 mg and she was referred to atrial fibrillation clinic.  She is not a candidate for amiodarone due to her lung disease.  It was advised that she consider admission for Tikosyn initiation at low-dose but she canceled the admission.  When I saw her when I saw her in June 2023 we had a lengthy discussion and she had reduced her dose of diltiazem down to 120 mg.  She did not feel confident about pursuing Tikosyn and her decision was to attempt rate control.  I added digoxin 0.0625 mg for additional rate control.  A subsequent CT scan was done and she has seen Dr. Marchelle Gearing.  She has bronchiectasis.  Her previous lung nodule had improved.  She has had some issues with lower extremity edema leading to discontinuance of furosemide with transition to torsemide.  Presently, she continues to be in longstanding persistent atrial fibrillation with current ventricular rate in the 90s.  She is on low-dose anticoagulation  with Eliquis 2.5 mg twice a day.  Her rate is controlled with diltiazem CD120 daily in addition to metoprolol succinate 100 mg in the morning and 75 mg at night.  She has been taking torsemide 20 mg twice a day with improvement in her lower extremity edema.  Her most recent echo Doppler study from April 07, 2023 showed EF at 55 to 60%.  She had moderate right ventricular pressure elevation 51 mm.  She had severe biatrial enlargement.  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  There was mild to moderate TR and moderate PR.  With her age and comorbidities, I do not feel she is a candidate for consideration for mitral valve clip.  Clinically she is relatively stable.  Blood pressure stable on current regimen.  She does have venous stasis changes without significant edema presently.  I have recommended she have a follow-up evaluation with Whitney Muse board, NP in January 2025 and I will see her in follow-up in April for reassessment.    Lennette Bihari, MD, [facility]  11/10/2023 12:05 PM

## 2023-11-12 ENCOUNTER — Telehealth: Payer: Self-pay | Admitting: Cardiovascular Disease

## 2023-11-12 ENCOUNTER — Encounter: Payer: Self-pay | Admitting: Cardiovascular Disease

## 2023-11-12 NOTE — Telephone Encounter (Signed)
RN spoke to Dr. Tresa Endo  Dr. Tresa Endo states he would like patient to take Metoprolol S 100mg  BID due to tachy cardia    ____________________________________________________________  Attempted to call patient, no answer left message requesting a call back.

## 2023-11-12 NOTE — Telephone Encounter (Signed)
Pt called in to about her Metoprolol. She states the pharmacy told her its a different type of metoprolol and she asked why a different one was sent. Please advise.

## 2023-11-13 ENCOUNTER — Ambulatory Visit: Payer: Medicare Other | Admitting: Physical Therapy

## 2023-11-13 ENCOUNTER — Encounter: Payer: Self-pay | Admitting: Physical Therapy

## 2023-11-13 DIAGNOSIS — M545 Low back pain, unspecified: Secondary | ICD-10-CM

## 2023-11-13 DIAGNOSIS — M542 Cervicalgia: Secondary | ICD-10-CM | POA: Diagnosis not present

## 2023-11-13 DIAGNOSIS — R293 Abnormal posture: Secondary | ICD-10-CM

## 2023-11-13 DIAGNOSIS — M546 Pain in thoracic spine: Secondary | ICD-10-CM

## 2023-11-13 DIAGNOSIS — M4125 Other idiopathic scoliosis, thoracolumbar region: Secondary | ICD-10-CM

## 2023-11-13 DIAGNOSIS — M62830 Muscle spasm of back: Secondary | ICD-10-CM

## 2023-11-13 DIAGNOSIS — G8929 Other chronic pain: Secondary | ICD-10-CM | POA: Diagnosis not present

## 2023-11-13 DIAGNOSIS — M6281 Muscle weakness (generalized): Secondary | ICD-10-CM

## 2023-11-13 NOTE — Telephone Encounter (Signed)
Spoke to patient she wanted to verify Metoprolol dose.Stated at last visit with [facility] he increased Metoprolol Succinate to 50 mg 2 tablets twice a day.Stated she went to pick up prescription and pharmacist told her it was a different type.After reviewing chart Metoprolol Succinate 50 mg take 2 tablets twice a day was sent in correctly. Stated she will be bringing a spread sheet of all her medications for Dr.Kelly to review.I will make Dr.Kelly aware.

## 2023-11-13 NOTE — Therapy (Signed)
OUTPATIENT PHYSICAL THERAPY THORACOLUMBAR EVALUATION   Patient Name: Janet Nguyen MRN: [id number] DOB:Oct 30, 1931, 89 y.o., female Today's Date: 11/13/2023  END OF SESSION:  PT End of Session - 11/13/23 1149     Visit Number 2    Date for PT Re-Evaluation 01/01/24    Authorization Type Medicare part A/B - KX at visit 15    Progress Note Due on Visit 10    PT Start Time 1100    PT Stop Time 1149    PT Time Calculation (min) 49 min    Activity Tolerance Patient tolerated treatment well    Behavior During Therapy [facility] for tasks assessed/performed              Past Medical History:  Diagnosis Date   Arrhythmia    History of SVT with documented PVC'S and  PAC'S  12/08/12 Nuc stress test normal LV EF 74%  Event Monitor  12/01/12-01/03/13   Atrial flutter (HCC)    Celiac disease    treated by Dr. Kinnie Scales   GERD (gastroesophageal reflux disease)    Hypertension    Intervertebral disc stenosis of neural canal of cervical region    Irregular heart beat 11/30/2012   ECHO-EF 60-65%   Osteoporosis    PMR (polymyalgia rheumatica) (HCC)    Dr. Mallie Mussel; pt states she was diagnosed 10-15 years ago, not treated at this time or any issues that she is aware of.   Scoliosis    Scoliosis    Sleep apnea 10/02/11 [facility]   Sleep study AHI -total sleep 10.3/hr  64.0/ hr during REM sleep.RDI 22.8/hr during total sleep 64.0/hr during REM sleep The lowest O2 sat during Non-REM and REM sleep was 86% and 88% respectively. 04/08/12 CPAP/BIPAP titration study [facility]   Past Surgical History:  Procedure Laterality Date   ANAL RECTAL MANOMETRY N/A 09/16/2023   Procedure: ANO RECTAL MANOMETRY;  Surgeon: Willis Modena, MD;  Location: [facility];  Service: Gastroenterology;  Laterality: N/A;   APPENDECTOMY     ruptured at age 37 and had surgery   CARDIAC CATHETERIZATION  01/27/06   CARDIOVERSION N/A 01/08/2022   Procedure: CARDIOVERSION;  Surgeon:  Meriam Sprague, MD;  Location: [facility];  Service: Cardiovascular;  Laterality: N/A;   cataract surgery  2015   Dr. Elmer Picker; March & April 2015   Patient Active Problem List   Diagnosis Date Noted   Dyspnea 05/30/2023   Pleural effusion 05/30/2023   Radon exposure 05/30/2023   Secondary hypercoagulable state (HCC) 12/12/2021   Hyponatremia 12/04/2021   Acute CHF (congestive heart failure) (HCC) 12/02/2021   Chronic diastolic heart failure (HCC) 12/02/2021   Atrial fibrillation with rapid ventricular response (HCC) 12/02/2021   Essential hypertension 12/02/2021   GERD without esophagitis 12/02/2021   Obstructive sleep apnea 12/02/2021   Interstitial lung disease (HCC) 12/02/2021   History of pulmonary embolism 12/02/2021   Pulmonary embolism (HCC) 11/06/2021   Bronchiectasis (HCC) 11/06/2021   Deviated septum 05/08/2020   Mass of subcutaneous tissue 05/02/2020   Vertigo 06/16/2019   Pulmonary hypertension, unspecified (HCC) 04/14/2019   Ischemic colitis (HCC) 04/14/2019   Migraine with aura and without status migrainosus, not intractable 11/08/2018   Degeneration of lumbar intervertebral disc 10/14/2018   Hoarseness of voice 03/04/2018   Abdominal pain 07/01/2017   Diverticulitis, colon    Metabolic acidosis, increased anion gap    Constipation 04/14/2017   Lumbar hernia 04/14/2017   Presbycusis of both ears 01/10/2017  Tinnitus aurium, bilateral 01/10/2017   Gastroesophageal reflux disease 08/20/2016   Hemoptysis 08/20/2016   Obstructive sleep apnea of adult 08/20/2016   Rhinitis, chronic 08/20/2016   Throat pain in adult 08/20/2016   Fatigue 12/23/2015   Sciatica of right side 10/06/2015   History of migraine headaches 10/06/2015   Frequent PVCs 12/28/2013   Premature atrial contractions 12/28/2013   PSVT (paroxysmal supraventricular tachycardia) (HCC) 12/28/2013   Heart palpitations 07/13/2013   Sleep apnea 04/11/2013   Scoliosis 04/11/2013   Atypical  atrial flutter (HCC) 11/29/2012   Chest pain, atypical 11/29/2012   Fibromyalgia syndrome 11/29/2012   Chronic steroid use 11/29/2012    PCP: [name], MD  REFERRING PROVIDER: [name], MD  REFERRING DIAG: M41.9 (ICD-10-CM) - Scoliosis  Rationale for Evaluation and Treatment: Rehabilitation  THERAPY DIAG:  Abnormal posture  Other idiopathic scoliosis, thoracolumbar region  Cervicalgia  Pain in thoracic spine  Chronic bilateral low back pain without sciatica  Muscle weakness (generalized)  Muscle spasm of back  ONSET DATE: chronic, years of progressive pain from degenerative scoliosis  SUBJECTIVE:                                                                                                                                                                                           SUBJECTIVE STATEMENT: I know I am going to feel better once we work on me a few more times.  I meant to bring my pelvic floor exercises to go over but I forgot them.    PERTINENT HISTORY:  Degenerative scoliosis, complex medical history including pulmonary HTN, frequent PVCs and a-flutter, history of cardioversion, osteoporosis Has been SOB with activity On waitlist to get into cardiologist sooner   PAIN:  PAIN:  Are you having pain? Yes NPRS scale: 7/10 Pain location: abdomen, left side of trunk, low back, intermittent bil hips/buttock/knee, Rt foot pains, neck Pain orientation: Other: see above   PAIN TYPE: aching, sharp, and tight Pain description: intermittent and constant  Aggravating factors: varies Relieving factors: unsure   PRECAUTIONS: Other: osteoporosis  RED FLAGS: None   WEIGHT BEARING RESTRICTIONS: No  FALLS:  Has patient fallen in last 6 months? No  LIVING ENVIRONMENT: Lives with: lives with their family and lives alone Lives in: House/apartment Stairs: No Has following equipment at home: has cane and walker but does not use  OCCUPATION:  retired  PLOF: Independent with basic ADLs, Independent with household mobility without device, Independent with community mobility without device, and Needs assistance with homemaking  PATIENT GOALS:  be able to eat, improve strength for bowel control, feel less pain and more energy  to expand social outlets and walking  NEXT MD VISIT: as needed  OBJECTIVE:  Note: Objective measures were completed at Evaluation unless otherwise noted.   The Patient-Specific Functional Scale  Initial:  I am going to ask you to identify up to 3 important activities that you are unable to do or are having difficulty with as a result of this problem.  Today are there any activities that you are unable to do or having difficulty with because of this?  (Patient shown scale and patient rated each activity)  Follow up: When you first came in you had difficulty performing these activities.  Today do you still have difficulty?    DIAGNOSTIC FINDINGS:    PATIENT SURVEYS:  PSEQ: 33/60 (<30 = low self-efficacy) Pain Catastrophizing Scale: 17 (>30 = clinically signif level of pain catastrophizing)  COGNITION: Overall cognitive status:  intact but high anxiety and depression      SENSATION: WFL  MUSCLE LENGTH: Adaptive shortening of Lt obliques, QL, lat secondary to rigid scoliosis  POSTURE: rounded shoulders, forward head, decreased lumbar lordosis, and increased thoracic kyphosis, severe degenerative scoliosis with Lt SB/Rt Rot, left ribcage in near contact with left ilium  PALPATION: Spasm present in bil upper traps Fascial and muscle mobility restriction along Lt side of trunk due to adaptive shortening from scoliosis: obliques, QL, hip flexors, lat  LUMBAR ROM:   AROM eval  Flexion full  Extension NT  Right lateral flexion 20%  Left lateral flexion 40%  Right rotation 40%  Left rotation 20%   (Blank rows = not tested)  LOWER EXTREMITY ROM:    WFL, flexion is full with good hamstring  length   LOWER EXTREMITY MMT:   Grossly 4/5 throughout, 4-/5 hip abduction, ER  FUNCTIONAL TESTS:    GAIT: Distance walked: within clinic Assistive device utilized: None Level of assistance: Modified independence Comments: short stride length  TREATMENT DATE:  11/13/23: Rt SL manual therapy - broadening Lt obliques, QL, intercostals, paraspinals from cervical to lumbar, gluteals, hip flexors.  Gentle sacral distraction. Supine therex: SKTC alt LE x10, DKTC with rocking x30", hamstring stretch 2x30", fig 4 stretch 2x30" bil, abdominal stabilization with LE bicycle x20" and scissors at 90 deg hip flexion x 20" Seated in chair posterior bil upper quadrant stripping, passive pec stretch bil   11/06/23         Pt education about how depression can affect motivation, energy level, QOL, social isolation, and pain experience Discussed journaling and listing activities she knows she "can" do but isn't doing and to choose one to try  5 min rule from Cognitive Behavioral Therapy - try something for 5 min and you can stop then if you want to Discussed her mental block to getting a walker despite the health benefits it would allow her Discussed her bowel movement behaviors and diet - very little intake and inconsistent intake of protein  PATIENT EDUCATION:  Education details: Pt education (see above) Person educated: Patient Education method: Explanation Education comprehension: verbalized understanding  HOME EXERCISE PROGRAM: Journaling to outline some activities to try  ASSESSMENT:  CLINICAL IMPRESSION: Patient benefits greatly from manual therapy to address adaptive shortening of soft tissues and rigid curvature related to degenerative scoliosis.  She has multiple factors contributing toward her pain experience including physical, mental, social and emotional.  She is unable to  eat much secondary to pain and compression of abdomen and its contents secondary to degenerative scoliosis which is also negatively affecting her bowel routine.  She is frequently SOB secondary to compression of thoracic cavity.  She reports that all of her systems (respiratory, digestive, musculoskeletal) seem to function more comfortably and efficiently with manual therapy.  She will continue benefit from manual therapy, endurance training, and self-care strategies to reduce variables contributing to her QOL.  OBJECTIVE IMPAIRMENTS: cardiopulmonary status limiting activity, decreased activity tolerance, decreased coordination, decreased endurance, decreased mobility, decreased ROM, decreased strength, hypomobility, increased fascial restrictions, increased muscle spasms, impaired flexibility, impaired tone, improper body mechanics, postural dysfunction, and pain.   ACTIVITY LIMITATIONS: carrying, lifting, bending, sitting, standing, squatting, sleeping, stairs, transfers, bed mobility, continence, bathing, dressing, and locomotion level  PARTICIPATION LIMITATIONS: meal prep, cleaning, laundry, shopping, and community activity  PERSONAL FACTORS: Age, Behavior pattern, Time since onset of injury/illness/exacerbation, and 1-2 comorbidities: pulmonary HTN, a-flutter, osteoporosis, severe degenerative scoliosis  are also affecting patient's functional outcome.   REHAB POTENTIAL: Good  CLINICAL DECISION MAKING: Evolving/moderate complexity  EVALUATION COMPLEXITY: Moderate   GOALS: Goals reviewed with patient? Yes  SHORT TERM GOALS: Target date: 11/27/23  Pt will journal and discover at least 1 activity to return to that she enjoys Baseline: Goal status: INITIAL  2.  Pt will strive to eat consistent small meals throughout the day to initiate a bowel routine Baseline:  Goal status: INITIAL  3.  Pt will report 20% less pain around abdomen and trunk to allow for comfort with small  meals Baseline:  Goal status: INITIAL    LONG TERM GOALS: Target date: 01/01/24  Pt will return to walking program at least 2x/week for multi-system health benefits. Baseline:  Goal status: INITIAL  2.  Pt will be ind with HEP for gluteal and pelvic floor strength to reduce incidence of bowel leakage. Baseline:  Goal status: INITIAL  3.  Pt will improve score of PSEQ to at least 40 to demo improved self-efficacy within chronic pain. Baseline: 33 Goal status: INITIAL  4.  Pt will establish nighttime routine including Curable app, medidation, breathing and stretching routine to improve sleep stretches. Baseline:  Goal status: INITIAL  5.  Pt will report at least 50% less pain with daily tasks and eating secondary to reduced soft tissue compression surrounding scoliosis. Baseline:  Goal status: INITIAL    PLAN:  PT FREQUENCY: 1-2x/week  PT DURATION: 8 weeks  PLANNED INTERVENTIONS: 97110-Therapeutic exercises, 97530- Therapeutic activity, [id number]- Neuromuscular re-education, 97535- Self Care, 86578- Manual therapy, Dry Needling, Joint mobilization, Spinal mobilization, Cryotherapy, and Moist heat.  PLAN FOR NEXT SESSION: review pelvic floor therex from past PT, review journal if Pt brought, ambulation for endurance, manual therapy   Rontae Inglett, PT 11/13/23 11:57 AM

## 2023-11-13 NOTE — Telephone Encounter (Signed)
Pt returning call

## 2023-11-17 NOTE — Telephone Encounter (Signed)
Pt requesting cb to discuss if spreadsheet was reviewed and to discuss meds.

## 2023-11-19 ENCOUNTER — Telehealth (HOSPITAL_BASED_OUTPATIENT_CLINIC_OR_DEPARTMENT_OTHER): Payer: Self-pay | Admitting: Pulmonary Disease

## 2023-11-19 ENCOUNTER — Telehealth: Payer: Self-pay

## 2023-11-19 ENCOUNTER — Other Ambulatory Visit (HOSPITAL_COMMUNITY): Payer: Self-pay

## 2023-11-19 ENCOUNTER — Telehealth: Payer: Self-pay | Admitting: Cardiovascular Disease

## 2023-11-19 MED ORDER — APIXABAN 2.5 MG PO TABS: 2.5000 mg | ORAL_TABLET | Freq: Two times a day (BID) | ORAL | Status: DC

## 2023-11-19 NOTE — Addendum Note (Signed)
Addended by: Freddi Starr on: 11/19/2023 02:02 PM   Modules accepted: Orders

## 2023-11-19 NOTE — Telephone Encounter (Signed)
*  STAT* If patient is at the pharmacy, call can be transferred to refill team.   1. Which medications need to be refilled? (please list name of each medication and dose if known) apixaban (ELIQUIS) 2.5 MG TABS tablet   2. Which pharmacy/location (including street and city if local pharmacy) is medication to be sent to?  3. Do they need a 30 day or 90 day supply? Pt requesting samples, running low

## 2023-11-19 NOTE — Telephone Encounter (Signed)
Pharmacy Patient Advocate Encounter   Received notification from Physician's Office that prior authorization for ELIQUIS is required/requested.   Insurance verification completed.   The patient is insured through  [facility]  .   Per test claim: The current 30 day co-pay is, $542.02 (PLAN HAS DEDUCTIBLE OVER $500).  No PA needed at this time. This test claim was processed through [facility]- copay amounts may vary at other pharmacies due to pharmacy/plan contracts, or as the patient moves through the different stages of their insurance plan.

## 2023-11-19 NOTE — Telephone Encounter (Signed)
I discussed with patient risks and benefits of Eliquis for her co-morbidities (eg atrial fibrillation and hx PE). Medically would recommend continuing this. Patient is considering stopping due to age, side effects and cost. We discussed risk of stroke and recurrent VTE/PE as well as death.  We discussed the costs of medications and how she needs to meet her deductible before certain medications will have improved costs.  She appreciated the call and will consider picking up the Eliquis samples from her Cardiologist.  Closing encounter.

## 2023-11-19 NOTE — Telephone Encounter (Signed)
Spoke with the doctor regarding the list of medications patient left for doctor review. Dr. Tresa Endo reviewed the medication list and last office encounter note, says patient list looks to be accurate. Called patient to inform of approval. Left voicemail.

## 2023-11-19 NOTE — Telephone Encounter (Signed)
Patient can have Eliquis 2.5mg  samples. Needs to pay off her deductible. Recommend she contact her plan.

## 2023-11-19 NOTE — Telephone Encounter (Signed)
Spoke with patient she was notified by her Cardiologist that they had samples for her  of Eliquis but she dosen't want to take this if its going to be super expensive for her and whether she really needs it or not or if there is a cheaper option for her.She states you and her had a discussion about it at her last visit and she wanted to run it by you also [phone]

## 2023-11-19 NOTE — Telephone Encounter (Signed)
Spoke with pt, aware samples are at the front desk for pick up. Aware she will need to call her medication plan to discuss the amount of her medications as we can not supply her with samples, she needs to meet her deductible. Patient voiced understanding

## 2023-11-20 ENCOUNTER — Encounter: Payer: Medicare Other | Admitting: Physical Therapy

## 2023-11-26 ENCOUNTER — Ambulatory Visit: Payer: Self-pay | Admitting: Pulmonary Disease

## 2023-11-26 NOTE — Telephone Encounter (Signed)
 Chief Complaint: SOB Symptoms: SOB, anxiety Frequency: a week Pertinent Negatives: Patient denies  Disposition: [] ED /[] Urgent Care (no appt availability in office) / [x] Appointment(In office/virtual)/ []  [facility]/ [] Home Care/ [x] Refused Recommended Disposition /[] Janet Nguyen/ []  Follow-up with PCP Additional Notes: Pt c/o of worsening SOB. Reports that she is unsure if medications she is taking is affecting sx. Triage was incomplete d/t caller not wanting to answer questions. Based on limited information, triager attempted to schedule acute visit with pulm, but pt declined d/t wanting to see primary pulmonologist. Triager strongly reinforced use of medications as prescribed and to call back with worsening symptoms. Triager will forward encounter for Dr. Everardo All to review and advise as needed. Of note, pt did state she intends to keep appt with Dr. Everardo All in 2 weeks.    Reason for Disposition  [1] Longstanding difficulty breathing (e.g., CHF, COPD, emphysema) AND [2] WORSE than normal  Answer Assessment - Initial Assessment Questions E2C2 Pulmonary Triage "Chief Complaint (e.g., cough, sob, wheezing, fever, chills, sweat or additional symptoms) *Go to specific symptom protocol after initial questions. SOB "How long have symptoms been present?" A week - pt reports taking Eliquis and unsure if worsening sx is d/t medications.   Triage was limited d/t pt not wanting to answer triage questions. Triager tried to reinforced that worsening sx would need an acute visit. Offered acute visit with another provider, declined d/t wanting to stay with primary pulmonologist.  Have you tested for COVID or Flu? Note: If not, ask patient if a home test can be taken. If so, instruct patient to call back for positive results. No  MEDICINES:   "Have you used any OTC meds to help with symptoms?" No If yes, ask "What medications?" *No Answer* "Have you used your  inhalers/maintenance medication?"  If yes, "What medications?" *No Answer* If inhaler, ask "How many puffs and how often?" Note: Review instructions on medication in the chart. *No Answer*  OXYGEN: "Do you wear supplemental oxygen?"  If yes, "How many liters are you supposed to use?" *No Answer* "Do you monitor your oxygen levels?"  If yes, "What is your reading (oxygen level) today?" *No Answer* "What is your usual oxygen saturation reading?"  (Note: Pulmonary O2 sats should be 90% or greater) *No Answer*   1. RESPIRATORY STATUS: "Describe your breathing?" (e.g., wheezing, shortness of breath, unable to speak, severe coughing)      SOB with exertion above baseline 2. ONSET: "When did this breathing problem begin?"      A week 3. PATTERN "Does the difficult breathing come and go, or has it been constant since it started?"      Constant, worsens with anxiety 4. SEVERITY: "How bad is your breathing?" (e.g., mild, moderate, severe)    - MILD: No SOB at rest, mild SOB with walking, speaks normally in sentences, can lie down, no retractions, pulse < 100.    - MODERATE: SOB at rest, SOB with minimal exertion and prefers to sit, cannot lie down flat, speaks in phrases, mild retractions, audible wheezing, pulse 100-120.    - SEVERE: Very SOB at rest, speaks in single words, struggling to breathe, sitting hunched forward, retractions, pulse > 120      Mild-mod 5. RECURRENT SYMPTOM: "Have you had difficulty breathing before?" If Yes, ask: "When was the last time?" and "What happened that time?"      unknown 6. CARDIAC HISTORY: "Do you have any history of heart disease?" (e.g., heart attack, angina, bypass  surgery, angioplasty)      CHF 7. LUNG HISTORY: "Do you have any history of lung disease?"  (e.g., pulmonary embolus, asthma, emphysema)     ILD 8. CAUSE: "What do you think is causing the breathing problem?"      unknown 9. OTHER SYMPTOMS: "Do you have any other symptoms? (e.g., dizziness,  runny nose, cough, chest pain, fever)     *No Answer* 10. O2 SATURATION MONITOR:  "Do you use an oxygen saturation monitor (pulse oximeter) at home?" If Yes, ask: "What is your reading (oxygen level) today?" "What is your usual oxygen saturation reading?" (e.g., 95%)       *No Answer*  Protocols used: Breathing Difficulty-A-AH

## 2023-11-26 NOTE — Telephone Encounter (Signed)
 Tried to call patient but she states she can't spend time on the phone she had just been on the phone with someone for 30 mins and she could not do that again. She was advised by Triage that there were no appts available next week with Dr. Everardo All as requested. She states she will not tell me what is going on she will just keep her appt on March 12 and hope for the best.

## 2023-11-27 ENCOUNTER — Encounter: Payer: Self-pay | Admitting: Physical Therapy

## 2023-11-27 ENCOUNTER — Ambulatory Visit: Payer: Medicare Other | Admitting: Physical Therapy

## 2023-11-27 DIAGNOSIS — M546 Pain in thoracic spine: Secondary | ICD-10-CM | POA: Diagnosis not present

## 2023-11-27 DIAGNOSIS — M542 Cervicalgia: Secondary | ICD-10-CM | POA: Diagnosis not present

## 2023-11-27 DIAGNOSIS — M4125 Other idiopathic scoliosis, thoracolumbar region: Secondary | ICD-10-CM

## 2023-11-27 DIAGNOSIS — R293 Abnormal posture: Secondary | ICD-10-CM

## 2023-11-27 DIAGNOSIS — M545 Low back pain, unspecified: Secondary | ICD-10-CM | POA: Diagnosis not present

## 2023-11-27 DIAGNOSIS — G8929 Other chronic pain: Secondary | ICD-10-CM | POA: Diagnosis not present

## 2023-11-27 DIAGNOSIS — M6281 Muscle weakness (generalized): Secondary | ICD-10-CM

## 2023-11-27 NOTE — Therapy (Signed)
 OUTPATIENT PHYSICAL THERAPY THORACOLUMBAR EVALUATION   Patient Name: Janet Nguyen MRN: [id number] DOB:03/24/1932, 89 y.o., female Today's Date: 11/27/2023  END OF SESSION:  PT End of Session - 11/27/23 1110     Visit Number 3    Date for PT Re-Evaluation 01/01/24    Authorization Type Medicare part A/B - KX at visit 15    Progress Note Due on Visit 10    PT Start Time 1018    PT Stop Time 1103    PT Time Calculation (min) 45 min    Activity Tolerance Patient tolerated treatment well    Behavior During Therapy [facility] for tasks assessed/performed               Past Medical History:  Diagnosis Date   Arrhythmia    History of SVT with documented PVC'S and  PAC'S  12/08/12 Nuc stress test normal LV EF 74%  Event Monitor  12/01/12-01/03/13   Atrial flutter (HCC)    Celiac disease    treated by Dr. Kinnie Scales   GERD (gastroesophageal reflux disease)    Hypertension    Intervertebral disc stenosis of neural canal of cervical region    Irregular heart beat 11/30/2012   ECHO-EF 60-65%   Osteoporosis    PMR (polymyalgia rheumatica) (HCC)    Dr. Mallie Mussel; pt states she was diagnosed 10-15 years ago, not treated at this time or any issues that she is aware of.   Scoliosis    Scoliosis    Sleep apnea 10/02/11 [facility]   Sleep study AHI -total sleep 10.3/hr  64.0/ hr during REM sleep.RDI 22.8/hr during total sleep 64.0/hr during REM sleep The lowest O2 sat during Non-REM and REM sleep was 86% and 88% respectively. 04/08/12 CPAP/BIPAP titration study [facility]   Past Surgical History:  Procedure Laterality Date   ANAL RECTAL MANOMETRY N/A 09/16/2023   Procedure: ANO RECTAL MANOMETRY;  Surgeon: Willis Modena, MD;  Location: [facility];  Service: Gastroenterology;  Laterality: N/A;   APPENDECTOMY     ruptured at age 13 and had surgery   CARDIAC CATHETERIZATION  01/27/06   CARDIOVERSION N/A 01/08/2022   Procedure: CARDIOVERSION;  Surgeon:  Meriam Sprague, MD;  Location: [facility]COPY;  Service: Cardiovascular;  Laterality: N/A;   cataract surgery  2015   Dr. Elmer Picker; March & April 2015   Patient Active Problem List   Diagnosis Date Noted   Dyspnea 05/30/2023   Pleural effusion 05/30/2023   Radon exposure 05/30/2023   Secondary hypercoagulable state (HCC) 12/12/2021   Hyponatremia 12/04/2021   Acute CHF (congestive heart failure) (HCC) 12/02/2021   Chronic diastolic heart failure (HCC) 12/02/2021   Atrial fibrillation with rapid ventricular response (HCC) 12/02/2021   Essential hypertension 12/02/2021   GERD without esophagitis 12/02/2021   Obstructive sleep apnea 12/02/2021   Interstitial lung disease (HCC) 12/02/2021   History of pulmonary embolism 12/02/2021   Pulmonary embolism (HCC) 11/06/2021   Bronchiectasis (HCC) 11/06/2021   Deviated septum 05/08/2020   Mass of subcutaneous tissue 05/02/2020   Vertigo 06/16/2019   Pulmonary hypertension, unspecified (HCC) 04/14/2019   Ischemic colitis (HCC) 04/14/2019   Migraine with aura and without status migrainosus, not intractable 11/08/2018   Degeneration of lumbar intervertebral disc 10/14/2018   Hoarseness of voice 03/04/2018   Abdominal pain 07/01/2017   Diverticulitis, colon    Metabolic acidosis, increased anion gap    Constipation 04/14/2017   Lumbar hernia 04/14/2017   Presbycusis of both ears 01/10/2017  Tinnitus aurium, bilateral 01/10/2017   Gastroesophageal reflux disease 08/20/2016   Hemoptysis 08/20/2016   Obstructive sleep apnea of adult 08/20/2016   Rhinitis, chronic 08/20/2016   Throat pain in adult 08/20/2016   Fatigue 12/23/2015   Sciatica of right side 10/06/2015   History of migraine headaches 10/06/2015   Frequent PVCs 12/28/2013   Premature atrial contractions 12/28/2013   PSVT (paroxysmal supraventricular tachycardia) (HCC) 12/28/2013   Heart palpitations 07/13/2013   Sleep apnea 04/11/2013   Scoliosis 04/11/2013   Atypical  atrial flutter (HCC) 11/29/2012   Chest pain, atypical 11/29/2012   Fibromyalgia syndrome 11/29/2012   Chronic steroid use 11/29/2012    PCP: [name], MD  REFERRING PROVIDER: [name], MD  REFERRING DIAG: M41.9 (ICD-10-CM) - Scoliosis  Rationale for Evaluation and Treatment: Rehabilitation  THERAPY DIAG:  Abnormal posture  Other idiopathic scoliosis, thoracolumbar region  Cervicalgia  Pain in thoracic spine  Chronic bilateral low back pain without sciatica  Muscle weakness (generalized)  ONSET DATE: chronic, years of progressive pain from degenerative scoliosis  SUBJECTIVE:                                                                                                                                                                                           SUBJECTIVE STATEMENT: I need help.  I am irritable b/c I hurt so much.  I can't breathe, I can't eat - everything is so compressed.  I brought the HEP from pelvic floor in the past to ask questions about.  PERTINENT HISTORY:  Degenerative scoliosis, complex medical history including pulmonary HTN, frequent PVCs and a-flutter, history of cardioversion, osteoporosis Has been SOB with activity On waitlist to get into cardiologist sooner   PAIN:  PAIN:  Are you having pain? Yes NPRS scale: 7/10 Pain location: abdomen, left side of trunk, low back, intermittent bil hips/buttock/knee, Rt foot pains, neck Pain orientation: Other: see above   PAIN TYPE: aching, sharp, and tight Pain description: intermittent and constant  Aggravating factors: varies Relieving factors: unsure   PRECAUTIONS: Other: osteoporosis  RED FLAGS: None   WEIGHT BEARING RESTRICTIONS: No  FALLS:  Has patient fallen in last 6 months? No  LIVING ENVIRONMENT: Lives with: lives with their family and lives alone Lives in: House/apartment Stairs: No Has following equipment at home: has cane and walker but does not  use  OCCUPATION: retired  PLOF: Independent with basic ADLs, Independent with household mobility without device, Independent with community mobility without device, and Needs assistance with homemaking  PATIENT GOALS:  be able to eat, improve strength for bowel control, feel less pain and more energy to  expand social outlets and walking  NEXT MD VISIT: as needed  OBJECTIVE:  Note: Objective measures were completed at Evaluation unless otherwise noted.   The Patient-Specific Functional Scale  Initial:  I am going to ask you to identify up to 3 important activities that you are unable to do or are having difficulty with as a result of this problem.  Today are there any activities that you are unable to do or having difficulty with because of this?  (Patient shown scale and patient rated each activity)  Follow up: When you first came in you had difficulty performing these activities.  Today do you still have difficulty?    DIAGNOSTIC FINDINGS:    PATIENT SURVEYS:  PSEQ: 33/60 (<30 = low self-efficacy) Pain Catastrophizing Scale: 17 (>30 = clinically signif level of pain catastrophizing)  COGNITION: Overall cognitive status:  intact but high anxiety and depression      SENSATION: WFL  MUSCLE LENGTH: Adaptive shortening of Lt obliques, QL, lat secondary to rigid scoliosis  POSTURE: rounded shoulders, forward head, decreased lumbar lordosis, and increased thoracic kyphosis, severe degenerative scoliosis with Lt SB/Rt Rot, left ribcage in near contact with left ilium  PALPATION: Spasm present in bil upper traps Fascial and muscle mobility restriction along Lt side of trunk due to adaptive shortening from scoliosis: obliques, QL, hip flexors, lat  LUMBAR ROM:   AROM eval  Flexion full  Extension NT  Right lateral flexion 20%  Left lateral flexion 40%  Right rotation 40%  Left rotation 20%   (Blank rows = not tested)  LOWER EXTREMITY ROM:    WFL, flexion is full with  good hamstring length   LOWER EXTREMITY MMT:   Grossly 4/5 throughout, 4-/5 hip abduction, ER  FUNCTIONAL TESTS:    GAIT: Distance walked: within clinic Assistive device utilized: None Level of assistance: Modified independence Comments: short stride length  TREATMENT DATE:  11/27/23: Rt SL manual therapy - broadening and elongation of Lt obliques, QL, intercostals, paraspinals from cervical to lumbar, gluteals, hip flexors.  Gentle sacral distraction. Seated in chair posterior bil upper quadrant stripping, passive pec stretch bil Pt education: review of HEP from PF therex long holds vs quick flicks, Pt with poor ability to isolate PF so discussed allowance of "helper muscles" from gluteal squeeze addition  11/13/23: Rt SL manual therapy - broadening Lt obliques, QL, intercostals, paraspinals from cervical to lumbar, gluteals, hip flexors.  Gentle sacral distraction. Supine therex: SKTC alt LE x10, DKTC with rocking x30", hamstring stretch 2x30", fig 4 stretch 2x30" bil, abdominal stabilization with LE bicycle x20" and scissors at 90 deg hip flexion x 20" Seated in chair posterior bil upper quadrant stripping, passive pec stretch bil   11/06/23         Pt education about how depression can affect motivation, energy level, QOL, social isolation, and pain experience Discussed journaling and listing activities she knows she "can" do but isn't doing and to choose one to try  5 min rule from Cognitive Behavioral Therapy - try something for 5 min and you can stop then if you want to Discussed her mental block to getting a walker despite the health benefits it would allow her Discussed her bowel movement behaviors and diet - very little intake and inconsistent intake of protein  PATIENT EDUCATION:  Education details: Pt education (see above) Person educated: Patient Education  method: Explanation Education comprehension: verbalized understanding  HOME EXERCISE PROGRAM: Journaling to outline some activities to try  ASSESSMENT:  CLINICAL IMPRESSION: Pt arrives very negative and reporting a lot of pain and compression which continues to make her SOB and unable to eat much.  She continues to benefit greatly from manual therapy to address adaptive shortening of soft tissues and rigid curvature related to degenerative scoliosis.  We reviewed her PF HEP from past PT today and discussed that she can allow a gluteal squeeze to help overflow into PF contraction effort.   Pt has multiple factors contributing toward her pain experience including physical, mental, social and emotional.  PT encouraged her to start a gratitude journal to break out of her negative thoughts.  We discussed her need to continue seeking a home companion so she won't feel so isolated.  She seems to rule out every option so PT encouraged her to "not let perfect be the enemy of good."  PT also reminded her of the "5 min rule" to try starting something like knitting that she used to do and to perform for 5 min and see if she'd like to continue from there.  She will continue benefit from manual therapy, endurance training, and self-care strategies to reduce variables contributing to her QOL.  OBJECTIVE IMPAIRMENTS: cardiopulmonary status limiting activity, decreased activity tolerance, decreased coordination, decreased endurance, decreased mobility, decreased ROM, decreased strength, hypomobility, increased fascial restrictions, increased muscle spasms, impaired flexibility, impaired tone, improper body mechanics, postural dysfunction, and pain.   ACTIVITY LIMITATIONS: carrying, lifting, bending, sitting, standing, squatting, sleeping, stairs, transfers, bed mobility, continence, bathing, dressing, and locomotion level  PARTICIPATION LIMITATIONS: meal prep, cleaning, laundry, shopping, and community  activity  PERSONAL FACTORS: Age, Behavior pattern, Time since onset of injury/illness/exacerbation, and 1-2 comorbidities: pulmonary HTN, a-flutter, osteoporosis, severe degenerative scoliosis  are also affecting patient's functional outcome.   REHAB POTENTIAL: Good  CLINICAL DECISION MAKING: Evolving/moderate complexity  EVALUATION COMPLEXITY: Moderate   GOALS: Goals reviewed with patient? Yes  SHORT TERM GOALS: Target date: 11/27/23  Pt will journal and discover at least 1 activity to return to that she enjoys Baseline: Goal status: INITIAL  2.  Pt will strive to eat consistent small meals throughout the day to initiate a bowel routine Baseline:  Goal status: INITIAL  3.  Pt will report 20% less pain around abdomen and trunk to allow for comfort with small meals Baseline:  Goal status: INITIAL    LONG TERM GOALS: Target date: 01/01/24  Pt will return to walking program at least 2x/week for multi-system health benefits. Baseline:  Goal status: INITIAL  2.  Pt will be ind with HEP for gluteal and pelvic floor strength to reduce incidence of bowel leakage. Baseline:  Goal status: INITIAL  3.  Pt will improve score of PSEQ to at least 40 to demo improved self-efficacy within chronic pain. Baseline: 33 Goal status: INITIAL  4.  Pt will establish nighttime routine including Curable app, medidation, breathing and stretching routine to improve sleep stretches. Baseline:  Goal status: INITIAL  5.  Pt will report at least 50% less pain with daily tasks and eating secondary to reduced soft tissue compression surrounding scoliosis. Baseline:  Goal status: INITIAL    PLAN:  PT FREQUENCY: 1-2x/week  PT DURATION: 8 weeks  PLANNED INTERVENTIONS: 97110-Therapeutic exercises, 97530- Therapeutic activity, 97112- Neuromuscular re-education, 97535- Self Care, [id number]- Manual therapy, Dry Needling, Joint mobilization,  Spinal mobilization, Cryotherapy, and Moist heat.  PLAN FOR  NEXT SESSION: review pelvic floor therex from past PT, review journal if Pt brought, ambulation for endurance, manual therapy   Analucia Hush, PT 11/27/23 12:09 PM

## 2023-12-04 ENCOUNTER — Ambulatory Visit: Payer: Medicare Other | Attending: Family Medicine | Admitting: Physical Therapy

## 2023-12-04 ENCOUNTER — Encounter: Payer: Self-pay | Admitting: Physical Therapy

## 2023-12-04 DIAGNOSIS — M6281 Muscle weakness (generalized): Secondary | ICD-10-CM | POA: Insufficient documentation

## 2023-12-04 DIAGNOSIS — M546 Pain in thoracic spine: Secondary | ICD-10-CM | POA: Insufficient documentation

## 2023-12-04 DIAGNOSIS — R293 Abnormal posture: Secondary | ICD-10-CM | POA: Insufficient documentation

## 2023-12-04 DIAGNOSIS — M542 Cervicalgia: Secondary | ICD-10-CM | POA: Insufficient documentation

## 2023-12-04 DIAGNOSIS — M545 Low back pain, unspecified: Secondary | ICD-10-CM | POA: Insufficient documentation

## 2023-12-04 DIAGNOSIS — M62830 Muscle spasm of back: Secondary | ICD-10-CM | POA: Diagnosis not present

## 2023-12-04 DIAGNOSIS — M4125 Other idiopathic scoliosis, thoracolumbar region: Secondary | ICD-10-CM | POA: Diagnosis not present

## 2023-12-04 DIAGNOSIS — G8929 Other chronic pain: Secondary | ICD-10-CM | POA: Insufficient documentation

## 2023-12-04 NOTE — Therapy (Signed)
 OUTPATIENT PHYSICAL THERAPY THORACOLUMBAR EVALUATION   Patient Name: Janet Nguyen MRN: [id number] DOB:1932-07-07, 89 y.o., female Today's Date: 12/04/2023  END OF SESSION:  PT End of Session - 12/04/23 1101     Visit Number 4    Date for PT Re-Evaluation 01/01/24    Authorization Type Medicare part A/B - KX at visit 15    Progress Note Due on Visit 10    PT Start Time 1015    PT Stop Time 1103    PT Time Calculation (min) 48 min    Activity Tolerance Patient tolerated treatment well    Behavior During Therapy [facility] for tasks assessed/performed                Past Medical History:  Diagnosis Date   Arrhythmia    History of SVT with documented PVC'S and  PAC'S  12/08/12 Nuc stress test normal LV EF 74%  Event Monitor  12/01/12-01/03/13   Atrial flutter (HCC)    Celiac disease    treated by Dr. Kinnie Scales   GERD (gastroesophageal reflux disease)    Hypertension    Intervertebral disc stenosis of neural canal of cervical region    Irregular heart beat 11/30/2012   ECHO-EF 60-65%   Osteoporosis    PMR (polymyalgia rheumatica) (HCC)    Dr. Mallie Mussel; pt states she was diagnosed 10-15 years ago, not treated at this time or any issues that she is aware of.   Scoliosis    Scoliosis    Sleep apnea 10/02/11 [facility]   Sleep study AHI -total sleep 10.3/hr  64.0/ hr during REM sleep.RDI 22.8/hr during total sleep 64.0/hr during REM sleep The lowest O2 sat during Non-REM and REM sleep was 86% and 88% respectively. 04/08/12 CPAP/BIPAP titration study [facility]   Past Surgical History:  Procedure Laterality Date   ANAL RECTAL MANOMETRY N/A 09/16/2023   Procedure: ANO RECTAL MANOMETRY;  Surgeon: Willis Modena, MD;  Location: [facility];  Service: Gastroenterology;  Laterality: N/A;   APPENDECTOMY     ruptured at age 43 and had surgery   CARDIAC CATHETERIZATION  01/27/06   CARDIOVERSION N/A 01/08/2022   Procedure: CARDIOVERSION;  Surgeon:  Meriam Sprague, MD;  Location: [facility] END[facility]COPY;  Service: Cardiovascular;  Laterality: N/A;   cataract surgery  2015   Dr. Elmer Picker; March & April 2015   Patient Active Problem List   Diagnosis Date Noted   Dyspnea 05/30/2023   Pleural effusion 05/30/2023   Radon exposure 05/30/2023   Secondary hypercoagulable state (HCC) 12/12/2021   Hyponatremia 12/04/2021   Acute CHF (congestive heart failure) (HCC) 12/02/2021   Chronic diastolic heart failure (HCC) 12/02/2021   Atrial fibrillation with rapid ventricular response (HCC) 12/02/2021   Essential hypertension 12/02/2021   GERD without esophagitis 12/02/2021   Obstructive sleep apnea 12/02/2021   Interstitial lung disease (HCC) 12/02/2021   History of pulmonary embolism 12/02/2021   Pulmonary embolism (HCC) 11/06/2021   Bronchiectasis (HCC) 11/06/2021   Deviated septum 05/08/2020   Mass of subcutaneous tissue 05/02/2020   Vertigo 06/16/2019   Pulmonary hypertension, unspecified (HCC) 04/14/2019   Ischemic colitis (HCC) 04/14/2019   Migraine with aura and without status migrainosus, not intractable 11/08/2018   Degeneration of lumbar intervertebral disc 10/14/2018   Hoarseness of voice 03/04/2018   Abdominal pain 07/01/2017   Diverticulitis, colon    Metabolic acidosis, increased anion gap    Constipation 04/14/2017   Lumbar hernia 04/14/2017   Presbycusis of both ears  01/10/2017   Tinnitus aurium, bilateral 01/10/2017   Gastroesophageal reflux disease 08/20/2016   Hemoptysis 08/20/2016   Obstructive sleep apnea of adult 08/20/2016   Rhinitis, chronic 08/20/2016   Throat pain in adult 08/20/2016   Fatigue 12/23/2015   Sciatica of right side 10/06/2015   History of migraine headaches 10/06/2015   Frequent PVCs 12/28/2013   Premature atrial contractions 12/28/2013   PSVT (paroxysmal supraventricular tachycardia) (HCC) 12/28/2013   Heart palpitations 07/13/2013   Sleep apnea 04/11/2013   Scoliosis 04/11/2013   Atypical  atrial flutter (HCC) 11/29/2012   Chest pain, atypical 11/29/2012   Fibromyalgia syndrome 11/29/2012   Chronic steroid use 11/29/2012    PCP: [name], MD  REFERRING PROVIDER: [name], MD  REFERRING DIAG: M41.9 (ICD-10-CM) - Scoliosis  Rationale for Evaluation and Treatment: Rehabilitation  THERAPY DIAG:  Abnormal posture  Other idiopathic scoliosis, thoracolumbar region  Cervicalgia  Pain in thoracic spine  Chronic bilateral low back pain without sciatica  Muscle weakness (generalized)  ONSET DATE: chronic, years of progressive pain from degenerative scoliosis  SUBJECTIVE:                                                                                                                                                                                           SUBJECTIVE STATEMENT: I am always so out of breath and it gets in my way of doing things.  PERTINENT HISTORY:  Degenerative scoliosis, complex medical history including pulmonary HTN, frequent PVCs and a-flutter, history of cardioversion, osteoporosis Has been SOB with activity On waitlist to get into cardiologist sooner   PAIN:  PAIN:  Are you having pain? Yes NPRS scale: 6/10 Pain location: abdomen, left side of trunk, low back, intermittent bil hips/buttock/knee, Rt foot pains, neck Pain orientation: Other: see above   PAIN TYPE: aching, sharp, and tight Pain description: intermittent and constant  Aggravating factors: varies Relieving factors: unsure   PRECAUTIONS: Other: osteoporosis  RED FLAGS: None   WEIGHT BEARING RESTRICTIONS: No  FALLS:  Has patient fallen in last 6 months? No  LIVING ENVIRONMENT: Lives with: lives with their family and lives alone Lives in: House/apartment Stairs: No Has following equipment at home: has cane and walker but does not use  OCCUPATION: retired  PLOF: Independent with basic ADLs, Independent with household mobility without device,  Independent with community mobility without device, and Needs assistance with homemaking  PATIENT GOALS:  be able to eat, improve strength for bowel control, feel less pain and more energy to expand social outlets and walking  NEXT MD VISIT: as needed  OBJECTIVE:  Note: Objective measures were completed at  Evaluation unless otherwise noted.   The Patient-Specific Functional Scale  Initial:  I am going to ask you to identify up to 3 important activities that you are unable to do or are having difficulty with as a result of this problem.  Today are there any activities that you are unable to do or having difficulty with because of this?  (Patient shown scale and patient rated each activity)  Follow up: When you first came in you had difficulty performing these activities.  Today do you still have difficulty?    DIAGNOSTIC FINDINGS:    PATIENT SURVEYS:  PSEQ: 33/60 (<30 = low self-efficacy) Pain Catastrophizing Scale: 17 (>30 = clinically signif level of pain catastrophizing)  COGNITION: Overall cognitive status:  intact but high anxiety and depression      SENSATION: WFL  MUSCLE LENGTH: Adaptive shortening of Lt obliques, QL, lat secondary to rigid scoliosis  POSTURE: rounded shoulders, forward head, decreased lumbar lordosis, and increased thoracic kyphosis, severe degenerative scoliosis with Lt SB/Rt Rot, left ribcage in near contact with left ilium  PALPATION: Spasm present in bil upper traps Fascial and muscle mobility restriction along Lt side of trunk due to adaptive shortening from scoliosis: obliques, QL, hip flexors, lat  LUMBAR ROM:   AROM eval  Flexion full  Extension NT  Right lateral flexion 20%  Left lateral flexion 40%  Right rotation 40%  Left rotation 20%   (Blank rows = not tested)  LOWER EXTREMITY ROM:    WFL, flexion is full with good hamstring length   LOWER EXTREMITY MMT:   Grossly 4/5 throughout, 4-/5 hip abduction, ER  FUNCTIONAL TESTS:     GAIT: Distance walked: within clinic Assistive device utilized: None Level of assistance: Modified independence Comments: short stride length  TREATMENT DATE:  12/04/23: Rt SL manual therapy - broadening and elongation of Lt obliques, QL, intercostals, paraspinals from cervical to lumbar, gluteals, hip flexors.  Gentle sacral distraction. Rt SL with Lt LE and UE long reach to elongate trunk, PT cued slow inhale through nose to expand Lt ribcage and hold x3-5 sec, then exhale - 10 reps Rt SL pillow squeeze between knees with glut squeeze - PT providing TC at SITS bones for posterior PF squeeze Seated blue loop hip abd with glut squeeze 2x10 Seated in chair posterior bil upper quadrant stripping, passive pec stretch bil  11/27/23: Rt SL manual therapy - broadening and elongation of Lt obliques, QL, intercostals, paraspinals from cervical to lumbar, gluteals, hip flexors.  Gentle sacral distraction. Seated in chair posterior bil upper quadrant stripping, passive pec stretch bil Pt education: review of HEP from PF therex long holds vs quick flicks, Pt with poor ability to isolate PF so discussed allowance of "helper muscles" from gluteal squeeze addition  11/13/23: Rt SL manual therapy - broadening Lt obliques, QL, intercostals, paraspinals from cervical to lumbar, gluteals, hip flexors.  Gentle sacral distraction. Supine therex: SKTC alt LE x10, DKTC with rocking x30", hamstring stretch 2x30", fig 4 stretch 2x30" bil, abdominal stabilization with LE bicycle x20" and scissors at 90 deg hip flexion x 20" Seated in chair posterior bil upper quadrant stripping, passive pec stretch bil  PATIENT EDUCATION:  Education details: Pt education (see above) Person educated: Patient Education method: Explanation Education comprehension: verbalized understanding  HOME EXERCISE  PROGRAM: Journaling to outline some activities to try  ASSESSMENT:  CLINICAL IMPRESSION: Pt continues to benefit from manual therapy, stretching and strength training to support her spine in presence of degenerative scoliosis.  Pt is extremely compressed along Lt side of trunk and has near approximation of Lt lower anterior ribcage to ASIS.  She consequently has significant SOB, bowel and digestive stress and pain.  She was able to engage her posterior pelvic floor around her anus today with use of layering muscles of hips.  She had good response to Lt trunk elongation stretching with deep breathing today to expand tissues and encourage ribcage mobility along Lt short side of trunk.    OBJECTIVE IMPAIRMENTS: cardiopulmonary status limiting activity, decreased activity tolerance, decreased coordination, decreased endurance, decreased mobility, decreased ROM, decreased strength, hypomobility, increased fascial restrictions, increased muscle spasms, impaired flexibility, impaired tone, improper body mechanics, postural dysfunction, and pain.   ACTIVITY LIMITATIONS: carrying, lifting, bending, sitting, standing, squatting, sleeping, stairs, transfers, bed mobility, continence, bathing, dressing, and locomotion level  PARTICIPATION LIMITATIONS: meal prep, cleaning, laundry, shopping, and community activity  PERSONAL FACTORS: Age, Behavior pattern, Time since onset of injury/illness/exacerbation, and 1-2 comorbidities: pulmonary HTN, a-flutter, osteoporosis, severe degenerative scoliosis  are also affecting patient's functional outcome.   REHAB POTENTIAL: Good  CLINICAL DECISION MAKING: Evolving/moderate complexity  EVALUATION COMPLEXITY: Moderate   GOALS: Goals reviewed with patient? Yes  SHORT TERM GOALS: Target date: 11/27/23  Pt will journal and discover at least 1 activity to return to that she enjoys Baseline: Goal status: met, watching movies with neighbors 3/7  2.  Pt will strive to  eat consistent small meals throughout the day to initiate a bowel routine Baseline:  Goal status: ongoing, using Ensure most days of the week, 3/7  3.  Pt will report 20% less pain around abdomen and trunk to allow for comfort with small meals Baseline:  Goal status: ongoing 3/7    LONG TERM GOALS: Target date: 01/01/24  Pt will return to walking program at least 2x/week for multi-system health benefits. Baseline:  Goal status: INITIAL  2.  Pt will be ind with HEP for gluteal and pelvic floor strength to reduce incidence of bowel leakage. Baseline:  Goal status: ongoing 3/7  3.  Pt will improve score of PSEQ to at least 40 to demo improved self-efficacy within chronic pain. Baseline: 33 Goal status: INITIAL  4.  Pt will establish nighttime routine including Curable app, medidation, breathing and stretching routine to improve sleep stretches. Baseline:  Goal status: ongoing, working on compliance 3/7  5.  Pt will report at least 50% less pain with daily tasks and eating secondary to reduced soft tissue compression surrounding scoliosis. Baseline:  Goal status: INITIAL    PLAN:  PT FREQUENCY: 1-2x/week  PT DURATION: 8 weeks  PLANNED INTERVENTIONS: 97110-Therapeutic exercises, 97530- Therapeutic activity, [id number]- Neuromuscular re-education, 97535- Self Care, [id number]- Manual therapy, Dry Needling, Joint mobilization, Spinal mobilization, Cryotherapy, and Moist heat.  PLAN FOR NEXT SESSION: review pelvic floor therex from past PT, review journal if Pt brought, ambulation for endurance, manual therapy   Jaliyah Fotheringham, PT 12/04/23 12:15 PM

## 2023-12-08 NOTE — Progress Notes (Signed)
 Cardiology Office Note:    Date:  12/15/2023   ID:  Bintou, Lafata 1932-02-10, MRN [id number]  PCP:  [name], MD   [facility] Providers Cardiologist:  Jodelle Red, MD Cardiology APP:  Marcelino Duster, [place]     Referring MD: [name], MD   Chief Complaint  Patient presents with   Follow-up    PAF    History of Present Illness:    Janet Nguyen is a 89 y.o. female with a hx of SVT, PACs, PVCs treated with beta-blocker therapy, OSA not on CPAP, scoliosis, GERD, optical migraines, mild nonobstructive carotid artery disease, and chronic chest pain related to her posture from scoliosis.  She was referred to lumbar pulmonology for bronchiectasis and shortness of breath.  VQ scan showed positive PE 10/25/2021.  She was started on Eliquis and Spiriva.  She was diagnosed with atrial fibrillation during a hospitalization in March 2023.  Echocardiogram at that time revealed LVEF 60-65% moderate biatrial enlargement, and small circumferential pericardial effusion.  She was seen in follow-up in May 2023 with Dr. Tresa Endo and noted to be back in A-fib.  Cardizem was increased to 180 mg daily and metoprolol 75 mg twice daily was continued.  Given her pulmonary history, she is felt to not be a candidate for amiodarone.  She was referred to A-fib clinic.  Tikosyn load was planned but she called back and canceled the scheduled hospital admission.  She also self reduced her Cardizem to 120 mg due to questionable stomach upset.  In follow-up, rate control strategy was decided upon and digoxin was added to her regimen.  She has also seen Dr. Marchelle Gearing back and has not been satisfied with her appointments.  CT CAT scan did show enlarged pulmonary artery suggestive of pulmonary artery hypertension.  BNP in March was 640, pro-BNP 03/2022 was 870.  Due to bilateral lower extremity edema, Dr. Tresa Endo increased her dose of Lasix from 20 mg to 40 mg every other day.  I saw  her in follow up 06/05/22 with improved LE swelling. She continued 40 mg lasix daily. She was frustrated at not being able to continue yoga in the floor. Weight between 113-119 lbs - I reduced her dose of eliquis to 2.5 mg BID. She reports palpitations at night when going to bed. I saw her in Oct 2023  and she brought an excel printout of side effects of each of her medications. I referred out to dermatology and GI.  She was saw Dr. Tresa Endo 11/10/2023 and reported increased anxiety.  She had a prescription for Xanax but had not been taking it.  Her persistent atrial fibrillation was moderately rate controlled in the low 100s.  Dr. Tresa Endo opted to increase her metoprolol succinate to 100 mg twice daily.  She now presents for medication management and for atrial fibrillation rate control.  Dr. Tresa Endo as transition care to Dr. Jodelle Red at [facility].  She takes 100 mg metoprolol BID for rate control. She is appropriately dosed on eliquis. She is also on 0.0625 mg digoxin. She is taking 40 mg torsemide BID along with 10 mEq potassium. BMP stable in 08/2023.   She is SOB preventing walking. She continues to grocery shop at [employer] with a cart. From a cardiac perspective she is doing well.   Past Medical History:  Diagnosis Date   Arrhythmia    History of SVT with documented PVC'S and  PAC'S  12/08/12 Nuc stress test normal LV EF  74%  Event Monitor  12/01/12-01/03/13   Atrial flutter (HCC)    Celiac disease    treated by Dr. Kinnie Scales   GERD (gastroesophageal reflux disease)    Hypertension    Intervertebral disc stenosis of neural canal of cervical region    Irregular heart beat 11/30/2012   ECHO-EF 60-65%   Osteoporosis    PMR (polymyalgia rheumatica) (HCC)    Dr. Mallie Mussel; pt states she was diagnosed 10-15 years ago, not treated at this time or any issues that she is aware of.   Scoliosis    Scoliosis    Sleep apnea 10/02/11 [facility]   Sleep study AHI -total sleep 10.3/hr   64.0/ hr during REM sleep.RDI 22.8/hr during total sleep 64.0/hr during REM sleep The lowest O2 sat during Non-REM and REM sleep was 86% and 88% respectively. 04/08/12 CPAP/BIPAP titration study [facility]    Past Surgical History:  Procedure Laterality Date   ANAL RECTAL MANOMETRY N/A 09/16/2023   Procedure: ANO RECTAL MANOMETRY;  Surgeon: Willis Modena, MD;  Location: [facility] END[facility]COPY;  Service: Gastroenterology;  Laterality: N/A;   APPENDECTOMY     ruptured at age 38 and had surgery   CARDIAC CATHETERIZATION  01/27/06   CARDIOVERSION N/A 01/08/2022   Procedure: CARDIOVERSION;  Surgeon: Meriam Sprague, MD;  Location: [facility] ENDOSCOPY;  Service: Cardiovascular;  Laterality: N/A;   cataract surgery  2015   Dr. Elmer Picker; March & April 2015    Current Medications: Current Meds  Medication Sig   acetaminophen (TYLENOL) 500 MG tablet Take 500 mg by mouth every 8 (eight) hours as needed for moderate pain.   albuterol (VENTOLIN HFA) 108 (90 Base) MCG/ACT inhaler Inhale 2 puffs into the lungs every 4 (four) hours as needed.   Aloe-Sodium Chloride (AYR SALINE NASAL GEL NA) Place 1 application  into the nose daily as needed (rhinitis).   ALPRAZolam (XANAX) 0.5 MG tablet Take 0.25-0.5 mg by mouth 2 (two) times daily as needed for anxiety.   apixaban (ELIQUIS) 2.5 MG TABS tablet Take 1 tablet (2.5 mg total) by mouth 2 (two) times daily.   Biotin w/ Vitamins C & E (HAIR SKIN & NAILS GUMMIES PO) Take 1 tablet by mouth daily.   budeson-glycopyrrolate-formoterol (BREZTRI AEROSPHERE) 160-9-4.8 MCG/ACT AERO Inhale 2 puffs into the lungs in the morning and at bedtime.   Calcium Carb-Ergocalciferol (CHEWABLE CALCIUM/D PO) Take 1 each by mouth daily.   clidinium-chlordiazePOXIDE (LIBRAX) 5-2.5 MG capsule Take 1 capsule by mouth daily as needed.   digoxin (LANOXIN) 0.125 MG tablet TAKE 1/2 TABLET BY MOUTH DAILY   diltiazem (CARDIZEM CD) 120 MG 24 hr capsule TAKE 1 CAPSULE BY MOUTH DAILY    Doxylamine-Phenylephrine-APAP (SINUS & CONGESTION DAY/NIGHT) MISC 1 tablet Orally once a day As needed   fluticasone (FLONASE) 50 MCG/ACT nasal spray Place 1 spray into both nostrils daily as needed for allergies or rhinitis.   metoprolol succinate (TOPROL-XL) 50 MG 24 hr tablet Take 2 tablets (100 mg) in the morning.  Take 2 (100mg ) tablets in the evening.   mirtazapine (REMERON) 15 MG tablet Take 15 mg by mouth at bedtime.   nitrofurantoin, macrocrystal-monohydrate, (MACROBID) 100 MG capsule Take 100 mg by mouth 2 (two) times daily.   Phenylephrine-APAP-guaiFENesin (EQ SINUS CONGESTION & PAIN PO) Take 1 tablet by mouth daily as needed (for sinus pain). Contains acetaminophen 325 mg and Phenylephrine 5 mg   polyethylene glycol powder (GLYCOLAX/MIRALAX) 17 GM/SCOOP powder Take 17 g by mouth daily  as needed for moderate constipation.   potassium chloride (KLOR-CON M) 10 MEQ tablet TAKE 1 TABLET BY MOUTH DAILY   Tiotropium Bromide Monohydrate (SPIRIVA RESPIMAT) 1.25 MCG/ACT AERS Inhale 2 puffs into the lungs daily.   torsemide (DEMADEX) 20 MG tablet Take 2 tablets (40 mg total) by mouth daily. Take 1 Tablet in the Morning and 1 Tablet in the evening (Patient taking differently: Take 10 mg by mouth daily. Take 1 Tablet in the Morning and 1 Tablet in the evening)   zolpidem (AMBIEN) 5 MG tablet Take 2.5 mg by mouth at bedtime.     Allergies:   Gluten meal, Naproxen, and Amoxicillin   Social History   Socioeconomic History   Marital status: Divorced    Spouse name: Not on file   Number of children: 4   Years of education: college    Highest education level: Not on file  Occupational History   Occupation: retired   Tobacco Use   Smoking status: Never   Smokeless tobacco: Never   Tobacco comments:    Never smoke 12/12/21  Vaping Use   Vaping status: Never Used  Substance and Sexual Activity   Alcohol use: Yes    Alcohol/week: 3.0 - 4.0 standard drinks of alcohol    Types: 3 - 4 Glasses  of wine per week    Comment: 1 glass of wine 3-4 times weekly 12/12/21   Drug use: No   Sexual activity: Not on file  Other Topics Concern   Not on file  Social History Narrative   Lives alone at home   Drinks tea but tries to drink decaf   Has 9 grandchildren    Right handed   Social Drivers of Health   Financial Resource Strain: Low Risk  (12/03/2021)   Overall Financial Resource Strain (CARDIA)    Difficulty of Paying Living Expenses: Not hard at all  Food Insecurity: Low Risk  (08/11/2023)   Received from [facility]   Hunger Vital Sign    Worried About Running Out of Food in the Last Year: Never true    Ran Out of Food in the Last Year: Never true  Transportation Needs: No Transportation Needs (08/11/2023)   Received from [employer]    In the past 12 months, has lack of reliable transportation kept you from medical appointments, meetings, work or from getting things needed for daily living? : No  Physical Activity: Not on file  Stress: Not on file  Social Connections: Not on file     Family History: The patient's family history includes Breast cancer in her mother; Heart disease in her father. There is no history of Migraines.  ROS:   Please see the history of present illness.     All other systems reviewed and are negative.  EKGs/Labs/Other Studies Reviewed:    The following studies were reviewed today:  Cardiac Studies & Procedures   ______________________________________________________________________________________________     ECHOCARDIOGRAM  ECHOCARDIOGRAM COMPLETE 04/07/2023  Narrative ECHOCARDIOGRAM REPORT    Patient Name:   SAMANTHAN DUGO [facility] Date of Exam: 04/07/2023 Medical Rec #:  [id number]         Height:       65.0 in Accession #:    [id number]        Weight:       112.2 lb Date of Birth:  1932/03/04         BSA:          1.547 m Patient  Age:    89 years          BP:           92/60 mmHg Patient Gender: F                  HR:           85 bpm. Exam Location:  Outpatient  Procedure: 2D Echo, 3D Echo, Cardiac Doppler, Color Doppler and Strain Analysis  Indications:    PHTN  History:        Patient has prior history of Echocardiogram examinations, most recent 12/03/2021. CHF, Arrythmias:Atrial Flutter, PVC, PAC and Atrial Fibrillation; Risk Factors:Non-Smoker and Hypertension. Pulmonary Embolism.  Sonographer:    Jeryl Columbia RDCS Referring Phys: [phone] [facility]   Sonographer Comments: Image acquisition challenging due to patient body habitus and limited mobility. IMPRESSIONS   1. Left ventricular ejection fraction, by estimation, is 55 to 60%. The left ventricle has normal function. The left ventricle has no regional wall motion abnormalities. Left ventricular diastolic function could not be evaluated. 2. Right ventricular systolic function is normal. The right ventricular size is normal. There is moderately elevated pulmonary artery systolic pressure. The estimated right ventricular systolic pressure is 51.0 mmHg. 3. Left atrial size was severely dilated. 4. Right atrial size was severely dilated. 5. The mitral valve is myxomatous. Moderate to severe mitral valve regurgitation. No evidence of mitral stenosis. There is moderate holosystolic prolapse of the middle scallop of the posterior leaflet of the mitral valve. 6. Tricuspid valve regurgitation is mild to moderate. 7. The aortic valve is tricuspid. Aortic valve regurgitation is not visualized. Aortic valve sclerosis/calcification is present, without any evidence of aortic stenosis. 8. Pulmonic valve regurgitation is moderate. 9. The inferior vena cava is normal in size with <50% respiratory variability, suggesting right atrial pressure of 8 mmHg. 10. Consider TEE if clinically indicated and if patient is a candidate for mitraclip.  FINDINGS Left Ventricle: Left ventricular ejection fraction, by estimation, is 55 to 60%. The left ventricle has  normal function. The left ventricle has no regional wall motion abnormalities. Global longitudinal strain performed but not reported based on interpreter judgement due to suboptimal tracking. The left ventricular internal cavity size was normal in size. There is no left ventricular hypertrophy. Left ventricular diastolic function could not be evaluated due to atrial fibrillation. Left ventricular diastolic function could not be evaluated.  Right Ventricle: The right ventricular size is normal. No increase in right ventricular wall thickness. Right ventricular systolic function is normal. There is moderately elevated pulmonary artery systolic pressure. The tricuspid regurgitant velocity is 3.00 m/s, and with an assumed right atrial pressure of 15 mmHg, the estimated right ventricular systolic pressure is 51.0 mmHg.  Left [facility]: Left atrial size was severely dilated.  Right [facility]: Right atrial size was severely dilated.  Pericardium: There is no evidence of pericardial effusion.  Mitral Valve: The mitral valve is myxomatous. There is moderate holosystolic prolapse of the middle scallop of the posterior leaflet of the mitral valve. There is mild calcification of the mitral valve leaflet(s). Mild to moderate mitral annular calcification. Moderate to severe mitral valve regurgitation, with wall-impinging jet. No evidence of mitral valve stenosis.  Tricuspid Valve: The tricuspid valve is normal in structure. Tricuspid valve regurgitation is mild to moderate. No evidence of tricuspid stenosis.  Aortic Valve: The aortic valve is tricuspid. Aortic valve regurgitation is not visualized. Aortic valve sclerosis/calcification is present, without any evidence of aortic stenosis.  Pulmonic Valve:  The pulmonic valve was normal in structure. Pulmonic valve regurgitation is moderate. No evidence of pulmonic stenosis.  Aorta: The aortic root is normal in size and structure.  Venous: The inferior vena cava is  normal in size with less than 50% respiratory variability, suggesting right atrial pressure of 8 mmHg.  IAS/Shunts: No atrial level shunt detected by color flow Doppler.   LEFT VENTRICLE PLAX 2D LVIDd:         4.13 cm   Diastology LVIDs:         2.49 cm   LV e' medial:    7.83 cm/s LV PW:         1.04 cm   LV E/e' medial:  18.3 LV IVS:        0.76 cm   LV e' lateral:   9.36 cm/s LVOT diam:     1.90 cm   LV E/e' lateral: 15.3 LV SV:         33 LV SV Index:   21 LVOT Area:     2.84 cm  3D Volume EF: 3D EF:        56 % LV EDV:       94 ml LV ESV:       41 ml LV SV:        53 ml  RIGHT VENTRICLE RV Basal diam:  3.90 cm RV Mid diam:    3.62 cm RV S prime:     9.25 cm/s TAPSE (M-mode): 2.2 cm  LEFT ATRIUM             Index        RIGHT ATRIUM           Index LA diam:        4.60 cm 2.97 cm/m   RA Area:     26.80 cm LA Vol (A2C):   68.9 ml 44.53 ml/m  RA Volume:   92.60 ml  59.85 ml/m LA Vol (A4C):   80.4 ml 51.96 ml/m LA Biplane Vol: 75.3 ml 48.67 ml/m AORTIC VALVE LVOT Vmax:   77.40 cm/s LVOT Vmean:  50.400 cm/s LVOT VTI:    0.117 m  AORTA Ao Root diam: 3.60 cm Ao Asc diam:  3.30 cm  MITRAL VALVE                  TRICUSPID VALVE MV Area (PHT): 4.89 cm       TR Peak grad:   36.0 mmHg MV Decel Time: 155 msec       TR Vmax:        300.00 cm/s MR Peak grad:    65.6 mmHg MR Mean grad:    49.0 mmHg    SHUNTS MR Vmax:         405.00 cm/s  Systemic VTI:  0.12 m MR Vmean:        339.0 cm/s   Systemic Diam: 1.90 cm MR PISA:         3.08 cm MR PISA Eff ROA: 23 mm MR PISA Radius:  0.70 cm MV E velocity: 143.00 cm/s MV A velocity: 42.10 cm/s MV E/A ratio:  3.40  Armanda Magic MD Electronically signed by Armanda Magic MD Signature Date/Time: 04/07/2023/2:46:44 PM    Final          ______________________________________________________________________________________________            Recent Labs: 03/30/2023: BNP 1,060.8 09/09/2023: BUN 24; Creatinine,  Ser 1.21; Hemoglobin 14.5; Platelets 272.0; Potassium 4.1; Pro B  Natriuretic peptide (BNP) 1,135.0; Sodium 132  Recent Lipid Panel    Component Value Date/Time   CHOL 154 12/03/2016 0845   TRIG 81 12/03/2016 0845   HDL 67 12/03/2016 0845   CHOLHDL 2.3 12/03/2016 0845   VLDL 16 12/03/2016 0845   LDLCALC 71 12/03/2016 0845     Risk Assessment/Calculations:    CHA2DS2-VASc Score = 4   This indicates a 4.8% annual risk of stroke. The patient's score is based upon: CHF History: 1 HTN History: 0 Diabetes History: 0 Stroke History: 0 Vascular Disease History: 0 Age Score: 2 Gender Score: 1             Physical Exam:    VS:  BP 90/60   Pulse 67   Ht 5' (1.524 m)   Wt 108 lb (49 kg)   SpO2 100%   BMI 21.09 kg/m     Wt Readings from Last 3 Encounters:  12/15/23 108 lb (49 kg)  12/09/23 107 lb 8 oz (48.8 kg)  11/10/23 108 lb (49 kg)     GEN:  Well nourished, well developed in no acute distress HEENT: Normal NECK: No JVD; No carotid bruits LYMPHATICS: No lymphadenopathy CARDIAC: irregular rhythm regular rateRESPIRATORY:  Clear to auscultation without rales, wheezing or rhonchi  ABDOMEN: Soft, non-tender, non-distended MUSCULOSKELETAL:  No edema; No deformity  SKIN: Warm and dry NEUROLOGIC:  Alert and oriented x 3 PSYCHIATRIC:  Normal affect   ASSESSMENT:    1. Persistent atrial fibrillation (HCC)   2. Chronic anticoagulation   3. Frequent PVCs   4. PSVT (paroxysmal supraventricular tachycardia) (HCC)   5. Pulmonary hypertension, unspecified (HCC)   6. Moderate to severe mitral regurgitation   7. History of pulmonary embolism   8. Bronchiectasis without complication (HCC)   9. Obstructive sleep apnea of adult    PLAN:    In order of problems listed above:  Persistent atrial fibrillation SVT, PACs, PVCs Rate controlled on 100 mg metoprolol BID, Cardizem 120 mg, digoxin - digoxin level 06/06/22 was < 0.4 -Not a candidate for amiodarone given underlying lung  disease -She is recommended for Tikosyn initiation but she later canceled that admission -- will continue rate control for now -- I will repeat a digoxin level - she takes this medication at night - will need a digoxin level    History of PE, clear on recent CT chest Chronic anticoagulation No bleeding issues on Eliquis She is currently on 5 mg Eliquis twice daily.  However, given her age and weight of 55 kg I will reduce this to 2.5 mg twice daily We will continue Eliquis given her PAF -- she develops lesions that she thinks are due to eliquis, but look more like eczema to me, not consistent with bruising   ?pulmonary hypertension Moderate MR Bilateral lower extremity edema BNP 515 Stable on 40 mg lasix daily BMP stable    OSA not on CPAP Has mouth guard but does not use   Follow up in 4-6 months.             Medication Adjustments/Labs and Tests Ordered: Current medicines are reviewed at length with the patient today.  Concerns regarding medicines are outlined above.  Orders Placed This Encounter  Procedures   Digoxin level   No orders of the defined types were placed in this encounter.   Patient Instructions  Medication Instructions:  Your physician recommends that you continue on your current medications as directed. Please refer to the Current Medication list  given to you today.  *If you need a refill on your cardiac medications before your next appointment, please call your pharmacy*   Lab Work: Digoxin level at your convenience. Have lab work completed after 3:00 PM.   Testing/Procedures: NONE ordered at this time of appointment   Follow-Up: At [facility], you and your health needs are our priority.  As part of our continuing mission to provide you with exceptional heart care, we have created designated Provider Care Teams.  These Care Teams include your primary Cardiologist (physician) and Advanced Practice Providers (APPs -  Physician  Assistants and Nurse Practitioners) who all work together to provide you with the care you need, when you need it.  We recommend signing up for the patient portal called "MyChart".  Sign up information is provided on this After Visit Summary.  MyChart is used to connect with patients for Virtual Visits (Telemedicine).  Patients are able to view lab/test results, encounter notes, upcoming appointments, etc.  Non-urgent messages can be sent to your provider as well.   To learn more about what you can do with MyChart, go to [email or web address].    Your next appointment:    1st available appointment   Provider:   Jodelle Red, MD    Other Instructions   1st Floor: - Lobby - Registration  - Pharmacy  - Lab - Cafe  2nd Floor: - PV Lab - Diagnostic Testing (echo, CT, nuclear med)  3rd Floor: - Vacant  4th Floor: - TCTS (cardiothoracic surgery) - AFib Clinic - Structural Heart Clinic - Vascular Surgery  - Vascular Ultrasound  5th Floor: - HeartCare Cardiology (general and EP) - Clinical Pharmacy for coumadin, hypertension, lipid, weight-loss medications, and med management appointments    Valet parking services will be available as well.         Signed, Marcelino Duster, [place]  12/15/2023 11:51 AM    [facility]

## 2023-12-09 ENCOUNTER — Ambulatory Visit (HOSPITAL_BASED_OUTPATIENT_CLINIC_OR_DEPARTMENT_OTHER): Payer: Medicare Other | Admitting: Pulmonary Disease

## 2023-12-09 ENCOUNTER — Other Ambulatory Visit (HOSPITAL_COMMUNITY): Payer: Self-pay

## 2023-12-09 ENCOUNTER — Ambulatory Visit (HOSPITAL_BASED_OUTPATIENT_CLINIC_OR_DEPARTMENT_OTHER)

## 2023-12-09 ENCOUNTER — Encounter (HOSPITAL_BASED_OUTPATIENT_CLINIC_OR_DEPARTMENT_OTHER): Payer: Self-pay | Admitting: Pulmonary Disease

## 2023-12-09 VITALS — BP 116/74 | HR 66 | Ht 60.0 in | Wt 107.5 lb

## 2023-12-09 DIAGNOSIS — R918 Other nonspecific abnormal finding of lung field: Secondary | ICD-10-CM | POA: Diagnosis not present

## 2023-12-09 DIAGNOSIS — J479 Bronchiectasis, uncomplicated: Secondary | ICD-10-CM

## 2023-12-09 DIAGNOSIS — J90 Pleural effusion, not elsewhere classified: Secondary | ICD-10-CM | POA: Diagnosis not present

## 2023-12-09 DIAGNOSIS — R0609 Other forms of dyspnea: Secondary | ICD-10-CM

## 2023-12-09 DIAGNOSIS — I517 Cardiomegaly: Secondary | ICD-10-CM | POA: Diagnosis not present

## 2023-12-09 DIAGNOSIS — I700 Atherosclerosis of aorta: Secondary | ICD-10-CM | POA: Diagnosis not present

## 2023-12-09 MED ORDER — BREZTRI AEROSPHERE 160-9-4.8 MCG/ACT IN AERO: 2.0000 | INHALATION_SPRAY | Freq: Two times a day (BID) | RESPIRATORY_TRACT | Status: DC

## 2023-12-09 NOTE — Progress Notes (Signed)
 Subjective:   PATIENT ID: Janet Nguyen GENDER: female DOB: 1932-04-01, MRN: [id number]  Chief Complaint  Patient presents with   Follow-up    Bronchiectasis    Reason for Visit: Follow-up   Ms. Roseland Braun is a 89 year old female with bronchiectasis, scoliosis, hx PE 09/2021, hx OSA not on CPAP who presents with follow-up.  10/02/23 She is a patient of Dr. Marchelle Gearing and has intermittently tried Spiriva but having difficulty handling the inhaler. Review of prior notes demonstrates she has had difficulty with flutter valve in the past as well. She is using albuterol 2-4 times a day and not on Spiriva due to inability to use. She was also unsure if she could use Spiriva with her Albuterol. Still has shortness of breath.  12/09/23 Since our last visit she reports her shortness of breath is unchanged. In the past she has been able to participate in yoga since retirement and feels her scoliosis/back pain has worsened. Has been participating in physical therapy on and off for >1 year. Currently enrolled in rehab this month. She reports shortness of breath that she attributes to her heart conditions, nasal congestion. She does report nose bleeds daily and knows the Eliquis increases her risk for this. She reports she has been using Spiriva daily but does not feel like it has been effective. Reports shortness of breath and chest tightness. Symptoms can occur at night. Denies wheezing.  Environmental exposures: Radon   Past Medical History:  Diagnosis Date   Arrhythmia    History of SVT with documented PVC'S and  PAC'S  12/08/12 Nuc stress test normal LV EF 74%  Event Monitor  12/01/12-01/03/13   Atrial flutter (HCC)    Celiac disease    treated by Dr. Kinnie Scales   GERD (gastroesophageal reflux disease)    Hypertension    Intervertebral disc stenosis of neural canal of cervical region    Irregular heart beat 11/30/2012   ECHO-EF 60-65%   Osteoporosis    PMR (polymyalgia rheumatica) (HCC)     Dr. Mallie Mussel; pt states she was diagnosed 10-15 years ago, not treated at this time or any issues that she is aware of.   Scoliosis    Scoliosis    Sleep apnea 10/02/11 [facility]   Sleep study AHI -total sleep 10.3/hr  64.0/ hr during REM sleep.RDI 22.8/hr during total sleep 64.0/hr during REM sleep The lowest O2 sat during Non-REM and REM sleep was 86% and 88% respectively. 04/08/12 CPAP/BIPAP titration study [facility]     Family History  Problem Relation Age of Onset   Breast cancer Mother    Heart disease Father    Migraines Neg Hx      Social History   Occupational History   Occupation: retired   Tobacco Use   Smoking status: Never   Smokeless tobacco: Never   Tobacco comments:    Never smoke 12/12/21  Vaping Use   Vaping status: Never Used  Substance and Sexual Activity   Alcohol use: Yes    Alcohol/week: 3.0 - 4.0 standard drinks of alcohol    Types: 3 - 4 Glasses of wine per week    Comment: 1 glass of wine 3-4 times weekly 12/12/21   Drug use: No   Sexual activity: Not on file    Allergies  Allergen Reactions   Gluten Meal Other (See Comments)    Unknown   Naproxen Other (See Comments)    Stomach upset  Amoxicillin Rash     Outpatient Medications Prior to Visit  Medication Sig Dispense Refill   acetaminophen (TYLENOL) 500 MG tablet Take 500 mg by mouth every 8 (eight) hours as needed for moderate pain.     albuterol (VENTOLIN HFA) 108 (90 Base) MCG/ACT inhaler Inhale 2 puffs into the lungs every 4 (four) hours as needed.     Aloe-Sodium Chloride (AYR SALINE NASAL GEL NA) Place 1 application  into the nose daily as needed (rhinitis).     ALPRAZolam (XANAX) 0.5 MG tablet Take 0.25-0.5 mg by mouth 2 (two) times daily as needed for anxiety.     apixaban (ELIQUIS) 2.5 MG TABS tablet Take 1 tablet (2.5 mg total) by mouth 2 (two) times daily. 28 tablet    Biotin w/ Vitamins C & E (HAIR SKIN & NAILS GUMMIES PO) Take 1 tablet  by mouth daily.     Calcium Carb-Ergocalciferol (CHEWABLE CALCIUM/D PO) Take 1 each by mouth daily.     clidinium-chlordiazePOXIDE (LIBRAX) 5-2.5 MG capsule Take 1 capsule by mouth daily as needed. 60 capsule 3   digoxin (LANOXIN) 0.125 MG tablet TAKE 1/2 TABLET BY MOUTH DAILY 45 tablet 3   diltiazem (CARDIZEM CD) 120 MG 24 hr capsule TAKE 1 CAPSULE BY MOUTH DAILY 90 capsule 3   Doxylamine-Phenylephrine-APAP (SINUS & CONGESTION DAY/NIGHT) MISC 1 tablet Orally once a day As needed     metoprolol succinate (TOPROL-XL) 50 MG 24 hr tablet Take 2 tablets (100 mg) in the morning.  Take 2 (100mg ) tablets in the evening. 315 tablet 3   Phenylephrine-APAP-guaiFENesin (EQ SINUS CONGESTION & PAIN PO) Take 1 tablet by mouth daily as needed (for sinus pain). Contains acetaminophen 325 mg and Phenylephrine 5 mg     potassium chloride (KLOR-CON M) 10 MEQ tablet TAKE 1 TABLET BY MOUTH DAILY 90 tablet 3   Tiotropium Bromide Monohydrate (SPIRIVA RESPIMAT) 1.25 MCG/ACT AERS Inhale 2 puffs into the lungs daily. 4 g 5   torsemide (DEMADEX) 20 MG tablet Take 2 tablets (40 mg total) by mouth daily. Take 1 Tablet in the Morning and 1 Tablet in the evening (Patient taking differently: Take 10 mg by mouth daily. Take 1 Tablet in the Morning and 1 Tablet in the evening) 180 tablet 3   zolpidem (AMBIEN) 5 MG tablet Take 2.5 mg by mouth at bedtime.     fluticasone (FLONASE) 50 MCG/ACT nasal spray Place 1 spray into both nostrils daily as needed for allergies or rhinitis. (Patient not taking: Reported on 12/09/2023)     mirtazapine (REMERON) 15 MG tablet Take 15 mg by mouth at bedtime. (Patient not taking: Reported on 12/09/2023)     nitrofurantoin, macrocrystal-monohydrate, (MACROBID) 100 MG capsule Take 100 mg by mouth 2 (two) times daily. (Patient not taking: Reported on 12/09/2023)     polyethylene glycol powder (GLYCOLAX/MIRALAX) 17 GM/SCOOP powder Take 17 g by mouth daily as needed for moderate constipation. (Patient not  taking: Reported on 12/09/2023)     No facility-administered medications prior to visit.    Review of Systems  Constitutional:  Negative for chills, diaphoresis, fever, malaise/fatigue and weight loss.  HENT:  Negative for congestion.   Respiratory:  Positive for shortness of breath. Negative for cough, hemoptysis, sputum production and wheezing.   Cardiovascular:  Negative for chest pain, palpitations and leg swelling.     Objective:   Vitals:   12/09/23 1116  BP: 116/74  Pulse: 66  SpO2: 95%  Weight: 107 lb 8 oz (48.8 kg)  Height: 5' (1.524 m)   SpO2: 95 %  Physical Exam: General: Elderly, well-appearing, no acute distress HENT: [place], AT Eyes: EOMI, no scleral icterus Respiratory: Clear to auscultation bilaterally.  No crackles, wheezing or rales Cardiovascular: RRR, -M/R/G, no JVD Extremities:-Edema,-tenderness Neuro: AAO x4, CNII-XII grossly intact Psych: Normal mood, normal affect  Data Reviewed:  Imaging: CXR 03/02/23 - Bilateral pleural effusion CT Chest 02/20/23 - Loculated right pleural effusion and left pleural effusion. Chronic atelectasis in RML and lingula     Assessment & Plan:   Discussion: 89 year old female with bronchiectasis, scoliosis, hx PE 09/2021, hx OSA not on CPAP who presents with follow-up.  Shortness of breath - likely multifactorial including bronchiectasis, loculated pleural effusion, deconditioning in setting of scoliosis, chronic cardiac issues --CXR today --START Breztri sample for two weeks. Use the spacer to help  >Call me if sample is effective and I can order Breyna ($152) --CONTINUE Albuterol every 4 hours AS NEEDED for rescue. Try to minimize as this can worsen your atrial fibrillation  Health Maintenance Immunization History  Administered Date(s) Administered   Fluad Quad(high Dose 65+) 07/11/2022   Fluzone Influenza virus vaccine,trivalent (IIV3), split virus 06/29/2018   Influenza Split 06/26/2014   Influenza, High Dose  Seasonal PF 06/15/2015, 06/19/2016, 07/24/2017, 05/24/2019, 06/20/2021   Influenza,inj,quad, With Preservative 06/29/2018   Influenza-Unspecified 06/30/2013   PFIZER(Purple Top)SARS-COV-2 Vaccination 10/14/2019, 11/04/2019, 07/18/2020, 07/01/2021   Pneumococcal Conjugate-13 03/09/2014   Tdap 03/21/2009, 05/29/2021    Orders Placed This Encounter  Procedures   DG Chest 2 View    Standing Status:   Future    Number of Occurrences:   1    Expiration Date:   12/08/2024    Reason for Exam (SYMPTOM  OR DIAGNOSIS REQUIRED):   history of pleural effusions    Preferred imaging location?:   [facility]  No orders of the defined types were placed in this encounter.   Return in about 6 months (around 06/10/2024) for 30 min slot.  I have spent a total time of 35-minutes on the day of the appointment including chart review, data review, collecting history, coordinating care and discussing medical diagnosis and plan with the patient/family. Past medical history, allergies, medications were reviewed. Pertinent imaging, labs and tests included in this note have been reviewed and interpreted independently by me.  Fremon Zacharia Mechele Collin, MD [facility] 12/09/2023 12:10 PM

## 2023-12-09 NOTE — Addendum Note (Signed)
 Addended by: Amada Kingfisher on: 12/09/2023 01:55 PM   Modules accepted: Orders

## 2023-12-09 NOTE — Patient Instructions (Signed)
  Shortness of breath - likely multifactorial including bronchiectasis, loculated pleural effusion, deconditioning in setting of scoliosis, chronic cardiac issues --CXR today --START Breztri sample for two weeks. Use the spacer to help  >Call me if sample is effective and I can order Breyna ($152 for 30 day supply) --CONTINUE Albuterol every 4 hours AS NEEDED for rescue. Try to minimize as this can worsen your atrial fibrillation

## 2023-12-11 ENCOUNTER — Telehealth: Payer: Self-pay | Admitting: Pulmonary Disease

## 2023-12-11 DIAGNOSIS — H04123 Dry eye syndrome of bilateral lacrimal glands: Secondary | ICD-10-CM | POA: Diagnosis not present

## 2023-12-11 DIAGNOSIS — H40013 Open angle with borderline findings, low risk, bilateral: Secondary | ICD-10-CM | POA: Diagnosis not present

## 2023-12-11 DIAGNOSIS — H353132 Nonexudative age-related macular degeneration, bilateral, intermediate dry stage: Secondary | ICD-10-CM | POA: Diagnosis not present

## 2023-12-11 DIAGNOSIS — H1132 Conjunctival hemorrhage, left eye: Secondary | ICD-10-CM | POA: Diagnosis not present

## 2023-12-11 DIAGNOSIS — H524 Presbyopia: Secondary | ICD-10-CM | POA: Diagnosis not present

## 2023-12-11 NOTE — Telephone Encounter (Signed)
 PT states the [facility] she was given was not working well for her. (Feels sick, stomach issues in the morning, not positive if it is the [place], burning of the mouth and tongue redness. This kept her awake all night.)  She would like samples of the Spiriva samples put out for her for Monday Pick up. Please call her @ [phone]

## 2023-12-11 NOTE — Telephone Encounter (Signed)
 Please advise as per your recent note pt has a hard time handling the Spiriva ,pt was notified on identifiable VM you are out of town until Tuesday I also did advise pt to rinse her mouth after each use

## 2023-12-15 ENCOUNTER — Other Ambulatory Visit (HOSPITAL_COMMUNITY): Payer: Self-pay

## 2023-12-15 ENCOUNTER — Encounter (HOSPITAL_BASED_OUTPATIENT_CLINIC_OR_DEPARTMENT_OTHER): Payer: Self-pay | Admitting: Pulmonary Disease

## 2023-12-15 ENCOUNTER — Telehealth: Payer: Self-pay | Admitting: Physician Assistant

## 2023-12-15 ENCOUNTER — Ambulatory Visit: Payer: Medicare Other | Attending: Physician Assistant | Admitting: Physician Assistant

## 2023-12-15 ENCOUNTER — Encounter: Payer: Self-pay | Admitting: Physician Assistant

## 2023-12-15 VITALS — BP 90/60 | HR 67 | Ht 60.0 in | Wt 108.0 lb

## 2023-12-15 DIAGNOSIS — I493 Ventricular premature depolarization: Secondary | ICD-10-CM | POA: Diagnosis not present

## 2023-12-15 DIAGNOSIS — I272 Pulmonary hypertension, unspecified: Secondary | ICD-10-CM | POA: Insufficient documentation

## 2023-12-15 DIAGNOSIS — J479 Bronchiectasis, uncomplicated: Secondary | ICD-10-CM | POA: Insufficient documentation

## 2023-12-15 DIAGNOSIS — G4733 Obstructive sleep apnea (adult) (pediatric): Secondary | ICD-10-CM | POA: Diagnosis not present

## 2023-12-15 DIAGNOSIS — I340 Nonrheumatic mitral (valve) insufficiency: Secondary | ICD-10-CM | POA: Insufficient documentation

## 2023-12-15 DIAGNOSIS — I4819 Other persistent atrial fibrillation: Secondary | ICD-10-CM | POA: Diagnosis not present

## 2023-12-15 DIAGNOSIS — I471 Supraventricular tachycardia, unspecified: Secondary | ICD-10-CM | POA: Insufficient documentation

## 2023-12-15 DIAGNOSIS — Z86711 Personal history of pulmonary embolism: Secondary | ICD-10-CM | POA: Diagnosis not present

## 2023-12-15 DIAGNOSIS — Z7901 Long term (current) use of anticoagulants: Secondary | ICD-10-CM | POA: Diagnosis not present

## 2023-12-15 MED ORDER — APIXABAN 2.5 MG PO TABS: 2.5000 mg | ORAL_TABLET | Freq: Two times a day (BID) | ORAL | Status: DC

## 2023-12-15 NOTE — Patient Instructions (Addendum)
 Medication Instructions:  Your physician recommends that you continue on your current medications as directed. Please refer to the Current Medication list given to you today.  *If you need a refill on your cardiac medications before your next appointment, please call your pharmacy*   Lab Work: Digoxin level at your convenience. Have lab work completed after 3:00 PM.   Testing/Procedures: NONE ordered at this time of appointment   Follow-Up: At [facility], you and your health needs are our priority.  As part of our continuing mission to provide you with exceptional heart care, we have created designated Provider Care Teams.  These Care Teams include your primary Cardiologist (physician) and Advanced Practice Providers (APPs -  Physician Assistants and Nurse Practitioners) who all work together to provide you with the care you need, when you need it.  We recommend signing up for the patient portal called "MyChart".  Sign up information is provided on this After Visit Summary.  MyChart is used to connect with patients for Virtual Visits (Telemedicine).  Patients are able to view lab/test results, encounter notes, upcoming appointments, etc.  Non-urgent messages can be sent to your provider as well.   To learn more about what you can do with MyChart, go to [email or web address].    Your next appointment:    1st available appointment   Provider:   Jodelle Red, MD    Other Instructions   1st Floor: - Lobby - Registration  - Pharmacy  - Lab - Cafe  2nd Floor: - PV Lab - Diagnostic Testing (echo, CT, nuclear med)  3rd Floor: - Vacant  4th Floor: - TCTS (cardiothoracic surgery) - AFib Clinic - Structural Heart Clinic - Vascular Surgery  - Vascular Ultrasound  5th Floor: - [facility] (general and EP) - Clinical Pharmacy for coumadin, hypertension, lipid, weight-loss medications, and med management appointments    Valet parking services will  be available as well.

## 2023-12-15 NOTE — Telephone Encounter (Signed)
 Patient calling the office for samples of medication:   1.  What medication and dosage are you requesting samples for?  apixaban (ELIQUIS) 2.5 MG TABS tablet   2.  Are you currently out of this medication? Pt saw A [facility] today and she was going to check on samples for the pt but forgot to do so. Please advise

## 2023-12-15 NOTE — Telephone Encounter (Signed)
Spoke with pt, aware samples are at the front desk for pick up. 

## 2023-12-16 NOTE — Telephone Encounter (Signed)
 Called patient back. No answer. Left voicemail.  OK to stop [employer].   Advised to continue Spiriva. Patient has not yet picked up samples. OK to provide 2-4 week supply if she comes by.

## 2023-12-17 ENCOUNTER — Ambulatory Visit: Payer: Medicare Other | Admitting: Cardiovascular Disease

## 2023-12-18 ENCOUNTER — Ambulatory Visit: Payer: Medicare Other | Admitting: Physical Therapy

## 2023-12-18 ENCOUNTER — Encounter: Payer: Self-pay | Admitting: Physical Therapy

## 2023-12-18 DIAGNOSIS — M542 Cervicalgia: Secondary | ICD-10-CM | POA: Diagnosis not present

## 2023-12-18 DIAGNOSIS — R293 Abnormal posture: Secondary | ICD-10-CM

## 2023-12-18 DIAGNOSIS — M545 Low back pain, unspecified: Secondary | ICD-10-CM | POA: Diagnosis not present

## 2023-12-18 DIAGNOSIS — G8929 Other chronic pain: Secondary | ICD-10-CM

## 2023-12-18 DIAGNOSIS — M6281 Muscle weakness (generalized): Secondary | ICD-10-CM

## 2023-12-18 DIAGNOSIS — M4125 Other idiopathic scoliosis, thoracolumbar region: Secondary | ICD-10-CM | POA: Diagnosis not present

## 2023-12-18 DIAGNOSIS — M546 Pain in thoracic spine: Secondary | ICD-10-CM

## 2023-12-18 DIAGNOSIS — M62830 Muscle spasm of back: Secondary | ICD-10-CM

## 2023-12-18 NOTE — Therapy (Signed)
 OUTPATIENT PHYSICAL THERAPY THORACOLUMBAR EVALUATION   Patient Name: Janet Nguyen MRN: [id number] DOB:1931-11-20, 89 y.o., female Today's Date: 12/18/2023  END OF SESSION:  PT End of Session - 12/18/23 1103     Visit Number 5    Date for PT Re-Evaluation 01/01/24    Authorization Type Medicare part A/B - KX at visit 15    Progress Note Due on Visit 10    PT Start Time 1103    PT Stop Time 1147    PT Time Calculation (min) 44 min    Activity Tolerance Patient tolerated treatment well    Behavior During Therapy [facility] for tasks assessed/performed                 Past Medical History:  Diagnosis Date   Arrhythmia    History of SVT with documented PVC'S and  PAC'S  12/08/12 Nuc stress test normal LV EF 74%  Event Monitor  12/01/12-01/03/13   Atrial flutter (HCC)    Celiac disease    treated by Dr. Kinnie Scales   GERD (gastroesophageal reflux disease)    Hypertension    Intervertebral disc stenosis of neural canal of cervical region    Irregular heart beat 11/30/2012   ECHO-EF 60-65%   Osteoporosis    PMR (polymyalgia rheumatica) (HCC)    Dr. Mallie Mussel; pt states she was diagnosed 10-15 years ago, not treated at this time or any issues that she is aware of.   Scoliosis    Scoliosis    Sleep apnea 10/02/11 [facility]   Sleep study AHI -total sleep 10.3/hr  64.0/ hr during REM sleep.RDI 22.8/hr during total sleep 64.0/hr during REM sleep The lowest O2 sat during Non-REM and REM sleep was 86% and 88% respectively. 04/08/12 CPAP/BIPAP titration study [facility]   Past Surgical History:  Procedure Laterality Date   ANAL RECTAL MANOMETRY N/A 09/16/2023   Procedure: ANO RECTAL MANOMETRY;  Surgeon: Willis Modena, MD;  Location: [facility] ENDOSCOPY;  Service: Gastroenterology;  Laterality: N/A;   APPENDECTOMY     ruptured at age 51 and had surgery   CARDIAC CATHETERIZATION  01/27/06   CARDIOVERSION N/A 01/08/2022   Procedure: CARDIOVERSION;  Surgeon:  Meriam Sprague, MD;  [facility] ENDOSCOPY;  Service: Cardiovascular;  Laterality: N/A;   cataract surgery  2015   Dr. Elmer Picker; March & April 2015   Patient Active Problem List   Diagnosis Date Noted   Dyspnea 05/30/2023   Pleural effusion 05/30/2023   Radon exposure 05/30/2023   Secondary hypercoagulable state (HCC) 12/12/2021   Hyponatremia 12/04/2021   Acute CHF (congestive heart failure) (HCC) 12/02/2021   Chronic diastolic heart failure (HCC) 12/02/2021   Atrial fibrillation with rapid ventricular response (HCC) 12/02/2021   Essential hypertension 12/02/2021   GERD without esophagitis 12/02/2021   Obstructive sleep apnea 12/02/2021   Interstitial lung disease (HCC) 12/02/2021   History of pulmonary embolism 12/02/2021   Pulmonary embolism (HCC) 11/06/2021   Bronchiectasis (HCC) 11/06/2021   Deviated septum 05/08/2020   Mass of subcutaneous tissue 05/02/2020   Vertigo 06/16/2019   Pulmonary hypertension, unspecified (HCC) 04/14/2019   Ischemic colitis (HCC) 04/14/2019   Migraine with aura and without status migrainosus, not intractable 11/08/2018   Degeneration of lumbar intervertebral disc 10/14/2018   Hoarseness of voice 03/04/2018   Abdominal pain 07/01/2017   Diverticulitis, colon    Metabolic acidosis, increased anion gap    Constipation 04/14/2017   Lumbar hernia 04/14/2017   Presbycusis of both  ears 01/10/2017   Tinnitus aurium, bilateral 01/10/2017   Gastroesophageal reflux disease 08/20/2016   Hemoptysis 08/20/2016   Obstructive sleep apnea of adult 08/20/2016   Rhinitis, chronic 08/20/2016   Throat pain in adult 08/20/2016   Fatigue 12/23/2015   Sciatica of right side 10/06/2015   History of migraine headaches 10/06/2015   Frequent PVCs 12/28/2013   Premature atrial contractions 12/28/2013   PSVT (paroxysmal supraventricular tachycardia) (HCC) 12/28/2013   Heart palpitations 07/13/2013   Sleep apnea 04/11/2013   Scoliosis 04/11/2013   Atypical  atrial flutter (HCC) 11/29/2012   Chest pain, atypical 11/29/2012   Fibromyalgia syndrome 11/29/2012   Chronic steroid use 11/29/2012    PCP: [name], MD  REFERRING PROVIDER: [name], MD  REFERRING DIAG: M41.9 (ICD-10-CM) - Scoliosis  Rationale for Evaluation and Treatment: Rehabilitation  THERAPY DIAG:  Abnormal posture  Other idiopathic scoliosis, thoracolumbar region  Cervicalgia  Pain in thoracic spine  Chronic bilateral low back pain without sciatica  Muscle weakness (generalized)  Muscle spasm of back  ONSET DATE: chronic, years of progressive pain from degenerative scoliosis  SUBJECTIVE:                                                                                                                                                                                           SUBJECTIVE STATEMENT: I saw my heart doctor.  I am confused about the results of my appointment.  I have missed being here.  It helps so much.  PERTINENT HISTORY:  Degenerative scoliosis, complex medical history including pulmonary HTN, frequent PVCs and a-flutter, history of cardioversion, osteoporosis Has been SOB with activity On waitlist to get into cardiologist sooner   PAIN:  PAIN:  Are you having pain? Yes NPRS scale: 6/10 Pain location: abdomen, left side of trunk, low back, intermittent bil hips/buttock/knee, Rt foot pains, neck Pain orientation: Other: see above   PAIN TYPE: aching, sharp, and tight Pain description: intermittent and constant  Aggravating factors: varies Relieving factors: unsure   PRECAUTIONS: Other: osteoporosis  RED FLAGS: None   WEIGHT BEARING RESTRICTIONS: No  FALLS:  Has patient fallen in last 6 months? No  LIVING ENVIRONMENT: Lives with: lives with their family and lives alone Lives in: House/apartment Stairs: No Has following equipment at home: has cane and walker but does not use  OCCUPATION: retired  PLOF:  Independent with basic ADLs, Independent with household mobility without device, Independent with community mobility without device, and Needs assistance with homemaking  PATIENT GOALS:  be able to eat, improve strength for bowel control, feel less pain and more energy to expand social outlets and  walking  NEXT MD VISIT: as needed  OBJECTIVE:  Note: Objective measures were completed at Evaluation unless otherwise noted.   The Patient-Specific Functional Scale  Initial:  I am going to ask you to identify up to 3 important activities that you are unable to do or are having difficulty with as a result of this problem.  Today are there any activities that you are unable to do or having difficulty with because of this?  (Patient shown scale and patient rated each activity)  Follow up: When you first came in you had difficulty performing these activities.  Today do you still have difficulty?    DIAGNOSTIC FINDINGS:    PATIENT SURVEYS:  PSEQ: 33/60 (<30 = low self-efficacy) Pain Catastrophizing Scale: 17 (>30 = clinically signif level of pain catastrophizing)  COGNITION: Overall cognitive status:  intact but high anxiety and depression      SENSATION: WFL  MUSCLE LENGTH: Adaptive shortening of Lt obliques, QL, lat secondary to rigid scoliosis  POSTURE: rounded shoulders, forward head, decreased lumbar lordosis, and increased thoracic kyphosis, severe degenerative scoliosis with Lt SB/Rt Rot, left ribcage in near contact with left ilium  PALPATION: Spasm present in bil upper traps Fascial and muscle mobility restriction along Lt side of trunk due to adaptive shortening from scoliosis: obliques, QL, hip flexors, lat  LUMBAR ROM:   AROM eval  Flexion full  Extension NT  Right lateral flexion 20%  Left lateral flexion 40%  Right rotation 40%  Left rotation 20%   (Blank rows = not tested)  LOWER EXTREMITY ROM:    WFL, flexion is full with good hamstring length   LOWER  EXTREMITY MMT:   Grossly 4/5 throughout, 4-/5 hip abduction, ER  FUNCTIONAL TESTS:    GAIT: Distance walked: within clinic Assistive device utilized: None Level of assistance: Modified independence Comments: short stride length  TREATMENT DATE:  12/18/23: Rt SL manual therapy - broadening and elongation of Lt obliques, QL, intercostals, paraspinals from cervical to lumbar, gluteals, hip flexors.  Gentle sacral distraction. Rt SL with Lt LE and UE long reach to elongate trunk, PT cued slow inhale through nose to expand Lt ribcage and hold x3-5 sec, then exhale - 10 reps Rt SL pillow squeeze between knees with glut squeeze - PT providing TC at SITS bones for posterior PF squeeze Supine core therex: sequential march to 90/90, 90/90 toe taps, scissors and bicycle Seated blue loop hip abd with glut squeeze 2x10 Seated in chair posterior bil upper quadrant stripping, passive pec stretch bil  12/04/23: Rt SL manual therapy - broadening and elongation of Lt obliques, QL, intercostals, paraspinals from cervical to lumbar, gluteals, hip flexors.  Gentle sacral distraction. Rt SL with Lt LE and UE long reach to elongate trunk, PT cued slow inhale through nose to expand Lt ribcage and hold x3-5 sec, then exhale - 10 reps Rt SL pillow squeeze between knees with glut squeeze - PT providing TC at SITS bones for posterior PF squeeze Seated blue loop hip abd with glut squeeze 2x10 Seated in chair posterior bil upper quadrant stripping, passive pec stretch bil  11/27/23: Rt SL manual therapy - broadening and elongation of Lt obliques, QL, intercostals, paraspinals from cervical to lumbar, gluteals, hip flexors.  Gentle sacral distraction. Seated in chair posterior bil upper quadrant stripping, passive pec stretch bil Pt education: review of HEP from PF therex long holds vs quick flicks, Pt with poor ability to isolate PF so discussed allowance of "helper muscles" from gluteal  squeeze  addition  11/13/23: Rt SL manual therapy - broadening Lt obliques, QL, intercostals, paraspinals from cervical to lumbar, gluteals, hip flexors.  Gentle sacral distraction. Supine therex: SKTC alt LE x10, DKTC with rocking x30", hamstring stretch 2x30", fig 4 stretch 2x30" bil, abdominal stabilization with LE bicycle x20" and scissors at 90 deg hip flexion x 20" Seated in chair posterior bil upper quadrant stripping, passive pec stretch bil                                                                                                                  PATIENT EDUCATION:  Education details: Pt education (see above) Person educated: Patient Education method: Explanation Education comprehension: verbalized understanding  HOME EXERCISE PROGRAM: Journaling to outline some activities to try  ASSESSMENT:  CLINICAL IMPRESSION: Pt admits she does not have the energy or motivation to do her therex.  She was able to participate in PT today and even advanced her core therex from supine today.  She hasn't had PT in 2 weeks and was tighter along Rt lateral and anterior trunk today on compressed side.  PT worked superficial to deep and paired manual therapy with stretching and breathwork to open up Rt side.  Pt reported signif relief of pain end of session.  She continues to benefit from skilled PT for mobility and to optimize digestive and respiratory systems by reducing compression around trunk.  OBJECTIVE IMPAIRMENTS: cardiopulmonary status limiting activity, decreased activity tolerance, decreased coordination, decreased endurance, decreased mobility, decreased ROM, decreased strength, hypomobility, increased fascial restrictions, increased muscle spasms, impaired flexibility, impaired tone, improper body mechanics, postural dysfunction, and pain.   ACTIVITY LIMITATIONS: carrying, lifting, bending, sitting, standing, squatting, sleeping, stairs, transfers, bed mobility, continence, bathing, dressing, and  locomotion level  PARTICIPATION LIMITATIONS: meal prep, cleaning, laundry, shopping, and community activity  PERSONAL FACTORS: Age, Behavior pattern, Time since onset of injury/illness/exacerbation, and 1-2 comorbidities: pulmonary HTN, a-flutter, osteoporosis, severe degenerative scoliosis  are also affecting patient's functional outcome.   REHAB POTENTIAL: Good  CLINICAL DECISION MAKING: Evolving/moderate complexity  EVALUATION COMPLEXITY: Moderate   GOALS: Goals reviewed with patient? Yes  SHORT TERM GOALS: Target date: 11/27/23  Pt will journal and discover at least 1 activity to return to that she enjoys Baseline: Goal status: met, watching movies with neighbors 3/7  2.  Pt will strive to eat consistent small meals throughout the day to initiate a bowel routine Baseline:  Goal status: ongoing, using Ensure most days of the week, 3/7  3.  Pt will report 20% less pain around abdomen and trunk to allow for comfort with small meals Baseline:  Goal status: ongoing 3/7    LONG TERM GOALS: Target date: 01/01/24  Pt will return to walking program at least 2x/week for multi-system health benefits. Baseline:  Goal status: INITIAL  2.  Pt will be ind with HEP for gluteal and pelvic floor strength to reduce incidence of bowel leakage. Baseline:  Goal status: ongoing 3/7  3.  Pt will improve score of PSEQ  to at least 40 to demo improved self-efficacy within chronic pain. Baseline: 33 Goal status: INITIAL  4.  Pt will establish nighttime routine including Curable app, medidation, breathing and stretching routine to improve sleep stretches. Baseline:  Goal status: ongoing, working on compliance 3/7  5.  Pt will report at least 50% less pain with daily tasks and eating secondary to reduced soft tissue compression surrounding scoliosis. Baseline:  Goal status: INITIAL    PLAN:  PT FREQUENCY: 1-2x/week  PT DURATION: 8 weeks  PLANNED INTERVENTIONS: 97110-Therapeutic  exercises, 97530- Therapeutic activity, [id number]- Neuromuscular re-education, 97535- Self Care, [id number]- Manual therapy, Dry Needling, Joint mobilization, Spinal mobilization, Cryotherapy, and Moist heat.  PLAN FOR NEXT SESSION: review pelvic floor therex from past PT, review journal if Pt brought, ambulation for endurance, manual therapy   Bhakti Labella, PT 12/18/23 11:58 AM

## 2023-12-23 ENCOUNTER — Telehealth: Payer: Self-pay | Admitting: Physician Assistant

## 2023-12-23 DIAGNOSIS — F320 Major depressive disorder, single episode, mild: Secondary | ICD-10-CM | POA: Diagnosis not present

## 2023-12-23 DIAGNOSIS — D72821 Monocytosis (symptomatic): Secondary | ICD-10-CM | POA: Diagnosis not present

## 2023-12-23 DIAGNOSIS — Z6820 Body mass index (BMI) 20.0-20.9, adult: Secondary | ICD-10-CM | POA: Diagnosis not present

## 2023-12-23 DIAGNOSIS — I4891 Unspecified atrial fibrillation: Secondary | ICD-10-CM | POA: Diagnosis not present

## 2023-12-23 DIAGNOSIS — G479 Sleep disorder, unspecified: Secondary | ICD-10-CM | POA: Diagnosis not present

## 2023-12-23 DIAGNOSIS — I10 Essential (primary) hypertension: Secondary | ICD-10-CM | POA: Diagnosis not present

## 2023-12-23 DIAGNOSIS — Z0000 Encounter for general adult medical examination without abnormal findings: Secondary | ICD-10-CM | POA: Diagnosis not present

## 2023-12-23 DIAGNOSIS — F419 Anxiety disorder, unspecified: Secondary | ICD-10-CM | POA: Diagnosis not present

## 2023-12-23 DIAGNOSIS — Z86711 Personal history of pulmonary embolism: Secondary | ICD-10-CM | POA: Diagnosis not present

## 2023-12-23 DIAGNOSIS — Z79899 Other long term (current) drug therapy: Secondary | ICD-10-CM | POA: Diagnosis not present

## 2023-12-23 DIAGNOSIS — I509 Heart failure, unspecified: Secondary | ICD-10-CM | POA: Diagnosis not present

## 2023-12-23 DIAGNOSIS — J849 Interstitial pulmonary disease, unspecified: Secondary | ICD-10-CM | POA: Diagnosis not present

## 2023-12-23 LAB — DIGOXIN LEVEL
Creatinine, POC: 13 mg/dL
EGFR: 42
Microalb Creat Ratio: 75.9
Microalbumin, Urine: 1.01

## 2023-12-23 NOTE — Telephone Encounter (Signed)
   Pt c/o of Chest Pain: STAT if active CP, including tightness, pressure, jaw pain, radiating pain to shoulder/upper arm/back, CP unrelieved by Nitro. Symptoms reported of SOB, nausea, vomiting, sweating.  1. Are you having CP right now? No    2. Are you experiencing any other symptoms (ex. SOB, nausea, vomiting, sweating)? SOB   3. Is your CP continuous or coming and going? Come and go - mainly happens in the mornings   4. Have you taken Nitroglycerin? No   5. How long have you been experiencing CP? 3-5 days    6. If NO CP at time of call then end call with telling Pt to call back or call 911 if Chest pain returns prior to return call from triage team.

## 2023-12-23 NOTE — Telephone Encounter (Signed)
 Spoke to patient stated she saw Micah Flesher PA recently and she ordered a digoxin level.Stated she had done today with [facility] PCP.Stated she continues to be sob and has noticed occasional chest pain.She wanted Lawanna Kobus to be aware.She refused a appointment.  Stated she wanted to see Dr.Kelly before he retires.Advised I will send message to Micah Flesher PA and Dr.Kelly's CMA.

## 2023-12-23 NOTE — Telephone Encounter (Signed)
 Called patient pt address concerns. Left message for call back.

## 2023-12-25 ENCOUNTER — Encounter: Payer: Self-pay | Admitting: Physical Therapy

## 2023-12-25 ENCOUNTER — Ambulatory Visit: Payer: Medicare Other | Admitting: Physical Therapy

## 2023-12-25 DIAGNOSIS — R293 Abnormal posture: Secondary | ICD-10-CM

## 2023-12-25 DIAGNOSIS — M4125 Other idiopathic scoliosis, thoracolumbar region: Secondary | ICD-10-CM | POA: Diagnosis not present

## 2023-12-25 DIAGNOSIS — M545 Low back pain, unspecified: Secondary | ICD-10-CM | POA: Diagnosis not present

## 2023-12-25 DIAGNOSIS — M542 Cervicalgia: Secondary | ICD-10-CM | POA: Diagnosis not present

## 2023-12-25 DIAGNOSIS — M546 Pain in thoracic spine: Secondary | ICD-10-CM

## 2023-12-25 DIAGNOSIS — G8929 Other chronic pain: Secondary | ICD-10-CM | POA: Diagnosis not present

## 2023-12-25 NOTE — Telephone Encounter (Signed)
 Returned call to pt she states that she needs me to call back she is on the way to the restroom. Will call again later.

## 2023-12-25 NOTE — Therapy (Signed)
 OUTPATIENT PHYSICAL THERAPY THORACOLUMBAR EVALUATION   Patient Name: Janet Nguyen MRN: [id number] DOB:Apr 08, 1932, 89 y.o., female Today's Date: 12/25/2023  END OF SESSION:  PT End of Session - 12/25/23 1018     Visit Number 6    Date for PT Re-Evaluation 01/01/24    Authorization Type Medicare part A/B - KX at visit 15    Progress Note Due on Visit 10    PT Start Time 1015    PT Stop Time 1058    PT Time Calculation (min) 43 min    Activity Tolerance Patient tolerated treatment well    Behavior During Therapy [facility] for tasks assessed/performed                  Past Medical History:  Diagnosis Date   Arrhythmia    History of SVT with documented PVC'S and  PAC'S  12/08/12 Nuc stress test normal LV EF 74%  Event Monitor  12/01/12-01/03/13   Atrial flutter (HCC)    Celiac disease    treated by Dr. Kinnie Scales   GERD (gastroesophageal reflux disease)    Hypertension    Intervertebral disc stenosis of neural canal of cervical region    Irregular heart beat 11/30/2012   ECHO-EF 60-65%   Osteoporosis    PMR (polymyalgia rheumatica) (HCC)    Dr. Mallie Mussel; pt states she was diagnosed 10-15 years ago, not treated at this time or any issues that she is aware of.   Scoliosis    Scoliosis    Sleep apnea 10/02/11 [facility]   Sleep study AHI -total sleep 10.3/hr  64.0/ hr during REM sleep.RDI 22.8/hr during total sleep 64.0/hr during REM sleep The lowest O2 sat during Non-REM and REM sleep was 86% and 88% respectively. 04/08/12 CPAP/BIPAP titration study [facility]   Past Surgical History:  Procedure Laterality Date   ANAL RECTAL MANOMETRY N/A 09/16/2023   Procedure: ANO RECTAL MANOMETRY;  Surgeon: Willis Modena, MD;  Location: [facility] ENDOSCOPY;  Service: Gastroenterology;  Laterality: N/A;   APPENDECTOMY     ruptured at age 39 and had surgery   CARDIAC CATHETERIZATION  01/27/06   CARDIOVERSION N/A 01/08/2022   Procedure: CARDIOVERSION;   Surgeon: Meriam Sprague, MD;  Location: [facility] ENDOSCOPY;  Service: Cardiovascular;  Laterality: N/A;   cataract surgery  2015   Dr. Elmer Picker; March & April 2015   Patient Active Problem List   Diagnosis Date Noted   Dyspnea 05/30/2023   Pleural effusion 05/30/2023   Radon exposure 05/30/2023   Secondary hypercoagulable state (HCC) 12/12/2021   Hyponatremia 12/04/2021   Acute CHF (congestive heart failure) (HCC) 12/02/2021   Chronic diastolic heart failure (HCC) 12/02/2021   Atrial fibrillation with rapid ventricular response (HCC) 12/02/2021   Essential hypertension 12/02/2021   GERD without esophagitis 12/02/2021   Obstructive sleep apnea 12/02/2021   Interstitial lung disease (HCC) 12/02/2021   History of pulmonary embolism 12/02/2021   Pulmonary embolism (HCC) 11/06/2021   Bronchiectasis (HCC) 11/06/2021   Deviated septum 05/08/2020   Mass of subcutaneous tissue 05/02/2020   Vertigo 06/16/2019   Pulmonary hypertension, unspecified (HCC) 04/14/2019   Ischemic colitis (HCC) 04/14/2019   Migraine with aura and without status migrainosus, not intractable 11/08/2018   Degeneration of lumbar intervertebral disc 10/14/2018   Hoarseness of voice 03/04/2018   Abdominal pain 07/01/2017   Diverticulitis, colon    Metabolic acidosis, increased anion gap    Constipation 04/14/2017   Lumbar hernia 04/14/2017   Presbycusis of  both ears 01/10/2017   Tinnitus aurium, bilateral 01/10/2017   Gastroesophageal reflux disease 08/20/2016   Hemoptysis 08/20/2016   Obstructive sleep apnea of adult 08/20/2016   Rhinitis, chronic 08/20/2016   Throat pain in adult 08/20/2016   Fatigue 12/23/2015   Sciatica of right side 10/06/2015   History of migraine headaches 10/06/2015   Frequent PVCs 12/28/2013   Premature atrial contractions 12/28/2013   PSVT (paroxysmal supraventricular tachycardia) (HCC) 12/28/2013   Heart palpitations 07/13/2013   Sleep apnea 04/11/2013   Scoliosis 04/11/2013    Atypical atrial flutter (HCC) 11/29/2012   Chest pain, atypical 11/29/2012   Fibromyalgia syndrome 11/29/2012   Chronic steroid use 11/29/2012    PCP: [name], MD  REFERRING PROVIDER: [name], MD  REFERRING DIAG: M41.9 (ICD-10-CM) - Scoliosis  Rationale for Evaluation and Treatment: Rehabilitation  THERAPY DIAG:  Abnormal posture  Other idiopathic scoliosis, thoracolumbar region  Cervicalgia  Pain in thoracic spine  ONSET DATE: chronic, years of progressive pain from degenerative scoliosis  SUBJECTIVE:                                                                                                                                                                                           SUBJECTIVE STATEMENT: I have a migraine today.    PERTINENT HISTORY:  Degenerative scoliosis, complex medical history including pulmonary HTN, frequent PVCs and a-flutter, history of cardioversion, osteoporosis Has been SOB with activity On waitlist to get into cardiologist sooner   PAIN:  PAIN:  Are you having pain? Yes NPRS scale: 7/10 Pain location: abdomen, left side of trunk, low back, intermittent bil hips/buttock/knee, Rt foot pains, neck Pain orientation: Other: see above   PAIN TYPE: aching, sharp, and tight Pain description: intermittent and constant  Aggravating factors: varies Relieving factors: unsure   PRECAUTIONS: Other: osteoporosis  RED FLAGS: None   WEIGHT BEARING RESTRICTIONS: No  FALLS:  Has patient fallen in last 6 months? No  LIVING ENVIRONMENT: Lives with: lives with their family and lives alone Lives in: House/apartment Stairs: No Has following equipment at home: has cane and walker but does not use  OCCUPATION: retired  PLOF: Independent with basic ADLs, Independent with household mobility without device, Independent with community mobility without device, and Needs assistance with homemaking  PATIENT GOALS:  be able to  eat, improve strength for bowel control, feel less pain and more energy to expand social outlets and walking  NEXT MD VISIT: as needed  OBJECTIVE:  Note: Objective measures were completed at Evaluation unless otherwise noted.   The Patient-Specific Functional Scale  Initial:  I am going to ask you  to identify up to 3 important activities that you are unable to do or are having difficulty with as a result of this problem.  Today are there any activities that you are unable to do or having difficulty with because of this?  (Patient shown scale and patient rated each activity)  Follow up: When you first came in you had difficulty performing these activities.  Today do you still have difficulty?    DIAGNOSTIC FINDINGS:    PATIENT SURVEYS:  PSEQ: 33/60 (<30 = low self-efficacy) Pain Catastrophizing Scale: 17 (>30 = clinically signif level of pain catastrophizing)  COGNITION: Overall cognitive status:  intact but high anxiety and depression      SENSATION: WFL  MUSCLE LENGTH: Adaptive shortening of Lt obliques, QL, lat secondary to rigid scoliosis  POSTURE: rounded shoulders, forward head, decreased lumbar lordosis, and increased thoracic kyphosis, severe degenerative scoliosis with Lt SB/Rt Rot, left ribcage in near contact with left ilium  PALPATION: Spasm present in bil upper traps Fascial and muscle mobility restriction along Lt side of trunk due to adaptive shortening from scoliosis: obliques, QL, hip flexors, lat  LUMBAR ROM:   AROM eval  Flexion full  Extension NT  Right lateral flexion 20%  Left lateral flexion 40%  Right rotation 40%  Left rotation 20%   (Blank rows = not tested)  LOWER EXTREMITY ROM:    WFL, flexion is full with good hamstring length   LOWER EXTREMITY MMT:   Grossly 4/5 throughout, 4-/5 hip abduction, ER  FUNCTIONAL TESTS:    GAIT: Distance walked: within clinic Assistive device utilized: None Level of assistance: Modified  independence Comments: short stride length  TREATMENT DATE:  12/25/23: Rt SL manual therapy - broadening and elongation of Lt obliques, QL, intercostals, paraspinals from cervical to lumbar, gluteals, hip flexors.  Gentle sacral distraction. Rt SL with Lt LE and UE long reach to elongate trunk, PT cued slow inhale through nose to expand Lt ribcage and hold x3-5 sec, then exhale - 10 reps Rt SL pillow squeeze between knees with glut squeeze - PT providing TC at SITS bones for posterior PF squeeze Supine core therex: sequential march to 90/90, 90/90 toe taps, scissors and bicycle Seated blue loop hip abd with glut squeeze 2x10 Seated in chair posterior bil upper quadrant stripping, passive pec stretch bil  12/18/23: Rt SL manual therapy - broadening and elongation of Lt obliques, QL, intercostals, paraspinals from cervical to lumbar, gluteals, hip flexors.  Gentle sacral distraction. Rt SL with Lt LE and UE long reach to elongate trunk, PT cued slow inhale through nose to expand Lt ribcage and hold x3-5 sec, then exhale - 10 reps Rt SL pillow squeeze between knees with glut squeeze - PT providing TC at SITS bones for posterior PF squeeze Supine core therex: sequential march to 90/90, 90/90 toe taps, scissors and bicycle Seated blue loop hip abd with glut squeeze 2x10 Seated in chair posterior bil upper quadrant stripping, passive pec stretch bil  12/04/23: Rt SL manual therapy - broadening and elongation of Lt obliques, QL, intercostals, paraspinals from cervical to lumbar, gluteals, hip flexors.  Gentle sacral distraction. Rt SL with Lt LE and UE long reach to elongate trunk, PT cued slow inhale through nose to expand Lt ribcage and hold x3-5 sec, then exhale - 10 reps Rt SL pillow squeeze between knees with glut squeeze - PT providing TC at SITS bones for posterior PF squeeze Seated blue loop hip abd with glut squeeze 2x10 Seated in  chair posterior bil upper quadrant stripping, passive pec  stretch bil                                                                                                                    PATIENT EDUCATION:  Education details: Pt education (see above) Person educated: Patient Education method: Explanation Education comprehension: verbalized understanding  HOME EXERCISE PROGRAM: Journaling to outline some activities to try Pelvic floor - select exercises from past HEP Supine LE stretches from past HEP  Access Code: [email or web address] URL: [email or web address] Date: 12/25/2023 Prepared by: Loistine Simas Deontay Ladnier  Exercises - Sidelying Diaphragmatic Breathing  - 1 x daily - 7 x weekly - 1 sets - 10 reps - Seated Hip Abduction with Resistance  - 1 x daily - 7 x weekly - 2 sets - 10 reps - Standing Row with Anchored Resistance  - 1 x daily - 7 x weekly - 2 sets - 10 reps - Standing Shoulder Extension with Resistance  - 1 x daily - 7 x weekly - 2 sets - 10 reps  ASSESSMENT:  CLINICAL IMPRESSION: Pt arrived with migraine.  She is working with MD on heart medication levels.  She requested a review of HEP and another print out to help her with compliance.  She benefited greatly from [facility] today for decompression and elongation which eliminated her migraine by end of session.   OBJECTIVE IMPAIRMENTS: cardiopulmonary status limiting activity, decreased activity tolerance, decreased coordination, decreased endurance, decreased mobility, decreased ROM, decreased strength, hypomobility, increased fascial restrictions, increased muscle spasms, impaired flexibility, impaired tone, improper body mechanics, postural dysfunction, and pain.   ACTIVITY LIMITATIONS: carrying, lifting, bending, sitting, standing, squatting, sleeping, stairs, transfers, bed mobility, continence, bathing, dressing, and locomotion level  PARTICIPATION LIMITATIONS: meal prep, cleaning, laundry, shopping, and community activity  PERSONAL FACTORS: Age, Behavior pattern, Time since onset  of injury/illness/exacerbation, and 1-2 comorbidities: pulmonary HTN, a-flutter, osteoporosis, severe degenerative scoliosis  are also affecting patient's functional outcome.   REHAB POTENTIAL: Good  CLINICAL DECISION MAKING: Evolving/moderate complexity  EVALUATION COMPLEXITY: Moderate   GOALS: Goals reviewed with patient? Yes  SHORT TERM GOALS: Target date: 11/27/23  Pt will journal and discover at least 1 activity to return to that she enjoys Baseline: Goal status: met, watching movies with neighbors 3/7  2.  Pt will strive to eat consistent small meals throughout the day to initiate a bowel routine Baseline:  Goal status: ongoing, using Ensure most days of the week, 3/7  3.  Pt will report 20% less pain around abdomen and trunk to allow for comfort with small meals Baseline:  Goal status: ongoing 3/7    LONG TERM GOALS: Target date: 01/01/24  Pt will return to walking program at least 2x/week for multi-system health benefits. Baseline:  Goal status: INITIAL  2.  Pt will be ind with HEP for gluteal and pelvic floor strength to reduce incidence of bowel leakage. Baseline:  Goal status: ongoing 3/7  3.  Pt will improve score of PSEQ to at least  40 to demo improved self-efficacy within chronic pain. Baseline: 33 Goal status: INITIAL  4.  Pt will establish nighttime routine including Cur[employer] app, medidation, breathing and stretching routine to improve sleep stretches. Baseline:  Goal status: ongoing, working on compliance 3/7  5.  Pt will report at least 50% less pain with daily tasks and eating secondary to reduced soft tissue compression surrounding scoliosis. Baseline:  Goal status: ongoing    PLAN:  PT FREQUENCY: 1-2x/week  PT DURATION: 8 weeks  PLANNED INTERVENTIONS: 97110-Therapeutic exercises, 97530- Therapeutic activity, [id number]- Neuromuscular re-education, 97535- Self Care, [id number]- Manual therapy, Dry Needling, Joint mobilization, Spinal mobilization,  Cryotherapy, and Moist heat.  PLAN FOR NEXT SESSION: review pelvic floor therex from past PT, review journal if Pt brought, ambulation for endurance, manual therapy   Angellica Maddison, PT 12/25/23 12:00 PM

## 2023-12-25 NOTE — Telephone Encounter (Signed)
 PT returning call, requesting cb

## 2023-12-28 NOTE — Telephone Encounter (Signed)
 Spoke to patient she stated she had a digoxin level done with PCP this past Friday 3/28.PCP to fax Dr.Kelly results.Stated she wanted Dr.Kelly to review and she wanted appointment to see [facility] before he retires.Advised I will send message to Dr.Kelly's CMA.

## 2023-12-28 NOTE — Telephone Encounter (Signed)
 Pt states she's returning a call from the nurse and would like a c/b. Please advise

## 2023-12-30 NOTE — Telephone Encounter (Signed)
 Called patient to address concerns. Left message for call back.

## 2024-01-01 ENCOUNTER — Ambulatory Visit: Payer: Medicare Other | Attending: Family Medicine | Admitting: Physical Therapy

## 2024-01-01 ENCOUNTER — Encounter: Payer: Self-pay | Admitting: Physical Therapy

## 2024-01-01 DIAGNOSIS — M545 Low back pain, unspecified: Secondary | ICD-10-CM | POA: Diagnosis not present

## 2024-01-01 DIAGNOSIS — M546 Pain in thoracic spine: Secondary | ICD-10-CM | POA: Insufficient documentation

## 2024-01-01 DIAGNOSIS — G8929 Other chronic pain: Secondary | ICD-10-CM | POA: Diagnosis not present

## 2024-01-01 DIAGNOSIS — M542 Cervicalgia: Secondary | ICD-10-CM | POA: Diagnosis not present

## 2024-01-01 DIAGNOSIS — R293 Abnormal posture: Secondary | ICD-10-CM | POA: Diagnosis not present

## 2024-01-01 DIAGNOSIS — M6281 Muscle weakness (generalized): Secondary | ICD-10-CM | POA: Insufficient documentation

## 2024-01-01 DIAGNOSIS — M4125 Other idiopathic scoliosis, thoracolumbar region: Secondary | ICD-10-CM | POA: Diagnosis not present

## 2024-01-01 DIAGNOSIS — M62830 Muscle spasm of back: Secondary | ICD-10-CM | POA: Insufficient documentation

## 2024-01-01 NOTE — Therapy (Signed)
 OUTPATIENT PHYSICAL THERAPY THORACOLUMBAR PROGRESS NOTE  Progress Note Reporting Period 11/06/23 to 01/01/24  See note below for Objective Data and Assessment of Progress/Goals.       Patient Name: Janet Nguyen MRN: [id number] DOB:[birth year], 89 y.o., female Today's Date: 01/01/2024  END OF SESSION:  PT End of Session - 01/01/24 1027     Visit Number 7    Date for PT Re-Evaluation 02/26/24    Authorization Type Medicare part A/B - KX at visit 15    Progress Note Due on Visit 17    PT Start Time 1027    PT Stop Time 1118    PT Time Calculation (min) 51 min    Activity Tolerance Patient tolerated treatment well    Behavior During Therapy [facility] for tasks assessed/performed                   Past Medical History:  Diagnosis Date   Arrhythmia    History of SVT with documented PVC'S and  PAC'S  12/08/12 Nuc stress test normal LV EF 74%  Event Monitor  12/01/12-01/03/13   Atrial flutter (HCC)    Celiac disease    treated by Dr. Kinnie Scales   GERD (gastroesophageal reflux disease)    Hypertension    Intervertebral disc stenosis of neural canal of cervical region    Irregular heart beat 11/30/2012   ECHO-EF 60-65%   Osteoporosis    PMR (polymyalgia rheumatica) (HCC)    Dr. Mallie Mussel; pt states she was diagnosed 10-15 years ago, not treated at this time or any issues that she is aware of.   Scoliosis    Scoliosis    Sleep apnea 10/02/11 [facility]   Sleep study AHI -total sleep 10.3/hr  64.0/ hr during REM sleep.RDI 22.8/hr during total sleep 64.0/hr during REM sleep The lowest O2 sat during Non-REM and REM sleep was 86% and 88% respectively. 04/08/12 CPAP/BIPAP titration study [facility]   Past Surgical History:  Procedure Laterality Date   ANAL RECTAL MANOMETRY N/A 09/16/2023   Procedure: ANO RECTAL MANOMETRY;  Surgeon: Willis Modena, MD;  Location: [facility];  Service: Gastroenterology;  Laterality: N/A;   APPENDECTOMY      ruptured at age 70 and had surgery   CARDIAC CATHETERIZATION  01/27/06   CARDIOVERSION N/A 01/08/2022   Procedure: CARDIOVERSION;  Surgeon: Meriam Sprague, MD;  Location: [facility] ENDOSCOPY;  Service: Cardiovascular;  Laterality: N/A;   cataract surgery  2015   Dr. Elmer Picker; March & April 2015   Patient Active Problem List   Diagnosis Date Noted   Dyspnea 05/30/2023   Pleural effusion 05/30/2023   Radon exposure 05/30/2023   Secondary hypercoagulable state (HCC) 12/12/2021   Hyponatremia 12/04/2021   Acute CHF (congestive heart failure) (HCC) 12/02/2021   Chronic diastolic heart failure (HCC) 12/02/2021   Atrial fibrillation with rapid ventricular response (HCC) 12/02/2021   Essential hypertension 12/02/2021   GERD without esophagitis 12/02/2021   Obstructive sleep apnea 12/02/2021   Interstitial lung disease (HCC) 12/02/2021   History of pulmonary embolism 12/02/2021   Pulmonary embolism (HCC) 11/06/2021   Bronchiectasis (HCC) 11/06/2021   Deviated septum 05/08/2020   Mass of subcutaneous tissue 05/02/2020   Vertigo 06/16/2019   Pulmonary hypertension, unspecified (HCC) 04/14/2019   Ischemic colitis (HCC) 04/14/2019   Migraine with aura and without status migrainosus, not intractable 11/08/2018   Degeneration of lumbar intervertebral disc 10/14/2018   Hoarseness of voice 03/04/2018   Abdominal pain 07/01/2017  Diverticulitis, colon    Metabolic acidosis, increased anion gap    Constipation 04/14/2017   Lumbar hernia 04/14/2017   Presbycusis of both ears 01/10/2017   Tinnitus aurium, bilateral 01/10/2017   Gastroesophageal reflux disease 08/20/2016   Hemoptysis 08/20/2016   Obstructive sleep apnea of adult 08/20/2016   Rhinitis, chronic 08/20/2016   Throat pain in adult 08/20/2016   Fatigue 12/23/2015   Sciatica of right side 10/06/2015   History of migraine headaches 10/06/2015   Frequent PVCs 12/28/2013   Premature atrial contractions 12/28/2013   PSVT (paroxysmal  supraventricular tachycardia) (HCC) 12/28/2013   Heart palpitations 07/13/2013   Sleep apnea 04/11/2013   Scoliosis 04/11/2013   Atypical atrial flutter (HCC) 11/29/2012   Chest pain, atypical 11/29/2012   Fibromyalgia syndrome 11/29/2012   Chronic steroid use 11/29/2012    PCP: [name], MD  REFERRING PROVIDER: [name], MD  REFERRING DIAG: M41.9 (ICD-10-CM) - Scoliosis  Rationale for Evaluation and Treatment: Rehabilitation  THERAPY DIAG:  Abnormal posture  Other idiopathic scoliosis, thoracolumbar region  Cervicalgia  Pain in thoracic spine  Chronic bilateral low back pain without sciatica  Muscle weakness (generalized)  Muscle spasm of back  ONSET DATE: chronic, years of progressive pain from degenerative scoliosis  SUBJECTIVE:                                                                                                                                                                                           SUBJECTIVE STATEMENT: I did my HEP right before coming today.  I am just so tired all the time. I haven't had any pelvic floor accidents in awhile but still wear protection just in case.   My neck is hurting more than my back today.  PERTINENT HISTORY:  Degenerative scoliosis, complex medical history including pulmonary HTN, frequent PVCs and a-flutter, history of cardioversion, osteoporosis Has been SOB with activity On waitlist to get into cardiologist sooner   PAIN:  PAIN:  Are you having pain? Yes NPRS scale: 6/10 Pain location: abdomen, left side of trunk, low back, intermittent bil hips/buttock/knee, Rt foot pains, neck Pain orientation: Other: see above   PAIN TYPE: aching, sharp, and tight Pain description: intermittent and constant  Aggravating factors: varies Relieving factors: unsure   PRECAUTIONS: Other: osteoporosis  RED FLAGS: None   WEIGHT BEARING RESTRICTIONS: No  FALLS:  Has patient fallen in last 6  months? No  LIVING ENVIRONMENT: Lives with: lives with their family and lives alone Lives in: House/apartment Stairs: No Has following equipment at home: has cane and walker but does not use  OCCUPATION: retired  PLOF: Independent with  basic ADLs, Independent with household mobility without device, Independent with community mobility without device, and Needs assistance with homemaking  PATIENT GOALS:  be able to eat, improve strength for bowel control, feel less pain and more energy to expand social outlets and walking  NEXT MD VISIT: as needed  OBJECTIVE:  Note: Objective measures were completed at Evaluation unless otherwise noted.      DIAGNOSTIC FINDINGS:    PATIENT SURVEYS:  PSEQ: 33/60 (<30 = low self-efficacy) Pain Catastrophizing Scale: 17 (>30 = clinically signif level of pain catastrophizing)  COGNITION: Overall cognitive status:  intact but high anxiety and depression      SENSATION: WFL  MUSCLE LENGTH: 4/4 See palpation for updates Eval: Adaptive shortening of Lt obliques, QL, lat secondary to rigid scoliosis  POSTURE: rounded shoulders, forward head, decreased lumbar lordosis, and increased thoracic kyphosis, severe degenerative scoliosis with Lt SB/Rt Rot, left ribcage in near contact with left ilium  PALPATION: 4/4:  Improving mobility of soft tissues along Lt side of trunk allowing reduced pressure on bowels, bladder, stomach to allow improved bowel movements, control of bowel and bladder Improving lateral costal expansion along Lt side of trunk for improved depth of inhale and stretching of intercostals  Eval:  Spasm present in bil upper traps Fascial and muscle mobility restriction along Lt side of trunk due to adaptive shortening from scoliosis: obliques, QL, hip flexors, lat  LUMBAR ROM:   AROM eval 01/01/24  Flexion full full  Extension NT NT  Right lateral flexion 20% 30%  Left lateral flexion 40% 50%  Right rotation 40% 50%  Left  rotation 20% 20%   (Blank rows = not tested)  LOWER EXTREMITY ROM:    WFL, flexion is full with good hamstring length   LOWER EXTREMITY MMT:   4/4: hip abd and ER 4/5 Eval: Grossly 4/5 throughout, 4-/5 hip abduction, ER  FUNCTIONAL TESTS:    GAIT: Distance walked: within clinic Assistive device utilized: None Level of assistance: Modified independence Comments: short stride length  TREATMENT DATE:  01/01/24: Rt SL manual therapy - broadening and elongation of Lt obliques, QL, intercostals, paraspinals from cervical to lumbar, gluteals, hip flexors.  Gentle sacral distraction. Supine bicycle, scissors, sequential march x10 each, supine DKTC with rocking side to side, supine bridge 10x5" holds, manual resistance hooklying bil hip ER and IR 5x5" holds alt each way Seated blue loop hip abd with glut squeeze 2x10 Seated in chair posterior bil upper quadrant stripping, passive pec stretch bil  12/25/23: Rt SL manual therapy - broadening and elongation of Lt obliques, QL, intercostals, paraspinals from cervical to lumbar, gluteals, hip flexors.  Gentle sacral distraction. Rt SL with Lt LE and UE long reach to elongate trunk, PT cued slow inhale through nose to expand Lt ribcage and hold x3-5 sec, then exhale - 10 reps Rt SL pillow squeeze between knees with glut squeeze - PT providing TC at SITS bones for posterior PF squeeze Supine core therex: sequential march to 90/90, 90/90 toe taps, scissors and bicycle Seated blue loop hip abd with glut squeeze 2x10 Seated in chair posterior bil upper quadrant stripping, passive pec stretch bil  12/18/23: Rt SL manual therapy - broadening and elongation of Lt obliques, QL, intercostals, paraspinals from cervical to lumbar, gluteals, hip flexors.  Gentle sacral distraction. Rt SL with Lt LE and UE long reach to elongate trunk, PT cued slow inhale through nose to expand Lt ribcage and hold x3-5 sec, then exhale - 10 reps Rt SL  pillow squeeze between  knees with glut squeeze - PT providing TC at SITS bones for posterior PF squeeze Supine core therex: sequential march to 90/90, 90/90 toe taps, scissors and bicycle Seated blue loop hip abd with glut squeeze 2x10 Seated in chair posterior bil upper quadrant stripping, passive pec stretch bil  12/04/23: Rt SL manual therapy - broadening and elongation of Lt obliques, QL, intercostals, paraspinals from cervical to lumbar, gluteals, hip flexors.  Gentle sacral distraction. Rt SL with Lt LE and UE long reach to elongate trunk, PT cued slow inhale through nose to expand Lt ribcage and hold x3-5 sec, then exhale - 10 reps Rt SL pillow squeeze between knees with glut squeeze - PT providing TC at SITS bones for posterior PF squeeze Seated blue loop hip abd with glut squeeze 2x10 Seated in chair posterior bil upper quadrant stripping, passive pec stretch bil                                                                                                                    PATIENT EDUCATION:  Education details: Pt education (see above) Person educated: Patient Education method: Explanation Education comprehension: verbalized understanding  HOME EXERCISE PROGRAM: Journaling to outline some activities to try Pelvic floor - select exercises from past HEP Supine LE stretches from past HEP  Access Code: [email or web address] URL: [email or web address] Date: 12/25/2023 Prepared by: Loistine Simas Ohanna Gassert  Exercises - Sidelying Diaphragmatic Breathing  - 1 x daily - 7 x weekly - 1 sets - 10 reps - Seated Hip Abduction with Resistance  - 1 x daily - 7 x weekly - 2 sets - 10 reps - Standing Row with Anchored Resistance  - 1 x daily - 7 x weekly - 2 sets - 10 reps - Standing Shoulder Extension with Resistance  - 1 x daily - 7 x weekly - 2 sets - 10 reps  ASSESSMENT:  CLINICAL IMPRESSION: Pt continues to feel down about pain, isolation, and challenges with medical field going online for access to charts.   She does have visits from her daughter monthly and has a neighbor couple that she has as both friends/companions and has a new arrangement with them as a paid driver to accompany her on errands as needed.  Pt demos some improvement in lateral costal expansion and mobility of Lt sided soft tissues to reduce pressure on stomach, bladder, bowels and lungs.  We are working on dynamic stretches, breathwork, pelvic floor activation with help from accessory muscles such as gluteals, and working on hip strength.  Pt continues to work on dietary intake, bowel regulation, bladder and bowel control, and postural strength both in sessions and with HEP.  She will likely regress without PT given the numerous benefits of manual therapy and guided exercise for multiple body systems affected by age, comorbidities and degenerative scoliosis.   OBJECTIVE IMPAIRMENTS: cardiopulmonary status limiting activity, decreased activity tolerance, decreased coordination, decreased endurance, decreased mobility, decreased ROM, decreased strength, hypomobility, increased fascial restrictions, increased muscle spasms, impaired flexibility,  impaired tone, improper body mechanics, postural dysfunction, and pain.   ACTIVITY LIMITATIONS: carrying, lifting, bending, sitting, standing, squatting, sleeping, stairs, transfers, bed mobility, continence, bathing, dressing, and locomotion level  PARTICIPATION LIMITATIONS: meal prep, cleaning, laundry, shopping, and community activity  PERSONAL FACTORS: Age, Behavior pattern, Time since onset of injury/illness/exacerbation, and 1-2 comorbidities: pulmonary HTN, a-flutter, osteoporosis, severe degenerative scoliosis  are also affecting patient's functional outcome.   REHAB POTENTIAL: Good  CLINICAL DECISION MAKING: Evolving/moderate complexity  EVALUATION COMPLEXITY: Moderate   GOALS: Goals reviewed with patient? Yes  SHORT TERM GOALS: Target date: 11/27/23  Pt will journal and discover at  least 1 activity to return to that she enjoys Baseline: Goal status: met, watching movies with neighbors 3/7  2.  Pt will strive to eat consistent small meals throughout the day to initiate a bowel routine Baseline:  Goal status: met 4/4, most days  3.  Pt will report 20% less pain around abdomen and trunk to allow for comfort with small meals Baseline:  Goal status: met for most days 4/4    LONG TERM GOALS: Target date: 01/01/24  Pt will return to walking program at least 2x/week for multi-system health benefits. Baseline:  Goal status: Pt does not have energy yet 4/4  2.  Pt will be ind with HEP for gluteal and pelvic floor strength to reduce incidence of bowel leakage. Baseline:  Goal status: ongoing 4/4, signif reduction in pelvic floor control during day  3.  Pt will improve score of PSEQ to at least 40 to demo improved self-efficacy within chronic pain. Baseline: 33 Goal status: ongoing  4.  Pt will establish nighttime routine including Cur[employer] app, medidation, breathing and stretching routine to improve sleep stretches. Baseline:  Goal status: ongoing,  4/4  5.  Pt will report at least 50% less pain with daily tasks and eating secondary to reduced soft tissue compression surrounding scoliosis. Baseline:  Goal status: ongoing 4/4    PLAN:  PT FREQUENCY: 1-2x/week  PT DURATION: 8 weeks  PLANNED INTERVENTIONS: 97110-Therapeutic exercises, 97530- Therapeutic activity, 97112- Neuromuscular re-education, 97535- Self Care, 10932- Manual therapy, Dry Needling, Joint mobilization, Spinal mobilization, Cryotherapy, and Moist heat.  PLAN FOR NEXT SESSION: review pelvic floor therex from past PT, review journal if Pt brought, ambulation for endurance, manual therapy   Sherion Dooly, PT 01/01/24 11:30 AM

## 2024-01-05 DIAGNOSIS — H1132 Conjunctival hemorrhage, left eye: Secondary | ICD-10-CM | POA: Diagnosis not present

## 2024-01-05 DIAGNOSIS — H40013 Open angle with borderline findings, low risk, bilateral: Secondary | ICD-10-CM | POA: Diagnosis not present

## 2024-01-08 ENCOUNTER — Ambulatory Visit: Payer: Medicare Other | Admitting: Physical Therapy

## 2024-01-08 ENCOUNTER — Encounter: Payer: Self-pay | Admitting: Physical Therapy

## 2024-01-08 DIAGNOSIS — M546 Pain in thoracic spine: Secondary | ICD-10-CM | POA: Diagnosis not present

## 2024-01-08 DIAGNOSIS — M62830 Muscle spasm of back: Secondary | ICD-10-CM

## 2024-01-08 DIAGNOSIS — M4125 Other idiopathic scoliosis, thoracolumbar region: Secondary | ICD-10-CM

## 2024-01-08 DIAGNOSIS — M545 Low back pain, unspecified: Secondary | ICD-10-CM

## 2024-01-08 DIAGNOSIS — M6281 Muscle weakness (generalized): Secondary | ICD-10-CM

## 2024-01-08 DIAGNOSIS — G8929 Other chronic pain: Secondary | ICD-10-CM | POA: Diagnosis not present

## 2024-01-08 DIAGNOSIS — R293 Abnormal posture: Secondary | ICD-10-CM | POA: Diagnosis not present

## 2024-01-08 DIAGNOSIS — M542 Cervicalgia: Secondary | ICD-10-CM | POA: Diagnosis not present

## 2024-01-08 NOTE — Therapy (Signed)
 OUTPATIENT PHYSICAL THERAPY THORACOLUMBAR TREATMENT NOTE        Patient Name: Janet Nguyen MRN: [id number] DOB:Jul 01, 1932, 89 y.o., female Today's Date: 01/08/2024  END OF SESSION:  PT End of Session - 01/08/24 1200     Visit Number 8    Date for PT Re-Evaluation 02/26/24    Authorization Type Medicare part A/B - KX at visit 15    Progress Note Due on Visit 17    PT Start Time 1108    PT Stop Time 1153    PT Time Calculation (min) 45 min    Activity Tolerance Patient tolerated treatment well    Behavior During Therapy [facility] for tasks assessed/performed                   Past Medical History:  Diagnosis Date   Arrhythmia    History of SVT with documented PVC'S and  PAC'S  12/08/12 Nuc stress test normal LV EF 74%  Event Monitor  12/01/12-01/03/13   Atrial flutter (HCC)    Celiac disease    treated by Dr. Kinnie Scales   GERD (gastroesophageal reflux disease)    Hypertension    Intervertebral disc stenosis of neural canal of cervical region    Irregular heart beat 11/30/2012   ECHO-EF 60-65%   Osteoporosis    PMR (polymyalgia rheumatica) (HCC)    Dr. Mallie Mussel; pt states she was diagnosed 10-15 years ago, not treated at this time or any issues that she is aware of.   Scoliosis    Scoliosis    Sleep apnea 10/02/11 [facility]   Sleep study AHI -total sleep 10.3/hr  64.0/ hr during REM sleep.RDI 22.8/hr during total sleep 64.0/hr during REM sleep The lowest O2 sat during Non-REM and REM sleep was 86% and 88% respectively. 04/08/12 CPAP/BIPAP titration study [facility]   Past Surgical History:  Procedure Laterality Date   ANAL RECTAL MANOMETRY N/A 09/16/2023   Procedure: ANO RECTAL MANOMETRY;  Surgeon: Willis Modena, MD;  Location: [facility];  Service: Gastroenterology;  Laterality: N/A;   APPENDECTOMY     ruptured at age 43 and had surgery   CARDIAC CATHETERIZATION  01/27/06   CARDIOVERSION N/A 01/08/2022   Procedure:  CARDIOVERSION;  Surgeon: Meriam Sprague, MD;  Location: [facility];  Service: Cardiovascular;  Laterality: N/A;   cataract surgery  2015   Dr. Elmer Picker; March & April 2015   Patient Active Problem List   Diagnosis Date Noted   Dyspnea 05/30/2023   Pleural effusion 05/30/2023   Radon exposure 05/30/2023   Secondary hypercoagulable state (HCC) 12/12/2021   Hyponatremia 12/04/2021   Acute CHF (congestive heart failure) (HCC) 12/02/2021   Chronic diastolic heart failure (HCC) 12/02/2021   Atrial fibrillation with rapid ventricular response (HCC) 12/02/2021   Essential hypertension 12/02/2021   GERD without esophagitis 12/02/2021   Obstructive sleep apnea 12/02/2021   Interstitial lung disease (HCC) 12/02/2021   History of pulmonary embolism 12/02/2021   Pulmonary embolism (HCC) 11/06/2021   Bronchiectasis (HCC) 11/06/2021   Deviated septum 05/08/2020   Mass of subcutaneous tissue 05/02/2020   Vertigo 06/16/2019   Pulmonary hypertension, unspecified (HCC) 04/14/2019   Ischemic colitis (HCC) 04/14/2019   Migraine with aura and without status migrainosus, not intractable 11/08/2018   Degeneration of lumbar intervertebral disc 10/14/2018   Hoarseness of voice 03/04/2018   Abdominal pain 07/01/2017   Diverticulitis, colon    Metabolic acidosis, increased anion gap    Constipation 04/14/2017  Lumbar hernia 04/14/2017   Presbycusis of both ears 01/10/2017   Tinnitus aurium, bilateral 01/10/2017   Gastroesophageal reflux disease 08/20/2016   Hemoptysis 08/20/2016   Obstructive sleep apnea of adult 08/20/2016   Rhinitis, chronic 08/20/2016   Throat pain in adult 08/20/2016   Fatigue 12/23/2015   Sciatica of right side 10/06/2015   History of migraine headaches 10/06/2015   Frequent PVCs 12/28/2013   Premature atrial contractions 12/28/2013   PSVT (paroxysmal supraventricular tachycardia) (HCC) 12/28/2013   Heart palpitations 07/13/2013   Sleep apnea 04/11/2013   Scoliosis  04/11/2013   Atypical atrial flutter (HCC) 11/29/2012   Chest pain, atypical 11/29/2012   Fibromyalgia syndrome 11/29/2012   Chronic steroid use 11/29/2012    PCP: [name], MD  REFERRING PROVIDER: [name], MD  REFERRING DIAG: M41.9 (ICD-10-CM) - Scoliosis  Rationale for Evaluation and Treatment: Rehabilitation  THERAPY DIAG:  Abnormal posture  Other idiopathic scoliosis, thoracolumbar region  Cervicalgia  Pain in thoracic spine  Chronic bilateral low back pain without sciatica  Muscle weakness (generalized)  Muscle spasm of back  ONSET DATE: chronic, years of progressive pain from degenerative scoliosis  SUBJECTIVE:                                                                                                                                                                                           SUBJECTIVE STATEMENT: My stomach is a bit upset b/c I didn't have a BM yesterday and took extra benefiber.  I did go this morning.  I have been looking forward to this appointment - the therapy always helps.  PERTINENT HISTORY:  Degenerative scoliosis, complex medical history including pulmonary HTN, frequent PVCs and a-flutter, history of cardioversion, osteoporosis Has been SOB with activity On waitlist to get into cardiologist sooner   PAIN:  PAIN:  Are you having pain? Yes NPRS scale: 6/10 Pain location: abdomen, left side of trunk, low back, intermittent bil hips/buttock/knee, Rt foot pains, neck Pain orientation: Other: see above   PAIN TYPE: aching, sharp, and tight Pain description: intermittent and constant  Aggravating factors: varies Relieving factors: unsure   PRECAUTIONS: Other: osteoporosis  RED FLAGS: None   WEIGHT BEARING RESTRICTIONS: No  FALLS:  Has patient fallen in last 6 months? No  LIVING ENVIRONMENT: Lives with: lives with their family and lives alone Lives in: House/apartment Stairs: No Has following equipment  at home: has cane and walker but does not use  OCCUPATION: retired  PLOF: Independent with basic ADLs, Independent with household mobility without device, Independent with community mobility without device, and Needs assistance with homemaking  PATIENT GOALS:  be  able to eat, improve strength for bowel control, feel less pain and more energy to expand social outlets and walking  NEXT MD VISIT: as needed  OBJECTIVE:  Note: Objective measures were completed at Evaluation unless otherwise noted.      DIAGNOSTIC FINDINGS:    PATIENT SURVEYS:  PSEQ: 33/60 (<30 = low self-efficacy) Pain Catastrophizing Scale: 17 (>30 = clinically signif level of pain catastrophizing)  COGNITION: Overall cognitive status:  intact but high anxiety and depression      SENSATION: WFL  MUSCLE LENGTH: 4/4 See palpation for updates Eval: Adaptive shortening of Lt obliques, QL, lat secondary to rigid scoliosis  POSTURE: rounded shoulders, forward head, decreased lumbar lordosis, and increased thoracic kyphosis, severe degenerative scoliosis with Lt SB/Rt Rot, left ribcage in near contact with left ilium  PALPATION: 4/4:  Improving mobility of soft tissues along Lt side of trunk allowing reduced pressure on bowels, bladder, stomach to allow improved bowel movements, control of bowel and bladder Improving lateral costal expansion along Lt side of trunk for improved depth of inhale and stretching of intercostals  Eval:  Spasm present in bil upper traps Fascial and muscle mobility restriction along Lt side of trunk due to adaptive shortening from scoliosis: obliques, QL, hip flexors, lat  LUMBAR ROM:   AROM eval 01/01/24  Flexion full full  Extension NT NT  Right lateral flexion 20% 30%  Left lateral flexion 40% 50%  Right rotation 40% 50%  Left rotation 20% 20%   (Blank rows = not tested)  LOWER EXTREMITY ROM:    WFL, flexion is full with good hamstring length   LOWER EXTREMITY MMT:   4/4:  hip abd and ER 4/5 Eval: Grossly 4/5 throughout, 4-/5 hip abduction, ER  FUNCTIONAL TESTS:    GAIT: Distance walked: within clinic Assistive device utilized: None Level of assistance: Modified independence Comments: short stride length  TREATMENT DATE:  01/08/24: Rt SL manual therapy - broadening and elongation of Lt obliques, QL, intercostals, paraspinals from cervical to lumbar, gluteals, hip flexors.  Gentle sacral distraction. Supine bicycle, scissors, sequential march x10 each, supine DKTC with rocking side to side, supine bridge 10x5" holds, manual resistance hooklying bil hip ER and IR 5x5" holds alt each way Seated blue loop hip abd with glut squeeze 2x10 Seated in chair posterior bil upper quadrant stripping, passive pec stretch bil  01/01/24: Rt SL manual therapy - broadening and elongation of Lt obliques, QL, intercostals, paraspinals from cervical to lumbar, gluteals, hip flexors.  Gentle sacral distraction. Supine bicycle, scissors, sequential march x10 each, supine DKTC with rocking side to side, supine bridge 10x5" holds, manual resistance hooklying bil hip ER and IR 5x5" holds alt each way Seated blue loop hip abd with glut squeeze 2x10 Seated in chair posterior bil upper quadrant stripping, passive pec stretch bil  12/25/23: Rt SL manual therapy - broadening and elongation of Lt obliques, QL, intercostals, paraspinals from cervical to lumbar, gluteals, hip flexors.  Gentle sacral distraction. Rt SL with Lt LE and UE long reach to elongate trunk, PT cued slow inhale through nose to expand Lt ribcage and hold x3-5 sec, then exhale - 10 reps Rt SL pillow squeeze between knees with glut squeeze - PT providing TC at SITS bones for posterior PF squeeze Supine core therex: sequential march to 90/90, 90/90 toe taps, scissors and bicycle Seated blue loop hip abd with glut squeeze 2x10 Seated in chair posterior bil upper quadrant stripping, passive pec stretch bil  PATIENT EDUCATION:  Education details: Pt education (see above) Person educated: Patient Education method: Explanation Education comprehension: verbalized understanding  HOME EXERCISE PROGRAM: Journaling to outline some activities to try Pelvic floor - select exercises from past HEP Supine LE stretches from past HEP  Access Code: [email or web address] URL: [email or web address] Date: 12/25/2023 Prepared by: Loistine Simas Cynda Soule  Exercises - Sidelying Diaphragmatic Breathing  - 1 x daily - 7 x weekly - 1 sets - 10 reps - Seated Hip Abduction with Resistance  - 1 x daily - 7 x weekly - 2 sets - 10 reps - Standing Row with Anchored Resistance  - 1 x daily - 7 x weekly - 2 sets - 10 reps - Standing Shoulder Extension with Resistance  - 1 x daily - 7 x weekly - 2 sets - 10 reps  ASSESSMENT:  CLINICAL IMPRESSION: Pt reports she has improved days following PT sessions but feels she needs the sessions again by the time a week goes by.  She had some stomach upset related to constipation but was able to use abdominal massage she learned in PT along with benefiber to relieve her bowels this morning.  She continues to benefit from manual therapy to address severe compression of abdominal contents and spine related to severe degenerative scoliosis.  OBJECTIVE IMPAIRMENTS: cardiopulmonary status limiting activity, decreased activity tolerance, decreased coordination, decreased endurance, decreased mobility, decreased ROM, decreased strength, hypomobility, increased fascial restrictions, increased muscle spasms, impaired flexibility, impaired tone, improper body mechanics, postural dysfunction, and pain.   ACTIVITY LIMITATIONS: carrying, lifting, bending, sitting, standing, squatting, sleeping, stairs, transfers, bed mobility, continence, bathing, dressing, and locomotion level  PARTICIPATION LIMITATIONS: meal prep,  cleaning, laundry, shopping, and community activity  PERSONAL FACTORS: Age, Behavior pattern, Time since onset of injury/illness/exacerbation, and 1-2 comorbidities: pulmonary HTN, a-flutter, osteoporosis, severe degenerative scoliosis  are also affecting patient's functional outcome.   REHAB POTENTIAL: Good  CLINICAL DECISION MAKING: Evolving/moderate complexity  EVALUATION COMPLEXITY: Moderate   GOALS: Goals reviewed with patient? Yes  SHORT TERM GOALS: Target date: 11/27/23  Pt will journal and discover at least 1 activity to return to that she enjoys Baseline: Goal status: met, watching movies with neighbors 3/7  2.  Pt will strive to eat consistent small meals throughout the day to initiate a bowel routine Baseline:  Goal status: met 4/4, most days  3.  Pt will report 20% less pain around abdomen and trunk to allow for comfort with small meals Baseline:  Goal status: met for most days 4/4    LONG TERM GOALS: Target date: 01/01/24  Pt will return to walking program at least 2x/week for multi-system health benefits. Baseline:  Goal status: Pt does not have energy yet 4/4  2.  Pt will be ind with HEP for gluteal and pelvic floor strength to reduce incidence of bowel leakage. Baseline:  Goal status: ongoing 4/4, signif reduction in pelvic floor control during day  3.  Pt will improve score of PSEQ to at least 40 to demo improved self-efficacy within chronic pain. Baseline: 33 Goal status: ongoing  4.  Pt will establish nighttime routine including Curable app, medidation, breathing and stretching routine to improve sleep stretches. Baseline:  Goal status: ongoing,  4/4  5.  Pt will report at least 50% less pain with daily tasks and eating secondary to reduced soft tissue compression surrounding scoliosis. Baseline:  Goal status: ongoing 4/4    PLAN:  PT FREQUENCY: 1-2x/week  PT DURATION: 8 weeks  PLANNED INTERVENTIONS: 97110-Therapeutic exercises, 97530-  Therapeutic activity, [id number]- Neuromuscular re-education, 97535- Self Care, [id number]- Manual therapy, Dry Needling, Joint mobilization, Spinal mobilization, Cryotherapy, and Moist heat.  PLAN FOR NEXT SESSION: review pelvic floor therex from past PT, review journal if Pt brought, ambulation for endurance, manual therapy  Roshell Brigham, PT 01/08/24 12:05 PM

## 2024-01-21 DIAGNOSIS — R944 Abnormal results of kidney function studies: Secondary | ICD-10-CM | POA: Diagnosis not present

## 2024-01-22 ENCOUNTER — Ambulatory Visit: Payer: Medicare Other | Admitting: Physical Therapy

## 2024-01-22 ENCOUNTER — Encounter: Payer: Self-pay | Admitting: Physical Therapy

## 2024-01-22 DIAGNOSIS — M4125 Other idiopathic scoliosis, thoracolumbar region: Secondary | ICD-10-CM

## 2024-01-22 DIAGNOSIS — M542 Cervicalgia: Secondary | ICD-10-CM

## 2024-01-22 DIAGNOSIS — M62830 Muscle spasm of back: Secondary | ICD-10-CM

## 2024-01-22 DIAGNOSIS — G8929 Other chronic pain: Secondary | ICD-10-CM | POA: Diagnosis not present

## 2024-01-22 DIAGNOSIS — M545 Low back pain, unspecified: Secondary | ICD-10-CM | POA: Diagnosis not present

## 2024-01-22 DIAGNOSIS — M546 Pain in thoracic spine: Secondary | ICD-10-CM | POA: Diagnosis not present

## 2024-01-22 DIAGNOSIS — R293 Abnormal posture: Secondary | ICD-10-CM

## 2024-01-22 DIAGNOSIS — M6281 Muscle weakness (generalized): Secondary | ICD-10-CM

## 2024-01-22 NOTE — Therapy (Signed)
 OUTPATIENT PHYSICAL THERAPY THORACOLUMBAR TREATMENT NOTE        Patient Name: Janet Nguyen MRN: [id number] DOB:[birth year], 89 y.o., female Today's Date: 01/22/2024  END OF SESSION:  PT End of Session - 01/22/24 1109     Visit Number 9    Date for PT Re-Evaluation 02/26/24    Authorization Type Medicare part A/B - KX at visit 15    Progress Note Due on Visit 17    PT Start Time 1103    PT Stop Time 1150    PT Time Calculation (min) 47 min    Activity Tolerance Patient tolerated treatment well    Behavior During Therapy [facility] for tasks assessed/performed                    Past Medical History:  Diagnosis Date   Arrhythmia    History of SVT with documented PVC'S and  PAC'S  12/08/12 Nuc stress test normal LV EF 74%  Event Monitor  12/01/12-01/03/13   Atrial flutter (HCC)    Celiac disease    treated by Dr. 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 Chaim Colony; pt states she was diagnosed 10-15 years ago, not treated at this time or any issues that she is aware of.   Scoliosis    Scoliosis    Sleep apnea 10/02/11 [facility]   Sleep study AHI -total sleep 10.3/hr  64.0/ hr during REM sleep.RDI 22.8/hr during total sleep 64.0/hr during REM sleep The lowest O2 sat during Non-REM and REM sleep was 86% and 88% respectively. 04/08/12 CPAP/BIPAP titration study [facility]   Past Surgical History:  Procedure Laterality Date   ANAL RECTAL MANOMETRY N/A 09/16/2023   Procedure: ANO RECTAL MANOMETRY;  Surgeon: Evangeline Hilts, MD;  Location: [facility]OSCOPY;  Service: Gastroenterology;  Laterality: N/A;   APPENDECTOMY     ruptured at age 28 and had surgery   CARDIAC CATHETERIZATION  01/27/06   CARDIOVERSION N/A 01/08/2022   Procedure:  CARDIOVERSION;  Surgeon: Sonny Dust, MD;  Location: [facility] ENDOSCOPY;  Service: Cardiovascular;  Laterality: N/A;   cataract surgery  2015   Dr. Lasandra Points; March & April 2015   Patient Active Problem List   Diagnosis Date Noted   Dyspnea 05/30/2023   Pleural effusion 05/30/2023   Radon exposure 05/30/2023   Secondary hypercoagulable state (HCC) 12/12/2021   Hyponatremia 12/04/2021   Acute CHF (congestive heart failure) (HCC) 12/02/2021   Chronic diastolic heart failure (HCC) 12/02/2021   Atrial fibrillation with rapid ventricular response (HCC) 12/02/2021   Essential hypertension 12/02/2021   GERD without esophagitis 12/02/2021   Obstructive sleep apnea 12/02/2021   Interstitial lung disease (HCC) 12/02/2021   History of pulmonary embolism 12/02/2021   Pulmonary embolism (HCC) 11/06/2021   Bronchiectasis (HCC) 11/06/2021   Deviated septum 05/08/2020   Mass of subcutaneous tissue 05/02/2020   Vertigo 06/16/2019   Pulmonary hypertension, unspecified (HCC) 04/14/2019   Ischemic colitis (HCC) 04/14/2019   Migraine with aura and without status migrainosus, not intractable 11/08/2018   Degeneration of lumbar intervertebral disc 10/14/2018   Hoarseness of voice 03/04/2018   Abdominal pain 07/01/2017   Diverticulitis, colon    Metabolic acidosis, increased anion gap    Constipation 04/14/2017  Lumbar hernia 04/14/2017   Presbycusis of both ears 01/10/2017   Tinnitus aurium, bilateral 01/10/2017   Gastroesophageal reflux disease 08/20/2016   Hemoptysis 08/20/2016   Obstructive sleep apnea of adult 08/20/2016   Rhinitis, chronic 08/20/2016   Throat pain in adult 08/20/2016   Fatigue 12/23/2015   Sciatica of right side 10/06/2015   History of migraine headaches 10/06/2015   Frequent PVCs 12/28/2013   Premature atrial contractions 12/28/2013   PSVT (paroxysmal supraventricular tachycardia) (HCC) 12/28/2013   Heart palpitations 07/13/2013   Sleep apnea 04/11/2013   Scoliosis  04/11/2013   Atypical atrial flutter (HCC) 11/29/2012   Chest pain, atypical 11/29/2012   Fibromyalgia syndrome 11/29/2012   Chronic steroid use 11/29/2012    PCP: Jimmey Mould, MD  REFERRING PROVIDER: Jimmey Mould, MD  REFERRING DIAG: M41.9 (ICD-10-CM) - Scoliosis  Rationale for Evaluation and Treatment: Rehabilitation  THERAPY DIAG:  Abnormal posture  Cervicalgia  Other idiopathic scoliosis, thoracolumbar region  Pain in thoracic spine  Chronic bilateral low back pain without sciatica  Muscle weakness (generalized)  Muscle spasm of back  ONSET DATE: chronic, years of progressive pain from degenerative scoliosis  SUBJECTIVE:                                                                                                                                                                                           SUBJECTIVE STATEMENT: It was hard going two weeks without PT.  My back has been really hurting.  PERTINENT HISTORY:  Degenerative scoliosis, complex medical history including pulmonary HTN, frequent PVCs and a-flutter, history of cardioversion, osteoporosis Has been SOB with activity On waitlist to get into cardiologist sooner   PAIN:  PAIN:  Are you having pain? Yes NPRS scale: 6/10 Pain location: abdomen, left side of trunk, low back, intermittent bil hips/buttock/knee, Rt foot pains, neck Pain orientation: Other: see above   PAIN TYPE: aching, sharp, and tight Pain description: intermittent and constant  Aggravating factors: varies Relieving factors: unsure   PRECAUTIONS: Other: osteoporosis  RED FLAGS: None   WEIGHT BEARING RESTRICTIONS: No  FALLS:  Has patient fallen in last 6 months? No  LIVING ENVIRONMENT: Lives with: lives with their family and lives alone Lives in: House/apartment Stairs: No Has following equipment at home: has cane and walker but does not use  OCCUPATION: retired  PLOF: Independent with basic ADLs,  Independent with household mobility without device, Independent with community mobility without device, and Needs assistance with homemaking  PATIENT GOALS:  be able to eat, improve strength for bowel control, feel less pain and more energy to expand social outlets and walking  NEXT  MD VISIT: as needed  OBJECTIVE:  Note: Objective measures were completed at Evaluation unless otherwise noted.      DIAGNOSTIC FINDINGS:    PATIENT SURVEYS:  PSEQ: 33/60 (<30 = low self-efficacy) Pain Catastrophizing Scale: 17 (>30 = clinically signif level of pain catastrophizing)  COGNITION: Overall cognitive status:  intact but high anxiety and depression      SENSATION: WFL  MUSCLE LENGTH: 4/4 See palpation for updates Eval: Adaptive shortening of Lt obliques, QL, lat secondary to rigid scoliosis  POSTURE: rounded shoulders, forward head, decreased lumbar lordosis, and increased thoracic kyphosis, severe degenerative scoliosis with Lt SB/Rt Rot, left ribcage in near contact with left ilium  PALPATION: 4/4:  Improving mobility of soft tissues along Lt side of trunk allowing reduced pressure on bowels, bladder, stomach to allow improved bowel movements, control of bowel and bladder Improving lateral costal expansion along Lt side of trunk for improved depth of inhale and stretching of intercostals  Eval:  Spasm present in bil upper traps Fascial and muscle mobility restriction along Lt side of trunk due to adaptive shortening from scoliosis: obliques, QL, hip flexors, lat  LUMBAR ROM:   AROM eval 01/01/24  Flexion full full  Extension NT NT  Right lateral flexion 20% 30%  Left lateral flexion 40% 50%  Right rotation 40% 50%  Left rotation 20% 20%   (Blank rows = not tested)  LOWER EXTREMITY ROM:    WFL, flexion is full with good hamstring length   LOWER EXTREMITY MMT:   4/4: hip abd and ER 4/5 Eval: Grossly 4/5 throughout, 4-/5 hip abduction, ER  FUNCTIONAL TESTS:     GAIT: Distance walked: within clinic Assistive device utilized: None Level of assistance: Modified independence Comments: short stride length  TREATMENT DATE:  01/22/24: Rt SL manual therapy - broadening and elongation of Lt obliques, QL, intercostals, paraspinals from cervical to lumbar, gluteals, hip flexors.  Gentle sacral distraction. Supine bicycle, scissors, sequential march x10 each, supine DKTC with rocking side to side, supine bridge 10x5" holds, manual resistance hooklying bil hip ER and IR 5x5" holds alt each way Seated in chair posterior bil upper quadrant stripping, passive pec stretch bil  01/08/24: Rt SL manual therapy - broadening and elongation of Lt obliques, QL, intercostals, paraspinals from cervical to lumbar, gluteals, hip flexors.  Gentle sacral distraction. Supine bicycle, scissors, sequential march x10 each, supine DKTC with rocking side to side, supine bridge 10x5" holds, manual resistance hooklying bil hip ER and IR 5x5" holds alt each way Seated blue loop hip abd with glut squeeze 2x10 Seated in chair posterior bil upper quadrant stripping, passive pec stretch bil  01/01/24: Rt SL manual therapy - broadening and elongation of Lt obliques, QL, intercostals, paraspinals from cervical to lumbar, gluteals, hip flexors.  Gentle sacral distraction. Supine bicycle, scissors, sequential march x10 each, supine DKTC with rocking side to side, supine bridge 10x5" holds, manual resistance hooklying bil hip ER and IR 5x5" holds alt each way Seated blue loop hip abd with glut squeeze 2x10 Seated in chair posterior bil upper quadrant stripping, passive pec stretch bil  PATIENT EDUCATION:  Education details: Pt education (see above) Person educated: Patient Education method: Explanation Education comprehension: verbalized understanding  HOME EXERCISE PROGRAM: Journaling to outline  some activities to try Pelvic floor - select exercises from past HEP Supine LE stretches from past HEP  Access Code: [email or web address] URL: [email or web address] Date: 12/25/2023 Prepared by: Minor Amble Tesia Lybrand  Exercises - Sidelying Diaphragmatic Breathing  - 1 x daily - 7 x weekly - 1 sets - 10 reps - Seated Hip Abduction with Resistance  - 1 x daily - 7 x weekly - 2 sets - 10 reps - Standing Row with Anchored Resistance  - 1 x daily - 7 x weekly - 2 sets - 10 reps - Standing Shoulder Extension with Resistance  - 1 x daily - 7 x weekly - 2 sets - 10 reps  ASSESSMENT:  CLINICAL IMPRESSION: Pt hasn't had PT for two weeks and reports she really feels her pain increase when she isn't here weekly.   She continues to benefit from manual therapy to address severe compression of abdominal contents and spine related to severe degenerative scoliosis.  She has been doing some light garden work but mostly relies on others to help her at this time.  OBJECTIVE IMPAIRMENTS: cardiopulmonary status limiting activity, decreased activity tolerance, decreased coordination, decreased endurance, decreased mobility, decreased ROM, decreased strength, hypomobility, increased fascial restrictions, increased muscle spasms, impaired flexibility, impaired tone, improper body mechanics, postural dysfunction, and pain.   ACTIVITY LIMITATIONS: carrying, lifting, bending, sitting, standing, squatting, sleeping, stairs, transfers, bed mobility, continence, bathing, dressing, and locomotion level  PARTICIPATION LIMITATIONS: meal prep, cleaning, laundry, shopping, and community activity  PERSONAL FACTORS: Age, Behavior pattern, Time since onset of injury/illness/exacerbation, and 1-2 comorbidities: pulmonary HTN, a-flutter, osteoporosis, severe degenerative scoliosis  are also affecting patient's functional outcome.   REHAB POTENTIAL: Good  CLINICAL DECISION MAKING: Evolving/moderate complexity  EVALUATION  COMPLEXITY: Moderate   GOALS: Goals reviewed with patient? Yes  SHORT TERM GOALS: Target date: 11/27/23  Pt will journal and discover at least 1 activity to return to that she enjoys Baseline: Goal status: met, watching movies with neighbors 3/7  2.  Pt will strive to eat consistent small meals throughout the day to initiate a bowel routine Baseline:  Goal status: met 4/4, most days  3.  Pt will report 20% less pain around abdomen and trunk to allow for comfort with small meals Baseline:  Goal status: met for most days 4/4    LONG TERM GOALS: Target date: 01/01/24  Pt will return to walking program at least 2x/week for multi-system health benefits. Baseline:  Goal status: Pt does not have energy yet 4/4  2.  Pt will be ind with HEP for gluteal and pelvic floor strength to reduce incidence of bowel leakage. Baseline:  Goal status: ongoing 4/4, signif reduction in pelvic floor control during day  3.  Pt will improve score of PSEQ to at least 40 to demo improved self-efficacy within chronic pain. Baseline: 33 Goal status: ongoing  4.  Pt will establish nighttime routine including Curable app, medidation, breathing and stretching routine to improve sleep stretches. Baseline:  Goal status: ongoing,  4/4  5.  Pt will report at least 50% less pain with daily tasks and eating secondary to reduced soft tissue compression surrounding scoliosis. Baseline:  Goal status: ongoing 4/4    PLAN:  PT FREQUENCY: 1-2x/week  PT DURATION: 8 weeks  PLANNED INTERVENTIONS: 97110-Therapeutic exercises, 97530- Therapeutic activity, [id number]- Neuromuscular re-education, 97535- Self Care, [id number]- Manual therapy,  Dry Needling, Joint mobilization, Spinal mobilization, Cryotherapy, and Moist heat.  PLAN FOR NEXT SESSION: postural strength, ambulation for endurance, manual therapy Nary Sneed, PT 01/22/24 12:07 PM

## 2024-01-29 ENCOUNTER — Telehealth: Payer: Self-pay | Admitting: Internal Medicine

## 2024-01-29 ENCOUNTER — Ambulatory Visit: Payer: Medicare Other | Attending: Family Medicine | Admitting: Physical Therapy

## 2024-01-29 ENCOUNTER — Encounter: Payer: Self-pay | Admitting: Physical Therapy

## 2024-01-29 DIAGNOSIS — R293 Abnormal posture: Secondary | ICD-10-CM | POA: Insufficient documentation

## 2024-01-29 DIAGNOSIS — M6281 Muscle weakness (generalized): Secondary | ICD-10-CM | POA: Insufficient documentation

## 2024-01-29 DIAGNOSIS — M62830 Muscle spasm of back: Secondary | ICD-10-CM | POA: Diagnosis not present

## 2024-01-29 DIAGNOSIS — G8929 Other chronic pain: Secondary | ICD-10-CM | POA: Diagnosis not present

## 2024-01-29 DIAGNOSIS — M545 Low back pain, unspecified: Secondary | ICD-10-CM | POA: Insufficient documentation

## 2024-01-29 DIAGNOSIS — M4125 Other idiopathic scoliosis, thoracolumbar region: Secondary | ICD-10-CM | POA: Insufficient documentation

## 2024-01-29 DIAGNOSIS — M542 Cervicalgia: Secondary | ICD-10-CM | POA: Insufficient documentation

## 2024-01-29 DIAGNOSIS — R279 Unspecified lack of coordination: Secondary | ICD-10-CM | POA: Insufficient documentation

## 2024-01-29 DIAGNOSIS — M546 Pain in thoracic spine: Secondary | ICD-10-CM | POA: Diagnosis not present

## 2024-01-29 NOTE — Telephone Encounter (Signed)
 Patient called [facility] and notes a "Red Word", shortness of breath. Patient was upset with all the questions and hung up the phone. Due to it being a red word it needed to be documented. [facility] nurses advised agent to contact our office to move forward since patient is upset.

## 2024-01-29 NOTE — Therapy (Signed)
 OUTPATIENT PHYSICAL THERAPY THORACOLUMBAR TREATMENT NOTE        Patient Name: Janet Nguyen MRN: [id number] DOB:[birth year], 89 y.o., female Today's Date: 01/29/2024  END OF SESSION:  PT End of Session - 01/29/24 1102     Visit Number 10    Date for PT Re-Evaluation 02/26/24    Authorization Type Medicare part A/B - KX at visit 15    Progress Note Due on Visit 17    PT Start Time 1057    PT Stop Time 1145    PT Time Calculation (min) 48 min    Activity Tolerance Patient tolerated treatment well    Behavior During Therapy [facility] for tasks assessed/performed                     Past Medical History:  Diagnosis Date   Arrhythmia    History of SVT with documented PVC'S and  PAC'S  12/08/12 Nuc stress test normal LV EF 74%  Event Monitor  12/01/12-01/03/13   Atrial flutter (HCC)    Celiac disease    treated by Dr. 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 Chaim Colony; pt states she was diagnosed 10-15 years ago, not treated at this time or any issues that she is aware of.   Scoliosis    Scoliosis    Sleep apnea 10/02/11 [facility]   Sleep study AHI -total sleep 10.3/hr  64.0/ hr during REM sleep.RDI 22.8/hr during total sleep 64.0/hr during REM sleep The lowest O2 sat during Non-REM and REM sleep was 86% and 88% respectively. 04/08/12 CPAP/BIPAP titration study [facility]   Past Surgical History:  Procedure Laterality Date   ANAL RECTAL MANOMETRY N/A 09/16/2023   Procedure: ANO RECTAL MANOMETRY;  Surgeon: Evangeline Hilts, MD;  Location: [facility] ENDOSCOPY;  Service: Gastroenterology;  Laterality: N/A;   APPENDECTOMY     ruptured at age 54 and had surgery   CARDIAC CATHETERIZATION  01/27/06   CARDIOVERSION N/A 01/08/2022   Procedure:  CARDIOVERSION;  Surgeon: Sonny Dust, MD;  Location: [facility] ENDOSCOPY;  Service: Cardiovascular;  Laterality: N/A;   cataract surgery  2015   Dr. Lasandra Points; March & April 2015   Patient Active Problem List   Diagnosis Date Noted   Dyspnea 05/30/2023   Pleural effusion 05/30/2023   Radon exposure 05/30/2023   Secondary hypercoagulable state (HCC) 12/12/2021   Hyponatremia 12/04/2021   Acute CHF (congestive heart failure) (HCC) 12/02/2021   Chronic diastolic heart failure (HCC) 12/02/2021   Atrial fibrillation with rapid ventricular response (HCC) 12/02/2021   Essential hypertension 12/02/2021   GERD without esophagitis 12/02/2021   Obstructive sleep apnea 12/02/2021   Interstitial lung disease (HCC) 12/02/2021   History of pulmonary embolism 12/02/2021   Pulmonary embolism (HCC) 11/06/2021   Bronchiectasis (HCC) 11/06/2021   Deviated septum 05/08/2020   Mass of subcutaneous tissue 05/02/2020   Vertigo 06/16/2019   Pulmonary hypertension, unspecified (HCC) 04/14/2019   Ischemic colitis (HCC) 04/14/2019   Migraine with aura and without status migrainosus, not intractable 11/08/2018   Degeneration of lumbar intervertebral disc 10/14/2018   Hoarseness of voice 03/04/2018   Abdominal pain 07/01/2017   Diverticulitis, colon    Metabolic acidosis, increased anion gap    Constipation 04/14/2017  Lumbar hernia 04/14/2017   Presbycusis of both ears 01/10/2017   Tinnitus aurium, bilateral 01/10/2017   Gastroesophageal reflux disease 08/20/2016   Hemoptysis 08/20/2016   Obstructive sleep apnea of adult 08/20/2016   Rhinitis, chronic 08/20/2016   Throat pain in adult 08/20/2016   Fatigue 12/23/2015   Sciatica of right side 10/06/2015   History of migraine headaches 10/06/2015   Frequent PVCs 12/28/2013   Premature atrial contractions 12/28/2013   PSVT (paroxysmal supraventricular tachycardia) (HCC) 12/28/2013   Heart palpitations 07/13/2013   Sleep apnea 04/11/2013   Scoliosis  04/11/2013   Atypical atrial flutter (HCC) 11/29/2012   Chest pain, atypical 11/29/2012   Fibromyalgia syndrome 11/29/2012   Chronic steroid use 11/29/2012    PCP: Jimmey Mould, MD  REFERRING PROVIDER: Jimmey Mould, MD  REFERRING DIAG: M41.9 (ICD-10-CM) - Scoliosis  Rationale for Evaluation and Treatment: Rehabilitation  THERAPY DIAG:  Abnormal posture  Cervicalgia  Other idiopathic scoliosis, thoracolumbar region  Pain in thoracic spine  Chronic bilateral low back pain without sciatica  Muscle weakness (generalized)  Muscle spasm of back  ONSET DATE: chronic, years of progressive pain from degenerative scoliosis  SUBJECTIVE:                                                                                                                                                                                           SUBJECTIVE STATEMENT: I have been on/off nauseous this week so wasn't able to do as many exercises. These PT sessions help me so much.  PERTINENT HISTORY:  Degenerative scoliosis, complex medical history including pulmonary HTN, frequent PVCs and a-flutter, history of cardioversion, osteoporosis Has been SOB with activity On waitlist to get into cardiologist sooner   PAIN:  PAIN:  Are you having pain? Yes NPRS scale: 6/10 Pain location: abdomen, left side of trunk, low back, intermittent bil hips/buttock/knee, Rt foot pains, neck Pain orientation: Other: see above   PAIN TYPE: aching, sharp, and tight Pain description: intermittent and constant  Aggravating factors: varies Relieving factors: unsure   PRECAUTIONS: Other: osteoporosis  RED FLAGS: None   WEIGHT BEARING RESTRICTIONS: No  FALLS:  Has patient fallen in last 6 months? No  LIVING ENVIRONMENT: Lives with: lives with their family and lives alone Lives in: House/apartment Stairs: No Has following equipment at home: has cane and walker but does not use  OCCUPATION:  retired  PLOF: Independent with basic ADLs, Independent with household mobility without device, Independent with community mobility without device, and Needs assistance with homemaking  PATIENT GOALS:  be able to eat, improve strength for bowel control, feel less pain and more energy to  expand social outlets and walking  NEXT MD VISIT: as needed  OBJECTIVE:  Note: Objective measures were completed at Evaluation unless otherwise noted.  DIAGNOSTIC FINDINGS:    PATIENT SURVEYS:  PSEQ: 33/60 (<30 = low self-efficacy) Pain Catastrophizing Scale: 17 (>30 = clinically signif level of pain catastrophizing)  COGNITION: Overall cognitive status:  intact but high anxiety and depression      SENSATION: WFL  MUSCLE LENGTH: 4/4 See palpation for updates Eval: Adaptive shortening of Lt obliques, QL, lat secondary to rigid scoliosis  POSTURE: rounded shoulders, forward head, decreased lumbar lordosis, and increased thoracic kyphosis, severe degenerative scoliosis with Lt SB/Rt Rot, left ribcage in near contact with left ilium  PALPATION: 4/4:  Improving mobility of soft tissues along Lt side of trunk allowing reduced pressure on bowels, bladder, stomach to allow improved bowel movements, control of bowel and bladder Improving lateral costal expansion along Lt side of trunk for improved depth of inhale and stretching of intercostals  Eval:  Spasm present in bil upper traps Fascial and muscle mobility restriction along Lt side of trunk due to adaptive shortening from scoliosis: obliques, QL, hip flexors, lat  LUMBAR ROM:   AROM eval 01/01/24  Flexion full full  Extension NT NT  Right lateral flexion 20% 30%  Left lateral flexion 40% 50%  Right rotation 40% 50%  Left rotation 20% 20%   (Blank rows = not tested)  LOWER EXTREMITY ROM:    WFL, flexion is full with good hamstring length   LOWER EXTREMITY MMT:   4/4: hip abd and ER 4/5 Eval: Grossly 4/5 throughout, 4-/5 hip abduction,  ER  FUNCTIONAL TESTS:    GAIT: Distance walked: within clinic Assistive device utilized: None Level of assistance: Modified independence Comments: short stride length  TREATMENT DATE:  01/29/24 Rt SL manual therapy - broadening and elongation of Lt obliques, QL, intercostals, paraspinals from cervical to lumbar, gluteals, hip flexors.  Gentle sacral distraction. Rt SL "X" reach stretch of Lt arm/leg to open up short side of spine with slow, long lengthening STM with forearm by PT Supine fig 4 stretch 3x20" bil Supine DKTC rock back and forth x1' Supine scissor and bicycle x10 each, alt 2 rounds Seated in chair posterior bil upper quadrant stripping, passive pec stretch bil Red tband circuit x10 each: bil ER, shoulder row, shoulder ext, horiz abd - standing   01/22/24: Rt SL manual therapy - broadening and elongation of Lt obliques, QL, intercostals, paraspinals from cervical to lumbar, gluteals, hip flexors.  Gentle sacral distraction. Supine bicycle, scissors, sequential march x10 each, supine DKTC with rocking side to side, supine bridge 10x5" holds, manual resistance hooklying bil hip ER and IR 5x5" holds alt each way Seated in chair posterior bil upper quadrant stripping, passive pec stretch bil  01/08/24: Rt SL manual therapy - broadening and elongation of Lt obliques, QL, intercostals, paraspinals from cervical to lumbar, gluteals, hip flexors.  Gentle sacral distraction. Supine bicycle, scissors, sequential march x10 each, supine DKTC with rocking side to side, supine bridge 10x5" holds, manual resistance hooklying bil hip ER and IR 5x5" holds alt each way Seated blue loop hip abd with glut squeeze 2x10 Seated in chair posterior bil upper quadrant stripping, passive pec stretch bil  PATIENT EDUCATION:  Education details: Pt education (see above) Person educated: Patient Education method:  Explanation Education comprehension: verbalized understanding  HOME EXERCISE PROGRAM: Journaling to outline some activities to try Pelvic floor - select exercises from past HEP Supine LE stretches from past HEP  Access Code: [email or web address] URL: [email or web address] Date: 12/25/2023 Prepared by: Minor Amble Kamron Portee  Exercises - Sidelying Diaphragmatic Breathing  - 1 x daily - 7 x weekly - 1 sets - 10 reps - Seated Hip Abduction with Resistance  - 1 x daily - 7 x weekly - 2 sets - 10 reps - Standing Row with Anchored Resistance  - 1 x daily - 7 x weekly - 2 sets - 10 reps - Standing Shoulder Extension with Resistance  - 1 x daily - 7 x weekly - 2 sets - 10 reps  ASSESSMENT:  CLINICAL IMPRESSION: Pt had gotten away from HEP due to feeling under the weather last week.  We revisited some core, stretching and UE strength with red tband today and Pt displayed great form and tolerance.  She is typically very compliant with activity at home and commitment to HEP.  She continues to benefit from manual therapy to address severe compression from degenerative scoliosis.    OBJECTIVE IMPAIRMENTS: cardiopulmonary status limiting activity, decreased activity tolerance, decreased coordination, decreased endurance, decreased mobility, decreased ROM, decreased strength, hypomobility, increased fascial restrictions, increased muscle spasms, impaired flexibility, impaired tone, improper body mechanics, postural dysfunction, and pain.   ACTIVITY LIMITATIONS: carrying, lifting, bending, sitting, standing, squatting, sleeping, stairs, transfers, bed mobility, continence, bathing, dressing, and locomotion level  PARTICIPATION LIMITATIONS: meal prep, cleaning, laundry, shopping, and community activity  PERSONAL FACTORS: Age, Behavior pattern, Time since onset of injury/illness/exacerbation, and 1-2 comorbidities: pulmonary HTN, a-flutter, osteoporosis, severe degenerative scoliosis  are also affecting  patient's functional outcome.   REHAB POTENTIAL: Good  CLINICAL DECISION MAKING: Evolving/moderate complexity  EVALUATION COMPLEXITY: Moderate   GOALS: Goals reviewed with patient? Yes  SHORT TERM GOALS: Target date: 11/27/23  Pt will journal and discover at least 1 activity to return to that she enjoys Baseline: Goal status: met, watching movies with neighbors 3/7  2.  Pt will strive to eat consistent small meals throughout the day to initiate a bowel routine Baseline:  Goal status: met 4/4, most days  3.  Pt will report 20% less pain around abdomen and trunk to allow for comfort with small meals Baseline:  Goal status: met for most days 4/4    LONG TERM GOALS: Target date: 01/01/24  Pt will return to walking program at least 2x/week for multi-system health benefits. Baseline:  Goal status: Pt does not have energy yet 4/4  2.  Pt will be ind with HEP for gluteal and pelvic floor strength to reduce incidence of bowel leakage. Baseline:  Goal status: ongoing 4/4, signif reduction in pelvic floor control during day  3.  Pt will improve score of PSEQ to at least 40 to demo improved self-efficacy within chronic pain. Baseline: 33 Goal status: ongoing  4.  Pt will establish nighttime routine including Curable app, medidation, breathing and stretching routine to improve sleep stretches. Baseline:  Goal status: ongoing,  4/4  5.  Pt will report at least 50% less pain with daily tasks and eating secondary to reduced soft tissue compression surrounding scoliosis. Baseline:  Goal status: ongoing 4/4    PLAN:  PT FREQUENCY: 1-2x/week  PT DURATION: 8 weeks  PLANNED INTERVENTIONS: 97110-Therapeutic exercises, 97530- Therapeutic activity, [id number]- Neuromuscular re-education, 97535- Self Care, [id number]-  Manual therapy, Dry Needling, Joint mobilization, Spinal mobilization, Cryotherapy, and Moist heat.  PLAN FOR NEXT SESSION: postural strength, ambulation for endurance, manual  therapy Elizabelle Fite, PT 01/29/24 11:46 AM

## 2024-01-29 NOTE — Telephone Encounter (Signed)
 Lm for pt

## 2024-02-01 NOTE — Telephone Encounter (Signed)
 Pt called me back. Pt stated to give her 15 minutes then to call her back as she had an upset stomach.

## 2024-02-01 NOTE — Telephone Encounter (Signed)
 I called pt back. Pt states the problem is she saw Dr Washington Hacker back in March 2025. She was told someone would call her and make an appointment. Pt states she knows Washington Hacker is on Maternity leave and was concerned if she needed a new doctor. Pt states she likes Dr Washington Hacker and that [facility] was closer to her. Pt states she was told she could see [facility] on [address]. Pt states she was not told that Dr Washington Hacker was pregnant or left for maternity leave.   Pt is open to seeing Irby Mannan, NP for s.o.b. When I asked pt about her s.o.b, she states she has all kinds of symptoms and problems and she is not sure what is going on. Pt states she feels out of breath all the time. Pt states she was given samples of Spiriva  and albuterol by different doctors. Pt states she wants to speak to someone who would talk to her and give her answers about her lung problems. I will schedule pt for 02-23-24 with Beth.

## 2024-02-01 NOTE — Telephone Encounter (Signed)
 ATC X2. LMTCB

## 2024-02-05 ENCOUNTER — Ambulatory Visit: Payer: Medicare Other | Admitting: Physical Therapy

## 2024-02-05 ENCOUNTER — Telehealth: Payer: Self-pay | Admitting: Physical Therapy

## 2024-02-05 NOTE — Telephone Encounter (Signed)
 Pt was a no show for 11am appointment.  PT called and spoke to Pt who forgot.  She confirmed she will be here next week. Betrice Wanat, PT 02/05/24 11:24 AM

## 2024-02-08 ENCOUNTER — Ambulatory Visit (HOSPITAL_BASED_OUTPATIENT_CLINIC_OR_DEPARTMENT_OTHER): Admitting: Cardiology

## 2024-02-08 ENCOUNTER — Encounter (HOSPITAL_BASED_OUTPATIENT_CLINIC_OR_DEPARTMENT_OTHER): Payer: Self-pay | Admitting: Cardiology

## 2024-02-08 VITALS — BP 122/70 | HR 68 | Ht 60.0 in | Wt 106.2 lb

## 2024-02-08 DIAGNOSIS — I340 Nonrheumatic mitral (valve) insufficiency: Secondary | ICD-10-CM

## 2024-02-08 DIAGNOSIS — R600 Localized edema: Secondary | ICD-10-CM | POA: Diagnosis not present

## 2024-02-08 DIAGNOSIS — I493 Ventricular premature depolarization: Secondary | ICD-10-CM | POA: Diagnosis not present

## 2024-02-08 DIAGNOSIS — D6869 Other thrombophilia: Secondary | ICD-10-CM

## 2024-02-08 DIAGNOSIS — I4819 Other persistent atrial fibrillation: Secondary | ICD-10-CM | POA: Diagnosis not present

## 2024-02-08 DIAGNOSIS — I5032 Chronic diastolic (congestive) heart failure: Secondary | ICD-10-CM | POA: Diagnosis not present

## 2024-02-08 DIAGNOSIS — Z7901 Long term (current) use of anticoagulants: Secondary | ICD-10-CM | POA: Diagnosis not present

## 2024-02-08 DIAGNOSIS — I471 Supraventricular tachycardia, unspecified: Secondary | ICD-10-CM

## 2024-02-08 MED ORDER — APIXABAN 2.5 MG PO TABS: 2.5000 mg | ORAL_TABLET | Freq: Two times a day (BID) | ORAL | 0 refills | Status: DC

## 2024-02-08 NOTE — Patient Instructions (Signed)
 Medication Instructions:  Your physician recommends that you continue on your current medications as directed. Please refer to the Current Medication list given to you today.   Follow-Up: Please follow up in 3 months with Dr. Veryl Gottron, Slater Duncan, NP or Neomi Banks, NP

## 2024-02-08 NOTE — Progress Notes (Signed)
 Cardiology Office Note:  .   Date:  02/08/2024  ID:  [name], DOB Feb 02, 1932, MRN [id number] PCP: Jimmey Mould, MD  [facility] Providers Cardiologist:  Sheryle Donning, MD Cardiology APP:  Lamond Pilot, [place] {  History of Present Illness: .   Janet Nguyen is a 89 y.o. female with PMH SVT, PACs, PVCs, atrial fibrillation, PE 2023. She was previously followed by Dr. Loetta Ringer and established with me on 02/08/24.  Pertinent CV history: afib diagnosed 2023, Not a candidate for amiodarone given underlying lung disease. Was planned for tikosyn but cancelled admission. Currently rate controlled.   Today: Here with her daughter. Most recent ECG 11/10/23 was afib RVR. She is rate controlled today. Has a list of questions today, she isn't sure what is going on with her medically. Feels that she has been going downhill since Covid. Scoliosis complicates all of her medical issues, including GI and lungs.   Discussed afib at length today. Reviewed medications, management options.   Breathing is much better. Hasn't been walking much but feels like she could.   ROS: Denies chest pain, shortness of breath at rest or with normal exertion. No PND, orthopnea, LE edema or unexpected weight gain. No syncope or palpitations. ROS otherwise negative except as noted.   Studies Reviewed: Aaron Aas    EKG:       Physical Exam:   VS:  There were no vitals taken for this visit.   Wt Readings from Last 3 Encounters:  12/15/23 108 lb (49 kg)  12/09/23 107 lb 8 oz (48.8 kg)  11/10/23 108 lb (49 kg)    GEN: Well nourished, well developed in no acute distress HEENT: Normal, moist mucous membranes NECK: No JVD CARDIAC: irregularly irregular rhythm, normal S1 and S2, no rubs or gallops. 2/6 holosystolic murmur. VASCULAR: Radial and DP pulses 2+ bilaterally. No carotid bruits RESPIRATORY:  Clear to auscultation without rales, wheezing or rhonchi  ABDOMEN: Soft, non-tender,  non-distended MUSCULOSKELETAL:  Ambulates independently SKIN: Warm and dry, trivial bilateral LE edema, R foot >L  NEUROLOGIC:  Alert and oriented x 3. No focal neuro deficits noted. PSYCHIATRIC:  Normal affect    ASSESSMENT AND PLAN: .    Atrial fibrillation, persistent vs. permanent PVCs, history of PACs and pSVT -CHA2DS2/VAS Stroke Risk Points=7 -now treated as permanent, rate controlled -continue digoxin , diltiazem , and metoprolol  as currently ordered -continue apixaban  2.5 mg daily, dose reduced given age and weight. This is costly for her, discussed alternatives -she also has a history of PE as indication for anticoagulation  LE edema Moderate MR Elevated PA pressures Chronic diastolic heart failure -reviewed echo results today, discussed monitoring for fluid -follows with Dr. Bertrum Brodie, has questions re: her inhalers today -continue torsemide  20 mg in the morning and 20 mg in the afternoon.   Dispo: 3 mos with me or APP  Total time of encounter: I spent 40 minutes dedicated to the care of this patient on the date of this encounter to include pre-visit review of records, face-to-face time with the patient discussing conditions above, and clinical documentation with the electronic health record. We specifically spent time today discussing atrial fibrillation, how it is managed, reviewing her echo, discussion of etiologies of fluid/shortness of breath, medications and what they manage.   Signed, Sheryle Donning, MD   Sheryle Donning, MD, PhD, [facility]  [facility]  [place]  [facility] at [facility] at [facility] [address] [place],  [place] [address] [phone]

## 2024-02-12 ENCOUNTER — Ambulatory Visit: Payer: Self-pay | Admitting: Student

## 2024-02-12 ENCOUNTER — Encounter: Payer: Self-pay | Admitting: Physical Therapy

## 2024-02-12 ENCOUNTER — Ambulatory Visit: Payer: Medicare Other | Admitting: Physical Therapy

## 2024-02-12 DIAGNOSIS — M546 Pain in thoracic spine: Secondary | ICD-10-CM

## 2024-02-12 DIAGNOSIS — M545 Low back pain, unspecified: Secondary | ICD-10-CM | POA: Diagnosis not present

## 2024-02-12 DIAGNOSIS — M6281 Muscle weakness (generalized): Secondary | ICD-10-CM

## 2024-02-12 DIAGNOSIS — M4125 Other idiopathic scoliosis, thoracolumbar region: Secondary | ICD-10-CM | POA: Diagnosis not present

## 2024-02-12 DIAGNOSIS — M542 Cervicalgia: Secondary | ICD-10-CM

## 2024-02-12 DIAGNOSIS — M62830 Muscle spasm of back: Secondary | ICD-10-CM

## 2024-02-12 DIAGNOSIS — G8929 Other chronic pain: Secondary | ICD-10-CM

## 2024-02-12 DIAGNOSIS — R293 Abnormal posture: Secondary | ICD-10-CM

## 2024-02-12 NOTE — Therapy (Signed)
 OUTPATIENT PHYSICAL THERAPY THORACOLUMBAR TREATMENT NOTE        Patient Name: Janet Nguyen MRN: [id number] DOB:14-Nov-1931, 89 y.o., female Today's Date: 02/12/2024  END OF SESSION:  PT End of Session - 02/12/24 1154     Visit Number 11    Date for PT Re-Evaluation 02/26/24    Authorization Type Medicare part A/B - KX at visit 15    Progress Note Due on Visit 17    PT Start Time 1100    PT Stop Time 1145    PT Time Calculation (min) 45 min    Activity Tolerance Patient tolerated treatment well    Behavior During Therapy [facility] for tasks assessed/performed                      Past Medical History:  Diagnosis Date   Arrhythmia    History of SVT with documented PVC'S and  PAC'S  12/08/12 Nuc stress test normal LV EF 74%  Event Monitor  12/01/12-01/03/13   Atrial flutter (HCC)    Celiac disease    treated by Dr. 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 Chaim Colony; pt states she was diagnosed 10-15 years ago, not treated at this time or any issues that she is aware of.   Scoliosis    Scoliosis    Sleep apnea 10/02/11 [facility]   Sleep study AHI -total sleep 10.3/hr  64.0/ hr during REM sleep.RDI 22.8/hr during total sleep 64.0/hr during REM sleep The lowest O2 sat during Non-REM and REM sleep was 86% and 88% respectively. 04/08/12 CPAP/BIPAP titration study [facility]   Past Surgical History:  Procedure Laterality Date   ANAL RECTAL MANOMETRY N/A 09/16/2023   Procedure: ANO RECTAL MANOMETRY;  Surgeon: Evangeline Hilts, MD;  Location: [facility];  Service: Gastroenterology;  Laterality: N/A;   APPENDECTOMY     ruptured at age 68 and had surgery   CARDIAC CATHETERIZATION  01/27/06   CARDIOVERSION N/A 01/08/2022   Procedure:  CARDIOVERSION;  Surgeon: Sonny Dust, MD;  Location: [facility] ENDOSCOPY;  Service: Cardiovascular;  Laterality: N/A;   cataract surgery  2015   Dr. Lasandra Points; March & April 2015   Patient Active Problem List   Diagnosis Date Noted   Dyspnea 05/30/2023   Pleural effusion 05/30/2023   Radon exposure 05/30/2023   Secondary hypercoagulable state (HCC) 12/12/2021   Hyponatremia 12/04/2021   Acute CHF (congestive heart failure) (HCC) 12/02/2021   Chronic diastolic heart failure (HCC) 12/02/2021   Atrial fibrillation with rapid ventricular response (HCC) 12/02/2021   Essential hypertension 12/02/2021   GERD without esophagitis 12/02/2021   Obstructive sleep apnea 12/02/2021   Interstitial lung disease (HCC) 12/02/2021   History of pulmonary embolism 12/02/2021   Pulmonary embolism (HCC) 11/06/2021   Bronchiectasis (HCC) 11/06/2021   Deviated septum 05/08/2020   Mass of subcutaneous tissue 05/02/2020   Vertigo 06/16/2019   Pulmonary hypertension, unspecified (HCC) 04/14/2019   Ischemic colitis (HCC) 04/14/2019   Migraine with aura and without status migrainosus, not intractable 11/08/2018   Degeneration of lumbar intervertebral disc 10/14/2018   Hoarseness of voice 03/04/2018   Abdominal pain 07/01/2017   Diverticulitis, colon    Metabolic acidosis, increased anion gap    Constipation  04/14/2017   Lumbar hernia 04/14/2017   Presbycusis of both ears 01/10/2017   Tinnitus aurium, bilateral 01/10/2017   Gastroesophageal reflux disease 08/20/2016   Hemoptysis 08/20/2016   Obstructive sleep apnea of adult 08/20/2016   Rhinitis, chronic 08/20/2016   Throat pain in adult 08/20/2016   Fatigue 12/23/2015   Sciatica of right side 10/06/2015   History of migraine headaches 10/06/2015   Frequent PVCs 12/28/2013   Premature atrial contractions 12/28/2013   PSVT (paroxysmal supraventricular tachycardia) (HCC) 12/28/2013   Heart palpitations 07/13/2013   Sleep apnea 04/11/2013   Scoliosis  04/11/2013   Atypical atrial flutter (HCC) 11/29/2012   Chest pain, atypical 11/29/2012   Fibromyalgia syndrome 11/29/2012   Chronic steroid use 11/29/2012    PCP: Jimmey Mould, MD  REFERRING PROVIDER: Jimmey Mould, MD  REFERRING DIAG: M41.9 (ICD-10-CM) - Scoliosis  Rationale for Evaluation and Treatment: Rehabilitation  THERAPY DIAG:  Abnormal posture  Cervicalgia  Other idiopathic scoliosis, thoracolumbar region  Pain in thoracic spine  Chronic bilateral low back pain without sciatica  Muscle weakness (generalized)  Muscle spasm of back  ONSET DATE: chronic, years of progressive pain from degenerative scoliosis  SUBJECTIVE:                                                                                                                                                                                           SUBJECTIVE STATEMENT: I am feeling more positive today.  My daughter has been in town visiting.  My body aches everywhere.  PERTINENT HISTORY:  Degenerative scoliosis, complex medical history including pulmonary HTN, frequent PVCs and a-flutter, history of cardioversion, osteoporosis Has been SOB with activity On waitlist to get into cardiologist sooner   PAIN:  PAIN:  Are you having pain? Yes NPRS scale: 5-7/10 Pain location: abdomen, left side of trunk, low back, intermittent bil hips/buttock/knee, Rt foot pains, neck Pain orientation: Other: see above  PAIN TYPE: aching, sharp, and tight Pain description: intermittent and constant  Aggravating factors: varies Relieving factors: unsure   PRECAUTIONS: Other: osteoporosis  RED FLAGS: None   WEIGHT BEARING RESTRICTIONS: No  FALLS:  Has patient fallen in last 6 months? No  LIVING ENVIRONMENT: Lives with: lives with their family and lives alone Lives in: House/apartment Stairs: No Has following equipment at home: has cane and walker but does not use  OCCUPATION: retired  PLOF:  Independent with basic ADLs, Independent with household mobility without device, Independent with community mobility without device, and Needs assistance with homemaking  PATIENT GOALS:  be able to eat, improve strength for bowel control, feel less pain and more energy to expand  social outlets and walking  NEXT MD VISIT: as needed  OBJECTIVE:  Note: Objective measures were completed at Evaluation unless otherwise noted.  DIAGNOSTIC FINDINGS:    PATIENT SURVEYS:  PSEQ: 33/60 (<30 = low self-efficacy) Pain Catastrophizing Scale: 17 (>30 = clinically signif level of pain catastrophizing)  COGNITION: Overall cognitive status: intact but high anxiety and depression     SENSATION: WFL  MUSCLE LENGTH: 4/4 See palpation for updates Eval: Adaptive shortening of Lt obliques, QL, lat secondary to rigid scoliosis  POSTURE: rounded shoulders, forward head, decreased lumbar lordosis, and increased thoracic kyphosis, severe degenerative scoliosis with Lt SB/Rt Rot, left ribcage in near contact with left ilium  PALPATION: 4/4:  Improving mobility of soft tissues along Lt side of trunk allowing reduced pressure on bowels, bladder, stomach to allow improved bowel movements, control of bowel and bladder Improving lateral costal expansion along Lt side of trunk for improved depth of inhale and stretching of intercostals  Eval:  Spasm present in bil upper traps Fascial and muscle mobility restriction along Lt side of trunk due to adaptive shortening from scoliosis: obliques, QL, hip flexors, lat  LUMBAR ROM:   AROM eval 01/01/24  Flexion full full  Extension NT NT  Right lateral flexion 20% 30%  Left lateral flexion 40% 50%  Right rotation 40% 50%  Left rotation 20% 20%   (Blank rows = not tested)  LOWER EXTREMITY ROM:    WFL, flexion is full with good hamstring length   LOWER EXTREMITY MMT:   4/4: hip abd and ER 4/5 Eval: Grossly 4/5 throughout, 4-/5 hip abduction, ER  FUNCTIONAL  TESTS:    GAIT: Distance walked: within clinic Assistive device utilized: None Level of assistance: Modified independence Comments: short stride length  TREATMENT DATE:  02/12/24 Rt SL manual therapy - broadening and elongation of Lt obliques, QL, intercostals, paraspinals from cervical to lumbar, gluteals, hip flexors.  Gentle sacral distraction. Supine fig 4 stretch 3x20" bil Supine scissor and bicycle x10 each, alt 2 rounds Seated in chair posterior bil upper quadrant stripping, passive pec stretch bil Red tband circuit x10 each: bil ER, shoulder row, shoulder ext, horiz abd - standing  01/29/24 Rt SL manual therapy - broadening and elongation of Lt obliques, QL, intercostals, paraspinals from cervical to lumbar, gluteals, hip flexors.  Gentle sacral distraction. Rt SL "X" reach stretch of Lt arm/leg to open up short side of spine with slow, long lengthening STM with forearm by PT Supine fig 4 stretch 3x20" bil Supine DKTC rock back and forth x1' Supine scissor and bicycle x10 each, alt 2 rounds Seated in chair posterior bil upper quadrant stripping, passive pec stretch bil Red tband circuit x10 each: bil ER, shoulder row, shoulder ext, horiz abd - standing   01/22/24: Rt SL manual therapy - broadening and elongation of Lt obliques, QL, intercostals, paraspinals from cervical to lumbar, gluteals, hip flexors.  Gentle sacral distraction. Supine bicycle, scissors, sequential march x10 each, supine DKTC with rocking side to side, supine bridge 10x5" holds, manual resistance hooklying bil hip ER and IR 5x5" holds alt each way Seated in chair posterior bil upper quadrant stripping, passive pec stretch bil  PATIENT EDUCATION:  Education details: Pt education (see above) Person educated: Patient Education method: Explanation Education comprehension: verbalized understanding  HOME EXERCISE PROGRAM: Journaling to  outline some activities to try Pelvic floor - select exercises from past HEP Supine LE stretches from past HEP  Access Code: [email or web address] URL: [email or web address] Date: 12/25/2023 Prepared by: Minor Amble Alechia Lezama  Exercises - Sidelying Diaphragmatic Breathing  - 1 x daily - 7 x weekly - 1 sets - 10 reps - Seated Hip Abduction with Resistance  - 1 x daily - 7 x weekly - 2 sets - 10 reps - Standing Row with Anchored Resistance  - 1 x daily - 7 x weekly - 2 sets - 10 reps - Standing Shoulder Extension with Resistance  - 1 x daily - 7 x weekly - 2 sets - 10 reps  ASSESSMENT:  CLINICAL IMPRESSION: Pt with improving mobility, exercise tolerance and soft tissue extensibility along Lt side of trunk.   She continues to benefit from manual therapy to address severe compression from degenerative scoliosis.    OBJECTIVE IMPAIRMENTS: cardiopulmonary status limiting activity, decreased activity tolerance, decreased coordination, decreased endurance, decreased mobility, decreased ROM, decreased strength, hypomobility, increased fascial restrictions, increased muscle spasms, impaired flexibility, impaired tone, improper body mechanics, postural dysfunction, and pain.   ACTIVITY LIMITATIONS: carrying, lifting, bending, sitting, standing, squatting, sleeping, stairs, transfers, bed mobility, continence, bathing, dressing, and locomotion level  PARTICIPATION LIMITATIONS: meal prep, cleaning, laundry, shopping, and community activity  PERSONAL FACTORS: Age, Behavior pattern, Time since onset of injury/illness/exacerbation, and 1-2 comorbidities: pulmonary HTN, a-flutter, osteoporosis, severe degenerative scoliosis are also affecting patient's functional outcome.   REHAB POTENTIAL: Good  CLINICAL DECISION MAKING: Evolving/moderate complexity  EVALUATION COMPLEXITY: Moderate   GOALS: Goals reviewed with patient? Yes  SHORT TERM GOALS: Target date: 11/27/23  Pt will journal and discover at  least 1 activity to return to that she enjoys Baseline: Goal status: met, watching movies with neighbors 3/7  2.  Pt will strive to eat consistent small meals throughout the day to initiate a bowel routine Baseline:  Goal status: met 4/4, most days  3.  Pt will report 20% less pain around abdomen and trunk to allow for comfort with small meals Baseline:  Goal status: met for most days 4/4    LONG TERM GOALS: Target date: 01/01/24  Pt will return to walking program at least 2x/week for multi-system health benefits. Baseline:  Goal status: Pt does not have energy yet 4/4  2.  Pt will be ind with HEP for gluteal and pelvic floor strength to reduce incidence of bowel leakage. Baseline:  Goal status: ongoing 4/4, signif reduction in pelvic floor control during day  3.  Pt will improve score of PSEQ to at least 40 to demo improved self-efficacy within chronic pain. Baseline: 33 Goal status: ongoing  4.  Pt will establish nighttime routine including Curable app, medidation, breathing and stretching routine to improve sleep stretches. Baseline:  Goal status: ongoing,  4/4  5.  Pt will report at least 50% less pain with daily tasks and eating secondary to reduced soft tissue compression surrounding scoliosis. Baseline:  Goal status: ongoing 4/4    PLAN:  PT FREQUENCY: 1-2x/week  PT DURATION: 8 weeks  PLANNED INTERVENTIONS: 97110-Therapeutic exercises, 97530- Therapeutic activity, 97112- Neuromuscular re-education, 97535- Self Care, [id number]- Manual therapy, Dry Needling, Joint mobilization, Spinal mobilization, Cryotherapy, and Moist heat.  PLAN FOR NEXT SESSION: postural strength, ambulation for endurance, manual therapy  Rocklin Soderquist, PT 02/12/24 11:55 AM

## 2024-02-13 ENCOUNTER — Other Ambulatory Visit: Payer: Self-pay | Admitting: Cardiovascular Disease

## 2024-02-15 DIAGNOSIS — F419 Anxiety disorder, unspecified: Secondary | ICD-10-CM | POA: Diagnosis not present

## 2024-02-15 DIAGNOSIS — Z6820 Body mass index (BMI) 20.0-20.9, adult: Secondary | ICD-10-CM | POA: Diagnosis not present

## 2024-02-15 DIAGNOSIS — H9190 Unspecified hearing loss, unspecified ear: Secondary | ICD-10-CM | POA: Diagnosis not present

## 2024-02-15 DIAGNOSIS — F4320 Adjustment disorder, unspecified: Secondary | ICD-10-CM | POA: Diagnosis not present

## 2024-02-19 ENCOUNTER — Encounter: Payer: Self-pay | Admitting: Physical Therapy

## 2024-02-19 ENCOUNTER — Ambulatory Visit: Payer: Medicare Other | Admitting: Physical Therapy

## 2024-02-19 DIAGNOSIS — M546 Pain in thoracic spine: Secondary | ICD-10-CM | POA: Diagnosis not present

## 2024-02-19 DIAGNOSIS — M4125 Other idiopathic scoliosis, thoracolumbar region: Secondary | ICD-10-CM | POA: Diagnosis not present

## 2024-02-19 DIAGNOSIS — R293 Abnormal posture: Secondary | ICD-10-CM

## 2024-02-19 DIAGNOSIS — M542 Cervicalgia: Secondary | ICD-10-CM | POA: Diagnosis not present

## 2024-02-19 DIAGNOSIS — M545 Low back pain, unspecified: Secondary | ICD-10-CM | POA: Diagnosis not present

## 2024-02-19 DIAGNOSIS — G8929 Other chronic pain: Secondary | ICD-10-CM | POA: Diagnosis not present

## 2024-02-19 NOTE — Therapy (Signed)
 OUTPATIENT PHYSICAL THERAPY THORACOLUMBAR TREATMENT NOTE        Patient Name: Janet Nguyen MRN: [id number] DOB:[birth year], 89 y.o., female Today's Date: 02/19/2024  END OF SESSION:  PT End of Session - 02/19/24 1106     Visit Number 12    Date for PT Re-Evaluation 02/26/24    Authorization Type Medicare part A/B - KX at visit 15    Progress Note Due on Visit 17    PT Start Time 1100    PT Stop Time 1145    PT Time Calculation (min) 45 min    Activity Tolerance Patient tolerated treatment well    Behavior During Therapy [facility] for tasks assessed/performed                       Past Medical History:  Diagnosis Date   Arrhythmia    History of SVT with documented PVC'S and  PAC'S  12/08/12 Nuc stress test normal LV EF 74%  Event Monitor  12/01/12-01/03/13   Atrial flutter (HCC)    Celiac disease    treated by Dr. 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 Chaim Colony; pt states she was diagnosed 10-15 years ago, not treated at this time or any issues that she is aware of.   Scoliosis    Scoliosis    Sleep apnea 10/02/11 [facility]   Sleep study AHI -total sleep 10.3/hr  64.0/ hr during REM sleep.RDI 22.8/hr during total sleep 64.0/hr during REM sleep The lowest O2 sat during Non-REM and REM sleep was 86% and 88% respectively. 04/08/12 CPAP/BIPAP titration study [facility]   Past Surgical History:  Procedure Laterality Date   ANAL RECTAL MANOMETRY N/A 09/16/2023   Procedure: ANO RECTAL MANOMETRY;  Surgeon: Evangeline Hilts, MD;  Location: [facility];  Service: Gastroenterology;  Laterality: N/A;   APPENDECTOMY     ruptured at age 61 and had surgery   CARDIAC CATHETERIZATION  01/27/06   CARDIOVERSION N/A 01/08/2022   Procedure:  CARDIOVERSION;  Surgeon: Sonny Dust, MD;  Location: [facility];  Service: Cardiovascular;  Laterality: N/A;   cataract surgery  2015   Dr. Lasandra Points; March & April 2015   Patient Active Problem List   Diagnosis Date Noted   Dyspnea 05/30/2023   Pleural effusion 05/30/2023   Radon exposure 05/30/2023   Secondary hypercoagulable state (HCC) 12/12/2021   Hyponatremia 12/04/2021   Acute CHF (congestive heart failure) (HCC) 12/02/2021   Chronic diastolic heart failure (HCC) 12/02/2021   Atrial fibrillation with rapid ventricular response (HCC) 12/02/2021   Essential hypertension 12/02/2021   GERD without esophagitis 12/02/2021   Obstructive sleep apnea 12/02/2021   Interstitial lung disease (HCC) 12/02/2021   History of pulmonary embolism 12/02/2021   Pulmonary embolism (HCC) 11/06/2021   Bronchiectasis (HCC) 11/06/2021   Deviated septum 05/08/2020   Mass of subcutaneous tissue 05/02/2020   Vertigo 06/16/2019   Pulmonary hypertension, unspecified (HCC) 04/14/2019   Ischemic colitis (HCC) 04/14/2019   Migraine with aura and without status migrainosus, not intractable 11/08/2018   Degeneration of lumbar intervertebral disc 10/14/2018   Hoarseness of voice 03/04/2018   Abdominal pain 07/01/2017   Diverticulitis, colon    Metabolic acidosis, increased anion gap  Constipation 04/14/2017   Lumbar hernia 04/14/2017   Presbycusis of both ears 01/10/2017   Tinnitus aurium, bilateral 01/10/2017   Gastroesophageal reflux disease 08/20/2016   Hemoptysis 08/20/2016   Obstructive sleep apnea of adult 08/20/2016   Rhinitis, chronic 08/20/2016   Throat pain in adult 08/20/2016   Fatigue 12/23/2015   Sciatica of right side 10/06/2015   History of migraine headaches 10/06/2015   Frequent PVCs 12/28/2013   Premature atrial contractions 12/28/2013   PSVT (paroxysmal supraventricular tachycardia) (HCC) 12/28/2013   Heart palpitations 07/13/2013   Sleep apnea 04/11/2013   Scoliosis  04/11/2013   Atypical atrial flutter (HCC) 11/29/2012   Chest pain, atypical 11/29/2012   Fibromyalgia syndrome 11/29/2012   Chronic steroid use 11/29/2012    PCP: Jimmey Mould, MD  REFERRING PROVIDER: Jimmey Mould, MD  REFERRING DIAG: M41.9 (ICD-10-CM) - Scoliosis  Rationale for Evaluation and Treatment: Rehabilitation  THERAPY DIAG:  Abnormal posture  Cervicalgia  Other idiopathic scoliosis, thoracolumbar region  Pain in thoracic spine  ONSET DATE: chronic, years of progressive pain from degenerative scoliosis  SUBJECTIVE:                                                                                                                                                                                           SUBJECTIVE STATEMENT: I don't know what I'd do without you.  The PT is helping me so much.  I don't have local family or help so I am in charge of everything on my own and have been able to tolerate doing it all with less pain.  PERTINENT HISTORY:  Degenerative scoliosis, complex medical history including pulmonary HTN, frequent PVCs and a-flutter, history of cardioversion, osteoporosis Has been SOB with activity On waitlist to get into cardiologist sooner   PAIN:  PAIN:  Are you having pain? Yes NPRS scale: 5-6/10 Pain location: abdomen, left side of trunk, low back, intermittent bil hips/buttock/knee, Rt foot pains, neck Pain orientation: Other: see above  PAIN TYPE: aching, sharp, and tight Pain description: intermittent and constant  Aggravating factors: varies Relieving factors: unsure   PRECAUTIONS: Other: osteoporosis  RED FLAGS: None   WEIGHT BEARING RESTRICTIONS: No  FALLS:  Has patient fallen in last 6 months? No  LIVING ENVIRONMENT: Lives with: lives with their family and lives alone Lives in: House/apartment Stairs: No Has following equipment at home: has cane and walker but does not use  OCCUPATION: retired  PLOF:  Independent with basic ADLs, Independent with household mobility without device, Independent with community mobility without device, and Needs assistance with homemaking  PATIENT GOALS:  be able to eat, improve strength  for bowel control, feel less pain and more energy to expand social outlets and walking  NEXT MD VISIT: as needed  OBJECTIVE:  Note: Objective measures were completed at Evaluation unless otherwise noted.  DIAGNOSTIC FINDINGS:    PATIENT SURVEYS:  PSEQ: 33/60 (<30 = low self-efficacy) Pain Catastrophizing Scale: 17 (>30 = clinically signif level of pain catastrophizing)  COGNITION: Overall cognitive status: intact but high anxiety and depression     SENSATION: WFL  MUSCLE LENGTH: 4/4 See palpation for updates Eval: Adaptive shortening of Lt obliques, QL, lat secondary to rigid scoliosis  POSTURE: rounded shoulders, forward head, decreased lumbar lordosis, and increased thoracic kyphosis, severe degenerative scoliosis with Lt SB/Rt Rot, left ribcage in near contact with left ilium  PALPATION: 4/4:  Improving mobility of soft tissues along Lt side of trunk allowing reduced pressure on bowels, bladder, stomach to allow improved bowel movements, control of bowel and bladder Improving lateral costal expansion along Lt side of trunk for improved depth of inhale and stretching of intercostals  Eval:  Spasm present in bil upper traps Fascial and muscle mobility restriction along Lt side of trunk due to adaptive shortening from scoliosis: obliques, QL, hip flexors, lat  LUMBAR ROM:   AROM eval 01/01/24  Flexion full full  Extension NT NT  Right lateral flexion 20% 30%  Left lateral flexion 40% 50%  Right rotation 40% 50%  Left rotation 20% 20%   (Blank rows = not tested)  LOWER EXTREMITY ROM:    WFL, flexion is full with good hamstring length   LOWER EXTREMITY MMT:   4/4: hip abd and ER 4/5 Eval: Grossly 4/5 throughout, 4-/5 hip abduction, ER  FUNCTIONAL  TESTS:    GAIT: Distance walked: within clinic Assistive device utilized: None Level of assistance: Modified independence Comments: short stride length  TREATMENT DATE:  02/19/24 Rt SL manual therapy - broadening and elongation of Lt obliques, QL, intercostals, paraspinals from cervical to lumbar, gluteals, hip flexors.  Gentle sacral distraction. Supine core: bicycle, scissors, march alt LE x10 each Seated in chair posterior bil upper quadrant stripping, passive pec stretch bil Seated UE reach up and over to stretch Lt side of trunk 3x20" Seated UE horiz abd, row, shoulder ext x10 each Seated ball squeeze with PF lift 10x5"  02/12/24 Rt SL manual therapy - broadening and elongation of Lt obliques, QL, intercostals, paraspinals from cervical to lumbar, gluteals, hip flexors.  Gentle sacral distraction. Supine fig 4 stretch 3x20" bil Supine scissor and bicycle x10 each, alt 2 rounds Seated in chair posterior bil upper quadrant stripping, passive pec stretch bil Red tband circuit x10 each: bil ER, shoulder row, shoulder ext, horiz abd - standing  01/29/24 Rt SL manual therapy - broadening and elongation of Lt obliques, QL, intercostals, paraspinals from cervical to lumbar, gluteals, hip flexors.  Gentle sacral distraction. Rt SL "X" reach stretch of Lt arm/leg to open up short side of spine with slow, long lengthening STM with forearm by PT Supine fig 4 stretch 3x20" bil Supine DKTC rock back and forth x1' Supine scissor and bicycle x10 each, alt 2 rounds Seated in chair posterior bil upper quadrant stripping, passive pec stretch bil Red tband circuit x10 each: bil ER, shoulder row, shoulder ext, horiz abd - standing  PATIENT EDUCATION:  Education details: Pt education (see above) Person educated: Patient Education method: Explanation Education comprehension: verbalized understanding  HOME EXERCISE  PROGRAM: Journaling to outline some activities to try Pelvic floor - select exercises from past HEP Supine LE stretches from past HEP  Access Code: [email or web address] URL: [email or web address] Date: 12/25/2023 Prepared by: Minor Amble Friend Dorfman  Exercises - Sidelying Diaphragmatic Breathing  - 1 x daily - 7 x weekly - 1 sets - 10 reps - Seated Hip Abduction with Resistance  - 1 x daily - 7 x weekly - 2 sets - 10 reps - Standing Row with Anchored Resistance  - 1 x daily - 7 x weekly - 2 sets - 10 reps - Standing Shoulder Extension with Resistance  - 1 x daily - 7 x weekly - 2 sets - 10 reps  ASSESSMENT:  CLINICAL IMPRESSION: Pt reports she is tolerating all household, yard and errands with less pain having had more consistent PT.  She demos improving mobility, exercise tolerance and soft tissue extensibility along Lt side of trunk.  She had some good increase in GI noise with soft tissue release today which should help her move her bowels.  She continues to benefit from manual therapy to address severe compression from degenerative scoliosis.    OBJECTIVE IMPAIRMENTS: cardiopulmonary status limiting activity, decreased activity tolerance, decreased coordination, decreased endurance, decreased mobility, decreased ROM, decreased strength, hypomobility, increased fascial restrictions, increased muscle spasms, impaired flexibility, impaired tone, improper body mechanics, postural dysfunction, and pain.   ACTIVITY LIMITATIONS: carrying, lifting, bending, sitting, standing, squatting, sleeping, stairs, transfers, bed mobility, continence, bathing, dressing, and locomotion level  PARTICIPATION LIMITATIONS: meal prep, cleaning, laundry, shopping, and community activity  PERSONAL FACTORS: Age, Behavior pattern, Time since onset of injury/illness/exacerbation, and 1-2 comorbidities: pulmonary HTN, a-flutter, osteoporosis, severe degenerative scoliosis are also affecting patient's functional outcome.    REHAB POTENTIAL: Good  CLINICAL DECISION MAKING: Evolving/moderate complexity  EVALUATION COMPLEXITY: Moderate   GOALS: Goals reviewed with patient? Yes  SHORT TERM GOALS: Target date: 11/27/23  Pt will journal and discover at least 1 activity to return to that she enjoys Baseline: Goal status: met, watching movies with neighbors 3/7  2.  Pt will strive to eat consistent small meals throughout the day to initiate a bowel routine Baseline:  Goal status: met 4/4, most days  3.  Pt will report 20% less pain around abdomen and trunk to allow for comfort with small meals Baseline:  Goal status: met for most days 4/4    LONG TERM GOALS: Target date: 01/01/24  Pt will return to walking program at least 2x/week for multi-system health benefits. Baseline:  Goal status: Pt does not have energy yet 4/4  2.  Pt will be ind with HEP for gluteal and pelvic floor strength to reduce incidence of bowel leakage. Baseline:  Goal status: ongoing 4/4, signif reduction in pelvic floor control during day  3.  Pt will improve score of PSEQ to at least 40 to demo improved self-efficacy within chronic pain. Baseline: 33 Goal status: ongoing  4.  Pt will establish nighttime routine including Curable app, medidation, breathing and stretching routine to improve sleep stretches. Baseline:  Goal status: ongoing,  4/4  5.  Pt will report at least 50% less pain with daily tasks and eating secondary to reduced soft tissue compression surrounding scoliosis. Baseline:  Goal status: ongoing 4/4    PLAN:  PT FREQUENCY: 1-2x/week  PT DURATION: 8 weeks  PLANNED INTERVENTIONS: 97110-Therapeutic exercises, 97530- Therapeutic activity, 97112-  Neuromuscular re-education, 97535- Self Care, [phone]- Manual therapy, Dry Needling, Joint mobilization, Spinal mobilization, Cryotherapy, and Moist heat.  PLAN FOR NEXT SESSION: postural strength, ambulation for endurance, manual therapy  Azel Gumina,  PT 02/19/24 12:03 PM

## 2024-02-23 ENCOUNTER — Ambulatory Visit: Admitting: Primary Care

## 2024-02-26 ENCOUNTER — Ambulatory Visit: Payer: Medicare Other | Admitting: Physical Therapy

## 2024-02-26 ENCOUNTER — Encounter: Payer: Self-pay | Admitting: Physical Therapy

## 2024-02-26 DIAGNOSIS — G8929 Other chronic pain: Secondary | ICD-10-CM | POA: Diagnosis not present

## 2024-02-26 DIAGNOSIS — M4125 Other idiopathic scoliosis, thoracolumbar region: Secondary | ICD-10-CM

## 2024-02-26 DIAGNOSIS — M542 Cervicalgia: Secondary | ICD-10-CM

## 2024-02-26 DIAGNOSIS — M546 Pain in thoracic spine: Secondary | ICD-10-CM | POA: Diagnosis not present

## 2024-02-26 DIAGNOSIS — M545 Low back pain, unspecified: Secondary | ICD-10-CM | POA: Diagnosis not present

## 2024-02-26 DIAGNOSIS — R293 Abnormal posture: Secondary | ICD-10-CM | POA: Diagnosis not present

## 2024-02-26 DIAGNOSIS — M62830 Muscle spasm of back: Secondary | ICD-10-CM

## 2024-02-26 DIAGNOSIS — R279 Unspecified lack of coordination: Secondary | ICD-10-CM

## 2024-02-26 NOTE — Therapy (Signed)
 OUTPATIENT PHYSICAL THERAPY THORACOLUMBAR TREATMENT NOTE        Patient Name: Janet Nguyen MRN: [id number] DOB:[birth year], 89 y.o., female Today's Date: 02/26/2024  END OF SESSION:  PT End of Session - 02/26/24 1155     Visit Number 13    Date for PT Re-Evaluation 04/22/24    Authorization Type Medicare part A/B - KX at visit 15    Progress Note Due on Visit 17    PT Start Time 1100    PT Stop Time 1145    PT Time Calculation (min) 45 min    Activity Tolerance Patient tolerated treatment well    Behavior During Therapy [facility] for tasks assessed/performed                        Past Medical History:  Diagnosis Date   Arrhythmia    History of SVT with documented PVC'S and  PAC'S  12/08/12 Nuc stress test normal LV EF 74%  Event Monitor  12/01/12-01/03/13   Atrial flutter (HCC)    Celiac disease    treated by Dr. 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 Chaim Colony; pt states she was diagnosed 10-15 years ago, not treated at this time or any issues that she is aware of.   Scoliosis    Scoliosis    Sleep apnea 10/02/11 [facility]   Sleep study AHI -total sleep 10.3/hr  64.0/ hr during REM sleep.RDI 22.8/hr during total sleep 64.0/hr during REM sleep The lowest O2 sat during Non-REM and REM sleep was 86% and 88% respectively. 04/08/12 CPAP/BIPAP titration study [facility]   Past Surgical History:  Procedure Laterality Date   ANAL RECTAL MANOMETRY N/A 09/16/2023   Procedure: ANO RECTAL MANOMETRY;  Surgeon: Evangeline Hilts, MD;  Location: [facility] ENDOSCOPY;  Service: Gastroenterology;  Laterality: N/A;   APPENDECTOMY     ruptured at age 70 and had surgery   CARDIAC CATHETERIZATION  01/27/06   CARDIOVERSION N/A 01/08/2022    Procedure: CARDIOVERSION;  Surgeon: Sonny Dust, MD;  Location: [facility] ENDOSCOPY;  Service: Cardiovascular;  Laterality: N/A;   cataract surgery  2015   Dr. Lasandra Points; March & April 2015   Patient Active Problem List   Diagnosis Date Noted   Dyspnea 05/30/2023   Pleural effusion 05/30/2023   Radon exposure 05/30/2023   Secondary hypercoagulable state (HCC) 12/12/2021   Hyponatremia 12/04/2021   Acute CHF (congestive heart failure) (HCC) 12/02/2021   Chronic diastolic heart failure (HCC) 12/02/2021   Atrial fibrillation with rapid ventricular response (HCC) 12/02/2021   Essential hypertension 12/02/2021   GERD without esophagitis 12/02/2021   Obstructive sleep apnea 12/02/2021   Interstitial lung disease (HCC) 12/02/2021   History of pulmonary embolism 12/02/2021   Pulmonary embolism (HCC) 11/06/2021   Bronchiectasis (HCC) 11/06/2021   Deviated septum 05/08/2020   Mass of subcutaneous tissue 05/02/2020   Vertigo 06/16/2019   Pulmonary hypertension, unspecified (HCC) 04/14/2019   Ischemic colitis (HCC) 04/14/2019   Migraine with aura and without status migrainosus, not intractable 11/08/2018   Degeneration of lumbar intervertebral disc 10/14/2018   Hoarseness of voice 03/04/2018   Abdominal pain 07/01/2017   Diverticulitis, colon    Metabolic acidosis, increased anion gap  Constipation 04/14/2017   Lumbar hernia 04/14/2017   Presbycusis of both ears 01/10/2017   Tinnitus aurium, bilateral 01/10/2017   Gastroesophageal reflux disease 08/20/2016   Hemoptysis 08/20/2016   Obstructive sleep apnea of adult 08/20/2016   Rhinitis, chronic 08/20/2016   Throat pain in adult 08/20/2016   Fatigue 12/23/2015   Sciatica of right side 10/06/2015   History of migraine headaches 10/06/2015   Frequent PVCs 12/28/2013   Premature atrial contractions 12/28/2013   PSVT (paroxysmal supraventricular tachycardia) (HCC) 12/28/2013   Heart palpitations 07/13/2013   Sleep apnea 04/11/2013    Scoliosis 04/11/2013   Atypical atrial flutter (HCC) 11/29/2012   Chest pain, atypical 11/29/2012   Fibromyalgia syndrome 11/29/2012   Chronic steroid use 11/29/2012    PCP: Jimmey Mould, MD  REFERRING PROVIDER: Jimmey Mould, MD  REFERRING DIAG: M41.9 (ICD-10-CM) - Scoliosis  Rationale for Evaluation and Treatment: Rehabilitation  THERAPY DIAG:  Abnormal posture  Cervicalgia  Other idiopathic scoliosis, thoracolumbar region  Pain in thoracic spine  Chronic bilateral low back pain without sciatica  Muscle spasm of back  Unspecified lack of coordination  ONSET DATE: chronic, years of progressive pain from degenerative scoliosis  SUBJECTIVE:                                                                                                                                                                                           SUBJECTIVE STATEMENT: Today isn't as good of a day - I think all the rain has me hurting more and not doing as much.  I am less likely to do outings when the weather is bad. The PT is helping me so much.  I don't have local family or help so I am in charge of everything on my own and have been able to tolerate doing it all with less pain.  PERTINENT HISTORY:  Degenerative scoliosis, complex medical history including pulmonary HTN, frequent PVCs and a-flutter, history of cardioversion, osteoporosis Has been SOB with activity On waitlist to get into cardiologist sooner   PAIN:  PAIN:  Are you having pain? Yes NPRS scale: 5-6/10 Pain location: abdomen, left side of trunk, low back, intermittent bil hips/buttock/knee, Rt foot pains, neck Pain orientation: Other: see above  PAIN TYPE: aching, sharp, and tight Pain description: intermittent and constant  Aggravating factors: varies Relieving factors: unsure   PRECAUTIONS: Other: osteoporosis  RED FLAGS: None   WEIGHT BEARING RESTRICTIONS: No  FALLS:  Has patient fallen in last 6  months? No  LIVING ENVIRONMENT: Lives with: lives with their family and lives alone Lives in: House/apartment Stairs: No Has following equipment at home:  has cane and walker but does not use  OCCUPATION: retired  PLOF: Independent with basic ADLs, Independent with household mobility without device, Independent with community mobility without device, and Needs assistance with homemaking  PATIENT GOALS:  be able to eat, improve strength for bowel control, feel less pain and more energy to expand social outlets and walking  NEXT MD VISIT: as needed  OBJECTIVE:  Note: Objective measures were completed at Evaluation unless otherwise noted.  DIAGNOSTIC FINDINGS:    PATIENT SURVEYS:  PSEQ: 33/60 (<30 = low self-efficacy) Pain Catastrophizing Scale: 17 (>30 = clinically signif level of pain catastrophizing)  COGNITION: Overall cognitive status: intact with improved outlook     SENSATION: WFL  MUSCLE LENGTH: 5/30: see palpation for updates 4/4 See palpation for updates Eval: Adaptive shortening of Lt obliques, QL, lat secondary to rigid scoliosis  POSTURE: rounded shoulders, forward head, decreased lumbar lordosis, and increased thoracic kyphosis, severe degenerative scoliosis with Lt SB/Rt Rot, left ribcage in near contact with left ilium  PALPATION: 5/30: Improved soft tissue extensibility along Lt side of trunk, improving lateral costal expansion along Lt side of trunk, improving gut sounds and motility along Lt side of trunk, reducing trigger points in Lt lat and shoulder  4/4:  Improving mobility of soft tissues along Lt side of trunk allowing reduced pressure on bowels, bladder, stomach to allow improved bowel movements, control of bowel and bladder Improving lateral costal expansion along Lt side of trunk for improved depth of inhale and stretching of intercostals  Eval:  Spasm present in bil upper traps Fascial and muscle mobility restriction along Lt side of trunk due  to adaptive shortening from scoliosis: obliques, QL, hip flexors, lat  LUMBAR ROM:   AROM eval 01/01/24 02/26/24  Flexion full full full  Extension NT NT NT  Right lateral flexion 20% 30% 40%  Left lateral flexion 40% 50% 75%  Right rotation 40% 50% 50%  Left rotation 20% 20% 30%   (Blank rows = not tested)  LOWER EXTREMITY ROM:    WFL, flexion is full with good hamstring length   LOWER EXTREMITY MMT:   4/4: hip abd and ER 4/5 Eval: Grossly 4/5 throughout, 4-/5 hip abduction, ER  FUNCTIONAL TESTS:    GAIT: Distance walked: within clinic Assistive device utilized: None Level of assistance: Modified independence Comments: short stride length  TREATMENT DATE:  02/26/24 Seated UE reach up and over to stretch Lt side of trunk 3x20" while discussing status Seated UE horiz abd, row, shoulder ext, shoulder ER 2x10 each with red band Seated ball squeeze with PF lift 10x5", seated clam red loop 2x10 Sit to stand with ball squeeze and PF lift x5, then sit to stand with 2lb chest press  Seated in chair posterior bil upper quadrant stripping, passive pec stretch bil Rt SL manual therapy - broadening and elongation of Lt obliques, QL, intercostals, paraspinals from cervical to lumbar, gluteals, hip flexors.  Gentle sacral distraction.  02/19/24 Rt SL manual therapy - broadening and elongation of Lt obliques, QL, intercostals, paraspinals from cervical to lumbar, gluteals, hip flexors.  Gentle sacral distraction. Supine core: bicycle, scissors, march alt LE x10 each Seated in chair posterior bil upper quadrant stripping, passive pec stretch bil Seated UE reach up and over to stretch Lt side of trunk 3x20" Seated UE horiz abd, row, shoulder ext x10 each Seated ball squeeze with PF lift 10x5"  02/12/24 Rt SL manual therapy - broadening and elongation of Lt obliques, QL, intercostals, paraspinals from  cervical to lumbar, gluteals, hip flexors.  Gentle sacral distraction. Supine fig 4 stretch  3x20" bil Supine scissor and bicycle x10 each, alt 2 rounds Seated in chair posterior bil upper quadrant stripping, passive pec stretch bil Red tband circuit x10 each: bil ER, shoulder row, shoulder ext, horiz abd - standing                                                                              PATIENT EDUCATION:  Education details: Pt education (see above) Person educated: Patient Education method: Explanation Education comprehension: verbalized understanding  HOME EXERCISE PROGRAM: Journaling to outline some activities to try Pelvic floor - select exercises from past HEP Supine LE stretches from past HEP  Access Code: [email or web address] URL: [email or web address] Date: 12/25/2023 Prepared by: Minor Amble Jeralyn Nolden  Exercises - Sidelying Diaphragmatic Breathing  - 1 x daily - 7 x weekly - 1 sets - 10 reps - Seated Hip Abduction with Resistance  - 1 x daily - 7 x weekly - 2 sets - 10 reps - Standing Row with Anchored Resistance  - 1 x daily - 7 x weekly - 2 sets - 10 reps - Standing Shoulder Extension with Resistance  - 1 x daily - 7 x weekly - 2 sets - 10 reps  ASSESSMENT:  CLINICAL IMPRESSION: Pt reports she is tolerating all household, yard and errands with less pain having had more consistent PT.  She feels her outlook is improved as she is feeling more pain control with consistent PT.  She demos improving mobility, exercise tolerance and soft tissue extensibility along Lt side of trunk.  She reports improving bowel routine.  She continues to have limited endurance secondary to severe degenerative scoliosis compressing Lt lung and diaphragm.  This patient is familiar to treating PT over several years and believes she will regress without consistent continued treatment.  Plan to extend PT 1x/week for another 8 weeks to maximize tolerance of daily activities, manage pain, and optimize systems affected severe compression from degenerative scoliosis (GI, cardiovascular,  musculoskeletal).    OBJECTIVE IMPAIRMENTS: cardiopulmonary status limiting activity, decreased activity tolerance, decreased coordination, decreased endurance, decreased mobility, decreased ROM, decreased strength, hypomobility, increased fascial restrictions, increased muscle spasms, impaired flexibility, impaired tone, improper body mechanics, postural dysfunction, and pain.   ACTIVITY LIMITATIONS: carrying, lifting, bending, sitting, standing, squatting, sleeping, stairs, transfers, bed mobility, continence, bathing, dressing, and locomotion level  PARTICIPATION LIMITATIONS: meal prep, cleaning, laundry, shopping, and community activity  PERSONAL FACTORS: Age, Behavior pattern, Time since onset of injury/illness/exacerbation, and 1-2 comorbidities: pulmonary HTN, a-flutter, osteoporosis, severe degenerative scoliosis are also affecting patient's functional outcome.   REHAB POTENTIAL: Good  CLINICAL DECISION MAKING: Evolving/moderate complexity  EVALUATION COMPLEXITY: Moderate   GOALS: Goals reviewed with patient? Yes  SHORT TERM GOALS: Target date: 11/27/23  Pt will journal and discover at least 1 activity to return to that she enjoys Baseline: Goal status: met, watching movies with neighbors 3/7  2.  Pt will strive to eat consistent small meals throughout the day to initiate a bowel routine Baseline:  Goal status: met 4/4, most days  3.  Pt will report 20% less pain around abdomen and trunk to allow for comfort with small  meals Baseline:  Goal status: met for most days 4/4    LONG TERM GOALS: Target date: 01/01/24  Pt will return to walking program at least 2x/week for multi-system health benefits. Baseline:  Goal status: will walk one lap around neighborhood 1-2x/day and participate in short errands  2.  Pt will be ind with HEP for gluteal and pelvic floor strength to reduce incidence of bowel leakage. Baseline:  Goal status: ongoing 5/30, notes about 25% less  leakage  3.  Pt will improve score of PSEQ to at least 40 to demo improved self-efficacy within chronic pain. Baseline: 33 Goal status: ongoing  4.  Pt will establish nighttime routine including Cur[employer] app, medidation, breathing and stretching routine to improve sleep stretches. Baseline:  Goal status: MET 5/30, has established consistent routine  5.  Pt will report at least 50% less pain with daily tasks and eating secondary to reduced soft tissue compression surrounding scoliosis. Baseline:  Goal status: ONGOING 5/30, reports 25% improvement most days of the week    PLAN:  PT FREQUENCY: 1-2x/week  PT DURATION: 8 weeks  PLANNED INTERVENTIONS: 97110-Therapeutic exercises, 97530- Therapeutic activity, 97112- Neuromuscular re-education, 97535- Self Care, [id number]- Manual therapy, Dry Needling, Joint mobilization, Spinal mobilization, Cryotherapy, and Moist heat.  PLAN FOR NEXT SESSION: postural strength, incorporate PF education and training with therex, ambulation for endurance, manual therapy   Roderic Lammert, PT 02/26/24 11:56 AM

## 2024-03-04 ENCOUNTER — Encounter: Payer: Self-pay | Admitting: Physical Therapy

## 2024-03-04 ENCOUNTER — Ambulatory Visit: Payer: Medicare Other | Attending: Family Medicine | Admitting: Physical Therapy

## 2024-03-04 DIAGNOSIS — G8929 Other chronic pain: Secondary | ICD-10-CM | POA: Diagnosis not present

## 2024-03-04 DIAGNOSIS — M6281 Muscle weakness (generalized): Secondary | ICD-10-CM | POA: Insufficient documentation

## 2024-03-04 DIAGNOSIS — M542 Cervicalgia: Secondary | ICD-10-CM | POA: Insufficient documentation

## 2024-03-04 DIAGNOSIS — M545 Low back pain, unspecified: Secondary | ICD-10-CM | POA: Insufficient documentation

## 2024-03-04 DIAGNOSIS — M62830 Muscle spasm of back: Secondary | ICD-10-CM | POA: Insufficient documentation

## 2024-03-04 DIAGNOSIS — R293 Abnormal posture: Secondary | ICD-10-CM | POA: Insufficient documentation

## 2024-03-04 DIAGNOSIS — R279 Unspecified lack of coordination: Secondary | ICD-10-CM | POA: Insufficient documentation

## 2024-03-04 DIAGNOSIS — M546 Pain in thoracic spine: Secondary | ICD-10-CM | POA: Diagnosis not present

## 2024-03-04 DIAGNOSIS — M4125 Other idiopathic scoliosis, thoracolumbar region: Secondary | ICD-10-CM | POA: Diagnosis not present

## 2024-03-04 NOTE — Therapy (Signed)
 OUTPATIENT PHYSICAL THERAPY THORACOLUMBAR TREATMENT NOTE        Patient Name: Janet Nguyen MRN: [id number] DOB:[birth year], 89 y.o., female Today's Date: 03/04/2024  END OF SESSION:  PT End of Session - 03/04/24 1103     Visit Number 14    Date for PT Re-Evaluation 04/22/24    Authorization Type Medicare part A/B - KX at visit 15    Progress Note Due on Visit 17    PT Start Time 1100    PT Stop Time 1145    PT Time Calculation (min) 45 min    Activity Tolerance Patient tolerated treatment well    Behavior During Therapy [facility] for tasks assessed/performed                         Past Medical History:  Diagnosis Date   Arrhythmia    History of SVT with documented PVC'S and  PAC'S  12/08/12 Nuc stress test normal LV EF 74%  Event Monitor  12/01/12-01/03/13   Atrial flutter (HCC)    Celiac disease    treated by Dr. 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 Chaim Colony; pt states she was diagnosed 10-15 years ago, not treated at this time or any issues that she is aware of.   Scoliosis    Scoliosis    Sleep apnea 10/02/11 [facility]   Sleep study AHI -total sleep 10.3/hr  64.0/ hr during REM sleep.RDI 22.8/hr during total sleep 64.0/hr during REM sleep The lowest O2 sat during Non-REM and REM sleep was 86% and 88% respectively. 04/08/12 CPAP/BIPAP titration study [facility]   Past Surgical History:  Procedure Laterality Date   ANAL RECTAL MANOMETRY N/A 09/16/2023   Procedure: ANO RECTAL MANOMETRY;  Surgeon: Evangeline Hilts, MD;  Location: [facility] ENDOSCOPY;  Service: Gastroenterology;  Laterality: N/A;   APPENDECTOMY     ruptured at age 66 and had surgery   CARDIAC CATHETERIZATION  01/27/06   CARDIOVERSION N/A 01/08/2022    Procedure: CARDIOVERSION;  Surgeon: Sonny Dust, MD;  Location: [facility];  Service: Cardiovascular;  Laterality: N/A;   cataract surgery  2015   Dr. Lasandra Points; March & April 2015   Patient Active Problem List   Diagnosis Date Noted   Dyspnea 05/30/2023   Pleural effusion 05/30/2023   Radon exposure 05/30/2023   Secondary hypercoagulable state (HCC) 12/12/2021   Hyponatremia 12/04/2021   Acute CHF (congestive heart failure) (HCC) 12/02/2021   Chronic diastolic heart failure (HCC) 12/02/2021   Atrial fibrillation with rapid ventricular response (HCC) 12/02/2021   Essential hypertension 12/02/2021   GERD without esophagitis 12/02/2021   Obstructive sleep apnea 12/02/2021   Interstitial lung disease (HCC) 12/02/2021   History of pulmonary embolism 12/02/2021   Pulmonary embolism (HCC) 11/06/2021   Bronchiectasis (HCC) 11/06/2021   Deviated septum 05/08/2020   Mass of subcutaneous tissue 05/02/2020   Vertigo 06/16/2019   Pulmonary hypertension, unspecified (HCC) 04/14/2019   Ischemic colitis (HCC) 04/14/2019   Migraine with aura and without status migrainosus, not intractable 11/08/2018   Degeneration of lumbar intervertebral disc 10/14/2018   Hoarseness of voice 03/04/2018   Abdominal pain 07/01/2017   Diverticulitis, colon    Metabolic acidosis, increased anion gap  Constipation 04/14/2017   Lumbar hernia 04/14/2017   Presbycusis of both ears 01/10/2017   Tinnitus aurium, bilateral 01/10/2017   Gastroesophageal reflux disease 08/20/2016   Hemoptysis 08/20/2016   Obstructive sleep apnea of adult 08/20/2016   Rhinitis, chronic 08/20/2016   Throat pain in adult 08/20/2016   Fatigue 12/23/2015   Sciatica of right side 10/06/2015   History of migraine headaches 10/06/2015   Frequent PVCs 12/28/2013   Premature atrial contractions 12/28/2013   PSVT (paroxysmal supraventricular tachycardia) (HCC) 12/28/2013   Heart palpitations 07/13/2013   Sleep apnea 04/11/2013    Scoliosis 04/11/2013   Atypical atrial flutter (HCC) 11/29/2012   Chest pain, atypical 11/29/2012   Fibromyalgia syndrome 11/29/2012   Chronic steroid use 11/29/2012    PCP: Jimmey Mould, MD  REFERRING PROVIDER: Jimmey Mould, MD  REFERRING DIAG: M41.9 (ICD-10-CM) - Scoliosis  Rationale for Evaluation and Treatment: Rehabilitation  THERAPY DIAG:  Abnormal posture  Cervicalgia  Other idiopathic scoliosis, thoracolumbar region  Pain in thoracic spine  Chronic bilateral low back pain without sciatica  Muscle spasm of back  Unspecified lack of coordination  Muscle weakness (generalized)  ONSET DATE: chronic, years of progressive pain from degenerative scoliosis  SUBJECTIVE:                                                                                                                                                                                           SUBJECTIVE STATEMENT: My days are better because of PT.  PERTINENT HISTORY:  Degenerative scoliosis, complex medical history including pulmonary HTN, frequent PVCs and a-flutter, history of cardioversion, osteoporosis Has been SOB with activity On waitlist to get into cardiologist sooner   PAIN:  PAIN:  Are you having pain? Yes NPRS scale: 5/10 Pain location: abdomen, left side of trunk, low back, intermittent bil hips/buttock/knee, Rt foot pains, neck Pain orientation: Other: see above  PAIN TYPE: aching, sharp, and tight Pain description: intermittent and constant  Aggravating factors: varies Relieving factors: unsure   PRECAUTIONS: Other: osteoporosis  RED FLAGS: None   WEIGHT BEARING RESTRICTIONS: No  FALLS:  Has patient fallen in last 6 months? No  LIVING ENVIRONMENT: Lives with: lives with their family and lives alone Lives in: House/apartment Stairs: No Has following equipment at home: has cane and walker but does not use  OCCUPATION: retired  PLOF: Independent with basic ADLs,  Independent with household mobility without device, Independent with community mobility without device, and Needs assistance with homemaking  PATIENT GOALS:  be able to eat, improve strength for bowel control, feel less pain and more energy to expand social outlets and walking  NEXT  MD VISIT: as needed  OBJECTIVE:  Note: Objective measures were completed at Evaluation unless otherwise noted.  DIAGNOSTIC FINDINGS:    PATIENT SURVEYS:  PSEQ: 33/60 (<30 = low self-efficacy) Pain Catastrophizing Scale: 17 (>30 = clinically signif level of pain catastrophizing)  COGNITION: Overall cognitive status: intact with improved outlook     SENSATION: WFL  MUSCLE LENGTH: 5/30: see palpation for updates 4/4 See palpation for updates Eval: Adaptive shortening of Lt obliques, QL, lat secondary to rigid scoliosis  POSTURE: rounded shoulders, forward head, decreased lumbar lordosis, and increased thoracic kyphosis, severe degenerative scoliosis with Lt SB/Rt Rot, left ribcage in near contact with left ilium  PALPATION: 5/30: Improved soft tissue extensibility along Lt side of trunk, improving lateral costal expansion along Lt side of trunk, improving gut sounds and motility along Lt side of trunk, reducing trigger points in Lt lat and shoulder  4/4:  Improving mobility of soft tissues along Lt side of trunk allowing reduced pressure on bowels, bladder, stomach to allow improved bowel movements, control of bowel and bladder Improving lateral costal expansion along Lt side of trunk for improved depth of inhale and stretching of intercostals  Eval:  Spasm present in bil upper traps Fascial and muscle mobility restriction along Lt side of trunk due to adaptive shortening from scoliosis: obliques, QL, hip flexors, lat  LUMBAR ROM:   AROM eval 01/01/24 02/26/24  Flexion full full full  Extension NT NT NT  Right lateral flexion 20% 30% 40%  Left lateral flexion 40% 50% 75%  Right rotation 40% 50%  50%  Left rotation 20% 20% 30%   (Blank rows = not tested)  LOWER EXTREMITY ROM:    WFL, flexion is full with good hamstring length   LOWER EXTREMITY MMT:   4/4: hip abd and ER 4/5 Eval: Grossly 4/5 throughout, 4-/5 hip abduction, ER  FUNCTIONAL TESTS:    GAIT: Distance walked: within clinic Assistive device utilized: None Level of assistance: Modified independence Comments: short stride length  TREATMENT DATE:  03/04/24 Rt SL manual therapy - broadening and elongation of Lt obliques, QL, intercostals, paraspinals from cervical to lumbar, gluteals, hip flexors.  Gentle sacral distraction. Supine core: bicycle, scissors, march alt LE x10 each Seated in chair posterior bil upper quadrant stripping, passive pec stretch bil Seated UE reach up and over to stretch Lt side of trunk 3x20" Seated UE horiz abd, row, shoulder ext x10 each Seated ball squeeze with PF lift 10x5"  02/26/24 Seated UE reach up and over to stretch Lt side of trunk 3x20" while discussing status Seated UE horiz abd, row, shoulder ext, shoulder ER 2x10 each with red band Seated ball squeeze with PF lift 10x5", seated clam red loop 2x10 Sit to stand with ball squeeze and PF lift x5, then sit to stand with 2lb chest press  Seated in chair posterior bil upper quadrant stripping, passive pec stretch bil Rt SL manual therapy - broadening and elongation of Lt obliques, QL, intercostals, paraspinals from cervical to lumbar, gluteals, hip flexors.  Gentle sacral distraction.  02/19/24 Rt SL manual therapy - broadening and elongation of Lt obliques, QL, intercostals, paraspinals from cervical to lumbar, gluteals, hip flexors.  Gentle sacral distraction. Supine core: bicycle, scissors, march alt LE x10 each Seated in chair posterior bil upper quadrant stripping, passive pec stretch bil Seated UE reach up and over to stretch Lt side of trunk 3x20" Seated UE horiz abd, row, shoulder ext x10 each Seated ball squeeze with PF  lift 10x5"  PATIENT EDUCATION:  Education details: Pt education (see above) Person educated: Patient Education method: Explanation Education comprehension: verbalized understanding  HOME EXERCISE PROGRAM: Journaling to outline some activities to try Pelvic floor - select exercises from past HEP Supine LE stretches from past HEP  Access Code: [email or web address] URL: [email or web address] Date: 12/25/2023 Prepared by: Minor Amble Malynda Smolinski  Exercises - Sidelying Diaphragmatic Breathing  - 1 x daily - 7 x weekly - 1 sets - 10 reps - Seated Hip Abduction with Resistance  - 1 x daily - 7 x weekly - 2 sets - 10 reps - Standing Row with Anchored Resistance  - 1 x daily - 7 x weekly - 2 sets - 10 reps - Standing Shoulder Extension with Resistance  - 1 x daily - 7 x weekly - 2 sets - 10 reps  ASSESSMENT:  CLINICAL IMPRESSION: Pt reports she is tolerating all household, yard and errands with less pain having had more consistent PT.  She feels her outlook is improved as she is feeling more pain control with consistent PT.  She demos improving mobility, exercise tolerance and soft tissue extensibility along Lt side of trunk.  She continues to have limited endurance secondary to severe degenerative scoliosis compressing Lt lung and diaphragm. She will continue to benefit from PT to manage pain and optimize systems affected severe compression from degenerative scoliosis (GI, cardiovascular, musculoskeletal).    OBJECTIVE IMPAIRMENTS: cardiopulmonary status limiting activity, decreased activity tolerance, decreased coordination, decreased endurance, decreased mobility, decreased ROM, decreased strength, hypomobility, increased fascial restrictions, increased muscle spasms, impaired flexibility, impaired tone, improper body mechanics, postural dysfunction, and pain.   ACTIVITY LIMITATIONS: carrying, lifting, bending, sitting,  standing, squatting, sleeping, stairs, transfers, bed mobility, continence, bathing, dressing, and locomotion level  PARTICIPATION LIMITATIONS: meal prep, cleaning, laundry, shopping, and community activity  PERSONAL FACTORS: Age, Behavior pattern, Time since onset of injury/illness/exacerbation, and 1-2 comorbidities: pulmonary HTN, a-flutter, osteoporosis, severe degenerative scoliosis are also affecting patient's functional outcome.   REHAB POTENTIAL: Good  CLINICAL DECISION MAKING: Evolving/moderate complexity  EVALUATION COMPLEXITY: Moderate   GOALS: Goals reviewed with patient? Yes  SHORT TERM GOALS: Target date: 11/27/23  Pt will journal and discover at least 1 activity to return to that she enjoys Baseline: Goal status: met, watching movies with neighbors 3/7  2.  Pt will strive to eat consistent small meals throughout the day to initiate a bowel routine Baseline:  Goal status: met 4/4, most days  3.  Pt will report 20% less pain around abdomen and trunk to allow for comfort with small meals Baseline:  Goal status: met for most days 4/4    LONG TERM GOALS: Target date: 01/01/24  Pt will return to walking program at least 2x/week for multi-system health benefits. Baseline:  Goal status: will walk one lap around neighborhood 1-2x/day and participate in short errands  2.  Pt will be ind with HEP for gluteal and pelvic floor strength to reduce incidence of bowel leakage. Baseline:  Goal status: ongoing 5/30, notes about 25% less leakage  3.  Pt will improve score of PSEQ to at least 40 to demo improved self-efficacy within chronic pain. Baseline: 33 Goal status: ongoing  4.  Pt will establish nighttime routine including Cur[employer] app, medidation, breathing and stretching routine to improve sleep stretches. Baseline:  Goal status: MET 5/30, has established consistent routine  5.  Pt will report at least 50% less pain with daily tasks and eating secondary to reduced  soft tissue compression surrounding scoliosis. Baseline:  Goal  status: ONGOING 5/30, reports 25% improvement most days of the week    PLAN:  PT FREQUENCY: 1-2x/week  PT DURATION: 8 weeks  PLANNED INTERVENTIONS: 97110-Therapeutic exercises, 97530- Therapeutic activity, 97112- Neuromuscular re-education, 97535- Self Care, [id number]- Manual therapy, Dry Needling, Joint mobilization, Spinal mobilization, Cryotherapy, and Moist heat.  PLAN FOR NEXT SESSION: postural strength, incorporate PF education and training with therex, ambulation for endurance, manual therapy   Jin Shockley, PT 03/04/24 11:53 AM

## 2024-03-08 ENCOUNTER — Other Ambulatory Visit: Payer: Self-pay

## 2024-03-08 DIAGNOSIS — S8991XA Unspecified injury of right lower leg, initial encounter: Secondary | ICD-10-CM | POA: Diagnosis not present

## 2024-03-08 DIAGNOSIS — S0990XA Unspecified injury of head, initial encounter: Secondary | ICD-10-CM | POA: Diagnosis not present

## 2024-03-08 MED ORDER — POTASSIUM CHLORIDE CRYS ER 10 MEQ PO TBCR
10.0000 meq | EXTENDED_RELEASE_TABLET | Freq: Every day | ORAL | 3 refills | Status: AC
Start: 2024-03-08 — End: ?

## 2024-03-11 ENCOUNTER — Ambulatory Visit: Payer: Medicare Other | Admitting: Physical Therapy

## 2024-03-11 DIAGNOSIS — S61012D Laceration without foreign body of left thumb without damage to nail, subsequent encounter: Secondary | ICD-10-CM | POA: Diagnosis not present

## 2024-03-11 DIAGNOSIS — S51012D Laceration without foreign body of left elbow, subsequent encounter: Secondary | ICD-10-CM | POA: Diagnosis not present

## 2024-03-11 NOTE — Therapy (Incomplete)
 OUTPATIENT PHYSICAL THERAPY THORACOLUMBAR TREATMENT NOTE        Patient Name: Janet Nguyen MRN: [id number] DOB:1932-05-22, 89 y.o., female Today's Date: 03/11/2024  END OF SESSION:                Past Medical History:  Diagnosis Date   Arrhythmia    History of SVT with documented PVC'S and  PAC'S  12/08/12 Nuc stress test normal LV EF 74%  Event Monitor  12/01/12-01/03/13   Atrial flutter (HCC)    Celiac disease    treated by Dr. 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 Chaim Colony; pt states she was diagnosed 10-15 years ago, not treated at this time or any issues that she is aware of.   Scoliosis    Scoliosis    Sleep apnea 10/02/11 [facility]   Sleep study AHI -total sleep 10.3/hr  64.0/ hr during REM sleep.RDI 22.8/hr during total sleep 64.0/hr during REM sleep The lowest O2 sat during Non-REM and REM sleep was 86% and 88% respectively. 04/08/12 CPAP/BIPAP titration study [facility]   Past Surgical History:  Procedure Laterality Date   ANAL RECTAL MANOMETRY N/A 09/16/2023   Procedure: ANO RECTAL MANOMETRY;  Surgeon: Evangeline Hilts, MD;  Location: [facility] ENDOSCOPY;  Service: Gastroenterology;  Laterality: N/A;   APPENDECTOMY     ruptured at age 65 and had surgery   CARDIAC CATHETERIZATION  01/27/06   CARDIOVERSION N/A 01/08/2022   Procedure: CARDIOVERSION;  Surgeon: Sonny Dust, MD;  Location: [facility] ENDOSCOPY;  Service: Cardiovascular;  Laterality: N/A;   cataract surgery  2015   Dr. Lasandra Points; March & April 2015   Patient Active Problem List   Diagnosis Date Noted   Dyspnea 05/30/2023   Pleural effusion 05/30/2023   Radon exposure 05/30/2023   Secondary hypercoagulable state (HCC) 12/12/2021   Hyponatremia 12/04/2021    Acute CHF (congestive heart failure) (HCC) 12/02/2021   Chronic diastolic heart failure (HCC) 12/02/2021   Atrial fibrillation with rapid ventricular response (HCC) 12/02/2021   Essential hypertension 12/02/2021   GERD without esophagitis 12/02/2021   Obstructive sleep apnea 12/02/2021   Interstitial lung disease (HCC) 12/02/2021   History of pulmonary embolism 12/02/2021   Pulmonary embolism (HCC) 11/06/2021   Bronchiectasis (HCC) 11/06/2021   Deviated septum 05/08/2020   Mass of subcutaneous tissue 05/02/2020   Vertigo 06/16/2019   Pulmonary hypertension, unspecified (HCC) 04/14/2019   Ischemic colitis (HCC) 04/14/2019   Migraine with aura and without status migrainosus, not intractable 11/08/2018   Degeneration of lumbar intervertebral disc 10/14/2018   Hoarseness of voice 03/04/2018   Abdominal pain 07/01/2017   Diverticulitis, colon    Metabolic acidosis, increased anion gap    Constipation 04/14/2017   Lumbar hernia 04/14/2017   Presbycusis of both ears 01/10/2017   Tinnitus aurium, bilateral 01/10/2017   Gastroesophageal reflux disease 08/20/2016   Hemoptysis 08/20/2016   Obstructive sleep apnea of adult 08/20/2016   Rhinitis, chronic 08/20/2016   Throat pain in adult 08/20/2016   Fatigue 12/23/2015   Sciatica of right side 10/06/2015   History of migraine headaches 10/06/2015   Frequent PVCs 12/28/2013   Premature atrial contractions 12/28/2013   PSVT (paroxysmal supraventricular tachycardia) (HCC) 12/28/2013   Heart palpitations 07/13/2013  Sleep apnea 04/11/2013   Scoliosis 04/11/2013   Atypical atrial flutter (HCC) 11/29/2012   Chest pain, atypical 11/29/2012   Fibromyalgia syndrome 11/29/2012   Chronic steroid use 11/29/2012    PCP: Jimmey Mould, MD  REFERRING PROVIDER: Jimmey Mould, MD  REFERRING DIAG: M41.9 (ICD-10-CM) - Scoliosis  Rationale for Evaluation and Treatment: Rehabilitation  THERAPY DIAG:  No diagnosis found.  ONSET  DATE: chronic, years of progressive pain from degenerative scoliosis  SUBJECTIVE:                                                                                                                                                                                           SUBJECTIVE STATEMENT: My days are better because of PT.  PERTINENT HISTORY:  Degenerative scoliosis, complex medical history including pulmonary HTN, frequent PVCs and a-flutter, history of cardioversion, osteoporosis Has been SOB with activity On waitlist to get into cardiologist sooner   PAIN:  PAIN:  Are you having pain? Yes NPRS scale: 5/10 Pain location: abdomen, left side of trunk, low back, intermittent bil hips/buttock/knee, Rt foot pains, neck Pain orientation: Other: see above  PAIN TYPE: aching, sharp, and tight Pain description: intermittent and constant  Aggravating factors: varies Relieving factors: unsure   PRECAUTIONS: Other: osteoporosis  RED FLAGS: None   WEIGHT BEARING RESTRICTIONS: No  FALLS:  Has patient fallen in last 6 months? No  LIVING ENVIRONMENT: Lives with: lives with their family and lives alone Lives in: House/apartment Stairs: No Has following equipment at home: has cane and walker but does not use  OCCUPATION: retired  PLOF: Independent with basic ADLs, Independent with household mobility without device, Independent with community mobility without device, and Needs assistance with homemaking  PATIENT GOALS:  be able to eat, improve strength for bowel control, feel less pain and more energy to expand social outlets and walking  NEXT MD VISIT: as needed  OBJECTIVE:  Note: Objective measures were completed at Evaluation unless otherwise noted.  DIAGNOSTIC FINDINGS:    PATIENT SURVEYS:  PSEQ: 33/60 (<30 = low self-efficacy) Pain Catastrophizing Scale: 17 (>30 = clinically signif level of pain catastrophizing)  COGNITION: Overall cognitive status: intact with improved  outlook     SENSATION: WFL  MUSCLE LENGTH: 5/30: see palpation for updates 4/4 See palpation for updates Eval: Adaptive shortening of Lt obliques, QL, lat secondary to rigid scoliosis  POSTURE: rounded shoulders, forward head, decreased lumbar lordosis, and increased thoracic kyphosis, severe degenerative scoliosis with Lt SB/Rt Rot, left ribcage in near contact with left ilium  PALPATION: 5/30: Improved soft tissue extensibility along Lt side of trunk, improving lateral costal expansion  along Lt side of trunk, improving gut sounds and motility along Lt side of trunk, reducing trigger points in Lt lat and shoulder  4/4:  Improving mobility of soft tissues along Lt side of trunk allowing reduced pressure on bowels, bladder, stomach to allow improved bowel movements, control of bowel and bladder Improving lateral costal expansion along Lt side of trunk for improved depth of inhale and stretching of intercostals  Eval:  Spasm present in bil upper traps Fascial and muscle mobility restriction along Lt side of trunk due to adaptive shortening from scoliosis: obliques, QL, hip flexors, lat  LUMBAR ROM:   AROM eval 01/01/24 02/26/24  Flexion full full full  Extension NT NT NT  Right lateral flexion 20% 30% 40%  Left lateral flexion 40% 50% 75%  Right rotation 40% 50% 50%  Left rotation 20% 20% 30%   (Blank rows = not tested)  LOWER EXTREMITY ROM:    WFL, flexion is full with good hamstring length   LOWER EXTREMITY MMT:   4/4: hip abd and ER 4/5 Eval: Grossly 4/5 throughout, 4-/5 hip abduction, ER  FUNCTIONAL TESTS:    GAIT: Distance walked: within clinic Assistive device utilized: None Level of assistance: Modified independence Comments: short stride length  TREATMENT DATE:  03/11/24   03/04/24 Rt SL manual therapy - broadening and elongation of Lt obliques, QL, intercostals, paraspinals from cervical to lumbar, gluteals, hip flexors.  Gentle sacral distraction. Supine  core: bicycle, scissors, march alt LE x10 each Seated in chair posterior bil upper quadrant stripping, passive pec stretch bil Seated UE reach up and over to stretch Lt side of trunk 3x20 Seated UE horiz abd, row, shoulder ext x10 each Seated ball squeeze with PF lift 10x5  02/26/24 Seated UE reach up and over to stretch Lt side of trunk 3x20 while discussing status Seated UE horiz abd, row, shoulder ext, shoulder ER 2x10 each with red band Seated ball squeeze with PF lift 10x5, seated clam red loop 2x10 Sit to stand with ball squeeze and PF lift x5, then sit to stand with 2lb chest press  Seated in chair posterior bil upper quadrant stripping, passive pec stretch bil Rt SL manual therapy - broadening and elongation of Lt obliques, QL, intercostals, paraspinals from cervical to lumbar, gluteals, hip flexors.  Gentle sacral distraction.                                                                               PATIENT EDUCATION:  Education details: Pt education (see above) Person educated: Patient Education method: Explanation Education comprehension: verbalized understanding  HOME EXERCISE PROGRAM: Journaling to outline some activities to try Pelvic floor - select exercises from past HEP Supine LE stretches from past HEP  Access Code: [email or web address] URL: [email or web address] Date: 12/25/2023 Prepared by: Minor Amble Shloma Roggenkamp  Exercises - Sidelying Diaphragmatic Breathing  - 1 x daily - 7 x weekly - 1 sets - 10 reps - Seated Hip Abduction with Resistance  - 1 x daily - 7 x weekly - 2 sets - 10 reps - Standing Row with Anchored Resistance  - 1 x daily - 7 x weekly - 2 sets - 10 reps - Standing Shoulder Extension with Resistance  -  1 x daily - 7 x weekly - 2 sets - 10 reps  ASSESSMENT:  CLINICAL IMPRESSION: Pt reports she is tolerating all household, yard and errands with less pain having had more consistent PT.  She feels her outlook is improved as she is feeling more  pain control with consistent PT.  She demos improving mobility, exercise tolerance and soft tissue extensibility along Lt side of trunk.  She continues to have limited endurance secondary to severe degenerative scoliosis compressing Lt lung and diaphragm. She will continue to benefit from PT to manage pain and optimize systems affected severe compression from degenerative scoliosis (GI, cardiovascular, musculoskeletal).    OBJECTIVE IMPAIRMENTS: cardiopulmonary status limiting activity, decreased activity tolerance, decreased coordination, decreased endurance, decreased mobility, decreased ROM, decreased strength, hypomobility, increased fascial restrictions, increased muscle spasms, impaired flexibility, impaired tone, improper body mechanics, postural dysfunction, and pain.   ACTIVITY LIMITATIONS: carrying, lifting, bending, sitting, standing, squatting, sleeping, stairs, transfers, bed mobility, continence, bathing, dressing, and locomotion level  PARTICIPATION LIMITATIONS: meal prep, cleaning, laundry, shopping, and community activity  PERSONAL FACTORS: Age, Behavior pattern, Time since onset of injury/illness/exacerbation, and 1-2 comorbidities: pulmonary HTN, a-flutter, osteoporosis, severe degenerative scoliosis are also affecting patient's functional outcome.   REHAB POTENTIAL: Good  CLINICAL DECISION MAKING: Evolving/moderate complexity  EVALUATION COMPLEXITY: Moderate   GOALS: Goals reviewed with patient? Yes  SHORT TERM GOALS: Target date: 11/27/23  Pt will journal and discover at least 1 activity to return to that she enjoys Baseline: Goal status: met, watching movies with neighbors 3/7  2.  Pt will strive to eat consistent small meals throughout the day to initiate a bowel routine Baseline:  Goal status: met 4/4, most days  3.  Pt will report 20% less pain around abdomen and trunk to allow for comfort with small meals Baseline:  Goal status: met for most days  4/4    LONG TERM GOALS: Target date: 01/01/24  Pt will return to walking program at least 2x/week for multi-system health benefits. Baseline:  Goal status: will walk one lap around neighborhood 1-2x/day and participate in short errands  2.  Pt will be ind with HEP for gluteal and pelvic floor strength to reduce incidence of bowel leakage. Baseline:  Goal status: ongoing 5/30, notes about 25% less leakage  3.  Pt will improve score of PSEQ to at least 40 to demo improved self-efficacy within chronic pain. Baseline: 33 Goal status: ongoing  4.  Pt will establish nighttime routine including Curable app, medidation, breathing and stretching routine to improve sleep stretches. Baseline:  Goal status: MET 5/30, has established consistent routine  5.  Pt will report at least 50% less pain with daily tasks and eating secondary to reduced soft tissue compression surrounding scoliosis. Baseline:  Goal status: ONGOING 5/30, reports 25% improvement most days of the week    PLAN:  PT FREQUENCY: 1-2x/week  PT DURATION: 8 weeks  PLANNED INTERVENTIONS: 97110-Therapeutic exercises, 97530- Therapeutic activity, 97112- Neuromuscular re-education, 97535- Self Care, [id number]- Manual therapy, Dry Needling, Joint mobilization, Spinal mobilization, Cryotherapy, and Moist heat.  PLAN FOR NEXT SESSION: postural strength, incorporate PF education and training with therex, ambulation for endurance, manual therapy   Sabrena Gavitt, PT 03/11/24 7:56 AM

## 2024-03-14 ENCOUNTER — Other Ambulatory Visit (HOSPITAL_BASED_OUTPATIENT_CLINIC_OR_DEPARTMENT_OTHER): Payer: Self-pay | Admitting: Physician Assistant

## 2024-03-14 DIAGNOSIS — S0990XA Unspecified injury of head, initial encounter: Secondary | ICD-10-CM

## 2024-03-25 ENCOUNTER — Ambulatory Visit (HOSPITAL_BASED_OUTPATIENT_CLINIC_OR_DEPARTMENT_OTHER)
Admission: RE | Admit: 2024-03-25 | Discharge: 2024-03-25 | Disposition: A | Discharge status: Home / Self Care | Source: Ambulatory Visit | Attending: Physician Assistant | Admitting: Physician Assistant

## 2024-03-25 ENCOUNTER — Ambulatory Visit: Payer: Medicare Other | Admitting: Physical Therapy

## 2024-03-25 DIAGNOSIS — M542 Cervicalgia: Secondary | ICD-10-CM

## 2024-03-25 DIAGNOSIS — M62830 Muscle spasm of back: Secondary | ICD-10-CM

## 2024-03-25 DIAGNOSIS — S0990XA Unspecified injury of head, initial encounter: Secondary | ICD-10-CM | POA: Insufficient documentation

## 2024-03-25 DIAGNOSIS — Z043 Encounter for examination and observation following other accident: Secondary | ICD-10-CM | POA: Diagnosis not present

## 2024-03-25 DIAGNOSIS — G8929 Other chronic pain: Secondary | ICD-10-CM

## 2024-03-25 DIAGNOSIS — M4125 Other idiopathic scoliosis, thoracolumbar region: Secondary | ICD-10-CM

## 2024-03-25 DIAGNOSIS — M545 Low back pain, unspecified: Secondary | ICD-10-CM | POA: Diagnosis not present

## 2024-03-25 DIAGNOSIS — M546 Pain in thoracic spine: Secondary | ICD-10-CM

## 2024-03-25 DIAGNOSIS — R293 Abnormal posture: Secondary | ICD-10-CM

## 2024-03-25 DIAGNOSIS — R930 Abnormal findings on diagnostic imaging of skull and head, not elsewhere classified: Secondary | ICD-10-CM | POA: Diagnosis not present

## 2024-03-25 NOTE — Therapy (Signed)
 OUTPATIENT PHYSICAL THERAPY THORACOLUMBAR TREATMENT NOTE        Patient Name: Janet Nguyen MRN: [id number] DOB:11/26/1931, 89 y.o., female Today's Date: 03/25/2024  END OF SESSION:  PT End of Session - 03/25/24 1155     Visit Number 15    Date for PT Re-Evaluation 04/22/24    Authorization Type Medicare part A/B - KX at visit 15    Progress Note Due on Visit 17    PT Start Time 1100    PT Stop Time 1145    PT Time Calculation (min) 45 min    Activity Tolerance Patient tolerated treatment well    Behavior During Therapy [facility] for tasks assessed/performed                       Past Medical History:  Diagnosis Date   Arrhythmia    History of SVT with documented PVC'S and  PAC'S  12/08/12 Nuc stress test normal LV EF 74%  Event Monitor  12/01/12-01/03/13   Atrial flutter (HCC)    Celiac disease    treated by Dr. Luis   GERD (gastroesophageal reflux disease)    Hypertension    Intervertebral disc stenosis of neural canal of cervical region    Irregular heart beat 11/30/2012   ECHO-EF 60-65%   Osteoporosis    PMR (polymyalgia rheumatica) (HCC)    Dr. Balinda; pt states she was diagnosed 10-15 years ago, not treated at this time or any issues that she is aware of.   Scoliosis    Scoliosis    Sleep apnea 10/02/11 [facility]   Sleep study AHI -total sleep 10.3/hr  64.0/ hr during REM sleep.RDI 22.8/hr during total sleep 64.0/hr during REM sleep The lowest O2 sat during Non-REM and REM sleep was 86% and 88% respectively. 04/08/12 CPAP/BIPAP titration study [facility]   Past Surgical History:  Procedure Laterality Date   ANAL RECTAL MANOMETRY N/A 09/16/2023   Procedure: ANO RECTAL MANOMETRY;  Surgeon: Burnette Fallow, MD;  Location: [facility];  Service: Gastroenterology;  Laterality: N/A;   APPENDECTOMY     ruptured at age 7 and had surgery   CARDIAC CATHETERIZATION  01/27/06   CARDIOVERSION N/A 01/08/2022    Procedure: CARDIOVERSION;  Surgeon: Hobart Powell BRAVO, MD;  Location[facility];  Service: Cardiovascular;  Laterality: N/A;   cataract surgery  2015   Dr. Cleatus; March & April 2015   Patient Active Problem List   Diagnosis Date Noted   Dyspnea 05/30/2023   Pleural effusion 05/30/2023   Radon exposure 05/30/2023   Secondary hypercoagulable state (HCC) 12/12/2021   Hyponatremia 12/04/2021   Acute CHF (congestive heart failure) (HCC) 12/02/2021   Chronic diastolic heart failure (HCC) 12/02/2021   Atrial fibrillation with rapid ventricular response (HCC) 12/02/2021   Essential hypertension 12/02/2021   GERD without esophagitis 12/02/2021   Obstructive sleep apnea 12/02/2021   Interstitial lung disease (HCC) 12/02/2021   History of pulmonary embolism 12/02/2021   Pulmonary embolism (HCC) 11/06/2021   Bronchiectasis (HCC) 11/06/2021   Deviated septum 05/08/2020   Mass of subcutaneous tissue 05/02/2020   Vertigo 06/16/2019   Pulmonary hypertension, unspecified (HCC) 04/14/2019   Ischemic colitis (HCC) 04/14/2019   Migraine with aura and without status migrainosus, not intractable 11/08/2018   Degeneration of lumbar intervertebral disc 10/14/2018   Hoarseness of voice 03/04/2018   Abdominal pain 07/01/2017   Diverticulitis, colon    Metabolic acidosis, increased anion gap  Constipation 04/14/2017   Lumbar hernia 04/14/2017   Presbycusis of both ears 01/10/2017   Tinnitus aurium, bilateral 01/10/2017   Gastroesophageal reflux disease 08/20/2016   Hemoptysis 08/20/2016   Obstructive sleep apnea of adult 08/20/2016   Rhinitis, chronic 08/20/2016   Throat pain in adult 08/20/2016   Fatigue 12/23/2015   Sciatica of right side 10/06/2015   History of migraine headaches 10/06/2015   Frequent PVCs 12/28/2013   Premature atrial contractions 12/28/2013   PSVT (paroxysmal supraventricular tachycardia) (HCC) 12/28/2013   Heart palpitations 07/13/2013   Sleep apnea 04/11/2013    Scoliosis 04/11/2013   Atypical atrial flutter (HCC) 11/29/2012   Chest pain, atypical 11/29/2012   Fibromyalgia syndrome 11/29/2012   Chronic steroid use 11/29/2012    PCP: Okey Carlin Redbird, MD  REFERRING PROVIDER: Okey Carlin Redbird, MD  REFERRING DIAG: M41.9 (ICD-10-CM) - Scoliosis  Rationale for Evaluation and Treatment: Rehabilitation  THERAPY DIAG:  Abnormal posture  Cervicalgia  Other idiopathic scoliosis, thoracolumbar region  Pain in thoracic spine  Chronic bilateral low back pain without sciatica  Muscle spasm of back  ONSET DATE: chronic, years of progressive pain from degenerative scoliosis  SUBJECTIVE:                                                                                                                                                                                           SUBJECTIVE STATEMENT: I am achy all over and my neck has been bothering me more than usual since hosting my 89th bday party.  PERTINENT HISTORY:  Degenerative scoliosis, complex medical history including pulmonary HTN, frequent PVCs and a-flutter, history of cardioversion, osteoporosis Has been SOB with activity On waitlist to get into cardiologist sooner   PAIN:  PAIN:  Are you having pain? Yes NPRS scale: 7/10 Pain location: abdomen, left side of trunk, low back, intermittent bil hips/buttock/knee, Rt foot pains, neck Pain orientation: Other: see above  PAIN TYPE: aching, sharp, and tight Pain description: intermittent and constant  Aggravating factors: varies Relieving factors: unsure   PRECAUTIONS: Other: osteoporosis  RED FLAGS: None   WEIGHT BEARING RESTRICTIONS: No  FALLS:  Has patient fallen in last 6 months? No  LIVING ENVIRONMENT: Lives with: lives with their family and lives alone Lives in: House/apartment Stairs: No Has following equipment at home: has cane and walker but does not use  OCCUPATION: retired  PLOF: Independent with basic ADLs,  Independent with household mobility without device, Independent with community mobility without device, and Needs assistance with homemaking  PATIENT GOALS:  be able to eat, improve strength for bowel control, feel less pain and more energy to expand social  outlets and walking  NEXT MD VISIT: as needed  OBJECTIVE:  Note: Objective measures were completed at Evaluation unless otherwise noted.  DIAGNOSTIC FINDINGS:    PATIENT SURVEYS:  PSEQ: 33/60 (<30 = low self-efficacy) Pain Catastrophizing Scale: 17 (>30 = clinically signif level of pain catastrophizing)  COGNITION: Overall cognitive status: intact with improved outlook     SENSATION: WFL  MUSCLE LENGTH: 5/30: see palpation for updates 4/4 See palpation for updates Eval: Adaptive shortening of Lt obliques, QL, lat secondary to rigid scoliosis  POSTURE: rounded shoulders, forward head, decreased lumbar lordosis, and increased thoracic kyphosis, severe degenerative scoliosis with Lt SB/Rt Rot, left ribcage in near contact with left ilium  PALPATION: 5/30: Improved soft tissue extensibility along Lt side of trunk, improving lateral costal expansion along Lt side of trunk, improving gut sounds and motility along Lt side of trunk, reducing trigger points in Lt lat and shoulder  4/4:  Improving mobility of soft tissues along Lt side of trunk allowing reduced pressure on bowels, bladder, stomach to allow improved bowel movements, control of bowel and bladder Improving lateral costal expansion along Lt side of trunk for improved depth of inhale and stretching of intercostals  Eval:  Spasm present in bil upper traps Fascial and muscle mobility restriction along Lt side of trunk due to adaptive shortening from scoliosis: obliques, QL, hip flexors, lat  LUMBAR ROM:   AROM eval 01/01/24 02/26/24  Flexion full full full  Extension NT NT NT  Right lateral flexion 20% 30% 40%  Left lateral flexion 40% 50% 75%  Right rotation 40% 50%  50%  Left rotation 20% 20% 30%   (Blank rows = not tested)  LOWER EXTREMITY ROM:    WFL, flexion is full with good hamstring length   LOWER EXTREMITY MMT:   4/4: hip abd and ER 4/5 Eval: Grossly 4/5 throughout, 4-/5 hip abduction, ER  FUNCTIONAL TESTS:    GAIT: Distance walked: within clinic Assistive device utilized: None Level of assistance: Modified independence Comments: short stride length  TREATMENT DATE:  03/11/24 Rt SL manual therapy - broadening and elongation of Lt obliques, QL, intercostals, paraspinals from cervical to lumbar, gluteals, hip flexors.  Gentle sacral distraction. Supine core: bicycle, scissors, march alt LE x10 each Seated in chair posterior bil upper quadrant stripping, passive pec stretch bil Seated UE reach up and over to stretch Lt side of trunk 3x20 Seated UE horiz abd, row, shoulder ext x10 each Seated ball squeeze with PF lift 10x5  03/04/24 Rt SL manual therapy - broadening and elongation of Lt obliques, QL, intercostals, paraspinals from cervical to lumbar, gluteals, hip flexors.  Gentle sacral distraction. Supine core: bicycle, scissors, march alt LE x10 each Seated in chair posterior bil upper quadrant stripping, passive pec stretch bil Seated UE reach up and over to stretch Lt side of trunk 3x20 Seated UE horiz abd, row, shoulder ext x10 each Seated ball squeeze with PF lift 10x5  02/26/24 Seated UE reach up and over to stretch Lt side of trunk 3x20 while discussing status Seated UE horiz abd, row, shoulder ext, shoulder ER 2x10 each with red band Seated ball squeeze with PF lift 10x5, seated clam red loop 2x10 Sit to stand with ball squeeze and PF lift x5, then sit to stand with 2lb chest press  Seated in chair posterior bil upper quadrant stripping, passive pec stretch bil Rt SL manual therapy - broadening and elongation of Lt obliques, QL, intercostals, paraspinals from cervical to lumbar, gluteals, hip flexors.  Gentle sacral  distraction.                                                                               PATIENT EDUCATION:  Education details: Pt education (see above) Person educated: Patient Education method: Explanation Education comprehension: verbalized understanding  HOME EXERCISE PROGRAM: Journaling to outline some activities to try Pelvic floor - select exercises from past HEP Supine LE stretches from past HEP  Access Code: [email or web address] URL: [email or web address] Date: 12/25/2023 Prepared by: Orvil Fantasha Daniele  Exercises - Sidelying Diaphragmatic Breathing  - 1 x daily - 7 x weekly - 1 sets - 10 reps - Seated Hip Abduction with Resistance  - 1 x daily - 7 x weekly - 2 sets - 10 reps - Standing Row with Anchored Resistance  - 1 x daily - 7 x weekly - 2 sets - 10 reps - Standing Shoulder Extension with Resistance  - 1 x daily - 7 x weekly - 2 sets - 10 reps  ASSESSMENT:  CLINICAL IMPRESSION: Pt more achy today having hosted a 89th bday party at her house.  She has needed more heat and ice (alternating) for her neck since last weekend.  More manual time spent today on her neck to address soft tissue tension.  Pt reports she is tolerating all household, yard and errands with less pain having had more consistent PT.  She feels her outlook is improved as she is feeling more pain control with consistent PT.  She demos improving mobility, exercise tolerance and soft tissue extensibility along Lt side of trunk.  She continues to have limited endurance secondary to severe degenerative scoliosis compressing Lt lung and diaphragm. She will continue to benefit from PT to manage pain and optimize systems affected severe compression from degenerative scoliosis (GI, cardiovascular, musculoskeletal).    OBJECTIVE IMPAIRMENTS: cardiopulmonary status limiting activity, decreased activity tolerance, decreased coordination, decreased endurance, decreased mobility, decreased ROM, decreased strength,  hypomobility, increased fascial restrictions, increased muscle spasms, impaired flexibility, impaired tone, improper body mechanics, postural dysfunction, and pain.   ACTIVITY LIMITATIONS: carrying, lifting, bending, sitting, standing, squatting, sleeping, stairs, transfers, bed mobility, continence, bathing, dressing, and locomotion level  PARTICIPATION LIMITATIONS: meal prep, cleaning, laundry, shopping, and community activity  PERSONAL FACTORS: Age, Behavior pattern, Time since onset of injury/illness/exacerbation, and 1-2 comorbidities: pulmonary HTN, a-flutter, osteoporosis, severe degenerative scoliosis are also affecting patient's functional outcome.   REHAB POTENTIAL: Good  CLINICAL DECISION MAKING: Evolving/moderate complexity  EVALUATION COMPLEXITY: Moderate   GOALS: Goals reviewed with patient? Yes  SHORT TERM GOALS: Target date: 11/27/23  Pt will journal and discover at least 1 activity to return to that she enjoys Baseline: Goal status: met, watching movies with neighbors 3/7  2.  Pt will strive to eat consistent small meals throughout the day to initiate a bowel routine Baseline:  Goal status: met 4/4, most days  3.  Pt will report 20% less pain around abdomen and trunk to allow for comfort with small meals Baseline:  Goal status: met for most days 4/4    LONG TERM GOALS: Target date: 01/01/24  Pt will return to walking program at least 2x/week for multi-system health benefits. Baseline:  Goal status: will walk  one lap around neighborhood 1-2x/day and participate in short errands  2.  Pt will be ind with HEP for gluteal and pelvic floor strength to reduce incidence of bowel leakage. Baseline:  Goal status: ongoing 5/30, notes about 25% less leakage  3.  Pt will improve score of PSEQ to at least 40 to demo improved self-efficacy within chronic pain. Baseline: 33 Goal status: ongoing  4.  Pt will establish nighttime routine including [employer] app, medidation,  breathing and stretching routine to improve sleep stretches. Baseline:  Goal status: MET 5/30, has established consistent routine  5.  Pt will report at least 50% less pain with daily tasks and eating secondary to reduced soft tissue compression surrounding scoliosis. Baseline:  Goal status: ONGOING 5/30, reports 25% improvement most days of the week    PLAN:  PT FREQUENCY: 1-2x/week  PT DURATION: 8 weeks  PLANNED INTERVENTIONS: 97110-Therapeutic exercises, 97530- Therapeutic activity, 97112- Neuromuscular re-education, 97535- Self Care, [id number]- Manual therapy, Dry Needling, Joint mobilization, Spinal mobilization, Cryotherapy, and Moist heat.  PLAN FOR NEXT SESSION: postural strength, incorporate PF education and training with therex, ambulation for endurance, manual therapy  Lavon Bothwell, PT 03/25/24 12:02 PM

## 2024-03-29 ENCOUNTER — Encounter: Payer: Self-pay | Admitting: Internal Medicine

## 2024-03-29 ENCOUNTER — Ambulatory Visit (INDEPENDENT_AMBULATORY_CARE_PROVIDER_SITE_OTHER): Admitting: Internal Medicine

## 2024-03-29 VITALS — BP 122/76 | HR 80 | Ht 60.0 in | Wt 114.8 lb

## 2024-03-29 DIAGNOSIS — M4125 Other idiopathic scoliosis, thoracolumbar region: Secondary | ICD-10-CM

## 2024-03-29 DIAGNOSIS — R0609 Other forms of dyspnea: Secondary | ICD-10-CM | POA: Diagnosis not present

## 2024-03-29 DIAGNOSIS — Z758 Other problems related to medical facilities and other health care: Secondary | ICD-10-CM

## 2024-03-29 NOTE — Progress Notes (Signed)
 OV 10/22/2021  Subjective:  Patient ID: Janet Nguyen, female , DOB: 12-03-1931 , age 89 y.o. , MRN: [id number] , ADDRESS: [address] [place] [address] PCP Okey Carlin Redbird, MD Patient Care Team: Okey Carlin Redbird, MD as PCP - General (Family Medicine) Burnard Debby LABOR, MD as PCP - Cardiology (Cardiology)  This Provider for this visit: Treatment Team:  Attending Provider: Geronimo Amel, MD    10/22/2021 -   Chief Complaint  Patient presents with   Consult    Pt had a recent CT performed which is the reason for today's visit.     HPI Janet Nguyen 89 y.o. -referred for shortness of breath and cough.  Has known severe scoliosis.  Found to have bronchiectasis on the CT chest.  History is provided by the patient and review of the medical records.  She is a very good historian.  She is divorced.  She has grown children who all live all over the country.  She has several grandchildren.  She says she is known to have deviated nasal septum, celiac disease for which she is on a restrictive diet, sleep apnea but she cannot tolerate CPAP.  She is most importantly known to have severe scoliosis that was first diagnosed in her younger years.  She says after age 73 is around when they made the diagnosis although she suspect she has had a longer period she is just on observation treatment.  She did yoga and severe exercise to keep herself physically fit.  Over the years scoliosis is gotten worse that she is lost 6 inches in total height.  This is producing physical pressure effects.  She thinks with the onset of the pandemic and particularly with worsening of the scoliosis and particularly for the last few months or so she said dyspnea on exertion [echo did show grade 2 diastolic dysfunction], decreased socializing, decreased sleep, feeling depressed.  Also she feels her migraines are worse.  She when she wakes up in the morning she feels an elephant is on the chest.  She  also has a cough.  The respiratory symptoms are mild overall.  But she does definitely have it and is definitely new.  Sometime back a year ago or 2 years ago she is to be able to walk at least a mile now she gets dyspneic walking a block.  Of note she reports a few episodes of severe vomiting acute episodes 2 times in 2022.  Idiopathic.  There is associated heartburn  She has had the COVID-vaccine but does not have the COVID itself  Review of system positive for fatigue for the last several months arthralgia for the last several years.  Denies any family history of lung disease  Denies any substance abuse  Lives in a 88 year old home.  Detailed review shows no organic dust antigen exposure.  She does have a bird feeder.  Occupational history: Detail organic and inorganic antigen exposure history is negative    CT Chest data 10/09/21 -    IMPRESSION: 1. 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 Chronic aspiration could have a similar appearance. 2. Part solid nodule of the right lower lobe measuring 7.5 mm in mean diameter with 4 mm solid component. Follow-up non-contrast CT recommended at 3-6 months to confirm persistence. If unchanged, and  solid component remains <6 mm, annual CT is recommended until 5 years of stability has been established. If persistent these nodules should be considered highly suspicious if the solid component of the nodule is 6 mm or greater in size and enlarging. This recommendation follows the consensus statement: Guidelines for Management of Incidental Pulmonary Nodules Detected on CT Images: From the Fleischner Society 2017; Radiology 2017; 284:228-243. 3.  Aortic Atherosclerosis (ICD10-I70.0).     Electronically Signed   By: Rea Marc M.D.    On: 10/09/2021 15:34  No results found.   11/06/2021 Follow up : PE   89 yo female never smoker seen for pulmonary consult 10/22/21 for bronchiectasis and shortness of breath found to have Pulmonary embolism on VQ scan .  Medical history of severe scoliosis /kyphosis , OSA -CPAP intolerant   TEST/EVENTS :  VQ scan 10/25/21- multiple moderate perfusion defects with RML/RLL + PE.  Venous Doppler 09/2021 neg  Echo Nml EF , Gr 2 DD, Elevated PAP . Severe LAE and Mod RA, mod to severe MR   Patient returns for a 1 week follow up . Recently seen for pulmonary consult for bronchiectasis and shortness of breath. Labs showed elevated  D Dimer . Subsequent VQ scan on 10/25/21 was positive for PE. 2 D echo showed mild elevated PAP at , Gr 2 DD , Severe LAE and Mod RA, mod to severe MR. Venous doppler was negative for DVT.  Has some minimally productive dry cough. Was started on Spiriva  but did not take.  She was started on Eliquis  , she has started on starter pack .  Unfortunately patient's insurance does not cover and prescription is greater than $600.  We discussed patient assistance program.  We have also contacted our pharmacy department to help us  with alternative options.  Patient education on anticoagulation therapy.  Patient says she has some minimum daily cough.  She was started on Spiriva  last visit.  Unfortunately did not start this.  We discussed the reason for Spiriva .  Went over her recent CT chest that shows some bronchiectasis.  Autoimmune labs did show elevated rheumatoid factor and ANA, CCP was negative.  Patient has some mild arthritis but no significant joint swelling.  Sjogren's panel was negative. Typically active with light walking. Limited with back pain with scoliosis.  Lives alone. Drives. Does light housework , shopping. Has 4 kids, lots of grandkids.   11/22/2021 Follow Up PE/ Bronchiectasis/ Exertional dyspnea  Janet Nguyen is a 89 y.o. female never smoker seen  for pulmonary consult 10/22/21 for bronchiectasis and shortness of breath. Additionally she was  found to have Pulmonary embolism on VQ scan . PE is unprovoked.  May need to consider lifelong anticoagulation or at least for 6 months to 1 year of therapy.  Will need to weigh the risk and benefit ratio.She is followed by Dr. Geronimo   Pt. Presents for follow up . She states she has been doing well. She has been compliant with her  Eliquis . No bleeding.  She does not use NSAIDS. We are working on getting her reduced cost medication through the pharmacy. She understands she cannot skip a dose of Eliquis . .We reviewed bleeding precautions and safe use of sharp objects, in addition to fall precautions. She denies any lower extremity swelling.No bruising noted  She does continue to have exertional dyspnea.  She states she has been compliant with her Spiriva . She states she is unsure if it is helping or not, but is willing to  try this for a bit longer.    She is using her flutter valve 8-10 times a day. 2-4 blows at a time.She states she has not been able to cough anything up. We will add Mucinex to see if this helps. We discussed that this is a daily maintenance for her bronchiectasis. She is very compliant. She has had many questions today, that we have addressed.    PFT's look restrictive, which is expected with her degree of scoliosis.  DLCO with mild/Moderate reduction  Lung volumes are slightly reduce  OV 11/28/2021  Subjective:  Patient ID: Janet Nguyen, female , DOB: 10/31/1931 , age 55 y.o. , MRN: [id number] , ADDRESS: [address] [place] [address] PCP Okey Carlin Redbird, MD Patient Care Team: Okey Carlin Redbird, MD as PCP - General (Family Medicine) Burnard Debby LABOR, MD as PCP - Cardiology (Cardiology)  This Provider for this visit: Treatment Team:  Attending Provider: Geronimo Amel, MD    11/28/2021 -   Chief Complaint  Patient presents with   Follow-up    Pt  states she has been having problems with the cost of the Eliquis  medication. States she took her last pill today 3/2. States that she has been more SOB than before.   #Multifactorial dyspnea  -Scoliosis, bronchiectasis seen on CT scan, mitral valve regurgitation grade 2 diastolic dysfunction and pulmonary embolism January 2023   #pulm embolism diagnosed January 2023 through VQ scan and high D-dimer  -On outpatient Eliquis   #Bronchiectasis diagnosis given genera 2023  -On Spiriva  and flutter valve  #Right lower lobe lung nodule new 7.5 mm January 2023  -On surveillance  #Positive rheumatoid factor new diagnosis January 2023  -On surveillance   HPI NAVEYA ELLERMAN 89 y.o. -returns for follow-up.  I saw her in January 2023.  At that time diagnosis of pulmonary embolism was made.  She is here for follow-up.  Since then she is seen nurse practitioner.  She is taking Eliquis .  Recently today she is her last day of Eliquis .  She needs to pay for Eliquis .  It is over $500.  She is struggle to come to this decision.  Pharmacy team has been in communication with me.  We had a conversation.  At first she said she did not want to pay for the Eliquis .  It appears to have done extensive research and only warfarin would be cheap and affordable.  We went through the relative risk can inconvenience features between DOAC and Warfarin.  She then decided to go with DOAC and making the more expensive payment.  She then also discussed the difference between Eliquis  and Xarelto.  She believes Xarelto could be risky compared to Eliquis .  So she decided to stick with Eliquis  but she asked the pharmacy team to investigate and give her an updated information.    She does have other complaints.  She is concerned about her carotid artery.  In 2017 she might of had some mild carotid stenosis according to the ultrasound report.  I asked her to follow-up with primary care physician.  She wanted to discuss cardiac issues of  mitral valve regurgitation and grade 2 diastolic dysfunction.  Have asked her to discuss this with Dr. Burnard but did indicate that it is contributing to shortness of breath.  She has positive rheumatoid factor and we said we will address it at a future visit.  She does have lung nodules and she is due for a follow-up CT in the spring  2023.  She has bronchiectasis and she said the Spiriva  and flutter valve might not be helping but for now she is going to continue those.    CT Chest data  No results found.       OV 01/24/2022  Subjective:  Patient ID: Janet Nguyen, female , DOB: 02-21-1932 , age 89 y.o. , MRN: [id number] , ADDRESS: [address] [place] [address] PCP Okey Carlin Redbird, MD Patient Care Team: Okey Carlin Redbird, MD as PCP - General (Family Medicine) Burnard Debby LABOR, MD as PCP - Cardiology (Cardiology)  This Provider for this visit: Treatment Team:  Attending Provider: Geronimo Amel, MD    01/24/2022 -   Chief Complaint  Patient presents with   Follow-up    Pt states she has been doing okay since last visit. States she has been in and out of the hospital since last visit.    #Multifactorial dyspnea  -Scoliosis, bronchiectasis seen on CT scan, mitral valve regurgitation grade 2 diastolic dysfunction, atrial flutter and pulmonary embolism January 2023   #pulm embolism diagnosed January 2023 through VQ scan and high D-dimer  -On outpatient Eliquis   #Bronchiectasis diagnosis given genera 2023  -On Spiriva  and flutter valve  #Right lower lobe lung nodule new 7.5 mm January 2023  -On surveillance  #Positive rheumatoid factor new diagnosis January 2023  -On surveillance  HPI Janet Nguyen 89 y.o. -returns for follow-up.  She continues on Eliquis .  She is tolerating it well.  Issues that it is very expensive she is spending more than $5 per month.  She is going to work with [employer] company trying to reduce the cost.  I talked about  limiting the Eliquis  to 6 months and then going on baby aspirin  based on D-dimer.  However she says she wants to work with [employer] and try to reduce the cost.  She prefers to take Eliquis  as long as medically indicated.  Shortness of breath wise she is stable.  She recently was admitted for diastolic dysfunction heart failure and atrial flutter in March 2023.  I reviewed the chart.  This followed a 10 pound weight gain.  She is back to feeling baseline.    Regarding her rheumatoid factor positivity: She says she does not have much of morning stiffness and joint mobility is pretty good therefore she wants to hold off on seeing rheumatologist.    Regarding emphysem and mild bronchiectasis: She is again not using the Spiriva  and flutter valve.  She again tells us  that she has not been instructed clearly.  We actually had her for a second time and had to give instructions.  She does not recommend this.  The CMA went in and showed her the devices Spiriva  Respimat.  Then she remembered it.  We gave her instructions again.  She wants to try empiric Spiriva  for a time-limited trial along with flutter valve and then see if this helps her shortness of breath  Lung nodule: She has not had a 69-month CT and April 2023.  Radiologist recommended endograft 3-40-month so we will get it accordingly.        OV 04/17/2022  Subjective:  Patient ID: Janet Nguyen, female , DOB: June 30, 1932 , age 82 y.o. , MRN: [id number] , ADDRESS: [address] [place] [address] PCP Okey Carlin Redbird, MD Patient Care Team: Okey Carlin Redbird, MD as PCP - General (Family Medicine) Burnard Debby LABOR, MD as PCP - Cardiology (Cardiology)  This  Provider for this visit: Treatment Team:  Attending Provider: Geronimo Amel, MD   04/17/2022 -   Chief Complaint  Patient presents with   Follow-up    Patient here to go over CT results.      HPI Janet Nguyen 89 y.o. -returns for follow-up.  She  continues on full dose Eliquis .  She is complaining about the cost of Eliquis .  She is wondering if this can be reduced.  She has completed 6 months of full dose treatment for pulmonary embolism.  However in the interim she has atrial fibrillation and she is on Eliquis  for that.  I have return to Dr. Burnard about this.  However she is continuing to have significant amount of symptoms.  She feels frustrated.  For.  She feels that nobody is listening to her and helping her address her symptoms.  However records indicate otherwise.  She also tells me that in terms of   #shortness of breath -this is not worse after the cardioversion.  She is unable to walk a few feet.  Particularly after April 2023.  Her weight was 113 pounds in April currently at 116 pounds but the weight fluctuates she states.  Also in the last 1 month  #Worsening pedal edema.  She is on Lasix  and this is despite that.  She has significant varicose veins.  #Nonspecific side effects of Eliquis  apart from being expensive she feels that she is bleeding all over and she shows a body but there is no bruising or anything.  She pointed out to some discrete small macular heads of skin lesions and she says it is because of Eliquis .  #Sinus says she feels this is acting up.  She has palpitations.  She also has irritable bowel syndrome.  #Bronchiectasis she is already using a flutter valve.  She states we did not educate her currently.  Records show otherwise.  She is also not using her Spiriva .  She says she ran out.  #Lung nodule: She had CT scan of the chest shows the lung nodules resolved/very small.    CT Chest data 04/09/2022  Narrative & Impression  CLINICAL DATA:  Lung nodule.   EXAM: CT CHEST WITHOUT CONTRAST   TECHNIQUE: Multidetector CT imaging of the chest was performed following the standard protocol without IV contrast.   RADIATION DOSE REDUCTION: This exam was performed according to the departmental dose-optimization  program which includes automated exposure control, adjustment of the mA and/or kV according to patient size and/or use of iterative reconstruction technique.   COMPARISON:  10/09/2021, 07/01/2017.   FINDINGS: Cardiovascular: Atherosclerotic calcification of the aorta, aortic valve and coronary arteries. Enlarged pulmonic trunk and heart. No pericardial effusion.   Mediastinum/Nodes: Mediastinal lymph nodes are not enlarged by CT size criteria and appear similar to prior. Hilar regions are difficult to definitively evaluate without IV contrast. No axillary adenopathy. Esophagus is grossly unremarkable.   Lungs/Pleura: Scattered mucoid impaction. Central bronchiectasis. Scarring in the right middle lobe and lingula. New bilateral pleural effusions, moderate to large on the right and small to moderate on the left. Patchy ground-glass in the adjacent right lower lobe. Basilar septal thickening. Pulmonary nodules measure 4 mm or less in size, as on 10/09/2021 and likely benign. Previously described part solid nodule in right lower lobe is not visualized. Airway is unremarkable.   Upper Abdomen: Visualized portions of the liver and right adrenal gland are unremarkable. Left adrenal thickening, as before, no follow-up necessary. 1.8 cm fluid density lesion in  the upper pole left kidney, indicative of a cyst. No follow-up necessary. Visualized portions of the spleen, pancreas and stomach are unremarkable. Left lumbar hernia contains unobstructed colon.   Musculoskeletal: Degenerative changes in the spine with scoliosis. No worrisome lytic or sclerotic lesions.   IMPRESSION: 1. Previously described part solid right lower lobe nodule is not visualized. 2. Congestive heart failure. 3. Aortic atherosclerosis (ICD10-I70.0). Coronary artery calcification. 4. Enlarged pulmonic trunk, indicative of pulmonary arterial hypertension.     Electronically Signed   By: Newell Eke M.D.    On: 04/10/2022 15:07      No results found.   OV 07/21/2022  Subjective:  Patient ID: Janet Nguyen, female , DOB: 1932-03-29 , age 32 y.o. , MRN: [id number] , ADDRESS: [address] [place] [address] PCP Okey Carlin Redbird, MD Patient Care Team: Okey Carlin Redbird, MD as PCP - General (Family Medicine) Burnard Debby LABOR, MD as PCP - Cardiology (Cardiology) Duke, Jon Garre, PA as Physician Assistant (Cardiology)  This Provider for this visit: Treatment Team:  Attending Provider: Geronimo Amel, MD    07/21/2022 -   Chief Complaint  Patient presents with   Follow-up    PT wants instructions on how to use flutter and inhaler States itchy skin, swollen legs, SOB for months Brought list of problems to discuss with Provider    HPI Janet Nguyen 89 y.o. -returns for follow-up.  In this visit she is frustrated by all her health issues.  She is frustrated that doctors are not like the way it used to be many years ago.  She feels her problems are not being addressed.  She feels she does not have a good handle on her health issues because of lack of clear-cut communication from healthcare providers.  I did remind her that all the issues have been addressed and talk to her.  She had a list of questions all of which she asked in the past and we were answered.  I did wonder about that.  It appears the fact she lives alone is causing excess distress to her because she is 89 years old.  She acknowledged that when asked her about it.  She has 2 children living in [place] but it appears that the relationship may not be tight.  There is a daughter who is nearing retirement living in [place]  which checks in on her more frequently.  However the son in [place] is the healthcare power of attorney.  They have never come for the visit but she says she has appraised them.  Her current symptoms are listed below.  I told her that I believe she is  currently at baseline and the best way forward is not expected to go of her condition but she needs to learn to accept what Is permanent baseline symptoms and the fact some of the symptoms might not be reversible.  She needs to learn to recognize variation and then report on the variation.  Based on that she fill out the [facility] symptom assessment score.  Also gave her a sheet to take home so she can monitor her status.  In terms of her shortness of breath this is stable  In terms of anticoagulation she is taking Eliquis .  It is now at low-dose in September 2023.  She wants to know if she can stop it.  I told her January 2024 it will be a year since pulmonary embolism and she can probably stop it but  she has atrial flutter and therefore the long-term decision will now depend on Dr. Burnard  She is worried about recurrent epistaxis because of the Eliquis .  She had severe epistaxis many years ago and ended up in the emergency room.  Some weeks ago she had 1 small bout of epistaxis versus oral bleeding.  She brought a pillow covered to show that.  Since then it has not recurred.  I told her if it recurs she can call us  back or Dr. Burnard.   In terms of her pedal edema: The right lower extremity is more swollen than the left.  Did explain to her the multifactorial nature of this.  She is not interested in getting a Doppler of the leg.  It is better than before and therefore we opted to watch this   Lung nodule: She had a previous lung nodule that resolved.  She did find out the area where she has been living for 34 years there is increased rate on exposure.  At this point in time we resolved just to monitor this.       OV 10/30/2022  Subjective:  Patient ID: Janet Nguyen, female , DOB: 17-Apr-1932 , age 17 y.o. , MRN: [id number] , ADDRESS: [address] [place] [address] PCP Okey Carlin Redbird, MD Patient Care Team: Okey Carlin Redbird, MD as PCP - General (Family Medicine) Burnard Debby LABOR, MD as PCP - Cardiology (Cardiology) Duke, Jon Garre, PA as Physician Assistant (Cardiology)  This Provider for this visit: Treatment Team:  Attending Provider: Geronimo Amel, MD    10/30/2022 -   Chief Complaint  Patient presents with   Follow-up    C/o R lower leg and foot swelling.  Thinks Eliquis  is causing this.  SOB has improved over last year.  Does not remember ever taking Spiriva  Respimat.     HPI Janet Nguyen 89 y.o. -returns for follow-up.  She tells me that she is actually doing better.  She is completed 1 year of Eliquis  with low-dose in September 2023.  All her EKGs on external record review with Dr. Debby Burnard since August 2023 through December 2023 still show atrial fibrillation.  Her most recent visit with Dr. Burnard was in December 2023 and she still had atrial fibrillation.  She really wants to come off the Eliquis .  She is completed 1 year for a pulmonary embolism treatment but she now needs it for her atrial fibrillation.  I did indicate to her that she can stop her Eliquis  from a pulmonary embolism standpoint but she needs it for atrial fibrillation because of stroke risk and she will have to discuss this with Dr. Burnard.  I did indicate to her that she is only on a low-dose but she feels there is bruising on her back and easy bruising.  She showed me some pictures.  She believes it is coming from Eliquis .  I have deferred the indication for Eliquis  to Dr. Debby Burnard.  From a shortness of breath standpoint she is much better she just gets fatigued because of aging she feels.  There is no cough or wheezing.  She is only on supportive care.  She has bronchiectasis but does not do Spiriva .  She feels it is at risk for cancer but we have kept CT scan follow-up on an expectant approach.  Because she is feeling better she actually wants to switch to expectant follow-up.  I am supportive of this.  She did appreciate me for  the care.  She also giving feedback to  have actually improved with my bedside manners in the last 1 year.  She feels physicians need to spend more time.  We are seeing her 30-minute slots and this has helped her outpatient satisfaction.       OV 03/02/2023  Subjective:  Patient ID: Janet Nguyen, female , DOB: 1931/10/04 , age 83 y.o. , MRN: [id number] , ADDRESS: [address] [place] [address] PCP Okey Carlin Redbird, MD Patient Care Team: Okey Carlin Redbird, MD as PCP - General (Family Medicine) Burnard Debby LABOR, MD as PCP - Cardiology (Cardiology) Duke, Jon Garre, PA as Physician Assistant (Cardiology)  This Provider for this visit: Treatment Team:  Attending Provider: Geronimo Amel, MD    03/02/2023 -   Chief Complaint  Patient presents with   Follow-up    F/up sob, chest tightness, pain, cough, wheezing.     HPI Janet Nguyen 89 y.o. -returns for follow-up.  At the last visit she discharged herself from follow-up.  It appears that some of the physician presumably primary care physician got a CT scan of the chest.  This showed persistence of the pleural effusions and therefore she is here for follow-up.  She tells me that overall she is doing poorly.  She says ever since she started Eliquis  she is having punctate lesions in her skin that look like bug bites.  She feels that because of the Eliquis  but she is tolerating it and taking the Eliquis  because of atrial fibrillation.  Overall she feels she has less energy.  In fact on the [place] symptom assessment scale energy scores are slightly worse.  But more importantly she has worsening shortness of breath.  Definitely shortness of breath scores are worse.  However other symptoms are slightly better.  She also feels that her lower extremity edema is worse [she has significant varicose veins].  She is on torsemide .  She is seen cardiology.  Along with her shortness of breath is some wheezing as well.  She is unable to do groceries.  She is  frustrated by all this.  Social - She continues to live alone.  There is a son from [place]  visits to every other month and does some cooking for her.  There is a son who lives locally and is retired.  She told me today that she is of English descent.  Her parents were born around [place].  All her cousins lived in [place].  Her parents immigrated to the [place]  in the 3s.  Other issues - She has anorectal prolapse.  We talked about pleural effusion.  She wondered if these things are related.  Answered in the negative.   CT Chest data 02/20/23   Narrative & Impression  CLINICAL DATA:  Follow up abnormal findings on prior imaging test. Multiple complaints. No given history of malignancy.   EXAM: CT CHEST WITHOUT CONTRAST   TECHNIQUE: Multidetector CT imaging of the chest was performed following the standard protocol without IV contrast.   RADIATION DOSE REDUCTION: This exam was performed according to the departmental dose-optimization program which includes automated exposure control, adjustment of the mA and/or kV according to patient size and/or use of iterative reconstruction technique.   COMPARISON:  Chest CT 04/09/2022 and 10/09/2021. Radiographs 12/02/2021.   FINDINGS: Cardiovascular: Atherosclerosis of the aorta, great vessels and coronary arteries, similar to previous study. There are aortic valvular calcifications. Stable mild cardiomegaly and central enlargement of the pulmonary arteries. No  significant pericardial effusion.   Mediastinum/Nodes: There are no enlarged mediastinal, hilar or axillary lymph nodes.Scattered small mediastinal lymph nodes are similar to the previous study. Hilar assessment is limited by the lack of intravenous contrast, although the hilar contours appear unchanged. The thyroid  gland, trachea and esophagus demonstrate no significant findings.   Lungs/Pleura: No interval change from the most recent study in the moderate  partially loculated right and small dependent left pleural effusions. Little change in appearance of the lungs with central airway thickening, mosaic attenuation and compressive atelectasis in both lower lobes. There is chronic atelectasis or scarring in the right middle lobe and lingula. There is a new small focus of airspace disease or atelectasis medially in the left upper lobe (image 48/4). No suspicious pulmonary nodules.   Upper abdomen: The visualized upper abdomen appears stable, without significant findings. Unchanged thickening of the left adrenal gland and probable small cyst in the upper pole of the left kidney for which no follow-up imaging is recommended.   Musculoskeletal/Chest wall: There is no chest wall mass or suspicious osseous finding. Multilevel spondylosis associated with a convex right thoracolumbar scoliosis.   IMPRESSION: 1. No significant change in the moderate partially loculated right and small dependent left pleural effusions with associated compressive atelectasis in both lower lobes. Consider thoracentesis if the etiology for these effusions is unknown. 2. New small focus of airspace disease or atelectasis medially in the left upper lobe. 3. No suspicious pulmonary nodules. 4. Stable cardiomegaly and central enlargement of the pulmonary arteries consistent with underlying pulmonary arterial hypertension. 5. Stable small mediastinal lymph nodes, likely reactive. 6.  Aortic Atherosclerosis (ICD10-I70.0).     Electronically Signed   By: Elsie Perone M.D.   On: 02/25/2023 14:32       OV 07/14/2023  Subjective:  Patient ID: Janet Nguyen, female , DOB: [birth year] , age 51 y.o. , MRN: [id number] , ADDRESS: [address] [place] [address] PCP Okey Carlin Redbird, MD Patient Care Team: Okey Carlin Redbird, MD as PCP - General (Family Medicine) Burnard Debby LABOR, MD as PCP - Cardiology (Cardiology) Duke, Jon Garre, PA as  Physician Assistant (Cardiology)  This Provider for this visit: Treatment Team:  Attending Provider: Geronimo Amel, MD    07/14/2023 -   Chief Complaint  Patient presents with   Follow-up    C/o tired a lot, sob with exertion,no cough, rash on trunk area itchy     HPI Janet Nguyen 89 y.o. -returns for follow-up.  She had a scheduled follow-up mid November 2024 but she got the month wrong and she showed up in the clinic today and we accommodated her.  It appears to me that she is having a harder time feeling questions that require cognitive processing.  In addition her memory is not serving her well.  She has multitude of complaints.  Mostly fatigue.  These are documented below.  Review of this indicates they are stable.  Individually speaking    OV 09/09/2023  Subjective:  Patient ID: Janet Nguyen, female , DOB: 05/10/1932 , age 14 y.o. , MRN: [id number] , ADDRESS: [address] [place] [address] PCP Okey Carlin Redbird, MD Patient Care Team: Okey Carlin Redbird, MD as PCP - General (Family Medicine) Burnard Debby LABOR, MD as PCP - Cardiology (Cardiology) Duke, Jon Garre, PA as Physician Assistant (Cardiology)  This Provider for this visit: Treatment Team:  Attending Provider: Geronimo Amel, MD    09/09/2023 -   Chief Complaint  Patient presents with   Follow-up    Increased sob, states started in October. Using albuterol inhaler      HPI Janet Nguyen 89 y.o. -returns for follow-up.  This was made on rather.  Early basis because she says she is getting worse.  She says since her last visit she is having progressive worsening of shortness of breath.  She says she feels like a resource.  She is having less sleep she is waking up in the middle of the night from sleep but is not for shortness of breath she is just waking up.  She is also feeling hot and cold.  She has fatigue.  She says she has lost some weight and it is to the point that she  did not want the CMA to check her blood pressure because she was worried that the blood pressure cuff will cause a humerus fracture.  She said yesterday was a bad day and she felt like her heart was racing but today that she is okay.  Her pulse was actually regular here.  She is going to see cardiology in a few weeks.  Socially she is living alone.  She is finding it difficult to manage her home.  She is looking for domestic help.  I told her to talk to her children.  Also her rash comes and goes.  So far she has not addressed this.  She continues on low-dose Eliquis  for atrial fibrillation.  Review of the labs indicate in the past she has had BNP.  All her labs are over 16 months old.  She is agreed to have some labs today for dyspnea.  Pulse ox at rest was 96% with a heart rate of 66 but when she did exert herself her heart rate did go up to 100/min but her pulse ox did not change.  Clinically her edema is under control there is no wheezing.  Of note she asked again about Spiriva .  Will prescribe this urgently over a year ago.  She was instructed about this multiple times but every time she says she denies that we ever prescribed this however educated about Spiriva  use.  I did remind her about this.  She acknowledged that her memory might be failing her.  She is willing to try Spiriva  again.  Gave her samples.         OV 03/29/2024  Subjective:  Patient ID: Janet Nguyen, female , DOB: 01-01-1932 , age 64 y.o. , MRN: [id number] , ADDRESS: [address] [place] [address] PCP Okey Carlin Redbird, MD Patient Care Team: Okey Carlin Redbird, MD as PCP - General (Family Medicine) Lonni Slain, MD as PCP - Cardiology (Cardiology) Duke, Jon Garre, PA as Physician Assistant (Cardiology)  This Provider for this visit: Treatment Team:  Attending Provider: Geronimo Amel, MD    03/29/2024 -   Chief Complaint  Patient presents with   Follow-up    DOE no better since the  last visit. She wants to discuss d/c Eliquis  since it's causing rash and itching possibly.      #Multifactorial dyspnea  -Scoliosis, bronchiectasis seen on CT scan, mitral valve regurgitation grade 2 diastolic dysfunction, atrial flutter and pulmonary embolism January 2023   -Visit October 2024: Dyspnea stable  #New onset right greater than left small to moderate pleural effusions July 2023 [absent in January 2023)  -Saw Dr. Debby Sor September 2024: BNP was high.  Diuresis adjusted and she is better edema is also better.   #  pulm embolism diagnosed January 2023 through VQ scan and high D-dimer [normal Dopplers] #Atrial flutter managed by Dr. Burnard  -On outpatient Eliquis  -since January 2023: Switch to low-dose Eliquis  September 2023 for risk mitigation  #Bronchiectasis diagnosis given genera 2023   - Spiriva  samples given earlier in the year 2023 but did not use due to cost and lack of efficacy  -Flutter valve recommended but only with sporadic resultant use.  -October 2020 for: Not using flutter valve.  Using a lot of albuterol.  Advised her to cut back on the albuterol.  #Radon exposure #Right lower lobe lung nodule new 7.5 mm January 2023    -On surveillance/expectant follow-up   - no nodules reported July 2023 AND MAy 2024  #Positive rheumatoid factor new diagnosis January 2023  -On surveillance  -Looked at her hands they are not warm.  The joints are not warm.  There is no early morning stiffness.  Indicated fatigue might be related to rheumatoid arthritis.  Does not want to see rheumatology because she not interested in Plaquenil  #Remote history of epistaxis resulting in emergency room visit posing a concern for patient on Eliquis   #Chronic pedal edema with decompensation summer 2023 right greater than left -Long history of varicose veins and cor pulmonale considered etiologies -Improved October 2024  #New onset rash: Since September 2020 for having urticarial rash.   Therefore would defer the flu shot.  Told her to talk to primary care.  It is on her right upper quadrant and also in her thorax particularly in her back.   HPI Janet Nguyen 89 y.o. -she is an acute visit eper scdhuler.  However patient states this is not an acute visit.  After my last visit with that she switched service to Dr. Slater Staff.  This is because she categorically stated that I do not spend enough time with her.  She is also stated that I do not explain problem satisfactorily enough.  She states that I am always in a rush.  However she states she did not categorically changed to Dr. Staff.  She also states today's visit is not an acute visit but it is a chronic visit because she has multiple complaints.  I have informed her she is in a 15-minute visit slot as an acute visit.  She says she is not getting better.  She says she does not understand her pulmonary issues.  She says she does not know what the lung diseases.  She says she does not know why she is on anticoagulation with Eliquis .  She says Eliquis  is giving her itching giving her easy bruising and also making her eyes go red.  She says that overall she is feeling fatigued.  She says the scoliosis is making it difficult to wear her bra.  She wants a new primary care doctor.  She also fell in April 2025 and sustained a knee abrasion and she says this needs attention.  I did communicate with her that I would address her pulmonary issues and she feels frustrated that the world is changed with too much specialization.  Also said that I would refer her to primary care practice [facility] in the [place] area.  She is frustrated by that.  She says back in the day that doctors used to refer to individuals and now it is all about business.  She is always complained about Eliquis .  Therefore I recommended that she stop her Eliquis  after communicating with Dr. Burnard by Dr. Burnard is  retired.  Therefore I recommended she stop it understanding  the risk versus benefit.  She initially said yes.  She also agrees to switch to Xarelto but later she changed her mind.  And she said she would talk to cardiology.     [place] Symptom Assessment Numerical Scale 0 is no problem -> 10 worst problem 07/21/2022   03/02/2023  07/14/2023   No Pain -> Worst pain 0 5 5  No Tiredness -> Worset tiredness 4 5 6   No Nausea -> Worst nausea 2 0 0  No Depression -> Worst depression 8 5 6   No Anxiety -> Worst Anxiety 8 5 4   No Drowsiness -> Worst Drowsiness 8 3 2   Best appetite-> Worst Appetitle 4 1 0  Best Feeling of well being -> Worst feeling 8 1 1   No dyspnea-> Worst dyspnea 2 8 3   Other problem (none -> severe) RLE > LLE Edema  x  Othre issues Epistaxis mild x 1 6   Completed by  patoient               Latest Ref Rng & Units 10/22/2021   11:26 AM  PFT Results  FVC-Pre L 1.79   FVC-Predicted Pre % 89   FVC-Post L 1.78   FVC-Predicted Post % 89   Pre FEV1/FVC % % 73   Post FEV1/FCV % % 76   FEV1-Pre L 1.30   FEV1-Predicted Pre % 89   FEV1-Post L 1.36   DLCO uncorrected ml/min/mmHg 10.42   DLCO UNC% % 61   DLCO corrected ml/min/mmHg 10.42   DLCO COR %Predicted % 61   DLVA Predicted % 84   TLC L 4.21   TLC % Predicted % 89   RV % Predicted % 101        LAB RESULTS last 96 hours No results found.       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   reports that she has never smoked. She has never used smokeless tobacco.  Past Surgical History:  Procedure Laterality Date   ANAL RECTAL MANOMETRY N/A 09/16/2023   Procedure: ANO RECTAL MANOMETRY;  Surgeon: Burnette Fallow, MD;  Location: [facility] ENDOSCOPY;  Service:  Gastroenterology;  Laterality: N/A;   APPENDECTOMY     ruptured at age 38 and had surgery   CARDIAC CATHETERIZATION  01/27/06   CARDIOVERSION N/A 01/08/2022   Procedure: CARDIOVERSION;  Surgeon: Hobart Powell BRAVO, MD;  Location[facility] END[facility];  Service: Cardiovascular;  Laterality: N/A;   cataract surgery  2015   Dr. Cleatus; March & April 2015    Allergies  Allergen Reactions   Gluten Meal Other (See Comments)    Unknown   Naproxen Other (See Comments)    Stomach upset   Amoxicillin  Rash    Immunization History  Administered Date(s) Administered   Fluad Quad(high Dose 65+) 07/11/2022   Fluzone  Influenza virus vaccine,trivalent (IIV3), split virus 06/26/2014, 06/29/2018   Influenza, High Dose Seasonal PF 06/15/2015, 06/19/2016, 07/24/2017, 05/24/2019, 06/20/2021   Influenza,inj,quad, With Preservative 06/29/2018   Influenza-Unspecified 06/30/2013   PFIZER Comirnaty(Gray Top)Covid-19 Tri-Sucrose Vaccine 02/21/2021   PFIZER(Purple Top)SARS-COV-2 Vaccination 10/14/2019, 11/04/2019, 07/18/2020, 07/01/2021   Pneumococcal Conjugate-13 03/09/2014   Tdap 03/21/2009, 05/29/2021    Family History  Problem Relation Age of Onset  Breast cancer Mother    Heart disease Father    Migraines Neg Hx      Current Outpatient Medications:    acetaminophen  (TYLENOL ) 500 MG tablet, Take 500 mg by mouth every 8 (eight) hours as needed for moderate pain., Disp: , Rfl:    albuterol (VENTOLIN HFA) 108 (90 Base) MCG/ACT inhaler, Inhale 2 puffs into the lungs every 4 (four) hours as needed., Disp: , Rfl:    Aloe-Sodium Chloride  (AYR SALINE NASAL GEL NA), Place 1 application  into the nose daily as needed (rhinitis)., Disp: , Rfl:    ALPRAZolam  (XANAX ) 0.5 MG tablet, Take 0.25-0.5 mg by mouth 2 (two) times daily as needed for anxiety., Disp: , Rfl:    apixaban  (ELIQUIS ) 2.5 MG TABS tablet, Take 1 tablet (2.5 mg total) by mouth 2 (two) times daily., Disp: 28 tablet, Rfl:    Biotin w/ Vitamins C & E  (HAIR SKIN & NAILS GUMMIES PO), Take 1 tablet by mouth daily., Disp: , Rfl:    Calcium Carb-Ergocalciferol (CHEWABLE CALCIUM/D PO), Take 1 each by mouth daily., Disp: , Rfl:    clidinium-chlordiazePOXIDE  (LIBRAX) 5-2.5 MG capsule, Take 1 capsule by mouth daily as needed., Disp: 60 capsule, Rfl: 3   digoxin  (LANOXIN ) 0.125 MG tablet, TAKE 1/2 TABLET BY MOUTH DAILY, Disp: 45 tablet, Rfl: 2   diltiazem  (CARDIZEM  CD) 120 MG 24 hr capsule, TAKE 1 CAPSULE BY MOUTH DAILY, Disp: 90 capsule, Rfl: 3   Doxylamine-Phenylephrine -APAP (SINUS & CONGESTION DAY/NIGHT) MISC, 1 tablet Orally once a day As needed, Disp: , Rfl:    fluticasone (FLONASE) 50 MCG/ACT nasal spray, Place 1 spray into both nostrils daily as needed for allergies or rhinitis., Disp: , Rfl:    latanoprost (XALATAN) 0.005 % ophthalmic solution, As directed, Disp: , Rfl:    metoprolol  succinate (TOPROL -XL) 50 MG 24 hr tablet, Take 2 tablets (100 mg) in the morning.  Take 2 (100mg ) tablets in the evening., Disp: 315 tablet, Rfl: 3   Phenylephrine -APAP-guaiFENesin (EQ SINUS CONGESTION & PAIN PO), Take 1 tablet by mouth daily as needed (for sinus pain). Contains acetaminophen  325 mg and Phenylephrine  5 mg, Disp: , Rfl:    torsemide  (DEMADEX ) 20 MG tablet, Take 2 tablets (40 mg total) by mouth daily. Take 1 Tablet in the Morning and 1 Tablet in the evening, Disp: 180 tablet, Rfl: 3   zolpidem  (AMBIEN ) 5 MG tablet, Take 2.5 mg by mouth at bedtime., Disp: , Rfl:    apixaban  (ELIQUIS ) 2.5 MG TABS tablet, Take 1 tablet (2.5 mg total) by mouth 2 (two) times daily., Disp: 28 tablet, Rfl: 0   budeson-glycopyrrolate-formoterol (BREZTRI  AEROSPHERE) 160-9-4.8 MCG/ACT AERO, Inhale 2 puffs into the lungs in the morning and at bedtime. (Patient not taking: Reported on 02/08/2024), Disp: , Rfl:    mirtazapine (REMERON) 15 MG tablet, Take 15 mg by mouth at bedtime. (Patient not taking: Reported on 02/08/2024), Disp: , Rfl:    nitrofurantoin, macrocrystal-monohydrate,  (MACROBID) 100 MG capsule, Take 100 mg by mouth 2 (two) times daily. (Patient not taking: Reported on 02/08/2024), Disp: , Rfl:    polyethylene glycol powder (GLYCOLAX /MIRALAX ) 17 GM/SCOOP powder, Take 17 g by mouth daily as needed for moderate constipation. (Patient not taking: Reported on 02/08/2024), Disp: , Rfl:    potassium chloride  (KLOR-CON  M) 10 MEQ tablet, Take 1 tablet (10 mEq total) by mouth daily., Disp: 90 tablet, Rfl: 3   Tiotropium Bromide  Monohydrate (SPIRIVA  RESPIMAT) 1.25 MCG/ACT AERS, Inhale 2 puffs into the lungs daily., Disp:  4 g, Rfl: 5      Objective:   Vitals:   03/29/24 1401  BP: 122/76  Pulse: 80  SpO2: 97%  Weight: 114 lb 12.8 oz (52.1 kg)  Height: 5' (1.524 m)    Estimated body mass index is 22.42 kg/m as calculated from the following:   Height as of this encounter: 5' (1.524 m).   Weight as of this encounter: 114 lb 12.8 oz (52.1 kg).  @WEIGHTCHANGE @  [employer]   03/29/24 1401  Weight: 114 lb 12.8 oz (52.1 kg)     Physical Exam   General: No distress. Looks same O2 at rest: no Cane present: no Sitting in wheel chair: no Frail: no Obese: no Neuro: Alert and Oriented x 3. GCS 15. Speech normal Psych: Pleasant Resp:  Barrel Chest - no.  Wheeze - no, Crackles - no, No overt respiratory distress CVS: Normal heart sounds. Murmurs - no Ext: Stigmata of Connective Tissue Disease - no HEENT: Normal upper airway. PEERL +. No post nasal drip SCOLIOSIS +        Assessment:       ICD-10-CM   1. Does not have primary care provider  Z75.8 Ambulatory referral to Internal Medicine    2. Other idiopathic scoliosis, thoracolumbar region  M41.25     3. DOE (dyspnea on exertion)  R06.09          Plan:     Patient Instructions  DOE (dyspnea on exertion)   - PE in Jan 2023, scoliosis, bronchiectasis, mitral valve leak and and grade 2 heart muscle stiffness, atrial flutter contributing to respiratory symptoms - Currently you are rporting  progressive shortness of breath but clinically you are stable   Plan - supportive care - Address individual components below - Follow with Dr Slater Staff  #Pulmonary embolism diagnosed January 2023 #ATrial F;lutter - anticocagulton  - completed 1 year of total eliquis   with low dose Sept 2023-Jan 2024 but on 09/09/2023\ - on low dose eliquis  for  A Fib - having side effects with Elquis (itching, burisig, dry eyes)  Plan  -- From PE perspective you do NOT need eliquis  but you probably need it for A Fibritllation - try low dose Xarelto  instead of eliquis    - talk about this with cardiology before deciding    #Bronchiectasis  -Spiriva  sample months ago did not help and was too expensive - currently doing well  Plan  - Flutter valve 5 times daily   - retrain again 07/14/2023 by CMA - Spirivan respimat samples for 4 weeks - do albuterol only as needed - limit to 1-2 times per day  Right lower lobe lung nodule 7.83mm on CT 10/09/21 - new. Resolved nodule July 2023 Radon Exposure x 34 years - newly discovered   Plan  - CT only if clinical need   #history of sleep apnea with prior intolerance to cPAP in 2015-2017  Plan  = expectant followup      Plan  - management by PCP Okey Carlin Redbird, MD and cardiology   Positive rheumatoid factor January 2023  - no early morning joint stiffnes or current warmth - not interested in plaquenil or medications if rheumatoid confirmed  PLAN shared decisio  in July 2023 and again 07/14/2023 for expectant followup  Dry eyes Itching Need for PCP   Plan  -refer [facility]  Followup  - establish with Dr Slater Pollen - first avaialble pumonary  - New PCP referral -  FOLLOWUP Return for followup with new pulmonary Dr Kassie.     SIGNATURE    Dr. Dorethia Cave, M.D., F.C.C.P,  Pulmonary and Critical Care Medicine Staff Physician, [facility] Center Director - Interstitial Lung  Disease  Program  Pulmonary Fibrosis [facility] at [facility] [place], [place], [address]  Pager: [phone], If no answer or between  15:00h - 7:00h: call [phone] Telephone: [phone]  2:34 PM 03/29/2024

## 2024-03-29 NOTE — Patient Instructions (Addendum)
 DOE (dyspnea on exertion)   - PE in Jan 2023, scoliosis, bronchiectasis, mitral valve leak and and grade 2 heart muscle stiffness, atrial flutter contributing to respiratory symptoms - Currently you are rporting progressive shortness of breath but clinically you are stable   Plan - supportive care - Address individual components below - Follow with Dr Slater Staff  #Pulmonary embolism diagnosed January 2023 #ATrial F;lutter - anticocagulton  - completed 1 year of total eliquis   with low dose Sept 2023-Jan 2024 but on 09/09/2023\ - on low dose eliquis  for  A Fib - having side effects with Elquis (itching, burisig, dry eyes)  Plan  -- From PE perspective you do NOT need eliquis  but you probably need it for A Fibritllation - try low dose Xarelto  instead of eliquis    - talk about this with cardiology before deciding    #Bronchiectasis  -Spiriva  sample months ago did not help and was too expensive - currently doing well  Plan  - Flutter valve 5 times daily   - retrain again 07/14/2023 by CMA - Spirivan respimat samples for 4 weeks - do albuterol only as needed - limit to 1-2 times per day  Right lower lobe lung nodule 7.19mm on CT 10/09/21 - new. Resolved nodule July 2023 Radon Exposure x 34 years - newly discovered   Plan  - CT only if clinical need   #history of sleep apnea with prior intolerance to cPAP in 2015-2017  Plan  = expectant followup      Plan  - management by PCP Okey Carlin Redbird, MD and cardiology   Positive rheumatoid factor January 2023  - no early morning joint stiffnes or current warmth - not interested in plaquenil or medications if rheumatoid confirmed  PLAN shared decisio  in July 2023 and again 07/14/2023 for expectant followup  Dry eyes Itching Need for PCP   Plan  -refer  [facility]  Followup  - establish with Dr Slater Pollen - first avaialble pumonary  - New PCP referral -

## 2024-04-02 ENCOUNTER — Other Ambulatory Visit: Payer: Self-pay | Admitting: Student

## 2024-04-04 ENCOUNTER — Ambulatory Visit: Payer: Medicare Other | Admitting: Physical Therapy

## 2024-04-04 ENCOUNTER — Other Ambulatory Visit: Payer: Self-pay

## 2024-04-04 MED ORDER — TORSEMIDE 20 MG PO TABS: ORAL_TABLET | ORAL | 3 refills | Status: AC

## 2024-04-04 NOTE — Therapy (Incomplete)
 OUTPATIENT PHYSICAL THERAPY THORACOLUMBAR TREATMENT NOTE        Patient Name: Janet Nguyen MRN: [id number] DOB:1932-03-03, 89 y.o., female Today's Date: 04/04/2024  END OF SESSION:                 Past Medical History:  Diagnosis Date   Arrhythmia    History of SVT with documented PVC'S and  PAC'S  12/08/12 Nuc stress test normal LV EF 74%  Event Monitor  12/01/12-01/03/13   Atrial flutter (HCC)    Celiac disease    treated by Dr. Luis   GERD (gastroesophageal reflux disease)    Hypertension    Intervertebral disc stenosis of neural canal of cervical region    Irregular heart beat 11/30/2012   ECHO-EF 60-65%   Osteoporosis    PMR (polymyalgia rheumatica) (HCC)    Dr. Balinda; pt states she was diagnosed 10-15 years ago, not treated at this time or any issues that she is aware of.   Scoliosis    Scoliosis    Sleep apnea 10/02/11 [facility]   Sleep study AHI -total sleep 10.3/hr  64.0/ hr during REM sleep.RDI 22.8/hr during total sleep 64.0/hr during REM sleep The lowest O2 sat during Non-REM and REM sleep was 86% and 88% respectively. 04/08/12 [facility]AP[facility]BIPAP titration [facility]   Past Surgical History:  Procedure Laterality Date   ANAL RECTAL MANOMETRY N/A 09/16/2023   Procedure: ANO RECTAL MANOMETRY;  Surgeon: Burnette Fallow, MD;  Location: [facility];  Service: Gastroenterology;  Laterality: N/A;   APPENDECTOMY     ruptured at age 80 and had surgery   CARDIAC CATHETERIZATION  01/27/06   CARDIOVERSION N/A 01/08/2022   Procedure: CARDIOVERSION;  Surgeon: Hobart Powell BRAVO, MD;  Location: [facility];  Service: Cardiovascular;  Laterality: N/A;   cataract surgery  2015   Dr. Cleatus; March & April 2015   Patient Active Problem List   Diagnosis Date Noted   Dyspnea 05/30/2023   Pleural effusion 05/30/2023   Radon exposure 05/30/2023   Secondary hypercoagulable state (HCC) 12/12/2021   Hyponatremia  12/04/2021   Acute CHF (congestive heart failure) (HCC) 12/02/2021   Chronic diastolic heart failure (HCC) 12/02/2021   Atrial fibrillation with rapid ventricular response (HCC) 12/02/2021   Essential hypertension 12/02/2021   GERD without esophagitis 12/02/2021   Obstructive sleep apnea 12/02/2021   Interstitial lung disease (HCC) 12/02/2021   History of pulmonary embolism 12/02/2021   Pulmonary embolism (HCC) 11/06/2021   Bronchiectasis (HCC) 11/06/2021   Deviated septum 05/08/2020   Mass of subcutaneous tissue 05/02/2020   Vertigo 06/16/2019   Pulmonary hypertension, unspecified (HCC) 04/14/2019   Ischemic colitis (HCC) 04/14/2019   Migraine with aura and without status migrainosus, not intractable 11/08/2018   Degeneration of lumbar intervertebral disc 10/14/2018   Hoarseness of voice 03/04/2018   Abdominal pain 07/01/2017   Diverticulitis, colon    Metabolic acidosis, increased anion gap    Constipation 04/14/2017   Lumbar hernia 04/14/2017   Presbycusis of both ears 01/10/2017   Tinnitus aurium, bilateral 01/10/2017   Gastroesophageal reflux disease 08/20/2016   Hemoptysis 08/20/2016   Obstructive sleep apnea of adult 08/20/2016   Rhinitis, chronic 08/20/2016   Throat pain in adult 08/20/2016   Fatigue 12/23/2015   Sciatica of right side 10/06/2015   History of migraine headaches 10/06/2015   Frequent PVCs 12/28/2013   Premature atrial contractions 12/28/2013   PSVT (paroxysmal supraventricular tachycardia) (HCC) 12/28/2013   Heart palpitations 07/13/2013  Sleep apnea 04/11/2013   Scoliosis 04/11/2013   Atypical atrial flutter (HCC) 11/29/2012   Chest pain, atypical 11/29/2012   Fibromyalgia syndrome 11/29/2012   Chronic steroid use 11/29/2012    PCP: Okey Carlin Redbird, MD  REFERRING PROVIDER: Okey Carlin Redbird, MD  REFERRING DIAG: M41.9 (ICD-10-CM) - Scoliosis  Rationale for Evaluation and Treatment: Rehabilitation  THERAPY DIAG:  No diagnosis  found.  ONSET DATE: chronic, years of progressive pain from degenerative scoliosis  SUBJECTIVE:                                                                                                                                                                                           SUBJECTIVE STATEMENT: I am achy all over and my neck has been bothering me more than usual since hosting my 89th bday party.  PERTINENT HISTORY:  Degenerative scoliosis, complex medical history including pulmonary HTN, frequent PVCs and a-flutter, history of cardioversion, osteoporosis Has been SOB with activity On waitlist to get into cardiologist sooner   PAIN:  PAIN:  Are you having pain? Yes NPRS scale: 7/10 Pain location: abdomen, left side of trunk, low back, intermittent bil hips/buttock/knee, Rt foot pains, neck Pain orientation: Other: see above  PAIN TYPE: aching, sharp, and tight Pain description: intermittent and constant  Aggravating factors: varies Relieving factors: unsure   PRECAUTIONS: Other: osteoporosis  RED FLAGS: None   WEIGHT BEARING RESTRICTIONS: No  FALLS:  Has patient fallen in last 6 months? No  LIVING ENVIRONMENT: Lives with: lives with their family and lives alone Lives in: House/apartment Stairs: No Has following equipment at home: has cane and walker but does not use  OCCUPATION: retired  PLOF: Independent with basic ADLs, Independent with household mobility without device, Independent with community mobility without device, and Needs assistance with homemaking  PATIENT GOALS:  be able to eat, improve strength for bowel control, feel less pain and more energy to expand social outlets and walking  NEXT MD VISIT: as needed  OBJECTIVE:  Note: Objective measures were completed at Evaluation unless otherwise noted.  DIAGNOSTIC FINDINGS:    PATIENT SURVEYS:  PSEQ: 33/60 (<30 = low self-efficacy) Pain Catastrophizing Scale: 17 (>30 = clinically signif level of  pain catastrophizing)  COGNITION: Overall cognitive status: intact with improved outlook     SENSATION: WFL  MUSCLE LENGTH: 5/30: see palpation for updates 4/4 See palpation for updates Eval: Adaptive shortening of Lt obliques, QL, lat secondary to rigid scoliosis  POSTURE: rounded shoulders, forward head, decreased lumbar lordosis, and increased thoracic kyphosis, severe degenerative scoliosis with Lt SB/Rt Rot, left ribcage in near contact with left ilium  PALPATION:  5/30: Improved soft tissue extensibility along Lt side of trunk, improving lateral costal expansion along Lt side of trunk, improving gut sounds and motility along Lt side of trunk, reducing trigger points in Lt lat and shoulder  4/4:  Improving mobility of soft tissues along Lt side of trunk allowing reduced pressure on bowels, bladder, stomach to allow improved bowel movements, control of bowel and bladder Improving lateral costal expansion along Lt side of trunk for improved depth of inhale and stretching of intercostals  Eval:  Spasm present in bil upper traps Fascial and muscle mobility restriction along Lt side of trunk due to adaptive shortening from scoliosis: obliques, QL, hip flexors, lat  LUMBAR ROM:   AROM eval 01/01/24 02/26/24  Flexion full full full  Extension NT NT NT  Right lateral flexion 20% 30% 40%  Left lateral flexion 40% 50% 75%  Right rotation 40% 50% 50%  Left rotation 20% 20% 30%   (Blank rows = not tested)  LOWER EXTREMITY ROM:    WFL, flexion is full with good hamstring length   LOWER EXTREMITY MMT:   4/4: hip abd and ER 4/5 Eval: Grossly 4/5 throughout, 4-/5 hip abduction, ER  FUNCTIONAL TESTS:    GAIT: Distance walked: within clinic Assistive device utilized: None Level of assistance: Modified independence Comments: short stride length  TREATMENT DATE:  04/04/24:   03/11/24 Rt SL manual therapy - broadening and elongation of Lt obliques, QL, intercostals, paraspinals  from cervical to lumbar, gluteals, hip flexors.  Gentle sacral distraction. Supine core: bicycle, scissors, march alt LE x10 each Seated in chair posterior bil upper quadrant stripping, passive pec stretch bil Seated UE reach up and over to stretch Lt side of trunk 3x20 Seated UE horiz abd, row, shoulder ext x10 each Seated ball squeeze with PF lift 10x5  03/04/24 Rt SL manual therapy - broadening and elongation of Lt obliques, QL, intercostals, paraspinals from cervical to lumbar, gluteals, hip flexors.  Gentle sacral distraction. Supine core: bicycle, scissors, march alt LE x10 each Seated in chair posterior bil upper quadrant stripping, passive pec stretch bil Seated UE reach up and over to stretch Lt side of trunk 3x20 Seated UE horiz abd, row, shoulder ext x10 each Seated ball squeeze with PF lift 10x5                                                                              PATIENT EDUCATION:  Education details: Pt education (see above) Person educated: Patient Education method: Explanation Education comprehension: verbalized understanding  HOME EXERCISE PROGRAM: Journaling to outline some activities to try Pelvic floor - select exercises from past HEP Supine LE stretches from past HEP  Access Code: [email or web address] URL: [email or web address] Date: 12/25/2023 Prepared by: Orvil Maudie Shingledecker  Exercises - Sidelying Diaphragmatic Breathing  - 1 x daily - 7 x weekly - 1 sets - 10 reps - Seated Hip Abduction with Resistance  - 1 x daily - 7 x weekly - 2 sets - 10 reps - Standing Row with Anchored Resistance  - 1 x daily - 7 x weekly - 2 sets - 10 reps - Standing Shoulder Extension with Resistance  - 1 x daily - 7 x weekly -  2 sets - 10 reps  ASSESSMENT:  CLINICAL IMPRESSION: Pt more achy today having hosted a 89th bday party at her house.  She has needed more heat and ice (alternating) for her neck since last weekend.  More manual time spent today on her neck to  address soft tissue tension.  Pt reports she is tolerating all household, yard and errands with less pain having had more consistent PT.  She feels her outlook is improved as she is feeling more pain control with consistent PT.  She demos improving mobility, exercise tolerance and soft tissue extensibility along Lt side of trunk.  She continues to have limited endurance secondary to severe degenerative scoliosis compressing Lt lung and diaphragm. She will continue to benefit from PT to manage pain and optimize systems affected severe compression from degenerative scoliosis (GI, cardiovascular, musculoskeletal).    OBJECTIVE IMPAIRMENTS: cardiopulmonary status limiting activity, decreased activity tolerance, decreased coordination, decreased endurance, decreased mobility, decreased ROM, decreased strength, hypomobility, increased fascial restrictions, increased muscle spasms, impaired flexibility, impaired tone, improper body mechanics, postural dysfunction, and pain.   ACTIVITY LIMITATIONS: carrying, lifting, bending, sitting, standing, squatting, sleeping, stairs, transfers, bed mobility, continence, bathing, dressing, and locomotion level  PARTICIPATION LIMITATIONS: meal prep, cleaning, laundry, shopping, and community activity  PERSONAL FACTORS: Age, Behavior pattern, Time since onset of injury/illness/exacerbation, and 1-2 comorbidities: pulmonary HTN, a-flutter, osteoporosis, severe degenerative scoliosis are also affecting patient's functional outcome.   REHAB POTENTIAL: Good  CLINICAL DECISION MAKING: Evolving/moderate complexity  EVALUATION COMPLEXITY: Moderate   GOALS: Goals reviewed with patient? Yes  SHORT TERM GOALS: Target date: 11/27/23  Pt will journal and discover at least 1 activity to return to that she enjoys Baseline: Goal status: met, watching movies with neighbors 3/7  2.  Pt will strive to eat consistent small meals throughout the day to initiate a bowel  routine Baseline:  Goal status: met 4/4, most days  3.  Pt will report 20% less pain around abdomen and trunk to allow for comfort with small meals Baseline:  Goal status: met for most days 4/4    LONG TERM GOALS: Target date: 01/01/24  Pt will return to walking program at least 2x/week for multi-system health benefits. Baseline:  Goal status: will walk one lap around neighborhood 1-2x/day and participate in short errands  2.  Pt will be ind with HEP for gluteal and pelvic floor strength to reduce incidence of bowel leakage. Baseline:  Goal status: ongoing 5/30, notes about 25% less leakage  3.  Pt will improve score of PSEQ to at least 40 to demo improved self-efficacy within chronic pain. Baseline: 33 Goal status: ongoing  4.  Pt will establish nighttime routine including [employer] app, medidation, breathing and stretching routine to improve sleep stretches. Baseline:  Goal status: MET 5/30, has established consistent routine  5.  Pt will report at least 50% less pain with daily tasks and eating secondary to reduced soft tissue compression surrounding scoliosis. Baseline:  Goal status: ONGOING 5/30, reports 25% improvement most days of the week    PLAN:  PT FREQUENCY: 1-2x/week  PT DURATION: 8 weeks  PLANNED INTERVENTIONS: 97110-Therapeutic exercises, 97530- Therapeutic activity, 97112- Neuromuscular re-education, 97535- Self Care, [id number]- Manual therapy, Dry Needling, Joint mobilization, Spinal mobilization, Cryotherapy, and Moist heat.  PLAN FOR NEXT SESSION: postural strength, incorporate PF education and training with therex, ambulation for endurance, manual therapy  Mikle Sternberg, PT 04/04/24 8:04 AM

## 2024-04-05 ENCOUNTER — Ambulatory Visit: Attending: Family Medicine | Admitting: Physical Therapy

## 2024-04-05 ENCOUNTER — Encounter: Payer: Self-pay | Admitting: Physical Therapy

## 2024-04-05 ENCOUNTER — Ambulatory Visit: Admitting: Physical Therapy

## 2024-04-05 DIAGNOSIS — M542 Cervicalgia: Secondary | ICD-10-CM | POA: Diagnosis not present

## 2024-04-05 DIAGNOSIS — M545 Low back pain, unspecified: Secondary | ICD-10-CM | POA: Diagnosis not present

## 2024-04-05 DIAGNOSIS — M546 Pain in thoracic spine: Secondary | ICD-10-CM | POA: Insufficient documentation

## 2024-04-05 DIAGNOSIS — M62830 Muscle spasm of back: Secondary | ICD-10-CM | POA: Insufficient documentation

## 2024-04-05 DIAGNOSIS — M4125 Other idiopathic scoliosis, thoracolumbar region: Secondary | ICD-10-CM | POA: Diagnosis not present

## 2024-04-05 DIAGNOSIS — R293 Abnormal posture: Secondary | ICD-10-CM | POA: Diagnosis not present

## 2024-04-05 DIAGNOSIS — G8929 Other chronic pain: Secondary | ICD-10-CM | POA: Diagnosis not present

## 2024-04-05 NOTE — Therapy (Signed)
 OUTPATIENT PHYSICAL THERAPY THORACOLUMBAR TREATMENT NOTE        Patient Name: Janet Nguyen MRN: [id number] DOB:06-02-1932, 89 y.o., female Today's Date: 04/05/2024  END OF SESSION:  PT End of Session - 04/05/24 1259     Visit Number 16    Date for PT Re-Evaluation 04/22/24    Authorization Type Medicare part A/B - KX at visit 15    Progress Note Due on Visit 17    PT Start Time 1226    PT Stop Time 1315    PT Time Calculation (min) 49 min    Activity Tolerance Patient tolerated treatment well    Behavior During Therapy [facility] for tasks assessed/performed                        Past Medical History:  Diagnosis Date   Arrhythmia    History of SVT with documented PVC'S and  PAC'S  12/08/12 Nuc stress test normal LV EF 74%  Event Monitor  12/01/12-01/03/13   Atrial flutter (HCC)    Celiac disease    treated by Dr. Luis   GERD (gastroesophageal reflux disease)    Hypertension    Intervertebral disc stenosis of neural canal of cervical region    Irregular heart beat 11/30/2012   ECHO-EF 60-65%   Osteoporosis    PMR (polymyalgia rheumatica) (HCC)    Dr. Balinda; pt states she was diagnosed 10-15 years ago, not treated at this time or any issues that she is aware of.   Scoliosis    Scoliosis    Sleep apnea 10/02/11 [facility]   Sleep study AHI -total sleep 10.3/hr  64.0/ hr during REM sleep.RDI 22.8/hr during total sleep 64.0/hr during REM sleep The lowest O2 sat during Non-REM and REM sleep was 86% and 88% respectively. 04/08/12 CPAP/BIPAP titration study [facility]   Past Surgical History:  Procedure Laterality Date   ANAL RECTAL MANOMETRY N/A 09/16/2023   Procedure: ANO RECTAL MANOMETRY;  Surgeon: Burnette Fallow, MD;  Location: [facility] ENDOSCOPY;  Service: Gastroenterology;  Laterality: N/A;   APPENDECTOMY     ruptured at age 3 and had surgery   CARDIAC CATHETERIZATION  01/27/06   CARDIOVERSION N/A 01/08/2022    Procedure: CARDIOVERSION;  Surgeon: Hobart Powell BRAVO, MD;  Location: [facility] END[facility]COPY;  Service: Cardiovascular;  Laterality: N/A;   cataract surgery  2015   Dr. Cleatus; March & April 2015   Patient Active Problem List   Diagnosis Date Noted   Dyspnea 05/30/2023   Pleural effusion 05/30/2023   Radon exposure 05/30/2023   Secondary hypercoagulable state (HCC) 12/12/2021   Hyponatremia 12/04/2021   Acute CHF (congestive heart failure) (HCC) 12/02/2021   Chronic diastolic heart failure (HCC) 12/02/2021   Atrial fibrillation with rapid ventricular response (HCC) 12/02/2021   Essential hypertension 12/02/2021   GERD without esophagitis 12/02/2021   Obstructive sleep apnea 12/02/2021   Interstitial lung disease (HCC) 12/02/2021   History of pulmonary embolism 12/02/2021   Pulmonary embolism (HCC) 11/06/2021   Bronchiectasis (HCC) 11/06/2021   Deviated septum 05/08/2020   Mass of subcutaneous tissue 05/02/2020   Vertigo 06/16/2019   Pulmonary hypertension, unspecified (HCC) 04/14/2019   Ischemic colitis (HCC) 04/14/2019   Migraine with aura and without status migrainosus, not intractable 11/08/2018   Degeneration of lumbar intervertebral disc 10/14/2018   Hoarseness of voice 03/04/2018   Abdominal pain 07/01/2017   Diverticulitis, colon    Metabolic acidosis, increased anion gap  Constipation 04/14/2017   Lumbar hernia 04/14/2017   Presbycusis of both ears 01/10/2017   Tinnitus aurium, bilateral 01/10/2017   Gastroesophageal reflux disease 08/20/2016   Hemoptysis 08/20/2016   Obstructive sleep apnea of adult 08/20/2016   Rhinitis, chronic 08/20/2016   Throat pain in adult 08/20/2016   Fatigue 12/23/2015   Sciatica of right side 10/06/2015   History of migraine headaches 10/06/2015   Frequent PVCs 12/28/2013   Premature atrial contractions 12/28/2013   PSVT (paroxysmal supraventricular tachycardia) (HCC) 12/28/2013   Heart palpitations 07/13/2013   Sleep apnea 04/11/2013    Scoliosis 04/11/2013   Atypical atrial flutter (HCC) 11/29/2012   Chest pain, atypical 11/29/2012   Fibromyalgia syndrome 11/29/2012   Chronic steroid use 11/29/2012    PCP: Okey Carlin Redbird, MD  REFERRING PROVIDER: Okey Carlin Redbird, MD  REFERRING DIAG: M41.9 (ICD-10-CM) - Scoliosis  Rationale for Evaluation and Treatment: Rehabilitation  THERAPY DIAG:  Abnormal posture  Cervicalgia  Other idiopathic scoliosis, thoracolumbar region  Pain in thoracic spine  Chronic bilateral low back pain without sciatica  ONSET DATE: chronic, years of progressive pain from degenerative scoliosis  SUBJECTIVE:                                                                                                                                                                                           SUBJECTIVE STATEMENT: I am achy all over and my neck has been bothering me more than usual since hosting my 89th bday party.  PERTINENT HISTORY:  Degenerative scoliosis, complex medical history including pulmonary HTN, frequent PVCs and a-flutter, history of cardioversion, osteoporosis Has been SOB with activity On waitlist to get into cardiologist sooner   PAIN:  PAIN:  Are you having pain? Yes NPRS scale: 7/10 Pain location: abdomen, left side of trunk, low back, intermittent bil hips/buttock/knee, Rt foot pains, neck Pain orientation: Other: see above  PAIN TYPE: aching, sharp, and tight Pain description: intermittent and constant  Aggravating factors: varies Relieving factors: unsure   PRECAUTIONS: Other: osteoporosis  RED FLAGS: None   WEIGHT BEARING RESTRICTIONS: No  FALLS:  Has patient fallen in last 6 months? No  LIVING ENVIRONMENT: Lives with: lives with their family and lives alone Lives in: House/apartment Stairs: No Has following equipment at home: has cane and walker but does not use  OCCUPATION: retired  PLOF: Independent with basic ADLs, Independent with  household mobility without device, Independent with community mobility without device, and Needs assistance with homemaking  PATIENT GOALS:  be able to eat, improve strength for bowel control, feel less pain and more energy to expand social outlets and walking  NEXT  MD VISIT: as needed  OBJECTIVE:  Note: Objective measures were completed at Evaluation unless otherwise noted.  DIAGNOSTIC FINDINGS:    PATIENT SURVEYS:  PSEQ: 33/60 (<30 = low self-efficacy) Pain Catastrophizing Scale: 17 (>30 = clinically signif level of pain catastrophizing)  COGNITION: Overall cognitive status: intact with improved outlook     SENSATION: WFL  MUSCLE LENGTH: 5/30: see palpation for updates 4/4 See palpation for updates Eval: Adaptive shortening of Lt obliques, QL, lat secondary to rigid scoliosis  POSTURE: rounded shoulders, forward head, decreased lumbar lordosis, and increased thoracic kyphosis, severe degenerative scoliosis with Lt SB/Rt Rot, left ribcage in near contact with left ilium  PALPATION: 5/30: Improved soft tissue extensibility along Lt side of trunk, improving lateral costal expansion along Lt side of trunk, improving gut sounds and motility along Lt side of trunk, reducing trigger points in Lt lat and shoulder  4/4:  Improving mobility of soft tissues along Lt side of trunk allowing reduced pressure on bowels, bladder, stomach to allow improved bowel movements, control of bowel and bladder Improving lateral costal expansion along Lt side of trunk for improved depth of inhale and stretching of intercostals  Eval:  Spasm present in bil upper traps Fascial and muscle mobility restriction along Lt side of trunk due to adaptive shortening from scoliosis: obliques, QL, hip flexors, lat  LUMBAR ROM:   AROM eval 01/01/24 02/26/24  Flexion full full full  Extension NT NT NT  Right lateral flexion 20% 30% 40%  Left lateral flexion 40% 50% 75%  Right rotation 40% 50% 50%  Left  rotation 20% 20% 30%   (Blank rows = not tested)  LOWER EXTREMITY ROM:    WFL, flexion is full with good hamstring length   LOWER EXTREMITY MMT:   4/4: hip abd and ER 4/5 Eval: Grossly 4/5 throughout, 4-/5 hip abduction, ER  FUNCTIONAL TESTS:    GAIT: Distance walked: within clinic Assistive device utilized: None Level of assistance: Modified independence Comments: short stride length  TREATMENT DATE:  04/05/24: Rt SL manual therapy - broadening and elongation of Lt obliques, QL, intercostals, paraspinals from cervical to lumbar, gluteals, hip flexors.  Gentle sacral distraction. Supine core: bicycle, scissors, march alt LE x10 each Supine HS stretch with ankle pumps bil x30 DKTC x 30 LTR x 10 Seated in chair posterior bil upper quadrant stripping, passive pec stretch bil  03/11/24 Rt SL manual therapy - broadening and elongation of Lt obliques, QL, intercostals, paraspinals from cervical to lumbar, gluteals, hip flexors.  Gentle sacral distraction. Supine core: bicycle, scissors, march alt LE x10 each Seated in chair posterior bil upper quadrant stripping, passive pec stretch bil Seated UE reach up and over to stretch Lt side of trunk 3x20 Seated UE horiz abd, row, shoulder ext x10 each Seated ball squeeze with PF lift 10x5  03/04/24 Rt SL manual therapy - broadening and elongation of Lt obliques, QL, intercostals, paraspinals from cervical to lumbar, gluteals, hip flexors.  Gentle sacral distraction. Supine core: bicycle, scissors, march alt LE x10 each Seated in chair posterior bil upper quadrant stripping, passive pec stretch bil Seated UE reach up and over to stretch Lt side of trunk 3x20 Seated UE horiz abd, row, shoulder ext x10 each Seated ball squeeze with PF lift 10x5  PATIENT EDUCATION:  Education details: Pt education (see above) Person educated: Patient Education method:  Explanation Education comprehension: verbalized understanding  HOME EXERCISE PROGRAM: Journaling to outline some activities to try Pelvic floor - select exercises from past HEP Supine LE stretches from past HEP  Access Code: [email or web address] URL: [email or web address] Date: 12/25/2023 Prepared by: Orvil Labrisha Wuellner  Exercises - Sidelying Diaphragmatic Breathing  - 1 x daily - 7 x weekly - 1 sets - 10 reps - Seated Hip Abduction with Resistance  - 1 x daily - 7 x weekly - 2 sets - 10 reps - Standing Row with Anchored Resistance  - 1 x daily - 7 x weekly - 2 sets - 10 reps - Standing Shoulder Extension with Resistance  - 1 x daily - 7 x weekly - 2 sets - 10 reps  ASSESSMENT:  CLINICAL IMPRESSION: Pt reports her whole spine has been bothering her but overall she is moving well.  She is in good spirits as she feels consistently better with consistent PT.  Pt reports she is tolerating all household, yard and errands with less pain having had more consistent PT  She demos improving mobility, exercise tolerance and soft tissue extensibility along Lt side of trunk.  She continues to have limited endurance secondary to severe degenerative scoliosis compressing Lt lung and diaphragm. She will continue to benefit from PT to manage pain and optimize systems affected severe compression from degenerative scoliosis (GI, cardiovascular, musculoskeletal).    OBJECTIVE IMPAIRMENTS: cardiopulmonary status limiting activity, decreased activity tolerance, decreased coordination, decreased endurance, decreased mobility, decreased ROM, decreased strength, hypomobility, increased fascial restrictions, increased muscle spasms, impaired flexibility, impaired tone, improper body mechanics, postural dysfunction, and pain.   ACTIVITY LIMITATIONS: carrying, lifting, bending, sitting, standing, squatting, sleeping, stairs, transfers, bed mobility, continence, bathing, dressing, and locomotion level  PARTICIPATION  LIMITATIONS: meal prep, cleaning, laundry, shopping, and community activity  PERSONAL FACTORS: Age, Behavior pattern, Time since onset of injury/illness/exacerbation, and 1-2 comorbidities: pulmonary HTN, a-flutter, osteoporosis, severe degenerative scoliosis are also affecting patient's functional outcome.   REHAB POTENTIAL: Good  CLINICAL DECISION MAKING: Evolving/moderate complexity  EVALUATION COMPLEXITY: Moderate   GOALS: Goals reviewed with patient? Yes  SHORT TERM GOALS: Target date: 11/27/23  Pt will journal and discover at least 1 activity to return to that she enjoys Baseline: Goal status: met, watching movies with neighbors 3/7  2.  Pt will strive to eat consistent small meals throughout the day to initiate a bowel routine Baseline:  Goal status: met 4/4, most days  3.  Pt will report 20% less pain around abdomen and trunk to allow for comfort with small meals Baseline:  Goal status: met for most days 4/4    LONG TERM GOALS: Target date: 01/01/24  Pt will return to walking program at least 2x/week for multi-system health benefits. Baseline:  Goal status: will walk one lap around neighborhood 1-2x/day and participate in short errands  2.  Pt will be ind with HEP for gluteal and pelvic floor strength to reduce incidence of bowel leakage. Baseline:  Goal status: ongoing 5/30, notes about 25% less leakage  3.  Pt will improve score of PSEQ to at least 40 to demo improved self-efficacy within chronic pain. Baseline: 33 Goal status: ongoing  4.  Pt will establish nighttime routine including Cur[employer] app, medidation, breathing and stretching routine to improve sleep stretches. Baseline:  Goal status: MET 5/30, has established consistent routine  5.  Pt will report at least 50% less pain with daily tasks  and eating secondary to reduced soft tissue compression surrounding scoliosis. Baseline:  Goal status: ONGOING 5/30, reports 25% improvement most days of the  week    PLAN:  PT FREQUENCY: 1-2x/week  PT DURATION: 8 weeks  PLANNED INTERVENTIONS: 97110-Therapeutic exercises, 97530- Therapeutic activity, 97112- Neuromuscular re-education, 97535- Self Care, [id number]- Manual therapy, Dry Needling, Joint mobilization, Spinal mobilization, Cryotherapy, and Moist heat.  PLAN FOR NEXT SESSION: postural strength, incorporate PF education and training with therex, ambulation for endurance, manual therapy  Chonda Baney, PT 04/05/24 1:19 PM

## 2024-04-09 ENCOUNTER — Telehealth: Payer: Self-pay | Admitting: Physician Assistant

## 2024-04-09 DIAGNOSIS — I4819 Other persistent atrial fibrillation: Secondary | ICD-10-CM

## 2024-04-09 MED ORDER — APIXABAN 2.5 MG PO TABS: 2.5000 mg | ORAL_TABLET | Freq: Two times a day (BID) | ORAL | 6 refills | Status: DC

## 2024-04-09 NOTE — Telephone Encounter (Signed)
 Pt called stating she was completely out of eliquis  and needed samples. Unfortunately I do not have access to samples on the weekend. I relayed I can send in 1 month supply and she can fill 3 days and then call on Monday for samples. She expressed understanding and thanked me for the call.   Will send to triage.

## 2024-04-11 ENCOUNTER — Ambulatory Visit: Payer: Medicare Other | Admitting: Physical Therapy

## 2024-04-11 ENCOUNTER — Encounter: Payer: Self-pay | Admitting: Physical Therapy

## 2024-04-11 ENCOUNTER — Other Ambulatory Visit (HOSPITAL_BASED_OUTPATIENT_CLINIC_OR_DEPARTMENT_OTHER): Payer: Self-pay | Admitting: *Deleted

## 2024-04-11 DIAGNOSIS — M62830 Muscle spasm of back: Secondary | ICD-10-CM

## 2024-04-11 DIAGNOSIS — M545 Low back pain, unspecified: Secondary | ICD-10-CM | POA: Diagnosis not present

## 2024-04-11 DIAGNOSIS — R293 Abnormal posture: Secondary | ICD-10-CM

## 2024-04-11 DIAGNOSIS — M546 Pain in thoracic spine: Secondary | ICD-10-CM | POA: Diagnosis not present

## 2024-04-11 DIAGNOSIS — M4125 Other idiopathic scoliosis, thoracolumbar region: Secondary | ICD-10-CM

## 2024-04-11 DIAGNOSIS — G8929 Other chronic pain: Secondary | ICD-10-CM | POA: Diagnosis not present

## 2024-04-11 DIAGNOSIS — M542 Cervicalgia: Secondary | ICD-10-CM | POA: Diagnosis not present

## 2024-04-11 MED ORDER — APIXABAN 2.5 MG PO TABS: 2.5000 mg | ORAL_TABLET | Freq: Two times a day (BID) | ORAL | 0 refills | Status: DC

## 2024-04-11 NOTE — Telephone Encounter (Signed)
 S/w pt is aware 2 boxes of Eliquis  are left up front ( 2.5 mg) for pt pick up.

## 2024-04-11 NOTE — Therapy (Signed)
 OUTPATIENT PHYSICAL THERAPY THORACOLUMBAR PROGRESS NOTE  Progress Note Reporting Period 01/01/24 to 04/11/24  See note below for Objective Data and Assessment of Progress/Goals.    Patient Name: Janet Nguyen MRN: [id number] DOB:06-14-1932, 89 y.o., female Today's Date: 04/11/2024  END OF SESSION:  PT End of Session - 04/11/24 1105     Visit Number 17    Date for PT Re-Evaluation 04/22/24    Authorization Type Medicare part A/B - KX at visit 15    Progress Note Due on Visit 17    PT Start Time 1102    PT Stop Time 1145    PT Time Calculation (min) 43 min    Activity Tolerance Patient tolerated treatment well    Behavior During Therapy [facility] for tasks assessed/performed                         Past Medical History:  Diagnosis Date   Arrhythmia    History of SVT with documented PVC'S and  PAC'S  12/08/12 Nuc stress test normal LV EF 74%  Event Monitor  12/01/12-01/03/13   Atrial flutter (HCC)    Celiac disease    treated by Dr. Luis   GERD (gastroesophageal reflux disease)    Hypertension    Intervertebral disc stenosis of neural canal of cervical region    Irregular heart beat 11/30/2012   ECHO-EF 60-65%   Osteoporosis    PMR (polymyalgia rheumatica) (HCC)    Dr. Balinda; pt states she was diagnosed 10-15 years ago, not treated at this time or any issues that she is aware of.   Scoliosis    Scoliosis    Sleep apnea 10/02/11 [facility]   Sleep study AHI -total sleep 10.3/hr  64.0/ hr during REM sleep.RDI 22.8/hr during total sleep 64.0/hr during REM sleep The lowest O2 sat during Non-REM and REM sleep was 86% and 88% respectively. 04/08/12 CPAP/BIPAP titration study [facility]   Past Surgical History:  Procedure Laterality Date   ANAL RECTAL MANOMETRY N/A 09/16/2023   Procedure: ANO RECTAL MANOMETRY;  Surgeon: Burnette Fallow, MD;  Location: [facility] ENDOSCOPY;  Service: Gastroenterology;  Laterality: N/A;   APPENDECTOMY      ruptured at age 54 and had surgery   CARDIAC CATHETERIZATION  01/27/06   CARDIOVERSION N/A 01/08/2022   Procedure: CARDIOVERSION;  Surgeon: Hobart Powell BRAVO, MD;  Location: [facility] ENDOSCOPY;  Service: Cardiovascular;  Laterality: N/A;   cataract surgery  2015   Dr. Cleatus; March & April 2015   Patient Active Problem List   Diagnosis Date Noted   Dyspnea 05/30/2023   Pleural effusion 05/30/2023   Radon exposure 05/30/2023   Secondary hypercoagulable state (HCC) 12/12/2021   Hyponatremia 12/04/2021   Acute CHF (congestive heart failure) (HCC) 12/02/2021   Chronic diastolic heart failure (HCC) 12/02/2021   Atrial fibrillation with rapid ventricular response (HCC) 12/02/2021   Essential hypertension 12/02/2021   GERD without esophagitis 12/02/2021   Obstructive sleep apnea 12/02/2021   Interstitial lung disease (HCC) 12/02/2021   History of pulmonary embolism 12/02/2021   Pulmonary embolism (HCC) 11/06/2021   Bronchiectasis (HCC) 11/06/2021   Deviated septum 05/08/2020   Mass of subcutaneous tissue 05/02/2020   Vertigo 06/16/2019   Pulmonary hypertension, unspecified (HCC) 04/14/2019   Ischemic colitis (HCC) 04/14/2019   Migraine with aura and without status migrainosus, not intractable 11/08/2018   Degeneration of lumbar intervertebral disc 10/14/2018   Hoarseness of voice 03/04/2018   Abdominal  pain 07/01/2017   Diverticulitis, colon    Metabolic acidosis, increased anion gap    Constipation 04/14/2017   Lumbar hernia 04/14/2017   Presbycusis of both ears 01/10/2017   Tinnitus aurium, bilateral 01/10/2017   Gastroesophageal reflux disease 08/20/2016   Hemoptysis 08/20/2016   Obstructive sleep apnea of adult 08/20/2016   Rhinitis, chronic 08/20/2016   Throat pain in adult 08/20/2016   Fatigue 12/23/2015   Sciatica of right side 10/06/2015   History of migraine headaches 10/06/2015   Frequent PVCs 12/28/2013   Premature atrial contractions 12/28/2013   PSVT (paroxysmal  supraventricular tachycardia) (HCC) 12/28/2013   Heart palpitations 07/13/2013   Sleep apnea 04/11/2013   Scoliosis 04/11/2013   Atypical atrial flutter (HCC) 11/29/2012   Chest pain, atypical 11/29/2012   Fibromyalgia syndrome 11/29/2012   Chronic steroid use 11/29/2012    PCP: Okey Carlin Redbird, MD  REFERRING PROVIDER: Okey Carlin Redbird, MD  REFERRING DIAG: M41.9 (ICD-10-CM) - Scoliosis  Rationale for Evaluation and Treatment: Rehabilitation  THERAPY DIAG:  Abnormal posture  Cervicalgia  Other idiopathic scoliosis, thoracolumbar region  Pain in thoracic spine  Chronic bilateral low back pain without sciatica  Muscle spasm of back  ONSET DATE: chronic, years of progressive pain from degenerative scoliosis  SUBJECTIVE:                                                                                                                                                                                           SUBJECTIVE STATEMENT: I suddenly feel my age.  Stress affects me physically.  I have no help at home and do more than my body can handle.  PERTINENT HISTORY:  Degenerative scoliosis, complex medical history including pulmonary HTN, frequent PVCs and a-flutter, history of cardioversion, osteoporosis Has been SOB with activity On waitlist to get into cardiologist sooner   PAIN:  PAIN:  Are you having pain? Yes NPRS scale: 8/10 Pain location: abdomen, left side of trunk, low back, intermittent bil hips/buttock/knee, Rt foot pains, neck Pain orientation: Other: see above  PAIN TYPE: aching, sharp, and tight Pain description: intermittent and constant  Aggravating factors: varies Relieving factors: unsure   PRECAUTIONS: Other: osteoporosis  RED FLAGS: None   WEIGHT BEARING RESTRICTIONS: No  FALLS:  Has patient fallen in last 6 months? No  LIVING ENVIRONMENT: Lives with: lives with their family and lives alone Lives in: House/apartment Stairs: No Has  following equipment at home: has cane and walker but does not use  OCCUPATION: retired  PLOF: Independent with basic ADLs, Independent with household mobility without device, Independent with community mobility without device, and Needs assistance with homemaking  PATIENT GOALS:  be able to eat, improve strength for bowel control, feel less pain and more energy to expand social outlets and walking  NEXT MD VISIT: as needed  OBJECTIVE:  Note: Objective measures were completed at Evaluation unless otherwise noted.  DIAGNOSTIC FINDINGS:    PATIENT SURVEYS:  PSEQ: 33/60 (<30 = low self-efficacy) Pain Catastrophizing Scale: 17 (>30 = clinically signif level of pain catastrophizing)  COGNITION: Overall cognitive status: intact with improved outlook     SENSATION: WFL  MUSCLE LENGTH: 5/30: see palpation for updates 4/4 See palpation for updates Eval: Adaptive shortening of Lt obliques, QL, lat secondary to rigid scoliosis  POSTURE: rounded shoulders, forward head, decreased lumbar lordosis, and increased thoracic kyphosis, severe degenerative scoliosis with Lt SB/Rt Rot, left ribcage in near contact with left ilium  PALPATION: 5/30: Improved soft tissue extensibility along Lt side of trunk, improving lateral costal expansion along Lt side of trunk, improving gut sounds and motility along Lt side of trunk, reducing trigger points in Lt lat and shoulder  4/4:  Improving mobility of soft tissues along Lt side of trunk allowing reduced pressure on bowels, bladder, stomach to allow improved bowel movements, control of bowel and bladder Improving lateral costal expansion along Lt side of trunk for improved depth of inhale and stretching of intercostals  Eval:  Spasm present in bil upper traps Fascial and muscle mobility restriction along Lt side of trunk due to adaptive shortening from scoliosis: obliques, QL, hip flexors, lat  LUMBAR ROM:   AROM eval 01/01/24 02/26/24  Flexion full  full full  Extension NT NT NT  Right lateral flexion 20% 30% 40%  Left lateral flexion 40% 50% 75%  Right rotation 40% 50% 50%  Left rotation 20% 20% 30%   (Blank rows = not tested)  LOWER EXTREMITY ROM:    WFL, flexion is full with good hamstring length   LOWER EXTREMITY MMT:   4/4: hip abd and ER 4/5 Eval: Grossly 4/5 throughout, 4-/5 hip abduction, ER  FUNCTIONAL TESTS:    GAIT: Distance walked: within clinic Assistive device utilized: None Level of assistance: Modified independence Comments: short stride length  TREATMENT DATE:  04/11/24: Rt SL manual therapy - broadening and elongation of Lt obliques, QL, intercostals, paraspinals from cervical to lumbar, gluteals, hip flexors.  Gentle sacral distraction. Supine core: bicycle, scissors, march alt LE x10 each Supine HS stretch with ankle pumps bil x30 Seated in chair posterior bil upper quadrant stripping, passive pec stretch bil  04/05/24: Rt SL manual therapy - broadening and elongation of Lt obliques, QL, intercostals, paraspinals from cervical to lumbar, gluteals, hip flexors.  Gentle sacral distraction. Supine core: bicycle, scissors, march alt LE x10 each Supine HS stretch with ankle pumps bil x30 DKTC x 30 LTR x 10 Seated in chair posterior bil upper quadrant stripping, passive pec stretch bil  03/11/24 Rt SL manual therapy - broadening and elongation of Lt obliques, QL, intercostals, paraspinals from cervical to lumbar, gluteals, hip flexors.  Gentle sacral distraction. Supine core: bicycle, scissors, march alt LE x10 each Seated in chair posterior bil upper quadrant stripping, passive pec stretch bil Seated UE reach up and over to stretch Lt side of trunk 3x20 Seated UE horiz abd, row, shoulder ext x10 each Seated ball squeeze with PF lift 10x5  PATIENT EDUCATION:  Education details: Pt education (see above) Person educated:  Patient Education method: Explanation Education comprehension: verbalized understanding  HOME EXERCISE PROGRAM: Journaling to outline some activities to try Pelvic floor - select exercises from past HEP Supine LE stretches from past HEP  Access Code: [email or web address] URL: [email or web address] Date: 12/25/2023 Prepared by: Orvil Wilhelmine Krogstad  Exercises - Sidelying Diaphragmatic Breathing  - 1 x daily - 7 x weekly - 1 sets - 10 reps - Seated Hip Abduction with Resistance  - 1 x daily - 7 x weekly - 2 sets - 10 reps - Standing Row with Anchored Resistance  - 1 x daily - 7 x weekly - 2 sets - 10 reps - Standing Shoulder Extension with Resistance  - 1 x daily - 7 x weekly - 2 sets - 10 reps  ASSESSMENT:  CLINICAL IMPRESSION: Pt was feeling worn out today as she becomes overwhelmed at times with stress of maintaining her home and her various doctor appointments withtout support.  This stress seems to spill over to her physical well-being and respiratory system where she will become more short of breath and have amplified pain experience.  Overall, with more consistent PT, she reports she is tolerating all household, yard and errands with less pain most of the time.She demos improving mobility, exercise tolerance and soft tissue extensibility along Lt side of trunk.  She continues to have limited endurance secondary to severe degenerative scoliosis compressing Lt lung and diaphragm. She will continue to benefit from PT to manage pain and optimize systems affected severe compression from degenerative scoliosis (GI, cardiovascular, musculoskeletal).    OBJECTIVE IMPAIRMENTS: cardiopulmonary status limiting activity, decreased activity tolerance, decreased coordination, decreased endurance, decreased mobility, decreased ROM, decreased strength, hypomobility, increased fascial restrictions, increased muscle spasms, impaired flexibility, impaired tone, improper body mechanics, postural dysfunction,  and pain.   ACTIVITY LIMITATIONS: carrying, lifting, bending, sitting, standing, squatting, sleeping, stairs, transfers, bed mobility, continence, bathing, dressing, and locomotion level  PARTICIPATION LIMITATIONS: meal prep, cleaning, laundry, shopping, and community activity  PERSONAL FACTORS: Age, Behavior pattern, Time since onset of injury/illness/exacerbation, and 1-2 comorbidities: pulmonary HTN, a-flutter, osteoporosis, severe degenerative scoliosis are also affecting patient's functional outcome.   REHAB POTENTIAL: Good  CLINICAL DECISION MAKING: Evolving/moderate complexity  EVALUATION COMPLEXITY: Moderate   GOALS: Goals reviewed with patient? Yes  SHORT TERM GOALS: Target date: 11/27/23  Pt will journal and discover at least 1 activity to return to that she enjoys Baseline: Goal status: met, watching movies with neighbors 3/7  2.  Pt will strive to eat consistent small meals throughout the day to initiate a bowel routine Baseline:  Goal status: met 4/4, most days  3.  Pt will report 20% less pain around abdomen and trunk to allow for comfort with small meals Baseline:  Goal status: met for most days 4/4    LONG TERM GOALS: Target date: 01/01/24  Pt will return to walking program at least 2x/week for multi-system health benefits. Baseline:  Goal status: will walk one lap around neighborhood 1-2x/day and participate in short errands  2.  Pt will be ind with HEP for gluteal and pelvic floor strength to reduce incidence of bowel leakage. Baseline:  Goal status: ongoing 5/30, notes about 50% less leakage  3.  Pt will improve score of PSEQ to at least 40 to demo improved self-efficacy within chronic pain. Baseline: 33 Goal status: ongoing  4.  Pt will establish nighttime routine including [employer] app, medidation, breathing and stretching routine to improve sleep  stretches. Baseline:  Goal status: MET 5/30, has established consistent routine  5.  Pt will report at  least 50% less pain with daily tasks and eating secondary to reduced soft tissue compression surrounding scoliosis. Baseline:  Goal status: ONGOING 5/30, reports 50% improvement a few days of the week    PLAN:  PT FREQUENCY: 1-2x/week  PT DURATION: 8 weeks  PLANNED INTERVENTIONS: 97110-Therapeutic exercises, 97530- Therapeutic activity, 97112- Neuromuscular re-education, 97535- Self Care, [id number]- Manual therapy, Dry Needling, Joint mobilization, Spinal mobilization, Cryotherapy, and Moist heat.  PLAN FOR NEXT SESSION: postural strength, incorporate PF education and training with therex, ambulation for endurance, manual therapy  Sadae Arrazola, PT 04/11/24 12:05 PM

## 2024-04-12 ENCOUNTER — Encounter: Admitting: Physical Therapy

## 2024-04-13 ENCOUNTER — Telehealth: Payer: Self-pay | Admitting: Internal Medicine

## 2024-04-13 NOTE — Telephone Encounter (Signed)
 Called patient to advise on sample request,per last AVS Spiriva  1.25 was meant to be given that was not given and I called to tell her that we havr them to come back,She states it was for her Eliquis  and that she needs her eliquis .I did not see anything in note on smaples of eliquis .Patiens was a little frustrated and not feeling well and hung up on me.Per last note in reference to eliquis ,ramaswamy said from a PE standpoint eliquis  is not needed.And that she should try low dose Xarelto instead.She was meant to speak with her cardiologists prior to deciding.Was unable to continue with samples of spirvia will route for ramaswamy as a fyi

## 2024-04-13 NOTE — Telephone Encounter (Signed)
 PT states she forgot samples at last appt. In E2C2 message did not state which samples. Please call PT to advise.

## 2024-04-13 NOTE — Telephone Encounter (Signed)
 I called and left a detailed message informing patient that in regards to the eliqus she was meant to reach out to her cardiologists to decide between low dos Xarelto or Eliquis ,since from a PE standpoint eliquis  is not needed but may be needed for a fribrillation.And if she wanted the spiriva  samples as noted to give in last ov to give our office a call to request them if we have them as we have limited supply

## 2024-04-14 DIAGNOSIS — L03011 Cellulitis of right finger: Secondary | ICD-10-CM | POA: Diagnosis not present

## 2024-04-16 NOTE — Telephone Encounter (Signed)
 She asked for samples of eliquis  whic we do no thave  + anticoagilation is now for A Fib and is a cardiology question. I have told her this many many times  Also she used to be my patient and she changed to [employer]. She made an acute visit with me  Whenever she comes we give her spiriva  samples b- if she wants one more that is fine but otherwise  it is her decision -   Given fact effort was already made to reach her and communicate = - closing this message

## 2024-04-28 DIAGNOSIS — L299 Pruritus, unspecified: Secondary | ICD-10-CM | POA: Diagnosis not present

## 2024-04-28 DIAGNOSIS — Z6821 Body mass index (BMI) 21.0-21.9, adult: Secondary | ICD-10-CM | POA: Diagnosis not present

## 2024-04-28 DIAGNOSIS — I4891 Unspecified atrial fibrillation: Secondary | ICD-10-CM | POA: Diagnosis not present

## 2024-04-28 DIAGNOSIS — R82998 Other abnormal findings in urine: Secondary | ICD-10-CM | POA: Diagnosis not present

## 2024-04-29 ENCOUNTER — Encounter: Payer: Self-pay | Admitting: Physical Therapy

## 2024-04-29 ENCOUNTER — Ambulatory Visit: Payer: Medicare Other | Attending: Family Medicine | Admitting: Physical Therapy

## 2024-04-29 DIAGNOSIS — M4125 Other idiopathic scoliosis, thoracolumbar region: Secondary | ICD-10-CM | POA: Insufficient documentation

## 2024-04-29 DIAGNOSIS — M545 Low back pain, unspecified: Secondary | ICD-10-CM | POA: Diagnosis not present

## 2024-04-29 DIAGNOSIS — R293 Abnormal posture: Secondary | ICD-10-CM | POA: Insufficient documentation

## 2024-04-29 DIAGNOSIS — M542 Cervicalgia: Secondary | ICD-10-CM | POA: Insufficient documentation

## 2024-04-29 DIAGNOSIS — M546 Pain in thoracic spine: Secondary | ICD-10-CM | POA: Diagnosis not present

## 2024-04-29 DIAGNOSIS — M62830 Muscle spasm of back: Secondary | ICD-10-CM | POA: Insufficient documentation

## 2024-04-29 DIAGNOSIS — M6281 Muscle weakness (generalized): Secondary | ICD-10-CM | POA: Diagnosis not present

## 2024-04-29 DIAGNOSIS — G8929 Other chronic pain: Secondary | ICD-10-CM | POA: Diagnosis not present

## 2024-04-29 NOTE — Therapy (Signed)
 OUTPATIENT PHYSICAL THERAPY THORACOLUMBAR TREATMENT    Patient Name: CERENITY GOSHORN MRN: [id number] DOB:Mar 10, 1932, 89 y.o., female Today's Date: 04/29/2024  END OF SESSION:  PT End of Session - 04/29/24 1107     Visit Number 18    Date for PT Re-Evaluation 08/05/24    Authorization Type Medicare part A/B - KX at visit 15    Progress Note Due on Visit 27    PT Start Time 1105    PT Stop Time 1145    PT Time Calculation (min) 40 min    Activity Tolerance Patient tolerated treatment well    Behavior During Therapy [facility] for tasks assessed/performed                          Past Medical History:  Diagnosis Date   Arrhythmia    History of SVT with documented PVC'S and  PAC'S  12/08/12 Nuc stress test normal LV EF 74%  Event Monitor  12/01/12-01/03/13   Atrial flutter (HCC)    Celiac disease    treated by Dr. Luis   GERD (gastroesophageal reflux disease)    Hypertension    Intervertebral disc stenosis of neural canal of cervical region    Irregular heart beat 11/30/2012   ECHO-EF 60-65%   Osteoporosis    PMR (polymyalgia rheumatica) (HCC)    Dr. Balinda; pt states she was diagnosed 10-15 years ago, not treated at this time or any issues that she is aware of.   Scoliosis    Scoliosis    Sleep apnea 10/02/11 [facility]   Sleep study AHI -total sleep 10.3/hr  64.0/ hr during REM sleep.RDI 22.8/hr during total sleep 64.0/hr during REM sleep The lowest O2 sat during Non-REM and REM sleep was 86% and 88% respectively. 04/08/12 CPAP/BIPAP titration study [facility]   Past Surgical History:  Procedure Laterality Date   ANAL RECTAL MANOMETRY N/A 09/16/2023   Procedure: ANO RECTAL MANOMETRY;  Surgeon: Burnette Fallow, MD;  Location: [facility];  Service: Gastroenterology;  Laterality: N/A;   APPENDECTOMY     ruptured at age 51 and had surgery   CARDIAC CATHETERIZATION  01/27/06   CARDIOVERSION N/A 01/08/2022   Procedure:  CARDIOVERSION;  Surgeon: Hobart Powell BRAVO, MD;  Location: [facility];  Service: Cardiovascular;  Laterality: N/A;   cataract surgery  2015   Dr. Cleatus; March & April 2015   Patient Active Problem List   Diagnosis Date Noted   Dyspnea 05/30/2023   Pleural effusion 05/30/2023   Radon exposure 05/30/2023   Secondary hypercoagulable state (HCC) 12/12/2021   Hyponatremia 12/04/2021   Acute CHF (congestive heart failure) (HCC) 12/02/2021   Chronic diastolic heart failure (HCC) 12/02/2021   Atrial fibrillation with rapid ventricular response (HCC) 12/02/2021   Essential hypertension 12/02/2021   GERD without esophagitis 12/02/2021   Obstructive sleep apnea 12/02/2021   Interstitial lung disease (HCC) 12/02/2021   History of pulmonary embolism 12/02/2021   Pulmonary embolism (HCC) 11/06/2021   Bronchiectasis (HCC) 11/06/2021   Deviated septum 05/08/2020   Mass of subcutaneous tissue 05/02/2020   Vertigo 06/16/2019   Pulmonary hypertension, unspecified (HCC) 04/14/2019   Ischemic colitis (HCC) 04/14/2019   Migraine with aura and without status migrainosus, not intractable 11/08/2018   Degeneration of lumbar intervertebral disc 10/14/2018   Hoarseness of voice 03/04/2018   Abdominal pain 07/01/2017   Diverticulitis, colon    Metabolic acidosis, increased anion gap    Constipation 04/14/2017  Lumbar hernia 04/14/2017   Presbycusis of both ears 01/10/2017   Tinnitus aurium, bilateral 01/10/2017   Gastroesophageal reflux disease 08/20/2016   Hemoptysis 08/20/2016   Obstructive sleep apnea of adult 08/20/2016   Rhinitis, chronic 08/20/2016   Throat pain in adult 08/20/2016   Fatigue 12/23/2015   Sciatica of right side 10/06/2015   History of migraine headaches 10/06/2015   Frequent PVCs 12/28/2013   Premature atrial contractions 12/28/2013   PSVT (paroxysmal supraventricular tachycardia) (HCC) 12/28/2013   Heart palpitations 07/13/2013   Sleep apnea 04/11/2013   Scoliosis  04/11/2013   Atypical atrial flutter (HCC) 11/29/2012   Chest pain, atypical 11/29/2012   Fibromyalgia syndrome 11/29/2012   Chronic steroid use 11/29/2012    PCP: Okey Carlin Redbird, MD  REFERRING PROVIDER: Okey Carlin Redbird, MD  REFERRING DIAG: M41.9 (ICD-10-CM) - Scoliosis  Rationale for Evaluation and Treatment: Rehabilitation  THERAPY DIAG:  Abnormal posture  Cervicalgia  Other idiopathic scoliosis, thoracolumbar region  Pain in thoracic spine  Chronic bilateral low back pain without sciatica  Muscle spasm of back  ONSET DATE: chronic, years of progressive pain from degenerative scoliosis  SUBJECTIVE:                                                                                                                                                                                           SUBJECTIVE STATEMENT: I miss the benefits of PT so much when I don't have it for a week or two.  It helps me more than you know.  PERTINENT HISTORY:  Degenerative scoliosis, complex medical history including pulmonary HTN, frequent PVCs and a-flutter, history of cardioversion, osteoporosis Has been SOB with activity On waitlist to get into cardiologist sooner   PAIN:  PAIN:  Are you having pain? Yes NPRS scale: 4-7/10 Pain location: abdomen, left side of trunk, low back, intermittent bil hips/buttock/knee, Rt foot pains, neck Pain orientation: Other: see above  PAIN TYPE: aching, sharp, and tight Pain description: intermittent and constant  Aggravating factors: varies Relieving factors: unsure   PRECAUTIONS: Other: osteoporosis  RED FLAGS: None   WEIGHT BEARING RESTRICTIONS: No  FALLS:  Has patient fallen in last 6 months? No  LIVING ENVIRONMENT: Lives with: lives with their family and lives alone Lives in: House/apartment Stairs: No Has following equipment at home: has cane and walker but does not use  OCCUPATION: retired  PLOF: Independent with basic ADLs,  Independent with household mobility without device, Independent with community mobility without device, and Needs assistance with homemaking  PATIENT GOALS:  be able to eat, improve strength for bowel control, feel less pain and more energy to expand  social outlets and walking  NEXT MD VISIT: as needed  OBJECTIVE:  Note: Objective measures were completed at Evaluation unless otherwise noted.    COGNITION: Overall cognitive status: intact with improved outlook     SENSATION: WFL  MUSCLE LENGTH: 04/29/24: see palpation for updates 5/30: see palpation for updates 4/4 See palpation for updates Eval: Adaptive shortening of Lt obliques, QL, lat secondary to rigid scoliosis  POSTURE: rounded shoulders, forward head, decreased lumbar lordosis, and increased thoracic kyphosis, severe degenerative scoliosis with Lt SB/Rt Rot, left ribcage in near contact with left ilium  PALPATION: 04/29/24: Pt will improved soft tissue extensibility along Lt side of trunk, improving lateral costal expansion along Lt side of trunk, reducing trigger points in bil gluteals, presence of painful trigger points in Rt upper trap, levator and rhomboids today  5/30: Improved soft tissue extensibility along Lt side of trunk, improving lateral costal expansion along Lt side of trunk, improving gut sounds and motility along Lt side of trunk, reducing trigger points in Lt lat and shoulder  4/4:  Improving mobility of soft tissues along Lt side of trunk allowing reduced pressure on bowels, bladder, stomach to allow improved bowel movements, control of bowel and bladder Improving lateral costal expansion along Lt side of trunk for improved depth of inhale and stretching of intercostals  Eval:  Spasm present in bil upper traps Fascial and muscle mobility restriction along Lt side of trunk due to adaptive shortening from scoliosis: obliques, QL, hip flexors, lat  LUMBAR ROM:   AROM eval 01/01/24 02/26/24 04/29/24  Flexion  full full full full  Extension NT NT NT NT  Right lateral flexion 20% 30% 40% 50%  Left lateral flexion 40% 50% 75% 75%  Right rotation 40% 50% 50% 75%  Left rotation 20% 20% 30% 50%   (Blank rows = not tested)  LOWER EXTREMITY ROM:    WFL, flexion is full with good hamstring length   LOWER EXTREMITY MMT:   04/29/24: hip abd and ER 4+/5, scapular stabilizers 4/5 4/4: hip abd and ER 4/5 Eval: Grossly 4/5 throughout, 4-/5 hip abduction, ER  FUNCTIONAL TESTS:    GAIT: Distance walked: within clinic Assistive device utilized: None Level of assistance: Modified independence Comments: short stride length  TREATMENT DATE:  04/29/24: Seated STM Rt trigger point release to upper trap, levator and rhomboids, Lt lat and posterior deltoid, bil neck STM Rt SL manual therapy - broadening and elongation of Lt obliques, QL, intercostals, paraspinals from cervical to lumbar, gluteals, hip flexors.  Gentle sacral distraction. Rt SL tactile cues with initial pressure and spring off for lateral costal expansion deep slow breathing with overhead reach of Lt UE and long leg reach of Lt LE Supine core: bicycle, scissors, march, SLR alt LE x10 each Supine HS stretch with ankle pumps bil x30 Standing green band x 15 each: bil ER, horiz abd, shoulder ext  04/11/24: Rt SL manual therapy - broadening and elongation of Lt obliques, QL, intercostals, paraspinals from cervical to lumbar, gluteals, hip flexors.  Gentle sacral distraction. Supine core: bicycle, scissors, march alt LE x10 each Supine HS stretch with ankle pumps bil x30 Seated in chair posterior bil upper quadrant stripping, passive pec stretch bil  04/05/24: Rt SL manual therapy - broadening and elongation of Lt obliques, QL, intercostals, paraspinals from cervical to lumbar, gluteals, hip flexors.  Gentle sacral distraction. Supine core: bicycle, scissors, march alt LE x10 each Supine HS stretch with ankle pumps bil x30 DKTC x 30 LTR x  10 Seated in chair posterior bil upper quadrant stripping, passive pec stretch bil  03/11/24 Rt SL manual therapy - broadening and elongation of Lt obliques, QL, intercostals, paraspinals from cervical to lumbar, gluteals, hip flexors.  Gentle sacral distraction. Supine core: bicycle, scissors, march alt LE x10 each Seated in chair posterior bil upper quadrant stripping, passive pec stretch bil Seated UE reach up and over to stretch Lt side of trunk 3x20 Seated UE horiz abd, row, shoulder ext x10 each Seated ball squeeze with PF lift 10x5                                                                              PATIENT EDUCATION:  Education details: Pt education (see above) Person educated: Patient Education method: Explanation Education comprehension: verbalized understanding  HOME EXERCISE PROGRAM: Journaling to outline some activities to try Pelvic floor - select exercises from past HEP Supine LE stretches from past HEP  Access Code: [email or web address] URL: [email or web address] Date: 12/25/2023 Prepared by: Orvil Bow Buntyn  Exercises - Sidelying Diaphragmatic Breathing  - 1 x daily - 7 x weekly - 1 sets - 10 reps - Seated Hip Abduction with Resistance  - 1 x daily - 7 x weekly - 2 sets - 10 reps - Standing Row with Anchored Resistance  - 1 x daily - 7 x weekly - 2 sets - 10 reps - Standing Shoulder Extension with Resistance  - 1 x daily - 7 x weekly - 2 sets - 10 reps  ASSESSMENT:  CLINICAL IMPRESSION: Pt demonstrates improving soft tissue extensibility along Lt side of trunk.  She is having more consistent gut motility and digestion with a more consistent bowel routine.  Some days she feels that her cardiovascular endurance is improved as we have opened up the Lt side of her trunk.  She did have some upper quadrant TP today Rt>Lt which benefited from soft tissue techniques.  She continues to work on her postural strength and mobility within sessions and at home.  Overall, with more consistent PT, she reports she is tolerating all household, yard and errands with less pain most of the time. She gets multi-body system benefits from consistent PT as her severe degenerative scoliosis affects gut motility, cardiovascular function and musculoskeletal function due to a very compressed Lt side of trunk.  She will continue to benefit from PT to manage pain, maintain gains made in PT, and optimize systems affected severe compression from degenerative scoliosis (GI, cardiovascular, musculoskeletal).    OBJECTIVE IMPAIRMENTS: cardiopulmonary status limiting activity, decreased activity tolerance, decreased coordination, decreased endurance, decreased mobility, decreased ROM, decreased strength, hypomobility, increased fascial restrictions, increased muscle spasms, impaired flexibility, impaired tone, improper body mechanics, postural dysfunction, and pain.   ACTIVITY LIMITATIONS: carrying, lifting, bending, sitting, standing, squatting, sleeping, stairs, transfers, bed mobility, continence, bathing, dressing, and locomotion level  PARTICIPATION LIMITATIONS: meal prep, cleaning, laundry, shopping, and community activity  PERSONAL FACTORS: Age, Behavior pattern, Time since onset of injury/illness/exacerbation, and 1-2 comorbidities: pulmonary HTN, a-flutter, osteoporosis, severe degenerative scoliosis are also affecting patient's functional outcome.   REHAB POTENTIAL: Good  CLINICAL DECISION MAKING: Evolving/moderate complexity  EVALUATION COMPLEXITY: Moderate   GOALS: Goals reviewed with patient? Yes  SHORT TERM GOALS:  Target date: 11/27/23  Pt will journal and discover at least 1 activity to return to that she enjoys Baseline: Goal status: met, watching movies with neighbors 3/7  2.  Pt will strive to eat consistent small meals throughout the day to initiate a bowel routine Baseline:  Goal status: met 4/4, most days  3.  Pt will report 20% less pain around  abdomen and trunk to allow for comfort with small meals Baseline:  Goal status: met for most days 4/4    LONG TERM GOALS: Target date: 08/05/24  Pt will return to walking program at least 2x/week for multi-system health benefits. Baseline:  Goal status: will walk one lap around neighborhood 1-2x/day and participate in short errands  2.  Pt will be ind with HEP for gluteal and pelvic floor strength to reduce incidence of bowel leakage. Baseline:  Goal status: ongoing 8/1, notes about 75% less leakage  3.  Pt will establish nighttime routine including [employer] app, medidation, breathing and stretching routine to improve sleep stretches. Baseline:  Goal status: MET 5/30, has established consistent routine  4.  Pt will report at least 50% less pain with daily tasks and eating secondary to reduced soft tissue compression surrounding scoliosis. Baseline:  Goal status: ONGOING 5/30, reports 50% improvement a few days of the week    PLAN:  PT FREQUENCY: 1-2x/week  PT DURATION: 8 weeks  PLANNED INTERVENTIONS: 97110-Therapeutic exercises, 97530- Therapeutic activity, 97112- Neuromuscular re-education, 97535- Self Care, [id number]- Manual therapy, Dry Needling, Joint mobilization, Spinal mobilization, Cryotherapy, and Moist heat.  PLAN FOR NEXT SESSION: postural strength, incorporate PF education and training with therex, ambulation for endurance, manual therapy  Youlanda Tomassetti, PT 04/29/24 12:27 PM

## 2024-05-04 ENCOUNTER — Ambulatory Visit: Payer: Self-pay | Admitting: Family Medicine

## 2024-05-04 NOTE — Telephone Encounter (Signed)
 FYI Only or Action Required?: FYI only for provider.  Janet Nguyen is followed in Pulmonology for shortness of breath, last seen on 03/29/2024 by Janet Amel, MD.  Called Nurse Triage reporting Shortness of Breath.  Symptoms began several years ago.  Triage Disposition: See PCP Within 2 Weeks  Janet Nguyen/caregiver understands and will follow disposition?: Yes            Copied from CRM [id number]. Topic: Clinical - Red Word Triage >> May 04, 2024 10:00 AM [name] wrote: [facility] that prompted transfer to Nurse Triage: CHEST PAIN, SHORTNESS OF BREATH Reason for Disposition  [1] MILD longstanding difficulty breathing (e.g., minimal/no SOB at rest, SOB with walking, pulse < 100) AND [2] SAME as normal  Answer Assessment - Initial Assessment Questions This RN scheduled Janet Nguyen for an appointment on 8/18 with Dr. Kassie. Janet Nguyen refuses to go to another office location or see another provider. Janet Nguyen wants to keep her Sep appointment scheduled at this time. This RN had difficulty getting all of the triage questions answered as Janet Nguyen had other calls coming in causing distractions for Janet Nguyen. This RN educated Janet Nguyen on new-worsening symptoms and when to call back/seek emergent care. Janet Nguyen verbalized understanding and agrees to plan.    RESPIRATORY STATUS: Describe your breathing? (e.g., wheezing, shortness of breath, unable to speak, severe coughing)      Coughing, winded, SOB  ONSET: When did this breathing problem begin?      Started in past year and has not gotten worse but not better; Janet Nguyen has appt in Sep but wants to be seen sooner if possible  PATTERN Does the difficult breathing come and go, or has it been constant since it started?     Depends on what Janet Nguyen is doing  OTHER SYMPTOMS: Do you have any other symptoms? (e.g., chest pain, cough, dizziness, fever, runny nose)     CP- hard to explain in conversation; unable to get a definite answer if Janet Nguyen is having cp or not; Janet Nguyen explains she has  scoliosis  Protocols used: Breathing Difficulty-A-AH

## 2024-05-04 NOTE — Telephone Encounter (Signed)
 FYI pt has asked for sooner appt than Sept so appt made for 8/18 and wishes to see no one but you

## 2024-05-04 NOTE — Telephone Encounter (Signed)
 Pt has an appt already on 05/16/2024

## 2024-05-06 ENCOUNTER — Ambulatory Visit: Payer: Medicare Other | Admitting: Physical Therapy

## 2024-05-06 ENCOUNTER — Encounter: Payer: Self-pay | Admitting: Physical Therapy

## 2024-05-06 DIAGNOSIS — G8929 Other chronic pain: Secondary | ICD-10-CM | POA: Diagnosis not present

## 2024-05-06 DIAGNOSIS — M62830 Muscle spasm of back: Secondary | ICD-10-CM

## 2024-05-06 DIAGNOSIS — M546 Pain in thoracic spine: Secondary | ICD-10-CM | POA: Diagnosis not present

## 2024-05-06 DIAGNOSIS — M6281 Muscle weakness (generalized): Secondary | ICD-10-CM

## 2024-05-06 DIAGNOSIS — R293 Abnormal posture: Secondary | ICD-10-CM | POA: Diagnosis not present

## 2024-05-06 DIAGNOSIS — M542 Cervicalgia: Secondary | ICD-10-CM | POA: Diagnosis not present

## 2024-05-06 DIAGNOSIS — M4125 Other idiopathic scoliosis, thoracolumbar region: Secondary | ICD-10-CM | POA: Diagnosis not present

## 2024-05-06 DIAGNOSIS — M545 Low back pain, unspecified: Secondary | ICD-10-CM

## 2024-05-06 NOTE — Therapy (Signed)
 OUTPATIENT PHYSICAL THERAPY THORACOLUMBAR TREATMENT    Patient Name: Janet Nguyen MRN: [id number] DOB:14-Dec-1931, 89 y.o., female Today's Date: 05/06/2024  END OF SESSION:  PT End of Session - 05/06/24 1102     Visit Number 19    Date for PT Re-Evaluation 08/05/24    Authorization Type Medicare part A/B - KX at visit 15    Progress Note Due on Visit 27    PT Start Time 1102    PT Stop Time 1150    PT Time Calculation (min) 48 min    Activity Tolerance Patient tolerated treatment well    Behavior During Therapy [facility] for tasks assessed/performed                          Past Medical History:  Diagnosis Date   Arrhythmia    History of SVT with documented PVC'S and  PAC'S  12/08/12 Nuc stress test normal LV EF 74%  Event Monitor  12/01/12-01/03/13   Atrial flutter (HCC)    Celiac disease    treated by Dr. Luis   GERD (gastroesophageal reflux disease)    Hypertension    Intervertebral disc stenosis of neural canal of cervical region    Irregular heart beat 11/30/2012   ECHO-EF 60-65%   Osteoporosis    PMR (polymyalgia rheumatica) (HCC)    Dr. Balinda; pt states she was diagnosed 10-15 years ago, not treated at this time or any issues that she is aware of.   Scoliosis    Scoliosis    Sleep apnea 10/02/11 [facility]   Sleep study AHI -total sleep 10.3/hr  64.0/ hr during REM sleep.RDI 22.8/hr during total sleep 64.0/hr during REM sleep The lowest O2 sat during Non-REM and REM sleep was 86% and 88% respectively. 04/08/12 CPAP/BIPAP titration study [facility]   Past Surgical History:  Procedure Laterality Date   ANAL RECTAL MANOMETRY N/A 09/16/2023   Procedure: ANO RECTAL MANOMETRY;  Surgeon: Burnette Fallow, MD;  Location: [facility] ENDOSCOPY;  Service: Gastroenterology;  Laterality: N/A;   APPENDECTOMY     ruptured at age 70 and had surgery   CARDIAC CATHETERIZATION  01/27/06   CARDIOVERSION N/A 01/08/2022   Procedure:  CARDIOVERSION;  Surgeon: Hobart Powell BRAVO, MD;  Location: [facility] ENDOSCOPY;  Service: Cardiovascular;  Laterality: N/A;   cataract surgery  2015   Dr. Cleatus; March & April 2015   Patient Active Problem List   Diagnosis Date Noted   Dyspnea 05/30/2023   Pleural effusion 05/30/2023   Radon exposure 05/30/2023   Secondary hypercoagulable state (HCC) 12/12/2021   Hyponatremia 12/04/2021   Acute CHF (congestive heart failure) (HCC) 12/02/2021   Chronic diastolic heart failure (HCC) 12/02/2021   Atrial fibrillation with rapid ventricular response (HCC) 12/02/2021   Essential hypertension 12/02/2021   GERD without esophagitis 12/02/2021   Obstructive sleep apnea 12/02/2021   Interstitial lung disease (HCC) 12/02/2021   History of pulmonary embolism 12/02/2021   Pulmonary embolism (HCC) 11/06/2021   Bronchiectasis (HCC) 11/06/2021   Deviated septum 05/08/2020   Mass of subcutaneous tissue 05/02/2020   Vertigo 06/16/2019   Pulmonary hypertension, unspecified (HCC) 04/14/2019   Ischemic colitis (HCC) 04/14/2019   Migraine with aura and without status migrainosus, not intractable 11/08/2018   Degeneration of lumbar intervertebral disc 10/14/2018   Hoarseness of voice 03/04/2018   Abdominal pain 07/01/2017   Diverticulitis, colon    Metabolic acidosis, increased anion gap    Constipation 04/14/2017  Lumbar hernia 04/14/2017   Presbycusis of both ears 01/10/2017   Tinnitus aurium, bilateral 01/10/2017   Gastroesophageal reflux disease 08/20/2016   Hemoptysis 08/20/2016   Obstructive sleep apnea of adult 08/20/2016   Rhinitis, chronic 08/20/2016   Throat pain in adult 08/20/2016   Fatigue 12/23/2015   Sciatica of right side 10/06/2015   History of migraine headaches 10/06/2015   Frequent PVCs 12/28/2013   Premature atrial contractions 12/28/2013   PSVT (paroxysmal supraventricular tachycardia) (HCC) 12/28/2013   Heart palpitations 07/13/2013   Sleep apnea 04/11/2013   Scoliosis  04/11/2013   Atypical atrial flutter (HCC) 11/29/2012   Chest pain, atypical 11/29/2012   Fibromyalgia syndrome 11/29/2012   Chronic steroid use 11/29/2012    PCP: Okey Carlin Redbird, MD  REFERRING PROVIDER: Okey Carlin Redbird, MD  REFERRING DIAG: M41.9 (ICD-10-CM) - Scoliosis  Rationale for Evaluation and Treatment: Rehabilitation  THERAPY DIAG:  Abnormal posture  Cervicalgia  Other idiopathic scoliosis, thoracolumbar region  Pain in thoracic spine  Chronic bilateral low back pain without sciatica  Muscle spasm of back  Muscle weakness (generalized)  ONSET DATE: chronic, years of progressive pain from degenerative scoliosis  SUBJECTIVE:                                                                                                                                                                                           SUBJECTIVE STATEMENT: I am on anti-biotics and am having some side effects today (GI).  I continue to be so grateful for PT and how therapeutic it is for me.  PERTINENT HISTORY:  Degenerative scoliosis, complex medical history including pulmonary HTN, frequent PVCs and a-flutter, history of cardioversion, osteoporosis Has been SOB with activity On waitlist to get into cardiologist sooner   PAIN:  PAIN:  Are you having pain? Yes NPRS scale: 4-7/10 Pain location: abdomen, left side of trunk, low back, intermittent bil hips/buttock/knee, Rt foot pains, neck Pain orientation: Other: see above  PAIN TYPE: aching, sharp, and tight Pain description: intermittent and constant  Aggravating factors: varies Relieving factors: unsure   PRECAUTIONS: Other: osteoporosis  RED FLAGS: None   WEIGHT BEARING RESTRICTIONS: No  FALLS:  Has patient fallen in last 6 months? No  LIVING ENVIRONMENT: Lives with: lives with their family and lives alone Lives in: House/apartment Stairs: No Has following equipment at home: has cane and walker but does not  use  OCCUPATION: retired  PLOF: Independent with basic ADLs, Independent with household mobility without device, Independent with community mobility without device, and Needs assistance with homemaking  PATIENT GOALS:  be able to eat, improve strength for bowel control, feel less  pain and more energy to expand social outlets and walking  NEXT MD VISIT: as needed  OBJECTIVE:  Note: Objective measures were completed at Evaluation unless otherwise noted.    COGNITION: Overall cognitive status: intact with improved outlook     SENSATION: WFL  MUSCLE LENGTH: 04/29/24: see palpation for updates 5/30: see palpation for updates 4/4 See palpation for updates Eval: Adaptive shortening of Lt obliques, QL, lat secondary to rigid scoliosis  POSTURE: rounded shoulders, forward head, decreased lumbar lordosis, and increased thoracic kyphosis, severe degenerative scoliosis with Lt SB/Rt Rot, left ribcage in near contact with left ilium  PALPATION: 04/29/24: Pt will improved soft tissue extensibility along Lt side of trunk, improving lateral costal expansion along Lt side of trunk, reducing trigger points in bil gluteals, presence of painful trigger points in Rt upper trap, levator and rhomboids today  5/30: Improved soft tissue extensibility along Lt side of trunk, improving lateral costal expansion along Lt side of trunk, improving gut sounds and motility along Lt side of trunk, reducing trigger points in Lt lat and shoulder  4/4:  Improving mobility of soft tissues along Lt side of trunk allowing reduced pressure on bowels, bladder, stomach to allow improved bowel movements, control of bowel and bladder Improving lateral costal expansion along Lt side of trunk for improved depth of inhale and stretching of intercostals  Eval:  Spasm present in bil upper traps Fascial and muscle mobility restriction along Lt side of trunk due to adaptive shortening from scoliosis: obliques, QL, hip flexors,  lat  LUMBAR ROM:   AROM eval 01/01/24 02/26/24 04/29/24  Flexion full full full full  Extension NT NT NT NT  Right lateral flexion 20% 30% 40% 50%  Left lateral flexion 40% 50% 75% 75%  Right rotation 40% 50% 50% 75%  Left rotation 20% 20% 30% 50%   (Blank rows = not tested)  LOWER EXTREMITY ROM:    WFL, flexion is full with good hamstring length   LOWER EXTREMITY MMT:   04/29/24: hip abd and ER 4+/5, scapular stabilizers 4/5 4/4: hip abd and ER 4/5 Eval: Grossly 4/5 throughout, 4-/5 hip abduction, ER  FUNCTIONAL TESTS:    GAIT: Distance walked: within clinic Assistive device utilized: None Level of assistance: Modified independence Comments: short stride length  TREATMENT DATE:  05/06/24: Seated STM Rt trigger point release to upper trap, levator and rhomboids, Lt lat and posterior deltoid, bil neck STM Rt SL manual therapy - broadening and elongation of Lt obliques, QL, intercostals, paraspinals from cervical to lumbar, gluteals, hip flexors.  Gentle sacral distraction. Rt SL tactile cues with initial pressure and spring off for lateral costal expansion deep slow breathing with overhead reach of Lt UE and long leg reach of Lt LE  04/29/24: Seated STM Rt trigger point release to upper trap, levator and rhomboids, Lt lat and posterior deltoid, bil neck STM Rt SL manual therapy - broadening and elongation of Lt obliques, QL, intercostals, paraspinals from cervical to lumbar, gluteals, hip flexors.  Gentle sacral distraction. Rt SL tactile cues with initial pressure and spring off for lateral costal expansion deep slow breathing with overhead reach of Lt UE and long leg reach of Lt LE Supine core: bicycle, scissors, march, SLR alt LE x10 each Supine HS stretch with ankle pumps bil x30 Standing green band x 15 each: bil ER, horiz abd, shoulder ext  04/11/24: Rt SL manual therapy - broadening and elongation of Lt obliques, QL, intercostals, paraspinals from cervical to lumbar,  gluteals,  hip flexors.  Gentle sacral distraction. Supine core: bicycle, scissors, march alt LE x10 each Supine HS stretch with ankle pumps bil x30 Seated in chair posterior bil upper quadrant stripping, passive pec stretch bil  04/05/24: Rt SL manual therapy - broadening and elongation of Lt obliques, QL, intercostals, paraspinals from cervical to lumbar, gluteals, hip flexors.  Gentle sacral distraction. Supine core: bicycle, scissors, march alt LE x10 each Supine HS stretch with ankle pumps bil x30 DKTC x 30 LTR x 10 Seated in chair posterior bil upper quadrant stripping, passive pec stretch bil                                                                              PATIENT EDUCATION:  Education details: Pt education (see above) Person educated: Patient Education method: Explanation Education comprehension: verbalized understanding  HOME EXERCISE PROGRAM: Journaling to outline some activities to try Pelvic floor - select exercises from past HEP Supine LE stretches from past HEP  Access Code: [email or web address] URL: [email or web address] Date: 12/25/2023 Prepared by: Orvil Kota Ciancio  Exercises - Sidelying Diaphragmatic Breathing  - 1 x daily - 7 x weekly - 1 sets - 10 reps - Seated Hip Abduction with Resistance  - 1 x daily - 7 x weekly - 2 sets - 10 reps - Standing Row with Anchored Resistance  - 1 x daily - 7 x weekly - 2 sets - 10 reps - Standing Shoulder Extension with Resistance  - 1 x daily - 7 x weekly - 2 sets - 10 reps  ASSESSMENT:  CLINICAL IMPRESSION: Pt with some GI upset as a side effect from an antibiotic for a UTI this week.  Pt requested to avoid too much movement as she was worried it would trigger a need for the bathroom so we focused on manual therapy only today.  Pt continues to demonstrate improving soft tissue extensibility along Lt side of trunk.  She had some knots with tenderness present in LT gluteals and Lt upper trap and intrascapular  region today which responded well with work from superficial to deep as tol.  She continues to work on her postural strength and mobility within sessions and at home. Overall, with more consistent PT, she reports she is tolerating all household, yard and errands with less pain most of the time. She gets multi-body system benefits from consistent PT as her severe degenerative scoliosis affects gut motility, cardiovascular function and musculoskeletal function due to a very compressed Lt side of trunk.  She will continue to benefit from PT to manage pain, maintain gains made in PT, and optimize systems affected severe compression from degenerative scoliosis (GI, cardiovascular, musculoskeletal).    OBJECTIVE IMPAIRMENTS: cardiopulmonary status limiting activity, decreased activity tolerance, decreased coordination, decreased endurance, decreased mobility, decreased ROM, decreased strength, hypomobility, increased fascial restrictions, increased muscle spasms, impaired flexibility, impaired tone, improper body mechanics, postural dysfunction, and pain.   ACTIVITY LIMITATIONS: carrying, lifting, bending, sitting, standing, squatting, sleeping, stairs, transfers, bed mobility, continence, bathing, dressing, and locomotion level  PARTICIPATION LIMITATIONS: meal prep, cleaning, laundry, shopping, and community activity  PERSONAL FACTORS: Age, Behavior pattern, Time since onset of injury/illness/exacerbation, and 1-2 comorbidities: pulmonary HTN, a-flutter, osteoporosis, severe degenerative scoliosis are also  affecting patient's functional outcome.   REHAB POTENTIAL: Good  CLINICAL DECISION MAKING: Evolving/moderate complexity  EVALUATION COMPLEXITY: Moderate   GOALS: Goals reviewed with patient? Yes  SHORT TERM GOALS: Target date: 11/27/23  Pt will journal and discover at least 1 activity to return to that she enjoys Baseline: Goal status: met, watching movies with neighbors 3/7  2.  Pt will strive  to eat consistent small meals throughout the day to initiate a bowel routine Baseline:  Goal status: met 4/4, most days  3.  Pt will report 20% less pain around abdomen and trunk to allow for comfort with small meals Baseline:  Goal status: met for most days 4/4    LONG TERM GOALS: Target date: 08/05/24  Pt will return to walking program at least 2x/week for multi-system health benefits. Baseline:  Goal status: will walk one lap around neighborhood 1-2x/day and participate in short errands  2.  Pt will be ind with HEP for gluteal and pelvic floor strength to reduce incidence of bowel leakage. Baseline:  Goal status: ongoing 8/1, notes about 75% less leakage  3.  Pt will establish nighttime routine including [employer] app, medidation, breathing and stretching routine to improve sleep stretches. Baseline:  Goal status: MET 5/30, has established consistent routine  4.  Pt will report at least 50% less pain with daily tasks and eating secondary to reduced soft tissue compression surrounding scoliosis. Baseline:  Goal status: ONGOING 5/30, reports 50% improvement a few days of the week    PLAN:  PT FREQUENCY: 1-2x/week  PT DURATION: 8 weeks  PLANNED INTERVENTIONS: 97110-Therapeutic exercises, 97530- Therapeutic activity, 97112- Neuromuscular re-education, 97535- Self Care, [id number]- Manual therapy, Dry Needling, Joint mobilization, Spinal mobilization, Cryotherapy, and Moist heat.  PLAN FOR NEXT SESSION: postural strength, incorporate PF education and training with therex, ambulation for endurance, manual therapy  Namon Villarin, PT 05/06/24 11:56 AM

## 2024-05-09 DIAGNOSIS — H40013 Open angle with borderline findings, low risk, bilateral: Secondary | ICD-10-CM | POA: Diagnosis not present

## 2024-05-13 ENCOUNTER — Encounter: Payer: Medicare Other | Admitting: Physical Therapy

## 2024-05-16 ENCOUNTER — Ambulatory Visit (HOSPITAL_BASED_OUTPATIENT_CLINIC_OR_DEPARTMENT_OTHER): Admitting: Pulmonary Disease

## 2024-05-16 ENCOUNTER — Encounter (HOSPITAL_BASED_OUTPATIENT_CLINIC_OR_DEPARTMENT_OTHER): Payer: Self-pay | Admitting: Pulmonary Disease

## 2024-05-16 ENCOUNTER — Ambulatory Visit: Payer: Medicare Other | Admitting: Physical Therapy

## 2024-05-16 ENCOUNTER — Encounter: Payer: Self-pay | Admitting: Physical Therapy

## 2024-05-16 ENCOUNTER — Other Ambulatory Visit (HOSPITAL_BASED_OUTPATIENT_CLINIC_OR_DEPARTMENT_OTHER): Payer: Self-pay

## 2024-05-16 VITALS — BP 113/76 | HR 63

## 2024-05-16 DIAGNOSIS — M546 Pain in thoracic spine: Secondary | ICD-10-CM | POA: Diagnosis not present

## 2024-05-16 DIAGNOSIS — R0609 Other forms of dyspnea: Secondary | ICD-10-CM

## 2024-05-16 DIAGNOSIS — M6281 Muscle weakness (generalized): Secondary | ICD-10-CM

## 2024-05-16 DIAGNOSIS — J479 Bronchiectasis, uncomplicated: Secondary | ICD-10-CM

## 2024-05-16 DIAGNOSIS — M4125 Other idiopathic scoliosis, thoracolumbar region: Secondary | ICD-10-CM | POA: Diagnosis not present

## 2024-05-16 DIAGNOSIS — G8929 Other chronic pain: Secondary | ICD-10-CM | POA: Diagnosis not present

## 2024-05-16 DIAGNOSIS — R293 Abnormal posture: Secondary | ICD-10-CM | POA: Diagnosis not present

## 2024-05-16 DIAGNOSIS — M545 Low back pain, unspecified: Secondary | ICD-10-CM

## 2024-05-16 DIAGNOSIS — M62830 Muscle spasm of back: Secondary | ICD-10-CM

## 2024-05-16 DIAGNOSIS — M542 Cervicalgia: Secondary | ICD-10-CM

## 2024-05-16 MED ORDER — ALBUTEROL SULFATE HFA 108 (90 BASE) MCG/ACT IN AERS: 2.0000 | INHALATION_SPRAY | RESPIRATORY_TRACT | 3 refills | Status: DC | PRN

## 2024-05-16 MED ORDER — IPRATROPIUM BROMIDE 0.02 % IN SOLN: 0.5000 mg | Freq: Every day | RESPIRATORY_TRACT | 5 refills | Status: DC

## 2024-05-16 MED ORDER — ALBUTEROL SULFATE HFA 108 (90 BASE) MCG/ACT IN AERS: 2.0000 | INHALATION_SPRAY | RESPIRATORY_TRACT | 3 refills | Status: AC | PRN

## 2024-05-16 NOTE — Progress Notes (Unsigned)
 Subjective:   PATIENT ID: Janet Nguyen GENDER: female DOB: January 16, 1932, MRN: [id number]  Chief Complaint  Patient presents with   Acute Visit    SOB     Reason for Visit: Follow-up   Ms. Janet Nguyen is a 89 year old female with bronchiectasis, scoliosis, hx PE 09/2021, hx OSA not on CPAP who presents with follow-up.  10/02/23 She is a patient of Dr. Geronimo and has intermittently tried Spiriva  but having difficulty handling the inhaler. Review of prior notes demonstrates she has had difficulty with flutter valve in the past as well. She is using albuterol  2-4 times a day and not on Spiriva  due to inability to use. She was also unsure if she could use Spiriva  with her Albuterol . Still has shortness of breath.  12/09/23 Since our last visit she reports her shortness of breath is unchanged. In the past she has been able to participate in yoga since retirement and feels her scoliosis/back pain has worsened. Has been participating in physical therapy on and off for >1 year. Currently enrolled in rehab this month. She reports shortness of breath that she attributes to her heart conditions, nasal congestion. She does report nose bleeds daily and knows the Eliquis  increases her risk for this. She reports she has been using Spiriva  daily but does not feel like it has been effective. Reports shortness of breath and chest tightness. Symptoms can occur at night. Denies wheezing.  05/16/24 She presents with her daughter Leita. Since our last visit she reports breathing is overall worse. She acknowledges she is older but wants to know if there is anything she could do to help herself. Breztri  and Spiriva  in the past have been ineffective. Albuterol  1 puff once a day with some effect. Reports some chest heaviness/tightness.  Environmental exposures: Radon   Past Medical History:  Diagnosis Date   Arrhythmia    History of SVT with documented PVC'S and  PAC'S  12/08/12 Nuc stress test normal LV  EF 74%  Event Monitor  12/01/12-01/03/13   Atrial flutter (HCC)    Celiac disease    treated by Dr. Luis   GERD (gastroesophageal reflux disease)    Hypertension    Intervertebral disc stenosis of neural canal of cervical region    Irregular heart beat 11/30/2012   ECHO-EF 60-65%   Osteoporosis    PMR (polymyalgia rheumatica) (HCC)    Dr. Balinda; pt states she was diagnosed 10-15 years ago, not treated at this time or any issues that she is aware of.   Scoliosis    Scoliosis    Sleep apnea 10/02/11 [facility]   Sleep study AHI -total sleep 10.3/hr  64.0/ hr during REM sleep.RDI 22.8/hr during total sleep 64.0/hr during REM sleep The lowest O2 sat during Non-REM and REM sleep was 86% and 88% respectively. 04/08/12 CPAP/BIPAP titration study [facility]     Family History  Problem Relation Age of Onset   Breast cancer Mother    Heart disease Father    Migraines Neg Hx      Social History   Occupational History   Occupation: retired   Tobacco Use   Smoking status: Never   Smokeless tobacco: Never   Tobacco comments:    Never smoke 12/12/21  Vaping Use   Vaping status: Never Used  Substance and Sexual Activity   Alcohol use: Yes    Alcohol/week: 3.0 - 4.0 standard drinks of alcohol    Types: 3 -  4 Glasses of wine per week    Comment: 1 glass of wine 3-4 times weekly 12/12/21   Drug use: No   Sexual activity: Not on file    Allergies  Allergen Reactions   Gluten Meal Other (See Comments)    Unknown   Naproxen Other (See Comments)    Stomach upset   Amoxicillin  Rash     Outpatient Medications Prior to Visit  Medication Sig Dispense Refill   acetaminophen  (TYLENOL ) 500 MG tablet Take 500 mg by mouth every 8 (eight) hours as needed for moderate pain.     albuterol  (VENTOLIN  HFA) 108 (90 Base) MCG/ACT inhaler Inhale 2 puffs into the lungs every 4 (four) hours as needed.     Aloe-Sodium Chloride  (AYR SALINE NASAL GEL NA) Place 1  application  into the nose daily as needed (rhinitis).     ALPRAZolam  (XANAX ) 0.5 MG tablet Take 0.25-0.5 mg by mouth 2 (two) times daily as needed for anxiety.     apixaban  (ELIQUIS ) 2.5 MG TABS tablet Take 1 tablet (2.5 mg total) by mouth 2 (two) times daily. 28 tablet 0   apixaban  (ELIQUIS ) 2.5 MG TABS tablet Take 1 tablet (2.5 mg total) by mouth 2 (two) times daily. 60 tablet 6   apixaban  (ELIQUIS ) 2.5 MG TABS tablet Take 1 tablet (2.5 mg total) by mouth 2 (two) times daily. 28 tablet 0   Biotin w/ Vitamins C & E (HAIR SKIN & NAILS GUMMIES PO) Take 1 tablet by mouth daily.     Calcium Carb-Ergocalciferol (CHEWABLE CALCIUM/D PO) Take 1 each by mouth daily.     clidinium-chlordiazePOXIDE  (LIBRAX) 5-2.5 MG capsule Take 1 capsule by mouth daily as needed. 60 capsule 3   digoxin  (LANOXIN ) 0.125 MG tablet TAKE 1/2 TABLET BY MOUTH DAILY 45 tablet 2   diltiazem  (CARDIZEM  CD) 120 MG 24 hr capsule TAKE 1 CAPSULE BY MOUTH DAILY 90 capsule 3   Doxylamine-Phenylephrine -APAP (SINUS & CONGESTION DAY/NIGHT) MISC 1 tablet Orally once a day As needed     fluticasone (FLONASE) 50 MCG/ACT nasal spray Place 1 spray into both nostrils daily as needed for allergies or rhinitis.     latanoprost (XALATAN) 0.005 % ophthalmic solution As directed     metoprolol  succinate (TOPROL -XL) 50 MG 24 hr tablet Take 2 tablets (100 mg) in the morning.  Take 2 (100mg ) tablets in the evening. 315 tablet 3   mirtazapine (REMERON) 15 MG tablet Take 15 mg by mouth at bedtime.     nitrofurantoin, macrocrystal-monohydrate, (MACROBID) 100 MG capsule Take 100 mg by mouth 2 (two) times daily.     Phenylephrine -APAP-guaiFENesin (EQ SINUS CONGESTION & PAIN PO) Take 1 tablet by mouth daily as needed (for sinus pain). Contains acetaminophen  325 mg and Phenylephrine  5 mg     polyethylene glycol powder (GLYCOLAX /MIRALAX ) 17 GM/SCOOP powder Take 17 g by mouth daily as needed for moderate constipation.     potassium chloride  (KLOR-CON  M) 10 MEQ  tablet Take 1 tablet (10 mEq total) by mouth daily. 90 tablet 3   torsemide  (DEMADEX ) 20 MG tablet Take 1 Tablet by mouth daily in the Morning and 1 Tablet by mouth daily in the evening 180 tablet 3   zolpidem  (AMBIEN ) 5 MG tablet Take 2.5 mg by mouth at bedtime.     Tiotropium Bromide  Monohydrate (SPIRIVA  RESPIMAT) 1.25 MCG/ACT AERS Inhale 2 puffs into the lungs daily. 4 g 5   budeson-glycopyrrolate-formoterol (BREZTRI  AEROSPHERE) 160-9-4.8 MCG/ACT AERO Inhale 2 puffs into the lungs in the morning  and at bedtime. (Patient not taking: Reported on 02/08/2024)     No facility-administered medications prior to visit.    Review of Systems  Constitutional:  Negative for chills, diaphoresis, fever, malaise/fatigue and weight loss.  HENT:  Negative for congestion.   Respiratory:  Positive for shortness of breath. Negative for cough, hemoptysis, sputum production and wheezing.   Cardiovascular:  Negative for chest pain, palpitations and leg swelling.     Objective:   Vitals:   05/16/24 1506  BP: 113/76  Pulse: 63  SpO2: 95%   SpO2: 95 %  Physical Exam: General: Elderly-appearing, no acute distress HENT: [place], AT Eyes: EOMI, no scleral icterus Respiratory: Clear to auscultation bilaterally.  No crackles, wheezing or rales Cardiovascular: RRR, -M/R/G, no JVD Extremities:-Edema,-tenderness Neuro: AAO x4, CNII-XII grossly intact Psych: Normal mood, normal affect   Data Reviewed:  Imaging: CXR 03/02/23 - Bilateral pleural effusion CT Chest 02/20/23 - Loculated right pleural effusion and left pleural effusion. Chronic atelectasis in RML and lingula     Assessment & Plan:   Discussion: 89 year old female with bronchiectasis, scoliosis, hx PE 09/2021, hx OSA not on CPAP who presents with follow-up. Currently on albuterol  PRN.  Shortness of breath - likely multifactorial including bronchiectasis, loculated pleural effusion, deconditioning in setting of scoliosis, chronic cardiac  issues --ORDER nebulizer to use with ATROVENT  (ipratropium) once a day --CONTINUE Albuterol  every 4 hours AS NEEDED for rescue. Try to minimize as this can worsen your atrial fibrillation  Health Maintenance Immunization History  Administered Date(s) Administered   Fluad Quad(high Dose 65+) 07/11/2022   Fluzone  Influenza virus vaccine,trivalent (IIV3), split virus 06/26/2014, 06/29/2018   Influenza, High Dose Seasonal PF 06/15/2015, 06/19/2016, 07/24/2017, 05/24/2019, 06/20/2021   Influenza,inj,quad, With Preservative 06/29/2018   Influenza-Unspecified 06/30/2013   PFIZER Comirnaty(Gray Top)Covid-19 Tri-Sucrose Vaccine 02/21/2021   PFIZER(Purple Top)SARS-COV-2 Vaccination 10/14/2019, 11/04/2019, 07/18/2020, 07/01/2021   Pneumococcal Conjugate-13 03/09/2014   Tdap 03/21/2009, 05/29/2021    No orders of the defined types were placed in this encounter.  Meds ordered this encounter  Medications   ipratropium (ATROVENT ) 0.02 % nebulizer solution    Sig: Take 2.5 mLs (0.5 mg total) by nebulization daily.    Dispense:  150 mL    Refill:  5    Return in about 4 months (around 09/15/2024).  I have spent a total time of***-minutes on the day of the appointment including chart review, data review, collecting history, coordinating care and discussing medical diagnosis and plan with the patient/family. Past medical history, allergies, medications were reviewed. Pertinent imaging, labs and tests included in this note have been reviewed and interpreted independently by me.  Kaleesi Guyton Slater Staff, MD [facility] 05/16/2024 3:39 PM

## 2024-05-16 NOTE — Therapy (Signed)
 OUTPATIENT PHYSICAL THERAPY THORACOLUMBAR TREATMENT    Patient Name: Janet Nguyen MRN: [id number] DOB:August 25, 1932, 89 y.o., female Today's Date: 05/16/2024  END OF SESSION:  PT End of Session - 05/16/24 1115     Visit Number 20    Date for PT Re-Evaluation 08/05/24    Authorization Type Medicare part A/B - KX at visit 15    Progress Note Due on Visit 27    PT Start Time 1115   PT was running late   PT Stop Time 1200    PT Time Calculation (min) 45 min    Activity Tolerance Patient tolerated treatment well    Behavior During Therapy [facility] for tasks assessed/performed                           Past Medical History:  Diagnosis Date   Arrhythmia    History of SVT with documented PVC'S and  PAC'S  12/08/12 Nuc stress test normal LV EF 74%  Event Monitor  12/01/12-01/03/13   Atrial flutter (HCC)    Celiac disease    treated by Dr. Luis   GERD (gastroesophageal reflux disease)    Hypertension    Intervertebral disc stenosis of neural canal of cervical region    Irregular heart beat 11/30/2012   ECHO-EF 60-65%   Osteoporosis    PMR (polymyalgia rheumatica) (HCC)    Dr. Balinda; pt states she was diagnosed 10-15 years ago, not treated at this time or any issues that she is aware of.   Scoliosis    Scoliosis    Sleep apnea 10/02/11 [facility]   Sleep study AHI -total sleep 10.3/hr  64.0/ hr during REM sleep.RDI 22.8/hr during total sleep 64.0/hr during REM sleep The lowest O2 sat during Non-REM and REM sleep was 86% and 88% respectively. 04/08/12 CPAP/BIPAP titration study [facility]   Past Surgical History:  Procedure Laterality Date   ANAL RECTAL MANOMETRY N/A 09/16/2023   Procedure: ANO RECTAL MANOMETRY;  Surgeon: Burnette Fallow, MD;  Location: [facility] END[facility]COPY;  Service: Gastroenterology;  Laterality: N/A;   APPENDECTOMY     ruptured at age 75 and had surgery   CARDIAC CATHETERIZATION  01/27/06   CARDIOVERSION N/A  01/08/2022   Procedure: CARDIOVERSION;  Surgeon: Hobart Powell BRAVO, MD;  Location: [facility];  Service: Cardiovascular;  Laterality: N/A;   cataract surgery  2015   Dr. Cleatus; March & April 2015   Patient Active Problem List   Diagnosis Date Noted   Dyspnea 05/30/2023   Pleural effusion 05/30/2023   Radon exposure 05/30/2023   Secondary hypercoagulable state (HCC) 12/12/2021   Hyponatremia 12/04/2021   Acute CHF (congestive heart failure) (HCC) 12/02/2021   Chronic diastolic heart failure (HCC) 12/02/2021   Atrial fibrillation with rapid ventricular response (HCC) 12/02/2021   Essential hypertension 12/02/2021   GERD without esophagitis 12/02/2021   Obstructive sleep apnea 12/02/2021   Interstitial lung disease (HCC) 12/02/2021   History of pulmonary embolism 12/02/2021   Pulmonary embolism (HCC) 11/06/2021   Bronchiectasis (HCC) 11/06/2021   Deviated septum 05/08/2020   Mass of subcutaneous tissue 05/02/2020   Vertigo 06/16/2019   Pulmonary hypertension, unspecified (HCC) 04/14/2019   Ischemic colitis (HCC) 04/14/2019   Migraine with aura and without status migrainosus, not intractable 11/08/2018   Degeneration of lumbar intervertebral disc 10/14/2018   Hoarseness of voice 03/04/2018   Abdominal pain 07/01/2017   Diverticulitis, colon    Metabolic acidosis, increased anion  gap    Constipation 04/14/2017   Lumbar hernia 04/14/2017   Presbycusis of both ears 01/10/2017   Tinnitus aurium, bilateral 01/10/2017   Gastroesophageal reflux disease 08/20/2016   Hemoptysis 08/20/2016   Obstructive sleep apnea of adult 08/20/2016   Rhinitis, chronic 08/20/2016   Throat pain in adult 08/20/2016   Fatigue 12/23/2015   Sciatica of right side 10/06/2015   History of migraine headaches 10/06/2015   Frequent PVCs 12/28/2013   Premature atrial contractions 12/28/2013   PSVT (paroxysmal supraventricular tachycardia) (HCC) 12/28/2013   Heart palpitations 07/13/2013   Sleep apnea  04/11/2013   Scoliosis 04/11/2013   Atypical atrial flutter (HCC) 11/29/2012   Chest pain, atypical 11/29/2012   Fibromyalgia syndrome 11/29/2012   Chronic steroid use 11/29/2012    PCP: Okey Carlin Redbird, MD  REFERRING PROVIDER: Okey Carlin Redbird, MD  REFERRING DIAG: M41.9 (ICD-10-CM) - Scoliosis  Rationale for Evaluation and Treatment: Rehabilitation  THERAPY DIAG:  Abnormal posture  Cervicalgia  Other idiopathic scoliosis, thoracolumbar region  Pain in thoracic spine  Chronic bilateral low back pain without sciatica  Muscle spasm of back  Muscle weakness (generalized)  ONSET DATE: chronic, years of progressive pain from degenerative scoliosis  SUBJECTIVE:                                                                                                                                                                                           SUBJECTIVE STATEMENT: I am miserable.  There is a water leak in my house and I haven't been able to rest with all of the people coming in to assess damages.  They are going to have to rip out cabinets and carpet.  I am so upset and so tired.  My pain is bad b/c of no rest.  PERTINENT HISTORY:  Degenerative scoliosis, complex medical history including pulmonary HTN, frequent PVCs and a-flutter, history of cardioversion, osteoporosis Has been SOB with activity On waitlist to get into cardiologist sooner   PAIN:  PAIN:  Are you having pain? Yes NPRS scale: 8/10 Pain location: abdomen, left side of trunk, low back, intermittent bil hips/buttock/knee, Rt foot pains, neck Pain orientation: Other: see above  PAIN TYPE: aching, sharp, and tight Pain description: intermittent and constant  Aggravating factors: varies Relieving factors: unsure   PRECAUTIONS: Other: osteoporosis  RED FLAGS: None   WEIGHT BEARING RESTRICTIONS: No  FALLS:  Has patient fallen in last 6 months? No  LIVING ENVIRONMENT: Lives with: lives with  their family and lives alone Lives in: House/apartment Stairs: No Has following equipment at home: has cane and walker but does not use  OCCUPATION: retired  PLOF: Independent with basic ADLs, Independent with household mobility without device, Independent with community mobility without device, and Needs assistance with homemaking  PATIENT GOALS:  be able to eat, improve strength for bowel control, feel less pain and more energy to expand social outlets and walking  NEXT MD VISIT: as needed  OBJECTIVE:  Note: Objective measures were completed at Evaluation unless otherwise noted.    COGNITION: Overall cognitive status: intact with improved outlook     SENSATION: WFL  MUSCLE LENGTH: 04/29/24: see palpation for updates 5/30: see palpation for updates 4/4 See palpation for updates Eval: Adaptive shortening of Lt obliques, QL, lat secondary to rigid scoliosis  POSTURE: rounded shoulders, forward head, decreased lumbar lordosis, and increased thoracic kyphosis, severe degenerative scoliosis with Lt SB/Rt Rot, left ribcage in near contact with left ilium  PALPATION: 04/29/24: Pt will improved soft tissue extensibility along Lt side of trunk, improving lateral costal expansion along Lt side of trunk, reducing trigger points in bil gluteals, presence of painful trigger points in Rt upper trap, levator and rhomboids today  5/30: Improved soft tissue extensibility along Lt side of trunk, improving lateral costal expansion along Lt side of trunk, improving gut sounds and motility along Lt side of trunk, reducing trigger points in Lt lat and shoulder  4/4:  Improving mobility of soft tissues along Lt side of trunk allowing reduced pressure on bowels, bladder, stomach to allow improved bowel movements, control of bowel and bladder Improving lateral costal expansion along Lt side of trunk for improved depth of inhale and stretching of intercostals  Eval:  Spasm present in bil upper  traps Fascial and muscle mobility restriction along Lt side of trunk due to adaptive shortening from scoliosis: obliques, QL, hip flexors, lat  LUMBAR ROM:   AROM eval 01/01/24 02/26/24 04/29/24  Flexion full full full full  Extension NT NT NT NT  Right lateral flexion 20% 30% 40% 50%  Left lateral flexion 40% 50% 75% 75%  Right rotation 40% 50% 50% 75%  Left rotation 20% 20% 30% 50%   (Blank rows = not tested)  LOWER EXTREMITY ROM:    WFL, flexion is full with good hamstring length   LOWER EXTREMITY MMT:   04/29/24: hip abd and ER 4+/5, scapular stabilizers 4/5 4/4: hip abd and ER 4/5 Eval: Grossly 4/5 throughout, 4-/5 hip abduction, ER  FUNCTIONAL TESTS:    GAIT: Distance walked: within clinic Assistive device utilized: None Level of assistance: Modified independence Comments: short stride length  TREATMENT DATE:  05/16/24: Rt SL manual therapy - broadening and elongation of Lt obliques, lat, QL, intercostals, paraspinals from cervical to lumbar, gluteals, hip flexors.  Gentle sacral distraction. Seated STM Rt trigger point release to upper trap, levator and rhomboids, Lt lat and posterior deltoid, bil neck STM  05/06/24: Seated STM Rt trigger point release to upper trap, levator and rhomboids, Lt lat and posterior deltoid, bil neck STM Rt SL manual therapy - broadening and elongation of Lt obliques, QL, intercostals, paraspinals from cervical to lumbar, gluteals, hip flexors.  Gentle sacral distraction. Rt SL tactile cues with initial pressure and spring off for lateral costal expansion deep slow breathing with overhead reach of Lt UE and long leg reach of Lt LE  04/29/24: Seated STM Rt trigger point release to upper trap, levator and rhomboids, Lt lat and posterior deltoid, bil neck STM Rt SL manual therapy - broadening and elongation of Lt obliques, QL, intercostals, paraspinals from cervical to lumbar, gluteals, hip flexors.  Gentle sacral distraction. Rt SL tactile cues with  initial pressure and spring off for lateral costal expansion deep slow breathing with overhead reach of Lt UE and long leg reach of Lt LE Supine core: bicycle, scissors, march, SLR alt LE x10 each Supine HS stretch with ankle pumps bil x30 Standing green band x 15 each: bil ER, horiz abd, shoulder ext                                                                               PATIENT EDUCATION:  Education details: Pt education (see above) Person educated: Patient Education method: Explanation Education comprehension: verbalized understanding  HOME EXERCISE PROGRAM: Journaling to outline some activities to try Pelvic floor - select exercises from past HEP Supine LE stretches from past HEP  Access Code: [email or web address] URL: [email or web address] Date: 12/25/2023 Prepared by: Orvil Ryland T[place]  Exercises - Sidelying Diaphragmatic Breathing  - 1 x daily - 7 x weekly - 1 sets - 10 reps - Seated Hip Abduction with Resistance  - 1 x daily - 7 x weekly - 2 sets - 10 reps - Standing Row with Anchored Resistance  - 1 x daily - 7 x weekly - 2 sets - 10 reps - Standing Shoulder Extension with Resistance  - 1 x daily - 7 x weekly - 2 sets - 10 reps  ASSESSMENT:  CLINICAL IMPRESSION: Pt was visibly exhausted today due to lack of rest with significant household disruption due to a leak.  She was particularly tight today in her Lt QL, lat and upper trap, likely from a change in schedule not allowing her to perform her HEP and from stress.  Pt continues to benefit from consistent PT to address adaptive shortening of trunk muscles along Lt side from severe degenerative scoliosis.  Pt's condition affects multiple systems including GI, cardiopulmonary and musculoskeletal.  She feels with PT she has been better able to regulate her eating, digestion, breathing and activity level while maintaining a mostly ind lifestyle while living alone.  OBJECTIVE IMPAIRMENTS: cardiopulmonary status limiting  activity, decreased activity tolerance, decreased coordination, decreased endurance, decreased mobility, decreased ROM, decreased strength, hypomobility, increased fascial restrictions, increased muscle spasms, impaired flexibility, impaired tone, improper body mechanics, postural dysfunction, and pain.   ACTIVITY LIMITATIONS: carrying, lifting, bending, sitting, standing, squatting, sleeping, stairs, transfers, bed mobility, continence, bathing, dressing, and locomotion level  PARTICIPATION LIMITATIONS: meal prep, cleaning, laundry, shopping, and community activity  PERSONAL FACTORS: Age, Behavior pattern, Time since onset of injury/illness/exacerbation, and 1-2 comorbidities: pulmonary HTN, a-flutter, osteoporosis, severe degenerative scoliosis are also affecting patient's functional outcome.   REHAB POTENTIAL: Good  CLINICAL DECISION MAKING: Evolving/moderate complexity  EVALUATION COMPLEXITY: Moderate   GOALS: Goals reviewed with patient? Yes  SHORT TERM GOALS: Target date: 11/27/23  Pt will journal and discover at least 1 activity to return to that she enjoys Baseline: Goal status: met, watching movies with neighbors 3/7  2.  Pt will strive to eat consistent small meals throughout the day to initiate a bowel routine Baseline:  Goal status: met 4/4, most days  3.  Pt will report 20% less pain around abdomen and trunk to allow for comfort with small meals Baseline:  Goal status:  met for most days 4/4    LONG TERM GOALS: Target date: 08/05/24  Pt will return to walking program at least 2x/week for multi-system health benefits. Baseline:  Goal status: will walk one lap around neighborhood 1-2x/day and participate in short errands  2.  Pt will be ind with HEP for gluteal and pelvic floor strength to reduce incidence of bowel leakage. Baseline:  Goal status: ongoing 8/1, notes about 75% less leakage  3.  Pt will establish nighttime routine including Cur[employer] app, medidation,  breathing and stretching routine to improve sleep stretches. Baseline:  Goal status: MET 5/30, has established consistent routine  4.  Pt will report at least 50% less pain with daily tasks and eating secondary to reduced soft tissue compression surrounding scoliosis. Baseline:  Goal status: ONGOING 5/30, reports 50% improvement a few days of the week    PLAN:  PT FREQUENCY: 1-2x/week  PT DURATION: 8 weeks  PLANNED INTERVENTIONS: 97110-Therapeutic exercises, 97530- Therapeutic activity, 97112- Neuromuscular re-education, 97535- Self Care, [id number]- Manual therapy, Dry Needling, Joint mobilization, Spinal mobilization, Cryotherapy, and Moist heat.  PLAN FOR NEXT SESSION: postural strength, incorporate PF education and training with therex, ambulation for endurance, manual therapy  Roemello Speyer, PT 05/16/24 12:49 PM

## 2024-05-16 NOTE — Patient Instructions (Signed)
 Shortness of breath - likely multifactorial including bronchiectasis, loculated pleural effusion, deconditioning in setting of scoliosis, chronic cardiac issues --ORDER nebulizer to use with ATROVENT  (ipratropium) once a day --CONTINUE Albuterol  every 4 hours AS NEEDED for rescue. Try to minimize as this can worsen your atrial fibrillation

## 2024-05-19 ENCOUNTER — Encounter (HOSPITAL_BASED_OUTPATIENT_CLINIC_OR_DEPARTMENT_OTHER): Payer: Self-pay | Admitting: Pulmonary Disease

## 2024-05-24 ENCOUNTER — Ambulatory Visit (HOSPITAL_BASED_OUTPATIENT_CLINIC_OR_DEPARTMENT_OTHER): Admitting: Cardiology

## 2024-05-24 ENCOUNTER — Encounter (HOSPITAL_BASED_OUTPATIENT_CLINIC_OR_DEPARTMENT_OTHER): Payer: Self-pay | Admitting: Cardiology

## 2024-05-24 VITALS — BP 110/80 | HR 70 | Wt 113.2 lb

## 2024-05-24 DIAGNOSIS — D6869 Other thrombophilia: Secondary | ICD-10-CM

## 2024-05-24 DIAGNOSIS — I340 Nonrheumatic mitral (valve) insufficiency: Secondary | ICD-10-CM | POA: Diagnosis not present

## 2024-05-24 DIAGNOSIS — N9489 Other specified conditions associated with female genital organs and menstrual cycle: Secondary | ICD-10-CM | POA: Diagnosis not present

## 2024-05-24 DIAGNOSIS — Z7901 Long term (current) use of anticoagulants: Secondary | ICD-10-CM

## 2024-05-24 DIAGNOSIS — I5032 Chronic diastolic (congestive) heart failure: Secondary | ICD-10-CM

## 2024-05-24 DIAGNOSIS — I4821 Permanent atrial fibrillation: Secondary | ICD-10-CM

## 2024-05-24 DIAGNOSIS — R300 Dysuria: Secondary | ICD-10-CM | POA: Diagnosis not present

## 2024-05-24 NOTE — Patient Instructions (Signed)
Medication Instructions:  Your physician recommends that you continue on your current medications as directed. Please refer to the Current Medication list given to you today.   *If you need a refill on your cardiac medications before your next appointment, please call your pharmacy*  Lab Work: NONE  Testing/Procedures: NONE   Follow-Up: At [facility], you and your health needs are our priority.  As part of our continuing mission to provide you with exceptional heart care, we have created designated Provider Care Teams.  These Care Teams include your primary Cardiologist (physician) and Advanced Practice Providers (APPs -  Physician Assistants and Nurse Practitioners) who all work together to provide you with the care you need, when you need it.  We recommend signing up for the patient portal called "MyChart".  Sign up information is provided on this After Visit Summary.  MyChart is used to connect with patients for Virtual Visits (Telemedicine).  Patients are able to view lab/test results, encounter notes, upcoming appointments, etc.  Non-urgent messages can be sent to your provider as well.   To learn more about what you can do with MyChart, go to [email or web address].    Your next appointment:   6 month(s)  The format for your next appointment:   In Person  Provider:   Bridgette Christopher, MD             

## 2024-05-24 NOTE — Progress Notes (Signed)
Cardiology Office Note:  .   Date:  05/24/2024  ID:  Janet Nguyen, Janet Nguyen 1931/12/28, MRN [id number] PCP: Okey Carlin Redbird, MD  [facility] Providers Cardiologist:  Shelda Bruckner, MD Cardiology APP:  Madie Jon Garre, [place] {  History of Present Illness: .   Janet Nguyen is a 89 y.o. female with PMH SVT, PACs, PVCs, atrial fibrillation, PE 2023. She was previously followed by Dr. Burnard and established with me on 02/08/24.  Pertinent CV history: afib diagnosed 2023, Not a candidate for amiodarone given underlying lung disease. Was planned for tikosyn but cancelled admission. Currently rate controlled.   Today: Feels that the last few months have been terrible since she turned 89. Difficulty walking due to scoliosis, used to be very active. Feels that her scoliosis impedes her heart and lungs. Feels that she can't breath well, limits her appetite/ability to eat, has constant back and lower chest/abdomen discomfort. Saw Dr. Kassie recently, ordered nebulizer but patient hasn't received yet. Shortness of breath felt to be multifactorial. Hasn't noticed swelling/fluid.    Apixaban  is expensive, has been trying to get samples but she has been out since Friday. Has prescription waiting for her. Discussed alternative is coumadin. We discussed importance of stroke risk and management. She is most concerned about cost and bruising/bleeding. Reviewed data for bleeding and stroke for DOACs and coumadin. Offered to see if she qualifies for patient assistance. Declines change to coumadin.   Chronic LE edema is unchanged, wears compression stockings.   ROS: Denies PND, orthopnea, change in LE edema or unexpected weight gain. No syncope or palpitations. ROS otherwise negative except as noted.   Studies Reviewed: SABRA    EKG:       Physical Exam:   VS:  BP 110/80   Pulse 70   Wt 113 lb 3.2 oz (51.3 kg)   SpO2 98%   BMI 22.11 kg/m    Wt Readings from Last 3 Encounters:   05/24/24 113 lb 3.2 oz (51.3 kg)  03/29/24 114 lb 12.8 oz (52.1 kg)  02/08/24 106 lb 3.2 oz (48.2 kg)    GEN: Well nourished, well developed in no acute distress HEENT: Normal, moist mucous membranes NECK: JVD at clavicle CARDIAC: irregularly irregular rhythm, normal S1 and S2, no rubs or gallops. 2/6 holosystolic murmur. VASCULAR: Radial and DP pulses 2+ bilaterally. No carotid bruits RESPIRATORY:  bronchiectasis but no wheezing or rales ABDOMEN: Soft, non-tender, non-distended MUSCULOSKELETAL:  Ambulates independently SKIN: Warm and dry, trivial bilateral LE edema, R foot >L  NEUROLOGIC:  Alert and oriented x 3. No focal neuro deficits noted. PSYCHIATRIC:  Normal affect    ASSESSMENT AND PLAN: .    Atrial fibrillation, permanent PVCs, history of PACs and pSVT -CHA2DS2/VAS Stroke Risk Points=7 -now treated as permanent, rate controlled -continue digoxin , diltiazem , and metoprolol  as currently ordered -continue apixaban  2.5 mg daily, dose reduced given age and weight. This is costly for her, discussed alternatives, she declines -she also has a history of PE as indication for anticoagulation  Chronic shortness of breath Bronchiectasis, scoliosis Moderate MR Elevated PA pressures Chronic diastolic heart failure -following with Dr. Kassie -continue torsemide  20 mg in the morning and 20 mg in the afternoon.   Dispo: 6 mos with me or APP  Signed, Shelda Bruckner, MD   Shelda Bruckner, MD, PhD, [facility] [place]  [facility]  [place]  [facility] at [facility] [address], [address] [place], [place] [address] [phone]  [phone]   

## 2024-05-25 ENCOUNTER — Telehealth: Payer: Self-pay | Admitting: Family Medicine

## 2024-05-25 NOTE — Telephone Encounter (Signed)
 LVM informing pt we got a referral from Darice Bitter, NP to establish care here at [facility]. Informed pt in message to call back to schedule new patient appointment if desired.

## 2024-05-26 ENCOUNTER — Other Ambulatory Visit (HOSPITAL_BASED_OUTPATIENT_CLINIC_OR_DEPARTMENT_OTHER): Payer: Self-pay | Admitting: Cardiology

## 2024-05-26 NOTE — Telephone Encounter (Signed)
 Prescription refill request for Eliquis  received. Indication: Pe, afib  Last office visit: Lonni, 05/24/2024 Scr: 1.21, 09/09/2023 Age: 89 yo  Weight: 51.1 kg   Refill sent.

## 2024-05-27 ENCOUNTER — Encounter: Payer: Self-pay | Admitting: Physical Therapy

## 2024-05-27 ENCOUNTER — Ambulatory Visit: Payer: Medicare Other | Admitting: Physical Therapy

## 2024-05-27 DIAGNOSIS — M546 Pain in thoracic spine: Secondary | ICD-10-CM | POA: Diagnosis not present

## 2024-05-27 DIAGNOSIS — R293 Abnormal posture: Secondary | ICD-10-CM

## 2024-05-27 DIAGNOSIS — M4125 Other idiopathic scoliosis, thoracolumbar region: Secondary | ICD-10-CM

## 2024-05-27 DIAGNOSIS — M542 Cervicalgia: Secondary | ICD-10-CM

## 2024-05-27 DIAGNOSIS — M545 Low back pain, unspecified: Secondary | ICD-10-CM | POA: Diagnosis not present

## 2024-05-27 DIAGNOSIS — M6281 Muscle weakness (generalized): Secondary | ICD-10-CM

## 2024-05-27 DIAGNOSIS — M62830 Muscle spasm of back: Secondary | ICD-10-CM

## 2024-05-27 DIAGNOSIS — G8929 Other chronic pain: Secondary | ICD-10-CM | POA: Diagnosis not present
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
## 2024-06-08 ENCOUNTER — Telehealth (HOSPITAL_BASED_OUTPATIENT_CLINIC_OR_DEPARTMENT_OTHER): Payer: Self-pay

## 2024-06-08 DIAGNOSIS — J849 Interstitial pulmonary disease, unspecified: Secondary | ICD-10-CM | POA: Diagnosis not present

## 2024-06-08 DIAGNOSIS — I4891 Unspecified atrial fibrillation: Secondary | ICD-10-CM | POA: Diagnosis not present

## 2024-06-08 DIAGNOSIS — K589 Irritable bowel syndrome without diarrhea: Secondary | ICD-10-CM | POA: Diagnosis not present

## 2024-06-08 DIAGNOSIS — Z23 Encounter for immunization: Secondary | ICD-10-CM | POA: Diagnosis not present

## 2024-06-08 DIAGNOSIS — F419 Anxiety disorder, unspecified: Secondary | ICD-10-CM | POA: Diagnosis not present

## 2024-06-08 DIAGNOSIS — Z6820 Body mass index (BMI) 20.0-20.9, adult: Secondary | ICD-10-CM | POA: Diagnosis not present

## 2024-06-08 DIAGNOSIS — G479 Sleep disorder, unspecified: Secondary | ICD-10-CM | POA: Diagnosis not present
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
## 2024-06-09 ENCOUNTER — Telehealth (HOSPITAL_BASED_OUTPATIENT_CLINIC_OR_DEPARTMENT_OTHER): Payer: Self-pay | Admitting: Pulmonary Disease

## 2024-06-09 ENCOUNTER — Ambulatory Visit (HOSPITAL_BASED_OUTPATIENT_CLINIC_OR_DEPARTMENT_OTHER): Admitting: Pulmonary Disease

## 2024-06-09 NOTE — Telephone Encounter (Signed)
 Patient presents in office. She is confused about her nebulizer medication. Warren came out to discuss with patient. Advised her to call the company but patient verbally refused as she is very stressed. Nothing further is needed.

## 2024-06-10 ENCOUNTER — Ambulatory Visit: Attending: Family Medicine | Admitting: Physical Therapy

## 2024-06-10 ENCOUNTER — Encounter: Payer: Self-pay | Admitting: Physical Therapy

## 2024-06-10 DIAGNOSIS — R279 Unspecified lack of coordination: Secondary | ICD-10-CM | POA: Diagnosis present

## 2024-06-10 DIAGNOSIS — M546 Pain in thoracic spine: Secondary | ICD-10-CM | POA: Diagnosis present

## 2024-06-10 DIAGNOSIS — M542 Cervicalgia: Secondary | ICD-10-CM | POA: Diagnosis present

## 2024-06-10 DIAGNOSIS — M6281 Muscle weakness (generalized): Secondary | ICD-10-CM | POA: Insufficient documentation

## 2024-06-10 DIAGNOSIS — G8929 Other chronic pain: Secondary | ICD-10-CM | POA: Insufficient documentation

## 2024-06-10 DIAGNOSIS — M545 Low back pain, unspecified: Secondary | ICD-10-CM | POA: Diagnosis present

## 2024-06-10 DIAGNOSIS — M4125 Other idiopathic scoliosis, thoracolumbar region: Secondary | ICD-10-CM | POA: Diagnosis present

## 2024-06-10 DIAGNOSIS — R293 Abnormal posture: Secondary | ICD-10-CM | POA: Diagnosis present

## 2024-06-10 DIAGNOSIS — M62830 Muscle spasm of back: Secondary | ICD-10-CM | POA: Diagnosis not present
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
## 2024-06-17 ENCOUNTER — Ambulatory Visit: Admitting: Physical Therapy

## 2024-06-17 ENCOUNTER — Encounter: Payer: Self-pay | Admitting: Physical Therapy

## 2024-06-17 DIAGNOSIS — M545 Low back pain, unspecified: Secondary | ICD-10-CM | POA: Diagnosis not present

## 2024-06-17 DIAGNOSIS — M542 Cervicalgia: Secondary | ICD-10-CM | POA: Diagnosis not present

## 2024-06-17 DIAGNOSIS — M6281 Muscle weakness (generalized): Secondary | ICD-10-CM

## 2024-06-17 DIAGNOSIS — M546 Pain in thoracic spine: Secondary | ICD-10-CM

## 2024-06-17 DIAGNOSIS — G8929 Other chronic pain: Secondary | ICD-10-CM | POA: Diagnosis not present

## 2024-06-17 DIAGNOSIS — R293 Abnormal posture: Secondary | ICD-10-CM | POA: Diagnosis not present

## 2024-06-17 DIAGNOSIS — M4125 Other idiopathic scoliosis, thoracolumbar region: Secondary | ICD-10-CM

## 2024-06-17 DIAGNOSIS — M62830 Muscle spasm of back: Secondary | ICD-10-CM

## 2024-06-17 NOTE — Therapy (Signed)
 OUTPATIENT PHYSICAL THERAPY THORACOLUMBAR TREATMENT    Patient Name: Janet Nguyen MRN: [id number] DOB:[birth year], 89 y.o., female Today's Date: 06/17/2024  END OF SESSION:  PT End of Session - 06/17/24 1155     Visit Number 23    Date for Recertification  08/05/24    Authorization Type Medicare part A/B - KX at visit 15    Progress Note Due on Visit 27    PT Start Time 1104    PT Stop Time 1149    PT Time Calculation (min) 45 min    Activity Tolerance Patient tolerated treatment well    Behavior During Therapy [facility] for tasks assessed/performed           Past Medical History:  Diagnosis Date   Arrhythmia    History of SVT with documented PVC'S and  PAC'S  12/08/12 Nuc stress test normal LV EF 74%  Event Monitor  12/01/12-01/03/13   Atrial flutter (HCC)    Celiac disease    treated by Dr. Luis   GERD (gastroesophageal reflux disease)    Hypertension    Intervertebral disc stenosis of neural canal of cervical region    Irregular heart beat 11/30/2012   ECHO-EF 60-65%   Osteoporosis    PMR (polymyalgia rheumatica) (HCC)    Dr. Balinda; pt states she was diagnosed 10-15 years ago, not treated at this time or any issues that she is aware of.   Scoliosis    Scoliosis    Sleep apnea 10/02/11 [facility]   Sleep study AHI -total sleep 10.3/hr  64.0/ hr during REM sleep.RDI 22.8/hr during total sleep 64.0/hr during REM sleep The lowest O2 sat during Non-REM and REM sleep was 86% and 88% respectively. 04/08/12 CPAP/BIPAP titration study [facility]   Past Surgical History:  Procedure Laterality Date   ANAL RECTAL MANOMETRY N/A 09/16/2023   Procedure: ANO RECTAL MANOMETRY;  Surgeon: Burnette Fallow, MD;  Location: [facility];  Service: Gastroenterology;  Laterality: N/A;   APPENDECTOMY     ruptured at age 33 and had surgery   CARDIAC CATHETERIZATION  01/27/06   CARDIOVERSION N/A 01/08/2022   Procedure: CARDIOVERSION;  Surgeon:  Hobart Powell BRAVO, MD;  Location: [facility] END[facility]COPY;  Service: Cardiovascular;  Laterality: N/A;   cataract surgery  2015   Dr. Cleatus; March & April 2015   Patient Active Problem List   Diagnosis Date Noted   Dyspnea 05/30/2023   Pleural effusion 05/30/2023   Radon exposure 05/30/2023   Secondary hypercoagulable state (HCC) 12/12/2021   Hyponatremia 12/04/2021   Acute CHF (congestive heart failure) (HCC) 12/02/2021   Chronic diastolic heart failure (HCC) 12/02/2021   Atrial fibrillation with rapid ventricular response (HCC) 12/02/2021   Essential hypertension 12/02/2021   GERD without esophagitis 12/02/2021   Obstructive sleep apnea 12/02/2021   Interstitial lung disease (HCC) 12/02/2021   History of pulmonary embolism 12/02/2021   Pulmonary embolism (HCC) 11/06/2021   Bronchiectasis (HCC) 11/06/2021   Deviated septum 05/08/2020   Mass of subcutaneous tissue 05/02/2020   Vertigo 06/16/2019   Pulmonary hypertension, unspecified (HCC) 04/14/2019   Ischemic colitis (HCC) 04/14/2019   Migraine with aura and without status migrainosus, not intractable 11/08/2018   Degeneration of lumbar intervertebral disc 10/14/2018   Hoarseness of voice 03/04/2018   Abdominal pain 07/01/2017   Diverticulitis, colon    Metabolic acidosis, increased anion gap    Constipation 04/14/2017   Lumbar hernia 04/14/2017   Presbycusis of both ears 01/10/2017   Tinnitus  aurium, bilateral 01/10/2017   Gastroesophageal reflux disease 08/20/2016   Hemoptysis 08/20/2016   Obstructive sleep apnea of adult 08/20/2016   Rhinitis, chronic 08/20/2016   Throat pain in adult 08/20/2016   Fatigue 12/23/2015   Sciatica of right side 10/06/2015   History of migraine headaches 10/06/2015   Frequent PVCs 12/28/2013   Premature atrial contractions 12/28/2013   PSVT (paroxysmal supraventricular tachycardia) (HCC) 12/28/2013   Heart palpitations 07/13/2013   Sleep apnea 04/11/2013   Scoliosis 04/11/2013   Atypical  atrial flutter (HCC) 11/29/2012   Chest pain, atypical 11/29/2012   Fibromyalgia syndrome 11/29/2012   Chronic steroid use 11/29/2012    PCP: Okey Carlin Redbird, MD  REFERRING PROVIDER: Okey Carlin Redbird, MD  REFERRING DIAG: M41.9 (ICD-10-CM) - Scoliosis  Rationale for Evaluation and Treatment: Rehabilitation  THERAPY DIAG:  Abnormal posture  Cervicalgia  Other idiopathic scoliosis, thoracolumbar region  Pain in thoracic spine  Chronic bilateral low back pain without sciatica  Muscle spasm of back  Muscle weakness (generalized)  ONSET DATE: chronic, years of progressive pain from degenerative scoliosis  SUBJECTIVE:                                                                                                                                                                                           SUBJECTIVE STATEMENT: No progress has been made on my home repairs.  Still living out of boxes.  I am in a funk.  I am still considering getting a rollator but haven't gone to see it yet.    PERTINENT HISTORY:  Degenerative scoliosis, complex medical history including pulmonary HTN, frequent PVCs and a-flutter, history of cardioversion, osteoporosis Has been SOB with activity On waitlist to get into cardiologist sooner   PAIN:  PAIN:  Are you having pain? Yes NPRS scale: 6-7/10 Pain location: abdomen, left side of trunk, low back, intermittent bil hips/buttock/knee, Rt hip, neck bil Pain orientation: Other: see above  PAIN TYPE: aching, sharp, and tight Pain description: intermittent and constant  Aggravating factors: varies Relieving factors: unsure   PRECAUTIONS: Other: osteoporosis  RED FLAGS: None   WEIGHT BEARING RESTRICTIONS: No  FALLS:  Has patient fallen in last 6 months? No  LIVING ENVIRONMENT: Lives with: lives with their family and lives alone Lives in: House/apartment Stairs: No Has following equipment at home: has cane and walker but does not  use  OCCUPATION: retired  PLOF: Independent with basic ADLs, Independent with household mobility without device, Independent with community mobility without device, and Needs assistance with homemaking  PATIENT GOALS:  be able to eat, improve strength for bowel control, feel less pain and more  energy to expand social outlets and walking  NEXT MD VISIT: as needed  OBJECTIVE:  Note: Objective measures were completed at Evaluation unless otherwise noted.    COGNITION: Overall cognitive status: intact with improved outlook     SENSATION: WFL  MUSCLE LENGTH: 04/29/24: see palpation for updates 5/30: see palpation for updates 4/4 See palpation for updates Eval: Adaptive shortening of Lt obliques, QL, lat secondary to rigid scoliosis  POSTURE: rounded shoulders, forward head, decreased lumbar lordosis, and increased thoracic kyphosis, severe degenerative scoliosis with Lt SB/Rt Rot, left ribcage in near contact with left ilium  PALPATION: 04/29/24: Pt will improved soft tissue extensibility along Lt side of trunk, improving lateral costal expansion along Lt side of trunk, reducing trigger points in bil gluteals, presence of painful trigger points in Rt upper trap, levator and rhomboids today  5/30: Improved soft tissue extensibility along Lt side of trunk, improving lateral costal expansion along Lt side of trunk, improving gut sounds and motility along Lt side of trunk, reducing trigger points in Lt lat and shoulder  4/4:  Improving mobility of soft tissues along Lt side of trunk allowing reduced pressure on bowels, bladder, stomach to allow improved bowel movements, control of bowel and bladder Improving lateral costal expansion along Lt side of trunk for improved depth of inhale and stretching of intercostals  Eval:  Spasm present in bil upper traps Fascial and muscle mobility restriction along Lt side of trunk due to adaptive shortening from scoliosis: obliques, QL, hip flexors,  lat  LUMBAR ROM:   AROM eval 01/01/24 02/26/24 04/29/24  Flexion full full full full  Extension NT NT NT NT  Right lateral flexion 20% 30% 40% 50%  Left lateral flexion 40% 50% 75% 75%  Right rotation 40% 50% 50% 75%  Left rotation 20% 20% 30% 50%   (Blank rows = not tested)  LOWER EXTREMITY ROM:    WFL, flexion is full with good hamstring length   LOWER EXTREMITY MMT:   04/29/24: hip abd and ER 4+/5, scapular stabilizers 4/5 4/4: hip abd and ER 4/5 Eval: Grossly 4/5 throughout, 4-/5 hip abduction, ER  FUNCTIONAL TESTS:    GAIT: Distance walked: within clinic Assistive device utilized: None Level of assistance: Modified independence Comments: short stride length  TREATMENT DATE:  06/17/24 Rt SL manual therapy - broadening and elongation of Lt obliques, lat, QL, intercostals, paraspinals from cervical to lumbar, gluteals, hip flexors.  Gentle sacral distraction. Supine hamstring stretch 2x20 Supine hooklying alt march for TA engagement x20 Lt LE long leg reach in supine with Lt UE overhead reach for trunk elongation 5x10 Seated STM Rt trigger point release to upper trap, levator and rhomboids, Lt lat and posterior deltoid, bil neck STM Review of pelvic floor role and HEP from past episodes of care   06/10/24 Rt SL manual therapy - broadening and elongation of Lt obliques, lat, QL, intercostals, paraspinals from cervical to lumbar, gluteals, hip flexors.  Gentle sacral distraction. Seated STM Rt trigger point release to upper trap, levator and rhomboids, Lt lat and posterior deltoid, bil neck STM Pt education: discussion about community rec center activities for socialization and gentle exercise classes given she is so isolated living alone  05/27/24 Rt SL manual therapy - broadening and elongation of Lt obliques, lat, QL, intercostals, paraspinals from cervical to lumbar, gluteals, hip flexors.  Gentle sacral distraction. Seated STM Rt trigger point release to upper trap,  levator and rhomboids, Lt lat and posterior deltoid, bil neck STM Verbal review  of HEP for pelvic floor control and postural strength  05/16/24: Rt SL manual therapy - broadening and elongation of Lt obliques, lat, QL, intercostals, paraspinals from cervical to lumbar, gluteals, hip flexors.  Gentle sacral distraction. Seated STM Rt trigger point release to upper trap, levator and rhomboids, Lt lat and posterior deltoid, bil neck STM  05/06/24: Seated STM Rt trigger point release to upper trap, levator and rhomboids, Lt lat and posterior deltoid, bil neck STM Rt SL manual therapy - broadening and elongation of Lt obliques, QL, intercostals, paraspinals from cervical to lumbar, gluteals, hip flexors.  Gentle sacral distraction. Rt SL tactile cues with initial pressure and spring off for lateral costal expansion deep slow breathing with overhead reach of Lt UE and long leg reach of Lt LE                                                                              PATIENT EDUCATION:  Education details: Pt education (see above) Person educated: Patient Education method: Explanation Education comprehension: verbalized understanding  HOME EXERCISE PROGRAM: Journaling to outline some activities to try Pelvic floor - select exercises from past HEP Supine LE stretches from past HEP  Access Code: [email or web address] URL: [email or web address] Date: 12/25/2023 Prepared by: Orvil Layla Kesling  Exercises - Sidelying Diaphragmatic Breathing  - 1 x daily - 7 x weekly - 1 sets - 10 reps - Seated Hip Abduction with Resistance  - 1 x daily - 7 x weekly - 2 sets - 10 reps - Standing Row with Anchored Resistance  - 1 x daily - 7 x weekly - 2 sets - 10 reps - Standing Shoulder Extension with Resistance  - 1 x daily - 7 x weekly - 2 sets - 10 reps  ASSESSMENT:  CLINICAL IMPRESSION: Pt continues to report improved pain control with consistent PT and hopes to get out and try walking her cul-de-sac to work  on her endurance.  PT reiterated that a rollator to use for neighborhood walks would be a great option for endurance while still providing safety and rest option with built in seat.  She feels down overall and reported she is in a funk, and outdoor walking would benefit this as well.  She continues to have DOE and chronic pain.  Pt continues to benefit from consistent PT to address adaptive shortening of trunk muscles along Lt side from severe degenerative scoliosis.  Pt's condition affects multiple systems including GI, cardiopulmonary and musculoskeletal.  She feels with PT she has been better able to regulate her eating, digestion, breathing and activity level while maintaining a mostly ind lifestyle while living alone.  OBJECTIVE IMPAIRMENTS: cardiopulmonary status limiting activity, decreased activity tolerance, decreased coordination, decreased endurance, decreased mobility, decreased ROM, decreased strength, hypomobility, increased fascial restrictions, increased muscle spasms, impaired flexibility, impaired tone, improper body mechanics, postural dysfunction, and pain.   ACTIVITY LIMITATIONS: carrying, lifting, bending, sitting, standing, squatting, sleeping, stairs, transfers, bed mobility, continence, bathing, dressing, and locomotion level  PARTICIPATION LIMITATIONS: meal prep, cleaning, laundry, shopping, and community activity  PERSONAL FACTORS: Age, Behavior pattern, Time since onset of injury/illness/exacerbation, and 1-2 comorbidities: pulmonary HTN, a-flutter, osteoporosis, severe degenerative scoliosis are also affecting patient's functional outcome.  REHAB POTENTIAL: Good  CLINICAL DECISION MAKING: Evolving/moderate complexity  EVALUATION COMPLEXITY: Moderate   GOALS: Goals reviewed with patient? Yes  SHORT TERM GOALS: Target date: 11/27/23  Pt will journal and discover at least 1 activity to return to that she enjoys Baseline: Goal status: met, watching movies with  neighbors 3/7  2.  Pt will strive to eat consistent small meals throughout the day to initiate a bowel routine Baseline:  Goal status: met 4/4, most days  3.  Pt will report 20% less pain around abdomen and trunk to allow for comfort with small meals Baseline:  Goal status: met for most days 4/4    LONG TERM GOALS: Target date: 08/05/24  Pt will return to walking program at least 2x/week for multi-system health benefits. Baseline:  Goal status: will walk one lap around neighborhood 1-2x/day and participate in short errands  2.  Pt will be ind with HEP for gluteal and pelvic floor strength to reduce incidence of bowel leakage. Baseline:  Goal status: ongoing 8/1, notes about 75% less leakage  3.  Pt will establish nighttime routine including Cur[employer] app, medidation, breathing and stretching routine to improve sleep stretches. Baseline:  Goal status: MET 5/30, has established consistent routine  4.  Pt will report at least 50% less pain with daily tasks and eating secondary to reduced soft tissue compression surrounding scoliosis. Baseline:  Goal status: ONGOING 5/30, reports 50% improvement a few days of the week    PLAN:  PT FREQUENCY: 1-2x/week  PT DURATION: 8 weeks  PLANNED INTERVENTIONS: 97110-Therapeutic exercises, 97530- Therapeutic activity, 97112- Neuromuscular re-education, 97535- Self Care, [id number]- Manual therapy, Dry Needling, Joint mobilization, Spinal mobilization, Cryotherapy, and Moist heat.  PLAN FOR NEXT SESSION: continue to review PF HEP as needed to reinforce technique and purpose, floor transfer for HEP stretches/supine therex, postural strength, incorporate PF education and training with therex, ambulation for endurance, manual therapy  Aycen Porreca, PT 06/17/24 12:02 PM

## 2024-06-20 ENCOUNTER — Other Ambulatory Visit: Payer: Self-pay

## 2024-06-21 MED ORDER — DILTIAZEM HCL ER COATED BEADS 120 MG PO CP24: 120.0000 mg | ORAL_CAPSULE | Freq: Every day | ORAL | 3 refills | Status: AC

## 2024-06-23 ENCOUNTER — Ambulatory Visit: Admitting: Physical Therapy

## 2024-06-23 DIAGNOSIS — G8929 Other chronic pain: Secondary | ICD-10-CM

## 2024-06-23 DIAGNOSIS — M4125 Other idiopathic scoliosis, thoracolumbar region: Secondary | ICD-10-CM

## 2024-06-23 DIAGNOSIS — M546 Pain in thoracic spine: Secondary | ICD-10-CM | POA: Diagnosis not present

## 2024-06-23 DIAGNOSIS — M62830 Muscle spasm of back: Secondary | ICD-10-CM

## 2024-06-23 DIAGNOSIS — M542 Cervicalgia: Secondary | ICD-10-CM | POA: Diagnosis not present

## 2024-06-23 DIAGNOSIS — M545 Low back pain, unspecified: Secondary | ICD-10-CM | POA: Diagnosis not present

## 2024-06-23 DIAGNOSIS — R279 Unspecified lack of coordination: Secondary | ICD-10-CM

## 2024-06-23 DIAGNOSIS — R293 Abnormal posture: Secondary | ICD-10-CM

## 2024-06-23 DIAGNOSIS — M6281 Muscle weakness (generalized): Secondary | ICD-10-CM
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
## 2024-06-27 DIAGNOSIS — N1832 Chronic kidney disease, stage 3b: Secondary | ICD-10-CM | POA: Diagnosis not present

## 2024-07-01 ENCOUNTER — Ambulatory Visit: Attending: Family Medicine | Admitting: Physical Therapy

## 2024-07-01 ENCOUNTER — Encounter: Payer: Self-pay | Admitting: Physical Therapy

## 2024-07-01 DIAGNOSIS — R293 Abnormal posture: Secondary | ICD-10-CM | POA: Insufficient documentation

## 2024-07-01 DIAGNOSIS — M6281 Muscle weakness (generalized): Secondary | ICD-10-CM | POA: Insufficient documentation

## 2024-07-01 DIAGNOSIS — R279 Unspecified lack of coordination: Secondary | ICD-10-CM | POA: Insufficient documentation

## 2024-07-01 DIAGNOSIS — M4125 Other idiopathic scoliosis, thoracolumbar region: Secondary | ICD-10-CM | POA: Insufficient documentation

## 2024-07-01 DIAGNOSIS — M546 Pain in thoracic spine: Secondary | ICD-10-CM | POA: Insufficient documentation

## 2024-07-01 DIAGNOSIS — G8929 Other chronic pain: Secondary | ICD-10-CM | POA: Diagnosis not present

## 2024-07-01 DIAGNOSIS — M545 Low back pain, unspecified: Secondary | ICD-10-CM | POA: Diagnosis not present

## 2024-07-01 DIAGNOSIS — M542 Cervicalgia: Secondary | ICD-10-CM | POA: Diagnosis not present

## 2024-07-01 DIAGNOSIS — M62830 Muscle spasm of back: Secondary | ICD-10-CM | POA: Insufficient documentation
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
## 2024-07-04 DIAGNOSIS — N1832 Chronic kidney disease, stage 3b: Secondary | ICD-10-CM | POA: Diagnosis not present

## 2024-07-04 DIAGNOSIS — Z6821 Body mass index (BMI) 21.0-21.9, adult: Secondary | ICD-10-CM | POA: Diagnosis not present

## 2024-07-04 DIAGNOSIS — N390 Urinary tract infection, site not specified: Secondary | ICD-10-CM | POA: Diagnosis not present

## 2024-07-04 DIAGNOSIS — R300 Dysuria: Secondary | ICD-10-CM | POA: Diagnosis not present

## 2024-07-08 ENCOUNTER — Ambulatory Visit: Admitting: Physical Therapy

## 2024-07-08 ENCOUNTER — Encounter: Payer: Self-pay | Admitting: Physical Therapy

## 2024-07-08 DIAGNOSIS — M62830 Muscle spasm of back: Secondary | ICD-10-CM

## 2024-07-08 DIAGNOSIS — M4125 Other idiopathic scoliosis, thoracolumbar region: Secondary | ICD-10-CM

## 2024-07-08 DIAGNOSIS — M542 Cervicalgia: Secondary | ICD-10-CM

## 2024-07-08 DIAGNOSIS — M545 Low back pain, unspecified: Secondary | ICD-10-CM | POA: Diagnosis not present

## 2024-07-08 DIAGNOSIS — R293 Abnormal posture: Secondary | ICD-10-CM | POA: Diagnosis not present

## 2024-07-08 DIAGNOSIS — M6281 Muscle weakness (generalized): Secondary | ICD-10-CM

## 2024-07-08 DIAGNOSIS — M546 Pain in thoracic spine: Secondary | ICD-10-CM | POA: Diagnosis not present
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
## 2024-07-11 ENCOUNTER — Ambulatory Visit: Admitting: Physical Therapy

## 2024-07-13 ENCOUNTER — Encounter (HOSPITAL_BASED_OUTPATIENT_CLINIC_OR_DEPARTMENT_OTHER): Payer: Self-pay | Admitting: Pulmonary Disease

## 2024-07-13 ENCOUNTER — Ambulatory Visit (HOSPITAL_BASED_OUTPATIENT_CLINIC_OR_DEPARTMENT_OTHER): Admitting: Pulmonary Disease

## 2024-07-13 VITALS — BP 134/89 | HR 76 | Ht 60.0 in | Wt 111.0 lb

## 2024-07-13 DIAGNOSIS — S41101A Unspecified open wound of right upper arm, initial encounter: Secondary | ICD-10-CM | POA: Diagnosis not present

## 2024-07-13 DIAGNOSIS — R0609 Other forms of dyspnea: Secondary | ICD-10-CM | POA: Diagnosis not present

## 2024-07-13 NOTE — Progress Notes (Signed)
 Subjective:   PATIENT ID: Janet Nguyen GENDER: female DOB: 23-Sep-1932, MRN: [id number]  Chief Complaint  Patient presents with   Follow-up    Bronchiectasis    Reason for Visit: Follow-up   Janet Nguyen is a 89 year old female with bronchiectasis, scoliosis, hx PE 09/2021, hx OSA not on CPAP who presents with follow-up.  10/02/23 She is a patient of Dr. Geronimo and has intermittently tried Spiriva  but having difficulty handling the inhaler. Review of prior notes demonstrates she has had difficulty with flutter valve in the past as well. She is using albuterol  2-4 times a day and not on Spiriva  due to inability to use. She was also unsure if she could use Spiriva  with her Albuterol . Still has shortness of breath.  12/09/23 Since our last visit she reports her shortness of breath is unchanged. In the past she has been able to participate in yoga since retirement and feels her scoliosis/back pain has worsened. Has been participating in physical therapy on and off for >1 year. Currently enrolled in rehab this month. She reports shortness of breath that she attributes to her heart conditions, nasal congestion. She does report nose bleeds daily and knows the Eliquis  increases her risk for this. She reports she has been using Spiriva  daily but does not feel like it has been effective. Reports shortness of breath and chest tightness. Symptoms can occur at night. Denies wheezing.  05/16/24 She presents with her daughter Janet Nguyen. Since our last visit she reports breathing is overall worse. She acknowledges she is older but wants to know if there is anything she could do to help herself. Breztri  and Spiriva  in the past have been ineffective. Albuterol  1 puff once a day with some effect. Reports some chest heaviness/tightness.  07/13/24 She reports shortness of breath. No wheezing or cough. Has not using any nebulizers due to not picking it up and only uses albuterol  1 puff once a day in the  morning and sometimes and additional puff later in the day with activity. Has brought her prior medications including breztri , spacer and atrovent  nebulizer med. She has not used any of this meds.  Environmental exposures: Radon   Past Medical History:  Diagnosis Date   Arrhythmia    History of SVT with documented PVC'S and  PAC'S  12/08/12 Nuc stress test normal LV EF 74%  Event Monitor  12/01/12-01/03/13   Atrial flutter (HCC)    Celiac disease    treated by Dr. Luis   GERD (gastroesophageal reflux disease)    Hypertension    Intervertebral disc stenosis of neural canal of cervical region    Irregular heart beat 11/30/2012   ECHO-EF 60-65%   Osteoporosis    PMR (polymyalgia rheumatica)    Dr. Balinda; pt states she was diagnosed 10-15 years ago, not treated at this time or any issues that she is aware of.   Scoliosis    Scoliosis    Sleep apnea 10/02/11 [facility]   Sleep study AHI -total sleep 10.3/hr  64.0/ hr during REM sleep.RDI 22.8/hr during total sleep 64.0/hr during REM sleep The lowest O2 sat during Non-REM and REM sleep was 86% and 88% respectively. 04/08/12 CPAP/BIPAP titration study [facility]     Family History  Problem Relation Age of Onset   Breast cancer Mother    Heart disease Father    Migraines Neg Hx      Social History   Occupational History  Occupation: retired   Tobacco Use   Smoking status: Never   Smokeless tobacco: Never   Tobacco comments:    Never smoke 12/12/21  Vaping Use   Vaping status: Never Used  Substance and Sexual Activity   Alcohol use: Yes    Alcohol/week: 3.0 - 4.0 standard drinks of alcohol    Types: 3 - 4 Glasses of wine per week    Comment: 1 glass of wine 3-4 times weekly 12/12/21   Drug use: No   Sexual activity: Not on file    Allergies  Allergen Reactions   Gluten Meal Other (See Comments)    Unknown   Naproxen Other (See Comments)    Stomach upset   Amoxicillin  Rash      Outpatient Medications Prior to Visit  Medication Sig Dispense Refill   acetaminophen  (TYLENOL ) 500 MG tablet Take 500 mg by mouth every 8 (eight) hours as needed for moderate pain.     albuterol  (VENTOLIN  HFA) 108 (90 Base) MCG/ACT inhaler Inhale 2 puffs into the lungs every 4 (four) hours as needed. 18 g 3   ALPRAZolam  (XANAX ) 0.5 MG tablet Take 0.25-0.5 mg by mouth 2 (two) times daily as needed for anxiety.     apixaban  (ELIQUIS ) 2.5 MG TABS tablet Take 1 tablet (2.5 mg total) by mouth 2 (two) times daily. 60 tablet 5   Biotin w/ Vitamins C & E (HAIR SKIN & NAILS GUMMIES PO) Take 1 tablet by mouth daily.     Calcium Carb-Ergocalciferol (CHEWABLE CALCIUM/D PO) Take 1 each by mouth daily.     clidinium-chlordiazePOXIDE  (LIBRAX) 5-2.5 MG capsule Take 1 capsule by mouth daily as needed. 60 capsule 3   clidinium-chlordiazePOXIDE  (LIBRAX) 5-2.5 MG capsule Take 1 capsule by mouth.     digoxin  (LANOXIN ) 0.125 MG tablet TAKE 1/2 TABLET BY MOUTH DAILY 45 tablet 2   diltiazem  (CARDIZEM  CD) 120 MG 24 hr capsule Take 1 capsule (120 mg total) by mouth daily. 90 capsule 3   Doxylamine-Phenylephrine -APAP (SINUS & CONGESTION DAY/NIGHT) MISC 1 tablet Orally once a day As needed     latanoprost (XALATAN) 0.005 % ophthalmic solution As directed     METOPROLOL  SUCCINATE PO Take 100 mg by mouth in the morning and at bedtime.     OVER THE COUNTER MEDICATION  (Patient taking differently: Benefiber as needed)     Phenylephrine -APAP-guaiFENesin (EQ SINUS CONGESTION & PAIN PO) Take 1 tablet by mouth daily as needed (for sinus pain). Contains acetaminophen  325 mg and Phenylephrine  5 mg     potassium chloride  (KLOR-CON  M) 10 MEQ tablet Take 1 tablet (10 mEq total) by mouth daily. 90 tablet 3   torsemide  (DEMADEX ) 20 MG tablet Take 1 Tablet by mouth daily in the Morning and 1 Tablet by mouth daily in the evening (Patient taking differently: Take 20 mg by mouth 2 (two) times daily. Take 1 Tablet by mouth daily in the  Morning and 1 Tablet by mouth daily in the evening) 180 tablet 3   zolpidem  (AMBIEN ) 5 MG tablet Take 2.5 mg by mouth at bedtime.     No facility-administered medications prior to visit.    Review of Systems  Constitutional:  Negative for chills, diaphoresis, fever, malaise/fatigue and weight loss.  HENT:  Negative for congestion.   Respiratory:  Positive for shortness of breath. Negative for cough, hemoptysis, sputum production and wheezing.   Cardiovascular:  Negative for chest pain, palpitations and leg swelling.     Objective:   Vitals:  07/13/24 1109  BP: 134/89  Pulse: 76  SpO2: 96%  Weight: 111 lb (50.3 kg)  Height: 5' (1.524 m)   SpO2: 96 %  Physical Exam: General: Well-appearing, no acute distress HENT: [place], AT Eyes: EOMI, no scleral icterus Respiratory: Clear to auscultation bilaterally.  No crackles, wheezing or rales Cardiovascular: RRR, -M/R/G, no JVD Extremities:-Edema,-tenderness Neuro: AAO x4, CNII-XII grossly intact Psych: Normal mood, normal affect  Data Reviewed:  Imaging: CXR 03/02/23 - Bilateral pleural effusion CT Chest 02/20/23 - Loculated right pleural effusion and left pleural effusion. Chronic atelectasis in RML and lingula CXR 12/09/23 Resolved bilateral pleural effusions, chronic bronchial thickening and coarse lung markings     Assessment & Plan:   Discussion: 89 year old female with bronchiectasis, scoliosis, hx PE 09/2021, hx OSA not on CPAP who presents with follow-up. Worsening DOE. Currently on albuterol  PRN with partial improvement. Only taking it once a day. Unable to tolerate bronchodilators due to difficulty to use and no perceived benefit. Counseled on alternative options including nebulizer meds. She wishes to remain conservative in therapy as traveling outside the home is difficult.  Counseled on using inhalers or nebulizer for shortness of breath. Patient no longer wants nebulizer. Addressed inhaler regimen and addressed questions  about prior inhalers that she was previously prescribed.    Assessment & Plan DOE (dyspnea on exertion) likely multifactorial including bronchiectasis, loculated pleural effusion, deconditioning in setting of scoliosis, chronic cardiac issues --CONTINUE Albuterol  every 4 hours AS NEEDED for rescue. Try to minimize as this can worsen your atrial fibrillation --Encourage regular activity as tolerated Arm wound, right, initial encounter Secondary to mechanical fall --Keep wound clean and covered with dry dressing. Change dressing daily --Use adhesive bandage due to thin skin   Health Maintenance Immunization History  Administered Date(s) Administered   Fluad Quad(high Dose 65+) 07/11/2022   Fluzone  Influenza virus vaccine,trivalent (IIV3), split virus 06/26/2014, 06/29/2018   INFLUENZA, HIGH DOSE SEASONAL PF 06/15/2015, 06/19/2016, 07/24/2017, 05/24/2019, 06/20/2021   Influenza,inj,quad, With Preservative 06/29/2018   Influenza-Unspecified 06/30/2013   PFIZER Comirnaty(Gray Top)Covid-19 Tri-Sucrose Vaccine 02/21/2021   PFIZER(Purple Top)SARS-COV-2 Vaccination 10/14/2019, 11/04/2019, 07/18/2020, 07/01/2021   Pneumococcal Conjugate-13 03/09/2014   Tdap 03/21/2009, 05/29/2021    No orders of the defined types were placed in this encounter.  No orders of the defined types were placed in this encounter.   Return in about 6 months (around 01/11/2025).  I have spent a total time of 30-minutes on the day of the appointment including chart review, data review, collecting history, coordinating care and discussing medical diagnosis and plan with the patient/family. Past medical history, allergies, medications were reviewed. Pertinent imaging, labs and tests included in this note have been reviewed and interpreted independently by me.  Lillybeth Tal Slater Staff, MD [facility] 07/13/2024 11:53 AM

## 2024-07-13 NOTE — Patient Instructions (Signed)
--  CONTINUE Albuterol  every 4 hours AS NEEDED for rescue. Try to minimize as this can worsen your atrial fibrillation --Encourage regular activity as tolerated  Right forearm abrasion secondary to mechanical fall --Keep wound clean and covered with dry dressing. Change dressing daily --Use adhesive bandage due to thin skin

## 2024-07-13 NOTE — Assessment & Plan Note (Signed)
 likely multifactorial including bronchiectasis, loculated pleural effusion, deconditioning in setting of scoliosis, chronic cardiac issues --CONTINUE Albuterol  every 4 hours AS NEEDED for rescue. Try to minimize as this can worsen your atrial fibrillation --Encourage regular activity as tolerated

## 2024-07-14 DIAGNOSIS — G43B1 Ophthalmoplegic migraine, intractable: Secondary | ICD-10-CM | POA: Diagnosis not present

## 2024-07-14 DIAGNOSIS — H40013 Open angle with borderline findings, low risk, bilateral: Secondary | ICD-10-CM | POA: Diagnosis not present

## 2024-07-14 DIAGNOSIS — H538 Other visual disturbances: Secondary | ICD-10-CM | POA: Diagnosis not present

## 2024-07-22 ENCOUNTER — Ambulatory Visit: Admitting: Physical Therapy

## 2024-07-22 ENCOUNTER — Encounter: Payer: Self-pay | Admitting: Physical Therapy

## 2024-07-22 DIAGNOSIS — M62830 Muscle spasm of back: Secondary | ICD-10-CM | POA: Diagnosis not present

## 2024-07-22 DIAGNOSIS — M4125 Other idiopathic scoliosis, thoracolumbar region: Secondary | ICD-10-CM | POA: Diagnosis not present

## 2024-07-22 DIAGNOSIS — M545 Low back pain, unspecified: Secondary | ICD-10-CM | POA: Diagnosis not present

## 2024-07-22 DIAGNOSIS — R293 Abnormal posture: Secondary | ICD-10-CM | POA: Diagnosis not present

## 2024-07-22 DIAGNOSIS — M542 Cervicalgia: Secondary | ICD-10-CM

## 2024-07-22 DIAGNOSIS — M6281 Muscle weakness (generalized): Secondary | ICD-10-CM

## 2024-07-22 DIAGNOSIS — R279 Unspecified lack of coordination: Secondary | ICD-10-CM

## 2024-07-22 DIAGNOSIS — M546 Pain in thoracic spine: Secondary | ICD-10-CM | POA: Diagnosis not present

## 2024-07-22 DIAGNOSIS — G8929 Other chronic pain: Secondary | ICD-10-CM

## 2024-07-22 NOTE — Therapy (Signed)
 OUTPATIENT PHYSICAL THERAPY THORACOLUMBAR TREATMENT  Progress Note Reporting Period 04/11/24 to 07/22/24  See note below for Objective Data and Assessment of Progress/Goals.      Patient Name: Janet Nguyen MRN: [id number] DOB:02/22/1932, 89 y.o., female Today's Date: 07/22/2024  END OF SESSION:  PT End of Session - 07/22/24 1107     Visit Number 27    Date for Recertification  08/05/24    Authorization Type Medicare part A/B - KX at visit 15    Progress Note Due on Visit 27    PT Start Time 1105    PT Stop Time 1145    PT Time Calculation (min) 40 min    Activity Tolerance Patient tolerated treatment well    Behavior During Therapy [facility] for tasks assessed/performed              Past Medical History:  Diagnosis Date   Arrhythmia    History of SVT with documented PVC'S and  PAC'S  12/08/12 Nuc stress test normal LV EF 74%  Event Monitor  12/01/12-01/03/13   Atrial flutter (HCC)    Celiac disease    treated by Dr. Luis   GERD (gastroesophageal reflux disease)    Hypertension    Intervertebral disc stenosis of neural canal of cervical region    Irregular heart beat 11/30/2012   ECHO-EF 60-65%   Osteoporosis    PMR (polymyalgia rheumatica)    Dr. Balinda; pt states she was diagnosed 10-15 years ago, not treated at this time or any issues that she is aware of.   Scoliosis    Scoliosis    Sleep apnea 10/02/11 [facility]   Sleep study AHI -total sleep 10.3/hr  64.0/ hr during REM sleep.RDI 22.8/hr during total sleep 64.0/hr during REM sleep The lowest O2 sat during Non-REM and REM sleep was 86% and 88% respectively. 04/08/12 CPAP/BIPAP titration study [facility]   Past Surgical History:  Procedure Laterality Date   ANAL RECTAL MANOMETRY N/A 09/16/2023   Procedure: ANO RECTAL MANOMETRY;  Surgeon: Burnette Fallow, MD;  Location: [facility] ENDOSCOPY;  Service: Gastroenterology;  Laterality: N/A;   APPENDECTOMY     ruptured at age 48  and had surgery   CARDIAC CATHETERIZATION  01/27/06   CARDIOVERSION N/A 01/08/2022   Procedure: CARDIOVERSION;  Surgeon: Hobart Powell BRAVO, MD;  Location[facility] END[facility]COPY;  Service: Cardiovascular;  Laterality: N/A;   cataract surgery  2015   Dr. Cleatus; March & April 2015   Patient Active Problem List   Diagnosis Date Noted   DOE (dyspnea on exertion) 05/30/2023   Pleural effusion 05/30/2023   Radon exposure 05/30/2023   Secondary hypercoagulable state 12/12/2021   Hyponatremia 12/04/2021   Acute CHF (congestive heart failure) (HCC) 12/02/2021   Chronic diastolic heart failure (HCC) 12/02/2021   Atrial fibrillation with rapid ventricular response (HCC) 12/02/2021   Essential hypertension 12/02/2021   GERD without esophagitis 12/02/2021   Obstructive sleep apnea 12/02/2021   Interstitial lung disease (HCC) 12/02/2021   History of pulmonary embolism 12/02/2021   Pulmonary embolism (HCC) 11/06/2021   Bronchiectasis (HCC) 11/06/2021   Deviated septum 05/08/2020   Mass of subcutaneous tissue 05/02/2020   Vertigo 06/16/2019   Pulmonary hypertension, unspecified (HCC) 04/14/2019   Ischemic colitis 04/14/2019   Migraine with aura and without status migrainosus, not intractable 11/08/2018   Degeneration of lumbar intervertebral disc 10/14/2018   Hoarseness of voice 03/04/2018   Abdominal pain 07/01/2017   Diverticulitis, colon    Metabolic  acidosis, increased anion gap    Constipation 04/14/2017   Lumbar hernia 04/14/2017   Presbycusis of both ears 01/10/2017   Tinnitus aurium, bilateral 01/10/2017   Gastroesophageal reflux disease 08/20/2016   Hemoptysis 08/20/2016   Obstructive sleep apnea of adult 08/20/2016   Rhinitis, chronic 08/20/2016   Throat pain in adult 08/20/2016   Fatigue 12/23/2015   Sciatica of right side 10/06/2015   History of migraine headaches 10/06/2015   Frequent PVCs 12/28/2013   Premature atrial contractions 12/28/2013   PSVT (paroxysmal supraventricular  tachycardia) (HCC) 12/28/2013   Heart palpitations 07/13/2013   Sleep apnea 04/11/2013   Scoliosis 04/11/2013   Atypical atrial flutter (HCC) 11/29/2012   Chest pain, atypical 11/29/2012   Fibromyalgia syndrome 11/29/2012   Chronic steroid use 11/29/2012    PCP: Okey Carlin Redbird, MD  REFERRING PROVIDER: Okey Carlin Redbird, MD  REFERRING DIAG: M41.9 (ICD-10-CM) - Scoliosis  Rationale for Evaluation and Treatment: Rehabilitation  THERAPY DIAG:  Abnormal posture  Cervicalgia  Other idiopathic scoliosis, thoracolumbar region  Pain in thoracic spine  Muscle spasm of back  Chronic bilateral low back pain without sciatica  Muscle weakness (generalized)  Unspecified lack of coordination  ONSET DATE: chronic, years of progressive pain from degenerative scoliosis  SUBJECTIVE:                                                                                                                                                                                           SUBJECTIVE STATEMENT: I had a fall.  I am bruised along my Rt forearm.  My back has been painful and itchy.  PERTINENT HISTORY:  Degenerative scoliosis, complex medical history including pulmonary HTN, frequent PVCs and a-flutter, history of cardioversion, osteoporosis Has been SOB with activity On waitlist to get into cardiologist sooner   PAIN:  PAIN:  Are you having pain? Yes NPRS scale: 8/10 Pain location: abdomen, left side of trunk, low back, intermittent bil hips/buttock/knee, Rt hip, neck bil Pain orientation: Other: see above  PAIN TYPE: aching, sharp, and tight Pain description: intermittent and constant  Aggravating factors: varies Relieving factors: unsure   PRECAUTIONS: Other: osteoporosis  RED FLAGS: None   WEIGHT BEARING RESTRICTIONS: No  FALLS:  Has patient fallen in last 6 months? No  LIVING ENVIRONMENT: Lives with: lives with their family and lives alone Lives in:  House/apartment Stairs: No Has following equipment at home: has cane and walker but does not use  OCCUPATION: retired  PLOF: Independent with basic ADLs, Independent with household mobility without device, Independent with community mobility without device, and Needs assistance with homemaking  PATIENT GOALS:  be able to  eat, improve strength for bowel control, feel less pain and more energy to expand social outlets and walking  NEXT MD VISIT: as needed  OBJECTIVE:  Note: Objective measures were completed at Evaluation unless otherwise noted.    COGNITION: Overall cognitive status: intact with improved outlook     SENSATION: WFL  MUSCLE LENGTH: 07/22/24: see palpation for updates 04/29/24: see palpation for updates 5/30: see palpation for updates 4/4 See palpation for updates Eval: Adaptive shortening of Lt obliques, QL, lat secondary to rigid scoliosis  POSTURE: rounded shoulders, forward head, decreased lumbar lordosis, and increased thoracic kyphosis, severe degenerative scoliosis with Lt SB/Rt Rot, left ribcage in near contact with left ilium  PALPATION: 07/22/24: Maintaining improved soft tissue extensibility along Lt side of trunk, improving lateral costal expansion along Lt side of trunk, reducing trigger points in bil gluteals  04/29/24: Pt will improved soft tissue extensibility along Lt side of trunk, improving lateral costal expansion along Lt side of trunk, reducing trigger points in bil gluteals, presence of painful trigger points in Rt upper trap, levator and rhomboids today  5/30: Improved soft tissue extensibility along Lt side of trunk, improving lateral costal expansion along Lt side of trunk, improving gut sounds and motility along Lt side of trunk, reducing trigger points in Lt lat and shoulder  4/4:  Improving mobility of soft tissues along Lt side of trunk allowing reduced pressure on bowels, bladder, stomach to allow improved bowel movements, control of  bowel and bladder Improving lateral costal expansion along Lt side of trunk for improved depth of inhale and stretching of intercostals  Eval:  Spasm present in bil upper traps Fascial and muscle mobility restriction along Lt side of trunk due to adaptive shortening from scoliosis: obliques, QL, hip flexors, lat  LUMBAR ROM:   AROM eval 01/01/24 02/26/24 04/29/24 07/22/24  Flexion full full full full full  Extension NT NT NT NT NT  Right lateral flexion 20% 30% 40% 50% 75%  Left lateral flexion 40% 50% 75% 75% 75%  Right rotation 40% 50% 50% 75% 75%  Left rotation 20% 20% 30% 50% 75%   (Blank rows = not tested)  LOWER EXTREMITY ROM:    WFL, flexion is full with good hamstring length   LOWER EXTREMITY MMT:   07/22/24: LE grossly 4+/5, scapular stabilizers 4+/5 04/29/24: hip abd and ER 4+/5, scapular stabilizers 4/5 4/4: hip abd and ER 4/5 Eval: Grossly 4/5 throughout, 4-/5 hip abduction, ER  FUNCTIONAL TESTS:    GAIT: Distance walked: within clinic Assistive device utilized: None Level of assistance: Modified independence Comments: short stride length  TREATMENT DATE:  07/22/24 Rt SL manual therapy - broadening and elongation of Lt obliques, lat, QL, intercostals, paraspinals from cervical to lumbar, gluteals, hip flexors.  Gentle sacral distraction. Rt hip deep tissue massage. Supine piriformis 3x10 Supine HS stretch with ankle pumps 2x30 Supine bicycle x20 SKTC 3x10 bil SLR x10 each Supine bridges x10 Seated STM to bil pectorals, bil upper traps, cervical paraspinals Conversation about discharge planning for end of auth period, reviewed HEP and importance of compliance  07/08/24 Rt SL manual therapy - broadening and elongation of Lt obliques, lat, QL, intercostals, paraspinals from cervical to lumbar, gluteals, hip flexors.  Gentle sacral distraction. Rt hip deep tissue massage. Supine piriformis stretch 2x30 Supine SKTC 3x10 SLR 2x10 bil Rt shoulder supine d2  flexion yellow Supine bil shoulder pulldowns yellow tband Supine bicycle x 20 Supine hooklying clam yellow loop x20 Supine bridge 10x with hip abd  yellow loop  07/01/24 Rt SL manual therapy - broadening and elongation of Lt obliques, lat, QL, intercostals, paraspinals from cervical to lumbar, gluteals, hip flexors.  Gentle sacral distraction. Rt hip deep tissue massage. Supine DKTC with rocking side to side x20 Supine hamstring stretch x30 each, then x30 with ankle circles both ways, ankle pumps  Supine bridge x10 Supine LE bicycle for core and hip flexor strength x20 SL pillow squeeze with PF squeeze 10x5 Supine red tband horiz abd, ER D2 flexion for postural strength Supine long axis reach of Lt LE and Lt UE to stretch Lt trunk 3x30 Chair massage to bil pectorals and bil upper traps                                                                              PATIENT EDUCATION:  Education details: Pt education (see above) Person educated: Patient Education method: Explanation Education comprehension: verbalized understanding  HOME EXERCISE PROGRAM:  Access Code: [email or web address] URL: [email or web address] Date: 07/08/2024 Prepared by: Orvil Henslee Lottman  Exercises - Supine Figure 4 Piriformis Stretch  - 1 x daily - 7 x weekly - 1 sets - 3 reps - 10 hold - Supine Single Knee to Chest Stretch  - 1 x daily - 7 x weekly - 1 sets - 3 reps - 10 hold - Supine Core Bicycle  - 1 x daily - 7 x weekly - 2 sets - 20 reps - Supine Hamstring Stretch  - 1 x daily - 7 x weekly - 1 sets - 2 reps - 30 hold - Active Straight Leg Raise with Quad Set  - 1 x daily - 7 x weekly - 2 sets - 10 reps - Bridge with Hip Abduction and Resistance - Ground Touches  - 1 x daily - 7 x weekly - 2 sets - 10 reps  ASSESSMENT:  CLINICAL IMPRESSION: Pt has been more compliant with HEP.  She is reaching a plateau in progress and will be ready to d/c at the end of this auth period in early Nov.  She still  hasn't gotten a rollator to work more on safe ambulation for endurance.  She does benefit from skilled PT focused on trunk elongation and decompression of concave aspects of spine and shortened soft tissues to take pressure off lungs and digestive system.  OBJECTIVE IMPAIRMENTS: cardiopulmonary status limiting activity, decreased activity tolerance, decreased coordination, decreased endurance, decreased mobility, decreased ROM, decreased strength, hypomobility, increased fascial restrictions, increased muscle spasms, impaired flexibility, impaired tone, improper body mechanics, postural dysfunction, and pain.   ACTIVITY LIMITATIONS: carrying, lifting, bending, sitting, standing, squatting, sleeping, stairs, transfers, bed mobility, continence, bathing, dressing, and locomotion level  PARTICIPATION LIMITATIONS: meal prep, cleaning, laundry, shopping, and community activity  PERSONAL FACTORS: Age, Behavior pattern, Time since onset of injury/illness/exacerbation, and 1-2 comorbidities: pulmonary HTN, a-flutter, osteoporosis, severe degenerative scoliosis are also affecting patient's functional outcome.   REHAB POTENTIAL: Good  CLINICAL DECISION MAKING: Evolving/moderate complexity  EVALUATION COMPLEXITY: Moderate   GOALS: Goals reviewed with patient? Yes  SHORT TERM GOALS: Target date: 11/27/23  Pt will journal and discover at least 1 activity to return to that she enjoys Baseline: Goal status: met, watching movies with neighbors 3/7  2.  Pt will strive to eat consistent small meals throughout the day to initiate a bowel routine Baseline:  Goal status: met 4/4, most days  3.  Pt will report 20% less pain around abdomen and trunk to allow for comfort with small meals Baseline:  Goal status: met for most days 4/4    LONG TERM GOALS: Target date: 08/05/24  Pt will return to walking program at least 2x/week for multi-system health benefits. Baseline:  Goal status: will walk one lap  around neighborhood 1-2x/day and participate in short errands - ONGOING 10/24  2.  Pt will be ind with HEP for gluteal and pelvic floor strength to reduce incidence of bowel leakage. Baseline:  Goal status: MET 10/24  3.  Pt will establish nighttime routine including Curable app, medidation, breathing and stretching routine to improve sleep stretches. Baseline:  Goal status: MET 5/30, has established consistent routine  4.  Pt will report at least 50% less pain with daily tasks and eating secondary to reduced soft tissue compression surrounding scoliosis. Baseline:  Goal status: ONGOING 10/24 reports 50% improvement a few days of the week    PLAN:  PT FREQUENCY: 1-2x/week  PT DURATION: 8 weeks  PLANNED INTERVENTIONS: 97110-Therapeutic exercises, 97530- Therapeutic activity, 97112- Neuromuscular re-education, 97535- Self Care, [id number]- Manual therapy, Dry Needling, Joint mobilization, Spinal mobilization, Cryotherapy, and Moist heat.  PLAN FOR NEXT SESSION: 2 more visits and then d/c, continue to review PF HEP as needed to reinforce technique and purpose, floor transfer for HEP stretches/supine therex, postural strength, incorporate PF education and training with therex, ambulation for endurance, manual therapy  Peyton Rossner, PT 07/22/24 11:56 AM

## 2024-07-29 ENCOUNTER — Encounter: Payer: Self-pay | Admitting: Physical Therapy

## 2024-07-29 ENCOUNTER — Ambulatory Visit: Admitting: Physical Therapy

## 2024-07-29 DIAGNOSIS — M546 Pain in thoracic spine: Secondary | ICD-10-CM

## 2024-07-29 DIAGNOSIS — M62830 Muscle spasm of back: Secondary | ICD-10-CM

## 2024-07-29 DIAGNOSIS — R293 Abnormal posture: Secondary | ICD-10-CM | POA: Diagnosis not present

## 2024-07-29 DIAGNOSIS — M4125 Other idiopathic scoliosis, thoracolumbar region: Secondary | ICD-10-CM

## 2024-07-29 DIAGNOSIS — M6281 Muscle weakness (generalized): Secondary | ICD-10-CM

## 2024-07-29 DIAGNOSIS — M542 Cervicalgia: Secondary | ICD-10-CM

## 2024-07-29 DIAGNOSIS — G8929 Other chronic pain: Secondary | ICD-10-CM

## 2024-07-29 DIAGNOSIS — M545 Low back pain, unspecified: Secondary | ICD-10-CM | POA: Diagnosis not present

## 2024-07-29 NOTE — Therapy (Signed)
 OUTPATIENT PHYSICAL THERAPY THORACOLUMBAR TREATMENT    Patient Name: Janet Nguyen MRN: [id number] DOB:[birth year], 89 y.o., female Today's Date: 07/29/2024  END OF SESSION:  PT End of Session - 07/29/24 1056     Visit Number 28    Date for Recertification  08/05/24    Authorization Type Medicare part A/B - KX at visit 15    Progress Note Due on Visit 37    PT Start Time 1100    PT Stop Time 1145    PT Time Calculation (min) 45 min    Activity Tolerance Patient tolerated treatment well    Behavior During Therapy [place] for tasks assessed/performed               Past Medical History:  Diagnosis Date   Arrhythmia    History of SVT with documented PVC'S and  PAC'S  12/08/12 Nuc stress test normal LV EF 74%  Event Monitor  12/01/12-01/03/13   Atrial flutter (HCC)    Celiac disease    treated by Dr. Luis   GERD (gastroesophageal reflux disease)    Hypertension    Intervertebral disc stenosis of neural canal of cervical region    Irregular heart beat 11/30/2012   ECHO-EF 60-65%   Osteoporosis    PMR (polymyalgia rheumatica)    Dr. Balinda; pt states she was diagnosed 10-15 years ago, not treated at this time or any issues that she is aware of.   Scoliosis    Scoliosis    Sleep apnea 10/02/11 [facility]   Sleep study AHI -total sleep 10.3/hr  64.0/ hr during REM sleep.RDI 22.8/hr during total sleep 64.0/hr during REM sleep The lowest O2 sat during Non-REM and REM sleep was 86% and 88% respectively. 04/08/12 CPAP/BIPAP titration study [facility]   Past Surgical History:  Procedure Laterality Date   ANAL RECTAL MANOMETRY N/A 09/16/2023   Procedure: ANO RECTAL MANOMETRY;  Surgeon: Burnette Fallow, MD;  Location: [facility];  Service: Gastroenterology;  Laterality: N/A;   APPENDECTOMY     ruptured at age 74 and had surgery   CARDIAC CATHETERIZATION  01/27/06   CARDIOVERSION N/A 01/08/2022   Procedure: CARDIOVERSION;  Surgeon:  Hobart Powell BRAVO, MD;  Location: [facility];  Service: Cardiovascular;  Laterality: N/A;   cataract surgery  2015   Dr. Cleatus; March & April 2015   Patient Active Problem List   Diagnosis Date Noted   DOE (dyspnea on exertion) 05/30/2023   Pleural effusion 05/30/2023   Radon exposure 05/30/2023   Secondary hypercoagulable state 12/12/2021   Hyponatremia 12/04/2021   Acute CHF (congestive heart failure) (HCC) 12/02/2021   Chronic diastolic heart failure (HCC) 12/02/2021   Atrial fibrillation with rapid ventricular response (HCC) 12/02/2021   Essential hypertension 12/02/2021   GERD without esophagitis 12/02/2021   Obstructive sleep apnea 12/02/2021   Interstitial lung disease (HCC) 12/02/2021   History of pulmonary embolism 12/02/2021   Pulmonary embolism (HCC) 11/06/2021   Bronchiectasis (HCC) 11/06/2021   Deviated septum 05/08/2020   Mass of subcutaneous tissue 05/02/2020   Vertigo 06/16/2019   Pulmonary hypertension, unspecified (HCC) 04/14/2019   Ischemic colitis 04/14/2019   Migraine with aura and without status migrainosus, not intractable 11/08/2018   Degeneration of lumbar intervertebral disc 10/14/2018   Hoarseness of voice 03/04/2018   Abdominal pain 07/01/2017   Diverticulitis, colon    Metabolic acidosis, increased anion gap    Constipation 04/14/2017   Lumbar hernia 04/14/2017   Presbycusis of both ears  01/10/2017   Tinnitus aurium, bilateral 01/10/2017   Gastroesophageal reflux disease 08/20/2016   Hemoptysis 08/20/2016   Obstructive sleep apnea of adult 08/20/2016   Rhinitis, chronic 08/20/2016   Throat pain in adult 08/20/2016   Fatigue 12/23/2015   Sciatica of right side 10/06/2015   History of migraine headaches 10/06/2015   Frequent PVCs 12/28/2013   Premature atrial contractions 12/28/2013   PSVT (paroxysmal supraventricular tachycardia) (HCC) 12/28/2013   Heart palpitations 07/13/2013   Sleep apnea 04/11/2013   Scoliosis 04/11/2013    Atypical atrial flutter (HCC) 11/29/2012   Chest pain, atypical 11/29/2012   Fibromyalgia syndrome 11/29/2012   Chronic steroid use 11/29/2012    PCP: Okey Carlin Redbird, MD  REFERRING PROVIDER: Okey Carlin Redbird, MD  REFERRING DIAG: M41.9 (ICD-10-CM) - Scoliosis  Rationale for Evaluation and Treatment: Rehabilitation  THERAPY DIAG:  Abnormal posture  Cervicalgia  Other idiopathic scoliosis, thoracolumbar region  Pain in thoracic spine  Muscle spasm of back  Chronic bilateral low back pain without sciatica  Muscle weakness (generalized)  ONSET DATE: chronic, years of progressive pain from degenerative scoliosis  SUBJECTIVE:                                                                                                                                                                                           SUBJECTIVE STATEMENT: I am so out of breath.  PERTINENT HISTORY:  Degenerative scoliosis, complex medical history including pulmonary HTN, frequent PVCs and a-flutter, history of cardioversion, osteoporosis Has been SOB with activity On waitlist to get into cardiologist sooner   PAIN:  PAIN:  Are you having pain? Yes NPRS scale: 8/10 Pain location: abdomen, left side of trunk, low back, intermittent bil hips/buttock/knee, Rt hip, neck bil Pain orientation: Other: see above  PAIN TYPE: aching, sharp, and tight Pain description: intermittent and constant  Aggravating factors: varies Relieving factors: unsure   PRECAUTIONS: Other: osteoporosis  RED FLAGS: None   WEIGHT BEARING RESTRICTIONS: No  FALLS:  Has patient fallen in last 6 months? No  LIVING ENVIRONMENT: Lives with: lives with their family and lives alone Lives in: House/apartment Stairs: No Has following equipment at home: has cane and walker but does not use  OCCUPATION: retired  PLOF: Independent with basic ADLs, Independent with household mobility without device, Independent with  community mobility without device, and Needs assistance with homemaking  PATIENT GOALS:  be able to eat, improve strength for bowel control, feel less pain and more energy to expand social outlets and walking  NEXT MD VISIT: as needed  OBJECTIVE:  Note: Objective measures were completed at Evaluation unless otherwise noted.  COGNITION: Overall cognitive status: intact with improved outlook     SENSATION: WFL  MUSCLE LENGTH: 07/22/24: see palpation for updates 04/29/24: see palpation for updates 5/30: see palpation for updates 4/4 See palpation for updates Eval: Adaptive shortening of Lt obliques, QL, lat secondary to rigid scoliosis  POSTURE: rounded shoulders, forward head, decreased lumbar lordosis, and increased thoracic kyphosis, severe degenerative scoliosis with Lt SB/Rt Rot, left ribcage in near contact with left ilium  PALPATION: 07/22/24: Maintaining improved soft tissue extensibility along Lt side of trunk, improving lateral costal expansion along Lt side of trunk, reducing trigger points in bil gluteals  04/29/24: Pt will improved soft tissue extensibility along Lt side of trunk, improving lateral costal expansion along Lt side of trunk, reducing trigger points in bil gluteals, presence of painful trigger points in Rt upper trap, levator and rhomboids today  5/30: Improved soft tissue extensibility along Lt side of trunk, improving lateral costal expansion along Lt side of trunk, improving gut sounds and motility along Lt side of trunk, reducing trigger points in Lt lat and shoulder  4/4:  Improving mobility of soft tissues along Lt side of trunk allowing reduced pressure on bowels, bladder, stomach to allow improved bowel movements, control of bowel and bladder Improving lateral costal expansion along Lt side of trunk for improved depth of inhale and stretching of intercostals  Eval:  Spasm present in bil upper traps Fascial and muscle mobility restriction along Lt  side of trunk due to adaptive shortening from scoliosis: obliques, QL, hip flexors, lat  LUMBAR ROM:   AROM eval 01/01/24 02/26/24 04/29/24 07/22/24  Flexion full full full full full  Extension NT NT NT NT NT  Right lateral flexion 20% 30% 40% 50% 75%  Left lateral flexion 40% 50% 75% 75% 75%  Right rotation 40% 50% 50% 75% 75%  Left rotation 20% 20% 30% 50% 75%   (Blank rows = not tested)  LOWER EXTREMITY ROM:    WFL, flexion is full with good hamstring length   LOWER EXTREMITY MMT:   07/22/24: LE grossly 4+/5, scapular stabilizers 4+/5 04/29/24: hip abd and ER 4+/5, scapular stabilizers 4/5 4/4: hip abd and ER 4/5 Eval: Grossly 4/5 throughout, 4-/5 hip abduction, ER  FUNCTIONAL TESTS:    GAIT: Distance walked: within clinic Assistive device utilized: None Level of assistance: Modified independence Comments: short stride length  TREATMENT DATE:  07/29/24 Rt SL manual therapy - broadening and elongation of Lt obliques, lat, QL, intercostals, paraspinals from cervical to lumbar, gluteals, hip flexors.  Gentle sacral distraction. Rt hip deep tissue massage. Supine piriformis 3x10 Supine HS stretch with ankle pumps 2x30 Supine bicycle x20 SKTC 3x10 bil SLR x10 each Supine bridges x10 Supine red tband bil horiz abd x10 and bil ER x10 for postural strength Standing bil red tband row x20 Seated STM to bil pectorals, bil upper traps, cervical paraspinals  07/22/24 Rt SL manual therapy - broadening and elongation of Lt obliques, lat, QL, intercostals, paraspinals from cervical to lumbar, gluteals, hip flexors.  Gentle sacral distraction. Rt hip deep tissue massage. Supine piriformis 3x10 Supine HS stretch with ankle pumps 2x30 Supine bicycle x20 SKTC 3x10 bil SLR x10 each Supine bridges x10 Seated STM to bil pectorals, bil upper traps, cervical paraspinals Conversation about discharge planning for end of auth period, reviewed HEP and importance of  compliance  07/08/24 Rt SL manual therapy - broadening and elongation of Lt obliques, lat, QL, intercostals, paraspinals from cervical to lumbar, gluteals, hip flexors.  Gentle  sacral distraction. Rt hip deep tissue massage. Supine piriformis stretch 2x30 Supine SKTC 3x10 SLR 2x10 bil Rt shoulder supine d2 flexion yellow Supine bil shoulder pulldowns yellow tband Supine bicycle x 20 Supine hooklying clam yellow loop x20 Supine bridge 10x with hip abd yellow loop                                                                               PATIENT EDUCATION:  Education details: Pt education (see above) Person educated: Patient Education method: Explanation Education comprehension: verbalized understanding  HOME EXERCISE PROGRAM:  Access Code: [email or web address] URL: [email or web address] Date: 07/08/2024 Prepared by: Orvil Abdulkadir Emmanuel  Exercises - Supine Figure 4 Piriformis Stretch  - 1 x daily - 7 x weekly - 1 sets - 3 reps - 10 hold - Supine Single Knee to Chest Stretch  - 1 x daily - 7 x weekly - 1 sets - 3 reps - 10 hold - Supine Core Bicycle  - 1 x daily - 7 x weekly - 2 sets - 20 reps - Supine Hamstring Stretch  - 1 x daily - 7 x weekly - 1 sets - 2 reps - 30 hold - Active Straight Leg Raise with Quad Set  - 1 x daily - 7 x weekly - 2 sets - 10 reps - Bridge with Hip Abduction and Resistance - Ground Touches  - 1 x daily - 7 x weekly - 2 sets - 10 reps  ASSESSMENT:  CLINICAL IMPRESSION: Pt has been more compliant with HEP.  She is reaching a plateau in progress and will be ready to d/c at next visit.  Her daughter is coming to visit next week and PT encouraged Pt to go to medical supply store to look at rollators to give her more support for community ambulation.  Pt continues to be very short of breath and benefits greatly from [facility] to open up compressed side of scoliosis curves.  OBJECTIVE IMPAIRMENTS: cardiopulmonary status limiting activity, decreased activity  tolerance, decreased coordination, decreased endurance, decreased mobility, decreased ROM, decreased strength, hypomobility, increased fascial restrictions, increased muscle spasms, impaired flexibility, impaired tone, improper body mechanics, postural dysfunction, and pain.   ACTIVITY LIMITATIONS: carrying, lifting, bending, sitting, standing, squatting, sleeping, stairs, transfers, bed mobility, continence, bathing, dressing, and locomotion level  PARTICIPATION LIMITATIONS: meal prep, cleaning, laundry, shopping, and community activity  PERSONAL FACTORS: Age, Behavior pattern, Time since onset of injury/illness/exacerbation, and 1-2 comorbidities: pulmonary HTN, a-flutter, osteoporosis, severe degenerative scoliosis are also affecting patient's functional outcome.   REHAB POTENTIAL: Good  CLINICAL DECISION MAKING: Evolving/moderate complexity  EVALUATION COMPLEXITY: Moderate   GOALS: Goals reviewed with patient? Yes  SHORT TERM GOALS: Target date: 11/27/23  Pt will journal and discover at least 1 activity to return to that she enjoys Baseline: Goal status: met, watching movies with neighbors 3/7  2.  Pt will strive to eat consistent small meals throughout the day to initiate a bowel routine Baseline:  Goal status: met 4/4, most days  3.  Pt will report 20% less pain around abdomen and trunk to allow for comfort with small meals Baseline:  Goal status: met for most days 4/4    LONG TERM GOALS: Target date: 08/05/24  Pt will return to walking program at least 2x/week for multi-system health benefits. Baseline:  Goal status: will walk one lap around neighborhood 1-2x/day and participate in short errands - ONGOING 10/24  2.  Pt will be ind with HEP for gluteal and pelvic floor strength to reduce incidence of bowel leakage. Baseline:  Goal status: MET 10/24  3.  Pt will establish nighttime routine including Curable app, medidation, breathing and stretching routine to improve  sleep stretches. Baseline:  Goal status: MET 5/30, has established consistent routine  4.  Pt will report at least 50% less pain with daily tasks and eating secondary to reduced soft tissue compression surrounding scoliosis. Baseline:  Goal status: ONGOING 10/24 reports 50% improvement a few days of the week    PLAN:  PT FREQUENCY: 1-2x/week  PT DURATION: 8 weeks  PLANNED INTERVENTIONS: 97110-Therapeutic exercises, 97530- Therapeutic activity, 97112- Neuromuscular re-education, 97535- Self Care, [id number]- Manual therapy, Dry Needling, Joint mobilization, Spinal mobilization, Cryotherapy, and Moist heat.  PLAN FOR NEXT SESSION: 2 more visits and then d/c, continue to review PF HEP as needed to reinforce technique and purpose, floor transfer for HEP stretches/supine therex, postural strength, incorporate PF education and training with therex, ambulation for endurance, manual therapy  Ellanie Oppedisano, PT 07/29/24 11:56 AM

## 2024-08-05 ENCOUNTER — Encounter: Payer: Self-pay | Admitting: Physical Therapy

## 2024-08-05 ENCOUNTER — Telehealth: Payer: Self-pay | Admitting: Cardiology

## 2024-08-05 ENCOUNTER — Ambulatory Visit: Attending: Family Medicine | Admitting: Physical Therapy

## 2024-08-05 DIAGNOSIS — M546 Pain in thoracic spine: Secondary | ICD-10-CM | POA: Diagnosis present

## 2024-08-05 DIAGNOSIS — M545 Low back pain, unspecified: Secondary | ICD-10-CM | POA: Insufficient documentation

## 2024-08-05 DIAGNOSIS — M62830 Muscle spasm of back: Secondary | ICD-10-CM | POA: Diagnosis present

## 2024-08-05 DIAGNOSIS — M6281 Muscle weakness (generalized): Secondary | ICD-10-CM | POA: Diagnosis present

## 2024-08-05 DIAGNOSIS — R293 Abnormal posture: Secondary | ICD-10-CM | POA: Diagnosis present

## 2024-08-05 DIAGNOSIS — M4125 Other idiopathic scoliosis, thoracolumbar region: Secondary | ICD-10-CM | POA: Insufficient documentation

## 2024-08-05 DIAGNOSIS — M542 Cervicalgia: Secondary | ICD-10-CM | POA: Insufficient documentation

## 2024-08-05 DIAGNOSIS — G8929 Other chronic pain: Secondary | ICD-10-CM | POA: Insufficient documentation

## 2024-08-05 NOTE — Therapy (Signed)
 OUTPATIENT PHYSICAL THERAPY THORACOLUMBAR TREATMENT    Patient Name: AMYE GREGO MRN: [id number] DOB:10/01/1931, 89 y.o., female Today's Date: 08/05/2024  END OF SESSION:  PT End of Session - 08/05/24 1150     Visit Number 29    Date for Recertification  08/05/24    Authorization Type Medicare part A/B - KX at visit 15    PT Start Time 1100    PT Stop Time 1147    PT Time Calculation (min) 47 min    Activity Tolerance Patient tolerated treatment well    Behavior During Therapy [facility] for tasks assessed/performed                Past Medical History:  Diagnosis Date   Arrhythmia    History of SVT with documented PVC'S and  PAC'S  12/08/12 Nuc stress test normal LV EF 74%  Event Monitor  12/01/12-01/03/13   Atrial flutter (HCC)    Celiac disease    treated by Dr. Luis   GERD (gastroesophageal reflux disease)    Hypertension    Intervertebral disc stenosis of neural canal of cervical region    Irregular heart beat 11/30/2012   ECHO-EF 60-65%   Osteoporosis    PMR (polymyalgia rheumatica)    Dr. Balinda; pt states she was diagnosed 10-15 years ago, not treated at this time or any issues that she is aware of.   Scoliosis    Scoliosis    Sleep apnea 10/02/11 [facility]   Sleep study AHI -total sleep 10.3/hr  64.0/ hr during REM sleep.RDI 22.8/hr during total sleep 64.0/hr during REM sleep The lowest O2 sat during Non-REM and REM sleep was 86% and 88% respectively. 04/08/12 CPAP/BIPAP titration study [facility]   Past Surgical History:  Procedure Laterality Date   ANAL RECTAL MANOMETRY N/A 09/16/2023   Procedure: ANO RECTAL MANOMETRY;  Surgeon: Burnette Fallow, MD;  Location: [facility];  Service: Gastroenterology;  Laterality: N/A;   APPENDECTOMY     ruptured at age 76 and had surgery   CARDIAC CATHETERIZATION  01/27/06   CARDIOVERSION N/A 01/08/2022   Procedure: CARDIOVERSION;  Surgeon: Hobart Powell BRAVO, MD;  Location: [facility]  ENDOSCOPY;  Service: Cardiovascular;  Laterality: N/A;   cataract surgery  2015   Dr. Cleatus; March & April 2015   Patient Active Problem List   Diagnosis Date Noted   DOE (dyspnea on exertion) 05/30/2023   Pleural effusion 05/30/2023   Radon exposure 05/30/2023   Secondary hypercoagulable state 12/12/2021   Hyponatremia 12/04/2021   Acute CHF (congestive heart failure) (HCC) 12/02/2021   Chronic diastolic heart failure (HCC) 12/02/2021   Atrial fibrillation with rapid ventricular response (HCC) 12/02/2021   Essential hypertension 12/02/2021   GERD without esophagitis 12/02/2021   Obstructive sleep apnea 12/02/2021   Interstitial lung disease (HCC) 12/02/2021   History of pulmonary embolism 12/02/2021   Pulmonary embolism (HCC) 11/06/2021   Bronchiectasis (HCC) 11/06/2021   Deviated septum 05/08/2020   Mass of subcutaneous tissue 05/02/2020   Vertigo 06/16/2019   Pulmonary hypertension, unspecified (HCC) 04/14/2019   Ischemic colitis 04/14/2019   Migraine with aura and without status migrainosus, not intractable 11/08/2018   Degeneration of lumbar intervertebral disc 10/14/2018   Hoarseness of voice 03/04/2018   Abdominal pain 07/01/2017   Diverticulitis, colon    Metabolic acidosis, increased anion gap    Constipation 04/14/2017   Lumbar hernia 04/14/2017   Presbycusis of both ears 01/10/2017   Tinnitus aurium, bilateral 01/10/2017  Gastroesophageal reflux disease 08/20/2016   Hemoptysis 08/20/2016   Obstructive sleep apnea of adult 08/20/2016   Rhinitis, chronic 08/20/2016   Throat pain in adult 08/20/2016   Fatigue 12/23/2015   Sciatica of right side 10/06/2015   History of migraine headaches 10/06/2015   Frequent PVCs 12/28/2013   Premature atrial contractions 12/28/2013   PSVT (paroxysmal supraventricular tachycardia) (HCC) 12/28/2013   Heart palpitations 07/13/2013   Sleep apnea 04/11/2013   Scoliosis 04/11/2013   Atypical atrial flutter (HCC) 11/29/2012    Chest pain, atypical 11/29/2012   Fibromyalgia syndrome 11/29/2012   Chronic steroid use 11/29/2012    PCP: Okey Carlin Redbird, MD  REFERRING PROVIDER: Okey Carlin Redbird, MD  REFERRING DIAG: M41.9 (ICD-10-CM) - Scoliosis  Rationale for Evaluation and Treatment: Rehabilitation  THERAPY DIAG:  Abnormal posture  Cervicalgia  Other idiopathic scoliosis, thoracolumbar region  Pain in thoracic spine  Muscle spasm of back  Chronic bilateral low back pain without sciatica  Muscle weakness (generalized)  ONSET DATE: chronic, years of progressive pain from degenerative scoliosis  SUBJECTIVE:                                                                                                                                                                                           SUBJECTIVE STATEMENT: I am down and out.  The PT has helped me so much though.  My kids want me to move into a retirement facility and I don't want to.   PERTINENT HISTORY:  Degenerative scoliosis, complex medical history including pulmonary HTN, frequent PVCs and a-flutter, history of cardioversion, osteoporosis Has been SOB with activity On waitlist to get into cardiologist sooner   PAIN:  PAIN:  Are you having pain? Yes NPRS scale: 6/10 Pain location: abdomen, left side of trunk, low back, intermittent bil hips/buttock/knee, Rt hip, neck bil Pain orientation: Other: see above  PAIN TYPE: aching, sharp, and tight Pain description: intermittent and constant  Aggravating factors: varies Relieving factors: unsure   PRECAUTIONS: Other: osteoporosis  RED FLAGS: None   WEIGHT BEARING RESTRICTIONS: No  FALLS:  Has patient fallen in last 6 months? No  LIVING ENVIRONMENT: Lives with: lives with their family and lives alone Lives in: House/apartment Stairs: No Has following equipment at home: has cane and walker but does not use  OCCUPATION: retired  PLOF: Independent with basic ADLs,  Independent with household mobility without device, Independent with community mobility without device, and Needs assistance with homemaking  PATIENT GOALS:  be able to eat, improve strength for bowel control, feel less pain and more energy to expand social outlets and walking  NEXT MD VISIT: as  needed  OBJECTIVE:  Note: Objective measures were completed at Evaluation unless otherwise noted.    COGNITION: Overall cognitive status: intact with improved outlook     SENSATION: WFL  MUSCLE LENGTH: 07/22/24: see palpation for updates 04/29/24: see palpation for updates 5/30: see palpation for updates 4/4 See palpation for updates Eval: Adaptive shortening of Lt obliques, QL, lat secondary to rigid scoliosis  POSTURE: rounded shoulders, forward head, decreased lumbar lordosis, and increased thoracic kyphosis, severe degenerative scoliosis with Lt SB/Rt Rot, left ribcage in near contact with left ilium  PALPATION: 07/22/24: Maintaining improved soft tissue extensibility along Lt side of trunk, improving lateral costal expansion along Lt side of trunk, reducing trigger points in bil gluteals  04/29/24: Pt will improved soft tissue extensibility along Lt side of trunk, improving lateral costal expansion along Lt side of trunk, reducing trigger points in bil gluteals, presence of painful trigger points in Rt upper trap, levator and rhomboids today  5/30: Improved soft tissue extensibility along Lt side of trunk, improving lateral costal expansion along Lt side of trunk, improving gut sounds and motility along Lt side of trunk, reducing trigger points in Lt lat and shoulder  4/4:  Improving mobility of soft tissues along Lt side of trunk allowing reduced pressure on bowels, bladder, stomach to allow improved bowel movements, control of bowel and bladder Improving lateral costal expansion along Lt side of trunk for improved depth of inhale and stretching of intercostals  Eval:  Spasm present  in bil upper traps Fascial and muscle mobility restriction along Lt side of trunk due to adaptive shortening from scoliosis: obliques, QL, hip flexors, lat  LUMBAR ROM:   AROM eval 01/01/24 02/26/24 04/29/24 07/22/24  Flexion full full full full full  Extension NT NT NT NT NT  Right lateral flexion 20% 30% 40% 50% 75%  Left lateral flexion 40% 50% 75% 75% 75%  Right rotation 40% 50% 50% 75% 75%  Left rotation 20% 20% 30% 50% 75%   (Blank rows = not tested)  LOWER EXTREMITY ROM:    WFL, flexion is full with good hamstring length   LOWER EXTREMITY MMT:   07/22/24: LE grossly 4+/5, scapular stabilizers 4+/5 04/29/24: hip abd and ER 4+/5, scapular stabilizers 4/5 4/4: hip abd and ER 4/5 Eval: Grossly 4/5 throughout, 4-/5 hip abduction, ER  FUNCTIONAL TESTS:    GAIT: Distance walked: within clinic Assistive device utilized: None Level of assistance: Modified independence Comments: short stride length  TREATMENT DATE:  08/05/24 Rt SL manual therapy - broadening and elongation of Lt obliques, lat, QL, intercostals, paraspinals from cervical to lumbar, gluteals, hip flexors.  Gentle sacral distraction. Rt hip deep tissue massage. Supine piriformis 3x10 Supine HS stretch with ankle pumps 2x30 Supine bicycle x20 SKTC 3x10 bil SLR x10 each Supine bridges x10 Supine red tband bil horiz abd x10 and bil ER x10 for postural strength Standing bil red tband row x20 Seated STM to bil pectorals, bil upper traps, cervical paraspinals  07/29/24 Rt SL manual therapy - broadening and elongation of Lt obliques, lat, QL, intercostals, paraspinals from cervical to lumbar, gluteals, hip flexors.  Gentle sacral distraction. Rt hip deep tissue massage. Supine piriformis 3x10 Supine HS stretch with ankle pumps 2x30 Supine bicycle x20 SKTC 3x10 bil SLR x10 each Supine bridges x10 Supine red tband bil horiz abd x10 and bil ER x10 for postural strength Standing bil red tband row x20 Seated STM  to bil pectorals, bil upper traps, cervical paraspinals  PATIENT EDUCATION:  Education details: Pt education (see above) Person educated: Patient Education method: Explanation Education comprehension: verbalized understanding  HOME EXERCISE PROGRAM:  Access Code: [email or web address] URL: [email or web address] Date: 07/08/2024 Prepared by: Orvil Maloni Musleh  Exercises - Supine Figure 4 Piriformis Stretch  - 1 x daily - 7 x weekly - 1 sets - 3 reps - 10 hold - Supine Single Knee to Chest Stretch  - 1 x daily - 7 x weekly - 1 sets - 3 reps - 10 hold - Supine Core Bicycle  - 1 x daily - 7 x weekly - 2 sets - 20 reps - Supine Hamstring Stretch  - 1 x daily - 7 x weekly - 1 sets - 2 reps - 30 hold - Active Straight Leg Raise with Quad Set  - 1 x daily - 7 x weekly - 2 sets - 10 reps - Bridge with Hip Abduction and Resistance - Ground Touches  - 1 x daily - 7 x weekly - 2 sets - 10 reps  ASSESSMENT:  CLINICAL IMPRESSION: Pt has been more compliant with HEP.  She has reached a plateau in her progress so we will d/c at this point.  PT continues to encourage her to get a rollator to support more ambulation for endurance.    OBJECTIVE IMPAIRMENTS: cardiopulmonary status limiting activity, decreased activity tolerance, decreased coordination, decreased endurance, decreased mobility, decreased ROM, decreased strength, hypomobility, increased fascial restrictions, increased muscle spasms, impaired flexibility, impaired tone, improper body mechanics, postural dysfunction, and pain.   ACTIVITY LIMITATIONS: carrying, lifting, bending, sitting, standing, squatting, sleeping, stairs, transfers, bed mobility, continence, bathing, dressing, and locomotion level  PARTICIPATION LIMITATIONS: meal prep, cleaning, laundry, shopping, and community activity  PERSONAL FACTORS: Age, Behavior pattern, Time since onset of  injury/illness/exacerbation, and 1-2 comorbidities: pulmonary HTN, a-flutter, osteoporosis, severe degenerative scoliosis are also affecting patient's functional outcome.   REHAB POTENTIAL: Good  CLINICAL DECISION MAKING: Evolving/moderate complexity  EVALUATION COMPLEXITY: Moderate   GOALS: Goals reviewed with patient? Yes  SHORT TERM GOALS: Target date: 11/27/23  Pt will journal and discover at least 1 activity to return to that she enjoys Baseline: Goal status: met, watching movies with neighbors 3/7  2.  Pt will strive to eat consistent small meals throughout the day to initiate a bowel routine Baseline:  Goal status: met 4/4, most days  3.  Pt will report 20% less pain around abdomen and trunk to allow for comfort with small meals Baseline:  Goal status: met for most days 4/4    LONG TERM GOALS: Target date: 08/05/24  Pt will return to walking program at least 2x/week for multi-system health benefits. Baseline:  Goal status: will walk one lap around neighborhood 1-2x/day and participate in short errands - MET for some weeks 11/7   2.  Pt will be ind with HEP for gluteal and pelvic floor strength to reduce incidence of bowel leakage. Baseline:  Goal status: MET 10/24  3.  Pt will establish nighttime routine including Curable app, medidation, breathing and stretching routine to improve sleep stretches. Baseline:  Goal status: MET 5/30, has established consistent routine  4.  Pt will report at least 50% less pain with daily tasks and eating secondary to reduced soft tissue compression surrounding scoliosis. Baseline:  Goal status: MET 11/7    PLAN:  PT FREQUENCY: 1-2x/week  PT DURATION: 8 weeks  PLANNED INTERVENTIONS: 97110-Therapeutic exercises, 97530- Therapeutic activity, 97112- Neuromuscular re-education, 97535- Self Care, 02859- Manual therapy, Dry Needling, Joint mobilization, Spinal mobilization, Cryotherapy,  and Moist heat.  PLAN FOR NEXT SESSION: D/C  TO HEP    PHYSICAL THERAPY DISCHARGE SUMMARY  Visits from Start of Care: 29  Current functional level related to goals / functional outcomes: SEE ABOVE   Remaining deficits: SEE ABOVE   Education / Equipment: HEP   Patient agrees to discharge. Patient goals were partially met. Patient is being discharged due to lack of progress.  Davone Shinault, PT 08/05/24 11:56 AM

## 2024-08-05 NOTE — Telephone Encounter (Signed)
   Pt c/o of Chest Pain: STAT if active CP, including tightness, pressure, jaw pain, radiating pain to shoulder/upper arm/back, CP unrelieved by Nitro. Symptoms reported of SOB, nausea, vomiting, sweating.  1. Are you having CP right now? Yes   2. Are you experiencing any other symptoms (ex. SOB, nausea, vomiting, sweating)? SOB   3. Is your CP continuous or coming and going? Come and go   4. Have you taken Nitroglycerin? No   5. How long have you been experiencing CP? Months    6. If NO CP at time of call then end call with telling Pt to call back or call 911 if Chest pain returns prior to return call from triage team.  Pt c/o Shortness Of Breath: STAT if SOB developed within the last 24 hours or pt is noticeably SOB on the phone  1. Are you currently SOB (can you hear that pt is SOB on the phone)? Yes  2. How long have you been experiencing SOB? months  3. Are you SOB when sitting or when up moving around? Moving around  4. Are you currently experiencing any other symptoms? No  Call transferred

## 2024-08-05 NOTE — Telephone Encounter (Signed)
 Call was transferred secondary to having shortness of breath and chest pains When I asked the patient what exactly was going on she stated she could no go over all of this again Tried to ask if she was having chest pains, sob etc Patient stated she could not go over everything again just wanted an appointment before March and if I couldn't help her fine. She then hung up and ended the call

## 2024-08-12 DIAGNOSIS — F429 Obsessive-compulsive disorder, unspecified: Secondary | ICD-10-CM | POA: Diagnosis not present

## 2024-08-12 DIAGNOSIS — I4891 Unspecified atrial fibrillation: Secondary | ICD-10-CM | POA: Diagnosis not present

## 2024-08-12 DIAGNOSIS — G479 Sleep disorder, unspecified: Secondary | ICD-10-CM | POA: Diagnosis not present

## 2024-08-12 DIAGNOSIS — I5032 Chronic diastolic (congestive) heart failure: Secondary | ICD-10-CM | POA: Diagnosis not present

## 2024-09-07 ENCOUNTER — Telehealth: Payer: Self-pay

## 2024-09-07 NOTE — Telephone Encounter (Signed)
 Left VM for patient to return call to office to set up new pt appt.

## 2024-09-12 ENCOUNTER — Ambulatory Visit (HOSPITAL_BASED_OUTPATIENT_CLINIC_OR_DEPARTMENT_OTHER): Admitting: Pulmonary Disease

## 2024-09-13 DIAGNOSIS — L501 Idiopathic urticaria: Secondary | ICD-10-CM | POA: Diagnosis not present

## 2024-09-13 DIAGNOSIS — L309 Dermatitis, unspecified: Secondary | ICD-10-CM | POA: Diagnosis not present

## 2024-09-14 ENCOUNTER — Telehealth: Payer: Self-pay | Admitting: Cardiology

## 2024-09-14 ENCOUNTER — Telehealth: Payer: Self-pay | Admitting: Pharmacy Technician

## 2024-09-14 ENCOUNTER — Other Ambulatory Visit (HOSPITAL_COMMUNITY): Payer: Self-pay

## 2024-09-14 NOTE — Telephone Encounter (Signed)
 Patient Advocate Encounter   The patient was approved for a [employer] grant that will help cover the cost of eliquis   Total amount awarded, 7500.  Effective: 08/15/24 - 08/14/25   APW:[id number] ERW:[id number] Group:[id number] PI:[id number]  [employer] ID: [id number]   Pharmacy provided with approval and processing information. Patient informed via telephone   I called [facility] and this is now free. I called the patient and had to leave a message on both numbers

## 2024-09-14 NOTE — Telephone Encounter (Signed)
 Pt stating Eliquis  is too expensive for her to get. Please advise.

## 2024-09-15 NOTE — Telephone Encounter (Signed)
 The patient called back and said she only got 30 tabs of eliquis   I called [facility] and they are going to give her the rest of her tablets to total qty 60   I called the patient back and made her aware

## 2024-09-16 ENCOUNTER — Telehealth: Payer: Self-pay | Admitting: Pharmacy Technician

## 2024-09-16 NOTE — Telephone Encounter (Signed)
 Returned patient call. Verified by 2 identifiers.   Reports rash ongoing for >1 year. She assumed it was part of the Eliquis  or her other cardiac medications. The rash includes under her breasts, back of legs, trunk. Now with new rash to her back described as red pimple looking which itches. She saw her OB at [facility] earlier this week for vaginal itching and noted the rash, she was given nystatin-triamcinolone  which she has done one application.  Discussed Eliquis  <1% instance of rash and unlikely as she has been on this medication since 2023. Scheduled for sooner follow up 09/30/24 with Dr. Lonni. She inquires about stopping by for us  to look at rash without an appointment, discussed our office does not have capacity for drop by visits. If worsens prior to 09/30/24 visit, recommend evaluation with PCP. Again reviewed that low suspicion rash is related to her Eliquis .   Janet Nguyen S Timur Nibert, NP

## 2024-09-16 NOTE — Telephone Encounter (Signed)
 Hi, patient just called and is asking someone to call her to see if she can get a sooner appt. She said she is covered in a rash (has been for a couple of years).     I had helped her with her eliquis  and she kept my number. She said she doesn't know if it is from the eliquis  or something else.

## 2024-09-30 ENCOUNTER — Ambulatory Visit (HOSPITAL_BASED_OUTPATIENT_CLINIC_OR_DEPARTMENT_OTHER): Admitting: Cardiology

## 2024-09-30 ENCOUNTER — Encounter (HOSPITAL_BASED_OUTPATIENT_CLINIC_OR_DEPARTMENT_OTHER): Payer: Self-pay | Admitting: Cardiology

## 2024-09-30 VITALS — BP 100/62 | HR 82 | Ht 60.0 in | Wt 112.0 lb

## 2024-09-30 DIAGNOSIS — I340 Nonrheumatic mitral (valve) insufficiency: Secondary | ICD-10-CM | POA: Diagnosis not present

## 2024-09-30 DIAGNOSIS — R21 Rash and other nonspecific skin eruption: Secondary | ICD-10-CM | POA: Diagnosis not present

## 2024-09-30 DIAGNOSIS — R0602 Shortness of breath: Secondary | ICD-10-CM

## 2024-09-30 DIAGNOSIS — D6869 Other thrombophilia: Secondary | ICD-10-CM

## 2024-09-30 DIAGNOSIS — I4821 Permanent atrial fibrillation: Secondary | ICD-10-CM | POA: Diagnosis not present

## 2024-09-30 DIAGNOSIS — Z7901 Long term (current) use of anticoagulants: Secondary | ICD-10-CM

## 2024-09-30 DIAGNOSIS — I5032 Chronic diastolic (congestive) heart failure: Secondary | ICD-10-CM | POA: Diagnosis not present

## 2024-09-30 NOTE — Progress Notes (Signed)
 " Cardiology Office Note:  .   Date:  09/30/2024  ID:  Falana, Clagg [birth year], MRN [id number] PCP: Okey Carlin Redbird, MD   HeartCare Providers Cardiologist:  Shelda Bruckner, MD Cardiology APP:  Madie Jon Garre, [place] {  History of Present Illness: .   Janet Nguyen is a 89 y.o. female with PMH SVT, PACs, PVCs, permanent atrial fibrillation, PE 2023. She was previously followed by Dr. Burnard and established with me on 02/08/24.  Pertinent CV history: afib diagnosed 2023, Not a candidate for amiodarone given underlying lung disease. Was planned for tikosyn but cancelled admission. Currently rate controlled.   Today: Since our last visit, we were able to get her patient assistance to cover her apixaban . She called 08/05/24 reporting chest pain and shortness of breath, see phone note re: recommendations. She called on 09/16/24 reported a rash that has been ongoing for over a year across multiple areas of her body. She is concerned it could be the apixaban , see phone note.  Notes that she has had small circular plaques intermittently across her body, largely under her breasts and in her groin. In the last few months, has developed pimples over her right back/right arm in the last few months. Reviewed today, these are largely excoriated with scabbing diffusely. She feels this is her apixaban . Given location, appearance, looks more like a contact irritant. Notes that she lays on her right side due to scoliosis. Discussed potential of changing anticoagulation, see below. Also discussed considering dermatology evaluation. She would like to pursue this first before changing medications.   She has had a lot of stress in the last six months. Has had more shortness of breath, notes that she has had less activity recently as well. Follows with Dr. Kassie in pulmonary for dyspnea and bronchiectasis. Feels that she   Notes that her veins seem to be protruding more than before,  asking if this is due to medications.  Does not currently have a PCP as Dr. Okey is retiring. Has seen Dr. Mat at [facility] recently. Establishing with Dr. Wendolyn in April.   Has chest wall sensitivity over left chest wall, sometimes catches/pulls, feels better with tylenol .  ROS: Denies PND, orthopnea, change in LE edema or unexpected weight gain. No syncope or palpitations. ROS otherwise negative except as noted.   Studies Reviewed: SABRA    EKG:  EKG Interpretation Date/Time:  Friday September 30 2024 11:56:37 EST Ventricular Rate:  82 PR Interval:    QRS Duration:  84 QT Interval:  360 QTC Calculation: 420 R Axis:   -77  Text Interpretation: Atrial fibrillation Left anterior fascicular block Minimal voltage criteria for LVH, may be normal variant Confirmed by Bruckner Shelda [id number]) on 09/30/2024 12:54:30 PM    Physical Exam:   VS:  BP 100/62 (Patient Position: Sitting, Cuff Size: Small)   Pulse 82   Ht 5' (1.524 m)   Wt 112 lb (50.8 kg)   SpO2 94%   BMI 21.87 kg/m    Wt Readings from Last 3 Encounters:  09/30/24 112 lb (50.8 kg)  07/13/24 111 lb (50.3 kg)  05/24/24 113 lb 3.2 oz (51.3 kg)    GEN: Well nourished, well developed in no acute distress HEENT: Normal, moist mucous membranes NECK: JVD not visualized sitting upright CARDIAC: irregularly irregular rhythm, normal S1 and S2, no rubs or gallops. 2/6 holosystolic murmur. VASCULAR: Radial and DP pulses 2+ bilaterally. No carotid bruits RESPIRATORY:  bronchiectasis but no wheezing or  rales ABDOMEN: Soft, non-tender, non-distended MUSCULOSKELETAL:  Ambulates independently SKIN: Warm and dry, trivial bilateral LE edema, R foot >L. Scattered papules with excoriations and scab across right upper back, right arm. Prominent varicose and spider veins in lower extremities NEUROLOGIC:  Alert and oriented x 3. No focal neuro deficits noted. PSYCHIATRIC:  Normal affect    ASSESSMENT AND PLAN: .    Atrial  fibrillation, permanent PVCs, history of PACs and pSVT -CHA2DS2/VAS Stroke Risk Points=7 -now treated as permanent, rate controlled -continue digoxin , diltiazem , and metoprolol  as currently ordered -continue apixaban  2.5 mg twice daily, dose reduced given age and weight. Discussed alternatives given her concern about rash. Discussed xarelto, coumadin as alternatives. After shared decision making, she will try to see dermatologist first before changing medication -she also has a history of PE as indication for anticoagulation  Chronic shortness of breath Bronchiectasis, scoliosis Moderate MR, myxomatous mitral valve Elevated PA pressures Chronic diastolic heart failure -following with Dr. Kassie -continue torsemide  20 mg in the morning and 20 mg in the afternoon.  -last echo from 03/2023 reviewed. Discussed rechecking TTE vs. Performing TEE to further evaluate the mitral valve. Discussed potential for mitraclip if severe and anatomy is favorable, which she would consider. She does not think she would want surgery. After shared decision making, will start with TTE and then consider TEE if mitral valve is concerning for severe regurgitation.  Dispo: 6 mos with me or APP  Total time of encounter: I spent 50 minutes dedicated to the care of this patient on the date of this encounter to include pre-visit review of records, face-to-face time with the patient discussing conditions above, and clinical documentation with the electronic health record. We specifically spent time today discussing rash, apixaban , medications, mitral regurgitation and evaluation, shortness of breath as noted above.   Signed, Shelda Bruckner, MD   Shelda Bruckner, MD, PhD, [facility] [place]  [facility]    Heart & Vascular at [facility] at [facility] [address] [place], [place] [address] [phone]   "

## 2024-09-30 NOTE — Patient Instructions (Addendum)
" °  We will get an echocardiogram (ultrasound) of your heart to look at that valve better. Based on this, we will let you know if we need to do more testing. If everything looks stable, we will see you back in 6 mos. Please check in with the dermatologist about the rash. "

## 2024-10-05 ENCOUNTER — Ambulatory Visit (INDEPENDENT_AMBULATORY_CARE_PROVIDER_SITE_OTHER)

## 2024-10-05 DIAGNOSIS — R0602 Shortness of breath: Secondary | ICD-10-CM | POA: Diagnosis not present

## 2024-10-05 DIAGNOSIS — I340 Nonrheumatic mitral (valve) insufficiency: Secondary | ICD-10-CM

## 2024-10-05 LAB — ECHOCARDIOGRAM COMPLETE
MV M vel: 3.73 m/s
MV Peak grad: 55.7 mmHg
P 1/2 time: 303 ms
Radius: 0.7 cm

## 2024-10-10 ENCOUNTER — Telehealth: Payer: Self-pay | Admitting: Physical Therapy

## 2024-10-10 NOTE — Telephone Encounter (Signed)
 Spoke with [name], PT about her concerns related to this pt and the pt's dependency on her. Pt was discharged in 07/2024 (2 months ago) after 29 visits and there has been no change in medical status - she reports wanting to come back for more massage. Patient is well known to this practice as she has been treated off an on for the same issues for 7 years. She refuses suggestions to increase activity level or participate in therapeutic exercise, reporting she is unable to do it due to pain.  Phoned the pt to discuss her upcoming evaluation: I called to explain that she would need to see a different PT at her appt on 1/26. I told her we can no longer see her for only massage as it is not considered medically necessary and does not allow us  to progress her which is required by her insurance company in order to continue PT. She hung up on me. She has created a very dependent and demanding relationship with Orvil that is uncomfortable for Orvil and the pt is inappropriate in her demands I have moved any appts she had scheduled to other PTs  We will again try to reach her before her upcoming appt to inform her of the change in her schedule but if she calls back and refuses to see another PT, we will cancel her appt.  Eward Wonda Sharps, [place] 10/10/2024 10:47 AM

## 2024-10-12 ENCOUNTER — Telehealth: Payer: Self-pay

## 2024-10-12 NOTE — Telephone Encounter (Signed)
 Copied from CRM [id number]. Topic: Appointments - Scheduling Inquiry for Clinic >> Oct 12, 2024 11:28 AM Zy'onna H wrote: Reason for CRM: Physician - Arland from [facility] called in on behalf of this patient regarding her upcoming appt with New PCP Wendolyn Jenkins Jansky (on 12/28/2024).   Advised by previous Family physician   Please Eleanore Arland Phone: [phone]  Called and left message for return call smk

## 2024-10-17 ENCOUNTER — Telehealth: Payer: Self-pay

## 2024-10-17 ENCOUNTER — Ambulatory Visit (HOSPITAL_BASED_OUTPATIENT_CLINIC_OR_DEPARTMENT_OTHER): Payer: Self-pay | Admitting: Cardiology

## 2024-10-17 NOTE — Telephone Encounter (Unsigned)
 Copied from CRM [id number]. Topic: Appointments - Scheduling Inquiry for Clinic >> Oct 17, 2024  1:11 PM Drema MATSU wrote: Reason for CRM: Darice is requesting a callback from Dr. Enos nurse regarding pt appt. She wants to know if pt can be seen sooner.

## 2024-10-21 ENCOUNTER — Telehealth: Payer: Self-pay | Admitting: Physical Therapy

## 2024-10-21 NOTE — Telephone Encounter (Signed)
 Phoned Ms. Geary after multiple attempts to reach her playing phone tag. She had difficulty letting me speak to her to explain the need for her to see another therapist because she kept interrupting telling me that what she needs is for Orvil to treat her. She kept insisting that I don't care and Johanna doesn't care and if we don't want to treat her, that's fine.  I asked if I could speak for a moment to try to explain and she said yes. Then I began to explain that we need to be able to show meaningful progress in a reasonable amount of time with treatment in order to continue PT and that Orvil got to a point where she wasn't able to show progression or functional gains (after her most recent 29 visits) and requested that she see another PT to see if some different type of treatment could be done. The pt had previously insisted on massage therapy and Johanna had recommended that she see a massage therapist for ongoing care, which the pt declined. As I was trying to explain that we would be happy to resume treatment but that it would need to be with a different therapist, Ms. Hetzer said You don't care. Goodbye. Eward Wonda Sharps, [place] 10/21/24 12:44 PM

## 2024-10-24 ENCOUNTER — Ambulatory Visit: Admitting: Rehabilitative and Restorative Service Providers"

## 2024-10-25 ENCOUNTER — Other Ambulatory Visit: Payer: Self-pay | Admitting: Cardiology

## 2024-10-25 DIAGNOSIS — I10 Essential (primary) hypertension: Secondary | ICD-10-CM

## 2024-10-26 ENCOUNTER — Telehealth: Payer: Self-pay | Admitting: Pharmacy Technician

## 2024-10-26 ENCOUNTER — Telehealth: Payer: Self-pay | Admitting: Physical Therapy

## 2024-10-26 DIAGNOSIS — I10 Essential (primary) hypertension: Secondary | ICD-10-CM

## 2024-10-26 MED ORDER — METOPROLOL SUCCINATE ER 100 MG PO TB24: 100.0000 mg | ORAL_TABLET | Freq: Two times a day (BID) | ORAL | 1 refills | Status: DC

## 2024-10-26 NOTE — Telephone Encounter (Signed)
 Janet Nguyen called our office after having spoken to my supervisor Eward Sharps, PT on two separate occasions (see previous telephone encounters) requesting continued explanation for why her appointments were moved to another therapist and why she couldn't be seen by me, her previous PT.  I have a long history of treating this patient, and over multiple long episodes of care, she became more and more resistant to performing interventions such as therapeutic exercise or neuromuscular re-ed due to pain.  She expressed that it was massage that helped her.  I returned her call today and explained that I was part of the discussion and decision to move her appointments to another PT because her expectations for treatment with me were falling outside the scope of physical therapy and rather more in the scope of massage therapy.  I had hoped that she could make more progress and change her expectations and outcome under treatment with another PT, but she declined returning to our clinic.  She also declined my offer to provide her with some resources for massage therapists in this area.  She said If it's not Janet Nguyen, I don't want it. She thanked me for all of the help I have given her and said she hoped we would get together socially sometime.  Janet Nguyen, PT 10/26/24 3:08 PM

## 2024-10-26 NOTE — Telephone Encounter (Signed)
 Hi the patient called me and left a message that she is completely out of metoprolol  and is requesting the refill for metoprolol  to be sent to her [facility]. Thank you!

## 2024-10-26 NOTE — Telephone Encounter (Signed)
I called the patient and made her aware.

## 2024-10-26 NOTE — Telephone Encounter (Signed)
 Rx sent to pharmacy

## 2024-10-27 ENCOUNTER — Other Ambulatory Visit (HOSPITAL_COMMUNITY): Payer: Self-pay

## 2024-10-27 MED ORDER — METOPROLOL SUCCINATE ER 50 MG PO TB24: 100.0000 mg | ORAL_TABLET | Freq: Two times a day (BID) | ORAL | 5 refills | Status: AC

## 2024-10-27 NOTE — Addendum Note (Signed)
 Addended by: DARRELL BAREFOOT on: 10/27/2024 10:09 AM   Modules accepted: Orders

## 2024-10-27 NOTE — Telephone Encounter (Signed)
 Patient called back and asked for the metoprolol  xl 50mg  2 tablets bid to be sent in to replace the xl 100mg  daily. She said the 100mg  tablets are too big.

## 2024-10-31 ENCOUNTER — Ambulatory Visit: Admitting: Physical Therapy

## 2024-11-07 ENCOUNTER — Ambulatory Visit: Admitting: Physical Therapy

## 2024-11-14 ENCOUNTER — Ambulatory Visit: Admitting: Physical Therapy

## 2024-11-21 ENCOUNTER — Ambulatory Visit: Admitting: Physical Therapy

## 2024-11-28 ENCOUNTER — Ambulatory Visit (HOSPITAL_BASED_OUTPATIENT_CLINIC_OR_DEPARTMENT_OTHER): Admitting: Cardiology

## 2024-12-28 ENCOUNTER — Ambulatory Visit: Admitting: Family Medicine
